# Patient Record
Sex: Female | Born: 1945 | Race: White | Hispanic: No | Smoking: Never smoker
Health system: Southern US, Community
[De-identification: ages and names within clinical notes are randomized; demographics above are authoritative.]

## PROBLEM LIST (undated history)

## (undated) DIAGNOSIS — B019 Varicella without complication: Secondary | ICD-10-CM

## (undated) DIAGNOSIS — C8290 Follicular lymphoma, unspecified, unspecified site: Secondary | ICD-10-CM

## (undated) DIAGNOSIS — I700 Atherosclerosis of aorta: Secondary | ICD-10-CM

## (undated) DIAGNOSIS — M810 Age-related osteoporosis without current pathological fracture: Secondary | ICD-10-CM

## (undated) DIAGNOSIS — Q676 Pectus excavatum: Secondary | ICD-10-CM

## (undated) DIAGNOSIS — I4892 Unspecified atrial flutter: Secondary | ICD-10-CM

## (undated) DIAGNOSIS — Z7901 Long term (current) use of anticoagulants: Secondary | ICD-10-CM

## (undated) DIAGNOSIS — K635 Polyp of colon: Secondary | ICD-10-CM

## (undated) DIAGNOSIS — Z9841 Cataract extraction status, right eye: Secondary | ICD-10-CM

## (undated) DIAGNOSIS — D6481 Anemia due to antineoplastic chemotherapy: Secondary | ICD-10-CM

## (undated) DIAGNOSIS — E43 Unspecified severe protein-calorie malnutrition: Secondary | ICD-10-CM

## (undated) DIAGNOSIS — D696 Thrombocytopenia, unspecified: Secondary | ICD-10-CM

## (undated) DIAGNOSIS — E785 Hyperlipidemia, unspecified: Secondary | ICD-10-CM

## (undated) DIAGNOSIS — J9809 Other diseases of bronchus, not elsewhere classified: Secondary | ICD-10-CM

## (undated) DIAGNOSIS — K219 Gastro-esophageal reflux disease without esophagitis: Secondary | ICD-10-CM

## (undated) DIAGNOSIS — I2510 Atherosclerotic heart disease of native coronary artery without angina pectoris: Secondary | ICD-10-CM

## (undated) DIAGNOSIS — R222 Localized swelling, mass and lump, trunk: Secondary | ICD-10-CM

## (undated) DIAGNOSIS — Z9289 Personal history of other medical treatment: Secondary | ICD-10-CM

## (undated) DIAGNOSIS — G620 Drug-induced polyneuropathy: Secondary | ICD-10-CM

## (undated) DIAGNOSIS — I4891 Unspecified atrial fibrillation: Secondary | ICD-10-CM

## (undated) DIAGNOSIS — R911 Solitary pulmonary nodule: Secondary | ICD-10-CM

## (undated) DIAGNOSIS — R0600 Dyspnea, unspecified: Secondary | ICD-10-CM

## (undated) DIAGNOSIS — I480 Paroxysmal atrial fibrillation: Secondary | ICD-10-CM

## (undated) DIAGNOSIS — M1990 Unspecified osteoarthritis, unspecified site: Secondary | ICD-10-CM

## (undated) DIAGNOSIS — C20 Malignant neoplasm of rectum: Secondary | ICD-10-CM

## (undated) DIAGNOSIS — H269 Unspecified cataract: Secondary | ICD-10-CM

## (undated) DIAGNOSIS — T451X5A Adverse effect of antineoplastic and immunosuppressive drugs, initial encounter: Secondary | ICD-10-CM

## (undated) HISTORY — DX: Unspecified osteoarthritis, unspecified site: M19.90

## (undated) HISTORY — DX: Personal history of other medical treatment: Z92.89

## (undated) HISTORY — DX: Unspecified cataract: H26.9

## (undated) HISTORY — PX: JOINT REPLACEMENT: SHX530

## (undated) HISTORY — DX: Malignant neoplasm of rectum: C20

## (undated) HISTORY — DX: Unspecified atrial flutter: I48.92

## (undated) HISTORY — DX: Atherosclerotic heart disease of native coronary artery without angina pectoris: I25.10

## (undated) HISTORY — DX: Paroxysmal atrial fibrillation: I48.0

## (undated) HISTORY — PX: TONSILLECTOMY: SUR1361

## (undated) HISTORY — PX: CATARACT EXTRACTION, BILATERAL: SHX1313

## (undated) HISTORY — PX: EYE SURGERY: SHX253

## (undated) MED FILL — Dexamethasone Sodium Phosphate Inj 100 MG/10ML: INTRAMUSCULAR | Qty: 1 | Status: AC

---

## 1990-11-05 HISTORY — PX: TOTAL HIP ARTHROPLASTY: SHX124

## 2012-05-09 DIAGNOSIS — E559 Vitamin D deficiency, unspecified: Secondary | ICD-10-CM | POA: Insufficient documentation

## 2013-03-03 DIAGNOSIS — R928 Other abnormal and inconclusive findings on diagnostic imaging of breast: Secondary | ICD-10-CM | POA: Insufficient documentation

## 2013-04-07 ENCOUNTER — Ambulatory Visit: Payer: Self-pay | Admitting: Surgery

## 2013-05-18 ENCOUNTER — Ambulatory Visit: Payer: Self-pay | Admitting: Surgery

## 2013-06-10 ENCOUNTER — Ambulatory Visit: Payer: Self-pay | Admitting: Oncology

## 2013-06-25 ENCOUNTER — Ambulatory Visit: Payer: Self-pay | Admitting: Oncology

## 2013-06-25 LAB — CBC CANCER CENTER
Basophil #: 0 x10 3/mm (ref 0.0–0.1)
Eosinophil #: 0.2 x10 3/mm (ref 0.0–0.7)
HCT: 39.5 % (ref 35.0–47.0)
HGB: 13.6 g/dL (ref 12.0–16.0)
Lymphocyte %: 23.6 %
MCH: 32 pg (ref 26.0–34.0)
MCHC: 34.5 g/dL (ref 32.0–36.0)
MCV: 93 fL (ref 80–100)

## 2013-06-25 LAB — LACTATE DEHYDROGENASE: LDH: 214 U/L (ref 81–246)

## 2013-07-06 ENCOUNTER — Ambulatory Visit: Payer: Self-pay | Admitting: Oncology

## 2013-09-25 ENCOUNTER — Ambulatory Visit: Payer: Self-pay | Admitting: Oncology

## 2013-09-28 ENCOUNTER — Ambulatory Visit: Payer: Self-pay | Admitting: Oncology

## 2013-09-28 LAB — CBC CANCER CENTER
Basophil #: 0 x10 3/mm (ref 0.0–0.1)
Basophil %: 0.5 %
Eosinophil #: 0.1 x10 3/mm (ref 0.0–0.7)
Lymphocyte %: 22.5 %
MCH: 31 pg (ref 26.0–34.0)
MCHC: 33.4 g/dL (ref 32.0–36.0)
MCV: 93 fL (ref 80–100)
Monocyte #: 0.9 x10 3/mm (ref 0.2–0.9)
Monocyte %: 10.6 %
Neutrophil #: 5.3 x10 3/mm (ref 1.4–6.5)
Neutrophil %: 65.2 %
RBC: 4.41 10*6/uL (ref 3.80–5.20)

## 2013-10-05 ENCOUNTER — Ambulatory Visit: Payer: Self-pay | Admitting: Oncology

## 2014-03-19 DIAGNOSIS — M1710 Unilateral primary osteoarthritis, unspecified knee: Secondary | ICD-10-CM | POA: Insufficient documentation

## 2014-03-19 DIAGNOSIS — E785 Hyperlipidemia, unspecified: Secondary | ICD-10-CM | POA: Insufficient documentation

## 2014-03-19 DIAGNOSIS — M179 Osteoarthritis of knee, unspecified: Secondary | ICD-10-CM | POA: Insufficient documentation

## 2014-07-05 DIAGNOSIS — M170 Bilateral primary osteoarthritis of knee: Secondary | ICD-10-CM | POA: Insufficient documentation

## 2014-11-08 DIAGNOSIS — M810 Age-related osteoporosis without current pathological fracture: Secondary | ICD-10-CM | POA: Insufficient documentation

## 2015-03-01 DIAGNOSIS — E782 Mixed hyperlipidemia: Secondary | ICD-10-CM | POA: Insufficient documentation

## 2015-03-01 DIAGNOSIS — R252 Cramp and spasm: Secondary | ICD-10-CM | POA: Insufficient documentation

## 2015-08-27 DIAGNOSIS — F5102 Adjustment insomnia: Secondary | ICD-10-CM | POA: Insufficient documentation

## 2015-12-14 DIAGNOSIS — M159 Polyosteoarthritis, unspecified: Secondary | ICD-10-CM | POA: Insufficient documentation

## 2015-12-14 DIAGNOSIS — M150 Primary generalized (osteo)arthritis: Secondary | ICD-10-CM

## 2016-06-06 DIAGNOSIS — R222 Localized swelling, mass and lump, trunk: Secondary | ICD-10-CM | POA: Insufficient documentation

## 2016-06-07 ENCOUNTER — Other Ambulatory Visit: Payer: Self-pay | Admitting: Internal Medicine

## 2016-06-07 DIAGNOSIS — R222 Localized swelling, mass and lump, trunk: Secondary | ICD-10-CM

## 2016-06-14 ENCOUNTER — Ambulatory Visit
Admission: RE | Admit: 2016-06-14 | Discharge: 2016-06-14 | Disposition: A | Discharge status: Home / Self Care | Payer: Medicare Other | Source: Ambulatory Visit | Attending: Internal Medicine | Admitting: Internal Medicine

## 2016-06-14 DIAGNOSIS — R590 Localized enlarged lymph nodes: Secondary | ICD-10-CM | POA: Diagnosis not present

## 2016-06-14 DIAGNOSIS — R222 Localized swelling, mass and lump, trunk: Secondary | ICD-10-CM | POA: Diagnosis present

## 2016-07-24 DIAGNOSIS — C8290 Follicular lymphoma, unspecified, unspecified site: Secondary | ICD-10-CM | POA: Insufficient documentation

## 2016-07-24 DIAGNOSIS — C8208 Follicular lymphoma grade I, lymph nodes of multiple sites: Secondary | ICD-10-CM | POA: Insufficient documentation

## 2016-07-24 NOTE — Progress Notes (Signed)
[place]  Telephone:(336) [phone] [phone]  ID: Veronica Boyd OB: 1945/12/17  MR#: [id number]  NOB#:[id number]  Patient Care Team: Glendon Axe, MD as PCP - General (Internal Medicine)  CHIEF COMPLAINT: Follicular lymphoma  INTERVAL HISTORY: Patient is a 70 year old female whose last evaluated in clinic radiation 3 years ago. At that point, she had increasing lymphadenopathy and a biopsy was recommended. Patient refused at that time and was lost to follow-up. Recently she noted increased swelling surrounding her right collarbone and subsequent biopsy was suggestive of follicular lymphoma, but not definitive. She currently feels well and is asymptomatic. She has no neurologic complaints. She denies any fevers, night sweats, or weight loss. She has no chest pain or shortness of breath. She denies any nausea, vomiting, constipation, or diarrhea. She has no urinary complaint. Patient otherwise feels well and offers no further specific complaints.  REVIEW OF SYSTEMS:   Review of Systems  Constitutional: Negative.  Negative for fever, malaise/fatigue and weight loss.  Respiratory: Negative.  Negative for cough and shortness of breath.   Cardiovascular: Negative.  Negative for chest pain and leg swelling.  Gastrointestinal: Negative.  Negative for abdominal pain.  Genitourinary: Negative.   Musculoskeletal: Negative.   Neurological: Negative.   Psychiatric/Behavioral: Negative.  The patient is not nervous/anxious.     As per HPI. Otherwise, a complete review of systems is negative.  PAST MEDICAL HISTORY: Past Medical History:  Diagnosis Date  . Arthritis   . Cataract     PAST SURGICAL HISTORY: History reviewed. No pertinent surgical history.  FAMILY HISTORY: Family History  Problem Relation Age of Onset  . Diabetes Sister   . Lung cancer Brother   . Diabetes Brother   . Basal cell carcinoma Daughter   . Breast cancer Paternal Aunt      ADVANCED DIRECTIVES (Y/N):  N  HEALTH MAINTENANCE: Social History  Substance Use Topics  . Smoking status: Never Smoker  . Smokeless tobacco: Never Used  . Alcohol use Not on file     Colonoscopy:  PAP:  Bone density:  Lipid panel:  No Known Allergies  Current Outpatient Prescriptions  Medication Sig Dispense Refill  . meloxicam (MOBIC) 15 MG tablet Take 1 tablet by mouth daily.    . pantoprazole (PROTONIX) 20 MG tablet Take 1 tablet by mouth daily.    . simvastatin (ZOCOR) 20 MG tablet Take 1 tablet by mouth daily.    Marland Kitchen tiZANidine (ZANAFLEX) 2 MG tablet Take 1 tablet by mouth daily.    . traMADol-acetaminophen (ULTRACET) 37.5-325 MG tablet Take 1 tablet by mouth 2 (two) times daily as needed.     No current facility-administered medications for this visit.     OBJECTIVE: Vitals:   07/26/16 0847  BP: 129/83  Pulse: 73  Resp: 18  Temp: 97.3 F (36.3 C)     Body mass index is 24.13 kg/m.    ECOG FS:0 - Asymptomatic  General: Well-developed, well-nourished, no acute distress. Eyes: Pink conjunctiva, anicteric sclera. HEENT: Normocephalic, moist mucous membranes, clear oropharnyx. Lungs: Clear to auscultation bilaterally. Heart: Regular rate and rhythm. No rubs, murmurs, or gallops. Abdomen: Soft, nontender, nondistended. No organomegaly noted, normoactive bowel sounds. Musculoskeletal: No edema, cyanosis, or clubbing. Neuro: Alert, answering all questions appropriately. Cranial nerves grossly intact. Skin: No rashes or petechiae noted. Psych: Normal affect. Lymphatics: Easily palpable right supraclavicular lymphadenopathy.   LAB RESULTS:  No results found for: NA, K, CL, CO2, GLUCOSE, BUN, CREATININE, CALCIUM, PROT, ALBUMIN, AST, ALT, ALKPHOS,  Prudence Davidson, GFRAA  Lab Results  Component Value Date   WBC 8.1 09/28/2013   NEUTROABS 5.3 09/28/2013   HGB 13.7 09/28/2013   HCT 41.0 09/28/2013   MCV 93 09/28/2013   PLT 229 09/28/2013      STUDIES: No results found.  ASSESSMENT: Follicular lymphoma  PLAN:    1. Follicular lymphoma:  Case discussed with pathology, there was not enough tissue therefore biopsy was only suggestive of follicular lymphoma and not definitive. No grade could be assigned. Given the indolent nature of her lymphadenopathy, the clinical diagnosis is likely accurate. We will likely need additional tissue with excisional biopsy in the near future. Will get a PET scan in the next 1-2 weeks to complete the staging workup. Patient has already refusing bone marrow biopsy. Return to clinic one to 2 days after her PET scan to discuss possible rebiopsy and treatment planning if necessary.  Approximately 30 minutes was spent in discussion of which greater than 50% was consultation.  Patient expressed understanding and was in agreement with this plan. She also understands that She can call clinic at any time with any questions, concerns, or complaints.   No matching staging information was found for the patient.  Lloyd Huger, MD   07/26/2016 9:50 AM

## 2016-07-26 ENCOUNTER — Encounter: Payer: Self-pay | Admitting: Oncology

## 2016-07-26 ENCOUNTER — Inpatient Hospital Stay: Payer: Medicare Other | Attending: Oncology | Admitting: Oncology

## 2016-07-26 ENCOUNTER — Encounter (INDEPENDENT_AMBULATORY_CARE_PROVIDER_SITE_OTHER): Payer: Self-pay

## 2016-07-26 ENCOUNTER — Inpatient Hospital Stay: Payer: Medicare Other

## 2016-07-26 VITALS — BP 129/83 | HR 73 | Temp 97.3°F | Resp 18 | Ht 59.0 in | Wt 119.5 lb

## 2016-07-26 DIAGNOSIS — Z801 Family history of malignant neoplasm of trachea, bronchus and lung: Secondary | ICD-10-CM | POA: Diagnosis not present

## 2016-07-26 DIAGNOSIS — Z79899 Other long term (current) drug therapy: Secondary | ICD-10-CM

## 2016-07-26 DIAGNOSIS — M129 Arthropathy, unspecified: Secondary | ICD-10-CM | POA: Diagnosis not present

## 2016-07-26 DIAGNOSIS — C8290 Follicular lymphoma, unspecified, unspecified site: Secondary | ICD-10-CM | POA: Diagnosis present

## 2016-07-26 DIAGNOSIS — Z803 Family history of malignant neoplasm of breast: Secondary | ICD-10-CM | POA: Diagnosis not present

## 2016-07-26 DIAGNOSIS — Z808 Family history of malignant neoplasm of other organs or systems: Secondary | ICD-10-CM | POA: Diagnosis not present

## 2016-07-26 LAB — COMPREHENSIVE METABOLIC PANEL
ALK PHOS: 61 U/L (ref 38–126)
ALT: 17 U/L (ref 14–54)
AST: 22 U/L (ref 15–41)
Albumin: 4.6 g/dL (ref 3.5–5.0)
Anion gap: 8 (ref 5–15)
BUN: 20 mg/dL (ref 6–20)
CHLORIDE: 103 mmol/L (ref 101–111)
CO2: 25 mmol/L (ref 22–32)
Calcium: 9.4 mg/dL (ref 8.9–10.3)
Creatinine, Ser: 0.81 mg/dL (ref 0.44–1.00)
Glucose, Bld: 95 mg/dL (ref 65–99)
Potassium: 4 mmol/L (ref 3.5–5.1)
Sodium: 136 mmol/L (ref 135–145)
Total Bilirubin: 0.6 mg/dL (ref 0.3–1.2)
Total Protein: 7.2 g/dL (ref 6.5–8.1)

## 2016-07-26 LAB — CBC WITH DIFFERENTIAL/PLATELET
BASOS ABS: 0.1 10*3/uL (ref 0–0.1)
Basophils Relative: 1 %
Eosinophils Absolute: 0.3 10*3/uL (ref 0–0.7)
Eosinophils Relative: 6 %
HCT: 43.8 % (ref 35.0–47.0)
HEMOGLOBIN: 15.1 g/dL (ref 12.0–16.0)
LYMPHS ABS: 1.3 10*3/uL (ref 1.0–3.6)
LYMPHS PCT: 22 %
MCH: 31.2 pg (ref 26.0–34.0)
MCHC: 34.4 g/dL (ref 32.0–36.0)
MCV: 90.6 fL (ref 80.0–100.0)
Monocytes Absolute: 0.7 10*3/uL (ref 0.2–0.9)
Monocytes Relative: 12 %
NEUTROS ABS: 3.6 10*3/uL (ref 1.4–6.5)
NEUTROS PCT: 59 %
Platelets: 210 10*3/uL (ref 150–440)
RBC: 4.83 MIL/uL (ref 3.80–5.20)
RDW: 12.7 % (ref 11.5–14.5)
WBC: 6.1 10*3/uL (ref 3.6–11.0)

## 2016-07-26 LAB — LACTATE DEHYDROGENASE: LDH: 168 U/L (ref 98–192)

## 2016-07-26 NOTE — Progress Notes (Signed)
New evaluation from Veronica Boyd for lymphoma. States has mild back pain that started this morning.

## 2016-07-27 LAB — HEPATITIS B SURFACE ANTIBODY, QUANTITATIVE

## 2016-07-27 LAB — HEPATITIS B SURFACE ANTIGEN: HEP B S AG: NEGATIVE

## 2016-07-31 LAB — COMP PANEL: LEUKEMIA/LYMPHOMA

## 2016-08-01 ENCOUNTER — Encounter
Admission: RE | Admit: 2016-08-01 | Discharge: 2016-08-01 | Disposition: A | Discharge status: Home / Self Care | Payer: Medicare Other | Source: Ambulatory Visit | Attending: Oncology | Admitting: Oncology

## 2016-08-01 DIAGNOSIS — C8290 Follicular lymphoma, unspecified, unspecified site: Secondary | ICD-10-CM | POA: Diagnosis present

## 2016-08-01 LAB — GLUCOSE, CAPILLARY: Glucose-Capillary: 92 mg/dL (ref 65–99)

## 2016-08-01 MED ORDER — FLUDEOXYGLUCOSE F - 18 (FDG) INJECTION
12.1200 | Freq: Once | INTRAVENOUS | Status: AC | PRN
  Administered 2016-08-01: 12.12 via INTRAVENOUS

## 2016-08-05 DIAGNOSIS — C8290 Follicular lymphoma, unspecified, unspecified site: Secondary | ICD-10-CM

## 2016-08-05 HISTORY — DX: Follicular lymphoma, unspecified, unspecified site: C82.90

## 2016-08-05 NOTE — Progress Notes (Signed)
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 She isswelling surrounding her right clavicle has increased. She is anxious, but otherwise feels well.  She has no neurologic complaints. She denies any fevers, night sweats, or weight loss. She has no chest pain or shortness of breath. She denies any nausea, vomiting, constipation, or diarrhea. She has no urinary complaint. Patient offers no further specific complaints today.  REVIEW OF SYSTEMS:   Review of Systems  Constitutional: Negative.  Negative for fever, malaise/fatigue and weight loss.  Respiratory: Negative.  Negative for cough and shortness of breath.   Cardiovascular: Negative.  Negative for chest pain and leg swelling.  Gastrointestinal: Negative.  Negative for abdominal pain.  Genitourinary: Negative.   Musculoskeletal: Negative.   Neurological: Negative.   Psychiatric/Behavioral: Negative.  The patient is not nervous/anxious.     As per HPI. Otherwise, a complete review of systems is negative.  PAST MEDICAL HISTORY: Past Medical History:  Diagnosis Date  . Arthritis   . Cataract     PAST SURGICAL HISTORY: No past surgical history on file.  FAMILY HISTORY: Family History  Problem Relation Age of Onset  . Diabetes Sister   . Lung cancer Brother   . Diabetes Brother   . Basal cell carcinoma Daughter   . Breast cancer Paternal Aunt     ADVANCED DIRECTIVES (Y/N):  N  HEALTH MAINTENANCE: Social History  Substance Use Topics  . Smoking status: Never Smoker  . Smokeless tobacco: Never Used  . Alcohol use Not on  file     Colonoscopy:  PAP:  Bone density:  Lipid panel:  No Known Allergies  Current Outpatient Prescriptions  Medication Sig Dispense Refill  . meloxicam (MOBIC) 15 MG tablet Take 1 tablet by mouth daily.    . ondansetron (ZOFRAN) 4 MG tablet Take 1 tablet by mouth every 8 (eight) hours as needed.    . pantoprazole (PROTONIX) 20 MG tablet Take 1 tablet by mouth daily.    . simvastatin (ZOCOR) 20 MG tablet Take 1 tablet by mouth daily.    Marland Kitchen tiZANidine (ZANAFLEX) 2 MG tablet Take 1 tablet by mouth daily.    . traMADol-acetaminophen (ULTRACET) 37.5-325 MG tablet Take 1 tablet by mouth 2 (two) times daily as needed.     No current facility-administered medications for this visit.     OBJECTIVE: Vitals:   08/06/16 0911  BP: (!) 150/75  Pulse: 78  Resp: 18  Temp: 97.5 F (36.4 C)     Body mass index is 23.82 kg/m.    ECOG FS:0 - Asymptomatic  General: Well-developed, well-nourished, no acute distress. Eyes: Pink conjunctiva, anicteric sclera. Lungs: Clear to auscultation bilaterally. Heart: Regular rate and rhythm. No rubs, murmurs, or gallops. Abdomen: Soft, nontender, nondistended. No organomegaly noted, normoactive bowel sounds. Musculoskeletal: No edema, cyanosis, or clubbing. Neuro: Alert, answering all questions appropriately. Cranial nerves grossly intact. Skin: No rashes or petechiae noted. Psych: Normal affect. Lymphatics: Easily palpable right supraclavicular lymphadenopathy, increased in size.   LAB RESULTS:  Lab Results  Component Value Date   NA 136 07/26/2016   K 4.0 07/26/2016   CL 103 07/26/2016  CO2 25 07/26/2016   GLUCOSE 95 07/26/2016   BUN 20 07/26/2016   CREATININE 0.81 07/26/2016   CALCIUM 9.4 07/26/2016   PROT 7.2 07/26/2016   ALBUMIN 4.6 07/26/2016   AST 22 07/26/2016   ALT 17 07/26/2016   ALKPHOS 61 07/26/2016   BILITOT 0.6 07/26/2016   GFRNONAA >60 07/26/2016   GFRAA >60 07/26/2016    Lab Results  Component Value Date   WBC  6.1 07/26/2016   NEUTROABS 3.6 07/26/2016   HGB 15.1 07/26/2016   HCT 43.8 07/26/2016   MCV 90.6 07/26/2016   PLT 210 07/26/2016     STUDIES: Nm Pet Image Initial (pi) Skull Base To Thigh  Result Date: 08/01/2016 CLINICAL DATA:  Initial treatment strategy for follicular lymphoma. EXAM: NUCLEAR MEDICINE PET SKULL BASE TO THIGH TECHNIQUE: 12.1 cm mCi F-18 FDG was injected intravenously. Full-ring PET imaging was performed from the skull base to thigh after the radiotracer. CT data was obtained and used for attenuation correction and anatomic localization. FASTING BLOOD GLUCOSE:  Value: 92 mg/dl COMPARISON:  Chest CT on 06/14/2016 FINDINGS: NECK 10 mm hypermetabolic right supraclavicular lymph node, with SUV max of 4.6. No other hypermetabolic lymph nodes within the neck. CHEST 7 mm right internal mammary chain lymph node is hypermetabolic, with SUV max of 3.7. 7 mm right paratracheal lymph node is hypermetabolic, with SUV max of 3.8. No hypermetabolic hilar lymph nodes identified. Hypermetabolic lymphadenopathy is seen in the bilateral subpectoral regions, right side greater than left. Largest index lymph node mass in right subpectoral region measures 2.6 x 6.8 cm on image 71/4, and has SUV max of 17.3. Mild hypermetabolic lymphadenopathy also seen in both axillary regions. No suspicious pulmonary nodules seen on CT images. No evidence of pleural effusion. Mild LAD coronary artery calcification noted as well as aortic atherosclerosis. ABDOMEN/PELVIS No abnormal hypermetabolic activity within the liver, pancreas, adrenal glands, or spleen. No hypermetabolic lymph nodes in the abdomen or pelvis. Aortic atherosclerosis noted. 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 SKELETON No focal hypermetabolic activity to suggest skeletal metastasis. IMPRESSION: Hypermetabolic adenopathy in bilateral subpectoral and axillary regions, right internal mammary chain, and right  paratracheal and supraclavicular regions. No evidence of hypermetabolic lymphadenopathy within the abdomen or pelvis. Electronically Signed   By: Earle Gell M.D.   On: 08/01/2016 10:42    ASSESSMENT: At least stage II Follicular lymphoma  PLAN:    1. At least stage II Follicular lymphoma:  PET scan results reviewed independently and reported as above consistent with stage II lymphoma. Previously case was discussed with pathology, there was not enough tissue therefore biopsy was only suggestive of follicular lymphoma and not definitive. No grade could be assigned. Given the indolent nature of her lymphadenopathy, the clinical diagnosis is likely accurate. Patient has been referred back to surgery for consideration of excisional biopsy. Given the fact that her lymphadenopathy has increased in size and she is stage II, we will need to confirm the diagnosis since she will likely need treatment in the near future, possibly with single agent Rituxan. Return to clinic one week after her biopsy to discuss the results and treatment planning if necessary.   Approximately 30 minutes was spent in discussion of which greater than 50% was consultation.  Patient expressed understanding and was in agreement with this plan. She also understands that She can call clinic at any time with any questions, concerns, or complaints.   No matching staging information was found for the  patient.  Lloyd Huger, MD   08/07/2016 9:02 AM

## 2016-08-06 ENCOUNTER — Encounter: Payer: Self-pay | Admitting: *Deleted

## 2016-08-06 ENCOUNTER — Telehealth: Payer: Self-pay | Admitting: *Deleted

## 2016-08-06 ENCOUNTER — Inpatient Hospital Stay: Payer: Medicare Other | Attending: Oncology | Admitting: Oncology

## 2016-08-06 VITALS — BP 150/75 | HR 78 | Temp 97.5°F | Resp 18 | Wt 117.9 lb

## 2016-08-06 DIAGNOSIS — Z808 Family history of malignant neoplasm of other organs or systems: Secondary | ICD-10-CM

## 2016-08-06 DIAGNOSIS — Z79899 Other long term (current) drug therapy: Secondary | ICD-10-CM

## 2016-08-06 DIAGNOSIS — C8290 Follicular lymphoma, unspecified, unspecified site: Secondary | ICD-10-CM | POA: Diagnosis present

## 2016-08-06 DIAGNOSIS — R599 Enlarged lymph nodes, unspecified: Secondary | ICD-10-CM

## 2016-08-06 DIAGNOSIS — I700 Atherosclerosis of aorta: Secondary | ICD-10-CM

## 2016-08-06 DIAGNOSIS — Z801 Family history of malignant neoplasm of trachea, bronchus and lung: Secondary | ICD-10-CM

## 2016-08-06 DIAGNOSIS — E785 Hyperlipidemia, unspecified: Secondary | ICD-10-CM | POA: Diagnosis not present

## 2016-08-06 DIAGNOSIS — Z803 Family history of malignant neoplasm of breast: Secondary | ICD-10-CM | POA: Diagnosis not present

## 2016-08-06 DIAGNOSIS — I2510 Atherosclerotic heart disease of native coronary artery without angina pectoris: Secondary | ICD-10-CM

## 2016-08-06 DIAGNOSIS — M129 Arthropathy, unspecified: Secondary | ICD-10-CM | POA: Diagnosis not present

## 2016-08-06 NOTE — Patient Instructions (Signed)
Spoke with Dr. Lavone Neri Smith's office regarding patient being scheduled for excisional biopsy, office states patient can be scheduled for procedure Thursday 10/5. Dr. Thompson Caul office will call patient with appointment details.

## 2016-08-06 NOTE — Telephone Encounter (Signed)
Per Grayland Ormond, ok to move FU appt. Please call Daughter with new appt

## 2016-08-06 NOTE — Telephone Encounter (Signed)
Called to report that we need to move her appt on 10/12 to around the 24 th. Plan is to possibly do bx in  Office on the 12 th and if not in office then it will be on the 17 th, results  Will take 3 - 5 business days to get back.

## 2016-08-06 NOTE — Progress Notes (Signed)
States is nervous about results today. Has been feeling nauseated for the past few weeks. Dr. Candiss Norse prescribed ondansetron to take for nausea but pt states did not relieve her symptoms. Pt's daughter had zofran ODT which helped relieve nausea.

## 2016-08-07 ENCOUNTER — Telehealth: Payer: Self-pay | Admitting: *Deleted

## 2016-08-07 ENCOUNTER — Other Ambulatory Visit: Payer: Self-pay | Admitting: Oncology

## 2016-08-07 MED ORDER — ONDANSETRON 8 MG PO TBDP: 8.0000 mg | ORAL_TABLET | Freq: Three times a day (TID) | ORAL | 2 refills | Status: DC | PRN

## 2016-08-07 NOTE — Telephone Encounter (Signed)
Called to get pharmacy to send rx to [facility] in [place]

## 2016-08-13 ENCOUNTER — Encounter
Admission: RE | Admit: 2016-08-13 | Discharge: 2016-08-13 | Disposition: A | Discharge status: Home / Self Care | Payer: Medicare Other | Source: Ambulatory Visit | Attending: Surgery | Admitting: Surgery

## 2016-08-13 ENCOUNTER — Encounter: Payer: Self-pay | Admitting: *Deleted

## 2016-08-13 DIAGNOSIS — Z01818 Encounter for other preprocedural examination: Secondary | ICD-10-CM | POA: Diagnosis not present

## 2016-08-13 HISTORY — DX: Hyperlipidemia, unspecified: E78.5

## 2016-08-13 HISTORY — DX: Age-related osteoporosis without current pathological fracture: M81.0

## 2016-08-13 HISTORY — DX: Varicella without complication: B01.9

## 2016-08-13 HISTORY — DX: Polyp of colon: K63.5

## 2016-08-13 NOTE — Pre-Procedure Instructions (Signed)
EKG reviewed by Dr. Andree Elk; no clearance indicated at this time.

## 2016-08-13 NOTE — Patient Instructions (Signed)
  Your procedure is scheduled on: Tuesday, August 21, 2016 Report to Same Day Surgery 2nd floor medical mall To find out your arrival time please call [phone] between 1PM - 3PM on Monday, October 16th   Remember: Instructions that are not followed completely may result in serious medical risk, up to and including death, or upon the discretion of your surgeon and anesthesiologist your surgery may need to be rescheduled.    _x___ 1. Do not eat food or drink liquids after midnight. No gum chewing or hard candies.     __x__ 2. No Alcohol for 24 hours before or after surgery.   __x__3. No Smoking for 24 prior to surgery.   ____  4. Bring all medications with you on the day of surgery if instructed.    __x__ 5. Notify your doctor if there is any change in your medical condition     (cold, fever, infections).     Do not wear jewelry, make-up, hairpins, clips or nail polish.  Do not wear lotions, powders, or perfumes. You may wear deodorant.  Do not shave 48 hours prior to surgery. Men may shave face and neck.  Do not bring valuables to the hospital.    [facility] is not responsible for any belongings or valuables.               Contacts, dentures or bridgework may not be worn into surgery.  Leave your suitcase in the car. After surgery it may be brought to your room.  For patients admitted to the hospital, discharge time is determined by your treatment team.   Patients discharged the day of surgery will not be allowed to drive home.    Please read over the following fact sheets that you were given:   [facility] Preparing for Surgery and or MRSA Information   _x___ Take these medicines the morning of surgery with A SIP OF WATER:    1. Protonix (take one the night before and one the morning of surgery)  2.  3.  4.  5.  6.  ____Fleets enema or Magnesium Citrate as directed.   _x___ Use CHG Soap or sage wipes as directed on instruction sheet   ____ Use inhalers on the day  of surgery and bring to hospital day of surgery  ____ Stop metformin 2 days prior to surgery    ____ Take 1/2 of usual insulin dose the night before surgery and none on the morning of           surgery.   ____ Stop aspirin or coumadin, or plavix  x__ Stop Anti-inflammatories such as Advil, Aleve, Ibuprofen, Motrin, Naproxen, MELOXICAM (STOP TONIGHT)          Naprosyn, Goodies powders or aspirin products. Ok to take Tylenol.   ____ Stop supplements until after surgery.    ____ Bring C-Pap to the hospital.

## 2016-08-16 ENCOUNTER — Ambulatory Visit: Payer: Medicare Other | Admitting: Oncology

## 2016-08-21 ENCOUNTER — Ambulatory Visit
Admission: RE | Admit: 2016-08-21 | Discharge: 2016-08-21 | Disposition: A | Discharge status: Home / Self Care | Payer: Medicare Other | Source: Ambulatory Visit | Attending: Surgery | Admitting: Surgery

## 2016-08-21 ENCOUNTER — Inpatient Hospital Stay: Payer: Medicare Other | Admitting: Anesthesiology

## 2016-08-21 ENCOUNTER — Inpatient Hospital Stay: Payer: Medicare Other | Admitting: Certified Registered"

## 2016-08-21 ENCOUNTER — Encounter
Admission: RE | Disposition: A | Discharge status: Home / Self Care | Payer: Self-pay | Source: Ambulatory Visit | Attending: Surgery

## 2016-08-21 ENCOUNTER — Encounter: Payer: Self-pay | Admitting: *Deleted

## 2016-08-21 DIAGNOSIS — Z96642 Presence of left artificial hip joint: Secondary | ICD-10-CM | POA: Insufficient documentation

## 2016-08-21 DIAGNOSIS — C8214 Follicular lymphoma grade II, lymph nodes of axilla and upper limb: Secondary | ICD-10-CM | POA: Diagnosis not present

## 2016-08-21 DIAGNOSIS — Z7983 Long term (current) use of bisphosphonates: Secondary | ICD-10-CM | POA: Diagnosis not present

## 2016-08-21 DIAGNOSIS — Z79899 Other long term (current) drug therapy: Secondary | ICD-10-CM | POA: Insufficient documentation

## 2016-08-21 DIAGNOSIS — R599 Enlarged lymph nodes, unspecified: Secondary | ICD-10-CM | POA: Diagnosis present

## 2016-08-21 DIAGNOSIS — Z791 Long term (current) use of non-steroidal anti-inflammatories (NSAID): Secondary | ICD-10-CM | POA: Insufficient documentation

## 2016-08-21 DIAGNOSIS — M810 Age-related osteoporosis without current pathological fracture: Secondary | ICD-10-CM | POA: Insufficient documentation

## 2016-08-21 HISTORY — PX: AXILLARY LYMPH NODE DISSECTION: SHX5229

## 2016-08-21 SURGERY — LYMPHADENECTOMY, AXILLARY
Anesthesia: General | Laterality: Right

## 2016-08-21 MED ORDER — LIDOCAINE HCL (CARDIAC) 20 MG/ML IV SOLN
INTRAVENOUS | Status: DC | PRN
  Administered 2016-08-21: 60 mg via INTRAVENOUS

## 2016-08-21 MED ORDER — FAMOTIDINE 20 MG PO TABS
20.0000 mg | ORAL_TABLET | Freq: Once | ORAL | Status: AC
  Administered 2016-08-21: 20 mg via ORAL

## 2016-08-21 MED ORDER — PROPOFOL 10 MG/ML IV BOLUS
INTRAVENOUS | Status: DC | PRN
  Administered 2016-08-21: 100 mg via INTRAVENOUS

## 2016-08-21 MED ORDER — HYDROCODONE-ACETAMINOPHEN 5-325 MG PO TABS
ORAL_TABLET | ORAL | Status: DC
Start: 2016-08-21 — End: 2016-08-21
  Filled 2016-08-21: qty 1

## 2016-08-21 MED ORDER — FENTANYL CITRATE (PF) 100 MCG/2ML IJ SOLN
INTRAMUSCULAR | Status: DC | PRN
Start: 2016-08-21 — End: 2016-08-21
  Administered 2016-08-21: 50 ug via INTRAVENOUS
  Administered 2016-08-21 (×2): 25 ug via INTRAVENOUS
  Administered 2016-08-21: 50 ug via INTRAVENOUS

## 2016-08-21 MED ORDER — FENTANYL CITRATE (PF) 100 MCG/2ML IJ SOLN
INTRAMUSCULAR | Status: AC
  Administered 2016-08-21: 25 ug via INTRAVENOUS
  Filled 2016-08-21: qty 2

## 2016-08-21 MED ORDER — FAMOTIDINE 20 MG PO TABS
ORAL_TABLET | ORAL | Status: AC
  Filled 2016-08-21: qty 1

## 2016-08-21 MED ORDER — ONDANSETRON HCL 4 MG/2ML IJ SOLN: 4.0000 mg | Freq: Once | INTRAMUSCULAR | Status: DC | PRN

## 2016-08-21 MED ORDER — HYDROCODONE-ACETAMINOPHEN 5-325 MG PO TABS
1.0000 | ORAL_TABLET | ORAL | Status: DC | PRN
  Administered 2016-08-21: 1 via ORAL

## 2016-08-21 MED ORDER — EPHEDRINE SULFATE 50 MG/ML IJ SOLN
INTRAMUSCULAR | Status: DC | PRN
  Administered 2016-08-21: 5 mg via INTRAVENOUS

## 2016-08-21 MED ORDER — FENTANYL CITRATE (PF) 100 MCG/2ML IJ SOLN
25.0000 ug | INTRAMUSCULAR | Status: AC | PRN
  Administered 2016-08-21 (×6): 25 ug via INTRAVENOUS

## 2016-08-21 MED ORDER — HYDROCODONE-ACETAMINOPHEN 5-325 MG PO TABS: 1.0000 | ORAL_TABLET | ORAL | 0 refills | Status: DC | PRN

## 2016-08-21 MED ORDER — BUPIVACAINE-EPINEPHRINE (PF) 0.5% -1:200000 IJ SOLN
INTRAMUSCULAR | Status: AC
  Filled 2016-08-21: qty 30

## 2016-08-21 MED ORDER — DEXAMETHASONE SODIUM PHOSPHATE 10 MG/ML IJ SOLN
INTRAMUSCULAR | Status: DC | PRN
  Administered 2016-08-21: 4 mg via INTRAVENOUS

## 2016-08-21 MED ORDER — ONDANSETRON HCL 4 MG/2ML IJ SOLN
INTRAMUSCULAR | Status: DC | PRN
  Administered 2016-08-21: 4 mg via INTRAVENOUS

## 2016-08-21 MED ORDER — LACTATED RINGERS IV SOLN
INTRAVENOUS | Status: DC
  Administered 2016-08-21: 07:00:00 via INTRAVENOUS

## 2016-08-21 MED ORDER — BUPIVACAINE-EPINEPHRINE 0.5% -1:200000 IJ SOLN
INTRAMUSCULAR | Status: DC | PRN
  Administered 2016-08-21: 7 mL

## 2016-08-21 SURGICAL SUPPLY — 27 items
BLADE SURG 15 STRL LF DISP TIS (BLADE) ×2 IMPLANT
BLADE SURG 15 STRL SS (BLADE) ×2
BULB RESERV EVAC DRAIN JP 100C (MISCELLANEOUS) IMPLANT
CANISTER SUCT 1200ML W/VALVE (MISCELLANEOUS) ×2 IMPLANT
CHLORAPREP W/TINT 26ML (MISCELLANEOUS) ×2 IMPLANT
CNTNR SPEC 2.5X3XGRAD LEK (MISCELLANEOUS) ×2
CONT SPEC 4OZ STER OR WHT (MISCELLANEOUS) ×2
CONTAINER SPEC 2.5X3XGRAD LEK (MISCELLANEOUS) ×2 IMPLANT
DRAIN CHANNEL JP 15F RND 16 (MISCELLANEOUS) IMPLANT
DRAPE LAPAROTOMY 100X77 ABD (DRAPES) ×2 IMPLANT
ELECT REM PT RETURN 9FT ADLT (ELECTROSURGICAL) ×2
ELECTRODE REM PT RTRN 9FT ADLT (ELECTROSURGICAL) ×1 IMPLANT
GAUZE SPONGE 4X4 12PLY STRL (GAUZE/BANDAGES/DRESSINGS) IMPLANT
GLOVE BIO SURGEON STRL SZ7.5 (GLOVE) ×6 IMPLANT
GOWN STRL REUS W/ TWL LRG LVL3 (GOWN DISPOSABLE) ×2 IMPLANT
GOWN STRL REUS W/TWL LRG LVL3 (GOWN DISPOSABLE) ×2
HARMONIC SCALPEL FOCUS (MISCELLANEOUS) IMPLANT
KIT RM TURNOVER STRD PROC AR (KITS) ×2 IMPLANT
LABEL OR SOLS (LABEL) ×2 IMPLANT
LIQUID BAND (GAUZE/BANDAGES/DRESSINGS) ×2 IMPLANT
PACK BASIN MINOR ARMC (MISCELLANEOUS) ×2 IMPLANT
SUT CHROMIC 4 0 RB 1X27 (SUTURE) ×2 IMPLANT
SUT ETH BLK MONO 3 0 FS 1 12/B (SUTURE) ×2 IMPLANT
SUT ETHILON 4-0 (SUTURE) ×1
SUT ETHILON 4-0 FS2 18XMFL BLK (SUTURE) ×1
SUTURE ETHLN 4-0 FS2 18XMF BLK (SUTURE) ×1 IMPLANT
WATER STERILE IRR 1000ML POUR (IV SOLUTION) ×2 IMPLANT

## 2016-08-21 NOTE — H&P (Signed)
  She reports no change in condition since office exam  I discussed the plan for removal right axillary node.  Right side marked YES

## 2016-08-21 NOTE — Transfer of Care (Signed)
Immediate Anesthesia Transfer of Care Note  Patient: Veronica Boyd  Procedure(s) Performed: Procedure(s): AXILLARY LYMPH NODE DISSECTION (Right)  Patient Location: PACU  Anesthesia Type:General  Level of Consciousness: awake and responds to stimulation  Airway & Oxygen Therapy: Patient Spontanous Breathing and Patient connected to face mask oxygen  Post-op Assessment: Report given to RN and Post -op Vital signs reviewed and stable  Post vital signs: Reviewed and stable  Last Vitals:  Vitals:   08/21/16 0835 08/21/16 0836  BP: (!) 110/59 (!) 110/59  Pulse: 77 77  Resp: 13 13  Temp: 36.2 C     Last Pain:  Vitals:   08/21/16 0621  TempSrc: Tympanic         Complications: No apparent anesthesia complications

## 2016-08-21 NOTE — Discharge Instructions (Addendum)
AMBULATORY SURGERY  DISCHARGE INSTRUCTIONS   1) The drugs that you were given will stay in your system until tomorrow so for the next 24 hours you should not:  A) Drive an automobile B) Make any legal decisions C) Drink any alcoholic beverage   2) You may resume regular meals tomorrow.  Today it is better to start with liquids and gradually work up to solid foods.  You may eat anything you prefer, but it is better to start with liquids, then soup and crackers, and gradually work up to solid foods.   3) Please notify your doctor immediately if you have any unusual bleeding, trouble breathing, redness and pain at the surgery site, drainage, fever, or pain not relieved by medication.    4) Additional Instructions:        Please contact your physician with any problems or Same Day Surgery at [phone], Monday through Friday 6 am to 4 pm, or [place] at [facility] number at [phone].Take Tylenol or Norco if needed for pain.  Should not drive when taking Norco.  Should not take Norco at the same time as tramadol.  May shower.

## 2016-08-21 NOTE — Anesthesia Preprocedure Evaluation (Signed)
Anesthesia Evaluation  Patient identified by MRN, date of birth, ID band Patient awake    Reviewed: Allergy & Precautions, NPO status , Patient's Chart, lab work & pertinent test results  Airway Mallampati: III  TM Distance: <3 FB   Mouth opening: Limited Mouth Opening  Dental  (+) Chipped, Caps   Pulmonary neg pulmonary ROS,    Pulmonary exam normal        Cardiovascular negative cardio ROS Normal cardiovascular exam     Neuro/Psych negative neurological ROS  negative psych ROS   GI/Hepatic negative GI ROS, Neg liver ROS,   Endo/Other    Renal/GU      Musculoskeletal  (+) Arthritis , Osteoarthritis,    Abdominal Normal abdominal exam  (+)   Peds negative pediatric ROS (+)  Hematology   Anesthesia Other Findings   Reproductive/Obstetrics                             Anesthesia Physical Anesthesia Plan  ASA: II  Anesthesia Plan: General   Post-op Pain Management:    Induction: Intravenous  Airway Management Planned: LMA  Additional Equipment:   Intra-op Plan:   Post-operative Plan: Extubation in OR  Informed Consent: I have reviewed the patients History and Physical, chart, labs and discussed the procedure including the risks, benefits and alternatives for the proposed anesthesia with the patient or authorized representative who has indicated his/her understanding and acceptance.   Dental advisory given  Plan Discussed with: CRNA and Surgeon  Anesthesia Plan Comments:         Anesthesia Quick Evaluation

## 2016-08-21 NOTE — Op Note (Signed)
OPERATIVE REPORT  PREOPERATIVE  DIAGNOSIS: . Right subclavian mass with bilateral axillary adenopathy  POSTOPERATIVE DIAGNOSIS: . 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 Excision right axillary lymph node  ANESTHESIA:  General  SURGEON: Rochel Brome  MD   INDICATIONS: She recently developed a mass in the right subclavian area. Needle biopsy is suspicious for follicular lymphoma. Additional biopsy was recommended for further evaluation.  With the patient on the operating table in the supine position she was placed under general anesthesia. The right arm was placed on a lateral arm support. The right axilla chest wall and upper arm were prepared with ChloraPrep and draped in a sterile manner.  The mass was easily palpable in the right subclavian area. There was also palpable axillary adenopathy. An oblique incision was made in the inferior aspect of the right axilla approximate 5 cm in length and carried down through subcutaneous tissues. Several small bleeding points were cauterized. The pectoralis major muscle was retracted. There was a palpable firm lymph node which was dissected free from surrounding structures. Several small bleeding points were cauterized. The lymph node was approximately 8 mm in dimension and was firm and rounded and was submitted for imprint and pathology. Another adjacent lymph node approximately 7 mm in dimension was also resected and submitted for pathology.  During the course of the operation the pathologist did called to report that the imprint demonstrated diagnostic tissue suspicious for lymphoma.  The wound was inspected and could see hemostasis was intact. Subcutaneous tissues were infiltrated with half percent Sensorcaine with epinephrine. Subcutaneous tissues were approximated with interrupted 4-0 chromic sutures. The skin was closed with 4-0 Monocryl subcuticular suture and Dermabond.  The patient appeared to tolerate the  procedure satisfactorily and was then prepared for transfer to the recovery room  [facility].D.

## 2016-08-21 NOTE — Anesthesia Procedure Notes (Signed)
Procedure Name: LMA Insertion Performed by: Tanaysha Alkins Pre-anesthesia Checklist: Patient identified, Patient being monitored, Timeout performed, Emergency Drugs available and Suction available Patient Re-evaluated:Patient Re-evaluated prior to inductionOxygen Delivery Method: Circle system utilized Preoxygenation: Pre-oxygenation with 100% oxygen Intubation Type: IV induction LMA: LMA inserted LMA Size: 3.0 Tube type: Oral Number of attempts: 1 Placement Confirmation: positive ETCO2 and breath sounds checked- equal and bilateral Tube secured with: Tape Dental Injury: Teeth and Oropharynx as per pre-operative assessment        

## 2016-08-22 NOTE — Anesthesia Postprocedure Evaluation (Signed)
Anesthesia Post Note  Patient: Veronica Boyd  Procedure(s) Performed: Procedure(s) (LRB): AXILLARY LYMPH NODE excision (Right)  Patient location during evaluation: PACU Anesthesia Type: General Level of consciousness: awake and alert and oriented Pain management: pain level controlled Vital Signs Assessment: post-procedure vital signs reviewed and stable Respiratory status: spontaneous breathing Cardiovascular status: blood pressure returned to baseline Anesthetic complications: no    Last Vitals:  Vitals:   08/21/16 0956 08/21/16 1032  BP: (!) 124/54 (!) 120/58  Pulse: 69 64  Resp: 18 17  Temp:      Last Pain:  Vitals:   08/22/16 0911  TempSrc:   PainSc: 2                  Shenelle Klas

## 2016-08-23 LAB — SURGICAL PATHOLOGY

## 2016-08-24 ENCOUNTER — Encounter: Payer: Self-pay | Admitting: Surgery

## 2016-08-27 NOTE — Progress Notes (Signed)
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 She continues to have increased swelling surrounding her right clavicle. She is anxious, but otherwise feels well.  She has no neurologic complaints. She denies any fevers, night sweats, or weight loss. She has no chest pain or shortness of breath. She denies any nausea, vomiting, constipation, or diarrhea. She has no urinary complaint. Patient offers no further specific complaints today.  REVIEW OF SYSTEMS:   Review of Systems  Constitutional: Negative.  Negative for fever, malaise/fatigue and weight loss.  Respiratory: Negative.  Negative for cough and shortness of breath.   Cardiovascular: Negative.  Negative for chest pain and leg swelling.  Gastrointestinal: Negative.  Negative for abdominal pain.  Genitourinary: Negative.   Musculoskeletal: Negative.   Neurological: Negative.   Psychiatric/Behavioral: Negative.  The patient is not nervous/anxious.    As per HPI. Otherwise, a complete review of systems is negative.   PAST MEDICAL HISTORY: Past Medical History:  Diagnosis Date  . Arthritis   . Cancer (HCC)    lymph nodes  . Cataract    bilateral  . Chicken pox   . Colon polyp   . Hyperlipidemia   . Osteoporosis     PAST SURGICAL HISTORY: Past Surgical History:  Procedure Laterality Date  . AXILLARY LYMPH NODE DISSECTION Right 08/21/2016   Procedure: AXILLARY LYMPH NODE excision;  Surgeon: Leonie Green, MD;  Location: [facility] ORS;  Service: General;  Laterality: Right;  . CATARACT EXTRACTION,  BILATERAL Bilateral   . JOINT REPLACEMENT Left   . TONSILLECTOMY    . TOTAL HIP ARTHROPLASTY Left 1992    FAMILY HISTORY: Family History  Problem Relation Age of Onset  . Diabetes Sister   . Lung cancer Brother   . Diabetes Brother   . Basal cell carcinoma Daughter   . Breast cancer Paternal Aunt     ADVANCED DIRECTIVES (Y/N):  N  HEALTH MAINTENANCE: Social History  Substance Use Topics  . Smoking status: Never Smoker  . Smokeless tobacco: Never Used  . Alcohol use No     Colonoscopy:  PAP:  Bone density:  Lipid panel:  No Known Allergies  Current Outpatient Prescriptions  Medication Sig Dispense Refill  . alendronate (FOSAMAX) 70 MG tablet Take 70 mg by mouth once a week. Take with a full glass of water on an empty stomach.    . Cholecalciferol (VITAMIN D-3) 1000 units CAPS Take 1,000 Units by mouth daily.    Marland Kitchen HYDROcodone-acetaminophen (NORCO) 5-325 MG tablet Take 1-2 tablets by mouth every 4 (four) hours as needed for moderate pain. 12 tablet 0  . meloxicam (MOBIC) 15 MG tablet Take 1 tablet by mouth daily.    . Multiple Vitamins-Minerals (CENTRUM SILVER PO) Take 1 tablet by mouth daily.    . ondansetron (ZOFRAN-ODT) 8 MG disintegrating tablet Take 1 tablet (8 mg total) by mouth every 8 (eight) hours as needed for nausea or vomiting. 30 tablet 2  . pantoprazole (PROTONIX) 20 MG tablet Take 1 tablet by mouth daily.    . simvastatin (ZOCOR) 20 MG tablet Take 1 tablet by mouth at bedtime.     Marland Kitchen  tiZANidine (ZANAFLEX) 2 MG tablet Take 1 tablet by mouth at bedtime as needed.     . traMADol-acetaminophen (ULTRACET) 37.5-325 MG tablet Take 1 tablet by mouth every 8 (eight) hours as needed.      No current facility-administered medications for this visit.     OBJECTIVE: Vitals:   08/28/16 1440  BP: 133/84  Pulse: 87  Resp: 18  Temp: 99.1 F (37.3 C)     Body mass index is 24.11 kg/m.    ECOG FS:0 - Asymptomatic  General: Well-developed, well-nourished, no acute  distress. Eyes: Pink conjunctiva, anicteric sclera. Lungs: Clear to auscultation bilaterally. Heart: Regular rate and rhythm. No rubs, murmurs, or gallops. Abdomen: Soft, nontender, nondistended. No organomegaly noted, normoactive bowel sounds. Musculoskeletal: No edema, cyanosis, or clubbing. Neuro: Alert, answering all questions appropriately. Cranial nerves grossly intact. Skin: No rashes or petechiae noted. Psych: Normal affect. Lymphatics: Easily palpable right supraclavicular lymphadenopathy, increased in size.   LAB RESULTS:  Lab Results  Component Value Date   NA 136 07/26/2016   K 4.0 07/26/2016   CL 103 07/26/2016   CO2 25 07/26/2016   GLUCOSE 95 07/26/2016   BUN 20 07/26/2016   CREATININE 0.81 07/26/2016   CALCIUM 9.4 07/26/2016   PROT 7.2 07/26/2016   ALBUMIN 4.6 07/26/2016   AST 22 07/26/2016   ALT 17 07/26/2016   ALKPHOS 61 07/26/2016   BILITOT 0.6 07/26/2016   GFRNONAA >60 07/26/2016   GFRAA >60 07/26/2016    Lab Results  Component Value Date   WBC 6.1 07/26/2016   NEUTROABS 3.6 07/26/2016   HGB 15.1 07/26/2016   HCT 43.8 07/26/2016   MCV 90.6 07/26/2016   PLT 210 07/26/2016     STUDIES: No results found.  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  PLAN:    1. At least stage II Follicular lymphoma:  PET scan results reviewed independently consistent with stage II lymphoma. Excisional biopsy confirmed follicular lymphoma. Although patient is only grade 1-2, her Ki-67 is 75% indicating a possibly more aggressive lymphoma. After lengthy discussion with the patient and her daughter, she does not wish to pursue aggressive chemotherapy at this time, but would reconsider it in the future. She has agreed to single agent Rituxan. She continues to decline bone marrow biopsy. Return to clinic on September 10, 2016 initiate cycle 1 of 4 of weekly Rituxan.    Approximately 30 minutes was spent in discussion of which greater than 50%  was consultation.  Patient expressed understanding and was in agreement with this plan. She also understands that She can call clinic at any time with any questions, concerns, or complaints.   Follicular lymphoma ([place])   Staging form: Lymphoid Neoplasms, AJCC 6th Edition   - Clinical stage from 08/27/2016: Stage IIE (lymphoma only) - Signed by Lloyd Huger, MD on 08/27/2016  Lloyd Huger, MD   09/02/2016 9:11 AM

## 2016-08-28 ENCOUNTER — Other Ambulatory Visit: Payer: Self-pay | Admitting: Pathology

## 2016-08-28 ENCOUNTER — Inpatient Hospital Stay (HOSPITAL_BASED_OUTPATIENT_CLINIC_OR_DEPARTMENT_OTHER): Payer: Medicare Other | Admitting: Oncology

## 2016-08-28 VITALS — BP 133/84 | HR 87 | Temp 99.1°F | Resp 18 | Wt 119.4 lb

## 2016-08-28 DIAGNOSIS — I700 Atherosclerosis of aorta: Secondary | ICD-10-CM

## 2016-08-28 DIAGNOSIS — M129 Arthropathy, unspecified: Secondary | ICD-10-CM | POA: Diagnosis not present

## 2016-08-28 DIAGNOSIS — Z79899 Other long term (current) drug therapy: Secondary | ICD-10-CM

## 2016-08-28 DIAGNOSIS — Z801 Family history of malignant neoplasm of trachea, bronchus and lung: Secondary | ICD-10-CM

## 2016-08-28 DIAGNOSIS — I2510 Atherosclerotic heart disease of native coronary artery without angina pectoris: Secondary | ICD-10-CM

## 2016-08-28 DIAGNOSIS — Z808 Family history of malignant neoplasm of other organs or systems: Secondary | ICD-10-CM

## 2016-08-28 DIAGNOSIS — C8290 Follicular lymphoma, unspecified, unspecified site: Secondary | ICD-10-CM | POA: Diagnosis not present

## 2016-08-28 DIAGNOSIS — E785 Hyperlipidemia, unspecified: Secondary | ICD-10-CM

## 2016-08-28 DIAGNOSIS — C8214 Follicular lymphoma grade II, lymph nodes of axilla and upper limb: Secondary | ICD-10-CM

## 2016-08-28 DIAGNOSIS — Z803 Family history of malignant neoplasm of breast: Secondary | ICD-10-CM

## 2016-08-28 NOTE — Progress Notes (Signed)
States has slight tenderness to excisional site but feeling well today.

## 2016-08-30 ENCOUNTER — Inpatient Hospital Stay: Payer: Medicare Other

## 2016-09-02 ENCOUNTER — Other Ambulatory Visit: Payer: Self-pay | Admitting: Oncology

## 2016-09-02 DIAGNOSIS — C8298 Follicular lymphoma, unspecified, lymph nodes of multiple sites: Secondary | ICD-10-CM

## 2016-09-09 NOTE — Progress Notes (Signed)
[place]  Telephone:(336) [phone] Fax:(336) [phone]  ID: [name] OB: 1946/06/18  MR#: [id number]  YBW#:[id number]  Patient Care Team: Glendon Axe, MD as PCP - General (Internal Medicine) Leonie Green, MD as Referring Physician (Surgery)  CHIEF COMPLAINT: At least stage II Follicular lymphoma  INTERVAL HISTORY: Patient returns to clinic today for further evaluation and initiation of cycle 1 of 4 of weekly Rituxan. She continues to have increased swelling surrounding her right clavicle and now has noted increased right axillary lymphadenopathy. She is anxious, but otherwise feels well.  She has no neurologic complaints. She denies any fevers, night sweats, or weight loss. She has no chest pain or shortness of breath. She denies any nausea, vomiting, constipation, or diarrhea. She has no urinary complaint. Patient offers no further specific complaints today.  REVIEW OF SYSTEMS:   Review of Systems  Constitutional: Negative.  Negative for fever, malaise/fatigue and weight loss.  Respiratory: Negative.  Negative for cough and shortness of breath.   Cardiovascular: Negative.  Negative for chest pain and leg swelling.  Gastrointestinal: Negative.  Negative for abdominal pain.  Genitourinary: Negative.   Musculoskeletal: Negative.   Neurological: Negative.   Psychiatric/Behavioral: Negative.  The patient is not nervous/anxious.    As per HPI. Otherwise, a complete review of systems is negative.   PAST MEDICAL HISTORY: Past Medical History:  Diagnosis Date  . Arthritis   . Cancer (HCC)    lymph nodes  . Cataract    bilateral  . Chicken pox   . Colon polyp   . Hyperlipidemia   . Osteoporosis     PAST SURGICAL HISTORY: Past Surgical History:  Procedure Laterality Date  . AXILLARY LYMPH NODE DISSECTION Right 08/21/2016   Procedure: AXILLARY LYMPH NODE excision;  Surgeon: Leonie Green, MD;  Location: [facility] ORS;  Service: General;   Laterality: Right;  . CATARACT EXTRACTION, BILATERAL Bilateral   . JOINT REPLACEMENT Left   . TONSILLECTOMY    . TOTAL HIP ARTHROPLASTY Left 1992    FAMILY HISTORY: Family History  Problem Relation Age of Onset  . Diabetes Sister   . Lung cancer Brother   . Diabetes Brother   . Basal cell carcinoma Daughter   . Breast cancer Paternal Aunt     ADVANCED DIRECTIVES (Y/N):  N  HEALTH MAINTENANCE: Social History  Substance Use Topics  . Smoking status: Never Smoker  . Smokeless tobacco: Never Used  . Alcohol use No     Colonoscopy:  PAP:  Bone density:  Lipid panel:  No Known Allergies  Current Outpatient Prescriptions  Medication Sig Dispense Refill  . alendronate (FOSAMAX) 70 MG tablet Take 70 mg by mouth once a week. Take with a full glass of water on an empty stomach.    . Cholecalciferol (VITAMIN D-3) 1000 units CAPS Take 1,000 Units by mouth daily.    Marland Kitchen HYDROcodone-acetaminophen (NORCO) 5-325 MG tablet Take 1-2 tablets by mouth every 4 (four) hours as needed for moderate pain. 12 tablet 0  . meloxicam (MOBIC) 15 MG tablet Take 1 tablet by mouth daily.    . Multiple Vitamins-Minerals (CENTRUM SILVER PO) Take 1 tablet by mouth daily.    . ondansetron (ZOFRAN-ODT) 8 MG disintegrating tablet Take 1 tablet (8 mg total) by mouth every 8 (eight) hours as needed for nausea or vomiting. 30 tablet 2  . pantoprazole (PROTONIX) 20 MG tablet Take 1 tablet by mouth daily.    . simvastatin (ZOCOR) 20 MG tablet Take 1  tablet by mouth at bedtime.     Marland Kitchen tiZANidine (ZANAFLEX) 2 MG tablet Take 1 tablet by mouth at bedtime as needed.     . traMADol-acetaminophen (ULTRACET) 37.5-325 MG tablet Take 1 tablet by mouth every 8 (eight) hours as needed.      No current facility-administered medications for this visit.     OBJECTIVE: Vitals:   09/10/16 0944  BP: 138/77  Pulse: 76  Temp: 99.2 F (37.3 C)     Body mass index is 24.44 kg/m.    ECOG FS:0 - Asymptomatic  General:  Well-developed, well-nourished, no acute distress. Eyes: Pink conjunctiva, anicteric sclera. Lungs: Clear to auscultation bilaterally. Heart: Regular rate and rhythm. No rubs, murmurs, or gallops. Abdomen: Soft, nontender, nondistended. No organomegaly noted, normoactive bowel sounds. Musculoskeletal: No edema, cyanosis, or clubbing. Neuro: Alert, answering all questions appropriately. Cranial nerves grossly intact. Skin: No rashes or petechiae noted. Psych: Normal affect. Lymphatics: Easily palpable right supraclavicular and right axillary lymphadenopathy.   LAB RESULTS:  Lab Results  Component Value Date   NA 138 09/10/2016   K 4.0 09/10/2016   CL 103 09/10/2016   CO2 28 09/10/2016   GLUCOSE 140 (H) 09/10/2016   BUN 20 09/10/2016   CREATININE 0.90 09/10/2016   CALCIUM 10.0 09/10/2016   PROT 7.0 09/10/2016   ALBUMIN 4.4 09/10/2016   AST 27 09/10/2016   ALT 16 09/10/2016   ALKPHOS 66 09/10/2016   BILITOT 0.3 09/10/2016   GFRNONAA >60 09/10/2016   GFRAA >60 09/10/2016    Lab Results  Component Value Date   WBC 7.2 09/10/2016   NEUTROABS 4.6 09/10/2016   HGB 14.4 09/10/2016   HCT 41.7 09/10/2016   MCV 91.7 09/10/2016   PLT 206 09/10/2016     STUDIES: No results found.  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  PLAN:    1. At least stage II Follicular lymphoma:  PET scan results reviewed independently consistent with stage II lymphoma. Excisional biopsy confirmed follicular lymphoma. Although patient is only grade 1-2, her Ki-67 is 75% indicating a possibly more aggressive lymphoma. After lengthy discussion with the patient and her daughter, she does not wish to pursue aggressive chemotherapy at this time, but would reconsider it in the future. She has agreed to single agent Rituxan. She continues to decline bone marrow biopsy. Proceed with cycle 1 of 4 of weekly Rituxan. Return to clinic in 1 week for consideration of cycle 2.  2.  Hyperglycemia: Monitor while on treatment.   Approximately 30 minutes was spent in discussion of which greater than 50% was consultation.  Patient expressed understanding and was in agreement with this plan. She also understands that She can call clinic at any time with any questions, concerns, or complaints.   Follicular lymphoma ([place])   Staging form: Lymphoid Neoplasms, AJCC 6th Edition   - Clinical stage from 08/27/2016: Stage IIE (lymphoma only) - Signed by Lloyd Huger, MD on 08/27/2016  Lloyd Huger, MD   09/10/2016 9:55 AM

## 2016-09-10 ENCOUNTER — Inpatient Hospital Stay: Payer: Medicare Other | Attending: Oncology

## 2016-09-10 ENCOUNTER — Inpatient Hospital Stay (HOSPITAL_BASED_OUTPATIENT_CLINIC_OR_DEPARTMENT_OTHER): Payer: Medicare Other | Admitting: Oncology

## 2016-09-10 ENCOUNTER — Encounter: Payer: Self-pay | Admitting: Oncology

## 2016-09-10 ENCOUNTER — Inpatient Hospital Stay: Payer: Medicare Other

## 2016-09-10 VITALS — BP 138/77 | HR 76 | Temp 99.2°F | Wt 121.0 lb

## 2016-09-10 VITALS — BP 114/70 | HR 74 | Resp 18

## 2016-09-10 DIAGNOSIS — M810 Age-related osteoporosis without current pathological fracture: Secondary | ICD-10-CM | POA: Insufficient documentation

## 2016-09-10 DIAGNOSIS — C8298 Follicular lymphoma, unspecified, lymph nodes of multiple sites: Secondary | ICD-10-CM

## 2016-09-10 DIAGNOSIS — Z803 Family history of malignant neoplasm of breast: Secondary | ICD-10-CM | POA: Insufficient documentation

## 2016-09-10 DIAGNOSIS — M129 Arthropathy, unspecified: Secondary | ICD-10-CM

## 2016-09-10 DIAGNOSIS — R221 Localized swelling, mass and lump, neck: Secondary | ICD-10-CM | POA: Diagnosis not present

## 2016-09-10 DIAGNOSIS — Z79899 Other long term (current) drug therapy: Secondary | ICD-10-CM

## 2016-09-10 DIAGNOSIS — E785 Hyperlipidemia, unspecified: Secondary | ICD-10-CM

## 2016-09-10 DIAGNOSIS — F419 Anxiety disorder, unspecified: Secondary | ICD-10-CM | POA: Insufficient documentation

## 2016-09-10 DIAGNOSIS — E7800 Pure hypercholesterolemia, unspecified: Secondary | ICD-10-CM | POA: Insufficient documentation

## 2016-09-10 DIAGNOSIS — R739 Hyperglycemia, unspecified: Secondary | ICD-10-CM

## 2016-09-10 DIAGNOSIS — Z5112 Encounter for antineoplastic immunotherapy: Secondary | ICD-10-CM | POA: Insufficient documentation

## 2016-09-10 DIAGNOSIS — Z86010 Personal history of colonic polyps: Secondary | ICD-10-CM | POA: Diagnosis not present

## 2016-09-10 DIAGNOSIS — M818 Other osteoporosis without current pathological fracture: Secondary | ICD-10-CM

## 2016-09-10 DIAGNOSIS — Z801 Family history of malignant neoplasm of trachea, bronchus and lung: Secondary | ICD-10-CM | POA: Insufficient documentation

## 2016-09-10 LAB — COMPREHENSIVE METABOLIC PANEL
ALBUMIN: 4.4 g/dL (ref 3.5–5.0)
ALK PHOS: 66 U/L (ref 38–126)
ALT: 16 U/L (ref 14–54)
ANION GAP: 7 (ref 5–15)
AST: 27 U/L (ref 15–41)
BUN: 20 mg/dL (ref 6–20)
CALCIUM: 10 mg/dL (ref 8.9–10.3)
CO2: 28 mmol/L (ref 22–32)
Chloride: 103 mmol/L (ref 101–111)
Creatinine, Ser: 0.9 mg/dL (ref 0.44–1.00)
GFR calc Af Amer: >60 mL/min (ref >60)
GFR calc non Af Amer: >60 mL/min (ref >60)
GLUCOSE: 140 mg/dL — AB (ref 65–99)
POTASSIUM: 4 mmol/L (ref 3.5–5.1)
SODIUM: 138 mmol/L (ref 135–145)
Total Bilirubin: 0.3 mg/dL (ref 0.3–1.2)
Total Protein: 7 g/dL (ref 6.5–8.1)

## 2016-09-10 LAB — CBC WITH DIFFERENTIAL/PLATELET
BASOS PCT: 1 %
Basophils Absolute: 0.1 10*3/uL (ref 0–0.1)
EOS ABS: 0.9 10*3/uL — AB (ref 0–0.7)
Eosinophils Relative: 12 %
HCT: 41.7 % (ref 35.0–47.0)
HEMOGLOBIN: 14.4 g/dL (ref 12.0–16.0)
Lymphocytes Relative: 15 %
Lymphs Abs: 1.1 10*3/uL (ref 1.0–3.6)
MCH: 31.7 pg (ref 26.0–34.0)
MCHC: 34.6 g/dL (ref 32.0–36.0)
MCV: 91.7 fL (ref 80.0–100.0)
MONOS PCT: 7 %
Monocytes Absolute: 0.5 10*3/uL (ref 0.2–0.9)
NEUTROS PCT: 65 %
Neutro Abs: 4.6 10*3/uL (ref 1.4–6.5)
Platelets: 206 10*3/uL (ref 150–440)
RBC: 4.55 MIL/uL (ref 3.80–5.20)
RDW: 13 % (ref 11.5–14.5)
WBC: 7.2 10*3/uL (ref 3.6–11.0)

## 2016-09-10 MED ORDER — DIPHENHYDRAMINE HCL 25 MG PO CAPS
25.0000 mg | ORAL_CAPSULE | Freq: Once | ORAL | Status: AC
Start: 2016-09-10 — End: 2016-09-10
  Administered 2016-09-10: 25 mg via ORAL
  Filled 2016-09-10: qty 1

## 2016-09-10 MED ORDER — SODIUM CHLORIDE 0.9 % IV SOLN
375.0000 mg/m2 | Freq: Once | INTRAVENOUS | Status: AC
  Administered 2016-09-10: 600 mg via INTRAVENOUS
  Filled 2016-09-10: qty 50

## 2016-09-10 MED ORDER — ACETAMINOPHEN 325 MG PO TABS
650.0000 mg | ORAL_TABLET | Freq: Once | ORAL | Status: AC
  Administered 2016-09-10: 650 mg via ORAL
  Filled 2016-09-10: qty 2

## 2016-09-10 MED ORDER — SODIUM CHLORIDE 0.9 % IV SOLN
INTRAVENOUS | Status: DC
  Administered 2016-09-10: 11:00:00 via INTRAVENOUS
  Filled 2016-09-10: qty 1000

## 2016-09-16 NOTE — Progress Notes (Signed)
[place]  Telephone:(336) [phone] Fax:(336) [phone]  ID: Veronica Boyd OB: 05-13-1946  MR#: [id number]  AOZ#:[id number]  Patient Care Team: Glendon Axe, MD as PCP - General (Internal Medicine) Leonie Green, MD as Referring Physician (Surgery)  CHIEF COMPLAINT: At least stage II Follicular lymphoma  INTERVAL HISTORY: Patient returns to clinic today for further evaluation and consideration of cycle 2 of 4 of weekly Rituxan. She continues to have increased swelling surrounding her right clavicle which is unchanged. She is anxious, but otherwise feels well.  She has no neurologic complaints. She denies any fevers, night sweats, or weight loss. She has no chest pain or shortness of breath. She denies any nausea, vomiting, constipation, or diarrhea. She has no urinary complaint. Patient offers no further specific complaints today.  REVIEW OF SYSTEMS:   Review of Systems  Constitutional: Negative.  Negative for fever, malaise/fatigue and weight loss.  Respiratory: Negative.  Negative for cough and shortness of breath.   Cardiovascular: Negative.  Negative for chest pain and leg swelling.  Gastrointestinal: Negative.  Negative for abdominal pain.  Genitourinary: Negative.   Musculoskeletal: Negative.   Neurological: Negative.   Psychiatric/Behavioral: Negative.  The patient is not nervous/anxious.    As per HPI. Otherwise, a complete review of systems is negative.   PAST MEDICAL HISTORY: Past Medical History:  Diagnosis Date  . Arthritis   . Cancer (HCC)    lymph nodes  . Cataract    bilateral  . Chicken pox   . Colon polyp   . Hyperlipidemia   . Osteoporosis     PAST SURGICAL HISTORY: Past Surgical History:  Procedure Laterality Date  . AXILLARY LYMPH NODE DISSECTION Right 08/21/2016   Procedure: AXILLARY LYMPH NODE excision;  Surgeon: Leonie Green, MD;  Location: [facility] ORS;  Service: General;  Laterality: Right;  . CATARACT EXTRACTION,  BILATERAL Bilateral   . JOINT REPLACEMENT Left   . TONSILLECTOMY    . TOTAL HIP ARTHROPLASTY Left 1992    FAMILY HISTORY: Family History  Problem Relation Age of Onset  . Diabetes Sister   . Lung cancer Brother   . Diabetes Brother   . Basal cell carcinoma Daughter   . Breast cancer Paternal Aunt     ADVANCED DIRECTIVES (Y/N):  N  HEALTH MAINTENANCE: Social History  Substance Use Topics  . Smoking status: Never Smoker  . Smokeless tobacco: Never Used  . Alcohol use No     Colonoscopy:  PAP:  Bone density:  Lipid panel:  No Known Allergies  Current Outpatient Prescriptions  Medication Sig Dispense Refill  . alendronate (FOSAMAX) 70 MG tablet Take 70 mg by mouth once a week. Take with a full glass of water on an empty stomach.    . Cholecalciferol (VITAMIN D-3) 1000 units CAPS Take 1,000 Units by mouth daily.    . meloxicam (MOBIC) 15 MG tablet Take 1 tablet by mouth daily.    . Multiple Vitamins-Minerals (CENTRUM SILVER PO) Take 1 tablet by mouth daily.    . ondansetron (ZOFRAN-ODT) 8 MG disintegrating tablet Take 1 tablet (8 mg total) by mouth every 8 (eight) hours as needed for nausea or vomiting. 30 tablet 2  . pantoprazole (PROTONIX) 20 MG tablet Take 1 tablet by mouth daily.    . simvastatin (ZOCOR) 20 MG tablet Take 1 tablet by mouth at bedtime.     Marland Kitchen tiZANidine (ZANAFLEX) 2 MG tablet Take 1 tablet by mouth at bedtime as needed.     Marland Kitchen  traMADol-acetaminophen (ULTRACET) 37.5-325 MG tablet Take 1 tablet by mouth every 8 (eight) hours as needed.      No current facility-administered medications for this visit.     OBJECTIVE: Vitals:   09/17/16 0854  BP: 133/87  Pulse: 91  Resp: 18  Temp: 97.6 F (36.4 C)     Body mass index is 23.87 kg/m.    ECOG FS:0 - Asymptomatic  General: Well-developed, well-nourished, no acute distress. Eyes: Pink conjunctiva, anicteric sclera. Lungs: Clear to auscultation bilaterally. Heart: Regular rate and rhythm. No rubs,  murmurs, or gallops. Abdomen: Soft, nontender, nondistended. No organomegaly noted, normoactive bowel sounds. Musculoskeletal: No edema, cyanosis, or clubbing. Neuro: Alert, answering all questions appropriately. Cranial nerves grossly intact. Skin: No rashes or petechiae noted. Psych: Normal affect. Lymphatics: Easily palpable right supraclavicular and right axillary lymphadenopathy.   LAB RESULTS:  Lab Results  Component Value Date   NA 139 09/17/2016   K 3.9 09/17/2016   CL 103 09/17/2016   CO2 27 09/17/2016   GLUCOSE 101 (H) 09/17/2016   BUN 17 09/17/2016   CREATININE 0.94 09/17/2016   CALCIUM 9.7 09/17/2016   PROT 7.4 09/17/2016   ALBUMIN 4.6 09/17/2016   AST 41 09/17/2016   ALT 32 09/17/2016   ALKPHOS 61 09/17/2016   BILITOT 0.7 09/17/2016   GFRNONAA 59 (L) 09/17/2016   GFRAA >60 09/17/2016    Lab Results  Component Value Date   WBC 4.4 09/17/2016   NEUTROABS 2.5 09/17/2016   HGB 15.2 09/17/2016   HCT 43.5 09/17/2016   MCV 90.3 09/17/2016   PLT 214 09/17/2016     STUDIES: No results found.  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  PLAN:    1. At least stage II Follicular lymphoma:  PET scan results reviewed independently consistent with stage II lymphoma. Excisional biopsy confirmed follicular lymphoma. Although patient is only grade 1-2, her Ki-67 is 75% indicating a possibly more aggressive lymphoma. After lengthy discussion with the patient and her daughter, she does not wish to pursue aggressive chemotherapy at this time, but would reconsider it in the future. She has agreed to single agent Rituxan. She continues to decline bone marrow biopsy. Proceed with cycle 2 of 4 of weekly Rituxan. Return to clinic in 1 week for consideration of cycle 3.  2. Hyperglycemia: Monitor while on treatment.   Approximately 30 minutes was spent in discussion of which greater than 50% was consultation.  Patient expressed understanding and  was in agreement with this plan. She also understands that She can call clinic at any time with any questions, concerns, or complaints.   Follicular lymphoma ([place])   Staging form: Lymphoid Neoplasms, AJCC 6th Edition   - Clinical stage from 08/27/2016: Stage IIE (lymphoma only) - Signed by Lloyd Huger, MD on 08/27/2016  Lloyd Huger, MD   09/17/2016 9:42 AM

## 2016-09-17 ENCOUNTER — Inpatient Hospital Stay: Payer: Medicare Other

## 2016-09-17 ENCOUNTER — Inpatient Hospital Stay (HOSPITAL_BASED_OUTPATIENT_CLINIC_OR_DEPARTMENT_OTHER): Payer: Medicare Other | Admitting: Oncology

## 2016-09-17 VITALS — BP 133/87 | HR 91 | Temp 97.6°F | Resp 18 | Wt 118.2 lb

## 2016-09-17 DIAGNOSIS — Z79899 Other long term (current) drug therapy: Secondary | ICD-10-CM

## 2016-09-17 DIAGNOSIS — C8298 Follicular lymphoma, unspecified, lymph nodes of multiple sites: Secondary | ICD-10-CM | POA: Diagnosis not present

## 2016-09-17 DIAGNOSIS — M129 Arthropathy, unspecified: Secondary | ICD-10-CM

## 2016-09-17 DIAGNOSIS — Z803 Family history of malignant neoplasm of breast: Secondary | ICD-10-CM

## 2016-09-17 DIAGNOSIS — F419 Anxiety disorder, unspecified: Secondary | ICD-10-CM

## 2016-09-17 DIAGNOSIS — R739 Hyperglycemia, unspecified: Secondary | ICD-10-CM | POA: Diagnosis not present

## 2016-09-17 DIAGNOSIS — E785 Hyperlipidemia, unspecified: Secondary | ICD-10-CM

## 2016-09-17 DIAGNOSIS — Z801 Family history of malignant neoplasm of trachea, bronchus and lung: Secondary | ICD-10-CM

## 2016-09-17 DIAGNOSIS — Z86010 Personal history of colonic polyps: Secondary | ICD-10-CM

## 2016-09-17 DIAGNOSIS — M818 Other osteoporosis without current pathological fracture: Secondary | ICD-10-CM

## 2016-09-17 LAB — CBC WITH DIFFERENTIAL/PLATELET
BASOS PCT: 1 %
Basophils Absolute: 0.1 10*3/uL (ref 0–0.1)
Eosinophils Absolute: 0.5 10*3/uL (ref 0–0.7)
Eosinophils Relative: 11 %
HEMATOCRIT: 43.5 % (ref 35.0–47.0)
Hemoglobin: 15.2 g/dL (ref 12.0–16.0)
LYMPHS ABS: 0.8 10*3/uL — AB (ref 1.0–3.6)
LYMPHS PCT: 18 %
MCH: 31.6 pg (ref 26.0–34.0)
MCHC: 35 g/dL (ref 32.0–36.0)
MCV: 90.3 fL (ref 80.0–100.0)
MONO ABS: 0.6 10*3/uL (ref 0.2–0.9)
MONOS PCT: 14 %
NEUTROS ABS: 2.5 10*3/uL (ref 1.4–6.5)
Neutrophils Relative %: 56 %
Platelets: 214 10*3/uL (ref 150–440)
RBC: 4.82 MIL/uL (ref 3.80–5.20)
RDW: 13.1 % (ref 11.5–14.5)
WBC: 4.4 10*3/uL (ref 3.6–11.0)

## 2016-09-17 LAB — COMPREHENSIVE METABOLIC PANEL
ALT: 32 U/L (ref 14–54)
ANION GAP: 9 (ref 5–15)
AST: 41 U/L (ref 15–41)
Albumin: 4.6 g/dL (ref 3.5–5.0)
Alkaline Phosphatase: 61 U/L (ref 38–126)
BILIRUBIN TOTAL: 0.7 mg/dL (ref 0.3–1.2)
BUN: 17 mg/dL (ref 6–20)
CALCIUM: 9.7 mg/dL (ref 8.9–10.3)
CO2: 27 mmol/L (ref 22–32)
Chloride: 103 mmol/L (ref 101–111)
Creatinine, Ser: 0.94 mg/dL (ref 0.44–1.00)
GFR, EST NON AFRICAN AMERICAN: 59 mL/min — AB (ref >60)
Glucose, Bld: 101 mg/dL — ABNORMAL HIGH (ref 65–99)
POTASSIUM: 3.9 mmol/L (ref 3.5–5.1)
Sodium: 139 mmol/L (ref 135–145)
TOTAL PROTEIN: 7.4 g/dL (ref 6.5–8.1)

## 2016-09-17 MED ORDER — SODIUM CHLORIDE 0.9 % IV SOLN
Freq: Once | INTRAVENOUS | Status: DC
  Filled 2016-09-17: qty 1000

## 2016-09-17 MED ORDER — ACETAMINOPHEN 325 MG PO TABS
650.0000 mg | ORAL_TABLET | Freq: Once | ORAL | Status: AC
  Administered 2016-09-17: 650 mg via ORAL
  Filled 2016-09-17: qty 2

## 2016-09-17 MED ORDER — SODIUM CHLORIDE 0.9 % IV SOLN
375.0000 mg/m2 | Freq: Once | INTRAVENOUS | Status: AC
  Administered 2016-09-17: 600 mg via INTRAVENOUS
  Filled 2016-09-17: qty 50

## 2016-09-17 MED ORDER — DIPHENHYDRAMINE HCL 25 MG PO CAPS
25.0000 mg | ORAL_CAPSULE | Freq: Once | ORAL | Status: AC
  Administered 2016-09-17: 25 mg via ORAL
  Filled 2016-09-17: qty 1

## 2016-09-17 MED ORDER — SODIUM CHLORIDE 0.9 % IV SOLN: 375.0000 mg/m2 | Freq: Once | INTRAVENOUS | Status: DC

## 2016-09-17 NOTE — Progress Notes (Signed)
States is feeling well. Offers no complaints. 

## 2016-09-23 NOTE — Progress Notes (Signed)
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 She continues to have increased swelling surrounding her right clavicle, but it is mildly improved this week. She otherwise feels well and is asymptomatic. She has no neurologic complaints. She denies any fevers, night sweats, or weight loss. She has no chest pain or shortness of breath. She denies any nausea, vomiting, constipation, or diarrhea. She has no urinary complaint. Patient offers no further specific complaints today.  REVIEW OF SYSTEMS:   Review of Systems  Constitutional: Negative.  Negative for fever, malaise/fatigue and weight loss.  Respiratory: Negative.  Negative for cough and shortness of breath.   Cardiovascular: Negative.  Negative for chest pain and leg swelling.  Gastrointestinal: Negative.  Negative for abdominal pain.  Genitourinary: Negative.   Musculoskeletal: Negative.   Neurological: Negative.   Psychiatric/Behavioral: Negative.  The patient is not nervous/anxious.    As per HPI. Otherwise, a complete review of systems is negative.   PAST MEDICAL HISTORY: Past Medical History:  Diagnosis Date  . Arthritis   . Cancer (HCC)    lymph nodes  . Cataract    bilateral  . Chicken pox   . Colon polyp   . Hyperlipidemia   . Osteoporosis     PAST SURGICAL HISTORY: Past Surgical History:  Procedure Laterality Date  . AXILLARY LYMPH NODE DISSECTION Right 08/21/2016   Procedure: AXILLARY LYMPH NODE excision;  Surgeon: Leonie Green, MD;  Location: [facility] ORS;  Service: General;  Laterality: Right;  .  CATARACT EXTRACTION, BILATERAL Bilateral   . JOINT REPLACEMENT Left   . TONSILLECTOMY    . TOTAL HIP ARTHROPLASTY Left 1992    FAMILY HISTORY: Family History  Problem Relation Age of Onset  . Diabetes Sister   . Lung cancer Brother   . Diabetes Brother   . Basal cell carcinoma Daughter   . Breast cancer Paternal Aunt     ADVANCED DIRECTIVES (Y/N):  N  HEALTH MAINTENANCE: Social History  Substance Use Topics  . Smoking status: Never Smoker  . Smokeless tobacco: Never Used  . Alcohol use No     Colonoscopy:  PAP:  Bone density:  Lipid panel:  No Known Allergies  Current Outpatient Prescriptions  Medication Sig Dispense Refill  . alendronate (FOSAMAX) 70 MG tablet Take 70 mg by mouth once a week. Take with a full glass of water on an empty stomach.    . Cholecalciferol (VITAMIN D-3) 1000 units CAPS Take 1,000 Units by mouth daily.    . meloxicam (MOBIC) 15 MG tablet Take 1 tablet by mouth daily.    . Multiple Vitamins-Minerals (CENTRUM SILVER PO) Take 1 tablet by mouth daily.    . ondansetron (ZOFRAN-ODT) 8 MG disintegrating tablet Take 1 tablet (8 mg total) by mouth every 8 (eight) hours as needed for nausea or vomiting. 30 tablet 2  . pantoprazole (PROTONIX) 20 MG tablet Take 1 tablet by mouth daily.    . simvastatin (ZOCOR) 20 MG tablet Take 1 tablet by mouth at bedtime.     Marland Kitchen tiZANidine (ZANAFLEX) 2 MG tablet Take 1 tablet by mouth at bedtime as needed.     Marland Kitchen  traMADol-acetaminophen (ULTRACET) 37.5-325 MG tablet Take 1 tablet by mouth every 8 (eight) hours as needed.      No current facility-administered medications for this visit.     OBJECTIVE: Vitals:   09/24/16 0955  BP: 136/84  Pulse: 91  Resp: 18  Temp: 98.2 F (36.8 C)     Body mass index is 23.78 kg/m.    ECOG FS:0 - Asymptomatic  General: Well-developed, well-nourished, no acute distress. Eyes: Pink conjunctiva, anicteric sclera. Lungs: Clear to auscultation bilaterally. Heart: Regular rate and  rhythm. No rubs, murmurs, or gallops. Abdomen: Soft, nontender, nondistended. No organomegaly noted, normoactive bowel sounds. Musculoskeletal: No edema, cyanosis, or clubbing. Neuro: Alert, answering all questions appropriately. Cranial nerves grossly intact. Skin: No rashes or petechiae noted. Psych: Normal affect. Lymphatics: Easily palpable right supraclavicular and right axillary lymphadenopathy, slightly decreased in size.   LAB RESULTS:  Lab Results  Component Value Date   NA 137 09/24/2016   K 3.7 09/24/2016   CL 105 09/24/2016   CO2 27 09/24/2016   GLUCOSE 138 (H) 09/24/2016   BUN 20 09/24/2016   CREATININE 0.80 09/24/2016   CALCIUM 9.4 09/24/2016   PROT 7.2 09/24/2016   ALBUMIN 4.6 09/24/2016   AST 25 09/24/2016   ALT 20 09/24/2016   ALKPHOS 66 09/24/2016   BILITOT 0.7 09/24/2016   GFRNONAA >60 09/24/2016   GFRAA >60 09/24/2016    Lab Results  Component Value Date   WBC 6.5 09/24/2016   NEUTROABS 3.4 09/24/2016   HGB 14.5 09/24/2016   HCT 42.6 09/24/2016   MCV 90.9 09/24/2016   PLT 225 09/24/2016     STUDIES: No results found.  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  PLAN:    1. At least stage II Follicular lymphoma:  PET scan results reviewed independently consistent with stage II lymphoma. Excisional biopsy confirmed follicular lymphoma. Although patient is only grade 1-2, her Ki-67 is 75% indicating a possibly more aggressive lymphoma. After lengthy discussion with the patient and her daughter, she does not wish to pursue aggressive chemotherapy at this time, but would reconsider it in the future. She has agreed to single agent Rituxan. She continues to decline bone marrow biopsy. Proceed with cycle 3 of 4 of weekly Rituxan. Return to clinic in 1 week for consideration of cycle 4.  2. Hyperglycemia: Monitor while on treatment.   Approximately 30 minutes was spent in discussion of which greater than 50% was  consultation.  Patient expressed understanding and was in agreement with this plan. She also understands that She can call clinic at any time with any questions, concerns, or complaints.   Follicular lymphoma ([place])   Staging form: Lymphoid Neoplasms, AJCC 6th Edition   - Clinical stage from 08/27/2016: Stage IIE (lymphoma only) - Signed by Lloyd Huger, MD on 08/27/2016  Lloyd Huger, MD   09/24/2016 3:48 PM

## 2016-09-24 ENCOUNTER — Inpatient Hospital Stay (HOSPITAL_BASED_OUTPATIENT_CLINIC_OR_DEPARTMENT_OTHER): Payer: Medicare Other | Admitting: Oncology

## 2016-09-24 ENCOUNTER — Inpatient Hospital Stay: Payer: Medicare Other

## 2016-09-24 VITALS — BP 136/84 | HR 91 | Temp 98.2°F | Resp 18 | Wt 117.7 lb

## 2016-09-24 DIAGNOSIS — Z803 Family history of malignant neoplasm of breast: Secondary | ICD-10-CM

## 2016-09-24 DIAGNOSIS — F419 Anxiety disorder, unspecified: Secondary | ICD-10-CM

## 2016-09-24 DIAGNOSIS — C8298 Follicular lymphoma, unspecified, lymph nodes of multiple sites: Secondary | ICD-10-CM

## 2016-09-24 DIAGNOSIS — R739 Hyperglycemia, unspecified: Secondary | ICD-10-CM

## 2016-09-24 DIAGNOSIS — Z801 Family history of malignant neoplasm of trachea, bronchus and lung: Secondary | ICD-10-CM

## 2016-09-24 DIAGNOSIS — M818 Other osteoporosis without current pathological fracture: Secondary | ICD-10-CM

## 2016-09-24 DIAGNOSIS — M129 Arthropathy, unspecified: Secondary | ICD-10-CM | POA: Diagnosis not present

## 2016-09-24 DIAGNOSIS — Z79899 Other long term (current) drug therapy: Secondary | ICD-10-CM

## 2016-09-24 DIAGNOSIS — Z86010 Personal history of colonic polyps: Secondary | ICD-10-CM

## 2016-09-24 DIAGNOSIS — E785 Hyperlipidemia, unspecified: Secondary | ICD-10-CM

## 2016-09-24 LAB — CBC WITH DIFFERENTIAL/PLATELET
BASOS ABS: 0 10*3/uL (ref 0–0.1)
Basophils Relative: 1 %
EOS PCT: 4 %
Eosinophils Absolute: 0.3 10*3/uL (ref 0–0.7)
HCT: 42.6 % (ref 35.0–47.0)
HEMOGLOBIN: 14.5 g/dL (ref 12.0–16.0)
LYMPHS ABS: 2.2 10*3/uL (ref 1.0–3.6)
LYMPHS PCT: 33 %
MCH: 31 pg (ref 26.0–34.0)
MCHC: 34.1 g/dL (ref 32.0–36.0)
MCV: 90.9 fL (ref 80.0–100.0)
Monocytes Absolute: 0.6 10*3/uL (ref 0.2–0.9)
Monocytes Relative: 10 %
NEUTROS PCT: 52 %
Neutro Abs: 3.4 10*3/uL (ref 1.4–6.5)
PLATELETS: 225 10*3/uL (ref 150–440)
RBC: 4.69 MIL/uL (ref 3.80–5.20)
RDW: 13.1 % (ref 11.5–14.5)
WBC: 6.5 10*3/uL (ref 3.6–11.0)

## 2016-09-24 LAB — COMPREHENSIVE METABOLIC PANEL
ALK PHOS: 66 U/L (ref 38–126)
ALT: 20 U/L (ref 14–54)
AST: 25 U/L (ref 15–41)
Albumin: 4.6 g/dL (ref 3.5–5.0)
Anion gap: 5 (ref 5–15)
BUN: 20 mg/dL (ref 6–20)
CALCIUM: 9.4 mg/dL (ref 8.9–10.3)
CHLORIDE: 105 mmol/L (ref 101–111)
CO2: 27 mmol/L (ref 22–32)
CREATININE: 0.8 mg/dL (ref 0.44–1.00)
GFR calc Af Amer: >60 mL/min (ref >60)
Glucose, Bld: 138 mg/dL — ABNORMAL HIGH (ref 65–99)
Potassium: 3.7 mmol/L (ref 3.5–5.1)
SODIUM: 137 mmol/L (ref 135–145)
Total Bilirubin: 0.7 mg/dL (ref 0.3–1.2)
Total Protein: 7.2 g/dL (ref 6.5–8.1)

## 2016-09-24 MED ORDER — SODIUM CHLORIDE 0.9 % IV SOLN: 375.0000 mg/m2 | Freq: Once | INTRAVENOUS | Status: DC

## 2016-09-24 MED ORDER — ACETAMINOPHEN 325 MG PO TABS
650.0000 mg | ORAL_TABLET | Freq: Once | ORAL | Status: AC
  Administered 2016-09-24: 650 mg via ORAL
  Filled 2016-09-24: qty 2

## 2016-09-24 MED ORDER — SODIUM CHLORIDE 0.9 % IV SOLN
Freq: Once | INTRAVENOUS | Status: AC
  Administered 2016-09-24: 11:00:00 via INTRAVENOUS
  Filled 2016-09-24: qty 1000

## 2016-09-24 MED ORDER — RITUXIMAB CHEMO INJECTION 500 MG/50ML
375.0000 mg/m2 | Freq: Once | INTRAVENOUS | Status: AC
  Administered 2016-09-24: 600 mg via INTRAVENOUS
  Filled 2016-09-24: qty 50

## 2016-09-24 MED ORDER — DIPHENHYDRAMINE HCL 25 MG PO CAPS: 25.0000 mg | ORAL_CAPSULE | Freq: Once | ORAL | Status: DC

## 2016-09-24 NOTE — Progress Notes (Signed)
Offers no complaints  

## 2016-10-01 NOTE — Progress Notes (Signed)
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 She continues to have increased swelling surrounding her right clavicle, but it is mildly improved this week. She otherwise feels well and is asymptomatic. She has no neurologic complaints. She denies any fevers, night sweats, or weight loss. She has no chest pain or shortness of breath. She denies any nausea, vomiting, constipation, or diarrhea. She has no urinary complaint. Patient offers no further specific complaints today.  REVIEW OF SYSTEMS:   Review of Systems  Constitutional: Negative.  Negative for fever, malaise/fatigue and weight loss.  Respiratory: Negative.  Negative for cough and shortness of breath.   Cardiovascular: Negative.  Negative for chest pain and leg swelling.  Gastrointestinal: Negative.  Negative for abdominal pain.  Genitourinary: Negative.   Musculoskeletal: Negative.   Neurological: Negative.   Psychiatric/Behavioral: Negative.  The patient is not nervous/anxious.    As per HPI. Otherwise, a complete review of systems is negative.   PAST MEDICAL HISTORY: Past Medical History:  Diagnosis Date  . Arthritis   . Cancer (HCC)    lymph nodes  . Cataract    bilateral  . Chicken pox   . Colon polyp   . Hyperlipidemia   . Osteoporosis     PAST SURGICAL HISTORY: Past Surgical History:  Procedure Laterality Date  . AXILLARY LYMPH NODE DISSECTION Right 08/21/2016   Procedure: AXILLARY LYMPH NODE excision;  Surgeon: Leonie Green, MD;  Location: [facility] ORS;  Service: General;  Laterality: Right;  .  CATARACT EXTRACTION, BILATERAL Bilateral   . JOINT REPLACEMENT Left   . TONSILLECTOMY    . TOTAL HIP ARTHROPLASTY Left 1992    FAMILY HISTORY: Family History  Problem Relation Age of Onset  . Diabetes Sister   . Lung cancer Brother   . Diabetes Brother   . Basal cell carcinoma Daughter   . Breast cancer Paternal Aunt     ADVANCED DIRECTIVES (Y/N):  N  HEALTH MAINTENANCE: Social History  Substance Use Topics  . Smoking status: Never Smoker  . Smokeless tobacco: Never Used  . Alcohol use No     Colonoscopy:  PAP:  Bone density:  Lipid panel:  No Known Allergies  Current Outpatient Prescriptions  Medication Sig Dispense Refill  . alendronate (FOSAMAX) 70 MG tablet Take 70 mg by mouth once a week. Take with a full glass of water on an empty stomach.    . Cholecalciferol (VITAMIN D-3) 1000 units CAPS Take 1,000 Units by mouth daily.    . meloxicam (MOBIC) 15 MG tablet Take 1 tablet by mouth daily.    . Multiple Vitamins-Minerals (CENTRUM SILVER PO) Take 1 tablet by mouth daily.    . ondansetron (ZOFRAN-ODT) 8 MG disintegrating tablet Take 1 tablet (8 mg total) by mouth every 8 (eight) hours as needed for nausea or vomiting. 30 tablet 2  . pantoprazole (PROTONIX) 20 MG tablet Take 1 tablet by mouth daily.    . simvastatin (ZOCOR) 20 MG tablet Take 1 tablet by mouth at bedtime.     Marland Kitchen tiZANidine (ZANAFLEX) 2 MG tablet Take 1 tablet by mouth at bedtime as needed.     Marland Kitchen  traMADol-acetaminophen (ULTRACET) 37.5-325 MG tablet Take 1 tablet by mouth every 8 (eight) hours as needed.      No current facility-administered medications for this visit.     OBJECTIVE: Vitals:   10/02/16 0930  BP: 125/84  Pulse: 90  Resp: 18  Temp: 98 F (36.7 C)     Body mass index is 23.82 kg/m.    ECOG FS:0 - Asymptomatic  General: Well-developed, well-nourished, no acute distress. Eyes: Pink conjunctiva, anicteric sclera. Lungs: Clear to auscultation bilaterally. Heart: Regular rate and  rhythm. No rubs, murmurs, or gallops. Abdomen: Soft, nontender, nondistended. No organomegaly noted, normoactive bowel sounds. Musculoskeletal: No edema, cyanosis, or clubbing. Neuro: Alert, answering all questions appropriately. Cranial nerves grossly intact. Skin: No rashes or petechiae noted. Psych: Normal affect. Lymphatics: Easily palpable right supraclavicular and right axillary lymphadenopathy, slightly decreased in size.   LAB RESULTS:  Lab Results  Component Value Date   NA 140 10/02/2016   K 4.2 10/02/2016   CL 105 10/02/2016   CO2 28 10/02/2016   GLUCOSE 103 (H) 10/02/2016   BUN 16 10/02/2016   CREATININE 0.88 10/02/2016   CALCIUM 9.7 10/02/2016   PROT 7.3 10/02/2016   ALBUMIN 4.6 10/02/2016   AST 27 10/02/2016   ALT 20 10/02/2016   ALKPHOS 64 10/02/2016   BILITOT 0.8 10/02/2016   GFRNONAA >60 10/02/2016   GFRAA >60 10/02/2016    Lab Results  Component Value Date   WBC 7.4 10/02/2016   NEUTROABS 4.3 10/02/2016   HGB 14.8 10/02/2016   HCT 43.2 10/02/2016   MCV 91.4 10/02/2016   PLT 242 10/02/2016     STUDIES: No results found.  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  PLAN:    1. At least stage II Follicular lymphoma:  PET scan results reviewed independently consistent with stage II lymphoma. Excisional biopsy confirmed follicular lymphoma. Although patient is only grade 1-2, her Ki-67 is 75% indicating a possibly more aggressive lymphoma. After lengthy discussion with the patient and her daughter, she does not wish to pursue aggressive chemotherapy at this time, but would reconsider it in the future. She has agreed to single agent Rituxan. She continues to decline bone marrow biopsy. Proceed with cycle 3 of 4 of weekly Rituxan. Return to clinic in 1 week for consideration of cycle 4.  2. Hyperglycemia: Monitor while on treatment.   Approximately 30 minutes was spent in discussion of which greater than 50% was  consultation.  Patient expressed understanding and was in agreement with this plan. She also understands that She can call clinic at any time with any questions, concerns, or complaints.   Follicular lymphoma ([place])   Staging form: Lymphoid Neoplasms, AJCC 6th Edition   - Clinical stage from 08/27/2016: Stage IIE (lymphoma only) - Signed by Lloyd Huger, MD on 08/27/2016  Lloyd Huger, MD   10/02/2016 9:45 AM

## 2016-10-02 ENCOUNTER — Inpatient Hospital Stay: Payer: Medicare Other

## 2016-10-02 ENCOUNTER — Inpatient Hospital Stay (HOSPITAL_BASED_OUTPATIENT_CLINIC_OR_DEPARTMENT_OTHER): Payer: Medicare Other | Admitting: Oncology

## 2016-10-02 VITALS — BP 125/84 | HR 90 | Temp 98.0°F | Resp 18 | Wt 117.9 lb

## 2016-10-02 DIAGNOSIS — R739 Hyperglycemia, unspecified: Secondary | ICD-10-CM

## 2016-10-02 DIAGNOSIS — R221 Localized swelling, mass and lump, neck: Secondary | ICD-10-CM | POA: Diagnosis not present

## 2016-10-02 DIAGNOSIS — Z79899 Other long term (current) drug therapy: Secondary | ICD-10-CM

## 2016-10-02 DIAGNOSIS — C8298 Follicular lymphoma, unspecified, lymph nodes of multiple sites: Secondary | ICD-10-CM

## 2016-10-02 DIAGNOSIS — M818 Other osteoporosis without current pathological fracture: Secondary | ICD-10-CM

## 2016-10-02 DIAGNOSIS — Z86010 Personal history of colonic polyps: Secondary | ICD-10-CM

## 2016-10-02 DIAGNOSIS — E785 Hyperlipidemia, unspecified: Secondary | ICD-10-CM

## 2016-10-02 DIAGNOSIS — M129 Arthropathy, unspecified: Secondary | ICD-10-CM

## 2016-10-02 DIAGNOSIS — F419 Anxiety disorder, unspecified: Secondary | ICD-10-CM | POA: Diagnosis not present

## 2016-10-02 DIAGNOSIS — Z803 Family history of malignant neoplasm of breast: Secondary | ICD-10-CM

## 2016-10-02 DIAGNOSIS — Z801 Family history of malignant neoplasm of trachea, bronchus and lung: Secondary | ICD-10-CM

## 2016-10-02 LAB — COMPREHENSIVE METABOLIC PANEL
ALBUMIN: 4.6 g/dL (ref 3.5–5.0)
ALT: 20 U/L (ref 14–54)
ANION GAP: 7 (ref 5–15)
AST: 27 U/L (ref 15–41)
Alkaline Phosphatase: 64 U/L (ref 38–126)
BUN: 16 mg/dL (ref 6–20)
CHLORIDE: 105 mmol/L (ref 101–111)
CO2: 28 mmol/L (ref 22–32)
Calcium: 9.7 mg/dL (ref 8.9–10.3)
Creatinine, Ser: 0.88 mg/dL (ref 0.44–1.00)
GFR calc Af Amer: >60 mL/min (ref >60)
GFR calc non Af Amer: >60 mL/min (ref >60)
GLUCOSE: 103 mg/dL — AB (ref 65–99)
POTASSIUM: 4.2 mmol/L (ref 3.5–5.1)
SODIUM: 140 mmol/L (ref 135–145)
TOTAL PROTEIN: 7.3 g/dL (ref 6.5–8.1)
Total Bilirubin: 0.8 mg/dL (ref 0.3–1.2)

## 2016-10-02 LAB — CBC WITH DIFFERENTIAL/PLATELET
Basophils Absolute: 0 10*3/uL (ref 0–0.1)
Basophils Relative: 1 %
EOS PCT: 4 %
Eosinophils Absolute: 0.3 10*3/uL (ref 0–0.7)
HCT: 43.2 % (ref 35.0–47.0)
Hemoglobin: 14.8 g/dL (ref 12.0–16.0)
LYMPHS ABS: 1.8 10*3/uL (ref 1.0–3.6)
LYMPHS PCT: 24 %
MCH: 31.4 pg (ref 26.0–34.0)
MCHC: 34.4 g/dL (ref 32.0–36.0)
MCV: 91.4 fL (ref 80.0–100.0)
MONO ABS: 0.9 10*3/uL (ref 0.2–0.9)
MONOS PCT: 13 %
Neutro Abs: 4.3 10*3/uL (ref 1.4–6.5)
Neutrophils Relative %: 58 %
PLATELETS: 242 10*3/uL (ref 150–440)
RBC: 4.73 MIL/uL (ref 3.80–5.20)
RDW: 12.9 % (ref 11.5–14.5)
WBC: 7.4 10*3/uL (ref 3.6–11.0)

## 2016-10-02 MED ORDER — SODIUM CHLORIDE 0.9 % IV SOLN
Freq: Once | INTRAVENOUS | Status: AC
  Administered 2016-10-02: 11:00:00 via INTRAVENOUS
  Filled 2016-10-02: qty 1000

## 2016-10-02 MED ORDER — ACETAMINOPHEN 325 MG PO TABS
650.0000 mg | ORAL_TABLET | Freq: Once | ORAL | Status: AC
  Administered 2016-10-02: 650 mg via ORAL
  Filled 2016-10-02: qty 2

## 2016-10-02 MED ORDER — SODIUM CHLORIDE 0.9 % IV SOLN: 375.0000 mg/m2 | Freq: Once | INTRAVENOUS | Status: DC

## 2016-10-02 MED ORDER — SODIUM CHLORIDE 0.9 % IV SOLN
375.0000 mg/m2 | Freq: Once | INTRAVENOUS | Status: AC
  Administered 2016-10-02: 600 mg via INTRAVENOUS
  Filled 2016-10-02: qty 50

## 2016-10-02 MED ORDER — DIPHENHYDRAMINE HCL 25 MG PO CAPS
25.0000 mg | ORAL_CAPSULE | Freq: Once | ORAL | Status: AC
  Administered 2016-10-02: 25 mg via ORAL
  Filled 2016-10-02: qty 1

## 2016-10-02 NOTE — Progress Notes (Signed)
Offers no complaints. Feeling well. 

## 2016-10-02 NOTE — Progress Notes (Signed)
[place]  Telephone:(336) [phone] Fax:(336) [phone]  ID: Veronica Boyd OB: [birth year]  MR#: [id number]  AJG#:[id number]  Patient Care Team: Glendon Axe, MD as PCP - General (Internal Medicine) Leonie Green, MD as Referring Physician (Surgery)  CHIEF COMPLAINT: At least stage II Follicular lymphoma  INTERVAL HISTORY: Patient returns to clinic today for further evaluation and consideration of cycle 4 of 4 of weekly Rituxan. She continues to have some swelling surrounding her right clavicle, but it is much improved this week. She otherwise feels well and is asymptomatic. She has no neurologic complaints. She denies any fevers, night sweats, or weight loss. She has no chest pain or shortness of breath. She denies any nausea, vomiting, constipation, or diarrhea. She has no urinary complaint. Patient offers no further specific complaints today.  REVIEW OF SYSTEMS:   Review of Systems  Constitutional: Negative.  Negative for fever, malaise/fatigue and weight loss.  Eyes: Negative.   Respiratory: Negative.  Negative for cough and shortness of breath.   Cardiovascular: Negative.  Negative for chest pain and leg swelling.  Gastrointestinal: Negative.  Negative for abdominal pain.  Genitourinary: Negative.   Musculoskeletal: Negative.   Neurological: Negative.   Psychiatric/Behavioral: Negative.  The patient is not nervous/anxious.    As per HPI. Otherwise, a complete review of systems is negative.   PAST MEDICAL HISTORY: Past Medical History:  Diagnosis Date  . Arthritis   . Cancer (HCC)    lymph nodes  . Cataract    bilateral  . Chicken pox   . Colon polyp   . Hyperlipidemia   . Osteoporosis     PAST SURGICAL HISTORY: Past Surgical History:  Procedure Laterality Date  . AXILLARY LYMPH NODE DISSECTION Right 08/21/2016   Procedure: AXILLARY LYMPH NODE excision;  Surgeon: Leonie Green, MD;  Location: [facility] ORS;  Service: General;   Laterality: Right;  . CATARACT EXTRACTION, BILATERAL Bilateral   . JOINT REPLACEMENT Left   . TONSILLECTOMY    . TOTAL HIP ARTHROPLASTY Left 1992    FAMILY HISTORY: Family History  Problem Relation Age of Onset  . Diabetes Sister   . Lung cancer Brother   . Diabetes Brother   . Basal cell carcinoma Daughter   . Breast cancer Paternal Aunt     ADVANCED DIRECTIVES (Y/N):  N  HEALTH MAINTENANCE: Social History  Substance Use Topics  . Smoking status: Never Smoker  . Smokeless tobacco: Never Used  . Alcohol use No     Colonoscopy:  PAP:  Bone density:  Lipid panel:  No Known Allergies  Current Outpatient Prescriptions  Medication Sig Dispense Refill  . alendronate (FOSAMAX) 70 MG tablet Take 70 mg by mouth once a week. Take with a full glass of water on an empty stomach.    . Cholecalciferol (VITAMIN D-3) 1000 units CAPS Take 1,000 Units by mouth daily.    . meloxicam (MOBIC) 15 MG tablet Take 1 tablet by mouth daily.    . Multiple Vitamins-Minerals (CENTRUM SILVER PO) Take 1 tablet by mouth daily.    . ondansetron (ZOFRAN-ODT) 8 MG disintegrating tablet Take 1 tablet (8 mg total) by mouth every 8 (eight) hours as needed for nausea or vomiting. 30 tablet 2  . pantoprazole (PROTONIX) 20 MG tablet Take 1 tablet by mouth daily.    . simvastatin (ZOCOR) 20 MG tablet Take 1 tablet by mouth at bedtime.     Marland Kitchen tiZANidine (ZANAFLEX) 2 MG tablet Take 1 tablet by mouth at bedtime  as needed.     . traMADol-acetaminophen (ULTRACET) 37.5-325 MG tablet Take 1 tablet by mouth every 8 (eight) hours as needed.      No current facility-administered medications for this visit.     OBJECTIVE: Vitals:   10/02/16 0930  BP: 125/84  Pulse: 90  Resp: 18  Temp: 98 F (36.7 C)     Body mass index is 23.82 kg/m.    ECOG FS:0 - Asymptomatic  General: Well-developed, well-nourished, no acute distress. Eyes: Pink conjunctiva, anicteric sclera. Lungs: Clear to auscultation  bilaterally. Heart: Regular rate and rhythm. No rubs, murmurs, or gallops. Abdomen: Soft, nontender, nondistended. No organomegaly noted, normoactive bowel sounds. Musculoskeletal: No edema, cyanosis, or clubbing. Neuro: Alert, answering all questions appropriately. Cranial nerves grossly intact. Skin: No rashes or petechiae noted. Psych: Normal affect. Lymphatics: Easily palpable right supraclavicular and right axillary lymphadenopathy, slightly decreased in size.   LAB RESULTS:  Lab Results  Component Value Date   NA 140 10/02/2016   K 4.2 10/02/2016   CL 105 10/02/2016   CO2 28 10/02/2016   GLUCOSE 103 (H) 10/02/2016   BUN 16 10/02/2016   CREATININE 0.88 10/02/2016   CALCIUM 9.7 10/02/2016   PROT 7.3 10/02/2016   ALBUMIN 4.6 10/02/2016   AST 27 10/02/2016   ALT 20 10/02/2016   ALKPHOS 64 10/02/2016   BILITOT 0.8 10/02/2016   GFRNONAA >60 10/02/2016   GFRAA >60 10/02/2016    Lab Results  Component Value Date   WBC 7.4 10/02/2016   NEUTROABS 4.3 10/02/2016   HGB 14.8 10/02/2016   HCT 43.2 10/02/2016   MCV 91.4 10/02/2016   PLT 242 10/02/2016     STUDIES: No results found.  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  PLAN:    1. At least stage II Follicular lymphoma:  PET scan results reviewed independently consistent with stage II lymphoma. Excisional biopsy confirmed follicular lymphoma. Although patient is only grade 1-2, her Ki-67 is 75% indicating a possibly more aggressive lymphoma. After lengthy discussion with the patient and her daughter, she does not wish to pursue aggressive chemotherapy at this time, but would reconsider it in the future. She has agreed to single agent Rituxan. She continues to decline bone marrow biopsy. Proceed with cycle 4 of 4 of weekly Rituxan. Return to clinic in 1 month for follow-up CT scan.  2. Hyperglycemia: Monitor while on treatment. Good today.   Approximately 30 minutes was spent in  discussion of which greater than 50% was consultation.  Patient expressed understanding and was in agreement with this plan. She also understands that She can call clinic at any time with any questions, concerns, or complaints.   Follicular lymphoma ([place])   Staging form: Lymphoid Neoplasms, AJCC 6th Edition   - Clinical stage from 08/27/2016: Stage IIE (lymphoma only) - Signed by Lloyd Huger, MD on 08/27/2016  Jacquelin Hawking, NP   10/02/2016 1:30 PM  Patient was seen and evaluated independently and I agree with the assessment and plan as dictated above.  Lloyd Huger, MD 10/03/16 3:23 PM

## 2016-11-07 ENCOUNTER — Ambulatory Visit
Admission: RE | Admit: 2016-11-07 | Discharge: 2016-11-07 | Disposition: A | Discharge status: Home / Self Care | Payer: Medicare Other | Source: Ambulatory Visit | Attending: Oncology | Admitting: Oncology

## 2016-11-07 DIAGNOSIS — C8298 Follicular lymphoma, unspecified, lymph nodes of multiple sites: Secondary | ICD-10-CM | POA: Diagnosis present

## 2016-11-07 LAB — GLUCOSE, CAPILLARY: GLUCOSE-CAPILLARY: 97 mg/dL (ref 65–99)

## 2016-11-07 MED ORDER — FLUDEOXYGLUCOSE F - 18 (FDG) INJECTION
12.0000 | Freq: Once | INTRAVENOUS | Status: AC | PRN
  Administered 2016-11-07: 13.067 via INTRAVENOUS

## 2016-11-12 NOTE — Progress Notes (Signed)
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 Swelling surrounding her right clavicle has essentially resolved. She currently feels well and is asymptomatic. She has no neurologic complaints. She denies any fevers, night sweats, or weight loss. She has no chest pain or shortness of breath. She denies any nausea, vomiting, constipation, or diarrhea. She has no urinary complaint. Patient offers no specific complaints today.  REVIEW OF SYSTEMS:   Review of Systems  Constitutional: Negative.  Negative for fever, malaise/fatigue and weight loss.  Respiratory: Negative.  Negative for cough and shortness of breath.   Cardiovascular: Negative.  Negative for chest pain and leg swelling.  Gastrointestinal: Negative.  Negative for abdominal pain.  Genitourinary: Negative.   Musculoskeletal: Negative.   Neurological: Negative.  Negative for weakness.  Psychiatric/Behavioral: Negative.  The patient is not nervous/anxious.    As per HPI. Otherwise, a complete review of systems is negative.   PAST MEDICAL HISTORY: Past Medical History:  Diagnosis Date  . Arthritis   . Cancer (HCC)    lymph nodes  . Cataract    bilateral  . Chicken pox   . Colon polyp   . Hyperlipidemia   . Osteoporosis     PAST SURGICAL HISTORY: Past Surgical History:  Procedure Laterality Date  . AXILLARY LYMPH NODE DISSECTION Right 08/21/2016   Procedure: AXILLARY LYMPH NODE excision;  Surgeon: Leonie Green, MD;  Location: [facility] ORS;  Service: General;  Laterality: Right;  . CATARACT EXTRACTION, BILATERAL Bilateral     . JOINT REPLACEMENT Left   . TONSILLECTOMY    . TOTAL HIP ARTHROPLASTY Left 1992    FAMILY HISTORY: Family History  Problem Relation Age of Onset  . Diabetes Sister   . Lung cancer Brother   . Diabetes Brother   . Basal cell carcinoma Daughter   . Breast cancer Paternal Aunt     ADVANCED DIRECTIVES (Y/N):  N  HEALTH MAINTENANCE: Social History  Substance Use Topics  . Smoking status: Never Smoker  . Smokeless tobacco: Never Used  . Alcohol use No     Colonoscopy:  PAP:  Bone density:  Lipid panel:  No Known Allergies  Current Outpatient Prescriptions  Medication Sig Dispense Refill  . alendronate (FOSAMAX) 70 MG tablet Take 70 mg by mouth once a week. Take with a full glass of water on an empty stomach.    . Cholecalciferol (VITAMIN D-3) 1000 units CAPS Take 1,000 Units by mouth daily.    . meloxicam (MOBIC) 15 MG tablet Take 1 tablet by mouth daily.    . Multiple Vitamins-Minerals (CENTRUM SILVER PO) Take 1 tablet by mouth daily.    . ondansetron (ZOFRAN-ODT) 8 MG disintegrating tablet Take 1 tablet (8 mg total) by mouth every 8 (eight) hours as needed for nausea or vomiting. 30 tablet 2  . pantoprazole (PROTONIX) 20 MG tablet Take 1 tablet by mouth daily.    . simvastatin (ZOCOR) 20 MG tablet Take 1 tablet by mouth at bedtime.     Marland Kitchen tiZANidine (ZANAFLEX) 2 MG tablet Take 1 tablet by mouth at bedtime as needed.     . traMADol-acetaminophen (ULTRACET) 37.5-325 MG tablet Take 1  tablet by mouth every 8 (eight) hours as needed.      No current facility-administered medications for this visit.     OBJECTIVE: Vitals:   11/14/16 0954  BP: 113/77  Pulse: 90  Resp: 18  Temp: 98.5 F (36.9 C)     Body mass index is 24.36 kg/m.    ECOG FS:0 - Asymptomatic  General: Well-developed, well-nourished, no acute distress. Eyes: Pink conjunctiva, anicteric sclera. Lungs: Clear to auscultation bilaterally. Heart: Regular rate and rhythm. No rubs, murmurs, or  gallops. Abdomen: Soft, nontender, nondistended. No organomegaly noted, normoactive bowel sounds. Musculoskeletal: No edema, cyanosis, or clubbing. Neuro: Alert, answering all questions appropriately. Cranial nerves grossly intact. Skin: No rashes or petechiae noted. Psych: Normal affect. Lymphatics: No palpable right supraclavicular or right axillary lymphadenopathy.   LAB RESULTS:  Lab Results  Component Value Date   NA 140 10/02/2016   K 4.2 10/02/2016   CL 105 10/02/2016   CO2 28 10/02/2016   GLUCOSE 103 (H) 10/02/2016   BUN 16 10/02/2016   CREATININE 0.88 10/02/2016   CALCIUM 9.7 10/02/2016   PROT 7.3 10/02/2016   ALBUMIN 4.6 10/02/2016   AST 27 10/02/2016   ALT 20 10/02/2016   ALKPHOS 64 10/02/2016   BILITOT 0.8 10/02/2016   GFRNONAA >60 10/02/2016   GFRAA >60 10/02/2016    Lab Results  Component Value Date   WBC 7.4 10/02/2016   NEUTROABS 4.3 10/02/2016   HGB 14.8 10/02/2016   HCT 43.2 10/02/2016   MCV 91.4 10/02/2016   PLT 242 10/02/2016     STUDIES: Nm Pet Image Restag (ps) Skull Base To Thigh  Result Date: 11/07/2016 CLINICAL DATA:  Subsequent fifth subsequent treatment strategy for follicular lymphoma. EXAM: NUCLEAR MEDICINE PET SKULL BASE TO THIGH TECHNIQUE: 13.0 mCi F-18 FDG was injected intravenously. Full-ring PET imaging was performed from the skull base to thigh after the radiotracer. CT data was obtained and used for attenuation correction and anatomic localization. FASTING BLOOD GLUCOSE:  Value: 97 mg/dl COMPARISON:  PET-CT 08/01/2016 FINDINGS: NECK Significant interval improved appearance when compared to the prior PET-CT. The bulky 6.8 x 2.6 cm subpectoral nodal mass now measures approximately 4.6 x 1.2 cm. It was previously markedly hypermetabolic with SUV max of 71.2. There are 2 residual areas of hypermetabolism within this soft tissue with SUV max of 14.6. The small right supraclavicular node, the left subpectoral and bilateral axillary nodes no  longer demonstrate hypermetabolism. Small residual scattered nodes or present. Has a short axis diameter 10.5 mm and this was previously 14.5 mm. CHEST No enlarged or hypermetabolic mediastinal or hilar lymph nodes. No worrisome pulmonary nodules. Stable pectus deformity and mass effect on the right ventricle. Stable tortuosity, ectasia and calcification of the thoracic aorta. ABDOMEN/PELVIS No abnormal hypermetabolic activity within the liver, pancreas, adrenal glands, or spleen. No hypermetabolic lymph nodes in the abdomen or pelvis. SKELETON Lytic lesion involving the left acetabulum with soft tissue extension through the medial cortex. Sclerotic rim medially. There is hypermetabolism with SUV max of 4.37. This appears stable. I think this is most likely particle disease related to prosthesis. I think it is unlikely metastatic disease. Attention on future studies is suggested. IMPRESSION: 1. Significant interval improved appearance when compared to the prior PET-CT suggesting a good response to therapy. 2. There is persistent subpectoral nodal disease on the right side with 2 areas of hypermetabolism but all of the other areas have resolved. 1 3. No mediastinal or hilar lymphadenopathy and no abdominal/pelvic lymphadenopathy. 4.  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 Electronically Signed   By: Marijo Sanes M.D.   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  PLAN:    1. At least stage II Follicular lymphoma:  PET scan results reviewed independently and reported as above with significant interval improvement of disease. No further intervention is needed at this time. Given her excellent response to Rituxan only, it is reasonable to pursue observation only rather than maintenance Rituxan. This is also consistent with patient preference.  Previously, patient declined bone marrow biopsy. Return to clinic in 3 months with repeat imaging using CT scan and further evaluation.  2. Hyperglycemia: Mild.   Patient expressed understanding and was in agreement with this plan. She also understands that She can call clinic at any time with any questions, concerns, or complaints.   Follicular lymphoma ([place])   Staging form: Lymphoid Neoplasms, AJCC 6th Edition   - Clinical stage from 08/27/2016: Stage IIE (lymphoma only) - Signed by Lloyd Huger, MD on 08/27/2016  Lloyd Huger, MD   11/16/2016 12:58 PM

## 2016-11-14 ENCOUNTER — Inpatient Hospital Stay: Payer: Medicare Other | Attending: Oncology | Admitting: Oncology

## 2016-11-14 VITALS — BP 113/77 | HR 90 | Temp 98.5°F | Resp 18 | Wt 120.6 lb

## 2016-11-14 DIAGNOSIS — C8290 Follicular lymphoma, unspecified, unspecified site: Secondary | ICD-10-CM | POA: Diagnosis not present

## 2016-11-14 DIAGNOSIS — Z801 Family history of malignant neoplasm of trachea, bronchus and lung: Secondary | ICD-10-CM

## 2016-11-14 DIAGNOSIS — C8298 Follicular lymphoma, unspecified, lymph nodes of multiple sites: Secondary | ICD-10-CM

## 2016-11-14 DIAGNOSIS — M818 Other osteoporosis without current pathological fracture: Secondary | ICD-10-CM | POA: Insufficient documentation

## 2016-11-14 DIAGNOSIS — R739 Hyperglycemia, unspecified: Secondary | ICD-10-CM | POA: Diagnosis not present

## 2016-11-14 DIAGNOSIS — E785 Hyperlipidemia, unspecified: Secondary | ICD-10-CM | POA: Diagnosis not present

## 2016-11-14 DIAGNOSIS — M129 Arthropathy, unspecified: Secondary | ICD-10-CM | POA: Diagnosis not present

## 2016-11-14 DIAGNOSIS — H269 Unspecified cataract: Secondary | ICD-10-CM | POA: Diagnosis not present

## 2016-11-14 DIAGNOSIS — Z86010 Personal history of colonic polyps: Secondary | ICD-10-CM | POA: Diagnosis not present

## 2016-11-14 DIAGNOSIS — Z803 Family history of malignant neoplasm of breast: Secondary | ICD-10-CM | POA: Diagnosis not present

## 2016-11-14 DIAGNOSIS — Z79899 Other long term (current) drug therapy: Secondary | ICD-10-CM | POA: Diagnosis not present

## 2016-11-14 NOTE — Progress Notes (Signed)
Patient is here for follow up, she is doing well no complaints.  

## 2017-02-12 ENCOUNTER — Ambulatory Visit
Admission: RE | Admit: 2017-02-12 | Discharge: 2017-02-12 | Disposition: A | Discharge status: Home / Self Care | Payer: Medicare Other | Source: Ambulatory Visit | Attending: Oncology | Admitting: Oncology

## 2017-02-12 DIAGNOSIS — C8298 Follicular lymphoma, unspecified, lymph nodes of multiple sites: Secondary | ICD-10-CM | POA: Diagnosis present

## 2017-02-12 DIAGNOSIS — R590 Localized enlarged lymph nodes: Secondary | ICD-10-CM | POA: Insufficient documentation

## 2017-02-12 HISTORY — DX: Follicular lymphoma, unspecified, unspecified site: C82.90

## 2017-02-12 LAB — POCT I-STAT CREATININE: CREATININE: 0.9 mg/dL (ref 0.44–1.00)

## 2017-02-12 MED ORDER — IOPAMIDOL (ISOVUE-300) INJECTION 61%
85.0000 mL | Freq: Once | INTRAVENOUS | Status: AC | PRN
  Administered 2017-02-12: 85 mL via INTRAVENOUS

## 2017-02-14 ENCOUNTER — Other Ambulatory Visit: Payer: Medicare Other

## 2017-02-14 ENCOUNTER — Ambulatory Visit: Payer: Medicare Other | Admitting: Oncology

## 2017-02-27 NOTE — Progress Notes (Signed)
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 She currently feels well and is asymptomatic. She has noted no new lymphadenopathy. She has no neurologic complaints. She denies any fevers, night sweats, or weight loss. She has no chest pain or shortness of breath. She denies any nausea, vomiting, constipation, or diarrhea. She has no urinary complaint. Patient offers no specific complaints today.  REVIEW OF SYSTEMS:   Review of Systems  Constitutional: Negative.  Negative for fever, malaise/fatigue and weight loss.  Respiratory: Negative.  Negative for cough and shortness of breath.   Cardiovascular: Negative.  Negative for chest pain and leg swelling.  Gastrointestinal: Negative.  Negative for abdominal pain.  Genitourinary: Negative.   Musculoskeletal: Negative.   Neurological: Negative.  Negative for weakness.  Psychiatric/Behavioral: Negative.  The patient is not nervous/anxious.    As per HPI. Otherwise, a complete review of systems is negative.   PAST MEDICAL HISTORY: Past Medical History:  Diagnosis Date  . Arthritis   . Cataract    bilateral  . Chicken pox   . Colon polyp   . Follicular lymphoma ([place]) 08/2016   lymph nodes   . Hyperlipidemia   . Osteoporosis     PAST SURGICAL HISTORY: Past Surgical History:  Procedure Laterality Date  . AXILLARY LYMPH NODE DISSECTION Right 08/21/2016   Procedure: AXILLARY LYMPH NODE excision;  Surgeon: Leonie Green, MD;  Location: [facility] ORS;  Service: General;  Laterality: Right;  . CATARACT EXTRACTION, BILATERAL Bilateral   .  JOINT REPLACEMENT Left   . TONSILLECTOMY    . TOTAL HIP ARTHROPLASTY Left 1992    FAMILY HISTORY: Family History  Problem Relation Age of Onset  . Diabetes Sister   . Lung cancer Brother   . Diabetes Brother   . Basal cell carcinoma Daughter   . Breast cancer Paternal Aunt     ADVANCED DIRECTIVES (Y/N):  N  HEALTH MAINTENANCE: Social History  Substance Use Topics  . Smoking status: Never Smoker  . Smokeless tobacco: Never Used  . Alcohol use No     Colonoscopy:  PAP:  Bone density:  Lipid panel:  No Known Allergies  Current Outpatient Prescriptions  Medication Sig Dispense Refill  . alendronate (FOSAMAX) 70 MG tablet Take 70 mg by mouth once a week. Take with a full glass of water on an empty stomach.    . Cholecalciferol (VITAMIN D-3) 1000 units CAPS Take 1,000 Units by mouth daily.    . meloxicam (MOBIC) 15 MG tablet Take 1 tablet by mouth daily.    . Multiple Vitamins-Minerals (CENTRUM SILVER PO) Take 1 tablet by mouth daily.    . ondansetron (ZOFRAN-ODT) 8 MG disintegrating tablet Take 1 tablet (8 mg total) by mouth every 8 (eight) hours as needed for nausea or vomiting. 30 tablet 2  . pantoprazole (PROTONIX) 20 MG tablet Take 1 tablet by mouth daily.    . simvastatin (ZOCOR) 20 MG tablet Take 1 tablet by mouth at bedtime.     Marland Kitchen tiZANidine (ZANAFLEX) 2 MG tablet Take 1 tablet by mouth at bedtime as needed.     . traMADol-acetaminophen (ULTRACET) 37.5-325 MG tablet Take 1 tablet  by mouth every 8 (eight) hours as needed.      No current facility-administered medications for this visit.     OBJECTIVE: Vitals:   02/28/17 0944  BP: 133/87  Pulse: 76  Resp: 18  Temp: 98.2 F (36.8 C)     Body mass index is 24.44 kg/m.    ECOG FS:0 - Asymptomatic  General: Well-developed, well-nourished, no acute distress. Eyes: Pink conjunctiva, anicteric sclera. Lungs: Clear to auscultation bilaterally. Heart: Regular rate and rhythm. No rubs, murmurs, or gallops. Abdomen:  Soft, nontender, nondistended. No organomegaly noted, normoactive bowel sounds. Musculoskeletal: No edema, cyanosis, or clubbing. Neuro: Alert, answering all questions appropriately. Cranial nerves grossly intact. Skin: No rashes or petechiae noted. Psych: Normal affect. Lymphatics: No palpable right supraclavicular or right axillary lymphadenopathy.   LAB RESULTS:  Lab Results  Component Value Date   NA 141 02/28/2017   K 4.2 02/28/2017   CL 106 02/28/2017   CO2 29 02/28/2017   GLUCOSE 93 02/28/2017   BUN 15 02/28/2017   CREATININE 0.83 02/28/2017   CALCIUM 9.6 02/28/2017   PROT 7.5 02/28/2017   ALBUMIN 4.6 02/28/2017   AST 29 02/28/2017   ALT 20 02/28/2017   ALKPHOS 68 02/28/2017   BILITOT 0.7 02/28/2017   GFRNONAA >60 02/28/2017   GFRAA >60 02/28/2017    Lab Results  Component Value Date   WBC 4.1 02/28/2017   NEUTROABS 1.6 02/28/2017   HGB 15.1 02/28/2017   HCT 44.0 02/28/2017   MCV 91.5 02/28/2017   PLT 228 02/28/2017     STUDIES: Ct Chest W Contrast  Result Date: 02/12/2017 CLINICAL DATA:  Restaging follicular lymphoma, status post excision of right axillary lymph node, on maintenance Rituxan EXAM: CT CHEST, ABDOMEN, AND PELVIS WITH CONTRAST TECHNIQUE: Multidetector CT imaging of the chest, abdomen and pelvis was performed following the standard protocol during bolus administration of intravenous contrast. CONTRAST:  36m ISOVUE-300 IOPAMIDOL (ISOVUE-300) INJECTION 61% COMPARISON:  PET-CT dated 11/07/2016 FINDINGS: CT CHEST FINDINGS Cardiovascular: Heart is normal in size.  No pericardial effusion. Mild coronary atherosclerosis in the LAD (series 2/ image 32). Atherosclerotic calcifications of the aortic arch. Mediastinum/Nodes: 14 mm short axis right subpectoral node (series 2/image 16), better visualized on the current enhanced CT than on prior unenhanced PET, but likely grossly unchanged. 14 mm short axis right axillary node (series 2/image 20), previously 11 mm.  Additional scattered right axillary nodes are slightly less prominent. No suspicious mediastinal or hilar lymphadenopathy. Visualized thyroid is unremarkable. Lungs/Pleura: No suspicious pulmonary nodules. Mild biapical pleural-parenchymal scarring. No focal consolidation. No pleural effusion or pneumothorax. Musculoskeletal: Visualized osseous structures are within normal limits. CT ABDOMEN PELVIS FINDINGS Hepatobiliary: Liver is within normal limits. No suspicious/enhancing hepatic lesions. Gallbladder is unremarkable. No intrahepatic or extrahepatic ductal dilatation. Pancreas: Within normal limits. Spleen: Spleen is normal in size. Adrenals/Urinary Tract: Mild thickening of the bilateral adrenal glands, without discrete mass. Kidneys are within normal limits.  No hydronephrosis. Bladder is within normal limits. Stomach/Bowel: Stomach is within normal limits. No evidence of bowel obstruction. Normal appendix (series 2/ image 95). Left colonic diverticulosis, without evidence of diverticulitis. Vascular/Lymphatic: No evidence of abdominal aortic aneurysm. Atherosclerotic calcifications of the abdominal aorta and branch vessels. No suspicious abdominopelvic lymphadenopathy. Reproductive: Uterus is unremarkable. Bilateral ovaries are within normal limits. Other: No abdominopelvic ascites. Musculoskeletal: Mild degenerative changes of the lumbar spine. Left hip arthroplasty. 3.1 x 2.3 cm soft tissue lesion along the left superior acetabulum (series 2/image 95), grossly unchanged. Given an adjacent lytic  lesion without soft tissue component (coronal image 87), particle disease is favored. IMPRESSION: Mild right subpectoral and right axillary lymphadenopathy, overall grossly unchanged. 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 No evidence of new/progressive lymphoma. Electronically Signed   By: Julian Hy M.D.   On: 02/12/2017 13:42    Ct Abdomen Pelvis W Contrast  Result Date: 02/12/2017 CLINICAL DATA:  Restaging follicular lymphoma, status post excision of right axillary lymph node, on maintenance Rituxan EXAM: CT CHEST, ABDOMEN, AND PELVIS WITH CONTRAST TECHNIQUE: Multidetector CT imaging of the chest, abdomen and pelvis was performed following the standard protocol during bolus administration of intravenous contrast. CONTRAST:  70m ISOVUE-300 IOPAMIDOL (ISOVUE-300) INJECTION 61% COMPARISON:  PET-CT dated 11/07/2016 FINDINGS: CT CHEST FINDINGS Cardiovascular: Heart is normal in size.  No pericardial effusion. Mild coronary atherosclerosis in the LAD (series 2/ image 32). Atherosclerotic calcifications of the aortic arch. Mediastinum/Nodes: 14 mm short axis right subpectoral node (series 2/image 16), better visualized on the current enhanced CT than on prior unenhanced PET, but likely grossly unchanged. 14 mm short axis right axillary node (series 2/image 20), previously 11 mm. Additional scattered right axillary nodes are slightly less prominent. No suspicious mediastinal or hilar lymphadenopathy. Visualized thyroid is unremarkable. Lungs/Pleura: No suspicious pulmonary nodules. Mild biapical pleural-parenchymal scarring. No focal consolidation. No pleural effusion or pneumothorax. Musculoskeletal: Visualized osseous structures are within normal limits. CT ABDOMEN PELVIS FINDINGS Hepatobiliary: Liver is within normal limits. No suspicious/enhancing hepatic lesions. Gallbladder is unremarkable. No intrahepatic or extrahepatic ductal dilatation. Pancreas: Within normal limits. Spleen: Spleen is normal in size. Adrenals/Urinary Tract: Mild thickening of the bilateral adrenal glands, without discrete mass. Kidneys are within normal limits.  No hydronephrosis. Bladder is within normal limits. Stomach/Bowel: Stomach is within normal limits. No evidence of bowel obstruction. Normal appendix (series 2/ image 95). Left colonic diverticulosis,  without evidence of diverticulitis. Vascular/Lymphatic: No evidence of abdominal aortic aneurysm. Atherosclerotic calcifications of the abdominal aorta and branch vessels. No suspicious abdominopelvic lymphadenopathy. Reproductive: Uterus is unremarkable. Bilateral ovaries are within normal limits. Other: No abdominopelvic ascites. Musculoskeletal: Mild degenerative changes of the lumbar spine. Left hip arthroplasty. 3.1 x 2.3 cm soft tissue lesion along the left superior acetabulum (series 2/image 95), grossly unchanged. Given an adjacent lytic lesion without soft tissue component (coronal image 87), particle disease is favored. IMPRESSION: Mild right subpectoral and right axillary lymphadenopathy, overall grossly unchanged. 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 No evidence of new/progressive lymphoma. Electronically Signed   By: SJulian HyM.D.   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  PLAN:    1. At least stage II Follicular lymphoma:  CT scan results reviewed independently and reported as above with no evidence of recurrent or progressive disease. No further intervention is needed at this time. Given her excellent response to Rituxan only, it is reasonable to pursue observation only rather than maintenance Rituxan. This is also consistent with patient preference. Previously, patient declined bone marrow biopsy. Return to clinic in 4 months with repeat imaging using CT scan and further evaluation.  2. Hyperglycemia: Resolved.  Approximately 20 minutes was spent in discussion of which greater than 50% was consultation.   Patient expressed understanding and was in agreement with this plan. She also understands that She can call clinic at any time with any questions, concerns, or complaints.  Cancer Staging Follicular lymphoma ([place]) Staging form: Lymphoid  Neoplasms, AJCC 6th Edition - Clinical stage from 08/27/2016: Stage IIE (lymphoma only) - Signed by Lloyd Huger, MD on 08/27/2016   Lloyd Huger, MD   03/02/2017 5:19 PM

## 2017-02-28 ENCOUNTER — Inpatient Hospital Stay (HOSPITAL_BASED_OUTPATIENT_CLINIC_OR_DEPARTMENT_OTHER): Payer: Medicare Other | Admitting: Oncology

## 2017-02-28 ENCOUNTER — Inpatient Hospital Stay: Payer: Medicare Other | Attending: Oncology

## 2017-02-28 VITALS — BP 133/87 | HR 76 | Temp 98.2°F | Resp 18 | Wt 121.0 lb

## 2017-02-28 DIAGNOSIS — Z803 Family history of malignant neoplasm of breast: Secondary | ICD-10-CM | POA: Diagnosis not present

## 2017-02-28 DIAGNOSIS — M129 Arthropathy, unspecified: Secondary | ICD-10-CM

## 2017-02-28 DIAGNOSIS — E785 Hyperlipidemia, unspecified: Secondary | ICD-10-CM | POA: Insufficient documentation

## 2017-02-28 DIAGNOSIS — Z79899 Other long term (current) drug therapy: Secondary | ICD-10-CM

## 2017-02-28 DIAGNOSIS — C8218 Follicular lymphoma grade II, lymph nodes of multiple sites: Secondary | ICD-10-CM | POA: Diagnosis not present

## 2017-02-28 DIAGNOSIS — Z801 Family history of malignant neoplasm of trachea, bronchus and lung: Secondary | ICD-10-CM | POA: Diagnosis not present

## 2017-02-28 DIAGNOSIS — Z86010 Personal history of colonic polyps: Secondary | ICD-10-CM | POA: Diagnosis not present

## 2017-02-28 DIAGNOSIS — I2510 Atherosclerotic heart disease of native coronary artery without angina pectoris: Secondary | ICD-10-CM

## 2017-02-28 DIAGNOSIS — C8298 Follicular lymphoma, unspecified, lymph nodes of multiple sites: Secondary | ICD-10-CM

## 2017-02-28 DIAGNOSIS — M818 Other osteoporosis without current pathological fracture: Secondary | ICD-10-CM | POA: Diagnosis not present

## 2017-02-28 LAB — COMPREHENSIVE METABOLIC PANEL
ALBUMIN: 4.6 g/dL (ref 3.5–5.0)
ALK PHOS: 68 U/L (ref 38–126)
ALT: 20 U/L (ref 14–54)
AST: 29 U/L (ref 15–41)
Anion gap: 6 (ref 5–15)
BUN: 15 mg/dL (ref 6–20)
CO2: 29 mmol/L (ref 22–32)
Calcium: 9.6 mg/dL (ref 8.9–10.3)
Chloride: 106 mmol/L (ref 101–111)
Creatinine, Ser: 0.83 mg/dL (ref 0.44–1.00)
GFR calc Af Amer: >60 mL/min (ref >60)
GFR calc non Af Amer: >60 mL/min (ref >60)
GLUCOSE: 93 mg/dL (ref 65–99)
POTASSIUM: 4.2 mmol/L (ref 3.5–5.1)
SODIUM: 141 mmol/L (ref 135–145)
TOTAL PROTEIN: 7.5 g/dL (ref 6.5–8.1)
Total Bilirubin: 0.7 mg/dL (ref 0.3–1.2)

## 2017-02-28 LAB — CBC WITH DIFFERENTIAL/PLATELET
BASOS ABS: 0 10*3/uL (ref 0–0.1)
Basophils Relative: 0 %
EOS ABS: 0.2 10*3/uL (ref 0–0.7)
EOS PCT: 5 %
HCT: 44 % (ref 35.0–47.0)
HEMOGLOBIN: 15.1 g/dL (ref 12.0–16.0)
LYMPHS ABS: 1.5 10*3/uL (ref 1.0–3.6)
Lymphocytes Relative: 37 %
MCH: 31.4 pg (ref 26.0–34.0)
MCHC: 34.3 g/dL (ref 32.0–36.0)
MCV: 91.5 fL (ref 80.0–100.0)
Monocytes Absolute: 0.7 10*3/uL (ref 0.2–0.9)
Monocytes Relative: 18 %
NEUTROS PCT: 40 %
Neutro Abs: 1.6 10*3/uL (ref 1.4–6.5)
PLATELETS: 228 10*3/uL (ref 150–440)
RBC: 4.81 MIL/uL (ref 3.80–5.20)
RDW: 13.5 % (ref 11.5–14.5)
WBC: 4.1 10*3/uL (ref 3.6–11.0)

## 2017-02-28 NOTE — Progress Notes (Signed)
Offers no complaints  

## 2017-04-03 ENCOUNTER — Telehealth: Payer: Self-pay | Admitting: *Deleted

## 2017-04-03 DIAGNOSIS — R2231 Localized swelling, mass and lump, right upper limb: Secondary | ICD-10-CM | POA: Insufficient documentation

## 2017-04-03 NOTE — Telephone Encounter (Signed)
Per VO Dr Grayland Ormond wants to see her after she sees Dr Tamala Julian. I relayed this message to Shirlean Mylar and she will call back when she knows the appt with Dr Tamala Julian

## 2017-04-03 NOTE — Telephone Encounter (Signed)
Called to report she saw her PCP today and she has a Lump feeling like a fullness of her collar bone area also has axillary right 6 cm lump She is sending her to see Dr Tamala Julian ans would like her to see Dr Grayland Ormond ASAP also.Please advise

## 2017-04-11 ENCOUNTER — Telehealth: Payer: Self-pay | Admitting: *Deleted

## 2017-04-11 NOTE — Telephone Encounter (Signed)
Per VO Dr Grayland Ormond, he will see her next Wednesday or Thursday

## 2017-04-11 NOTE — Telephone Encounter (Signed)
Daughter states she needs appt with Dr Grayland Ormond after seeing Dr Loyce Dys  PLAN:.   I discussed with her the possible need for further biopsy. We will however refer her back to Dr. Grayland Ormond for his recommendations regarding treatment of lymphoma.  I discussed taken some ibuprofen and/or Tylenol and/or tramadol if needed for pain.  Patient Instructions  Take ibuprofen and/or Tylenol and/or tramadol if needed for pain.  Should not drive or do anything dangerous when taking tramadol.  Make appointment to see Dr. Grayland Ormond in the office for follow-up with regard to follicular lymphoma the right axilla and right chest wall.  Rochel Brome, MD  Electronically signed by Rochel Brome, MD

## 2017-04-16 NOTE — Progress Notes (Signed)
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 She otherwise feels well and is asymptomatic. She has right arm pain secondary to her axillary mass. She has no neurologic complaints. She denies any fevers, night sweats, or weight loss. She has no chest pain or shortness of breath. She denies any nausea, vomiting, constipation, or diarrhea. She has no urinary complaint. Patient offers no further specific complaints today.  REVIEW OF SYSTEMS:   Review of Systems  Constitutional: Negative.  Negative for fever, malaise/fatigue and weight loss.  Respiratory: Negative.  Negative for cough and shortness of breath.   Cardiovascular: Negative.  Negative for chest pain and leg swelling.  Gastrointestinal: Negative.  Negative for abdominal pain.  Genitourinary: Negative.   Musculoskeletal:       Right arm pain  Neurological: Negative.  Negative for sensory change and weakness.  Psychiatric/Behavioral: Negative.  The patient is not nervous/anxious.    As per HPI. Otherwise, a complete review of systems is negative.   PAST MEDICAL HISTORY: Past Medical History:  Diagnosis Date  . Arthritis   . Cataract    bilateral  . Chicken pox   . Colon polyp   . Follicular lymphoma ([place]) 08/2016   lymph nodes   . Hyperlipidemia   . Osteoporosis     PAST SURGICAL HISTORY: Past Surgical History:  Procedure Laterality Date  . AXILLARY LYMPH NODE DISSECTION Right 08/21/2016   Procedure: AXILLARY LYMPH NODE excision;  Surgeon: Leonie Green, MD;  Location: [facility]  ORS;  Service: General;  Laterality: Right;  . CATARACT EXTRACTION, BILATERAL Bilateral   . JOINT REPLACEMENT Left   . TONSILLECTOMY    . TOTAL HIP ARTHROPLASTY Left 1992    FAMILY HISTORY: Family History  Problem Relation Age of Onset  . Diabetes Sister   . Lung cancer Brother   . Diabetes Brother   . Basal cell carcinoma Daughter   . Breast cancer Paternal Aunt     ADVANCED DIRECTIVES (Y/N):  N  HEALTH MAINTENANCE: Social History  Substance Use Topics  . Smoking status: Never Smoker  . Smokeless tobacco: Never Used  . Alcohol use No     Colonoscopy:  PAP:  Bone density:  Lipid panel:  No Known Allergies  Current Outpatient Prescriptions  Medication Sig Dispense Refill  . alendronate (FOSAMAX) 70 MG tablet Take 70 mg by mouth once a week. Take with a full glass of water on an empty stomach.    . Cholecalciferol (VITAMIN D-3) 1000 units CAPS Take 1,000 Units by mouth daily.    . meloxicam (MOBIC) 15 MG tablet Take 1 tablet by mouth daily.    . Multiple Vitamins-Minerals (CENTRUM SILVER PO) Take 1 tablet by mouth daily.    . ondansetron (ZOFRAN-ODT) 8 MG disintegrating tablet Take 1 tablet (8 mg total) by mouth every 8 (eight) hours as needed for nausea or vomiting. 30 tablet 2  . pantoprazole (PROTONIX) 20 MG tablet Take 1 tablet by mouth daily.    . simvastatin (ZOCOR) 20 MG tablet Take 1 tablet by mouth at bedtime.     Marland Kitchen tiZANidine (ZANAFLEX) 2 MG tablet  Take 1 tablet by mouth at bedtime as needed.     . traMADol-acetaminophen (ULTRACET) 37.5-325 MG tablet Take 1 tablet by mouth every 8 (eight) hours as needed.      No current facility-administered medications for this visit.     OBJECTIVE: Vitals:   04/17/17 1128  BP: 129/83  Pulse: 85  Resp: 20  Temp: 97.5 F (36.4 C)     Body mass index is 24.52 kg/m.    ECOG FS:0 - Asymptomatic  General: Well-developed, well-nourished, no acute distress. Eyes: Pink conjunctiva, anicteric sclera. Lungs: Clear to  auscultation bilaterally. Heart: Regular rate and rhythm. No rubs, murmurs, or gallops. Abdomen: Soft, nontender, nondistended. No organomegaly noted, normoactive bowel sounds. Musculoskeletal: No edema, cyanosis, or clubbing. Neuro: Alert, answering all questions appropriately. Cranial nerves grossly intact. Skin: No rashes or petechiae noted. Psych: Normal affect. Lymphatics: Easily palpable lymphadenopathy in right axilla. Fullness noted surrounding right clavicle.  LAB RESULTS:  Lab Results  Component Value Date   NA 141 02/28/2017   K 4.2 02/28/2017   CL 106 02/28/2017   CO2 29 02/28/2017   GLUCOSE 93 02/28/2017   BUN 15 02/28/2017   CREATININE 0.83 02/28/2017   CALCIUM 9.6 02/28/2017   PROT 7.5 02/28/2017   ALBUMIN 4.6 02/28/2017   AST 29 02/28/2017   ALT 20 02/28/2017   ALKPHOS 68 02/28/2017   BILITOT 0.7 02/28/2017   GFRNONAA >60 02/28/2017   GFRAA >60 02/28/2017    Lab Results  Component Value Date   WBC 4.1 02/28/2017   NEUTROABS 1.6 02/28/2017   HGB 15.1 02/28/2017   HCT 44.0 02/28/2017   MCV 91.5 02/28/2017   PLT 228 02/28/2017     STUDIES: Nm Pet Image Restag (ps) Skull Base To Thigh  Result Date: 04/19/2017 CLINICAL DATA:  Subsequent treatment strategy for follicular lymphoma diagnosed October 2017 status post Rituxan. EXAM: NUCLEAR MEDICINE PET SKULL BASE TO THIGH TECHNIQUE: 12.6 mCi F-18 FDG was injected intravenously. Full-ring PET imaging was performed from the skull base to thigh after the radiotracer. CT data was obtained and used for attenuation correction and anatomic localization. FASTING BLOOD GLUCOSE:  Value: 105 mg/dl COMPARISON:  02/12/2017 CT chest, abdomen and pelvis. 11/07/2016 PET-CT. FINDINGS: NECK Newly enlarged hypermetabolic 1.7 cm left supraclavicular neck node with max SUV 19.2 (series 3/ image 63). No additional enlarged or hypermetabolic neck lymph nodes. CHEST Multiple new bulky hypermetabolic right axillary lymph nodes, largest 4.3  cm with max SUV 28.1 (series 3/ image 77). Hypermetabolic enlarged 1.5 cm right retropectoral node with max SUV 14.7 (series 3/image 74), previously 1.1 cm with max SUV 14.7 on 11/07/2016, mildly increased in size and stable in metabolism. New hypermetabolic left retropectoral adenopathy, for example an enlarged 1.2 cm left retropectoral node with max SUV 11.2 (series 3/image 74). New mildly hypermetabolic left axillary adenopathy, for example a mildly enlarged 1.0 cm left axillary node with max SUV 3.6 (series 3/image 73). No enlarged or hypermetabolic mediastinal or hilar nodes. Atherosclerotic nonaneurysmal thoracic aorta. Left anterior descending coronary atherosclerosis. No pneumothorax. No pleural effusions. No acute consolidative airspace disease, lung masses or significant pulmonary nodules. ABDOMEN/PELVIS No abnormal hypermetabolic activity within the liver, pancreas, adrenal glands, or spleen. No hypermetabolic lymph nodes in the abdomen or pelvis. Atherosclerotic nonaneurysmal abdominal aorta. Mild scattered colonic diverticulosis. SKELETON No focal hypermetabolic activity to suggest skeletal metastasis. Status post left total hip arthroplasty. Mildly hypermetabolic (max SUV 4.4) lytic 3.2 x 2.5 cm superomedial left acetabular lesion (series 3/image 209), previously 3.3  x 2.6 cm with max SUV 4.4 on 11/07/2016, stable in size and metabolism. IMPRESSION: 1. Significant metabolic and size progression of bulky lymphoma involving the right greater than left axillary, bilateral retropectoral and left supraclavicular nodal chains. 2. No metabolic evidence of abdominopelvic or skeletal lymphoma . 3. 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 Electronically Signed   By: Ilona Sorrel M.D.   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  PLAN:    1. At least stage II Follicular lymphoma: PET scan results from April 20, 2017 reviewed independently and reported as above with significant progression of bulky lymphadenopathy in right and left axilla as well as bilateral supraclavicular nodal chains. Given the extent of disease XRT alone will not suffice, but patient may benefit from palliative XRT to her painful right axillary node. Patient received 4 cycles of single agent rituximab completing on October 02, 2016. Patient will likely need more aggressive treatment possibly using Treanda plus rituximab. Previously, patient declined bone marrow biopsy. Return to clinic in 1 week for radiation oncology consultation, further evaluation and treatment planning.  2. Hyperglycemia: Resolved.  Approximately 30 minutes was spent in discussion of which greater than 50% was consultation.   Patient expressed understanding and was in agreement with this plan. She also understands that She can call clinic at any time with any questions, concerns, or complaints.   Cancer Staging Follicular lymphoma ([place]) Staging form: Lymphoid Neoplasms, AJCC 6th Edition - Clinical stage from 08/27/2016: Stage IIE (lymphoma only) - Signed by Lloyd Huger, MD on 08/27/2016   Lloyd Huger, MD   04/19/2017 2:48 PM

## 2017-04-17 ENCOUNTER — Inpatient Hospital Stay: Payer: Medicare Other | Attending: Oncology | Admitting: Oncology

## 2017-04-17 VITALS — BP 129/83 | HR 85 | Temp 97.5°F | Resp 20 | Wt 121.4 lb

## 2017-04-17 DIAGNOSIS — Z85828 Personal history of other malignant neoplasm of skin: Secondary | ICD-10-CM | POA: Insufficient documentation

## 2017-04-17 DIAGNOSIS — Z86010 Personal history of colonic polyps: Secondary | ICD-10-CM | POA: Diagnosis not present

## 2017-04-17 DIAGNOSIS — Z803 Family history of malignant neoplasm of breast: Secondary | ICD-10-CM | POA: Insufficient documentation

## 2017-04-17 DIAGNOSIS — C8290 Follicular lymphoma, unspecified, unspecified site: Secondary | ICD-10-CM | POA: Diagnosis not present

## 2017-04-17 DIAGNOSIS — M129 Arthropathy, unspecified: Secondary | ICD-10-CM | POA: Insufficient documentation

## 2017-04-17 DIAGNOSIS — Z801 Family history of malignant neoplasm of trachea, bronchus and lung: Secondary | ICD-10-CM | POA: Diagnosis not present

## 2017-04-17 DIAGNOSIS — M818 Other osteoporosis without current pathological fracture: Secondary | ICD-10-CM

## 2017-04-17 DIAGNOSIS — R222 Localized swelling, mass and lump, trunk: Secondary | ICD-10-CM | POA: Insufficient documentation

## 2017-04-17 DIAGNOSIS — M79601 Pain in right arm: Secondary | ICD-10-CM

## 2017-04-17 DIAGNOSIS — E785 Hyperlipidemia, unspecified: Secondary | ICD-10-CM | POA: Diagnosis not present

## 2017-04-17 DIAGNOSIS — C8298 Follicular lymphoma, unspecified, lymph nodes of multiple sites: Secondary | ICD-10-CM

## 2017-04-17 NOTE — Progress Notes (Signed)
Patient denies any concerns today.  

## 2017-04-19 ENCOUNTER — Ambulatory Visit
Admission: RE | Admit: 2017-04-19 | Discharge: 2017-04-19 | Disposition: A | Discharge status: Home / Self Care | Payer: Medicare Other | Source: Ambulatory Visit | Attending: Oncology | Admitting: Oncology

## 2017-04-19 DIAGNOSIS — M898X5 Other specified disorders of bone, thigh: Secondary | ICD-10-CM | POA: Diagnosis not present

## 2017-04-19 DIAGNOSIS — Z96642 Presence of left artificial hip joint: Secondary | ICD-10-CM | POA: Diagnosis not present

## 2017-04-19 DIAGNOSIS — C8298 Follicular lymphoma, unspecified, lymph nodes of multiple sites: Secondary | ICD-10-CM | POA: Diagnosis present

## 2017-04-19 LAB — GLUCOSE, CAPILLARY: Glucose-Capillary: 105 mg/dL — ABNORMAL HIGH (ref 65–99)

## 2017-04-19 MED ORDER — FLUDEOXYGLUCOSE F - 18 (FDG) INJECTION
12.6400 | Freq: Once | INTRAVENOUS | Status: AC | PRN
  Administered 2017-04-19: 12.64 via INTRAVENOUS

## 2017-04-30 ENCOUNTER — Inpatient Hospital Stay (HOSPITAL_BASED_OUTPATIENT_CLINIC_OR_DEPARTMENT_OTHER): Payer: Medicare Other | Admitting: Oncology

## 2017-04-30 ENCOUNTER — Ambulatory Visit
Admission: RE | Admit: 2017-04-30 | Discharge: 2017-04-30 | Disposition: A | Discharge status: Home / Self Care | Payer: Medicare Other | Source: Ambulatory Visit | Attending: Radiation Oncology | Admitting: Radiation Oncology

## 2017-04-30 ENCOUNTER — Encounter: Payer: Self-pay | Admitting: Radiation Oncology

## 2017-04-30 VITALS — BP 135/82 | HR 93 | Temp 98.0°F | Wt 120.3 lb

## 2017-04-30 DIAGNOSIS — C8298 Follicular lymphoma, unspecified, lymph nodes of multiple sites: Secondary | ICD-10-CM

## 2017-04-30 DIAGNOSIS — M818 Other osteoporosis without current pathological fracture: Secondary | ICD-10-CM | POA: Diagnosis not present

## 2017-04-30 DIAGNOSIS — C8290 Follicular lymphoma, unspecified, unspecified site: Secondary | ICD-10-CM | POA: Diagnosis not present

## 2017-04-30 DIAGNOSIS — Z86010 Personal history of colonic polyps: Secondary | ICD-10-CM

## 2017-04-30 DIAGNOSIS — Z801 Family history of malignant neoplasm of trachea, bronchus and lung: Secondary | ICD-10-CM | POA: Diagnosis not present

## 2017-04-30 DIAGNOSIS — Z510 Encounter for antineoplastic radiation therapy: Secondary | ICD-10-CM | POA: Diagnosis not present

## 2017-04-30 DIAGNOSIS — Z85828 Personal history of other malignant neoplasm of skin: Secondary | ICD-10-CM | POA: Diagnosis not present

## 2017-04-30 DIAGNOSIS — E785 Hyperlipidemia, unspecified: Secondary | ICD-10-CM

## 2017-04-30 DIAGNOSIS — R222 Localized swelling, mass and lump, trunk: Secondary | ICD-10-CM | POA: Diagnosis not present

## 2017-04-30 DIAGNOSIS — M129 Arthropathy, unspecified: Secondary | ICD-10-CM | POA: Diagnosis not present

## 2017-04-30 DIAGNOSIS — C8218 Follicular lymphoma grade II, lymph nodes of multiple sites: Secondary | ICD-10-CM | POA: Diagnosis present

## 2017-04-30 DIAGNOSIS — Z808 Family history of malignant neoplasm of other organs or systems: Secondary | ICD-10-CM | POA: Diagnosis not present

## 2017-04-30 DIAGNOSIS — M79601 Pain in right arm: Secondary | ICD-10-CM

## 2017-04-30 DIAGNOSIS — Z803 Family history of malignant neoplasm of breast: Secondary | ICD-10-CM | POA: Diagnosis not present

## 2017-04-30 NOTE — Progress Notes (Signed)
NEW PATIENT EVALUATION  Name: Veronica Boyd  MRN: [id number]  Date:   04/30/2017     DOB: August 24, 1946   This 71 y.o. female patient presents to the clinic for initial evaluation of progressive B-cell lymphoma with prominent adenopathy in the right axilla.  REFERRING PHYSICIAN: Glendon Axe, MD  CHIEF COMPLAINT:  Chief Complaint  Patient presents with  . Lymphoma    DIAGNOSIS: The encounter diagnosis was Follicular lymphoma of lymph nodes of multiple sites [facility]).   PREVIOUS INVESTIGATIONS:  PET CT scans reviewed Pathology reports reviewed Clinical notes reviewed  HPI: Patient is a 71 year old female who's diagnosis goes back approximate 1 year which was diagnosed with a least stage II JJ94 positive follicular lymphoma low-grade 1-2 diagnosed from a right axillary lymph node. Patient has received 4 cycles of Rituxan only with good response although recently has developed enlarging lymph nodes causing discomfort in her right axilla. PET CT scan shows progression of bulky lymphadenopathy in the right and left axilla as well as bilateral supraclavicular nodal involvement. She scheduled to begin  Treanda plus rituximab. I've asked to to evaluate her for possible palliative radiation therapy to her right axilla. She is otherwise doing well. She specifically denies fever chills night sweats. She has had hyperglycemia which has resolved.  PLANNED TREATMENT REGIMEN: Palliative radiation to right axilla  PAST MEDICAL HISTORY:  has a past medical history of Arthritis; Cataract; Chicken pox; Colon polyp; Follicular lymphoma ([place]) (08/2016); Hyperlipidemia; and Osteoporosis.    PAST SURGICAL HISTORY:  Past Surgical History:  Procedure Laterality Date  . AXILLARY LYMPH NODE DISSECTION Right 08/21/2016   Procedure: AXILLARY LYMPH NODE excision;  Surgeon: Leonie Green, MD;  Location: [facility] ORS;  Service: General;  Laterality: Right;  . CATARACT EXTRACTION, BILATERAL Bilateral   . JOINT  REPLACEMENT Left   . TONSILLECTOMY    . TOTAL HIP ARTHROPLASTY Left 1992    FAMILY HISTORY: family history includes Basal cell carcinoma in her daughter; Breast cancer in her paternal aunt; Diabetes in her brother and sister; Lung cancer in her brother.  SOCIAL HISTORY:  reports that she has never smoked. She has never used smokeless tobacco. She reports that she does not drink alcohol or use drugs.  ALLERGIES: Patient has no known allergies.  MEDICATIONS:  Current Outpatient Prescriptions  Medication Sig Dispense Refill  . alendronate (FOSAMAX) 70 MG tablet Take 70 mg by mouth once a week. Take with a full glass of water on an empty stomach.    . Cholecalciferol (VITAMIN D-3) 1000 units CAPS Take 1,000 Units by mouth daily.    . meloxicam (MOBIC) 15 MG tablet Take 1 tablet by mouth daily.    . Multiple Vitamins-Minerals (CENTRUM SILVER PO) Take 1 tablet by mouth daily.    . ondansetron (ZOFRAN-ODT) 8 MG disintegrating tablet Take 1 tablet (8 mg total) by mouth every 8 (eight) hours as needed for nausea or vomiting. 30 tablet 2  . pantoprazole (PROTONIX) 20 MG tablet Take 1 tablet by mouth daily.    . simvastatin (ZOCOR) 20 MG tablet Take 1 tablet by mouth at bedtime.     Marland Kitchen tiZANidine (ZANAFLEX) 2 MG tablet Take 1 tablet by mouth at bedtime as needed.     . traMADol-acetaminophen (ULTRACET) 37.5-325 MG tablet Take 1 tablet by mouth every 8 (eight) hours as needed.      No current facility-administered medications for this encounter.     ECOG PERFORMANCE STATUS:  1 - Symptomatic but completely ambulatory  REVIEW  OF SYSTEMS:  Patient denies any weight loss, fatigue, weakness, fever, chills or night sweats. Patient denies any loss of vision, blurred vision. Patient denies any ringing  of the ears or hearing loss. No irregular heartbeat. Patient denies heart murmur or history of fainting. Patient denies any chest pain or pain radiating to her upper extremities. Patient denies any shortness  of breath, difficulty breathing at night, cough or hemoptysis. Patient denies any swelling in the lower legs. Patient denies any nausea vomiting, vomiting of blood, or coffee ground material in the vomitus. Patient denies any stomach pain. Patient states has had normal bowel movements no significant constipation or diarrhea. Patient denies any dysuria, hematuria or significant nocturia. Patient denies any problems walking, swelling in the joints or loss of balance. Patient denies any skin changes, loss of hair or loss of weight. Patient denies any excessive worrying or anxiety or significant depression. Patient denies any problems with insomnia. Patient denies excessive thirst, polyuria, polydipsia. Patient denies any swollen glands, patient denies easy bruising or easy bleeding. Patient denies any recent infections, allergies or URI. Patient "s visual fields have not changed significantly in recent time.    PHYSICAL EXAM: BP 135/82   Pulse 93   Temp 98 F (36.7 C)   Wt 120 lb 4.2 oz (54.5 kg)   BMI 24.29 kg/m  Patient does have bulky adenopathy in her right axilla and right supraclavicular fossa. Some smaller nodes are noted in her left supraclavicular fossa. Well-developed well-nourished patient in NAD. HEENT reveals PERLA, EOMI, discs not visualized.  Oral cavity is clear. No oral mucosal lesions are identified. Neck is clear without evidence of cervical or supraclavicular adenopathy. Lungs are clear to A&P. Cardiac examination is essentially unremarkable with regular rate and rhythm without murmur rub or thrill. Abdomen is benign with no organomegaly or masses noted. Motor sensory and DTR levels are equal and symmetric in the upper and lower extremities. Cranial nerves II through XII are grossly intact. Proprioception is intact. No peripheral adenopathy or edema is identified. No motor or sensory levels are noted. Crude visual fields are within normal range.  LABORATORY DATA: Pathology reports  reviewed    RADIOLOGY RESULTS: PET scan reviewed and compatible with the above-stated findings   IMPRESSION: Progressive B cell lymphoma follicular type with bulky disease in right axilla and right supraclavicular fossa for palliative radiation therapy.  PLAN: At this time I have recommended going ahead with the palliative course of radiation therapy. Would plan on delivering 3600 cGy in 18 fractions. I would use PET/CT fusion study to target those with no change in her right axilla as well as right supra clavicular fossa. Risks and benefits of treatment including skin reaction fatigue possible alteration of blood counts all were discussed in detail with the patient and her daughter. I have personally set up and ordered CT simulation for later this week. At some point patient may be a candidate for Zevalin infusion should her other chemotherapy regimens failed to stop progression of her disease. Patient and daughter both comp and my treatment plan well.  I would like to take this opportunity to thank you for allowing me to participate in the care of your patient.Armstead Peaks., MD

## 2017-04-30 NOTE — Progress Notes (Signed)
[place]  [phone]) [phone] Fax:(336) [phone]  ID: [name] OB: 03-01-1946  MR#: [id number]  JQB#:[id number]  Patient Care Team: Glendon Axe, MD as PCP - General (Internal Medicine) Leonie Green, MD as Referring Physician (Surgery)  CHIEF COMPLAINT: Recurrent follicular lymphoma  INTERVAL HISTORY: Patient returns to clinic today for further evaluation, discussion of her imaging results, and treatment planning. She continues to have right axillary pain, but otherwise feels well and is asymptomatic. She has no neurologic complaints. She denies any fevers, night sweats, or weight loss. She has no chest pain or shortness of breath. She denies any nausea, vomiting, constipation, or diarrhea. She has no urinary complaint. Patient offers no further specific complaints today.  REVIEW OF SYSTEMS:   Review of Systems  Constitutional: Negative.  Negative for fever, malaise/fatigue and weight loss.  Respiratory: Negative.  Negative for cough and shortness of breath.   Cardiovascular: Negative.  Negative for chest pain and leg swelling.  Gastrointestinal: Negative.  Negative for abdominal pain.  Genitourinary: Negative.   Musculoskeletal:       Right arm pain  Neurological: Negative.  Negative for sensory change and weakness.  Psychiatric/Behavioral: Negative.  The patient is not nervous/anxious.    As per HPI. Otherwise, a complete review of systems is negative.   PAST MEDICAL HISTORY: Past Medical History:  Diagnosis Date  . Arthritis   . Cataract    bilateral  . Chicken pox   . Colon polyp   . Follicular lymphoma ([place]) 08/2016   lymph nodes   . Hyperlipidemia   . Osteoporosis     PAST SURGICAL HISTORY: Past Surgical History:  Procedure Laterality Date  . AXILLARY LYMPH NODE DISSECTION Right 08/21/2016   Procedure: AXILLARY LYMPH NODE excision;  Surgeon: Leonie Green, MD;  Location: [facility] ORS;  Service: General;  Laterality:  Right;  . CATARACT EXTRACTION, BILATERAL Bilateral   . JOINT REPLACEMENT Left   . TONSILLECTOMY    . TOTAL HIP ARTHROPLASTY Left 1992    FAMILY HISTORY: Family History  Problem Relation Age of Onset  . Diabetes Sister   . Lung cancer Brother   . Diabetes Brother   . Basal cell carcinoma Daughter   . Breast cancer Paternal Aunt     ADVANCED DIRECTIVES (Y/N):  N  HEALTH MAINTENANCE: Social History  Substance Use Topics  . Smoking status: Never Smoker  . Smokeless tobacco: Never Used  . Alcohol use No     Colonoscopy:  PAP:  Bone density:  Lipid panel:  No Known Allergies  Current Outpatient Prescriptions  Medication Sig Dispense Refill  . alendronate (FOSAMAX) 70 MG tablet Take 70 mg by mouth once a week. Take with a full glass of water on an empty stomach.    . Cholecalciferol (VITAMIN D-3) 1000 units CAPS Take 1,000 Units by mouth daily.    . meloxicam (MOBIC) 15 MG tablet Take 1 tablet by mouth daily.    . Multiple Vitamins-Minerals (CENTRUM SILVER PO) Take 1 tablet by mouth daily.    . ondansetron (ZOFRAN-ODT) 8 MG disintegrating tablet Take 1 tablet (8 mg total) by mouth every 8 (eight) hours as needed for nausea or vomiting. 30 tablet 2  . pantoprazole (PROTONIX) 20 MG tablet Take 1 tablet by mouth daily.    . simvastatin (ZOCOR) 20 MG tablet Take 1 tablet by mouth at bedtime.     Marland Kitchen tiZANidine (ZANAFLEX) 2 MG tablet Take 1 tablet by mouth at bedtime as needed.     Marland Kitchen  traMADol-acetaminophen (ULTRACET) 37.5-325 MG tablet Take 1 tablet by mouth every 8 (eight) hours as needed.      No current facility-administered medications for this visit.     OBJECTIVE: There were no vitals filed for this visit.   There is no height or weight on file to calculate BMI.    ECOG FS:0 - Asymptomatic  General: Well-developed, well-nourished, no acute distress. Eyes: Pink conjunctiva, anicteric sclera. Lungs: Clear to auscultation bilaterally. Heart: Regular rate and rhythm. No  rubs, murmurs, or gallops. Abdomen: Soft, nontender, nondistended. No organomegaly noted, normoactive bowel sounds. Musculoskeletal: No edema, cyanosis, or clubbing. Neuro: Alert, answering all questions appropriately. Cranial nerves grossly intact. Skin: No rashes or petechiae noted. Psych: Normal affect. Lymphatics: Easily palpable lymphadenopathy in right axilla. Fullness noted surrounding right clavicle.  LAB RESULTS:  Lab Results  Component Value Date   NA 141 02/28/2017   K 4.2 02/28/2017   CL 106 02/28/2017   CO2 29 02/28/2017   GLUCOSE 93 02/28/2017   BUN 15 02/28/2017   CREATININE 0.83 02/28/2017   CALCIUM 9.6 02/28/2017   PROT 7.5 02/28/2017   ALBUMIN 4.6 02/28/2017   AST 29 02/28/2017   ALT 20 02/28/2017   ALKPHOS 68 02/28/2017   BILITOT 0.7 02/28/2017   GFRNONAA >60 02/28/2017   GFRAA >60 02/28/2017    Lab Results  Component Value Date   WBC 4.1 02/28/2017   NEUTROABS 1.6 02/28/2017   HGB 15.1 02/28/2017   HCT 44.0 02/28/2017   MCV 91.5 02/28/2017   PLT 228 02/28/2017     STUDIES: Nm Pet Image Restag (ps) Skull Base To Thigh  Result Date: 04/19/2017 CLINICAL DATA:  Subsequent treatment strategy for follicular lymphoma diagnosed October 2017 status post Rituxan. EXAM: NUCLEAR MEDICINE PET SKULL BASE TO THIGH TECHNIQUE: 12.6 mCi F-18 FDG was injected intravenously. Full-ring PET imaging was performed from the skull base to thigh after the radiotracer. CT data was obtained and used for attenuation correction and anatomic localization. FASTING BLOOD GLUCOSE:  Value: 105 mg/dl COMPARISON:  02/12/2017 CT chest, abdomen and pelvis. 11/07/2016 PET-CT. FINDINGS: NECK Newly enlarged hypermetabolic 1.7 cm left supraclavicular neck node with max SUV 19.2 (series 3/ image 63). No additional enlarged or hypermetabolic neck lymph nodes. CHEST Multiple new bulky hypermetabolic right axillary lymph nodes, largest 4.3 cm with max SUV 28.1 (series 3/ image 77). Hypermetabolic  enlarged 1.5 cm right retropectoral node with max SUV 14.7 (series 3/image 74), previously 1.1 cm with max SUV 14.7 on 11/07/2016, mildly increased in size and stable in metabolism. New hypermetabolic left retropectoral adenopathy, for example an enlarged 1.2 cm left retropectoral node with max SUV 11.2 (series 3/image 74). New mildly hypermetabolic left axillary adenopathy, for example a mildly enlarged 1.0 cm left axillary node with max SUV 3.6 (series 3/image 73). No enlarged or hypermetabolic mediastinal or hilar nodes. Atherosclerotic nonaneurysmal thoracic aorta. Left anterior descending coronary atherosclerosis. No pneumothorax. No pleural effusions. No acute consolidative airspace disease, lung masses or significant pulmonary nodules. ABDOMEN/PELVIS No abnormal hypermetabolic activity within the liver, pancreas, adrenal glands, or spleen. No hypermetabolic lymph nodes in the abdomen or pelvis. Atherosclerotic nonaneurysmal abdominal aorta. Mild scattered colonic diverticulosis. SKELETON No focal hypermetabolic activity to suggest skeletal metastasis. Status post left total hip arthroplasty. Mildly hypermetabolic (max SUV 4.4) lytic 3.2 x 2.5 cm superomedial left acetabular lesion (series 3/image 209), previously 3.3 x 2.6 cm with max SUV 4.4 on 11/07/2016, stable in size and metabolism. IMPRESSION: 1. Significant metabolic and size progression of bulky  lymphoma involving the right greater than left axillary, bilateral retropectoral and left supraclavicular nodal chains. 2. No metabolic evidence of abdominopelvic or skeletal lymphoma . 3. 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 Electronically Signed   By: Ilona Sorrel M.D.   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  PLAN:    1. Recurrent follicular lymphoma: PET scan  results from April 20, 2017 reviewed independently and reported as above with significant progression of bulky lymphadenopathy in right and left axilla as well as bilateral supraclavicular nodal chains. Given the extent of disease XRT alone will not suffice, but patient may benefit from palliative XRT to her painful right axillary node. Patient received 4 cycles of single agent rituximab completing on October 02, 2016. After lengthy discussion with the patient, she wishes to proceed with treatment using Zevalin. Patient will receive 250 mg/m of Rituxan on day 1 and then a second infusion on day 7, 8, or 9. Patient will then receive her Zevalin dosing 4 hours after her second infusion of Rituxan. Will have to monitor for severe and prolonged cytopenias as well as mucocutaneous reactions.  Patient will return to clinic in the next several weeks to coordinate treatment.  2. Hyperglycemia: Resolved.  Approximately 30 minutes was spent in discussion of which greater than 50% was consultation.   Patient expressed understanding and was in agreement with this plan. She also understands that She can call clinic at any time with any questions, concerns, or complaints.   Cancer Staging Follicular lymphoma ([place]) Staging form: Lymphoid Neoplasms, AJCC 6th Edition - Clinical stage from 08/27/2016: Stage IIE (lymphoma only) - Signed by Lloyd Huger, MD on 08/27/2016   Lloyd Huger, MD   05/04/2017 8:30 AM

## 2017-05-02 ENCOUNTER — Ambulatory Visit
Admission: RE | Admit: 2017-05-02 | Discharge: 2017-05-02 | Disposition: A | Discharge status: Home / Self Care | Payer: Medicare Other | Source: Ambulatory Visit | Attending: Radiation Oncology | Admitting: Radiation Oncology

## 2017-05-02 DIAGNOSIS — C8218 Follicular lymphoma grade II, lymph nodes of multiple sites: Secondary | ICD-10-CM | POA: Diagnosis not present

## 2017-05-09 DIAGNOSIS — C8218 Follicular lymphoma grade II, lymph nodes of multiple sites: Secondary | ICD-10-CM | POA: Diagnosis not present

## 2017-05-10 ENCOUNTER — Other Ambulatory Visit: Payer: Self-pay | Admitting: *Deleted

## 2017-05-10 DIAGNOSIS — C8204 Follicular lymphoma grade I, lymph nodes of axilla and upper limb: Secondary | ICD-10-CM

## 2017-05-13 ENCOUNTER — Ambulatory Visit
Admission: RE | Admit: 2017-05-13 | Discharge: 2017-05-13 | Disposition: A | Discharge status: Home / Self Care | Payer: Medicare Other | Source: Ambulatory Visit | Attending: Radiation Oncology | Admitting: Radiation Oncology

## 2017-05-13 DIAGNOSIS — C8218 Follicular lymphoma grade II, lymph nodes of multiple sites: Secondary | ICD-10-CM | POA: Diagnosis not present

## 2017-05-14 ENCOUNTER — Ambulatory Visit
Admission: RE | Admit: 2017-05-14 | Discharge: 2017-05-14 | Disposition: A | Discharge status: Home / Self Care | Payer: Medicare Other | Source: Ambulatory Visit | Attending: Radiation Oncology | Admitting: Radiation Oncology

## 2017-05-14 DIAGNOSIS — C8218 Follicular lymphoma grade II, lymph nodes of multiple sites: Secondary | ICD-10-CM | POA: Diagnosis not present

## 2017-05-15 ENCOUNTER — Ambulatory Visit
Admission: RE | Admit: 2017-05-15 | Discharge: 2017-05-15 | Disposition: A | Discharge status: Home / Self Care | Payer: Medicare Other | Source: Ambulatory Visit | Attending: Radiation Oncology | Admitting: Radiation Oncology

## 2017-05-15 DIAGNOSIS — C8218 Follicular lymphoma grade II, lymph nodes of multiple sites: Secondary | ICD-10-CM | POA: Diagnosis not present

## 2017-05-16 ENCOUNTER — Ambulatory Visit
Admission: RE | Admit: 2017-05-16 | Discharge: 2017-05-16 | Disposition: A | Discharge status: Home / Self Care | Payer: Medicare Other | Source: Ambulatory Visit | Attending: Radiation Oncology | Admitting: Radiation Oncology

## 2017-05-16 DIAGNOSIS — C8218 Follicular lymphoma grade II, lymph nodes of multiple sites: Secondary | ICD-10-CM | POA: Diagnosis not present

## 2017-05-17 ENCOUNTER — Ambulatory Visit
Admission: RE | Admit: 2017-05-17 | Discharge: 2017-05-17 | Disposition: A | Discharge status: Home / Self Care | Payer: Medicare Other | Source: Ambulatory Visit | Attending: Radiation Oncology | Admitting: Radiation Oncology

## 2017-05-17 DIAGNOSIS — C8218 Follicular lymphoma grade II, lymph nodes of multiple sites: Secondary | ICD-10-CM | POA: Diagnosis not present

## 2017-05-20 ENCOUNTER — Ambulatory Visit
Admission: RE | Admit: 2017-05-20 | Discharge: 2017-05-20 | Disposition: A | Discharge status: Home / Self Care | Payer: Medicare Other | Source: Ambulatory Visit | Attending: Radiation Oncology | Admitting: Radiation Oncology

## 2017-05-20 DIAGNOSIS — C8218 Follicular lymphoma grade II, lymph nodes of multiple sites: Secondary | ICD-10-CM | POA: Diagnosis not present

## 2017-05-21 ENCOUNTER — Ambulatory Visit
Admission: RE | Admit: 2017-05-21 | Discharge: 2017-05-21 | Disposition: A | Discharge status: Home / Self Care | Payer: Medicare Other | Source: Ambulatory Visit | Attending: Radiation Oncology | Admitting: Radiation Oncology

## 2017-05-21 DIAGNOSIS — C8218 Follicular lymphoma grade II, lymph nodes of multiple sites: Secondary | ICD-10-CM | POA: Diagnosis not present

## 2017-05-22 ENCOUNTER — Ambulatory Visit
Admission: RE | Admit: 2017-05-22 | Discharge: 2017-05-22 | Disposition: A | Discharge status: Home / Self Care | Payer: Medicare Other | Source: Ambulatory Visit | Attending: Radiation Oncology | Admitting: Radiation Oncology

## 2017-05-22 DIAGNOSIS — C8218 Follicular lymphoma grade II, lymph nodes of multiple sites: Secondary | ICD-10-CM | POA: Diagnosis not present

## 2017-05-23 ENCOUNTER — Ambulatory Visit
Admission: RE | Admit: 2017-05-23 | Discharge: 2017-05-23 | Disposition: A | Discharge status: Home / Self Care | Payer: Medicare Other | Source: Ambulatory Visit | Attending: Radiation Oncology | Admitting: Radiation Oncology

## 2017-05-23 ENCOUNTER — Inpatient Hospital Stay: Payer: Medicare Other | Attending: Radiation Oncology

## 2017-05-23 DIAGNOSIS — C8218 Follicular lymphoma grade II, lymph nodes of multiple sites: Secondary | ICD-10-CM | POA: Diagnosis not present

## 2017-05-23 DIAGNOSIS — C8204 Follicular lymphoma grade I, lymph nodes of axilla and upper limb: Secondary | ICD-10-CM

## 2017-05-23 LAB — CBC
HCT: 40.3 % (ref 35.0–47.0)
Hemoglobin: 14 g/dL (ref 12.0–16.0)
MCH: 31.6 pg (ref 26.0–34.0)
MCHC: 34.8 g/dL (ref 32.0–36.0)
MCV: 90.7 fL (ref 80.0–100.0)
PLATELETS: 282 10*3/uL (ref 150–440)
RBC: 4.44 MIL/uL (ref 3.80–5.20)
RDW: 12.4 % (ref 11.5–14.5)
WBC: 3.6 10*3/uL (ref 3.6–11.0)

## 2017-05-24 ENCOUNTER — Ambulatory Visit
Admission: RE | Admit: 2017-05-24 | Discharge: 2017-05-24 | Disposition: A | Discharge status: Home / Self Care | Payer: Medicare Other | Source: Ambulatory Visit | Attending: Radiation Oncology | Admitting: Radiation Oncology

## 2017-05-24 DIAGNOSIS — C8218 Follicular lymphoma grade II, lymph nodes of multiple sites: Secondary | ICD-10-CM | POA: Diagnosis not present

## 2017-05-27 ENCOUNTER — Ambulatory Visit
Admission: RE | Admit: 2017-05-27 | Discharge: 2017-05-27 | Disposition: A | Discharge status: Home / Self Care | Payer: Medicare Other | Source: Ambulatory Visit | Attending: Radiation Oncology | Admitting: Radiation Oncology

## 2017-05-27 DIAGNOSIS — C8218 Follicular lymphoma grade II, lymph nodes of multiple sites: Secondary | ICD-10-CM | POA: Diagnosis not present

## 2017-05-28 ENCOUNTER — Ambulatory Visit
Admission: RE | Admit: 2017-05-28 | Discharge: 2017-05-28 | Disposition: A | Discharge status: Home / Self Care | Payer: Medicare Other | Source: Ambulatory Visit | Attending: Radiation Oncology | Admitting: Radiation Oncology

## 2017-05-28 DIAGNOSIS — C8218 Follicular lymphoma grade II, lymph nodes of multiple sites: Secondary | ICD-10-CM | POA: Diagnosis not present

## 2017-05-29 ENCOUNTER — Ambulatory Visit
Admission: RE | Admit: 2017-05-29 | Discharge: 2017-05-29 | Disposition: A | Discharge status: Home / Self Care | Payer: Medicare Other | Source: Ambulatory Visit | Attending: Radiation Oncology | Admitting: Radiation Oncology

## 2017-05-29 DIAGNOSIS — C8218 Follicular lymphoma grade II, lymph nodes of multiple sites: Secondary | ICD-10-CM | POA: Diagnosis not present

## 2017-05-30 ENCOUNTER — Ambulatory Visit
Admission: RE | Admit: 2017-05-30 | Discharge: 2017-05-30 | Disposition: A | Discharge status: Home / Self Care | Payer: Medicare Other | Source: Ambulatory Visit | Attending: Radiation Oncology | Admitting: Radiation Oncology

## 2017-05-30 ENCOUNTER — Inpatient Hospital Stay: Payer: Medicare Other

## 2017-05-30 DIAGNOSIS — C8218 Follicular lymphoma grade II, lymph nodes of multiple sites: Secondary | ICD-10-CM | POA: Diagnosis not present

## 2017-05-30 DIAGNOSIS — C8204 Follicular lymphoma grade I, lymph nodes of axilla and upper limb: Secondary | ICD-10-CM

## 2017-05-30 LAB — CBC
HCT: 38.7 % (ref 35.0–47.0)
Hemoglobin: 13.5 g/dL (ref 12.0–16.0)
MCH: 31.7 pg (ref 26.0–34.0)
MCHC: 34.8 g/dL (ref 32.0–36.0)
MCV: 91 fL (ref 80.0–100.0)
PLATELETS: 217 10*3/uL (ref 150–440)
RBC: 4.26 MIL/uL (ref 3.80–5.20)
RDW: 12.4 % (ref 11.5–14.5)
WBC: 3.5 10*3/uL — ABNORMAL LOW (ref 3.6–11.0)

## 2017-05-31 ENCOUNTER — Ambulatory Visit
Admission: RE | Admit: 2017-05-31 | Discharge: 2017-05-31 | Disposition: A | Discharge status: Home / Self Care | Payer: Medicare Other | Source: Ambulatory Visit | Attending: Radiation Oncology | Admitting: Radiation Oncology

## 2017-05-31 DIAGNOSIS — C8218 Follicular lymphoma grade II, lymph nodes of multiple sites: Secondary | ICD-10-CM | POA: Diagnosis not present

## 2017-06-03 ENCOUNTER — Ambulatory Visit
Admission: RE | Admit: 2017-06-03 | Discharge: 2017-06-03 | Disposition: A | Discharge status: Home / Self Care | Payer: Medicare Other | Source: Ambulatory Visit | Attending: Radiation Oncology | Admitting: Radiation Oncology

## 2017-06-03 DIAGNOSIS — C8218 Follicular lymphoma grade II, lymph nodes of multiple sites: Secondary | ICD-10-CM | POA: Diagnosis not present

## 2017-06-04 ENCOUNTER — Ambulatory Visit
Admission: RE | Admit: 2017-06-04 | Discharge: 2017-06-04 | Disposition: A | Discharge status: Home / Self Care | Payer: Medicare Other | Source: Ambulatory Visit | Attending: Radiation Oncology | Admitting: Radiation Oncology

## 2017-06-04 DIAGNOSIS — C8218 Follicular lymphoma grade II, lymph nodes of multiple sites: Secondary | ICD-10-CM | POA: Diagnosis not present

## 2017-06-05 ENCOUNTER — Ambulatory Visit
Admission: RE | Admit: 2017-06-05 | Discharge: 2017-06-05 | Disposition: A | Discharge status: Home / Self Care | Payer: Medicare Other | Source: Ambulatory Visit | Attending: Radiation Oncology | Admitting: Radiation Oncology

## 2017-06-05 DIAGNOSIS — C8218 Follicular lymphoma grade II, lymph nodes of multiple sites: Secondary | ICD-10-CM | POA: Diagnosis not present

## 2017-06-06 ENCOUNTER — Ambulatory Visit
Admission: RE | Admit: 2017-06-06 | Discharge: 2017-06-06 | Disposition: A | Discharge status: Home / Self Care | Payer: Medicare Other | Source: Ambulatory Visit | Attending: Radiation Oncology | Admitting: Radiation Oncology

## 2017-06-06 DIAGNOSIS — C8218 Follicular lymphoma grade II, lymph nodes of multiple sites: Secondary | ICD-10-CM | POA: Diagnosis not present

## 2017-06-11 ENCOUNTER — Ambulatory Visit
Admission: RE | Admit: 2017-06-11 | Discharge: 2017-06-11 | Disposition: A | Discharge status: Home / Self Care | Payer: Medicare Other | Source: Ambulatory Visit | Attending: Oncology | Admitting: Oncology

## 2017-06-11 DIAGNOSIS — C8298 Follicular lymphoma, unspecified, lymph nodes of multiple sites: Secondary | ICD-10-CM | POA: Insufficient documentation

## 2017-06-11 DIAGNOSIS — R590 Localized enlarged lymph nodes: Secondary | ICD-10-CM | POA: Diagnosis not present

## 2017-06-11 DIAGNOSIS — M899 Disorder of bone, unspecified: Secondary | ICD-10-CM | POA: Diagnosis not present

## 2017-06-11 LAB — POCT I-STAT CREATININE: CREATININE: 0.8 mg/dL (ref 0.44–1.00)

## 2017-06-11 MED ORDER — IOPAMIDOL (ISOVUE-300) INJECTION 61%
85.0000 mL | Freq: Once | INTRAVENOUS | Status: AC | PRN
  Administered 2017-06-11: 85 mL via INTRAVENOUS

## 2017-06-12 NOTE — Progress Notes (Signed)
[place]  Telephone:(336) [phone] Fax:(336) [phone]  ID: Veronica Boyd OB: 04-Feb-1946  MR#: [id number]  ESP#:[id number]  Patient Care Team: Glendon Axe, MD as PCP - General (Internal Medicine) Leonie Green, MD as Referring Physician (Surgery)  CHIEF COMPLAINT: Recurrent follicular lymphoma  INTERVAL HISTORY: Patient returns to clinic today for further evaluation, discussion of her imaging results, and treatment planning. She continues to have right axillary swelling, but her pain has significantly improved after completing palliative XRT. She has new increased lymphadenopathy on her left neck. She has no neurologic complaints. She denies any fevers, night sweats, or weight loss. She has no chest pain or shortness of breath. She denies any nausea, vomiting, constipation, or diarrhea. She has no urinary complaint. Patient offers no further specific complaints today.  REVIEW OF SYSTEMS:   Review of Systems  Constitutional: Negative.  Negative for fever, malaise/fatigue and weight loss.  Respiratory: Negative.  Negative for cough and shortness of breath.   Cardiovascular: Negative.  Negative for chest pain and leg swelling.  Gastrointestinal: Negative.  Negative for abdominal pain.  Genitourinary: Negative.   Musculoskeletal: Negative.   Skin: Negative.  Negative for rash.  Neurological: Negative.  Negative for sensory change and weakness.  Psychiatric/Behavioral: Negative.  The patient is not nervous/anxious.    As per HPI. Otherwise, a complete review of systems is negative.   PAST MEDICAL HISTORY: Past Medical History:  Diagnosis Date  . Arthritis   . Cataract    bilateral  . Chicken pox   . Colon polyp   . Follicular lymphoma ([place]) 08/2016   lymph nodes   . Hyperlipidemia   . Osteoporosis     PAST SURGICAL HISTORY: Past Surgical History:  Procedure Laterality Date  . AXILLARY LYMPH NODE DISSECTION Right 08/21/2016   Procedure:  AXILLARY LYMPH NODE excision;  Surgeon: Leonie Green, MD;  Location: [facility] ORS;  Service: General;  Laterality: Right;  . CATARACT EXTRACTION, BILATERAL Bilateral   . JOINT REPLACEMENT Left   . TONSILLECTOMY    . TOTAL HIP ARTHROPLASTY Left 1992    FAMILY HISTORY: Family History  Problem Relation Age of Onset  . Diabetes Sister   . Lung cancer Brother   . Diabetes Brother   . Basal cell carcinoma Daughter   . Breast cancer Paternal Aunt     ADVANCED DIRECTIVES (Y/N):  N  HEALTH MAINTENANCE: Social History  Substance Use Topics  . Smoking status: Never Smoker  . Smokeless tobacco: Never Used  . Alcohol use No     Colonoscopy:  PAP:  Bone density:  Lipid panel:  No Known Allergies  Current Outpatient Prescriptions  Medication Sig Dispense Refill  . alendronate (FOSAMAX) 70 MG tablet Take 70 mg by mouth once a week. Take with a full glass of water on an empty stomach.    . Cholecalciferol (VITAMIN D-3) 1000 units CAPS Take 1,000 Units by mouth daily.    . meloxicam (MOBIC) 15 MG tablet Take 1 tablet by mouth daily.    . Multiple Vitamins-Minerals (CENTRUM SILVER PO) Take 1 tablet by mouth daily.    . ondansetron (ZOFRAN-ODT) 8 MG disintegrating tablet Take 1 tablet (8 mg total) by mouth every 8 (eight) hours as needed for nausea or vomiting. 30 tablet 2  . pantoprazole (PROTONIX) 20 MG tablet Take 1 tablet by mouth daily.    . simvastatin (ZOCOR) 20 MG tablet Take 1 tablet by mouth at bedtime.     Marland Kitchen tiZANidine (ZANAFLEX) 2 MG  tablet Take 1 tablet by mouth at bedtime as needed.     . traMADol-acetaminophen (ULTRACET) 37.5-325 MG tablet Take 1 tablet by mouth every 8 (eight) hours as needed.      No current facility-administered medications for this visit.     OBJECTIVE: Vitals:   06/13/17 1029  BP: 112/64  Pulse: 94  Resp: 18  Temp: 97.7 F (36.5 C)     Body mass index is 24.62 kg/m.    ECOG FS:0 - Asymptomatic  General: Well-developed, well-nourished, no  acute distress. Eyes: Pink conjunctiva, anicteric sclera. Lungs: Clear to auscultation bilaterally. Heart: Regular rate and rhythm. No rubs, murmurs, or gallops. Abdomen: Soft, nontender, nondistended. No organomegaly noted, normoactive bowel sounds. Musculoskeletal: No edema, cyanosis, or clubbing. Neuro: Alert, answering all questions appropriately. Cranial nerves grossly intact. Skin: No rashes or petechiae noted. Psych: Normal affect. Lymphatics: Easily palpable lymphadenopathy in right axilla. Fullness noted surrounding right clavicle. New increasing lymphadenopathy in left cervical chain.  LAB RESULTS:  Lab Results  Component Value Date   NA 141 02/28/2017   K 4.2 02/28/2017   CL 106 02/28/2017   CO2 29 02/28/2017   GLUCOSE 93 02/28/2017   BUN 15 02/28/2017   CREATININE 0.80 06/11/2017   CALCIUM 9.6 02/28/2017   PROT 7.5 02/28/2017   ALBUMIN 4.6 02/28/2017   AST 29 02/28/2017   ALT 20 02/28/2017   ALKPHOS 68 02/28/2017   BILITOT 0.7 02/28/2017   GFRNONAA >60 02/28/2017   GFRAA >60 02/28/2017    Lab Results  Component Value Date   WBC 3.5 (L) 05/30/2017   NEUTROABS 1.6 02/28/2017   HGB 13.5 05/30/2017   HCT 38.7 05/30/2017   MCV 91.0 05/30/2017   PLT 217 05/30/2017     STUDIES: Ct Chest W Contrast  Result Date: 06/11/2017 CLINICAL DATA:  Restaging lymphoma. EXAM: CT CHEST, ABDOMEN, AND PELVIS WITH CONTRAST TECHNIQUE: Multidetector CT imaging of the chest, abdomen and pelvis was performed following the standard protocol during bolus administration of intravenous contrast. CONTRAST:  71m ISOVUE-300 IOPAMIDOL (ISOVUE-300) INJECTION 61% COMPARISON:  PET-CT from 03/19/2017. FINDINGS: CT CHEST FINDINGS Cardiovascular: The heart size is normal. No pericardial effusion. Aortic atherosclerosis is identified. Calcification in the LAD coronary artery is identified. Mediastinum/Nodes: The trachea appears patent and is midline. Normal appearance of the esophagus. There is been  interval progression of thoracic adenopathy. Index right supra scratch set index left supraclavicular lymph node measures 3.6 cm, image 5 of series 2. Previously 1.7 cm. Large necrotic right axillary node measures 4.5 cm, image 17 of series 2. Previously 4.3 cm. Index right supraclavicular lymph node measures 1 cm, image 11 of series 2. Previously 1.5 cm. Index right internal mammary lymph node is new measuring 0.9 cm, image 23 of series 2. Distal right internal mammary lymph node is also new measuring 1.8 cm, image 40 of series 2. Left retropectoral node measures 2.4 cm, image 12 of series 2. Previously 1.2 cm. Lungs/Pleura: There is no pleural effusion. No airspace consolidation or atelectasis. 6 mm subpleural nodule is identified within the right lung base. Previously this measured the same, image 104 of series 4. Musculoskeletal: No aggressive lytic or sclerotic bone lesions identified. CT ABDOMEN PELVIS FINDINGS Hepatobiliary: There is no focal liver abnormality. The gallbladder appears normal. No biliary dilatation. Pancreas: Unremarkable. No pancreatic ductal dilatation or surrounding inflammatory changes. Spleen: The spleen is normal. Adrenals/Urinary Tract: The adrenal glands are unremarkable. Normal appearance of both kidneys. The urinary bladder is normal. Stomach/Bowel: The stomach is  normal. The small bowel loops have a normal course and caliber. No obstruction. No pathologic dilatation of the colon. Vascular/Lymphatic: Aortic atherosclerosis. No aneurysm. There is no upper abdominal adenopathy. No pelvic or inguinal adenopathy identified. Reproductive: The uterus and adnexal structures are unremarkable. Other: No free fluid or fluid collections. Musculoskeletal: Lytic lesion involving the left acetabulum is again identified. Tumor measures 4.3 x 2.1 cm, image 96 of series 2. 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 IMPRESSION: 1. There is been interval progression of  thoracic adenopathy. 2. Lytic lesion involving the left acetabulum is similar in size to previous exam. 3. No evidence for adenopathy within the abdomen or pelvis. Electronically Signed   By: Kerby Moors M.D.   On: 06/11/2017 12:36   Ct Abdomen Pelvis W Contrast  Result Date: 06/11/2017 CLINICAL DATA:  Restaging lymphoma. EXAM: CT CHEST, ABDOMEN, AND PELVIS WITH CONTRAST TECHNIQUE: Multidetector CT imaging of the chest, abdomen and pelvis was performed following the standard protocol during bolus administration of intravenous contrast. CONTRAST:  1m ISOVUE-300 IOPAMIDOL (ISOVUE-300) INJECTION 61% COMPARISON:  PET-CT from 03/19/2017. FINDINGS: CT CHEST FINDINGS Cardiovascular: The heart size is normal. No pericardial effusion. Aortic atherosclerosis is identified. Calcification in the LAD coronary artery is identified. Mediastinum/Nodes: The trachea appears patent and is midline. Normal appearance of the esophagus. There is been interval progression of thoracic adenopathy. Index right supra scratch set index left supraclavicular lymph node measures 3.6 cm, image 5 of series 2. Previously 1.7 cm. Large necrotic right axillary node measures 4.5 cm, image 17 of series 2. Previously 4.3 cm. Index right supraclavicular lymph node measures 1 cm, image 11 of series 2. Previously 1.5 cm. Index right internal mammary lymph node is new measuring 0.9 cm, image 23 of series 2. Distal right internal mammary lymph node is also new measuring 1.8 cm, image 40 of series 2. Left retropectoral node measures 2.4 cm, image 12 of series 2. Previously 1.2 cm. Lungs/Pleura: There is no pleural effusion. No airspace consolidation or atelectasis. 6 mm subpleural nodule is identified within the right lung base. Previously this measured the same, image 104 of series 4. Musculoskeletal: No aggressive lytic or sclerotic bone lesions identified. CT ABDOMEN PELVIS FINDINGS Hepatobiliary: There is no focal liver abnormality. The gallbladder  appears normal. No biliary dilatation. Pancreas: Unremarkable. No pancreatic ductal dilatation or surrounding inflammatory changes. Spleen: The spleen is normal. Adrenals/Urinary Tract: The adrenal glands are unremarkable. Normal appearance of both kidneys. The urinary bladder is normal. Stomach/Bowel: The stomach is normal. The small bowel loops have a normal course and caliber. No obstruction. No pathologic dilatation of the colon. Vascular/Lymphatic: Aortic atherosclerosis. No aneurysm. There is no upper abdominal adenopathy. No pelvic or inguinal adenopathy identified. Reproductive: The uterus and adnexal structures are unremarkable. Other: No free fluid or fluid collections. Musculoskeletal: Lytic lesion involving the left acetabulum is again identified. Tumor measures 4.3 x 2.1 cm, image 96 of series 2. 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 IMPRESSION: 1. There is been interval progression of thoracic adenopathy. 2. Lytic lesion involving the left acetabulum is similar in size to previous exam. 3. No evidence for adenopathy within the abdomen or pelvis. Electronically Signed   By: TKerby MoorsM.D.   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  PLAN:    1. Recurrent follicular lymphoma: CT scan results from June 11, 2017 reviewed independently and reported as above with continued progression  of disease. She has now completed XRT to her painful right lymph node. Patient has consultation with radiation oncology in approximately 2 weeks at which time we will begin the process of ordering and scheduling Zevalin. Patient received 4 cycles of single agent rituximab completing on October 02, 2016. After lengthy discussion with the patient, she wishes to proceed with treatment using Zevalin. Patient will receive 250 mg/m of Rituxan on day 1 and then a second infusion on day 7, 8, or 9. Patient will then receive her Zevalin  dosing 4 hours after her second infusion of Rituxan. Will have to monitor for severe and prolonged cytopenias as well as mucocutaneous reactions.  Patient will return to clinic in the next several weeks to coordinate treatment.  2. Hyperglycemia: Resolved.  Approximately 30 minutes was spent in discussion of which greater than 50% was consultation.  Patient expressed understanding and was in agreement with this plan. She also understands that She can call clinic at any time with any questions, concerns, or complaints.   Cancer Staging Follicular lymphoma ([place]) Staging form: Lymphoid Neoplasms, AJCC 6th Edition - Clinical stage from 08/27/2016: Stage IIE (lymphoma only) - Signed by Lloyd Huger, MD on 08/27/2016   Lloyd Huger, MD   06/16/2017 8:56 AM

## 2017-06-13 ENCOUNTER — Inpatient Hospital Stay: Payer: Medicare Other | Attending: Oncology | Admitting: Oncology

## 2017-06-13 VITALS — BP 112/64 | HR 94 | Temp 97.7°F | Resp 18 | Wt 121.9 lb

## 2017-06-13 DIAGNOSIS — Z803 Family history of malignant neoplasm of breast: Secondary | ICD-10-CM | POA: Diagnosis not present

## 2017-06-13 DIAGNOSIS — E785 Hyperlipidemia, unspecified: Secondary | ICD-10-CM | POA: Diagnosis not present

## 2017-06-13 DIAGNOSIS — I700 Atherosclerosis of aorta: Secondary | ICD-10-CM | POA: Diagnosis not present

## 2017-06-13 DIAGNOSIS — Z801 Family history of malignant neoplasm of trachea, bronchus and lung: Secondary | ICD-10-CM

## 2017-06-13 DIAGNOSIS — C8204 Follicular lymphoma grade I, lymph nodes of axilla and upper limb: Secondary | ICD-10-CM

## 2017-06-13 DIAGNOSIS — M818 Other osteoporosis without current pathological fracture: Secondary | ICD-10-CM | POA: Diagnosis not present

## 2017-06-13 DIAGNOSIS — Z79899 Other long term (current) drug therapy: Secondary | ICD-10-CM

## 2017-06-13 DIAGNOSIS — Z86010 Personal history of colonic polyps: Secondary | ICD-10-CM | POA: Insufficient documentation

## 2017-06-13 DIAGNOSIS — Z923 Personal history of irradiation: Secondary | ICD-10-CM | POA: Insufficient documentation

## 2017-06-13 DIAGNOSIS — Z808 Family history of malignant neoplasm of other organs or systems: Secondary | ICD-10-CM | POA: Insufficient documentation

## 2017-06-13 DIAGNOSIS — M129 Arthropathy, unspecified: Secondary | ICD-10-CM | POA: Diagnosis not present

## 2017-06-13 NOTE — Progress Notes (Signed)
Patient is here today for follow up, she mentions a lump on the left side of her neck.

## 2017-07-03 ENCOUNTER — Telehealth: Payer: Self-pay | Admitting: *Deleted

## 2017-07-03 NOTE — Telephone Encounter (Signed)
Veronica Boyd called daughter and has set appointment to discuss Zevalin treatment on 9/4, will coordinate with Med Onc after that visit per Dr Grayland Ormond

## 2017-07-03 NOTE — Telephone Encounter (Signed)
Daughter Hubbard Robinson called upset that patient's appointment has been moved for 3 weeks out with Radiologist and her mother is not getting any treatment she is frustrated and was in tears asking that Dr Grayland Ormond call her this morning if at all possible. Her 1 mo fu appointment with Dr Baruch Gouty was rescheduled form 8/30 to 9/16. I spoke with Isidore Moos who said that the patient was fine with the appointment change, but will call daughter to change appointment to 9/6. She has no follow up with [facility] at all.Please return call to Ray at [phone] and advise when patient needs to see you, her last XRT was on 06/06/17

## 2017-07-04 ENCOUNTER — Ambulatory Visit: Payer: Medicare Other | Admitting: Radiation Oncology

## 2017-07-09 ENCOUNTER — Ambulatory Visit
Admission: RE | Admit: 2017-07-09 | Discharge: 2017-07-09 | Disposition: A | Discharge status: Home / Self Care | Payer: Medicare Other | Source: Ambulatory Visit | Attending: Radiation Oncology | Admitting: Radiation Oncology

## 2017-07-09 ENCOUNTER — Encounter: Payer: Self-pay | Admitting: Radiation Oncology

## 2017-07-09 VITALS — BP 133/81 | HR 91 | Temp 96.1°F | Wt 121.7 lb

## 2017-07-09 DIAGNOSIS — Z923 Personal history of irradiation: Secondary | ICD-10-CM | POA: Diagnosis not present

## 2017-07-09 DIAGNOSIS — C8294 Follicular lymphoma, unspecified, lymph nodes of axilla and upper limb: Secondary | ICD-10-CM | POA: Diagnosis not present

## 2017-07-09 DIAGNOSIS — M25511 Pain in right shoulder: Secondary | ICD-10-CM | POA: Diagnosis not present

## 2017-07-09 DIAGNOSIS — C8298 Follicular lymphoma, unspecified, lymph nodes of multiple sites: Secondary | ICD-10-CM

## 2017-07-09 NOTE — Progress Notes (Signed)
Radiation Oncology Follow up Note  Name: Veronica Boyd   Date:   07/09/2017 MRN:  [id number] DOB: 1946-06-06    This 71 y.o. 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  REFERRING PROVIDER: Glendon Axe, MD  HPI: Patient is a 71 year old female now out 1 month having completed external beam radiation therapy to her right axilla for follicular lymphoma. She seen today in follow-up doing fairly well area the right axilla has shrunk but stabilized measuring approximately 3 cm. She also has left supraclavicular palpable adenopathy. She's also complaining of one-week history of right shoulder pain she is taking ibuprofen for that. I have suggested Aleve twice a day with ice she also has tramadol for excessive pain..  COMPLICATIONS OF TREATMENT: none  FOLLOW UP COMPLIANCE: keeps appointments   PHYSICAL EXAM:  BP 133/81   Pulse 91   Temp (!) 96.1 F (35.6 C)   Wt 121 lb 11.1 oz (55.2 kg)   BMI 24.58 kg/m  Palpable adenopathy in the right axilla and left supraclavicular fossa stated above Well-developed well-nourished patient in NAD. HEENT reveals PERLA, EOMI, discs not visualized.  Oral cavity is clear. No oral mucosal lesions are identified. Neck is clear without evidence of cervical or supraclavicular adenopathy. Lungs are clear to A&P. Cardiac examination is essentially unremarkable with regular rate and rhythm without murmur rub or thrill. Abdomen is benign with no organomegaly or masses noted. Motor sensory and DTR levels are equal and symmetric in the upper and lower extremities. Cranial nerves II through XII are grossly intact. Proprioception is intact. No peripheral adenopathy or edema is identified. No motor or sensory levels are noted. Crude visual fields are within normal range.  RADIOLOGY RESULTS: No current films for review  PLAN: At this time we are recommending Zevalin therapy. We'll  coordinate her initial treatment with Rituxan followed by Rituxan plus Zevalin with medical oncology. Risks and benefits of treatment including allergic reaction suppression of marrow nausea vomiting possible diarrhea all were explained in detail to the patient and her daughter. We'll go ahead and coordinate her Zevalin treatments.  I would like to take this opportunity to thank you for allowing me to participate in the care of your patient.Armstead Peaks., MD

## 2017-07-12 ENCOUNTER — Other Ambulatory Visit: Payer: Self-pay | Admitting: Oncology

## 2017-07-12 NOTE — Progress Notes (Signed)
[place]  Telephone:(336) [phone] [phone]) [phone]  ID: Veronica Boyd OB: Nov 12, 1945  MR#: [id number]  QVZ#:[id number]  Patient Care Team: Glendon Axe, MD as PCP - General (Internal Medicine) Leonie Green, MD as Referring Physician (Surgery)  CHIEF COMPLAINT: Recurrent follicular lymphoma  INTERVAL HISTORY: Patient returns to clinic today for further evaluation and initiation of weekly Rituxan. She has right shoulder pain, but otherwise feels well. She has new increased lymphadenopathy on her left neck. She has no neurologic complaints. She denies any fevers, night sweats, or weight loss. She has no chest pain or shortness of breath. She has a poor appetite, but denies any nausea, vomiting, constipation, or diarrhea. She has no urinary complaint. Patient offers no further specific complaints today.  REVIEW OF SYSTEMS:   Review of Systems  Constitutional: Negative.  Negative for fever, malaise/fatigue and weight loss.  Respiratory: Negative.  Negative for cough and shortness of breath.   Cardiovascular: Negative.  Negative for chest pain and leg swelling.  Gastrointestinal: Negative.  Negative for abdominal pain, blood in stool, melena, nausea and vomiting.  Genitourinary: Negative.   Musculoskeletal: Positive for joint pain.  Skin: Negative.  Negative for rash.  Neurological: Negative.  Negative for sensory change and weakness.  Psychiatric/Behavioral: Negative.  The patient is not nervous/anxious.    As per HPI. Otherwise, a complete review of systems is negative.   PAST MEDICAL HISTORY: Past Medical History:  Diagnosis Date  . Arthritis   . Cataract    bilateral  . Chicken pox   . Colon polyp   . Follicular lymphoma ([place]) 08/2016   lymph nodes   . Hyperlipidemia   . Osteoporosis     PAST SURGICAL HISTORY: Past Surgical History:  Procedure Laterality Date  . AXILLARY LYMPH NODE DISSECTION Right 08/21/2016   Procedure: AXILLARY  LYMPH NODE excision;  Surgeon: Leonie Green, MD;  Location: [facility] ORS;  Service: General;  Laterality: Right;  . CATARACT EXTRACTION, BILATERAL Bilateral   . JOINT REPLACEMENT Left   . TONSILLECTOMY    . TOTAL HIP ARTHROPLASTY Left 1992    FAMILY HISTORY: Family History  Problem Relation Age of Onset  . Diabetes Sister   . Lung cancer Brother   . Diabetes Brother   . Basal cell carcinoma Daughter   . Breast cancer Paternal Aunt     ADVANCED DIRECTIVES (Y/N):  N  HEALTH MAINTENANCE: Social History  Substance Use Topics  . Smoking status: Never Smoker  . Smokeless tobacco: Never Used  . Alcohol use No     Colonoscopy:  PAP:  Bone density:  Lipid panel:  No Known Allergies  Current Outpatient Prescriptions  Medication Sig Dispense Refill  . alendronate (FOSAMAX) 70 MG tablet Take 70 mg by mouth once a week. Take with a full glass of water on an empty stomach.    . Cholecalciferol (VITAMIN D-3) 1000 units CAPS Take 1,000 Units by mouth daily.    . meloxicam (MOBIC) 15 MG tablet Take 1 tablet by mouth daily.    . Multiple Vitamins-Minerals (CENTRUM SILVER PO) Take 1 tablet by mouth daily.    . ondansetron (ZOFRAN-ODT) 8 MG disintegrating tablet Take 1 tablet (8 mg total) by mouth every 8 (eight) hours as needed for nausea or vomiting. 30 tablet 2  . pantoprazole (PROTONIX) 20 MG tablet Take 1 tablet by mouth daily.    . simvastatin (ZOCOR) 20 MG tablet Take 1 tablet by mouth at bedtime.     Marland Kitchen tiZANidine (  ZANAFLEX) 2 MG tablet Take 1 tablet by mouth at bedtime as needed.     . traMADol-acetaminophen (ULTRACET) 37.5-325 MG tablet Take 1 tablet by mouth every 8 (eight) hours as needed.      No current facility-administered medications for this visit.    Facility-Administered Medications Ordered in Other Visits  Medication Dose Route Frequency Provider Last Rate Last Dose  . 0.9 %  sodium chloride infusion   Intravenous Once Lloyd Huger, MD      . acetaminophen  (TYLENOL) tablet 650 mg  650 mg Oral Once Lloyd Huger, MD      . diphenhydrAMINE (BENADRYL) capsule 25 mg  25 mg Oral Once Lloyd Huger, MD      . riTUXimab (RITUXAN) 400 mg in sodium chloride 0.9 % 250 mL (1.3793 mg/mL) chemo infusion  250 mg/m2 (Treatment Plan Recorded) Intravenous Once Lloyd Huger, MD        OBJECTIVE: Vitals:   07/16/17 0903  BP: 118/79  Pulse: 96  Resp: 18  Temp: (!) 97.3 F (36.3 C)     Body mass index is 24.34 kg/m.    ECOG FS:0 - Asymptomatic  General: Well-developed, well-nourished, no acute distress. Eyes: Pink conjunctiva, anicteric sclera. Lungs: Clear to auscultation bilaterally. Heart: Regular rate and rhythm. No rubs, murmurs, or gallops. Abdomen: Soft, nontender, nondistended. No organomegaly noted, normoactive bowel sounds. Musculoskeletal: No edema, cyanosis, or clubbing. Neuro: Alert, answering all questions appropriately. Cranial nerves grossly intact. Skin: No rashes or petechiae noted. Psych: Normal affect. Lymphatics: Easily palpable lymphadenopathy in right axilla. Fullness noted surrounding right clavicle. New increasing lymphadenopathy in left cervical chain.  LAB RESULTS:  Lab Results  Component Value Date   NA 140 07/16/2017   K 4.0 07/16/2017   CL 101 07/16/2017   CO2 29 07/16/2017   GLUCOSE 112 (H) 07/16/2017   BUN 12 07/16/2017   CREATININE 0.84 07/16/2017   CALCIUM 9.8 07/16/2017   PROT 6.7 07/16/2017   ALBUMIN 4.1 07/16/2017   AST 28 07/16/2017   ALT 19 07/16/2017   ALKPHOS 63 07/16/2017   BILITOT 0.8 07/16/2017   GFRNONAA >60 07/16/2017   GFRAA >60 07/16/2017    Lab Results  Component Value Date   WBC 6.3 07/16/2017   NEUTROABS 3.7 07/16/2017   HGB 14.8 07/16/2017   HCT 42.7 07/16/2017   MCV 91.7 07/16/2017   PLT 271 07/16/2017     STUDIES: No results found.  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  PLAN:    1. Recurrent follicular lymphoma: CT scan  results from June 11, 2017 reviewed independently with continued progression of disease. She completed XRT to her painful right lymph node. Patient received 4 cycles of single agent rituximab completing on October 02, 2016. Proceed with 250 mg/m of Rituxan today and then a second infusion in 1 week. Patient will then receive her Zevalin dosing 4 hours after her second infusion of Rituxan. Will have to monitor for severe and prolonged cytopenias as well as mucocutaneous reactions.  Return to clinic in 1 week for further evaluation, laboratory work, Rituxan and Zevalin.  2. Hyperglycemia: Improved. 3. Shoulder pain: Unclear etiology, possibly related to underlying lymphoma. If does not improve with treatment will consider MRI in the future.   Approximately 30 minutes was spent in discussion of which greater than 50% was consultation.  Patient expressed understanding and was in agreement with this plan. She also understands that She can call clinic at any time with any  questions, concerns, or complaints.   Cancer Staging Follicular lymphoma ([place]) Staging form: Lymphoid Neoplasms, AJCC 6th Edition - Clinical stage from 08/27/2016: Stage IIE (lymphoma only) - Signed by Lloyd Huger, MD on 08/27/2016   Lloyd Huger, MD   07/16/2017 10:05 AM

## 2017-07-16 ENCOUNTER — Inpatient Hospital Stay (HOSPITAL_BASED_OUTPATIENT_CLINIC_OR_DEPARTMENT_OTHER): Payer: Medicare Other | Admitting: Oncology

## 2017-07-16 ENCOUNTER — Inpatient Hospital Stay: Payer: Medicare Other | Attending: Oncology

## 2017-07-16 ENCOUNTER — Inpatient Hospital Stay: Payer: Medicare Other

## 2017-07-16 VITALS — BP 118/79 | HR 96 | Temp 97.3°F | Resp 18 | Wt 120.5 lb

## 2017-07-16 DIAGNOSIS — N644 Mastodynia: Secondary | ICD-10-CM | POA: Diagnosis not present

## 2017-07-16 DIAGNOSIS — E785 Hyperlipidemia, unspecified: Secondary | ICD-10-CM

## 2017-07-16 DIAGNOSIS — M25511 Pain in right shoulder: Secondary | ICD-10-CM | POA: Insufficient documentation

## 2017-07-16 DIAGNOSIS — Z803 Family history of malignant neoplasm of breast: Secondary | ICD-10-CM | POA: Insufficient documentation

## 2017-07-16 DIAGNOSIS — N63 Unspecified lump in unspecified breast: Secondary | ICD-10-CM | POA: Diagnosis not present

## 2017-07-16 DIAGNOSIS — R0602 Shortness of breath: Secondary | ICD-10-CM | POA: Insufficient documentation

## 2017-07-16 DIAGNOSIS — Z86010 Personal history of colonic polyps: Secondary | ICD-10-CM | POA: Insufficient documentation

## 2017-07-16 DIAGNOSIS — M810 Age-related osteoporosis without current pathological fracture: Secondary | ICD-10-CM | POA: Diagnosis not present

## 2017-07-16 DIAGNOSIS — Z5112 Encounter for antineoplastic immunotherapy: Secondary | ICD-10-CM | POA: Diagnosis not present

## 2017-07-16 DIAGNOSIS — C8208 Follicular lymphoma grade I, lymph nodes of multiple sites: Secondary | ICD-10-CM | POA: Insufficient documentation

## 2017-07-16 DIAGNOSIS — Z808 Family history of malignant neoplasm of other organs or systems: Secondary | ICD-10-CM | POA: Diagnosis not present

## 2017-07-16 DIAGNOSIS — Z801 Family history of malignant neoplasm of trachea, bronchus and lung: Secondary | ICD-10-CM | POA: Diagnosis not present

## 2017-07-16 DIAGNOSIS — R739 Hyperglycemia, unspecified: Secondary | ICD-10-CM

## 2017-07-16 DIAGNOSIS — R05 Cough: Secondary | ICD-10-CM | POA: Insufficient documentation

## 2017-07-16 DIAGNOSIS — Z79899 Other long term (current) drug therapy: Secondary | ICD-10-CM

## 2017-07-16 DIAGNOSIS — C8298 Follicular lymphoma, unspecified, lymph nodes of multiple sites: Secondary | ICD-10-CM

## 2017-07-16 DIAGNOSIS — Z8619 Personal history of other infectious and parasitic diseases: Secondary | ICD-10-CM | POA: Diagnosis not present

## 2017-07-16 DIAGNOSIS — M7989 Other specified soft tissue disorders: Secondary | ICD-10-CM | POA: Insufficient documentation

## 2017-07-16 DIAGNOSIS — Z923 Personal history of irradiation: Secondary | ICD-10-CM | POA: Diagnosis not present

## 2017-07-16 DIAGNOSIS — G893 Neoplasm related pain (acute) (chronic): Secondary | ICD-10-CM | POA: Insufficient documentation

## 2017-07-16 LAB — CBC WITH DIFFERENTIAL/PLATELET
Basophils Absolute: 0.1 10*3/uL (ref 0–0.1)
Basophils Relative: 1 %
EOS PCT: 8 %
Eosinophils Absolute: 0.5 10*3/uL (ref 0–0.7)
HEMATOCRIT: 42.7 % (ref 35.0–47.0)
Hemoglobin: 14.8 g/dL (ref 12.0–16.0)
LYMPHS PCT: 15 %
Lymphs Abs: 0.9 10*3/uL — ABNORMAL LOW (ref 1.0–3.6)
MCH: 31.7 pg (ref 26.0–34.0)
MCHC: 34.6 g/dL (ref 32.0–36.0)
MCV: 91.7 fL (ref 80.0–100.0)
MONO ABS: 1.1 10*3/uL — AB (ref 0.2–0.9)
MONOS PCT: 17 %
NEUTROS ABS: 3.7 10*3/uL (ref 1.4–6.5)
Neutrophils Relative %: 59 %
PLATELETS: 271 10*3/uL (ref 150–440)
RBC: 4.65 MIL/uL (ref 3.80–5.20)
RDW: 13.1 % (ref 11.5–14.5)
WBC: 6.3 10*3/uL (ref 3.6–11.0)

## 2017-07-16 LAB — COMPREHENSIVE METABOLIC PANEL
ALT: 19 U/L (ref 14–54)
ANION GAP: 10 (ref 5–15)
AST: 28 U/L (ref 15–41)
Albumin: 4.1 g/dL (ref 3.5–5.0)
Alkaline Phosphatase: 63 U/L (ref 38–126)
BILIRUBIN TOTAL: 0.8 mg/dL (ref 0.3–1.2)
BUN: 12 mg/dL (ref 6–20)
CHLORIDE: 101 mmol/L (ref 101–111)
CO2: 29 mmol/L (ref 22–32)
Calcium: 9.8 mg/dL (ref 8.9–10.3)
Creatinine, Ser: 0.84 mg/dL (ref 0.44–1.00)
Glucose, Bld: 112 mg/dL — ABNORMAL HIGH (ref 65–99)
POTASSIUM: 4 mmol/L (ref 3.5–5.1)
Sodium: 140 mmol/L (ref 135–145)
TOTAL PROTEIN: 6.7 g/dL (ref 6.5–8.1)

## 2017-07-16 MED ORDER — ACETAMINOPHEN 325 MG PO TABS: 650.0000 mg | ORAL_TABLET | Freq: Once | ORAL | Status: DC

## 2017-07-16 MED ORDER — SODIUM CHLORIDE 0.9 % IV SOLN: 250.0000 mg/m2 | Freq: Once | INTRAVENOUS | Status: DC

## 2017-07-16 MED ORDER — SODIUM CHLORIDE 0.9 % IV SOLN
Freq: Once | INTRAVENOUS | Status: DC
  Filled 2017-07-16: qty 1000

## 2017-07-16 MED ORDER — DIPHENHYDRAMINE HCL 25 MG PO CAPS: 25.0000 mg | ORAL_CAPSULE | Freq: Once | ORAL | Status: DC

## 2017-07-16 NOTE — Progress Notes (Signed)
Hold treatment today per radiation.  Treatment to be rescheduled per [name], RN.

## 2017-07-18 ENCOUNTER — Other Ambulatory Visit: Payer: Self-pay | Admitting: Radiation Oncology

## 2017-07-18 ENCOUNTER — Telehealth: Payer: Self-pay | Admitting: Oncology

## 2017-07-18 DIAGNOSIS — C8298 Follicular lymphoma, unspecified, lymph nodes of multiple sites: Secondary | ICD-10-CM

## 2017-07-18 NOTE — Telephone Encounter (Signed)
Lab/MD/RITUXAN, per 07/18/17 schd msg/Kendra York. MD @ 8:45, per Tillie Rung.  Appt mailed.

## 2017-07-22 ENCOUNTER — Telehealth: Payer: Self-pay | Admitting: *Deleted

## 2017-07-22 NOTE — Telephone Encounter (Signed)
Will further evaluate patient tomorrow.

## 2017-07-22 NOTE — Progress Notes (Signed)
[place]  Telephone:(336) [phone] [phone]) [phone]  ID: [name] OB: May 02, 1946  MR#: [id number]  [id number]#:[id number]  Patient Care Team: Glendon Axe, MD as PCP - General (Internal Medicine) Leonie Green, MD as Referring Physician (Surgery)  CHIEF COMPLAINT: Recurrent follicular lymphoma  INTERVAL HISTORY: Patient returns to clinic today for further evaluation and re-initiation of weekly Rituxan. Treatment was delayed last week secondary to Zevalin not being ready for administration. She continues to have right shoulder pain. She is also complaining of increased shortness of breath. She has new increased lymphadenopathy on her left neck. She has no neurologic complaints. She denies any fevers, night sweats, or weight loss. She has no chest pain or shortness of breath. She has a poor appetite, but denies any nausea, vomiting, constipation, or diarrhea. She has no urinary complaint. Patient offers no further specific complaints today.  REVIEW OF SYSTEMS:   Review of Systems  Constitutional: Negative.  Negative for fever, malaise/fatigue and weight loss.  Respiratory: Positive for shortness of breath. Negative for cough.   Cardiovascular: Negative.  Negative for chest pain and leg swelling.  Gastrointestinal: Negative.  Negative for abdominal pain, blood in stool, melena, nausea and vomiting.  Genitourinary: Negative.   Musculoskeletal: Positive for joint pain.  Skin: Negative.  Negative for rash.  Neurological: Negative.  Negative for sensory change and weakness.  Psychiatric/Behavioral: Negative.  The patient is not nervous/anxious.    As per HPI. Otherwise, a complete review of systems is negative.   PAST MEDICAL HISTORY: Past Medical History:  Diagnosis Date  . Arthritis   . Cataract    bilateral  . Chicken pox   . Colon polyp   . Follicular lymphoma ([place]) 08/2016   lymph nodes   . Hyperlipidemia   . Osteoporosis     PAST SURGICAL  HISTORY: Past Surgical History:  Procedure Laterality Date  . AXILLARY LYMPH NODE DISSECTION Right 08/21/2016   Procedure: AXILLARY LYMPH NODE excision;  Surgeon: Leonie Green, MD;  Location: [facility] ORS;  Service: General;  Laterality: Right;  . CATARACT EXTRACTION, BILATERAL Bilateral   . JOINT REPLACEMENT Left   . TONSILLECTOMY    . TOTAL HIP ARTHROPLASTY Left 1992    FAMILY HISTORY: Family History  Problem Relation Age of Onset  . Diabetes Sister   . Lung cancer Brother   . Diabetes Brother   . Basal cell carcinoma Daughter   . Breast cancer Paternal Aunt     ADVANCED DIRECTIVES (Y/N):  N  HEALTH MAINTENANCE: Social History  Substance Use Topics  . Smoking status: Never Smoker  . Smokeless tobacco: Never Used  . Alcohol use No     Colonoscopy:  PAP:  Bone density:  Lipid panel:  No Known Allergies  Current Outpatient Prescriptions  Medication Sig Dispense Refill  . alendronate (FOSAMAX) 70 MG tablet Take 70 mg by mouth once a week. Take with a full glass of water on an empty stomach.    . Cholecalciferol (VITAMIN D-3) 1000 units CAPS Take 1,000 Units by mouth daily.    . meloxicam (MOBIC) 15 MG tablet Take 1 tablet by mouth daily.    . Multiple Vitamins-Minerals (CENTRUM SILVER PO) Take 1 tablet by mouth daily.    . ondansetron (ZOFRAN-ODT) 8 MG disintegrating tablet Take 1 tablet (8 mg total) by mouth every 8 (eight) hours as needed for nausea or vomiting. 30 tablet 2  . pantoprazole (PROTONIX) 20 MG tablet Take 1 tablet by mouth daily.    Marland Kitchen  simvastatin (ZOCOR) 20 MG tablet Take 1 tablet by mouth at bedtime.     Marland Kitchen tiZANidine (ZANAFLEX) 2 MG tablet Take 1 tablet by mouth at bedtime as needed.     . traMADol-acetaminophen (ULTRACET) 37.5-325 MG tablet Take 1 tablet by mouth every 8 (eight) hours as needed.     Marland Kitchen albuterol (PROVENTIL HFA;VENTOLIN HFA) 108 (90 Base) MCG/ACT inhaler Inhale 2 puffs into the lungs every 6 (six) hours as needed for wheezing or  shortness of breath. 1 Inhaler 2   No current facility-administered medications for this visit.     OBJECTIVE: Vitals:   07/23/17 0909  BP: 134/82  Pulse: 98  Resp: 18  Temp: (!) 97.1 F (36.2 C)     Body mass index is 24.36 kg/m.    ECOG FS:0 - Asymptomatic  General: Well-developed, well-nourished, no acute distress. Eyes: Pink conjunctiva, anicteric sclera. Lungs: Clear to auscultation bilaterally. Heart: Regular rate and rhythm. No rubs, murmurs, or gallops. Abdomen: Soft, nontender, nondistended. No organomegaly noted, normoactive bowel sounds. Musculoskeletal: No edema, cyanosis, or clubbing. Neuro: Alert, answering all questions appropriately. Cranial nerves grossly intact. Skin: No rashes or petechiae noted. Psych: Normal affect. Lymphatics: Easily palpable lymphadenopathy in right axilla. Fullness noted surrounding right clavicle. New increasing lymphadenopathy in left cervical chain.  LAB RESULTS:  Lab Results  Component Value Date   NA 137 07/23/2017   K 3.6 07/23/2017   CL 101 07/23/2017   CO2 25 07/23/2017   GLUCOSE 133 (H) 07/23/2017   BUN 13 07/23/2017   CREATININE 0.92 07/23/2017   CALCIUM 9.8 07/23/2017   PROT 6.6 07/23/2017   ALBUMIN 3.8 07/23/2017   AST 30 07/23/2017   ALT 16 07/23/2017   ALKPHOS 69 07/23/2017   BILITOT 0.7 07/23/2017   GFRNONAA >60 07/23/2017   GFRAA >60 07/23/2017    Lab Results  Component Value Date   WBC 6.8 07/23/2017   NEUTROABS 4.3 07/23/2017   HGB 14.5 07/23/2017   HCT 41.8 07/23/2017   MCV 90.6 07/23/2017   PLT 276 07/23/2017     STUDIES: No results found.  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  PLAN:    1. Recurrent follicular lymphoma: CT scan results from June 11, 2017 reviewed independently with continued progression of disease. She completed XRT to her painful right lymph node. Patient received 4 cycles of single agent rituximab completing on October 02, 2016. Proceed with  250 mg/m of Rituxan today and then a second infusion in 1 week. Patient will then receive her Zevalin dosing 4 hours after her second infusion of Rituxan. Will have to monitor for severe and prolonged cytopenias as well as mucocutaneous reactions.  Return to clinic in 1 week for further evaluation, laboratory work, Rituxan and Zevalin.  2. Hyperglycemia: Improved. 3. Shoulder pain: Unclear etiology, possibly related to underlying lymphoma. If does not improve with treatment will consider MRI in the future.  4. Shortness of breath: Unclear etiology. Patient was given albuterol inhaler and instructed to call clinic if her symptoms do not improve.  Approximately 30 minutes was spent in discussion of which greater than 50% was consultation.  Patient expressed understanding and was in agreement with this plan. She also understands that She can call clinic at any time with any questions, concerns, or complaints.   Cancer Staging Follicular lymphoma ([place]) Staging form: Lymphoid Neoplasms, AJCC 6th Edition - Clinical stage from 08/27/2016: Stage IIE (lymphoma only) - Signed by Lloyd Huger, MD on 08/27/2016   Christia Reading  Bridget Hartshorn, MD   07/24/2017 1:56 PM

## 2017-07-22 NOTE — Telephone Encounter (Signed)
Daughter called to report that her cough is getting worse and that she is now having shortness of breath. Please advise

## 2017-07-22 NOTE — Telephone Encounter (Signed)
Veronica Boyd advised per VO Dr Grayland Ormond that she has an appointment tomorrow and he will address this at that time. She was in agreement with this plan

## 2017-07-23 ENCOUNTER — Inpatient Hospital Stay: Payer: Medicare Other

## 2017-07-23 ENCOUNTER — Inpatient Hospital Stay (HOSPITAL_BASED_OUTPATIENT_CLINIC_OR_DEPARTMENT_OTHER): Payer: Medicare Other | Admitting: Oncology

## 2017-07-23 VITALS — BP 140/70 | HR 88 | Resp 20

## 2017-07-23 VITALS — BP 134/82 | HR 98 | Temp 97.1°F | Resp 18 | Wt 120.6 lb

## 2017-07-23 DIAGNOSIS — Z79899 Other long term (current) drug therapy: Secondary | ICD-10-CM | POA: Diagnosis not present

## 2017-07-23 DIAGNOSIS — M810 Age-related osteoporosis without current pathological fracture: Secondary | ICD-10-CM

## 2017-07-23 DIAGNOSIS — R739 Hyperglycemia, unspecified: Secondary | ICD-10-CM | POA: Diagnosis not present

## 2017-07-23 DIAGNOSIS — R0602 Shortness of breath: Secondary | ICD-10-CM | POA: Diagnosis not present

## 2017-07-23 DIAGNOSIS — Z86010 Personal history of colonic polyps: Secondary | ICD-10-CM | POA: Diagnosis not present

## 2017-07-23 DIAGNOSIS — Z803 Family history of malignant neoplasm of breast: Secondary | ICD-10-CM

## 2017-07-23 DIAGNOSIS — C8208 Follicular lymphoma grade I, lymph nodes of multiple sites: Secondary | ICD-10-CM

## 2017-07-23 DIAGNOSIS — C8298 Follicular lymphoma, unspecified, lymph nodes of multiple sites: Secondary | ICD-10-CM

## 2017-07-23 DIAGNOSIS — M25511 Pain in right shoulder: Secondary | ICD-10-CM

## 2017-07-23 DIAGNOSIS — Z808 Family history of malignant neoplasm of other organs or systems: Secondary | ICD-10-CM

## 2017-07-23 DIAGNOSIS — Z801 Family history of malignant neoplasm of trachea, bronchus and lung: Secondary | ICD-10-CM | POA: Diagnosis not present

## 2017-07-23 DIAGNOSIS — Z8619 Personal history of other infectious and parasitic diseases: Secondary | ICD-10-CM | POA: Diagnosis not present

## 2017-07-23 DIAGNOSIS — E785 Hyperlipidemia, unspecified: Secondary | ICD-10-CM | POA: Diagnosis not present

## 2017-07-23 LAB — CBC WITH DIFFERENTIAL/PLATELET
BASOS ABS: 0.1 10*3/uL (ref 0–0.1)
Basophils Relative: 1 %
Eosinophils Absolute: 0.4 10*3/uL (ref 0–0.7)
Eosinophils Relative: 6 %
HEMATOCRIT: 41.8 % (ref 35.0–47.0)
Hemoglobin: 14.5 g/dL (ref 12.0–16.0)
LYMPHS PCT: 15 %
Lymphs Abs: 1 10*3/uL (ref 1.0–3.6)
MCH: 31.5 pg (ref 26.0–34.0)
MCHC: 34.7 g/dL (ref 32.0–36.0)
MCV: 90.6 fL (ref 80.0–100.0)
MONO ABS: 1 10*3/uL — AB (ref 0.2–0.9)
MONOS PCT: 15 %
NEUTROS ABS: 4.3 10*3/uL (ref 1.4–6.5)
Neutrophils Relative %: 63 %
Platelets: 276 10*3/uL (ref 150–440)
RBC: 4.62 MIL/uL (ref 3.80–5.20)
RDW: 13.3 % (ref 11.5–14.5)
WBC: 6.8 10*3/uL (ref 3.6–11.0)

## 2017-07-23 LAB — COMPREHENSIVE METABOLIC PANEL
ALK PHOS: 69 U/L (ref 38–126)
ALT: 16 U/L (ref 14–54)
AST: 30 U/L (ref 15–41)
Albumin: 3.8 g/dL (ref 3.5–5.0)
Anion gap: 11 (ref 5–15)
BUN: 13 mg/dL (ref 6–20)
CALCIUM: 9.8 mg/dL (ref 8.9–10.3)
CHLORIDE: 101 mmol/L (ref 101–111)
CO2: 25 mmol/L (ref 22–32)
CREATININE: 0.92 mg/dL (ref 0.44–1.00)
GFR calc Af Amer: >60 mL/min (ref >60)
Glucose, Bld: 133 mg/dL — ABNORMAL HIGH (ref 65–99)
Potassium: 3.6 mmol/L (ref 3.5–5.1)
Sodium: 137 mmol/L (ref 135–145)
Total Bilirubin: 0.7 mg/dL (ref 0.3–1.2)
Total Protein: 6.6 g/dL (ref 6.5–8.1)

## 2017-07-23 MED ORDER — DIPHENHYDRAMINE HCL 25 MG PO CAPS
25.0000 mg | ORAL_CAPSULE | Freq: Once | ORAL | Status: AC
  Administered 2017-07-23: 25 mg via ORAL
  Filled 2017-07-23: qty 1

## 2017-07-23 MED ORDER — HYDROCORTISONE NA SUCCINATE PF 100 MG IJ SOLR
100.0000 mg | Freq: Once | INTRAMUSCULAR | Status: AC
  Administered 2017-07-23: 100 mg via INTRAVENOUS

## 2017-07-23 MED ORDER — SODIUM CHLORIDE 0.9 % IV SOLN
Freq: Once | INTRAVENOUS | Status: AC
  Administered 2017-07-23: 11:00:00 via INTRAVENOUS
  Filled 2017-07-23: qty 4

## 2017-07-23 MED ORDER — DIPHENHYDRAMINE HCL 50 MG/ML IJ SOLN
25.0000 mg | Freq: Once | INTRAMUSCULAR | Status: AC | PRN
  Administered 2017-07-23: 25 mg via INTRAVENOUS

## 2017-07-23 MED ORDER — SODIUM CHLORIDE 0.9 % IV SOLN
250.0000 mg/m2 | Freq: Once | INTRAVENOUS | Status: AC
  Administered 2017-07-23: 400 mg via INTRAVENOUS
  Filled 2017-07-23: qty 40

## 2017-07-23 MED ORDER — SODIUM CHLORIDE 0.9 % IV SOLN
Freq: Once | INTRAVENOUS | Status: AC
  Administered 2017-07-23: 10:00:00 via INTRAVENOUS
  Filled 2017-07-23: qty 1000

## 2017-07-23 MED ORDER — ACETAMINOPHEN 325 MG PO TABS
650.0000 mg | ORAL_TABLET | Freq: Once | ORAL | Status: AC
  Administered 2017-07-23: 650 mg via ORAL
  Filled 2017-07-23: qty 2

## 2017-07-23 MED ORDER — ALBUTEROL SULFATE HFA 108 (90 BASE) MCG/ACT IN AERS: 2.0000 | INHALATION_SPRAY | Freq: Four times a day (QID) | RESPIRATORY_TRACT | 2 refills | Status: DC | PRN

## 2017-07-23 NOTE — Progress Notes (Signed)
1253-pt went to bathroom, stated to April Solomon, RN that she was itching. Red splotches noted to patients' thighs, lower abdomen, and buttocks. Rituxan infusion stopped, IV Benadryl and Solucortef given. Dr Grayland Ormond made aware. VSS. Will continue to monitor  1315-Pt eating lunch, reports not itching anymore. Will continue to monitor  1340-Restarted Rituxan, pt continues to be asymptomatic  1500-Rituxan finished. Pt finished with no no further complications.

## 2017-07-23 NOTE — Progress Notes (Signed)
Patient here today for follow up regarding lymphoma. Patient reports cough and shortness of breath, has been going on for about 2 weeks. Patient also reports nausea this morning.

## 2017-07-24 ENCOUNTER — Ambulatory Visit: Payer: Medicare Other | Admitting: Radiation Oncology

## 2017-07-25 ENCOUNTER — Inpatient Hospital Stay (HOSPITAL_BASED_OUTPATIENT_CLINIC_OR_DEPARTMENT_OTHER): Payer: Medicare Other | Admitting: Oncology

## 2017-07-25 ENCOUNTER — Telehealth: Payer: Self-pay | Admitting: *Deleted

## 2017-07-25 ENCOUNTER — Inpatient Hospital Stay: Payer: Medicare Other

## 2017-07-25 VITALS — BP 160/83 | HR 102 | Temp 97.7°F | Resp 20 | Wt 122.7 lb

## 2017-07-25 DIAGNOSIS — R0602 Shortness of breath: Secondary | ICD-10-CM | POA: Diagnosis not present

## 2017-07-25 DIAGNOSIS — M25511 Pain in right shoulder: Secondary | ICD-10-CM | POA: Diagnosis not present

## 2017-07-25 DIAGNOSIS — R05 Cough: Secondary | ICD-10-CM | POA: Diagnosis not present

## 2017-07-25 DIAGNOSIS — G893 Neoplasm related pain (acute) (chronic): Secondary | ICD-10-CM

## 2017-07-25 DIAGNOSIS — N63 Unspecified lump in unspecified breast: Secondary | ICD-10-CM

## 2017-07-25 DIAGNOSIS — Z923 Personal history of irradiation: Secondary | ICD-10-CM

## 2017-07-25 DIAGNOSIS — M7989 Other specified soft tissue disorders: Secondary | ICD-10-CM | POA: Diagnosis not present

## 2017-07-25 DIAGNOSIS — C8201 Follicular lymphoma grade I, lymph nodes of head, face, and neck: Secondary | ICD-10-CM

## 2017-07-25 DIAGNOSIS — N644 Mastodynia: Secondary | ICD-10-CM

## 2017-07-25 DIAGNOSIS — Z8619 Personal history of other infectious and parasitic diseases: Secondary | ICD-10-CM

## 2017-07-25 DIAGNOSIS — Z79899 Other long term (current) drug therapy: Secondary | ICD-10-CM

## 2017-07-25 DIAGNOSIS — Z5189 Encounter for other specified aftercare: Secondary | ICD-10-CM

## 2017-07-25 DIAGNOSIS — R739 Hyperglycemia, unspecified: Secondary | ICD-10-CM | POA: Diagnosis not present

## 2017-07-25 DIAGNOSIS — Z86010 Personal history of colonic polyps: Secondary | ICD-10-CM

## 2017-07-25 DIAGNOSIS — E785 Hyperlipidemia, unspecified: Secondary | ICD-10-CM

## 2017-07-25 DIAGNOSIS — Z801 Family history of malignant neoplasm of trachea, bronchus and lung: Secondary | ICD-10-CM

## 2017-07-25 DIAGNOSIS — C8208 Follicular lymphoma grade I, lymph nodes of multiple sites: Secondary | ICD-10-CM

## 2017-07-25 DIAGNOSIS — Z803 Family history of malignant neoplasm of breast: Secondary | ICD-10-CM

## 2017-07-25 DIAGNOSIS — Z808 Family history of malignant neoplasm of other organs or systems: Secondary | ICD-10-CM

## 2017-07-25 DIAGNOSIS — M810 Age-related osteoporosis without current pathological fracture: Secondary | ICD-10-CM

## 2017-07-25 LAB — CBC WITH DIFFERENTIAL/PLATELET
BASOS PCT: 1 %
Basophils Absolute: 0.1 10*3/uL (ref 0–0.1)
EOS ABS: 0.7 10*3/uL (ref 0–0.7)
Eosinophils Relative: 10 %
HCT: 38.3 % (ref 35.0–47.0)
HEMOGLOBIN: 13.3 g/dL (ref 12.0–16.0)
Lymphocytes Relative: 11 %
Lymphs Abs: 0.7 10*3/uL — ABNORMAL LOW (ref 1.0–3.6)
MCH: 31.5 pg (ref 26.0–34.0)
MCHC: 34.8 g/dL (ref 32.0–36.0)
MCV: 90.3 fL (ref 80.0–100.0)
Monocytes Absolute: 1.1 10*3/uL — ABNORMAL HIGH (ref 0.2–0.9)
Monocytes Relative: 17 %
NEUTROS ABS: 4 10*3/uL (ref 1.4–6.5)
Neutrophils Relative %: 61 %
Platelets: 253 10*3/uL (ref 150–440)
RBC: 4.24 MIL/uL (ref 3.80–5.20)
RDW: 13.4 % (ref 11.5–14.5)
WBC: 6.6 10*3/uL (ref 3.6–11.0)

## 2017-07-25 LAB — COMPREHENSIVE METABOLIC PANEL
ALBUMIN: 3.7 g/dL (ref 3.5–5.0)
ALK PHOS: 69 U/L (ref 38–126)
ALT: 16 U/L (ref 14–54)
AST: 29 U/L (ref 15–41)
Anion gap: 12 (ref 5–15)
BUN: 14 mg/dL (ref 6–20)
CALCIUM: 9.5 mg/dL (ref 8.9–10.3)
CO2: 25 mmol/L (ref 22–32)
CREATININE: 0.88 mg/dL (ref 0.44–1.00)
Chloride: 101 mmol/L (ref 101–111)
GFR calc Af Amer: >60 mL/min (ref >60)
GFR calc non Af Amer: >60 mL/min (ref >60)
GLUCOSE: 96 mg/dL (ref 65–99)
Potassium: 3.5 mmol/L (ref 3.5–5.1)
SODIUM: 138 mmol/L (ref 135–145)
Total Bilirubin: 0.4 mg/dL (ref 0.3–1.2)
Total Protein: 6.2 g/dL — ABNORMAL LOW (ref 6.5–8.1)

## 2017-07-25 LAB — PHOSPHORUS: PHOSPHORUS: 2.9 mg/dL (ref 2.5–4.6)

## 2017-07-25 LAB — LACTIC ACID, PLASMA: Lactic Acid, Venous: 1.6 mmol/L (ref 0.5–1.9)

## 2017-07-25 LAB — LACTATE DEHYDROGENASE: LDH: 337 U/L — ABNORMAL HIGH (ref 98–192)

## 2017-07-25 LAB — URIC ACID: Uric Acid, Serum: 3.3 mg/dL (ref 2.3–6.6)

## 2017-07-25 MED ORDER — FENTANYL 12 MCG/HR TD PT72: 12.5000 ug | MEDICATED_PATCH | TRANSDERMAL | 0 refills | Status: DC

## 2017-07-25 NOTE — Telephone Encounter (Signed)
She can be seen by [facility] clinic this afternoon or tomorrow morning if needed.  Please include lab encounter.

## 2017-07-25 NOTE — Progress Notes (Signed)
Symptom Management Consult note [facility]  Telephone:([phone] [phone]  Patient Care Team: Glendon Axe, MD as PCP - General (Internal Medicine) Leonie Green, MD as Referring Physician (Surgery)   Name of the patient: Veronica Boyd  [id number]  Nov 16, 1945   Date of visit: 07/26/17  Diagnosis- Recurrent follicular lymphoma  Chief complaint/ Reason for visit- Pain Control   Heme/Onc history: Recurrent follicular lymphoma: CT scan results from June 11, 2017 reviewed independently with continued progression of disease. She completed XRT to her painful right lymph node. Patient received 4 cycles of single agent rituximab completing on October 02, 2016. Proceed with 250 mg/m of Rituxan today and then a second infusion in 1 week. Patient will then receive her Zevalin dosing 4 hours after her second infusion of Rituxan. Will have to monitor for severe and prolonged cytopenias as well as mucocutaneous reactions.  Return to clinic in 1 week for further evaluation, laboratory work, Rituxan and Zevalin.   Interval history- Patient presents today for worsening pain to right back that extends to right breast. The pain was so severe last night she was unable to sleep. She took tramadol for the pain without relief. She additionally tried ice and hot packs without relief. Eventually after several hours the pain began to get better and she was able to go to sleep. She also notes increased swelling in her right arm, chest and breast. She complains of a lingering cough or the past few weeks. She denies sputum production, fevers or flulike illness this. She was last seen by Dr. Maryjane Hurter on 07/23/2017 where she reinitiated Rituxan. She complained of right shoulder pain and increased shortness of breath. She also had metastatic lymphadenopathy of her left neck and right axilla. She is to return on Tuesday for additional Rituxan and Zevalin.   ECOG FS:1 -  Symptomatic but completely ambulatory  Review of systems- Review of Systems  Constitutional: Negative for chills, fever, malaise/fatigue and weight loss.  HENT: Negative.   Eyes: Negative.   Respiratory: Positive for cough. Negative for sputum production, shortness of breath and wheezing.   Cardiovascular: Negative.  Negative for chest pain, palpitations and orthopnea.  Gastrointestinal: Negative for abdominal pain, constipation, diarrhea, nausea and vomiting.  Genitourinary: Negative.   Musculoskeletal: Positive for back pain and myalgias.  Skin: Negative.   Neurological: Negative.  Negative for weakness.  Endo/Heme/Allergies: Negative.   Psychiatric/Behavioral: Negative.      Current treatment- Weekly Rituxin and Zevalin  No Known Allergies   Past Medical History:  Diagnosis Date  . Arthritis   . Cataract    bilateral  . Chicken pox   . Colon polyp   . Follicular lymphoma ([place]) 08/2016   lymph nodes   . Hyperlipidemia   . Osteoporosis      Past Surgical History:  Procedure Laterality Date  . AXILLARY LYMPH NODE DISSECTION Right 08/21/2016   Procedure: AXILLARY LYMPH NODE excision;  Surgeon: Leonie Green, MD;  Location: [facility] ORS;  Service: General;  Laterality: Right;  . CATARACT EXTRACTION, BILATERAL Bilateral   . JOINT REPLACEMENT Left   . TONSILLECTOMY    . TOTAL HIP ARTHROPLASTY Left 1992    Social History   Social History  . Marital status: Married    Spouse name: N/A  . Number of children: N/A  . Years of education: N/A   Occupational History  . Not on file.   Social History Main Topics  . Smoking status: Never Smoker  .  Smokeless tobacco: Never Used  . Alcohol use No  . Drug use: No  . Sexual activity: Not on file   Other Topics Concern  . Not on file   Social History Narrative  . No narrative on file    Family History  Problem Relation Age of Onset  . Diabetes Sister   . Lung cancer Brother   . Diabetes Brother   . Basal cell  carcinoma Daughter   . Breast cancer Paternal Aunt      Current Outpatient Prescriptions:  .  albuterol (PROVENTIL HFA;VENTOLIN HFA) 108 (90 Base) MCG/ACT inhaler, Inhale 2 puffs into the lungs every 6 (six) hours as needed for wheezing or shortness of breath., Disp: 1 Inhaler, Rfl: 2 .  alendronate (FOSAMAX) 70 MG tablet, Take 70 mg by mouth once a week. Take with a full glass of water on an empty stomach., Disp: , Rfl:  .  Cholecalciferol (VITAMIN D-3) 1000 units CAPS, Take 1,000 Units by mouth daily., Disp: , Rfl:  .  meloxicam (MOBIC) 15 MG tablet, Take 1 tablet by mouth daily., Disp: , Rfl:  .  Multiple Vitamins-Minerals (CENTRUM SILVER PO), Take 1 tablet by mouth daily., Disp: , Rfl:  .  ondansetron (ZOFRAN-ODT) 8 MG disintegrating tablet, Take 1 tablet (8 mg total) by mouth every 8 (eight) hours as needed for nausea or vomiting., Disp: 30 tablet, Rfl: 2 .  pantoprazole (PROTONIX) 20 MG tablet, Take 1 tablet by mouth daily., Disp: , Rfl:  .  simvastatin (ZOCOR) 20 MG tablet, Take 1 tablet by mouth at bedtime. , Disp: , Rfl:  .  tiZANidine (ZANAFLEX) 2 MG tablet, Take 1 tablet by mouth at bedtime as needed. , Disp: , Rfl:  .  traMADol-acetaminophen (ULTRACET) 37.5-325 MG tablet, Take 1 tablet by mouth every 8 (eight) hours as needed. , Disp: , Rfl:  .  fentaNYL (DURAGESIC - DOSED MCG/HR) 12 MCG/HR, Place 1 patch (12.5 mcg total) onto the skin every 3 (three) days., Disp: 5 patch, Rfl: 0 .  fentaNYL (DURAGESIC - DOSED MCG/HR) 12 MCG/HR, Place 1 patch (12.5 mcg total) onto the skin every 3 (three) days., Disp: 5 patch, Rfl: 0  Physical exam:  Vitals:   07/25/17 1459  BP: (!) 160/83  Pulse: (!) 102  Resp: 20  Temp: 97.7 F (36.5 C)  TempSrc: Tympanic  Weight: 122 lb 11.2 oz (55.7 kg)   Physical Exam  Constitutional: She is oriented to person, place, and time and well-developed, well-nourished, and in no distress. Vital signs are normal.  HENT:  Head: Normocephalic and atraumatic.    Eyes: Pupils are equal, round, and reactive to light. Conjunctivae and EOM are normal.  Neck: Normal range of motion and full passive range of motion without pain. Neck supple.  Cardiovascular: Normal rate, regular rhythm and normal heart sounds.   Pulmonary/Chest: Effort normal and breath sounds normal.  Abdominal: Soft. Bowel sounds are normal.  Musculoskeletal: Normal range of motion. She exhibits edema.  To right breast and chest  Lymphadenopathy:    She has axillary adenopathy.       Right axillary: Lateral adenopathy present.  Neurological: She is alert and oriented to person, place, and time. Gait normal.  Skin: Skin is warm and dry.     CMP Latest Ref Rng & Units 07/25/2017  Glucose 65 - 99 mg/dL 96  BUN 6 - 20 mg/dL 14  Creatinine 0.44 - 1.00 mg/dL 0.88  Sodium 135 - 145 mmol/L 138  Potassium 3.5 -  5.1 mmol/L 3.5  Chloride 101 - 111 mmol/L 101  CO2 22 - 32 mmol/L 25  Calcium 8.9 - 10.3 mg/dL 9.5  Total Protein 6.5 - 8.1 g/dL 6.2(L)  Total Bilirubin 0.3 - 1.2 mg/dL 0.4  Alkaline Phos 38 - 126 U/L 69  AST 15 - 41 U/L 29  ALT 14 - 54 U/L 16   CBC Latest Ref Rng & Units 07/25/2017  WBC 3.6 - 11.0 K/uL 6.6  Hemoglobin 12.0 - 16.0 g/dL 13.3  Hematocrit 35.0 - 47.0 % 38.3  Platelets 150 - 440 K/uL 253    No images are attached to the encounter.  No results found.   Assessment and plan- Patient is a 71 y.o. female presents with pain to her right back extending to right breast. On assessment, the patient noted to have moderate edema to right breast and chest as well as right axillary lymphadenopathy. She exhibits tenderness to right breast and right upper back and right axilla. No obvious deformities noted. No skin discoloration. She is afebrile. Vital signs are stable. Labs were drawn. LDH is elevated at 337. All other labs are within normal limits.  1. Pain: RX Fentanyl Patch 12.5 mcg/hr. Continue tramadol when necessary. Patient family were hesitant to begin a pain  medication regimen but are willing to give it a try given the increase in her pain level. Will be seen by Dr. Grayland Ormond next week to reevaluate pain. 2. Right Axillary lymphanopathy and right breast edema: Continue to monitor. Dr. Grayland Ormond informed. There is no additional need to reimage at this point. Began weekly Rituxan last week.  3. Cough: Allergies??? Suggested OTC Zyrtec or Allegra. 4. RTC on Tuesday to see Dr. Alexander Bergeron and Zevalin.    Visit Diagnosis 1. Neoplasm related pain   2. 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 She also understands that She can call clinic at any time with any questions, concerns, or complaints.    Marisue Humble [facility] at [facility] Pager- [phone] 07/26/2017 10:17 AM

## 2017-07-25 NOTE — Telephone Encounter (Signed)
Spoke with daughter and she will have patient come in at 2 for lab and 230 see NP.

## 2017-07-25 NOTE — Telephone Encounter (Signed)
Daughter called to report that patient is having a lot of pain to the point of her crying last night. Please advise of what can be done for her.  I have called daughter back to get more information regarding location, character, aggravating factors

## 2017-07-25 NOTE — Addendum Note (Signed)
Addended by: Betti Cruz on: 07/25/2017 01:34 PM   Modules accepted: Orders

## 2017-07-25 NOTE — Progress Notes (Signed)
Patient here today for acute add on visit regarding pain. Patient reports pain worsened last night.

## 2017-07-29 ENCOUNTER — Ambulatory Visit
Admission: RE | Admit: 2017-07-29 | Discharge: 2017-07-29 | Disposition: A | Discharge status: Home / Self Care | Payer: Medicare Other | Source: Ambulatory Visit | Attending: Oncology | Admitting: Oncology

## 2017-07-29 ENCOUNTER — Telehealth: Payer: Self-pay | Admitting: *Deleted

## 2017-07-29 DIAGNOSIS — R918 Other nonspecific abnormal finding of lung field: Secondary | ICD-10-CM | POA: Insufficient documentation

## 2017-07-29 DIAGNOSIS — I700 Atherosclerosis of aorta: Secondary | ICD-10-CM | POA: Insufficient documentation

## 2017-07-29 DIAGNOSIS — C8201 Follicular lymphoma grade I, lymph nodes of head, face, and neck: Secondary | ICD-10-CM

## 2017-07-29 DIAGNOSIS — J90 Pleural effusion, not elsewhere classified: Secondary | ICD-10-CM | POA: Insufficient documentation

## 2017-07-29 DIAGNOSIS — R05 Cough: Secondary | ICD-10-CM

## 2017-07-29 DIAGNOSIS — R059 Cough, unspecified: Secondary | ICD-10-CM

## 2017-07-29 MED ORDER — LEVOFLOXACIN 500 MG PO TABS: 500.0000 mg | ORAL_TABLET | Freq: Every day | ORAL | 0 refills | Status: DC

## 2017-07-29 NOTE — Progress Notes (Signed)
[name]  [phone] [phone]) [phone]  ID: Veronica Boyd OB: 16-Feb-1946  MR#: [id number]  UGQ#:[id number]  Patient Care Team: Glendon Axe, MD as PCP - General (Internal Medicine) Leonie Green, MD as Referring Physician (Surgery)  CHIEF COMPLAINT: Recurrent follicular lymphoma  INTERVAL HISTORY: Patient returns to clinic today for further evaluation and consideration of day 8 of Rituxan. Patient also has a plan to receive Zevalin today as well. not being ready for administration. She continues to have a chronic cough. Her right shoulder pain has not improved despite increasing narcotics recently. She is also complaining of increased shortness of breath. Her lymphadenopathy is unchanged. She has no neurologic complaints. She denies any fevers, night sweats, or weight loss. She has no chest pain or shortness of breath. She has a poor appetite, but denies any nausea, vomiting, constipation, or diarrhea. She has no urinary complaint. Patient offers no further specific complaints today.  REVIEW OF SYSTEMS:   Review of Systems  Constitutional: Negative.  Negative for fever, malaise/fatigue and weight loss.  Respiratory: Positive for cough and shortness of breath. Negative for hemoptysis.   Cardiovascular: Negative.  Negative for chest pain and leg swelling.  Gastrointestinal: Negative.  Negative for abdominal pain, blood in stool, melena, nausea and vomiting.  Genitourinary: Negative.   Musculoskeletal: Positive for joint pain.  Skin: Negative.  Negative for rash.  Neurological: Negative.  Negative for sensory change and weakness.  Psychiatric/Behavioral: Negative.  The patient is not nervous/anxious.    As per HPI. Otherwise, a complete review of systems is negative.   PAST MEDICAL HISTORY: Past Medical History:  Diagnosis Date  . Arthritis   . Cataract    bilateral  . Chicken pox   . Colon polyp   . Follicular lymphoma ([place]) 08/2016     lymph nodes   . Hyperlipidemia   . Osteoporosis     PAST SURGICAL HISTORY: Past Surgical History:  Procedure Laterality Date  . AXILLARY LYMPH NODE DISSECTION Right 08/21/2016   Procedure: AXILLARY LYMPH NODE excision;  Surgeon: Leonie Green, MD;  Location: [facility] ORS;  Service: General;  Laterality: Right;  . CATARACT EXTRACTION, BILATERAL Bilateral   . JOINT REPLACEMENT Left   . TONSILLECTOMY    . TOTAL HIP ARTHROPLASTY Left 1992    FAMILY HISTORY: Family History  Problem Relation Age of Onset  . Diabetes Sister   . Lung cancer Brother   . Diabetes Brother   . Basal cell carcinoma Daughter   . Breast cancer Paternal Aunt     ADVANCED DIRECTIVES (Y/N):  N  HEALTH MAINTENANCE: Social History  Substance Use Topics  . Smoking status: Never Smoker  . Smokeless tobacco: Never Used  . Alcohol use No     Colonoscopy:  PAP:  Bone density:  Lipid panel:  No Known Allergies  Current Outpatient Prescriptions  Medication Sig Dispense Refill  . albuterol (PROVENTIL HFA;VENTOLIN HFA) 108 (90 Base) MCG/ACT inhaler Inhale 2 puffs into the lungs every 6 (six) hours as needed for wheezing or shortness of breath. 1 Inhaler 2  . alendronate (FOSAMAX) 70 MG tablet Take 70 mg by mouth once a week. Take with a full glass of water on an empty stomach.    . Cholecalciferol (VITAMIN D-3) 1000 units CAPS Take 1,000 Units by mouth daily.    . fentaNYL (DURAGESIC - DOSED MCG/HR) 12 MCG/HR Place 1 patch (12.5 mcg total) onto the skin every 3 (three) days. 5 patch 0  . levofloxacin (LEVAQUIN)  500 MG tablet Take 1 tablet (500 mg total) by mouth daily. 7 tablet 0  . meloxicam (MOBIC) 15 MG tablet Take 1 tablet by mouth daily.    . Multiple Vitamins-Minerals (CENTRUM SILVER PO) Take 1 tablet by mouth daily.    . ondansetron (ZOFRAN-ODT) 8 MG disintegrating tablet Take 1 tablet (8 mg total) by mouth every 8 (eight) hours as needed for nausea or vomiting. 30 tablet 2  . pantoprazole  (PROTONIX) 20 MG tablet Take 1 tablet by mouth daily.    . simvastatin (ZOCOR) 20 MG tablet Take 1 tablet by mouth at bedtime.     Marland Kitchen tiZANidine (ZANAFLEX) 2 MG tablet Take 1 tablet by mouth at bedtime as needed.     . traMADol-acetaminophen (ULTRACET) 37.5-325 MG tablet Take 1 tablet by mouth every 8 (eight) hours as needed.     . chlorpheniramine-HYDROcodone (TUSSIONEX) 10-8 MG/5ML SUER Take 5 mLs by mouth every 12 (twelve) hours as needed for cough. 140 mL 0   No current facility-administered medications for this visit.    Facility-Administered Medications Ordered in Other Visits  Medication Dose Route Frequency Provider Last Rate Last Dose  . yttrium-90 injection 38.9 millicurie  37.3 millicurie Intravenous Once Chrystal, Eulas Post, MD        OBJECTIVE: Vitals:   07/30/17 0840  BP: 139/84  Pulse: (!) 102  Resp: 20  Temp: 99.4 F (37.4 C)  SpO2: 94%     Body mass index is 23.69 kg/m.    ECOG FS:0 - Asymptomatic  General: Well-developed, well-nourished, no acute distress. Eyes: Pink conjunctiva, anicteric sclera. Lungs: Clear to auscultation bilaterally. Heart: Regular rate and rhythm. No rubs, murmurs, or gallops. Abdomen: Soft, nontender, nondistended. No organomegaly noted, normoactive bowel sounds. Musculoskeletal: No edema, cyanosis, or clubbing. Neuro: Alert, answering all questions appropriately. Cranial nerves grossly intact. Skin: No rashes or petechiae noted. Psych: Normal affect. Lymphatics: Easily palpable lymphadenopathy in right axilla. Fullness noted surrounding right clavicle. New increasing lymphadenopathy in left cervical chain.  LAB RESULTS:  Lab Results  Component Value Date   NA 136 07/30/2017   K 3.2 (L) 07/30/2017   CL 97 (L) 07/30/2017   CO2 28 07/30/2017   GLUCOSE 104 (H) 07/30/2017   BUN 8 07/30/2017   CREATININE 0.72 07/30/2017   CALCIUM 9.9 07/30/2017   PROT 6.3 (L) 07/30/2017   ALBUMIN 3.4 (L) 07/30/2017   AST 26 07/30/2017   ALT 14  07/30/2017   ALKPHOS 68 07/30/2017   BILITOT 0.6 07/30/2017   GFRNONAA >60 07/30/2017   GFRAA >60 07/30/2017    Lab Results  Component Value Date   WBC 5.5 07/30/2017   NEUTROABS 3.2 07/30/2017   HGB 14.2 07/30/2017   HCT 40.7 07/30/2017   MCV 89.3 07/30/2017   PLT 228 07/30/2017     STUDIES: Dg Chest 2 View  Result Date: 07/29/2017 CLINICAL DATA:  Worsening cough EXAM: CHEST  2 VIEW COMPARISON:  CT 06/11/2017, PET-CT 04/19/2017 FINDINGS: Small bilateral pleural effusions. Interval consolidation of right middle lobe and right lung base. Oval opacity in the right upper lobe somewhat nodular in configuration. Partial consolidation of the right upper lobe with increased right paratracheal opacity. Heart size is normal. Aortic atherosclerosis. No pneumothorax. IMPRESSION: 1. Small bilateral right greater than left pleural effusions 2. Multifocal consolidations in the right middle lobe, right lung base, and right upper lobe which may reflect multifocal pneumonia. Increased right paratracheal opacity, could reflect atelectasis, pneumonia, or adenopathy. Airspace focus in the right upper lobe is  somewhat nodular in appearance. CT could be helpful for further evaluation. Electronically Signed   By: Donavan Foil M.D.   On: 07/29/2017 15:51    ASSESSMENT: Recurrent follicular lymphoma, grade 1-2, Ki-67 75%.  PLAN:    1. Recurrent follicular lymphoma: CT scan results from June 11, 2017 reviewed independently with continued progression of disease. She completed XRT to her painful right lymph node. Patient received 4 cycles of single agent rituximab completing on October 02, 2016. Proceed with 250 mg/m of Rituxan today and then  Zevalin dosing 4 hours after her infusion . Will have to monitor for severe and prolonged cytopenias as well as mucocutaneous reactions.  Return to clinic in 1 week for repeat laboratory work and further evaluation. 2. Hyperglycemia: Improved. 3. Shoulder pain: Unclear  etiology, possibly related to underlying lymphoma. If does not improve with treatment will consider MRI in the future.  4. Shortness of breath/cough: Chest x-ray results as above. Patient was initiated on Levaquin. Continue albuterol inhaler as needed.  Approximately 30 minutes was spent in discussion of which greater than 50% was consultation.  Patient expressed understanding and was in agreement with this plan. She also understands that She can call clinic at any time with any questions, concerns, or complaints.   Cancer Staging Follicular lymphoma ([place]) Staging form: Lymphoid Neoplasms, AJCC 6th Edition - Clinical stage from 08/27/2016: Stage IIE (lymphoma only) - Signed by Lloyd Huger, MD on 08/27/2016   Lloyd Huger, MD   08/02/2017 1:25 PM

## 2017-07-29 NOTE — Telephone Encounter (Signed)
Per VO Fin[place]gan get CXR today or tomorrow before clinic Veronica Boyd agrees to bring her today for CXR

## 2017-07-29 NOTE — Telephone Encounter (Signed)
Daughter called to ask what else can be done for her mothers cough, it is worse and her chest is getting sore form coughing. They have tried cough syrups, cough drops, inhalers, and allergy medications and nothing is helping. Please advse

## 2017-07-29 NOTE — Telephone Encounter (Signed)
Per VO Dr Grayland Ormond, Levaquin 50 mg every day times 7 days. Informed Shirlean Mylar and she confirmed [facility]

## 2017-07-30 ENCOUNTER — Inpatient Hospital Stay (HOSPITAL_BASED_OUTPATIENT_CLINIC_OR_DEPARTMENT_OTHER): Payer: Medicare Other | Admitting: Oncology

## 2017-07-30 ENCOUNTER — Inpatient Hospital Stay: Payer: Medicare Other

## 2017-07-30 ENCOUNTER — Ambulatory Visit
Admission: RE | Admit: 2017-07-30 | Discharge: 2017-07-30 | Disposition: A | Discharge status: Home / Self Care | Payer: Medicare Other | Source: Ambulatory Visit | Attending: Radiation Oncology | Admitting: Radiation Oncology

## 2017-07-30 VITALS — BP 139/84 | HR 102 | Temp 99.4°F | Resp 20 | Wt 117.3 lb

## 2017-07-30 DIAGNOSIS — N63 Unspecified lump in unspecified breast: Secondary | ICD-10-CM

## 2017-07-30 DIAGNOSIS — R0602 Shortness of breath: Secondary | ICD-10-CM

## 2017-07-30 DIAGNOSIS — M810 Age-related osteoporosis without current pathological fracture: Secondary | ICD-10-CM

## 2017-07-30 DIAGNOSIS — E875 Hyperkalemia: Secondary | ICD-10-CM

## 2017-07-30 DIAGNOSIS — M25511 Pain in right shoulder: Secondary | ICD-10-CM | POA: Diagnosis not present

## 2017-07-30 DIAGNOSIS — G893 Neoplasm related pain (acute) (chronic): Secondary | ICD-10-CM

## 2017-07-30 DIAGNOSIS — Z923 Personal history of irradiation: Secondary | ICD-10-CM

## 2017-07-30 DIAGNOSIS — Z803 Family history of malignant neoplasm of breast: Secondary | ICD-10-CM

## 2017-07-30 DIAGNOSIS — Z86010 Personal history of colonic polyps: Secondary | ICD-10-CM

## 2017-07-30 DIAGNOSIS — Z79899 Other long term (current) drug therapy: Secondary | ICD-10-CM

## 2017-07-30 DIAGNOSIS — C8298 Follicular lymphoma, unspecified, lymph nodes of multiple sites: Secondary | ICD-10-CM | POA: Insufficient documentation

## 2017-07-30 DIAGNOSIS — R05 Cough: Secondary | ICD-10-CM

## 2017-07-30 DIAGNOSIS — Z808 Family history of malignant neoplasm of other organs or systems: Secondary | ICD-10-CM

## 2017-07-30 DIAGNOSIS — M7989 Other specified soft tissue disorders: Secondary | ICD-10-CM

## 2017-07-30 DIAGNOSIS — Z8619 Personal history of other infectious and parasitic diseases: Secondary | ICD-10-CM

## 2017-07-30 DIAGNOSIS — R739 Hyperglycemia, unspecified: Secondary | ICD-10-CM | POA: Diagnosis not present

## 2017-07-30 DIAGNOSIS — C8208 Follicular lymphoma grade I, lymph nodes of multiple sites: Secondary | ICD-10-CM | POA: Diagnosis not present

## 2017-07-30 DIAGNOSIS — Z801 Family history of malignant neoplasm of trachea, bronchus and lung: Secondary | ICD-10-CM

## 2017-07-30 DIAGNOSIS — N644 Mastodynia: Secondary | ICD-10-CM | POA: Diagnosis not present

## 2017-07-30 LAB — COMPREHENSIVE METABOLIC PANEL
ALT: 14 U/L (ref 14–54)
AST: 26 U/L (ref 15–41)
Albumin: 3.4 g/dL — ABNORMAL LOW (ref 3.5–5.0)
Alkaline Phosphatase: 68 U/L (ref 38–126)
Anion gap: 11 (ref 5–15)
BUN: 8 mg/dL (ref 6–20)
CHLORIDE: 97 mmol/L — AB (ref 101–111)
CO2: 28 mmol/L (ref 22–32)
CREATININE: 0.72 mg/dL (ref 0.44–1.00)
Calcium: 9.9 mg/dL (ref 8.9–10.3)
Glucose, Bld: 104 mg/dL — ABNORMAL HIGH (ref 65–99)
POTASSIUM: 3.2 mmol/L — AB (ref 3.5–5.1)
SODIUM: 136 mmol/L (ref 135–145)
Total Bilirubin: 0.6 mg/dL (ref 0.3–1.2)
Total Protein: 6.3 g/dL — ABNORMAL LOW (ref 6.5–8.1)

## 2017-07-30 LAB — CBC WITH DIFFERENTIAL/PLATELET
BASOS ABS: 0 10*3/uL (ref 0–0.1)
Basophils Relative: 1 %
EOS ABS: 0.2 10*3/uL (ref 0–0.7)
EOS PCT: 4 %
HCT: 40.7 % (ref 35.0–47.0)
Hemoglobin: 14.2 g/dL (ref 12.0–16.0)
Lymphocytes Relative: 16 %
Lymphs Abs: 0.9 10*3/uL — ABNORMAL LOW (ref 1.0–3.6)
MCH: 31.2 pg (ref 26.0–34.0)
MCHC: 34.9 g/dL (ref 32.0–36.0)
MCV: 89.3 fL (ref 80.0–100.0)
MONO ABS: 1.1 10*3/uL — AB (ref 0.2–0.9)
Monocytes Relative: 20 %
Neutro Abs: 3.2 10*3/uL (ref 1.4–6.5)
Neutrophils Relative %: 59 %
PLATELETS: 228 10*3/uL (ref 150–440)
RBC: 4.55 MIL/uL (ref 3.80–5.20)
RDW: 13 % (ref 11.5–14.5)
WBC: 5.5 10*3/uL (ref 3.6–11.0)

## 2017-07-30 MED ORDER — HYDROCOD POLST-CPM POLST ER 10-8 MG/5ML PO SUER: 5.0000 mL | Freq: Two times a day (BID) | ORAL | 0 refills | Status: DC | PRN

## 2017-07-30 MED ORDER — DIPHENHYDRAMINE HCL 50 MG/ML IJ SOLN
50.0000 mg | Freq: Once | INTRAMUSCULAR | Status: AC
  Administered 2017-07-30: 50 mg via INTRAVENOUS

## 2017-07-30 MED ORDER — DIPHENHYDRAMINE HCL 50 MG/ML IJ SOLN
INTRAMUSCULAR | Status: AC
  Filled 2017-07-30: qty 1

## 2017-07-30 MED ORDER — SODIUM CHLORIDE 0.9 % IV SOLN
250.0000 mg/m2 | Freq: Once | INTRAVENOUS | Status: AC
  Administered 2017-07-30: 400 mg via INTRAVENOUS
  Filled 2017-07-30: qty 40

## 2017-07-30 MED ORDER — ACETAMINOPHEN 325 MG PO TABS
650.0000 mg | ORAL_TABLET | Freq: Once | ORAL | Status: AC
  Administered 2017-07-30: 650 mg via ORAL
  Filled 2017-07-30: qty 2

## 2017-07-30 MED ORDER — SODIUM CHLORIDE 0.9 % IV SOLN
Freq: Once | INTRAVENOUS | Status: AC
  Administered 2017-07-30: 10:00:00 via INTRAVENOUS
  Filled 2017-07-30: qty 1000

## 2017-07-30 MED ORDER — SODIUM CHLORIDE 0.9 % IV SOLN
Freq: Once | INTRAVENOUS | Status: AC
  Administered 2017-07-30: 10:00:00 via INTRAVENOUS
  Filled 2017-07-30: qty 4

## 2017-07-30 NOTE — Progress Notes (Signed)
Patient here today for follow up and treatment consideration regarding lymphoma. Patient reports continued cough, had chest xray yesterday. Patient was started on Levaquin per orders from Dr. Grayland Ormond, patient has taken 2 doses at this point. Patient also reports continued right shoulder pain, states Fentanyl patch has not improved pain.

## 2017-07-31 MED ORDER — YTTRIUM 90 INJECTION: 22.3000 | INJECTION | Freq: Once | INTRAVENOUS | Status: AC

## 2017-08-04 NOTE — Progress Notes (Signed)
[place]  [phone] [phone]) [phone]  ID: [name] OB: 1946/09/25  MR#: [id number]  VOZ#:[id number]  Patient Care Team: Glendon Axe, MD as PCP - General (Internal Medicine) Leonie Green, MD as Referring Physician (Surgery)  CHIEF COMPLAINT: Recurrent follicular lymphoma  INTERVAL HISTORY: Patient returns to clinic today for further evaluation and to assess her toleration of Zevalin. She continues to have a chronic cough. Her right shoulder pain has not improved despite increasing narcotics recently. She is also complaining of increased shortness of breath. Her lymphadenopathy is unchanged. She has no neurologic complaints. She denies any fevers, night sweats, or weight loss. She has no chest pain or shortness of breath. She has a poor appetite, but denies any nausea, vomiting, constipation, or diarrhea. She has no urinary complaint. Patient offers no further specific complaints today.  REVIEW OF SYSTEMS:   Review of Systems  Constitutional: Negative.  Negative for fever, malaise/fatigue and weight loss.  Respiratory: Positive for cough and shortness of breath. Negative for hemoptysis.   Cardiovascular: Negative.  Negative for chest pain and leg swelling.  Gastrointestinal: Negative.  Negative for abdominal pain, blood in stool, melena, nausea and vomiting.  Genitourinary: Negative.   Musculoskeletal: Positive for joint pain.  Skin: Negative.  Negative for rash.  Neurological: Negative.  Negative for sensory change and weakness.  Psychiatric/Behavioral: The patient has insomnia. The patient is not nervous/anxious.    As per HPI. Otherwise, a complete review of systems is negative.   PAST MEDICAL HISTORY: Past Medical History:  Diagnosis Date  . Arthritis   . Cataract    bilateral  . Chicken pox   . Colon polyp   . Follicular lymphoma ([place]) 08/2016   lymph nodes   . Hyperlipidemia   . Osteoporosis     PAST SURGICAL  HISTORY: Past Surgical History:  Procedure Laterality Date  . AXILLARY LYMPH NODE DISSECTION Right 08/21/2016   Procedure: AXILLARY LYMPH NODE excision;  Surgeon: Leonie Green, MD;  Location: [facility] ORS;  Service: General;  Laterality: Right;  . CATARACT EXTRACTION, BILATERAL Bilateral   . JOINT REPLACEMENT Left   . TONSILLECTOMY    . TOTAL HIP ARTHROPLASTY Left 1992    FAMILY HISTORY: Family History  Problem Relation Age of Onset  . Diabetes Sister   . Lung cancer Brother   . Diabetes Brother   . Basal cell carcinoma Daughter   . Breast cancer Paternal Aunt     ADVANCED DIRECTIVES (Y/N):  N  HEALTH MAINTENANCE: Social History  Substance Use Topics  . Smoking status: Never Smoker  . Smokeless tobacco: Never Used  . Alcohol use No     Colonoscopy:  PAP:  Bone density:  Lipid panel:  No Known Allergies  Current Outpatient Prescriptions  Medication Sig Dispense Refill  . albuterol (PROVENTIL HFA;VENTOLIN HFA) 108 (90 Base) MCG/ACT inhaler Inhale 2 puffs into the lungs every 6 (six) hours as needed for wheezing or shortness of breath. 1 Inhaler 2  . alendronate (FOSAMAX) 70 MG tablet Take 70 mg by mouth once a week. Take with a full glass of water on an empty stomach.    . chlorpheniramine-HYDROcodone (TUSSIONEX) 10-8 MG/5ML SUER Take 5 mLs by mouth every 12 (twelve) hours as needed for cough. 140 mL 0  . Cholecalciferol (VITAMIN D-3) 1000 units CAPS Take 1,000 Units by mouth daily.    . fentaNYL (DURAGESIC - DOSED MCG/HR) 12 MCG/HR Place 1 patch (12.5 mcg total) onto the skin every 3 (three)  days. 5 patch 0  . meloxicam (MOBIC) 15 MG tablet Take 1 tablet by mouth daily.    . Multiple Vitamins-Minerals (CENTRUM SILVER PO) Take 1 tablet by mouth daily.    . ondansetron (ZOFRAN-ODT) 8 MG disintegrating tablet Take 1 tablet (8 mg total) by mouth every 8 (eight) hours as needed for nausea or vomiting. 30 tablet 2  . pantoprazole (PROTONIX) 20 MG tablet Take 1 tablet by  mouth daily.    . simvastatin (ZOCOR) 20 MG tablet Take 1 tablet by mouth at bedtime.     Marland Kitchen tiZANidine (ZANAFLEX) 2 MG tablet Take 1 tablet by mouth at bedtime as needed.     . traMADol-acetaminophen (ULTRACET) 37.5-325 MG tablet Take 1 tablet by mouth every 8 (eight) hours as needed.     Marland Kitchen levofloxacin (LEVAQUIN) 500 MG tablet Take 1 tablet (500 mg total) by mouth daily. (Patient not taking: Reported on 08/06/2017) 7 tablet 0   No current facility-administered medications for this visit.    Facility-Administered Medications Ordered in Other Visits  Medication Dose Route Frequency Provider Last Rate Last Dose  . yttrium-90 injection 49.7 millicurie  53.0 millicurie Intravenous Once Chrystal, Eulas Post, MD        OBJECTIVE: Vitals:   08/06/17 1442  BP: 122/81  Pulse: (!) 105  Resp: 18  Temp: (!) 97.5 F (36.4 C)     Body mass index is 23.19 kg/m.    ECOG FS:0 - Asymptomatic  General: Well-developed, well-nourished, no acute distress. Eyes: Pink conjunctiva, anicteric sclera. Lungs: Clear to auscultation bilaterally. Heart: Regular rate and rhythm. No rubs, murmurs, or gallops. Abdomen: Soft, nontender, nondistended. No organomegaly noted, normoactive bowel sounds. Musculoskeletal: No edema, cyanosis, or clubbing. Neuro: Alert, answering all questions appropriately. Cranial nerves grossly intact. Skin: No rashes or petechiae noted. Psych: Normal affect. Lymphatics: Easily palpable lymphadenopathy in right axilla. Fullness noted surrounding right clavicle, mildly improved.   LAB RESULTS:  Lab Results  Component Value Date   NA 132 (L) 08/06/2017   K 2.9 (L) 08/06/2017   CL 91 (L) 08/06/2017   CO2 31 08/06/2017   GLUCOSE 112 (H) 08/06/2017   BUN 9 08/06/2017   CREATININE 0.68 08/06/2017   CALCIUM 9.5 08/06/2017   PROT 6.7 08/06/2017   ALBUMIN 3.6 08/06/2017   AST 25 08/06/2017   ALT 13 (L) 08/06/2017   ALKPHOS 66 08/06/2017   BILITOT 0.8 08/06/2017   GFRNONAA >60  08/06/2017   GFRAA >60 08/06/2017    Lab Results  Component Value Date   WBC 4.6 08/06/2017   NEUTROABS 2.9 08/06/2017   HGB 14.7 08/06/2017   HCT 42.4 08/06/2017   MCV 89.6 08/06/2017   PLT 287 08/06/2017     STUDIES: Dg Chest 2 View  Result Date: 07/29/2017 CLINICAL DATA:  Worsening cough EXAM: CHEST  2 VIEW COMPARISON:  CT 06/11/2017, PET-CT 04/19/2017 FINDINGS: Small bilateral pleural effusions. Interval consolidation of right middle lobe and right lung base. Oval opacity in the right upper lobe somewhat nodular in configuration. Partial consolidation of the right upper lobe with increased right paratracheal opacity. Heart size is normal. Aortic atherosclerosis. No pneumothorax. IMPRESSION: 1. Small bilateral right greater than left pleural effusions 2. Multifocal consolidations in the right middle lobe, right lung base, and right upper lobe which may reflect multifocal pneumonia. Increased right paratracheal opacity, could reflect atelectasis, pneumonia, or adenopathy. Airspace focus in the right upper lobe is somewhat nodular in appearance. CT could be helpful for further evaluation. Electronically Signed  By: Donavan Foil M.D.   On: 07/29/2017 15:51   Ct Chest W Contrast  Result Date: 08/06/2017 CLINICAL DATA:  Recurrent follicular lymphoma. Persistent cough and right chest and back pain. EXAM: CT CHEST WITH CONTRAST TECHNIQUE: Multidetector CT imaging of the chest was performed during intravenous contrast administration. CONTRAST:  82m ISOVUE-300 IOPAMIDOL (ISOVUE-300) INJECTION 61% COMPARISON:  07/29/2017 chest radiograph.  06/11/2017 chest CT. FINDINGS: Cardiovascular: Normal heart size. No significant pericardial fluid/thickening. Left main and left anterior descending coronary atherosclerosis. Atherosclerotic nonaneurysmal thoracic aorta. Normal caliber pulmonary arteries. No central pulmonary emboli. Mediastinum/Nodes: No discrete thyroid nodules. Unremarkable esophagus. Bulky  centrally necrotic 4.1 cm dominant right axillary node (series 2/image 28), previously 4.5 cm on 06/11/2017, mildly decreased. There is marked progression of an infiltrative heterogeneous soft tissue mass throughout the right ventral chest wall, with invasion of the right pectoral muscles, encasement of the anterior right first through sixth ribs, encasement of the right internal mammary vessels and involvement of the anterior right pleural space, measuring up to 4.0 cm thickness (series 2/ image 74), significantly increased from 0.9 cm. Infiltrative left supraclavicular adenopathy measuring up to 2.9 cm (series 2/ image 12), decreased from 3.7 cm. Infiltrative left retropectoral adenopathy measuring up to 2.1 cm (series 2/image 29), previously 2.4 cm, mildly decreased. Some of the mildly enlarged left axillary nodes are mildly increased, largest 1.1 cm (series 2/image 54), previously 0.9 cm. New bulky infiltrative right prevascular mediastinal adenopathy measuring up to 2.1 cm (series 2/image 53). New infiltrative bulky right paratracheal adenopathy measuring up to 3.2 cm (series 2/image 43). Infiltrative 1.8 cm left prevascular mediastinal node (series 2/image 43), previously 1.6 cm, mildly increased. New left internal mammary adenopathy measuring up to 2.0 cm (series 2/image 49). New right pericardial phrenic adenopathy measuring up to 1.7 cm (series 2/image 106). New right T5-T7 paraspinal posterior mediastinal adenopathy measuring up to 1.5 cm at the T6 level (series 2/image 49). Lungs/Pleura: No pneumothorax. Moderate dependent right pleural effusion is new. No left pleural effusion. Moderate compressive atelectasis in the dependent right lower lobe. New sharply marginated bandlike consolidation in the anterior right upper lobe compatible with postradiation change. New 6 mm sub solid posterior left upper lobe nodule (series 3/ image 34). Upper abdomen: Unremarkable. Musculoskeletal: No aggressive appearing  focal osseous lesions. Moderate thoracic spondylosis. New asymmetric lobular heterogeneously enhancing soft tissue throughout the right breast parenchyma. Asymmetric new skin thickening throughout the right breast. IMPRESSION: 1. Marked progression of infiltrative lymphoma throughout the ventral right chest wall with new widespread involvement of the right breast parenchyma and right pectoral musculature and encasement of the right anterior first through sixth ribs with invasion of the anterior right pleural space. 2. Marked progression of infiltrative lymphoma throughout the right greater than left mediastinum including bilateral prevascular, right paratracheal and right paraspinal regions. 3. Necrotic dominant right axillary node is mildly decreased in size. Infiltrative left supraclavicular and left retropectoral adenopathy is mildly decreased. 4. 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 New nonspecific subcentimeter subsolid left upper lobe pulmonary nodule, for which attention on follow-up chest CT is advised in 3-6 months. 5. New moderate dependent right pleural effusion. 6. Left main and 1 vessel coronary atherosclerosis. Aortic Atherosclerosis (ICD10-I70.0). These results will be called to the ordering clinician or representative by the Radiology Department at the imaging location. Electronically Signed   By: JIlona SorrelM.D.   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  PLAN:  1. Recurrent follicular lymphoma: CT scan results from August 06, 2017 reviewed independently and reported as above with continued progression of disease. She completed XRT to her painful right lymph node. Patient only received 371 dose 1 week ago and there may not have been time for inadequate response. Will have to monitor for severe and prolonged cytopenias as well as mucocutaneous reactions.  Return to clinic in 1 week for repeat laboratory work and further  evaluation. 2. Hyperglycemia: Improved. 3. Shoulder pain: CT scan results as above. Likely secondary to progression of disease. Continue to monitor. 4. Shortness of breath/cough: CT scan results as above.   Approximately 30 minutes was spent in discussion of which greater than 50% was consultation.  Patient expressed understanding and was in agreement with this plan. She also understands that She can call clinic at any time with any questions, concerns, or complaints.   Cancer Staging Follicular lymphoma ([place]) Staging form: Lymphoid Neoplasms, AJCC 6th Edition - Clinical stage from 08/27/2016: Stage IIE (lymphoma only) - Signed by Lloyd Huger, MD on 08/27/2016   Lloyd Huger, MD   08/07/2017 8:35 AM

## 2017-08-06 ENCOUNTER — Inpatient Hospital Stay: Payer: Medicare Other | Attending: Oncology

## 2017-08-06 ENCOUNTER — Inpatient Hospital Stay (HOSPITAL_BASED_OUTPATIENT_CLINIC_OR_DEPARTMENT_OTHER): Payer: Medicare Other | Admitting: Oncology

## 2017-08-06 ENCOUNTER — Ambulatory Visit
Admission: RE | Admit: 2017-08-06 | Discharge: 2017-08-06 | Disposition: A | Discharge status: Home / Self Care | Payer: Medicare Other | Source: Ambulatory Visit | Attending: Oncology | Admitting: Oncology

## 2017-08-06 VITALS — BP 122/81 | HR 105 | Temp 97.5°F | Resp 18 | Wt 114.8 lb

## 2017-08-06 DIAGNOSIS — I700 Atherosclerosis of aorta: Secondary | ICD-10-CM | POA: Insufficient documentation

## 2017-08-06 DIAGNOSIS — R590 Localized enlarged lymph nodes: Secondary | ICD-10-CM | POA: Diagnosis not present

## 2017-08-06 DIAGNOSIS — M25511 Pain in right shoulder: Secondary | ICD-10-CM | POA: Diagnosis not present

## 2017-08-06 DIAGNOSIS — R059 Cough, unspecified: Secondary | ICD-10-CM

## 2017-08-06 DIAGNOSIS — J700 Acute pulmonary manifestations due to radiation: Secondary | ICD-10-CM | POA: Diagnosis not present

## 2017-08-06 DIAGNOSIS — J90 Pleural effusion, not elsewhere classified: Secondary | ICD-10-CM | POA: Insufficient documentation

## 2017-08-06 DIAGNOSIS — M810 Age-related osteoporosis without current pathological fracture: Secondary | ICD-10-CM | POA: Insufficient documentation

## 2017-08-06 DIAGNOSIS — Y842 Radiological procedure and radiotherapy as the cause of abnormal reaction of the patient, or of later complication, without mention of misadventure at the time of the procedure: Secondary | ICD-10-CM | POA: Insufficient documentation

## 2017-08-06 DIAGNOSIS — Z86010 Personal history of colonic polyps: Secondary | ICD-10-CM | POA: Diagnosis not present

## 2017-08-06 DIAGNOSIS — R05 Cough: Secondary | ICD-10-CM

## 2017-08-06 DIAGNOSIS — E785 Hyperlipidemia, unspecified: Secondary | ICD-10-CM | POA: Insufficient documentation

## 2017-08-06 DIAGNOSIS — Z801 Family history of malignant neoplasm of trachea, bronchus and lung: Secondary | ICD-10-CM | POA: Insufficient documentation

## 2017-08-06 DIAGNOSIS — C8208 Follicular lymphoma grade I, lymph nodes of multiple sites: Secondary | ICD-10-CM | POA: Diagnosis not present

## 2017-08-06 DIAGNOSIS — C8290 Follicular lymphoma, unspecified, unspecified site: Secondary | ICD-10-CM | POA: Insufficient documentation

## 2017-08-06 DIAGNOSIS — R911 Solitary pulmonary nodule: Secondary | ICD-10-CM | POA: Insufficient documentation

## 2017-08-06 DIAGNOSIS — N6489 Other specified disorders of breast: Secondary | ICD-10-CM

## 2017-08-06 DIAGNOSIS — I2510 Atherosclerotic heart disease of native coronary artery without angina pectoris: Secondary | ICD-10-CM | POA: Insufficient documentation

## 2017-08-06 DIAGNOSIS — Z803 Family history of malignant neoplasm of breast: Secondary | ICD-10-CM | POA: Diagnosis not present

## 2017-08-06 DIAGNOSIS — R739 Hyperglycemia, unspecified: Secondary | ICD-10-CM | POA: Insufficient documentation

## 2017-08-06 DIAGNOSIS — C8298 Follicular lymphoma, unspecified, lymph nodes of multiple sites: Secondary | ICD-10-CM

## 2017-08-06 LAB — COMPREHENSIVE METABOLIC PANEL
ALK PHOS: 66 U/L (ref 38–126)
ALT: 13 U/L — ABNORMAL LOW (ref 14–54)
ANION GAP: 10 (ref 5–15)
AST: 25 U/L (ref 15–41)
Albumin: 3.6 g/dL (ref 3.5–5.0)
BILIRUBIN TOTAL: 0.8 mg/dL (ref 0.3–1.2)
BUN: 9 mg/dL (ref 6–20)
CALCIUM: 9.5 mg/dL (ref 8.9–10.3)
CO2: 31 mmol/L (ref 22–32)
Chloride: 91 mmol/L — ABNORMAL LOW (ref 101–111)
Creatinine, Ser: 0.68 mg/dL (ref 0.44–1.00)
Glucose, Bld: 112 mg/dL — ABNORMAL HIGH (ref 65–99)
POTASSIUM: 2.9 mmol/L — AB (ref 3.5–5.1)
Sodium: 132 mmol/L — ABNORMAL LOW (ref 135–145)
TOTAL PROTEIN: 6.7 g/dL (ref 6.5–8.1)

## 2017-08-06 LAB — CBC WITH DIFFERENTIAL/PLATELET
BASOS PCT: 1 %
Basophils Absolute: 0 10*3/uL (ref 0–0.1)
Eosinophils Absolute: 0.3 10*3/uL (ref 0–0.7)
Eosinophils Relative: 6 %
HEMATOCRIT: 42.4 % (ref 35.0–47.0)
HEMOGLOBIN: 14.7 g/dL (ref 12.0–16.0)
LYMPHS ABS: 0.5 10*3/uL — AB (ref 1.0–3.6)
LYMPHS PCT: 12 %
MCH: 31.2 pg (ref 26.0–34.0)
MCHC: 34.8 g/dL (ref 32.0–36.0)
MCV: 89.6 fL (ref 80.0–100.0)
MONO ABS: 0.8 10*3/uL (ref 0.2–0.9)
MONOS PCT: 18 %
NEUTROS ABS: 2.9 10*3/uL (ref 1.4–6.5)
Neutrophils Relative %: 63 %
Platelets: 287 10*3/uL (ref 150–440)
RBC: 4.73 MIL/uL (ref 3.80–5.20)
RDW: 13.2 % (ref 11.5–14.5)
WBC: 4.6 10*3/uL (ref 3.6–11.0)

## 2017-08-06 LAB — LACTATE DEHYDROGENASE: LDH: 253 U/L — AB (ref 98–192)

## 2017-08-06 MED ORDER — IOPAMIDOL (ISOVUE-300) INJECTION 61%
75.0000 mL | Freq: Once | INTRAVENOUS | Status: AC | PRN
  Administered 2017-08-06: 75 mL via INTRAVENOUS

## 2017-08-06 NOTE — Progress Notes (Signed)
Patient here today for follow up regarding lymphoma. Patient reports she is till coughing. Patient also reports pain to her chest and back on the right side, having tenderness in her right ankle.

## 2017-08-07 ENCOUNTER — Other Ambulatory Visit: Payer: Self-pay | Admitting: *Deleted

## 2017-08-07 ENCOUNTER — Telehealth: Payer: Self-pay | Admitting: *Deleted

## 2017-08-07 MED ORDER — PREDNISONE 10 MG (21) PO TBPK: ORAL_TABLET | ORAL | 0 refills | Status: DC

## 2017-08-07 MED ORDER — HYDROCOD POLST-CPM POLST ER 10-8 MG/5ML PO SUER: 5.0000 mL | Freq: Two times a day (BID) | ORAL | 0 refills | Status: DC | PRN

## 2017-08-07 NOTE — Telephone Encounter (Signed)
Per Dr. Grayland Ormond prednisone taper sent to patients pharmacy.

## 2017-08-07 NOTE — Telephone Encounter (Signed)
Robin informed and then she asked for a refill of Tussionex. Prescription refilled and she will have her aunt come pick it up

## 2017-08-07 NOTE — Telephone Encounter (Signed)
What can be done for her cough with regards to results of CT  IMPRESSION: 1. Marked progression of infiltrative lymphoma throughout the ventral right chest wall with new widespread involvement of the right breast parenchyma and right pectoral musculature and encasement of the right anterior first through sixth ribs with invasion of the anterior right pleural space. 2. Marked progression of infiltrative lymphoma throughout the right greater than left mediastinum including bilateral prevascular, right paratracheal and right paraspinal regions. 3. Necrotic dominant right axillary node is mildly decreased in size. Infiltrative left supraclavicular and left retropectoral adenopathy is mildly decreased. 4. 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 New nonspecific subcentimeter subsolid left upper lobe pulmonary nodule, for which attention on follow-up chest CT is advised in 3-6 months. 5. New moderate dependent right pleural effusion. 6. Left main and 1 vessel coronary atherosclerosis.  Aortic Atherosclerosis (ICD10-I70.0).  These results will be called to the ordering clinician or representative by the Radiology Department at the imaging location.   Electronically Signed   By: Ilona Sorrel M.D.   On: 08/06/2017 16:19

## 2017-08-11 NOTE — Progress Notes (Signed)
[place]  Telephone:(336) [phone] Fax:(336) [phone]  ID: Veronica Boyd OB: 09/19/1946  MR#: [id number]  DJS#:[id number]  Patient Care Team: Glendon Axe, MD as PCP - General (Internal Medicine) Leonie Green, MD as Referring Physician (Surgery)  CHIEF COMPLAINT: Recurrent follicular lymphoma  INTERVAL HISTORY: Patient returns to clinic today for further evaluation and laboratory work. She feels significantly improved this past week. She does not complain of weakness or fatigue. Her right shoulder pain has resolved. She continues to have a mild chronic cough that is improving and no longer complains of shortness of breath. Her lymphadenopathy is noticeably decreasing. She has no neurologic complaints. She denies any fevers, night sweats, or weight loss. She has no chest pain. Her appetite is improved and she denies any nausea, vomiting, constipation, or diarrhea. She has no urinary complaint. Patient offers no further specific complaints today.  REVIEW OF SYSTEMS:   Review of Systems  Constitutional: Negative.  Negative for fever, malaise/fatigue and weight loss.  Respiratory: Positive for cough. Negative for hemoptysis and shortness of breath.   Cardiovascular: Negative.  Negative for chest pain and leg swelling.  Gastrointestinal: Negative.  Negative for abdominal pain, blood in stool, melena, nausea and vomiting.  Genitourinary: Negative.   Musculoskeletal: Negative.  Negative for joint pain.  Skin: Negative.  Negative for rash.  Neurological: Negative.  Negative for sensory change and weakness.  Psychiatric/Behavioral: Negative.  The patient is not nervous/anxious and does not have insomnia.    As per HPI. Otherwise, a complete review of systems is negative.   PAST MEDICAL HISTORY: Past Medical History:  Diagnosis Date  . Arthritis   . Cataract    bilateral  . Chicken pox   . Colon polyp   . Follicular lymphoma ([place]) 08/2016   lymph nodes     . Hyperlipidemia   . Osteoporosis     PAST SURGICAL HISTORY: Past Surgical History:  Procedure Laterality Date  . AXILLARY LYMPH NODE DISSECTION Right 08/21/2016   Procedure: AXILLARY LYMPH NODE excision;  Surgeon: Leonie Green, MD;  Location: [facility] ORS;  Service: General;  Laterality: Right;  . CATARACT EXTRACTION, BILATERAL Bilateral   . JOINT REPLACEMENT Left   . TONSILLECTOMY    . TOTAL HIP ARTHROPLASTY Left 1992    FAMILY HISTORY: Family History  Problem Relation Age of Onset  . Diabetes Sister   . Lung cancer Brother   . Diabetes Brother   . Basal cell carcinoma Daughter   . Breast cancer Paternal Aunt     ADVANCED DIRECTIVES (Y/N):  N  HEALTH MAINTENANCE: Social History  Substance Use Topics  . Smoking status: Never Smoker  . Smokeless tobacco: Never Used  . Alcohol use No     Colonoscopy:  PAP:  Bone density:  Lipid panel:  No Known Allergies  Current Outpatient Prescriptions  Medication Sig Dispense Refill  . albuterol (PROVENTIL HFA;VENTOLIN HFA) 108 (90 Base) MCG/ACT inhaler Inhale 2 puffs into the lungs every 6 (six) hours as needed for wheezing or shortness of breath. 1 Inhaler 2  . alendronate (FOSAMAX) 70 MG tablet Take 70 mg by mouth once a week. Take with a full glass of water on an empty stomach.    . chlorpheniramine-HYDROcodone (TUSSIONEX) 10-8 MG/5ML SUER Take 5 mLs by mouth every 12 (twelve) hours as needed for cough. 240 mL 0  . Cholecalciferol (VITAMIN D-3) 1000 units CAPS Take 1,000 Units by mouth daily.    . fentaNYL (DURAGESIC - DOSED MCG/HR) 12 MCG/HR  Place 1 patch (12.5 mcg total) onto the skin every 3 (three) days. 5 patch 0  . meloxicam (MOBIC) 15 MG tablet Take 1 tablet by mouth daily.    . Multiple Vitamins-Minerals (CENTRUM SILVER PO) Take 1 tablet by mouth daily.    . ondansetron (ZOFRAN-ODT) 8 MG disintegrating tablet Take 1 tablet (8 mg total) by mouth every 8 (eight) hours as needed for nausea or vomiting. 30 tablet 2   . pantoprazole (PROTONIX) 20 MG tablet Take 1 tablet by mouth daily.    . predniSONE (STERAPRED UNI-PAK 21 TAB) 10 MG (21) TBPK tablet Taper as directed 21 tablet 0  . simvastatin (ZOCOR) 20 MG tablet Take 1 tablet by mouth at bedtime.     Marland Kitchen tiZANidine (ZANAFLEX) 2 MG tablet Take 1 tablet by mouth at bedtime as needed.     . traMADol-acetaminophen (ULTRACET) 37.5-325 MG tablet Take 1 tablet by mouth every 8 (eight) hours as needed.     Marland Kitchen levofloxacin (LEVAQUIN) 500 MG tablet Take 1 tablet (500 mg total) by mouth daily. (Patient not taking: Reported on 08/06/2017) 7 tablet 0   No current facility-administered medications for this visit.    Facility-Administered Medications Ordered in Other Visits  Medication Dose Route Frequency Provider Last Rate Last Dose  . yttrium-90 injection 58.8 millicurie  50.2 millicurie Intravenous Once Chrystal, Eulas Post, MD        OBJECTIVE: Vitals:   08/13/17 1049  BP: (!) 149/92  Pulse: 92  Resp: 18  Temp: (!) 97.1 F (36.2 C)     Body mass index is 22.54 kg/m.    ECOG FS:0 - Asymptomatic  General: Well-developed, well-nourished, no acute distress. Eyes: Pink conjunctiva, anicteric sclera. Lungs: Clear to auscultation bilaterally. Heart: Regular rate and rhythm. No rubs, murmurs, or gallops. Abdomen: Soft, nontender, nondistended. No organomegaly noted, normoactive bowel sounds. Musculoskeletal: No edema, cyanosis, or clubbing. Neuro: Alert, answering all questions appropriately. Cranial nerves grossly intact. Skin: No rashes or petechiae noted. Psych: Normal affect. Lymphatics: Easily palpable lymphadenopathy in right axilla. Fullness noted surrounding right clavicle, improved.   LAB RESULTS:  Lab Results  Component Value Date   NA 137 08/13/2017   K 3.2 (L) 08/13/2017   CL 99 (L) 08/13/2017   CO2 27 08/13/2017   GLUCOSE 88 08/13/2017   BUN 13 08/13/2017   CREATININE 0.63 08/13/2017   CALCIUM 9.3 08/13/2017   PROT 6.3 (L) 08/13/2017    ALBUMIN 3.8 08/13/2017   AST 24 08/13/2017   ALT 14 08/13/2017   ALKPHOS 60 08/13/2017   BILITOT 0.6 08/13/2017   GFRNONAA >60 08/13/2017   GFRAA >60 08/13/2017    Lab Results  Component Value Date   WBC 4.7 08/13/2017   NEUTROABS 3.3 08/13/2017   HGB 15.0 08/13/2017   HCT 44.2 08/13/2017   MCV 91.3 08/13/2017   PLT 271 08/13/2017     STUDIES: Dg Chest 2 View  Result Date: 07/29/2017 CLINICAL DATA:  Worsening cough EXAM: CHEST  2 VIEW COMPARISON:  CT 06/11/2017, PET-CT 04/19/2017 FINDINGS: Small bilateral pleural effusions. Interval consolidation of right middle lobe and right lung base. Oval opacity in the right upper lobe somewhat nodular in configuration. Partial consolidation of the right upper lobe with increased right paratracheal opacity. Heart size is normal. Aortic atherosclerosis. No pneumothorax. IMPRESSION: 1. Small bilateral right greater than left pleural effusions 2. Multifocal consolidations in the right middle lobe, right lung base, and right upper lobe which may reflect multifocal pneumonia. Increased right paratracheal opacity, could reflect  atelectasis, pneumonia, or adenopathy. Airspace focus in the right upper lobe is somewhat nodular in appearance. CT could be helpful for further evaluation. Electronically Signed   By: Donavan Foil M.D.   On: 07/29/2017 15:51   Ct Chest W Contrast  Result Date: 08/06/2017 CLINICAL DATA:  Recurrent follicular lymphoma. Persistent cough and right chest and back pain. EXAM: CT CHEST WITH CONTRAST TECHNIQUE: Multidetector CT imaging of the chest was performed during intravenous contrast administration. CONTRAST:  7m ISOVUE-300 IOPAMIDOL (ISOVUE-300) INJECTION 61% COMPARISON:  07/29/2017 chest radiograph.  06/11/2017 chest CT. FINDINGS: Cardiovascular: Normal heart size. No significant pericardial fluid/thickening. Left main and left anterior descending coronary atherosclerosis. Atherosclerotic nonaneurysmal thoracic aorta. Normal  caliber pulmonary arteries. No central pulmonary emboli. Mediastinum/Nodes: No discrete thyroid nodules. Unremarkable esophagus. Bulky centrally necrotic 4.1 cm dominant right axillary node (series 2/image 28), previously 4.5 cm on 06/11/2017, mildly decreased. There is marked progression of an infiltrative heterogeneous soft tissue mass throughout the right ventral chest wall, with invasion of the right pectoral muscles, encasement of the anterior right first through sixth ribs, encasement of the right internal mammary vessels and involvement of the anterior right pleural space, measuring up to 4.0 cm thickness (series 2/ image 74), significantly increased from 0.9 cm. Infiltrative left supraclavicular adenopathy measuring up to 2.9 cm (series 2/ image 12), decreased from 3.7 cm. Infiltrative left retropectoral adenopathy measuring up to 2.1 cm (series 2/image 29), previously 2.4 cm, mildly decreased. Some of the mildly enlarged left axillary nodes are mildly increased, largest 1.1 cm (series 2/image 54), previously 0.9 cm. New bulky infiltrative right prevascular mediastinal adenopathy measuring up to 2.1 cm (series 2/image 53). New infiltrative bulky right paratracheal adenopathy measuring up to 3.2 cm (series 2/image 43). Infiltrative 1.8 cm left prevascular mediastinal node (series 2/image 43), previously 1.6 cm, mildly increased. New left internal mammary adenopathy measuring up to 2.0 cm (series 2/image 49). New right pericardial phrenic adenopathy measuring up to 1.7 cm (series 2/image 106). New right T5-T7 paraspinal posterior mediastinal adenopathy measuring up to 1.5 cm at the T6 level (series 2/image 49). Lungs/Pleura: No pneumothorax. Moderate dependent right pleural effusion is new. No left pleural effusion. Moderate compressive atelectasis in the dependent right lower lobe. New sharply marginated bandlike consolidation in the anterior right upper lobe compatible with postradiation change. New 6 mm sub  solid posterior left upper lobe nodule (series 3/ image 34). Upper abdomen: Unremarkable. Musculoskeletal: No aggressive appearing focal osseous lesions. Moderate thoracic spondylosis. New asymmetric lobular heterogeneously enhancing soft tissue throughout the right breast parenchyma. Asymmetric new skin thickening throughout the right breast. IMPRESSION: 1. Marked progression of infiltrative lymphoma throughout the ventral right chest wall with new widespread involvement of the right breast parenchyma and right pectoral musculature and encasement of the right anterior first through sixth ribs with invasion of the anterior right pleural space. 2. Marked progression of infiltrative lymphoma throughout the right greater than left mediastinum including bilateral prevascular, right paratracheal and right paraspinal regions. 3. Necrotic dominant right axillary node is mildly decreased in size. Infiltrative left supraclavicular and left retropectoral adenopathy is mildly decreased. 4. 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 New nonspecific subcentimeter subsolid left upper lobe pulmonary nodule, for which attention on follow-up chest CT is advised in 3-6 months. 5. New moderate dependent right pleural effusion. 6. Left main and 1 vessel coronary atherosclerosis. Aortic Atherosclerosis (ICD10-I70.0). These results will be called to the ordering clinician or representative by the Radiology Department at the imaging location. Electronically Signed  By: Ilona Sorrel M.D.   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  PLAN:    1. Recurrent follicular lymphoma: CT scan results from August 06, 2017 reviewed independently and reported as above with continued progression of disease. She completed XRT to her painful right lymph node. Patient only received Zevalin on July 30, 2017, one week prior to CT scan, therefore there may not have been time for inadequate  response. Given the improvement of her symptoms over the past week, suspect there is a significant response at this time. Will have to monitor for severe and prolonged cytopenias as well as mucocutaneous reactions.  Return to clinic in weekly for laboratory work only and then in 4 weeks for laboratory work and further evaluation. 2. Hyperglycemia: Improved. 3. Shoulder pain: Significantly improved. CT scan results as above. Likely secondary to progression of disease. Continue to monitor. 4. Shortness of breath/cough: Improved.  Approximately 30 minutes was spent in discussion of which greater than 50% was consultation.  Patient expressed understanding and was in agreement with this plan. She also understands that She can call clinic at any time with any questions, concerns, or complaints.   Cancer Staging Follicular lymphoma ([place]) Staging form: Lymphoid Neoplasms, AJCC 6th Edition - Clinical stage from 08/27/2016: Stage IIE (lymphoma only) - Signed by Lloyd Huger, MD on 08/27/2016   Lloyd Huger, MD   08/13/2017 11:01 AM

## 2017-08-13 ENCOUNTER — Inpatient Hospital Stay: Payer: Medicare Other

## 2017-08-13 ENCOUNTER — Inpatient Hospital Stay (HOSPITAL_BASED_OUTPATIENT_CLINIC_OR_DEPARTMENT_OTHER): Payer: Medicare Other | Admitting: Oncology

## 2017-08-13 VITALS — BP 149/92 | HR 92 | Temp 97.1°F | Resp 18 | Wt 111.6 lb

## 2017-08-13 DIAGNOSIS — N6489 Other specified disorders of breast: Secondary | ICD-10-CM | POA: Diagnosis not present

## 2017-08-13 DIAGNOSIS — M25511 Pain in right shoulder: Secondary | ICD-10-CM

## 2017-08-13 DIAGNOSIS — Z803 Family history of malignant neoplasm of breast: Secondary | ICD-10-CM | POA: Diagnosis not present

## 2017-08-13 DIAGNOSIS — R739 Hyperglycemia, unspecified: Secondary | ICD-10-CM

## 2017-08-13 DIAGNOSIS — I700 Atherosclerosis of aorta: Secondary | ICD-10-CM | POA: Diagnosis not present

## 2017-08-13 DIAGNOSIS — Z801 Family history of malignant neoplasm of trachea, bronchus and lung: Secondary | ICD-10-CM

## 2017-08-13 DIAGNOSIS — E785 Hyperlipidemia, unspecified: Secondary | ICD-10-CM | POA: Diagnosis not present

## 2017-08-13 DIAGNOSIS — M810 Age-related osteoporosis without current pathological fracture: Secondary | ICD-10-CM

## 2017-08-13 DIAGNOSIS — C8208 Follicular lymphoma grade I, lymph nodes of multiple sites: Secondary | ICD-10-CM

## 2017-08-13 DIAGNOSIS — C8290 Follicular lymphoma, unspecified, unspecified site: Secondary | ICD-10-CM | POA: Diagnosis not present

## 2017-08-13 DIAGNOSIS — J700 Acute pulmonary manifestations due to radiation: Secondary | ICD-10-CM

## 2017-08-13 DIAGNOSIS — R05 Cough: Secondary | ICD-10-CM

## 2017-08-13 DIAGNOSIS — R590 Localized enlarged lymph nodes: Secondary | ICD-10-CM | POA: Diagnosis not present

## 2017-08-13 DIAGNOSIS — C8298 Follicular lymphoma, unspecified, lymph nodes of multiple sites: Secondary | ICD-10-CM

## 2017-08-13 LAB — CBC WITH DIFFERENTIAL/PLATELET
BASOS ABS: 0 10*3/uL (ref 0–0.1)
Basophils Relative: 0 %
EOS PCT: 2 %
Eosinophils Absolute: 0.1 10*3/uL (ref 0–0.7)
HCT: 44.2 % (ref 35.0–47.0)
Hemoglobin: 15 g/dL (ref 12.0–16.0)
LYMPHS ABS: 0.4 10*3/uL — AB (ref 1.0–3.6)
LYMPHS PCT: 9 %
MCH: 30.9 pg (ref 26.0–34.0)
MCHC: 33.8 g/dL (ref 32.0–36.0)
MCV: 91.3 fL (ref 80.0–100.0)
MONO ABS: 0.9 10*3/uL (ref 0.2–0.9)
Monocytes Relative: 18 %
Neutro Abs: 3.3 10*3/uL (ref 1.4–6.5)
Neutrophils Relative %: 71 %
Platelets: 271 10*3/uL (ref 150–440)
RBC: 4.84 MIL/uL (ref 3.80–5.20)
RDW: 13.6 % (ref 11.5–14.5)
WBC: 4.7 10*3/uL (ref 3.6–11.0)

## 2017-08-13 LAB — COMPREHENSIVE METABOLIC PANEL
ALBUMIN: 3.8 g/dL (ref 3.5–5.0)
ALT: 14 U/L (ref 14–54)
AST: 24 U/L (ref 15–41)
Alkaline Phosphatase: 60 U/L (ref 38–126)
Anion gap: 11 (ref 5–15)
BILIRUBIN TOTAL: 0.6 mg/dL (ref 0.3–1.2)
BUN: 13 mg/dL (ref 6–20)
CO2: 27 mmol/L (ref 22–32)
Calcium: 9.3 mg/dL (ref 8.9–10.3)
Chloride: 99 mmol/L — ABNORMAL LOW (ref 101–111)
Creatinine, Ser: 0.63 mg/dL (ref 0.44–1.00)
GFR calc Af Amer: >60 mL/min (ref >60)
GFR calc non Af Amer: >60 mL/min (ref >60)
GLUCOSE: 88 mg/dL (ref 65–99)
POTASSIUM: 3.2 mmol/L — AB (ref 3.5–5.1)
SODIUM: 137 mmol/L (ref 135–145)
TOTAL PROTEIN: 6.3 g/dL — AB (ref 6.5–8.1)

## 2017-08-13 NOTE — Progress Notes (Signed)
Patient reports she is feeling much better today, cough is improved.

## 2017-08-20 ENCOUNTER — Inpatient Hospital Stay: Payer: Medicare Other

## 2017-08-20 DIAGNOSIS — C8290 Follicular lymphoma, unspecified, unspecified site: Secondary | ICD-10-CM | POA: Diagnosis not present

## 2017-08-20 DIAGNOSIS — C8298 Follicular lymphoma, unspecified, lymph nodes of multiple sites: Secondary | ICD-10-CM

## 2017-08-20 LAB — COMPREHENSIVE METABOLIC PANEL
ALK PHOS: 60 U/L (ref 38–126)
ALT: 27 U/L (ref 14–54)
ANION GAP: 10 (ref 5–15)
AST: 33 U/L (ref 15–41)
Albumin: 3.9 g/dL (ref 3.5–5.0)
BILIRUBIN TOTAL: 0.7 mg/dL (ref 0.3–1.2)
BUN: 9 mg/dL (ref 6–20)
CALCIUM: 9.4 mg/dL (ref 8.9–10.3)
CO2: 25 mmol/L (ref 22–32)
CREATININE: 0.78 mg/dL (ref 0.44–1.00)
Chloride: 101 mmol/L (ref 101–111)
GFR calc non Af Amer: >60 mL/min (ref >60)
GLUCOSE: 107 mg/dL — AB (ref 65–99)
Potassium: 3.8 mmol/L (ref 3.5–5.1)
Sodium: 136 mmol/L (ref 135–145)
TOTAL PROTEIN: 6.6 g/dL (ref 6.5–8.1)

## 2017-08-20 LAB — CBC WITH DIFFERENTIAL/PLATELET
Basophils Absolute: 0 10*3/uL (ref 0–0.1)
Basophils Relative: 1 %
Eosinophils Absolute: 0.1 10*3/uL (ref 0–0.7)
Eosinophils Relative: 2 %
HEMATOCRIT: 42.7 % (ref 35.0–47.0)
HEMOGLOBIN: 14.5 g/dL (ref 12.0–16.0)
LYMPHS ABS: 1.2 10*3/uL (ref 1.0–3.6)
LYMPHS PCT: 32 %
MCH: 30.8 pg (ref 26.0–34.0)
MCHC: 33.9 g/dL (ref 32.0–36.0)
MCV: 90.8 fL (ref 80.0–100.0)
MONOS PCT: 11 %
Monocytes Absolute: 0.4 10*3/uL (ref 0.2–0.9)
NEUTROS ABS: 2 10*3/uL (ref 1.4–6.5)
Neutrophils Relative %: 54 %
Platelets: 75 10*3/uL — ABNORMAL LOW (ref 150–440)
RBC: 4.7 MIL/uL (ref 3.80–5.20)
RDW: 14.1 % (ref 11.5–14.5)
WBC: 3.7 10*3/uL (ref 3.6–11.0)

## 2017-08-27 ENCOUNTER — Inpatient Hospital Stay: Payer: Medicare Other

## 2017-08-27 DIAGNOSIS — C8298 Follicular lymphoma, unspecified, lymph nodes of multiple sites: Secondary | ICD-10-CM

## 2017-08-27 DIAGNOSIS — C8290 Follicular lymphoma, unspecified, unspecified site: Secondary | ICD-10-CM | POA: Diagnosis not present

## 2017-08-27 LAB — COMPREHENSIVE METABOLIC PANEL
ALK PHOS: 70 U/L (ref 38–126)
ALT: 28 U/L (ref 14–54)
AST: 35 U/L (ref 15–41)
Albumin: 3.9 g/dL (ref 3.5–5.0)
Anion gap: 11 (ref 5–15)
BUN: 13 mg/dL (ref 6–20)
CO2: 25 mmol/L (ref 22–32)
CREATININE: 0.8 mg/dL (ref 0.44–1.00)
Calcium: 9.6 mg/dL (ref 8.9–10.3)
Chloride: 103 mmol/L (ref 101–111)
Glucose, Bld: 114 mg/dL — ABNORMAL HIGH (ref 65–99)
Potassium: 4 mmol/L (ref 3.5–5.1)
Sodium: 139 mmol/L (ref 135–145)
TOTAL PROTEIN: 6.7 g/dL (ref 6.5–8.1)
Total Bilirubin: 0.8 mg/dL (ref 0.3–1.2)

## 2017-08-27 LAB — CBC WITH DIFFERENTIAL/PLATELET
Basophils Absolute: 0 10*3/uL (ref 0–0.1)
Basophils Relative: 1 %
Eosinophils Absolute: 0.1 10*3/uL (ref 0–0.7)
Eosinophils Relative: 3 %
HEMATOCRIT: 41.3 % (ref 35.0–47.0)
HEMOGLOBIN: 13.8 g/dL (ref 12.0–16.0)
LYMPHS ABS: 1.8 10*3/uL (ref 1.0–3.6)
LYMPHS PCT: 65 %
MCH: 30.5 pg (ref 26.0–34.0)
MCHC: 33.4 g/dL (ref 32.0–36.0)
MCV: 91.3 fL (ref 80.0–100.0)
Monocytes Absolute: 0.2 10*3/uL (ref 0.2–0.9)
Monocytes Relative: 6 %
NEUTROS ABS: 0.7 10*3/uL — AB (ref 1.4–6.5)
NEUTROS PCT: 25 %
Platelets: 59 10*3/uL — ABNORMAL LOW (ref 150–440)
RBC: 4.52 MIL/uL (ref 3.80–5.20)
RDW: 14 % (ref 11.5–14.5)
WBC: 2.7 10*3/uL — AB (ref 3.6–11.0)

## 2017-09-03 ENCOUNTER — Inpatient Hospital Stay: Payer: Medicare Other

## 2017-09-03 ENCOUNTER — Telehealth: Payer: Self-pay | Admitting: *Deleted

## 2017-09-03 DIAGNOSIS — C8290 Follicular lymphoma, unspecified, unspecified site: Secondary | ICD-10-CM | POA: Diagnosis not present

## 2017-09-03 DIAGNOSIS — C8298 Follicular lymphoma, unspecified, lymph nodes of multiple sites: Secondary | ICD-10-CM

## 2017-09-03 LAB — COMPREHENSIVE METABOLIC PANEL
ALBUMIN: 4.1 g/dL (ref 3.5–5.0)
ALT: 18 U/L (ref 14–54)
ANION GAP: 11 (ref 5–15)
AST: 29 U/L (ref 15–41)
Alkaline Phosphatase: 76 U/L (ref 38–126)
BUN: 11 mg/dL (ref 6–20)
CO2: 26 mmol/L (ref 22–32)
Calcium: 9.7 mg/dL (ref 8.9–10.3)
Chloride: 103 mmol/L (ref 101–111)
Creatinine, Ser: 0.82 mg/dL (ref 0.44–1.00)
GFR calc Af Amer: >60 mL/min (ref >60)
Glucose, Bld: 138 mg/dL — ABNORMAL HIGH (ref 65–99)
POTASSIUM: 4.2 mmol/L (ref 3.5–5.1)
Sodium: 140 mmol/L (ref 135–145)
Total Bilirubin: 0.7 mg/dL (ref 0.3–1.2)
Total Protein: 6.8 g/dL (ref 6.5–8.1)

## 2017-09-03 LAB — CBC WITH DIFFERENTIAL/PLATELET
BASOS ABS: 0 10*3/uL (ref 0–0.1)
BASOS PCT: 0 %
Eosinophils Absolute: 0.1 10*3/uL (ref 0–0.7)
Eosinophils Relative: 3 %
HCT: 39.2 % (ref 35.0–47.0)
Hemoglobin: 13.3 g/dL (ref 12.0–16.0)
Lymphocytes Relative: 71 %
Lymphs Abs: 1.7 10*3/uL (ref 1.0–3.6)
MCH: 30.8 pg (ref 26.0–34.0)
MCHC: 33.9 g/dL (ref 32.0–36.0)
MCV: 91.1 fL (ref 80.0–100.0)
MONO ABS: 0.3 10*3/uL (ref 0.2–0.9)
Monocytes Relative: 10 %
Neutro Abs: 0.4 10*3/uL — ABNORMAL LOW (ref 1.4–6.5)
Neutrophils Relative %: 16 %
PLATELETS: 74 10*3/uL — AB (ref 150–440)
RBC: 4.3 MIL/uL (ref 3.80–5.20)
RDW: 14.5 % (ref 11.5–14.5)
WBC: 2.4 10*3/uL — ABNORMAL LOW (ref 3.6–11.0)

## 2017-09-03 NOTE — Telephone Encounter (Signed)
Call placed to pt to inform her of lab results from earlier today. Pts neutrophil count was 0.4, pt advised about neutropenic precautions. Pt denies any fevers or other symptoms today. Pt instructed to call back if she were to develop temp >100.4 or new symptoms. Pt verbalized understanding of plan.

## 2017-09-04 NOTE — Progress Notes (Signed)
[place]  Telephone:(336) [phone] Fax:(336) [phone]  ID: Veronica Boyd OB: May 30, 1946  MR#: [id number]  QAS#:[id number]  Patient Care Team: Glendon Axe, MD as PCP - General (Internal Medicine) Leonie Green, MD as Referring Physician (Surgery)  CHIEF COMPLAINT: Recurrent follicular lymphoma  INTERVAL HISTORY: Patient returns to clinic today for further evaluation and laboratory work. She states her right shoulder/scapular pain has become worse.  Her right breast remains swollen and "heavy".  She does not complain of weakness or fatigue. She continues to have a mild chronic cough that is improving and no longer complains of shortness of breath. She has no neurologic complaints. She denies any fevers, night sweats, or weight loss. She has no chest pain. Her appetite is improved and she denies any nausea, vomiting, constipation, or diarrhea. She has no urinary complaint. Patient offers no further specific complaints today.  REVIEW OF SYSTEMS:   Review of Systems  Constitutional: Positive for malaise/fatigue. Negative for fever and weight loss.  Respiratory: Positive for cough. Negative for hemoptysis and shortness of breath.   Cardiovascular: Negative.  Negative for chest pain and leg swelling.  Gastrointestinal: Negative.  Negative for abdominal pain, blood in stool, melena, nausea and vomiting.  Genitourinary: Negative.   Musculoskeletal: Positive for back pain and joint pain.  Skin: Negative.  Negative for rash.  Neurological: Positive for weakness. Negative for sensory change.  Psychiatric/Behavioral: Negative.  The patient is not nervous/anxious and does not have insomnia.    As per HPI. Otherwise, a complete review of systems is negative.   PAST MEDICAL HISTORY: Past Medical History:  Diagnosis Date  . Arthritis   . Cataract    bilateral  . Chicken pox   . Colon polyp   . Follicular lymphoma ([place]) 08/2016   lymph nodes   . Hyperlipidemia     . Osteoporosis     PAST SURGICAL HISTORY: Past Surgical History:  Procedure Laterality Date  . CATARACT EXTRACTION, BILATERAL Bilateral   . JOINT REPLACEMENT Left   . TONSILLECTOMY    . TOTAL HIP ARTHROPLASTY Left 1992    FAMILY HISTORY: Family History  Problem Relation Age of Onset  . Diabetes Sister   . Lung cancer Brother   . Diabetes Brother   . Basal cell carcinoma Daughter   . Breast cancer Paternal Aunt     ADVANCED DIRECTIVES (Y/N):  N  HEALTH MAINTENANCE: Social History   Tobacco Use  . Smoking status: Never Smoker  . Smokeless tobacco: Never Used  Substance Use Topics  . Alcohol use: No  . Drug use: No     Colonoscopy:  PAP:  Bone density:  Lipid panel:  No Known Allergies  Current Outpatient Medications  Medication Sig Dispense Refill  . albuterol (PROVENTIL HFA;VENTOLIN HFA) 108 (90 Base) MCG/ACT inhaler Inhale 2 puffs into the lungs every 6 (six) hours as needed for wheezing or shortness of breath. 1 Inhaler 2  . alendronate (FOSAMAX) 70 MG tablet Take 70 mg by mouth once a week. Take with a full glass of water on an empty stomach.    . chlorpheniramine-HYDROcodone (TUSSIONEX) 10-8 MG/5ML SUER Take 5 mLs by mouth every 12 (twelve) hours as needed for cough. 240 mL 0  . Cholecalciferol (VITAMIN D-3) 1000 units CAPS Take 1,000 Units by mouth daily.    . fentaNYL (DURAGESIC - DOSED MCG/HR) 12 MCG/HR Place 1 patch (12.5 mcg total) onto the skin every 3 (three) days. 5 patch 0  . HYDROcodone-acetaminophen (NORCO/VICODIN) 5-325 MG  tablet Take 1 tablet by mouth every 6 (six) hours as needed for moderate pain. 30 tablet 0  . levofloxacin (LEVAQUIN) 500 MG tablet Take 1 tablet (500 mg total) by mouth daily. 7 tablet 0  . meloxicam (MOBIC) 15 MG tablet Take 1 tablet by mouth daily.    . Multiple Vitamins-Minerals (CENTRUM SILVER PO) Take 1 tablet by mouth daily.    . ondansetron (ZOFRAN-ODT) 8 MG disintegrating tablet Take 1 tablet (8 mg total) by mouth every  8 (eight) hours as needed for nausea or vomiting. 30 tablet 2  . pantoprazole (PROTONIX) 20 MG tablet Take 1 tablet by mouth daily.    . simvastatin (ZOCOR) 20 MG tablet Take 1 tablet by mouth at bedtime.     Marland Kitchen tiZANidine (ZANAFLEX) 2 MG tablet Take 1 tablet by mouth at bedtime as needed.     . traMADol-acetaminophen (ULTRACET) 37.5-325 MG tablet Take 1 tablet by mouth every 8 (eight) hours as needed.      No current facility-administered medications for this visit.    Facility-Administered Medications Ordered in Other Visits  Medication Dose Route Frequency Provider Last Rate Last Dose  . yttrium-90 injection 78.5 millicurie  88.5 millicurie Intravenous Once Chrystal, Glenn, MD        OBJECTIVE: Vitals:   09/10/17 1039  BP: 112/69  Pulse: 88  Resp: 18  Temp: 97.7 F (36.5 C)     Body mass index is 22.96 kg/m.    ECOG FS:0 - Asymptomatic  General: Well-developed, well-nourished, no acute distress. Eyes: Pink conjunctiva, anicteric sclera. Breasts: Right breast enlarged with what appears to be lymphedema. Lungs: Clear to auscultation bilaterally. Heart: Regular rate and rhythm. No rubs, murmurs, or gallops. Abdomen: Soft, nontender, nondistended. No organomegaly noted, normoactive bowel sounds. Musculoskeletal: No edema, cyanosis, or clubbing. Neuro: Alert, answering all questions appropriately. Cranial nerves grossly intact. Skin: No rashes or petechiae noted. Psych: Normal affect. Lymphatics: Easily palpable lymphadenopathy in right axilla. Fullness noted surrounding right clavicle, nearly resolved.    LAB RESULTS:  Lab Results  Component Value Date   NA 138 09/10/2017   K 3.8 09/10/2017   CL 103 09/10/2017   CO2 27 09/10/2017   GLUCOSE 93 09/10/2017   BUN 15 09/10/2017   CREATININE 0.72 09/10/2017   CALCIUM 9.4 09/10/2017   PROT 6.0 (L) 09/10/2017   ALBUMIN 3.6 09/10/2017   AST 24 09/10/2017   ALT 14 09/10/2017   ALKPHOS 68 09/10/2017   BILITOT 0.5 09/10/2017    GFRNONAA >60 09/10/2017   GFRAA >60 09/10/2017    Lab Results  Component Value Date   WBC 2.9 (L) 09/10/2017   NEUTROABS 0.8 (L) 09/10/2017   HGB 11.7 (L) 09/10/2017   HCT 35.0 09/10/2017   MCV 93.0 09/10/2017   PLT 213 09/10/2017     STUDIES: No results found.  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  PLAN:    1. Recurrent follicular lymphoma: CT scan results from August 06, 2017 reviewed independently with continued progression of disease. She completed XRT to her painful right axillary lymph node. Patient only received Zevalin on July 30, 2017, one week prior to CT scan, therefore there may not have been time for an adequate response.  Because patient's pain in her right shoulder has become worse, we will schedule CT scans of the neck, chest, abdomen and pelvis to assess her response.  Return to clinic in 1 week for further evaluation. 2. Hyperglycemia: Improved. 3. Shoulder pain: CT scan results as  above.  Will consider MRI if CT scans are unrevealing. 4. Shortness of breath/cough: Improved. 5.  Right breast lymphedema: CT scans as above.  Patient may benefit from a referral to lymphedema clinic in the future if no other etiology can be determined. 6.  Leukopenia: Secondary to Zevalin, monitor.  Approximately 30 minutes was spent in discussion of which greater than 50% was consultation.  Patient expressed understanding and was in agreement with this plan. She also understands that She can call clinic at any time with any questions, concerns, or complaints.   Cancer Staging Follicular lymphoma ([place]) Staging form: Lymphoid Neoplasms, AJCC 6th Edition - Clinical stage from 08/27/2016: Stage IIE (lymphoma only) - Signed by Lloyd Huger, MD on 08/27/2016   Lloyd Huger, MD   09/11/2017 1:12 PM

## 2017-09-06 ENCOUNTER — Telehealth: Payer: Self-pay | Admitting: *Deleted

## 2017-09-06 MED ORDER — PREDNISONE 20 MG PO TABS: 60.0000 mg | ORAL_TABLET | Freq: Every day | ORAL | 0 refills | Status: DC

## 2017-09-06 MED ORDER — HYDROCODONE-ACETAMINOPHEN 5-325 MG PO TABS: 1.0000 | ORAL_TABLET | Freq: Four times a day (QID) | ORAL | 0 refills | Status: DC | PRN

## 2017-09-06 NOTE — Telephone Encounter (Signed)
Daughter Shirlean Mylar called to report that patient is having severe back pain to the point that she is having difficulty moving. Asking for advice of what to do.Please advise.

## 2017-09-06 NOTE — Telephone Encounter (Signed)
Per VO Dr Rogue Bussing, Norco 5/325 mg 1 every 6 - 8 hours as needed #30, Prednisone 60 mg daily times 5 days and let Dr Grayland Ormond know so he can follow up on this.  I discussed this with daughter Shirlean Mylar who states she has an appointment Tuesday Dr Grayland Ormond.Marland Kitchen

## 2017-09-09 ENCOUNTER — Other Ambulatory Visit: Payer: Self-pay | Admitting: *Deleted

## 2017-09-09 DIAGNOSIS — C8290 Follicular lymphoma, unspecified, unspecified site: Secondary | ICD-10-CM

## 2017-09-10 ENCOUNTER — Inpatient Hospital Stay: Payer: Medicare Other

## 2017-09-10 ENCOUNTER — Inpatient Hospital Stay: Payer: Medicare Other | Attending: Oncology | Admitting: Oncology

## 2017-09-10 VITALS — BP 112/69 | HR 88 | Temp 97.7°F | Resp 18 | Wt 113.7 lb

## 2017-09-10 DIAGNOSIS — C8290 Follicular lymphoma, unspecified, unspecified site: Secondary | ICD-10-CM

## 2017-09-10 DIAGNOSIS — M810 Age-related osteoporosis without current pathological fracture: Secondary | ICD-10-CM | POA: Insufficient documentation

## 2017-09-10 DIAGNOSIS — I890 Lymphedema, not elsewhere classified: Secondary | ICD-10-CM | POA: Insufficient documentation

## 2017-09-10 DIAGNOSIS — C8208 Follicular lymphoma grade I, lymph nodes of multiple sites: Secondary | ICD-10-CM | POA: Insufficient documentation

## 2017-09-10 DIAGNOSIS — Z7689 Persons encountering health services in other specified circumstances: Secondary | ICD-10-CM | POA: Diagnosis not present

## 2017-09-10 DIAGNOSIS — M25511 Pain in right shoulder: Secondary | ICD-10-CM | POA: Insufficient documentation

## 2017-09-10 DIAGNOSIS — Z801 Family history of malignant neoplasm of trachea, bronchus and lung: Secondary | ICD-10-CM | POA: Diagnosis not present

## 2017-09-10 DIAGNOSIS — D72819 Decreased white blood cell count, unspecified: Secondary | ICD-10-CM | POA: Diagnosis not present

## 2017-09-10 DIAGNOSIS — R0602 Shortness of breath: Secondary | ICD-10-CM | POA: Diagnosis not present

## 2017-09-10 DIAGNOSIS — E785 Hyperlipidemia, unspecified: Secondary | ICD-10-CM | POA: Insufficient documentation

## 2017-09-10 DIAGNOSIS — M129 Arthropathy, unspecified: Secondary | ICD-10-CM | POA: Diagnosis not present

## 2017-09-10 DIAGNOSIS — Z79899 Other long term (current) drug therapy: Secondary | ICD-10-CM | POA: Insufficient documentation

## 2017-09-10 DIAGNOSIS — Z803 Family history of malignant neoplasm of breast: Secondary | ICD-10-CM | POA: Diagnosis not present

## 2017-09-10 DIAGNOSIS — N6310 Unspecified lump in the right breast, unspecified quadrant: Secondary | ICD-10-CM | POA: Insufficient documentation

## 2017-09-10 DIAGNOSIS — Z5111 Encounter for antineoplastic chemotherapy: Secondary | ICD-10-CM | POA: Diagnosis not present

## 2017-09-10 DIAGNOSIS — R05 Cough: Secondary | ICD-10-CM | POA: Diagnosis not present

## 2017-09-10 DIAGNOSIS — R739 Hyperglycemia, unspecified: Secondary | ICD-10-CM | POA: Insufficient documentation

## 2017-09-10 DIAGNOSIS — M546 Pain in thoracic spine: Secondary | ICD-10-CM | POA: Diagnosis not present

## 2017-09-10 DIAGNOSIS — Z5112 Encounter for antineoplastic immunotherapy: Secondary | ICD-10-CM | POA: Diagnosis not present

## 2017-09-10 DIAGNOSIS — J90 Pleural effusion, not elsewhere classified: Secondary | ICD-10-CM | POA: Insufficient documentation

## 2017-09-10 LAB — COMPREHENSIVE METABOLIC PANEL
ALT: 14 U/L (ref 14–54)
AST: 24 U/L (ref 15–41)
Albumin: 3.6 g/dL (ref 3.5–5.0)
Alkaline Phosphatase: 68 U/L (ref 38–126)
Anion gap: 8 (ref 5–15)
BILIRUBIN TOTAL: 0.5 mg/dL (ref 0.3–1.2)
BUN: 15 mg/dL (ref 6–20)
CHLORIDE: 103 mmol/L (ref 101–111)
CO2: 27 mmol/L (ref 22–32)
Calcium: 9.4 mg/dL (ref 8.9–10.3)
Creatinine, Ser: 0.72 mg/dL (ref 0.44–1.00)
Glucose, Bld: 93 mg/dL (ref 65–99)
POTASSIUM: 3.8 mmol/L (ref 3.5–5.1)
Sodium: 138 mmol/L (ref 135–145)
TOTAL PROTEIN: 6 g/dL — AB (ref 6.5–8.1)

## 2017-09-10 LAB — CBC WITH DIFFERENTIAL/PLATELET
Basophils Absolute: 0 10*3/uL (ref 0–0.1)
Basophils Relative: 1 %
EOS PCT: 0 %
Eosinophils Absolute: 0 10*3/uL (ref 0–0.7)
HEMATOCRIT: 35 % (ref 35.0–47.0)
Hemoglobin: 11.7 g/dL — ABNORMAL LOW (ref 12.0–16.0)
LYMPHS ABS: 1.2 10*3/uL (ref 1.0–3.6)
LYMPHS PCT: 42 %
MCH: 31 pg (ref 26.0–34.0)
MCHC: 33.4 g/dL (ref 32.0–36.0)
MCV: 93 fL (ref 80.0–100.0)
MONO ABS: 0.9 10*3/uL (ref 0.2–0.9)
Monocytes Relative: 30 %
Neutro Abs: 0.8 10*3/uL — ABNORMAL LOW (ref 1.4–6.5)
Neutrophils Relative %: 27 %
PLATELETS: 213 10*3/uL (ref 150–440)
RBC: 3.76 MIL/uL — ABNORMAL LOW (ref 3.80–5.20)
RDW: 15.5 % — AB (ref 11.5–14.5)
WBC: 2.9 10*3/uL — AB (ref 3.6–11.0)

## 2017-09-12 ENCOUNTER — Ambulatory Visit
Admission: RE | Admit: 2017-09-12 | Discharge: 2017-09-12 | Disposition: A | Discharge status: Home / Self Care | Payer: Medicare Other | Source: Ambulatory Visit | Attending: Oncology | Admitting: Oncology

## 2017-09-12 ENCOUNTER — Telehealth: Payer: Self-pay | Admitting: *Deleted

## 2017-09-12 DIAGNOSIS — C8208 Follicular lymphoma grade I, lymph nodes of multiple sites: Secondary | ICD-10-CM | POA: Diagnosis not present

## 2017-09-12 DIAGNOSIS — N6310 Unspecified lump in the right breast, unspecified quadrant: Secondary | ICD-10-CM | POA: Insufficient documentation

## 2017-09-12 MED ORDER — IOPAMIDOL (ISOVUE-300) INJECTION 61%
150.0000 mL | Freq: Once | INTRAVENOUS | Status: AC | PRN
  Administered 2017-09-12: 100 mL via INTRAVENOUS

## 2017-09-12 NOTE — Telephone Encounter (Signed)
Daughter called to report that her mothers back pain is no better and is asking if you would order a muscle relaxer to see if that helps. Please advise

## 2017-09-12 NOTE — Telephone Encounter (Signed)
Ct scan was today, let me look at those results first.

## 2017-09-13 ENCOUNTER — Inpatient Hospital Stay (HOSPITAL_BASED_OUTPATIENT_CLINIC_OR_DEPARTMENT_OTHER): Payer: Medicare Other | Admitting: Oncology

## 2017-09-13 VITALS — BP 116/76 | HR 106 | Temp 97.2°F | Resp 18 | Wt 110.0 lb

## 2017-09-13 DIAGNOSIS — M546 Pain in thoracic spine: Secondary | ICD-10-CM

## 2017-09-13 DIAGNOSIS — Z79899 Other long term (current) drug therapy: Secondary | ICD-10-CM

## 2017-09-13 DIAGNOSIS — R739 Hyperglycemia, unspecified: Secondary | ICD-10-CM

## 2017-09-13 DIAGNOSIS — E785 Hyperlipidemia, unspecified: Secondary | ICD-10-CM

## 2017-09-13 DIAGNOSIS — N6310 Unspecified lump in the right breast, unspecified quadrant: Secondary | ICD-10-CM

## 2017-09-13 DIAGNOSIS — M25511 Pain in right shoulder: Secondary | ICD-10-CM

## 2017-09-13 DIAGNOSIS — J90 Pleural effusion, not elsewhere classified: Secondary | ICD-10-CM

## 2017-09-13 DIAGNOSIS — M129 Arthropathy, unspecified: Secondary | ICD-10-CM

## 2017-09-13 DIAGNOSIS — D72819 Decreased white blood cell count, unspecified: Secondary | ICD-10-CM

## 2017-09-13 DIAGNOSIS — Z5111 Encounter for antineoplastic chemotherapy: Secondary | ICD-10-CM | POA: Diagnosis not present

## 2017-09-13 DIAGNOSIS — R05 Cough: Secondary | ICD-10-CM | POA: Diagnosis not present

## 2017-09-13 DIAGNOSIS — M810 Age-related osteoporosis without current pathological fracture: Secondary | ICD-10-CM | POA: Diagnosis not present

## 2017-09-13 DIAGNOSIS — C8208 Follicular lymphoma grade I, lymph nodes of multiple sites: Secondary | ICD-10-CM | POA: Diagnosis not present

## 2017-09-13 DIAGNOSIS — Z803 Family history of malignant neoplasm of breast: Secondary | ICD-10-CM

## 2017-09-13 DIAGNOSIS — Z801 Family history of malignant neoplasm of trachea, bronchus and lung: Secondary | ICD-10-CM

## 2017-09-13 MED ORDER — LIDOCAINE-PRILOCAINE 2.5-2.5 % EX CREA: 1.0000 "application " | TOPICAL_CREAM | CUTANEOUS | 0 refills | Status: DC | PRN

## 2017-09-13 MED ORDER — HYDROCODONE-ACETAMINOPHEN 5-325 MG PO TABS: 1.0000 | ORAL_TABLET | Freq: Four times a day (QID) | ORAL | 0 refills | Status: DC | PRN

## 2017-09-13 NOTE — Telephone Encounter (Signed)
Patient appears to have progressive disease indicating Zevalin may not be working.  Can she see 1 of the nurse practitioners and symptom management clinic today to discuss pain control and then we can start to set up future treatments.  Thanks.

## 2017-09-13 NOTE — Progress Notes (Signed)
Symptom Management Consult note [facility]  [phone] [phone]  Patient Care Team: Glendon Axe, MD as PCP - General (Internal Medicine) Leonie Green, MD as Referring Physician (Surgery)   Name of the patient: Veronica Boyd  [id number]  02-16-1946   Date of visit: 09/13/17  Diagnosis- Recurrent follicular lymphoma   Chief complaint/ Reason for visit- Lower back pain   Heme/Onc history: Recurrent follicular lymphoma: CT scan results from June 11, 2017 reviewed independently with continued progression of disease. She completed XRT to her painful right lymph node. Patient received 4 cycles of single agent rituximab completing on October 02, 2016. Proceed with 250 mg/m of Rituxan today and then a second infusion in 1 week. Patient will then receive her Zevalin dosing 4 hours after her second infusion of Rituxan. Will have to monitor for severe and prolonged cytopenias as well as mucocutaneous reactions. Return to clinic in 1 week for further evaluation, laboratory work, Rituxan and Zevalin.   Interval history- Patient presents today for worsening back pain. States that her pain in her back is located midline, mid way down her back. It is constant and has becomes worse over the course of the last month. Currently taking Vicodin 5-325 every 6 hours for pain. States she takes this religiously and sometimes takes it early because the pain is so severe. She rates the pain 10 out of 10 in between doses of vicodin and with movement. She rotates hot and cold packs with minimal relief. Currently is not using her fentanyl patch because she states it's not helping. States that her right breast continues to be uncomfortable and does not feel like it is decreasing in size. She denies headaches, fever, chills, abdominal pain or urinary complaints.  ECOG FS:0 - Asymptomatic  Review of systems- Review of Systems  Constitutional: Positive for  malaise/fatigue. Negative for chills, fever and weight loss.  HENT: Negative.   Eyes: Negative.   Respiratory: Negative.   Cardiovascular: Negative.   Gastrointestinal: Negative.   Genitourinary: Negative.   Musculoskeletal: Positive for back pain.       Thoracic spine  Skin: Negative.   Neurological: Positive for weakness. Negative for dizziness and headaches.  Endo/Heme/Allergies: Negative.   Psychiatric/Behavioral: Negative.      Current treatment- Zevalin  No Known Allergies   Past Medical History:  Diagnosis Date  . Arthritis   . Cataract    bilateral  . Chicken pox   . Colon polyp   . Follicular lymphoma ([place]) 08/2016   lymph nodes   . Hyperlipidemia   . Osteoporosis      Past Surgical History:  Procedure Laterality Date  . CATARACT EXTRACTION, BILATERAL Bilateral   . JOINT REPLACEMENT Left   . TONSILLECTOMY    . TOTAL HIP ARTHROPLASTY Left 1992    Social History   Socioeconomic History  . Marital status: Married    Spouse name: Not on file  . Number of children: Not on file  . Years of education: Not on file  . Highest education level: Not on file  Social Needs  . Financial resource strain: Not on file  . Food insecurity - worry: Not on file  . Food insecurity - inability: Not on file  . Transportation needs - medical: Not on file  . Transportation needs - non-medical: Not on file  Occupational History  . Not on file  Tobacco Use  . Smoking status: Never Smoker  . Smokeless tobacco: Never Used  Substance and Sexual Activity  . Alcohol use: No  . Drug use: No  . Sexual activity: Not on file  Other Topics Concern  . Not on file  Social History Narrative  . Not on file    Family History  Problem Relation Age of Onset  . Diabetes Sister   . Lung cancer Brother   . Diabetes Brother   . Basal cell carcinoma Daughter   . Breast cancer Paternal Aunt      Current Outpatient Medications:  .  albuterol (PROVENTIL HFA;VENTOLIN HFA) 108 (90  Base) MCG/ACT inhaler, Inhale 2 puffs into the lungs every 6 (six) hours as needed for wheezing or shortness of breath., Disp: 1 Inhaler, Rfl: 2 .  alendronate (FOSAMAX) 70 MG tablet, Take 70 mg by mouth once a week. Take with a full glass of water on an empty stomach., Disp: , Rfl:  .  chlorpheniramine-HYDROcodone (TUSSIONEX) 10-8 MG/5ML SUER, Take 5 mLs by mouth every 12 (twelve) hours as needed for cough., Disp: 240 mL, Rfl: 0 .  Cholecalciferol (VITAMIN D-3) 1000 units CAPS, Take 1,000 Units by mouth daily., Disp: , Rfl:  .  HYDROcodone-acetaminophen (NORCO/VICODIN) 5-325 MG tablet, Take 1-2 tablets every 6 (six) hours as needed by mouth for moderate pain., Disp: 30 tablet, Rfl: 0 .  Multiple Vitamins-Minerals (CENTRUM SILVER PO), Take 1 tablet by mouth daily., Disp: , Rfl:  .  ondansetron (ZOFRAN-ODT) 8 MG disintegrating tablet, Take 1 tablet (8 mg total) by mouth every 8 (eight) hours as needed for nausea or vomiting., Disp: 30 tablet, Rfl: 2 .  pantoprazole (PROTONIX) 20 MG tablet, Take 1 tablet by mouth daily., Disp: , Rfl:  .  lidocaine-prilocaine (EMLA) cream, Apply 1 application as needed topically., Disp: 30 g, Rfl: 0 .  simvastatin (ZOCOR) 20 MG tablet, Take 1 tablet by mouth at bedtime. , Disp: , Rfl:  .  tiZANidine (ZANAFLEX) 2 MG tablet, Take 1 tablet by mouth at bedtime as needed. , Disp: , Rfl:  .  traMADol-acetaminophen (ULTRACET) 37.5-325 MG tablet, Take 1 tablet by mouth every 8 (eight) hours as needed. , Disp: , Rfl:  No current facility-administered medications for this visit.   Facility-Administered Medications Ordered in Other Visits:  .  yttrium-90 injection 32.9 millicurie, 92.4 millicurie, Intravenous, Once, Chrystal, Eulas Post, MD  Physical exam:  Vitals:   09/13/17 1114  BP: 116/76  Pulse: (!) 106  Resp: 18  Temp: (!) 97.2 F (36.2 C)  TempSrc: Tympanic  Weight: 110 lb (49.9 kg)   Physical Exam  Constitutional: She is oriented to person, place, and time and  well-developed, well-nourished, and in no distress.  HENT:  Head: Normocephalic and atraumatic.  Eyes: Pupils are equal, round, and reactive to light.  Neck: Normal range of motion. Neck supple.  Cardiovascular: Normal rate and regular rhythm.  Pulmonary/Chest: Effort normal and breath sounds normal. Right breast exhibits skin change and tenderness. Breasts are asymmetrical.  Abdominal: Soft. Bowel sounds are normal.  Musculoskeletal: Normal range of motion. She exhibits tenderness.       Thoracic back: She exhibits tenderness, bony tenderness, swelling and pain.  Lymphadenopathy:    She has axillary adenopathy.       Right axillary: Lateral adenopathy present.  Neurological: She is alert and oriented to person, place, and time.  Skin: Skin is warm and dry.     CMP Latest Ref Rng & Units 09/10/2017  Glucose 65 - 99 mg/dL 93  BUN 6 - 20 mg/dL 15  Creatinine  0.44 - 1.00 mg/dL 0.72  Sodium 135 - 145 mmol/L 138  Potassium 3.5 - 5.1 mmol/L 3.8  Chloride 101 - 111 mmol/L 103  CO2 22 - 32 mmol/L 27  Calcium 8.9 - 10.3 mg/dL 9.4  Total Protein 6.5 - 8.1 g/dL 6.0(L)  Total Bilirubin 0.3 - 1.2 mg/dL 0.5  Alkaline Phos 38 - 126 U/L 68  AST 15 - 41 U/L 24  ALT 14 - 54 U/L 14   CBC Latest Ref Rng & Units 09/10/2017  WBC 3.6 - 11.0 K/uL 2.9(L)  Hemoglobin 12.0 - 16.0 g/dL 11.7(L)  Hematocrit 35.0 - 47.0 % 35.0  Platelets 150 - 440 K/uL 213    No images are attached to the encounter.  Ct Soft Tissue Neck W Contrast  Result Date: 09/12/2017 CLINICAL DATA:  Grade 1 follicular lymphoma EXAM: CT NECK WITH CONTRAST TECHNIQUE: Multidetector CT imaging of the neck was performed using the standard protocol following the bolus administration of intravenous contrast. CONTRAST:  145mL ISOVUE-300 IOPAMIDOL (ISOVUE-300) INJECTION 61% COMPARISON:  PET 04/19/2017 FINDINGS: Pharynx and larynx: Normal. No mass or swelling. Salivary glands: Negative Thyroid: Negative Lymph nodes: 10 mm right supraclavicular  node has developed since the prior PET scan. Left supraclavicular lymph nodes measuring 14 mm and 11 mm compatible with lymphoma. Progression of disease since the prior PET. Superior mediastinal and pleural tumor in the right medial apex has progressed. See chest CT report from today. Upper cervical nodes are not enlarged. Vascular: Negative Limited intracranial: Negative Visualized orbits: Negative Mastoids and visualized paranasal sinuses: Negative Skeleton: Degenerative change and spurring in the cervical spine. No acute skeletal lesion Upper chest: Chest CT from today reported separately Other: None IMPRESSION: Progression of bilateral supraclavicular adenopathy. Progression of mediastinal adenopathy. Chest CT from today reported separately. Electronically Signed   By: Franchot Gallo M.D.   On: 09/12/2017 15:06   Ct Chest W Contrast  Result Date: 09/12/2017 CLINICAL DATA:  Patient with history of lymphoma. Right shoulder bloody pain. EXAM: CT CHEST, ABDOMEN, AND PELVIS WITH CONTRAST TECHNIQUE: Multidetector CT imaging of the chest, abdomen and pelvis was performed following the standard protocol during bolus administration of intravenous contrast. CONTRAST:  181mL ISOVUE-300 IOPAMIDOL (ISOVUE-300) INJECTION 61% COMPARISON:  Chest CT 08/06/2017 ; PET-CT 04/19/2017. CT CAP 06/11/2017. FINDINGS: CT CHEST FINDINGS Cardiovascular: Normal heart size. Aorta and main pulmonary artery are normal in caliber. Calcified thoracic aortic plaque. Mediastinum/Nodes: Overall there has been interval decrease in size of left axillary adenopathy, mediastinal and hilar adenopathy as well as internal mammary chain adenopathy. Reference left axillary lymph node measures 9 mm (image 18; series 4), previously 11 mm. Reference left internal mammary lymph node measures 1.6 cm (image 22; series 4), previously 2.0 cm. Reference right internal mammary chain node measures 3.6 cm (image 35; series 4), previously 4.0 cm. Bulky centrally  necrotic right axillary lymph node measures 4.3 x 3.6 cm (image 30; series 4), previously 5.1 x 4.1 cm. There is increased irregular soft tissue throughout the right breast parenchyma measuring 8.9 x 4.0 cm (image 41; series 4), previously 2.9 x 5.3 cm. Additionally there is suggestion of skin thickening overlying the right breast and multiple additional new breast and subcutaneous nodules (image 45; series 4). Reference nodule within the medial right breast measures 1.5 cm (image 43; series 4). Additionally there is a lateral right breast mass measuring 3.0 cm (image 30; series 4), previously 2.2 cm. Interval decrease in T5-T7 paraspinal adenopathy measuring 1.1 cm (image 23; series 4), previously 1.5  cm. Lungs/Pleura: Central airways are patent. Interval decrease in size of small right pleural effusion. Slight interval decrease in size of left upper lobe nodule measuring 3 mm (image 21; series 7), previously 6 mm. There is a 10 mm subpleural nodule right lower lobe (image 30; series 4). Additional small subpleural nodules throughout the right hemithorax which appear decreased from prior. Musculoskeletal: No aggressive or acute appearing osseous lesions. CT ABDOMEN PELVIS FINDINGS Hepatobiliary: The liver is normal in size and contour. No focal hepatic lesion is identified. Gallbladder is unremarkable. No intrahepatic or extrahepatic biliary ductal dilatation. Pancreas: Unremarkable Spleen: Unremarkable Adrenals/Urinary Tract: Normal adrenal glands. Kidneys enhance symmetrically with contrast. No hydronephrosis. Urinary bladder is unremarkable. Stomach/Bowel: No abnormal bowel wall thickening or evidence for bowel obstruction. No free fluid or free intraperitoneal air. Normal morphology of the stomach. Vascular/Lymphatic: Normal caliber abdominal aorta. Peripheral calcified atherosclerotic plaque. No retroperitoneal lymphadenopathy. Reproductive: Uterus and adnexal structures are unremarkable. Other: None.  Musculoskeletal: Left hip arthroplasty. Lumbar spine degenerative changes. No aggressive or acute appearing osseous lesions. IMPRESSION: 1. When compared to prior chest CT 08/06/2017 there has been interval decrease in mediastinal, supraclavicular, axillary and paraspinal adenopathy. 2. There has been interval increase in size of dominant soft tissue mass within the right breast with overlying skin thickening. Additionally there is suggestion of multiple new/ enlarging nodules within the right breast. While this soft tissue mass may potentially be related to the known lymphoma, the possibility of a breast malignancy is not excluded given the fact the right breast masses have increased in size while the remainder of the soft tissue masses throughout the chest and mediastinum have decreased in size. Electronically Signed   By: Lovey Newcomer M.D.   On: 09/12/2017 19:42   Ct Abdomen Pelvis W Contrast  Result Date: 09/12/2017 CLINICAL DATA:  Patient with history of lymphoma. Right shoulder bloody pain. EXAM: CT CHEST, ABDOMEN, AND PELVIS WITH CONTRAST TECHNIQUE: Multidetector CT imaging of the chest, abdomen and pelvis was performed following the standard protocol during bolus administration of intravenous contrast. CONTRAST:  150mL ISOVUE-300 IOPAMIDOL (ISOVUE-300) INJECTION 61% COMPARISON:  Chest CT 08/06/2017 ; PET-CT 04/19/2017. CT CAP 06/11/2017. FINDINGS: CT CHEST FINDINGS Cardiovascular: Normal heart size. Aorta and main pulmonary artery are normal in caliber. Calcified thoracic aortic plaque. Mediastinum/Nodes: Overall there has been interval decrease in size of left axillary adenopathy, mediastinal and hilar adenopathy as well as internal mammary chain adenopathy. Reference left axillary lymph node measures 9 mm (image 18; series 4), previously 11 mm. Reference left internal mammary lymph node measures 1.6 cm (image 22; series 4), previously 2.0 cm. Reference right internal mammary chain node measures 3.6 cm  (image 35; series 4), previously 4.0 cm. Bulky centrally necrotic right axillary lymph node measures 4.3 x 3.6 cm (image 30; series 4), previously 5.1 x 4.1 cm. There is increased irregular soft tissue throughout the right breast parenchyma measuring 8.9 x 4.0 cm (image 41; series 4), previously 2.9 x 5.3 cm. Additionally there is suggestion of skin thickening overlying the right breast and multiple additional new breast and subcutaneous nodules (image 45; series 4). Reference nodule within the medial right breast measures 1.5 cm (image 43; series 4). Additionally there is a lateral right breast mass measuring 3.0 cm (image 30; series 4), previously 2.2 cm. Interval decrease in T5-T7 paraspinal adenopathy measuring 1.1 cm (image 23; series 4), previously 1.5 cm. Lungs/Pleura: Central airways are patent. Interval decrease in size of small right pleural effusion. Slight interval decrease in size of  left upper lobe nodule measuring 3 mm (image 21; series 7), previously 6 mm. There is a 10 mm subpleural nodule right lower lobe (image 30; series 4). Additional small subpleural nodules throughout the right hemithorax which appear decreased from prior. Musculoskeletal: No aggressive or acute appearing osseous lesions. CT ABDOMEN PELVIS FINDINGS Hepatobiliary: The liver is normal in size and contour. No focal hepatic lesion is identified. Gallbladder is unremarkable. No intrahepatic or extrahepatic biliary ductal dilatation. Pancreas: Unremarkable Spleen: Unremarkable Adrenals/Urinary Tract: Normal adrenal glands. Kidneys enhance symmetrically with contrast. No hydronephrosis. Urinary bladder is unremarkable. Stomach/Bowel: No abnormal bowel wall thickening or evidence for bowel obstruction. No free fluid or free intraperitoneal air. Normal morphology of the stomach. Vascular/Lymphatic: Normal caliber abdominal aorta. Peripheral calcified atherosclerotic plaque. No retroperitoneal lymphadenopathy. Reproductive: Uterus and  adnexal structures are unremarkable. Other: None. Musculoskeletal: Left hip arthroplasty. Lumbar spine degenerative changes. No aggressive or acute appearing osseous lesions. IMPRESSION: 1. When compared to prior chest CT 08/06/2017 there has been interval decrease in mediastinal, supraclavicular, axillary and paraspinal adenopathy. 2. There has been interval increase in size of dominant soft tissue mass within the right breast with overlying skin thickening. Additionally there is suggestion of multiple new/ enlarging nodules within the right breast. While this soft tissue mass may potentially be related to the known lymphoma, the possibility of a breast malignancy is not excluded given the fact the right breast masses have increased in size while the remainder of the soft tissue masses throughout the chest and mediastinum have decreased in size. Electronically Signed   By: Lovey Newcomer M.D.   On: 09/12/2017 19:42     Assessment and plan- Patient is a 71 y.o. female who presents with worsening thoracic back pain over the course of the past month. Patient's CT scan was moved up by Dr. Grayland Ormond due to the worsening pain. It unfortunately showed an increase in size of dominant soft tissue mass within the right breast with overlying skin thickening. It also suggested multiple new/ enlarging nodules within the right breast. It showed progression of bilateral supraclavicular adenopathy and progression of mediastinal adenopathy. On assessment, the patient is sitting with daughter and son cancerating.  On examination she is in no acute distress. Lungs are clear bilaterally. She is mildly tachycardic but with normal rhythm. Right axillary adenopathy noted. Right breast swollen but not hot to touch. Thoracic spine examined. Tenderness noted at T3-T4. Possibly mildly edematous. No obvious deformity.  1. Thoracic back pain: Adjusted dose of Norco from 1 tablet 5-325 every 6 hours to 1-2 tablets every 6 hours PRN for pain.  Prescription given. Asked her to call if this did not help. She was not open to trying any additional long-term analgesics.  2. Progressive Disease: Spoke with Dr. Grayland Ormond about patient options for treatment given her CT scan showed progressive disease. He recommended R-CHOP as the next therapy for this patient. He asked that I initiate the conversation and provide information so that they can make a well informed decision. Patient and family our on board to try chemotherapy. Patient will need port placement, chemotherapy education, MUGA scan and labs prior to initiation. This was all scheduled and patient will return Thursday for labs and to see Dr. Grayland Ormond and Friday for cycle 1 of R-CHOP.     Visit Diagnosis 1. 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 Acute midline thoracic back pain     Patient expressed understanding and was in agreement with this plan.  She also understands that She can call clinic at any time with any questions, concerns, or complaints.   Greater than 50% was spent in counseling and coordination of care with this patient including but not limited to discussion of the relevant topics above (See A&P) including, but not limited to diagnosis and management of acute and chronic medical conditions.    Faythe Casa, AGNP-C [facility]- [phone] Pager- [phone] 09/13/2017 4:02 PM

## 2017-09-13 NOTE — Telephone Encounter (Signed)
CT ABDOMINAL PELVIS AND CHEST  IMPRESSION: 1. When compared to prior chest CT 08/06/2017 there has been interval decrease in mediastinal, supraclavicular, axillary and paraspinal adenopathy. 2. There has been interval increase in size of dominant soft tissue mass within the right breast with overlying skin thickening. Additionally there is suggestion of multiple new/ enlarging nodules within the right breast. While this soft tissue mass may potentially be related to the known lymphoma, the possibility of a breast malignancy is not excluded given the fact the right breast masses have increased in size while the remainder of the soft tissue masses throughout the chest and mediastinum have decreased in size.   Electronically Signed   By: Lovey Newcomer M.D.   On: 09/12/2017 19:42  CT NECK  IMPRESSION: Progression of bilateral supraclavicular adenopathy. Progression of mediastinal adenopathy. Chest CT from today reported separately.   Electronically Signed   By: Franchot Gallo M.D.   On: 09/12/2017 15:06

## 2017-09-13 NOTE — Progress Notes (Signed)
Acute add on today for pain. Patient states that she has been having severe pain in her right shoulder for several weeks, which has progressively gotten worse. She states that she has tried heat, ice, rubs, pain pills and absolutely nothing helps. She is miserable.

## 2017-09-13 NOTE — Telephone Encounter (Signed)
Robin informed and accepted an appointment for 11 AM today

## 2017-09-16 ENCOUNTER — Telehealth: Payer: Self-pay | Admitting: *Deleted

## 2017-09-16 ENCOUNTER — Ambulatory Visit
Admission: RE | Admit: 2017-09-16 | Discharge: 2017-09-16 | Disposition: A | Discharge status: Home / Self Care | Payer: Medicare Other | Source: Ambulatory Visit | Attending: Vascular Surgery | Admitting: Vascular Surgery

## 2017-09-16 ENCOUNTER — Other Ambulatory Visit (INDEPENDENT_AMBULATORY_CARE_PROVIDER_SITE_OTHER): Payer: Self-pay | Admitting: Vascular Surgery

## 2017-09-16 ENCOUNTER — Encounter
Admission: RE | Disposition: A | Discharge status: Home / Self Care | Payer: Self-pay | Source: Ambulatory Visit | Attending: Vascular Surgery

## 2017-09-16 DIAGNOSIS — Z833 Family history of diabetes mellitus: Secondary | ICD-10-CM | POA: Insufficient documentation

## 2017-09-16 DIAGNOSIS — E785 Hyperlipidemia, unspecified: Secondary | ICD-10-CM | POA: Diagnosis not present

## 2017-09-16 DIAGNOSIS — Z79899 Other long term (current) drug therapy: Secondary | ICD-10-CM | POA: Insufficient documentation

## 2017-09-16 DIAGNOSIS — Z86010 Personal history of colonic polyps: Secondary | ICD-10-CM | POA: Diagnosis not present

## 2017-09-16 DIAGNOSIS — Z9842 Cataract extraction status, left eye: Secondary | ICD-10-CM | POA: Diagnosis not present

## 2017-09-16 DIAGNOSIS — Z96642 Presence of left artificial hip joint: Secondary | ICD-10-CM | POA: Diagnosis not present

## 2017-09-16 DIAGNOSIS — Z9841 Cataract extraction status, right eye: Secondary | ICD-10-CM | POA: Diagnosis not present

## 2017-09-16 DIAGNOSIS — Z803 Family history of malignant neoplasm of breast: Secondary | ICD-10-CM | POA: Diagnosis not present

## 2017-09-16 DIAGNOSIS — Z801 Family history of malignant neoplasm of trachea, bronchus and lung: Secondary | ICD-10-CM | POA: Insufficient documentation

## 2017-09-16 DIAGNOSIS — M810 Age-related osteoporosis without current pathological fracture: Secondary | ICD-10-CM | POA: Diagnosis not present

## 2017-09-16 DIAGNOSIS — M546 Pain in thoracic spine: Secondary | ICD-10-CM | POA: Diagnosis not present

## 2017-09-16 DIAGNOSIS — Z808 Family history of malignant neoplasm of other organs or systems: Secondary | ICD-10-CM | POA: Diagnosis not present

## 2017-09-16 DIAGNOSIS — Z9889 Other specified postprocedural states: Secondary | ICD-10-CM | POA: Diagnosis not present

## 2017-09-16 DIAGNOSIS — H269 Unspecified cataract: Secondary | ICD-10-CM | POA: Insufficient documentation

## 2017-09-16 DIAGNOSIS — Z8619 Personal history of other infectious and parasitic diseases: Secondary | ICD-10-CM | POA: Diagnosis not present

## 2017-09-16 DIAGNOSIS — C8208 Follicular lymphoma grade I, lymph nodes of multiple sites: Secondary | ICD-10-CM

## 2017-09-16 HISTORY — PX: PORTA CATH INSERTION: CATH118285

## 2017-09-16 SURGERY — PORTA CATH INSERTION
Anesthesia: Moderate Sedation

## 2017-09-16 MED ORDER — HYDROMORPHONE HCL 1 MG/ML IJ SOLN
INTRAMUSCULAR | Status: AC
  Filled 2017-09-16: qty 0.5

## 2017-09-16 MED ORDER — MIDAZOLAM HCL 2 MG/2ML IJ SOLN
INTRAMUSCULAR | Status: AC
  Filled 2017-09-16: qty 4

## 2017-09-16 MED ORDER — SODIUM CHLORIDE 0.9 % IV SOLN
INTRAVENOUS | Status: DC
  Administered 2017-09-16: 15:00:00 via INTRAVENOUS

## 2017-09-16 MED ORDER — MIDAZOLAM HCL 2 MG/2ML IJ SOLN
INTRAMUSCULAR | Status: DC | PRN
  Administered 2017-09-16 (×2): 1 mg via INTRAVENOUS
  Administered 2017-09-16: 2 mg

## 2017-09-16 MED ORDER — CEFAZOLIN SODIUM-DEXTROSE 2-4 GM/100ML-% IV SOLN: 2.0000 g | Freq: Once | INTRAVENOUS | Status: DC

## 2017-09-16 MED ORDER — LIDOCAINE-EPINEPHRINE (PF) 1 %-1:200000 IJ SOLN
INTRAMUSCULAR | Status: AC
  Filled 2017-09-16: qty 30

## 2017-09-16 MED ORDER — DEXTROSE 5 % IV SOLN
1000.0000 mg | Freq: Once | INTRAVENOUS | Status: AC
  Administered 2017-09-16: 1000 mg via INTRAVENOUS

## 2017-09-16 MED ORDER — HYDROMORPHONE HCL 1 MG/ML IJ SOLN
1.0000 mg | Freq: Once | INTRAMUSCULAR | Status: AC | PRN
  Administered 2017-09-16: 1 mg via INTRAVENOUS

## 2017-09-16 MED ORDER — FENTANYL CITRATE (PF) 100 MCG/2ML IJ SOLN
INTRAMUSCULAR | Status: AC
  Filled 2017-09-16: qty 2

## 2017-09-16 MED ORDER — HEPARIN (PORCINE) IN NACL 2-0.9 UNIT/ML-% IJ SOLN
INTRAMUSCULAR | Status: AC
  Filled 2017-09-16: qty 500

## 2017-09-16 MED ORDER — ONDANSETRON HCL 4 MG/2ML IJ SOLN: 4.0000 mg | Freq: Four times a day (QID) | INTRAMUSCULAR | Status: DC | PRN

## 2017-09-16 MED ORDER — FENTANYL CITRATE (PF) 100 MCG/2ML IJ SOLN
INTRAMUSCULAR | Status: DC | PRN
  Administered 2017-09-16 (×2): 25 ug via INTRAVENOUS
  Administered 2017-09-16: 50 ug

## 2017-09-16 MED ORDER — SODIUM CHLORIDE 0.9 % IR SOLN
Freq: Once | Status: DC
  Filled 2017-09-16: qty 2

## 2017-09-16 MED ORDER — MORPHINE SULFATE ER 15 MG PO TBCR: 15.0000 mg | EXTENDED_RELEASE_TABLET | Freq: Two times a day (BID) | ORAL | 0 refills | Status: DC

## 2017-09-16 SURGICAL SUPPLY — 9 items
DERMABOND ADVANCED (GAUZE/BANDAGES/DRESSINGS) ×1
DERMABOND ADVANCED .7 DNX12 (GAUZE/BANDAGES/DRESSINGS) ×1 IMPLANT
KIT PORT POWER 8FR ISP CVUE (Miscellaneous) ×2 IMPLANT
PACK ANGIOGRAPHY (CUSTOM PROCEDURE TRAY) ×2 IMPLANT
PAD GROUND ADULT SPLIT (MISCELLANEOUS) ×2 IMPLANT
PENCIL ELECTRO HAND CTR (MISCELLANEOUS) ×2 IMPLANT
SUT MNCRL AB 4-0 PS2 18 (SUTURE) ×2 IMPLANT
SUT PROLENE 0 CT 1 30 (SUTURE) ×2 IMPLANT
SUTURE VIC 3-0 (SUTURE) ×2 IMPLANT

## 2017-09-16 NOTE — Addendum Note (Signed)
Addended by: Faythe Casa E on: 09/16/2017 10:12 AM   Modules accepted: Orders

## 2017-09-16 NOTE — Telephone Encounter (Signed)
Veronica Boyd is taking care of this.

## 2017-09-16 NOTE — Patient Instructions (Signed)
Rituximab injection What is this medicine? RITUXIMAB (ri TUX i mab) is a monoclonal antibody. It is used to treat certain types of cancer like non-Hodgkin lymphoma and chronic lymphocytic leukemia. It is also used to treat rheumatoid arthritis, granulomatosis with polyangiitis (or Wegener's granulomatosis), and microscopic polyangiitis. This medicine may be used for other purposes; ask your health care provider or pharmacist if you have questions. COMMON BRAND NAME(S): Rituxan What should I tell my health care provider before I take this medicine? They need to know if you have any of these conditions: -heart disease -infection (especially a virus infection such as hepatitis B, chickenpox, cold sores, or herpes) -immune system problems -irregular heartbeat -kidney disease -lung or breathing disease, like asthma -recently received or scheduled to receive a vaccine -an unusual or allergic reaction to rituximab, mouse proteins, other medicines, foods, dyes, or preservatives -pregnant or trying to get pregnant -breast-feeding How should I use this medicine? This medicine is for infusion into a vein. It is administered in a hospital or clinic by a specially trained health care professional. A special MedGuide will be given to you by the pharmacist with each prescription and refill. Be sure to read this information carefully each time. Talk to your pediatrician regarding the use of this medicine in children. This medicine is not approved for use in children. Overdosage: If you think you have taken too much of this medicine contact a poison control center or emergency room at once. NOTE: This medicine is only for you. Do not share this medicine with others. What if I miss a dose? It is important not to miss a dose. Call your doctor or health care professional if you are unable to keep an appointment. What may interact with this medicine? -cisplatin -other medicines for arthritis like disease  modifying antirheumatic drugs or tumor necrosis factor inhibitors -live virus vaccines This list may not describe all possible interactions. Give your health care provider a list of all the medicines, herbs, non-prescription drugs, or dietary supplements you use. Also tell them if you smoke, drink alcohol, or use illegal drugs. Some items may interact with your medicine. What should I watch for while using this medicine? Your condition will be monitored carefully while you are receiving this medicine. You may need blood work done while you are taking this medicine. This medicine can cause serious allergic reactions. To reduce your risk you may need to take medicine before treatment with this medicine. Take your medicine as directed. In some patients, this medicine may cause a serious brain infection that may cause death. If you have any problems seeing, thinking, speaking, walking, or standing, tell your doctor right away. If you cannot reach your doctor, urgently seek other source of medical care. Call your doctor or health care professional for advice if you get a fever, chills or sore throat, or other symptoms of a cold or flu. Do not treat yourself. This drug decreases your body's ability to fight infections. Try to avoid being around people who are sick. Do not become pregnant while taking this medicine or for 12 months after stopping it. Women should inform their doctor if they wish to become pregnant or think they might be pregnant. There is a potential for serious side effects to an unborn child. Talk to your health care professional or pharmacist for more information. What side effects may I notice from receiving this medicine? Side effects that you should report to your doctor or health care professional as soon as possible: -breathing   problems -chest pain -dizziness or feeling faint -fast, irregular heartbeat -low blood counts - this medicine may decrease the number of white blood cells,  red blood cells and platelets. You may be at increased risk for infections and bleeding. -mouth sores -redness, blistering, peeling or loosening of the skin, including inside the mouth (this can be added for any serious or exfoliative rash that could lead to hospitalization) -signs of infection - fever or chills, cough, sore throat, pain or difficulty passing urine -signs and symptoms of kidney injury like trouble passing urine or change in the amount of urine -signs and symptoms of liver injury like dark yellow or brown urine; general ill feeling or flu-like symptoms; light-colored stools; loss of appetite; nausea; right upper belly pain; unusually weak or tired; yellowing of the eyes or skin -stomach pain -vomiting Side effects that usually do not require medical attention (report to your doctor or health care professional if they continue or are bothersome): -headache -joint pain -muscle cramps or muscle pain This list may not describe all possible side effects. Call your doctor for medical advice about side effects. You may report side effects to FDA at 1-800-FDA-1088. Where should I keep my medicine? This drug is given in a hospital or clinic and will not be stored at home. NOTE: This sheet is a summary. It may not cover all possible information. If you have questions about this medicine, talk to your doctor, pharmacist, or health care provider.  2018 Elsevier/Gold Standard (2016-05-30 15:28:09) Doxorubicin injection What is this medicine? DOXORUBICIN (dox oh ROO bi sin) is a chemotherapy drug. It is used to treat many kinds of cancer like leukemia, lymphoma, neuroblastoma, sarcoma, and Wilms' tumor. It is also used to treat bladder cancer, breast cancer, lung cancer, ovarian cancer, stomach cancer, and thyroid cancer. This medicine may be used for other purposes; ask your health care provider or pharmacist if you have questions. COMMON BRAND NAME(S): Adriamycin, Adriamycin PFS, Adriamycin  RDF, Rubex What should I tell my health care provider before I take this medicine? They need to know if you have any of these conditions: -heart disease -history of low blood counts caused by a medicine -liver disease -recent or ongoing radiation therapy -an unusual or allergic reaction to doxorubicin, other chemotherapy agents, other medicines, foods, dyes, or preservatives -pregnant or trying to get pregnant -breast-feeding How should I use this medicine? This drug is given as an infusion into a vein. It is administered in a hospital or clinic by a specially trained health care professional. If you have pain, swelling, burning or any unusual feeling around the site of your injection, tell your health care professional right away. Talk to your pediatrician regarding the use of this medicine in children. Special care may be needed. Overdosage: If you think you have taken too much of this medicine contact a poison control center or emergency room at once. NOTE: This medicine is only for you. Do not share this medicine with others. What if I miss a dose? It is important not to miss your dose. Call your doctor or health care professional if you are unable to keep an appointment. What may interact with this medicine? This medicine may interact with the following medications: -6-mercaptopurine -paclitaxel -phenytoin -St. John's Wort -trastuzumab -verapamil This list may not describe all possible interactions. Give your health care provider a list of all the medicines, herbs, non-prescription drugs, or dietary supplements you use. Also tell them if you smoke, drink alcohol, or use illegal drugs.   Some items may interact with your medicine. What should I watch for while using this medicine? This drug may make you feel generally unwell. This is not uncommon, as chemotherapy can affect healthy cells as well as cancer cells. Report any side effects. Continue your course of treatment even though you  feel ill unless your doctor tells you to stop. There is a maximum amount of this medicine you should receive throughout your life. The amount depends on the medical condition being treated and your overall health. Your doctor will watch how much of this medicine you receive in your lifetime. Tell your doctor if you have taken this medicine before. You may need blood work done while you are taking this medicine. Your urine may turn red for a few days after your dose. This is not blood. If your urine is dark or brown, call your doctor. In some cases, you may be given additional medicines to help with side effects. Follow all directions for their use. Call your doctor or health care professional for advice if you get a fever, chills or sore throat, or other symptoms of a cold or flu. Do not treat yourself. This drug decreases your body's ability to fight infections. Try to avoid being around people who are sick. This medicine may increase your risk to bruise or bleed. Call your doctor or health care professional if you notice any unusual bleeding. Talk to your doctor about your risk of cancer. You may be more at risk for certain types of cancers if you take this medicine. Do not become pregnant while taking this medicine or for 6 months after stopping it. Women should inform their doctor if they wish to become pregnant or think they might be pregnant. Men should not father a child while taking this medicine and for 6 months after stopping it. There is a potential for serious side effects to an unborn child. Talk to your health care professional or pharmacist for more information. Do not breast-feed an infant while taking this medicine. This medicine has caused ovarian failure in some women and reduced sperm counts in some men This medicine may interfere with the ability to have a child. Talk with your doctor or health care professional if you are concerned about your fertility. What side effects may I notice  from receiving this medicine? Side effects that you should report to your doctor or health care professional as soon as possible: -allergic reactions like skin rash, itching or hives, swelling of the face, lips, or tongue -breathing problems -chest pain -fast or irregular heartbeat -low blood counts - this medicine may decrease the number of white blood cells, red blood cells and platelets. You may be at increased risk for infections and bleeding. -pain, redness, or irritation at site where injected -signs of infection - fever or chills, cough, sore throat, pain or difficulty passing urine -signs of decreased platelets or bleeding - bruising, pinpoint red spots on the skin, black, tarry stools, blood in the urine -swelling of the ankles, feet, hands -tiredness -weakness Side effects that usually do not require medical attention (report to your doctor or health care professional if they continue or are bothersome): -diarrhea -hair loss -mouth sores -nail discoloration or damage -nausea -red colored urine -vomiting This list may not describe all possible side effects. Call your doctor for medical advice about side effects. You may report side effects to FDA at 1-800-FDA-1088. Where should I keep my medicine? This drug is given in a hospital or clinic   and will not be stored at home. NOTE: This sheet is a summary. It may not cover all possible information. If you have questions about this medicine, talk to your doctor, pharmacist, or health care provider.  2018 Elsevier/Gold Standard (2015-12-19 11:28:51) Cyclophosphamide injection What is this medicine? CYCLOPHOSPHAMIDE (sye kloe FOSS fa mide) is a chemotherapy drug. It slows the growth of cancer cells. This medicine is used to treat many types of cancer like lymphoma, myeloma, leukemia, breast cancer, and ovarian cancer, to name a few. This medicine may be used for other purposes; ask your health care provider or pharmacist if you have  questions. COMMON BRAND NAME(S): Cytoxan, Neosar What should I tell my health care provider before I take this medicine? They need to know if you have any of these conditions: -blood disorders -history of other chemotherapy -infection -kidney disease -liver disease -recent or ongoing radiation therapy -tumors in the bone marrow -an unusual or allergic reaction to cyclophosphamide, other chemotherapy, other medicines, foods, dyes, or preservatives -pregnant or trying to get pregnant -breast-feeding How should I use this medicine? This drug is usually given as an injection into a vein or muscle or by infusion into a vein. It is administered in a hospital or clinic by a specially trained health care professional. Talk to your pediatrician regarding the use of this medicine in children. Special care may be needed. Overdosage: If you think you have taken too much of this medicine contact a poison control center or emergency room at once. NOTE: This medicine is only for you. Do not share this medicine with others. What if I miss a dose? It is important not to miss your dose. Call your doctor or health care professional if you are unable to keep an appointment. What may interact with this medicine? This medicine may interact with the following medications: -amiodarone -amphotericin B -azathioprine -certain antiviral medicines for HIV or AIDS such as protease inhibitors (e.g., indinavir, ritonavir) and zidovudine -certain blood pressure medications such as benazepril, captopril, enalapril, fosinopril, lisinopril, moexipril, monopril, perindopril, quinapril, ramipril, trandolapril -certain cancer medications such as anthracyclines (e.g., daunorubicin, doxorubicin), busulfan, cytarabine, paclitaxel, pentostatin, tamoxifen, trastuzumab -certain diuretics such as chlorothiazide, chlorthalidone, hydrochlorothiazide, indapamide, metolazone -certain medicines that treat or prevent blood clots like  warfarin -certain muscle relaxants such as succinylcholine -cyclosporine -etanercept -indomethacin -medicines to increase blood counts like filgrastim, pegfilgrastim, sargramostim -medicines used as general anesthesia -metronidazole -natalizumab This list may not describe all possible interactions. Give your health care provider a list of all the medicines, herbs, non-prescription drugs, or dietary supplements you use. Also tell them if you smoke, drink alcohol, or use illegal drugs. Some items may interact with your medicine. What should I watch for while using this medicine? Visit your doctor for checks on your progress. This drug may make you feel generally unwell. This is not uncommon, as chemotherapy can affect healthy cells as well as cancer cells. Report any side effects. Continue your course of treatment even though you feel ill unless your doctor tells you to stop. Drink water or other fluids as directed. Urinate often, even at night. In some cases, you may be given additional medicines to help with side effects. Follow all directions for their use. Call your doctor or health care professional for advice if you get a fever, chills or sore throat, or other symptoms of a cold or flu. Do not treat yourself. This drug decreases your body's ability to fight infections. Try to avoid being around people who are sick.  This medicine may increase your risk to bruise or bleed. Call your doctor or health care professional if you notice any unusual bleeding. Be careful brushing and flossing your teeth or using a toothpick because you may get an infection or bleed more easily. If you have any dental work done, tell your dentist you are receiving this medicine. You may get drowsy or dizzy. Do not drive, use machinery, or do anything that needs mental alertness until you know how this medicine affects you. Do not become pregnant while taking this medicine or for 1 year after stopping it. Women should  inform their doctor if they wish to become pregnant or think they might be pregnant. Men should not father a child while taking this medicine and for 4 months after stopping it. There is a potential for serious side effects to an unborn child. Talk to your health care professional or pharmacist for more information. Do not breast-feed an infant while taking this medicine. This medicine may interfere with the ability to have a child. This medicine has caused ovarian failure in some women. This medicine has caused reduced sperm counts in some men. You should talk with your doctor or health care professional if you are concerned about your fertility. If you are going to have surgery, tell your doctor or health care professional that you have taken this medicine. What side effects may I notice from receiving this medicine? Side effects that you should report to your doctor or health care professional as soon as possible: -allergic reactions like skin rash, itching or hives, swelling of the face, lips, or tongue -low blood counts - this medicine may decrease the number of white blood cells, red blood cells and platelets. You may be at increased risk for infections and bleeding. -signs of infection - fever or chills, cough, sore throat, pain or difficulty passing urine -signs of decreased platelets or bleeding - bruising, pinpoint red spots on the skin, black, tarry stools, blood in the urine -signs of decreased red blood cells - unusually weak or tired, fainting spells, lightheadedness -breathing problems -dark urine -dizziness -palpitations -swelling of the ankles, feet, hands -trouble passing urine or change in the amount of urine -weight gain -yellowing of the eyes or skin Side effects that usually do not require medical attention (report to your doctor or health care professional if they continue or are bothersome): -changes in nail or skin color -hair loss -missed menstrual periods -mouth  sores -nausea, vomiting This list may not describe all possible side effects. Call your doctor for medical advice about side effects. You may report side effects to FDA at 1-800-FDA-1088. Where should I keep my medicine? This drug is given in a hospital or clinic and will not be stored at home. NOTE: This sheet is a summary. It may not cover all possible information. If you have questions about this medicine, talk to your doctor, pharmacist, or health care provider.  2018 Elsevier/Gold Standard (2012-09-05 16:22:58) Vincristine injection What is this medicine? VINCRISTINE (vin KRIS teen) is a chemotherapy drug. It slows the growth of cancer cells. This medicine is used to treat many types of cancer like Hodgkin's disease, leukemia, non-Hodgkin's lymphoma, neuroblastoma (brain cancer), rhabdomyosarcoma, and Wilms' tumor. This medicine may be used for other purposes; ask your health care provider or pharmacist if you have questions. COMMON BRAND NAME(S): Oncovin, Vincasar PFS What should I tell my health care provider before I take this medicine? They need to know if you have any of these  conditions: -blood disorders -gout -infection (especially chickenpox, cold sores, or herpes) -kidney disease -liver disease -lung disease -nervous system disease like Charcot-Marie-Tooth (CMT) -recent or ongoing radiation therapy -an unusual or allergic reaction to vincristine, other chemotherapy agents, other medicines, foods, dyes, or preservatives -pregnant or trying to get pregnant -breast-feeding How should I use this medicine? This drug is given as an infusion into a vein. It is administered in a hospital or clinic by a specially trained health care professional. If you have pain, swelling, burning, or any unusual feeling around the site of your injection, tell your health care professional right away. Talk to your pediatrician regarding the use of this medicine in children. While this drug may be  prescribed for selected conditions, precautions do apply. Overdosage: If you think you have taken too much of this medicine contact a poison control center or emergency room at once. NOTE: This medicine is only for you. Do not share this medicine with others. What if I miss a dose? It is important not to miss your dose. Call your doctor or health care professional if you are unable to keep an appointment. What may interact with this medicine? Do not take this medicine with any of the following medications: -itraconazole -mibefradil -voriconazole This medicine may also interact with the following medications: -cyclosporine -erythromycin -fluconazole -ketoconazole -medicines for HIV like delavirdine, efavirenz, nevirapine -medicines for seizures like ethotoin, fosphenotoin, phenytoin -medicines to increase blood counts like filgrastim, pegfilgrastim, sargramostim -other chemotherapy drugs like cisplatin, L-asparaginase, methotrexate, mitomycin, paclitaxel -pegaspargase -vaccines -zalcitabine, ddC Talk to your doctor or health care professional before taking any of these medicines: -acetaminophen -aspirin -ibuprofen -ketoprofen -naproxen This list may not describe all possible interactions. Give your health care provider a list of all the medicines, herbs, non-prescription drugs, or dietary supplements you use. Also tell them if you smoke, drink alcohol, or use illegal drugs. Some items may interact with your medicine. What should I watch for while using this medicine? Your condition will be monitored carefully while you are receiving this medicine. You will need important blood work done while you are taking this medicine. This drug may make you feel generally unwell. This is not uncommon, as chemotherapy can affect healthy cells as well as cancer cells. Report any side effects. Continue your course of treatment even though you feel ill unless your doctor tells you to stop. In some  cases, you may be given additional medicines to help with side effects. Follow all directions for their use. Call your doctor or health care professional for advice if you get a fever, chills or sore throat, or other symptoms of a cold or flu. Do not treat yourself. Avoid taking products that contain aspirin, acetaminophen, ibuprofen, naproxen, or ketoprofen unless instructed by your doctor. These medicines may hide a fever. Do not become pregnant while taking this medicine. Women should inform their doctor if they wish to become pregnant or think they might be pregnant. There is a potential for serious side effects to an unborn child. Talk to your health care professional or pharmacist for more information. Do not breast-feed an infant while taking this medicine. Men may have a lower sperm count while taking this medicine. Talk to your doctor if you plan to father a child. What side effects may I notice from receiving this medicine? Side effects that you should report to your doctor or health care professional as soon as possible: -allergic reactions like skin rash, itching or hives, swelling of the face, lips, or  tongue -breathing problems -confusion or changes in emotions or moods -constipation -cough -mouth sores -muscle weakness -nausea and vomiting -pain, swelling, redness or irritation at the injection site -pain, tingling, numbness in the hands or feet -problems with balance, talking, walking -seizures -stomach pain -trouble passing urine or change in the amount of urine Side effects that usually do not require medical attention (report to your doctor or health care professional if they continue or are bothersome): -diarrhea -hair loss -jaw pain -loss of appetite This list may not describe all possible side effects. Call your doctor for medical advice about side effects. You may report side effects to FDA at 1-800-FDA-1088. Where should I keep my medicine? This drug is given in a  hospital or clinic and will not be stored at home. NOTE: This sheet is a summary. It may not cover all possible information. If you have questions about this medicine, talk to your doctor, pharmacist, or health care provider.  2018 Elsevier/Gold Standard (2008-07-19 17:17:13)

## 2017-09-16 NOTE — Telephone Encounter (Signed)
Veronica Boyd notified of medicine addition and that she has to pick up prescription at office

## 2017-09-16 NOTE — Discharge Instructions (Signed)
Moderate Conscious Sedation, Adult, Care After °These instructions provide you with information about caring for yourself after your procedure. Your health care provider may also give you more specific instructions. Your treatment has been planned according to current medical practices, but problems sometimes occur. Call your health care provider if you have any problems or questions after your procedure. °What can I expect after the procedure? °After your procedure, it is common: °· To feel sleepy for several hours. °· To feel clumsy and have poor balance for several hours. °· To have poor judgment for several hours. °· To vomit if you eat too soon. ° °Follow these instructions at home: °For at least 24 hours after the procedure: ° °· Do not: °? Participate in activities where you could fall or become injured. °? Drive. °? Use heavy machinery. °? Drink alcohol. °? Take sleeping pills or medicines that cause drowsiness. °? Make important decisions or sign legal documents. °? Take care of children on your own. °· Rest. °Eating and drinking °· Follow the diet recommended by your health care provider. °· If you vomit: °? Drink water, juice, or soup when you can drink without vomiting. °? Make sure you have little or no nausea before eating solid foods. °General instructions °· Have a responsible adult stay with you until you are awake and alert. °· Take over-the-counter and prescription medicines only as told by your health care provider. °· If you smoke, do not smoke without supervision. °· Keep all follow-up visits as told by your health care provider. This is important. °Contact a health care provider if: °· You keep feeling nauseous or you keep vomiting. °· You feel light-headed. °· You develop a rash. °· You have a fever. °Get help right away if: °· You have trouble breathing. °This information is not intended to replace advice given to you by your health care provider. Make sure you discuss any questions you have  with your health care provider. °Document Released: 08/12/2013 Document Revised: 03/26/2016 Document Reviewed: 02/11/2016 °[employer]vier Interactive Patient Education © 2018 [employer] °Tunneled Catheter Insertion, Care After °Refer to this sheet in the next few weeks. These instructions provide you with information about caring for yourself after your procedure. Your health care provider may also give you more specific instructions. Your treatment has been planned according to current medical practices, but problems sometimes occur. Call your health care provider if you have any problems or questions after your procedure. °What can I expect after the procedure? °After the procedure, it is common to have: °· Some mild redness, swelling, and pain around your catheter site. °· A small amount of blood or clear fluid coming from your incisions. ° °Follow these instructions at home: °Incision care °· Check your incision areas every day for signs of infection. Check for: °? More redness, swelling, or pain. °? More fluid or blood. °? Warmth. °? Pus or a bad smell. °· Follow instructions from your health care provider about how to take care of your incisions. Make sure you: °? Wash your hands with soap and water before you change your bandages (dressings). If soap and water are not available, use hand sanitizer. °? Change your dressings as told by your health care provider. Wash the area around your incisions with a germ-killing (antiseptic) solution when you change your dressing, as told by your health care provider. °? Leave stitches (sutures), skin glue, or adhesive strips in place. These skin closures may need to stay in place for 2 weeks or   longer. If adhesive strip edges start to loosen and curl up, you may trim the loose edges. Do not remove adhesive strips completely unless your health care provider tells you to do that. °Catheter Care ° °· Wash your hands with soap and water before and after caring for your catheter.  If soap and water are not available, use hand sanitizer. °· Keep your catheter site and your dressings clean and dry. °· Apply an antibiotic ointment to your catheter site as told by your health care provider. °· Flush your catheter as told by your health care provider. This helps prevent it from becoming clogged. °· Do not open the caps on the ends of the catheter. °· Do not pull on your catheter. °· If your catheter is in your arm: °? Avoid wearing tight clothes or tight jewelry on your arm that has the catheter. °? Do not sleep with your head on the arm that has the catheter. °? Do not allow your blood pressure to be taken on the arm that has the catheter. °? Do not allow your blood to be drawn from the arm that has the catheter, except through the catheter itself. °Medicines °· Take over-the-counter and prescription medicines only as told by your health care provider. °· If you were prescribed an antibiotic medicine, take it as told by your health care provider. Do not stop taking the antibiotic even if you start to feel better. °Activity °· Return to your normal activities as told by your health care provider. Ask your health care provider what activities are safe for you. °· Do not lift anything that is heavier than 10 lb (4.5 kg) for 3 weeks or as long as told by your health care provider. °Driving °· Do not drive until your health care provider approves. °· Do not drive or operate heavy machinery while taking prescription pain medicine. °General instructions °· Follow your health care provider's specific instructions for the type of catheter that you have. °· Do not take baths, swim, or use a hot tub until your health care provider approves. °· Follow instructions from your health care provider about eating or drinking restrictions. °· Wear compression stockings as told by your health care provider. These stockings help to prevent blood clots and reduce swelling in your legs. °· Keep all follow-up visits as  told by your health care provider. This is important. °Contact a health care provider if: °· You have more fluid or blood coming from your incisions. °· You have more redness, swelling, or pain at your incisions or around the area where your catheter is inserted. °· Your incisions feel warm to the touch. °· You feel unusually weak. °· You feel nauseous. °· Your catheter is not working properly. °· You have blood or fluid draining from your catheter. °· You are unable to flush your catheter. °Get help right away if: °· Your catheter breaks. °· A hole develops in your catheter. °· Your catheter comes loose or gets pulled completely out. If this happens, press on your catheter site firmly with your hand or a clean cloth until you get medical help. °· Your catheter becomes blocked. °· You have swelling in your arm, shoulder, neck, or face. °· You develop chest pain. °· You have difficulty breathing. °· You feel dizzy or light-headed. °· You have pus or a bad smell coming from your incisions. °· You have a fever. °· You develop bleeding from your catheter or your insertion site, and your bleeding does not   stop. °This information is not intended to replace advice given to you by your health care provider. Make sure you discuss any questions you have with your health care provider. °Document Released: 10/08/2012 Document Revised: 06/24/2016 Document Reviewed: 07/18/2015 °Elsevier Interactive Patient Education © 2018 [employer] ° °

## 2017-09-16 NOTE — H&P (Signed)
[facility] History & Physical Update  The patient was interviewed and re-examined.  The patient's previous History and Physical has been reviewed and is unchanged.  There is no change in the plan of care. We plan to proceed with the scheduled procedure.  Leotis Pain, MD  09/16/2017, 4:03 PM

## 2017-09-16 NOTE — Telephone Encounter (Signed)
15 mg MS Contin q 12 added.

## 2017-09-16 NOTE — Telephone Encounter (Signed)
Veronica Boyd called and stated that her mother is in horrible pain and the Norco is not helping even giving her 2 tabs at a time. States she needs something else. Please advise

## 2017-09-16 NOTE — Op Note (Signed)
      [facility]       Operative Note  Date: 09/16/2017  Preoperative diagnosis:  1. lymphoma  Postoperative diagnosis:  Same as above  Procedures: #1. Ultrasound guidance for vascular access to the right internal jugular vein. #2. Fluoroscopic guidance for placement of catheter. #3. Placement of CT compatible Port-A-Cath, right internal jugular vein.  Surgeon: Leotis Pain, MD.   Anesthesia: Local with moderate conscious sedation for approximately 20  minutes using 4 mg of Versed and 100 mcg of Fentanyl  Fluoroscopy time: less than 1 minute  Contrast used: 0  Estimated blood loss: 5 cc  Indication for the procedure:  The patient is a 71 y.o.female with lymphoma.  The patient needs a Port-A-Cath for durable venous access, chemotherapy, lab draws, and CT scans. We are asked to place this. Risks and benefits were discussed and informed consent was obtained.  Description of procedure: The patient was brought to the vascular and interventional radiology suite.  Moderate conscious sedation was administered throughout the procedure during a face to face encounter with the patient with my supervision of the RN administering medicines and monitoring the patient's vital signs, pulse oximetry, telemetry and mental status throughout from the start of the procedure until the patient was taken to the recovery room. The right neck chest and shoulder were sterilely prepped and draped, and a sterile surgical field was created. Ultrasound was used to help visualize a patent right internal jugular vein. This was then accessed under direct ultrasound guidance without difficulty with the Seldinger needle and a permanent image was recorded. A J-wire was placed. After skin nick and dilatation, the peel-away sheath was then placed over the wire. I then anesthetized an area under the clavicle approximately 1-2 fingerbreadths. A transverse incision was created and an inferior pocket was created  with electrocautery and blunt dissection. The port was then brought onto the field, placed into the pocket and secured to the chest wall with 2 Prolene sutures. The catheter was connected to the port and tunneled from the subclavicular incision to the access site. Fluoroscopic guidance was then used to cut the catheter to an appropriate length. The catheter was then placed through the peel-away sheath and the peel-away sheath was removed. The catheter tip was parked in excellent location under fluorocoscopic guidance in the cavoatrial junction. The pocket was then irrigated with antibiotic impregnated saline and the wound was closed with a running 3-0 Vicryl and a 4-0 Monocryl. The access incision was closed with a single 4-0 Monocryl. The Huber needle was used to withdraw blood and flush the port with heparinized saline. Dermabond was then placed as a dressing. The patient tolerated the procedure well and was taken to the recovery room in stable condition.   Leotis Pain 09/16/2017 6:26 PM   This note was created with Dragon Medical transcription system. Any errors in dictation are purely unintentional.

## 2017-09-17 ENCOUNTER — Encounter: Payer: Self-pay | Admitting: Vascular Surgery

## 2017-09-18 ENCOUNTER — Inpatient Hospital Stay: Payer: Medicare Other

## 2017-09-18 ENCOUNTER — Ambulatory Visit
Admission: RE | Admit: 2017-09-18 | Discharge: 2017-09-18 | Disposition: A | Discharge status: Home / Self Care | Payer: Medicare Other | Source: Ambulatory Visit | Attending: Oncology | Admitting: Oncology

## 2017-09-18 DIAGNOSIS — C8208 Follicular lymphoma grade I, lymph nodes of multiple sites: Secondary | ICD-10-CM | POA: Insufficient documentation

## 2017-09-18 MED ORDER — TECHNETIUM TC 99M-LABELED RED BLOOD CELLS IV KIT
23.3700 | PACK | Freq: Once | INTRAVENOUS | Status: AC | PRN
  Administered 2017-09-18: 23.37 via INTRAVENOUS

## 2017-09-18 NOTE — Progress Notes (Signed)
[place]  [phone] [phone]  ID: Veronica Boyd OB: 28-Jan-1946  MR#: [id number]  XYI#:[id number]  Patient Care Team: Glendon Axe, MD as PCP - General (Internal Medicine) Leonie Green, MD as Referring Physician (Surgery)  CHIEF COMPLAINT: Progressive follicular lymphoma  INTERVAL HISTORY: Patient returns to clinic today for further evaluation and consideration of cycle 1 of R-CHOP chemotherapy.  She continues to have right shoulder/scapular pain has become worse.  Her right breast remains swollen and "heavy".  She does not complain of weakness or fatigue. She continues to have a mild chronic cough that is improving and no longer complains of shortness of breath. She has no neurologic complaints. She denies any fevers, night sweats, or weight loss. She has no chest pain. Her appetite is improved and she denies any nausea, vomiting, constipation, or diarrhea. She has no urinary complaints. Patient offers no further specific complaints today.  REVIEW OF SYSTEMS:   Review of Systems  Constitutional: Positive for malaise/fatigue. Negative for fever and weight loss.  Respiratory: Positive for cough. Negative for hemoptysis and shortness of breath.   Cardiovascular: Negative.  Negative for chest pain and leg swelling.  Gastrointestinal: Negative.  Negative for abdominal pain, blood in stool, melena, nausea and vomiting.  Genitourinary: Negative.   Musculoskeletal: Positive for back pain and joint pain.  Skin: Negative.  Negative for rash.  Neurological: Positive for weakness. Negative for sensory change.  Psychiatric/Behavioral: Negative.  The patient is not nervous/anxious and does not have insomnia.    As per HPI. Otherwise, a complete review of systems is negative.   PAST MEDICAL HISTORY: Past Medical History:  Diagnosis Date  . Arthritis   . Cataract    bilateral  . Chicken pox   . Colon polyp   . Follicular lymphoma ([place])  08/2016   lymph nodes   . Hyperlipidemia   . Osteoporosis     PAST SURGICAL HISTORY: Past Surgical History:  Procedure Laterality Date  . AXILLARY LYMPH NODE DISSECTION Right 08/21/2016   Procedure: AXILLARY LYMPH NODE excision;  Surgeon: Leonie Green, MD;  Location: [facility];  Service: General;  Laterality: Right;  . CATARACT EXTRACTION, BILATERAL Bilateral   . JOINT REPLACEMENT Left   . PORTA CATH INSERTION N/A 09/16/2017   Procedure: PORTA CATH INSERTION;  Surgeon: Algernon Huxley, MD;  Location: [place] [facility];  Service: Cardiovascular;  Laterality: N/A;  . TONSILLECTOMY    . TOTAL HIP ARTHROPLASTY Left 1992    FAMILY HISTORY: Family History  Problem Relation Age of Onset  . Diabetes Sister   . Lung cancer Brother   . Diabetes Brother   . Basal cell carcinoma Daughter   . Breast cancer Paternal Aunt     ADVANCED DIRECTIVES (Y/N):  N  HEALTH MAINTENANCE: Social History   Tobacco Use  . Smoking status: Never Smoker  . Smokeless tobacco: Never Used  Substance Use Topics  . Alcohol use: No  . Drug use: No     Colonoscopy:  PAP:  Bone density:  Lipid panel:  No Known Allergies  Current Outpatient Medications  Medication Sig Dispense Refill  . albuterol (PROVENTIL HFA;VENTOLIN HFA) 108 (90 Base) MCG/ACT inhaler Inhale 2 puffs into the lungs every 6 (six) hours as needed for wheezing or shortness of breath. 1 Inhaler 2  . alendronate (FOSAMAX) 70 MG tablet Take 70 mg by mouth once a week. Take with a full glass of water on an empty stomach.    Marland Kitchen  chlorpheniramine-HYDROcodone (TUSSIONEX) 10-8 MG/5ML SUER Take 5 mLs by mouth every 12 (twelve) hours as needed for cough. 240 mL 0  . Cholecalciferol (VITAMIN D-3) 1000 units CAPS Take 1,000 Units by mouth daily.    Marland Kitchen HYDROcodone-acetaminophen (NORCO/VICODIN) 5-325 MG tablet Take 2 tablets every 6 (six) hours as needed by mouth for moderate pain. 60 tablet 0  . morphine (MS CONTIN) 15 MG 12 hr tablet Take 1  tablet (15 mg total) every 12 (twelve) hours by mouth. 60 tablet 0  . Multiple Vitamins-Minerals (CENTRUM SILVER PO) Take 1 tablet by mouth daily.    . ondansetron (ZOFRAN-ODT) 8 MG disintegrating tablet Take 1 tablet (8 mg total) by mouth every 8 (eight) hours as needed for nausea or vomiting. 30 tablet 2  . pantoprazole (PROTONIX) 20 MG tablet Take 1 tablet by mouth daily.    . simvastatin (ZOCOR) 20 MG tablet Take 1 tablet by mouth at bedtime.     Marland Kitchen tiZANidine (ZANAFLEX) 2 MG tablet Take 1 tablet by mouth at bedtime as needed.     . lidocaine-prilocaine (EMLA) cream Apply 1 application as needed topically. (Patient not taking: Reported on 09/19/2017) 30 g 0  . ondansetron (ZOFRAN) 8 MG tablet Take 1 tablet (8 mg total) 2 (two) times daily as needed by mouth for refractory nausea / vomiting. 60 tablet 2  . predniSONE (DELTASONE) 50 MG tablet Take 2 tablets (100 mg total) daily with breakfast for 5 days by mouth. Take on days 1-5 of chemotherapy. 10 tablet 6  . prochlorperazine (COMPAZINE) 10 MG tablet Take 1 tablet (10 mg total) every 6 (six) hours as needed by mouth (Nausea or vomiting). 60 tablet 2   No current facility-administered medications for this visit.    Facility-Administered Medications Ordered in Other Visits  Medication Dose Route Frequency Provider Last Rate Last Dose  . yttrium-90 injection 63.8 millicurie  45.3 millicurie Intravenous Once Chrystal, Eulas Post, MD        OBJECTIVE: Vitals:   09/19/17 1144  BP: 122/82  Pulse: 88  Resp: 20  Temp: (!) 95.5 F (35.3 C)     Body mass index is 22.38 kg/m.    ECOG FS:0 - Asymptomatic  General: Well-developed, well-nourished, no acute distress. Eyes: Pink conjunctiva, anicteric sclera. Breasts: Right breast enlarged with what appears to be lymphedema. Lungs: Clear to auscultation bilaterally. Heart: Regular rate and rhythm. No rubs, murmurs, or gallops. Abdomen: Soft, nontender, nondistended. No organomegaly noted, normoactive  bowel sounds. Musculoskeletal: No edema, cyanosis, or clubbing. Neuro: Alert, answering all questions appropriately. Cranial nerves grossly intact. Skin: No rashes or petechiae noted. Psych: Normal affect. Lymphatics: Easily palpable lymphadenopathy in right axilla. Fullness noted surrounding right clavicle, nearly resolved.    LAB RESULTS:  Lab Results  Component Value Date   NA 135 09/19/2017   K 3.9 09/19/2017   CL 98 (L) 09/19/2017   CO2 28 09/19/2017   GLUCOSE 126 (H) 09/19/2017   BUN 13 09/19/2017   CREATININE 0.74 09/19/2017   CALCIUM 9.6 09/19/2017   PROT 6.6 09/19/2017   ALBUMIN 3.7 09/19/2017   AST 25 09/19/2017   ALT 14 09/19/2017   ALKPHOS 57 09/19/2017   BILITOT 0.5 09/19/2017   GFRNONAA >60 09/19/2017   GFRAA >60 09/19/2017    Lab Results  Component Value Date   WBC 4.7 09/19/2017   NEUTROABS 2.3 09/19/2017   HGB 12.6 09/19/2017   HCT 37.1 09/19/2017   MCV 94.6 09/19/2017   PLT 301 09/19/2017  STUDIES: Ct Soft Tissue Neck W Contrast  Result Date: 09/12/2017 CLINICAL DATA:  Grade 1 follicular lymphoma EXAM: CT NECK WITH CONTRAST TECHNIQUE: Multidetector CT imaging of the neck was performed using the standard protocol following the bolus administration of intravenous contrast. CONTRAST:  174m ISOVUE-300 IOPAMIDOL (ISOVUE-300) INJECTION 61% COMPARISON:  PET 04/19/2017 FINDINGS: Pharynx and larynx: Normal. No mass or swelling. Salivary glands: Negative Thyroid: Negative Lymph nodes: 10 mm right supraclavicular node has developed since the prior PET scan. Left supraclavicular lymph nodes measuring 14 mm and 11 mm compatible with lymphoma. Progression of disease since the prior PET. Superior mediastinal and pleural tumor in the right medial apex has progressed. See chest CT report from today. Upper cervical nodes are not enlarged. Vascular: Negative Limited intracranial: Negative Visualized orbits: Negative Mastoids and visualized paranasal sinuses: Negative  Skeleton: Degenerative change and spurring in the cervical spine. No acute skeletal lesion Upper chest: Chest CT from today reported separately Other: None IMPRESSION: Progression of bilateral supraclavicular adenopathy. Progression of mediastinal adenopathy. Chest CT from today reported separately. Electronically Signed   By: CFranchot GalloM.D.   On: 09/12/2017 15:06   Ct Chest W Contrast  Result Date: 09/12/2017 CLINICAL DATA:  Patient with history of lymphoma. Right shoulder bloody pain. EXAM: CT CHEST, ABDOMEN, AND PELVIS WITH CONTRAST TECHNIQUE: Multidetector CT imaging of the chest, abdomen and pelvis was performed following the standard protocol during bolus administration of intravenous contrast. CONTRAST:  1025mISOVUE-300 IOPAMIDOL (ISOVUE-300) INJECTION 61% COMPARISON:  Chest CT 08/06/2017 ; PET-CT 04/19/2017. CT CAP 06/11/2017. FINDINGS: CT CHEST FINDINGS Cardiovascular: Normal heart size. Aorta and main pulmonary artery are normal in caliber. Calcified thoracic aortic plaque. Mediastinum/Nodes: Overall there has been interval decrease in size of left axillary adenopathy, mediastinal and hilar adenopathy as well as internal mammary chain adenopathy. Reference left axillary lymph node measures 9 mm (image 18; series 4), previously 11 mm. Reference left internal mammary lymph node measures 1.6 cm (image 22; series 4), previously 2.0 cm. Reference right internal mammary chain node measures 3.6 cm (image 35; series 4), previously 4.0 cm. Bulky centrally necrotic right axillary lymph node measures 4.3 x 3.6 cm (image 30; series 4), previously 5.1 x 4.1 cm. There is increased irregular soft tissue throughout the right breast parenchyma measuring 8.9 x 4.0 cm (image 41; series 4), previously 2.9 x 5.3 cm. Additionally there is suggestion of skin thickening overlying the right breast and multiple additional new breast and subcutaneous nodules (image 45; series 4). Reference nodule within the medial right  breast measures 1.5 cm (image 43; series 4). Additionally there is a lateral right breast mass measuring 3.0 cm (image 30; series 4), previously 2.2 cm. Interval decrease in T5-T7 paraspinal adenopathy measuring 1.1 cm (image 23; series 4), previously 1.5 cm. Lungs/Pleura: Central airways are patent. Interval decrease in size of small right pleural effusion. Slight interval decrease in size of left upper lobe nodule measuring 3 mm (image 21; series 7), previously 6 mm. There is a 10 mm subpleural nodule right lower lobe (image 30; series 4). Additional small subpleural nodules throughout the right hemithorax which appear decreased from prior. Musculoskeletal: No aggressive or acute appearing osseous lesions. CT ABDOMEN PELVIS FINDINGS Hepatobiliary: The liver is normal in size and contour. No focal hepatic lesion is identified. Gallbladder is unremarkable. No intrahepatic or extrahepatic biliary ductal dilatation. Pancreas: Unremarkable Spleen: Unremarkable Adrenals/Urinary Tract: Normal adrenal glands. Kidneys enhance symmetrically with contrast. No hydronephrosis. Urinary bladder is unremarkable. Stomach/Bowel: No abnormal bowel wall thickening  or evidence for bowel obstruction. No free fluid or free intraperitoneal air. Normal morphology of the stomach. Vascular/Lymphatic: Normal caliber abdominal aorta. Peripheral calcified atherosclerotic plaque. No retroperitoneal lymphadenopathy. Reproductive: Uterus and adnexal structures are unremarkable. Other: None. Musculoskeletal: Left hip arthroplasty. Lumbar spine degenerative changes. No aggressive or acute appearing osseous lesions. IMPRESSION: 1. When compared to prior chest CT 08/06/2017 there has been interval decrease in mediastinal, supraclavicular, axillary and paraspinal adenopathy. 2. There has been interval increase in size of dominant soft tissue mass within the right breast with overlying skin thickening. Additionally there is suggestion of multiple new/  enlarging nodules within the right breast. While this soft tissue mass may potentially be related to the known lymphoma, the possibility of a breast malignancy is not excluded given the fact the right breast masses have increased in size while the remainder of the soft tissue masses throughout the chest and mediastinum have decreased in size. Electronically Signed   By: Lovey Newcomer M.D.   On: 09/12/2017 19:42   Nm Cardiac Muga Rest  Result Date: 09/18/2017 CLINICAL DATA:  Lymphoma.  Pre chemotherapy cardiac assessment EXAM: NUCLEAR MEDICINE CARDIAC BLOOD POOL IMAGING (MUGA) TECHNIQUE: Cardiac multi-gated acquisition was performed at rest following intravenous injection of Tc-60mlabeled red blood cells. RADIOPHARMACEUTICALS:  23.37 mCi Tc-954mDP in-vitro labeled red blood cells IV COMPARISON:  None FINDINGS: The left ventricular ejection fraction equals 54.6%. This was calculated at a heart rate of 98 BPM. No wall motion abnormality identified. IMPRESSION: 1. Left ventricular ejection fraction equals 54.6%. Electronically Signed   By: TaKerby Moors.D.   On: 09/18/2017 15:32   Ct Abdomen Pelvis W Contrast  Result Date: 09/12/2017 CLINICAL DATA:  Patient with history of lymphoma. Right shoulder bloody pain. EXAM: CT CHEST, ABDOMEN, AND PELVIS WITH CONTRAST TECHNIQUE: Multidetector CT imaging of the chest, abdomen and pelvis was performed following the standard protocol during bolus administration of intravenous contrast. CONTRAST:  10012mSOVUE-300 IOPAMIDOL (ISOVUE-300) INJECTION 61% COMPARISON:  Chest CT 08/06/2017 ; PET-CT 04/19/2017. CT CAP 06/11/2017. FINDINGS: CT CHEST FINDINGS Cardiovascular: Normal heart size. Aorta and main pulmonary artery are normal in caliber. Calcified thoracic aortic plaque. Mediastinum/Nodes: Overall there has been interval decrease in size of left axillary adenopathy, mediastinal and hilar adenopathy as well as internal mammary chain adenopathy. Reference left axillary  lymph node measures 9 mm (image 18; series 4), previously 11 mm. Reference left internal mammary lymph node measures 1.6 cm (image 22; series 4), previously 2.0 cm. Reference right internal mammary chain node measures 3.6 cm (image 35; series 4), previously 4.0 cm. Bulky centrally necrotic right axillary lymph node measures 4.3 x 3.6 cm (image 30; series 4), previously 5.1 x 4.1 cm. There is increased irregular soft tissue throughout the right breast parenchyma measuring 8.9 x 4.0 cm (image 41; series 4), previously 2.9 x 5.3 cm. Additionally there is suggestion of skin thickening overlying the right breast and multiple additional new breast and subcutaneous nodules (image 45; series 4). Reference nodule within the medial right breast measures 1.5 cm (image 43; series 4). Additionally there is a lateral right breast mass measuring 3.0 cm (image 30; series 4), previously 2.2 cm. Interval decrease in T5-T7 paraspinal adenopathy measuring 1.1 cm (image 23; series 4), previously 1.5 cm. Lungs/Pleura: Central airways are patent. Interval decrease in size of small right pleural effusion. Slight interval decrease in size of left upper lobe nodule measuring 3 mm (image 21; series 7), previously 6 mm. There is a 10 mm subpleural nodule  right lower lobe (image 30; series 4). Additional small subpleural nodules throughout the right hemithorax which appear decreased from prior. Musculoskeletal: No aggressive or acute appearing osseous lesions. CT ABDOMEN PELVIS FINDINGS Hepatobiliary: The liver is normal in size and contour. No focal hepatic lesion is identified. Gallbladder is unremarkable. No intrahepatic or extrahepatic biliary ductal dilatation. Pancreas: Unremarkable Spleen: Unremarkable Adrenals/Urinary Tract: Normal adrenal glands. Kidneys enhance symmetrically with contrast. No hydronephrosis. Urinary bladder is unremarkable. Stomach/Bowel: No abnormal bowel wall thickening or evidence for bowel obstruction. No free  fluid or free intraperitoneal air. Normal morphology of the stomach. Vascular/Lymphatic: Normal caliber abdominal aorta. Peripheral calcified atherosclerotic plaque. No retroperitoneal lymphadenopathy. Reproductive: Uterus and adnexal structures are unremarkable. Other: None. Musculoskeletal: Left hip arthroplasty. Lumbar spine degenerative changes. No aggressive or acute appearing osseous lesions. IMPRESSION: 1. When compared to prior chest CT 08/06/2017 there has been interval decrease in mediastinal, supraclavicular, axillary and paraspinal adenopathy. 2. There has been interval increase in size of dominant soft tissue mass within the right breast with overlying skin thickening. Additionally there is suggestion of multiple new/ enlarging nodules within the right breast. While this soft tissue mass may potentially be related to the known lymphoma, the possibility of a breast malignancy is not excluded given the fact the right breast masses have increased in size while the remainder of the soft tissue masses throughout the chest and mediastinum have decreased in size. Electronically Signed   By: Lovey Newcomer M.D.   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  PLAN:    1. Recurrent follicular lymphoma: CT scan results from September 12, 2017 reviewed independently reported as above revealing progressive disease despite receiving Zevalin on July 30, 2017.  Patient wishes to continue with treatment therefore will initiate cycle 1 of R CHOP chemotherapy tomorrow with Neulasta support.  Pretreatment MUGA revealed an EF of 54%.  Return to clinic in 2 weeks for laboratory work and further evaluation and in 3 weeks for consideration of cycle 2.  Reimage after 4 or 6 cycles. 2. Hyperglycemia: Improved. 3. Shoulder pain: CT scan results as above.  Likely secondary to progressive disease.  Treatment as above. 4. Shortness of breath/cough: Improved. 5.  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  Treatment as above.   Approximately 30 minutes was spent in discussion of which greater than 50% was consultation.  Patient expressed understanding and was in agreement with this plan. She also understands that She can call clinic at any time with any questions, concerns, or complaints.   Cancer Staging Follicular lymphoma ([place]) Staging form: Lymphoid Neoplasms, AJCC 6th Edition - Clinical stage from 08/27/2016: Stage IIE (lymphoma only) - Signed by Lloyd Huger, MD on 08/27/2016   Lloyd Huger, MD   09/19/2017 4:39 PM

## 2017-09-19 ENCOUNTER — Other Ambulatory Visit: Payer: Self-pay

## 2017-09-19 ENCOUNTER — Other Ambulatory Visit: Payer: Self-pay | Admitting: Oncology

## 2017-09-19 ENCOUNTER — Inpatient Hospital Stay (HOSPITAL_BASED_OUTPATIENT_CLINIC_OR_DEPARTMENT_OTHER): Payer: Medicare Other | Admitting: Oncology

## 2017-09-19 ENCOUNTER — Encounter: Payer: Self-pay | Admitting: Oncology

## 2017-09-19 ENCOUNTER — Inpatient Hospital Stay: Payer: Medicare Other | Admitting: *Deleted

## 2017-09-19 VITALS — BP 122/82 | HR 88 | Temp 95.5°F | Resp 20 | Wt 110.8 lb

## 2017-09-19 DIAGNOSIS — C8208 Follicular lymphoma grade I, lymph nodes of multiple sites: Secondary | ICD-10-CM

## 2017-09-19 DIAGNOSIS — N6310 Unspecified lump in the right breast, unspecified quadrant: Secondary | ICD-10-CM

## 2017-09-19 DIAGNOSIS — Z803 Family history of malignant neoplasm of breast: Secondary | ICD-10-CM | POA: Diagnosis not present

## 2017-09-19 DIAGNOSIS — Z79899 Other long term (current) drug therapy: Secondary | ICD-10-CM

## 2017-09-19 DIAGNOSIS — Z7689 Persons encountering health services in other specified circumstances: Secondary | ICD-10-CM

## 2017-09-19 DIAGNOSIS — E785 Hyperlipidemia, unspecified: Secondary | ICD-10-CM | POA: Diagnosis not present

## 2017-09-19 DIAGNOSIS — M546 Pain in thoracic spine: Secondary | ICD-10-CM | POA: Diagnosis not present

## 2017-09-19 DIAGNOSIS — Z7189 Other specified counseling: Secondary | ICD-10-CM | POA: Insufficient documentation

## 2017-09-19 DIAGNOSIS — M25511 Pain in right shoulder: Secondary | ICD-10-CM

## 2017-09-19 DIAGNOSIS — M129 Arthropathy, unspecified: Secondary | ICD-10-CM | POA: Diagnosis not present

## 2017-09-19 DIAGNOSIS — J90 Pleural effusion, not elsewhere classified: Secondary | ICD-10-CM | POA: Diagnosis not present

## 2017-09-19 DIAGNOSIS — Z801 Family history of malignant neoplasm of trachea, bronchus and lung: Secondary | ICD-10-CM

## 2017-09-19 DIAGNOSIS — R05 Cough: Secondary | ICD-10-CM

## 2017-09-19 DIAGNOSIS — D72819 Decreased white blood cell count, unspecified: Secondary | ICD-10-CM | POA: Diagnosis not present

## 2017-09-19 DIAGNOSIS — C8290 Follicular lymphoma, unspecified, unspecified site: Secondary | ICD-10-CM

## 2017-09-19 DIAGNOSIS — R739 Hyperglycemia, unspecified: Secondary | ICD-10-CM | POA: Diagnosis not present

## 2017-09-19 DIAGNOSIS — Z5111 Encounter for antineoplastic chemotherapy: Secondary | ICD-10-CM | POA: Diagnosis not present

## 2017-09-19 DIAGNOSIS — M810 Age-related osteoporosis without current pathological fracture: Secondary | ICD-10-CM

## 2017-09-19 LAB — COMPREHENSIVE METABOLIC PANEL
ALT: 14 U/L (ref 14–54)
ANION GAP: 9 (ref 5–15)
AST: 25 U/L (ref 15–41)
Albumin: 3.7 g/dL (ref 3.5–5.0)
Alkaline Phosphatase: 57 U/L (ref 38–126)
BUN: 13 mg/dL (ref 6–20)
CALCIUM: 9.6 mg/dL (ref 8.9–10.3)
CO2: 28 mmol/L (ref 22–32)
Chloride: 98 mmol/L — ABNORMAL LOW (ref 101–111)
Creatinine, Ser: 0.74 mg/dL (ref 0.44–1.00)
GLUCOSE: 126 mg/dL — AB (ref 65–99)
Potassium: 3.9 mmol/L (ref 3.5–5.1)
Sodium: 135 mmol/L (ref 135–145)
Total Bilirubin: 0.5 mg/dL (ref 0.3–1.2)
Total Protein: 6.6 g/dL (ref 6.5–8.1)

## 2017-09-19 LAB — CBC WITH DIFFERENTIAL/PLATELET
BASOS PCT: 1 %
Basophils Absolute: 0 10*3/uL (ref 0–0.1)
EOS ABS: 0 10*3/uL (ref 0–0.7)
EOS PCT: 0 %
HCT: 37.1 % (ref 35.0–47.0)
Hemoglobin: 12.6 g/dL (ref 12.0–16.0)
Lymphocytes Relative: 22 %
Lymphs Abs: 1 10*3/uL (ref 1.0–3.6)
MCH: 32.1 pg (ref 26.0–34.0)
MCHC: 34 g/dL (ref 32.0–36.0)
MCV: 94.6 fL (ref 80.0–100.0)
MONO ABS: 1.3 10*3/uL — AB (ref 0.2–0.9)
Monocytes Relative: 28 %
Neutro Abs: 2.3 10*3/uL (ref 1.4–6.5)
Neutrophils Relative %: 49 %
PLATELETS: 301 10*3/uL (ref 150–440)
RBC: 3.92 MIL/uL (ref 3.80–5.20)
RDW: 19.1 % — AB (ref 11.5–14.5)
WBC: 4.7 10*3/uL (ref 3.6–11.0)

## 2017-09-19 MED ORDER — ONDANSETRON HCL 8 MG PO TABS: 8.0000 mg | ORAL_TABLET | Freq: Two times a day (BID) | ORAL | 2 refills | Status: DC | PRN

## 2017-09-19 MED ORDER — HYDROCODONE-ACETAMINOPHEN 5-325 MG PO TABS: 2.0000 | ORAL_TABLET | Freq: Four times a day (QID) | ORAL | 0 refills | Status: DC | PRN

## 2017-09-19 MED ORDER — PROCHLORPERAZINE MALEATE 10 MG PO TABS: 10.0000 mg | ORAL_TABLET | Freq: Four times a day (QID) | ORAL | 2 refills | Status: DC | PRN

## 2017-09-19 MED ORDER — PREDNISONE 50 MG PO TABS: 100.0000 mg | ORAL_TABLET | Freq: Every day | ORAL | 6 refills | Status: AC

## 2017-09-19 NOTE — Progress Notes (Signed)
START OFF PATHWAY REGIMEN - Lymphoma and CLL   [id number]:R-CHOP q21 days + Pegfilgrastim:   A cycle is every 21 days:     Rituximab      Cyclophosphamide      Doxorubicin      Vincristine      Prednisone      [id number]-[id number]   **Always confirm dose/schedule in your pharmacy ordering system**    Patient Characteristics: Follicular Lymphoma, Grades 1, 2, and 3A, Second Line, Prior Treatment with Bendamustine + Rituximab, Relapse ? 24 Months Disease Type: Follicular Lymphoma Disease Type: Not Applicable Disease Type: Not Applicable [place] Stage: III Tumor Grade: 2 Line of therapy: Second Line Prior Treatment: Prior Treatment with Bendamustine + Rituximab Time to Relapse: Relapse ? 24 Months Intent of Therapy: Curative Intent, Discussed with Patient

## 2017-09-19 NOTE — Progress Notes (Signed)
Patient here today for follow up, reports pain 3/10 today.

## 2017-09-20 ENCOUNTER — Inpatient Hospital Stay: Payer: Medicare Other

## 2017-09-20 VITALS — BP 114/68 | HR 88 | Temp 97.3°F | Resp 18 | Wt 110.8 lb

## 2017-09-20 DIAGNOSIS — Z5111 Encounter for antineoplastic chemotherapy: Secondary | ICD-10-CM | POA: Diagnosis not present

## 2017-09-20 DIAGNOSIS — C8208 Follicular lymphoma grade I, lymph nodes of multiple sites: Secondary | ICD-10-CM

## 2017-09-20 MED ORDER — SODIUM CHLORIDE 0.9 % IV SOLN: 10.0000 mg | Freq: Once | INTRAVENOUS | Status: DC

## 2017-09-20 MED ORDER — PEGFILGRASTIM 6 MG/0.6ML ~~LOC~~ PSKT
6.0000 mg | PREFILLED_SYRINGE | Freq: Once | SUBCUTANEOUS | Status: AC
  Administered 2017-09-20: 6 mg via SUBCUTANEOUS
  Filled 2017-09-20: qty 0.6

## 2017-09-20 MED ORDER — HEPARIN SOD (PORK) LOCK FLUSH 100 UNIT/ML IV SOLN
500.0000 [IU] | Freq: Once | INTRAVENOUS | Status: AC | PRN
  Administered 2017-09-20: 500 [IU]
  Filled 2017-09-20: qty 5

## 2017-09-20 MED ORDER — SODIUM CHLORIDE 0.9% FLUSH
10.0000 mL | INTRAVENOUS | Status: DC | PRN
  Administered 2017-09-20: 10 mL
  Filled 2017-09-20: qty 10

## 2017-09-20 MED ORDER — SODIUM CHLORIDE 0.9 % IV SOLN
1000.0000 mg | Freq: Once | INTRAVENOUS | Status: AC
  Administered 2017-09-20: 1000 mg via INTRAVENOUS
  Filled 2017-09-20: qty 50

## 2017-09-20 MED ORDER — DIPHENHYDRAMINE HCL 25 MG PO CAPS
25.0000 mg | ORAL_CAPSULE | Freq: Once | ORAL | Status: AC
  Administered 2017-09-20: 25 mg via ORAL
  Filled 2017-09-20: qty 1

## 2017-09-20 MED ORDER — DOXORUBICIN HCL CHEMO IV INJECTION 2 MG/ML
50.0000 mg/m2 | Freq: Once | INTRAVENOUS | Status: AC
  Administered 2017-09-20: 72 mg via INTRAVENOUS
  Filled 2017-09-20: qty 36

## 2017-09-20 MED ORDER — VINCRISTINE SULFATE CHEMO INJECTION 1 MG/ML
2.0000 mg | Freq: Once | INTRAVENOUS | Status: AC
  Administered 2017-09-20: 2 mg via INTRAVENOUS
  Filled 2017-09-20: qty 2

## 2017-09-20 MED ORDER — SODIUM CHLORIDE 0.9 % IV SOLN
375.0000 mg/m2 | Freq: Once | INTRAVENOUS | Status: AC
  Administered 2017-09-20: 500 mg via INTRAVENOUS
  Filled 2017-09-20: qty 50

## 2017-09-20 MED ORDER — ACETAMINOPHEN 325 MG PO TABS
650.0000 mg | ORAL_TABLET | Freq: Once | ORAL | Status: AC
  Administered 2017-09-20: 650 mg via ORAL
  Filled 2017-09-20: qty 2

## 2017-09-20 MED ORDER — DEXAMETHASONE SODIUM PHOSPHATE 10 MG/ML IJ SOLN
10.0000 mg | Freq: Once | INTRAMUSCULAR | Status: AC
  Administered 2017-09-20: 10 mg via INTRAVENOUS
  Filled 2017-09-20: qty 1

## 2017-09-20 MED ORDER — PALONOSETRON HCL INJECTION 0.25 MG/5ML
0.2500 mg | Freq: Once | INTRAVENOUS | Status: AC
Start: 2017-09-20 — End: 2017-09-20
  Administered 2017-09-20: 0.25 mg via INTRAVENOUS
  Filled 2017-09-20: qty 5

## 2017-09-20 MED ORDER — SODIUM CHLORIDE 0.9 % IV SOLN
Freq: Once | INTRAVENOUS | Status: AC
  Administered 2017-09-20: 09:00:00 via INTRAVENOUS
  Filled 2017-09-20: qty 1000

## 2017-09-30 ENCOUNTER — Telehealth: Payer: Self-pay | Admitting: *Deleted

## 2017-09-30 MED ORDER — DIPHENOXYLATE-ATROPINE 2.5-0.025 MG PO TABS: 1.0000 | ORAL_TABLET | Freq: Four times a day (QID) | ORAL | 0 refills | Status: DC | PRN

## 2017-09-30 NOTE — Telephone Encounter (Signed)
Wednesday starting having abdominal pain, not eating, diarrhea. Gave imodium which helped, then had nausea and vomiting Thursday and Friday as well as abdominal pain and diarrhea. States eating solid foods, but it is causing her gums to hurt since last night. Temp 99. She is still nauseated.  Discussed with Lorretta Harp, NP and lomotil and Magic Mouth wash ordered and faxed to pharmacy. Robin informed of this and instructed to call back tomorrow if no improvement. She stated she will and thanked me for helping

## 2017-10-01 NOTE — Progress Notes (Signed)
[place]  [phone]) [phone] Fax:(336) [phone]  ID: [name] OB: [birth year]  MR#: [id number]  ACZ#:[id number]  Patient Care Team: Glendon Axe, MD as PCP - General (Internal Medicine) Leonie Green, MD as Referring Physician (Surgery)  CHIEF COMPLAINT: Progressive follicular lymphoma  INTERVAL HISTORY: Patient returns to clinic today for further evaluation and to assess her toleration cycle 1 of R-CHOP chemotherapy.  Her pain has resolved and her right breast is no longer enlarged.  She does not complain of weakness or fatigue. She continues to have a mild chronic cough that is improving and no longer complains of shortness of breath. She has no neurologic complaints. She denies any fevers, night sweats, or weight loss. She has no chest pain. Her appetite is improved and she denies any nausea, vomiting, constipation, or diarrhea. She has no urinary complaints. Patient offers no further specific complaints today.  REVIEW OF SYSTEMS:   Review of Systems  Constitutional: Positive for malaise/fatigue. Negative for fever and weight loss.  Respiratory: Positive for cough. Negative for hemoptysis and shortness of breath.   Cardiovascular: Negative.  Negative for chest pain and leg swelling.  Gastrointestinal: Negative.  Negative for abdominal pain, blood in stool, melena, nausea and vomiting.  Genitourinary: Negative.   Musculoskeletal: Positive for back pain and joint pain.  Skin: Negative.  Negative for rash.  Neurological: Positive for weakness. Negative for sensory change.  Psychiatric/Behavioral: Negative.  The patient is not nervous/anxious and does not have insomnia.    As per HPI. Otherwise, a complete review of systems is negative.   PAST MEDICAL HISTORY: Past Medical History:  Diagnosis Date  . Arthritis   . Cataract    bilateral  . Chicken pox   . Colon polyp   . Follicular lymphoma ([employer]) 08/2016   lymph nodes   . Hyperlipidemia    . Osteoporosis     PAST SURGICAL HISTORY: Past Surgical History:  Procedure Laterality Date  . AXILLARY LYMPH NODE DISSECTION Right 08/21/2016   Procedure: AXILLARY LYMPH NODE excision;  Surgeon: Leonie Green, MD;  Location: [facility];  Service: General;  Laterality: Right;  . CATARACT EXTRACTION, BILATERAL Bilateral   . JOINT REPLACEMENT Left   . PORTA CATH INSERTION N/A 09/16/2017   Procedure: PORTA CATH INSERTION;  Surgeon: Algernon Huxley, MD;  Location: [facility];  Service: Cardiovascular;  Laterality: N/A;  . TONSILLECTOMY    . TOTAL HIP ARTHROPLASTY Left 1992    FAMILY HISTORY: Family History  Problem Relation Age of Onset  . Diabetes Sister   . Lung cancer Brother   . Diabetes Brother   . Basal cell carcinoma Daughter   . Breast cancer Paternal Aunt     ADVANCED DIRECTIVES (Y/N):  N  HEALTH MAINTENANCE: Social History   Tobacco Use  . Smoking status: Never Smoker  . Smokeless tobacco: Never Used  Substance Use Topics  . Alcohol use: No  . Drug use: No     Colonoscopy:  PAP:  Bone density:  Lipid panel:  No Known Allergies  Current Outpatient Medications  Medication Sig Dispense Refill  . albuterol (PROVENTIL HFA;VENTOLIN HFA) 108 (90 Base) MCG/ACT inhaler Inhale 2 puffs into the lungs every 6 (six) hours as needed for wheezing or shortness of breath. 1 Inhaler 2  . alendronate (FOSAMAX) 70 MG tablet Take 70 mg by mouth once a week. Take with a full glass of water on an empty stomach.    . chlorpheniramine-HYDROcodone (TUSSIONEX) 10-8 MG/5ML  SUER Take 5 mLs by mouth every 12 (twelve) hours as needed for cough. 240 mL 0  . Cholecalciferol (VITAMIN D-3) 1000 units CAPS Take 1,000 Units by mouth daily.    . diphenoxylate-atropine (LOMOTIL) 2.5-0.025 MG tablet Take 1 tablet by mouth 4 (four) times daily as needed for diarrhea or loose stools. 45 tablet 0  . HYDROcodone-acetaminophen (NORCO/VICODIN) 5-325 MG tablet Take 2 tablets every 6 (six)  hours as needed by mouth for moderate pain. 60 tablet 0  . lidocaine-prilocaine (EMLA) cream Apply 1 application as needed topically. (Patient not taking: Reported on 09/19/2017) 30 g 0  . morphine (MS CONTIN) 15 MG 12 hr tablet Take 1 tablet (15 mg total) every 12 (twelve) hours by mouth. 60 tablet 0  . Multiple Vitamins-Minerals (CENTRUM SILVER PO) Take 1 tablet by mouth daily.    . ondansetron (ZOFRAN) 8 MG tablet Take 1 tablet (8 mg total) 2 (two) times daily as needed by mouth for refractory nausea / vomiting. 60 tablet 2  . ondansetron (ZOFRAN-ODT) 8 MG disintegrating tablet Take 1 tablet (8 mg total) by mouth every 8 (eight) hours as needed for nausea or vomiting. 30 tablet 2  . pantoprazole (PROTONIX) 20 MG tablet Take 1 tablet by mouth daily.    . prochlorperazine (COMPAZINE) 10 MG tablet Take 1 tablet (10 mg total) every 6 (six) hours as needed by mouth (Nausea or vomiting). 60 tablet 2  . simvastatin (ZOCOR) 20 MG tablet Take 1 tablet by mouth at bedtime.     . tiZANidine (ZANAFLEX) 2 MG tablet Take 1 tablet by mouth at bedtime as needed.      No current facility-administered medications for this visit.    Facility-Administered Medications Ordered in Other Visits  Medication Dose Route Frequency Provider Last Rate Last Dose  . yttrium-90 injection 22.3 millicurie  22.3 millicurie Intravenous Once Chrystal, Glenn, MD        OBJECTIVE: There were no vitals filed for this visit.   There is no height or weight on file to calculate BMI.    ECOG FS:0 - Asymptomatic  General: Well-developed, well-nourished, no acute distress. Eyes: Pink conjunctiva, anicteric sclera. Breasts: Right breast significantly reduced in size.. Lungs: Clear to auscultation bilaterally. Heart: Regular rate and rhythm. No rubs, murmurs, or gallops. Abdomen: Soft, nontender, nondistended. No organomegaly noted, normoactive bowel sounds. Musculoskeletal: No edema, cyanosis, or clubbing. Neuro: Alert, answering  all questions appropriately. Cranial nerves grossly intact. Skin: No rashes or petechiae noted. Psych: Normal affect. Lymphatics: Easily palpable lymphadenopathy in right axilla.  No palpable supraclavicular or cervical adenopathy.  LAB RESULTS:  Lab Results  Component Value Date   NA 135 09/19/2017   K 3.9 09/19/2017   CL 98 (L) 09/19/2017   CO2 28 09/19/2017   GLUCOSE 126 (H) 09/19/2017   BUN 13 09/19/2017   CREATININE 0.74 09/19/2017   CALCIUM 9.6 09/19/2017   PROT 6.6 09/19/2017   ALBUMIN 3.7 09/19/2017   AST 25 09/19/2017   ALT 14 09/19/2017   ALKPHOS 57 09/19/2017   BILITOT 0.5 09/19/2017   GFRNONAA >60 09/19/2017   GFRAA >60 09/19/2017    Lab Results  Component Value Date   WBC 4.7 09/19/2017   NEUTROABS 2.3 09/19/2017   HGB 12.6 09/19/2017   HCT 37.1 09/19/2017   MCV 94.6 09/19/2017   PLT 301 09/19/2017     STUDIES: Ct Soft Tissue Neck W Contrast  Result Date: 09/12/2017 CLINICAL DATA:  Grade 1 follicular lymphoma EXAM: CT NECK WITH CONTRAST   TECHNIQUE: Multidetector CT imaging of the neck was performed using the standard protocol following the bolus administration of intravenous contrast. CONTRAST:  100mL ISOVUE-300 IOPAMIDOL (ISOVUE-300) INJECTION 61% COMPARISON:  PET 04/19/2017 FINDINGS: Pharynx and larynx: Normal. No mass or swelling. Salivary glands: Negative Thyroid: Negative Lymph nodes: 10 mm right supraclavicular node has developed since the prior PET scan. Left supraclavicular lymph nodes measuring 14 mm and 11 mm compatible with lymphoma. Progression of disease since the prior PET. Superior mediastinal and pleural tumor in the right medial apex has progressed. See chest CT report from today. Upper cervical nodes are not enlarged. Vascular: Negative Limited intracranial: Negative Visualized orbits: Negative Mastoids and visualized paranasal sinuses: Negative Skeleton: Degenerative change and spurring in the cervical spine. No acute skeletal lesion Upper  chest: Chest CT from today reported separately Other: None IMPRESSION: Progression of bilateral supraclavicular adenopathy. Progression of mediastinal adenopathy. Chest CT from today reported separately. Electronically Signed   By: Charles  Clark M.D.   On: 09/12/2017 15:06   Ct Chest W Contrast  Result Date: 09/12/2017 CLINICAL DATA:  Patient with history of lymphoma. Right shoulder bloody pain. EXAM: CT CHEST, ABDOMEN, AND PELVIS WITH CONTRAST TECHNIQUE: Multidetector CT imaging of the chest, abdomen and pelvis was performed following the standard protocol during bolus administration of intravenous contrast. CONTRAST:  100mL ISOVUE-300 IOPAMIDOL (ISOVUE-300) INJECTION 61% COMPARISON:  Chest CT 08/06/2017 ; PET-CT 04/19/2017. CT CAP 06/11/2017. FINDINGS: CT CHEST FINDINGS Cardiovascular: Normal heart size. Aorta and main pulmonary artery are normal in caliber. Calcified thoracic aortic plaque. Mediastinum/Nodes: Overall there has been interval decrease in size of left axillary adenopathy, mediastinal and hilar adenopathy as well as internal mammary chain adenopathy. Reference left axillary lymph node measures 9 mm (image 18; series 4), previously 11 mm. Reference left internal mammary lymph node measures 1.6 cm (image 22; series 4), previously 2.0 cm. Reference right internal mammary chain node measures 3.6 cm (image 35; series 4), previously 4.0 cm. Bulky centrally necrotic right axillary lymph node measures 4.3 x 3.6 cm (image 30; series 4), previously 5.1 x 4.1 cm. There is increased irregular soft tissue throughout the right breast parenchyma measuring 8.9 x 4.0 cm (image 41; series 4), previously 2.9 x 5.3 cm. Additionally there is suggestion of skin thickening overlying the right breast and multiple additional new breast and subcutaneous nodules (image 45; series 4). Reference nodule within the medial right breast measures 1.5 cm (image 43; series 4). Additionally there is a lateral right breast mass  measuring 3.0 cm (image 30; series 4), previously 2.2 cm. Interval decrease in T5-T7 paraspinal adenopathy measuring 1.1 cm (image 23; series 4), previously 1.5 cm. Lungs/Pleura: Central airways are patent. Interval decrease in size of small right pleural effusion. Slight interval decrease in size of left upper lobe nodule measuring 3 mm (image 21; series 7), previously 6 mm. There is a 10 mm subpleural nodule right lower lobe (image 30; series 4). Additional small subpleural nodules throughout the right hemithorax which appear decreased from prior. Musculoskeletal: No aggressive or acute appearing osseous lesions. CT ABDOMEN PELVIS FINDINGS Hepatobiliary: The liver is normal in size and contour. No focal hepatic lesion is identified. Gallbladder is unremarkable. No intrahepatic or extrahepatic biliary ductal dilatation. Pancreas: Unremarkable Spleen: Unremarkable Adrenals/Urinary Tract: Normal adrenal glands. Kidneys enhance symmetrically with contrast. No hydronephrosis. Urinary bladder is unremarkable. Stomach/Bowel: No abnormal bowel wall thickening or evidence for bowel obstruction. No free fluid or free intraperitoneal air. Normal morphology of the stomach. Vascular/Lymphatic: Normal caliber abdominal aorta. Peripheral   calcified atherosclerotic plaque. No retroperitoneal lymphadenopathy. Reproductive: Uterus and adnexal structures are unremarkable. Other: None. Musculoskeletal: Left hip arthroplasty. Lumbar spine degenerative changes. No aggressive or acute appearing osseous lesions. IMPRESSION: 1. When compared to prior chest CT 08/06/2017 there has been interval decrease in mediastinal, supraclavicular, axillary and paraspinal adenopathy. 2. There has been interval increase in size of dominant soft tissue mass within the right breast with overlying skin thickening. Additionally there is suggestion of multiple new/ enlarging nodules within the right breast. While this soft tissue mass may potentially be  related to the known lymphoma, the possibility of a breast malignancy is not excluded given the fact the right breast masses have increased in size while the remainder of the soft tissue masses throughout the chest and mediastinum have decreased in size. Electronically Signed   By: Drew  Davis M.D.   On: 09/12/2017 19:42   Nm Cardiac Muga Rest  Result Date: 09/18/2017 CLINICAL DATA:  Lymphoma.  Pre chemotherapy cardiac assessment EXAM: NUCLEAR MEDICINE CARDIAC BLOOD POOL IMAGING (MUGA) TECHNIQUE: Cardiac multi-gated acquisition was performed at rest following intravenous injection of Tc-99m labeled red blood cells. RADIOPHARMACEUTICALS:  23.37 mCi Tc-99m MDP in-vitro labeled red blood cells IV COMPARISON:  None FINDINGS: The left ventricular ejection fraction equals 54.6%. This was calculated at a heart rate of 98 BPM. No wall motion abnormality identified. IMPRESSION: 1. Left ventricular ejection fraction equals 54.6%. Electronically Signed   By: Taylor  Stroud M.D.   On: 09/18/2017 15:32   Ct Abdomen Pelvis W Contrast  Result Date: 09/12/2017 CLINICAL DATA:  Patient with history of lymphoma. Right shoulder bloody pain. EXAM: CT CHEST, ABDOMEN, AND PELVIS WITH CONTRAST TECHNIQUE: Multidetector CT imaging of the chest, abdomen and pelvis was performed following the standard protocol during bolus administration of intravenous contrast. CONTRAST:  100mL ISOVUE-300 IOPAMIDOL (ISOVUE-300) INJECTION 61% COMPARISON:  Chest CT 08/06/2017 ; PET-CT 04/19/2017. CT CAP 06/11/2017. FINDINGS: CT CHEST FINDINGS Cardiovascular: Normal heart size. Aorta and main pulmonary artery are normal in caliber. Calcified thoracic aortic plaque. Mediastinum/Nodes: Overall there has been interval decrease in size of left axillary adenopathy, mediastinal and hilar adenopathy as well as internal mammary chain adenopathy. Reference left axillary lymph node measures 9 mm (image 18; series 4), previously 11 mm. Reference left internal  mammary lymph node measures 1.6 cm (image 22; series 4), previously 2.0 cm. Reference right internal mammary chain node measures 3.6 cm (image 35; series 4), previously 4.0 cm. Bulky centrally necrotic right axillary lymph node measures 4.3 x 3.6 cm (image 30; series 4), previously 5.1 x 4.1 cm. There is increased irregular soft tissue throughout the right breast parenchyma measuring 8.9 x 4.0 cm (image 41; series 4), previously 2.9 x 5.3 cm. Additionally there is suggestion of skin thickening overlying the right breast and multiple additional new breast and subcutaneous nodules (image 45; series 4). Reference nodule within the medial right breast measures 1.5 cm (image 43; series 4). Additionally there is a lateral right breast mass measuring 3.0 cm (image 30; series 4), previously 2.2 cm. Interval decrease in T5-T7 paraspinal adenopathy measuring 1.1 cm (image 23; series 4), previously 1.5 cm. Lungs/Pleura: Central airways are patent. Interval decrease in size of small right pleural effusion. Slight interval decrease in size of left upper lobe nodule measuring 3 mm (image 21; series 7), previously 6 mm. There is a 10 mm subpleural nodule right lower lobe (image 30; series 4). Additional small subpleural nodules throughout the right hemithorax which appear decreased from prior. Musculoskeletal: No aggressive   or acute appearing osseous lesions. CT ABDOMEN PELVIS FINDINGS Hepatobiliary: The liver is normal in size and contour. No focal hepatic lesion is identified. Gallbladder is unremarkable. No intrahepatic or extrahepatic biliary ductal dilatation. Pancreas: Unremarkable Spleen: Unremarkable Adrenals/Urinary Tract: Normal adrenal glands. Kidneys enhance symmetrically with contrast. No hydronephrosis. Urinary bladder is unremarkable. Stomach/Bowel: No abnormal bowel wall thickening or evidence for bowel obstruction. No free fluid or free intraperitoneal air. Normal morphology of the stomach. Vascular/Lymphatic:  Normal caliber abdominal aorta. Peripheral calcified atherosclerotic plaque. No retroperitoneal lymphadenopathy. Reproductive: Uterus and adnexal structures are unremarkable. Other: None. Musculoskeletal: Left hip arthroplasty. Lumbar spine degenerative changes. No aggressive or acute appearing osseous lesions. IMPRESSION: 1. When compared to prior chest CT 08/06/2017 there has been interval decrease in mediastinal, supraclavicular, axillary and paraspinal adenopathy. 2. There has been interval increase in size of dominant soft tissue mass within the right breast with overlying skin thickening. Additionally there is suggestion of multiple new/ enlarging nodules within the right breast. While this soft tissue mass may potentially be related to the known lymphoma, the possibility of a breast malignancy is not excluded given the fact the right breast masses have increased in size while the remainder of the soft tissue masses throughout the chest and mediastinum have decreased in size. Electronically Signed   By: Lovey Newcomer M.D.   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  PLAN:    1. Recurrent follicular lymphoma: CT scan results from September 12, 2017 reviewed independently reported as above revealing progressive disease despite receiving Zevalin on July 30, 2017.  Patient wishes to continue with treatment received cycle 1 of R CHOP chemotherapy with Neulasta support 2 weeks ago.  She tolerated her treatments well.  Pretreatment MUGA on September 18, 2017 revealed an EF of 54%.  Return to clinic number 04/2017 for further evaluation and consideration of cycle 2.  Plan to reimage after 4 cycles. 2. Hyperglycemia: Improved. 3. Shoulder pain: Improved.  CT scan results as above.  Likely secondary to progressive disease.  Treatment as above. 4. Shortness of breath/cough: Improved. 5.  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  Treatment  as above.   Approximately 30 minutes was spent in discussion of which greater than 50% was consultation.  Patient expressed understanding and was in agreement with this plan. She also understands that She can call clinic at any time with any questions, concerns, or complaints.   Cancer Staging Follicular lymphoma ([place]) Staging form: Lymphoid Neoplasms, AJCC 6th Edition - Clinical stage from 08/27/2016: Stage IIE (lymphoma only) - Signed by Lloyd Huger, MD on 08/27/2016   Lloyd Huger, MD   10/01/2017 11:07 PM

## 2017-10-03 ENCOUNTER — Inpatient Hospital Stay: Payer: Medicare Other

## 2017-10-03 ENCOUNTER — Inpatient Hospital Stay (HOSPITAL_BASED_OUTPATIENT_CLINIC_OR_DEPARTMENT_OTHER): Payer: Medicare Other | Admitting: Oncology

## 2017-10-03 VITALS — BP 110/81 | HR 102 | Temp 97.6°F | Resp 18 | Wt 105.1 lb

## 2017-10-03 DIAGNOSIS — M25511 Pain in right shoulder: Secondary | ICD-10-CM

## 2017-10-03 DIAGNOSIS — E785 Hyperlipidemia, unspecified: Secondary | ICD-10-CM

## 2017-10-03 DIAGNOSIS — D72819 Decreased white blood cell count, unspecified: Secondary | ICD-10-CM

## 2017-10-03 DIAGNOSIS — Z803 Family history of malignant neoplasm of breast: Secondary | ICD-10-CM

## 2017-10-03 DIAGNOSIS — C8208 Follicular lymphoma grade I, lymph nodes of multiple sites: Secondary | ICD-10-CM

## 2017-10-03 DIAGNOSIS — J90 Pleural effusion, not elsewhere classified: Secondary | ICD-10-CM

## 2017-10-03 DIAGNOSIS — M546 Pain in thoracic spine: Secondary | ICD-10-CM

## 2017-10-03 DIAGNOSIS — M810 Age-related osteoporosis without current pathological fracture: Secondary | ICD-10-CM

## 2017-10-03 DIAGNOSIS — M129 Arthropathy, unspecified: Secondary | ICD-10-CM

## 2017-10-03 DIAGNOSIS — R05 Cough: Secondary | ICD-10-CM | POA: Diagnosis not present

## 2017-10-03 DIAGNOSIS — R739 Hyperglycemia, unspecified: Secondary | ICD-10-CM

## 2017-10-03 DIAGNOSIS — N6310 Unspecified lump in the right breast, unspecified quadrant: Secondary | ICD-10-CM

## 2017-10-03 DIAGNOSIS — I890 Lymphedema, not elsewhere classified: Secondary | ICD-10-CM

## 2017-10-03 DIAGNOSIS — Z79899 Other long term (current) drug therapy: Secondary | ICD-10-CM

## 2017-10-03 DIAGNOSIS — R0602 Shortness of breath: Secondary | ICD-10-CM

## 2017-10-03 DIAGNOSIS — Z801 Family history of malignant neoplasm of trachea, bronchus and lung: Secondary | ICD-10-CM

## 2017-10-03 DIAGNOSIS — Z5111 Encounter for antineoplastic chemotherapy: Secondary | ICD-10-CM | POA: Diagnosis not present

## 2017-10-03 LAB — CBC WITH DIFFERENTIAL/PLATELET
BASOS PCT: 1 %
Basophils Absolute: 0.1 10*3/uL (ref 0–0.1)
Eosinophils Absolute: 0 10*3/uL (ref 0–0.7)
Eosinophils Relative: 0 %
HEMATOCRIT: 34.3 % — AB (ref 35.0–47.0)
HEMOGLOBIN: 11.6 g/dL — AB (ref 12.0–16.0)
LYMPHS PCT: 5 %
Lymphs Abs: 0.5 10*3/uL — ABNORMAL LOW (ref 1.0–3.6)
MCH: 32.2 pg (ref 26.0–34.0)
MCHC: 33.8 g/dL (ref 32.0–36.0)
MCV: 95.2 fL (ref 80.0–100.0)
MONOS PCT: 20 %
Monocytes Absolute: 2.2 10*3/uL — ABNORMAL HIGH (ref 0.2–0.9)
NEUTROS ABS: 8.1 10*3/uL — AB (ref 1.4–6.5)
NEUTROS PCT: 74 %
Platelets: 288 10*3/uL (ref 150–440)
RBC: 3.6 MIL/uL — ABNORMAL LOW (ref 3.80–5.20)
RDW: 20 % — ABNORMAL HIGH (ref 11.5–14.5)
WBC: 10.9 10*3/uL (ref 3.6–11.0)

## 2017-10-03 LAB — COMPREHENSIVE METABOLIC PANEL
ALBUMIN: 3.8 g/dL (ref 3.5–5.0)
ALT: 13 U/L — ABNORMAL LOW (ref 14–54)
ANION GAP: 11 (ref 5–15)
AST: 24 U/L (ref 15–41)
Alkaline Phosphatase: 70 U/L (ref 38–126)
BUN: 9 mg/dL (ref 6–20)
CHLORIDE: 92 mmol/L — AB (ref 101–111)
CO2: 28 mmol/L (ref 22–32)
Calcium: 9.1 mg/dL (ref 8.9–10.3)
Creatinine, Ser: 0.91 mg/dL (ref 0.44–1.00)
GFR calc non Af Amer: >60 mL/min (ref >60)
GLUCOSE: 96 mg/dL (ref 65–99)
POTASSIUM: 3.7 mmol/L (ref 3.5–5.1)
SODIUM: 131 mmol/L — AB (ref 135–145)
Total Bilirubin: 0.7 mg/dL (ref 0.3–1.2)
Total Protein: 6.8 g/dL (ref 6.5–8.1)

## 2017-10-09 NOTE — Progress Notes (Signed)
[place]  [phone] [phone]  ID: Veronica Boyd OB: 07-01-1946  MR#: [id number]  KYH#:[id number]  Patient Care Team: Glendon Axe, MD as PCP - General (Internal Medicine) Leonie Green, MD as Referring Physician (Surgery)  CHIEF COMPLAINT: Progressive follicular lymphoma  INTERVAL HISTORY: Patient returns to clinic today for further evaluation and consideration of cycle 2 of R-CHOP chemotherapy.  Her pain has resolved and her right breast is no longer enlarged.  She does not complain of weakness or fatigue. She continues to have a mild chronic cough that is improving and no longer complains of shortness of breath. She has no neurologic complaints. She denies any fevers, night sweats, or weight loss. She has no chest pain. Her appetite is improved and she denies any nausea, vomiting, constipation, or diarrhea. She has no urinary complaints. Patient offers no further specific complaints today.  REVIEW OF SYSTEMS:   Review of Systems  Constitutional: Positive for malaise/fatigue. Negative for fever and weight loss.  Respiratory: Positive for cough. Negative for hemoptysis and shortness of breath.   Cardiovascular: Negative.  Negative for chest pain and leg swelling.  Gastrointestinal: Negative.  Negative for abdominal pain, blood in stool, melena, nausea and vomiting.  Genitourinary: Negative.   Musculoskeletal: Negative.  Negative for back pain and joint pain.  Skin: Negative.  Negative for rash.  Neurological: Positive for weakness. Negative for sensory change.  Psychiatric/Behavioral: Negative.  The patient is not nervous/anxious and does not have insomnia.    As per HPI. Otherwise, a complete review of systems is negative.   PAST MEDICAL HISTORY: Past Medical History:  Diagnosis Date  . Arthritis   . Cataract    bilateral  . Chicken pox   . Colon polyp   . Follicular lymphoma ([place]) 08/2016   lymph nodes   .  Hyperlipidemia   . Osteoporosis     PAST SURGICAL HISTORY: Past Surgical History:  Procedure Laterality Date  . AXILLARY LYMPH NODE DISSECTION Right 08/21/2016   Procedure: AXILLARY LYMPH NODE excision;  Surgeon: Leonie Green, MD;  Location: [facility] ORS;  Service: General;  Laterality: Right;  . CATARACT EXTRACTION, BILATERAL Bilateral   . JOINT REPLACEMENT Left   . PORTA CATH INSERTION N/A 09/16/2017   Procedure: PORTA CATH INSERTION;  Surgeon: Algernon Huxley, MD;  Location: [facility];  Service: Cardiovascular;  Laterality: N/A;  . TONSILLECTOMY    . TOTAL HIP ARTHROPLASTY Left 1992    FAMILY HISTORY: Family History  Problem Relation Age of Onset  . Diabetes Sister   . Lung cancer Brother   . Diabetes Brother   . Basal cell carcinoma Daughter   . Breast cancer Paternal Aunt     ADVANCED DIRECTIVES (Y/N):  N  HEALTH MAINTENANCE: Social History   Tobacco Use  . Smoking status: Never Smoker  . Smokeless tobacco: Never Used  Substance Use Topics  . Alcohol use: No  . Drug use: No     Colonoscopy:  PAP:  Bone density:  Lipid panel:  No Known Allergies  Current Outpatient Medications  Medication Sig Dispense Refill  . albuterol (PROVENTIL HFA;VENTOLIN HFA) 108 (90 Base) MCG/ACT inhaler Inhale 2 puffs into the lungs every 6 (six) hours as needed for wheezing or shortness of breath. 1 Inhaler 2  . alendronate (FOSAMAX) 70 MG tablet Take 70 mg by mouth once a week. Take with a full glass of water on an empty stomach.    . chlorpheniramine-HYDROcodone (TUSSIONEX) 10-8 MG/5ML  SUER Take 5 mLs by mouth every 12 (twelve) hours as needed for cough. 240 mL 0  . Cholecalciferol (VITAMIN D-3) 1000 units CAPS Take 1,000 Units by mouth daily.    . diphenoxylate-atropine (LOMOTIL) 2.5-0.025 MG tablet Take 1 tablet by mouth 4 (four) times daily as needed for diarrhea or loose stools. 45 tablet 0  . lidocaine-prilocaine (EMLA) cream Apply 1 application as needed  topically. 30 g 0  . Multiple Vitamins-Minerals (CENTRUM SILVER PO) Take 1 tablet by mouth daily.    . ondansetron (ZOFRAN) 8 MG tablet Take 1 tablet (8 mg total) 2 (two) times daily as needed by mouth for refractory nausea / vomiting. 60 tablet 2  . ondansetron (ZOFRAN-ODT) 8 MG disintegrating tablet Take 1 tablet (8 mg total) by mouth every 8 (eight) hours as needed for nausea or vomiting. 30 tablet 2  . pantoprazole (PROTONIX) 20 MG tablet Take 1 tablet by mouth daily.    . prochlorperazine (COMPAZINE) 10 MG tablet Take 1 tablet (10 mg total) every 6 (six) hours as needed by mouth (Nausea or vomiting). 60 tablet 2  . simvastatin (ZOCOR) 20 MG tablet Take 1 tablet by mouth at bedtime.     Marland Kitchen tiZANidine (ZANAFLEX) 2 MG tablet Take 1 tablet by mouth at bedtime as needed.     Marland Kitchen HYDROcodone-acetaminophen (NORCO/VICODIN) 5-325 MG tablet Take 2 tablets by mouth every 6 (six) hours as needed for moderate pain. 60 tablet 0  . megestrol (MEGACE) 40 MG tablet Take 1 tablet (40 mg total) by mouth daily. 30 tablet 2  . morphine (MS CONTIN) 15 MG 12 hr tablet Take 1 tablet (15 mg total) by mouth every 12 (twelve) hours. 60 tablet 0   No current facility-administered medications for this visit.    Facility-Administered Medications Ordered in Other Visits  Medication Dose Route Frequency Provider Last Rate Last Dose  . yttrium-90 injection 24.2 millicurie  68.3 millicurie Intravenous Once Chrystal, Eulas Post, MD        OBJECTIVE: Vitals:   10/10/17 0927  BP: 122/85  Pulse: (!) 114  Resp: 18  Temp: (!) 97.5 F (36.4 C)     Body mass index is 20.82 kg/m.    ECOG FS:0 - Asymptomatic  General: Well-developed, well-nourished, no acute distress. Eyes: Pink conjunctiva, anicteric sclera. Breasts: Right breast significantly reduced in size.. Lungs: Clear to auscultation bilaterally. Heart: Regular rate and rhythm. No rubs, murmurs, or gallops. Abdomen: Soft, nontender, nondistended. No organomegaly noted,  normoactive bowel sounds. Musculoskeletal: No edema, cyanosis, or clubbing. Neuro: Alert, answering all questions appropriately. Cranial nerves grossly intact. Skin: No rashes or petechiae noted. Psych: Normal affect. Lymphatics: Easily palpable lymphadenopathy in right axilla.  No palpable supraclavicular or cervical adenopathy.  LAB RESULTS:  Lab Results  Component Value Date   NA 135 10/10/2017   K 3.5 10/10/2017   CL 99 (L) 10/10/2017   CO2 27 10/10/2017   GLUCOSE 110 (H) 10/10/2017   BUN 8 10/10/2017   CREATININE 0.71 10/10/2017   CALCIUM 9.2 10/10/2017   PROT 6.6 10/10/2017   ALBUMIN 3.7 10/10/2017   AST 33 10/10/2017   ALT 18 10/10/2017   ALKPHOS 54 10/10/2017   BILITOT 0.5 10/10/2017   GFRNONAA >60 10/10/2017   GFRAA >60 10/10/2017    Lab Results  Component Value Date   WBC 4.8 10/10/2017   NEUTROABS 2.9 10/10/2017   HGB 11.8 (L) 10/10/2017   HCT 34.7 (L) 10/10/2017   MCV 96.2 10/10/2017   PLT 333 10/10/2017  STUDIES: Ct Soft Tissue Neck W Contrast  Result Date: 09/12/2017 CLINICAL DATA:  Grade 1 follicular lymphoma EXAM: CT NECK WITH CONTRAST TECHNIQUE: Multidetector CT imaging of the neck was performed using the standard protocol following the bolus administration of intravenous contrast. CONTRAST:  171m ISOVUE-300 IOPAMIDOL (ISOVUE-300) INJECTION 61% COMPARISON:  PET 04/19/2017 FINDINGS: Pharynx and larynx: Normal. No mass or swelling. Salivary glands: Negative Thyroid: Negative Lymph nodes: 10 mm right supraclavicular node has developed since the prior PET scan. Left supraclavicular lymph nodes measuring 14 mm and 11 mm compatible with lymphoma. Progression of disease since the prior PET. Superior mediastinal and pleural tumor in the right medial apex has progressed. See chest CT report from today. Upper cervical nodes are not enlarged. Vascular: Negative Limited intracranial: Negative Visualized orbits: Negative Mastoids and visualized paranasal sinuses:  Negative Skeleton: Degenerative change and spurring in the cervical spine. No acute skeletal lesion Upper chest: Chest CT from today reported separately Other: None IMPRESSION: Progression of bilateral supraclavicular adenopathy. Progression of mediastinal adenopathy. Chest CT from today reported separately. Electronically Signed   By: CFranchot GalloM.D.   On: 09/12/2017 15:06   Ct Chest W Contrast  Result Date: 09/12/2017 CLINICAL DATA:  Patient with history of lymphoma. Right shoulder bloody pain. EXAM: CT CHEST, ABDOMEN, AND PELVIS WITH CONTRAST TECHNIQUE: Multidetector CT imaging of the chest, abdomen and pelvis was performed following the standard protocol during bolus administration of intravenous contrast. CONTRAST:  1056mISOVUE-300 IOPAMIDOL (ISOVUE-300) INJECTION 61% COMPARISON:  Chest CT 08/06/2017 ; PET-CT 04/19/2017. CT CAP 06/11/2017. FINDINGS: CT CHEST FINDINGS Cardiovascular: Normal heart size. Aorta and main pulmonary artery are normal in caliber. Calcified thoracic aortic plaque. Mediastinum/Nodes: Overall there has been interval decrease in size of left axillary adenopathy, mediastinal and hilar adenopathy as well as internal mammary chain adenopathy. Reference left axillary lymph node measures 9 mm (image 18; series 4), previously 11 mm. Reference left internal mammary lymph node measures 1.6 cm (image 22; series 4), previously 2.0 cm. Reference right internal mammary chain node measures 3.6 cm (image 35; series 4), previously 4.0 cm. Bulky centrally necrotic right axillary lymph node measures 4.3 x 3.6 cm (image 30; series 4), previously 5.1 x 4.1 cm. There is increased irregular soft tissue throughout the right breast parenchyma measuring 8.9 x 4.0 cm (image 41; series 4), previously 2.9 x 5.3 cm. Additionally there is suggestion of skin thickening overlying the right breast and multiple additional new breast and subcutaneous nodules (image 45; series 4). Reference nodule within the medial  right breast measures 1.5 cm (image 43; series 4). Additionally there is a lateral right breast mass measuring 3.0 cm (image 30; series 4), previously 2.2 cm. Interval decrease in T5-T7 paraspinal adenopathy measuring 1.1 cm (image 23; series 4), previously 1.5 cm. Lungs/Pleura: Central airways are patent. Interval decrease in size of small right pleural effusion. Slight interval decrease in size of left upper lobe nodule measuring 3 mm (image 21; series 7), previously 6 mm. There is a 10 mm subpleural nodule right lower lobe (image 30; series 4). Additional small subpleural nodules throughout the right hemithorax which appear decreased from prior. Musculoskeletal: No aggressive or acute appearing osseous lesions. CT ABDOMEN PELVIS FINDINGS Hepatobiliary: The liver is normal in size and contour. No focal hepatic lesion is identified. Gallbladder is unremarkable. No intrahepatic or extrahepatic biliary ductal dilatation. Pancreas: Unremarkable Spleen: Unremarkable Adrenals/Urinary Tract: Normal adrenal glands. Kidneys enhance symmetrically with contrast. No hydronephrosis. Urinary bladder is unremarkable. Stomach/Bowel: No abnormal bowel wall thickening  or evidence for bowel obstruction. No free fluid or free intraperitoneal air. Normal morphology of the stomach. Vascular/Lymphatic: Normal caliber abdominal aorta. Peripheral calcified atherosclerotic plaque. No retroperitoneal lymphadenopathy. Reproductive: Uterus and adnexal structures are unremarkable. Other: None. Musculoskeletal: Left hip arthroplasty. Lumbar spine degenerative changes. No aggressive or acute appearing osseous lesions. IMPRESSION: 1. When compared to prior chest CT 08/06/2017 there has been interval decrease in mediastinal, supraclavicular, axillary and paraspinal adenopathy. 2. There has been interval increase in size of dominant soft tissue mass within the right breast with overlying skin thickening. Additionally there is suggestion of multiple  new/ enlarging nodules within the right breast. While this soft tissue mass may potentially be related to the known lymphoma, the possibility of a breast malignancy is not excluded given the fact the right breast masses have increased in size while the remainder of the soft tissue masses throughout the chest and mediastinum have decreased in size. Electronically Signed   By: Lovey Newcomer M.D.   On: 09/12/2017 19:42   Nm Cardiac Muga Rest  Result Date: 09/18/2017 CLINICAL DATA:  Lymphoma.  Pre chemotherapy cardiac assessment EXAM: NUCLEAR MEDICINE CARDIAC BLOOD POOL IMAGING (MUGA) TECHNIQUE: Cardiac multi-gated acquisition was performed at rest following intravenous injection of Tc-4mlabeled red blood cells. RADIOPHARMACEUTICALS:  23.37 mCi Tc-935mDP in-vitro labeled red blood cells IV COMPARISON:  None FINDINGS: The left ventricular ejection fraction equals 54.6%. This was calculated at a heart rate of 98 BPM. No wall motion abnormality identified. IMPRESSION: 1. Left ventricular ejection fraction equals 54.6%. Electronically Signed   By: TaKerby Moors.D.   On: 09/18/2017 15:32   Ct Abdomen Pelvis W Contrast  Result Date: 09/12/2017 CLINICAL DATA:  Patient with history of lymphoma. Right shoulder bloody pain. EXAM: CT CHEST, ABDOMEN, AND PELVIS WITH CONTRAST TECHNIQUE: Multidetector CT imaging of the chest, abdomen and pelvis was performed following the standard protocol during bolus administration of intravenous contrast. CONTRAST:  10057mSOVUE-300 IOPAMIDOL (ISOVUE-300) INJECTION 61% COMPARISON:  Chest CT 08/06/2017 ; PET-CT 04/19/2017. CT CAP 06/11/2017. FINDINGS: CT CHEST FINDINGS Cardiovascular: Normal heart size. Aorta and main pulmonary artery are normal in caliber. Calcified thoracic aortic plaque. Mediastinum/Nodes: Overall there has been interval decrease in size of left axillary adenopathy, mediastinal and hilar adenopathy as well as internal mammary chain adenopathy. Reference left  axillary lymph node measures 9 mm (image 18; series 4), previously 11 mm. Reference left internal mammary lymph node measures 1.6 cm (image 22; series 4), previously 2.0 cm. Reference right internal mammary chain node measures 3.6 cm (image 35; series 4), previously 4.0 cm. Bulky centrally necrotic right axillary lymph node measures 4.3 x 3.6 cm (image 30; series 4), previously 5.1 x 4.1 cm. There is increased irregular soft tissue throughout the right breast parenchyma measuring 8.9 x 4.0 cm (image 41; series 4), previously 2.9 x 5.3 cm. Additionally there is suggestion of skin thickening overlying the right breast and multiple additional new breast and subcutaneous nodules (image 45; series 4). Reference nodule within the medial right breast measures 1.5 cm (image 43; series 4). Additionally there is a lateral right breast mass measuring 3.0 cm (image 30; series 4), previously 2.2 cm. Interval decrease in T5-T7 paraspinal adenopathy measuring 1.1 cm (image 23; series 4), previously 1.5 cm. Lungs/Pleura: Central airways are patent. Interval decrease in size of small right pleural effusion. Slight interval decrease in size of left upper lobe nodule measuring 3 mm (image 21; series 7), previously 6 mm. There is a 10 mm subpleural nodule  right lower lobe (image 30; series 4). Additional small subpleural nodules throughout the right hemithorax which appear decreased from prior. Musculoskeletal: No aggressive or acute appearing osseous lesions. CT ABDOMEN PELVIS FINDINGS Hepatobiliary: The liver is normal in size and contour. No focal hepatic lesion is identified. Gallbladder is unremarkable. No intrahepatic or extrahepatic biliary ductal dilatation. Pancreas: Unremarkable Spleen: Unremarkable Adrenals/Urinary Tract: Normal adrenal glands. Kidneys enhance symmetrically with contrast. No hydronephrosis. Urinary bladder is unremarkable. Stomach/Bowel: No abnormal bowel wall thickening or evidence for bowel obstruction. No  free fluid or free intraperitoneal air. Normal morphology of the stomach. Vascular/Lymphatic: Normal caliber abdominal aorta. Peripheral calcified atherosclerotic plaque. No retroperitoneal lymphadenopathy. Reproductive: Uterus and adnexal structures are unremarkable. Other: None. Musculoskeletal: Left hip arthroplasty. Lumbar spine degenerative changes. No aggressive or acute appearing osseous lesions. IMPRESSION: 1. When compared to prior chest CT 08/06/2017 there has been interval decrease in mediastinal, supraclavicular, axillary and paraspinal adenopathy. 2. There has been interval increase in size of dominant soft tissue mass within the right breast with overlying skin thickening. Additionally there is suggestion of multiple new/ enlarging nodules within the right breast. While this soft tissue mass may potentially be related to the known lymphoma, the possibility of a breast malignancy is not excluded given the fact the right breast masses have increased in size while the remainder of the soft tissue masses throughout the chest and mediastinum have decreased in size. Electronically Signed   By: Lovey Newcomer M.D.   On: 09/12/2017 19:42   Dg Fluoro Guide Cv Line Right  Result Date: 10/10/2017 CLINICAL DATA:  check patency of port due to lack of blood return, patient receiving RCHOP EXAM: PORT  CATHETER INJECTION UNDER FLUOROSCOPY TECHNIQUE: The procedure, risks (including but not limited to bleeding, infection, organ damage ), benefits, and alternatives were explained to the patient. Questions regarding the procedure were encouraged and answered. The patient understands and consents to the procedure. Survey fluoroscopic inspection reveals normal position of the right IJ port catheter, tip distal SVC. Tubing intact. Injection demonstrates partially encasing fibrin sheath around the tip of the catheter. Contrast flows easily into the SVC which appears widely patent. No extravasation. IMPRESSION: 1. Partially  encasing fibrin sheath around the tip of the catheter in the distal SVC. Catheter is okay for effusion. PLAN: 1. Catheter can be used for infusion now. 2. Patient is scheduled for CathFlow dwell protocol treatment tomorrow 8am at the cancer center. 1. Electronically Signed   By: Lucrezia Europe M.D.   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  PLAN:    1. Recurrent follicular lymphoma: CT scan results from September 12, 2017 reviewed independently reported as above revealing progressive disease despite receiving Zevalin on July 30, 2017. Pretreatment MUGA on September 18, 2017 revealed an EF of 54%.  Patient required a dye study today to assess her port, therefore she will return to clinic tomorrow for cycle 2 of R CHOP chemotherapy with Neulasta support.   Return to clinic in 3 weeks for further evaluation and consideration of cycle 3.  Plan to reimage after 4 cycles. 2. Hyperglycemia: Improved. 3. Shoulder pain: Improved.  CT scan results as above.  Likely secondary to progressive disease.  Treatment as above. 4. Shortness of breath/cough: Improved. 5.  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  Treatment as above.   Approximately 30 minutes was spent in discussion of which greater than 50% was consultation.  Patient expressed understanding and was in  agreement with this plan. She also understands that She can call clinic at any time with any questions, concerns, or complaints.   Cancer Staging Follicular lymphoma ([place]) Staging form: Lymphoid Neoplasms, AJCC 6th Edition - Clinical stage from 08/27/2016: Stage IIE (lymphoma only) - Signed by Lloyd Huger, MD on 08/27/2016   Lloyd Huger, MD   10/12/2017 9:30 AM

## 2017-10-10 ENCOUNTER — Inpatient Hospital Stay (HOSPITAL_BASED_OUTPATIENT_CLINIC_OR_DEPARTMENT_OTHER): Payer: Medicare Other | Admitting: Oncology

## 2017-10-10 ENCOUNTER — Other Ambulatory Visit: Payer: Self-pay | Admitting: *Deleted

## 2017-10-10 ENCOUNTER — Other Ambulatory Visit: Payer: Self-pay

## 2017-10-10 ENCOUNTER — Encounter: Payer: Self-pay | Admitting: Oncology

## 2017-10-10 ENCOUNTER — Inpatient Hospital Stay: Payer: Medicare Other | Attending: Oncology

## 2017-10-10 ENCOUNTER — Inpatient Hospital Stay: Payer: Medicare Other

## 2017-10-10 ENCOUNTER — Ambulatory Visit
Admission: RE | Admit: 2017-10-10 | Discharge: 2017-10-10 | Disposition: A | Discharge status: Home / Self Care | Payer: Medicare Other | Source: Ambulatory Visit | Attending: Oncology | Admitting: Oncology

## 2017-10-10 VITALS — BP 122/85 | HR 114 | Temp 97.5°F | Resp 18 | Wt 103.1 lb

## 2017-10-10 DIAGNOSIS — R5383 Other fatigue: Secondary | ICD-10-CM

## 2017-10-10 DIAGNOSIS — Z808 Family history of malignant neoplasm of other organs or systems: Secondary | ICD-10-CM | POA: Insufficient documentation

## 2017-10-10 DIAGNOSIS — R05 Cough: Secondary | ICD-10-CM

## 2017-10-10 DIAGNOSIS — C8208 Follicular lymphoma grade I, lymph nodes of multiple sites: Secondary | ICD-10-CM

## 2017-10-10 DIAGNOSIS — M25511 Pain in right shoulder: Secondary | ICD-10-CM | POA: Diagnosis not present

## 2017-10-10 DIAGNOSIS — N6310 Unspecified lump in the right breast, unspecified quadrant: Secondary | ICD-10-CM

## 2017-10-10 DIAGNOSIS — R531 Weakness: Secondary | ICD-10-CM

## 2017-10-10 DIAGNOSIS — Z7689 Persons encountering health services in other specified circumstances: Secondary | ICD-10-CM

## 2017-10-10 DIAGNOSIS — Z803 Family history of malignant neoplasm of breast: Secondary | ICD-10-CM | POA: Insufficient documentation

## 2017-10-10 DIAGNOSIS — R739 Hyperglycemia, unspecified: Secondary | ICD-10-CM

## 2017-10-10 DIAGNOSIS — Z95828 Presence of other vascular implants and grafts: Secondary | ICD-10-CM | POA: Diagnosis not present

## 2017-10-10 DIAGNOSIS — E785 Hyperlipidemia, unspecified: Secondary | ICD-10-CM | POA: Diagnosis not present

## 2017-10-10 DIAGNOSIS — Z801 Family history of malignant neoplasm of trachea, bronchus and lung: Secondary | ICD-10-CM

## 2017-10-10 DIAGNOSIS — Z5112 Encounter for antineoplastic immunotherapy: Secondary | ICD-10-CM | POA: Diagnosis not present

## 2017-10-10 DIAGNOSIS — Z79899 Other long term (current) drug therapy: Secondary | ICD-10-CM | POA: Insufficient documentation

## 2017-10-10 DIAGNOSIS — Z5111 Encounter for antineoplastic chemotherapy: Secondary | ICD-10-CM | POA: Diagnosis not present

## 2017-10-10 LAB — COMPREHENSIVE METABOLIC PANEL
ALK PHOS: 54 U/L (ref 38–126)
ALT: 18 U/L (ref 14–54)
ANION GAP: 9 (ref 5–15)
AST: 33 U/L (ref 15–41)
Albumin: 3.7 g/dL (ref 3.5–5.0)
BUN: 8 mg/dL (ref 6–20)
CALCIUM: 9.2 mg/dL (ref 8.9–10.3)
CO2: 27 mmol/L (ref 22–32)
Chloride: 99 mmol/L — ABNORMAL LOW (ref 101–111)
Creatinine, Ser: 0.71 mg/dL (ref 0.44–1.00)
GFR calc non Af Amer: >60 mL/min (ref >60)
Glucose, Bld: 110 mg/dL — ABNORMAL HIGH (ref 65–99)
POTASSIUM: 3.5 mmol/L (ref 3.5–5.1)
SODIUM: 135 mmol/L (ref 135–145)
TOTAL PROTEIN: 6.6 g/dL (ref 6.5–8.1)
Total Bilirubin: 0.5 mg/dL (ref 0.3–1.2)

## 2017-10-10 LAB — CBC WITH DIFFERENTIAL/PLATELET
BASOS ABS: 0.1 10*3/uL (ref 0–0.1)
BASOS PCT: 1 %
Eosinophils Absolute: 0 10*3/uL (ref 0–0.7)
Eosinophils Relative: 0 %
HEMATOCRIT: 34.7 % — AB (ref 35.0–47.0)
HEMOGLOBIN: 11.8 g/dL — AB (ref 12.0–16.0)
LYMPHS PCT: 14 %
Lymphs Abs: 0.7 10*3/uL — ABNORMAL LOW (ref 1.0–3.6)
MCH: 32.8 pg (ref 26.0–34.0)
MCHC: 34.1 g/dL (ref 32.0–36.0)
MCV: 96.2 fL (ref 80.0–100.0)
MONOS PCT: 24 %
Monocytes Absolute: 1.1 10*3/uL — ABNORMAL HIGH (ref 0.2–0.9)
NEUTROS ABS: 2.9 10*3/uL (ref 1.4–6.5)
NEUTROS PCT: 61 %
Platelets: 333 10*3/uL (ref 150–440)
RBC: 3.6 MIL/uL — ABNORMAL LOW (ref 3.80–5.20)
RDW: 19.9 % — ABNORMAL HIGH (ref 11.5–14.5)
WBC: 4.8 10*3/uL (ref 3.6–11.0)

## 2017-10-10 MED ORDER — MEGESTROL ACETATE 40 MG PO TABS: 40.0000 mg | ORAL_TABLET | Freq: Every day | ORAL | 2 refills | Status: DC

## 2017-10-10 MED ORDER — IOPAMIDOL (ISOVUE-300) INJECTION 61%
5.0000 mL | Freq: Once | INTRAVENOUS | Status: AC | PRN
  Administered 2017-10-10: 5 mL

## 2017-10-10 MED ORDER — HEPARIN SOD (PORK) LOCK FLUSH 100 UNIT/ML IV SOLN
INTRAVENOUS | Status: AC
  Filled 2017-10-10: qty 5

## 2017-10-10 MED ORDER — ALTEPLASE 2 MG IJ SOLR: 2.0000 mg | Freq: Once | INTRAMUSCULAR | Status: DC

## 2017-10-10 NOTE — Progress Notes (Signed)
Patient denies any concerns today.  

## 2017-10-10 NOTE — Progress Notes (Signed)
Patient left [facility] for dye study at 2pm today.  Chemotherapy will be rescheduled for tomorrow.

## 2017-10-11 ENCOUNTER — Other Ambulatory Visit: Payer: Self-pay | Admitting: *Deleted

## 2017-10-11 ENCOUNTER — Inpatient Hospital Stay: Payer: Medicare Other

## 2017-10-11 VITALS — BP 100/65 | HR 91 | Temp 98.1°F | Resp 18 | Wt 104.0 lb

## 2017-10-11 DIAGNOSIS — C8208 Follicular lymphoma grade I, lymph nodes of multiple sites: Secondary | ICD-10-CM

## 2017-10-11 MED ORDER — VINCRISTINE SULFATE CHEMO INJECTION 1 MG/ML
2.0000 mg | Freq: Once | INTRAVENOUS | Status: AC
  Administered 2017-10-11: 2 mg via INTRAVENOUS
  Filled 2017-10-11: qty 2

## 2017-10-11 MED ORDER — PALONOSETRON HCL INJECTION 0.25 MG/5ML
0.2500 mg | Freq: Once | INTRAVENOUS | Status: AC
  Administered 2017-10-11: 0.25 mg via INTRAVENOUS
  Filled 2017-10-11: qty 5

## 2017-10-11 MED ORDER — SODIUM CHLORIDE 0.9 % IV SOLN
Freq: Once | INTRAVENOUS | Status: AC
Start: 2017-10-11 — End: 2017-10-11
  Administered 2017-10-11: 10:00:00 via INTRAVENOUS
  Filled 2017-10-11: qty 1000

## 2017-10-11 MED ORDER — ACETAMINOPHEN 325 MG PO TABS
650.0000 mg | ORAL_TABLET | Freq: Once | ORAL | Status: AC
Start: 2017-10-11 — End: 2017-10-11
  Administered 2017-10-11: 650 mg via ORAL
  Filled 2017-10-11: qty 2

## 2017-10-11 MED ORDER — SODIUM CHLORIDE 0.9 % IV SOLN
375.0000 mg/m2 | Freq: Once | INTRAVENOUS | Status: AC
  Administered 2017-10-11: 500 mg via INTRAVENOUS
  Filled 2017-10-11: qty 50

## 2017-10-11 MED ORDER — DEXAMETHASONE SODIUM PHOSPHATE 10 MG/ML IJ SOLN
10.0000 mg | Freq: Once | INTRAMUSCULAR | Status: AC
  Administered 2017-10-11: 10 mg via INTRAVENOUS
  Filled 2017-10-11: qty 1

## 2017-10-11 MED ORDER — SODIUM CHLORIDE 0.9 % IV SOLN: 375.0000 mg/m2 | Freq: Once | INTRAVENOUS | Status: DC

## 2017-10-11 MED ORDER — HEPARIN SOD (PORK) LOCK FLUSH 100 UNIT/ML IV SOLN
500.0000 [IU] | Freq: Once | INTRAVENOUS | Status: AC | PRN
  Administered 2017-10-11: 500 [IU]
  Filled 2017-10-11: qty 5

## 2017-10-11 MED ORDER — MORPHINE SULFATE ER 15 MG PO TBCR: 15.0000 mg | EXTENDED_RELEASE_TABLET | Freq: Two times a day (BID) | ORAL | 0 refills | Status: DC

## 2017-10-11 MED ORDER — SODIUM CHLORIDE 0.9 % IV SOLN
10.0000 mg | Freq: Once | INTRAVENOUS | Status: DC
Start: 2017-10-11 — End: 2017-10-11

## 2017-10-11 MED ORDER — SODIUM CHLORIDE 0.9 % IV SOLN
1000.0000 mg | Freq: Once | INTRAVENOUS | Status: AC
  Administered 2017-10-11: 1000 mg via INTRAVENOUS
  Filled 2017-10-11: qty 50

## 2017-10-11 MED ORDER — DIPHENHYDRAMINE HCL 25 MG PO CAPS
25.0000 mg | ORAL_CAPSULE | Freq: Once | ORAL | Status: AC
  Administered 2017-10-11: 25 mg via ORAL
  Filled 2017-10-11: qty 1

## 2017-10-11 MED ORDER — ALTEPLASE 2 MG IJ SOLR
2.0000 mg | Freq: Once | INTRAMUSCULAR | Status: AC | PRN
  Administered 2017-10-11: 2 mg
  Filled 2017-10-11: qty 2

## 2017-10-11 MED ORDER — SODIUM CHLORIDE 0.9% FLUSH
10.0000 mL | INTRAVENOUS | Status: DC | PRN
  Filled 2017-10-11: qty 10

## 2017-10-11 MED ORDER — PEGFILGRASTIM 6 MG/0.6ML ~~LOC~~ PSKT
6.0000 mg | PREFILLED_SYRINGE | Freq: Once | SUBCUTANEOUS | Status: AC
  Administered 2017-10-11: 6 mg via SUBCUTANEOUS
  Filled 2017-10-11: qty 0.6

## 2017-10-11 MED ORDER — DOXORUBICIN HCL CHEMO IV INJECTION 2 MG/ML
50.0000 mg/m2 | Freq: Once | INTRAVENOUS | Status: AC
  Administered 2017-10-11: 72 mg via INTRAVENOUS
  Filled 2017-10-11: qty 36

## 2017-10-11 MED ORDER — HYDROCODONE-ACETAMINOPHEN 5-325 MG PO TABS
2.0000 | ORAL_TABLET | Freq: Four times a day (QID) | ORAL | 0 refills | Status: DC | PRN
Start: 2017-10-11 — End: 2017-11-13

## 2017-10-15 ENCOUNTER — Telehealth: Payer: Self-pay | Admitting: *Deleted

## 2017-10-15 NOTE — Telephone Encounter (Signed)
Daughter called to report that the On Pro malfunctioned Saturday and patient did not get her Neulasta injection. Asking if she can still get her injection. I spoke with infusion nurse Vicky who states it is too late to get it. Please advise

## 2017-10-15 NOTE — Telephone Encounter (Signed)
Veronica Boyd is correct and Neulasta should be given within 72 hours of chemo.  Her next treatment is not for 3 weeks, so she should be okay.  Please ensure she has a lab only encounter for CBC this Friday to monitor.

## 2017-10-15 NOTE — Telephone Encounter (Signed)
Shirlean Mylar called back and will have patient here Friday AM 8:15 for lab and I asked that she bring the injector with her

## 2017-10-18 ENCOUNTER — Inpatient Hospital Stay: Payer: Medicare Other

## 2017-10-18 ENCOUNTER — Telehealth: Payer: Self-pay | Admitting: *Deleted

## 2017-10-18 DIAGNOSIS — C8208 Follicular lymphoma grade I, lymph nodes of multiple sites: Secondary | ICD-10-CM

## 2017-10-18 LAB — COMPREHENSIVE METABOLIC PANEL
ALT: 12 U/L — AB (ref 14–54)
AST: 18 U/L (ref 15–41)
Albumin: 3.5 g/dL (ref 3.5–5.0)
Alkaline Phosphatase: 38 U/L (ref 38–126)
Anion gap: 8 (ref 5–15)
BUN: 11 mg/dL (ref 6–20)
CHLORIDE: 99 mmol/L — AB (ref 101–111)
CO2: 27 mmol/L (ref 22–32)
Calcium: 9.2 mg/dL (ref 8.9–10.3)
Creatinine, Ser: 0.64 mg/dL (ref 0.44–1.00)
Glucose, Bld: 170 mg/dL — ABNORMAL HIGH (ref 65–99)
POTASSIUM: 3.5 mmol/L (ref 3.5–5.1)
Sodium: 134 mmol/L — ABNORMAL LOW (ref 135–145)
Total Bilirubin: 1 mg/dL (ref 0.3–1.2)
Total Protein: 6 g/dL — ABNORMAL LOW (ref 6.5–8.1)

## 2017-10-18 LAB — CBC WITH DIFFERENTIAL/PLATELET
Basophils Absolute: 0 10*3/uL (ref 0–0.1)
Basophils Relative: 0 %
Eosinophils Absolute: 0 10*3/uL (ref 0–0.7)
Eosinophils Relative: 6 %
HEMATOCRIT: 30.8 % — AB (ref 35.0–47.0)
HEMOGLOBIN: 10.6 g/dL — AB (ref 12.0–16.0)
LYMPHS ABS: 0.2 10*3/uL — AB (ref 1.0–3.6)
LYMPHS PCT: 85 %
MCH: 33.3 pg (ref 26.0–34.0)
MCHC: 34.3 g/dL (ref 32.0–36.0)
MCV: 97.2 fL (ref 80.0–100.0)
MONOS PCT: 8 %
Monocytes Absolute: 0 10*3/uL — ABNORMAL LOW (ref 0.2–0.9)
NEUTROS PCT: 1 %
Neutro Abs: 0 10*3/uL — ABNORMAL LOW (ref 1.4–6.5)
PLATELETS: 65 10*3/uL — AB (ref 150–440)
RBC: 3.17 MIL/uL — AB (ref 3.80–5.20)
RDW: 18.6 % — ABNORMAL HIGH (ref 11.5–14.5)
WBC: 0.2 10*3/uL — AB (ref 3.6–11.0)

## 2017-10-18 NOTE — Telephone Encounter (Signed)
Pt notified of wbc and anc of 0.0. Pt called in earlier in the week to let us know that her Neulasta OnPro malfunctioned and she did not receive the medication. Pt advised about neutropenic precautions and when to call back to clinic for fevers, new onset of symptoms.

## 2017-10-29 NOTE — Progress Notes (Signed)
[place]  [phone] [phone]  ID: Veronica Boyd OB: May 05, 1946  MR#: [id number]  LKG#:[id number]  Patient Care Team: Glendon Axe, MD as PCP - General (Internal Medicine) Leonie Green, MD as Referring Physician (Surgery)  CHIEF COMPLAINT: Progressive follicular lymphoma  INTERVAL HISTORY: Patient returns to clinic today for further evaluation and consideration of cycle 2 of R-CHOP chemotherapy.  Her pain has resolved and her right breast is no longer enlarged.  She does not complain of weakness or fatigue. She continues to have a mild chronic cough that is improving and no longer complains of shortness of breath. She has no neurologic complaints. She denies any fevers, night sweats, or weight loss. She has no chest pain. Her appetite is improved and she denies any nausea, vomiting, constipation, or diarrhea. She has no urinary complaints. Patient offers no specific complaints today.  REVIEW OF SYSTEMS:   Review of Systems  Constitutional: Negative.  Negative for fever, malaise/fatigue and weight loss.  Respiratory: Positive for cough. Negative for hemoptysis and shortness of breath.   Cardiovascular: Negative.  Negative for chest pain and leg swelling.  Gastrointestinal: Negative.  Negative for abdominal pain, blood in stool, melena, nausea and vomiting.  Genitourinary: Negative.   Musculoskeletal: Negative.  Negative for back pain and joint pain.  Skin: Negative.  Negative for rash.  Neurological: Negative for sensory change and weakness.  Psychiatric/Behavioral: Negative.  The patient is not nervous/anxious and does not have insomnia.    As per HPI. Otherwise, a complete review of systems is negative.   PAST MEDICAL HISTORY: Past Medical History:  Diagnosis Date  . Arthritis   . Cataract    bilateral  . Chicken pox   . Colon polyp   . Follicular lymphoma ([place]) 08/2016   lymph nodes   . Hyperlipidemia   . Osteoporosis      PAST SURGICAL HISTORY: Past Surgical History:  Procedure Laterality Date  . AXILLARY LYMPH NODE DISSECTION Right 08/21/2016   Procedure: AXILLARY LYMPH NODE excision;  Surgeon: Leonie Green, MD;  Location: [facility];  Service: General;  Laterality: Right;  . CATARACT EXTRACTION, BILATERAL Bilateral   . JOINT REPLACEMENT Left   . PORTA CATH INSERTION N/A 09/16/2017   Procedure: PORTA CATH INSERTION;  Surgeon: Algernon Huxley, MD;  Location: [place] [facility];  Service: Cardiovascular;  Laterality: N/A;  . TONSILLECTOMY    . TOTAL HIP ARTHROPLASTY Left 1992    FAMILY HISTORY: Family History  Problem Relation Age of Onset  . Diabetes Sister   . Lung cancer Brother   . Diabetes Brother   . Basal cell carcinoma Daughter   . Breast cancer Paternal Aunt     ADVANCED DIRECTIVES (Y/N):  N  HEALTH MAINTENANCE: Social History   Tobacco Use  . Smoking status: Never Smoker  . Smokeless tobacco: Never Used  Substance Use Topics  . Alcohol use: No  . Drug use: No     Colonoscopy:  PAP:  Bone density:  Lipid panel:  No Known Allergies  Current Outpatient Medications  Medication Sig Dispense Refill  . albuterol (PROVENTIL HFA;VENTOLIN HFA) 108 (90 Base) MCG/ACT inhaler Inhale 2 puffs into the lungs every 6 (six) hours as needed for wheezing or shortness of breath. 1 Inhaler 2  . alendronate (FOSAMAX) 70 MG tablet Take 70 mg by mouth once a week. Take with a full glass of water on an empty stomach.    . chlorpheniramine-HYDROcodone (TUSSIONEX) 10-8 MG/5ML SUER Take  5 mLs by mouth every 12 (twelve) hours as needed for cough. 240 mL 0  . Cholecalciferol (VITAMIN D-3) 1000 units CAPS Take 1,000 Units by mouth daily.    . diphenoxylate-atropine (LOMOTIL) 2.5-0.025 MG tablet Take 1 tablet by mouth 4 (four) times daily as needed for diarrhea or loose stools. 45 tablet 0  . HYDROcodone-acetaminophen (NORCO/VICODIN) 5-325 MG tablet Take 2 tablets by mouth every 6 (six) hours as  needed for moderate pain. 60 tablet 0  . lidocaine-prilocaine (EMLA) cream Apply 1 application as needed topically. 30 g 0  . megestrol (MEGACE) 40 MG tablet Take 1 tablet (40 mg total) by mouth daily. 30 tablet 2  . morphine (MS CONTIN) 15 MG 12 hr tablet Take 1 tablet (15 mg total) by mouth every 12 (twelve) hours. 60 tablet 0  . Multiple Vitamins-Minerals (CENTRUM SILVER PO) Take 1 tablet by mouth daily.    . ondansetron (ZOFRAN) 8 MG tablet Take 1 tablet (8 mg total) 2 (two) times daily as needed by mouth for refractory nausea / vomiting. 60 tablet 2  . ondansetron (ZOFRAN-ODT) 8 MG disintegrating tablet Take 1 tablet (8 mg total) by mouth every 8 (eight) hours as needed for nausea or vomiting. 30 tablet 2  . pantoprazole (PROTONIX) 20 MG tablet Take 1 tablet by mouth daily.    . prochlorperazine (COMPAZINE) 10 MG tablet Take 1 tablet (10 mg total) every 6 (six) hours as needed by mouth (Nausea or vomiting). 60 tablet 2  . simvastatin (ZOCOR) 20 MG tablet Take 1 tablet by mouth at bedtime.     Marland Kitchen tiZANidine (ZANAFLEX) 2 MG tablet Take 1 tablet by mouth at bedtime as needed.      No current facility-administered medications for this visit.    Facility-Administered Medications Ordered in Other Visits  Medication Dose Route Frequency Provider Last Rate Last Dose  . heparin lock flush 100 unit/mL  500 Units Intravenous Once Lloyd Huger, MD      . yttrium-90 injection 07.6 millicurie  15.1 millicurie Intravenous Once Chrystal, Eulas Post, MD        OBJECTIVE: Vitals:   10/31/17 0917  BP: 134/62  Pulse: (!) 106  Resp: 18  Temp: 98.7 F (37.1 C)     Body mass index is 20.44 kg/m.    ECOG FS:0 - Asymptomatic  General: Well-developed, well-nourished, no acute distress. Eyes: Pink conjunctiva, anicteric sclera. Breasts: Right breast significantly reduced in size.. Lungs: Clear to auscultation bilaterally. Heart: Regular rate and rhythm. No rubs, murmurs, or gallops. Abdomen: Soft,  nontender, nondistended. No organomegaly noted, normoactive bowel sounds. Musculoskeletal: No edema, cyanosis, or clubbing. Neuro: Alert, answering all questions appropriately. Cranial nerves grossly intact. Skin: No rashes or petechiae noted. Psych: Normal affect. Lymphatics: Easily palpable lymphadenopathy in right axilla.  No palpable supraclavicular or cervical adenopathy.  LAB RESULTS:  Lab Results  Component Value Date   NA 137 10/31/2017   K 3.1 (L) 10/31/2017   CL 103 10/31/2017   CO2 25 10/31/2017   GLUCOSE 122 (H) 10/31/2017   BUN 7 10/31/2017   CREATININE 0.63 10/31/2017   CALCIUM 9.0 10/31/2017   PROT 6.2 (L) 10/31/2017   ALBUMIN 3.2 (L) 10/31/2017   AST 25 10/31/2017   ALT 11 (L) 10/31/2017   ALKPHOS 49 10/31/2017   BILITOT 0.5 10/31/2017   GFRNONAA >60 10/31/2017   GFRAA >60 10/31/2017    Lab Results  Component Value Date   WBC 3.0 (L) 10/31/2017   NEUTROABS 1.6 10/31/2017  HGB 9.8 (L) 10/31/2017   HCT 28.3 (L) 10/31/2017   MCV 98.7 10/31/2017   PLT 238 10/31/2017     STUDIES: Dg Fluoro Guide Cv Line Right  Result Date: 10/10/2017 CLINICAL DATA:  check patency of port due to lack of blood return, patient receiving RCHOP EXAM: PORT  CATHETER INJECTION UNDER FLUOROSCOPY TECHNIQUE: The procedure, risks (including but not limited to bleeding, infection, organ damage ), benefits, and alternatives were explained to the patient. Questions regarding the procedure were encouraged and answered. The patient understands and consents to the procedure. Survey fluoroscopic inspection reveals normal position of the right IJ port catheter, tip distal SVC. Tubing intact. Injection demonstrates partially encasing fibrin sheath around the tip of the catheter. Contrast flows easily into the SVC which appears widely patent. No extravasation. IMPRESSION: 1. Partially encasing fibrin sheath around the tip of the catheter in the distal SVC. Catheter is okay for effusion. PLAN: 1.  Catheter can be used for infusion now. 2. Patient is scheduled for CathFlow dwell protocol treatment tomorrow 8am at the cancer center. 1. Electronically Signed   By: Lucrezia Europe M.D.   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  PLAN:    1. Recurrent follicular lymphoma: CT scan results from September 12, 2017 reviewed independently revealing progressive disease despite receiving Zevalin on July 30, 2017. Pretreatment MUGA on September 18, 2017 revealed an EF of 54%.  Patient did not receive Neulasta after cycle 2 secondary to malfunctioning On-Pro mechanism.  Proceed with cycle 3 of R CHOP chemotherapy with Neulasta support today.   Return to clinic in 3 weeks for further evaluation and consideration of cycle 4.  Plan to reimage after 4 cycles.  Will likely continue treatment 2 cycles beyond best response. 2. Hyperglycemia: Improved. 3. Shoulder pain: Improved.  CT scan results as above.  Likely secondary to progressive disease.  Treatment as above. 4. Shortness of breath/cough: Improved. 5.  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  Treatment as above.   Approximately 30 minutes was spent in discussion of which greater than 50% was consultation.  Patient expressed understanding and was in agreement with this plan. She also understands that She can call clinic at any time with any questions, concerns, or complaints.   Cancer Staging Follicular lymphoma ([place]) Staging form: Lymphoid Neoplasms, AJCC 6th Edition - Clinical stage from 08/27/2016: Stage IIE (lymphoma only) - Signed by Lloyd Huger, MD on 08/27/2016   Lloyd Huger, MD   10/31/2017 9:46 AM

## 2017-10-31 ENCOUNTER — Inpatient Hospital Stay: Payer: Medicare Other

## 2017-10-31 ENCOUNTER — Inpatient Hospital Stay (HOSPITAL_BASED_OUTPATIENT_CLINIC_OR_DEPARTMENT_OTHER): Payer: Medicare Other | Admitting: Oncology

## 2017-10-31 VITALS — BP 134/62 | HR 106 | Temp 98.7°F | Resp 18 | Wt 101.2 lb

## 2017-10-31 DIAGNOSIS — Z79899 Other long term (current) drug therapy: Secondary | ICD-10-CM

## 2017-10-31 DIAGNOSIS — R05 Cough: Secondary | ICD-10-CM

## 2017-10-31 DIAGNOSIS — R5383 Other fatigue: Secondary | ICD-10-CM | POA: Diagnosis not present

## 2017-10-31 DIAGNOSIS — Z7689 Persons encountering health services in other specified circumstances: Secondary | ICD-10-CM

## 2017-10-31 DIAGNOSIS — R739 Hyperglycemia, unspecified: Secondary | ICD-10-CM | POA: Diagnosis not present

## 2017-10-31 DIAGNOSIS — E785 Hyperlipidemia, unspecified: Secondary | ICD-10-CM | POA: Diagnosis not present

## 2017-10-31 DIAGNOSIS — Z803 Family history of malignant neoplasm of breast: Secondary | ICD-10-CM

## 2017-10-31 DIAGNOSIS — N6310 Unspecified lump in the right breast, unspecified quadrant: Secondary | ICD-10-CM | POA: Diagnosis not present

## 2017-10-31 DIAGNOSIS — M25511 Pain in right shoulder: Secondary | ICD-10-CM | POA: Diagnosis not present

## 2017-10-31 DIAGNOSIS — R531 Weakness: Secondary | ICD-10-CM

## 2017-10-31 DIAGNOSIS — Z808 Family history of malignant neoplasm of other organs or systems: Secondary | ICD-10-CM

## 2017-10-31 DIAGNOSIS — C8208 Follicular lymphoma grade I, lymph nodes of multiple sites: Secondary | ICD-10-CM | POA: Diagnosis not present

## 2017-10-31 DIAGNOSIS — Z801 Family history of malignant neoplasm of trachea, bronchus and lung: Secondary | ICD-10-CM

## 2017-10-31 LAB — CBC WITH DIFFERENTIAL/PLATELET
Basophils Absolute: 0 10*3/uL (ref 0–0.1)
Basophils Relative: 1 %
EOS ABS: 0 10*3/uL (ref 0–0.7)
Eosinophils Relative: 0 %
HEMATOCRIT: 28.3 % — AB (ref 35.0–47.0)
HEMOGLOBIN: 9.8 g/dL — AB (ref 12.0–16.0)
LYMPHS ABS: 0.3 10*3/uL — AB (ref 1.0–3.6)
Lymphocytes Relative: 11 %
MCH: 34.3 pg — AB (ref 26.0–34.0)
MCHC: 34.8 g/dL (ref 32.0–36.0)
MCV: 98.7 fL (ref 80.0–100.0)
Monocytes Absolute: 1 10*3/uL — ABNORMAL HIGH (ref 0.2–0.9)
Monocytes Relative: 33 %
NEUTROS ABS: 1.6 10*3/uL (ref 1.4–6.5)
NEUTROS PCT: 55 %
Platelets: 238 10*3/uL (ref 150–440)
RBC: 2.87 MIL/uL — AB (ref 3.80–5.20)
RDW: 18.3 % — ABNORMAL HIGH (ref 11.5–14.5)
WBC: 3 10*3/uL — AB (ref 3.6–11.0)

## 2017-10-31 LAB — COMPREHENSIVE METABOLIC PANEL
ALT: 11 U/L — ABNORMAL LOW (ref 14–54)
ANION GAP: 9 (ref 5–15)
AST: 25 U/L (ref 15–41)
Albumin: 3.2 g/dL — ABNORMAL LOW (ref 3.5–5.0)
Alkaline Phosphatase: 49 U/L (ref 38–126)
BUN: 7 mg/dL (ref 6–20)
CHLORIDE: 103 mmol/L (ref 101–111)
CO2: 25 mmol/L (ref 22–32)
CREATININE: 0.63 mg/dL (ref 0.44–1.00)
Calcium: 9 mg/dL (ref 8.9–10.3)
Glucose, Bld: 122 mg/dL — ABNORMAL HIGH (ref 65–99)
POTASSIUM: 3.1 mmol/L — AB (ref 3.5–5.1)
SODIUM: 137 mmol/L (ref 135–145)
Total Bilirubin: 0.5 mg/dL (ref 0.3–1.2)
Total Protein: 6.2 g/dL — ABNORMAL LOW (ref 6.5–8.1)

## 2017-10-31 MED ORDER — SODIUM CHLORIDE 0.9 % IV SOLN
375.0000 mg/m2 | Freq: Once | INTRAVENOUS | Status: AC
  Administered 2017-10-31: 500 mg via INTRAVENOUS
  Filled 2017-10-31: qty 50

## 2017-10-31 MED ORDER — HEPARIN SOD (PORK) LOCK FLUSH 100 UNIT/ML IV SOLN
500.0000 [IU] | Freq: Once | INTRAVENOUS | Status: AC
  Administered 2017-10-31: 500 [IU] via INTRAVENOUS
  Filled 2017-10-31: qty 5

## 2017-10-31 MED ORDER — DIPHENHYDRAMINE HCL 25 MG PO CAPS
25.0000 mg | ORAL_CAPSULE | Freq: Once | ORAL | Status: AC
  Administered 2017-10-31: 25 mg via ORAL
  Filled 2017-10-31: qty 1

## 2017-10-31 MED ORDER — DOXORUBICIN HCL CHEMO IV INJECTION 2 MG/ML
50.0000 mg/m2 | Freq: Once | INTRAVENOUS | Status: AC
  Administered 2017-10-31: 72 mg via INTRAVENOUS
  Filled 2017-10-31: qty 36

## 2017-10-31 MED ORDER — CYCLOPHOSPHAMIDE CHEMO INJECTION 1 GM
1000.0000 mg | Freq: Once | INTRAMUSCULAR | Status: AC
  Administered 2017-10-31: 1000 mg via INTRAVENOUS
  Filled 2017-10-31: qty 50

## 2017-10-31 MED ORDER — SODIUM CHLORIDE 0.9 % IV SOLN
Freq: Once | INTRAVENOUS | Status: AC
  Administered 2017-10-31: 10:00:00 via INTRAVENOUS
  Filled 2017-10-31: qty 1000

## 2017-10-31 MED ORDER — VINCRISTINE SULFATE CHEMO INJECTION 1 MG/ML
2.0000 mg | Freq: Once | INTRAVENOUS | Status: AC
  Administered 2017-10-31: 2 mg via INTRAVENOUS
  Filled 2017-10-31: qty 2

## 2017-10-31 MED ORDER — PEGFILGRASTIM 6 MG/0.6ML ~~LOC~~ PSKT
6.0000 mg | PREFILLED_SYRINGE | Freq: Once | SUBCUTANEOUS | Status: AC
  Administered 2017-10-31: 6 mg via SUBCUTANEOUS
  Filled 2017-10-31: qty 0.6

## 2017-10-31 MED ORDER — SODIUM CHLORIDE 0.9 % IV SOLN: 375.0000 mg/m2 | Freq: Once | INTRAVENOUS | Status: DC

## 2017-10-31 MED ORDER — ACETAMINOPHEN 325 MG PO TABS
650.0000 mg | ORAL_TABLET | Freq: Once | ORAL | Status: AC
Start: 2017-10-31 — End: 2017-10-31
  Administered 2017-10-31: 650 mg via ORAL
  Filled 2017-10-31: qty 2

## 2017-10-31 MED ORDER — SODIUM CHLORIDE 0.9% FLUSH
10.0000 mL | Freq: Once | INTRAVENOUS | Status: AC
  Administered 2017-10-31: 10 mL via INTRAVENOUS
  Filled 2017-10-31: qty 10

## 2017-10-31 MED ORDER — HEPARIN SOD (PORK) LOCK FLUSH 100 UNIT/ML IV SOLN: 500.0000 [IU] | Freq: Once | INTRAVENOUS | Status: DC | PRN

## 2017-10-31 MED ORDER — PALONOSETRON HCL INJECTION 0.25 MG/5ML
0.2500 mg | Freq: Once | INTRAVENOUS | Status: AC
  Administered 2017-10-31: 0.25 mg via INTRAVENOUS
  Filled 2017-10-31: qty 5

## 2017-10-31 MED ORDER — DEXAMETHASONE SODIUM PHOSPHATE 100 MG/10ML IJ SOLN: 10.0000 mg | Freq: Once | INTRAMUSCULAR | Status: DC

## 2017-10-31 MED ORDER — DEXAMETHASONE SODIUM PHOSPHATE 10 MG/ML IJ SOLN
10.0000 mg | Freq: Once | INTRAMUSCULAR | Status: AC
  Administered 2017-10-31: 10 mg via INTRAVENOUS
  Filled 2017-10-31: qty 1

## 2017-10-31 NOTE — Progress Notes (Signed)
Nutrition Assessment   Reason for Assessment:   MD referral for weight loss and poor appetite  ASSESSMENT:  71 year old female with follicular lymphoma.  Past medical history of HLD and osteoporosis.  Patient currently receiving R CHOP chemotherapy.    Met with patient during infusion today.  Patient reports appetite is pretty good.  Reports that she eats oatmeal or cereal or eggs or pancakes for breakfast in the morning.  Usually most of a sandwich for lunch and hamburger for supper or meat and some vegetables. Patient reports some nausea during the week of chemotherapy.   Has not tried ensure/boost products yet but samples given today.  No other nutrition impact symptoms reported today  Nutrition Focused Physical Exam: deferred  Medications: megace, zofran, compazine, MVI, protonix, Vit D3.    Labs: Na 134, glucose 170  Anthropometrics:   Height: 4 ft 11 inches Weight: 104 lb UBW: 122 lb in Sept 2018 BMI: 21  15% weight loss in the last 3 months   Estimated Energy Needs  Kcals: 1400-1600 calories/d Protein: 70-80 g/d Fluid: 1.6 L/d  NUTRITION DIAGNOSIS: Malnutrition related to cancer and cancer related treatment as evidenced by 15% weight loss in the last 3 months and eating < 75% of estimated needs for > or equal to 1 month   MALNUTRITION DIAGNOSIS: Patient meets criteria for severe malnutrition in context of chronic illness as evidenced by 15% weight loss in 3 months and eating < 75% of energy needs for > or equal to 1 month   INTERVENTION:   Discussed ways to increase calories and protein. Handout provided Discussed oral nutrition supplements and encouraged 1-2 per day.  Coupons given as well as samples    MONITORING, EVALUATION, GOAL: weight trends, intake   NEXT VISIT: Jan 17 during infusion  Veronica Boyd B. Zenia Resides, [place], [place] Registered Dietitian [phone] (pager)

## 2017-11-13 ENCOUNTER — Other Ambulatory Visit: Payer: Self-pay | Admitting: *Deleted

## 2017-11-13 MED ORDER — HYDROCODONE-ACETAMINOPHEN 5-325 MG PO TABS: 2.0000 | ORAL_TABLET | Freq: Four times a day (QID) | ORAL | 0 refills | Status: DC | PRN

## 2017-11-13 MED ORDER — MORPHINE SULFATE ER 15 MG PO TBCR: 15.0000 mg | EXTENDED_RELEASE_TABLET | Freq: Two times a day (BID) | ORAL | 0 refills | Status: DC

## 2017-11-17 NOTE — Progress Notes (Signed)
[place]  [phone] Fax:(336) [phone]  ID: Veronica Boyd OB: 06-30-1946  MR#: [id number]  AOZ#:[id number]  Patient Care Team: Glendon Axe, MD as PCP - General (Internal Medicine) Leonie Green, MD as Referring Physician (Surgery)  CHIEF COMPLAINT: Progressive follicular lymphoma  INTERVAL HISTORY: Patient returns to clinic today for further evaluation and consideration of cycle 4 of R-CHOP chemotherapy.  Her shortness of breath has improved.  Her pain has resolved and her right breast is no longer enlarged.  She has mildly increased weakness and fatigue. She has no neurologic complaints. She denies any fevers, night sweats, or weight loss. She has no chest pain. Her appetite is improved and she denies any nausea, vomiting, constipation, or diarrhea. She has no urinary complaints. Patient offers no further specific complaints today.  REVIEW OF SYSTEMS:   Review of Systems  Constitutional: Positive for malaise/fatigue. Negative for fever and weight loss.  Respiratory: Positive for cough and shortness of breath. Negative for hemoptysis.   Cardiovascular: Negative.  Negative for chest pain and leg swelling.  Gastrointestinal: Negative.  Negative for abdominal pain, blood in stool, melena, nausea and vomiting.  Genitourinary: Negative.   Musculoskeletal: Negative.  Negative for back pain and joint pain.  Skin: Negative.  Negative for rash.  Neurological: Positive for weakness. Negative for sensory change.  Psychiatric/Behavioral: Negative.  The patient is not nervous/anxious and does not have insomnia.    As per HPI. Otherwise, a complete review of systems is negative.   PAST MEDICAL HISTORY: Past Medical History:  Diagnosis Date  . Arthritis   . Cataract    bilateral  . Chicken pox   . Colon polyp   . Follicular lymphoma ([place]) 08/2016   lymph nodes   . Hyperlipidemia   . Osteoporosis     PAST SURGICAL HISTORY: Past Surgical  History:  Procedure Laterality Date  . AXILLARY LYMPH NODE DISSECTION Right 08/21/2016   Procedure: AXILLARY LYMPH NODE excision;  Surgeon: Leonie Green, MD;  Location: [facility];  Service: General;  Laterality: Right;  . CATARACT EXTRACTION, BILATERAL Bilateral   . JOINT REPLACEMENT Left   . PORTA CATH INSERTION N/A 09/16/2017   Procedure: PORTA CATH INSERTION;  Surgeon: Algernon Huxley, MD;  Location: [place] [facility];  Service: Cardiovascular;  Laterality: N/A;  . TONSILLECTOMY    . TOTAL HIP ARTHROPLASTY Left 1992    FAMILY HISTORY: Family History  Problem Relation Age of Onset  . Diabetes Sister   . Lung cancer Brother   . Diabetes Brother   . Basal cell carcinoma Daughter   . Breast cancer Paternal Aunt     ADVANCED DIRECTIVES (Y/N):  N  HEALTH MAINTENANCE: Social History   Tobacco Use  . Smoking status: Never Smoker  . Smokeless tobacco: Never Used  Substance Use Topics  . Alcohol use: No  . Drug use: No     Colonoscopy:  PAP:  Bone density:  Lipid panel:  No Known Allergies  Current Outpatient Medications  Medication Sig Dispense Refill  . albuterol (PROVENTIL HFA;VENTOLIN HFA) 108 (90 Base) MCG/ACT inhaler Inhale 2 puffs into the lungs every 6 (six) hours as needed for wheezing or shortness of breath. 1 Inhaler 2  . alendronate (FOSAMAX) 70 MG tablet Take 70 mg by mouth once a week. Take with a full glass of water on an empty stomach.    . bacitracin-polymyxin b (POLYSPORIN) ophthalmic ointment Place 1 application into the left eye at bedtime. apply to left  eye lash line after washing at bedtime 3.5 g 0  . chlorpheniramine-HYDROcodone (TUSSIONEX) 10-8 MG/5ML SUER Take 5 mLs by mouth every 12 (twelve) hours as needed for cough. 240 mL 0  . Cholecalciferol (VITAMIN D-3) 1000 units CAPS Take 1,000 Units by mouth daily.    . diphenoxylate-atropine (LOMOTIL) 2.5-0.025 MG tablet Take 1 tablet by mouth 4 (four) times daily as needed for diarrhea or loose  stools. 45 tablet 0  . HYDROcodone-acetaminophen (NORCO/VICODIN) 5-325 MG tablet Take 2 tablets by mouth every 6 (six) hours as needed for moderate pain. 60 tablet 0  . lidocaine-prilocaine (EMLA) cream Apply 1 application as needed topically. 30 g 0  . megestrol (MEGACE) 40 MG tablet Take 1 tablet (40 mg total) by mouth daily. 30 tablet 2  . morphine (MS CONTIN) 15 MG 12 hr tablet Take 1 tablet (15 mg total) by mouth every 12 (twelve) hours. 60 tablet 0  . Multiple Vitamins-Minerals (CENTRUM SILVER PO) Take 1 tablet by mouth daily.    . ondansetron (ZOFRAN) 8 MG tablet Take 1 tablet (8 mg total) 2 (two) times daily as needed by mouth for refractory nausea / vomiting. 60 tablet 2  . ondansetron (ZOFRAN-ODT) 8 MG disintegrating tablet Take 1 tablet (8 mg total) by mouth every 8 (eight) hours as needed for nausea or vomiting. 30 tablet 2  . pantoprazole (PROTONIX) 20 MG tablet Take 1 tablet by mouth daily.    . predniSONE (DELTASONE) 20 MG tablet Take 5 tablets by mouth daily for 5 days after chemotherapy 25 tablet 2  . prochlorperazine (COMPAZINE) 10 MG tablet Take 1 tablet (10 mg total) every 6 (six) hours as needed by mouth (Nausea or vomiting). 60 tablet 2  . simvastatin (ZOCOR) 20 MG tablet Take 1 tablet by mouth at bedtime.     Marland Kitchen tiZANidine (ZANAFLEX) 2 MG tablet Take 1 tablet by mouth at bedtime as needed.      No current facility-administered medications for this visit.    Facility-Administered Medications Ordered in Other Visits  Medication Dose Route Frequency Provider Last Rate Last Dose  . yttrium-90 injection 09.2 millicurie  33.0 millicurie Intravenous Once Chrystal, Eulas Post, MD        OBJECTIVE: Vitals:   11/21/17 0831  BP: 128/80  Pulse: (!) 117  Resp: 20  Temp: 98.1 F (36.7 C)     Body mass index is 20.64 kg/m.    ECOG FS:1 - Symptomatic but completely ambulatory  General: Well-developed, well-nourished, no acute distress. Eyes: Pink conjunctiva, anicteric  sclera. Breasts: Right breast without lymphedema. Lungs: Clear to auscultation bilaterally. Heart: Regular rate and rhythm. No rubs, murmurs, or gallops. Abdomen: Soft, nontender, nondistended. No organomegaly noted, normoactive bowel sounds. Musculoskeletal: No edema, cyanosis, or clubbing. Neuro: Alert, answering all questions appropriately. Cranial nerves grossly intact. Skin: No rashes or petechiae noted. Psych: Normal affect. Lymphatics: Easily palpable lymphadenopathy in right axilla.  No palpable supraclavicular or cervical adenopathy.  LAB RESULTS:  Lab Results  Component Value Date   NA 137 11/21/2017   K 3.7 11/21/2017   CL 105 11/21/2017   CO2 24 11/21/2017   GLUCOSE 119 (H) 11/21/2017   BUN 7 11/21/2017   CREATININE 0.56 11/21/2017   CALCIUM 9.1 11/21/2017   PROT 6.3 (L) 11/21/2017   ALBUMIN 3.5 11/21/2017   AST 31 11/21/2017   ALT 13 (L) 11/21/2017   ALKPHOS 48 11/21/2017   BILITOT 0.5 11/21/2017   GFRNONAA >60 11/21/2017   GFRAA >60 11/21/2017    Lab  Results  Component Value Date   WBC 3.1 (L) 11/21/2017   NEUTROABS 1.9 11/21/2017   HGB 9.2 (L) 11/21/2017   HCT 28.0 (L) 11/21/2017   MCV 105.8 (H) 11/21/2017   PLT 196 11/21/2017     STUDIES: Dg Chest 2 View  Result Date: 11/18/2017 CLINICAL DATA:  Shortness of breath for 1 week in a patient with a history of lung cancer. EXAM: CHEST  2 VIEW COMPARISON:  PA and lateral chest 07/29/2017.  CT chest 09/12/2017. FINDINGS: There is some scarring in the right upper lobe. Lungs otherwise clear. Heart size is normal. No pneumothorax or pleural effusion. Port-A-Cath is in place. Pectus deformity is noted. IMPRESSION: No acute disease. Electronically Signed   By: Inge Rise M.D.   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  PLAN:    1. Recurrent follicular lymphoma: CT scan results from September 12, 2017 reviewed independently revealing progressive disease  despite receiving Zevalin on July 30, 2017. Pretreatment MUGA on September 18, 2017 revealed an EF of 54%. Proceed with cycle 4 of R CHOP chemotherapy with Neulasta support today.   Return to clinic in 3 weeks for further evaluation and consideration of cycle 5.  We will reimage with CT scans 1-2 days prior to her next treatment.  Will likely continue treatment 2 cycles beyond best response. 2. Hyperglycemia: Improved. 3. Shoulder pain: Improved.  CT scan results as above.  Likely secondary to progressive disease.  Treatment as above. 4. Shortness of breath/cough: Improved. 5.  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  Treatment as above. 6.  Leukopenia: Secondary to chemotherapy, monitor.  Neulasta as above.   Patient expressed understanding and was in agreement with this plan. She also understands that She can call clinic at any time with any questions, concerns, or complaints.   Cancer Staging Follicular lymphoma ([place]) Staging form: Lymphoid Neoplasms, AJCC 6th Edition - Clinical stage from 08/27/2016: Stage IIE (lymphoma only) - Signed by Lloyd Huger, MD on 08/27/2016   Lloyd Huger, MD   11/24/2017 8:39 AM

## 2017-11-18 ENCOUNTER — Inpatient Hospital Stay (HOSPITAL_BASED_OUTPATIENT_CLINIC_OR_DEPARTMENT_OTHER): Payer: Medicare Other | Admitting: Nurse Practitioner

## 2017-11-18 ENCOUNTER — Telehealth: Payer: Self-pay | Admitting: *Deleted

## 2017-11-18 ENCOUNTER — Ambulatory Visit
Admission: RE | Admit: 2017-11-18 | Discharge: 2017-11-18 | Disposition: A | Discharge status: Home / Self Care | Payer: Medicare Other | Source: Ambulatory Visit | Attending: Nurse Practitioner | Admitting: Nurse Practitioner

## 2017-11-18 ENCOUNTER — Inpatient Hospital Stay: Payer: Medicare Other | Attending: Nurse Practitioner

## 2017-11-18 VITALS — BP 128/70 | HR 107 | Temp 99.2°F | Resp 18 | Wt 98.2 lb

## 2017-11-18 DIAGNOSIS — R05 Cough: Secondary | ICD-10-CM

## 2017-11-18 DIAGNOSIS — Z5189 Encounter for other specified aftercare: Secondary | ICD-10-CM

## 2017-11-18 DIAGNOSIS — D649 Anemia, unspecified: Secondary | ICD-10-CM

## 2017-11-18 DIAGNOSIS — R0602 Shortness of breath: Secondary | ICD-10-CM | POA: Diagnosis present

## 2017-11-18 DIAGNOSIS — H01006 Unspecified blepharitis left eye, unspecified eyelid: Secondary | ICD-10-CM | POA: Insufficient documentation

## 2017-11-18 DIAGNOSIS — R739 Hyperglycemia, unspecified: Secondary | ICD-10-CM | POA: Diagnosis not present

## 2017-11-18 DIAGNOSIS — C8298 Follicular lymphoma, unspecified, lymph nodes of multiple sites: Secondary | ICD-10-CM | POA: Insufficient documentation

## 2017-11-18 DIAGNOSIS — D701 Agranulocytosis secondary to cancer chemotherapy: Secondary | ICD-10-CM | POA: Insufficient documentation

## 2017-11-18 DIAGNOSIS — R0609 Other forms of dyspnea: Secondary | ICD-10-CM | POA: Insufficient documentation

## 2017-11-18 DIAGNOSIS — Z5112 Encounter for antineoplastic immunotherapy: Secondary | ICD-10-CM | POA: Insufficient documentation

## 2017-11-18 DIAGNOSIS — R531 Weakness: Secondary | ICD-10-CM | POA: Diagnosis not present

## 2017-11-18 DIAGNOSIS — I890 Lymphedema, not elsewhere classified: Secondary | ICD-10-CM | POA: Diagnosis not present

## 2017-11-18 DIAGNOSIS — R5383 Other fatigue: Secondary | ICD-10-CM | POA: Diagnosis not present

## 2017-11-18 DIAGNOSIS — Z5111 Encounter for antineoplastic chemotherapy: Secondary | ICD-10-CM | POA: Diagnosis present

## 2017-11-18 DIAGNOSIS — C8208 Follicular lymphoma grade I, lymph nodes of multiple sites: Secondary | ICD-10-CM

## 2017-11-18 DIAGNOSIS — H01004 Unspecified blepharitis left upper eyelid: Secondary | ICD-10-CM

## 2017-11-18 LAB — COMPREHENSIVE METABOLIC PANEL
ALT: 12 U/L — AB (ref 14–54)
AST: 28 U/L (ref 15–41)
Albumin: 3.6 g/dL (ref 3.5–5.0)
Alkaline Phosphatase: 46 U/L (ref 38–126)
Anion gap: 9 (ref 5–15)
BILIRUBIN TOTAL: 0.5 mg/dL (ref 0.3–1.2)
BUN: 8 mg/dL (ref 6–20)
CALCIUM: 9.3 mg/dL (ref 8.9–10.3)
CO2: 24 mmol/L (ref 22–32)
CREATININE: 0.69 mg/dL (ref 0.44–1.00)
Chloride: 103 mmol/L (ref 101–111)
Glucose, Bld: 95 mg/dL (ref 65–99)
Potassium: 4.4 mmol/L (ref 3.5–5.1)
Sodium: 136 mmol/L (ref 135–145)
TOTAL PROTEIN: 6.2 g/dL — AB (ref 6.5–8.1)

## 2017-11-18 LAB — CBC WITH DIFFERENTIAL/PLATELET
Basophils Absolute: 0 10*3/uL (ref 0–0.1)
Basophils Relative: 1 %
EOS ABS: 0 10*3/uL (ref 0–0.7)
EOS PCT: 0 %
HCT: 27.5 % — ABNORMAL LOW (ref 35.0–47.0)
HEMOGLOBIN: 9 g/dL — AB (ref 12.0–16.0)
LYMPHS ABS: 0.4 10*3/uL — AB (ref 1.0–3.6)
Lymphocytes Relative: 7 %
MCH: 34.4 pg — AB (ref 26.0–34.0)
MCHC: 32.8 g/dL (ref 32.0–36.0)
MCV: 104.9 fL — ABNORMAL HIGH (ref 80.0–100.0)
MONO ABS: 1.3 10*3/uL — AB (ref 0.2–0.9)
MONOS PCT: 26 %
Neutro Abs: 3.1 10*3/uL (ref 1.4–6.5)
Neutrophils Relative %: 66 %
PLATELETS: 165 10*3/uL (ref 150–440)
RBC: 2.62 MIL/uL — ABNORMAL LOW (ref 3.80–5.20)
RDW: 17.5 % — AB (ref 11.5–14.5)
WBC: 4.8 10*3/uL (ref 3.6–11.0)

## 2017-11-18 LAB — IRON AND TIBC
Iron: 75 ug/dL (ref 28–170)
SATURATION RATIOS: 25 % (ref 10.4–31.8)
TIBC: 297 ug/dL (ref 250–450)
UIBC: 222 ug/dL

## 2017-11-18 LAB — FERRITIN: FERRITIN: 750 ng/mL — AB (ref 11–307)

## 2017-11-18 LAB — MAGNESIUM: Magnesium: 1.9 mg/dL (ref 1.7–2.4)

## 2017-11-18 LAB — VITAMIN B12: VITAMIN B 12: 2360 pg/mL — AB (ref 180–914)

## 2017-11-18 LAB — SAMPLE TO BLOOD BANK

## 2017-11-18 LAB — FOLATE: Folate: 14.1 ng/mL (ref >5.9)

## 2017-11-18 MED ORDER — ALBUTEROL SULFATE HFA 108 (90 BASE) MCG/ACT IN AERS: 2.0000 | INHALATION_SPRAY | Freq: Four times a day (QID) | RESPIRATORY_TRACT | 2 refills | Status: DC | PRN

## 2017-11-18 MED ORDER — BACITRACIN-POLYMYXIN B 500-10000 UNIT/GM OP OINT
1.0000 | TOPICAL_OINTMENT | Freq: Every day | OPHTHALMIC | 0 refills | Status: DC
Start: 2017-11-18 — End: 2017-12-27

## 2017-11-18 NOTE — Patient Instructions (Signed)
Blepharitis  Blepharitis is inflammation of the eyelids. Blepharitis may happen with:   Reddish, scaly skin around the scalp and eyebrows.   Burning or itching of the eyelids.   Eye discharge at night that causes the eyelashes to stick together in the morning.   Eyelashes that fall out.   Sensitivity to light.    Follow these instructions at home:  Pay attention to any changes in how you look or feel. Follow these instructions to help with your condition:  Keeping Clean   Wash your hands often.   Wash your eyelids with warm water or with warm water that is mixed with a small amount of baby shampoo. Do this two times per day or as often as needed.   Wash your face and eyebrows at least once a day.   Use a clean towel each time you dry your eyelids. Do not use this towel to clean or dry other areas of your body. Do not share your towel with anyone.  General instructions   Avoid wearing makeup until you get better. Do not share makeup with anyone.   Avoid rubbing your eyes.   Apply warm compresses to your eyes 2 times per day for 10 minutes at a time, or as told by your health care provider.   If you were prescribed an antibiotic ointment or steroid drops, apply or use the medicine as told by your health care provider. Do not stop using the medicine even if you feel better.   Keep all follow-up visits as told by your health care provider. This is important.  Contact a health care provider if:   Your eyelids feel hot.   You have blisters or a rash on your eyelids.   The condition does not go away in 2-4 days.   The inflammation gets worse.  Get help right away if:   You have pain or redness that gets worse or spreads to other parts of your face.   Your vision changes.   You have pain when looking at lights or moving objects.   You have a fever.  This information is not intended to replace advice given to you by your health care provider. Make sure you discuss any questions you have with your health  care provider.  Document Released: 10/19/2000 Document Revised: 03/29/2016 Document Reviewed: 02/14/2015  Elsevier Interactive Patient Education  2018 [employer]

## 2017-11-18 NOTE — Progress Notes (Signed)
Symptom Management Consult note [facility]  Telephone:([phone] [phone]  Patient Care Team: Glendon Axe, MD as PCP - General (Internal Medicine) Leonie Green, MD as Referring Physician (Surgery)   Name of the patient: Veronica Boyd  [id number]  September 30, 1946   Date of visit: 11/18/17  Diagnosis-progressive follicular lymphoma  Chief complaint/ Reason for visit- shortness of breath  Heme/Onc history: Patient last evaluated by primary oncologist, Dr. Grayland Ormond, on 10/31/17.  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  CT results 09/12/17 showed progressive disease despite Zevalin on 07/30/17. Pretreatment MUGA 09/18/17 showed EF 54%. Currently on R CHOP chemotherapy with Neulasta support with plan to reimage after 4 cycles with plan for 6 cycles total. 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 She has completed XRT to painful right lymph node.   Interval history-patient presents to symptom management clinic today with complaints of increasing shortness of breath and fatigue.  She states that the symptoms of gradually worsened the past month and she is now noticed significant shortness of breath when ambulating short distances.  Symptoms interfere with her ability to carry out her ADLs.  Symptoms are worsened with exertion and improves with rest. She has some chronic cough which is stable and unchanged.   ECOG FS:1 - Symptomatic but completely ambulatory  Review of systems- Review of Systems  Constitutional: Positive for malaise/fatigue and weight loss. Negative for chills and fever.  HENT: Negative for congestion, sinus pain and sore throat.   Eyes: Negative.   Respiratory: Positive for cough and shortness of breath (with exertion. No SOB at rest). Negative for hemoptysis, sputum production and wheezing.     Cardiovascular: Negative.   Gastrointestinal: Negative.   Genitourinary: Negative.   Musculoskeletal: Negative.   Skin: Negative for itching and rash.  Neurological: Positive for weakness. Negative for dizziness, tingling, loss of consciousness and headaches.  Endo/Heme/Allergies: Negative.   Psychiatric/Behavioral: The patient is nervous/anxious.      Current treatment- s/p cycle 3 of R CHOP chemotherapy with Neulasta OnPro support (10/31/17)  No Known Allergies   Past Medical History:  Diagnosis Date  . Arthritis   . Cataract    bilateral  . Chicken pox   . Colon polyp   . Follicular lymphoma ([place]) 08/2016   lymph nodes   . Hyperlipidemia   . Osteoporosis      Past Surgical History:  Procedure Laterality Date  . AXILLARY LYMPH NODE DISSECTION Right 08/21/2016   Procedure: AXILLARY LYMPH NODE excision;  Surgeon: Leonie Green, MD;  Location: [facility] ORS;  Service: General;  Laterality: Right;  . CATARACT EXTRACTION, BILATERAL Bilateral   . JOINT REPLACEMENT Left   . PORTA CATH INSERTION N/A 09/16/2017   Procedure: PORTA CATH INSERTION;  Surgeon: Algernon Huxley, MD;  Location: [place] [facility];  Service: Cardiovascular;  Laterality: N/A;  . TONSILLECTOMY    . TOTAL HIP ARTHROPLASTY Left 1992    Social History   Socioeconomic History  . Marital status: Married    Spouse name: Not on file  . Number of children: Not on file  . Years of education: Not on file  . Highest education level: Not on file  Social Needs  . Financial resource strain: Not on file  . Food insecurity - worry: Not on file  . Food insecurity - inability: Not on file  . Transportation  needs - medical: Not on file  . Transportation needs - non-medical: Not on file  Occupational History  . Not on file  Tobacco Use  . Smoking status: Never Smoker  . Smokeless tobacco: Never Used  Substance and Sexual Activity  . Alcohol use: No  . Drug use: No  . Sexual activity: Not on file  Other  Topics Concern  . Not on file  Social History Narrative  . Not on file    Family History  Problem Relation Age of Onset  . Diabetes Sister   . Lung cancer Brother   . Diabetes Brother   . Basal cell carcinoma Daughter   . Breast cancer Paternal Aunt      Current Outpatient Medications:  .  albuterol (PROVENTIL HFA;VENTOLIN HFA) 108 (90 Base) MCG/ACT inhaler, Inhale 2 puffs into the lungs every 6 (six) hours as needed for wheezing or shortness of breath., Disp: 1 Inhaler, Rfl: 2 .  alendronate (FOSAMAX) 70 MG tablet, Take 70 mg by mouth once a week. Take with a full glass of water on an empty stomach., Disp: , Rfl:  .  chlorpheniramine-HYDROcodone (TUSSIONEX) 10-8 MG/5ML SUER, Take 5 mLs by mouth every 12 (twelve) hours as needed for cough., Disp: 240 mL, Rfl: 0 .  Cholecalciferol (VITAMIN D-3) 1000 units CAPS, Take 1,000 Units by mouth daily., Disp: , Rfl:  .  diphenoxylate-atropine (LOMOTIL) 2.5-0.025 MG tablet, Take 1 tablet by mouth 4 (four) times daily as needed for diarrhea or loose stools., Disp: 45 tablet, Rfl: 0 .  HYDROcodone-acetaminophen (NORCO/VICODIN) 5-325 MG tablet, Take 2 tablets by mouth every 6 (six) hours as needed for moderate pain., Disp: 60 tablet, Rfl: 0 .  lidocaine-prilocaine (EMLA) cream, Apply 1 application as needed topically., Disp: 30 g, Rfl: 0 .  megestrol (MEGACE) 40 MG tablet, Take 1 tablet (40 mg total) by mouth daily., Disp: 30 tablet, Rfl: 2 .  morphine (MS CONTIN) 15 MG 12 hr tablet, Take 1 tablet (15 mg total) by mouth every 12 (twelve) hours., Disp: 60 tablet, Rfl: 0 .  Multiple Vitamins-Minerals (CENTRUM SILVER PO), Take 1 tablet by mouth daily., Disp: , Rfl:  .  ondansetron (ZOFRAN) 8 MG tablet, Take 1 tablet (8 mg total) 2 (two) times daily as needed by mouth for refractory nausea / vomiting., Disp: 60 tablet, Rfl: 2 .  ondansetron (ZOFRAN-ODT) 8 MG disintegrating tablet, Take 1 tablet (8 mg total) by mouth every 8 (eight) hours as needed for  nausea or vomiting., Disp: 30 tablet, Rfl: 2 .  pantoprazole (PROTONIX) 20 MG tablet, Take 1 tablet by mouth daily., Disp: , Rfl:  .  prochlorperazine (COMPAZINE) 10 MG tablet, Take 1 tablet (10 mg total) every 6 (six) hours as needed by mouth (Nausea or vomiting)., Disp: 60 tablet, Rfl: 2 .  simvastatin (ZOCOR) 20 MG tablet, Take 1 tablet by mouth at bedtime. , Disp: , Rfl:  .  tiZANidine (ZANAFLEX) 2 MG tablet, Take 1 tablet by mouth at bedtime as needed. , Disp: , Rfl:  No current facility-administered medications for this visit.   Facility-Administered Medications Ordered in Other Visits:  .  yttrium-90 injection 13.2 millicurie, 44.0 millicurie, Intravenous, Once, Chrystal, Eulas Post, MD  Physical exam:  Vitals:   11/18/17 1102  BP: 128/70  Pulse: (!) 107  Resp: 18  Temp: 99.2 F (37.3 C)  TempSrc: Tympanic  SpO2: 100%  Weight: 98 lb 3.2 oz (44.5 kg)    General: Elderly, thin, female in no acute distress. Accompanied by  family.  Eyes: Pink conjunctiva, anicteric sclera. Crusting of eyelashes on left eye, eyelid pink.  Lungs: Clear to auscultation bilaterally. Speaking in full sentences Heart: Regular rate and rhythm. No rubs, murmurs, or gallops. Abdomen: Soft, nontender, nondistended. No organomegaly noted, normoactive bowel sounds. Musculoskeletal: No edema, cyanosis, or clubbing. Neuro: Alert, answering all questions appropriately. Cranial nerves grossly intact. Skin: No rashes or petechiae noted. Psych: Normal affect.    CMP Latest Ref Rng & Units 11/18/2017  Glucose 65 - 99 mg/dL 95  BUN 6 - 20 mg/dL 8  Creatinine 0.44 - 1.00 mg/dL 0.69  Sodium 135 - 145 mmol/L 136  Potassium 3.5 - 5.1 mmol/L 4.4  Chloride 101 - 111 mmol/L 103  CO2 22 - 32 mmol/L 24  Calcium 8.9 - 10.3 mg/dL 9.3  Total Protein 6.5 - 8.1 g/dL 6.2(L)  Total Bilirubin 0.3 - 1.2 mg/dL 0.5  Alkaline Phos 38 - 126 U/L 46  AST 15 - 41 U/L 28  ALT 14 - 54 U/L 12(L)   CBC Latest Ref Rng & Units  11/18/2017  WBC 3.6 - 11.0 K/uL 4.8  Hemoglobin 12.0 - 16.0 g/dL 9.0(L)  Hematocrit 35.0 - 47.0 % 27.5(L)  Platelets 150 - 440 K/uL 165    No images are attached to the encounter.  Dg Chest 2 View  Result Date: 11/18/2017 CLINICAL DATA:  Shortness of breath for 1 week in a patient with a history of lung cancer. EXAM: CHEST  2 VIEW COMPARISON:  PA and lateral chest 07/29/2017.  CT chest 09/12/2017. FINDINGS: There is some scarring in the right upper lobe. Lungs otherwise clear. Heart size is normal. No pneumothorax or pleural effusion. Port-A-Cath is in place. Pectus deformity is noted. IMPRESSION: No acute disease. Electronically Signed   By: Inge Rise M.D.   On: 11/18/2017 13:58     Assessment and plan- Patient is a 72 y.o. female who presents to Symptom Management for dyspnea on exertion  1. Recurrent follicular lymphoma- s/p cycle 3 R CHOP chemotherapy with Neulasta support. Plans to reimage after 4 cycles. Plan for total of 6 cycles.   2. DOE- gradually worsening. Limiting ADLs. No cough. Chest x-ray shows no evidence of pneumonia. Lungs clear. Suspect r/t progressively worsening anemia (see discussion below). Will provide with albuterol inhaler.   3. Anemia- Hmg 9.0- gradually trending down. Macrocytic. B12 2,360, Folate 14.1. Retic 7.5; labs consistent with anemia r/t chemotherapy. No need for transfusion at this time. Continue to monitor.  4. Blepharitis- crusting of left eyelashes. Will send polysporin ophthalmic ointment, continue warm compresses, wash with baby shampoo.    RTC for regularly scheduled follow-up with Dr. Grayland Ormond.   Visit Diagnosis 1. 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 ([place])   2. Symptomatic anemia   3. Blepharitis of left upper eyelid, unspecified type     Patient expressed understanding and was in agreement with this plan. She also understands that She can call clinic at any time with any questions, concerns, or complaints.  Patient advised to notify the clinic if there is no improvement in symptoms or if symptoms worsen in next 3-4 days.     Beckey Rutter, DNP, AGNP-C [place] at [facility] [phone] (office) 11/18/17 11:14 AM

## 2017-11-18 NOTE — Telephone Encounter (Signed)
Daughter called to report that patient has sever shortness of breath when she walks 20 feet, she has to sit and rest to get her breath. Denies cough or fever. Appointment given to see NP and have labs and she agrees to bring her in at 1015 this morning.

## 2017-11-19 ENCOUNTER — Other Ambulatory Visit: Payer: Self-pay

## 2017-11-19 DIAGNOSIS — D649 Anemia, unspecified: Secondary | ICD-10-CM

## 2017-11-19 LAB — RETICULOCYTES
RBC.: 2.68 MIL/uL — AB (ref 3.80–5.20)
RETIC CT PCT: 7.5 % — AB (ref 0.4–3.1)
Retic Count, Absolute: 201 10*3/uL — ABNORMAL HIGH (ref 19.0–183.0)

## 2017-11-20 ENCOUNTER — Telehealth: Payer: Self-pay | Admitting: *Deleted

## 2017-11-20 MED ORDER — PREDNISONE 20 MG PO TABS: ORAL_TABLET | ORAL | 2 refills | Status: DC

## 2017-11-20 NOTE — Telephone Encounter (Signed)
Daughter Shirlean Mylar called stating the Prednisone has not been sent to pharmacy for patient. I do not see it ordered. Please advise

## 2017-11-21 ENCOUNTER — Inpatient Hospital Stay: Payer: Medicare Other

## 2017-11-21 ENCOUNTER — Inpatient Hospital Stay (HOSPITAL_BASED_OUTPATIENT_CLINIC_OR_DEPARTMENT_OTHER): Payer: Medicare Other | Admitting: Oncology

## 2017-11-21 ENCOUNTER — Encounter: Payer: Self-pay | Admitting: Oncology

## 2017-11-21 ENCOUNTER — Other Ambulatory Visit: Payer: Self-pay

## 2017-11-21 ENCOUNTER — Encounter: Payer: Self-pay | Admitting: Nurse Practitioner

## 2017-11-21 VITALS — BP 128/80 | HR 117 | Temp 98.1°F | Resp 20 | Wt 102.2 lb

## 2017-11-21 VITALS — BP 102/65 | HR 92 | Temp 97.2°F | Resp 18

## 2017-11-21 DIAGNOSIS — R739 Hyperglycemia, unspecified: Secondary | ICD-10-CM

## 2017-11-21 DIAGNOSIS — R5383 Other fatigue: Secondary | ICD-10-CM | POA: Diagnosis not present

## 2017-11-21 DIAGNOSIS — I890 Lymphedema, not elsewhere classified: Secondary | ICD-10-CM | POA: Diagnosis not present

## 2017-11-21 DIAGNOSIS — C8208 Follicular lymphoma grade I, lymph nodes of multiple sites: Secondary | ICD-10-CM

## 2017-11-21 DIAGNOSIS — D701 Agranulocytosis secondary to cancer chemotherapy: Secondary | ICD-10-CM

## 2017-11-21 DIAGNOSIS — Z5112 Encounter for antineoplastic immunotherapy: Secondary | ICD-10-CM | POA: Diagnosis not present

## 2017-11-21 DIAGNOSIS — R0602 Shortness of breath: Secondary | ICD-10-CM

## 2017-11-21 DIAGNOSIS — R531 Weakness: Secondary | ICD-10-CM | POA: Diagnosis not present

## 2017-11-21 LAB — CBC WITH DIFFERENTIAL/PLATELET
Basophils Absolute: 0 10*3/uL (ref 0–0.1)
Basophils Relative: 0 %
EOS ABS: 0 10*3/uL (ref 0–0.7)
Eosinophils Relative: 0 %
HCT: 28 % — ABNORMAL LOW (ref 35.0–47.0)
HEMOGLOBIN: 9.2 g/dL — AB (ref 12.0–16.0)
LYMPHS ABS: 0.5 10*3/uL — AB (ref 1.0–3.6)
LYMPHS PCT: 17 %
MCH: 34.8 pg — ABNORMAL HIGH (ref 26.0–34.0)
MCHC: 32.9 g/dL (ref 32.0–36.0)
MCV: 105.8 fL — AB (ref 80.0–100.0)
MONOS PCT: 23 %
Monocytes Absolute: 0.7 10*3/uL (ref 0.2–0.9)
NEUTROS PCT: 60 %
Neutro Abs: 1.9 10*3/uL (ref 1.4–6.5)
Platelets: 196 10*3/uL (ref 150–440)
RBC: 2.65 MIL/uL — ABNORMAL LOW (ref 3.80–5.20)
RDW: 18.9 % — ABNORMAL HIGH (ref 11.5–14.5)
WBC: 3.1 10*3/uL — ABNORMAL LOW (ref 3.6–11.0)

## 2017-11-21 LAB — COMPREHENSIVE METABOLIC PANEL
ALT: 13 U/L — AB (ref 14–54)
ANION GAP: 8 (ref 5–15)
AST: 31 U/L (ref 15–41)
Albumin: 3.5 g/dL (ref 3.5–5.0)
Alkaline Phosphatase: 48 U/L (ref 38–126)
BUN: 7 mg/dL (ref 6–20)
CALCIUM: 9.1 mg/dL (ref 8.9–10.3)
CO2: 24 mmol/L (ref 22–32)
CREATININE: 0.56 mg/dL (ref 0.44–1.00)
Chloride: 105 mmol/L (ref 101–111)
Glucose, Bld: 119 mg/dL — ABNORMAL HIGH (ref 65–99)
Potassium: 3.7 mmol/L (ref 3.5–5.1)
Sodium: 137 mmol/L (ref 135–145)
Total Bilirubin: 0.5 mg/dL (ref 0.3–1.2)
Total Protein: 6.3 g/dL — ABNORMAL LOW (ref 6.5–8.1)

## 2017-11-21 MED ORDER — HEPARIN SOD (PORK) LOCK FLUSH 100 UNIT/ML IV SOLN
500.0000 [IU] | Freq: Once | INTRAVENOUS | Status: AC
  Administered 2017-11-21: 500 [IU] via INTRAVENOUS
  Filled 2017-11-21: qty 5

## 2017-11-21 MED ORDER — SODIUM CHLORIDE 0.9 % IV SOLN: 375.0000 mg/m2 | Freq: Once | INTRAVENOUS | Status: DC

## 2017-11-21 MED ORDER — PEGFILGRASTIM 6 MG/0.6ML ~~LOC~~ PSKT
6.0000 mg | PREFILLED_SYRINGE | Freq: Once | SUBCUTANEOUS | Status: AC
  Administered 2017-11-21: 6 mg via SUBCUTANEOUS
  Filled 2017-11-21: qty 0.6

## 2017-11-21 MED ORDER — SODIUM CHLORIDE 0.9 % IV SOLN
Freq: Once | INTRAVENOUS | Status: AC
  Administered 2017-11-21: 09:00:00 via INTRAVENOUS
  Filled 2017-11-21: qty 1000

## 2017-11-21 MED ORDER — DIPHENHYDRAMINE HCL 25 MG PO CAPS
25.0000 mg | ORAL_CAPSULE | Freq: Once | ORAL | Status: AC
  Administered 2017-11-21: 25 mg via ORAL
  Filled 2017-11-21: qty 1

## 2017-11-21 MED ORDER — SODIUM CHLORIDE 0.9 % IV SOLN: 10.0000 mg | Freq: Once | INTRAVENOUS | Status: DC

## 2017-11-21 MED ORDER — SODIUM CHLORIDE 0.9 % IV SOLN
2.0000 mg | Freq: Once | INTRAVENOUS | Status: AC
  Administered 2017-11-21: 2 mg via INTRAVENOUS
  Filled 2017-11-21: qty 2

## 2017-11-21 MED ORDER — SODIUM CHLORIDE 0.9 % IV SOLN
1000.0000 mg | Freq: Once | INTRAVENOUS | Status: AC
  Administered 2017-11-21: 1000 mg via INTRAVENOUS
  Filled 2017-11-21: qty 50

## 2017-11-21 MED ORDER — PALONOSETRON HCL INJECTION 0.25 MG/5ML
0.2500 mg | Freq: Once | INTRAVENOUS | Status: AC
  Administered 2017-11-21: 0.25 mg via INTRAVENOUS
  Filled 2017-11-21: qty 5

## 2017-11-21 MED ORDER — DEXAMETHASONE SODIUM PHOSPHATE 10 MG/ML IJ SOLN
10.0000 mg | Freq: Once | INTRAMUSCULAR | Status: AC
  Administered 2017-11-21: 10 mg via INTRAVENOUS
  Filled 2017-11-21: qty 1

## 2017-11-21 MED ORDER — SODIUM CHLORIDE 0.9 % IV SOLN
375.0000 mg/m2 | Freq: Once | INTRAVENOUS | Status: AC
  Administered 2017-11-21: 500 mg via INTRAVENOUS
  Filled 2017-11-21: qty 50

## 2017-11-21 MED ORDER — DOXORUBICIN HCL CHEMO IV INJECTION 2 MG/ML
50.0000 mg/m2 | Freq: Once | INTRAVENOUS | Status: AC
  Administered 2017-11-21: 72 mg via INTRAVENOUS
  Filled 2017-11-21: qty 36

## 2017-11-21 MED ORDER — SODIUM CHLORIDE 0.9% FLUSH
10.0000 mL | INTRAVENOUS | Status: DC | PRN
  Administered 2017-11-21 (×2): 10 mL via INTRAVENOUS
  Filled 2017-11-21: qty 10

## 2017-11-21 MED ORDER — ACETAMINOPHEN 325 MG PO TABS
650.0000 mg | ORAL_TABLET | Freq: Once | ORAL | Status: AC
  Administered 2017-11-21: 650 mg via ORAL
  Filled 2017-11-21: qty 2

## 2017-11-21 MED ORDER — HEPARIN SOD (PORK) LOCK FLUSH 100 UNIT/ML IV SOLN: 500.0000 [IU] | Freq: Once | INTRAVENOUS | Status: DC | PRN

## 2017-11-21 NOTE — Progress Notes (Signed)
Patient denies any concerns today.  

## 2017-11-21 NOTE — Progress Notes (Signed)
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  Patient receiving chemotherapy.    Met with patient and daughter in infusion today.  Patient reports appetite is good one day then not very good the next day.  Reports food taste bad.  No nausea reported.  Noted had issue with increased shortness of breath.  Patient reports she did not like ensure/boost samples that were given at last visit.    Medications: megace, predinsone  Labs: glucose 119  Anthropometrics:   Weight today 102 lb 3. 2 oz (daughter reports patient wearing heavy coat when weighed). Last weight 98 lb 3.2 oz on 1/14 Weight on 104 lb on 12/27   NUTRITION DIAGNOSIS: Malnutrition continues   MALNUTRITION DIAGNOSIS: Severe malnutrition continues   INTERVENTION:   Samples given of ensure clear and discussed boost breeze as options for patient as well as carnation instant breakfast mixed with whole milk since patient does not like ensure/boost drinks. Offered recipes to make homemade shakes and patient declined.  "I want go to all that trouble." Encouraged small frequent meals Discussed with patient and daughter ways to add calories and protein to current eating pattern.  Examples provided.    MONITORING, EVALUATION, GOAL: weight trends, intake   NEXT VISIT: Feb 7 during infusion  Rolf Fells B. Zenia Resides, [place], [place] Registered Dietitian [phone] (pager)

## 2017-11-21 NOTE — Progress Notes (Signed)
Blood return noted before, every 3cc during and after Adriamycin push, blood return noted before and after Vincristine infusion.

## 2017-12-03 ENCOUNTER — Telehealth: Payer: Self-pay

## 2017-12-03 ENCOUNTER — Other Ambulatory Visit: Payer: Self-pay

## 2017-12-03 DIAGNOSIS — C8590 Non-Hodgkin lymphoma, unspecified, unspecified site: Secondary | ICD-10-CM

## 2017-12-03 NOTE — Telephone Encounter (Signed)
Patient family called to state patient is very weak; unable to get out of bed since last treatment.  She is having some increasing shortness of breath.  Not taking in much by mouth.  Scheduled the patient for labs and to see NP in [facility] tomorrow, 11/24/2017 @ 0915.

## 2017-12-04 ENCOUNTER — Inpatient Hospital Stay: Payer: Medicare Other

## 2017-12-04 ENCOUNTER — Encounter: Payer: Self-pay | Admitting: Nurse Practitioner

## 2017-12-04 ENCOUNTER — Inpatient Hospital Stay (HOSPITAL_BASED_OUTPATIENT_CLINIC_OR_DEPARTMENT_OTHER): Payer: Medicare Other | Admitting: Nurse Practitioner

## 2017-12-04 VITALS — BP 101/63 | HR 83 | Temp 99.3°F | Resp 20

## 2017-12-04 DIAGNOSIS — D6481 Anemia due to antineoplastic chemotherapy: Secondary | ICD-10-CM

## 2017-12-04 DIAGNOSIS — Z5112 Encounter for antineoplastic immunotherapy: Secondary | ICD-10-CM | POA: Diagnosis not present

## 2017-12-04 DIAGNOSIS — Z5189 Encounter for other specified aftercare: Secondary | ICD-10-CM

## 2017-12-04 DIAGNOSIS — R0602 Shortness of breath: Secondary | ICD-10-CM | POA: Diagnosis not present

## 2017-12-04 DIAGNOSIS — C8208 Follicular lymphoma grade I, lymph nodes of multiple sites: Secondary | ICD-10-CM | POA: Diagnosis not present

## 2017-12-04 DIAGNOSIS — T451X5A Adverse effect of antineoplastic and immunosuppressive drugs, initial encounter: Secondary | ICD-10-CM

## 2017-12-04 DIAGNOSIS — C8590 Non-Hodgkin lymphoma, unspecified, unspecified site: Secondary | ICD-10-CM

## 2017-12-04 LAB — CBC
HCT: 23.9 % — ABNORMAL LOW (ref 35.0–47.0)
Hemoglobin: 8 g/dL — ABNORMAL LOW (ref 12.0–16.0)
MCH: 34.7 pg — ABNORMAL HIGH (ref 26.0–34.0)
MCHC: 33.6 g/dL (ref 32.0–36.0)
MCV: 103.4 fL — AB (ref 80.0–100.0)
PLATELETS: 105 10*3/uL — AB (ref 150–440)
RBC: 2.31 MIL/uL — ABNORMAL LOW (ref 3.80–5.20)
RDW: 16.5 % — AB (ref 11.5–14.5)
WBC: 2.8 10*3/uL — AB (ref 3.6–11.0)

## 2017-12-04 LAB — COMPREHENSIVE METABOLIC PANEL
ALK PHOS: 47 U/L (ref 38–126)
ALT: 12 U/L — AB (ref 14–54)
AST: 30 U/L (ref 15–41)
Albumin: 3.4 g/dL — ABNORMAL LOW (ref 3.5–5.0)
Anion gap: 16 — ABNORMAL HIGH (ref 5–15)
BILIRUBIN TOTAL: 0.7 mg/dL (ref 0.3–1.2)
BUN: 9 mg/dL (ref 6–20)
CALCIUM: 9.1 mg/dL (ref 8.9–10.3)
CHLORIDE: 92 mmol/L — AB (ref 101–111)
CO2: 25 mmol/L (ref 22–32)
CREATININE: 0.71 mg/dL (ref 0.44–1.00)
Glucose, Bld: 116 mg/dL — ABNORMAL HIGH (ref 65–99)
Potassium: 2.9 mmol/L — ABNORMAL LOW (ref 3.5–5.1)
Sodium: 133 mmol/L — ABNORMAL LOW (ref 135–145)
Total Protein: 6.3 g/dL — ABNORMAL LOW (ref 6.5–8.1)

## 2017-12-04 LAB — MAGNESIUM: MAGNESIUM: 2.1 mg/dL (ref 1.7–2.4)

## 2017-12-04 MED ORDER — HEPARIN SOD (PORK) LOCK FLUSH 100 UNIT/ML IV SOLN
500.0000 [IU] | Freq: Once | INTRAVENOUS | Status: AC
  Administered 2017-12-04: 500 [IU] via INTRAVENOUS

## 2017-12-04 MED ORDER — SODIUM CHLORIDE 0.9 % IV SOLN
Freq: Once | INTRAVENOUS | Status: AC
  Administered 2017-12-04: 11:00:00 via INTRAVENOUS
  Filled 2017-12-04: qty 1000

## 2017-12-04 MED ORDER — SODIUM CHLORIDE 0.9% FLUSH
10.0000 mL | INTRAVENOUS | Status: AC | PRN
  Administered 2017-12-04: 10 mL via INTRAVENOUS
  Filled 2017-12-04: qty 10

## 2017-12-04 MED ORDER — HEPARIN SOD (PORK) LOCK FLUSH 100 UNIT/ML IV SOLN: 500.0000 [IU] | Freq: Once | INTRAVENOUS | Status: AC

## 2017-12-04 NOTE — Progress Notes (Signed)
Patient here today for add on in symptom management regarding shortness of breath.

## 2017-12-04 NOTE — Progress Notes (Signed)
Symptom Management Consult note [facility]  Telephone:([phone] Fax:([phone]  Patient Care Team: Glendon Axe, MD as PCP - General (Internal Medicine) Leonie Green, MD as Referring Physician (Surgery)   Name of the patient: Veronica Boyd  [id number]  December 06, 1945   Date of visit: 12/04/17  Diagnosis-progressive follicular lymphoma  Chief complaint/ Reason for visit- shortness of breath  Heme/Onc history: Patient last evaluated by primary oncologist, Dr. Grayland Ormond, on 10/31/17.  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  CT results 09/12/17 showed progressive disease despite Zevalin on 07/30/17. Pretreatment MUGA 09/18/17 showed EF 54%. Currently on R CHOP chemotherapy with Neulasta support with plan to reimage after 4 cycles with plan for 6 cycles total. 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 She has completed XRT to painful right lymph node.   Interval history-patient presents to symptom management clinic today for complaints of shortness of breath.  She states that symptoms began several weeks ago but have progressively worsened.  She states that she is increasingly short of breath when ambulating and is now only able to tolerate short distances.  Symptoms interfere with her ability to carry out her ADLs.  Symptoms are worsened with exertion and improved with rest.  Denies chest pain with symptoms.  Has chronic cough which is stable and unchanged.  Has not taken any medications for symptoms.  Was recently evaluated in symptom management for same told symptoms were likely due to anemia related to chemotherapy she was given an albuterol inhaler for acute symptoms but has not used it.  ECOG FS:2 - Symptomatic, <50% confined to bed  Review of systems- Review of Systems  Constitutional: Positive for malaise/fatigue and  weight loss. Negative for chills and fever.  HENT: Negative for congestion, sinus pain and sore throat.   Eyes: Negative.   Respiratory: Positive for cough and shortness of breath (with exertion. No SOB at rest). Negative for hemoptysis, sputum production and wheezing.   Cardiovascular: Negative.   Gastrointestinal: Negative.   Genitourinary: Negative.   Musculoskeletal: Negative.   Skin: Negative for itching and rash.  Neurological: Positive for weakness. Negative for dizziness, tingling, loss of consciousness and headaches.  Endo/Heme/Allergies: Negative.   Psychiatric/Behavioral: The patient is nervous/anxious.      Current treatment- s/p cycle 4 of R CHOP chemotherapy with Neulasta OnPro support (11/21/17)  No Known Allergies   Past Medical History:  Diagnosis Date  . Arthritis   . Cataract    bilateral  . Chicken pox   . Colon polyp   . Follicular lymphoma ([place]) 08/2016   lymph nodes   . Hyperlipidemia   . Osteoporosis      Past Surgical History:  Procedure Laterality Date  . AXILLARY LYMPH NODE DISSECTION Right 08/21/2016   Procedure: AXILLARY LYMPH NODE excision;  Surgeon: Leonie Green, MD;  Location: [facility] ORS;  Service: General;  Laterality: Right;  . CATARACT EXTRACTION, BILATERAL Bilateral   . JOINT REPLACEMENT Left   . PORTA CATH INSERTION N/A 09/16/2017   Procedure: PORTA CATH INSERTION;  Surgeon: Algernon Huxley, MD;  Location: [place] [facility];  Service: Cardiovascular;  Laterality: N/A;  . TONSILLECTOMY    . TOTAL HIP ARTHROPLASTY Left 1992    Social History   Socioeconomic History  . Marital status: Married    Spouse name: Not on file  . Number of children:  Not on file  . Years of education: Not on file  . Highest education level: Not on file  Social Needs  . Financial resource strain: Not on file  . Food insecurity - worry: Not on file  . Food insecurity - inability: Not on file  . Transportation needs - medical: Not on file  .  Transportation needs - non-medical: Not on file  Occupational History  . Not on file  Tobacco Use  . Smoking status: Never Smoker  . Smokeless tobacco: Never Used  Substance and Sexual Activity  . Alcohol use: No  . Drug use: No  . Sexual activity: Not on file  Other Topics Concern  . Not on file  Social History Narrative  . Not on file    Family History  Problem Relation Age of Onset  . Diabetes Sister   . Lung cancer Brother   . Diabetes Brother   . Basal cell carcinoma Daughter   . Breast cancer Paternal Aunt      Current Outpatient Medications:  .  albuterol (PROVENTIL HFA;VENTOLIN HFA) 108 (90 Base) MCG/ACT inhaler, Inhale 2 puffs into the lungs every 6 (six) hours as needed for wheezing or shortness of breath., Disp: 1 Inhaler, Rfl: 2 .  alendronate (FOSAMAX) 70 MG tablet, Take 70 mg by mouth once a week. Take with a full glass of water on an empty stomach., Disp: , Rfl:  .  bacitracin-polymyxin b (POLYSPORIN) ophthalmic ointment, Place 1 application into the left eye at bedtime. apply to left eye lash line after washing at bedtime, Disp: 3.5 g, Rfl: 0 .  chlorpheniramine-HYDROcodone (TUSSIONEX) 10-8 MG/5ML SUER, Take 5 mLs by mouth every 12 (twelve) hours as needed for cough., Disp: 240 mL, Rfl: 0 .  Cholecalciferol (VITAMIN D-3) 1000 units CAPS, Take 1,000 Units by mouth daily., Disp: , Rfl:  .  diphenoxylate-atropine (LOMOTIL) 2.5-0.025 MG tablet, Take 1 tablet by mouth 4 (four) times daily as needed for diarrhea or loose stools., Disp: 45 tablet, Rfl: 0 .  HYDROcodone-acetaminophen (NORCO/VICODIN) 5-325 MG tablet, Take 2 tablets by mouth every 6 (six) hours as needed for moderate pain., Disp: 60 tablet, Rfl: 0 .  lidocaine-prilocaine (EMLA) cream, Apply 1 application as needed topically., Disp: 30 g, Rfl: 0 .  megestrol (MEGACE) 40 MG tablet, Take 1 tablet (40 mg total) by mouth daily., Disp: 30 tablet, Rfl: 2 .  morphine (MS CONTIN) 15 MG 12 hr tablet, Take 1 tablet (15  mg total) by mouth every 12 (twelve) hours., Disp: 60 tablet, Rfl: 0 .  Multiple Vitamins-Minerals (CENTRUM SILVER PO), Take 1 tablet by mouth daily., Disp: , Rfl:  .  ondansetron (ZOFRAN) 8 MG tablet, Take 1 tablet (8 mg total) 2 (two) times daily as needed by mouth for refractory nausea / vomiting., Disp: 60 tablet, Rfl: 2 .  ondansetron (ZOFRAN-ODT) 8 MG disintegrating tablet, Take 1 tablet (8 mg total) by mouth every 8 (eight) hours as needed for nausea or vomiting., Disp: 30 tablet, Rfl: 2 .  pantoprazole (PROTONIX) 20 MG tablet, Take 1 tablet by mouth daily., Disp: , Rfl:  .  predniSONE (DELTASONE) 20 MG tablet, Take 5 tablets by mouth daily for 5 days after chemotherapy, Disp: 25 tablet, Rfl: 2 .  prochlorperazine (COMPAZINE) 10 MG tablet, Take 1 tablet (10 mg total) every 6 (six) hours as needed by mouth (Nausea or vomiting)., Disp: 60 tablet, Rfl: 2 .  simvastatin (ZOCOR) 20 MG tablet, Take 1 tablet by mouth at bedtime. , Disp: ,  Rfl:  .  tiZANidine (ZANAFLEX) 2 MG tablet, Take 1 tablet by mouth at bedtime as needed. , Disp: , Rfl:  No current facility-administered medications for this visit.   Facility-Administered Medications Ordered in Other Visits:  .  heparin lock flush 100 unit/mL, 500 Units, Intravenous, Once, Finnegan, Kathlene November, MD .  sodium chloride flush (NS) 0.9 % injection 10 mL, 10 mL, Intravenous, PRN, Lloyd Huger, MD, 10 mL at 12/04/17 0901 .  yttrium-90 injection 11.9 millicurie, 14.7 millicurie, Intravenous, Once, [name], MD  Physical exam:  Vitals:   12/04/17 1103 12/04/17 1105  BP: 101/63   Pulse: 83   Resp: 20   Temp: 99.3 F (37.4 C)   TempSrc: Tympanic   SpO2:  100%   GENERAL: Elderly, thin, female in no acute distress. Appears stated age. Accompanied.  HEENT:  Sclerae anicteric.  Oropharynx clear and moist. No ulcerations or evidence of oropharyngeal candidiasis. Neck is supple.   NODES:  Palpable axillary right lymphadenopathy LUNGS:   Clear to auscultation bilaterally.  No wheezes or rhonchi. Speaking in full sentences HEART:  Regular rate and rhythm. No murmur appreciated. ABDOMEN:  Soft, nontender.  Positive, normoactive bowel sounds. No organomegaly palpated. MSK:   edema, cyanosis, or clubbing. SKIN:  Clear with no obvious rashes or skin changes. No nail dyscrasia. NEURO: Nonfocal. Well oriented. Appropriate affect. PSYCH: anxious   CMP Latest Ref Rng & Units 12/04/2017  Glucose 65 - 99 mg/dL 116(H)  BUN 6 - 20 mg/dL 9  Creatinine 0.44 - 1.00 mg/dL 0.71  Sodium 135 - 145 mmol/L 133(L)  Potassium 3.5 - 5.1 mmol/L 2.9(L)  Chloride 101 - 111 mmol/L 92(L)  CO2 22 - 32 mmol/L 25  Calcium 8.9 - 10.3 mg/dL 9.1  Total Protein 6.5 - 8.1 g/dL 6.3(L)  Total Bilirubin 0.3 - 1.2 mg/dL 0.7  Alkaline Phos 38 - 126 U/L 47  AST 15 - 41 U/L 30  ALT 14 - 54 U/L 12(L)   CBC Latest Ref Rng & Units 12/04/2017  WBC 3.6 - 11.0 K/uL 2.8(L)  Hemoglobin 12.0 - 16.0 g/dL 8.0(L)  Hematocrit 35.0 - 47.0 % 23.9(L)  Platelets 150 - 440 K/uL 105(L)    No images are attached to the encounter.  Dg Chest 2 View  Result Date: 11/18/2017 CLINICAL DATA:  Shortness of breath for 1 week in a patient with a history of lung cancer. EXAM: CHEST  2 VIEW COMPARISON:  PA and lateral chest 07/29/2017.  CT chest 09/12/2017. FINDINGS: There is some scarring in the right upper lobe. Lungs otherwise clear. Heart size is normal. No pneumothorax or pleural effusion. Port-A-Cath is in place. Pectus deformity is noted. IMPRESSION: No acute disease. Electronically Signed   By: Inge Rise M.D.   On: 11/18/2017 13:58     Assessment and plan- Patient is a 72 y.o. female who presents to Symptom Management for shortness of breath.   1. Recurrent follicular lymphoma- s/p cycle 4 R CHOP chemotherapy with Neulasta support. Plans to reimage on 12/09/17 to assess response prior to 2 additional cycles.  Clinical assessment today she is improvement in right axillary  lymphadenopathy compared to previous exam but not resolved.  Cervical adenopathy has resolved.  Previous MUGA scan on 09/18/17 showed EF of 54%.  Will repeat MUGA prior to cycle 6.  Follow-up with Dr. Grayland Ormond as previously scheduled for consideration of cycle 5 of R CHOP chemotherapy with Neulasta support.  2. Anemia - Labs today showed Hmg 8.0, Hct 23.9,  MCV 103.4, MCHC 33.6, RDW 16.5, PLT 105. Previous workup shows anemia consistent with receiving chemotherapy. Suspect that symptoms today related to underlying anemia. Med-Onc to continue to monitor counts. Would consider transfusion if hmg < 7.  3. Shortness of Breath- symptoms likely r/t deconditioning and symptomatic anemia.  Oxygen saturations >94% with ambulation. Tachycardic with ambulation which returns to baseline with rest. Previous chest x-ray shows no evidence of acute process. Encouraged use of albuterol inhaler for acute symptoms. Would consider home oxygen if decreased saturations. Suspect general deconditioning contributory and family reports minimal physical activity. Will refer to CARE program for exercise and rehabilitation. Continue to follow-up with Dr. Grayland Ormond. If symptoms worsen or persist, return to clinic for further evaluation.   RTC for regularly scheduled follow-up with Dr. Grayland Ormond.   Visit Diagnosis 1. 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 ([place])   2. Anemia due to antineoplastic chemotherapy   3. Convalescence after chemotherapy   4. Exertional shortness of breath     Patient expressed understanding and was in agreement with this plan. She also understands that She can call clinic at any time with any questions, concerns, or complaints. Patient advised to notify the clinic if there is no improvement in symptoms or if symptoms worsen in next 3-4 days.    Beckey Rutter, DNP, AGNP-C [place] at [facility] [phone] (office) 12/04/17 4:32 PM

## 2017-12-09 ENCOUNTER — Ambulatory Visit
Admission: RE | Admit: 2017-12-09 | Discharge: 2017-12-09 | Disposition: A | Discharge status: Home / Self Care | Payer: Medicare Other | Source: Ambulatory Visit | Attending: Oncology | Admitting: Oncology

## 2017-12-09 DIAGNOSIS — C8208 Follicular lymphoma grade I, lymph nodes of multiple sites: Secondary | ICD-10-CM | POA: Insufficient documentation

## 2017-12-09 MED ORDER — IOPAMIDOL (ISOVUE-300) INJECTION 61%
85.0000 mL | Freq: Once | INTRAVENOUS | Status: AC | PRN
  Administered 2017-12-09: 85 mL via INTRAVENOUS

## 2017-12-12 ENCOUNTER — Inpatient Hospital Stay: Payer: Medicare Other

## 2017-12-12 ENCOUNTER — Encounter: Payer: Self-pay | Admitting: Oncology

## 2017-12-12 ENCOUNTER — Inpatient Hospital Stay (HOSPITAL_BASED_OUTPATIENT_CLINIC_OR_DEPARTMENT_OTHER): Payer: Medicare Other | Admitting: Oncology

## 2017-12-12 ENCOUNTER — Inpatient Hospital Stay: Payer: Medicare Other | Attending: Oncology

## 2017-12-12 VITALS — BP 109/69 | HR 113 | Temp 98.2°F | Resp 20 | Wt 96.9 lb

## 2017-12-12 VITALS — BP 90/57 | HR 92 | Temp 99.2°F | Resp 17

## 2017-12-12 DIAGNOSIS — Z5111 Encounter for antineoplastic chemotherapy: Secondary | ICD-10-CM | POA: Diagnosis present

## 2017-12-12 DIAGNOSIS — C8208 Follicular lymphoma grade I, lymph nodes of multiple sites: Secondary | ICD-10-CM

## 2017-12-12 DIAGNOSIS — D701 Agranulocytosis secondary to cancer chemotherapy: Secondary | ICD-10-CM

## 2017-12-12 DIAGNOSIS — I890 Lymphedema, not elsewhere classified: Secondary | ICD-10-CM | POA: Insufficient documentation

## 2017-12-12 DIAGNOSIS — Z5189 Encounter for other specified aftercare: Secondary | ICD-10-CM | POA: Diagnosis not present

## 2017-12-12 DIAGNOSIS — R739 Hyperglycemia, unspecified: Secondary | ICD-10-CM

## 2017-12-12 DIAGNOSIS — R0602 Shortness of breath: Secondary | ICD-10-CM | POA: Diagnosis not present

## 2017-12-12 DIAGNOSIS — R05 Cough: Secondary | ICD-10-CM

## 2017-12-12 DIAGNOSIS — R5383 Other fatigue: Secondary | ICD-10-CM

## 2017-12-12 DIAGNOSIS — Z5112 Encounter for antineoplastic immunotherapy: Secondary | ICD-10-CM | POA: Insufficient documentation

## 2017-12-12 LAB — COMPREHENSIVE METABOLIC PANEL
ALBUMIN: 3.4 g/dL — AB (ref 3.5–5.0)
ALT: 14 U/L (ref 14–54)
AST: 34 U/L (ref 15–41)
Alkaline Phosphatase: 48 U/L (ref 38–126)
Anion gap: 8 (ref 5–15)
BUN: 6 mg/dL (ref 6–20)
CO2: 25 mmol/L (ref 22–32)
CREATININE: 0.58 mg/dL (ref 0.44–1.00)
Calcium: 8.9 mg/dL (ref 8.9–10.3)
Chloride: 103 mmol/L (ref 101–111)
GFR calc non Af Amer: >60 mL/min (ref >60)
GLUCOSE: 123 mg/dL — AB (ref 65–99)
Potassium: 3.3 mmol/L — ABNORMAL LOW (ref 3.5–5.1)
Sodium: 136 mmol/L (ref 135–145)
Total Bilirubin: 0.5 mg/dL (ref 0.3–1.2)
Total Protein: 6.1 g/dL — ABNORMAL LOW (ref 6.5–8.1)

## 2017-12-12 LAB — CBC WITH DIFFERENTIAL/PLATELET
Basophils Absolute: 0 10*3/uL (ref 0–0.1)
Basophils Relative: 2 %
EOS ABS: 0 10*3/uL (ref 0–0.7)
Eosinophils Relative: 0 %
HEMATOCRIT: 25.4 % — AB (ref 35.0–47.0)
HEMOGLOBIN: 8.3 g/dL — AB (ref 12.0–16.0)
Lymphocytes Relative: 16 %
Lymphs Abs: 0.5 10*3/uL — ABNORMAL LOW (ref 1.0–3.6)
MCH: 35 pg — AB (ref 26.0–34.0)
MCHC: 32.8 g/dL (ref 32.0–36.0)
MCV: 106.7 fL — ABNORMAL HIGH (ref 80.0–100.0)
MONOS PCT: 19 %
Monocytes Absolute: 0.6 10*3/uL (ref 0.2–0.9)
NEUTROS PCT: 63 %
Neutro Abs: 1.9 10*3/uL (ref 1.4–6.5)
Platelets: 144 10*3/uL — ABNORMAL LOW (ref 150–440)
RBC: 2.38 MIL/uL — ABNORMAL LOW (ref 3.80–5.20)
RDW: 18.6 % — ABNORMAL HIGH (ref 11.5–14.5)
WBC: 3 10*3/uL — ABNORMAL LOW (ref 3.6–11.0)

## 2017-12-12 MED ORDER — PALONOSETRON HCL INJECTION 0.25 MG/5ML
0.2500 mg | Freq: Once | INTRAVENOUS | Status: AC
  Administered 2017-12-12: 0.25 mg via INTRAVENOUS
  Filled 2017-12-12: qty 5

## 2017-12-12 MED ORDER — DIPHENHYDRAMINE HCL 25 MG PO CAPS
25.0000 mg | ORAL_CAPSULE | Freq: Once | ORAL | Status: AC
  Administered 2017-12-12: 25 mg via ORAL
  Filled 2017-12-12: qty 1

## 2017-12-12 MED ORDER — RITUXIMAB CHEMO INJECTION 500 MG/50ML: 375.0000 mg/m2 | Freq: Once | INTRAVENOUS | Status: DC

## 2017-12-12 MED ORDER — CYCLOPHOSPHAMIDE CHEMO INJECTION 1 GM
1000.0000 mg | Freq: Once | INTRAMUSCULAR | Status: AC
  Administered 2017-12-12: 1000 mg via INTRAVENOUS
  Filled 2017-12-12: qty 50

## 2017-12-12 MED ORDER — ACETAMINOPHEN 325 MG PO TABS
650.0000 mg | ORAL_TABLET | Freq: Once | ORAL | Status: AC
  Administered 2017-12-12: 650 mg via ORAL
  Filled 2017-12-12: qty 2

## 2017-12-12 MED ORDER — SODIUM CHLORIDE 0.9 % IV SOLN
375.0000 mg/m2 | Freq: Once | INTRAVENOUS | Status: AC
  Administered 2017-12-12: 500 mg via INTRAVENOUS
  Filled 2017-12-12: qty 50

## 2017-12-12 MED ORDER — SODIUM CHLORIDE 0.9 % IV SOLN: 10.0000 mg | Freq: Once | INTRAVENOUS | Status: DC

## 2017-12-12 MED ORDER — HEPARIN SOD (PORK) LOCK FLUSH 100 UNIT/ML IV SOLN
500.0000 [IU] | Freq: Once | INTRAVENOUS | Status: AC | PRN
  Administered 2017-12-12: 500 [IU]
  Filled 2017-12-12: qty 5

## 2017-12-12 MED ORDER — SODIUM CHLORIDE 0.9 % IV SOLN
Freq: Once | INTRAVENOUS | Status: AC
  Administered 2017-12-12: 10:00:00 via INTRAVENOUS
  Filled 2017-12-12: qty 1000

## 2017-12-12 MED ORDER — PEGFILGRASTIM 6 MG/0.6ML ~~LOC~~ PSKT
6.0000 mg | PREFILLED_SYRINGE | Freq: Once | SUBCUTANEOUS | Status: AC
  Administered 2017-12-12: 6 mg via SUBCUTANEOUS
  Filled 2017-12-12: qty 0.6

## 2017-12-12 MED ORDER — DOXORUBICIN HCL CHEMO IV INJECTION 2 MG/ML
50.0000 mg/m2 | Freq: Once | INTRAVENOUS | Status: AC
  Administered 2017-12-12: 72 mg via INTRAVENOUS
  Filled 2017-12-12: qty 36

## 2017-12-12 NOTE — Progress Notes (Signed)
Per NP, no vincristine today. Order was deleted for today only

## 2017-12-12 NOTE — Progress Notes (Signed)
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  Patient receiving chemotherapy.    Met with patient and daughter today during infusion.  Patient reports not so good appetite.  Daughter reports week after chemotherapy is the worse for appetite.  Patient reports she ate cereal this am but wants to get some sausage biscuits and start eating them at home.  Reports yesterday at lunch ate cabbage, salad and baked potato. Couldn't remember supper meal.  Does not like ensure/boost shakes.  Has not tried samples of ensure clear that was given to her last time.    Report that she is taking megace although daughter reports has not taken it every day.    Medications: megace  Labs: reviewed  Anthropometrics:   Weight today 96 lb 14.4 oz decreased from 102 lb (although wearing jacket) on 1/17   NUTRITION DIAGNOSIS: Malnutrition continues   MALNUTRITION DIAGNOSIS: Severe malnutrition continues   INTERVENTION:   Encouraged patient to try samples of ensure clear that were given to her last time.  Refused recipes of homemade shakes. Discussed ways to add high calorie, high protein foods to current eating pattern (examples provided).  Also gave patient handout of high calorie, high protein foods.  Discussed at length how to put a high calorie, high protein meal together. Encouraged patient to continue taking appetite stimulant    MONITORING, EVALUATION, GOAL: weight trends, intake   NEXT VISIT: Feb , 28 during infusion  Demiya Magno B. Zenia Resides, [place], [place] Registered Dietitian [phone] (pager)

## 2017-12-12 NOTE — Progress Notes (Signed)
Patient denies any concerns today.  

## 2017-12-12 NOTE — Progress Notes (Signed)
[place]  [phone]) [phone] [phone]) [phone]  ID: Veronica Boyd OB: 05/17/1946  MR#: [id number]  EGB#:[id number]  Patient Care Team: Glendon Axe, MD as PCP - General (Internal Medicine) Leonie Green, MD as Referring Physician (Surgery)  CHIEF COMPLAINT: Progressive follicular lymphoma  INTERVAL HISTORY: Patient returns to clinic today for further evaluation and consideration of cycle 4 of R CHOP chemotherapy.  Her shortness of breath continues to improve and she has an occasional cough which is improving. She denies any more breast pain.  She continues to have a weakness and fatigue.  She denies any neurological complaints.  She denies any fevers, night sweats or weight loss.  She has no chest pain.  Her appetite continues to improve.  She has no urinary complaints.  She is here with her daughter and they are interested in her CT scan results.  REVIEW OF SYSTEMS:   Review of Systems  Constitutional: Positive for malaise/fatigue. Negative for fever and weight loss.  Respiratory: Positive for cough and shortness of breath. Negative for hemoptysis.   Cardiovascular: Negative.  Negative for chest pain and leg swelling.  Gastrointestinal: Negative.  Negative for abdominal pain, blood in stool, melena, nausea and vomiting.  Genitourinary: Negative.   Musculoskeletal: Negative.  Negative for back pain and joint pain.  Skin: Negative.  Negative for rash.  Neurological: Positive for weakness. Negative for sensory change.  Psychiatric/Behavioral: Negative.  The patient is not nervous/anxious and does not have insomnia.    As per HPI. Otherwise, a complete review of systems is negative.   PAST MEDICAL HISTORY: Past Medical History:  Diagnosis Date  . Arthritis   . Cataract    bilateral  . Chicken pox   . Colon polyp   . Follicular lymphoma ([place]) 08/2016   lymph nodes   . Hyperlipidemia   . Osteoporosis     PAST SURGICAL  HISTORY: Past Surgical History:  Procedure Laterality Date  . AXILLARY LYMPH NODE DISSECTION Right 08/21/2016   Procedure: AXILLARY LYMPH NODE excision;  Surgeon: Leonie Green, MD;  Location: [facility];  Service: General;  Laterality: Right;  . CATARACT EXTRACTION, BILATERAL Bilateral   . JOINT REPLACEMENT Left   . PORTA CATH INSERTION N/A 09/16/2017   Procedure: PORTA CATH INSERTION;  Surgeon: Algernon Huxley, MD;  Location: [facility];  Service: Cardiovascular;  Laterality: N/A;  . TONSILLECTOMY    . TOTAL HIP ARTHROPLASTY Left 1992    FAMILY HISTORY: Family History  Problem Relation Age of Onset  . Diabetes Sister   . Lung cancer Brother   . Diabetes Brother   . Basal cell carcinoma Daughter   . Breast cancer Paternal Aunt     ADVANCED DIRECTIVES (Y/N):  N  HEALTH MAINTENANCE: Social History   Tobacco Use  . Smoking status: Never Smoker  . Smokeless tobacco: Never Used  Substance Use Topics  . Alcohol use: No  . Drug use: No     Colonoscopy:  PAP:  Bone density:  Lipid panel:  No Known Allergies  Current Outpatient Medications  Medication Sig Dispense Refill  . albuterol (PROVENTIL HFA;VENTOLIN HFA) 108 (90 Base) MCG/ACT inhaler Inhale 2 puffs into the lungs every 6 (six) hours as needed for wheezing or shortness of breath. 1 Inhaler 2  . alendronate (FOSAMAX) 70 MG tablet Take 70 mg by mouth once a week. Take with a full glass of water on an empty stomach.    . bacitracin-polymyxin b (  POLYSPORIN) ophthalmic ointment Place 1 application into the left eye at bedtime. apply to left eye lash line after washing at bedtime 3.5 g 0  . chlorpheniramine-HYDROcodone (TUSSIONEX) 10-8 MG/5ML SUER Take 5 mLs by mouth every 12 (twelve) hours as needed for cough. 240 mL 0  . Cholecalciferol (VITAMIN D-3) 1000 units CAPS Take 1,000 Units by mouth daily.    . diphenoxylate-atropine (LOMOTIL) 2.5-0.025 MG tablet Take 1 tablet by mouth 4 (four) times daily as needed  for diarrhea or loose stools. 45 tablet 0  . HYDROcodone-acetaminophen (NORCO/VICODIN) 5-325 MG tablet Take 2 tablets by mouth every 6 (six) hours as needed for moderate pain. 60 tablet 0  . lidocaine-prilocaine (EMLA) cream Apply 1 application as needed topically. 30 g 0  . megestrol (MEGACE) 40 MG tablet Take 1 tablet (40 mg total) by mouth daily. 30 tablet 2  . morphine (MS CONTIN) 15 MG 12 hr tablet Take 1 tablet (15 mg total) by mouth every 12 (twelve) hours. 60 tablet 0  . Multiple Vitamins-Minerals (CENTRUM SILVER PO) Take 1 tablet by mouth daily.    . ondansetron (ZOFRAN) 8 MG tablet Take 1 tablet (8 mg total) 2 (two) times daily as needed by mouth for refractory nausea / vomiting. 60 tablet 2  . ondansetron (ZOFRAN-ODT) 8 MG disintegrating tablet Take 1 tablet (8 mg total) by mouth every 8 (eight) hours as needed for nausea or vomiting. 30 tablet 2  . pantoprazole (PROTONIX) 20 MG tablet Take 1 tablet by mouth daily.    . predniSONE (DELTASONE) 20 MG tablet Take 5 tablets by mouth daily for 5 days after chemotherapy 25 tablet 2  . prochlorperazine (COMPAZINE) 10 MG tablet Take 1 tablet (10 mg total) every 6 (six) hours as needed by mouth (Nausea or vomiting). 60 tablet 2  . simvastatin (ZOCOR) 20 MG tablet Take 1 tablet by mouth at bedtime.     Marland Kitchen tiZANidine (ZANAFLEX) 2 MG tablet Take 1 tablet by mouth at bedtime as needed.      No current facility-administered medications for this visit.    Facility-Administered Medications Ordered in Other Visits  Medication Dose Route Frequency Provider Last Rate Last Dose  . heparin lock flush 100 unit/mL  500 Units Intravenous Once Lloyd Huger, MD      . sodium chloride flush (NS) 0.9 % injection 10 mL  10 mL Intravenous PRN Lloyd Huger, MD   10 mL at 12/04/17 0901  . yttrium-90 injection 57.3 millicurie  22.0 millicurie Intravenous Once Chrystal, Eulas Post, MD        OBJECTIVE: Vitals:   12/12/17 0847  BP: 109/69  Pulse: (!) 113   Resp: 20  Temp: 98.2 F (36.8 C)     Body mass index is 19.57 kg/m.    ECOG FS:1 - Symptomatic but completely ambulatory  General: Well-developed, well-nourished, no acute distress. Eyes: Pink conjunctiva, anicteric sclera. Breasts: Right breast without lymphedema. Lungs: Clear to auscultation bilaterally. Heart: Regular rate and rhythm. No rubs, murmurs, or gallops. Abdomen: Soft, nontender, nondistended. No organomegaly noted, normoactive bowel sounds. Musculoskeletal: No edema, cyanosis, or clubbing. Neuro: Alert, answering all questions appropriately. Cranial nerves grossly intact. Skin: No rashes or petechiae noted. Psych: Normal affect. Lymphatics: Easily palpable lymphadenopathy in right axilla.  No palpable supraclavicular or cervical adenopathy.  LAB RESULTS:  Lab Results  Component Value Date   NA 136 12/12/2017   K 3.3 (L) 12/12/2017   CL 103 12/12/2017   CO2 25 12/12/2017   GLUCOSE 123 (  H) 12/12/2017   BUN 6 12/12/2017   CREATININE 0.58 12/12/2017   CALCIUM 8.9 12/12/2017   PROT 6.1 (L) 12/12/2017   ALBUMIN 3.4 (L) 12/12/2017   AST 34 12/12/2017   ALT 14 12/12/2017   ALKPHOS 48 12/12/2017   BILITOT 0.5 12/12/2017   GFRNONAA >60 12/12/2017   GFRAA >60 12/12/2017    Lab Results  Component Value Date   WBC 3.0 (L) 12/12/2017   NEUTROABS 1.9 12/12/2017   HGB 8.3 (L) 12/12/2017   HCT 25.4 (L) 12/12/2017   MCV 106.7 (H) 12/12/2017   PLT 144 (L) 12/12/2017     STUDIES: Dg Chest 2 View  Result Date: 11/18/2017 CLINICAL DATA:  Shortness of breath for 1 week in a patient with a history of lung cancer. EXAM: CHEST  2 VIEW COMPARISON:  PA and lateral chest 07/29/2017.  CT chest 09/12/2017. FINDINGS: There is some scarring in the right upper lobe. Lungs otherwise clear. Heart size is normal. No pneumothorax or pleural effusion. Port-A-Cath is in place. Pectus deformity is noted. IMPRESSION: No acute disease. Electronically Signed   By: Inge Rise M.D.    On: 11/18/2017 13:58   Ct Soft Tissue Neck W Contrast  Result Date: 12/09/2017 CLINICAL DATA:  72 y/o F; restaging of follicular lymphoma diagnosed 10/17 currently receiving chemotherapy. EXAM: CT NECK WITH CONTRAST TECHNIQUE: Multidetector CT imaging of the neck was performed using the standard protocol following the bolus administration of intravenous contrast. CONTRAST:  58m ISOVUE-300 IOPAMIDOL (ISOVUE-300) INJECTION 61% COMPARISON:  Concurrent CT of chest abdomen and pelvis. 09/12/2017 CT of the neck. 04/19/2017, 11/07/2016, 08/01/2016 PET-CT. FINDINGS: Pharynx and larynx: Normal. No mass or swelling. Salivary glands: No inflammation, mass, or stone. Thyroid: Normal. Lymph nodes: No lymphadenopathy of the neck by imaging criteria. Partially visualized bulky mass in the right axilla, correlation with concurrent CT of chest findings recommended. Vascular: Mild calcific atherosclerosis of aortic arch and left carotid bifurcation without significant stenosis. Limited intracranial: Negative. Visualized orbits: Negative. Mastoids and visualized paranasal sinuses: Clear. Skeleton: Moderate cervical spondylosis with discogenic degenerative changes greatest at the C4-C6 levels and C3-4 minimal grade 1 anterolisthesis. No acute osseous abnormality identified. Upper chest: Stable scarring in right lung apex. Other: None. IMPRESSION: 1. Decreased size of neck lymph nodes. No residual lymphadenopathy of the neck. 2. Partially visualize bulky mass in right axilla, correlation with concurrent CT of chest findings recommended. Electronically Signed   By: LKristine GarbeM.D.   On: 12/09/2017 15:53   Ct Chest W Contrast  Result Date: 12/09/2017 CLINICAL DATA:  Followup lymphoma. EXAM: CT CHEST, ABDOMEN, AND PELVIS WITH CONTRAST TECHNIQUE: Multidetector CT imaging of the chest, abdomen and pelvis was performed following the standard protocol during bolus administration of intravenous contrast. CONTRAST:  842m ISOVUE-300 IOPAMIDOL (ISOVUE-300) INJECTION 61% COMPARISON:  09/12/2017 FINDINGS: CT CHEST FINDINGS Cardiovascular: Normal heart size. No pericardial effusion identified. Aortic atherosclerosis. Mediastinum/Nodes: Normal thyroid gland. The trachea appears patent and midline. Normal appearance of the esophagus. Previous enlarged right paratracheal node measures 0.5 cm, image 17 of series 4. Previously 1.8 cm. There has been resolution of previous anterior mediastinal prevascular adenopathy. The index left internal mammary lymph node measures 0.7 cm, image 21 of series 4. Previously 1.6 cm. Index left axillary node has resolved in the interval. Previously noted left breast lesions have resolved in the interval. The large round axillary mass persists measuring 4.1 x 4.3 x 5.4 cm (volume = 50 cm^3). Previously 4.4 x 3.6 x 5.1 cm (volume =  72 cm^3). Lungs/Pleura: There has been resolution of previous right pleural effusion. Resolution of previous right-sided paraspinal soft tissue overlying the right upper lobe. Previous pleural nodularity overlying the right lung has resolved in the interval. 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 No suspicious bone lesions. CT ABDOMEN PELVIS FINDINGS Hepatobiliary: No focal liver abnormality is seen. No gallstones, gallbladder wall thickening, or biliary dilatation. Pancreas: Unremarkable. No pancreatic ductal dilatation or surrounding inflammatory changes. Spleen: Normal in size without focal abnormality. Adrenals/Urinary Tract: The adrenal glands are normal. Unremarkable appearance of both kidneys. The urinary bladder appears unremarkable. Stomach/Bowel: The stomach is normal. The small bowel loops have a normal course and caliber without obstruction. Normal appearance of the colon. Vascular/Lymphatic: Aortic atherosclerosis without aneurysm. No adenopathy within the abdomen. No pelvic or inguinal adenopathy identified. Reproductive: The uterus is  unremarkable.  No adnexal mass. Other: No free fluid or fluid collections identified. No peritoneal nodularity. Musculoskeletal: Previous left hip arthroplasty. There is a lucent lesion involving the left acetabulum measuring 3.6 by 2.3 cm. 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 IMPRESSION: 1. Continued interval response to therapy. When compared with 09/12/2017 there has been resolution of mediastinal, left axillary and left internal mammary adenopathy. The lesions within the right breast have also resolved in the interval. Resolution of right-sided pleural nodularity. 2. There is a persistent enlarged soft tissue mass within the right axilla which is slightly increased in volume compared with the previous exam and worrisome for residual tumor. Electronically Signed   By: Kerby Moors M.D.   On: 12/09/2017 16:58   Ct Abdomen Pelvis W Contrast  Result Date: 12/09/2017 CLINICAL DATA:  Followup lymphoma. EXAM: CT CHEST, ABDOMEN, AND PELVIS WITH CONTRAST TECHNIQUE: Multidetector CT imaging of the chest, abdomen and pelvis was performed following the standard protocol during bolus administration of intravenous contrast. CONTRAST:  26m ISOVUE-300 IOPAMIDOL (ISOVUE-300) INJECTION 61% COMPARISON:  09/12/2017 FINDINGS: CT CHEST FINDINGS Cardiovascular: Normal heart size. No pericardial effusion identified. Aortic atherosclerosis. Mediastinum/Nodes: Normal thyroid gland. The trachea appears patent and midline. Normal appearance of the esophagus. Previous enlarged right paratracheal node measures 0.5 cm, image 17 of series 4. Previously 1.8 cm. There has been resolution of previous anterior mediastinal prevascular adenopathy. The index left internal mammary lymph node measures 0.7 cm, image 21 of series 4. Previously 1.6 cm. Index left axillary node has resolved in the interval. Previously noted left breast lesions have resolved in the interval. The large round axillary mass persists measuring  4.1 x 4.3 x 5.4 cm (volume = 50 cm^3). Previously 4.4 x 3.6 x 5.1 cm (volume = 42 cm^3). Lungs/Pleura: There has been resolution of previous right pleural effusion. Resolution of previous right-sided paraspinal soft tissue overlying the right upper lobe. Previous pleural nodularity overlying the right lung has resolved in the interval. 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 No suspicious bone lesions. CT ABDOMEN PELVIS FINDINGS Hepatobiliary: No focal liver abnormality is seen. No gallstones, gallbladder wall thickening, or biliary dilatation. Pancreas: Unremarkable. No pancreatic ductal dilatation or surrounding inflammatory changes. Spleen: Normal in size without focal abnormality. Adrenals/Urinary Tract: The adrenal glands are normal. Unremarkable appearance of both kidneys. The urinary bladder appears unremarkable. Stomach/Bowel: The stomach is normal. The small bowel loops have a normal course and caliber without obstruction. Normal appearance of the colon. Vascular/Lymphatic: Aortic atherosclerosis without aneurysm. No adenopathy within the abdomen. No pelvic or inguinal adenopathy identified. Reproductive: The uterus is unremarkable.  No adnexal mass. Other: No  free fluid or fluid collections identified. No peritoneal nodularity. Musculoskeletal: Previous left hip arthroplasty. There is a lucent lesion involving the left acetabulum measuring 3.6 by 2.3 cm. 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 IMPRESSION: 1. Continued interval response to therapy. When compared with 09/12/2017 there has been resolution of mediastinal, left axillary and left internal mammary adenopathy. The lesions within the right breast have also resolved in the interval. Resolution of right-sided pleural nodularity. 2. There is a persistent enlarged soft tissue mass within the right axilla which is slightly increased in volume compared with the previous exam and worrisome for  residual tumor. Electronically Signed   By: Kerby Moors M.D.   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  PLAN:    1. Recurrent follicular lymphoma: Most recent CT scan from 12/09/2017 reviewed independently revealing continued interval response to therapy when compared to previous CT scan from 09/12/2017.  There has been complete resolution of the mediastinal, left axillary and left internal mammary adenopathy.  The lesion within the right breast has also resolved in the interval.  There is esolution of right-sided pleural nodularity.  There is a persistent enlarged soft tissue mass within the right axilla which is slightly increased in volume compared to previous exam and is worrisome for residual tumor. CT neck had a decreased size of neck lymph nodes and there is no residual lymphadenopathy of the neck. Pretreatment MUGA on November 14 revealed an EF of 54%.  Proceed with cycle 5 of R CHOP chemotherapy with Neulasta support today.  Return to clinic in 3 weeks for further evaluation and consideration of cycle 6.  She will need repeat MUGA prior to this chemotherapy.  We will get that scheduled today. 2. Hyperglycemia: Improved. 3. Shoulder pain: Better. Continue Pain medications as prescribed. 4. Cough: Improved. 5. 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  Treatment as above. 6. Leukopenia: Secondary to chemotherapy, monitor.  Neulasta as above. 7. RTC in 3 weeks for Cycle 6 of R-CHOP. Repeat MUGA prior.    Patient expressed understanding and was in agreement with this plan. She also understands that She can call clinic at any time with any questions, concerns, or complaints.   Cancer Staging Follicular lymphoma ([place]) Staging form: Lymphoid Neoplasms, AJCC 6th Edition - Clinical stage from 08/27/2016: Stage IIE (lymphoma only) - Signed by Lloyd Huger, MD on 08/27/2016   Jacquelin Hawking, NP   12/15/2017 3:07  PM

## 2017-12-16 ENCOUNTER — Other Ambulatory Visit: Payer: Self-pay | Admitting: *Deleted

## 2017-12-16 MED ORDER — HYDROCODONE-ACETAMINOPHEN 5-325 MG PO TABS: 2.0000 | ORAL_TABLET | Freq: Four times a day (QID) | ORAL | 0 refills | Status: DC | PRN

## 2017-12-17 ENCOUNTER — Other Ambulatory Visit: Payer: Self-pay | Admitting: *Deleted

## 2017-12-17 DIAGNOSIS — D6481 Anemia due to antineoplastic chemotherapy: Secondary | ICD-10-CM | POA: Insufficient documentation

## 2017-12-17 DIAGNOSIS — T451X5A Adverse effect of antineoplastic and immunosuppressive drugs, initial encounter: Secondary | ICD-10-CM

## 2017-12-17 MED ORDER — MORPHINE SULFATE ER 15 MG PO TBCR: 15.0000 mg | EXTENDED_RELEASE_TABLET | Freq: Two times a day (BID) | ORAL | 0 refills | Status: DC

## 2017-12-19 ENCOUNTER — Emergency Department: Payer: Medicare Other

## 2017-12-19 ENCOUNTER — Inpatient Hospital Stay
Admission: EM | Admit: 2017-12-19 | Discharge: 2017-12-27 | DRG: 871 | Disposition: A | Discharge status: Skilled Nursing Facility | Payer: Medicare Other | Attending: Family Medicine | Admitting: Family Medicine

## 2017-12-19 ENCOUNTER — Other Ambulatory Visit: Payer: Self-pay

## 2017-12-19 DIAGNOSIS — C8298 Follicular lymphoma, unspecified, lymph nodes of multiple sites: Secondary | ICD-10-CM | POA: Diagnosis present

## 2017-12-19 DIAGNOSIS — R000 Tachycardia, unspecified: Secondary | ICD-10-CM | POA: Diagnosis present

## 2017-12-19 DIAGNOSIS — A4151 Sepsis due to Escherichia coli [E. coli]: Secondary | ICD-10-CM | POA: Diagnosis not present

## 2017-12-19 DIAGNOSIS — R197 Diarrhea, unspecified: Secondary | ICD-10-CM | POA: Diagnosis present

## 2017-12-19 DIAGNOSIS — D61810 Antineoplastic chemotherapy induced pancytopenia: Secondary | ICD-10-CM | POA: Diagnosis present

## 2017-12-19 DIAGNOSIS — A419 Sepsis, unspecified organism: Secondary | ICD-10-CM | POA: Diagnosis not present

## 2017-12-19 DIAGNOSIS — C8208 Follicular lymphoma grade I, lymph nodes of multiple sites: Secondary | ICD-10-CM | POA: Diagnosis not present

## 2017-12-19 DIAGNOSIS — R54 Age-related physical debility: Secondary | ICD-10-CM | POA: Diagnosis present

## 2017-12-19 DIAGNOSIS — D649 Anemia, unspecified: Secondary | ICD-10-CM

## 2017-12-19 DIAGNOSIS — E785 Hyperlipidemia, unspecified: Secondary | ICD-10-CM | POA: Diagnosis present

## 2017-12-19 DIAGNOSIS — D709 Neutropenia, unspecified: Secondary | ICD-10-CM | POA: Diagnosis not present

## 2017-12-19 DIAGNOSIS — D696 Thrombocytopenia, unspecified: Secondary | ICD-10-CM

## 2017-12-19 DIAGNOSIS — I959 Hypotension, unspecified: Secondary | ICD-10-CM | POA: Diagnosis present

## 2017-12-19 DIAGNOSIS — Z808 Family history of malignant neoplasm of other organs or systems: Secondary | ICD-10-CM | POA: Diagnosis not present

## 2017-12-19 DIAGNOSIS — E43 Unspecified severe protein-calorie malnutrition: Secondary | ICD-10-CM

## 2017-12-19 DIAGNOSIS — D703 Neutropenia due to infection: Secondary | ICD-10-CM | POA: Diagnosis present

## 2017-12-19 DIAGNOSIS — E871 Hypo-osmolality and hyponatremia: Secondary | ICD-10-CM | POA: Diagnosis present

## 2017-12-19 DIAGNOSIS — R110 Nausea: Secondary | ICD-10-CM | POA: Diagnosis not present

## 2017-12-19 DIAGNOSIS — E876 Hypokalemia: Secondary | ICD-10-CM | POA: Diagnosis present

## 2017-12-19 DIAGNOSIS — Z803 Family history of malignant neoplasm of breast: Secondary | ICD-10-CM | POA: Diagnosis not present

## 2017-12-19 DIAGNOSIS — R7881 Bacteremia: Secondary | ICD-10-CM | POA: Diagnosis not present

## 2017-12-19 DIAGNOSIS — T451X5A Adverse effect of antineoplastic and immunosuppressive drugs, initial encounter: Secondary | ICD-10-CM | POA: Diagnosis present

## 2017-12-19 DIAGNOSIS — R5081 Fever presenting with conditions classified elsewhere: Secondary | ICD-10-CM | POA: Diagnosis not present

## 2017-12-19 DIAGNOSIS — Z801 Family history of malignant neoplasm of trachea, bronchus and lung: Secondary | ICD-10-CM | POA: Diagnosis not present

## 2017-12-19 DIAGNOSIS — Z833 Family history of diabetes mellitus: Secondary | ICD-10-CM | POA: Diagnosis not present

## 2017-12-19 DIAGNOSIS — Z96642 Presence of left artificial hip joint: Secondary | ICD-10-CM | POA: Diagnosis present

## 2017-12-19 DIAGNOSIS — K6289 Other specified diseases of anus and rectum: Secondary | ICD-10-CM | POA: Diagnosis present

## 2017-12-19 DIAGNOSIS — Z681 Body mass index (BMI) 19 or less, adult: Secondary | ICD-10-CM

## 2017-12-19 DIAGNOSIS — D61818 Other pancytopenia: Secondary | ICD-10-CM | POA: Diagnosis not present

## 2017-12-19 DIAGNOSIS — I499 Cardiac arrhythmia, unspecified: Secondary | ICD-10-CM | POA: Diagnosis not present

## 2017-12-19 DIAGNOSIS — D630 Anemia in neoplastic disease: Secondary | ICD-10-CM | POA: Diagnosis present

## 2017-12-19 DIAGNOSIS — E872 Acidosis: Secondary | ICD-10-CM | POA: Diagnosis present

## 2017-12-19 LAB — COMPREHENSIVE METABOLIC PANEL
ALT: 11 U/L — ABNORMAL LOW (ref 14–54)
ANION GAP: 12 (ref 5–15)
AST: 20 U/L (ref 15–41)
Albumin: 2.9 g/dL — ABNORMAL LOW (ref 3.5–5.0)
Alkaline Phosphatase: 34 U/L — ABNORMAL LOW (ref 38–126)
BILIRUBIN TOTAL: 0.8 mg/dL (ref 0.3–1.2)
BUN: 13 mg/dL (ref 6–20)
CALCIUM: 8.5 mg/dL — AB (ref 8.9–10.3)
CO2: 22 mmol/L (ref 22–32)
Chloride: 96 mmol/L — ABNORMAL LOW (ref 101–111)
Creatinine, Ser: 0.83 mg/dL (ref 0.44–1.00)
GFR calc Af Amer: >60 mL/min (ref >60)
Glucose, Bld: 118 mg/dL — ABNORMAL HIGH (ref 65–99)
POTASSIUM: 2.9 mmol/L — AB (ref 3.5–5.1)
Sodium: 130 mmol/L — ABNORMAL LOW (ref 135–145)
TOTAL PROTEIN: 5.7 g/dL — AB (ref 6.5–8.1)

## 2017-12-19 LAB — CBC WITH DIFFERENTIAL/PLATELET
BASOS PCT: 0 %
Basophils Absolute: 0 10*3/uL (ref 0–0.1)
EOS PCT: 10 %
Eosinophils Absolute: 0 10*3/uL (ref 0–0.7)
HEMATOCRIT: 20.7 % — AB (ref 35.0–47.0)
Hemoglobin: 7 g/dL — ABNORMAL LOW (ref 12.0–16.0)
LYMPHS PCT: 51 %
Lymphs Abs: 0 10*3/uL — ABNORMAL LOW (ref 1.0–3.6)
MCH: 34.5 pg — ABNORMAL HIGH (ref 26.0–34.0)
MCHC: 33.5 g/dL (ref 32.0–36.0)
MCV: 102.9 fL — ABNORMAL HIGH (ref 80.0–100.0)
Monocytes Absolute: 0 10*3/uL — ABNORMAL LOW (ref 0.2–0.9)
Monocytes Relative: 37 %
NEUTROS ABS: 0 10*3/uL — AB (ref 1.4–6.5)
Neutrophils Relative %: 2 %
Platelets: 6 10*3/uL — CL (ref 150–440)
RBC: 2.02 MIL/uL — AB (ref 3.80–5.20)
RDW: 15.8 % — AB (ref 11.5–14.5)
WBC: 0.1 10*3/uL — CL (ref 3.6–11.0)

## 2017-12-19 LAB — URINALYSIS, COMPLETE (UACMP) WITH MICROSCOPIC
Bilirubin Urine: NEGATIVE
GLUCOSE, UA: NEGATIVE mg/dL
HGB URINE DIPSTICK: NEGATIVE
Ketones, ur: NEGATIVE mg/dL
Leukocytes, UA: NEGATIVE
Nitrite: NEGATIVE
PH: 5 (ref 5.0–8.0)
PROTEIN: NEGATIVE mg/dL
RBC / HPF: NONE SEEN RBC/hpf (ref 0–5)
SPECIFIC GRAVITY, URINE: 1.006 (ref 1.005–1.030)

## 2017-12-19 LAB — PREPARE RBC (CROSSMATCH)

## 2017-12-19 LAB — ABO/RH: ABO/RH(D): O NEG

## 2017-12-19 LAB — LACTIC ACID, PLASMA
LACTIC ACID, VENOUS: 2.7 mmol/L — AB (ref 0.5–1.9)
Lactic Acid, Venous: 3.1 mmol/L (ref 0.5–1.9)

## 2017-12-19 LAB — INFLUENZA PANEL BY PCR (TYPE A & B)
Influenza A By PCR: NEGATIVE
Influenza B By PCR: NEGATIVE

## 2017-12-19 MED ORDER — SODIUM CHLORIDE 0.9 % IV SOLN
INTRAVENOUS | Status: DC
  Administered 2017-12-19 – 2017-12-21 (×3): via INTRAVENOUS

## 2017-12-19 MED ORDER — ACETAMINOPHEN 325 MG PO TABS: 650.0000 mg | ORAL_TABLET | Freq: Four times a day (QID) | ORAL | Status: DC | PRN

## 2017-12-19 MED ORDER — ONDANSETRON HCL 4 MG/2ML IJ SOLN
4.0000 mg | Freq: Four times a day (QID) | INTRAMUSCULAR | Status: DC | PRN
  Administered 2017-12-20 – 2017-12-25 (×6): 4 mg via INTRAVENOUS
  Filled 2017-12-19 (×6): qty 2

## 2017-12-19 MED ORDER — SODIUM CHLORIDE 0.9 % IV SOLN: 10.0000 mL/h | Freq: Once | INTRAVENOUS | Status: DC

## 2017-12-19 MED ORDER — ONDANSETRON HCL 4 MG PO TABS
4.0000 mg | ORAL_TABLET | Freq: Four times a day (QID) | ORAL | Status: DC | PRN
  Administered 2017-12-27: 4 mg via ORAL
  Filled 2017-12-19: qty 1

## 2017-12-19 MED ORDER — BISACODYL 5 MG PO TBEC: 5.0000 mg | DELAYED_RELEASE_TABLET | Freq: Every day | ORAL | Status: DC | PRN

## 2017-12-19 MED ORDER — ALBUTEROL SULFATE (2.5 MG/3ML) 0.083% IN NEBU: 2.5000 mg | INHALATION_SOLUTION | Freq: Four times a day (QID) | RESPIRATORY_TRACT | Status: DC | PRN

## 2017-12-19 MED ORDER — VANCOMYCIN HCL IN DEXTROSE 750-5 MG/150ML-% IV SOLN
750.0000 mg | INTRAVENOUS | Status: DC
  Administered 2017-12-20: 750 mg via INTRAVENOUS
  Filled 2017-12-19 (×2): qty 150

## 2017-12-19 MED ORDER — DOCUSATE SODIUM 100 MG PO CAPS
100.0000 mg | ORAL_CAPSULE | Freq: Two times a day (BID) | ORAL | Status: DC
  Administered 2017-12-20: 100 mg via ORAL
  Filled 2017-12-19 (×2): qty 1

## 2017-12-19 MED ORDER — ACETAMINOPHEN 325 MG PO TABS
ORAL_TABLET | ORAL | Status: AC
  Administered 2017-12-19: 325 mg via ORAL
  Filled 2017-12-19: qty 1

## 2017-12-19 MED ORDER — HYDROCODONE-ACETAMINOPHEN 5-325 MG PO TABS
1.0000 | ORAL_TABLET | ORAL | Status: DC | PRN
  Administered 2017-12-19 – 2017-12-25 (×4): 1 via ORAL
  Filled 2017-12-19 (×4): qty 1

## 2017-12-19 MED ORDER — MEGESTROL ACETATE 40 MG PO TABS
40.0000 mg | ORAL_TABLET | Freq: Every day | ORAL | Status: DC
  Administered 2017-12-20: 40 mg via ORAL
  Filled 2017-12-19: qty 1

## 2017-12-19 MED ORDER — PANTOPRAZOLE SODIUM 40 MG PO PACK
20.0000 mg | PACK | Freq: Every day | ORAL | Status: DC
  Administered 2017-12-20: 20 mg via ORAL
  Filled 2017-12-19: qty 20

## 2017-12-19 MED ORDER — PROCHLORPERAZINE MALEATE 10 MG PO TABS
10.0000 mg | ORAL_TABLET | Freq: Four times a day (QID) | ORAL | Status: DC | PRN
  Filled 2017-12-19: qty 1

## 2017-12-19 MED ORDER — CENTRUM SILVER PO TABS: ORAL_TABLET | Freq: Every day | ORAL | Status: DC

## 2017-12-19 MED ORDER — ACETAMINOPHEN 650 MG RE SUPP: 650.0000 mg | Freq: Four times a day (QID) | RECTAL | Status: DC | PRN

## 2017-12-19 MED ORDER — SODIUM CHLORIDE 0.9 % IV BOLUS (SEPSIS)
1000.0000 mL | Freq: Once | INTRAVENOUS | Status: AC
  Administered 2017-12-19: 1000 mL via INTRAVENOUS

## 2017-12-19 MED ORDER — ACETAMINOPHEN 325 MG PO TABS
325.0000 mg | ORAL_TABLET | Freq: Once | ORAL | Status: AC
  Administered 2017-12-19: 325 mg via ORAL

## 2017-12-19 MED ORDER — POTASSIUM CHLORIDE 20 MEQ PO PACK
60.0000 meq | PACK | Freq: Once | ORAL | Status: AC
  Administered 2017-12-20: 60 meq via ORAL
  Filled 2017-12-19: qty 3

## 2017-12-19 MED ORDER — VANCOMYCIN HCL IN DEXTROSE 1-5 GM/200ML-% IV SOLN
1000.0000 mg | Freq: Once | INTRAVENOUS | Status: AC
  Administered 2017-12-19: 1000 mg via INTRAVENOUS
  Filled 2017-12-19: qty 200

## 2017-12-19 MED ORDER — SIMVASTATIN 20 MG PO TABS
20.0000 mg | ORAL_TABLET | Freq: Every day | ORAL | Status: DC
  Administered 2017-12-19 – 2017-12-26 (×7): 20 mg via ORAL
  Filled 2017-12-19: qty 2
  Filled 2017-12-19 (×2): qty 1
  Filled 2017-12-19: qty 2
  Filled 2017-12-19 (×2): qty 1
  Filled 2017-12-19 (×2): qty 2

## 2017-12-19 MED ORDER — PIPERACILLIN-TAZOBACTAM 3.375 G IVPB: 3.3750 g | Freq: Three times a day (TID) | INTRAVENOUS | Status: DC

## 2017-12-19 MED ORDER — TRAZODONE HCL 50 MG PO TABS
25.0000 mg | ORAL_TABLET | Freq: Every evening | ORAL | Status: DC | PRN
  Administered 2017-12-21 – 2017-12-25 (×3): 25 mg via ORAL
  Filled 2017-12-19 (×3): qty 1

## 2017-12-19 MED ORDER — SODIUM CHLORIDE 0.9 % IV SOLN: 2.0000 g | Freq: Three times a day (TID) | INTRAVENOUS | Status: DC

## 2017-12-19 MED ORDER — ADULT MULTIVITAMIN W/MINERALS CH
1.0000 | ORAL_TABLET | Freq: Every day | ORAL | Status: DC
  Administered 2017-12-21 – 2017-12-27 (×7): 1 via ORAL
  Filled 2017-12-19 (×8): qty 1

## 2017-12-19 MED ORDER — PENTAFLUOROPROP-TETRAFLUOROETH EX AERO
INHALATION_SPRAY | CUTANEOUS | Status: AC
  Filled 2017-12-19: qty 30

## 2017-12-19 MED ORDER — POTASSIUM CHLORIDE 10 MEQ/100ML IV SOLN
10.0000 meq | INTRAVENOUS | Status: AC
  Administered 2017-12-20 (×2): 10 meq via INTRAVENOUS
  Filled 2017-12-19 (×2): qty 100

## 2017-12-19 MED ORDER — PIPERACILLIN-TAZOBACTAM 3.375 G IVPB 30 MIN
3.3750 g | Freq: Once | INTRAVENOUS | Status: AC
  Administered 2017-12-19: 3.375 g via INTRAVENOUS
  Filled 2017-12-19: qty 50

## 2017-12-19 MED ORDER — SODIUM CHLORIDE 0.9 % IV BOLUS (SEPSIS)
500.0000 mL | Freq: Once | INTRAVENOUS | Status: AC
  Administered 2017-12-19: 500 mL via INTRAVENOUS

## 2017-12-19 NOTE — ED Triage Notes (Signed)
Pt is a cancer pt and has a fever - the cancer provider advised them to come to the ER - the fever max was 102 (nothing was given for temp) - pt c/o lower back pain - denies any other symptoms

## 2017-12-19 NOTE — ED Provider Notes (Signed)
[facility] Emergency Department Provider Note  ____________________________________________   First MD Initiated Contact with Patient 12/19/17 1908     (approximate)  I have reviewed the triage vital signs and the nursing notes.   HISTORY  Chief Complaint Fever   HPI Veronica Boyd is a 72 y.o. 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  Patient says that she had a fever of 102 earlier in the day but then took 2 hydrocodone-containing Tylenol pills about an hour and a half ago.  She says that her fever was accompanied by abdominal cramping as well as back pain.  However, the pain has now gone away after the hydrocodone.  She denies any cough.  Denies any known sick contacts.  Last chemotherapy 1 week ago.  She is seen in the cancer center by Dr. Grayland Ormond.  Denies any vomiting or diarrhea.   Past Medical History:  Diagnosis Date  . Arthritis   . Cataract    bilateral  . Chicken pox   . Colon polyp   . Follicular lymphoma ([place]) 08/2016   lymph nodes   . Hyperlipidemia   . Osteoporosis     Patient Active Problem List   Diagnosis Date Noted  . Anemia due to antineoplastic chemotherapy 12/17/2017  . Goals of care, counseling/discussion 09/19/2017  . Pure hypercholesterolemia 09/10/2016  . 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 Follicular lymphoma ([place]) 07/24/2016  . Mass of chest wall, right 06/06/2016  . Primary osteoarthritis involving multiple joints 12/14/2015  . Transient insomnia 08/27/2015  . Mixed hyperlipidemia 03/01/2015  . Nocturnal muscle cramps 03/01/2015  . Osteoporosis 11/08/2014  . Primary osteoarthritis of both knees 07/05/2014  . Hyperlipidemia, unspecified 03/19/2014  . Osteoarthritis of knee 03/19/2014  . Abnormal mammogram 03/03/2013  . Vitamin D deficiency 05/09/2012    Past Surgical History:  Procedure Laterality Date  . AXILLARY LYMPH NODE  DISSECTION Right 08/21/2016   Procedure: AXILLARY LYMPH NODE excision;  Surgeon: Leonie Green, MD;  Location: [facility] ORS;  Service: General;  Laterality: Right;  . CATARACT EXTRACTION, BILATERAL Bilateral   . JOINT REPLACEMENT Left   . PORTA CATH INSERTION N/A 09/16/2017   Procedure: PORTA CATH INSERTION;  Surgeon: Algernon Huxley, MD;  Location: [place] [facility];  Service: Cardiovascular;  Laterality: N/A;  . TONSILLECTOMY    . TOTAL HIP ARTHROPLASTY Left 1992    Prior to Admission medications   Medication Sig Start Date End Date Taking? Authorizing Provider  albuterol (PROVENTIL HFA;VENTOLIN HFA) 108 (90 Base) MCG/ACT inhaler Inhale 2 puffs into the lungs every 6 (six) hours as needed for wheezing or shortness of breath. 11/18/17  Yes Verlon Au, NP  HYDROcodone-acetaminophen (NORCO/VICODIN) 5-325 MG tablet Take 2 tablets by mouth every 6 (six) hours as needed for moderate pain. 12/16/17  Yes Lloyd Huger, MD  lidocaine-prilocaine (EMLA) cream Apply 1 application as needed topically. 09/13/17  Yes Jacquelin Hawking, NP  loperamide (IMODIUM) 2 MG capsule Take 2 mg by mouth as needed for diarrhea or loose stools.   Yes [provider]  megestrol (MEGACE) 40 MG tablet Take 1 tablet (40 mg total) by mouth daily. 10/10/17  Yes Lloyd Huger, MD  ondansetron (ZOFRAN) 8 MG tablet Take 1 tablet (8 mg total) 2 (two) times daily as needed by mouth for refractory nausea / vomiting. 09/19/17  Yes Lloyd Huger, MD  pantoprazole (PROTONIX) 20 MG tablet Take 20 mg  by mouth at bedtime.    Yes [provider]  prochlorperazine (COMPAZINE) 10 MG tablet Take 1 tablet (10 mg total) every 6 (six) hours as needed by mouth (Nausea or vomiting). 09/19/17  Yes Lloyd Huger, MD  simvastatin (ZOCOR) 20 MG tablet Take 20 mg by mouth at bedtime.    Yes [provider]  bacitracin-polymyxin b (POLYSPORIN) ophthalmic ointment Place 1 application into the left eye  at bedtime. apply to left eye lash line after washing at bedtime Patient not taking: Reported on 12/19/2017 11/18/17   Verlon Au, NP  chlorpheniramine-HYDROcodone (TUSSIONEX) 10-8 MG/5ML SUER Take 5 mLs by mouth every 12 (twelve) hours as needed for cough. Patient not taking: Reported on 12/19/2017 08/07/17   Lloyd Huger, MD  diphenoxylate-atropine (LOMOTIL) 2.5-0.025 MG tablet Take 1 tablet by mouth 4 (four) times daily as needed for diarrhea or loose stools. Patient not taking: Reported on 12/19/2017 09/30/17   Jacquelin Hawking, NP  morphine (MS CONTIN) 15 MG 12 hr tablet Take 1 tablet (15 mg total) by mouth every 12 (twelve) hours. Patient not taking: Reported on 12/19/2017 12/17/17   Lloyd Huger, MD  Multiple Vitamins-Minerals (CENTRUM SILVER PO) Take 1 tablet by mouth daily.    [provider]  predniSONE (DELTASONE) 20 MG tablet Take 5 tablets by mouth daily for 5 days after chemotherapy Patient not taking: Reported on 12/19/2017 11/20/17   Lloyd Huger, MD    Allergies Patient has no known allergies.  Family History  Problem Relation Age of Onset  . Diabetes Sister   . Lung cancer Brother   . Diabetes Brother   . Basal cell carcinoma Daughter   . Breast cancer Paternal Aunt     Social History Social History   Tobacco Use  . Smoking status: Never Smoker  . Smokeless tobacco: Never Used  Substance Use Topics  . Alcohol use: No  . Drug use: No    Review of Systems  Constitutional: fever Eyes: No visual changes. ENT: No sore throat. Cardiovascular: Denies chest pain. Respiratory: Denies shortness of breath. Gastrointestinal:  No nausea, no vomiting.  No diarrhea.  No constipation. Genitourinary: Negative for dysuria. Musculoskeletal:  Skin: Negative for rash. Neurological: Negative for headaches, focal weakness or numbness.   ____________________________________________   PHYSICAL EXAM:  VITAL SIGNS: ED Triage Vitals  Enc  Vitals Group     BP 12/19/17 1842 (!) 66/41     Pulse Rate 12/19/17 1842 (!) 121     Resp 12/19/17 1842 15     Temp 12/19/17 1842 98.5 F (36.9 C)     Temp Source 12/19/17 1842 Oral     SpO2 12/19/17 1842 99 %     Weight 12/19/17 1839 94 lb (42.6 kg)     Height 12/19/17 1839 4\' 11"  (1.499 m)     Head Circumference --      Peak Flow --      Pain Score 12/19/17 1839 4     Pain Loc --      Pain Edu? --      Excl. in [place]? --     Constitutional: Alert and oriented. Well appearing and in no acute distress. Eyes: Conjunctivae are normal.  Head: Atraumatic. Nose: No congestion/rhinnorhea. Mouth/Throat: Mucous membranes are moist.  Neck: No stridor.   Cardiovascular: Tachycardic, regular rhythm. Grossly normal heart sounds.   Respiratory: Normal respiratory effort.  No retractions. Lungs CTAB. Gastrointestinal: Soft and nontender. No distention. No CVA tenderness. Musculoskeletal:  No lower extremity tenderness nor edema.  No joint effusions. Neurologic:  Normal speech and language. No gross focal neurologic deficits are appreciated. Skin:  Skin is warm, dry and intact. No rash noted. Psychiatric: Mood and affect are normal. Speech and behavior are normal.  ____________________________________________   LABS (all labs ordered are listed, but only abnormal results are displayed)  Labs Reviewed  COMPREHENSIVE METABOLIC PANEL - Abnormal; Notable for the following components:      Result Value   Sodium 130 (*)    Potassium 2.9 (*)    Chloride 96 (*)    Glucose, Bld 118 (*)    Calcium 8.5 (*)    Total Protein 5.7 (*)    Albumin 2.9 (*)    ALT 11 (*)    Alkaline Phosphatase 34 (*)    All other components within normal limits  CULTURE, BLOOD (ROUTINE X 2)  CULTURE, BLOOD (ROUTINE X 2)  URINE CULTURE  CBC WITH DIFFERENTIAL/PLATELET  LACTIC ACID, PLASMA  LACTIC ACID, PLASMA  URINALYSIS, COMPLETE (UACMP) WITH MICROSCOPIC  INFLUENZA PANEL BY PCR (TYPE A & B)    ____________________________________________  EKG  ED ECG REPORT I, Doran Stabler, the attending physician, personally viewed and interpreted this ECG.   Date: 12/19/2017  EKG Time: 1900  Rate: 109  Rhythm: sinus tachycardia  Axis: Normal  Intervals:none  ST&T Change: No ST segment elevation or depression.  No abnormal T wave inversion.  ____________________________________________  RADIOLOGY  No acute finding on the chest x-ray ____________________________________________   PROCEDURES  Procedure(s) performed:   .Critical Care Performed by: Orbie Pyo, MD Authorized by: Orbie Pyo, MD   Critical care provider statement:    Critical care time (minutes):  35   Critical care was necessary to treat or prevent imminent or life-threatening deterioration of the following conditions:  Sepsis   Critical care was time spent personally by me on the following activities:  Ordering and performing treatments and interventions, ordering and review of laboratory studies, ordering and review of radiographic studies and re-evaluation of patient's condition    Critical Care performed:   ____________________________________________   INITIAL IMPRESSION / ASSESSMENT AND PLAN / ED COURSE  Pertinent labs & imaging results that were available during my care of the patient were reviewed by me and considered in my medical decision making (see chart for details).  DDX: Influenza, bacteremia, UTI, pyelonephritis, Differential diagnosis includes, but is not limited to, ovarian cyst, ovarian torsion, acute appendicitis, diverticulitis, urinary tract infection/pyelonephritis, endometriosis, bowel obstruction, colitis, renal colic, gastroenteritis, hernia, fibroids, endometriosis, pregnancy related pain including ectopic pregnancy, etc. As part of my medical decision making, I reviewed the following data within the [facility] chart  reviewed  Sepsis alert called and broad-spectrum antibiotics ordered.  Patient's port currently being accessed.  ----------------------------------------- 8:21 PM on 12/19/2017 -----------------------------------------  Patient at this time with improvement of blood pressure to 84/32.  Heart rate of 91 at this time.  Pending blood work as well as urinalysis and flu swab.  Patient signed out to Dr. Kerman Passey.  Likely need for admission.     ____________________________________________   FINAL CLINICAL IMPRESSION(S) / ED DIAGNOSES  Sepsis.    NEW MEDICATIONS STARTED DURING THIS VISIT:  New Prescriptions   No medications on file     Note:  This document was prepared using Dragon voice recognition software and may include unintentional dictation errors.     Orbie Pyo, MD 12/19/17 2022

## 2017-12-19 NOTE — ED Notes (Signed)
Patient given meal at this time

## 2017-12-19 NOTE — Consult Note (Signed)
PULMONARY / CRITICAL CARE MEDICINE   Name: Veronica Boyd MRN: [id number] DOB: Mar 25, 1946    ADMISSION DATE:  12/19/2017   CONSULTATION DATE:  12/19/2017  REFERRING MD:  Dr Duane Boston  REASON:  Neutropenic sepsis  HISTORY OF PRESENT ILLNESS:   This is a 72 y/o female with a h/o Progressive follicular lymphoma currently undergoing chemotherapy, who presented to the ED with fever of 102 F.  She also complained of abdominal pain and back pain.  He takes hydrocodone at home and reports very minimal relief with home dose.  Her last chemo session was 1 week ago.  She denies any other symptoms. Her ED workup showed severe hypokalemia, mild hyponatremia, lactic acidosis, severe neutropenia with a WBC of 0.1, hemoglobin of 7.0, hematocrit of 20.7, and platelets of 6000.  She is being admitted to the ICU for further management.  PAST MEDICAL HISTORY :  She  has a past medical history of Arthritis, Cataract, Chicken pox, Colon polyp, Follicular lymphoma ([place]) (08/2016), Hyperlipidemia, and Osteoporosis.  PAST SURGICAL HISTORY: She  has a past surgical history that includes Cataract extraction, bilateral (Bilateral); Tonsillectomy; Joint replacement (Left); Total hip arthroplasty (Left, 1992); Axillary lymph node dissection (Right, 08/21/2016); and PORTA CATH INSERTION (N/A, 09/16/2017).  No Known Allergies  Current Facility-Administered Medications on File Prior to Encounter  Medication  . heparin lock flush 100 unit/mL  . sodium chloride flush (NS) 0.9 % injection 10 mL  . yttrium-90 injection 74.2 millicurie   Current Outpatient Medications on File Prior to Encounter  Medication Sig  . albuterol (PROVENTIL HFA;VENTOLIN HFA) 108 (90 Base) MCG/ACT inhaler Inhale 2 puffs into the lungs every 6 (six) hours as needed for wheezing or shortness of breath.  Marland Kitchen HYDROcodone-acetaminophen (NORCO/VICODIN) 5-325 MG tablet Take 2 tablets by mouth every 6 (six) hours as needed for moderate pain.  Marland Kitchen  lidocaine-prilocaine (EMLA) cream Apply 1 application as needed topically.  Marland Kitchen loperamide (IMODIUM) 2 MG capsule Take 2 mg by mouth as needed for diarrhea or loose stools.  . megestrol (MEGACE) 40 MG tablet Take 1 tablet (40 mg total) by mouth daily.  . ondansetron (ZOFRAN) 8 MG tablet Take 1 tablet (8 mg total) 2 (two) times daily as needed by mouth for refractory nausea / vomiting.  . pantoprazole (PROTONIX) 20 MG tablet Take 20 mg by mouth at bedtime.   . prochlorperazine (COMPAZINE) 10 MG tablet Take 1 tablet (10 mg total) every 6 (six) hours as needed by mouth (Nausea or vomiting).  . simvastatin (ZOCOR) 20 MG tablet Take 20 mg by mouth at bedtime.   . bacitracin-polymyxin b (POLYSPORIN) ophthalmic ointment Place 1 application into the left eye at bedtime. apply to left eye lash line after washing at bedtime (Patient not taking: Reported on 12/19/2017)  . chlorpheniramine-HYDROcodone (TUSSIONEX) 10-8 MG/5ML SUER Take 5 mLs by mouth every 12 (twelve) hours as needed for cough. (Patient not taking: Reported on 12/19/2017)  . diphenoxylate-atropine (LOMOTIL) 2.5-0.025 MG tablet Take 1 tablet by mouth 4 (four) times daily as needed for diarrhea or loose stools. (Patient not taking: Reported on 12/19/2017)  . morphine (MS CONTIN) 15 MG 12 hr tablet Take 1 tablet (15 mg total) by mouth every 12 (twelve) hours. (Patient not taking: Reported on 12/19/2017)  . Multiple Vitamins-Minerals (CENTRUM SILVER PO) Take 1 tablet by mouth daily.  . predniSONE (DELTASONE) 20 MG tablet Take 5 tablets by mouth daily for 5 days after chemotherapy (Patient not taking: Reported on 12/19/2017)    FAMILY HISTORY:  Her indicated that the status of her sister is unknown. She indicated that the status of her daughter is unknown. She indicated that the status of her paternal aunt is unknown.   SOCIAL HISTORY: She  reports that  has never smoked. she has never used smokeless tobacco. She reports that she does not drink alcohol  or use drugs.  REVIEW OF SYSTEMS:   Constitutional: Positive for fever, chills and generalized weakness  HEENT: Negative for congestion and rhinorrhea.  Eyes: Negative for redness and visual disturbance.  Respiratory: Negative for shortness of breath and wheezing.  Cardiovascular: Negative for chest pain and palpitations.  Gastrointestinal: Positive for occasional nausea  and abdominal pain and but negative for diarrhea and vomiting  Genitourinary: Negative for dysuria and urgency.  Endocrine: Denies polyuria, polyphagia and heat intolerance Musculoskeletal: Positive for unsteady gait but negative for recent falls Skin: Negative for pallor and wound.  Neurological: Negative for dizziness and headaches   SUBJECTIVE:   VITAL SIGNS: BP (!) 99/57   Pulse (!) 114   Temp 98.5 F (36.9 C) (Oral)   Resp (!) 25   Ht 4\' 11"  (1.499 m)   Wt 94 lb (42.6 kg)   SpO2 97%   BMI 18.99 kg/m   HEMODYNAMICS:    VENTILATOR SETTINGS:    INTAKE / OUTPUT: No intake/output data recorded.  PHYSICAL EXAMINATION: General: Acutely ill looking, pale Neuro: Alert and oriented x4, cranial nerves intact HEENT: PERRLA, no JVD Cardiovascular: Apical pulse regular, S1-S2, no murmur regurg or gallop, +2 pulses, no edema Lungs: Clear to auscultation bilaterally, mildly diminished in the bases Abdomen: Nondistended, normal bowel sounds in all 4 quadrants, palpation reveals no organomegaly Musculoskeletal: Positive range of motion in upper and lower extremities Skin: Warm and dry  LABS:  BMET Recent Labs  Lab 12/19/17 1910  NA 130*  K 2.9*  CL 96*  CO2 22  BUN 13  CREATININE 0.83  GLUCOSE 118*    Electrolytes Recent Labs  Lab 12/19/17 1910  CALCIUM 8.5*    CBC Recent Labs  Lab 12/19/17 1910  WBC 0.1*  HGB 7.0*  HCT 20.7*  PLT 6*    Coag's No results for input(s): APTT, INR in the last 168 hours.  Sepsis Markers Recent Labs  Lab 12/19/17 1910 12/19/17 2204   LATICACIDVEN 2.7* 3.1*    ABG No results for input(s): PHART, PCO2ART, PO2ART in the last 168 hours.  Liver Enzymes Recent Labs  Lab 12/19/17 1910  AST 20  ALT 11*  ALKPHOS 34*  BILITOT 0.8  ALBUMIN 2.9*    Cardiac Enzymes No results for input(s): TROPONINI, PROBNP in the last 168 hours.  Glucose No results for input(s): GLUCAP in the last 168 hours.  Imaging Dg Chest Port 1 View  Result Date: 12/19/2017 CLINICAL DATA:  Fever.  History of lymphoma. EXAM: PORTABLE CHEST 1 VIEW COMPARISON:  Chest CT 12/09/2017 and chest x-ray 11/18/2017 FINDINGS: The right IJ Port-A-Cath is stable. The heart is normal in size. Stable tortuosity and calcification of the thoracic aorta. The lungs are clear. No pulmonary lesions or pleural effusion. Stable hazy right upper lobe scarring type changes. The bony thorax is intact. IMPRESSION: No acute pulmonary findings. Stable right upper lobe scarring type changes. Electronically Signed   By: Marijo Sanes M.D.   On: 12/19/2017 19:31    STUDIES:  None  CULTURES: Cultures x2 Urine culture ANTIBIOTICS: Vancomycin Zosyn  SIGNIFICANT EVENTS: 02/14: Admitted  LINES/TUBES: Right chest wall MediPort Peripheral IVs  DISCUSSION:  72 year old female with active follicular lymphoma, admitted with neutropenic sepsis, anemia of chronic disease and severe thrombocytopenia  ASSESSMENT Neutropenic sepsis Acute anemia secondary to malignancy Severe thrombocytopenia Severe hypokalemia, hypophosphatemia and hypomagnesemia Progressive follicular lymphoma-currently on chemotherapy History of hyperlipidemia   PLAN Hemodynamics per ICU protocol Oncology consulted Transfuse platelets and packed red blood cells Trend CBC and transfuse blood products as needed Neutropenic precautions IV fluids Monitor and correct electrolytes Monitor intake and output Treat fever with Tylenol as needed Follow-up cultures GI and DVT prophylaxis-no pharmacologic DVT  prophylaxis secondary to severe thrombocytopenia  FAMILY  - Updates: Patient and family updated at bedside.  All questions answered  - Inter-disciplinary family meet or Palliative Care meeting due by:  day [place]. [facility] ANP-BC Pulmonary and Critical Care Medicine [facility] Pager [phone] or [phone]  NB: This document was prepared using Dragon voice recognition software and may include unintentional dictation errors.    12/19/2017, 11:53 PM

## 2017-12-19 NOTE — Progress Notes (Signed)
Pharmacy Antibiotic Note  Veronica Boyd is a 72 y.o. female admitted on 12/19/2017 with sepsis.  Pharmacy has been consulted for vanc/zosyn dosing.  Plan: Patient received vanc 1g and zosyn 3.375g IV x 1  Will continue w/ vanc 750 mg IV q24h Will check a vanc trough 02/18 @ 1900 prior to 4th dose. Will continue zosyn 3.375g IV q8h   Ke 0.0376 T1/2 18 ~ 24 hrs for ease of admin Goal trough 15 - 20 mcg/mL  Height: 4\' 11"  (149.9 cm) Weight: 94 lb (42.6 kg) IBW/kg (Calculated) : 43.2  Temp (24hrs), Avg:98.5 F (36.9 C), Min:98.5 F (36.9 C), Max:98.5 F (36.9 C)  Recent Labs  Lab 12/19/17 1910  WBC 0.1*  CREATININE 0.83  LATICACIDVEN 2.7*    Estimated Creatinine Clearance: 40 mL/min (by C-G formula based on SCr of 0.83 mg/dL).    No Known Allergies   Thank you for allowing pharmacy to be a part of this patient's care.  Tobie Lords, PharmD, BCPS Clinical Pharmacist 12/19/2017

## 2017-12-19 NOTE — H&P (Signed)
[facility] [place] NAME: Veronica Boyd    MR#:  [id number]  DATE OF BIRTH:  04-08-1946  DATE OF ADMISSION:  12/19/2017  PRIMARY CARE PHYSICIAN: Glendon Axe, MD   REQUESTING/REFERRING PHYSICIAN:   CHIEF COMPLAINT:   Chief Complaint  Patient presents with  . Fever    HISTORY OF PRESENT ILLNESS: Veronica Boyd  is a 72 y.o. female with a known history of recurrent follicular lymphoma, currently undergoing chemotherapy, status post fifth CHOP cycle 1 week ago.  Patient was brought to emergency room for fever, as high as 102 and generalized weakness and fatigue going on for the past 24 hours.  Symptoms are severe and improved very little with Vicodin at home.  Per family, who was present at bedside, the patient has been noted with very poor appetite since on chemotherapy.  Her p.o. intake has been particularly poor in the past 24 hours.  She denied, however, any new chest pain or shortness of breath; no nausea, vomiting, diarrhea; no cough; no bleeding. Blood test results from emergency room showed pancytopenia with WBC at 0.1, absolute neutrophil count at 0; platelet count 6.  Hemoglobin level is 7.  Potassium level is low at 2.9.  Lactic acid level is elevated at 2.7. patient is noted with hypotension and tachycardia.  Chest x-ray and UA are unremarkable. Patient is admitted for further evaluation and treatment.   PAST MEDICAL HISTORY:   Past Medical History:  Diagnosis Date  . Arthritis   . Cataract    bilateral  . Chicken pox   . Colon polyp   . Follicular lymphoma ([place]) 08/2016   lymph nodes   . Hyperlipidemia   . Osteoporosis     PAST SURGICAL HISTORY:  Past Surgical History:  Procedure Laterality Date  . AXILLARY LYMPH NODE DISSECTION Right 08/21/2016   Procedure: AXILLARY LYMPH NODE excision;  Surgeon: Leonie Green, MD;  Location: [facility];  Service: General;  Laterality: Right;  . CATARACT EXTRACTION,  BILATERAL Bilateral   . JOINT REPLACEMENT Left   . PORTA CATH INSERTION N/A 09/16/2017   Procedure: PORTA CATH INSERTION;  Surgeon: Algernon Huxley, MD;  Location: [facility];  Service: Cardiovascular;  Laterality: N/A;  . TONSILLECTOMY    . TOTAL HIP ARTHROPLASTY Left 1992    SOCIAL HISTORY:  Social History   Tobacco Use  . Smoking status: Never Smoker  . Smokeless tobacco: Never Used  Substance Use Topics  . Alcohol use: No    FAMILY HISTORY:  Family History  Problem Relation Age of Onset  . Diabetes Sister   . Lung cancer Brother   . Diabetes Brother   . Basal cell carcinoma Daughter   . Breast cancer Paternal Aunt     DRUG ALLERGIES: No Known Allergies  REVIEW OF SYSTEMS:   CONSTITUTIONAL: Positive for fever, fatigue and generalized weakness.  EYES: No blurred or double vision.  EARS, NOSE, AND THROAT: No tinnitus or ear pain.  RESPIRATORY: No cough, shortness of breath, wheezing or hemoptysis.  CARDIOVASCULAR: No chest pain, orthopnea, edema.  GASTROINTESTINAL: No nausea, vomiting, diarrhea or abdominal pain.  GENITOURINARY: No dysuria, hematuria.  ENDOCRINE: No polyuria, nocturia,  HEMATOLOGY: No bleeding SKIN: No rash or lesion. MUSCULOSKELETAL: Positive for body aches, especially back pain.   NEUROLOGIC: No focal weakness.  PSYCHIATRY: No anxiety or depression.   MEDICATIONS AT HOME:  Prior to Admission medications   Medication Sig Start Date End Date Taking?  Authorizing Provider  albuterol (PROVENTIL HFA;VENTOLIN HFA) 108 (90 Base) MCG/ACT inhaler Inhale 2 puffs into the lungs every 6 (six) hours as needed for wheezing or shortness of breath. 11/18/17  Yes Verlon Au, NP  HYDROcodone-acetaminophen (NORCO/VICODIN) 5-325 MG tablet Take 2 tablets by mouth every 6 (six) hours as needed for moderate pain. 12/16/17  Yes Lloyd Huger, MD  lidocaine-prilocaine (EMLA) cream Apply 1 application as needed topically. 09/13/17  Yes Jacquelin Hawking, NP   loperamide (IMODIUM) 2 MG capsule Take 2 mg by mouth as needed for diarrhea or loose stools.   Yes [provider]  megestrol (MEGACE) 40 MG tablet Take 1 tablet (40 mg total) by mouth daily. 10/10/17  Yes Lloyd Huger, MD  ondansetron (ZOFRAN) 8 MG tablet Take 1 tablet (8 mg total) 2 (two) times daily as needed by mouth for refractory nausea / vomiting. 09/19/17  Yes Lloyd Huger, MD  pantoprazole (PROTONIX) 20 MG tablet Take 20 mg by mouth at bedtime.    Yes [provider]  prochlorperazine (COMPAZINE) 10 MG tablet Take 1 tablet (10 mg total) every 6 (six) hours as needed by mouth (Nausea or vomiting). 09/19/17  Yes Lloyd Huger, MD  simvastatin (ZOCOR) 20 MG tablet Take 20 mg by mouth at bedtime.    Yes [provider]  bacitracin-polymyxin b (POLYSPORIN) ophthalmic ointment Place 1 application into the left eye at bedtime. apply to left eye lash line after washing at bedtime Patient not taking: Reported on 12/19/2017 11/18/17   Verlon Au, NP  chlorpheniramine-HYDROcodone (TUSSIONEX) 10-8 MG/5ML SUER Take 5 mLs by mouth every 12 (twelve) hours as needed for cough. Patient not taking: Reported on 12/19/2017 08/07/17   Lloyd Huger, MD  diphenoxylate-atropine (LOMOTIL) 2.5-0.025 MG tablet Take 1 tablet by mouth 4 (four) times daily as needed for diarrhea or loose stools. Patient not taking: Reported on 12/19/2017 09/30/17   Jacquelin Hawking, NP  morphine (MS CONTIN) 15 MG 12 hr tablet Take 1 tablet (15 mg total) by mouth every 12 (twelve) hours. Patient not taking: Reported on 12/19/2017 12/17/17   Lloyd Huger, MD  Multiple Vitamins-Minerals (CENTRUM SILVER PO) Take 1 tablet by mouth daily.    [provider]  predniSONE (DELTASONE) 20 MG tablet Take 5 tablets by mouth daily for 5 days after chemotherapy Patient not taking: Reported on 12/19/2017 11/20/17   Lloyd Huger, MD      PHYSICAL EXAMINATION:   VITAL SIGNS:  Blood pressure (!) 99/57, pulse (!) 114, temperature 98.5 F (36.9 C), temperature source Oral, resp. rate (!) 25, height 4\' 11"  (1.499 m), weight 42.6 kg (94 lb), SpO2 97 %.  GENERAL:  72 y.o.-year-old patient lying in the bed.  She looks very thin and pale, acutely ill, but not toxic. EYES: Pupils equal, round, reactive to light and accommodation. No scleral icterus. Extraocular muscles intact.  HEENT: Head atraumatic, normocephalic. Oropharynx and nasopharynx clear.  NECK:  Supple, no jugular venous distention. No thyroid enlargement, no tenderness.  LUNGS: Reduced breath sounds bilaterally, no wheezing. No use of accessory muscles of respiration.  CARDIOVASCULAR: S1, S2 normal. No murmurs, rubs, or gallops.  ABDOMEN: Soft, nontender, nondistended. Bowel sounds present.  EXTREMITIES: No pedal edema.  NEUROLOGIC: No focal weakness. Gait not checked.  PSYCHIATRIC: The patient is alert and oriented x 3.  SKIN: No obvious rash, lesion, or ulcer.   LABORATORY PANEL:   CBC Recent Labs  Lab 12/19/17 1910  WBC 0.1*  HGB 7.0*  HCT 20.7*  PLT 6*  MCV 102.9*  MCH 34.5*  MCHC 33.5  RDW 15.8*  LYMPHSABS 0.0*  MONOABS 0.0*  EOSABS 0.0  BASOSABS 0.0   ------------------------------------------------------------------------------------------------------------------  Chemistries  Recent Labs  Lab 12/19/17 1910  NA 130*  K 2.9*  CL 96*  CO2 22  GLUCOSE 118*  BUN 13  CREATININE 0.83  CALCIUM 8.5*  AST 20  ALT 11*  ALKPHOS 34*  BILITOT 0.8   ------------------------------------------------------------------------------------------------------------------ estimated creatinine clearance is 40 mL/min (by C-G formula based on SCr of 0.83 mg/dL). ------------------------------------------------------------------------------------------------------------------ No results for input(s): TSH, T4TOTAL, T3FREE, THYROIDAB in the last 72 hours.  Invalid input(s): FREET3   Coagulation  profile No results for input(s): INR, PROTIME in the last 168 hours. ------------------------------------------------------------------------------------------------------------------- No results for input(s): DDIMER in the last 72 hours. -------------------------------------------------------------------------------------------------------------------  Cardiac Enzymes No results for input(s): CKMB, TROPONINI, MYOGLOBIN in the last 168 hours.  Invalid input(s): CK ------------------------------------------------------------------------------------------------------------------ Invalid input(s): POCBNP  ---------------------------------------------------------------------------------------------------------------  Urinalysis    Component Value Date/Time   COLORURINE STRAW (A) 12/19/2017 1940   APPEARANCEUR CLEAR (A) 12/19/2017 1940   LABSPEC 1.006 12/19/2017 1940   PHURINE 5.0 12/19/2017 1940   GLUCOSEU NEGATIVE 12/19/2017 1940   HGBUR NEGATIVE 12/19/2017 1940   BILIRUBINUR NEGATIVE 12/19/2017 1940   KETONESUR NEGATIVE 12/19/2017 1940   PROTEINUR NEGATIVE 12/19/2017 1940   NITRITE NEGATIVE 12/19/2017 1940   LEUKOCYTESUR NEGATIVE 12/19/2017 1940     RADIOLOGY: Dg Chest Port 1 View  Result Date: 12/19/2017 CLINICAL DATA:  Fever.  History of lymphoma. EXAM: PORTABLE CHEST 1 VIEW COMPARISON:  Chest CT 12/09/2017 and chest x-ray 11/18/2017 FINDINGS: The right IJ Port-A-Cath is stable. The heart is normal in size. Stable tortuosity and calcification of the thoracic aorta. The lungs are clear. No pulmonary lesions or pleural effusion. Stable hazy right upper lobe scarring type changes. The bony thorax is intact. IMPRESSION: No acute pulmonary findings. Stable right upper lobe scarring type changes. Electronically Signed   By: Marijo Sanes M.D.   On: 12/19/2017 19:31    EKG: Orders placed or performed during the hospital encounter of 12/19/17  . ED EKG 12-Lead  . ED EKG 12-Lead     IMPRESSION AND PLAN:   1.  Neutropenic fever.  We will start broad-spectrum coverage antibiotics IV, cefepime and vancomycin.  There is no clear source of infection, at this point.  Urine culture and blood cultures are pending final result.  Will continue supportive measures.  Will place patient in isolation, with neutropenic precautions. 2.  Pancytopenia, status post chemo, the 5th CHOP cycle, 1 week ago.  WBC 0.1, platelet count 6; hemoglobin is 7.  We will transfuse red blood cells and platelets.  Continue to monitor clinically closely.  Repeat CBC daily.  Will consult hematology-oncology for further recommendations. 3.  Recurrent follicular lymphoma, currently undergoing chemotherapy.  We will continue management per hematology oncology services. 4.  Malnutrition.  Patient has had very poor appetite and she has lost weight since on chemotherapy.  Her albumin level is low, at 2.9.  She has refused protein shakes that were recommended as supplement to her diet.  We will have dietitian further evaluate and treat the patient.  Continue Megace as appetite stimulant for now.  All the records are reviewed and case discussed with ED provider. Management plans discussed with the patient, family and they are in agreement.  CODE STATUS:    Code Status Orders  (From admission, onward)  Start     Ordered   12/19/17 2244  Full code  Continuous     12/19/17 2243    Code Status History    Date Active Date Inactive Code Status Order ID Comments User Context   This patient has a current code status but no historical code status.    Advance Directive Documentation     Most Recent Value  Type of Advance Directive  Living will  Pre-existing out of facility DNR order (yellow form or pink MOST form)  No data  "MOST" Form in Place?  No data       TOTAL TIME TAKING CARE OF THIS PATIENT: 45 minutes.    Amelia Jo M.D on 12/19/2017 at 11:09 PM  Between 7am to 6pm - Pager -  [phone]  After 6pm go to [email or web address] - password [email or web address] [email or web address]  [facility]  Office  [phone]  CC: Primary care physician; Glendon Axe, MD

## 2017-12-19 NOTE — Progress Notes (Signed)
CODE SEPSIS - PHARMACY COMMUNICATION  **Broad Spectrum Antibiotics should be administered within 1 hour of Sepsis diagnosis**  Time Code Sepsis Called/Page Received: 1915  Antibiotics Ordered: vancomycin + piperacillin/tazobactam  Time of 1st antibiotic administration: 1939  Additional action taken by pharmacy: n/a  If necessary, Name of Provider/Nurse Contacted: [place] ,PharmD Clinical Pharmacist  12/19/2017  7:49 PM

## 2017-12-19 NOTE — ED Provider Notes (Addendum)
-----------------------------------------   8:54 PM on 12/19/2017 -----------------------------------------  Patient care assumed from Dr. Clearnce Hasten.  Patient's labs have resulted showing significant abnormalities with a total white blood cell count of 0.1 with an absolute neutrophil count of 0.0.  Patient's hemoglobin has decreased from 8.5 down to 7.0.  Platelets have decreased from 144 now to 6.  I discussed the patient with Dr. Janese Banks.  Recommends continuing with IV antibiotics, transfusing platelets, red blood cells and admitting to the hospitalist service for further treatment.  I discussed this plan of care with the patient and family they are agreeable.  Patient under neutropenic precautions.   Harvest Dark, MD 12/19/17 2054  I have verbally consented the patient for transfusion of blood products.  We will have the patient sign a consent form.   Harvest Dark, MD 12/19/17 2056

## 2017-12-20 ENCOUNTER — Inpatient Hospital Stay: Payer: Medicare Other

## 2017-12-20 ENCOUNTER — Encounter: Payer: Self-pay | Admitting: Radiology

## 2017-12-20 DIAGNOSIS — D709 Neutropenia, unspecified: Secondary | ICD-10-CM

## 2017-12-20 DIAGNOSIS — D61810 Antineoplastic chemotherapy induced pancytopenia: Secondary | ICD-10-CM

## 2017-12-20 DIAGNOSIS — R5081 Fever presenting with conditions classified elsewhere: Secondary | ICD-10-CM

## 2017-12-20 DIAGNOSIS — D696 Thrombocytopenia, unspecified: Secondary | ICD-10-CM

## 2017-12-20 DIAGNOSIS — C8208 Follicular lymphoma grade I, lymph nodes of multiple sites: Secondary | ICD-10-CM

## 2017-12-20 DIAGNOSIS — D61818 Other pancytopenia: Secondary | ICD-10-CM

## 2017-12-20 DIAGNOSIS — E871 Hypo-osmolality and hyponatremia: Secondary | ICD-10-CM

## 2017-12-20 DIAGNOSIS — I499 Cardiac arrhythmia, unspecified: Secondary | ICD-10-CM

## 2017-12-20 DIAGNOSIS — R7881 Bacteremia: Secondary | ICD-10-CM

## 2017-12-20 DIAGNOSIS — R110 Nausea: Secondary | ICD-10-CM

## 2017-12-20 DIAGNOSIS — D649 Anemia, unspecified: Secondary | ICD-10-CM

## 2017-12-20 DIAGNOSIS — E43 Unspecified severe protein-calorie malnutrition: Secondary | ICD-10-CM

## 2017-12-20 LAB — BLOOD CULTURE ID PANEL (REFLEXED)
Acinetobacter baumannii: NOT DETECTED
CANDIDA GLABRATA: NOT DETECTED
Candida albicans: NOT DETECTED
Candida krusei: NOT DETECTED
Candida parapsilosis: NOT DETECTED
Candida tropicalis: NOT DETECTED
Carbapenem resistance: NOT DETECTED
ENTEROBACTERIACEAE SPECIES: DETECTED — AB
ESCHERICHIA COLI: DETECTED — AB
Enterobacter cloacae complex: NOT DETECTED
Enterococcus species: NOT DETECTED
Haemophilus influenzae: NOT DETECTED
KLEBSIELLA OXYTOCA: NOT DETECTED
Klebsiella pneumoniae: NOT DETECTED
Listeria monocytogenes: NOT DETECTED
NEISSERIA MENINGITIDIS: NOT DETECTED
PSEUDOMONAS AERUGINOSA: NOT DETECTED
Proteus species: NOT DETECTED
SERRATIA MARCESCENS: NOT DETECTED
STAPHYLOCOCCUS AUREUS BCID: NOT DETECTED
STAPHYLOCOCCUS SPECIES: NOT DETECTED
STREPTOCOCCUS AGALACTIAE: NOT DETECTED
STREPTOCOCCUS PNEUMONIAE: NOT DETECTED
STREPTOCOCCUS PYOGENES: NOT DETECTED
Streptococcus species: NOT DETECTED

## 2017-12-20 LAB — BASIC METABOLIC PANEL
ANION GAP: 7 (ref 5–15)
Anion gap: 7 (ref 5–15)
BUN: 10 mg/dL (ref 6–20)
BUN: 12 mg/dL (ref 6–20)
CALCIUM: 7.8 mg/dL — AB (ref 8.9–10.3)
CHLORIDE: 106 mmol/L (ref 101–111)
CO2: 22 mmol/L (ref 22–32)
CO2: 22 mmol/L (ref 22–32)
CREATININE: 0.67 mg/dL (ref 0.44–1.00)
Calcium: 7 mg/dL — ABNORMAL LOW (ref 8.9–10.3)
Chloride: 102 mmol/L (ref 101–111)
Creatinine, Ser: 0.49 mg/dL (ref 0.44–1.00)
GFR calc Af Amer: >60 mL/min (ref >60)
GFR calc non Af Amer: >60 mL/min (ref >60)
GLUCOSE: 119 mg/dL — AB (ref 65–99)
GLUCOSE: 140 mg/dL — AB (ref 65–99)
Potassium: 2.8 mmol/L — ABNORMAL LOW (ref 3.5–5.1)
Potassium: 3.5 mmol/L (ref 3.5–5.1)
Sodium: 135 mmol/L (ref 135–145)
Sodium: 131 mmol/L — ABNORMAL LOW (ref 135–145)

## 2017-12-20 LAB — PROTIME-INR
INR: 1.28
Prothrombin Time: 15.9 seconds — ABNORMAL HIGH (ref 11.4–15.2)

## 2017-12-20 LAB — BPAM RBC
BLOOD PRODUCT EXPIRATION DATE: 201902182359
UNIT TYPE AND RH: 9500

## 2017-12-20 LAB — TYPE AND SCREEN
ABO/RH(D): O NEG
ANTIBODY SCREEN: NEGATIVE
UNIT DIVISION: 0

## 2017-12-20 LAB — CBC
HEMATOCRIT: 24.9 % — AB (ref 35.0–47.0)
HEMOGLOBIN: 8.4 g/dL — AB (ref 12.0–16.0)
MCH: 30.4 pg (ref 26.0–34.0)
MCHC: 33.9 g/dL (ref 32.0–36.0)
MCV: 89.8 fL (ref 80.0–100.0)
Platelets: 47 10*3/uL — ABNORMAL LOW (ref 150–440)
RBC: 2.77 MIL/uL — ABNORMAL LOW (ref 3.80–5.20)
RDW: 24 % — AB (ref 11.5–14.5)
WBC: 0.1 10*3/uL — AB (ref 3.6–11.0)

## 2017-12-20 LAB — MRSA PCR SCREENING: MRSA BY PCR: NEGATIVE

## 2017-12-20 LAB — GLUCOSE, CAPILLARY
GLUCOSE-CAPILLARY: 103 mg/dL — AB (ref 65–99)
Glucose-Capillary: 135 mg/dL — ABNORMAL HIGH (ref 65–99)

## 2017-12-20 LAB — LACTIC ACID, PLASMA: LACTIC ACID, VENOUS: 0.9 mmol/L (ref 0.5–1.9)

## 2017-12-20 LAB — MAGNESIUM
Magnesium: 1.4 mg/dL — ABNORMAL LOW (ref 1.7–2.4)
Magnesium: 2.3 mg/dL (ref 1.7–2.4)

## 2017-12-20 LAB — PHOSPHORUS
PHOSPHORUS: 3.6 mg/dL (ref 2.5–4.6)
Phosphorus: 2.4 mg/dL — ABNORMAL LOW (ref 2.5–4.6)

## 2017-12-20 LAB — PREPARE RBC (CROSSMATCH)

## 2017-12-20 MED ORDER — SODIUM CHLORIDE 0.9 % IV SOLN
2.0000 g | Freq: Two times a day (BID) | INTRAVENOUS | Status: DC
  Administered 2017-12-20: 2 g via INTRAVENOUS
  Filled 2017-12-20 (×3): qty 2

## 2017-12-20 MED ORDER — MAGNESIUM SULFATE 2 GM/50ML IV SOLN
2.0000 g | Freq: Once | INTRAVENOUS | Status: AC
  Administered 2017-12-20: 2 g via INTRAVENOUS
  Filled 2017-12-20: qty 50

## 2017-12-20 MED ORDER — NOREPINEPHRINE BITARTRATE 1 MG/ML IV SOLN
0.0000 ug/min | INTRAVENOUS | Status: DC
  Administered 2017-12-20: 2 ug/min via INTRAVENOUS
  Filled 2017-12-20: qty 4

## 2017-12-20 MED ORDER — PROMETHAZINE HCL 25 MG/ML IJ SOLN
6.2500 mg | Freq: Four times a day (QID) | INTRAMUSCULAR | Status: DC | PRN
  Administered 2017-12-20: 6.25 mg via INTRAVENOUS
  Filled 2017-12-20: qty 1

## 2017-12-20 MED ORDER — SODIUM CHLORIDE 0.9 % IV SOLN
1.0000 g | Freq: Two times a day (BID) | INTRAVENOUS | Status: DC
  Administered 2017-12-20 – 2017-12-22 (×5): 1 g via INTRAVENOUS
  Filled 2017-12-20 (×6): qty 1

## 2017-12-20 MED ORDER — PANTOPRAZOLE SODIUM 40 MG IV SOLR
40.0000 mg | INTRAVENOUS | Status: DC
  Administered 2017-12-21 – 2017-12-26 (×7): 40 mg via INTRAVENOUS
  Filled 2017-12-20 (×7): qty 40

## 2017-12-20 MED ORDER — METOCLOPRAMIDE HCL 5 MG/ML IJ SOLN
10.0000 mg | Freq: Four times a day (QID) | INTRAMUSCULAR | Status: AC
  Administered 2017-12-20 – 2017-12-21 (×4): 10 mg via INTRAVENOUS
  Filled 2017-12-20 (×4): qty 2

## 2017-12-20 MED ORDER — SODIUM CHLORIDE 0.9 % IV BOLUS (SEPSIS)
1000.0000 mL | Freq: Once | INTRAVENOUS | Status: AC
  Administered 2017-12-20: 1000 mL via INTRAVENOUS

## 2017-12-20 MED ORDER — POTASSIUM PHOSPHATES 15 MMOLE/5ML IV SOLN
30.0000 mmol | Freq: Once | INTRAVENOUS | Status: AC
  Administered 2017-12-20: 30 mmol via INTRAVENOUS
  Filled 2017-12-20: qty 10

## 2017-12-20 MED ORDER — SODIUM CHLORIDE 0.9 % IV SOLN
Freq: Once | INTRAVENOUS | Status: AC
  Administered 2017-12-20: 02:00:00 via INTRAVENOUS

## 2017-12-20 MED ORDER — IOPAMIDOL (ISOVUE-300) INJECTION 61%
75.0000 mL | Freq: Once | INTRAVENOUS | Status: AC | PRN
  Administered 2017-12-20: 75 mL via INTRAVENOUS

## 2017-12-20 MED ORDER — DRONABINOL 2.5 MG PO CAPS
5.0000 mg | ORAL_CAPSULE | Freq: Two times a day (BID) | ORAL | Status: DC
  Administered 2017-12-20 – 2017-12-27 (×14): 5 mg via ORAL
  Filled 2017-12-20 (×14): qty 2

## 2017-12-20 MED ORDER — FUROSEMIDE 10 MG/ML IJ SOLN
20.0000 mg | Freq: Once | INTRAMUSCULAR | Status: AC
  Administered 2017-12-20: 20 mg via INTRAVENOUS
  Filled 2017-12-20: qty 2

## 2017-12-20 NOTE — Progress Notes (Signed)
Pt A&Ox4. Decreased appetite today. Pt had complaints of nausea throughout shift. Given Zofran and phenergan with little to no relief.  Also with complaints of abdominal pain throughout shift.  Given Norco with little to no relief.  Tmax 99.7 this shift with temp trending upward.    Discussed with Dr. Celesta Aver, orders received for CT of abdomen and pelvis with IV contrast and IV Reglan for nausea.    Will continue to monitor.

## 2017-12-20 NOTE — Progress Notes (Addendum)
[facility] [place] NAME: Veronica Boyd    MR#:  [id number]  DATE OF BIRTH:  [birth year]  SUBJECTIVE:  CHIEF COMPLAINT:   Chief Complaint  Patient presents with  . Fever   Generalized weakness and poor oral intake. REVIEW OF SYSTEMS:  Review of Systems  Constitutional: Positive for malaise/fatigue. Negative for chills and fever.  HENT: Negative for sore throat.   Eyes: Negative for blurred vision and double vision.  Respiratory: Negative for cough, hemoptysis, shortness of breath, wheezing and stridor.   Cardiovascular: Negative for chest pain, palpitations, orthopnea and leg swelling.  Gastrointestinal: Negative for abdominal pain, blood in stool, diarrhea, melena, nausea and vomiting.  Genitourinary: Negative for dysuria, flank pain and hematuria.  Musculoskeletal: Negative for back pain and joint pain.  Neurological: Positive for weakness. Negative for dizziness, sensory change, focal weakness, seizures, loss of consciousness and headaches.  Endo/Heme/Allergies: Negative for polydipsia.  Psychiatric/Behavioral: Negative for depression. The patient is not nervous/anxious.     DRUG ALLERGIES:  No Known Allergies VITALS:  Blood pressure 131/86, pulse (!) 113, temperature 98.3 F (36.8 C), temperature source Oral, resp. rate (!) 33, height 4\' 11"  (1.499 m), weight 100 lb 6.4 oz (45.5 kg), SpO2 92 %. PHYSICAL EXAMINATION:  Physical Exam  Constitutional: She is oriented to person, place, and time.  Severe malnutrition.  HENT:  Head: Normocephalic.  Mouth/Throat: Oropharynx is clear and moist.  Eyes: Conjunctivae and EOM are normal. Pupils are equal, round, and reactive to light. No scleral icterus.  Neck: Normal range of motion. Neck supple. No JVD present. No tracheal deviation present.  Cardiovascular: Normal rate, regular rhythm and normal heart sounds. Exam reveals no gallop.  No murmur heard. Pulmonary/Chest: Effort normal  and breath sounds normal. No respiratory distress. She has no wheezes. She has no rales.  Abdominal: Soft. Bowel sounds are normal. She exhibits no distension. There is no tenderness. There is no rebound.  Musculoskeletal: Normal range of motion. She exhibits no edema or tenderness.  Neurological: She is alert and oriented to person, place, and time. No cranial nerve deficit.  Skin: No rash noted. No erythema.  Psychiatric: Affect normal.   LABORATORY PANEL:  Female CBC Recent Labs  Lab 12/20/17 0923  WBC 0.1*  HGB 8.4*  HCT 24.9*  PLT 47*   ------------------------------------------------------------------------------------------------------------------ Chemistries  Recent Labs  Lab 12/19/17 1910  12/20/17 0923  NA 130*   < > 131*  K 2.9*   < > 3.5  CL 96*   < > 102  CO2 22   < > 22  GLUCOSE 118*   < > 140*  BUN 13   < > 12  CREATININE 0.83   < > 0.49  CALCIUM 8.5*   < > 7.8*  MG  --    < > 2.3  AST 20  --   --   ALT 11*  --   --   ALKPHOS 34*  --   --   BILITOT 0.8  --   --    < > = values in this interval not displayed.   RADIOLOGY:  Dg Chest Port 1 View  Result Date: 12/19/2017 CLINICAL DATA:  Fever.  History of lymphoma. EXAM: PORTABLE CHEST 1 VIEW COMPARISON:  Chest CT 12/09/2017 and chest x-ray 11/18/2017 FINDINGS: The right IJ Port-A-Cath is stable. The heart is normal in size. Stable tortuosity and calcification of the thoracic aorta. The lungs are clear. No pulmonary lesions or  pleural effusion. Stable hazy right upper lobe scarring type changes. The bony thorax is intact. IMPRESSION: No acute pulmonary findings. Stable right upper lobe scarring type changes. Electronically Signed   By: Marijo Sanes M.D.   On: 12/19/2017 19:31   ASSESSMENT AND PLAN:   1.    Sepsis with neutropenic fever due to bacteremia.   Continue meropenem and vancomycin.   Blood culture showed E. coli and Enterobacteriaceae species.  Follow-up final blood culture report. neutropenic  precautions.  Follow-up ID consult.  2.  Pancytopenia, status post chemo, the 5th CHOP cycle, 1 week ago.  status post PRBC and platelet transfusion. Dr. Janese Banks recommends blood transfusion if H&H less than 7/21 or platelet count less than 10 or evidence of clinically bleeding. Patient already received on pro-Neulasta with chemotherapy, no Neupogen this time per Dr. Janese Banks. Follow-up CBC.  3.  Recurrent follicular lymphoma, currently undergoing chemotherapy.    Follow-up oncologist as outpatient.    4. Severe malnutrition.  Patient has had very poor appetite and she has lost weight since on chemotherapy.  Her albumin level is low, at 2.9.  She has refused protein shakes that were recommended as supplement to her diet.   Continue Megace as appetite stimulant for now. Dietitian consult.  Hyponatremia.  Continue normal saline IV and follow-up BMP. All the records are reviewed and case discussed with Care Management/Social Worker. Management plans discussed with the patient, her daughter and they are in agreement.  CODE STATUS: Full Code  TOTAL TIME TAKING CARE OF THIS PATIENT: 38 minutes.   More than 50% of the time was spent in counseling/coordination of care: YES  POSSIBLE D/C IN 3 DAYS, DEPENDING ON CLINICAL CONDITION.   Demetrios Loll M.D on 12/20/2017 at 4:07 PM  Between 7am to 6pm - Pager - [phone]  After 6pm go to [email or web address] - [facility]

## 2017-12-20 NOTE — Progress Notes (Signed)
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  She also complained of abdominal pain and back pain.  He takes hydrocodone at home and reports very minimal relief with home dose.  Her last chemo session was 1 week ago.  She denies any other symptoms. Her ED workup showed severe hypokalemia, mild hyponatremia, lactic acidosis, severe neutropenia with a WBC of 0.1, hemoglobin of 7.0, hematocrit of 20.7, and platelets of 6000.  She is being admitted to the ICU for further management.  2/14 Admit   REVIEW OF SYSTEMS:   Still having nausea   SUBJECTIVE:  Patient reports she feels better but still discomfort from nausea.  VITAL SIGNS: BP (!) 149/69   Pulse (!) 102   Temp 98.6 F (37 C) (Oral)   Resp (!) 25   Ht 4\' 11"  (1.499 m)   Wt 100 lb 6.4 oz (45.5 kg)   SpO2 96%   BMI 20.28 kg/m   HEMODYNAMICS:  no compromise    INTAKE / OUTPUT: I/O last 3 completed shifts: In: 4396.9 [I.V.:1871.9; Blood:1865; IV Piggyback:660] Out: 7628 [Urine:1550]  PHYSICAL EXAMINATION: General:  No acute distress Neuro:  Awake, alert X 3 HEENT:  PERRL, No JVD, moist mucous membranes Cardiovascular:  RRR Lungs:  CTA, port site clear of erythema Abdomen:  Soft, mildly tender Musculoskeletal:  No deformity Skin:  Warm and dry  LABS:  BMET Recent Labs  Lab 12/19/17 1910 12/19/17 2354 12/20/17 0923  NA 130* 135 131*  K 2.9* 2.8* 3.5  CL 96* 106 102  CO2 22 22 22   BUN 13 10 12   CREATININE 0.83 0.67 0.49  GLUCOSE 118* 119* 140*    Electrolytes Recent Labs  Lab 12/19/17 1910 12/19/17 2354 12/20/17 0923  CALCIUM 8.5* 7.0* 7.8*  MG  --  1.4* 2.3  PHOS  --  2.4* 3.6    CBC Recent Labs  Lab 12/19/17 1910 12/20/17 0923  WBC  0.1* 0.1*  HGB 7.0* 8.4*  HCT 20.7* 24.9*  PLT 6* 47*    Coag's Recent Labs  Lab 12/19/17 2354  INR 1.28    Sepsis Markers Recent Labs  Lab 12/19/17 1910 12/19/17 2204 12/20/17 1835  LATICACIDVEN 2.7* 3.1* 0.9    ABG No results for input(s): PHART, PCO2ART, PO2ART in the last 168 hours.  Liver Enzymes Recent Labs  Lab 12/19/17 1910  AST 20  ALT 11*  ALKPHOS 34*  BILITOT 0.8  ALBUMIN 2.9*    Cardiac Enzymes No results for input(s): TROPONINI, PROBNP in the last 168 hours.  Glucose Recent Labs  Lab 12/19/17 2307 12/20/17 0912  GLUCAP 103* 135*    Imaging Ct Abdomen Pelvis W Contrast  Result Date: 12/20/2017 CLINICAL DATA:  Decreased appetite.  Nausea. EXAM: CT ABDOMEN AND PELVIS WITH CONTRAST TECHNIQUE: Multidetector CT imaging of the abdomen and pelvis was performed using the standard protocol following bolus administration of intravenous contrast. CONTRAST:  31mL ISOVUE-300 IOPAMIDOL (ISOVUE-300) INJECTION 61% COMPARISON:  December 09, 2017 FINDINGS: Lower chest: New pleural effusions are identified, right greater than left, relatively small. Associated atelectasis is noted. A pectus deformity is noted in the lower chest, unchanged. No nodules, masses, or suspicious infiltrates are identified in the lower chest. Hepatobiliary: The liver  is normal in appearance. The gallbladder is more distended in the interval with no obvious stones or definitive wall thickening. There is a small amount of fluid adjacent to the gallbladder which is nonspecific given mild ascites elsewhere which will be described below. The portal vein is patent. Pancreas: Unremarkable. No pancreatic ductal dilatation or surrounding inflammatory changes. Spleen: Normal in size without focal abnormality. Adrenals/Urinary Tract: Adrenal glands are normal. The kidneys, ureters, and bladder are normal. Stomach/Bowel: The stomach appears thick walled but is poorly evaluated due to lack of distention. There  was no wall thickening on the December 09, 2017 study. The small bowel is normal. There appears to be rectal wall thickening as well such as on series 2, image 68 and coronal image 52. No adjacent fat stranding identified. Scattered colonic diverticuli without diverticulitis. The remainder of the colon is otherwise normal. The appendix is normal in appearance and best seen on coronal imaging. Vascular/Lymphatic: Atherosclerotic changes is seen in the nonaneurysmal aorta. No adenopathy. Reproductive: Uterus and bilateral adnexa are unremarkable. Other: Ascites in the abdomen adjacent to the liver, spleen, gallbladder, and in the pericolic gutters and pelvis. Musculoskeletal: No acute or significant osseous findings. IMPRESSION: 1. The stomach wall appears thickened. This could be due to poor distention but gastritis is not excluded. Recommend clinical correlation. 2. The rectal wall appears thickened as well, new in the interval, suggesting proctitis. Recommend clinical correlation. 3. New bilateral pleural effusions and ascites. 4. The gallbladder is mildly distended but there is no definitive cholelithiasis or wall thickening. The fluid adjacent to the gallbladder is likely due to the ascites. An ultrasound could better evaluate if warranted. 5. Electronically Signed   By: Dorise Bullion III M.D   On: 12/20/2017 20:07     STUDIES:  CT scan as above  CULTURES: 2/14 GNB bacteremia  ANTIBIOTICS: Meropenem vancomycin  SIGNIFICANT EVENTS: Persistent nausea  LINES/TUBES: Porta cath  DISCUSSION: 72 year old female with active follicular lymphoma, admitted with neutropenic sepsis, anemia of chronic disease and severe thrombocytopenia. She was found to have GNB bacteremia     ASSESSMENT / PLAN:  1. Sepsis 2. Gram negative bacteremia of unclear source.  Urinalysis and GI source not found 3. Persistent nausea- may be prolonged effect of Chemo 4. Pancytopenia from chemotherapy 5. Hx of  Progressive follicular lymphoma on chemotherapy. Last chemo 12/13/17 6. Hyponatremia  Plan: 1. Continue IVF 2. Continue ABX, ID consult 3. Reglan and Phenergan added to Zofran for nausea. Will consider Marinol and D/C Megace. 4. Continue IVF  Prognosis guarded.   FAMILY  - Updates: son, daughter and daughter in law.  I have dedicated a total of 40 minutes in critical care time minus all appropriate exclusions.   Cammie Sickle, M.D [facility] [place] Pager: [phone]  12/20/2017, 8:24 PM

## 2017-12-20 NOTE — Progress Notes (Signed)
PHARMACY - PHYSICIAN COMMUNICATION CRITICAL VALUE ALERT - BLOOD CULTURE IDENTIFICATION (BCID)  Veronica Boyd is an 72 y.o. female who presented to [facility] on 12/19/2017 with a chief complaint of sepsis. Patient has bacteremia in the setting of pancytopenia.   Assessment:  Bacteremia   Name of physician (or Provider) ContactedOla Spurr   Current antibiotics: Cefepime/Vancomycin  Changes to prescribed antibiotics recommended:  Recommendations accepted by provider - Meropenem. Continue vancomycin until able to see.   Results for orders placed or performed during the hospital encounter of 12/19/17  Blood Culture ID Panel (Reflexed) (Collected: 12/19/2017  7:10 PM)  Result Value Ref Range   Enterococcus species NOT DETECTED NOT DETECTED   Listeria monocytogenes NOT DETECTED NOT DETECTED   Staphylococcus species NOT DETECTED NOT DETECTED   Staphylococcus aureus NOT DETECTED NOT DETECTED   Streptococcus species NOT DETECTED NOT DETECTED   Streptococcus agalactiae NOT DETECTED NOT DETECTED   Streptococcus pneumoniae NOT DETECTED NOT DETECTED   Streptococcus pyogenes NOT DETECTED NOT DETECTED   Acinetobacter baumannii NOT DETECTED NOT DETECTED   Enterobacteriaceae species DETECTED (A) NOT DETECTED   Enterobacter cloacae complex NOT DETECTED NOT DETECTED   Escherichia coli DETECTED (A) NOT DETECTED   Klebsiella oxytoca NOT DETECTED NOT DETECTED   Klebsiella pneumoniae NOT DETECTED NOT DETECTED   Proteus species NOT DETECTED NOT DETECTED   Serratia marcescens NOT DETECTED NOT DETECTED   Carbapenem resistance NOT DETECTED NOT DETECTED   Haemophilus influenzae NOT DETECTED NOT DETECTED   Neisseria meningitidis NOT DETECTED NOT DETECTED   Pseudomonas aeruginosa NOT DETECTED NOT DETECTED   Candida albicans NOT DETECTED NOT DETECTED   Candida glabrata NOT DETECTED NOT DETECTED   Candida krusei NOT DETECTED NOT DETECTED   Candida parapsilosis NOT DETECTED NOT DETECTED   Candida  tropicalis NOT DETECTED NOT DETECTED    Simpson,Michael L 12/20/2017  9:23 AM

## 2017-12-20 NOTE — Consult Note (Signed)
Hematology/Oncology Consult note [facility] [phone] [phone]  Patient Care Team: Glendon Axe, MD as PCP - General (Internal Medicine) Leonie Green, MD as Referring Physician (Surgery)   Name of the patient: Veronica Boyd  [id number]  03/06/1946    Reason for consult: h/o follicular lymphoma currently undergoing RCHOP chemotherapy   Requesting physician: Dr. Bridgett Larsson  Date of visit: 12/20/2017    History of presenting illness-patient is a 72 year old female who sees Dr. Grayland Ormond as an outpatient for her recurrent follicular lymphoma.  She has received weekly right toxin in the past followed by Zevalin treatment in the past.  She was then found to have progressive disease and was initiated on R CHOP chemotherapy.  She was last seen in our clinic on 12/12/2017 and received cycle #4 of R CHOP chemotherapy with on pro-Neulasta support.  At baseline patient is elderly and frail and she has mild leukopenia with mainly lymphopenia.  She has been having worsening anemia throughout the course of chemotherapy.  Prior to R CHOP her hemoglobin was around 11 and gradually trending down and was noted to be 8.3 on 12/12/2017.  Platelet count on that day was 144.    She did have interim scans after 3 cycles of R CHOP which showed interval response to therapy and resolution of mediastinal axillary and left internal mammary adenopathy.  Also resolution of lesions within the right breast were noted as well as resolution of the pleural nodularity.  There was a persistent soft tissue mass in the right axilla which was slightly increased as compared to previous exam concerning for residual tumor.  I received a call from patient's daughter yesterday stating that she was having a high-grade fever of 102 along with chills and I advised her to go to the ER.  On arrival to the ER patient was noted to be hypotensive with a systolic blood pressure in the 60s which  improved to the 80s after IV fluids.  CBC showed a white count of 0.1 with an ANC of 0, H&H of 7/20.7 with an MCV of 102.9 and a platelet count of 6.  Blood cultures show evidence of E. coli as well as Enterobacteriaceae.  Lactic acid was elevated at 2.7.  Urinalysis was negative.  Chest x-ray was unremarkable.  CMP showed hyponatremia with a sodium of 130 along with hypokalemia with a potassium of 2.9.  AST ALT was within normal limits and serum creatinine was normal at 0.83.Marland Kitchen  Patient received blood transfusion and platelet transfusion and her hemoglobin came up to 8.4 and a platelet count came up to 47.  She continues to be neutropenic with a white count of 0.1.She has been afebrile since admission.  Patient currently reports feeling fatigued. She has had ongoing diarrhea over last few days which is getting better. Denies any sob  ECOG PS- 3  Pain scale- 3   Review of systems- Review of Systems  Constitutional: Positive for malaise/fatigue. Negative for chills, fever and weight loss.  HENT: Negative for congestion, ear discharge and nosebleeds.   Eyes: Negative for blurred vision.  Respiratory: Negative for cough, hemoptysis, sputum production, shortness of breath and wheezing.   Cardiovascular: Negative for chest pain, palpitations, orthopnea and claudication.  Gastrointestinal: Positive for abdominal pain and diarrhea. Negative for blood in stool, constipation, heartburn, melena, nausea and vomiting.  Genitourinary: Negative for dysuria, flank pain, frequency, hematuria and urgency.  Musculoskeletal: Negative for back pain, joint pain and myalgias.  Skin: Negative for rash.  Neurological: Positive for weakness. Negative for dizziness, tingling, focal weakness, seizures and headaches.  Endo/Heme/Allergies: Does not bruise/bleed easily.  Psychiatric/Behavioral: Negative for depression and suicidal ideas. The patient does not have insomnia.     No Known Allergies  Patient Active Problem  List   Diagnosis Date Noted  . Anemia   . Thrombocytopenia ([place])   . Sepsis ([place]) 12/19/2017  . Anemia due to antineoplastic chemotherapy 12/17/2017  . Goals of care, counseling/discussion 09/19/2017  . Pure hypercholesterolemia 09/10/2016  . 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 Follicular lymphoma ([place]) 07/24/2016  . Mass of chest wall, right 06/06/2016  . Primary osteoarthritis involving multiple joints 12/14/2015  . Transient insomnia 08/27/2015  . Mixed hyperlipidemia 03/01/2015  . Nocturnal muscle cramps 03/01/2015  . Osteoporosis 11/08/2014  . Primary osteoarthritis of both knees 07/05/2014  . Hyperlipidemia, unspecified 03/19/2014  . Osteoarthritis of knee 03/19/2014  . Abnormal mammogram 03/03/2013  . Vitamin D deficiency 05/09/2012     Past Medical History:  Diagnosis Date  . Arthritis   . Cataract    bilateral  . Chicken pox   . Colon polyp   . Follicular lymphoma ([place]) 08/2016   lymph nodes   . Hyperlipidemia   . Osteoporosis      Past Surgical History:  Procedure Laterality Date  . AXILLARY LYMPH NODE DISSECTION Right 08/21/2016   Procedure: AXILLARY LYMPH NODE excision;  Surgeon: Leonie Green, MD;  Location: [facility] ORS;  Service: General;  Laterality: Right;  . CATARACT EXTRACTION, BILATERAL Bilateral   . JOINT REPLACEMENT Left   . PORTA CATH INSERTION N/A 09/16/2017   Procedure: PORTA CATH INSERTION;  Surgeon: Algernon Huxley, MD;  Location: [facility];  Service: Cardiovascular;  Laterality: N/A;  . TONSILLECTOMY    . TOTAL HIP ARTHROPLASTY Left 1992    Social History   Socioeconomic History  . Marital status: Married    Spouse name: Not on file  . Number of children: Not on file  . Years of education: Not on file  . Highest education level: Not on file  Social Needs  . Financial resource strain: Not on file  . Food insecurity - worry: Not on file  . Food insecurity - inability: Not on file  . Transportation needs - medical: Not on  file  . Transportation needs - non-medical: Not on file  Occupational History  . Not on file  Tobacco Use  . Smoking status: Never Smoker  . Smokeless tobacco: Never Used  Substance and Sexual Activity  . Alcohol use: No  . Drug use: No  . Sexual activity: Not on file  Other Topics Concern  . Not on file  Social History Narrative  . Not on file     Family History  Problem Relation Age of Onset  . Diabetes Sister   . Lung cancer Brother   . Diabetes Brother   . Basal cell carcinoma Daughter   . Breast cancer Paternal Aunt      Current Facility-Administered Medications:  .  0.9 %  sodium chloride infusion, 10 mL/hr, Intravenous, Once, Paduchowski, Lennette Bihari, MD .  0.9 %  sodium chloride infusion, , Intravenous, Continuous, Amelia Jo, MD, Last Rate: 100 mL/hr at 12/20/17 1000 .  acetaminophen (TYLENOL) tablet 650 mg, 650 mg, Oral, Q6H PRN **OR** acetaminophen (TYLENOL) suppository 650 mg, 650 mg, Rectal, Q6H PRN, Amelia Jo, MD .  albuterol (PROVENTIL) (2.5 MG/3ML) 0.083% nebulizer solution 2.5 mg, 2.5 mg, Inhalation, Q6H PRN, Amelia Jo, MD .  bisacodyl (DULCOLAX) EC tablet 5 mg, 5 mg, Oral, Daily PRN, Amelia Jo, MD .  docusate sodium (COLACE) capsule 100 mg, 100 mg, Oral, BID, Amelia Jo, MD, 100 mg at 12/20/17 0038 .  HYDROcodone-acetaminophen (NORCO/VICODIN) 5-325 MG per tablet 1-2 tablet, 1-2 tablet, Oral, Q4H PRN, Amelia Jo, MD, 1 tablet at 12/20/17 [phone] .  megestrol (MEGACE) tablet 40 mg, 40 mg, Oral, Daily, Amelia Jo, MD, 40 mg at 12/20/17 8242 .  meropenem (MERREM) 1 g in sodium chloride 0.9 % 100 mL IVPB, 1 g, Intravenous, Q12H, Leonel Ramsay, MD .  multivitamin with minerals tablet 1 tablet, 1 tablet, Oral, Daily, Amelia Jo, MD .  norepinephrine (LEVOPHED) 4 mg in dextrose 5 % 250 mL (0.016 mg/mL) infusion, 0-40 mcg/min, Intravenous, Titrated, Tukov, Magadalene S, NP, Stopped at 12/20/17 0600 .  ondansetron (ZOFRAN) tablet 4 mg, 4 mg,  Oral, Q6H PRN **OR** ondansetron (ZOFRAN) injection 4 mg, 4 mg, Intravenous, Q6H PRN, Amelia Jo, MD, 4 mg at 12/20/17 3536 .  pantoprazole sodium (PROTONIX) 40 mg/20 mL oral suspension 20 mg, 20 mg, Oral, QHS, Amelia Jo, MD, 20 mg at 12/20/17 0038 .  potassium PHOSPHATE 30 mmol in dextrose 5 % 500 mL infusion, 30 mmol, Intravenous, Once, Tukov, Magadalene S, NP, Last Rate: 85 mL/hr at 12/20/17 0701, 30 mmol at 12/20/17 0701 .  prochlorperazine (COMPAZINE) tablet 10 mg, 10 mg, Oral, Q6H PRN, Amelia Jo, MD .  simvastatin (ZOCOR) tablet 20 mg, 20 mg, Oral, QHS, Amelia Jo, MD, 20 mg at 12/19/17 2300 .  traZODone (DESYREL) tablet 25 mg, 25 mg, Oral, QHS PRN, Amelia Jo, MD .  vancomycin (VANCOCIN) IVPB 750 mg/150 ml premix, 750 mg, Intravenous, Q24H, Amelia Jo, MD  Facility-Administered Medications Ordered in Other Encounters:  .  heparin lock flush 100 unit/mL, 500 Units, Intravenous, Once, Grayland Ormond, Kathlene November, MD .  sodium chloride flush (NS) 0.9 % injection 10 mL, 10 mL, Intravenous, PRN, Lloyd Huger, MD, 10 mL at 12/04/17 0901 .  yttrium-90 injection 14.4 millicurie, 31.5 millicurie, Intravenous, Once, [name], MD   Physical exam:  Vitals:   12/20/17 0730 12/20/17 0800 12/20/17 0900 12/20/17 1000  BP: (!) 135/59 130/68 (!) 144/70 125/62  Pulse: 91 86 89 82  Resp: (!) 22 (!) 28 (!) 27 (!) 29  Temp:  97.7 F (36.5 C)    TempSrc:  Oral    SpO2: 93% 95% 97% 95%  Weight:      Height:       Physical Exam  Constitutional: She is oriented to person, place, and time.  Thin elderly frail woman who appears fatigued but no acute distress  HENT:  Head: Normocephalic and atraumatic.  Eyes: EOM are normal. Pupils are equal, round, and reactive to light.  Neck: Normal range of motion.  Cardiovascular: Regular rhythm and normal heart sounds.  tachycardic  Pulmonary/Chest: Effort normal and breath sounds normal.  Abdominal: Soft. Bowel sounds are normal.    Neurological: She is alert and oriented to person, place, and time.  Skin: Skin is warm and dry.       CMP Latest Ref Rng & Units 12/20/2017  Glucose 65 - 99 mg/dL 140(H)  BUN 6 - 20 mg/dL 12  Creatinine 0.44 - 1.00 mg/dL 0.49  Sodium 135 - 145 mmol/L 131(L)  Potassium 3.5 - 5.1 mmol/L 3.5  Chloride 101 - 111 mmol/L 102  CO2 22 - 32 mmol/L 22  Calcium 8.9 - 10.3 mg/dL 7.8(L)  Total Protein 6.5 - 8.1  g/dL -  Total Bilirubin 0.3 - 1.2 mg/dL -  Alkaline Phos 38 - 126 U/L -  AST 15 - 41 U/L -  ALT 14 - 54 U/L -   CBC Latest Ref Rng & Units 12/20/2017  WBC 3.6 - 11.0 K/uL 0.1(LL)  Hemoglobin 12.0 - 16.0 g/dL 8.4(L)  Hematocrit 35.0 - 47.0 % 24.9(L)  Platelets 150 - 440 K/uL 47(L)    @IMAGES @  Ct Soft Tissue Neck W Contrast  Result Date: 12/09/2017 CLINICAL DATA:  72 y/o F; restaging of follicular lymphoma diagnosed 10/17 currently receiving chemotherapy. EXAM: CT NECK WITH CONTRAST TECHNIQUE: Multidetector CT imaging of the neck was performed using the standard protocol following the bolus administration of intravenous contrast. CONTRAST:  67mL ISOVUE-300 IOPAMIDOL (ISOVUE-300) INJECTION 61% COMPARISON:  Concurrent CT of chest abdomen and pelvis. 09/12/2017 CT of the neck. 04/19/2017, 11/07/2016, 08/01/2016 PET-CT. FINDINGS: Pharynx and larynx: Normal. No mass or swelling. Salivary glands: No inflammation, mass, or stone. Thyroid: Normal. Lymph nodes: No lymphadenopathy of the neck by imaging criteria. Partially visualized bulky mass in the right axilla, correlation with concurrent CT of chest findings recommended. Vascular: Mild calcific atherosclerosis of aortic arch and left carotid bifurcation without significant stenosis. Limited intracranial: Negative. Visualized orbits: Negative. Mastoids and visualized paranasal sinuses: Clear. Skeleton: Moderate cervical spondylosis with discogenic degenerative changes greatest at the C4-C6 levels and C3-4 minimal grade 1 anterolisthesis. No acute  osseous abnormality identified. Upper chest: Stable scarring in right lung apex. Other: None. IMPRESSION: 1. Decreased size of neck lymph nodes. No residual lymphadenopathy of the neck. 2. Partially visualize bulky mass in right axilla, correlation with concurrent CT of chest findings recommended. Electronically Signed   By: Kristine Garbe M.D.   On: 12/09/2017 15:53   Ct Chest W Contrast  Result Date: 12/09/2017 CLINICAL DATA:  Followup lymphoma. EXAM: CT CHEST, ABDOMEN, AND PELVIS WITH CONTRAST TECHNIQUE: Multidetector CT imaging of the chest, abdomen and pelvis was performed following the standard protocol during bolus administration of intravenous contrast. CONTRAST:  32mL ISOVUE-300 IOPAMIDOL (ISOVUE-300) INJECTION 61% COMPARISON:  09/12/2017 FINDINGS: CT CHEST FINDINGS Cardiovascular: Normal heart size. No pericardial effusion identified. Aortic atherosclerosis. Mediastinum/Nodes: Normal thyroid gland. The trachea appears patent and midline. Normal appearance of the esophagus. Previous enlarged right paratracheal node measures 0.5 cm, image 17 of series 4. Previously 1.8 cm. There has been resolution of previous anterior mediastinal prevascular adenopathy. The index left internal mammary lymph node measures 0.7 cm, image 21 of series 4. Previously 1.6 cm. Index left axillary node has resolved in the interval. Previously noted left breast lesions have resolved in the interval. The large round axillary mass persists measuring 4.1 x 4.3 x 5.4 cm (volume = 50 cm^3). Previously 4.4 x 3.6 x 5.1 cm (volume = 42 cm^3). Lungs/Pleura: There has been resolution of previous right pleural effusion. Resolution of previous right-sided paraspinal soft tissue overlying the right upper lobe. Previous pleural nodularity overlying the right lung has resolved in the interval. 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 No suspicious bone lesions. CT ABDOMEN PELVIS FINDINGS  Hepatobiliary: No focal liver abnormality is seen. No gallstones, gallbladder wall thickening, or biliary dilatation. Pancreas: Unremarkable. No pancreatic ductal dilatation or surrounding inflammatory changes. Spleen: Normal in size without focal abnormality. Adrenals/Urinary Tract: The adrenal glands are normal. Unremarkable appearance of both kidneys. The urinary bladder appears unremarkable. Stomach/Bowel: The stomach is normal. The small bowel loops have a normal course and caliber without obstruction. Normal appearance of the colon. Vascular/Lymphatic: Aortic  atherosclerosis without aneurysm. No adenopathy within the abdomen. No pelvic or inguinal adenopathy identified. Reproductive: The uterus is unremarkable.  No adnexal mass. Other: No free fluid or fluid collections identified. No peritoneal nodularity. Musculoskeletal: Previous left hip arthroplasty. There is a lucent lesion involving the left acetabulum measuring 3.6 by 2.3 cm. 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 IMPRESSION: 1. Continued interval response to therapy. When compared with 09/12/2017 there has been resolution of mediastinal, left axillary and left internal mammary adenopathy. The lesions within the right breast have also resolved in the interval. Resolution of right-sided pleural nodularity. 2. There is a persistent enlarged soft tissue mass within the right axilla which is slightly increased in volume compared with the previous exam and worrisome for residual tumor. Electronically Signed   By: Kerby Moors M.D.   On: 12/09/2017 16:58   Ct Abdomen Pelvis W Contrast  Result Date: 12/09/2017 CLINICAL DATA:  Followup lymphoma. EXAM: CT CHEST, ABDOMEN, AND PELVIS WITH CONTRAST TECHNIQUE: Multidetector CT imaging of the chest, abdomen and pelvis was performed following the standard protocol during bolus administration of intravenous contrast. CONTRAST:  38mL ISOVUE-300 IOPAMIDOL (ISOVUE-300) INJECTION 61%  COMPARISON:  09/12/2017 FINDINGS: CT CHEST FINDINGS Cardiovascular: Normal heart size. No pericardial effusion identified. Aortic atherosclerosis. Mediastinum/Nodes: Normal thyroid gland. The trachea appears patent and midline. Normal appearance of the esophagus. Previous enlarged right paratracheal node measures 0.5 cm, image 17 of series 4. Previously 1.8 cm. There has been resolution of previous anterior mediastinal prevascular adenopathy. The index left internal mammary lymph node measures 0.7 cm, image 21 of series 4. Previously 1.6 cm. Index left axillary node has resolved in the interval. Previously noted left breast lesions have resolved in the interval. The large round axillary mass persists measuring 4.1 x 4.3 x 5.4 cm (volume = 50 cm^3). Previously 4.4 x 3.6 x 5.1 cm (volume = 42 cm^3). Lungs/Pleura: There has been resolution of previous right pleural effusion. Resolution of previous right-sided paraspinal soft tissue overlying the right upper lobe. Previous pleural nodularity overlying the right lung has resolved in the interval. 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 No suspicious bone lesions. CT ABDOMEN PELVIS FINDINGS Hepatobiliary: No focal liver abnormality is seen. No gallstones, gallbladder wall thickening, or biliary dilatation. Pancreas: Unremarkable. No pancreatic ductal dilatation or surrounding inflammatory changes. Spleen: Normal in size without focal abnormality. Adrenals/Urinary Tract: The adrenal glands are normal. Unremarkable appearance of both kidneys. The urinary bladder appears unremarkable. Stomach/Bowel: The stomach is normal. The small bowel loops have a normal course and caliber without obstruction. Normal appearance of the colon. Vascular/Lymphatic: Aortic atherosclerosis without aneurysm. No adenopathy within the abdomen. No pelvic or inguinal adenopathy identified. Reproductive: The uterus is unremarkable.  No adnexal mass. Other: No free  fluid or fluid collections identified. No peritoneal nodularity. Musculoskeletal: Previous left hip arthroplasty. There is a lucent lesion involving the left acetabulum measuring 3.6 by 2.3 cm. 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 IMPRESSION: 1. Continued interval response to therapy. When compared with 09/12/2017 there has been resolution of mediastinal, left axillary and left internal mammary adenopathy. The lesions within the right breast have also resolved in the interval. Resolution of right-sided pleural nodularity. 2. There is a persistent enlarged soft tissue mass within the right axilla which is slightly increased in volume compared with the previous exam and worrisome for residual tumor. Electronically Signed   By: Kerby Moors M.D.   On: 12/09/2017 16:58   Dg Chest [facility]  1 View  Result Date: 12/19/2017 CLINICAL DATA:  Fever.  History of lymphoma. EXAM: PORTABLE CHEST 1 VIEW COMPARISON:  Chest CT 12/09/2017 and chest x-ray 11/18/2017 FINDINGS: The right IJ Port-A-Cath is stable. The heart is normal in size. Stable tortuosity and calcification of the thoracic aorta. The lungs are clear. No pulmonary lesions or pleural effusion. Stable hazy right upper lobe scarring type changes. The bony thorax is intact. IMPRESSION: No acute pulmonary findings. Stable right upper lobe scarring type changes. Electronically Signed   By: Marijo Sanes M.D.   On: 12/19/2017 19:31    Assessment and plan- Patient is a 72 y.o. female with recurrent follicular lymphoma status post 4 cycles of R CHOP chemotherapy last dose on 12/12/2017 admitted for severe sepsis secondary to E. coli bacteremia and neutropenic fever  1.  Neutropenic fever: Patient found to have 2 out of 2 blood cultures positive for E. coli and Enterobacteriaceae.  Source of gram-negative septicemia is currently unclear although intra-abdominal, respiratory and central venous catheters are usually the source of infection  especially in immunocompromised elderly population.  Currently patient is on meropenem and vancomycin.  Currently patient is afebrile but with patient were to have fevers I would recommend getting repeat blood cultures,  CT chest abdomen and pelvis with contrast to a certain the source of gram-negative septicemia and obtain ID consultation.  Patient already received on pro-Neulasta with her 4 cycles of R CHOP chemotherapy and she is at her expected nadir 7-10 days after chemotherapy.  I would not recommend Neupogen at this time since she already received Neulasta.  2.  Pancytopenia secondary to chemotherapy: Patient is status post PRBC and platelet transfusion with improvement of counts.  Recommend blood transfusion if H&H less than 7/21 or platelet count less than 10 or evidence of clinically bleeding.  I will continue to follow the patient while she is inpatient  Visit Diagnosis 1. Sepsis, due to unspecified organism ([place])   2. Neutropenic fever ([place])   3. Thrombocytopenia ([place])   4. Anemia, unspecified type     Dr. Randa Evens, MD, MPH [facility] at [facility] Pager- [phone] 12/20/2017 1:18 PM

## 2017-12-20 NOTE — Progress Notes (Signed)
Initial Nutrition Assessment  DOCUMENTATION CODES:   Severe malnutrition in context of chronic illness  INTERVENTION:  Boost Breeze po TID, each supplement provides 250 kcal and 9 grams of protein Snacks ordered  NUTRITION DIAGNOSIS:   Severe Malnutrition related to chronic illness as evidenced by severe fat depletion, severe muscle depletion.  GOAL:   Patient will meet greater than or equal to 90% of their needs  MONITOR:   PO intake, I & O's, Labs, Supplement acceptance, Weight trends  REASON FOR ASSESSMENT:   Malnutrition Screening Tool    ASSESSMENT:   Veronica Boyd is a 72 yo female  with PMH progressive follicular lymphoma status post fifth chop cycle 1 week ago, who presents with neutropenic fever, sepsis,  Spoke with patient at bedside. Recently received her fifth CHOP cycle, daughter states after each cycle she often has poor appetite for 3-5 days. Has been eating poorly for the past 5 days per daughter, not really eating anything. Had 2 chicken nuggets yesterday that was it. Normally will eat a sausage biscuit and 1/2 a banana in the morning, 1/2 a can of soup and noodles for supper. Unlikely to meet needs. She admits to recent weight loss of about 2 pounds, but weight appears to have rebounded to 100 pounds today. Did not eat any breakfast today. Discussed with her options to help her increase PO intake. She is open to consuming nuts, peach boost breeze, soup and some strawberry yogurt. Noted previous visit to cancer center RD. Off levo.   Labs reviewed:  Na 131  Medications reviewed and include:  Megace 40mg , Colace, MVI w/ Minerals NS at 183mL/hr KPhos 64mmol IV    NUTRITION - FOCUSED PHYSICAL EXAM:    Most Recent Value  Orbital Region  Severe depletion  Upper Arm Region  Severe depletion  Thoracic and Lumbar Region  Severe depletion  Buccal Region  Severe depletion  Temple Region  Severe depletion  Clavicle Bone Region  Severe depletion  Clavicle and  Acromion Bone Region  Severe depletion  Scapular Bone Region  Severe depletion  Dorsal Hand  Severe depletion  Patellar Region  Severe depletion  Anterior Thigh Region  Severe depletion  Posterior Calf Region  Severe depletion  Edema (RD Assessment)  None  Hair  Reviewed  Eyes  Reviewed  Mouth  Reviewed  Skin  Reviewed  Nails  Reviewed       Diet Order:  Diet Heart Room service appropriate? Yes; Fluid consistency: Thin  EDUCATION NEEDS:   Education needs have been addressed  Skin:  Skin Assessment: Reviewed RN Assessment  Last BM:  12/20/2017  Height:   Ht Readings from Last 1 Encounters:  12/19/17 4\' 11"  (1.499 m)    Weight:   Wt Readings from Last 1 Encounters:  12/20/17 100 lb 6.4 oz (45.5 kg)    Ideal Body Weight:  44.7 kg  BMI:  Body mass index is 20.28 kg/m.  Estimated Nutritional Needs:   Kcal:  1363-1600 calories (30-35 cal/kg)  Protein:  68-77 grams (1.5-1.7g/kg)  Fluid:  >1.5L  Satira Anis. Zafira Munos, MS, RD LDN Inpatient Clinical Dietitian Pager [phone]

## 2017-12-21 DIAGNOSIS — A4151 Sepsis due to Escherichia coli [E. coli]: Principal | ICD-10-CM

## 2017-12-21 DIAGNOSIS — E876 Hypokalemia: Secondary | ICD-10-CM

## 2017-12-21 LAB — CBC WITH DIFFERENTIAL/PLATELET
Basophils Absolute: 0 10*3/uL (ref 0–0.1)
Basophils Relative: 1 %
Eosinophils Absolute: 0 10*3/uL (ref 0–0.7)
Eosinophils Relative: 1 %
HEMATOCRIT: 25.4 % — AB (ref 35.0–47.0)
HEMOGLOBIN: 8.8 g/dL — AB (ref 12.0–16.0)
LYMPHS ABS: 0 10*3/uL — AB (ref 1.0–3.6)
Lymphocytes Relative: 22 %
MCH: 31.4 pg (ref 26.0–34.0)
MCHC: 34.8 g/dL (ref 32.0–36.0)
MCV: 90.1 fL (ref 80.0–100.0)
MONO ABS: 0.1 10*3/uL — AB (ref 0.2–0.9)
MONOS PCT: 60 %
NEUTROS PCT: 16 %
Neutro Abs: 0 10*3/uL — ABNORMAL LOW (ref 1.4–6.5)
Platelets: 21 10*3/uL — CL (ref 150–440)
RBC: 2.82 MIL/uL — ABNORMAL LOW (ref 3.80–5.20)
RDW: 23.4 % — AB (ref 11.5–14.5)
WBC: 0.2 10*3/uL — CL (ref 3.6–11.0)

## 2017-12-21 LAB — BASIC METABOLIC PANEL
Anion gap: 9 (ref 5–15)
CHLORIDE: 103 mmol/L (ref 101–111)
CO2: 21 mmol/L — AB (ref 22–32)
CREATININE: 0.38 mg/dL — AB (ref 0.44–1.00)
Calcium: 7.6 mg/dL — ABNORMAL LOW (ref 8.9–10.3)
GFR calc Af Amer: >60 mL/min (ref >60)
GFR calc non Af Amer: >60 mL/min (ref >60)
Glucose, Bld: 95 mg/dL (ref 65–99)
Potassium: 3.5 mmol/L (ref 3.5–5.1)
Sodium: 133 mmol/L — ABNORMAL LOW (ref 135–145)

## 2017-12-21 LAB — BPAM RBC
Blood Product Expiration Date: 201902182359
Blood Product Expiration Date: 201902212359
ISSUE DATE / TIME: 201902150142
ISSUE DATE / TIME: 201902150412
UNIT TYPE AND RH: 9500
Unit Type and Rh: 9500

## 2017-12-21 LAB — MAGNESIUM
Magnesium: 1.7 mg/dL (ref 1.7–2.4)
Magnesium: 1.9 mg/dL (ref 1.7–2.4)

## 2017-12-21 LAB — URINE CULTURE

## 2017-12-21 LAB — BPAM PLATELET PHERESIS
Blood Product Expiration Date: 201902162359
ISSUE DATE / TIME: 201902150526
Unit Type and Rh: 5100

## 2017-12-21 LAB — COMPREHENSIVE METABOLIC PANEL
ALK PHOS: 33 U/L — AB (ref 38–126)
ALT: 10 U/L — AB (ref 14–54)
AST: 15 U/L (ref 15–41)
Albumin: 2.4 g/dL — ABNORMAL LOW (ref 3.5–5.0)
Anion gap: 8 (ref 5–15)
BILIRUBIN TOTAL: 1.8 mg/dL — AB (ref 0.3–1.2)
BUN: 7 mg/dL (ref 6–20)
CALCIUM: 7.6 mg/dL — AB (ref 8.9–10.3)
CO2: 22 mmol/L (ref 22–32)
CREATININE: 0.35 mg/dL — AB (ref 0.44–1.00)
Chloride: 101 mmol/L (ref 101–111)
GFR calc Af Amer: >60 mL/min (ref >60)
GFR calc non Af Amer: >60 mL/min (ref >60)
GLUCOSE: 88 mg/dL (ref 65–99)
POTASSIUM: 2.4 mmol/L — AB (ref 3.5–5.1)
Sodium: 131 mmol/L — ABNORMAL LOW (ref 135–145)
TOTAL PROTEIN: 4.8 g/dL — AB (ref 6.5–8.1)

## 2017-12-21 LAB — TROPONIN I
TROPONIN I: 0.05 ng/mL — AB (ref <0.03)
Troponin I: 0.06 ng/mL (ref <0.03)
Troponin I: 0.07 ng/mL (ref <0.03)

## 2017-12-21 LAB — CBC
HEMATOCRIT: 27.2 % — AB (ref 35.0–47.0)
HEMOGLOBIN: 9.7 g/dL — AB (ref 12.0–16.0)
MCH: 31.9 pg (ref 26.0–34.0)
MCHC: 35.6 g/dL (ref 32.0–36.0)
MCV: 89.5 fL (ref 80.0–100.0)
Platelets: 27 10*3/uL — CL (ref 150–440)
RBC: 3.04 MIL/uL — AB (ref 3.80–5.20)
RDW: 23.7 % — ABNORMAL HIGH (ref 11.5–14.5)
WBC: 0.1 10*3/uL — AB (ref 3.6–11.0)

## 2017-12-21 LAB — TYPE AND SCREEN
ABO/RH(D): O NEG
Antibody Screen: NEGATIVE
UNIT DIVISION: 0
Unit division: 0

## 2017-12-21 LAB — LACTIC ACID, PLASMA: LACTIC ACID, VENOUS: 1 mmol/L (ref 0.5–1.9)

## 2017-12-21 LAB — PREPARE PLATELET PHERESIS: UNIT DIVISION: 0

## 2017-12-21 LAB — PROTIME-INR
INR: 0.99
Prothrombin Time: 13 seconds (ref 11.4–15.2)

## 2017-12-21 LAB — C DIFFICILE QUICK SCREEN W PCR REFLEX
C DIFFICLE (CDIFF) ANTIGEN: NEGATIVE
C Diff interpretation: NOT DETECTED
C Diff toxin: NEGATIVE

## 2017-12-21 LAB — GLUCOSE, CAPILLARY: Glucose-Capillary: 105 mg/dL — ABNORMAL HIGH (ref 65–99)

## 2017-12-21 LAB — PHOSPHORUS
Phosphorus: 1.7 mg/dL — ABNORMAL LOW (ref 2.5–4.6)
Phosphorus: 2.6 mg/dL (ref 2.5–4.6)

## 2017-12-21 LAB — POTASSIUM: POTASSIUM: 3.1 mmol/L — AB (ref 3.5–5.1)

## 2017-12-21 MED ORDER — SODIUM CHLORIDE 0.9 % IV BOLUS (SEPSIS)
1000.0000 mL | INTRAVENOUS | Status: AC
  Administered 2017-12-21 (×2): 1000 mL via INTRAVENOUS

## 2017-12-21 MED ORDER — MAGNESIUM SULFATE IN D5W 1-5 GM/100ML-% IV SOLN
1.0000 g | Freq: Once | INTRAVENOUS | Status: AC
  Administered 2017-12-21: 1 g via INTRAVENOUS
  Filled 2017-12-21: qty 100

## 2017-12-21 MED ORDER — POTASSIUM PHOSPHATES 15 MMOLE/5ML IV SOLN
30.0000 mmol | Freq: Once | INTRAVENOUS | Status: AC
  Administered 2017-12-21: 30 mmol via INTRAVENOUS
  Filled 2017-12-21: qty 10

## 2017-12-21 MED ORDER — METRONIDAZOLE IN NACL 5-0.79 MG/ML-% IV SOLN
500.0000 mg | Freq: Three times a day (TID) | INTRAVENOUS | Status: DC
Start: 2017-12-21 — End: 2017-12-22
  Administered 2017-12-21 – 2017-12-22 (×5): 500 mg via INTRAVENOUS
  Filled 2017-12-21 (×7): qty 100

## 2017-12-21 MED ORDER — POTASSIUM CHLORIDE IN NACL 20-0.9 MEQ/L-% IV SOLN
INTRAVENOUS | Status: DC
  Administered 2017-12-21: 17:00:00 via INTRAVENOUS
  Filled 2017-12-21 (×2): qty 1000

## 2017-12-21 MED ORDER — ZINC OXIDE 40 % EX OINT
TOPICAL_OINTMENT | CUTANEOUS | Status: DC | PRN
  Filled 2017-12-21: qty 114

## 2017-12-21 MED ORDER — BOOST / RESOURCE BREEZE PO LIQD CUSTOM
1.0000 | Freq: Three times a day (TID) | ORAL | Status: DC
  Administered 2017-12-21: 1 mL via ORAL
  Administered 2017-12-21 – 2017-12-25 (×10): 1 via ORAL
  Administered 2017-12-26: 10:00:00 via ORAL
  Administered 2017-12-26 – 2017-12-27 (×3): 1 via ORAL

## 2017-12-21 MED ORDER — POTASSIUM CHLORIDE 10 MEQ/100ML IV SOLN
10.0000 meq | INTRAVENOUS | Status: AC
  Administered 2017-12-21 (×5): 10 meq via INTRAVENOUS
  Filled 2017-12-21 (×6): qty 100

## 2017-12-21 MED ORDER — METOPROLOL TARTRATE 5 MG/5ML IV SOLN
2.5000 mg | INTRAVENOUS | Status: DC | PRN
  Administered 2017-12-21 – 2017-12-22 (×2): 2.5 mg via INTRAVENOUS
  Filled 2017-12-21: qty 5

## 2017-12-21 NOTE — Progress Notes (Signed)
Hematology/Oncology Consult note [facility]  Telephone:([phone] [phone]  Patient Care Team: Glendon Axe, MD as PCP - General (Internal Medicine) Leonie Green, MD as Referring Physician (Surgery)   Name of the patient: Veronica Boyd  [id number]  Mar 11, 1946   Date of visit: 12/21/2017   Interval history- feels better today. Nausea is under control. Diarrhea is getting better. She has been afebrile in the last 24 hours. Denies any pain   Review of systems- Review of Systems  Constitutional: Positive for malaise/fatigue. Negative for chills, fever and weight loss.  HENT: Negative for congestion, ear discharge and nosebleeds.   Eyes: Negative for blurred vision.  Respiratory: Negative for cough, hemoptysis, sputum production, shortness of breath and wheezing.   Cardiovascular: Negative for chest pain, palpitations, orthopnea and claudication.  Gastrointestinal: Positive for nausea. Negative for abdominal pain, blood in stool, constipation, diarrhea, heartburn, melena and vomiting.  Genitourinary: Negative for dysuria, flank pain, frequency, hematuria and urgency.  Musculoskeletal: Negative for back pain, joint pain and myalgias.  Skin: Negative for rash.  Neurological: Positive for weakness. Negative for dizziness, tingling, focal weakness, seizures and headaches.  Endo/Heme/Allergies: Does not bruise/bleed easily.  Psychiatric/Behavioral: Negative for depression and suicidal ideas. The patient does not have insomnia.       No Known Allergies   Past Medical History:  Diagnosis Date  . Arthritis   . Cataract    bilateral  . Chicken pox   . Colon polyp   . 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 Hyperlipidemia   . Osteoporosis      Past Surgical History:  Procedure Laterality Date  . AXILLARY LYMPH NODE DISSECTION Right 08/21/2016   Procedure: AXILLARY LYMPH NODE excision;  Surgeon: Leonie Green,  MD;  Location: [facility] ORS;  Service: General;  Laterality: Right;  . CATARACT EXTRACTION, BILATERAL Bilateral   . JOINT REPLACEMENT Left   . PORTA CATH INSERTION N/A 09/16/2017   Procedure: PORTA CATH INSERTION;  Surgeon: Algernon Huxley, MD;  Location: [place] [facility];  Service: Cardiovascular;  Laterality: N/A;  . TONSILLECTOMY    . TOTAL HIP ARTHROPLASTY Left 1992    Social History   Socioeconomic History  . Marital status: Married    Spouse name: Not on file  . Number of children: Not on file  . Years of education: Not on file  . Highest education level: Not on file  Social Needs  . Financial resource strain: Not on file  . Food insecurity - worry: Not on file  . Food insecurity - inability: Not on file  . Transportation needs - medical: Not on file  . Transportation needs - non-medical: Not on file  Occupational History  . Not on file  Tobacco Use  . Smoking status: Never Smoker  . Smokeless tobacco: Never Used  Substance and Sexual Activity  . Alcohol use: No  . Drug use: No  . Sexual activity: Not on file  Other Topics Concern  . Not on file  Social History Narrative  . Not on file    Family History  Problem Relation Age of Onset  . Diabetes Sister   . Lung cancer Brother   . Diabetes Brother   . Basal cell carcinoma Daughter   . Breast cancer Paternal Aunt      Current Facility-Administered Medications:  .  0.9 %  sodium chloride infusion, , Intravenous, Continuous, Amelia Jo, MD, Last Rate: 100 mL/hr at 12/21/17  0900 .  acetaminophen (TYLENOL) tablet 650 mg, 650 mg, Oral, Q6H PRN **OR** acetaminophen (TYLENOL) suppository 650 mg, 650 mg, Rectal, Q6H PRN, Amelia Jo, MD .  albuterol (PROVENTIL) (2.5 MG/3ML) 0.083% nebulizer solution 2.5 mg, 2.5 mg, Inhalation, Q6H PRN, Amelia Jo, MD .  bisacodyl (DULCOLAX) EC tablet 5 mg, 5 mg, Oral, Daily PRN, Amelia Jo, MD .  docusate sodium (COLACE) capsule 100 mg, 100 mg, Oral, BID, Amelia Jo, MD,  100 mg at 12/20/17 0038 .  dronabinol (MARINOL) capsule 5 mg, 5 mg, Oral, BID AC, Lafayette Dragon, MD, 5 mg at 12/20/17 2351 .  feeding supplement (BOOST / RESOURCE BREEZE) liquid 1 Container, 1 Container, Oral, TID BM, Wieting, Richard, MD, 1 mL at 12/21/17 1021 .  HYDROcodone-acetaminophen (NORCO/VICODIN) 5-325 MG per tablet 1-2 tablet, 1-2 tablet, Oral, Q4H PRN, Amelia Jo, MD, 1 tablet at 12/20/17 [phone] .  meropenem (MERREM) 1 g in sodium chloride 0.9 % 100 mL IVPB, 1 g, Intravenous, Q12H, Leonel Ramsay, MD, Last Rate: 200 mL/hr at 12/21/17 1022, 1 g at 12/21/17 1022 .  metoCLOPramide (REGLAN) injection 10 mg, 10 mg, Intravenous, Q6H, Lafayette Dragon, MD, 10 mg at 12/21/17 [phone] .  metroNIDAZOLE (FLAGYL) IVPB 500 mg, 500 mg, Intravenous, Q8H, Tukov, Magadalene S, NP, Stopped at 12/21/17 0930 .  multivitamin with minerals tablet 1 tablet, 1 tablet, Oral, Daily, Amelia Jo, MD .  ondansetron [facility]) tablet 4 mg, 4 mg, Oral, Q6H PRN **OR** ondansetron (ZOFRAN) injection 4 mg, 4 mg, Intravenous, Q6H PRN, Amelia Jo, MD, 4 mg at 12/20/17 2359 .  pantoprazole (PROTONIX) injection 40 mg, 40 mg, Intravenous, Q24H, Tukov, Magadalene S, NP, 40 mg at 12/21/17 0028 .  prochlorperazine (COMPAZINE) tablet 10 mg, 10 mg, Oral, Q6H PRN, Amelia Jo, MD .  promethazine (PHENERGAN) injection 6.25 mg, 6.25 mg, Intravenous, Q6H PRN, Lafayette Dragon, MD, 6.25 mg at 12/20/17 1550 .  simvastatin (ZOCOR) tablet 20 mg, 20 mg, Oral, QHS, Amelia Jo, MD, 20 mg at 12/19/17 2300 .  traZODone (DESYREL) tablet 25 mg, 25 mg, Oral, QHS PRN, Amelia Jo, MD .  vancomycin (VANCOCIN) IVPB 750 mg/150 ml premix, 750 mg, Intravenous, Q24H, Amelia Jo, MD, Stopped at 12/20/17 2238  Facility-Administered Medications Ordered in Other Encounters:  .  heparin lock flush 100 unit/mL, 500 Units, Intravenous, Once, Finnegan, Kathlene November, MD .  sodium chloride flush (NS) 0.9 % injection 10 mL, 10 mL, Intravenous,  PRN, Lloyd Huger, MD, 10 mL at 12/04/17 0901 .  yttrium-90 injection 61.4 millicurie, 43.1 millicurie, Intravenous, Once, [name], MD  Physical exam:  Vitals:   12/21/17 1230 12/21/17 1300 12/21/17 1330 12/21/17 1400  BP: (!) 147/81 117/75 110/69 106/61  Pulse: 92 100 (!) 103 99  Resp: (!) 22 20 (!) 28 (!) 25  Temp:      TempSrc:      SpO2: 95% 96% 95% 96%  Weight:      Height:       Physical Exam  Constitutional: She is oriented to person, place, and time.  Thin. Appears more alert today. She is smiling but still fatigued. Overall improved  HENT:  Head: Normocephalic and atraumatic.  Eyes: EOM are normal. Pupils are equal, round, and reactive to light.  Neck: Normal range of motion.  Cardiovascular: Normal rate, regular rhythm and normal heart sounds.  Pulmonary/Chest: Effort normal and breath sounds normal.  Abdominal: Soft. Bowel sounds are normal. She exhibits no distension. There is no tenderness.  Neurological: She is  alert and oriented to person, place, and time.  Skin: Skin is warm and dry.     CMP Latest Ref Rng & Units 12/21/2017  Glucose 65 - 99 mg/dL -  BUN 6 - 20 mg/dL -  Creatinine 0.44 - 1.00 mg/dL -  Sodium 135 - 145 mmol/L -  Potassium 3.5 - 5.1 mmol/L 3.1(L)  Chloride 101 - 111 mmol/L -  CO2 22 - 32 mmol/L -  Calcium 8.9 - 10.3 mg/dL -  Total Protein 6.5 - 8.1 g/dL -  Total Bilirubin 0.3 - 1.2 mg/dL -  Alkaline Phos 38 - 126 U/L -  AST 15 - 41 U/L -  ALT 14 - 54 U/L -   CBC Latest Ref Rng & Units 12/21/2017  WBC 3.6 - 11.0 K/uL 0.2(LL)  Hemoglobin 12.0 - 16.0 g/dL 8.8(L)  Hematocrit 35.0 - 47.0 % 25.4(L)  Platelets 150 - 440 K/uL 21(LL)    @IMAGES @  Ct Soft Tissue Neck W Contrast  Result Date: 12/09/2017 CLINICAL DATA:  72 y/o F; restaging of follicular lymphoma diagnosed 10/17 currently receiving chemotherapy. EXAM: CT NECK WITH CONTRAST TECHNIQUE: Multidetector CT imaging of the neck was performed using the standard protocol  following the bolus administration of intravenous contrast. CONTRAST:  58mL ISOVUE-300 IOPAMIDOL (ISOVUE-300) INJECTION 61% COMPARISON:  Concurrent CT of chest abdomen and pelvis. 09/12/2017 CT of the neck. 04/19/2017, 11/07/2016, 08/01/2016 PET-CT. FINDINGS: Pharynx and larynx: Normal. No mass or swelling. Salivary glands: No inflammation, mass, or stone. Thyroid: Normal. Lymph nodes: No lymphadenopathy of the neck by imaging criteria. Partially visualized bulky mass in the right axilla, correlation with concurrent CT of chest findings recommended. Vascular: Mild calcific atherosclerosis of aortic arch and left carotid bifurcation without significant stenosis. Limited intracranial: Negative. Visualized orbits: Negative. Mastoids and visualized paranasal sinuses: Clear. Skeleton: Moderate cervical spondylosis with discogenic degenerative changes greatest at the C4-C6 levels and C3-4 minimal grade 1 anterolisthesis. No acute osseous abnormality identified. Upper chest: Stable scarring in right lung apex. Other: None. IMPRESSION: 1. Decreased size of neck lymph nodes. No residual lymphadenopathy of the neck. 2. Partially visualize bulky mass in right axilla, correlation with concurrent CT of chest findings recommended. Electronically Signed   By: Kristine Garbe M.D.   On: 12/09/2017 15:53   Ct Chest W Contrast  Result Date: 12/09/2017 CLINICAL DATA:  Followup lymphoma. EXAM: CT CHEST, ABDOMEN, AND PELVIS WITH CONTRAST TECHNIQUE: Multidetector CT imaging of the chest, abdomen and pelvis was performed following the standard protocol during bolus administration of intravenous contrast. CONTRAST:  18mL ISOVUE-300 IOPAMIDOL (ISOVUE-300) INJECTION 61% COMPARISON:  09/12/2017 FINDINGS: CT CHEST FINDINGS Cardiovascular: Normal heart size. No pericardial effusion identified. Aortic atherosclerosis. Mediastinum/Nodes: Normal thyroid gland. The trachea appears patent and midline. Normal appearance of the esophagus.  Previous enlarged right paratracheal node measures 0.5 cm, image 17 of series 4. Previously 1.8 cm. There has been resolution of previous anterior mediastinal prevascular adenopathy. The index left internal mammary lymph node measures 0.7 cm, image 21 of series 4. Previously 1.6 cm. Index left axillary node has resolved in the interval. Previously noted left breast lesions have resolved in the interval. The large round axillary mass persists measuring 4.1 x 4.3 x 5.4 cm (volume = 50 cm^3). Previously 4.4 x 3.6 x 5.1 cm (volume = 42 cm^3). Lungs/Pleura: There has been resolution of previous right pleural effusion. Resolution of previous right-sided paraspinal soft tissue overlying the right upper lobe. Previous pleural nodularity overlying the right lung has resolved in the interval. Musculoskeletal: Degenerative  disc disease noted within the thoracic spine. No suspicious bone lesions. CT ABDOMEN PELVIS FINDINGS Hepatobiliary: No focal liver abnormality is seen. No gallstones, gallbladder wall thickening, or biliary dilatation. Pancreas: Unremarkable. No pancreatic ductal dilatation or surrounding inflammatory changes. Spleen: Normal in size without focal abnormality. Adrenals/Urinary Tract: The adrenal glands are normal. Unremarkable appearance of both kidneys. The urinary bladder appears unremarkable. Stomach/Bowel: The stomach is normal. The small bowel loops have a normal course and caliber without obstruction. Normal appearance of the colon. Vascular/Lymphatic: Aortic atherosclerosis without aneurysm. No adenopathy within the abdomen. No pelvic or inguinal adenopathy identified. Reproductive: The uterus is unremarkable.  No adnexal mass. Other: No free fluid or fluid collections identified. No peritoneal nodularity. Musculoskeletal: Previous left hip arthroplasty. There is a lucent lesion involving the left acetabulum measuring 3.6 by 2.3 cm. 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  IMPRESSION: 1. Continued interval response to therapy. When compared with 09/12/2017 there has been resolution of mediastinal, left axillary and left internal mammary adenopathy. The lesions within the right breast have also resolved in the interval. Resolution of right-sided pleural nodularity. 2. There is a persistent enlarged soft tissue mass within the right axilla which is slightly increased in volume compared with the previous exam and worrisome for residual tumor. Electronically Signed   By: Kerby Moors M.D.   On: 12/09/2017 16:58   Ct Abdomen Pelvis W Contrast  Result Date: 12/20/2017 CLINICAL DATA:  Decreased appetite.  Nausea. EXAM: CT ABDOMEN AND PELVIS WITH CONTRAST TECHNIQUE: Multidetector CT imaging of the abdomen and pelvis was performed using the standard protocol following bolus administration of intravenous contrast. CONTRAST:  19mL ISOVUE-300 IOPAMIDOL (ISOVUE-300) INJECTION 61% COMPARISON:  December 09, 2017 FINDINGS: Lower chest: New pleural effusions are identified, right greater than left, relatively small. Associated atelectasis is noted. A pectus deformity is noted in the lower chest, unchanged. No nodules, masses, or suspicious infiltrates are identified in the lower chest. Hepatobiliary: The liver is normal in appearance. The gallbladder is more distended in the interval with no obvious stones or definitive wall thickening. There is a small amount of fluid adjacent to the gallbladder which is nonspecific given mild ascites elsewhere which will be described below. The portal vein is patent. Pancreas: Unremarkable. No pancreatic ductal dilatation or surrounding inflammatory changes. Spleen: Normal in size without focal abnormality. Adrenals/Urinary Tract: Adrenal glands are normal. The kidneys, ureters, and bladder are normal. Stomach/Bowel: The stomach appears thick walled but is poorly evaluated due to lack of distention. There was no wall thickening on the December 09, 2017 study.  The small bowel is normal. There appears to be rectal wall thickening as well such as on series 2, image 68 and coronal image 52. No adjacent fat stranding identified. Scattered colonic diverticuli without diverticulitis. The remainder of the colon is otherwise normal. The appendix is normal in appearance and best seen on coronal imaging. Vascular/Lymphatic: Atherosclerotic changes is seen in the nonaneurysmal aorta. No adenopathy. Reproductive: Uterus and bilateral adnexa are unremarkable. Other: Ascites in the abdomen adjacent to the liver, spleen, gallbladder, and in the pericolic gutters and pelvis. Musculoskeletal: No acute or significant osseous findings. IMPRESSION: 1. The stomach wall appears thickened. This could be due to poor distention but gastritis is not excluded. Recommend clinical correlation. 2. The rectal wall appears thickened as well, new in the interval, suggesting proctitis. Recommend clinical correlation. 3. New bilateral pleural effusions and ascites. 4. The gallbladder is mildly distended but there is no definitive cholelithiasis or wall  thickening. The fluid adjacent to the gallbladder is likely due to the ascites. An ultrasound could better evaluate if warranted. 5. Electronically Signed   By: Dorise Bullion III M.D   On: 12/20/2017 20:07   Ct Abdomen Pelvis W Contrast  Result Date: 12/09/2017 CLINICAL DATA:  Followup lymphoma. EXAM: CT CHEST, ABDOMEN, AND PELVIS WITH CONTRAST TECHNIQUE: Multidetector CT imaging of the chest, abdomen and pelvis was performed following the standard protocol during bolus administration of intravenous contrast. CONTRAST:  58mL ISOVUE-300 IOPAMIDOL (ISOVUE-300) INJECTION 61% COMPARISON:  09/12/2017 FINDINGS: CT CHEST FINDINGS Cardiovascular: Normal heart size. No pericardial effusion identified. Aortic atherosclerosis. Mediastinum/Nodes: Normal thyroid gland. The trachea appears patent and midline. Normal appearance of the esophagus. Previous enlarged  right paratracheal node measures 0.5 cm, image 17 of series 4. Previously 1.8 cm. There has been resolution of previous anterior mediastinal prevascular adenopathy. The index left internal mammary lymph node measures 0.7 cm, image 21 of series 4. Previously 1.6 cm. Index left axillary node has resolved in the interval. Previously noted left breast lesions have resolved in the interval. The large round axillary mass persists measuring 4.1 x 4.3 x 5.4 cm (volume = 50 cm^3). Previously 4.4 x 3.6 x 5.1 cm (volume = 42 cm^3). Lungs/Pleura: There has been resolution of previous right pleural effusion. Resolution of previous right-sided paraspinal soft tissue overlying the right upper lobe. Previous pleural nodularity overlying the right lung has resolved in the interval. 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 No suspicious bone lesions. CT ABDOMEN PELVIS FINDINGS Hepatobiliary: No focal liver abnormality is seen. No gallstones, gallbladder wall thickening, or biliary dilatation. Pancreas: Unremarkable. No pancreatic ductal dilatation or surrounding inflammatory changes. Spleen: Normal in size without focal abnormality. Adrenals/Urinary Tract: The adrenal glands are normal. Unremarkable appearance of both kidneys. The urinary bladder appears unremarkable. Stomach/Bowel: The stomach is normal. The small bowel loops have a normal course and caliber without obstruction. Normal appearance of the colon. Vascular/Lymphatic: Aortic atherosclerosis without aneurysm. No adenopathy within the abdomen. No pelvic or inguinal adenopathy identified. Reproductive: The uterus is unremarkable.  No adnexal mass. Other: No free fluid or fluid collections identified. No peritoneal nodularity. Musculoskeletal: Previous left hip arthroplasty. There is a lucent lesion involving the left acetabulum measuring 3.6 by 2.3 cm. 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 IMPRESSION: 1.  Continued interval response to therapy. When compared with 09/12/2017 there has been resolution of mediastinal, left axillary and left internal mammary adenopathy. The lesions within the right breast have also resolved in the interval. Resolution of right-sided pleural nodularity. 2. There is a persistent enlarged soft tissue mass within the right axilla which is slightly increased in volume compared with the previous exam and worrisome for residual tumor. Electronically Signed   By: Kerby Moors M.D.   On: 12/09/2017 16:58   Dg Chest Port 1 View  Result Date: 12/19/2017 CLINICAL DATA:  Fever.  History of lymphoma. EXAM: PORTABLE CHEST 1 VIEW COMPARISON:  Chest CT 12/09/2017 and chest x-ray 11/18/2017 FINDINGS: The right IJ Port-A-Cath is stable. The heart is normal in size. Stable tortuosity and calcification of the thoracic aorta. The lungs are clear. No pulmonary lesions or pleural effusion. Stable hazy right upper lobe scarring type changes. The bony thorax is intact. IMPRESSION: No acute pulmonary findings. Stable right upper lobe scarring type changes. Electronically Signed   By: Marijo Sanes M.D.   On: 12/19/2017 19:31     Assessment and plan- Patient is a 72 y.o.  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 Source unclear. CT abdomen done yesterday reviewed by me independently. She does have some evidence of proctitis but no colitis. Await further ID input. She remains febrile since admission. Lactic acid level has normalized and she is no longer hypotensive indicating clinical improvement.repeat blood cultures ordered and are pending. Overall she is improving clinically.  Pancytopenia- Wbc up from 0.1 to 0.2. ANC still 0 but monocytes noted in the differential which may indicate bone marrow recovery. Hold off on neupogen. Hb  at baseline. Thrombocytopenia due to cemotherapy. No need for transfusion unless H/H <7/21 or platelet count <10  Diarrhea- improving. C diff negative. Likely due to proctitis. Please check stool enteric pathogens as well  Nausea- possibly secondary to chemotherapy and ongoing infection. Currently on marinol. Symptoms well controlled  Will continue to follow   Visit Diagnosis 1. Sepsis, due to unspecified organism ([place])   2. Neutropenic fever ([place])   3. Thrombocytopenia ([place])   4. Anemia, unspecified type      Dr. Randa Evens, MD, MPH [facility] at [facility] Pager- [phone] 12/21/2017 4:22 PM

## 2017-12-21 NOTE — Progress Notes (Signed)
Patient ID: Veronica Boyd, female   DOB: 11-21-1945, 72 y.o.   MRN: [id number]  [facility] PROGRESS NOTE  Veronica Boyd:[id number] DOB: 1946/04/14 DOA: 12/19/2017 PCP: Veronica Axe, MD  HPI/Subjective: Patient feeling a little bit better today.  Yesterday had a lot of nausea and vomiting.  Also had some diarrhea.  Objective: Vitals:   12/21/17 1230 12/21/17 1300  BP: (!) 147/81 117/75  Pulse: 92 100  Resp: (!) 22 20  Temp:    SpO2: 95% 96%    Filed Weights   12/19/17 1839 12/20/17 0500  Weight: 42.6 kg (94 lb) 45.5 kg (100 lb 6.4 oz)    ROS: Review of Systems  Constitutional: Negative for chills and fever.  Eyes: Negative for blurred vision.  Respiratory: Negative for cough and shortness of breath.   Cardiovascular: Negative for chest pain.  Gastrointestinal: Positive for abdominal pain and diarrhea. Negative for constipation, nausea and vomiting.  Genitourinary: Negative for dysuria.  Musculoskeletal: Negative for joint pain.  Neurological: Negative for dizziness and headaches.   Exam: Physical Exam  Constitutional: She is oriented to person, place, and time.  HENT:  Nose: No mucosal edema.  Mouth/Throat: No oropharyngeal exudate or posterior oropharyngeal edema.  Eyes: Conjunctivae, EOM and lids are normal. Pupils are equal, round, and reactive to light.  Neck: No JVD present. Carotid bruit is not present. No edema present. No thyroid mass and no thyromegaly present.  Cardiovascular: S1 normal and S2 normal. Exam reveals no gallop.  No murmur heard. Pulses:      Dorsalis pedis pulses are 2+ on the right side, and 2+ on the left side.  Respiratory: No respiratory distress. She has decreased breath sounds in the right lower field and the left lower field. She has no wheezes. She has no rhonchi. She has no rales.  GI: Soft. Bowel sounds are normal. She exhibits fluid wave. There is no tenderness.  Musculoskeletal:       Right ankle: She exhibits no  swelling.       Left ankle: She exhibits no swelling.  Lymphadenopathy:    She has no cervical adenopathy.  Neurological: She is alert and oriented to person, place, and time. No cranial nerve deficit.  Skin: Skin is warm. No rash noted. Nails show no clubbing.  Psychiatric: She has a normal mood and affect.      Data Reviewed: Basic Metabolic Panel: Recent Labs  Lab 12/19/17 1910 12/19/17 2354 12/20/17 0923 12/20/17 2348 12/21/17 0549  NA 130* 135 131* 131* 133*  K 2.9* 2.8* 3.5 2.4* 3.5  CL 96* 106 102 101 103  CO2 22 22 22 22  21*  GLUCOSE 118* 119* 140* 88 95  BUN 13 10 12 7  <5*  CREATININE 0.83 0.67 0.49 0.35* 0.38*  CALCIUM 8.5* 7.0* 7.8* 7.6* 7.6*  MG  --  1.4* 2.3 1.7 1.9  PHOS  --  2.4* 3.6 1.7* 2.6   Liver Function Tests: Recent Labs  Lab 12/19/17 1910 12/20/17 2348  AST 20 15  ALT 11* 10*  ALKPHOS 34* 33*  BILITOT 0.8 1.8*  PROT 5.7* 4.8*  ALBUMIN 2.9* 2.4*    CBC: Recent Labs  Lab 12/19/17 1910 12/20/17 0923 12/20/17 2348 12/21/17 0549  WBC 0.1* 0.1* 0.1* 0.2*  NEUTROABS 0.0*  --   --  0.0*  HGB 7.0* 8.4* 9.7* 8.8*  HCT 20.7* 24.9* 27.2* 25.4*  MCV 102.9* 89.8 89.5 90.1  PLT 6* 47* 27* 21*   Cardiac Enzymes: Recent Labs  Lab 12/20/17 2348 12/21/17 0549  TROPONINI 0.07* 0.06*    CBG: Recent Labs  Lab 12/19/17 2307 12/20/17 0912 12/21/17 0723  GLUCAP 103* 135* 105*    Recent Results (from the past 240 hour(s))  Blood Culture (routine x 2)     Status: Abnormal (Preliminary result)   Collection Time: 12/19/17  7:10 PM  Result Value Ref Range Status   Specimen Description   Final    BLOOD LEFT WRIST Performed at [facility], [address] [place], [place] [address]    Special Requests   Final    BOTTLES DRAWN AEROBIC AND ANAEROBIC Blood Culture adequate volume Performed at [facility], [address], [place], [place] [address]    Culture  Setup Time   Final    GRAM NEGATIVE RODS IN BOTH AEROBIC AND  ANAEROBIC BOTTLES CRITICAL RESULT CALLED TO, READ BACK BY AND VERIFIED WITH: Veronica Boyd AT 5809 ON 12/20/17 [place].    Culture (A)  Final    ESCHERICHIA COLI SUSCEPTIBILITIES TO FOLLOW Performed at [facility], [place] [address], [place], [place] [address]    Report Status PENDING  Incomplete  Blood Culture ID Panel (Reflexed)     Status: Abnormal   Collection Time: 12/19/17  7:10 PM  Result Value Ref Range Status   Enterococcus species NOT DETECTED NOT DETECTED Final   Listeria monocytogenes NOT DETECTED NOT DETECTED Final   Staphylococcus species NOT DETECTED NOT DETECTED Final   Staphylococcus aureus NOT DETECTED NOT DETECTED Final   Streptococcus species NOT DETECTED NOT DETECTED Final   Streptococcus agalactiae NOT DETECTED NOT DETECTED Final   Streptococcus pneumoniae NOT DETECTED NOT DETECTED Final   Streptococcus pyogenes NOT DETECTED NOT DETECTED Final   Acinetobacter baumannii NOT DETECTED NOT DETECTED Final   Enterobacteriaceae species DETECTED (A) NOT DETECTED Final    Comment: Enterobacteriaceae represent a large family of gram-negative bacteria, not a single organism. CRITICAL RESULT CALLED TO, READ BACK BY AND VERIFIED WITH: Veronica Boyd AT 2505 ON 12/20/17 [place].    Enterobacter cloacae complex NOT DETECTED NOT DETECTED Final   Escherichia coli DETECTED (A) NOT DETECTED Final    Comment: CRITICAL RESULT CALLED TO, READ BACK BY AND VERIFIED WITH: Veronica Boyd AT 3976 ON 12/20/17 [place].    Klebsiella oxytoca NOT DETECTED NOT DETECTED Final   Klebsiella pneumoniae NOT DETECTED NOT DETECTED Final   Proteus species NOT DETECTED NOT DETECTED Final   Serratia marcescens NOT DETECTED NOT DETECTED Final   Carbapenem resistance NOT DETECTED NOT DETECTED Final   Haemophilus influenzae NOT DETECTED NOT DETECTED Final   Neisseria meningitidis NOT DETECTED NOT DETECTED Final   Pseudomonas aeruginosa NOT DETECTED NOT DETECTED Final   Candida albicans NOT DETECTED NOT DETECTED Final    Candida glabrata NOT DETECTED NOT DETECTED Final   Candida krusei NOT DETECTED NOT DETECTED Final   Candida parapsilosis NOT DETECTED NOT DETECTED Final   Candida tropicalis NOT DETECTED NOT DETECTED Final    Comment: Performed at [facility], [address] [place], [place] [address]  Blood Culture (routine x 2)     Status: None (Preliminary result)   Collection Time: 12/19/17  7:40 PM  Result Value Ref Range Status   Specimen Description BLOOD RFOA  Final   Special Requests   Final    BOTTLES DRAWN AEROBIC AND ANAEROBIC Blood Culture adequate volume   Culture  Setup Time   Final    GRAM NEGATIVE RODS ANAEROBIC BOTTLE ONLY CRITICAL RESULT CALLED  TO, READ BACK BY AND VERIFIED WITH: Veronica Boyd AT 7353 ON 12/20/17 [place]. Performed at [facility], [address] [place], [place] [address]    Culture GRAM NEGATIVE RODS  Final   Report Status PENDING  Incomplete  Urine culture     Status: Abnormal   Collection Time: 12/19/17  7:40 PM  Result Value Ref Range Status   Specimen Description   Final    URINE, RANDOM Performed at [facility], [address] [place], [place] [address]    Special Requests   Final    NONE Performed at [facility], [address], [place], [place] [address]    Culture (A)  Final    <10,000 COLONIES/mL Performed at [facility], [place] [address] [place], [place] [address]    Report Status 12/21/2017 FINAL  Final  MRSA PCR Screening     Status: None   Collection Time: 12/19/17 11:11 PM  Result Value Ref Range Status   MRSA by PCR NEGATIVE NEGATIVE Final    Comment:        The GeneXpert MRSA Assay (FDA approved for NASAL specimens only), is one component of a comprehensive MRSA colonization surveillance program. It is not intended to diagnose MRSA infection nor to guide or monitor treatment for MRSA infections. Performed at [facility], [address] [place], [place] [address]   C  difficile quick scan w PCR reflex     Status: None   Collection Time: 12/21/17 10:24 AM  Result Value Ref Range Status   C Diff antigen NEGATIVE NEGATIVE Final   C Diff toxin NEGATIVE NEGATIVE Final   C Diff interpretation No C. difficile detected.  Final    Comment: Performed at [facility], [address] [place], [place] [address]     Studies: Ct Abdomen Pelvis W Contrast  Result Date: 12/20/2017 CLINICAL DATA:  Decreased appetite.  Nausea. EXAM: CT ABDOMEN AND PELVIS WITH CONTRAST TECHNIQUE: Multidetector CT imaging of the abdomen and pelvis was performed using the standard protocol following bolus administration of intravenous contrast. CONTRAST:  46mL ISOVUE-300 IOPAMIDOL (ISOVUE-300) INJECTION 61% COMPARISON:  December 09, 2017 FINDINGS: Lower chest: New pleural effusions are identified, right greater than left, relatively small. Associated atelectasis is noted. A pectus deformity is noted in the lower chest, unchanged. No nodules, masses, or suspicious infiltrates are identified in the lower chest. Hepatobiliary: The liver is normal in appearance. The gallbladder is more distended in the interval with no obvious stones or definitive wall thickening. There is a small amount of fluid adjacent to the gallbladder which is nonspecific given mild ascites elsewhere which will be described below. The portal vein is patent. Pancreas: Unremarkable. No pancreatic ductal dilatation or surrounding inflammatory changes. Spleen: Normal in size without focal abnormality. Adrenals/Urinary Tract: Adrenal glands are normal. The kidneys, ureters, and bladder are normal. Stomach/Bowel: The stomach appears thick walled but is poorly evaluated due to lack of distention. There was no wall thickening on the December 09, 2017 study. The small bowel is normal. There appears to be rectal wall thickening as well such as on series 2, image 68 and coronal image 52. No adjacent fat stranding identified. Scattered  colonic diverticuli without diverticulitis. The remainder of the colon is otherwise normal. The appendix is normal in appearance and best seen on coronal imaging. Vascular/Lymphatic: Atherosclerotic changes is seen in the nonaneurysmal aorta. No adenopathy. Reproductive: Uterus and bilateral adnexa are unremarkable. Other: Ascites in the abdomen adjacent to  the liver, spleen, gallbladder, and in the pericolic gutters and pelvis. Musculoskeletal: No acute or significant osseous findings. IMPRESSION: 1. The stomach wall appears thickened. This could be due to poor distention but gastritis is not excluded. Recommend clinical correlation. 2. The rectal wall appears thickened as well, new in the interval, suggesting proctitis. Recommend clinical correlation. 3. New bilateral pleural effusions and ascites. 4. The gallbladder is mildly distended but there is no definitive cholelithiasis or wall thickening. The fluid adjacent to the gallbladder is likely due to the ascites. An ultrasound could better evaluate if warranted. 5. Electronically Signed   By: Dorise Bullion III M.D   On: 12/20/2017 20:07   Dg Chest Port 1 View  Result Date: 12/19/2017 CLINICAL DATA:  Fever.  History of lymphoma. EXAM: PORTABLE CHEST 1 VIEW COMPARISON:  Chest CT 12/09/2017 and chest x-ray 11/18/2017 FINDINGS: The right IJ Port-A-Cath is stable. The heart is normal in size. Stable tortuosity and calcification of the thoracic aorta. The lungs are clear. No pulmonary lesions or pleural effusion. Stable hazy right upper lobe scarring type changes. The bony thorax is intact. IMPRESSION: No acute pulmonary findings. Stable right upper lobe scarring type changes. Electronically Signed   By: Marijo Sanes M.D.   On: 12/19/2017 19:31    Scheduled Meds: . docusate sodium  100 mg Oral BID  . dronabinol  5 mg Oral BID AC  . feeding supplement  1 Container Oral TID BM  . multivitamin with minerals  1 tablet Oral Daily  . pantoprazole (PROTONIX) IV   40 mg Intravenous Q24H  . simvastatin  20 mg Oral QHS   Continuous Infusions: . sodium chloride 100 mL/hr at 12/21/17 1300  . meropenem (MERREM) IV Stopped (12/21/17 1233)  . metronidazole Stopped (12/21/17 0930)    Assessment/Plan:  1. E. coli sepsis and neutropenic fever.  Patient on meropenem.  Source could be rectal inflammation seen on CT scan yesterday.  White blood cell count still very low at 0.2 with neutrophils being 16%. 2. Recurrent follicular lymphoma and pancytopenia.  Patient is status post platelet and packed red blood cell transfusion.  Last red blood cell count 8.8.  Last platelet count 21. 3. Nausea vomiting.  Seems a little bit better today.  PRN medications.  Patient started on Marinol 4. Hyperlipidemia unspecified on simvastatin 5. Hyponatremia slowly improving. 6. Hypokalemia and hypomagnesemia.  Replacement as per electrolyte protocol  Code Status:     Code Status Orders  (From admission, onward)        Start     Ordered   12/19/17 2244  Full code  Continuous     12/19/17 2243    Code Status History    Date Active Date Inactive Code Status Order ID Comments User Context   This patient has a current code status but no historical code status.    Advance Directive Documentation     Most Recent Value  Type of Advance Directive  Living will  Pre-existing out of facility DNR order (yellow form or pink MOST form)  No data  "MOST" Form in Place?  No data     Family Communication: Daughter at bedside Disposition Plan: To be determined  Consultants:  Critical care specialist  Oncology  Infectious disease consultation also placed but likely will not be done over the weekend.  Antibiotics:  Meropenem  Time spent: 27 minutes  [place]

## 2017-12-21 NOTE — Progress Notes (Signed)
Pharmacy Antibiotic Note  Veronica Boyd is a 72 y.o. female admitted on 12/19/2017 with sepsis.  Pharmacy  consulted for vanc  Plan: Will continue w/ vanc 750 mg IV q24h Will check a vanc trough 02/18 @ 1900 prior to 4th dose.  Merrem: 1 g IV q12 hours as well.    Height: 4\' 11"  (149.9 cm) Weight: 100 lb 6.4 oz (45.5 kg) IBW/kg (Calculated) : 43.2  Temp (24hrs), Avg:98.6 F (37 C), Min:98.2 F (36.8 C), Max:99.7 F (37.6 C)  Recent Labs  Lab 12/19/17 1910 12/19/17 2204 12/19/17 2354 12/20/17 0923 12/20/17 1835 12/20/17 2348 12/21/17 0549  WBC 0.1*  --   --  0.1*  --  0.1* 0.2*  CREATININE 0.83  --  0.67 0.49  --  0.35* 0.38*  LATICACIDVEN 2.7* 3.1*  --   --  0.9  --  1.0    Estimated Creatinine Clearance: 42.1 mL/min (A) (by C-G formula based on SCr of 0.38 mg/dL (L)).    No Known Allergies   Thank you for allowing pharmacy to be a part of this patient's care.  Larene Beach, PharmD  Clinical Pharmacist 12/21/2017

## 2017-12-21 NOTE — Progress Notes (Signed)
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  She also complained of abdominal pain and back pain.  He takes hydrocodone at home and reports very minimal relief with home dose.  Her last chemo session was 1 week ago.  She denies any other symptoms. Her ED workup showed severe hypokalemia, mild hyponatremia, lactic acidosis, severe neutropenia with a WBC of 0.1, hemoglobin of 7.0, hematocrit of 20.7, and platelets of 6000.  She is being admitted to the ICU for further management.  2/14 Admit   REVIEW OF SYSTEMS:   Still having nausea   SUBJECTIVE:  Patient reports she feels better but still discomfort from nausea.  VITAL SIGNS: BP 117/75   Pulse 100   Temp 98.1 F (36.7 C) (Oral)   Resp 20   Ht 4\' 11"  (1.499 m)   Wt 100 lb 6.4 oz (45.5 kg)   SpO2 96%   BMI 20.28 kg/m   HEMODYNAMICS:  no compromise    INTAKE / OUTPUT: I/O last 3 completed shifts: In: 8665.2 [P.O.:120; I.V.:2870.2; WUJWJ:1914; IV Piggyback:3810] Out: 7829 [Urine:3320]  PHYSICAL EXAMINATION: General:  No acute distress Neuro:  Awake, alert X 3 HEENT:  PERRL, No JVD, moist mucous membranes Cardiovascular:  RRR Lungs:  CTA, port site clear of erythema Abdomen:  Soft, mildly tender Musculoskeletal:  No deformity Skin:  Warm and dry  LABS:  BMET Recent Labs  Lab 12/20/17 0923 12/20/17 2348 12/21/17 0549  NA 131* 131* 133*  K 3.5 2.4* 3.5  CL 102 101 103  CO2 22 22 21*  BUN 12 7 <5*  CREATININE 0.49 0.35* 0.38*  GLUCOSE 140* 88 95    Electrolytes Recent Labs  Lab 12/20/17 0923 12/20/17 2348 12/21/17 0549  CALCIUM 7.8* 7.6* 7.6*  MG 2.3 1.7 1.9  PHOS 3.6 1.7* 2.6    CBC Recent Labs  Lab 12/20/17 0923 12/20/17 2348  12/21/17 0549  WBC 0.1* 0.1* 0.2*  HGB 8.4* 9.7* 8.8*  HCT 24.9* 27.2* 25.4*  PLT 47* 27* 21*    Coag's Recent Labs  Lab 12/19/17 2354 12/20/17 2348  INR 1.28 0.99    Sepsis Markers Recent Labs  Lab 12/19/17 2204 12/20/17 1835 12/21/17 0549  LATICACIDVEN 3.1* 0.9 1.0    ABG No results for input(s): PHART, PCO2ART, PO2ART in the last 168 hours.  Liver Enzymes Recent Labs  Lab 12/19/17 1910 12/20/17 2348  AST 20 15  ALT 11* 10*  ALKPHOS 34* 33*  BILITOT 0.8 1.8*  ALBUMIN 2.9* 2.4*    Cardiac Enzymes Recent Labs  Lab 12/20/17 2348 12/21/17 0549  TROPONINI 0.07* 0.06*    Glucose Recent Labs  Lab 12/19/17 2307 12/20/17 0912 12/21/17 0723  GLUCAP 103* 135* 105*    Imaging Ct Abdomen Pelvis W Contrast  Result Date: 12/20/2017 CLINICAL DATA:  Decreased appetite.  Nausea. EXAM: CT ABDOMEN AND PELVIS WITH CONTRAST TECHNIQUE: Multidetector CT imaging of the abdomen and pelvis was performed using the standard protocol following bolus administration of intravenous contrast. CONTRAST:  7mL ISOVUE-300 IOPAMIDOL (ISOVUE-300) INJECTION 61% COMPARISON:  December 09, 2017 FINDINGS: Lower chest: New pleural effusions are identified, right greater than left, relatively small. Associated atelectasis is noted. A pectus deformity is noted in the lower chest,  unchanged. No nodules, masses, or suspicious infiltrates are identified in the lower chest. Hepatobiliary: The liver is normal in appearance. The gallbladder is more distended in the interval with no obvious stones or definitive wall thickening. There is a small amount of fluid adjacent to the gallbladder which is nonspecific given mild ascites elsewhere which will be described below. The portal vein is patent. Pancreas: Unremarkable. No pancreatic ductal dilatation or surrounding inflammatory changes. Spleen: Normal in size without focal abnormality. Adrenals/Urinary Tract: Adrenal glands are normal. The kidneys, ureters, and  bladder are normal. Stomach/Bowel: The stomach appears thick walled but is poorly evaluated due to lack of distention. There was no wall thickening on the December 09, 2017 study. The small bowel is normal. There appears to be rectal wall thickening as well such as on series 2, image 68 and coronal image 52. No adjacent fat stranding identified. Scattered colonic diverticuli without diverticulitis. The remainder of the colon is otherwise normal. The appendix is normal in appearance and best seen on coronal imaging. Vascular/Lymphatic: Atherosclerotic changes is seen in the nonaneurysmal aorta. No adenopathy. Reproductive: Uterus and bilateral adnexa are unremarkable. Other: Ascites in the abdomen adjacent to the liver, spleen, gallbladder, and in the pericolic gutters and pelvis. Musculoskeletal: No acute or significant osseous findings. IMPRESSION: 1. The stomach wall appears thickened. This could be due to poor distention but gastritis is not excluded. Recommend clinical correlation. 2. The rectal wall appears thickened as well, new in the interval, suggesting proctitis. Recommend clinical correlation. 3. New bilateral pleural effusions and ascites. 4. The gallbladder is mildly distended but there is no definitive cholelithiasis or wall thickening. The fluid adjacent to the gallbladder is likely due to the ascites. An ultrasound could better evaluate if warranted. 5. Electronically Signed   By: Dorise Bullion III M.D   On: 12/20/2017 20:07     STUDIES:  CT scan as above  CULTURES: 2/14 E.Coli bacteremia  ANTIBIOTICS: Meropenem vancomycin  SIGNIFICANT EVENTS: Persistent nausea  LINES/TUBES: Porta cath  DISCUSSION: 72 year old female with active follicular lymphoma, admitted with neutropenic sepsis, anemia of chronic disease and severe thrombocytopenia. She was found to have E.Coli bacteremia.     ASSESSMENT / PLAN:  1. Sepsis- clinically improving. 2. E.Coli bacteremia of unclear  source.  Urinalysis is negative, likely GI source though CT scan has not found a definitive source.  ID has been consulted. 3. Persistent nausea- may be prolonged effect of Chemo 4. Pancytopenia from chemotherapy 5. 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 Last chemo 12/13/17 6. Hyponatremia- improving 7. Diarrhoea- may be from Reglan given for nausea. 8. Hypokalemia  Plan: 1. Continue IVF, replace electrolytes 2. Continue ABX pending sensi however will D/C Vancomycin since no Gram positive ID; ID consult pending 3. D/C Reglan.  Continue on Phenergan, Zofran and Marinol for nausea. 4. GI and DVT prophylaxis 5. Add dietary supplements for nutrition, Out of bed  Prognosis guarded.   FAMILY  - Updates: given to patient's daughter.  I have dedicated a total of 35 minutes in critical care time minus all appropriate exclusions.    Cammie Sickle, M.D Pulmonary and [place] Pager: [phone]  12/21/2017, 1:19 PM

## 2017-12-22 DIAGNOSIS — R197 Diarrhea, unspecified: Secondary | ICD-10-CM

## 2017-12-22 LAB — MAGNESIUM
MAGNESIUM: 1.8 mg/dL (ref 1.7–2.4)
MAGNESIUM: 1.8 mg/dL (ref 1.7–2.4)

## 2017-12-22 LAB — GLUCOSE, CAPILLARY: GLUCOSE-CAPILLARY: 86 mg/dL (ref 65–99)

## 2017-12-22 LAB — CULTURE, BLOOD (ROUTINE X 2)
SPECIAL REQUESTS: ADEQUATE
Special Requests: ADEQUATE

## 2017-12-22 LAB — BASIC METABOLIC PANEL
ANION GAP: 7 (ref 5–15)
BUN: 5 mg/dL — ABNORMAL LOW (ref 6–20)
CALCIUM: 7.9 mg/dL — AB (ref 8.9–10.3)
CO2: 25 mmol/L (ref 22–32)
Chloride: 100 mmol/L — ABNORMAL LOW (ref 101–111)
Creatinine, Ser: 0.33 mg/dL — ABNORMAL LOW (ref 0.44–1.00)
GLUCOSE: 83 mg/dL (ref 65–99)
POTASSIUM: 2.9 mmol/L — AB (ref 3.5–5.1)
SODIUM: 132 mmol/L — AB (ref 135–145)

## 2017-12-22 LAB — POTASSIUM
POTASSIUM: 2.9 mmol/L — AB (ref 3.5–5.1)
POTASSIUM: 4.3 mmol/L (ref 3.5–5.1)

## 2017-12-22 LAB — PHOSPHORUS: Phosphorus: 1.4 mg/dL — ABNORMAL LOW (ref 2.5–4.6)

## 2017-12-22 MED ORDER — POTASSIUM CHLORIDE CRYS ER 20 MEQ PO TBCR
20.0000 meq | EXTENDED_RELEASE_TABLET | Freq: Once | ORAL | Status: AC
  Administered 2017-12-22: 20 meq via ORAL
  Filled 2017-12-22: qty 1

## 2017-12-22 MED ORDER — POTASSIUM CHLORIDE 10 MEQ/100ML IV SOLN
10.0000 meq | INTRAVENOUS | Status: AC
  Administered 2017-12-22 (×6): 10 meq via INTRAVENOUS
  Filled 2017-12-22 (×6): qty 100

## 2017-12-22 MED ORDER — MAGNESIUM OXIDE 400 (241.3 MG) MG PO TABS
400.0000 mg | ORAL_TABLET | Freq: Two times a day (BID) | ORAL | Status: DC
  Administered 2017-12-22 – 2017-12-23 (×3): 400 mg via ORAL
  Filled 2017-12-22 (×3): qty 1

## 2017-12-22 MED ORDER — POTASSIUM CHLORIDE IN NACL 40-0.9 MEQ/L-% IV SOLN
INTRAVENOUS | Status: DC
  Administered 2017-12-22: 75 mL/h via INTRAVENOUS
  Filled 2017-12-22 (×2): qty 1000

## 2017-12-22 MED ORDER — BLISTEX MEDICATED EX OINT
TOPICAL_OINTMENT | CUTANEOUS | Status: DC | PRN
  Filled 2017-12-22: qty 6.3

## 2017-12-22 MED ORDER — LOPERAMIDE HCL 2 MG PO CAPS
2.0000 mg | ORAL_CAPSULE | ORAL | Status: DC | PRN
  Administered 2017-12-22: 2 mg via ORAL
  Filled 2017-12-22: qty 1

## 2017-12-22 MED ORDER — METOPROLOL TARTRATE 5 MG/5ML IV SOLN
2.5000 mg | Freq: Four times a day (QID) | INTRAVENOUS | Status: DC | PRN
  Administered 2017-12-22 – 2017-12-26 (×3): 2.5 mg via INTRAVENOUS
  Filled 2017-12-22 (×4): qty 5

## 2017-12-22 MED ORDER — CEFAZOLIN SODIUM-DEXTROSE 2-4 GM/100ML-% IV SOLN
2.0000 g | Freq: Three times a day (TID) | INTRAVENOUS | Status: DC
  Administered 2017-12-22 – 2017-12-27 (×15): 2 g via INTRAVENOUS
  Filled 2017-12-22 (×21): qty 100

## 2017-12-22 MED ORDER — POTASSIUM & SODIUM PHOSPHATES 280-160-250 MG PO PACK
1.0000 | PACK | Freq: Three times a day (TID) | ORAL | Status: AC
  Administered 2017-12-23 – 2017-12-24 (×7): 1 via ORAL
  Filled 2017-12-22 (×8): qty 1

## 2017-12-22 NOTE — Progress Notes (Signed)
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  She also complained of abdominal pain and back pain.  He takes hydrocodone at home and reports very minimal relief with home dose.  Her last chemo session was 1 week ago.  She denies any other symptoms. Her ED workup showed severe hypokalemia, mild hyponatremia, lactic acidosis, severe neutropenia with a WBC of 0.1, hemoglobin of 7.0, hematocrit of 20.7, and platelets of 6000.  She is being admitted to the ICU for further management.  2/14 Admit   REVIEW OF SYSTEMS:   Less nausea  SUBJECTIVE:  Patient reports she feels better but appetite still not improved  VITAL SIGNS: BP (!) 149/82   Pulse 95   Temp 98.7 F (37.1 C) (Oral)   Resp (!) 22   Ht 4\' 11"  (1.499 m)   Wt 100 lb 12 oz (45.7 kg)   SpO2 94%   BMI 20.35 kg/m   HEMODYNAMICS:  no compromise    INTAKE / OUTPUT: I/O last 3 completed shifts: In: 7118.3 [P.O.:120; I.V.:3048.3; IV Piggyback:3950] Out: 4495 [Urine:4495]  PHYSICAL EXAMINATION: General:  No acute distress Neuro:  Awake, alert X 3 HEENT:  PERRL, No JVD, moist mucous membranes Cardiovascular:  RRR Lungs:  CTA, port site clear of erythema Abdomen:  Soft, mildly tender Musculoskeletal:  No deformity Skin:  Warm and dry  LABS:  BMET Recent Labs  Lab 12/20/17 2348 12/21/17 0549 12/21/17 1242 12/22/17 0343  NA 131* 133*  --  132*  K 2.4* 3.5 3.1* 2.9*  2.9*  CL 101 103  --  100*  CO2 22 21*  --  25  BUN 7 <5*  --  5*  CREATININE 0.35* 0.38*  --  0.33*  GLUCOSE 88 95  --  83    Electrolytes Recent Labs  Lab 12/20/17 0923 12/20/17 2348 12/21/17 0549 12/22/17 0343  CALCIUM 7.8* 7.6* 7.6* 7.9*  MG 2.3 1.7 1.9 1.8  PHOS 3.6 1.7* 2.6   --     CBC Recent Labs  Lab 12/20/17 0923 12/20/17 2348 12/21/17 0549  WBC 0.1* 0.1* 0.2*  HGB 8.4* 9.7* 8.8*  HCT 24.9* 27.2* 25.4*  PLT 47* 27* 21*    Coag's Recent Labs  Lab 12/19/17 2354 12/20/17 2348  INR 1.28 0.99    Sepsis Markers Recent Labs  Lab 12/19/17 2204 12/20/17 1835 12/21/17 0549  LATICACIDVEN 3.1* 0.9 1.0    ABG No results for input(s): PHART, PCO2ART, PO2ART in the last 168 hours.  Liver Enzymes Recent Labs  Lab 12/19/17 1910 12/20/17 2348  AST 20 15  ALT 11* 10*  ALKPHOS 34* 33*  BILITOT 0.8 1.8*  ALBUMIN 2.9* 2.4*    Cardiac Enzymes Recent Labs  Lab 12/20/17 2348 12/21/17 0549 12/21/17 1242  TROPONINI 0.07* 0.06* 0.05*    Glucose Recent Labs  Lab 12/19/17 2307 12/20/17 0912 12/21/17 0723 12/22/17 0758  GLUCAP 103* 135* 105* 86    Imaging No results found.   STUDIES:  CT scan as above  CULTURES: 2/14 E.Coli bacteremia  ANTIBIOTICS: Meropenem vancomycin  SIGNIFICANT EVENTS: 2/14 Admit  LINES/TUBES: Porta cath  DISCUSSION: 72 year old female with active follicular lymphoma, admitted with neutropenic sepsis, anemia of chronic disease and severe thrombocytopenia. She  was found to have E.Coli bacteremia. He severe nausea has significantly improved since staring Marinol. She is still having diarrhoea.     ASSESSMENT / PLAN:  1. Sepsis- has resolved 2. E.Coli bacteremia of unclear source.  Urinalysis is negative, likely GI source though CT scan has not found a definitive source.  ID has been consulted. 3. Persistent nausea-has significantly improved 4. Pancytopenia from chemotherapy 5. 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 Last chemo 12/13/17 6. Hyponatremia- improving 7. Diarrhoea- patient has been on Imodium at home for diarrhoea so will restart. 8. Hypokalemia  Plan: 1. Replace electrolytes, Decrease IVF to avoid polyuria and therefore electrolytes imbalance. 2. ABX changed to Ancep  as E. Coli is very sensitive 3. D/C Reglan.  Continue on Phenergan, Zofran and Marinol for nausea. 4. GI and DVT prophylaxis 5. Add dietary supplements for nutrition, Out of bed 6. Restart Imodium cautiously, will need to monitor to avoid obstipation as patient is on narcotic analgesia at home.  Disp: May be transferred  to Telemetry  FAMILY  - Updates: given to patient's daughter.    Cammie Sickle, M.D Pulmonary [facility] [place] Pager: [phone]  12/22/2017, 12:38 PM

## 2017-12-22 NOTE — Progress Notes (Signed)
Pharmacy Antibiotic Note  Veronica Boyd is a 72 y.o. female admitted on 12/19/2017 with E. coli bacteremia.  Pharmacy has been consulted for cefazolin dosing.  Per discussion with intensivist, will de-escalate antibiotics.  Plan: Discontinue meropenem Order cefazolin 2 g IV q8h  Height: 4\' 11"  (149.9 cm) Weight: 100 lb 12 oz (45.7 kg) IBW/kg (Calculated) : 43.2  Temp (24hrs), Avg:98.3 F (36.8 C), Min:97.7 F (36.5 C), Max:98.8 F (37.1 C)  Recent Labs  Lab 12/19/17 1910 12/19/17 2204 12/19/17 2354 12/20/17 0923 12/20/17 1835 12/20/17 2348 12/21/17 0549 12/22/17 0343  WBC 0.1*  --   --  0.1*  --  0.1* 0.2*  --   CREATININE 0.83  --  0.67 0.49  --  0.35* 0.38* 0.33*  LATICACIDVEN 2.7* 3.1*  --   --  0.9  --  1.0  --     Estimated Creatinine Clearance: 42.1 mL/min (A) (by C-G formula based on SCr of 0.33 mg/dL (L)).    No Known Allergies  Antimicrobials this admission: cefazolin 2/17 >>  meropenem 2/15 >> 2/17 Vancomycin/zosyn 2/14 > 2/15  Dose adjustments this admission:  Microbiology results: 2/14 BCx: E. Coli, pan-sensitive   Thank you for allowing pharmacy to be a part of this patient's care.  Lenis Noon, PharmD 12/22/2017 12:46 PM

## 2017-12-22 NOTE — Progress Notes (Signed)
Patient ID: Veronica Boyd, female   DOB: [birth year], 72 y.o.   MRN: [id number]  [facility] PROGRESS NOTE  Veronica Boyd TKZ:[id number] DOB: 10-Sep-1946 DOA: 12/19/2017 PCP: Glendon Axe, MD  HPI/Subjective: Patient feeling a little bit better today.  Yesterday had a lot of nausea and vomiting.  Also had some diarrhea.  Objective: Vitals:   12/22/17 1500 12/22/17 1559  BP: 117/73 133/72  Pulse: (!) 104 94  Resp: (!) 7 18  Temp:  98.4 F (36.9 C)  SpO2: 95% 98%    Filed Weights   12/19/17 1839 12/20/17 0500 12/22/17 0356  Weight: 42.6 kg (94 lb) 45.5 kg (100 lb 6.4 oz) 45.7 kg (100 lb 12 oz)    ROS: Review of Systems  Constitutional: Negative for chills and fever.  Eyes: Negative for blurred vision.  Respiratory: Negative for cough and shortness of breath.   Cardiovascular: Negative for chest pain.  Gastrointestinal: Positive for diarrhea and nausea. Negative for abdominal pain, constipation and vomiting.  Genitourinary: Negative for dysuria.  Musculoskeletal: Negative for joint pain.  Neurological: Negative for dizziness and headaches.   Exam: Physical Exam  Constitutional: She is oriented to person, place, and time.  HENT:  Nose: No mucosal edema.  Mouth/Throat: No oropharyngeal exudate or posterior oropharyngeal edema.  Eyes: Conjunctivae, EOM and lids are normal. Pupils are equal, round, and reactive to light.  Neck: No JVD present. Carotid bruit is not present. No edema present. No thyroid mass and no thyromegaly present.  Cardiovascular: S1 normal and S2 normal. Exam reveals no gallop.  No murmur heard. Pulses:      Dorsalis pedis pulses are 2+ on the right side, and 2+ on the left side.  Respiratory: No respiratory distress. She has decreased breath sounds in the right lower field and the left lower field. She has no wheezes. She has no rhonchi. She has no rales.  GI: Soft. Bowel sounds are normal. There is no tenderness.  Musculoskeletal:       Right  ankle: She exhibits no swelling.       Left ankle: She exhibits no swelling.  Lymphadenopathy:    She has no cervical adenopathy.  Neurological: She is alert and oriented to person, place, and time. No cranial nerve deficit.  Skin: Skin is warm. No rash noted. Nails show no clubbing.  Psychiatric: She has a normal mood and affect.      Data Reviewed: Basic Metabolic Panel: Recent Labs  Lab 12/19/17 2354 12/20/17 0923 12/20/17 2348 12/21/17 0549 12/21/17 1242 12/22/17 0343  NA 135 131* 131* 133*  --  132*  K 2.8* 3.5 2.4* 3.5 3.1* 2.9*  2.9*  CL 106 102 101 103  --  100*  CO2 22 22 22  21*  --  25  GLUCOSE 119* 140* 88 95  --  83  BUN 10 12 7  <5*  --  5*  CREATININE 0.67 0.49 0.35* 0.38*  --  0.33*  CALCIUM 7.0* 7.8* 7.6* 7.6*  --  7.9*  MG 1.4* 2.3 1.7 1.9  --  1.8  PHOS 2.4* 3.6 1.7* 2.6  --   --    Liver Function Tests: Recent Labs  Lab 12/19/17 1910 12/20/17 2348  AST 20 15  ALT 11* 10*  ALKPHOS 34* 33*  BILITOT 0.8 1.8*  PROT 5.7* 4.8*  ALBUMIN 2.9* 2.4*    CBC: Recent Labs  Lab 12/19/17 1910 12/20/17 0923 12/20/17 2348 12/21/17 0549  WBC 0.1* 0.1* 0.1* 0.2*  NEUTROABS 0.0*  --   --  0.0*  HGB 7.0* 8.4* 9.7* 8.8*  HCT 20.7* 24.9* 27.2* 25.4*  MCV 102.9* 89.8 89.5 90.1  PLT 6* 47* 27* 21*   Cardiac Enzymes: Recent Labs  Lab 12/20/17 2348 12/21/17 0549 12/21/17 1242  TROPONINI 0.07* 0.06* 0.05*    CBG: Recent Labs  Lab 12/19/17 2307 12/20/17 0912 12/21/17 0723 12/22/17 0758  GLUCAP 103* 135* 105* 86    Recent Results (from the past 240 hour(s))  Blood Culture (routine x 2)     Status: Abnormal   Collection Time: 12/19/17  7:10 PM  Result Value Ref Range Status   Specimen Description   Final    BLOOD LEFT WRIST Performed at [facility], [address], [place], [place] [address]    Special Requests   Final    BOTTLES DRAWN AEROBIC AND ANAEROBIC Blood Culture adequate volume Performed at [facility], [address] [place], [place] [address]    Culture  Setup Time   Final    GRAM NEGATIVE RODS IN BOTH AEROBIC AND ANAEROBIC BOTTLES CRITICAL RESULT CALLED TO, READ BACK BY AND VERIFIED WITH: HANK ZOMPA AT 8756 ON 12/20/17 [place]. Performed at [facility], [place] [address], [place], [place] [address]    Culture ESCHERICHIA COLI (A)  Final   Report Status 12/22/2017 FINAL  Final   Organism ID, Bacteria ESCHERICHIA COLI  Final      Susceptibility   Escherichia coli - MIC*    AMPICILLIN 8 SENSITIVE Sensitive     CEFAZOLIN <=4 SENSITIVE Sensitive     CEFEPIME <=1 SENSITIVE Sensitive     CEFTAZIDIME <=1 SENSITIVE Sensitive     CEFTRIAXONE <=1 SENSITIVE Sensitive     CIPROFLOXACIN <=0.25 SENSITIVE Sensitive     GENTAMICIN <=1 SENSITIVE Sensitive     IMIPENEM <=0.25 SENSITIVE Sensitive     TRIMETH/SULFA <=20 SENSITIVE Sensitive     AMPICILLIN/SULBACTAM <=2 SENSITIVE Sensitive     PIP/TAZO <=4 SENSITIVE Sensitive     Extended ESBL NEGATIVE Sensitive     * ESCHERICHIA COLI  Blood Culture ID Panel (Reflexed)     Status: Abnormal   Collection Time: 12/19/17  7:10 PM  Result Value Ref Range Status   Enterococcus species NOT DETECTED NOT DETECTED Final   Listeria monocytogenes NOT DETECTED NOT DETECTED Final   Staphylococcus species NOT DETECTED NOT DETECTED Final   Staphylococcus aureus NOT DETECTED NOT DETECTED Final   Streptococcus species NOT DETECTED NOT DETECTED Final   Streptococcus agalactiae NOT DETECTED NOT DETECTED Final   Streptococcus pneumoniae NOT DETECTED NOT DETECTED Final   Streptococcus pyogenes NOT DETECTED NOT DETECTED Final   Acinetobacter baumannii NOT DETECTED NOT DETECTED Final   Enterobacteriaceae species DETECTED (A) NOT DETECTED Final    Comment: Enterobacteriaceae represent a large family of gram-negative bacteria, not a single organism. CRITICAL RESULT CALLED TO, READ BACK BY AND VERIFIED WITH: HANK ZOMPA AT 5188 ON 12/20/17 [place].    Enterobacter cloacae  complex NOT DETECTED NOT DETECTED Final   Escherichia coli DETECTED (A) NOT DETECTED Final    Comment: CRITICAL RESULT CALLED TO, READ BACK BY AND VERIFIED WITH: HANK ZOMPA AT 4166 ON 12/20/17 [place].    Klebsiella oxytoca NOT DETECTED NOT DETECTED Final   Klebsiella pneumoniae NOT DETECTED NOT DETECTED Final   Proteus species NOT DETECTED NOT DETECTED Final   Serratia marcescens NOT DETECTED NOT DETECTED Final   Carbapenem resistance NOT DETECTED NOT DETECTED Final   Haemophilus influenzae NOT DETECTED NOT DETECTED Final  Neisseria meningitidis NOT DETECTED NOT DETECTED Final   Pseudomonas aeruginosa NOT DETECTED NOT DETECTED Final   Candida albicans NOT DETECTED NOT DETECTED Final   Candida glabrata NOT DETECTED NOT DETECTED Final   Candida krusei NOT DETECTED NOT DETECTED Final   Candida parapsilosis NOT DETECTED NOT DETECTED Final   Candida tropicalis NOT DETECTED NOT DETECTED Final    Comment: Performed at [facility], [address] [place], [place] [address]  Blood Culture (routine x 2)     Status: Abnormal   Collection Time: 12/19/17  7:40 PM  Result Value Ref Range Status   Specimen Description   Final    BLOOD RFOA Performed at [facility], [address] [place], [place] [address]    Special Requests   Final    BOTTLES DRAWN AEROBIC AND ANAEROBIC Blood Culture adequate volume Performed at [facility], [address] [place], [place] [address]    Culture  Setup Time   Final    GRAM NEGATIVE RODS ANAEROBIC BOTTLE ONLY CRITICAL RESULT CALLED TO, READ BACK BY AND VERIFIED WITH: HANK ZOMPA AT 7673 ON 12/20/17 [place]. Performed at [facility], [address] [place], [place] [address]    Culture (A)  Final    ESCHERICHIA COLI SUSCEPTIBILITIES PERFORMED ON PREVIOUS CULTURE WITHIN THE LAST 5 DAYS. Performed at [facility], [place] [address], [place], [place] [address]    Report Status 12/22/2017 FINAL  Final  Urine culture      Status: Abnormal   Collection Time: 12/19/17  7:40 PM  Result Value Ref Range Status   Specimen Description   Final    URINE, RANDOM Performed at [facility], [address] [place], [place] [address]    Special Requests   Final    NONE Performed at [facility], [address], [place], [place] [address]    Culture (A)  Final    <10,000 COLONIES/mL Performed at [facility], [place] [address], [place], [place] [address]    Report Status 12/21/2017 FINAL  Final  MRSA PCR Screening     Status: None   Collection Time: 12/19/17 11:11 PM  Result Value Ref Range Status   MRSA by PCR NEGATIVE NEGATIVE Final    Comment:        The GeneXpert MRSA Assay (FDA approved for NASAL specimens only), is one component of a comprehensive MRSA colonization surveillance program. It is not intended to diagnose MRSA infection nor to guide or monitor treatment for MRSA infections. Performed at [facility], [address] [place], [place] [address]   CULTURE, BLOOD (ROUTINE X 2) w Reflex to ID Panel     Status: None (Preliminary result)   Collection Time: 12/21/17  5:57 AM  Result Value Ref Range Status   Specimen Description BLOOD RIGHT HAND  Final   Special Requests   Final    BOTTLES DRAWN AEROBIC AND ANAEROBIC Blood Culture adequate volume   Culture   Final    NO GROWTH < 24 HOURS Performed at [facility], [address] [place],  [address]    Report Status PENDING  Incomplete  CULTURE, BLOOD (ROUTINE X 2) w Reflex to ID Panel     Status: None (Preliminary result)   Collection Time: 12/21/17  5:57 AM  Result Value Ref Range Status   Specimen Description BLOOD LEFT ANTECUBITAL  Final   Special Requests   Final    BOTTLES DRAWN AEROBIC AND  ANAEROBIC Blood Culture adequate volume   Culture   Final    NO GROWTH < 24 HOURS Performed at [facility], [address] [place], [place] [address]    Report Status PENDING   Incomplete  C difficile quick scan w PCR reflex     Status: None   Collection Time: 12/21/17 10:24 AM  Result Value Ref Range Status   C Diff antigen NEGATIVE NEGATIVE Final   C Diff toxin NEGATIVE NEGATIVE Final   C Diff interpretation No C. difficile detected.  Final    Comment: Performed at [facility], [address], [place], [place] [address]     Studies: Ct Abdomen Pelvis W Contrast  Result Date: 12/20/2017 CLINICAL DATA:  Decreased appetite.  Nausea. EXAM: CT ABDOMEN AND PELVIS WITH CONTRAST TECHNIQUE: Multidetector CT imaging of the abdomen and pelvis was performed using the standard protocol following bolus administration of intravenous contrast. CONTRAST:  22mL ISOVUE-300 IOPAMIDOL (ISOVUE-300) INJECTION 61% COMPARISON:  December 09, 2017 FINDINGS: Lower chest: New pleural effusions are identified, right greater than left, relatively small. Associated atelectasis is noted. A pectus deformity is noted in the lower chest, unchanged. No nodules, masses, or suspicious infiltrates are identified in the lower chest. Hepatobiliary: The liver is normal in appearance. The gallbladder is more distended in the interval with no obvious stones or definitive wall thickening. There is a small amount of fluid adjacent to the gallbladder which is nonspecific given mild ascites elsewhere which will be described below. The portal vein is patent. Pancreas: Unremarkable. No pancreatic ductal dilatation or surrounding inflammatory changes. Spleen: Normal in size without focal abnormality. Adrenals/Urinary Tract: Adrenal glands are normal. The kidneys, ureters, and bladder are normal. Stomach/Bowel: The stomach appears thick walled but is poorly evaluated due to lack of distention. There was no wall thickening on the December 09, 2017 study. The small bowel is normal. There appears to be rectal wall thickening as well such as on series 2, image 68 and coronal image 52. No adjacent fat stranding identified.  Scattered colonic diverticuli without diverticulitis. The remainder of the colon is otherwise normal. The appendix is normal in appearance and best seen on coronal imaging. Vascular/Lymphatic: Atherosclerotic changes is seen in the nonaneurysmal aorta. No adenopathy. Reproductive: Uterus and bilateral adnexa are unremarkable. Other: Ascites in the abdomen adjacent to the liver, spleen, gallbladder, and in the pericolic gutters and pelvis. Musculoskeletal: No acute or significant osseous findings. IMPRESSION: 1. The stomach wall appears thickened. This could be due to poor distention but gastritis is not excluded. Recommend clinical correlation. 2. The rectal wall appears thickened as well, new in the interval, suggesting proctitis. Recommend clinical correlation. 3. New bilateral pleural effusions and ascites. 4. The gallbladder is mildly distended but there is no definitive cholelithiasis or wall thickening. The fluid adjacent to the gallbladder is likely due to the ascites. An ultrasound could better evaluate if warranted. 5. Electronically Signed   By: Dorise Bullion III M.D   On: 12/20/2017 20:07    Scheduled Meds: . dronabinol  5 mg Oral BID AC  . feeding supplement  1 Container Oral TID BM  . magnesium oxide  400 mg Oral BID  . multivitamin with minerals  1 tablet Oral Daily  . pantoprazole (PROTONIX) IV  40 mg Intravenous Q24H  . simvastatin  20 mg Oral QHS   Continuous Infusions: .  ceFAZolin (ANCEF) IV Stopped (12/22/17 1557)    Assessment/Plan:  1. E. coli sepsis and neutropenic fever.  Patient  switched to Ancef from meropenem.  Source could be rectal inflammation seen on CT scan yesterday.  Check CBC with differential tomorrow.  White blood cell count yesterday still very low. 2. Recurrent follicular lymphoma and pancytopenia.  Patient is status post platelet and packed red blood cell transfusion.  Last red blood cell count 8.8.  Last platelet count 21. 3. Hypokalemia and  hypomagnesemia.  Electrolytes replaced today via electrolyte protocol in the ICU.  Check again tomorrow. 4. Nausea vomiting.  Seems a little bit better today.  PRN medications.  Patient started on Marinol 5. Diarrhea.  Started on Imodium 6. Hyperlipidemia unspecified on simvastatin 7. Hyponatremia improved to 132   Code Status:     Code Status Orders  (From admission, onward)        Start     Ordered   12/19/17 2244  Full code  Continuous     12/19/17 2243    Code Status History    Date Active Date Inactive Code Status Order ID Comments User Context   This patient has a current code status but no historical code status.    Advance Directive Documentation     Most Recent Value  Type of Advance Directive  Living will  Pre-existing out of facility DNR order (yellow form or pink MOST form)  No data  "MOST" Form in Place?  No data     Family Communication: Daughter at bedside Disposition Plan: To be determined  Consultants:  Critical care specialist  Oncology  Infectious disease consultation also placed but likely will not be done over the weekend.  Antibiotics:  Meropenem  Time spent: 24 minutes  [place]

## 2017-12-22 NOTE — Progress Notes (Signed)
Nutrition Follow-up  DOCUMENTATION CODES:   Severe malnutrition in context of chronic illness  INTERVENTION:  Continue Boost Breeze po TID, each supplement provides 250 kcal and 9 grams of protein.  Also provide Magic cup TID with meals, each supplement provides 290 kcal and 9 grams of protein. Patient prefers chocolate or orange flavors.  Continue daily MVI.  Encouraged family to offer small meals or snacks frequently throughout the day (up to every hour when patient is awake) as she has very poor PO intake and is sleeping frequently.  NUTRITION DIAGNOSIS:   Severe Malnutrition related to chronic illness as evidenced by severe fat depletion, severe muscle depletion.  Ongoing.  GOAL:   Patient will meet greater than or equal to 90% of their needs  Not met - patient taking only bites/sips of meals.  MONITOR:   PO intake, I & O's, Labs, Supplement acceptance, Weight trends  REASON FOR ASSESSMENT:   Malnutrition Screening Tool, Consult Poor PO  ASSESSMENT:   Veronica Boyd is a 72 yo female  with PMH progressive follicular lymphoma status post fifth chop cycle 1 week ago, who presents with neutropenic fever, sepsis,   Patient was seen on 2/15 for MST. RD now receiving consult in setting of poor PO intake and to add supplement recommendations.  Met with patient and family members at bedside. Family reports that she had chemotherapy last Thursday. They report after she receives chemotherapy it typically takes her a little over a week before she is able to eat better. At home she only takes bites of meals. She has tried many oral nutrition supplements and the only ones she can tolerate are the clear ones such as [employer], but even that she cannot drink regularly. Yesterday she had bites of soup from [place] and some baked potato. Yesterday she also had 1/2 Boost Breeze and then sips of another later. She did not want breakfast this morning. They report she does like ice  cream. Will try Magic Cup.  Medications reviewed and include: Colace, Marinol 5 mg BID, magnesium oxide 400 mg BID, MVI daily, pantoprazole, meropenem, potassium chloride 10 mEq for 6 runs today.  Labs reviewed: CBG 86, Sodium 132, Potassium 2.9, Chloride 100, BUN 5, Creatinine 0.33, Magnesium 1.8 (WNL). Phosphorus was 2.6 (WNL on 2/16) following supplementation.  Discussed with RN and MD. Megace was changed to Marinol. Family has been bringing in food from outside that patient enjoys better than hospital food.  Diet Order:  Diet regular Room service appropriate? Yes; Fluid consistency: Thin  EDUCATION NEEDS:   Education needs have been addressed  Skin:  Skin Assessment: Reviewed RN Assessment  Last BM:  12/20/2017  Height:   Ht Readings from Last 1 Encounters:  12/19/17 4' 11"  (1.499 m)    Weight:   Wt Readings from Last 1 Encounters:  12/22/17 100 lb 12 oz (45.7 kg)    Ideal Body Weight:  44.7 kg  BMI:  Body mass index is 20.35 kg/m.  Estimated Nutritional Needs:   Kcal:  1363-1600 calories (30-35 cal/kg)  Protein:  68-77 grams (1.5-1.7g/kg)  Fluid:  >1.5L  Willey Blade, MS, RD, LDN Office: [phone] Pager: [phone] After Hours/Weekend Pager: [phone]

## 2017-12-22 NOTE — Progress Notes (Signed)
Pharmacy Antibiotic Note  Veronica Boyd is a 72 y.o. female admitted on 12/19/2017 with E.coli bacteremia.  Pharmacy has been consulted for meorpenem dosing.  Patient was started on meropenem. Pharmacy consult entered to follow dosing.  Plan: Continue meropenem 1 g IV q12h  Height: 4\' 11"  (149.9 cm) Weight: 100 lb 12 oz (45.7 kg) IBW/kg (Calculated) : 43.2  Temp (24hrs), Avg:98.3 F (36.8 C), Min:98 F (36.7 C), Max:98.8 F (37.1 C)  Recent Labs  Lab 12/19/17 1910 12/19/17 2204 12/19/17 2354 12/20/17 0923 12/20/17 1835 12/20/17 2348 12/21/17 0549 12/22/17 0343  WBC 0.1*  --   --  0.1*  --  0.1* 0.2*  --   CREATININE 0.83  --  0.67 0.49  --  0.35* 0.38* 0.33*  LATICACIDVEN 2.7* 3.1*  --   --  0.9  --  1.0  --     Estimated Creatinine Clearance: 42.1 mL/min (A) (by C-G formula based on SCr of 0.33 mg/dL (L)).    No Known Allergies  Thank you for allowing pharmacy to be a part of this patient's care.  Lenis Noon, PharmD, BCPS Clinical Pharmacist 12/22/2017 8:33 AM

## 2017-12-23 LAB — CBC WITH DIFFERENTIAL/PLATELET
BASOS ABS: 0 10*3/uL (ref 0–0.1)
Basophils Relative: 0 %
EOS ABS: 0 10*3/uL (ref 0–0.7)
Eosinophils Relative: 0 %
HCT: 29.2 % — ABNORMAL LOW (ref 35.0–47.0)
HEMOGLOBIN: 9.9 g/dL — AB (ref 12.0–16.0)
LYMPHS PCT: 4 %
Lymphs Abs: 0.1 10*3/uL — ABNORMAL LOW (ref 1.0–3.6)
MCH: 31.3 pg (ref 26.0–34.0)
MCHC: 34 g/dL (ref 32.0–36.0)
MCV: 91.9 fL (ref 80.0–100.0)
Monocytes Absolute: 0.7 10*3/uL (ref 0.2–0.9)
Monocytes Relative: 51 %
NEUTROS PCT: 45 %
Neutro Abs: 0.6 10*3/uL — ABNORMAL LOW (ref 1.4–6.5)
Platelets: 8 10*3/uL — CL (ref 150–440)
RBC: 3.18 MIL/uL — AB (ref 3.80–5.20)
RDW: 21.2 % — ABNORMAL HIGH (ref 11.5–14.5)
WBC: 1.4 10*3/uL — AB (ref 3.6–11.0)

## 2017-12-23 LAB — GLUCOSE, CAPILLARY
GLUCOSE-CAPILLARY: 94 mg/dL (ref 65–99)
Glucose-Capillary: 143 mg/dL — ABNORMAL HIGH (ref 65–99)
Glucose-Capillary: 124 mg/dL — ABNORMAL HIGH (ref 65–99)
Glucose-Capillary: 95 mg/dL (ref 65–99)

## 2017-12-23 LAB — BASIC METABOLIC PANEL
ANION GAP: 8 (ref 5–15)
BUN: 7 mg/dL (ref 6–20)
CALCIUM: 8.2 mg/dL — AB (ref 8.9–10.3)
CO2: 25 mmol/L (ref 22–32)
Chloride: 98 mmol/L — ABNORMAL LOW (ref 101–111)
Creatinine, Ser: 0.43 mg/dL — ABNORMAL LOW (ref 0.44–1.00)
Glucose, Bld: 83 mg/dL (ref 65–99)
POTASSIUM: 3.7 mmol/L (ref 3.5–5.1)
Sodium: 131 mmol/L — ABNORMAL LOW (ref 135–145)

## 2017-12-23 LAB — PHOSPHORUS: PHOSPHORUS: 1.9 mg/dL — AB (ref 2.5–4.6)

## 2017-12-23 MED ORDER — SODIUM CHLORIDE 0.9 % IV SOLN
Freq: Once | INTRAVENOUS | Status: AC
  Administered 2017-12-23: 14:00:00 via INTRAVENOUS

## 2017-12-23 NOTE — Consult Note (Signed)
[facility] Infectious Disease     Reason for Consult: Neutropenic fever, E coli bacteremia    Referring Physician: Karlton Lemon Date of Admission:  12/19/2017   Active Problems:   Sepsis ([place])   Anemia   Thrombocytopenia (HCC)   Protein-calorie malnutrition, severe   HPI: Veronica Boyd is a 72 y.o. female with hx of lymphoma undergoing chemo with [facility] admitted with fevers, weakness and neutropenia. She had been having poor po intake as well as diarrhea which patient and family reports has been recurrent after each chemo session. On admit WBC 0.1 and temp low grade. Found to have [place] + e coli, UCX neg, C diff neg. CT shows proctitis.   Initially in ICU but now transferred to floor. WBC up to 1.4 but plts only 8. ANC 0.6.  Still with diarrhea, poor po intake.   Past Medical History:  Diagnosis Date  . Arthritis   . Cataract    bilateral  . Chicken pox   . Colon polyp   . 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 Hyperlipidemia   . Osteoporosis    Past Surgical History:  Procedure Laterality Date  . AXILLARY LYMPH NODE DISSECTION Right 08/21/2016   Procedure: AXILLARY LYMPH NODE excision;  Surgeon: Leonie Green, MD;  Location: [facility] ORS;  Service: General;  Laterality: Right;  . CATARACT EXTRACTION, BILATERAL Bilateral   . JOINT REPLACEMENT Left   . PORTA CATH INSERTION N/A 09/16/2017   Procedure: PORTA CATH INSERTION;  Surgeon: Algernon Huxley, MD;  Location: [facility];  Service: Cardiovascular;  Laterality: N/A;  . TONSILLECTOMY    . TOTAL HIP ARTHROPLASTY Left 1992   Social History   Tobacco Use  . Smoking status: Never Smoker  . Smokeless tobacco: Never Used  Substance Use Topics  . Alcohol use: No  . Drug use: No   Family History  Problem Relation Age of Onset  . Diabetes Sister   . Lung cancer Brother   . Diabetes Brother   . Basal cell carcinoma Daughter   . Breast cancer Paternal Aunt     Allergies: No Known  Allergies  Current antibiotics: Antibiotics Given (last 72 hours)    Date/Time Action Medication Dose Rate   12/20/17 2135 New Bag/Given   vancomycin (VANCOCIN) IVPB 750 mg/150 ml premix 750 mg 150 mL/hr   12/20/17 2237 New Bag/Given   meropenem (MERREM) 1 g in sodium chloride 0.9 % 100 mL IVPB 1 g 200 mL/hr   12/21/17 0206 New Bag/Given   metroNIDAZOLE (FLAGYL) IVPB 500 mg 500 mg 100 mL/hr   12/21/17 0823 New Bag/Given   metroNIDAZOLE (FLAGYL) IVPB 500 mg 500 mg 100 mL/hr   12/21/17 1022 New Bag/Given   meropenem (MERREM) 1 g in sodium chloride 0.9 % 100 mL IVPB 1 g 200 mL/hr   12/21/17 1700 New Bag/Given   metroNIDAZOLE (FLAGYL) IVPB 500 mg 500 mg 100 mL/hr   12/21/17 2125 New Bag/Given   meropenem (MERREM) 1 g in sodium chloride 0.9 % 100 mL IVPB 1 g 200 mL/hr   12/22/17 0002 New Bag/Given   metroNIDAZOLE (FLAGYL) IVPB 500 mg 500 mg 100 mL/hr   12/22/17 0725 New Bag/Given   metroNIDAZOLE (FLAGYL) IVPB 500 mg 500 mg 100 mL/hr   12/22/17 0909 New Bag/Given   meropenem (MERREM) 1 g in sodium chloride 0.9 % 100 mL IVPB 1 g 200 mL/hr   12/22/17 1456 New Bag/Given   ceFAZolin (ANCEF) IVPB 2g/100  mL premix 2 g 200 mL/hr   12/22/17 2057 New Bag/Given   ceFAZolin (ANCEF) IVPB 2g/100 mL premix 2 g 200 mL/hr   12/23/17 8242 New Bag/Given   ceFAZolin (ANCEF) IVPB 2g/100 mL premix 2 g 200 mL/hr   12/23/17 1500 New Bag/Given   ceFAZolin (ANCEF) IVPB 2g/100 mL premix 2 g 200 mL/hr      MEDICATIONS: . dronabinol  5 mg Oral BID AC  . feeding supplement  1 Container Oral TID BM  . multivitamin with minerals  1 tablet Oral Daily  . pantoprazole (PROTONIX) IV  40 mg Intravenous Q24H  . potassium & sodium phosphates  1 packet Oral TID AC & HS  . simvastatin  20 mg Oral QHS    Review of Systems - 11 systems reviewed and negative per HPI   OBJECTIVE: Temp:  [98.2 F (36.8 C)-98.6 F (37 C)] 98.5 F (36.9 C) (02/18 1445) Pulse Rate:  [89-111] 109 (02/18 1445) Resp:  [15-23] 22 (02/18  1445) BP: (110-135)/(50-74) 110/65 (02/18 1445) SpO2:  [95 %-99 %] 97 % (02/18 1445) Physical Exam  Constitutional:  oriented to person, place, and time. Thin, chronicaly ill appearing HENT: [place]/AT, PERRLA, no scleral icterus pale Mouth/Throat: Oropharynx is clear and dry . No oropharyngeal exudate.  Cardiovascular: Normal rate, regular rhythm and normal heart sounds. Pulmonary/Chest: Effort normal and breath sounds normal. No respiratory distress.  has no wheezes.  Neck = supple, no nuchal rigidity Abdominal: Soft. Bowel sounds are normal.  exhibits no distension. There is no tenderness.  Lymphadenopathy: no cervical adenopathy. No axillary adenopathy Portacath site wnl Neurological: alert and oriented to person, place, and time.  Skin: Skin is warm and dry. Some bruising Psychiatric: a normal mood and affect.  behavior is normal.    LABS: Results for orders placed or performed during the hospital encounter of 12/19/17 (from the past 48 hour(s))  Potassium     Status: Abnormal   Collection Time: 12/22/17  3:43 AM  Result Value Ref Range   Potassium 2.9 (L) 3.5 - 5.1 mmol/L    Comment: Performed at [facility], [address] [place], [place] [address]  Magnesium     Status: None   Collection Time: 12/22/17  3:43 AM  Result Value Ref Range   Magnesium 1.8 1.7 - 2.4 mg/dL    Comment: Performed at [facility], [address] [place], [place] [address]  Basic metabolic panel     Status: Abnormal   Collection Time: 12/22/17  3:43 AM  Result Value Ref Range   Sodium 132 (L) 135 - 145 mmol/L   Potassium 2.9 (L) 3.5 - 5.1 mmol/L   Chloride 100 (L) 101 - 111 mmol/L   CO2 25 22 - 32 mmol/L   Glucose, Bld 83 65 - 99 mg/dL   BUN 5 (L) 6 - 20 mg/dL   Creatinine, Ser 0.33 (L) 0.44 - 1.00 mg/dL   Calcium 7.9 (L) 8.9 - 10.3 mg/dL   GFR calc non Af Amer >60 >60 mL/min   GFR calc Af Amer >60 >60 mL/min    Comment: (NOTE) The eGFR has been calculated using the CKD EPI  equation. This calculation has not been validated in all clinical situations. eGFR's persistently <60 mL/min signify possible Chronic Kidney Disease.    Anion gap 7 5 - 15    Comment: Performed at [facility], [place], [place] [address]  Glucose, capillary     Status: None   Collection  Time: 12/22/17  7:58 AM  Result Value Ref Range   Glucose-Capillary 86 65 - 99 mg/dL  Magnesium     Status: None   Collection Time: 12/22/17  4:00 PM  Result Value Ref Range   Magnesium 1.8 1.7 - 2.4 mg/dL    Comment: Performed at [facility], [address] [place], [place] [address]  Phosphorus     Status: Abnormal   Collection Time: 12/22/17  4:00 PM  Result Value Ref Range   Phosphorus 1.4 (L) 2.5 - 4.6 mg/dL    Comment: Performed at [facility], [address] [place], [place] [address]  Potassium     Status: None   Collection Time: 12/22/17  4:00 PM  Result Value Ref Range   Potassium 4.3 3.5 - 5.1 mmol/L    Comment: Performed at [facility], [address] [place], [place] [address]  Basic metabolic panel     Status: Abnormal   Collection Time: 12/23/17  4:46 AM  Result Value Ref Range   Sodium 131 (L) 135 - 145 mmol/L   Potassium 3.7 3.5 - 5.1 mmol/L   Chloride 98 (L) 101 - 111 mmol/L   CO2 25 22 - 32 mmol/L   Glucose, Bld 83 65 - 99 mg/dL   BUN 7 6 - 20 mg/dL   Creatinine, Ser 0.43 (L) 0.44 - 1.00 mg/dL   Calcium 8.2 (L) 8.9 - 10.3 mg/dL   GFR calc non Af Amer >60 >60 mL/min   GFR calc Af Amer >60 >60 mL/min    Comment: (NOTE) The eGFR has been calculated using the CKD EPI equation. This calculation has not been validated in all clinical situations. eGFR's persistently <60 mL/min signify possible Chronic Kidney Disease.    Anion gap 8 5 - 15    Comment: Performed at [facility], [address] [place], [place] [address]  CBC with Differential/Platelet     Status: Abnormal   Collection Time: 12/23/17  4:46  AM  Result Value Ref Range   WBC 1.4 (LL) 3.6 - 11.0 K/uL    Comment: RESULT REPEATED AND VERIFIED CRITICAL VALUE NOTED.  VALUE IS CONSISTENT WITH PREVIOUSLY REPORTED AND CALLED VALUE.    RBC 3.18 (L) 3.80 - 5.20 MIL/uL   Hemoglobin 9.9 (L) 12.0 - 16.0 g/dL   HCT 29.2 (L) 35.0 - 47.0 %   MCV 91.9 80.0 - 100.0 fL   MCH 31.3 26.0 - 34.0 pg   MCHC 34.0 32.0 - 36.0 g/dL   RDW 21.2 (H) 11.5 - 14.5 %   Platelets 8 (LL) 150 - 440 K/uL    Comment: RESULT REPEATED AND VERIFIED CRITICAL VALUE NOTED.  VALUE IS CONSISTENT WITH PREVIOUSLY REPORTED AND CALLED VALUE.    Neutrophils Relative % 45 %   Lymphocytes Relative 4 %   Monocytes Relative 51 %   Eosinophils Relative 0 %   Basophils Relative 0 %   Neutro Abs 0.6 (L) 1.4 - 6.5 K/uL   Lymphs Abs 0.1 (L) 1.0 - 3.6 K/uL   Monocytes Absolute 0.7 0.2 - 0.9 K/uL   Eosinophils Absolute 0.0 0 - 0.7 K/uL   Basophils Absolute 0.0 0 - 0.1 K/uL   RBC Morphology POLYCHROMASIA PRESENT     Comment: MIXED RBC POPULATION Performed at [facility], [address] [place], [place] [address]   Phosphorus     Status: Abnormal   Collection Time: 12/23/17  4:46 AM  Result Value Ref Range   Phosphorus  1.9 (L) 2.5 - 4.6 mg/dL    Comment: Performed at [facility], [address] [place], [place] [address]  Glucose, capillary     Status: None   Collection Time: 12/23/17  7:46 AM  Result Value Ref Range   Glucose-Capillary 94 65 - 99 mg/dL  Glucose, capillary     Status: Abnormal   Collection Time: 12/23/17 11:46 AM  Result Value Ref Range   Glucose-Capillary 143 (H) 65 - 99 mg/dL  Prepare Pheresed Platelets     Status: None (Preliminary result)   Collection Time: 12/23/17 12:00 PM  Result Value Ref Range   Unit Number [id number]    Blood Component Type PLTPHER LR2    Unit division 00    Status of Unit ISSUED    Transfusion Status      OK TO TRANSFUSE Performed at [facility], [address] [place], [place]  [address]    No components found for: ESR, C REACTIVE PROTEIN MICRO: Recent Results (from the past 720 hour(s))  Blood Culture (routine x 2)     Status: Abnormal   Collection Time: 12/19/17  7:10 PM  Result Value Ref Range Status   Specimen Description   Final    BLOOD LEFT WRIST Performed at [facility], [address] [place], [place] [address]    Special Requests   Final    BOTTLES DRAWN AEROBIC AND ANAEROBIC Blood Culture adequate volume Performed at [facility], [address] [place], [place] [address]    Culture  Setup Time   Final    GRAM NEGATIVE RODS IN BOTH AEROBIC AND ANAEROBIC BOTTLES CRITICAL RESULT CALLED TO, READ BACK BY AND VERIFIED WITH: HANK ZOMPA AT [place] ON 12/20/17 [place]. Performed at [facility], [place] [address], [place], [place] [address]    Culture ESCHERICHIA COLI (A)  Final   Report Status 12/22/2017 FINAL  Final   Organism ID, Bacteria ESCHERICHIA COLI  Final      Susceptibility   Escherichia coli - MIC*    AMPICILLIN 8 SENSITIVE Sensitive     CEFAZOLIN <=4 SENSITIVE Sensitive     CEFEPIME <=1 SENSITIVE Sensitive     CEFTAZIDIME <=1 SENSITIVE Sensitive     CEFTRIAXONE <=1 SENSITIVE Sensitive     CIPROFLOXACIN <=0.25 SENSITIVE Sensitive     GENTAMICIN <=1 SENSITIVE Sensitive     IMIPENEM <=0.25 SENSITIVE Sensitive     TRIMETH/SULFA <=20 SENSITIVE Sensitive     AMPICILLIN/SULBACTAM <=2 SENSITIVE Sensitive     PIP/TAZO <=4 SENSITIVE Sensitive     Extended ESBL NEGATIVE Sensitive     * ESCHERICHIA COLI  Blood Culture ID Panel (Reflexed)     Status: Abnormal   Collection Time: 12/19/17  7:10 PM  Result Value Ref Range Status   Enterococcus species NOT DETECTED NOT DETECTED Final   Listeria monocytogenes NOT DETECTED NOT DETECTED Final   Staphylococcus species NOT DETECTED NOT DETECTED Final   Staphylococcus aureus NOT DETECTED NOT DETECTED Final   Streptococcus species NOT DETECTED NOT DETECTED Final   Streptococcus  agalactiae NOT DETECTED NOT DETECTED Final   Streptococcus pneumoniae NOT DETECTED NOT DETECTED Final   Streptococcus pyogenes NOT DETECTED NOT DETECTED Final   Acinetobacter baumannii NOT DETECTED NOT DETECTED Final   Enterobacteriaceae species DETECTED (A) NOT DETECTED Final    Comment: Enterobacteriaceae represent a large family of gram-negative bacteria, not a single organism. CRITICAL RESULT CALLED TO, READ BACK BY AND VERIFIED WITH: HANK ZOMPA AT 0086 ON  12/20/17 [place].    Enterobacter cloacae complex NOT DETECTED NOT DETECTED Final   Escherichia coli DETECTED (A) NOT DETECTED Final    Comment: CRITICAL RESULT CALLED TO, READ BACK BY AND VERIFIED WITH: HANK ZOMPA AT 5361 ON 12/20/17 [place].    Klebsiella oxytoca NOT DETECTED NOT DETECTED Final   Klebsiella pneumoniae NOT DETECTED NOT DETECTED Final   Proteus species NOT DETECTED NOT DETECTED Final   Serratia marcescens NOT DETECTED NOT DETECTED Final   Carbapenem resistance NOT DETECTED NOT DETECTED Final   Haemophilus influenzae NOT DETECTED NOT DETECTED Final   Neisseria meningitidis NOT DETECTED NOT DETECTED Final   Pseudomonas aeruginosa NOT DETECTED NOT DETECTED Final   Candida albicans NOT DETECTED NOT DETECTED Final   Candida glabrata NOT DETECTED NOT DETECTED Final   Candida krusei NOT DETECTED NOT DETECTED Final   Candida parapsilosis NOT DETECTED NOT DETECTED Final   Candida tropicalis NOT DETECTED NOT DETECTED Final    Comment: Performed at [facility], [address] [place], [place] [address]  Blood Culture (routine x 2)     Status: Abnormal   Collection Time: 12/19/17  7:40 PM  Result Value Ref Range Status   Specimen Description   Final    BLOOD RFOA Performed at [facility], [address], [place], [place] [address]    Special Requests   Final    BOTTLES DRAWN AEROBIC AND ANAEROBIC Blood Culture adequate volume Performed at [facility], [address] [place], [place] [address]     Culture  Setup Time   Final    GRAM NEGATIVE RODS ANAEROBIC BOTTLE ONLY CRITICAL RESULT CALLED TO, READ BACK BY AND VERIFIED WITH: HANK ZOMPA AT 0932 ON 12/20/17 [place]. Performed at [facility], [address] [place], [place] [address]    Culture (A)  Final    ESCHERICHIA COLI SUSCEPTIBILITIES PERFORMED ON PREVIOUS CULTURE WITHIN THE LAST 5 DAYS. Performed at [facility], [place] [address], [place], [place] [address]    Report Status 12/22/2017 FINAL  Final  Urine culture     Status: Abnormal   Collection Time: 12/19/17  7:40 PM  Result Value Ref Range Status   Specimen Description   Final    URINE, RANDOM Performed at [facility], [address], [place], [place] [address]    Special Requests   Final    NONE Performed at [facility], [address], [place], [place] [address]    Culture (A)  Final    <10,000 COLONIES/mL Performed at [facility], [place] [address] [place], [place] [address]    Report Status 12/21/2017 FINAL  Final  MRSA PCR Screening     Status: None   Collection Time: 12/19/17 11:11 PM  Result Value Ref Range Status   MRSA by PCR NEGATIVE NEGATIVE Final    Comment:        The GeneXpert MRSA Assay (FDA approved for NASAL specimens only), is one component of a comprehensive MRSA colonization surveillance program. It is not intended to diagnose MRSA infection nor to guide or monitor treatment for MRSA infections. Performed at [facility], [address] [place], [place] [address]   CULTURE, BLOOD (ROUTINE X 2) w Reflex to ID Panel     Status: None (Preliminary result)   Collection Time: 12/21/17  5:57 AM  Result Value Ref Range Status   Specimen Description BLOOD RIGHT HAND  Final   Special Requests   Final  BOTTLES DRAWN AEROBIC AND ANAEROBIC Blood Culture adequate volume   Culture   Final    NO GROWTH 2 DAYS Performed at [facility], [address] [place], [place] [address]     Report Status PENDING  Incomplete  CULTURE, BLOOD (ROUTINE X 2) w Reflex to ID Panel     Status: None (Preliminary result)   Collection Time: 12/21/17  5:57 AM  Result Value Ref Range Status   Specimen Description BLOOD LEFT ANTECUBITAL  Final   Special Requests   Final    BOTTLES DRAWN AEROBIC AND ANAEROBIC Blood Culture adequate volume   Culture   Final    NO GROWTH 2 DAYS Performed at [facility], [address] [place], [place] [address]    Report Status PENDING  Incomplete  C difficile quick scan w PCR reflex     Status: None   Collection Time: 12/21/17 10:24 AM  Result Value Ref Range Status   C Diff antigen NEGATIVE NEGATIVE Final   C Diff toxin NEGATIVE NEGATIVE Final   C Diff interpretation No C. difficile detected.  Final    Comment: Performed at [facility], [address] [place], [place] [address]    IMAGING: Ct Soft Tissue Neck W Contrast  Result Date: 12/09/2017 CLINICAL DATA:  72 y/o F; restaging of follicular lymphoma diagnosed 10/17 currently receiving chemotherapy. EXAM: CT NECK WITH CONTRAST TECHNIQUE: Multidetector CT imaging of the neck was performed using the standard protocol following the bolus administration of intravenous contrast. CONTRAST:  68m ISOVUE-300 IOPAMIDOL (ISOVUE-300) INJECTION 61% COMPARISON:  Concurrent CT of chest abdomen and pelvis. 09/12/2017 CT of the neck. 04/19/2017, 11/07/2016, 08/01/2016 PET-CT. FINDINGS: Pharynx and larynx: Normal. No mass or swelling. Salivary glands: No inflammation, mass, or stone. Thyroid: Normal. Lymph nodes: No lymphadenopathy of the neck by imaging criteria. Partially visualized bulky mass in the right axilla, correlation with concurrent CT of chest findings recommended. Vascular: Mild calcific atherosclerosis of aortic arch and left carotid bifurcation without significant stenosis. Limited intracranial: Negative. Visualized orbits: Negative. Mastoids and visualized paranasal sinuses: Clear.  Skeleton: Moderate cervical spondylosis with discogenic degenerative changes greatest at the C4-C6 levels and C3-4 minimal grade 1 anterolisthesis. No acute osseous abnormality identified. Upper chest: Stable scarring in right lung apex. Other: None. IMPRESSION: 1. Decreased size of neck lymph nodes. No residual lymphadenopathy of the neck. 2. Partially visualize bulky mass in right axilla, correlation with concurrent CT of chest findings recommended. Electronically Signed   By: LKristine GarbeM.D.   On: 12/09/2017 15:53   Ct Chest W Contrast  Result Date: 12/09/2017 CLINICAL DATA:  Followup lymphoma. EXAM: CT CHEST, ABDOMEN, AND PELVIS WITH CONTRAST TECHNIQUE: Multidetector CT imaging of the chest, abdomen and pelvis was performed following the standard protocol during bolus administration of intravenous contrast. CONTRAST:  844mISOVUE-300 IOPAMIDOL (ISOVUE-300) INJECTION 61% COMPARISON:  09/12/2017 FINDINGS: CT CHEST FINDINGS Cardiovascular: Normal heart size. No pericardial effusion identified. Aortic atherosclerosis. Mediastinum/Nodes: Normal thyroid gland. The trachea appears patent and midline. Normal appearance of the esophagus. Previous enlarged right paratracheal node measures 0.5 cm, image 17 of series 4. Previously 1.8 cm. There has been resolution of previous anterior mediastinal prevascular adenopathy. The index left internal mammary lymph node measures 0.7 cm, image 21 of series 4. Previously 1.6 cm. Index left axillary node has resolved in the interval. Previously noted left breast lesions have resolved in the interval. The large round axillary mass persists measuring 4.1 x 4.3 x 5.4 cm (volume = 50  cm^3). Previously 4.4 x 3.6 x 5.1 cm (volume = 42 cm^3). Lungs/Pleura: There has been resolution of previous right pleural effusion. Resolution of previous right-sided paraspinal soft tissue overlying the right upper lobe. Previous pleural nodularity overlying the right lung has resolved in  the interval. 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 No suspicious bone lesions. CT ABDOMEN PELVIS FINDINGS Hepatobiliary: No focal liver abnormality is seen. No gallstones, gallbladder wall thickening, or biliary dilatation. Pancreas: Unremarkable. No pancreatic ductal dilatation or surrounding inflammatory changes. Spleen: Normal in size without focal abnormality. Adrenals/Urinary Tract: The adrenal glands are normal. Unremarkable appearance of both kidneys. The urinary bladder appears unremarkable. Stomach/Bowel: The stomach is normal. The small bowel loops have a normal course and caliber without obstruction. Normal appearance of the colon. Vascular/Lymphatic: Aortic atherosclerosis without aneurysm. No adenopathy within the abdomen. No pelvic or inguinal adenopathy identified. Reproductive: The uterus is unremarkable.  No adnexal mass. Other: No free fluid or fluid collections identified. No peritoneal nodularity. Musculoskeletal: Previous left hip arthroplasty. There is a lucent lesion involving the left acetabulum measuring 3.6 by 2.3 cm. 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 IMPRESSION: 1. Continued interval response to therapy. When compared with 09/12/2017 there has been resolution of mediastinal, left axillary and left internal mammary adenopathy. The lesions within the right breast have also resolved in the interval. Resolution of right-sided pleural nodularity. 2. There is a persistent enlarged soft tissue mass within the right axilla which is slightly increased in volume compared with the previous exam and worrisome for residual tumor. Electronically Signed   By: Kerby Moors M.D.   On: 12/09/2017 16:58   Ct Abdomen Pelvis W Contrast  Result Date: 12/20/2017 CLINICAL DATA:  Decreased appetite.  Nausea. EXAM: CT ABDOMEN AND PELVIS WITH CONTRAST TECHNIQUE: Multidetector CT imaging of the abdomen and pelvis was performed using the  standard protocol following bolus administration of intravenous contrast. CONTRAST:  102m ISOVUE-300 IOPAMIDOL (ISOVUE-300) INJECTION 61% COMPARISON:  December 09, 2017 FINDINGS: Lower chest: New pleural effusions are identified, right greater than left, relatively small. Associated atelectasis is noted. A pectus deformity is noted in the lower chest, unchanged. No nodules, masses, or suspicious infiltrates are identified in the lower chest. Hepatobiliary: The liver is normal in appearance. The gallbladder is more distended in the interval with no obvious stones or definitive wall thickening. There is a small amount of fluid adjacent to the gallbladder which is nonspecific given mild ascites elsewhere which will be described below. The portal vein is patent. Pancreas: Unremarkable. No pancreatic ductal dilatation or surrounding inflammatory changes. Spleen: Normal in size without focal abnormality. Adrenals/Urinary Tract: Adrenal glands are normal. The kidneys, ureters, and bladder are normal. Stomach/Bowel: The stomach appears thick walled but is poorly evaluated due to lack of distention. There was no wall thickening on the December 09, 2017 study. The small bowel is normal. There appears to be rectal wall thickening as well such as on series 2, image 68 and coronal image 52. No adjacent fat stranding identified. Scattered colonic diverticuli without diverticulitis. The remainder of the colon is otherwise normal. The appendix is normal in appearance and best seen on coronal imaging. Vascular/Lymphatic: Atherosclerotic changes is seen in the nonaneurysmal aorta. No adenopathy. Reproductive: Uterus and bilateral adnexa are unremarkable. Other: Ascites in the abdomen adjacent to the liver, spleen, gallbladder, and in the pericolic gutters and pelvis. Musculoskeletal: No acute or significant osseous findings. IMPRESSION: 1. The stomach wall appears thickened. This could be due to  poor distention but gastritis is not  excluded. Recommend clinical correlation. 2. The rectal wall appears thickened as well, new in the interval, suggesting proctitis. Recommend clinical correlation. 3. New bilateral pleural effusions and ascites. 4. The gallbladder is mildly distended but there is no definitive cholelithiasis or wall thickening. The fluid adjacent to the gallbladder is likely due to the ascites. An ultrasound could better evaluate if warranted. 5. Electronically Signed   By: Dorise Bullion III M.D   On: 12/20/2017 20:07   Ct Abdomen Pelvis W Contrast  Result Date: 12/09/2017 CLINICAL DATA:  Followup lymphoma. EXAM: CT CHEST, ABDOMEN, AND PELVIS WITH CONTRAST TECHNIQUE: Multidetector CT imaging of the chest, abdomen and pelvis was performed following the standard protocol during bolus administration of intravenous contrast. CONTRAST:  2m ISOVUE-300 IOPAMIDOL (ISOVUE-300) INJECTION 61% COMPARISON:  09/12/2017 FINDINGS: CT CHEST FINDINGS Cardiovascular: Normal heart size. No pericardial effusion identified. Aortic atherosclerosis. Mediastinum/Nodes: Normal thyroid gland. The trachea appears patent and midline. Normal appearance of the esophagus. Previous enlarged right paratracheal node measures 0.5 cm, image 17 of series 4. Previously 1.8 cm. There has been resolution of previous anterior mediastinal prevascular adenopathy. The index left internal mammary lymph node measures 0.7 cm, image 21 of series 4. Previously 1.6 cm. Index left axillary node has resolved in the interval. Previously noted left breast lesions have resolved in the interval. The large round axillary mass persists measuring 4.1 x 4.3 x 5.4 cm (volume = 50 cm^3). Previously 4.4 x 3.6 x 5.1 cm (volume = 42 cm^3). Lungs/Pleura: There has been resolution of previous right pleural effusion. Resolution of previous right-sided paraspinal soft tissue overlying the right upper lobe. Previous pleural nodularity overlying the right lung has resolved in the interval.  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 No suspicious bone lesions. CT ABDOMEN PELVIS FINDINGS Hepatobiliary: No focal liver abnormality is seen. No gallstones, gallbladder wall thickening, or biliary dilatation. Pancreas: Unremarkable. No pancreatic ductal dilatation or surrounding inflammatory changes. Spleen: Normal in size without focal abnormality. Adrenals/Urinary Tract: The adrenal glands are normal. Unremarkable appearance of both kidneys. The urinary bladder appears unremarkable. Stomach/Bowel: The stomach is normal. The small bowel loops have a normal course and caliber without obstruction. Normal appearance of the colon. Vascular/Lymphatic: Aortic atherosclerosis without aneurysm. No adenopathy within the abdomen. No pelvic or inguinal adenopathy identified. Reproductive: The uterus is unremarkable.  No adnexal mass. Other: No free fluid or fluid collections identified. No peritoneal nodularity. Musculoskeletal: Previous left hip arthroplasty. There is a lucent lesion involving the left acetabulum measuring 3.6 by 2.3 cm. 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 IMPRESSION: 1. Continued interval response to therapy. When compared with 09/12/2017 there has been resolution of mediastinal, left axillary and left internal mammary adenopathy. The lesions within the right breast have also resolved in the interval. Resolution of right-sided pleural nodularity. 2. There is a persistent enlarged soft tissue mass within the right axilla which is slightly increased in volume compared with the previous exam and worrisome for residual tumor. Electronically Signed   By: TKerby MoorsM.D.   On: 12/09/2017 16:58   Dg Chest Port 1 View  Result Date: 12/19/2017 CLINICAL DATA:  Fever.  History of lymphoma. EXAM: PORTABLE CHEST 1 VIEW COMPARISON:  Chest CT 12/09/2017 and chest x-ray 11/18/2017 FINDINGS: The right IJ Port-A-Cath is stable. The heart is normal  in size. Stable tortuosity and calcification of the thoracic aorta. The lungs are clear. No pulmonary lesions or pleural effusion. Stable hazy  right upper lobe scarring type changes. The bony thorax is intact. IMPRESSION: No acute pulmonary findings. Stable right upper lobe scarring type changes. Electronically Signed   By: Marijo Sanes M.D.   On: 12/19/2017 19:31    Assessment:   Veronica Boyd is a 72 y.o. 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 Improving with IV abx. CT shows probably proctitis as source of bacteremia.    Recommendations Would continue IV cefazolin while inpatient.  At time of dc can send home on oral keflex for a total 14 day course. If fevers recur broaden coverage to include other enteric pathogens. Portacath infections with gram neg rods are unusual so would not remove the cath at this time. Thank you very much for allowing me to participate in the care of this patient. Please call with questions.   Cheral Marker. Ola Spurr, MD

## 2017-12-23 NOTE — Progress Notes (Signed)
Patient ID: Veronica Boyd, female   DOB: 07/22/1946, 72 y.o.   MRN: [id number]  [facility] PROGRESS NOTE  REMI RESTER DVV:[id number] DOB: 1946/04/14 DOA: 12/19/2017 PCP: Glendon Axe, MD  HPI/Subjective: Patient feeling a little bit better today.  Complains of the room being cold.  Able to eat a little bit more today.  Feels very weak.  Objective: Vitals:   12/23/17 1420 12/23/17 1445  BP: 114/64 110/65  Pulse: (!) 111 (!) 109  Resp: (!) 23 (!) 22  Temp: 98.6 F (37 C) 98.5 F (36.9 C)  SpO2: 96% 97%    Filed Weights   12/19/17 1839 12/20/17 0500 12/22/17 0356  Weight: 42.6 kg (94 lb) 45.5 kg (100 lb 6.4 oz) 45.7 kg (100 lb 12 oz)    ROS: Review of Systems  Constitutional: Negative for chills and fever.  Eyes: Negative for blurred vision.  Respiratory: Negative for cough and shortness of breath.   Cardiovascular: Negative for chest pain.  Gastrointestinal: Positive for diarrhea and nausea. Negative for abdominal pain, constipation and vomiting.  Genitourinary: Negative for dysuria.  Musculoskeletal: Negative for joint pain.  Neurological: Negative for dizziness and headaches.   Exam: Physical Exam  Constitutional: She is oriented to person, place, and time.  HENT:  Nose: No mucosal edema.  Mouth/Throat: No oropharyngeal exudate or posterior oropharyngeal edema.  Eyes: Conjunctivae, EOM and lids are normal. Pupils are equal, round, and reactive to light.  Neck: No JVD present. Carotid bruit is not present. No edema present. No thyroid mass and no thyromegaly present.  Cardiovascular: S1 normal and S2 normal. Exam reveals no gallop.  No murmur heard. Pulses:      Dorsalis pedis pulses are 2+ on the right side, and 2+ on the left side.  Respiratory: No respiratory distress. She has decreased breath sounds in the right lower field and the left lower field. She has no wheezes. She has no rhonchi. She has no rales.  GI: Soft. Bowel sounds are normal. There is  no tenderness.  Musculoskeletal:       Right ankle: She exhibits no swelling.       Left ankle: She exhibits no swelling.  Lymphadenopathy:    She has no cervical adenopathy.  Neurological: She is alert and oriented to person, place, and time. No cranial nerve deficit.  Skin: Skin is warm. No rash noted. Nails show no clubbing.  Psychiatric: She has a normal mood and affect.      Data Reviewed: Basic Metabolic Panel: Recent Labs  Lab 12/20/17 0923 12/20/17 2348 12/21/17 0549 12/21/17 1242 12/22/17 0343 12/22/17 1600 12/23/17 0446  NA 131* 131* 133*  --  132*  --  131*  K 3.5 2.4* 3.5 3.1* 2.9*  2.9* 4.3 3.7  CL 102 101 103  --  100*  --  98*  CO2 22 22 21*  --  25  --  25  GLUCOSE 140* 88 95  --  83  --  83  BUN 12 7 <5*  --  5*  --  7  CREATININE 0.49 0.35* 0.38*  --  0.33*  --  0.43*  CALCIUM 7.8* 7.6* 7.6*  --  7.9*  --  8.2*  MG 2.3 1.7 1.9  --  1.8 1.8  --   PHOS 3.6 1.7* 2.6  --   --  1.4* 1.9*   Liver Function Tests: Recent Labs  Lab 12/19/17 1910 12/20/17 2348  AST 20 15  ALT 11* 10*  ALKPHOS  34* 33*  BILITOT 0.8 1.8*  PROT 5.7* 4.8*  ALBUMIN 2.9* 2.4*    CBC: Recent Labs  Lab 12/19/17 1910 12/20/17 0923 12/20/17 2348 12/21/17 0549 12/23/17 0446  WBC 0.1* 0.1* 0.1* 0.2* 1.4*  NEUTROABS 0.0*  --   --  0.0* 0.6*  HGB 7.0* 8.4* 9.7* 8.8* 9.9*  HCT 20.7* 24.9* 27.2* 25.4* 29.2*  MCV 102.9* 89.8 89.5 90.1 91.9  PLT 6* 47* 27* 21* 8*   Cardiac Enzymes: Recent Labs  Lab 12/20/17 2348 12/21/17 0549 12/21/17 1242  TROPONINI 0.07* 0.06* 0.05*    CBG: Recent Labs  Lab 12/20/17 0912 12/21/17 0723 12/22/17 0758 12/23/17 0746 12/23/17 1146  GLUCAP 135* 105* 86 94 143*    Recent Results (from the past 240 hour(s))  Blood Culture (routine x 2)     Status: Abnormal   Collection Time: 12/19/17  7:10 PM  Result Value Ref Range Status   Specimen Description   Final    BLOOD LEFT WRIST Performed at [facility], [name] [place], [place] [address]    Special Requests   Final    BOTTLES DRAWN AEROBIC AND ANAEROBIC Blood Culture adequate volume Performed at [facility], [address] [place], [place] [address]    Culture  Setup Time   Final    GRAM NEGATIVE RODS IN BOTH AEROBIC AND ANAEROBIC BOTTLES CRITICAL RESULT CALLED TO, READ BACK BY AND VERIFIED WITH: HANK ZOMPA AT 2952 ON 12/20/17 [place]. Performed at [facility], [place] [address] [place], [place] [address]    Culture ESCHERICHIA COLI (A)  Final   Report Status 12/22/2017 FINAL  Final   Organism ID, Bacteria ESCHERICHIA COLI  Final      Susceptibility   Escherichia coli - MIC*    AMPICILLIN 8 SENSITIVE Sensitive     CEFAZOLIN <=4 SENSITIVE Sensitive     CEFEPIME <=1 SENSITIVE Sensitive     CEFTAZIDIME <=1 SENSITIVE Sensitive     CEFTRIAXONE <=1 SENSITIVE Sensitive     CIPROFLOXACIN <=0.25 SENSITIVE Sensitive     GENTAMICIN <=1 SENSITIVE Sensitive     IMIPENEM <=0.25 SENSITIVE Sensitive     TRIMETH/SULFA <=20 SENSITIVE Sensitive     AMPICILLIN/SULBACTAM <=2 SENSITIVE Sensitive     PIP/TAZO <=4 SENSITIVE Sensitive     Extended ESBL NEGATIVE Sensitive     * ESCHERICHIA COLI  Blood Culture ID Panel (Reflexed)     Status: Abnormal   Collection Time: 12/19/17  7:10 PM  Result Value Ref Range Status   Enterococcus species NOT DETECTED NOT DETECTED Final   Listeria monocytogenes NOT DETECTED NOT DETECTED Final   Staphylococcus species NOT DETECTED NOT DETECTED Final   Staphylococcus aureus NOT DETECTED NOT DETECTED Final   Streptococcus species NOT DETECTED NOT DETECTED Final   Streptococcus agalactiae NOT DETECTED NOT DETECTED Final   Streptococcus pneumoniae NOT DETECTED NOT DETECTED Final   Streptococcus pyogenes NOT DETECTED NOT DETECTED Final   Acinetobacter baumannii NOT DETECTED NOT DETECTED Final   Enterobacteriaceae species DETECTED (A) NOT DETECTED Final    Comment: Enterobacteriaceae represent a large family of  gram-negative bacteria, not a single organism. CRITICAL RESULT CALLED TO, READ BACK BY AND VERIFIED WITH: HANK ZOMPA AT 4401 ON 12/20/17 [place].    Enterobacter cloacae complex NOT DETECTED NOT DETECTED Final   Escherichia coli DETECTED (A) NOT DETECTED Final    Comment: CRITICAL RESULT CALLED TO, READ BACK BY AND VERIFIED WITH: HANK ZOMPA AT 0272 ON 12/20/17 [place].  Klebsiella oxytoca NOT DETECTED NOT DETECTED Final   Klebsiella pneumoniae NOT DETECTED NOT DETECTED Final   Proteus species NOT DETECTED NOT DETECTED Final   Serratia marcescens NOT DETECTED NOT DETECTED Final   Carbapenem resistance NOT DETECTED NOT DETECTED Final   Haemophilus influenzae NOT DETECTED NOT DETECTED Final   Neisseria meningitidis NOT DETECTED NOT DETECTED Final   Pseudomonas aeruginosa NOT DETECTED NOT DETECTED Final   Candida albicans NOT DETECTED NOT DETECTED Final   Candida glabrata NOT DETECTED NOT DETECTED Final   Candida krusei NOT DETECTED NOT DETECTED Final   Candida parapsilosis NOT DETECTED NOT DETECTED Final   Candida tropicalis NOT DETECTED NOT DETECTED Final    Comment: Performed at [facility], [address] [place], [place] [address]  Blood Culture (routine x 2)     Status: Abnormal   Collection Time: 12/19/17  7:40 PM  Result Value Ref Range Status   Specimen Description   Final    BLOOD RFOA Performed at [facility], [address], [place], [place] [address]    Special Requests   Final    BOTTLES DRAWN AEROBIC AND ANAEROBIC Blood Culture adequate volume Performed at [facility], [address] [place], [place] [address]    Culture  Setup Time   Final    GRAM NEGATIVE RODS ANAEROBIC BOTTLE ONLY CRITICAL RESULT CALLED TO, READ BACK BY AND VERIFIED WITH: HANK ZOMPA AT 2951 ON 12/20/17 [place]. Performed at [facility], [address] [place], [place] [address]    Culture (A)  Final    ESCHERICHIA COLI SUSCEPTIBILITIES PERFORMED ON PREVIOUS  CULTURE WITHIN THE LAST 5 DAYS. Performed at [facility], [place] [address], [place], [place] [address]    Report Status 12/22/2017 FINAL  Final  Urine culture     Status: Abnormal   Collection Time: 12/19/17  7:40 PM  Result Value Ref Range Status   Specimen Description   Final    URINE, RANDOM Performed at [facility], [address] [place], [place] [address]    Special Requests   Final    NONE Performed at [facility], [address] [place], [place] [address]    Culture (A)  Final    <10,000 COLONIES/mL Performed at [facility], [place] [address], [place], [place] [address]    Report Status 12/21/2017 FINAL  Final  MRSA PCR Screening     Status: None   Collection Time: 12/19/17 11:11 PM  Result Value Ref Range Status   MRSA by PCR NEGATIVE NEGATIVE Final    Comment:        The GeneXpert MRSA Assay (FDA approved for NASAL specimens only), is one component of a comprehensive MRSA colonization surveillance program. It is not intended to diagnose MRSA infection nor to guide or monitor treatment for MRSA infections. Performed at [facility], [address] [place], [place] [address]   CULTURE, BLOOD (ROUTINE X 2) w Reflex to ID Panel     Status: None (Preliminary result)   Collection Time: 12/21/17  5:57 AM  Result Value Ref Range Status   Specimen Description BLOOD RIGHT HAND  Final   Special Requests   Final    BOTTLES DRAWN AEROBIC AND ANAEROBIC Blood Culture adequate volume   Culture   Final    NO GROWTH 2 DAYS Performed at [facility], [address] [place],  [address]    Report Status PENDING  Incomplete  CULTURE, BLOOD (  ROUTINE X 2) w Reflex to ID Panel     Status: None (Preliminary result)   Collection Time: 12/21/17  5:57 AM  Result Value Ref Range Status   Specimen Description BLOOD LEFT ANTECUBITAL  Final   Special Requests   Final    BOTTLES DRAWN AEROBIC AND ANAEROBIC Blood Culture  adequate volume   Culture   Final    NO GROWTH 2 DAYS Performed at [facility], [address] [place], [place] [address]    Report Status PENDING  Incomplete  C difficile quick scan w PCR reflex     Status: None   Collection Time: 12/21/17 10:24 AM  Result Value Ref Range Status   C Diff antigen NEGATIVE NEGATIVE Final   C Diff toxin NEGATIVE NEGATIVE Final   C Diff interpretation No C. difficile detected.  Final    Comment: Performed at [facility], [address] [place], [place] [address]     Studies: No results found.  Scheduled Meds: . dronabinol  5 mg Oral BID AC  . feeding supplement  1 Container Oral TID BM  . multivitamin with minerals  1 tablet Oral Daily  . pantoprazole (PROTONIX) IV  40 mg Intravenous Q24H  . potassium & sodium phosphates  1 packet Oral TID AC & HS  . simvastatin  20 mg Oral QHS   Continuous Infusions: .  ceFAZolin (ANCEF) IV Stopped (12/23/17 0715)    Assessment/Plan:  1. E. coli sepsis and neutropenic fever.  Patient switched to Ancef.  Source could be rectal inflammation seen on CT scan yesterday.  Check CBC with differential tomorrow.  White blood cell count up to 1.4 today with 45% neutrophils. 2. Recurrent follicular lymphoma and pancytopenia.  Patient is status post platelet and packed red blood cell transfusion.  Patient's platelet count today is 8000 and I will transfuse another unit of platelets.  Patient's hemoglobin is 9.9. 3. Hypokalemia and hypomagnesemia.  These have been replaced. 4. Hypophosphatemia.  On Neutra-Phos 5. Nausea vomiting.  Seems a little bit better today.  PRN medications.  Patient started on Marinol 6. Diarrhea.  Started on Imodium 7. Hyperlipidemia unspecified on simvastatin 8. Hyponatremia.  Sodium down to 131 today.   Code Status:     Code Status Orders  (From admission, onward)        Start     Ordered   12/19/17 2244  Full code  Continuous     12/19/17 2243    Code Status  History    Date Active Date Inactive Code Status Order ID Comments User Context   This patient has a current code status but no historical code status.    Advance Directive Documentation     Most Recent Value  Type of Advance Directive  Living will  Pre-existing out of facility DNR order (yellow form or pink MOST form)  No data  "MOST" Form in Place?  No data     Family Communication: Daughter at bedside Disposition Plan: To be determined  Consultants:  Critical care specialist  Oncology  Infectious disease  Antibiotics:  Ancef  Time spent: 27 minutes, case discussed with oncology about platelet transfusion.  Case discussed with infectious disease specialist.  [facility]

## 2017-12-23 NOTE — Progress Notes (Signed)
PT Cancellation Note  Patient Details Name: Veronica Boyd MRN: [id number] DOB: 1946/05/20   Cancelled Treatment:    Reason Eval/Treat Not Completed: Patient not medically ready. Consult received and chart reviewed. Pt with low Platelets at 8. PT is contraindicated if less than 20. Will hold at this time and re-attempt when medically stable.   Daniela Siebers 12/23/2017, 10:23 AM  Greggory Stallion, PT, DPT [phone]

## 2017-12-23 NOTE — Progress Notes (Signed)
[place]  [phone] [phone]  ID: Veronica Boyd OB: [birth year]  MR#: [id number]  XIP#:[id number]  Patient Care Team: Glendon Axe, MD as PCP - General (Internal Medicine) Leonie Green, MD as Referring Physician (Surgery)  CHIEF COMPLAINT: Neutropenic fever, E. coli sepsis, follicular lymphoma.  INTERVAL HISTORY: Patient still significantly weak and fatigued with a poor appetite, but states this is improving.  She is no longer febrile.  She remains neutropenic, but this is improving.  She still continues to have a significant thrombocytopenia.  REVIEW OF SYSTEMS:   Review of Systems  Constitutional: Positive for malaise/fatigue. Negative for fever and weight loss.  Respiratory: Negative.  Negative for cough, hemoptysis and shortness of breath.   Cardiovascular: Negative.  Negative for chest pain and leg swelling.  Gastrointestinal: Negative.  Negative for abdominal pain, blood in stool and melena.  Genitourinary: Negative.   Musculoskeletal: Negative.   Skin: Negative.  Negative for rash.  Neurological: Positive for weakness. Negative for sensory change.  Psychiatric/Behavioral: Negative.  The patient is not nervous/anxious.     As per HPI. Otherwise, a complete review of systems is negative.  PAST MEDICAL HISTORY: Past Medical History:  Diagnosis Date  . Arthritis   . Cataract    bilateral  . Chicken pox   . Colon polyp   . 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 Hyperlipidemia   . Osteoporosis     PAST SURGICAL HISTORY: Past Surgical History:  Procedure Laterality Date  . AXILLARY LYMPH NODE DISSECTION Right 08/21/2016   Procedure: AXILLARY LYMPH NODE excision;  Surgeon: Leonie Green, MD;  Location: [facility];  Service: General;  Laterality: Right;  . CATARACT EXTRACTION, BILATERAL Bilateral   . JOINT REPLACEMENT Left   . PORTA CATH INSERTION N/A 09/16/2017   Procedure: PORTA CATH  INSERTION;  Surgeon: Algernon Huxley, MD;  Location: [facility];  Service: Cardiovascular;  Laterality: N/A;  . TONSILLECTOMY    . TOTAL HIP ARTHROPLASTY Left 1992    FAMILY HISTORY: Family History  Problem Relation Age of Onset  . Diabetes Sister   . Lung cancer Brother   . Diabetes Brother   . Basal cell carcinoma Daughter   . Breast cancer Paternal Aunt     ADVANCED DIRECTIVES (Y/N):  @ADVDIR @  HEALTH MAINTENANCE: Social History   Tobacco Use  . Smoking status: Never Smoker  . Smokeless tobacco: Never Used  Substance Use Topics  . Alcohol use: No  . Drug use: No     Colonoscopy:  PAP:  Bone density:  Lipid panel:  No Known Allergies  Current Facility-Administered Medications  Medication Dose Route Frequency Provider Last Rate Last Dose  . 0.9 %  sodium chloride infusion   Intravenous Once Loletha Grayer, MD      . acetaminophen (TYLENOL) tablet 650 mg  650 mg Oral Q6H PRN Amelia Jo, MD       Or  . acetaminophen (TYLENOL) suppository 650 mg  650 mg Rectal Q6H PRN Amelia Jo, MD      . albuterol (PROVENTIL) (2.5 MG/3ML) 0.083% nebulizer solution 2.5 mg  2.5 mg Inhalation Q6H PRN Amelia Jo, MD      . bisacodyl (DULCOLAX) EC tablet 5 mg  5 mg Oral Daily PRN Amelia Jo, MD      . ceFAZolin (ANCEF) IVPB 2g/100 mL premix  2 g Intravenous Q8H Lenis Noon, [facility]   Stopped at 12/23/17 0715  . dronabinol (MARINOL)  capsule 5 mg  5 mg Oral BID AC Lafayette Dragon, MD   5 mg at 12/22/17 1623  . feeding supplement (BOOST / RESOURCE BREEZE) liquid 1 Container  1 Container Oral TID BM Loletha Grayer, MD   1 Container at 12/23/17 1058  . HYDROcodone-acetaminophen (NORCO/VICODIN) 5-325 MG per tablet 1-2 tablet  1-2 tablet Oral Q4H PRN Amelia Jo, MD   1 tablet at 12/20/17 [phone]  . lip balm (BLISTEX) ointment   Topical PRN Lafayette Dragon, MD      . liver oil-zinc oxide (DESITIN) 40 % ointment   Topical PRN Dorene Sorrow S, NP      . loperamide  (IMODIUM) capsule 2 mg  2 mg Oral PRN Lafayette Dragon, MD   2 mg at 12/22/17 1623  . magnesium oxide (MAG-OX) tablet 400 mg  400 mg Oral BID Lafayette Dragon, MD   400 mg at 12/23/17 1056  . metoprolol tartrate (LOPRESSOR) injection 2.5 mg  2.5 mg Intravenous Q6H PRN Salary, Montell D, MD   2.5 mg at 12/22/17 1848  . multivitamin with minerals tablet 1 tablet  1 tablet Oral Daily Amelia Jo, MD   1 tablet at 12/23/17 1055  . ondansetron (ZOFRAN) tablet 4 mg  4 mg Oral Q6H PRN Amelia Jo, MD       Or  . ondansetron [facility]) injection 4 mg  4 mg Intravenous Q6H PRN Amelia Jo, MD   4 mg at 12/22/17 1623  . pantoprazole (PROTONIX) injection 40 mg  40 mg Intravenous Q24H Dorene Sorrow S, NP   40 mg at 12/22/17 2056  . potassium & sodium phosphates (PHOS-NAK) 280-160-250 MG packet 1 packet  1 packet Oral TID AC & HS Lafayette Dragon, MD   1 packet at 12/23/17 1056  . prochlorperazine (COMPAZINE) tablet 10 mg  10 mg Oral Q6H PRN Amelia Jo, MD      . promethazine (PHENERGAN) injection 6.25 mg  6.25 mg Intravenous Q6H PRN Lafayette Dragon, MD   6.25 mg at 12/20/17 1550  . simvastatin (ZOCOR) tablet 20 mg  20 mg Oral QHS Amelia Jo, MD   20 mg at 12/22/17 2055  . traZODone (DESYREL) tablet 25 mg  25 mg Oral QHS PRN Amelia Jo, MD   25 mg at 12/21/17 2130   Facility-Administered Medications Ordered in Other Encounters  Medication Dose Route Frequency Provider Last Rate Last Dose  . heparin lock flush 100 unit/mL  500 Units Intravenous Once Lloyd Huger, MD      . sodium chloride flush (NS) 0.9 % injection 10 mL  10 mL Intravenous PRN Lloyd Huger, MD   10 mL at 12/04/17 0901  . yttrium-90 injection 85.0 millicurie  27.7 millicurie Intravenous Once [name], MD        OBJECTIVE: Vitals:   12/23/17 0347 12/23/17 1150  BP: 135/74 116/61  Pulse: (!) 101 (!) 104  Resp: 15 16  Temp: 98.2 F (36.8 C) 98.6 F (37 C)  SpO2: 97% 95%     Body mass index is  20.35 kg/m.    ECOG FS:2 - Symptomatic, <50% confined to bed  General: Thin, ill-appearing, no acute distress.  Lying in bed. Eyes: Pink conjunctiva, anicteric sclera. Lungs: Clear to auscultation bilaterally. Heart: Regular rate and rhythm. No rubs, murmurs, or gallops. Abdomen: Soft, nontender, nondistended. No organomegaly noted, normoactive bowel sounds. Musculoskeletal: No edema, cyanosis, or clubbing. Neuro: Alert, answering all questions appropriately. Cranial nerves grossly intact. Skin: No  rashes or petechiae noted. Psych: Normal affect.  LAB RESULTS:  Lab Results  Component Value Date   NA 131 (L) 12/23/2017   K 3.7 12/23/2017   CL 98 (L) 12/23/2017   CO2 25 12/23/2017   GLUCOSE 83 12/23/2017   BUN 7 12/23/2017   CREATININE 0.43 (L) 12/23/2017   CALCIUM 8.2 (L) 12/23/2017   PROT 4.8 (L) 12/20/2017   ALBUMIN 2.4 (L) 12/20/2017   AST 15 12/20/2017   ALT 10 (L) 12/20/2017   ALKPHOS 33 (L) 12/20/2017   BILITOT 1.8 (H) 12/20/2017   GFRNONAA >60 12/23/2017   GFRAA >60 12/23/2017    Lab Results  Component Value Date   WBC 1.4 (LL) 12/23/2017   NEUTROABS 0.6 (L) 12/23/2017   HGB 9.9 (L) 12/23/2017   HCT 29.2 (L) 12/23/2017   MCV 91.9 12/23/2017   PLT 8 (LL) 12/23/2017     STUDIES: Ct Soft Tissue Neck W Contrast  Result Date: 12/09/2017 CLINICAL DATA:  72 y/o F; restaging of follicular lymphoma diagnosed 10/17 currently receiving chemotherapy. EXAM: CT NECK WITH CONTRAST TECHNIQUE: Multidetector CT imaging of the neck was performed using the standard protocol following the bolus administration of intravenous contrast. CONTRAST:  6mL ISOVUE-300 IOPAMIDOL (ISOVUE-300) INJECTION 61% COMPARISON:  Concurrent CT of chest abdomen and pelvis. 09/12/2017 CT of the neck. 04/19/2017, 11/07/2016, 08/01/2016 PET-CT. FINDINGS: Pharynx and larynx: Normal. No mass or swelling. Salivary glands: No inflammation, mass, or stone. Thyroid: Normal. Lymph nodes: No lymphadenopathy of the  neck by imaging criteria. Partially visualized bulky mass in the right axilla, correlation with concurrent CT of chest findings recommended. Vascular: Mild calcific atherosclerosis of aortic arch and left carotid bifurcation without significant stenosis. Limited intracranial: Negative. Visualized orbits: Negative. Mastoids and visualized paranasal sinuses: Clear. Skeleton: Moderate cervical spondylosis with discogenic degenerative changes greatest at the C4-C6 levels and C3-4 minimal grade 1 anterolisthesis. No acute osseous abnormality identified. Upper chest: Stable scarring in right lung apex. Other: None. IMPRESSION: 1. Decreased size of neck lymph nodes. No residual lymphadenopathy of the neck. 2. Partially visualize bulky mass in right axilla, correlation with concurrent CT of chest findings recommended. Electronically Signed   By: Kristine Garbe M.D.   On: 12/09/2017 15:53   Ct Chest W Contrast  Result Date: 12/09/2017 CLINICAL DATA:  Followup lymphoma. EXAM: CT CHEST, ABDOMEN, AND PELVIS WITH CONTRAST TECHNIQUE: Multidetector CT imaging of the chest, abdomen and pelvis was performed following the standard protocol during bolus administration of intravenous contrast. CONTRAST:  2mL ISOVUE-300 IOPAMIDOL (ISOVUE-300) INJECTION 61% COMPARISON:  09/12/2017 FINDINGS: CT CHEST FINDINGS Cardiovascular: Normal heart size. No pericardial effusion identified. Aortic atherosclerosis. Mediastinum/Nodes: Normal thyroid gland. The trachea appears patent and midline. Normal appearance of the esophagus. Previous enlarged right paratracheal node measures 0.5 cm, image 17 of series 4. Previously 1.8 cm. There has been resolution of previous anterior mediastinal prevascular adenopathy. The index left internal mammary lymph node measures 0.7 cm, image 21 of series 4. Previously 1.6 cm. Index left axillary node has resolved in the interval. Previously noted left breast lesions have resolved in the interval. The  large round axillary mass persists measuring 4.1 x 4.3 x 5.4 cm (volume = 50 cm^3). Previously 4.4 x 3.6 x 5.1 cm (volume = 42 cm^3). Lungs/Pleura: There has been resolution of previous right pleural effusion. Resolution of previous right-sided paraspinal soft tissue overlying the right upper lobe. Previous pleural nodularity overlying the right lung has resolved in the interval. 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  No suspicious bone lesions. CT ABDOMEN PELVIS FINDINGS Hepatobiliary: No focal liver abnormality is seen. No gallstones, gallbladder wall thickening, or biliary dilatation. Pancreas: Unremarkable. No pancreatic ductal dilatation or surrounding inflammatory changes. Spleen: Normal in size without focal abnormality. Adrenals/Urinary Tract: The adrenal glands are normal. Unremarkable appearance of both kidneys. The urinary bladder appears unremarkable. Stomach/Bowel: The stomach is normal. The small bowel loops have a normal course and caliber without obstruction. Normal appearance of the colon. Vascular/Lymphatic: Aortic atherosclerosis without aneurysm. No adenopathy within the abdomen. No pelvic or inguinal adenopathy identified. Reproductive: The uterus is unremarkable.  No adnexal mass. Other: No free fluid or fluid collections identified. No peritoneal nodularity. Musculoskeletal: Previous left hip arthroplasty. There is a lucent lesion involving the left acetabulum measuring 3.6 by 2.3 cm. 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 IMPRESSION: 1. Continued interval response to therapy. When compared with 09/12/2017 there has been resolution of mediastinal, left axillary and left internal mammary adenopathy. The lesions within the right breast have also resolved in the interval. Resolution of right-sided pleural nodularity. 2. There is a persistent enlarged soft tissue mass within the right axilla which is slightly increased in volume  compared with the previous exam and worrisome for residual tumor. Electronically Signed   By: Kerby Moors M.D.   On: 12/09/2017 16:58   Ct Abdomen Pelvis W Contrast  Result Date: 12/20/2017 CLINICAL DATA:  Decreased appetite.  Nausea. EXAM: CT ABDOMEN AND PELVIS WITH CONTRAST TECHNIQUE: Multidetector CT imaging of the abdomen and pelvis was performed using the standard protocol following bolus administration of intravenous contrast. CONTRAST:  56mL ISOVUE-300 IOPAMIDOL (ISOVUE-300) INJECTION 61% COMPARISON:  December 09, 2017 FINDINGS: Lower chest: New pleural effusions are identified, right greater than left, relatively small. Associated atelectasis is noted. A pectus deformity is noted in the lower chest, unchanged. No nodules, masses, or suspicious infiltrates are identified in the lower chest. Hepatobiliary: The liver is normal in appearance. The gallbladder is more distended in the interval with no obvious stones or definitive wall thickening. There is a small amount of fluid adjacent to the gallbladder which is nonspecific given mild ascites elsewhere which will be described below. The portal vein is patent. Pancreas: Unremarkable. No pancreatic ductal dilatation or surrounding inflammatory changes. Spleen: Normal in size without focal abnormality. Adrenals/Urinary Tract: Adrenal glands are normal. The kidneys, ureters, and bladder are normal. Stomach/Bowel: The stomach appears thick walled but is poorly evaluated due to lack of distention. There was no wall thickening on the December 09, 2017 study. The small bowel is normal. There appears to be rectal wall thickening as well such as on series 2, image 68 and coronal image 52. No adjacent fat stranding identified. Scattered colonic diverticuli without diverticulitis. The remainder of the colon is otherwise normal. The appendix is normal in appearance and best seen on coronal imaging. Vascular/Lymphatic: Atherosclerotic changes is seen in the  nonaneurysmal aorta. No adenopathy. Reproductive: Uterus and bilateral adnexa are unremarkable. Other: Ascites in the abdomen adjacent to the liver, spleen, gallbladder, and in the pericolic gutters and pelvis. Musculoskeletal: No acute or significant osseous findings. IMPRESSION: 1. The stomach wall appears thickened. This could be due to poor distention but gastritis is not excluded. Recommend clinical correlation. 2. The rectal wall appears thickened as well, new in the interval, suggesting proctitis. Recommend clinical correlation. 3. New bilateral pleural effusions and ascites. 4. The gallbladder is mildly distended but there is no definitive cholelithiasis or wall thickening. The fluid adjacent to the gallbladder  is likely due to the ascites. An ultrasound could better evaluate if warranted. 5. Electronically Signed   By: Dorise Bullion III M.D   On: 12/20/2017 20:07   Ct Abdomen Pelvis W Contrast  Result Date: 12/09/2017 CLINICAL DATA:  Followup lymphoma. EXAM: CT CHEST, ABDOMEN, AND PELVIS WITH CONTRAST TECHNIQUE: Multidetector CT imaging of the chest, abdomen and pelvis was performed following the standard protocol during bolus administration of intravenous contrast. CONTRAST:  1mL ISOVUE-300 IOPAMIDOL (ISOVUE-300) INJECTION 61% COMPARISON:  09/12/2017 FINDINGS: CT CHEST FINDINGS Cardiovascular: Normal heart size. No pericardial effusion identified. Aortic atherosclerosis. Mediastinum/Nodes: Normal thyroid gland. The trachea appears patent and midline. Normal appearance of the esophagus. Previous enlarged right paratracheal node measures 0.5 cm, image 17 of series 4. Previously 1.8 cm. There has been resolution of previous anterior mediastinal prevascular adenopathy. The index left internal mammary lymph node measures 0.7 cm, image 21 of series 4. Previously 1.6 cm. Index left axillary node has resolved in the interval. Previously noted left breast lesions have resolved in the interval. The large round  axillary mass persists measuring 4.1 x 4.3 x 5.4 cm (volume = 50 cm^3). Previously 4.4 x 3.6 x 5.1 cm (volume = 42 cm^3). Lungs/Pleura: There has been resolution of previous right pleural effusion. Resolution of previous right-sided paraspinal soft tissue overlying the right upper lobe. Previous pleural nodularity overlying the right lung has resolved in the interval. 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 No suspicious bone lesions. CT ABDOMEN PELVIS FINDINGS Hepatobiliary: No focal liver abnormality is seen. No gallstones, gallbladder wall thickening, or biliary dilatation. Pancreas: Unremarkable. No pancreatic ductal dilatation or surrounding inflammatory changes. Spleen: Normal in size without focal abnormality. Adrenals/Urinary Tract: The adrenal glands are normal. Unremarkable appearance of both kidneys. The urinary bladder appears unremarkable. Stomach/Bowel: The stomach is normal. The small bowel loops have a normal course and caliber without obstruction. Normal appearance of the colon. Vascular/Lymphatic: Aortic atherosclerosis without aneurysm. No adenopathy within the abdomen. No pelvic or inguinal adenopathy identified. Reproductive: The uterus is unremarkable.  No adnexal mass. Other: No free fluid or fluid collections identified. No peritoneal nodularity. Musculoskeletal: Previous left hip arthroplasty. There is a lucent lesion involving the left acetabulum measuring 3.6 by 2.3 cm. 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 IMPRESSION: 1. Continued interval response to therapy. When compared with 09/12/2017 there has been resolution of mediastinal, left axillary and left internal mammary adenopathy. The lesions within the right breast have also resolved in the interval. Resolution of right-sided pleural nodularity. 2. There is a persistent enlarged soft tissue mass within the right axilla which is slightly increased in volume compared with the  previous exam and worrisome for residual tumor. Electronically Signed   By: Kerby Moors M.D.   On: 12/09/2017 16:58   Dg Chest Port 1 View  Result Date: 12/19/2017 CLINICAL DATA:  Fever.  History of lymphoma. EXAM: PORTABLE CHEST 1 VIEW COMPARISON:  Chest CT 12/09/2017 and chest x-ray 11/18/2017 FINDINGS: The right IJ Port-A-Cath is stable. The heart is normal in size. Stable tortuosity and calcification of the thoracic aorta. The lungs are clear. No pulmonary lesions or pleural effusion. Stable hazy right upper lobe scarring type changes. The bony thorax is intact. IMPRESSION: No acute pulmonary findings. Stable right upper lobe scarring type changes. Electronically Signed   By: Marijo Sanes M.D.   On: 12/19/2017 19:31    ASSESSMENT: Neutropenic fever, E. coli sepsis, follicular lymphoma.  PLAN:    1.  Neutropenic  fever/E. coli sepsis: Patient now afebrile.  Continue current antibiotics.  Patient still neutropenic, but ANC is improving and now 0.6.  Patient recently received Neulasta.  Continue to monitor daily CBC. 2.  Follicular lymphoma: Patient receiving R [facility] chemotherapy with her last treatment approximately 2 weeks ago.  Disease burden improving based on last imaging.  Patient has been instructed to keep her previously scheduled follow-up appointments. 3.  Thrombocytopenia: Case discussed with hospitalist. Proceed with platelet transfusion today. 4.  Anemia: Hemoglobin stable, no need for transfusion at this time.  Will follow.   Cancer Staging Follicular lymphoma ([place]) Staging form: Lymphoid Neoplasms, AJCC 6th Edition - Clinical stage from 08/27/2016: Stage IIE (lymphoma only) - Signed by Lloyd Huger, MD on 08/27/2016   Lloyd Huger, MD   12/23/2017 1:44 PM

## 2017-12-23 NOTE — Care Management Important Message (Signed)
Important Message  Patient Details  Name: Veronica Boyd MRN: [id number] Date of Birth: August 20, 1946   Medicare Important Message Given:  Yes    Shelbie Ammons, RN 12/23/2017, 6:44 AM

## 2017-12-24 LAB — CBC WITH DIFFERENTIAL/PLATELET
BASOS PCT: 0 %
Basophils Absolute: 0 10*3/uL (ref 0–0.1)
EOS PCT: 0 %
Eosinophils Absolute: 0 10*3/uL (ref 0–0.7)
HEMATOCRIT: 28.3 % — AB (ref 35.0–47.0)
Hemoglobin: 9.7 g/dL — ABNORMAL LOW (ref 12.0–16.0)
LYMPHS PCT: 4 %
Lymphs Abs: 0.1 10*3/uL — ABNORMAL LOW (ref 1.0–3.6)
MCH: 32 pg (ref 26.0–34.0)
MCHC: 34.3 g/dL (ref 32.0–36.0)
MCV: 93.4 fL (ref 80.0–100.0)
MONOS PCT: 42 %
Monocytes Absolute: 1.1 10*3/uL — ABNORMAL HIGH (ref 0.2–0.9)
NEUTROS ABS: 1.5 10*3/uL (ref 1.4–6.5)
Neutrophils Relative %: 54 %
Platelets: 54 10*3/uL — ABNORMAL LOW (ref 150–440)
RBC: 3.03 MIL/uL — ABNORMAL LOW (ref 3.80–5.20)
RDW: 21.1 % — ABNORMAL HIGH (ref 11.5–14.5)
WBC: 2.7 10*3/uL — ABNORMAL LOW (ref 3.6–11.0)

## 2017-12-24 LAB — BPAM PLATELET PHERESIS
BLOOD PRODUCT EXPIRATION DATE: 201902182359
ISSUE DATE / TIME: 201902181354
UNIT TYPE AND RH: 5100

## 2017-12-24 LAB — PREPARE PLATELET PHERESIS: UNIT DIVISION: 0

## 2017-12-24 LAB — GLUCOSE, CAPILLARY: GLUCOSE-CAPILLARY: 92 mg/dL (ref 65–99)

## 2017-12-24 LAB — PHOSPHORUS: Phosphorus: 3.5 mg/dL (ref 2.5–4.6)

## 2017-12-24 LAB — POTASSIUM: POTASSIUM: 3.1 mmol/L — AB (ref 3.5–5.1)

## 2017-12-24 MED ORDER — NYSTATIN 100000 UNIT/ML MT SUSP
5.0000 mL | Freq: Four times a day (QID) | OROMUCOSAL | Status: DC
  Administered 2017-12-24 – 2017-12-26 (×7): 500000 [IU] via ORAL
  Filled 2017-12-24 (×7): qty 5

## 2017-12-24 MED ORDER — POTASSIUM CHLORIDE CRYS ER 20 MEQ PO TBCR
40.0000 meq | EXTENDED_RELEASE_TABLET | Freq: Once | ORAL | Status: AC
  Administered 2017-12-24: 17:00:00 40 meq via ORAL
  Filled 2017-12-24: qty 2

## 2017-12-24 NOTE — Progress Notes (Addendum)
Social work Theatre manager presented bed offers to patient and patient's daughter Shirlean Mylar. Patient chose [facility]. Magda Paganini, admission coordinator at [facility], is aware of accepted bed offer and will start [facility] SNF authorization. Per Magda Paganini if patient should start chemotherapy at [facility] they will have to discontinue rehab. Patient and patient's daughter are aware of above. Per Shirlean Mylar and patient she will not be receiving anymore chemo treatment until she is stronger and out of rehab.  Susy Frizzle, Social Work Intern [phone]

## 2017-12-24 NOTE — Progress Notes (Signed)
Patient ID: Veronica Boyd, female   DOB: 04-20-1946, 72 y.o.   MRN: [id number]  ACP note  Patient and daughter at bedside  Diagnosis: Follicular lymphoma, pancytopenia, sepsis and neutropenic fever  CODE STATUS discussed.  I would like to be full code at this point in time.  They would not like to be on a machine long-term if the patient needed long-term ventilator support.  Time spent on ACP discussion 17 minutes  Dr. Loletha Grayer

## 2017-12-24 NOTE — Progress Notes (Signed)
[place] INFECTIOUS DISEASE PROGRESS NOTE Date of Admission:  12/19/2017     ID: Veronica Boyd is a 72 y.o. female with NTP fever, E coli bacteremia Active Problems:   Sepsis ([place])   Anemia   Thrombocytopenia (HCC)   Protein-calorie malnutrition, severe   Subjective: No fevers, still weak but eating more. Still with lots of diarrhea  ROS  Eleven systems are reviewed and negative except per hpi  Medications:  Antibiotics Given (last 72 hours)    Date/Time Action Medication Dose Rate   12/21/17 1700 New Bag/Given   metroNIDAZOLE (FLAGYL) IVPB 500 mg 500 mg 100 mL/hr   12/21/17 2125 New Bag/Given   meropenem (MERREM) 1 g in sodium chloride 0.9 % 100 mL IVPB 1 g 200 mL/hr   12/22/17 0002 New Bag/Given   metroNIDAZOLE (FLAGYL) IVPB 500 mg 500 mg 100 mL/hr   12/22/17 0725 New Bag/Given   metroNIDAZOLE (FLAGYL) IVPB 500 mg 500 mg 100 mL/hr   12/22/17 5093 New Bag/Given   meropenem (MERREM) 1 g in sodium chloride 0.9 % 100 mL IVPB 1 g 200 mL/hr   12/22/17 1456 New Bag/Given   ceFAZolin (ANCEF) IVPB 2g/100 mL premix 2 g 200 mL/hr   12/22/17 2057 New Bag/Given   ceFAZolin (ANCEF) IVPB 2g/100 mL premix 2 g 200 mL/hr   12/23/17 2671 New Bag/Given   ceFAZolin (ANCEF) IVPB 2g/100 mL premix 2 g 200 mL/hr   12/23/17 1500 New Bag/Given   ceFAZolin (ANCEF) IVPB 2g/100 mL premix 2 g 200 mL/hr   12/23/17 2203 New Bag/Given   ceFAZolin (ANCEF) IVPB 2g/100 mL premix 2 g 200 mL/hr   12/24/17 0506 New Bag/Given   ceFAZolin (ANCEF) IVPB 2g/100 mL premix 2 g 200 mL/hr     . dronabinol  5 mg Oral BID AC  . feeding supplement  1 Container Oral TID BM  . multivitamin with minerals  1 tablet Oral Daily  . nystatin  5 mL Oral QID  . pantoprazole (PROTONIX) IV  40 mg Intravenous Q24H  . potassium & sodium phosphates  1 packet Oral TID AC & HS  . simvastatin  20 mg Oral QHS    Objective: Vital signs in last 24 hours: Temp:  [97.9 F (36.6 C)-99 F (37.2 C)] 97.9 F (36.6 C) (02/19  1201) Pulse Rate:  [95-119] 95 (02/19 1201) Resp:  [16-22] 16 (02/19 1201) BP: (101-113)/(48-65) 104/54 (02/19 1201) SpO2:  [95 %-99 %] 99 % (02/19 1201) Constitutional:  oriented to person, place, and time. Thin, chronicaly ill appearing HENT: [place]/AT, PERRLA, no scleral icterus pale Mouth/Throat: Oropharynx is clear and dry . No oropharyngeal exudate.  Cardiovascular: Normal rate, regular rhythm and normal heart sounds. Pulmonary/Chest: Effort normal and breath sounds normal. No respiratory distress.  has no wheezes.  Neck = supple, no nuchal rigidity Abdominal: Soft. Bowel sounds are normal.  exhibits no distension. There is no tenderness.  Lymphadenopathy: no cervical adenopathy. No axillary adenopathy Portacath site wnl Neurological: alert and oriented to person, place, and time.  Skin: Skin is warm and dry. Some bruising Psychiatric: a normal mood and affect.  behavior is normal.      Lab Results Recent Labs    12/22/17 0343  12/23/17 0446 12/24/17 0455  WBC  --   --  1.4* 2.7*  HGB  --   --  9.9* 9.7*  HCT  --   --  29.2* 28.3*  NA 132*  --  131*  --   K 2.9*  2.9*   < >  3.7 3.1*  CL 100*  --  98*  --   CO2 25  --  25  --   BUN 5*  --  7  --   CREATININE 0.33*  --  0.43*  --    < > = values in this interval not displayed.    Microbiology: Results for orders placed or performed during the hospital encounter of 12/19/17  Blood Culture (routine x 2)     Status: Abnormal   Collection Time: 12/19/17  7:10 PM  Result Value Ref Range Status   Specimen Description   Final    BLOOD LEFT WRIST Performed at [facility], [address] [place], [place] [address]    Special Requests   Final    BOTTLES DRAWN AEROBIC AND ANAEROBIC Blood Culture adequate volume Performed at [facility], [address], [place], [place] [address]    Culture  Setup Time   Final    GRAM NEGATIVE RODS IN BOTH AEROBIC AND ANAEROBIC BOTTLES CRITICAL RESULT CALLED TO,  READ BACK BY AND VERIFIED WITH: HANK ZOMPA AT 1601 ON 12/20/17 [place]. Performed at [facility], [place] [address], [place], [place] [address]    Culture ESCHERICHIA COLI (A)  Final   Report Status 12/22/2017 FINAL  Final   Organism ID, Bacteria ESCHERICHIA COLI  Final      Susceptibility   Escherichia coli - MIC*    AMPICILLIN 8 SENSITIVE Sensitive     CEFAZOLIN <=4 SENSITIVE Sensitive     CEFEPIME <=1 SENSITIVE Sensitive     CEFTAZIDIME <=1 SENSITIVE Sensitive     CEFTRIAXONE <=1 SENSITIVE Sensitive     CIPROFLOXACIN <=0.25 SENSITIVE Sensitive     GENTAMICIN <=1 SENSITIVE Sensitive     IMIPENEM <=0.25 SENSITIVE Sensitive     TRIMETH/SULFA <=20 SENSITIVE Sensitive     AMPICILLIN/SULBACTAM <=2 SENSITIVE Sensitive     PIP/TAZO <=4 SENSITIVE Sensitive     Extended ESBL NEGATIVE Sensitive     * ESCHERICHIA COLI  Blood Culture ID Panel (Reflexed)     Status: Abnormal   Collection Time: 12/19/17  7:10 PM  Result Value Ref Range Status   Enterococcus species NOT DETECTED NOT DETECTED Final   Listeria monocytogenes NOT DETECTED NOT DETECTED Final   Staphylococcus species NOT DETECTED NOT DETECTED Final   Staphylococcus aureus NOT DETECTED NOT DETECTED Final   Streptococcus species NOT DETECTED NOT DETECTED Final   Streptococcus agalactiae NOT DETECTED NOT DETECTED Final   Streptococcus pneumoniae NOT DETECTED NOT DETECTED Final   Streptococcus pyogenes NOT DETECTED NOT DETECTED Final   Acinetobacter baumannii NOT DETECTED NOT DETECTED Final   Enterobacteriaceae species DETECTED (A) NOT DETECTED Final    Comment: Enterobacteriaceae represent a large family of gram-negative bacteria, not a single organism. CRITICAL RESULT CALLED TO, READ BACK BY AND VERIFIED WITH: HANK ZOMPA AT 5573 ON 12/20/17 [place].    Enterobacter cloacae complex NOT DETECTED NOT DETECTED Final   Escherichia coli DETECTED (A) NOT DETECTED Final    Comment: CRITICAL RESULT CALLED TO, READ BACK BY AND VERIFIED  WITH: HANK ZOMPA AT 2202 ON 12/20/17 [place].    Klebsiella oxytoca NOT DETECTED NOT DETECTED Final   Klebsiella pneumoniae NOT DETECTED NOT DETECTED Final   Proteus species NOT DETECTED NOT DETECTED Final   Serratia marcescens NOT DETECTED NOT DETECTED Final   Carbapenem resistance NOT DETECTED NOT DETECTED Final   Haemophilus influenzae NOT DETECTED NOT DETECTED Final   Neisseria meningitidis NOT DETECTED NOT DETECTED Final  Pseudomonas aeruginosa NOT DETECTED NOT DETECTED Final   Candida albicans NOT DETECTED NOT DETECTED Final   Candida glabrata NOT DETECTED NOT DETECTED Final   Candida krusei NOT DETECTED NOT DETECTED Final   Candida parapsilosis NOT DETECTED NOT DETECTED Final   Candida tropicalis NOT DETECTED NOT DETECTED Final    Comment: Performed at [facility], [address] [place], [place] [address]  Blood Culture (routine x 2)     Status: Abnormal   Collection Time: 12/19/17  7:40 PM  Result Value Ref Range Status   Specimen Description   Final    BLOOD RFOA Performed at [facility], [address], [place], [place] [address]    Special Requests   Final    BOTTLES DRAWN AEROBIC AND ANAEROBIC Blood Culture adequate volume Performed at [facility], [address] [place], [place] [address]    Culture  Setup Time   Final    GRAM NEGATIVE RODS ANAEROBIC BOTTLE ONLY CRITICAL RESULT CALLED TO, READ BACK BY AND VERIFIED WITH: HANK ZOMPA AT 1607 ON 12/20/17 [place]. Performed at [facility], [address] [place], [place] [address]    Culture (A)  Final    ESCHERICHIA COLI SUSCEPTIBILITIES PERFORMED ON PREVIOUS CULTURE WITHIN THE LAST 5 DAYS. Performed at [facility], [place] [address], [place], [place] [address]    Report Status 12/22/2017 FINAL  Final  Urine culture     Status: Abnormal   Collection Time: 12/19/17  7:40 PM  Result Value Ref Range Status   Specimen Description   Final    URINE, RANDOM Performed at  [facility], [address], [place], [place] [address]    Special Requests   Final    NONE Performed at [facility], [address] [place], [place] [address]    Culture (A)  Final    <10,000 COLONIES/mL Performed at [facility], [place] [address], [place], [place] [address]    Report Status 12/21/2017 FINAL  Final  MRSA PCR Screening     Status: None   Collection Time: 12/19/17 11:11 PM  Result Value Ref Range Status   MRSA by PCR NEGATIVE NEGATIVE Final    Comment:        The GeneXpert MRSA Assay (FDA approved for NASAL specimens only), is one component of a comprehensive MRSA colonization surveillance program. It is not intended to diagnose MRSA infection nor to guide or monitor treatment for MRSA infections. Performed at [facility], [address] [place], [place] [address]   CULTURE, BLOOD (ROUTINE X 2) w Reflex to ID Panel     Status: None (Preliminary result)   Collection Time: 12/21/17  5:57 AM  Result Value Ref Range Status   Specimen Description BLOOD RIGHT HAND  Final   Special Requests   Final    BOTTLES DRAWN AEROBIC AND ANAEROBIC Blood Culture adequate volume   Culture   Final    NO GROWTH 3 DAYS Performed at [facility], [address] [place], [place] [address]    Report Status PENDING  Incomplete  CULTURE, BLOOD (ROUTINE X 2) w Reflex to ID Panel     Status: None (Preliminary result)   Collection Time: 12/21/17  5:57 AM  Result Value Ref Range Status   Specimen Description BLOOD LEFT ANTECUBITAL  Final   Special Requests   Final    BOTTLES DRAWN AEROBIC AND ANAEROBIC Blood Culture adequate volume   Culture  Final    NO GROWTH 3 DAYS Performed at [facility], [address] [place], [place] [address]    Report Status PENDING  Incomplete  C difficile quick scan w PCR reflex     Status: None   Collection Time: 12/21/17 10:24 AM  Result Value Ref Range Status   C Diff antigen NEGATIVE  NEGATIVE Final   C Diff toxin NEGATIVE NEGATIVE Final   C Diff interpretation No C. difficile detected.  Final    Comment: Performed at [facility], [address] [place], [place] [address]    Studies/Results: No results found.  Assessment/Plan: HUBERT DERSTINE is a 72 y.o. 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 Improving with IV abx. CT shows probably proctitis as source of bacteremia.   2/19 - no fever, anc up to 1.5   Recommendations Would continue IV cefazolin while inpatient.  At time of dc can send home on oral keflex for a total 14 day course. If fevers recur broaden coverage to include other enteric pathogens. Defer to oncology treatment of the diarrhea-  Portacath infections with gram neg rods are unusual so would not remove the cath at this time. However I can see towards end of abx course and would suggest to repeat BCX once of abx to document clearance.   Thank you very much for the consult. Will follow with you.  Leonel Ramsay   12/24/2017, 2:29 PM

## 2017-12-24 NOTE — Evaluation (Signed)
Physical Therapy Evaluation Patient Details Name: Veronica Boyd MRN: [id number] DOB: 1946-07-06 Today's Date: 12/24/2017   History of Present Illness  Pt is a64 y.o.femalewith a known history of recurrent follicular lymphoma,currently undergoing chemotherapy,status post fifth CHOP cycle 1 week ago. Patient was brought to emergency room for fever,as high as 102 and generalized weakness and fatigue going on for the past 24 hours.Symptoms are severe and improved very little with Vicodin at home.Per family the patient has been noted with very poor appetite since on chemotherapy.Her p.o. intake has been particularly poor recently.She denied, however,any new chest pain or shortness of breath;no nausea, vomiting, diarrhea;no cough;no bleeding. Blood test results from emergency room showed pancytopenia with WBC at 0.1,absolute neutrophil count at 0;platelet count 6.Hemoglobin level was 7.Potassium level was low at 2.9.Lactic acid level was elevated at 2.7.Patient was noted with hypotension and tachycardia.Chest x-ray and UA were unremarkable. Patient is admitted for further evaluation and treatment.  Assessment includes: E. coli sepsis and neutropenic fever, recurrent follicular lymphoma and pancytopenia, hyopkalemia, hypomagnesemia, hypophosphatemia, N&V, diarrhea, HLD, and hyponatremia.    Clinical Impression  Pt presents with significant deficits in strength, transfers, mobility, gait, balance, and activity tolerance.  Pt required min A and increased time and effort for all bed mobility tasks.  Pt required min A and cues for sequencing during sit to/from stand transfers.  Pt attempted to advance a LE forward while in standing at EOB but was unable to do so with max standing time around 10-15 sec.  Pt's baseline SpO2 on room air was 99% with HR 102 bpm.  After activity pt's SpO2 remained at 99% with HR increasing to 111 bpm.  Pt will benefit from PT services in a SNF setting upon  discharge to safely address above deficits for decreased caregiver assistance and eventual return to PLOF.      Follow Up Recommendations SNF    Equipment Recommendations  Rolling walker with 5" wheels;Other (comment)(TBD at next venue of care if discharges to a SNF)    Recommendations for Other Services       Precautions / Restrictions Precautions Precautions: Fall Restrictions Weight Bearing Restrictions: No      Mobility  Bed Mobility Overal bed mobility: Needs Assistance Bed Mobility: Sit to Supine;Supine to Sit     Supine to sit: Min assist Sit to supine: Min assist   General bed mobility comments: Min verbal cues for sequencing with extra time and effort required for all tasks  Transfers Overall transfer level: Needs assistance Equipment used: Rolling walker (2 wheeled) Transfers: Sit to/from Stand Sit to Stand: Min assist         General transfer comment: Min verbal cues for sequencing with high amount of effort required for pt to stand from EOB.  Ambulation/Gait             General Gait Details: Pt attempted to advance a LE forward while in standing at EOB but was unable to do so.  Stairs            Wheelchair Mobility    Modified Rankin (Stroke Patients Only)       Balance Overall balance assessment: Needs assistance Sitting-balance support: Feet supported;Bilateral upper extremity supported Sitting balance-Leahy Scale: Fair     Standing balance support: Bilateral upper extremity supported Standing balance-Leahy Scale: Poor Standing balance comment: Heavy reliance on BUEs to remain standing  Pertinent Vitals/Pain Pain Assessment: No/denies pain    Home Living Family/patient expects to be discharged to:: Private residence Living Arrangements: Children Available Help at Discharge: Family;Available 24 hours/day Type of Home: Mobile home Home Access: Stairs to enter Entrance Stairs-Rails:  Left Entrance Stairs-Number of Steps: 4 Home Layout: One level Home Equipment: None      Prior Function Level of Independence: Independent         Comments: Ind amb community distances without AD, Ind with ADLs, no fall history     Hand Dominance        Extremity/Trunk Assessment   Upper Extremity Assessment Upper Extremity Assessment: Generalized weakness    Lower Extremity Assessment Lower Extremity Assessment: Generalized weakness       Communication   Communication: No difficulties  Cognition Arousal/Alertness: Awake/alert Behavior During Therapy: WFL for tasks assessed/performed Overall Cognitive Status: Within Functional Limits for tasks assessed                                        General Comments      Exercises Total Joint Exercises Ankle Circles/Pumps: AROM;Both;5 reps;10 reps Quad Sets: Strengthening;Both;5 reps;10 reps Gluteal Sets: Strengthening;Both;10 reps Hip ABduction/ADduction: Both;5 reps;AROM Straight Leg Raises: AROM;Both;5 reps Long Arc Quad: AROM;Both;5 reps;10 reps Knee Flexion: AROM;Both;5 reps;10 reps Other Exercises Other Exercises: HEP education for BLE LAQ, APs, QS, and GS x 10 each 5-6x/day    Assessment/Plan    PT Assessment Patient needs continued PT services  PT Problem List Decreased strength;Decreased activity tolerance;Decreased balance;Decreased mobility;Decreased knowledge of use of DME       PT Treatment Interventions DME instruction;Gait training;Functional mobility training;Balance training;Therapeutic exercise;Therapeutic activities;Patient/family education    PT Goals (Current goals can be found in the Care Plan section)  Acute Rehab PT Goals Patient Stated Goal: To improve strength and walking PT Goal Formulation: With patient Time For Goal Achievement: 01/06/18 Potential to Achieve Goals: Good    Frequency Min 2X/week   Barriers to discharge Inaccessible home environment       Co-evaluation               AM-PAC PT "6 Clicks" Daily Activity  Outcome Measure Difficulty turning over in bed (including adjusting bedclothes, sheets and blankets)?: Unable Difficulty moving from lying on back to sitting on the side of the bed? : Unable Difficulty sitting down on and standing up from a chair with arms (e.g., wheelchair, bedside commode, etc,.)?: Unable Help needed moving to and from a bed to chair (including a wheelchair)?: A Lot Help needed walking in hospital room?: Total Help needed climbing 3-5 steps with a railing? : Total 6 Click Score: 7    End of Session Equipment Utilized During Treatment: Gait belt Activity Tolerance: Patient limited by fatigue Patient left: in bed;with call bell/phone within reach;with bed alarm set;with family/visitor present(Pt declined up in chair) Nurse Communication: Mobility status PT Visit Diagnosis: Difficulty in walking, not elsewhere classified (R26.2);Muscle weakness (generalized) (M62.81)    Time: 0932(8:50-9:03)-1001 PT Time Calculation (min) (ACUTE ONLY): 29 min   Charges:   PT Evaluation $PT Eval Low Complexity: 1 Low PT Treatments $Therapeutic Exercise: 8-22 mins   PT G Codes:        DRoyetta Asal PT, DPT 12/24/17, 11:32 AM

## 2017-12-24 NOTE — Clinical Social Work Note (Addendum)
Clinical Social Work Assessment  Patient Details  Name: Veronica Boyd MRN: [id number] Date of Birth: [birth year]  Date of referral:  12/24/17               Reason for consult:  Facility Placement                Permission sought to share information with:  Chartered certified accountant granted to share information::  Yes, Verbal Permission Granted  Name::      [place]::   [place]  Relationship::     Contact Information:     Housing/Transportation Living arrangements for the past 2 months:  [place] of Information:  Patient, Adult Children Patient Interpreter Needed:  None Criminal Activity/Legal Involvement Pertinent to Current Situation/Hospitalization:  No - Comment as needed Significant Relationships:  Adult Children Lives with:  Adult Children Do you feel safe going back to the place where you live?  Yes Need for family participation in patient care:  Yes (Comment)  Care giving concerns:  Patient lives with daughter Veronica Boyd [phone]) in [place].   Social Worker assessment / plan: Clinical Social Worker(CSW) received SNF consult. PT is recommending SNF. Social work Theatre manager met with patient, daughter Veronica Boyd, and sister Veronica Boyd at bedside. Patient was sitting up in bed, alert and oriented to self, place, time. Social work Theatre manager introduced self and explained the role of the [place]. Patient shared she lives in [place] with daughter Veronica Boyd. Patient shared she  recieves chemotherapy at [facility] once every 3 weeks with Dr. Maryjane Hurter and daughter transports her. Social work Theatre manager explained that PT is recommending SNF and [employer] will have to approve it. Patient and patient's daughter verbalized their understanding and is agreeable to SNF placement. Patient prefers [facility]. FL2 completed and faxed out. CSW and social work Theatre manager will continue to follow up and assist.  Employment status:   Retired Forensic scientist:  Medicaid [place], [employer] PT Recommendations:  [place] / Referral to community resources:  [place]  Patient/Family's Response to care: Patient is agreeable to SNF placement.  Patient/Family's Understanding of and Emotional Response to Diagnosis, Current Treatment, and Prognosis: Patient and patient's daughter were both pleasant and thanked social work Theatre manager for her assistance.  Emotional Assessment Appearance:  Appears stated age Attitude/Demeanor/Rapport:    Affect (typically observed):  Accepting, Calm, Pleasant Orientation:  Oriented to Self, Oriented to Place, Oriented to  Time Alcohol / Substance use:  Not Applicable Psych involvement (Current and /or in the community):  No (Comment)  Discharge Needs  Concerns to be addressed:  Care Coordination, Discharge Planning Concerns Readmission within the last 30 days:  No Current discharge risk:  Dependent with Mobility Barriers to Discharge:  Continued Medical Work up   Smith Mince, Student-Social Work 12/24/2017, 2:04 PM

## 2017-12-24 NOTE — Plan of Care (Signed)
  Progressing Education: Knowledge of General Education information will improve 12/24/2017 0517 - Progressing by Denice Bors, RN Health Behavior/Discharge Planning: Ability to manage health-related needs will improve 12/24/2017 0517 - Progressing by Denice Bors, RN Clinical Measurements: Ability to maintain clinical measurements within normal limits will improve 12/24/2017 0517 - Progressing by Denice Bors, RN Will remain free from infection 12/24/2017 0517 - Progressing by Denice Bors, RN Diagnostic test results will improve 12/24/2017 0517 - Progressing by Denice Bors, RN Respiratory complications will improve 12/24/2017 0517 - Progressing by Denice Bors, RN Cardiovascular complication will be avoided 12/24/2017 0517 - Progressing by Denice Bors, RN Activity: Risk for activity intolerance will decrease 12/24/2017 0517 - Progressing by Denice Bors, RN Nutrition: Adequate nutrition will be maintained 12/24/2017 0517 - Progressing by Denice Bors, RN Coping: Level of anxiety will decrease 12/24/2017 0517 - Progressing by Denice Bors, RN Elimination: Will not experience complications related to bowel motility 12/24/2017 0517 - Progressing by Denice Bors, RN Will not experience complications related to urinary retention 12/24/2017 0517 - Progressing by Denice Bors, RN Pain Managment: General experience of comfort will improve 12/24/2017 0517 - Progressing by Denice Bors, RN Safety: Ability to remain free from injury will improve 12/24/2017 0517 - Progressing by Denice Bors, RN Skin Integrity: Risk for impaired skin integrity will decrease 12/24/2017 0517 - Progressing by Denice Bors, RN

## 2017-12-24 NOTE — NC FL2 (Signed)
[place] LEVEL OF CARE SCREENING TOOL     IDENTIFICATION  Patient Name: Veronica Boyd Birthdate: 1946-09-09 Sex: female Admission Date (Current Location): 12/19/2017  [facility] and [place] Number:  Selena Lesser ([id number] [facility]) Facility and Address:  [facility], [address], [place], [place] [address]      Provider Number: [id number]  Attending Physician Name and Address:  Loletha Grayer, MD  Relative Name and Phone Number:       Current Level of Care: SNF Recommended Level of Care: [place] Prior Approval Number:    Date Approved/Denied:   PASRR Number: [id number])  Discharge Plan: SNF    Current Diagnoses: Patient Active Problem List   Diagnosis Date Noted  . Protein-calorie malnutrition, severe 12/20/2017  . Anemia   . Thrombocytopenia ([place])   . Sepsis ([place]) 12/19/2017  . Anemia due to antineoplastic chemotherapy 12/17/2017  . Goals of care, counseling/discussion 09/19/2017  . Pure hypercholesterolemia 09/10/2016  . 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 Follicular lymphoma ([place]) 07/24/2016  . Mass of chest wall, right 06/06/2016  . Primary osteoarthritis involving multiple joints 12/14/2015  . Transient insomnia 08/27/2015  . Mixed hyperlipidemia 03/01/2015  . Nocturnal muscle cramps 03/01/2015  . Osteoporosis 11/08/2014  . Primary osteoarthritis of both knees 07/05/2014  . Hyperlipidemia, unspecified 03/19/2014  . Osteoarthritis of knee 03/19/2014  . Abnormal mammogram 03/03/2013  . Vitamin D deficiency 05/09/2012    Orientation RESPIRATION BLADDER Height & Weight     Self, Time, Place  Normal Incontinent Weight: 100 lb 12 oz (45.7 kg) Height:  4\' 11"  (149.9 cm)  BEHAVIORAL SYMPTOMS/MOOD NEUROLOGICAL BOWEL NUTRITION STATUS      Continent Diet(Regular)  AMBULATORY STATUS COMMUNICATION OF NEEDS Skin   Extensive Assist Verbally Normal                       Personal Care Assistance Level of  Assistance  Bathing, Feeding, Dressing Bathing Assistance: Limited assistance Feeding assistance: Independent Dressing Assistance: Limited assistance     Functional Limitations Info  Sight, Hearing, Speech Sight Info: Adequate Hearing Info: Adequate Speech Info: Adequate    SPECIAL CARE FACTORS FREQUENCY  PT (By licensed PT), OT (By licensed OT)     PT Frequency: (5) OT Frequency: (5)            Contractures      Additional Factors Info  Code Status, Allergies Code Status Info: (Full Code) Allergies Info: (No Known Allergies)           Current Medications (12/24/2017):  This is the current hospital active medication list Current Facility-Administered Medications  Medication Dose Route Frequency Provider Last Rate Last Dose  . acetaminophen (TYLENOL) tablet 650 mg  650 mg Oral Q6H PRN Amelia Jo, MD       Or  . acetaminophen (TYLENOL) suppository 650 mg  650 mg Rectal Q6H PRN Amelia Jo, MD      . albuterol (PROVENTIL) (2.5 MG/3ML) 0.083% nebulizer solution 2.5 mg  2.5 mg Inhalation Q6H PRN Amelia Jo, MD      . bisacodyl (DULCOLAX) EC tablet 5 mg  5 mg Oral Daily PRN Amelia Jo, MD      . ceFAZolin (ANCEF) IVPB 2g/100 mL premix  2 g Intravenous Q8H Lenis Noon, [facility]   Stopped at 12/24/17 0536  . dronabinol (MARINOL) capsule 5 mg  5 mg Oral BID AC Lafayette Dragon, MD   5 mg at 12/24/17 1302  . feeding supplement (BOOST /  RESOURCE BREEZE) liquid 1 Container  1 Container Oral TID BM Loletha Grayer, MD   1 Container at 12/24/17 1301  . HYDROcodone-acetaminophen (NORCO/VICODIN) 5-325 MG per tablet 1-2 tablet  1-2 tablet Oral Q4H PRN Amelia Jo, MD   1 tablet at 12/24/17 0920  . lip balm (BLISTEX) ointment   Topical PRN Lafayette Dragon, MD      . liver oil-zinc oxide (DESITIN) 40 % ointment   Topical PRN Dorene Sorrow S, NP      . loperamide (IMODIUM) capsule 2 mg  2 mg Oral PRN Lafayette Dragon, MD   2 mg at 12/22/17 1623  . metoprolol tartrate  (LOPRESSOR) injection 2.5 mg  2.5 mg Intravenous Q6H PRN Salary, Montell D, MD   2.5 mg at 12/22/17 1848  . multivitamin with minerals tablet 1 tablet  1 tablet Oral Daily Amelia Jo, MD   1 tablet at 12/24/17 [phone]  . nystatin (MYCOSTATIN) 100000 UNIT/ML suspension 500,000 Units  5 mL Oral QID Wieting, Richard, MD      . ondansetron [facility]) tablet 4 mg  4 mg Oral Q6H PRN Amelia Jo, MD       Or  . ondansetron [facility]) injection 4 mg  4 mg Intravenous Q6H PRN Amelia Jo, MD   4 mg at 12/22/17 1623  . pantoprazole (PROTONIX) injection 40 mg  40 mg Intravenous Q24H Dorene Sorrow S, NP   40 mg at 12/23/17 2016  . potassium & sodium phosphates (PHOS-NAK) 280-160-250 MG packet 1 packet  1 packet Oral TID AC & HS Lafayette Dragon, MD   1 packet at 12/24/17 1302  . prochlorperazine (COMPAZINE) tablet 10 mg  10 mg Oral Q6H PRN Amelia Jo, MD      . promethazine (PHENERGAN) injection 6.25 mg  6.25 mg Intravenous Q6H PRN Lafayette Dragon, MD   6.25 mg at 12/20/17 1550  . simvastatin (ZOCOR) tablet 20 mg  20 mg Oral QHS Amelia Jo, MD   20 mg at 12/23/17 2016  . traZODone (DESYREL) tablet 25 mg  25 mg Oral QHS PRN Amelia Jo, MD   25 mg at 12/21/17 2130   Facility-Administered Medications Ordered in Other Encounters  Medication Dose Route Frequency Provider Last Rate Last Dose  . heparin lock flush 100 unit/mL  500 Units Intravenous Once Lloyd Huger, MD      . sodium chloride flush (NS) 0.9 % injection 10 mL  10 mL Intravenous PRN Lloyd Huger, MD   10 mL at 12/04/17 0901  . yttrium-90 injection 57.2 millicurie  62.0 millicurie Intravenous Once [name], MD         Discharge Medications: Please see discharge summary for a list of discharge medications.  Relevant Imaging Results:  Relevant Lab Results:   Additional Information (SSN: [id number])  Smith Mince, Student-Social Work

## 2017-12-24 NOTE — Clinical Social Work Placement (Signed)
   CLINICAL SOCIAL WORK PLACEMENT  NOTE  Date:  12/24/2017  Patient Details  Name: Veronica Boyd MRN: [id number] Date of Birth: [birth year]  Clinical Social Work is seeking post-discharge placement for this patient at the [facility] level of care (*CSW will initial, date and re-position this form in  chart as items are completed):  Yes   Patient/family provided with [facility] Work Department's list of facilities offering this level of care within the geographic area requested by the patient (or if unable, by the patient's family).  Yes   Patient/family informed of their freedom to choose among providers that offer the needed level of care, that participate in Medicare, Medicaid or managed care program needed by the patient, have an available bed and are willing to accept the patient.  Yes   Patient/family informed of [facility]'s ownership interest in [facility] and [facility], as well as of the fact that they are under no obligation to receive care at these facilities.  PASRR submitted to EDS on 12/24/17     PASRR number received on 12/24/17     Existing PASRR number confirmed on       FL2 transmitted to all facilities in geographic area requested by pt/family on 12/24/17     FL2 transmitted to all facilities within larger geographic area on       Patient informed that his/her managed care company has contracts with or will negotiate with certain facilities, including the following:        Yes   Patient/family informed of bed offers received.  Patient chooses bed at       Physician recommends and patient chooses bed at      Patient to be transferred to   on  .  Patient to be transferred to facility by       Patient family notified on   of transfer.  Name of family member notified:        PHYSICIAN       Additional Comment:    _______________________________________________ Tiera Mensinger, Veronia Beets, LCSW 12/24/2017, 4:07 PM

## 2017-12-24 NOTE — Progress Notes (Signed)
Patient ID: Veronica Boyd, female   DOB: [birth year], 72 y.o.   MRN: [id number]   [employer] PROGRESS NOTE  Veronica Boyd ZJQ:[id number] DOB: [birth year] DOA: 12/19/2017 PCP: Glendon Axe, MD  HPI/Subjective: Patient had a bad night and morning.  Still having diarrhea.  Not feeling great.  Feeling very weak.  Not eating very much.  Objective: Vitals:   12/24/17 1120 12/24/17 1201  BP:  (!) 104/54  Pulse: (!) 102 95  Resp:  16  Temp:  97.9 F (36.6 C)  SpO2: 99% 99%    Filed Weights   12/19/17 1839 12/20/17 0500 12/22/17 0356  Weight: 42.6 kg (94 lb) 45.5 kg (100 lb 6.4 oz) 45.7 kg (100 lb 12 oz)    ROS: Review of Systems  Constitutional: Negative for chills and fever.  Eyes: Negative for blurred vision.  Respiratory: Negative for cough and shortness of breath.   Cardiovascular: Negative for chest pain.  Gastrointestinal: Positive for diarrhea and nausea. Negative for abdominal pain, constipation and vomiting.  Genitourinary: Negative for dysuria.  Musculoskeletal: Negative for joint pain.  Neurological: Negative for dizziness and headaches.   Exam: Physical Exam  Constitutional: She is oriented to person, place, and time.  HENT:  Nose: No mucosal edema.  Mouth/Throat: No oropharyngeal exudate or posterior oropharyngeal edema.  Eyes: Conjunctivae, EOM and lids are normal. Pupils are equal, round, and reactive to light.  Neck: No JVD present. Carotid bruit is not present. No edema present. No thyroid mass and no thyromegaly present.  Cardiovascular: S1 normal and S2 normal. Exam reveals no gallop.  No murmur heard. Pulses:      Dorsalis pedis pulses are 2+ on the right side, and 2+ on the left side.  Respiratory: No respiratory distress. She has decreased breath sounds in the right lower field and the left lower field. She has no wheezes. She has no rhonchi. She has no rales.  GI: Soft. Bowel sounds are normal. There is no tenderness.  Musculoskeletal:   Right ankle: She exhibits no swelling.       Left ankle: She exhibits no swelling.  Lymphadenopathy:    She has no cervical adenopathy.  Neurological: She is alert and oriented to person, place, and time. No cranial nerve deficit.  Skin: Skin is warm. Nails show no clubbing.  Chronic lower extremity bruising  Psychiatric: She has a normal mood and affect.      Data Reviewed: Basic Metabolic Panel: Recent Labs  Lab 12/20/17 0923 12/20/17 2348 12/21/17 0549 12/21/17 1242 12/22/17 0343 12/22/17 1600 12/23/17 0446 12/24/17 0455  NA 131* 131* 133*  --  132*  --  131*  --   K 3.5 2.4* 3.5 3.1* 2.9*  2.9* 4.3 3.7 3.1*  CL 102 101 103  --  100*  --  98*  --   CO2 22 22 21*  --  25  --  25  --   GLUCOSE 140* 88 95  --  83  --  83  --   BUN 12 7 <5*  --  5*  --  7  --   CREATININE 0.49 0.35* 0.38*  --  0.33*  --  0.43*  --   CALCIUM 7.8* 7.6* 7.6*  --  7.9*  --  8.2*  --   MG 2.3 1.7 1.9  --  1.8 1.8  --   --   PHOS 3.6 1.7* 2.6  --   --  1.4* 1.9* 3.5   Liver Function Tests:  Recent Labs  Lab 12/19/17 1910 12/20/17 2348  AST 20 15  ALT 11* 10*  ALKPHOS 34* 33*  BILITOT 0.8 1.8*  PROT 5.7* 4.8*  ALBUMIN 2.9* 2.4*    CBC: Recent Labs  Lab 12/19/17 1910 12/20/17 0923 12/20/17 2348 12/21/17 0549 12/23/17 0446 12/24/17 0455  WBC 0.1* 0.1* 0.1* 0.2* 1.4* 2.7*  NEUTROABS 0.0*  --   --  0.0* 0.6* 1.5  HGB 7.0* 8.4* 9.7* 8.8* 9.9* 9.7*  HCT 20.7* 24.9* 27.2* 25.4* 29.2* 28.3*  MCV 102.9* 89.8 89.5 90.1 91.9 93.4  PLT 6* 47* 27* 21* 8* 54*   Cardiac Enzymes: Recent Labs  Lab 12/20/17 2348 12/21/17 0549 12/21/17 1242  TROPONINI 0.07* 0.06* 0.05*    CBG: Recent Labs  Lab 12/23/17 0746 12/23/17 1146 12/23/17 1702 12/23/17 2013 12/24/17 0745  GLUCAP 94 143* 124* 95 92    Recent Results (from the past 240 hour(s))  Blood Culture (routine x 2)     Status: Abnormal   Collection Time: 12/19/17  7:10 PM  Result Value Ref Range Status   Specimen Description    Final    BLOOD LEFT WRIST Performed at [facility] [address], [place], [place] [address]    Special Requests   Final    BOTTLES DRAWN AEROBIC AND ANAEROBIC Blood Culture adequate volume Performed at [facility], [address] [place], [place] [address]    Culture  Setup Time   Final    GRAM NEGATIVE RODS IN BOTH AEROBIC AND ANAEROBIC BOTTLES CRITICAL RESULT CALLED TO, READ BACK BY AND VERIFIED WITH: HANK ZOMPA AT 5397 ON 12/20/17 [place]. Performed at [facility], [place] [address] [place], [place] [address]    Culture ESCHERICHIA COLI (A)  Final   Report Status 12/22/2017 FINAL  Final   Organism ID, Bacteria ESCHERICHIA COLI  Final      Susceptibility   Escherichia coli - MIC*    AMPICILLIN 8 SENSITIVE Sensitive     CEFAZOLIN <=4 SENSITIVE Sensitive     CEFEPIME <=1 SENSITIVE Sensitive     CEFTAZIDIME <=1 SENSITIVE Sensitive     CEFTRIAXONE <=1 SENSITIVE Sensitive     CIPROFLOXACIN <=0.25 SENSITIVE Sensitive     GENTAMICIN <=1 SENSITIVE Sensitive     IMIPENEM <=0.25 SENSITIVE Sensitive     TRIMETH/SULFA <=20 SENSITIVE Sensitive     AMPICILLIN/SULBACTAM <=2 SENSITIVE Sensitive     PIP/TAZO <=4 SENSITIVE Sensitive     Extended ESBL NEGATIVE Sensitive     * ESCHERICHIA COLI  Blood Culture ID Panel (Reflexed)     Status: Abnormal   Collection Time: 12/19/17  7:10 PM  Result Value Ref Range Status   Enterococcus species NOT DETECTED NOT DETECTED Final   Listeria monocytogenes NOT DETECTED NOT DETECTED Final   Staphylococcus species NOT DETECTED NOT DETECTED Final   Staphylococcus aureus NOT DETECTED NOT DETECTED Final   Streptococcus species NOT DETECTED NOT DETECTED Final   Streptococcus agalactiae NOT DETECTED NOT DETECTED Final   Streptococcus pneumoniae NOT DETECTED NOT DETECTED Final   Streptococcus pyogenes NOT DETECTED NOT DETECTED Final   Acinetobacter baumannii NOT DETECTED NOT DETECTED Final   Enterobacteriaceae species DETECTED (A)  NOT DETECTED Final    Comment: Enterobacteriaceae represent a large family of gram-negative bacteria, not a single organism. CRITICAL RESULT CALLED TO, READ BACK BY AND VERIFIED WITH: HANK ZOMPA AT 9379 ON 12/20/17 [place].    Enterobacter cloacae complex NOT DETECTED NOT DETECTED Final   Escherichia coli DETECTED (  A) NOT DETECTED Final    Comment: CRITICAL RESULT CALLED TO, READ BACK BY AND VERIFIED WITH: HANK ZOMPA AT 8546 ON 12/20/17 [place].    Klebsiella oxytoca NOT DETECTED NOT DETECTED Final   Klebsiella pneumoniae NOT DETECTED NOT DETECTED Final   Proteus species NOT DETECTED NOT DETECTED Final   Serratia marcescens NOT DETECTED NOT DETECTED Final   Carbapenem resistance NOT DETECTED NOT DETECTED Final   Haemophilus influenzae NOT DETECTED NOT DETECTED Final   Neisseria meningitidis NOT DETECTED NOT DETECTED Final   Pseudomonas aeruginosa NOT DETECTED NOT DETECTED Final   Candida albicans NOT DETECTED NOT DETECTED Final   Candida glabrata NOT DETECTED NOT DETECTED Final   Candida krusei NOT DETECTED NOT DETECTED Final   Candida parapsilosis NOT DETECTED NOT DETECTED Final   Candida tropicalis NOT DETECTED NOT DETECTED Final    Comment: Performed at [facility], [address] [place], [place] [address]  Blood Culture (routine x 2)     Status: Abnormal   Collection Time: 12/19/17  7:40 PM  Result Value Ref Range Status   Specimen Description   Final    BLOOD RFOA Performed at [facility], [address], [place], [place] [address]    Special Requests   Final    BOTTLES DRAWN AEROBIC AND ANAEROBIC Blood Culture adequate volume Performed at [facility], [address] [place], [place] [address]    Culture  Setup Time   Final    GRAM NEGATIVE RODS ANAEROBIC BOTTLE ONLY CRITICAL RESULT CALLED TO, READ BACK BY AND VERIFIED WITH: HANK ZOMPA AT 3716 ON 12/20/17 [place]. Performed at [facility], [address] [place], [place] [address]     Culture (A)  Final    ESCHERICHIA COLI SUSCEPTIBILITIES PERFORMED ON PREVIOUS CULTURE WITHIN THE LAST 5 DAYS. Performed at [facility], [place] [address] [place], [place] [address]    Report Status 12/22/2017 FINAL  Final  Urine culture     Status: Abnormal   Collection Time: 12/19/17  7:40 PM  Result Value Ref Range Status   Specimen Description   Final    URINE, RANDOM Performed at [facility], [address], [place], [place] [address]    Special Requests   Final    NONE Performed at [facility], [address], [place], [place] [address]    Culture (A)  Final    <10,000 COLONIES/mL Performed at [facility], [place] [address] [place], [place] [address]    Report Status 12/21/2017 FINAL  Final  MRSA PCR Screening     Status: None   Collection Time: 12/19/17 11:11 PM  Result Value Ref Range Status   MRSA by PCR NEGATIVE NEGATIVE Final    Comment:        The GeneXpert MRSA Assay (FDA approved for NASAL specimens only), is one component of a comprehensive MRSA colonization surveillance program. It is not intended to diagnose MRSA infection nor to guide or monitor treatment for MRSA infections. Performed at [facility], [address] [place], [place] [address]   CULTURE, BLOOD (ROUTINE X 2) w Reflex to ID Panel     Status: None (Preliminary result)   Collection Time: 12/21/17  5:57 AM  Result Value Ref Range Status   Specimen Description BLOOD RIGHT HAND  Final   Special Requests   Final    BOTTLES DRAWN AEROBIC AND ANAEROBIC Blood Culture adequate volume   Culture   Final  NO GROWTH 3 DAYS Performed at [facility], [address] [place], [place] [address]    Report Status PENDING  Incomplete  CULTURE, BLOOD (ROUTINE X 2) w Reflex to ID Panel     Status: None (Preliminary result)   Collection Time: 12/21/17  5:57 AM  Result Value Ref Range Status   Specimen Description BLOOD LEFT ANTECUBITAL  Final    Special Requests   Final    BOTTLES DRAWN AEROBIC AND ANAEROBIC Blood Culture adequate volume   Culture   Final    NO GROWTH 3 DAYS Performed at [facility], [address] [place], [place] [address]    Report Status PENDING  Incomplete  C difficile quick scan w PCR reflex     Status: None   Collection Time: 12/21/17 10:24 AM  Result Value Ref Range Status   C Diff antigen NEGATIVE NEGATIVE Final   C Diff toxin NEGATIVE NEGATIVE Final   C Diff interpretation No C. difficile detected.  Final    Comment: Performed at [facility], [address] [place], [place] [address]     Studies: No results found.  Scheduled Meds: . dronabinol  5 mg Oral BID AC  . feeding supplement  1 Container Oral TID BM  . multivitamin with minerals  1 tablet Oral Daily  . nystatin  5 mL Oral QID  . pantoprazole (PROTONIX) IV  40 mg Intravenous Q24H  . potassium & sodium phosphates  1 packet Oral TID AC & HS  . potassium chloride  40 mEq Oral Once  . simvastatin  20 mg Oral QHS   Continuous Infusions: .  ceFAZolin (ANCEF) IV Stopped (12/24/17 0536)    Assessment/Plan:  1. E. coli sepsis and neutropenic fever.  Patient switched to Ancef.  Source could be rectal inflammation seen on CT scan yesterday.  White blood cell count up to 2.7 with 54% neutrophils. 2. Recurrent follicular lymphoma and pancytopenia.  Patient is status post 2 platelet transfusion and packed red blood cell transfusion.  Blood counts acceptable today 3. Hypokalemia and hypomagnesemia.  Replace potassium again today 4. Hypophosphatemia.  Phosphorus replaced 5. Nausea vomiting.  Seems a little bit better today.  PRN medications.  Patient started on Marinol 6. Diarrhea.  Started on Imodium 7. Hyperlipidemia unspecified on simvastatin 8. Hyponatremia.  9. Weakness.  Physical therapy recommended rehab.  I would rather not send the patient to rehab that is immunocompromised.  Hopefully the patient did get stronger  over the next couple days and then go home.   Code Status:     Code Status Orders  (From admission, onward)        Start     Ordered   12/19/17 2244  Full code  Continuous     12/19/17 2243    Code Status History    Date Active Date Inactive Code Status Order ID Comments User Context   This patient has a current code status but no historical code status.    Advance Directive Documentation     Most Recent Value  Type of Advance Directive  Living will  Pre-existing out of facility DNR order (yellow form or pink MOST form)  No data  "MOST" Form in Place?  No data     Family Communication: Daughter at bedside Disposition Plan: Physical therapy recommended rehab.  Would rather not send an immunocompromise patient to rehab if I do not have to.  Patient would need a private room if does go to  rehab.  Consultants:  Critical care specialist  Oncology  Infectious disease  Antibiotics:  Ancef  Time spent: 30 minutes including ACP time.  Veronica Boyd

## 2017-12-25 LAB — CBC WITH DIFFERENTIAL/PLATELET
BASOS ABS: 0 10*3/uL (ref 0–0.1)
BASOS PCT: 0 %
Eosinophils Absolute: 0 10*3/uL (ref 0–0.7)
Eosinophils Relative: 0 %
HCT: 27.4 % — ABNORMAL LOW (ref 35.0–47.0)
HEMOGLOBIN: 9.2 g/dL — AB (ref 12.0–16.0)
LYMPHS PCT: 3 %
Lymphs Abs: 0.1 10*3/uL — ABNORMAL LOW (ref 1.0–3.6)
MCH: 31.5 pg (ref 26.0–34.0)
MCHC: 33.6 g/dL (ref 32.0–36.0)
MCV: 93.8 fL (ref 80.0–100.0)
Monocytes Absolute: 1.7 10*3/uL — ABNORMAL HIGH (ref 0.2–0.9)
Monocytes Relative: 37 %
NEUTROS PCT: 60 %
Neutro Abs: 2.6 10*3/uL (ref 1.4–6.5)
PLATELETS: 55 10*3/uL — AB (ref 150–440)
RBC: 2.92 MIL/uL — ABNORMAL LOW (ref 3.80–5.20)
RDW: 20.9 % — ABNORMAL HIGH (ref 11.5–14.5)
WBC: 4.4 10*3/uL (ref 3.6–11.0)

## 2017-12-25 LAB — BASIC METABOLIC PANEL
Anion gap: 9 (ref 5–15)
BUN: 7 mg/dL (ref 6–20)
CALCIUM: 8.3 mg/dL — AB (ref 8.9–10.3)
CO2: 24 mmol/L (ref 22–32)
Chloride: 98 mmol/L — ABNORMAL LOW (ref 101–111)
Creatinine, Ser: 0.39 mg/dL — ABNORMAL LOW (ref 0.44–1.00)
Glucose, Bld: 141 mg/dL — ABNORMAL HIGH (ref 65–99)
POTASSIUM: 3.2 mmol/L — AB (ref 3.5–5.1)
SODIUM: 131 mmol/L — AB (ref 135–145)

## 2017-12-25 LAB — GLUCOSE, CAPILLARY: GLUCOSE-CAPILLARY: 97 mg/dL (ref 65–99)

## 2017-12-25 MED ORDER — POTASSIUM CHLORIDE 20 MEQ PO PACK
20.0000 meq | PACK | Freq: Every day | ORAL | Status: DC
  Administered 2017-12-26 – 2017-12-27 (×2): 20 meq via ORAL
  Filled 2017-12-25 (×2): qty 1

## 2017-12-25 MED ORDER — POTASSIUM CHLORIDE 20 MEQ PO PACK
40.0000 meq | PACK | Freq: Once | ORAL | Status: AC
  Administered 2017-12-25: 40 meq via ORAL
  Filled 2017-12-25: qty 2

## 2017-12-25 NOTE — Progress Notes (Signed)
Patient's white blood cell count is now within normal limits and she is no longer neutropenic.  Her hemoglobin and platelet count remains decreased, but stable.  She does not require a transfusion at this time.  Please ensure patient keeps her previously scheduled follow-up appointment in the [facility] on January 02, 2018.  Will follow.

## 2017-12-25 NOTE — Progress Notes (Signed)
Pharmacy Antibiotic Note  Veronica Boyd is a 72 y.o. female admitted on 12/19/2017 with E. coli bacteremia.  Pharmacy has been consulted for cefazolin dosing.   Plan Continue cefazolin 2 g IV q8h  Height: 4\' 11"  (149.9 cm) Weight: 109 lb 9.1 oz (49.7 kg) IBW/kg (Calculated) : 43.2  Temp (24hrs), Avg:98.2 F (36.8 C), Min:98.1 F (36.7 C), Max:98.2 F (36.8 C)  Recent Labs  Lab 12/19/17 1910 12/19/17 2204  12/20/17 1835 12/20/17 2348 12/21/17 0549 12/22/17 0343 12/23/17 0446 12/24/17 0455 12/25/17 0451 12/25/17 0947  WBC 0.1*  --    < >  --  0.1* 0.2*  --  1.4* 2.7* 4.4  --   CREATININE 0.83  --    < >  --  0.35* 0.38* 0.33* 0.43*  --   --  0.39*  LATICACIDVEN 2.7* 3.1*  --  0.9  --  1.0  --   --   --   --   --    < > = values in this interval not displayed.    Estimated Creatinine Clearance: 42.1 mL/min (A) (by C-G formula based on SCr of 0.39 mg/dL (L)).    No Known Allergies  Antimicrobials this admission: cefazolin 2/17 >>  meropenem 2/15 >> 2/17 Vancomycin/zosyn 2/14 > 2/15  Dose adjustments this admission:  Microbiology results: 2/14 BCx: E. Coli, pan-sensitive   Thank you for allowing pharmacy to be a part of this patient's care.  Rocky Morel, PharmD 12/25/2017 1:18 PM

## 2017-12-25 NOTE — Progress Notes (Signed)
[facility] INFECTIOUS DISE[facility] PROGRESS NOTE Date of Admission:  12/19/2017     ID: Veronica Boyd is a 72 y.o. female with NTP fever, E coli bacteremia Active Problems:   Sepsis ([place])   Anemia   Thrombocytopenia (HCC)   Protein-calorie malnutrition, severe   Subjective: No fevers, a little stronger Ate 1/2 sandwich. No diarrhea since yesterday ROS  Eleven systems are reviewed and negative except per hpi  Medications:  Antibiotics Given (last 72 hours)    Date/Time Action Medication Dose Rate   12/22/17 1456 New Bag/Given   ceFAZolin (ANCEF) IVPB 2g/100 mL premix 2 g 200 mL/hr   12/22/17 2057 New Bag/Given   ceFAZolin (ANCEF) IVPB 2g/100 mL premix 2 g 200 mL/hr   12/23/17 0634 New Bag/Given   ceFAZolin (ANCEF) IVPB 2g/100 mL premix 2 g 200 mL/hr   12/23/17 1500 New Bag/Given   ceFAZolin (ANCEF) IVPB 2g/100 mL premix 2 g 200 mL/hr   12/23/17 2203 New Bag/Given   ceFAZolin (ANCEF) IVPB 2g/100 mL premix 2 g 200 mL/hr   12/24/17 0506 New Bag/Given   ceFAZolin (ANCEF) IVPB 2g/100 mL premix 2 g 200 mL/hr   12/24/17 1649 New Bag/Given   ceFAZolin (ANCEF) IVPB 2g/100 mL premix 2 g 200 mL/hr   12/24/17 2214 New Bag/Given   ceFAZolin (ANCEF) IVPB 2g/100 mL premix 2 g 200 mL/hr   12/25/17 0500 New Bag/Given   ceFAZolin (ANCEF) IVPB 2g/100 mL premix 2 g 200 mL/hr     . dronabinol  5 mg Oral BID AC  . feeding supplement  1 Container Oral TID BM  . multivitamin with minerals  1 tablet Oral Daily  . nystatin  5 mL Oral QID  . pantoprazole (PROTONIX) IV  40 mg Intravenous Q24H  . [START ON 12/26/2017] potassium chloride  20 mEq Oral Daily  . potassium chloride  40 mEq Oral Once  . simvastatin  20 mg Oral QHS    Objective: Vital signs in last 24 hours: Temp:  [98.1 F (36.7 C)-98.3 F (36.8 C)] 98.3 F (36.8 C) (02/20 1359) Pulse Rate:  [99-136] 107 (02/20 1359) Resp:  [15-18] 18 (02/20 1359) BP: (100-151)/(38-76) 100/52 (02/20 1359) SpO2:  [99 %-100 %] 99 % (02/20  1359) Weight:  [49.7 kg (109 lb 9.1 oz)] 49.7 kg (109 lb 9.1 oz) (02/20 0411) Constitutional:  oriented to person, place, and time. Thin, chronicaly ill appearing HENT: [place]/AT, PERRLA, no scleral icterus pale Mouth/Throat: Oropharynx is clear and dry . No oropharyngeal exudate.  Cardiovascular: Normal rate, regular rhythm and normal heart sounds. Pulmonary/Chest: Effort normal and breath sounds normal. No respiratory distress.  has no wheezes.  Neck = supple, no nuchal rigidity Abdominal: Soft. Bowel sounds are normal.  exhibits no distension. There is no tenderness.  Lymphadenopathy: no cervical adenopathy. No axillary adenopathy Portacath site wnl Neurological: alert and oriented to person, place, and time.  Skin: Skin is warm and dry. Some bruising Psychiatric: a normal mood and affect.  behavior is normal.      Lab Results Recent Labs    12/23/17 0446 12/24/17 0455 12/25/17 0451 12/25/17 0947  WBC 1.4* 2.7* 4.4  --   HGB 9.9* 9.7* 9.2*  --   HCT 29.2* 28.3* 27.4*  --   NA 131*  --   --  131*  K 3.7 3.1*  --  3.2*  CL 98*  --   --  98*  CO2 25  --   --  24  BUN 7  --   --  7  CREATININE 0.43*  --   --  0.39*    Microbiology: Results for orders placed or performed during the hospital encounter of 12/19/17  Blood Culture (routine x 2)     Status: Abnormal   Collection Time: 12/19/17  7:10 PM  Result Value Ref Range Status   Specimen Description   Final    BLOOD LEFT WRIST Performed at [facility], [address], [place], [place] [address]    Special Requests   Final    BOTTLES DRAWN AEROBIC AND ANAEROBIC Blood Culture adequate volume Performed at [facility], [address], [place], [place] [address]    Culture  Setup Time   Final    GRAM NEGATIVE RODS IN BOTH AEROBIC AND ANAEROBIC BOTTLES CRITICAL RESULT CALLED TO, READ BACK BY AND VERIFIED WITH: HANK ZOMPA AT 0272 ON 12/20/17 [place]. Performed at [facility], [place] [address]  [place], [place] [address]    Culture ESCHERICHIA COLI (A)  Final   Report Status 12/22/2017 FINAL  Final   Organism ID, Bacteria ESCHERICHIA COLI  Final      Susceptibility   Escherichia coli - MIC*    AMPICILLIN 8 SENSITIVE Sensitive     CEFAZOLIN <=4 SENSITIVE Sensitive     CEFEPIME <=1 SENSITIVE Sensitive     CEFTAZIDIME <=1 SENSITIVE Sensitive     CEFTRIAXONE <=1 SENSITIVE Sensitive     CIPROFLOXACIN <=0.25 SENSITIVE Sensitive     GENTAMICIN <=1 SENSITIVE Sensitive     IMIPENEM <=0.25 SENSITIVE Sensitive     TRIMETH/SULFA <=20 SENSITIVE Sensitive     AMPICILLIN/SULBACTAM <=2 SENSITIVE Sensitive     PIP/TAZO <=4 SENSITIVE Sensitive     Extended ESBL NEGATIVE Sensitive     * ESCHERICHIA COLI  Blood Culture ID Panel (Reflexed)     Status: Abnormal   Collection Time: 12/19/17  7:10 PM  Result Value Ref Range Status   Enterococcus species NOT DETECTED NOT DETECTED Final   Listeria monocytogenes NOT DETECTED NOT DETECTED Final   Staphylococcus species NOT DETECTED NOT DETECTED Final   Staphylococcus aureus NOT DETECTED NOT DETECTED Final   Streptococcus species NOT DETECTED NOT DETECTED Final   Streptococcus agalactiae NOT DETECTED NOT DETECTED Final   Streptococcus pneumoniae NOT DETECTED NOT DETECTED Final   Streptococcus pyogenes NOT DETECTED NOT DETECTED Final   Acinetobacter baumannii NOT DETECTED NOT DETECTED Final   Enterobacteriaceae species DETECTED (A) NOT DETECTED Final    Comment: Enterobacteriaceae represent a large family of gram-negative bacteria, not a single organism. CRITICAL RESULT CALLED TO, READ BACK BY AND VERIFIED WITH: HANK ZOMPA AT 4034 ON 12/20/17 [place].    Enterobacter cloacae complex NOT DETECTED NOT DETECTED Final   Escherichia coli DETECTED (A) NOT DETECTED Final    Comment: CRITICAL RESULT CALLED TO, READ BACK BY AND VERIFIED WITH: HANK ZOMPA AT 7425 ON 12/20/17 [place].    Klebsiella oxytoca NOT DETECTED NOT DETECTED Final   Klebsiella pneumoniae NOT  DETECTED NOT DETECTED Final   Proteus species NOT DETECTED NOT DETECTED Final   Serratia marcescens NOT DETECTED NOT DETECTED Final   Carbapenem resistance NOT DETECTED NOT DETECTED Final   Haemophilus influenzae NOT DETECTED NOT DETECTED Final   Neisseria meningitidis NOT DETECTED NOT DETECTED Final   Pseudomonas aeruginosa NOT DETECTED NOT DETECTED Final   Candida albicans NOT DETECTED NOT DETECTED Final   Candida glabrata NOT DETECTED NOT DETECTED Final   Candida krusei NOT DETECTED NOT DETECTED Final   Candida parapsilosis NOT DETECTED  NOT DETECTED Final   Candida tropicalis NOT DETECTED NOT DETECTED Final    Comment: Performed at [facility], [address] [place], [place] [address]  Blood Culture (routine x 2)     Status: Abnormal   Collection Time: 12/19/17  7:40 PM  Result Value Ref Range Status   Specimen Description   Final    BLOOD RFOA Performed at [facility], [address], [place], [place] [address]    Special Requests   Final    BOTTLES DRAWN AEROBIC AND ANAEROBIC Blood Culture adequate volume Performed at [facility], [address] [place], [place] [address]    Culture  Setup Time   Final    GRAM NEGATIVE RODS ANAEROBIC BOTTLE ONLY CRITICAL RESULT CALLED TO, READ BACK BY AND VERIFIED WITH: HANK ZOMPA AT 7616 ON 12/20/17 [place]. Performed at [facility], [address] [place], [place] [address]    Culture (A)  Final    ESCHERICHIA COLI SUSCEPTIBILITIES PERFORMED ON PREVIOUS CULTURE WITHIN THE LAST 5 DAYS. Performed at [facility], [place] [address], [place], [place] [address]    Report Status 12/22/2017 FINAL  Final  Urine culture     Status: Abnormal   Collection Time: 12/19/17  7:40 PM  Result Value Ref Range Status   Specimen Description   Final    URINE, RANDOM Performed at [facility], [address] [place] [address], [place], [place] [address]    Special Requests   Final    NONE Performed at [facility], [address], [place], [place] [address]    Culture (A)  Final    <10,000 COLONIES/mL Performed at [facility], [place] [address] [place], [place] [address]    Report Status 12/21/2017 FINAL  Final  MRSA PCR Screening     Status: None   Collection Time: 12/19/17 11:11 PM  Result Value Ref Range Status   MRSA by PCR NEGATIVE NEGATIVE Final    Comment:        The GeneXpert MRSA Assay (FDA approved for NASAL specimens only), is one component of a comprehensive MRSA colonization surveillance program. It is not intended to diagnose MRSA infection nor to guide or monitor treatment for MRSA infections. Performed at [facility], [address] [place], [place] [address]   CULTURE, BLOOD (ROUTINE X 2) w Reflex to ID Panel     Status: None (Preliminary result)   Collection Time: 12/21/17  5:57 AM  Result Value Ref Range Status   Specimen Description BLOOD RIGHT HAND  Final   Special Requests   Final    BOTTLES DRAWN AEROBIC AND ANAEROBIC Blood Culture adequate volume   Culture   Final    NO GROWTH 4 DAYS Performed at [facility], [address], [place], [place] [address]    Report Status PENDING  Incomplete  CULTURE, BLOOD (ROUTINE X 2) w Reflex to ID Panel     Status: None (Preliminary result)   Collection Time: 12/21/17  5:57 AM  Result Value Ref Range Status   Specimen Description BLOOD LEFT ANTECUBITAL  Final   Special Requests   Final    BOTTLES DRAWN AEROBIC AND ANAEROBIC Blood Culture adequate volume   Culture   Final    NO GROWTH 4 DAYS Performed at [facility], [address] [place], [place] [address]    Report Status PENDING  Incomplete  C difficile quick scan w PCR reflex  Status: None   Collection Time: 12/21/17 10:24 AM  Result Value Ref Range Status   C Diff antigen NEGATIVE NEGATIVE Final   C Diff toxin NEGATIVE NEGATIVE Final   C Diff interpretation No C. difficile detected.  Final    Comment:  Performed at [facility], [address] [place], [place] [address]    Studies/Results: No results found.  Assessment/Plan: Veronica Boyd is a 72 y.o. 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 Improving with IV abx. CT shows probably proctitis as source of bacteremia.   2/19 - no fever, anc up to 1.5  2/21- no fever anc 1.7.   Recommendations Would continue IV cefazolin while inpatient. At time of dc can send home on oral keflex for a total 14 day course in total abx- stop date 2/28 If fevers recur broaden coverage to include other enteric pathogens. Portacath infections with gram neg rods are unusual so would not remove the cath at this time.  However I can see towards end of abx course and would suggest to repeat BCX once of abx to document clearance. I will sign off.  Thank you very much for the consult.  Leonel Ramsay   12/25/2017, 2:23 PM

## 2017-12-25 NOTE — Plan of Care (Signed)
  Progressing Education: Knowledge of General Education information will improve 12/25/2017 1519 - Progressing by Cherylann Parr, RN Health Behavior/Discharge Planning: Ability to manage health-related needs will improve 12/25/2017 1519 - Progressing by Cherylann Parr, RN Clinical Measurements: Ability to maintain clinical measurements within normal limits will improve 12/25/2017 1519 - Progressing by Cherylann Parr, RN Will remain free from infection 12/25/2017 1519 - Progressing by Cherylann Parr, RN Diagnostic test results will improve 12/25/2017 1519 - Progressing by Cherylann Parr, RN Respiratory complications will improve 12/25/2017 1519 - Progressing by Cherylann Parr, RN Cardiovascular complication will be avoided 12/25/2017 1519 - Progressing by Cherylann Parr, RN Activity: Risk for activity intolerance will decrease 12/25/2017 1519 - Progressing by Cherylann Parr, RN Nutrition: Adequate nutrition will be maintained 12/25/2017 1519 - Progressing by Cherylann Parr, RN Coping: Level of anxiety will decrease 12/25/2017 1519 - Progressing by Cherylann Parr, RN Elimination: Will not experience complications related to bowel motility 12/25/2017 1519 - Progressing by Cherylann Parr, RN Will not experience complications related to urinary retention 12/25/2017 1519 - Progressing by Cherylann Parr, RN Pain Managment: General experience of comfort will improve 12/25/2017 1519 - Progressing by Cherylann Parr, RN Safety: Ability to remain free from injury will improve 12/25/2017 1519 - Progressing by Cherylann Parr, RN Skin Integrity: Risk for impaired skin integrity will decrease 12/25/2017 1519 - Progressing by Cherylann Parr, RN

## 2017-12-25 NOTE — Progress Notes (Signed)
Veronica Boyd at [place] NAME: Veronica Boyd    MR#:  [id number]  DATE OF BIRTH:  12-Apr-1946  SUBJECTIVE:  CHIEF COMPLAINT:   Chief Complaint  Patient presents with  . Fever  no complaints  REVIEW OF SYSTEMS:  CONSTITUTIONAL: No fever, fatigue or weakness.  EYES: No blurred or double vision.  EARS, NOSE, AND THROAT: No tinnitus or ear pain.  RESPIRATORY: No cough, shortness of breath, wheezing or hemoptysis.  CARDIOVASCULAR: No chest pain, orthopnea, edema.  GASTROINTESTINAL: No nausea, vomiting, diarrhea or abdominal pain.  GENITOURINARY: No dysuria, hematuria.  ENDOCRINE: No polyuria, nocturia,  HEMATOLOGY: No anemia, easy bruising or bleeding SKIN: No rash or lesion. MUSCULOSKELETAL: No joint pain or arthritis.   NEUROLOGIC: No tingling, numbness, weakness.  PSYCHIATRY: No anxiety or depression.   ROS  DRUG ALLERGIES:  No Known Allergies  VITALS:  Blood pressure 104/62, pulse (!) 109, temperature 98.1 F (36.7 C), temperature source Oral, resp. rate 15, height 4\' 11"  (1.499 m), weight 49.7 kg (109 lb 9.1 oz), SpO2 99 %.  PHYSICAL EXAMINATION:  GENERAL:  72 y.o.-year-old patient lying in the bed with no acute distress.  EYES: Pupils equal, round, reactive to light and accommodation. No scleral icterus. Extraocular muscles intact.  HEENT: Head atraumatic, normocephalic. Oropharynx and nasopharynx clear.  NECK:  Supple, no jugular venous distention. No thyroid enlargement, no tenderness.  LUNGS: Normal breath sounds bilaterally, no wheezing, rales,rhonchi or crepitation. No use of accessory muscles of respiration.  CARDIOVASCULAR: S1, S2 normal. No murmurs, rubs, or gallops.  ABDOMEN: Soft, nontender, nondistended. Bowel sounds present. No organomegaly or mass.  EXTREMITIES: No pedal edema, cyanosis, or clubbing.  NEUROLOGIC: Cranial nerves II through XII are intact. Muscle strength 5/5 in all extremities. Sensation intact. Gait not  checked.  PSYCHIATRIC: The patient is alert and oriented x 3.  SKIN: No obvious rash, lesion, or ulcer.   Physical Exam LABORATORY PANEL:   CBC Recent Labs  Lab 12/25/17 0451  WBC 4.4  HGB 9.2*  HCT 27.4*  PLT 55*   ------------------------------------------------------------------------------------------------------------------  Chemistries  Recent Labs  Lab 12/20/17 2348  12/22/17 1600  12/25/17 0947  NA 131*   < >  --    < > 131*  K 2.4*   < > 4.3   < > 3.2*  CL 101   < >  --    < > 98*  CO2 22   < >  --    < > 24  GLUCOSE 88   < >  --    < > 141*  BUN 7   < >  --    < > 7  CREATININE 0.35*   < >  --    < > 0.39*  CALCIUM 7.6*   < >  --    < > 8.3*  MG 1.7   < > 1.8  --   --   AST 15  --   --   --   --   ALT 10*  --   --   --   --   ALKPHOS 33*  --   --   --   --   BILITOT 1.8*  --   --   --   --    < > = values in this interval not displayed.   ------------------------------------------------------------------------------------------------------------------  Cardiac Enzymes Recent Labs  Lab 12/21/17 0549 12/21/17 1242  TROPONINI 0.06* 0.05*   ------------------------------------------------------------------------------------------------------------------  RADIOLOGY:  No results found.  ASSESSMENT AND PLAN:  1. E. coli sepsis and neutropenic fever Resolved ID following, plans for Ancef changed to keflex for 14 day course/no need to remove Port 2. Recurrent follicular lymphoma and pancytopenia Hemoglobin stable Status post 2 platelet transfusion and packed red blood cell transfusion 3. Hypokalemia and hypomagnesemia Replete with PO potassium  4. Hypophosphatemia Repleted 5. Nausea vomiting Resolved on Marinol 6. Diarrhea Stable on  Imodium 7. Weakness Physical therapy recommended rehab Wound rather not send the patient to rehab that is immunocompromised.  Hopefully the patient did get stronger over the next couple days and then go home.  All  the records are reviewed and case discussed with Care Management/Social Workerr. Management plans discussed with the patient, family and they are in agreement.  CODE STATUS: full  TOTAL TIME TAKING CARE OF THIS PATIENT: 45 minutes.     POSSIBLE D/C IN 1-3 DAYS, DEPENDING ON CLINICAL CONDITION.   Avel Peace Veronica Boyd M.D on 12/25/2017   Between 7am to 6pm - Pager - [phone]  After 6pm go to [email or web address] - password [email or web address] [facility]  Office  [phone]  CC: Primary care physician; Glendon Axe, MD  Note: This dictation was prepared with Dragon dictation along with smaller phrase technology. Any transcriptional errors that result from this process are unintentional.

## 2017-12-25 NOTE — Progress Notes (Signed)
Spoke with Dr Jannifer Franklin.  Pt had episode of aflutter with HR up to 160.  Order to continue to monitor, use prn lopressor to keep HR less than 140. Dorna Bloom RN

## 2017-12-26 ENCOUNTER — Other Ambulatory Visit: Payer: Self-pay

## 2017-12-26 LAB — CULTURE, BLOOD (ROUTINE X 2)
Culture: NO GROWTH
Culture: NO GROWTH
SPECIAL REQUESTS: ADEQUATE
Special Requests: ADEQUATE

## 2017-12-26 LAB — BASIC METABOLIC PANEL
Anion gap: 8 (ref 5–15)
BUN: 6 mg/dL (ref 6–20)
CALCIUM: 8.6 mg/dL — AB (ref 8.9–10.3)
CO2: 27 mmol/L (ref 22–32)
CREATININE: 0.48 mg/dL (ref 0.44–1.00)
Chloride: 101 mmol/L (ref 101–111)
GFR calc Af Amer: >60 mL/min (ref >60)
GFR calc non Af Amer: >60 mL/min (ref >60)
Glucose, Bld: 93 mg/dL (ref 65–99)
Potassium: 3.7 mmol/L (ref 3.5–5.1)
Sodium: 136 mmol/L (ref 135–145)

## 2017-12-26 LAB — GLUCOSE, CAPILLARY: Glucose-Capillary: 93 mg/dL (ref 65–99)

## 2017-12-26 MED ORDER — METOPROLOL TARTRATE 25 MG PO TABS
37.5000 mg | ORAL_TABLET | Freq: Two times a day (BID) | ORAL | Status: DC
  Administered 2017-12-26 – 2017-12-27 (×3): 37.5 mg via ORAL
  Filled 2017-12-26 (×3): qty 2

## 2017-12-26 MED ORDER — DIGOXIN 0.25 MG/ML IJ SOLN
0.5000 mg | Freq: Once | INTRAMUSCULAR | Status: AC
  Administered 2017-12-26: 0.5 mg via INTRAVENOUS
  Filled 2017-12-26: qty 2

## 2017-12-26 MED ORDER — DIGOXIN 125 MCG PO TABS
0.1250 mg | ORAL_TABLET | Freq: Every day | ORAL | Status: DC
  Administered 2017-12-27: 0.125 mg via ORAL
  Filled 2017-12-26: qty 1

## 2017-12-26 NOTE — Progress Notes (Signed)
Nutrition Follow-up  DOCUMENTATION CODES:   Severe malnutrition in context of chronic illness  INTERVENTION:  Discontinued Magic Cup from trays.  Continue Boost Breeze po TID, each supplement provides 250 kcal and 9 grams of protein.  Continue daily MVI.  NUTRITION DIAGNOSIS:   Severe Malnutrition related to chronic illness as evidenced by severe fat depletion, severe muscle depletion.  Ongoing.  GOAL:   Patient will meet greater than or equal to 90% of their needs  Progressing.  MONITOR:   PO intake, I & O's, Labs, Supplement acceptance, Weight trends  REASON FOR ASSESSMENT:   Malnutrition Screening Tool, Consult Poor PO  ASSESSMENT:   Veronica Boyd is a 72 yo female  with PMH progressive follicular lymphoma status post fifth chop cycle 1 week ago, who presents with neutropenic fever, sepsis,  Met with patient and her family members at bedside. Patient reports her appetite is getting better now. She ate vegetables with macaroni and cheese at dinner last night. Today she finished most of her breakfast and lunch. For breakfast she had eggs, bacon, breakfast potatoes, and juice. For lunch she had hamburger patty, potato chips, and juice. She does not like the [employer] and wants it taken off her trays. She enjoys the [employer] and she is drinking them. She likes them best in ice.  Medications reviewed and include: Marinol 5 mg BID, Lopressor, MVI daily, nystatin 5 mL QID, pantoprazole, potassium chloride 20 mEq daily, cefazolin.  Labs reviewed and none pertinent from today.  I/O: 700 mL UOP yesterday (0.6 mL/kg/hr)  Weight trend: 49.7 kg on 2/20; +4.2 kg from admission  Diet Order:  Diet regular Room service appropriate? Yes; Fluid consistency: Thin  EDUCATION NEEDS:   Education needs have been addressed  Skin:  Skin Assessment: Reviewed RN Assessment  Last BM:  12/20/2017  Height:   Ht Readings from Last 1 Encounters:  12/19/17 _0  (1.499 m)     Weight:   Wt Readings from Last 1 Encounters:  12/25/17 109 lb 9.1 oz (49.7 kg)    Ideal Body Weight:  44.7 kg  BMI:  Body mass index is 22.13 kg/m.  Estimated Nutritional Needs:   Kcal:  1363-1600 calories (30-35 cal/kg)  Protein:  68-77 grams (1.5-1.7g/kg)  Fluid:  >1.5L  Willey Blade, MS, RD, LDN Office: [phone] Pager: [phone] After Hours/Weekend Pager: [phone]

## 2017-12-26 NOTE — Progress Notes (Signed)
[place]  [phone] [phone]  ID: Veronica Boyd OB: 16-May-1946  MR#: [id number]  OFB#:[id number]  Patient Care Team: Glendon Axe, MD as PCP - General (Internal Medicine) Leonie Green, MD as Referring Physician (Surgery)  CHIEF COMPLAINT: Neutropenic fever, E. coli sepsis, follicular lymphoma.  INTERVAL HISTORY: Patient still significantly weak and fatigued, but states she is improved.  Her appetite is improving as well.  She is no longer neutropenic. Her hemoglobin and platelets continue to be decreased but improved.  With a poor appetite, but states this is improving.  She is no longer febrile.  She offers no further specific complaints.  REVIEW OF SYSTEMS:   Review of Systems  Constitutional: Positive for malaise/fatigue. Negative for fever and weight loss.  Respiratory: Negative.  Negative for cough, hemoptysis and shortness of breath.   Cardiovascular: Negative.  Negative for chest pain and leg swelling.  Gastrointestinal: Negative.  Negative for abdominal pain, blood in stool and melena.  Genitourinary: Negative.   Musculoskeletal: Negative.   Skin: Negative.  Negative for rash.  Neurological: Positive for weakness. Negative for sensory change.  Psychiatric/Behavioral: Negative.  The patient is not nervous/anxious.     As per HPI. Otherwise, a complete review of systems is negative.  PAST MEDICAL HISTORY: Past Medical History:  Diagnosis Date  . Arthritis   . Cataract    bilateral  . Chicken pox   . Colon polyp   . 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 Hyperlipidemia   . Osteoporosis     PAST SURGICAL HISTORY: Past Surgical History:  Procedure Laterality Date  . AXILLARY LYMPH NODE DISSECTION Right 08/21/2016   Procedure: AXILLARY LYMPH NODE excision;  Surgeon: Leonie Green, MD;  Location: [facility];  Service: General;  Laterality: Right;  . CATARACT EXTRACTION, BILATERAL Bilateral     . JOINT REPLACEMENT Left   . PORTA CATH INSERTION N/A 09/16/2017   Procedure: PORTA CATH INSERTION;  Surgeon: Algernon Huxley, MD;  Location: [facility];  Service: Cardiovascular;  Laterality: N/A;  . TONSILLECTOMY    . TOTAL HIP ARTHROPLASTY Left 1992    FAMILY HISTORY: Family History  Problem Relation Age of Onset  . Diabetes Sister   . Lung cancer Brother   . Diabetes Brother   . Basal cell carcinoma Daughter   . Breast cancer Paternal Aunt     ADVANCED DIRECTIVES (Y/N):  @ADVDIR @  HEALTH MAINTENANCE: Social History   Tobacco Use  . Smoking status: Never Smoker  . Smokeless tobacco: Never Used  Substance Use Topics  . Alcohol use: No  . Drug use: No     Colonoscopy:  PAP:  Bone density:  Lipid panel:  No Known Allergies  Current Facility-Administered Medications  Medication Dose Route Frequency Provider Last Rate Last Dose  . acetaminophen (TYLENOL) tablet 650 mg  650 mg Oral Q6H PRN Amelia Jo, MD       Or  . acetaminophen (TYLENOL) suppository 650 mg  650 mg Rectal Q6H PRN Amelia Jo, MD      . albuterol (PROVENTIL) (2.5 MG/3ML) 0.083% nebulizer solution 2.5 mg  2.5 mg Inhalation Q6H PRN Amelia Jo, MD      . ceFAZolin (ANCEF) IVPB 2g/100 mL premix  2 g Intravenous Q8H Lenis Noon, [facility]   Stopped at 12/26/17 1640  . [START ON 12/27/2017] digoxin (LANOXIN) tablet 0.125 mg  0.125 mg Oral Daily Salary, Avel Peace, MD      .  dronabinol (MARINOL) capsule 5 mg  5 mg Oral BID AC Lafayette Dragon, MD   5 mg at 12/26/17 1808  . feeding supplement (BOOST / RESOURCE BREEZE) liquid 1 Container  1 Container Oral TID BM Loletha Grayer, MD   1 Container at 12/26/17 1608  . HYDROcodone-acetaminophen (NORCO/VICODIN) 5-325 MG per tablet 1-2 tablet  1-2 tablet Oral Q4H PRN Amelia Jo, MD   1 tablet at 12/25/17 2131  . lip balm (BLISTEX) ointment   Topical PRN Lafayette Dragon, MD      . liver oil-zinc oxide (DESITIN) 40 % ointment   Topical PRN Dorene Sorrow S, NP      . loperamide (IMODIUM) capsule 2 mg  2 mg Oral PRN Lafayette Dragon, MD   2 mg at 12/22/17 1623  . metoprolol tartrate (LOPRESSOR) tablet 37.5 mg  37.5 mg Oral BID Salary, Montell D, MD   37.5 mg at 12/26/17 1113  . multivitamin with minerals tablet 1 tablet  1 tablet Oral Daily Amelia Jo, MD   1 tablet at 12/26/17 1025  . nystatin (MYCOSTATIN) 100000 UNIT/ML suspension 500,000 Units  5 mL Oral QID Loletha Grayer, MD   500,000 Units at 12/26/17 1602  . ondansetron (ZOFRAN) tablet 4 mg  4 mg Oral Q6H PRN Amelia Jo, MD       Or  . ondansetron [facility]) injection 4 mg  4 mg Intravenous Q6H PRN Amelia Jo, MD   4 mg at 12/25/17 0809  . pantoprazole (PROTONIX) injection 40 mg  40 mg Intravenous Q24H Dorene Sorrow S, NP   40 mg at 12/25/17 2130  . potassium chloride (KLOR-CON) packet 20 mEq  20 mEq Oral Daily Salary, Montell D, MD   20 mEq at 12/26/17 1025  . prochlorperazine (COMPAZINE) tablet 10 mg  10 mg Oral Q6H PRN Amelia Jo, MD      . promethazine (PHENERGAN) injection 6.25 mg  6.25 mg Intravenous Q6H PRN Lafayette Dragon, MD   6.25 mg at 12/20/17 1550  . simvastatin (ZOCOR) tablet 20 mg  20 mg Oral QHS Amelia Jo, MD   20 mg at 12/25/17 2131  . traZODone (DESYREL) tablet 25 mg  25 mg Oral QHS PRN Amelia Jo, MD   25 mg at 12/25/17 2131   Facility-Administered Medications Ordered in Other Encounters  Medication Dose Route Frequency Provider Last Rate Last Dose  . heparin lock flush 100 unit/mL  500 Units Intravenous Once Lloyd Huger, MD      . sodium chloride flush (NS) 0.9 % injection 10 mL  10 mL Intravenous PRN Lloyd Huger, MD   10 mL at 12/04/17 0901  . yttrium-90 injection 70.3 millicurie  50.0 millicurie Intravenous Once [name], MD        OBJECTIVE: Vitals:   12/26/17 0538 12/26/17 2009  BP:  (!) 110/52  Pulse: 95 97  Resp:  16  Temp:  98.9 F (37.2 C)  SpO2:  100%     Body mass index is 22.13 kg/m.     ECOG FS:2 - Symptomatic, <50% confined to bed  General: Thin, no acute distress.  Lying in bed. Eyes: Pink conjunctiva, anicteric sclera. Lungs: Clear to auscultation bilaterally. Heart: Regular rate and rhythm. No rubs, murmurs, or gallops. Abdomen: Soft, nontender, nondistended. No organomegaly noted, normoactive bowel sounds. Musculoskeletal: No edema, cyanosis, or clubbing. Neuro: Alert, answering all questions appropriately. Cranial nerves grossly intact. Skin: No rashes or petechiae noted. Psych: Normal affect.  LAB RESULTS:  Lab Results  Component Value Date   NA 136 12/26/2017   K 3.7 12/26/2017   CL 101 12/26/2017   CO2 27 12/26/2017   GLUCOSE 93 12/26/2017   BUN 6 12/26/2017   CREATININE 0.48 12/26/2017   CALCIUM 8.6 (L) 12/26/2017   PROT 4.8 (L) 12/20/2017   ALBUMIN 2.4 (L) 12/20/2017   AST 15 12/20/2017   ALT 10 (L) 12/20/2017   ALKPHOS 33 (L) 12/20/2017   BILITOT 1.8 (H) 12/20/2017   GFRNONAA >60 12/26/2017   GFRAA >60 12/26/2017    Lab Results  Component Value Date   WBC 4.4 12/25/2017   NEUTROABS 2.6 12/25/2017   HGB 9.2 (L) 12/25/2017   HCT 27.4 (L) 12/25/2017   MCV 93.8 12/25/2017   PLT 55 (L) 12/25/2017     STUDIES: Ct Soft Tissue Neck W Contrast  Result Date: 12/09/2017 CLINICAL DATA:  72 y/o F; restaging of follicular lymphoma diagnosed 10/17 currently receiving chemotherapy. EXAM: CT NECK WITH CONTRAST TECHNIQUE: Multidetector CT imaging of the neck was performed using the standard protocol following the bolus administration of intravenous contrast. CONTRAST:  72mL ISOVUE-300 IOPAMIDOL (ISOVUE-300) INJECTION 61% COMPARISON:  Concurrent CT of chest abdomen and pelvis. 09/12/2017 CT of the neck. 04/19/2017, 11/07/2016, 08/01/2016 PET-CT. FINDINGS: Pharynx and larynx: Normal. No mass or swelling. Salivary glands: No inflammation, mass, or stone. Thyroid: Normal. Lymph nodes: No lymphadenopathy of the neck by imaging criteria. Partially visualized bulky  mass in the right axilla, correlation with concurrent CT of chest findings recommended. Vascular: Mild calcific atherosclerosis of aortic arch and left carotid bifurcation without significant stenosis. Limited intracranial: Negative. Visualized orbits: Negative. Mastoids and visualized paranasal sinuses: Clear. Skeleton: Moderate cervical spondylosis with discogenic degenerative changes greatest at the C4-C6 levels and C3-4 minimal grade 1 anterolisthesis. No acute osseous abnormality identified. Upper chest: Stable scarring in right lung apex. Other: None. IMPRESSION: 1. Decreased size of neck lymph nodes. No residual lymphadenopathy of the neck. 2. Partially visualize bulky mass in right axilla, correlation with concurrent CT of chest findings recommended. Electronically Signed   By: Kristine Garbe M.D.   On: 12/09/2017 15:53   Ct Chest W Contrast  Result Date: 12/09/2017 CLINICAL DATA:  Followup lymphoma. EXAM: CT CHEST, ABDOMEN, AND PELVIS WITH CONTRAST TECHNIQUE: Multidetector CT imaging of the chest, abdomen and pelvis was performed following the standard protocol during bolus administration of intravenous contrast. CONTRAST:  38mL ISOVUE-300 IOPAMIDOL (ISOVUE-300) INJECTION 61% COMPARISON:  09/12/2017 FINDINGS: CT CHEST FINDINGS Cardiovascular: Normal heart size. No pericardial effusion identified. Aortic atherosclerosis. Mediastinum/Nodes: Normal thyroid gland. The trachea appears patent and midline. Normal appearance of the esophagus. Previous enlarged right paratracheal node measures 0.5 cm, image 17 of series 4. Previously 1.8 cm. There has been resolution of previous anterior mediastinal prevascular adenopathy. The index left internal mammary lymph node measures 0.7 cm, image 21 of series 4. Previously 1.6 cm. Index left axillary node has resolved in the interval. Previously noted left breast lesions have resolved in the interval. The large round axillary mass persists measuring 4.1 x 4.3 x  5.4 cm (volume = 50 cm^3). Previously 4.4 x 3.6 x 5.1 cm (volume = 42 cm^3). Lungs/Pleura: There has been resolution of previous right pleural effusion. Resolution of previous right-sided paraspinal soft tissue overlying the right upper lobe. Previous pleural nodularity overlying the right lung has resolved in the interval. 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 No suspicious bone lesions. CT ABDOMEN PELVIS FINDINGS Hepatobiliary: No focal liver abnormality is seen. No  gallstones, gallbladder wall thickening, or biliary dilatation. Pancreas: Unremarkable. No pancreatic ductal dilatation or surrounding inflammatory changes. Spleen: Normal in size without focal abnormality. Adrenals/Urinary Tract: The adrenal glands are normal. Unremarkable appearance of both kidneys. The urinary bladder appears unremarkable. Stomach/Bowel: The stomach is normal. The small bowel loops have a normal course and caliber without obstruction. Normal appearance of the colon. Vascular/Lymphatic: Aortic atherosclerosis without aneurysm. No adenopathy within the abdomen. No pelvic or inguinal adenopathy identified. Reproductive: The uterus is unremarkable.  No adnexal mass. Other: No free fluid or fluid collections identified. No peritoneal nodularity. Musculoskeletal: Previous left hip arthroplasty. There is a lucent lesion involving the left acetabulum measuring 3.6 by 2.3 cm. 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 IMPRESSION: 1. Continued interval response to therapy. When compared with 09/12/2017 there has been resolution of mediastinal, left axillary and left internal mammary adenopathy. The lesions within the right breast have also resolved in the interval. Resolution of right-sided pleural nodularity. 2. There is a persistent enlarged soft tissue mass within the right axilla which is slightly increased in volume compared with the previous exam and worrisome for residual  tumor. Electronically Signed   By: Kerby Moors M.D.   On: 12/09/2017 16:58   Ct Abdomen Pelvis W Contrast  Result Date: 12/20/2017 CLINICAL DATA:  Decreased appetite.  Nausea. EXAM: CT ABDOMEN AND PELVIS WITH CONTRAST TECHNIQUE: Multidetector CT imaging of the abdomen and pelvis was performed using the standard protocol following bolus administration of intravenous contrast. CONTRAST:  49mL ISOVUE-300 IOPAMIDOL (ISOVUE-300) INJECTION 61% COMPARISON:  December 09, 2017 FINDINGS: Lower chest: New pleural effusions are identified, right greater than left, relatively small. Associated atelectasis is noted. A pectus deformity is noted in the lower chest, unchanged. No nodules, masses, or suspicious infiltrates are identified in the lower chest. Hepatobiliary: The liver is normal in appearance. The gallbladder is more distended in the interval with no obvious stones or definitive wall thickening. There is a small amount of fluid adjacent to the gallbladder which is nonspecific given mild ascites elsewhere which will be described below. The portal vein is patent. Pancreas: Unremarkable. No pancreatic ductal dilatation or surrounding inflammatory changes. Spleen: Normal in size without focal abnormality. Adrenals/Urinary Tract: Adrenal glands are normal. The kidneys, ureters, and bladder are normal. Stomach/Bowel: The stomach appears thick walled but is poorly evaluated due to lack of distention. There was no wall thickening on the December 09, 2017 study. The small bowel is normal. There appears to be rectal wall thickening as well such as on series 2, image 68 and coronal image 52. No adjacent fat stranding identified. Scattered colonic diverticuli without diverticulitis. The remainder of the colon is otherwise normal. The appendix is normal in appearance and best seen on coronal imaging. Vascular/Lymphatic: Atherosclerotic changes is seen in the nonaneurysmal aorta. No adenopathy. Reproductive: Uterus and bilateral  adnexa are unremarkable. Other: Ascites in the abdomen adjacent to the liver, spleen, gallbladder, and in the pericolic gutters and pelvis. Musculoskeletal: No acute or significant osseous findings. IMPRESSION: 1. The stomach wall appears thickened. This could be due to poor distention but gastritis is not excluded. Recommend clinical correlation. 2. The rectal wall appears thickened as well, new in the interval, suggesting proctitis. Recommend clinical correlation. 3. New bilateral pleural effusions and ascites. 4. The gallbladder is mildly distended but there is no definitive cholelithiasis or wall thickening. The fluid adjacent to the gallbladder is likely due to the ascites. An ultrasound could better evaluate if warranted. 5. Electronically Signed  By: Dorise Bullion III M.D   On: 12/20/2017 20:07   Ct Abdomen Pelvis W Contrast  Result Date: 12/09/2017 CLINICAL DATA:  Followup lymphoma. EXAM: CT CHEST, ABDOMEN, AND PELVIS WITH CONTRAST TECHNIQUE: Multidetector CT imaging of the chest, abdomen and pelvis was performed following the standard protocol during bolus administration of intravenous contrast. CONTRAST:  52mL ISOVUE-300 IOPAMIDOL (ISOVUE-300) INJECTION 61% COMPARISON:  09/12/2017 FINDINGS: CT CHEST FINDINGS Cardiovascular: Normal heart size. No pericardial effusion identified. Aortic atherosclerosis. Mediastinum/Nodes: Normal thyroid gland. The trachea appears patent and midline. Normal appearance of the esophagus. Previous enlarged right paratracheal node measures 0.5 cm, image 17 of series 4. Previously 1.8 cm. There has been resolution of previous anterior mediastinal prevascular adenopathy. The index left internal mammary lymph node measures 0.7 cm, image 21 of series 4. Previously 1.6 cm. Index left axillary node has resolved in the interval. Previously noted left breast lesions have resolved in the interval. The large round axillary mass persists measuring 4.1 x 4.3 x 5.4 cm (volume = 50  cm^3). Previously 4.4 x 3.6 x 5.1 cm (volume = 42 cm^3). Lungs/Pleura: There has been resolution of previous right pleural effusion. Resolution of previous right-sided paraspinal soft tissue overlying the right upper lobe. Previous pleural nodularity overlying the right lung has resolved in the interval. 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 No suspicious bone lesions. CT ABDOMEN PELVIS FINDINGS Hepatobiliary: No focal liver abnormality is seen. No gallstones, gallbladder wall thickening, or biliary dilatation. Pancreas: Unremarkable. No pancreatic ductal dilatation or surrounding inflammatory changes. Spleen: Normal in size without focal abnormality. Adrenals/Urinary Tract: The adrenal glands are normal. Unremarkable appearance of both kidneys. The urinary bladder appears unremarkable. Stomach/Bowel: The stomach is normal. The small bowel loops have a normal course and caliber without obstruction. Normal appearance of the colon. Vascular/Lymphatic: Aortic atherosclerosis without aneurysm. No adenopathy within the abdomen. No pelvic or inguinal adenopathy identified. Reproductive: The uterus is unremarkable.  No adnexal mass. Other: No free fluid or fluid collections identified. No peritoneal nodularity. Musculoskeletal: Previous left hip arthroplasty. There is a lucent lesion involving the left acetabulum measuring 3.6 by 2.3 cm. 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 IMPRESSION: 1. Continued interval response to therapy. When compared with 09/12/2017 there has been resolution of mediastinal, left axillary and left internal mammary adenopathy. The lesions within the right breast have also resolved in the interval. Resolution of right-sided pleural nodularity. 2. There is a persistent enlarged soft tissue mass within the right axilla which is slightly increased in volume compared with the previous exam and worrisome for residual tumor. Electronically  Signed   By: Kerby Moors M.D.   On: 12/09/2017 16:58   Dg Chest Port 1 View  Result Date: 12/19/2017 CLINICAL DATA:  Fever.  History of lymphoma. EXAM: PORTABLE CHEST 1 VIEW COMPARISON:  Chest CT 12/09/2017 and chest x-ray 11/18/2017 FINDINGS: The right IJ Port-A-Cath is stable. The heart is normal in size. Stable tortuosity and calcification of the thoracic aorta. The lungs are clear. No pulmonary lesions or pleural effusion. Stable hazy right upper lobe scarring type changes. The bony thorax is intact. IMPRESSION: No acute pulmonary findings. Stable right upper lobe scarring type changes. Electronically Signed   By: Marijo Sanes M.D.   On: 12/19/2017 19:31    ASSESSMENT: Neutropenic fever, E. coli sepsis, follicular lymphoma.  PLAN:    1.  Neutropenic fever/E. coli sepsis: Patient now afebrile.  Improving.  Patient now on oral antibiotics.  She is no  longer neutropenic.   2.  Follicular lymphoma: Patient receiving R-CHOP chemotherapy with her last treatment approximately 2 weeks ago.  Disease burden improving based on last imaging.  Patient has been instructed to keep her previously scheduled follow-up appointment in the [facility] on January 01, 2018.  She is scheduled for chemotherapy this day, but will likely delay treatment 1-2 weeks until her performance status improves. 3.  Thrombocytopenia: Improving.  No transfusion needed. 4.  Anemia: Hemoglobin decreased but stable, no need for transfusion at this time.  Call with questions.   Cancer Staging Follicular lymphoma ([place]) Staging form: Lymphoid Neoplasms, AJCC 6th Edition - Clinical stage from 08/27/2016: Stage IIE (lymphoma only) - Signed by Lloyd Huger, MD on 08/27/2016   Lloyd Huger, MD   12/26/2017 10:09 PM

## 2017-12-26 NOTE — Plan of Care (Signed)
  Progressing Health Behavior/Discharge Planning: Ability to manage health-related needs will improve 12/26/2017 0501 - Progressing by Denice Bors, RN 12/26/2017 [phone] - Progressing by Denice Bors, RN Clinical Measurements: Ability to maintain clinical measurements within normal limits will improve 12/26/2017 0501 - Progressing by Denice Bors, RN Will remain free from infection 12/26/2017 0501 - Progressing by Denice Bors, RN 12/26/2017 [phone] - Progressing by Denice Bors, RN Diagnostic test results will improve 12/26/2017 0457 - Progressing by Denice Bors, RN Cardiovascular complication will be avoided 12/26/2017 0501 - Progressing by Denice Bors, RN 12/26/2017 [phone] - Adequate for Discharge by Denice Bors, RN Activity: Risk for activity intolerance will decrease 12/26/2017 0501 - Progressing by Denice Bors, RN Coping: Level of anxiety will decrease 12/26/2017 0501 - Progressing by Denice Bors, RN 12/26/2017 [phone] - Progressing by Denice Bors, RN Elimination: Will not experience complications related to bowel motility 12/26/2017 0501 - Progressing by Denice Bors, RN 12/26/2017 [phone] - Progressing by Denice Bors, RN Will not experience complications related to urinary retention 12/26/2017 0501 - Progressing by Denice Bors, RN 12/26/2017 [phone] - Progressing by Denice Bors, RN Pain Managment: General experience of comfort will improve 12/26/2017 0501 - Progressing by Denice Bors, RN 12/26/2017 [phone] - Progressing by Denice Bors, RN Safety: Ability to remain free from injury will improve 12/26/2017 0501 - Progressing by Denice Bors, RN 12/26/2017 [phone] - Adequate for Discharge by Denice Bors, RN

## 2017-12-26 NOTE — Progress Notes (Signed)
Physical Therapy Treatment Patient Details Name: Veronica Boyd MRN: [id number] DOB: Veronica 01, 1947 Today's Date: 12/26/2017    History of Present Illness Pt is a32 y.o.femalewith a known history of recurrent follicular lymphoma,currently undergoing chemotherapy,status post fifth CHOP cycle 1 week ago. Patient was brought to emergency room for fever,as high as 102 and generalized weakness and fatigue going on for the past 24 hours.Symptoms are severe and improved very little with Vicodin at home.Per family the patient has been noted with very poor appetite since on chemotherapy.Her p.o. intake has been particularly poor recently.She denied, however,any new chest pain or shortness of breath;no nausea, vomiting, diarrhea;no cough;no bleeding. Blood test results from emergency room showed pancytopenia with WBC at 0.1,absolute neutrophil count at 0;platelet count 6.Hemoglobin level was 7.Potassium level was low at 2.9.Lactic acid level was elevated at 2.7.Patient was noted with hypotension and tachycardia.Chest x-ray and UA were unremarkable. Patient is admitted for further evaluation and treatment.  Assessment includes: E. coli sepsis and neutropenic fever, recurrent follicular lymphoma and pancytopenia, hyopkalemia, hypomagnesemia, hypophosphatemia, N&V, diarrhea, HLD, and hyponatremia.    PT Comments    Ready for session.  To edge of bed with supervision and increased time.  Use of rails and HOB raised.  Pt is able to sit edge of bed with supervision. Stated she has been trying to sit EOB more during the day and for all meals but fatigues quickly.  Family supervision provided.  Attempted to stand with walker at bedside.  She becomes quite fearful with attempt.  Walker removed and she is able to stand with support of writer x 30 seconds.  She is motivated to try to stand longer but sits due to fatigue.  She is fearful but does complete stand pivot transfer to/from chair at  bedside.  On first attempt, she sits quickly in chair.  Education provided on safety and need to make sure she is fully turned before sitting.  On return she does much better with increased safety.  She is fatigued and returns to supine briefly before getting back up to edge of bed for lunch.  Discussed discharge plan.  Pt and daughter stated the plan is to go to rehab at [facility] upon discharge.   Follow Up Recommendations  SNF     Equipment Recommendations       Recommendations for Other Services       Precautions / Restrictions Precautions Precautions: Fall Restrictions Weight Bearing Restrictions: No    Mobility  Bed Mobility Overal bed mobility: Modified Independent Bed Mobility: Sit to Supine;Supine to Sit     Supine to sit: Supervision Sit to supine: Supervision   General bed mobility comments: with rail and HOB raised.  increased time  Transfers Overall transfer level: Needs assistance   Transfers: Sit to/from Stand;Stand Pivot Transfers Sit to Stand: Min assist Stand pivot transfers: Min assist       General transfer comment: fearful  Ambulation/Gait                 Stairs            Wheelchair Mobility    Modified Rankin (Stroke Patients Only)       Balance Overall balance assessment: Needs assistance Sitting-balance support: Feet supported;Bilateral upper extremity supported Sitting balance-Leahy Scale: Fair     Standing balance support: Bilateral upper extremity supported Standing balance-Leahy Scale: Poor Standing balance comment: Heavy reliance on BUEs to remain standing  Cognition Arousal/Alertness: Awake/alert Behavior During Therapy: WFL for tasks assessed/performed Overall Cognitive Status: Within Functional Limits for tasks assessed                                        Exercises Other Exercises Other Exercises: verbal review of HEP with return demo     General Comments        Pertinent Vitals/Pain Pain Assessment: No/denies pain    Home Living                      Prior Function            PT Goals (current goals can now be found in the care plan section) Progress towards PT goals: Progressing toward goals    Frequency    Min 2X/week      PT Plan Current plan remains appropriate    Co-evaluation              AM-PAC PT "6 Clicks" Daily Activity  Outcome Measure  Difficulty turning over in bed (including adjusting bedclothes, sheets and blankets)?: A Little Difficulty moving from lying on back to sitting on the side of the bed? : A Little Difficulty sitting down on and standing up from a chair with arms (e.g., wheelchair, bedside commode, etc,.)?: Unable Help needed moving to and from a bed to chair (including a wheelchair)?: A Lot Help needed walking in hospital room?: Total Help needed climbing 3-5 steps with a railing? : Total 6 Click Score: 11    End of Session Equipment Utilized During Treatment: Gait belt Activity Tolerance: Patient limited by fatigue Patient left: in bed;with call bell/phone within reach;with family/visitor present         Time: 1220-1238 PT Time Calculation (min) (ACUTE ONLY): 18 min  Charges:  $Therapeutic Activity: 8-22 mins                    G Codes:       Chesley Noon, PTA 12/26/17, 12:47 PM

## 2017-12-26 NOTE — Plan of Care (Signed)
  Progressing Education: Knowledge of General Education information will improve 12/26/2017 0457 - Progressing by Denice Bors, RN Health Behavior/Discharge Planning: Ability to manage health-related needs will improve 12/26/2017 0457 - Progressing by Denice Bors, RN Clinical Measurements: Will remain free from infection 12/26/2017 0457 - Progressing by Denice Bors, RN Diagnostic test results will improve 12/26/2017 0457 - Progressing by Denice Bors, RN Coping: Level of anxiety will decrease 12/26/2017 0457 - Progressing by Denice Bors, RN Elimination: Will not experience complications related to bowel motility 12/26/2017 0457 - Progressing by Denice Bors, RN Will not experience complications related to urinary retention 12/26/2017 0457 - Progressing by Denice Bors, RN Pain Managment: General experience of comfort will improve 12/26/2017 0457 - Progressing by Denice Bors, RN   Adequate for Discharge Clinical Measurements: Respiratory complications will improve 12/26/2017 0457 - Adequate for Discharge by Denice Bors, RN Cardiovascular complication will be avoided 12/26/2017 0457 - Adequate for Discharge by Denice Bors, RN Nutrition: Adequate nutrition will be maintained 12/26/2017 0457 - Adequate for Discharge by Denice Bors, RN Safety: Ability to remain free from injury will improve 12/26/2017 0457 - Adequate for Discharge by Denice Bors, RN Skin Integrity: Risk for impaired skin integrity will decrease 12/26/2017 0457 - Adequate for Discharge by Denice Bors, RN

## 2017-12-26 NOTE — Progress Notes (Addendum)
Pt with AFib.  HR occasionally above 140 BPM but not sustaining.  Will continue to monitor and give prn medication as indicated. Dorna Bloom RN 0522 HR ranging between 112 to 172, maintaining around 152.  Gave metoprolol 2.5 mg IVP. Dorna Bloom RN

## 2017-12-26 NOTE — Progress Notes (Addendum)
[place] [facility] [place] NAME: Veronica Boyd    MR#:  [id number]  DATE OF BIRTH:  Sep 23, 1946  SUBJECTIVE:  CHIEF COMPLAINT:   Chief Complaint  Patient presents with  . Fever  Patient feeling slightly better, continue tachycardia into the 120s, EKG noted, change IV metoprolol to p.o., digoxin x1 now  REVIEW OF SYSTEMS:  CONSTITUTIONAL: No fever, fatigue or weakness.  EYES: No blurred or double vision.  EARS, NOSE, AND THROAT: No tinnitus or ear pain.  RESPIRATORY: No cough, shortness of breath, wheezing or hemoptysis.  CARDIOVASCULAR: No chest pain, orthopnea, edema.  GASTROINTESTINAL: No nausea, vomiting, diarrhea or abdominal pain.  GENITOURINARY: No dysuria, hematuria.  ENDOCRINE: No polyuria, nocturia,  HEMATOLOGY: No anemia, easy bruising or bleeding SKIN: No rash or lesion. MUSCULOSKELETAL: No joint pain or arthritis.   NEUROLOGIC: No tingling, numbness, weakness.  PSYCHIATRY: No anxiety or depression.   ROS  DRUG ALLERGIES:  No Known Allergies  VITALS:  Blood pressure (!) 123/53, pulse 95, temperature 98.4 F (36.9 C), temperature source Oral, resp. rate 18, height 4\' 11"  (1.499 m), weight 49.7 kg (109 lb 9.1 oz), SpO2 99 %.  PHYSICAL EXAMINATION:  GENERAL:  72 y.o.-year-old patient lying in the bed with no acute distress.  EYES: Pupils equal, round, reactive to light and accommodation. No scleral icterus. Extraocular muscles intact.  HEENT: Head atraumatic, normocephalic. Oropharynx and nasopharynx clear.  NECK:  Supple, no jugular venous distention. No thyroid enlargement, no tenderness.  LUNGS: Normal breath sounds bilaterally, no wheezing, rales,rhonchi or crepitation. No use of accessory muscles of respiration.  CARDIOVASCULAR: S1, S2 normal. No murmurs, rubs, or gallops.  ABDOMEN: Soft, nontender, nondistended. Bowel sounds present. No organomegaly or mass.  EXTREMITIES: No pedal edema, cyanosis, or clubbing.   NEUROLOGIC: Cranial nerves II through XII are intact. Muscle strength 5/5 in all extremities. Sensation intact. Gait not checked.  PSYCHIATRIC: The patient is alert and oriented x 3.  SKIN: No obvious rash, lesion, or ulcer.   Physical Exam LABORATORY PANEL:   CBC Recent Labs  Lab 12/25/17 0451  WBC 4.4  HGB 9.2*  HCT 27.4*  PLT 55*   ------------------------------------------------------------------------------------------------------------------  Chemistries  Recent Labs  Lab 12/20/17 2348  12/22/17 1600  12/26/17 0423  NA 131*   < >  --    < > 136  K 2.4*   < > 4.3   < > 3.7  CL 101   < >  --    < > 101  CO2 22   < >  --    < > 27  GLUCOSE 88   < >  --    < > 93  BUN 7   < >  --    < > 6  CREATININE 0.35*   < >  --    < > 0.48  CALCIUM 7.6*   < >  --    < > 8.6*  MG 1.7   < > 1.8  --   --   AST 15  --   --   --   --   ALT 10*  --   --   --   --   ALKPHOS 33*  --   --   --   --   BILITOT 1.8*  --   --   --   --    < > = values in this interval not displayed.   ------------------------------------------------------------------------------------------------------------------  Cardiac Enzymes Recent  Labs  Lab 12/21/17 0549 12/21/17 1242  TROPONINI 0.06* 0.05*   ------------------------------------------------------------------------------------------------------------------  RADIOLOGY:  No results found.  ASSESSMENT AND PLAN:  1. E. coli sepsis and neutropenic fever Resolving ID following, plans for Ancef changed to keflex for 14 day course/no need to remove Port  2. Recurrent follicular lymphoma and pancytopenia Hemoglobin stable Status post 2platelettransfusionand packed red blood cell transfusion  3. Hypokalemia and hypomagnesemia Repleted  4. Hypophosphatemia Repleted  5. Nausea vomiting Resolved on Marinol  6. Diarrhea Stable on  Imodium  7. Weakness  Secondary to cancer, sepsis Physical therapy recommended rehab  8.  Acute sinus  tachycardia Secondary to sepsis Change IV Lopressor to p.o. formulation, IV digoxin x1 now then daily, check digoxin level in the morning   All the records are reviewed and case discussed with Care Management/Social Workerr. Management plans discussed with the patient, family and they are in agreement.  CODE STATUS: full  TOTAL TIME TAKING CARE OF THIS PATIENT: 35 minutes.     POSSIBLE D/C IN 1-2 DAYS, DEPENDING ON CLINICAL CONDITION.   Veronica Boyd M.D on 12/26/2017   Between 7am to 6pm - Pager - [phone]  After 6pm go to [email or web address] - password [email or web address] [facility]  Office  [phone]  CC: Primary care physician; Glendon Axe, MD  Note: This dictation was prepared with Dragon dictation along with smaller phrase technology. Any transcriptional errors that result from this process are unintentional.

## 2017-12-27 ENCOUNTER — Encounter: Admission: RE | Admit: 2017-12-27 | Payer: Medicare Other | Source: Ambulatory Visit

## 2017-12-27 LAB — GLUCOSE, CAPILLARY: Glucose-Capillary: 84 mg/dL (ref 65–99)

## 2017-12-27 LAB — DIGOXIN LEVEL: DIGOXIN LVL: 1 ng/mL (ref 0.8–2.0)

## 2017-12-27 MED ORDER — POTASSIUM CHLORIDE 20 MEQ PO PACK: 20.0000 meq | PACK | Freq: Every day | ORAL | 0 refills | Status: DC

## 2017-12-27 MED ORDER — DIGOXIN 62.5 MCG PO TABS: 0.1250 mg | ORAL_TABLET | Freq: Every day | ORAL | 0 refills | Status: DC

## 2017-12-27 MED ORDER — DRONABINOL 5 MG PO CAPS: 5.0000 mg | ORAL_CAPSULE | Freq: Two times a day (BID) | ORAL | 0 refills | Status: DC

## 2017-12-27 MED ORDER — SODIUM CHLORIDE 0.9% FLUSH: 10.0000 mL | INTRAVENOUS | Status: DC | PRN

## 2017-12-27 MED ORDER — NYSTATIN 100000 UNIT/ML MT SUSP: 5.0000 mL | Freq: Four times a day (QID) | OROMUCOSAL | 0 refills | Status: DC

## 2017-12-27 MED ORDER — ZINC OXIDE 40 % EX OINT: TOPICAL_OINTMENT | CUTANEOUS | 0 refills | Status: DC | PRN

## 2017-12-27 MED ORDER — METOPROLOL TARTRATE 37.5 MG PO TABS: 37.5000 mg | ORAL_TABLET | Freq: Two times a day (BID) | ORAL | 0 refills | Status: DC

## 2017-12-27 MED ORDER — SODIUM CHLORIDE 0.9% FLUSH
10.0000 mL | Freq: Two times a day (BID) | INTRAVENOUS | Status: DC
  Administered 2017-12-27: 10 mL

## 2017-12-27 MED ORDER — HYDROCODONE-ACETAMINOPHEN 5-325 MG PO TABS: 2.0000 | ORAL_TABLET | Freq: Four times a day (QID) | ORAL | 0 refills | Status: DC | PRN

## 2017-12-27 MED ORDER — HEPARIN SOD (PORK) LOCK FLUSH 100 UNIT/ML IV SOLN
500.0000 [IU] | Freq: Once | INTRAVENOUS | Status: AC
  Administered 2017-12-27: 13:00:00 500 [IU] via INTRAVENOUS
  Filled 2017-12-27: qty 5

## 2017-12-27 MED ORDER — CEPHALEXIN 500 MG PO CAPS: 500.0000 mg | ORAL_CAPSULE | Freq: Four times a day (QID) | ORAL | 0 refills | Status: AC

## 2017-12-27 NOTE — Clinical Social Work Note (Signed)
CSW spoke to [employer] and they have received insurance authorization for patient to discharge today.    Patient to be d/c'ed today to [facility].  Patient and family agreeable to plans will transport via ems RN to call report to [phone].  Evette Cristal, MSW, [place]

## 2017-12-27 NOTE — Discharge Summary (Addendum)
[facility] NAME: Veronica Boyd    MR#:  [id number]  DATE OF BIRTH:  [birth year]  DATE OF ADMISSION:  12/19/2017 ADMITTING PHYSICIAN: Amelia Jo, MD  DATE OF DISCHARGE: No discharge date for patient encounter.  PRIMARY CARE PHYSICIAN: Glendon Axe, MD    ADMISSION DIAGNOSIS:  Thrombocytopenia ([place]) [D69.6] Neutropenic fever ([place]) [D70.9, R50.81] Sepsis, due to unspecified organism ([place]) [A41.9] Anemia, unspecified type [D64.9]  DISCHARGE DIAGNOSIS:  Active Problems:   Sepsis ([place])   Anemia   Thrombocytopenia ([facility])   Protein-calorie malnutrition, severe   SECONDARY DIAGNOSIS:   Past Medical History:  Diagnosis Date  . Arthritis   . Cataract    bilateral  . Chicken pox   . Colon polyp   . 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 Hyperlipidemia   . Osteoporosis     HOSPITAL COURSE:  1. E. coli sepsis and neutropenic fever Resolving ID following, plans for Ancef changed to keflex for 14 day course/no need to remove Port  2. Recurrent follicular lymphoma and pancytopenia Hemoglobin stable Status post2platelettransfusionand packed red blood cell transfusion  3. Hypokalemia and hypomagnesemia Repleted  4. Hypophosphatemia Repleted  5. Nausea vomiting Resolved onMarinol  6. Diarrhea Stable onImodium  7. Weakness  Secondary to cancer, sepsis Physical therapy recommended rehab  8.  Acute sinus tachycardia Resolved Secondary to sepsis Continue Lopressor and digoxin-level 1 at discharge   DISCHARGE CONDITIONS:  On day of discharge patient is afebrile, he dynamically stable, tolerating diet, ready for discharge to skilled nursing facility, for more specific details please see chart  CONSULTS OBTAINED:  Treatment Team:  Sindy Guadeloupe, MD  DRUG ALLERGIES:  No Known Allergies  DISCHARGE MEDICATIONS:   Allergies as of 12/27/2017   No Known Allergies      Medication List    STOP taking these medications   bacitracin-polymyxin b ophthalmic ointment Commonly known as:  POLYSPORIN   chlorpheniramine-HYDROcodone 10-8 MG/5ML Suer Commonly known as:  TUSSIONEX   diphenoxylate-atropine 2.5-0.025 MG tablet Commonly known as:  LOMOTIL   morphine 15 MG 12 hr tablet Commonly known as:  MS CONTIN   predniSONE 20 MG tablet Commonly known as:  DELTASONE     TAKE these medications   albuterol 108 (90 Base) MCG/ACT inhaler Commonly known as:  PROVENTIL HFA;VENTOLIN HFA Inhale 2 puffs into the lungs every 6 (six) hours as needed for wheezing or shortness of breath.   CENTRUM SILVER PO Take 1 tablet by mouth daily.   Digoxin 62.5 MCG Tabs Take 0.125 mg by mouth daily. Start taking on:  12/28/2017   dronabinol 5 MG capsule Commonly known as:  MARINOL Take 1 capsule (5 mg total) by mouth 2 (two) times daily before lunch and supper.   HYDROcodone-acetaminophen 5-325 MG tablet Commonly known as:  NORCO/VICODIN Take 2 tablets by mouth every 6 (six) hours as needed for moderate pain.   lidocaine-prilocaine cream Commonly known as:  EMLA Apply 1 application as needed topically.   liver oil-zinc oxide 40 % ointment Commonly known as:  DESITIN Apply topically as needed for irritation.   loperamide 2 MG capsule Commonly known as:  IMODIUM Take 2 mg by mouth as needed for diarrhea or loose stools.   megestrol 40 MG tablet Commonly known as:  MEGACE Take 1 tablet (40 mg total) by mouth daily.   Metoprolol Tartrate 37.5 MG Tabs Take 37.5 mg by mouth 2 (two) times daily.  nystatin 100000 UNIT/ML suspension Commonly known as:  MYCOSTATIN Take 5 mLs (500,000 Units total) by mouth 4 (four) times daily.   ondansetron 8 MG tablet Commonly known as:  ZOFRAN Take 1 tablet (8 mg total) 2 (two) times daily as needed by mouth for refractory nausea / vomiting.   pantoprazole 20 MG tablet Commonly known as:  PROTONIX Take 20 mg by mouth at  bedtime.   potassium chloride 20 MEQ packet Commonly known as:  KLOR-CON Take 20 mEq by mouth daily. Start taking on:  12/28/2017   prochlorperazine 10 MG tablet Commonly known as:  COMPAZINE Take 1 tablet (10 mg total) every 6 (six) hours as needed by mouth (Nausea or vomiting).   simvastatin 20 MG tablet Commonly known as:  ZOCOR Take 20 mg by mouth at bedtime.        DISCHARGE INSTRUCTIONS:   If you experience worsening of your admission symptoms, develop shortness of breath, life threatening emergency, suicidal or homicidal thoughts you must seek medical attention immediately by calling 911 or calling your MD immediately  if symptoms less severe.  You Must read complete instructions/literature along with all the possible adverse reactions/side effects for all the Medicines you take and that have been prescribed to you. Take any new Medicines after you have completely understood and accept all the possible adverse reactions/side effects.   Please note  You were cared for by a hospitalist during your hospital stay. If you have any questions about your discharge medications or the care you received while you were in the hospital after you are discharged, you can call the unit and asked to speak with the hospitalist on call if the hospitalist that took care of you is not available. Once you are discharged, your primary care physician will handle any further medical issues. Please note that NO REFILLS for any discharge medications will be authorized once you are discharged, as it is imperative that you return to your primary care physician (or establish a relationship with a primary care physician if you do not have one) for your aftercare needs so that they can reassess your need for medications and monitor your lab values.    Today   CHIEF COMPLAINT:   Chief Complaint  Patient presents with  . Fever    HISTORY OF PRESENT ILLNESS:  72 y.o. female with a known history of  recurrent follicular lymphoma, currently undergoing chemotherapy, status post fifth CHOP cycle 1 week ago.  Patient was brought to emergency room for fever, as high as 102 and generalized weakness and fatigue going on for the past 24 hours.  Symptoms are severe and improved very little with Vicodin at home.  Per family, who was present at bedside, the patient has been noted with very poor appetite since on chemotherapy.  Her p.o. intake has been particularly poor in the past 24 hours.  She denied, however, any new chest pain or shortness of breath; no nausea, vomiting, diarrhea; no cough; no bleeding. Blood test results from emergency room showed pancytopenia with WBC at 0.1, absolute neutrophil count at 0; platelet count 6.  Hemoglobin level is 7.  Potassium level is low at 2.9.  Lactic acid level is elevated at 2.7. patient is noted with hypotension and tachycardia.  Chest x-ray and UA are unremarkable. Patient is admitted for further evaluation and treatment.  VITAL SIGNS:  Blood pressure 121/62, pulse (!) 104, temperature 98.3 F (36.8 C), temperature source Oral, resp. rate 20, height 4\' 11"  (1.499 m),  weight 49.7 kg (109 lb 9.1 oz), SpO2 100 %.  I/O:    Intake/Output Summary (Last 24 hours) at 12/27/2017 1120 Last data filed at 12/27/2017 0340 Gross per 24 hour  Intake 100 ml  Output 1600 ml  Net -1500 ml    PHYSICAL EXAMINATION:  GENERAL:  72 y.o.-year-old patient lying in the bed with no acute distress.  EYES: Pupils equal, round, reactive to light and accommodation. No scleral icterus. Extraocular muscles intact.  HEENT: Head atraumatic, normocephalic. Oropharynx and nasopharynx clear.  NECK:  Supple, no jugular venous distention. No thyroid enlargement, no tenderness.  LUNGS: Normal breath sounds bilaterally, no wheezing, rales,rhonchi or crepitation. No use of accessory muscles of respiration.  CARDIOVASCULAR: S1, S2 normal. No murmurs, rubs, or gallops.  ABDOMEN: Soft,  non-tender, non-distended. Bowel sounds present. No organomegaly or mass.  EXTREMITIES: No pedal edema, cyanosis, or clubbing.  NEUROLOGIC: Cranial nerves II through XII are intact. Muscle strength 5/5 in all extremities. Sensation intact. Gait not checked.  PSYCHIATRIC: The patient is alert and oriented x 3.  SKIN: No obvious rash, lesion, or ulcer.   DATA REVIEW:   CBC Recent Labs  Lab 12/25/17 0451  WBC 4.4  HGB 9.2*  HCT 27.4*  PLT 55*    Chemistries  Recent Labs  Lab 12/20/17 2348  12/22/17 1600  12/26/17 0423  NA 131*   < >  --    < > 136  K 2.4*   < > 4.3   < > 3.7  CL 101   < >  --    < > 101  CO2 22   < >  --    < > 27  GLUCOSE 88   < >  --    < > 93  BUN 7   < >  --    < > 6  CREATININE 0.35*   < >  --    < > 0.48  CALCIUM 7.6*   < >  --    < > 8.6*  MG 1.7   < > 1.8  --   --   AST 15  --   --   --   --   ALT 10*  --   --   --   --   ALKPHOS 33*  --   --   --   --   BILITOT 1.8*  --   --   --   --    < > = values in this interval not displayed.    Cardiac Enzymes Recent Labs  Lab 12/21/17 1242  TROPONINI 0.05*    Microbiology Results  Results for orders placed or performed during the hospital encounter of 12/19/17  Blood Culture (routine x 2)     Status: Abnormal   Collection Time: 12/19/17  7:10 PM  Result Value Ref Range Status   Specimen Description   Final    BLOOD LEFT WRIST Performed at [facility], [address] [place], [place] [address]    Special Requests   Final    BOTTLES DRAWN AEROBIC AND ANAEROBIC Blood Culture adequate volume Performed at [facility], [address] [place], [place] [address]    Culture  Setup Time   Final    GRAM NEGATIVE RODS IN BOTH AEROBIC AND ANAEROBIC BOTTLES CRITICAL RESULT CALLED TO, READ BACK BY AND VERIFIED WITH: HANK ZOMPA AT 0258 ON 12/20/17 [place]. Performed at [facility], [place] [address], [place], [place]  [address]    Culture ESCHERICHIA COLI (A)  Final   Report  Status 12/22/2017 FINAL  Final   Organism ID, Bacteria ESCHERICHIA COLI  Final      Susceptibility   Escherichia coli - MIC*    AMPICILLIN 8 SENSITIVE Sensitive     CEFAZOLIN <=4 SENSITIVE Sensitive     CEFEPIME <=1 SENSITIVE Sensitive     CEFTAZIDIME <=1 SENSITIVE Sensitive     CEFTRIAXONE <=1 SENSITIVE Sensitive     CIPROFLOXACIN <=0.25 SENSITIVE Sensitive     GENTAMICIN <=1 SENSITIVE Sensitive     IMIPENEM <=0.25 SENSITIVE Sensitive     TRIMETH/SULFA <=20 SENSITIVE Sensitive     AMPICILLIN/SULBACTAM <=2 SENSITIVE Sensitive     PIP/TAZO <=4 SENSITIVE Sensitive     Extended ESBL NEGATIVE Sensitive     * ESCHERICHIA COLI  Blood Culture ID Panel (Reflexed)     Status: Abnormal   Collection Time: 12/19/17  7:10 PM  Result Value Ref Range Status   Enterococcus species NOT DETECTED NOT DETECTED Final   Listeria monocytogenes NOT DETECTED NOT DETECTED Final   Staphylococcus species NOT DETECTED NOT DETECTED Final   Staphylococcus aureus NOT DETECTED NOT DETECTED Final   Streptococcus species NOT DETECTED NOT DETECTED Final   Streptococcus agalactiae NOT DETECTED NOT DETECTED Final   Streptococcus pneumoniae NOT DETECTED NOT DETECTED Final   Streptococcus pyogenes NOT DETECTED NOT DETECTED Final   Acinetobacter baumannii NOT DETECTED NOT DETECTED Final   Enterobacteriaceae species DETECTED (A) NOT DETECTED Final    Comment: Enterobacteriaceae represent a large family of gram-negative bacteria, not a single organism. CRITICAL RESULT CALLED TO, READ BACK BY AND VERIFIED WITH: HANK ZOMPA AT 5400 ON 12/20/17 [place].    Enterobacter cloacae complex NOT DETECTED NOT DETECTED Final   Escherichia coli DETECTED (A) NOT DETECTED Final    Comment: CRITICAL RESULT CALLED TO, READ BACK BY AND VERIFIED WITH: HANK ZOMPA AT 8676 ON 12/20/17 [place].    Klebsiella oxytoca NOT DETECTED NOT DETECTED Final   Klebsiella pneumoniae NOT DETECTED NOT DETECTED Final   Proteus species NOT DETECTED NOT DETECTED Final    Serratia marcescens NOT DETECTED NOT DETECTED Final   Carbapenem resistance NOT DETECTED NOT DETECTED Final   Haemophilus influenzae NOT DETECTED NOT DETECTED Final   Neisseria meningitidis NOT DETECTED NOT DETECTED Final   Pseudomonas aeruginosa NOT DETECTED NOT DETECTED Final   Candida albicans NOT DETECTED NOT DETECTED Final   Candida glabrata NOT DETECTED NOT DETECTED Final   Candida krusei NOT DETECTED NOT DETECTED Final   Candida parapsilosis NOT DETECTED NOT DETECTED Final   Candida tropicalis NOT DETECTED NOT DETECTED Final    Comment: Performed at [facility], [address] [place], [place] [address]  Blood Culture (routine x 2)     Status: Abnormal   Collection Time: 12/19/17  7:40 PM  Result Value Ref Range Status   Specimen Description   Final    BLOOD RFOA Performed at [facility], [address], [place], [place] [address]    Special Requests   Final    BOTTLES DRAWN AEROBIC AND ANAEROBIC Blood Culture adequate volume Performed at [facility], [address] [place], [place] [address]    Culture  Setup Time   Final    GRAM NEGATIVE RODS ANAEROBIC BOTTLE ONLY CRITICAL RESULT CALLED TO, READ BACK BY AND VERIFIED WITH: HANK ZOMPA AT 9983 ON 12/20/17 [place]. Performed at [facility], [address], [place], [place] [address]  Culture (A)  Final    ESCHERICHIA COLI SUSCEPTIBILITIES PERFORMED ON PREVIOUS CULTURE WITHIN THE LAST 5 DAYS. Performed at [facility], [place] [address], [place], [place] [address]    Report Status 12/22/2017 FINAL  Final  Urine culture     Status: Abnormal   Collection Time: 12/19/17  7:40 PM  Result Value Ref Range Status   Specimen Description   Final    URINE, RANDOM Performed at [facility], [address] [place], [place] [address]    Special Requests   Final    NONE Performed at [facility], [address] [place], [place] [address]    Culture (A)  Final     <10,000 COLONIES/mL Performed at [facility], [place] [address], [place], [place] [address]    Report Status 12/21/2017 FINAL  Final  MRSA PCR Screening     Status: None   Collection Time: 12/19/17 11:11 PM  Result Value Ref Range Status   MRSA by PCR NEGATIVE NEGATIVE Final    Comment:        The GeneXpert MRSA Assay (FDA approved for NASAL specimens only), is one component of a comprehensive MRSA colonization surveillance program. It is not intended to diagnose MRSA infection nor to guide or monitor treatment for MRSA infections. Performed at [facility], [address] [place], [place] [address]   CULTURE, BLOOD (ROUTINE X 2) w Reflex to ID Panel     Status: None   Collection Time: 12/21/17  5:57 AM  Result Value Ref Range Status   Specimen Description BLOOD RIGHT HAND  Final   Special Requests   Final    BOTTLES DRAWN AEROBIC AND ANAEROBIC Blood Culture adequate volume   Culture   Final    NO GROWTH 5 DAYS Performed at [facility], [address] [place], [place] [address]    Report Status 12/26/2017 FINAL  Final  CULTURE, BLOOD (ROUTINE X 2) w Reflex to ID Panel     Status: None   Collection Time: 12/21/17  5:57 AM  Result Value Ref Range Status   Specimen Description BLOOD LEFT ANTECUBITAL  Final   Special Requests   Final    BOTTLES DRAWN AEROBIC AND ANAEROBIC Blood Culture adequate volume   Culture   Final    NO GROWTH 5 DAYS Performed at [facility], [address], [place], [place] [address]    Report Status 12/26/2017 FINAL  Final  C difficile quick scan w PCR reflex     Status: None   Collection Time: 12/21/17 10:24 AM  Result Value Ref Range Status   C Diff antigen NEGATIVE NEGATIVE Final   C Diff toxin NEGATIVE NEGATIVE Final   C Diff interpretation No C. difficile detected.  Final    Comment: Performed at [facility], [address] [place],  [address]    RADIOLOGY:  No results  found.  EKG:   Orders placed or performed during the hospital encounter of 12/19/17  . ED EKG 12-Lead  . ED EKG 12-Lead  . EKG 12-Lead  . EKG 12-Lead  . EKG 12-Lead  . EKG 12-Lead      Management plans discussed with the patient, family and they are in agreement.  CODE STATUS:     Code Status Orders  (From admission, onward)        Start     Ordered   12/19/17 2244  Full code  Continuous  12/19/17 2243    Code Status History    Date Active Date Inactive Code Status Order ID Comments User Context   This patient has a current code status but no historical code status.    Advance Directive Documentation     Most Recent Value  Type of Advance Directive  Living will  Pre-existing out of facility DNR order (yellow form or pink MOST form)  No data  "MOST" Form in Place?  No data      TOTAL TIME TAKING CARE OF THIS PATIENT: 45 minutes.    Avel Peace Florence Antonelli M.D on 12/27/2017 at 11:20 AM  Between 7am to 6pm - Pager - [phone]  After 6pm go to [email or web address] - password [email or web address]  Office  [phone]  CC: Primary care physician; Glendon Axe, MD   Note: This dictation was prepared with Dragon dictation along with smaller phrase technology. Any transcriptional errors that result from this process are unintentional.

## 2017-12-27 NOTE — Progress Notes (Signed)
Pt discharged via non-emergent [facility] to [facility]. Report called to accepting RN. IV removed, port deaccessed,  pt on room air and in no distress. Ammie Dalton, RN

## 2017-12-27 NOTE — Plan of Care (Signed)
Patient refuses supplemental boost, encourage patient to eat small frequent snacks.

## 2017-12-29 NOTE — Progress Notes (Signed)
[place]  [phone] [phone]  ID: [name] OB: [birth year]  MR#: [id number]  CHY#:[id number]  Patient Care Team: Glendon Axe, MD as PCP - General (Internal Medicine) Leonie Green, MD as Referring Physician (Surgery)  CHIEF COMPLAINT: Progressive follicular lymphoma  INTERVAL HISTORY: Patient returns to clinic today for further evaluation and hospital follow-up.  She was recently admitted with neutropenic fever and also had complications with atrial fibrillation.  She was recently discharged and is now in a rehabilitation facility.  She continues to have increased weakness and fatigue, but this has improved since discharge. She has no neurologic complaints. She denies any fevers, night sweats, or weight loss. She has no chest pain. Her appetite is improved and she denies any nausea, vomiting, constipation, or diarrhea. She has no urinary complaints. Patient offers no further specific complaints today.  REVIEW OF SYSTEMS:   Review of Systems  Constitutional: Positive for malaise/fatigue. Negative for fever and weight loss.  Respiratory: Positive for cough and shortness of breath. Negative for hemoptysis.   Cardiovascular: Negative.  Negative for chest pain and leg swelling.  Gastrointestinal: Negative.  Negative for abdominal pain, blood in stool, melena, nausea and vomiting.  Genitourinary: Negative.   Musculoskeletal: Negative.  Negative for back pain and joint pain.  Skin: Negative.  Negative for rash.  Neurological: Positive for weakness. Negative for sensory change.  Psychiatric/Behavioral: Negative.  The patient is not nervous/anxious and does not have insomnia.    As per HPI. Otherwise, a complete review of systems is negative.   PAST MEDICAL HISTORY: Past Medical History:  Diagnosis Date  . Arthritis   . Cataract    bilateral  . Chicken pox   . Colon polyp   . Follicular lymphoma ([place]) 08/2016   lymph nodes     . Hyperlipidemia   . Osteoporosis     PAST SURGICAL HISTORY: Past Surgical History:  Procedure Laterality Date  . AXILLARY LYMPH NODE DISSECTION Right 08/21/2016   Procedure: AXILLARY LYMPH NODE excision;  Surgeon: Leonie Green, MD;  Location: [facility];  Service: General;  Laterality: Right;  . CATARACT EXTRACTION, BILATERAL Bilateral   . JOINT REPLACEMENT Left   . PORTA CATH INSERTION N/A 09/16/2017   Procedure: PORTA CATH INSERTION;  Surgeon: Algernon Huxley, MD;  Location: [place] [facility];  Service: Cardiovascular;  Laterality: N/A;  . TONSILLECTOMY    . TOTAL HIP ARTHROPLASTY Left 1992    FAMILY HISTORY: Family History  Problem Relation Age of Onset  . Diabetes Sister   . Lung cancer Brother   . Diabetes Brother   . Basal cell carcinoma Daughter   . Breast cancer Paternal Aunt     ADVANCED DIRECTIVES (Y/N):  N  HEALTH MAINTENANCE: Social History   Tobacco Use  . Smoking status: Never Smoker  . Smokeless tobacco: Never Used  Substance Use Topics  . Alcohol use: No  . Drug use: No     Colonoscopy:  PAP:  Bone density:  Lipid panel:  No Known Allergies  Current Outpatient Medications  Medication Sig Dispense Refill  . albuterol (PROVENTIL HFA;VENTOLIN HFA) 108 (90 Base) MCG/ACT inhaler Inhale 2 puffs into the lungs every 6 (six) hours as needed for wheezing or shortness of breath. 1 Inhaler 2  . cephALEXin (KEFLEX) 500 MG capsule Take 1 capsule (500 mg total) by mouth 4 (four) times daily for 14 days. 56 capsule 0  . digoxin 62.5 MCG TABS Take 0.125 mg by mouth daily.  30 tablet 0  . dronabinol (MARINOL) 5 MG capsule Take 1 capsule (5 mg total) by mouth 2 (two) times daily before lunch and supper. 20 capsule 0  . HYDROcodone-acetaminophen (NORCO/VICODIN) 5-325 MG tablet Take 2 tablets by mouth every 6 (six) hours as needed for moderate pain. 20 tablet 0  . lidocaine-prilocaine (EMLA) cream Apply 1 application as needed topically. 30 g 0  . liver  oil-zinc oxide (DESITIN) 40 % ointment Apply topically as needed for irritation. 56.7 g 0  . loperamide (IMODIUM) 2 MG capsule Take 2 mg by mouth as needed for diarrhea or loose stools.    . metoprolol tartrate 37.5 MG TABS Take 37.5 mg by mouth 2 (two) times daily. 60 tablet 0  . Multiple Vitamins-Minerals (CENTRUM SILVER PO) Take 1 tablet by mouth daily.    Marland Kitchen nystatin (MYCOSTATIN) 100000 UNIT/ML suspension Take 5 mLs (500,000 Units total) by mouth 4 (four) times daily. 60 mL 0  . ondansetron (ZOFRAN) 8 MG tablet Take 1 tablet (8 mg total) 2 (two) times daily as needed by mouth for refractory nausea / vomiting. 60 tablet 2  . pantoprazole (PROTONIX) 20 MG tablet Take 20 mg by mouth at bedtime.     . potassium chloride (KLOR-CON) 20 MEQ packet Take 20 mEq by mouth daily. 30 tablet 0  . prochlorperazine (COMPAZINE) 10 MG tablet Take 1 tablet (10 mg total) every 6 (six) hours as needed by mouth (Nausea or vomiting). 60 tablet 2  . simvastatin (ZOCOR) 20 MG tablet Take 20 mg by mouth at bedtime.     . megestrol (MEGACE) 40 MG tablet TAKE 1 TABLET BY MOUTH EVERY DAY 30 tablet 2   No current facility-administered medications for this visit.    Facility-Administered Medications Ordered in Other Visits  Medication Dose Route Frequency Provider Last Rate Last Dose  . heparin lock flush 100 unit/mL  500 Units Intravenous Once Lloyd Huger, MD      . sodium chloride flush (NS) 0.9 % injection 10 mL  10 mL Intravenous PRN Lloyd Huger, MD   10 mL at 12/04/17 0901  . yttrium-90 injection 15.1 millicurie  76.1 millicurie Intravenous Once Chrystal, Eulas Post, MD        OBJECTIVE: Vitals:   01/02/18 0838  BP: 117/79  Pulse: (!) 108  Resp: 20  Temp: 98.3 F (36.8 C)     Body mass index is 17.49 kg/m.    ECOG FS:2 - Symptomatic, <50% confined to bed  General: Thin, no acute distress.  Sitting in a wheelchair. Eyes: Pink conjunctiva, anicteric sclera. Breasts: Right breast without  lymphedema. Lungs: Clear to auscultation bilaterally. Heart: Regular rate and rhythm. No rubs, murmurs, or gallops. Abdomen: Soft, nontender, nondistended. No organomegaly noted, normoactive bowel sounds. Musculoskeletal: No edema, cyanosis, or clubbing. Neuro: Alert, answering all questions appropriately. Cranial nerves grossly intact. Skin: No rashes or petechiae noted. Psych: Normal affect. Lymphatics: Easily palpable lymphadenopathy in right axilla.  No palpable supraclavicular or cervical adenopathy.  LAB RESULTS:  Lab Results  Component Value Date   NA 136 01/02/2018   K 3.8 01/02/2018   CL 103 01/02/2018   CO2 22 01/02/2018   GLUCOSE 93 01/02/2018   BUN 10 01/02/2018   CREATININE 0.41 (L) 01/02/2018   CALCIUM 9.4 01/02/2018   PROT 6.1 (L) 01/02/2018   ALBUMIN 3.4 (L) 01/02/2018   AST 27 01/02/2018   ALT 16 01/02/2018   ALKPHOS 50 01/02/2018   BILITOT 0.4 01/02/2018   GFRNONAA >60 01/02/2018  GFRAA >60 01/02/2018    Lab Results  Component Value Date   WBC 3.0 (L) 01/02/2018   NEUTROABS 1.5 01/02/2018   HGB 9.9 (L) 01/02/2018   HCT 28.3 (L) 01/02/2018   MCV 96.2 01/02/2018   PLT 128 (L) 01/02/2018     STUDIES: Ct Soft Tissue Neck W Contrast  Result Date: 12/09/2017 CLINICAL DATA:  72 y/o F; restaging of follicular lymphoma diagnosed 10/17 currently receiving chemotherapy. EXAM: CT NECK WITH CONTRAST TECHNIQUE: Multidetector CT imaging of the neck was performed using the standard protocol following the bolus administration of intravenous contrast. CONTRAST:  1m ISOVUE-300 IOPAMIDOL (ISOVUE-300) INJECTION 61% COMPARISON:  Concurrent CT of chest abdomen and pelvis. 09/12/2017 CT of the neck. 04/19/2017, 11/07/2016, 08/01/2016 PET-CT. FINDINGS: Pharynx and larynx: Normal. No mass or swelling. Salivary glands: No inflammation, mass, or stone. Thyroid: Normal. Lymph nodes: No lymphadenopathy of the neck by imaging criteria. Partially visualized bulky mass in the right  axilla, correlation with concurrent CT of chest findings recommended. Vascular: Mild calcific atherosclerosis of aortic arch and left carotid bifurcation without significant stenosis. Limited intracranial: Negative. Visualized orbits: Negative. Mastoids and visualized paranasal sinuses: Clear. Skeleton: Moderate cervical spondylosis with discogenic degenerative changes greatest at the C4-C6 levels and C3-4 minimal grade 1 anterolisthesis. No acute osseous abnormality identified. Upper chest: Stable scarring in right lung apex. Other: None. IMPRESSION: 1. Decreased size of neck lymph nodes. No residual lymphadenopathy of the neck. 2. Partially visualize bulky mass in right axilla, correlation with concurrent CT of chest findings recommended. Electronically Signed   By: LKristine GarbeM.D.   On: 12/09/2017 15:53   Ct Chest W Contrast  Result Date: 12/09/2017 CLINICAL DATA:  Followup lymphoma. EXAM: CT CHEST, ABDOMEN, AND PELVIS WITH CONTRAST TECHNIQUE: Multidetector CT imaging of the chest, abdomen and pelvis was performed following the standard protocol during bolus administration of intravenous contrast. CONTRAST:  852mISOVUE-300 IOPAMIDOL (ISOVUE-300) INJECTION 61% COMPARISON:  09/12/2017 FINDINGS: CT CHEST FINDINGS Cardiovascular: Normal heart size. No pericardial effusion identified. Aortic atherosclerosis. Mediastinum/Nodes: Normal thyroid gland. The trachea appears patent and midline. Normal appearance of the esophagus. Previous enlarged right paratracheal node measures 0.5 cm, image 17 of series 4. Previously 1.8 cm. There has been resolution of previous anterior mediastinal prevascular adenopathy. The index left internal mammary lymph node measures 0.7 cm, image 21 of series 4. Previously 1.6 cm. Index left axillary node has resolved in the interval. Previously noted left breast lesions have resolved in the interval. The large round axillary mass persists measuring 4.1 x 4.3 x 5.4 cm (volume =  50 cm^3). Previously 4.4 x 3.6 x 5.1 cm (volume = 42 cm^3). Lungs/Pleura: There has been resolution of previous right pleural effusion. Resolution of previous right-sided paraspinal soft tissue overlying the right upper lobe. Previous pleural nodularity overlying the right lung has resolved in the interval. 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 No suspicious bone lesions. CT ABDOMEN PELVIS FINDINGS Hepatobiliary: No focal liver abnormality is seen. No gallstones, gallbladder wall thickening, or biliary dilatation. Pancreas: Unremarkable. No pancreatic ductal dilatation or surrounding inflammatory changes. Spleen: Normal in size without focal abnormality. Adrenals/Urinary Tract: The adrenal glands are normal. Unremarkable appearance of both kidneys. The urinary bladder appears unremarkable. Stomach/Bowel: The stomach is normal. The small bowel loops have a normal course and caliber without obstruction. Normal appearance of the colon. Vascular/Lymphatic: Aortic atherosclerosis without aneurysm. No adenopathy within the abdomen. No pelvic or inguinal adenopathy identified. Reproductive: The uterus is unremarkable.  No adnexal mass.  Other: No free fluid or fluid collections identified. No peritoneal nodularity. Musculoskeletal: Previous left hip arthroplasty. There is a lucent lesion involving the left acetabulum measuring 3.6 by 2.3 cm. 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 IMPRESSION: 1. Continued interval response to therapy. When compared with 09/12/2017 there has been resolution of mediastinal, left axillary and left internal mammary adenopathy. The lesions within the right breast have also resolved in the interval. Resolution of right-sided pleural nodularity. 2. There is a persistent enlarged soft tissue mass within the right axilla which is slightly increased in volume compared with the previous exam and worrisome for residual tumor. Electronically  Signed   By: Kerby Moors M.D.   On: 12/09/2017 16:58   Ct Abdomen Pelvis W Contrast  Result Date: 12/20/2017 CLINICAL DATA:  Decreased appetite.  Nausea. EXAM: CT ABDOMEN AND PELVIS WITH CONTRAST TECHNIQUE: Multidetector CT imaging of the abdomen and pelvis was performed using the standard protocol following bolus administration of intravenous contrast. CONTRAST:  66m ISOVUE-300 IOPAMIDOL (ISOVUE-300) INJECTION 61% COMPARISON:  December 09, 2017 FINDINGS: Lower chest: New pleural effusions are identified, right greater than left, relatively small. Associated atelectasis is noted. A pectus deformity is noted in the lower chest, unchanged. No nodules, masses, or suspicious infiltrates are identified in the lower chest. Hepatobiliary: The liver is normal in appearance. The gallbladder is more distended in the interval with no obvious stones or definitive wall thickening. There is a small amount of fluid adjacent to the gallbladder which is nonspecific given mild ascites elsewhere which will be described below. The portal vein is patent. Pancreas: Unremarkable. No pancreatic ductal dilatation or surrounding inflammatory changes. Spleen: Normal in size without focal abnormality. Adrenals/Urinary Tract: Adrenal glands are normal. The kidneys, ureters, and bladder are normal. Stomach/Bowel: The stomach appears thick walled but is poorly evaluated due to lack of distention. There was no wall thickening on the December 09, 2017 study. The small bowel is normal. There appears to be rectal wall thickening as well such as on series 2, image 68 and coronal image 52. No adjacent fat stranding identified. Scattered colonic diverticuli without diverticulitis. The remainder of the colon is otherwise normal. The appendix is normal in appearance and best seen on coronal imaging. Vascular/Lymphatic: Atherosclerotic changes is seen in the nonaneurysmal aorta. No adenopathy. Reproductive: Uterus and bilateral adnexa are  unremarkable. Other: Ascites in the abdomen adjacent to the liver, spleen, gallbladder, and in the pericolic gutters and pelvis. Musculoskeletal: No acute or significant osseous findings. IMPRESSION: 1. The stomach wall appears thickened. This could be due to poor distention but gastritis is not excluded. Recommend clinical correlation. 2. The rectal wall appears thickened as well, new in the interval, suggesting proctitis. Recommend clinical correlation. 3. New bilateral pleural effusions and ascites. 4. The gallbladder is mildly distended but there is no definitive cholelithiasis or wall thickening. The fluid adjacent to the gallbladder is likely due to the ascites. An ultrasound could better evaluate if warranted. 5. Electronically Signed   By: DDorise BullionIII M.D   On: 12/20/2017 20:07   Ct Abdomen Pelvis W Contrast  Result Date: 12/09/2017 CLINICAL DATA:  Followup lymphoma. EXAM: CT CHEST, ABDOMEN, AND PELVIS WITH CONTRAST TECHNIQUE: Multidetector CT imaging of the chest, abdomen and pelvis was performed following the standard protocol during bolus administration of intravenous contrast. CONTRAST:  813mISOVUE-300 IOPAMIDOL (ISOVUE-300) INJECTION 61% COMPARISON:  09/12/2017 FINDINGS: CT CHEST FINDINGS Cardiovascular: Normal heart size. No pericardial effusion identified. Aortic atherosclerosis. Mediastinum/Nodes: Normal thyroid gland.  The trachea appears patent and midline. Normal appearance of the esophagus. Previous enlarged right paratracheal node measures 0.5 cm, image 17 of series 4. Previously 1.8 cm. There has been resolution of previous anterior mediastinal prevascular adenopathy. The index left internal mammary lymph node measures 0.7 cm, image 21 of series 4. Previously 1.6 cm. Index left axillary node has resolved in the interval. Previously noted left breast lesions have resolved in the interval. The large round axillary mass persists measuring 4.1 x 4.3 x 5.4 cm (volume = 50 cm^3).  Previously 4.4 x 3.6 x 5.1 cm (volume = 42 cm^3). Lungs/Pleura: There has been resolution of previous right pleural effusion. Resolution of previous right-sided paraspinal soft tissue overlying the right upper lobe. Previous pleural nodularity overlying the right lung has resolved in the interval. 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 No suspicious bone lesions. CT ABDOMEN PELVIS FINDINGS Hepatobiliary: No focal liver abnormality is seen. No gallstones, gallbladder wall thickening, or biliary dilatation. Pancreas: Unremarkable. No pancreatic ductal dilatation or surrounding inflammatory changes. Spleen: Normal in size without focal abnormality. Adrenals/Urinary Tract: The adrenal glands are normal. Unremarkable appearance of both kidneys. The urinary bladder appears unremarkable. Stomach/Bowel: The stomach is normal. The small bowel loops have a normal course and caliber without obstruction. Normal appearance of the colon. Vascular/Lymphatic: Aortic atherosclerosis without aneurysm. No adenopathy within the abdomen. No pelvic or inguinal adenopathy identified. Reproductive: The uterus is unremarkable.  No adnexal mass. Other: No free fluid or fluid collections identified. No peritoneal nodularity. Musculoskeletal: Previous left hip arthroplasty. There is a lucent lesion involving the left acetabulum measuring 3.6 by 2.3 cm. 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 IMPRESSION: 1. Continued interval response to therapy. When compared with 09/12/2017 there has been resolution of mediastinal, left axillary and left internal mammary adenopathy. The lesions within the right breast have also resolved in the interval. Resolution of right-sided pleural nodularity. 2. There is a persistent enlarged soft tissue mass within the right axilla which is slightly increased in volume compared with the previous exam and worrisome for residual tumor. Electronically Signed    By: Kerby Moors M.D.   On: 12/09/2017 16:58   Dg Chest Port 1 View  Result Date: 12/19/2017 CLINICAL DATA:  Fever.  History of lymphoma. EXAM: PORTABLE CHEST 1 VIEW COMPARISON:  Chest CT 12/09/2017 and chest x-ray 11/18/2017 FINDINGS: The right IJ Port-A-Cath is stable. The heart is normal in size. Stable tortuosity and calcification of the thoracic aorta. The lungs are clear. No pulmonary lesions or pleural effusion. Stable hazy right upper lobe scarring type changes. The bony thorax is intact. IMPRESSION: No acute pulmonary findings. Stable right upper lobe scarring type changes. Electronically Signed   By: Marijo Sanes M.D.   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  PLAN:    1. Recurrent follicular lymphoma: CT scan results from September 12, 2017 reviewed independently revealing progressive disease despite receiving Zevalin on July 30, 2017. Pretreatment MUGA on September 18, 2017 revealed an EF of 54%.  Patient received cycle 5 of R CHOP chemotherapy with Neulasta support on December 12, 2017.  CT scans from December 09, 2017 reviewed independently and reported as above revealed near complete resolution of her disease.  Given her decreased performance status, will discontinue treatment at this time.  Patient will return to clinic in mid April with restaging PET scan and further evaluation.    2.  Weakness and fatigue: Multifactorial.  Continue rehab as indicated. 3. Shoulder pain: Resolved.  CT scan results as above.  Likely secondary to progressive disease.  Treatment as above. 4. Shortness of breath/cough: Improved. 5.  Right breast lymphedema: Resolved.  Likely secondary to tumor infiltration.  Treatment as above. 6.  Pancytopenia: Secondary to chemotherapy, which has now been discontinued.    Patient expressed understanding and was in agreement with this plan. She also understands that She can call clinic at any time with any  questions, concerns, or complaints.   Cancer Staging Follicular lymphoma ([place]) Staging form: Lymphoid Neoplasms, AJCC 6th Edition - Clinical stage from 08/27/2016: Stage IIE (lymphoma only) - Signed by Lloyd Huger, MD on 08/27/2016   Lloyd Huger, MD   01/04/2018 7:26 AM

## 2018-01-02 ENCOUNTER — Inpatient Hospital Stay: Payer: Medicare Other

## 2018-01-02 ENCOUNTER — Inpatient Hospital Stay (HOSPITAL_BASED_OUTPATIENT_CLINIC_OR_DEPARTMENT_OTHER): Payer: Medicare Other | Admitting: Oncology

## 2018-01-02 ENCOUNTER — Encounter: Payer: Self-pay | Admitting: Oncology

## 2018-01-02 VITALS — BP 117/79 | HR 108 | Temp 98.3°F | Resp 20 | Wt 86.6 lb

## 2018-01-02 DIAGNOSIS — R531 Weakness: Secondary | ICD-10-CM

## 2018-01-02 DIAGNOSIS — C8208 Follicular lymphoma grade I, lymph nodes of multiple sites: Secondary | ICD-10-CM | POA: Diagnosis present

## 2018-01-02 DIAGNOSIS — R5383 Other fatigue: Secondary | ICD-10-CM | POA: Diagnosis not present

## 2018-01-02 DIAGNOSIS — Z5111 Encounter for antineoplastic chemotherapy: Secondary | ICD-10-CM | POA: Diagnosis present

## 2018-01-02 DIAGNOSIS — R0602 Shortness of breath: Secondary | ICD-10-CM | POA: Diagnosis not present

## 2018-01-02 DIAGNOSIS — R739 Hyperglycemia, unspecified: Secondary | ICD-10-CM | POA: Diagnosis not present

## 2018-01-02 DIAGNOSIS — D61810 Antineoplastic chemotherapy induced pancytopenia: Secondary | ICD-10-CM

## 2018-01-02 DIAGNOSIS — R05 Cough: Secondary | ICD-10-CM

## 2018-01-02 DIAGNOSIS — I890 Lymphedema, not elsewhere classified: Secondary | ICD-10-CM | POA: Diagnosis not present

## 2018-01-02 DIAGNOSIS — Z5189 Encounter for other specified aftercare: Secondary | ICD-10-CM | POA: Diagnosis not present

## 2018-01-02 DIAGNOSIS — Z5112 Encounter for antineoplastic immunotherapy: Secondary | ICD-10-CM | POA: Diagnosis present

## 2018-01-02 DIAGNOSIS — D701 Agranulocytosis secondary to cancer chemotherapy: Secondary | ICD-10-CM | POA: Diagnosis not present

## 2018-01-02 LAB — CBC WITH DIFFERENTIAL/PLATELET
BASOS ABS: 0 10*3/uL (ref 0–0.1)
Basophils Relative: 0 %
Eosinophils Absolute: 0 10*3/uL (ref 0–0.7)
Eosinophils Relative: 0 %
HEMATOCRIT: 28.3 % — AB (ref 35.0–47.0)
HEMOGLOBIN: 9.9 g/dL — AB (ref 12.0–16.0)
LYMPHS PCT: 24 %
Lymphs Abs: 0.7 10*3/uL — ABNORMAL LOW (ref 1.0–3.6)
MCH: 33.6 pg (ref 26.0–34.0)
MCHC: 34.9 g/dL (ref 32.0–36.0)
MCV: 96.2 fL (ref 80.0–100.0)
Monocytes Absolute: 0.7 10*3/uL (ref 0.2–0.9)
Monocytes Relative: 24 %
NEUTROS ABS: 1.5 10*3/uL (ref 1.4–6.5)
NEUTROS PCT: 52 %
Platelets: 128 10*3/uL — ABNORMAL LOW (ref 150–440)
RBC: 2.95 MIL/uL — AB (ref 3.80–5.20)
RDW: 21 % — ABNORMAL HIGH (ref 11.5–14.5)
WBC: 3 10*3/uL — ABNORMAL LOW (ref 3.6–11.0)

## 2018-01-02 LAB — COMPREHENSIVE METABOLIC PANEL
ALT: 16 U/L (ref 14–54)
ANION GAP: 11 (ref 5–15)
AST: 27 U/L (ref 15–41)
Albumin: 3.4 g/dL — ABNORMAL LOW (ref 3.5–5.0)
Alkaline Phosphatase: 50 U/L (ref 38–126)
BUN: 10 mg/dL (ref 6–20)
CHLORIDE: 103 mmol/L (ref 101–111)
CO2: 22 mmol/L (ref 22–32)
Calcium: 9.4 mg/dL (ref 8.9–10.3)
Creatinine, Ser: 0.41 mg/dL — ABNORMAL LOW (ref 0.44–1.00)
Glucose, Bld: 93 mg/dL (ref 65–99)
POTASSIUM: 3.8 mmol/L (ref 3.5–5.1)
Sodium: 136 mmol/L (ref 135–145)
TOTAL PROTEIN: 6.1 g/dL — AB (ref 6.5–8.1)
Total Bilirubin: 0.4 mg/dL (ref 0.3–1.2)

## 2018-01-02 NOTE — Progress Notes (Signed)
Patient reports her appetite is improving and she is feeling stronger. Patient is currently at [facility] for rehab.

## 2018-01-02 NOTE — Progress Notes (Signed)
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  Patient receiving chemotherapy but treatment on hold today due to recent hospitalization.  Patient is currently at [facility].    Met with patient and daughter in exam room prior to MD visit.  Patient reports appetite is little bit better.  Reports some of the food at [facility] is good and other is not.  Wants to get stronger and get back home.  Reports she is working with PT daily.  Daughter reports that she is unsure if they are giving appetite stimulant or not at SNF.  Patient is not receiving supplement shakes.  Noted patient was drinking little bit of boost breeze in the hospital.    Medications: reviewed  Labs: reviewed  Anthropometrics:    Weight decreased to 86 lb 9.6 oz today from 96 lb 14.4 oz on 2/7 at last nutrition follow up.    NUTRITION DIAGNOSIS: Malnutrition continues   MALNUTRITION DIAGNOSIS: severe malnutrition continues   INTERVENTION:   Encouraged patient to ask SNF if they have clear oral nutrition supplement. Patient needs additional calories and protein to improve nutrition. Encouraged patient to consume good sources of protein and high calorie foods to help her reach her goal of returning home.   Continue appetite stimulant, daughter to confirm with SNF  MONITORING, EVALUATION, GOAL: weight trends, intake   NEXT VISIT: to be scheduled with treatment  Veronica Boyd B. Zenia Resides, [place], [place] Registered Dietitian [phone] (pager)

## 2018-01-03 ENCOUNTER — Other Ambulatory Visit: Payer: Self-pay | Admitting: Oncology

## 2018-01-14 ENCOUNTER — Telehealth: Payer: Self-pay | Admitting: *Deleted

## 2018-01-14 NOTE — Telephone Encounter (Signed)
Left message on [facility] voice mail to send orders over to be signed

## 2018-01-14 NOTE — Telephone Encounter (Signed)
If they fax the orders, I will sign them.

## 2018-01-14 NOTE — Telephone Encounter (Signed)
[facility] called asking for orders for Skilled Nursing visits for 60 days. Please advise

## 2018-02-17 NOTE — Progress Notes (Signed)
[facility]  [phone] [phone]  ID: [name] OB: [birth year]  MR#: [id number]  [facility]#:[id number]  Patient Care Team: Glendon Axe, MD as PCP - General (Internal Medicine) Leonie Green, MD as Referring Physician (Surgery)  CHIEF COMPLAINT: Progressive follicular lymphoma  INTERVAL HISTORY: Patient returns to clinic today for further evaluation and discussion of her imaging results.  Her performance status continues and to improve and she is gaining weight.  She has a good appetite.  She continues to have a mild weakness and fatigue.  She denies any pain. She has no neurologic complaints. She denies any fevers, night sweats, or weight loss. She has no chest pain. She denies any nausea, vomiting, constipation, or diarrhea. She has no urinary complaints.  Patient offers no specific complaints today.  REVIEW OF SYSTEMS:   Review of Systems  Constitutional: Positive for malaise/fatigue. Negative for fever and weight loss.  Respiratory: Negative.  Negative for cough, hemoptysis and shortness of breath.   Cardiovascular: Negative.  Negative for chest pain and leg swelling.  Gastrointestinal: Negative.  Negative for abdominal pain, blood in stool, melena, nausea and vomiting.  Genitourinary: Negative.  Negative for dysuria.  Musculoskeletal: Negative.  Negative for back pain and joint pain.  Skin: Negative.  Negative for rash.  Neurological: Positive for weakness. Negative for sensory change and focal weakness.  Psychiatric/Behavioral: Negative.  The patient is not nervous/anxious and does not have insomnia.    As per HPI. Otherwise, a complete review of systems is negative.   PAST MEDICAL HISTORY: Past Medical History:  Diagnosis Date  . Arthritis   . Cataract    bilateral  . Chicken pox   . Colon polyp   . Follicular lymphoma ([place]) 08/2016   lymph nodes   . Hyperlipidemia   . Osteoporosis     PAST SURGICAL HISTORY: Past  Surgical History:  Procedure Laterality Date  . AXILLARY LYMPH NODE DISSECTION Right 08/21/2016   Procedure: AXILLARY LYMPH NODE excision;  Surgeon: Leonie Green, MD;  Location: [facility];  Service: General;  Laterality: Right;  . CATARACT EXTRACTION, BILATERAL Bilateral   . JOINT REPLACEMENT Left   . PORTA CATH INSERTION N/A 09/16/2017   Procedure: PORTA CATH INSERTION;  Surgeon: Algernon Huxley, MD;  Location: [facility];  Service: Cardiovascular;  Laterality: N/A;  . TONSILLECTOMY    . TOTAL HIP ARTHROPLASTY Left 1992    FAMILY HISTORY: Family History  Problem Relation Age of Onset  . Diabetes Sister   . Lung cancer Brother   . Diabetes Brother   . Basal cell carcinoma Daughter   . Breast cancer Paternal Aunt     ADVANCED DIRECTIVES (Y/N):  N  HEALTH MAINTENANCE: Social History   Tobacco Use  . Smoking status: Never Smoker  . Smokeless tobacco: Never Used  Substance Use Topics  . Alcohol use: No  . Drug use: No     Colonoscopy:  PAP:  Bone density:  Lipid panel:  No Known Allergies  Current Outpatient Medications  Medication Sig Dispense Refill  . dronabinol (MARINOL) 5 MG capsule Take 1 capsule (5 mg total) by mouth 2 (two) times daily before lunch and supper. 20 capsule 0  . lidocaine-prilocaine (EMLA) cream Apply 1 application as needed topically. 30 g 0  . pantoprazole (PROTONIX) 20 MG tablet Take 20 mg by mouth at bedtime.     . simvastatin (ZOCOR) 20 MG tablet Take 20 mg by mouth at bedtime.     Marland Kitchen  albuterol (PROVENTIL HFA;VENTOLIN HFA) 108 (90 Base) MCG/ACT inhaler Inhale 2 puffs into the lungs every 6 (six) hours as needed for wheezing or shortness of breath. (Patient not taking: Reported on 02/20/2018) 1 Inhaler 2  . digoxin 62.5 MCG TABS Take 0.125 mg by mouth daily. (Patient not taking: Reported on 02/20/2018) 30 tablet 0  . HYDROcodone-acetaminophen (NORCO/VICODIN) 5-325 MG tablet Take 2 tablets by mouth every 6 (six) hours as needed for  moderate pain. (Patient not taking: Reported on 02/20/2018) 20 tablet 0  . liver oil-zinc oxide (DESITIN) 40 % ointment Apply topically as needed for irritation. (Patient not taking: Reported on 02/20/2018) 56.7 g 0  . loperamide (IMODIUM) 2 MG capsule Take 2 mg by mouth as needed for diarrhea or loose stools.    . megestrol (MEGACE) 40 MG tablet TAKE 1 TABLET BY MOUTH EVERY DAY (Patient not taking: Reported on 02/20/2018) 30 tablet 2  . metoprolol tartrate 37.5 MG TABS Take 37.5 mg by mouth 2 (two) times daily. (Patient not taking: Reported on 02/20/2018) 60 tablet 0  . Multiple Vitamins-Minerals (CENTRUM SILVER PO) Take 1 tablet by mouth daily.    Marland Kitchen nystatin (MYCOSTATIN) 100000 UNIT/ML suspension Take 5 mLs (500,000 Units total) by mouth 4 (four) times daily. (Patient not taking: Reported on 02/20/2018) 60 mL 0  . ondansetron (ZOFRAN) 8 MG tablet Take 1 tablet (8 mg total) 2 (two) times daily as needed by mouth for refractory nausea / vomiting. (Patient not taking: Reported on 02/20/2018) 60 tablet 2  . potassium chloride (KLOR-CON) 20 MEQ packet Take 20 mEq by mouth daily. (Patient not taking: Reported on 02/20/2018) 30 tablet 0  . prochlorperazine (COMPAZINE) 10 MG tablet Take 1 tablet (10 mg total) every 6 (six) hours as needed by mouth (Nausea or vomiting). (Patient not taking: Reported on 02/20/2018) 60 tablet 2   No current facility-administered medications for this visit.    Facility-Administered Medications Ordered in Other Visits  Medication Dose Route Frequency Provider Last Rate Last Dose  . heparin lock flush 100 unit/mL  500 Units Intravenous Once Lloyd Huger, MD      . heparin lock flush 100 unit/mL  500 Units Intracatheter PRN Lloyd Huger, MD      . sodium chloride flush (NS) 0.9 % injection 10 mL  10 mL Intravenous PRN Lloyd Huger, MD   10 mL at 12/04/17 0901  . sodium chloride flush (NS) 0.9 % injection 10 mL  10 mL Intracatheter PRN Lloyd Huger, MD        . yttrium-90 injection 27.2 millicurie  53.6 millicurie Intravenous Once [name], MD        OBJECTIVE: Vitals:   02/20/18 1027  BP: 116/70  Pulse: 94  Resp: 18  Temp: (!) 97.5 F (36.4 C)  SpO2: 98%     Body mass index is 19.25 kg/m.    ECOG FS:2 - Symptomatic, <50% confined to bed  General: Well-developed, well-nourished, no acute distress. Eyes: Pink conjunctiva, anicteric sclera. HEENT: Normocephalic, moist mucous membranes, clear oropharnyx. Breast: Right breast without lymphedema. Lungs: Clear to auscultation bilaterally. Heart: Regular rate and rhythm. No rubs, murmurs, or gallops. Abdomen: Soft, nontender, nondistended. No organomegaly noted, normoactive bowel sounds. Musculoskeletal: No edema, cyanosis, or clubbing. Neuro: Alert, answering all questions appropriately. Cranial nerves grossly intact. Skin: No rashes or petechiae noted. Psych: Normal affect. Lymphatics: Easily palpable lymphadenopathy in right axilla.  No palpable supraclavicular or cervical adenopathy.  LAB RESULTS:  Lab Results  Component Value  Date   NA 140 02/20/2018   K 3.5 02/20/2018   CL 107 02/20/2018   CO2 24 02/20/2018   GLUCOSE 106 (H) 02/20/2018   BUN 11 02/20/2018   CREATININE 0.66 02/20/2018   CALCIUM 9.8 02/20/2018   PROT 6.5 02/20/2018   ALBUMIN 4.1 02/20/2018   AST 27 02/20/2018   ALT 23 02/20/2018   ALKPHOS 64 02/20/2018   BILITOT 0.6 02/20/2018   GFRNONAA >60 02/20/2018   GFRAA >60 02/20/2018    Lab Results  Component Value Date   WBC 2.3 (L) 02/20/2018   NEUTROABS 1.0 (L) 02/20/2018   HGB 13.1 02/20/2018   HCT 38.0 02/20/2018   MCV 100.1 (H) 02/20/2018   PLT 130 (L) 02/20/2018     STUDIES: Nm Pet Image Restag (ps) Skull Base To Thigh  Result Date: 02/19/2018 CLINICAL DATA:  Subsequent treatment strategy for grade 1 follicular lymphoma. EXAM: NUCLEAR MEDICINE PET SKULL BASE TO THIGH TECHNIQUE: 5.99 mCi F-18 FDG was injected intravenously. Full-ring PET  imaging was performed from the skull base to thigh after the radiotracer. CT data was obtained and used for attenuation correction and anatomic localization. Fasting blood glucose: 90 mg/dl COMPARISON:  CT abdomen/pelvis dated 12/20/2017. CT chest abdomen pelvis dated 12/09/2017. FINDINGS: Mediastinal blood pool activity: SUV max 2.6 NECK: No hypermetabolic cervical lymphadenopathy. Incidental CT findings: none CHEST: Peripherally calcified 3.4 x 4.0 cm right axillary mass (series 3/image 62), chronic, non FDG avid (max SUV 0.8). This likely reflects sequela of postoperative seroma/hematoma. No hypermetabolic mediastinal lymphadenopathy. No suspicious pulmonary nodules. Incidental CT findings: Atherosclerotic calcifications of the aortic arch. Coronary atherosclerosis of the LAD. Right chest port terminates at the cavoatrial junction. ABDOMEN/PELVIS: No hypermetabolic lymphadenopathy in the abdomen/pelvis. No abnormal hypermetabolism of the liver, spleen, pancreas, or adrenal glands. Spleen is normal in size. Incidental CT findings: Atherosclerotic calcifications the abdominal aorta and branch vessels. SKELETON: Suspected healing fractures of the posteromedial right 10th and 11th ribs (series 3/image 100 and 108), with associated mild hypermetabolism, max SUV 2.6. No focal hypermetabolic activity to suggest skeletal metastasis. Incidental CT findings: Mild degenerative changes of the visualized thoracolumbar spine. Left hip arthroplasty. Associated lytic lesion along the posterior acetabulum (series 3/image 196), without hypermetabolism, favoring particle disease. IMPRESSION: 3.4 x 4.0 cm right axillary mass is chronic and non FDG avid, favoring sequela of postoperative seroma/hematoma. No findings suspicious for active lymphoma.  [place] 1. Healing fractures of the right posteromedial 10th and 11th ribs. Additional ancillary findings as above. Electronically Signed   By: Julian Hy M.D.   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  PLAN:    1. Recurrent follicular lymphoma: PET scan results from February 19, 2018 reviewed independently and reported as above with no suspicious finding for active lymphoma.  Patient received Zevalin on July 30, 2017 with not much therapeutic effect.  She subsequently underwent cycles 5 of R CHOP chemotherapy with Neulasta support completing on December 12, 2017.  No further intervention or treatments are needed at this time.  Return to clinic in 3 months with repeat we will repeat imaging with CT scan in 6 months in approximately October 2019.   2.  Weakness and fatigue: Improving.   3. Shoulder pain: Resolved. 4.  Leukopenia: Mild, monitor.  5.  Thrombocytopenia: Mild, monitor. 6.  Anemia: Resolved.  Patient expressed understanding and was in agreement with this plan. She also understands that She can call clinic at any time with any  questions, concerns, or complaints.   Cancer Staging Follicular lymphoma ([place]) Staging form: Lymphoid Neoplasms, AJCC 6th Edition - Clinical stage from 08/27/2016: Stage IIE (lymphoma only) - Signed by Lloyd Huger, MD on 08/27/2016   Lloyd Huger, MD   02/25/2018 3:22 PM

## 2018-02-19 ENCOUNTER — Ambulatory Visit
Admission: RE | Admit: 2018-02-19 | Discharge: 2018-02-19 | Disposition: A | Discharge status: Home / Self Care | Payer: Medicare Other | Source: Ambulatory Visit | Attending: Oncology | Admitting: Oncology

## 2018-02-19 DIAGNOSIS — S2241XD Multiple fractures of ribs, right side, subsequent encounter for fracture with routine healing: Secondary | ICD-10-CM | POA: Insufficient documentation

## 2018-02-19 DIAGNOSIS — C8208 Follicular lymphoma grade I, lymph nodes of multiple sites: Secondary | ICD-10-CM | POA: Diagnosis present

## 2018-02-19 DIAGNOSIS — X58XXXD Exposure to other specified factors, subsequent encounter: Secondary | ICD-10-CM | POA: Diagnosis not present

## 2018-02-19 LAB — GLUCOSE, CAPILLARY: GLUCOSE-CAPILLARY: 90 mg/dL (ref 65–99)

## 2018-02-19 MED ORDER — FLUDEOXYGLUCOSE F - 18 (FDG) INJECTION
5.2000 | Freq: Once | INTRAVENOUS | Status: AC | PRN
  Administered 2018-02-19: 5.99 via INTRAVENOUS

## 2018-02-20 ENCOUNTER — Encounter: Payer: Self-pay | Admitting: Oncology

## 2018-02-20 ENCOUNTER — Inpatient Hospital Stay: Payer: Medicare Other | Attending: Oncology | Admitting: Oncology

## 2018-02-20 ENCOUNTER — Inpatient Hospital Stay: Payer: Medicare Other

## 2018-02-20 VITALS — BP 116/70 | HR 94 | Temp 97.5°F | Resp 18 | Ht 59.0 in | Wt 95.3 lb

## 2018-02-20 DIAGNOSIS — C8208 Follicular lymphoma grade I, lymph nodes of multiple sites: Secondary | ICD-10-CM

## 2018-02-20 DIAGNOSIS — Z79899 Other long term (current) drug therapy: Secondary | ICD-10-CM | POA: Insufficient documentation

## 2018-02-20 DIAGNOSIS — D696 Thrombocytopenia, unspecified: Secondary | ICD-10-CM | POA: Diagnosis not present

## 2018-02-20 DIAGNOSIS — R5383 Other fatigue: Secondary | ICD-10-CM

## 2018-02-20 DIAGNOSIS — R531 Weakness: Secondary | ICD-10-CM

## 2018-02-20 DIAGNOSIS — Z95828 Presence of other vascular implants and grafts: Secondary | ICD-10-CM

## 2018-02-20 DIAGNOSIS — D72819 Decreased white blood cell count, unspecified: Secondary | ICD-10-CM

## 2018-02-20 DIAGNOSIS — C8290 Follicular lymphoma, unspecified, unspecified site: Secondary | ICD-10-CM | POA: Insufficient documentation

## 2018-02-20 LAB — CBC WITH DIFFERENTIAL/PLATELET
Basophils Absolute: 0 10*3/uL (ref 0–0.1)
Basophils Relative: 1 %
Eosinophils Absolute: 0 10*3/uL (ref 0–0.7)
Eosinophils Relative: 1 %
HCT: 38 % (ref 35.0–47.0)
Hemoglobin: 13.1 g/dL (ref 12.0–16.0)
Lymphocytes Relative: 37 %
Lymphs Abs: 0.8 10*3/uL — ABNORMAL LOW (ref 1.0–3.6)
MCH: 34.6 pg — ABNORMAL HIGH (ref 26.0–34.0)
MCHC: 34.6 g/dL (ref 32.0–36.0)
MCV: 100.1 fL — ABNORMAL HIGH (ref 80.0–100.0)
Monocytes Absolute: 0.4 10*3/uL (ref 0.2–0.9)
Monocytes Relative: 18 %
Neutro Abs: 1 10*3/uL — ABNORMAL LOW (ref 1.4–6.5)
Neutrophils Relative %: 43 %
Platelets: 130 10*3/uL — ABNORMAL LOW (ref 150–440)
RBC: 3.79 MIL/uL — ABNORMAL LOW (ref 3.80–5.20)
RDW: 14.2 % (ref 11.5–14.5)
WBC: 2.3 10*3/uL — ABNORMAL LOW (ref 3.6–11.0)

## 2018-02-20 LAB — COMPREHENSIVE METABOLIC PANEL WITH GFR
ALT: 23 U/L (ref 14–54)
AST: 27 U/L (ref 15–41)
Albumin: 4.1 g/dL (ref 3.5–5.0)
Alkaline Phosphatase: 64 U/L (ref 38–126)
Anion gap: 9 (ref 5–15)
BUN: 11 mg/dL (ref 6–20)
CO2: 24 mmol/L (ref 22–32)
Calcium: 9.8 mg/dL (ref 8.9–10.3)
Chloride: 107 mmol/L (ref 101–111)
Creatinine, Ser: 0.66 mg/dL (ref 0.44–1.00)
GFR calc Af Amer: >60 mL/min
GFR calc non Af Amer: >60 mL/min
Glucose, Bld: 106 mg/dL — ABNORMAL HIGH (ref 65–99)
Potassium: 3.5 mmol/L (ref 3.5–5.1)
Sodium: 140 mmol/L (ref 135–145)
Total Bilirubin: 0.6 mg/dL (ref 0.3–1.2)
Total Protein: 6.5 g/dL (ref 6.5–8.1)

## 2018-02-20 MED ORDER — HEPARIN SOD (PORK) LOCK FLUSH 100 UNIT/ML IV SOLN: 500.0000 [IU] | INTRAVENOUS | Status: AC | PRN

## 2018-02-20 MED ORDER — SODIUM CHLORIDE 0.9% FLUSH
10.0000 mL | INTRAVENOUS | Status: AC | PRN
  Filled 2018-02-20: qty 10

## 2018-02-20 NOTE — Progress Notes (Signed)
No new changes noted today 

## 2018-02-24 ENCOUNTER — Inpatient Hospital Stay: Payer: Medicare Other

## 2018-02-24 DIAGNOSIS — Z95828 Presence of other vascular implants and grafts: Secondary | ICD-10-CM

## 2018-02-24 DIAGNOSIS — C8290 Follicular lymphoma, unspecified, unspecified site: Secondary | ICD-10-CM | POA: Diagnosis not present

## 2018-02-24 MED ORDER — SODIUM CHLORIDE 0.9% FLUSH
10.0000 mL | INTRAVENOUS | Status: AC | PRN
  Administered 2018-02-24: 10 mL
  Filled 2018-02-24: qty 10

## 2018-02-24 MED ORDER — HEPARIN SOD (PORK) LOCK FLUSH 100 UNIT/ML IV SOLN
500.0000 [IU] | INTRAVENOUS | Status: AC | PRN
  Administered 2018-02-24: 500 [IU]

## 2018-04-07 ENCOUNTER — Ambulatory Visit: Payer: Medicare Other | Admitting: Oncology

## 2018-04-09 ENCOUNTER — Other Ambulatory Visit: Payer: Self-pay | Admitting: Physician Assistant

## 2018-04-09 ENCOUNTER — Telehealth: Payer: Self-pay | Admitting: *Deleted

## 2018-04-09 ENCOUNTER — Ambulatory Visit
Admission: RE | Admit: 2018-04-09 | Discharge: 2018-04-09 | Disposition: A | Discharge status: Home / Self Care | Payer: Medicare Other | Source: Ambulatory Visit | Attending: Physician Assistant | Admitting: Physician Assistant

## 2018-04-09 DIAGNOSIS — R270 Ataxia, unspecified: Secondary | ICD-10-CM

## 2018-04-09 DIAGNOSIS — G9389 Other specified disorders of brain: Secondary | ICD-10-CM | POA: Insufficient documentation

## 2018-04-09 DIAGNOSIS — R519 Headache, unspecified: Secondary | ICD-10-CM

## 2018-04-09 DIAGNOSIS — R51 Headache: Secondary | ICD-10-CM

## 2018-04-09 DIAGNOSIS — R42 Dizziness and giddiness: Secondary | ICD-10-CM

## 2018-04-09 NOTE — Telephone Encounter (Signed)
Veronica Boyd called and reports that patient is having light headedness and is unsteady when she gets up. Asking if she needs to call us or her PCP. She got her last RCHOP end of February 2019. Please advise

## 2018-04-09 NOTE — Telephone Encounter (Signed)
Advised Robin to please call Phone call after discussing with Lorretta Harp, NP. She agreed to this

## 2018-04-14 ENCOUNTER — Inpatient Hospital Stay: Payer: Medicare Other | Attending: Oncology | Admitting: Oncology

## 2018-04-14 ENCOUNTER — Inpatient Hospital Stay: Payer: Medicare Other

## 2018-04-14 VITALS — BP 110/78 | HR 105 | Wt 97.2 lb

## 2018-04-14 DIAGNOSIS — R5382 Chronic fatigue, unspecified: Secondary | ICD-10-CM | POA: Insufficient documentation

## 2018-04-14 DIAGNOSIS — C8208 Follicular lymphoma grade I, lymph nodes of multiple sites: Secondary | ICD-10-CM | POA: Insufficient documentation

## 2018-04-14 DIAGNOSIS — Z95828 Presence of other vascular implants and grafts: Secondary | ICD-10-CM

## 2018-04-14 MED ORDER — HEPARIN SOD (PORK) LOCK FLUSH 100 UNIT/ML IV SOLN
500.0000 [IU] | Freq: Once | INTRAVENOUS | Status: AC
  Administered 2018-04-14: 500 [IU] via INTRAVENOUS

## 2018-04-14 MED ORDER — SODIUM CHLORIDE 0.9% FLUSH
10.0000 mL | Freq: Once | INTRAVENOUS | Status: AC
  Administered 2018-04-14: 10 mL via INTRAVENOUS
  Filled 2018-04-14: qty 10

## 2018-04-14 NOTE — Progress Notes (Signed)
Survivorship Care Plan visit completed.  Treatment summary reviewed and given to patient.  ASCO answers booklet reviewed and given to patient.  CARE program and Cancer Transitions discussed with patient along with other resources cancer center offers to patients and caregivers.  Patient verbalized understanding.    

## 2018-04-14 NOTE — Progress Notes (Signed)
[place] [facility]  [phone] [phone]  Patient Care Team: Glendon Axe, MD as PCP - General (Internal Medicine) Lloyd Huger, MD as Consulting Physician (Oncology) Leonie Green, MD as Referring Physician (Surgery) [name], MD as Referring Physician (Radiation Oncology) Jacquelin Hawking, NP as Nurse Practitioner (Oncology)   Name of the patient: Veronica Boyd  [id number]  01/21/1946   Date of visit: 04/14/18  CLINIC: Survivorship  REASON FOR VISIT:  Parchment visit & follow-up post-treatment for a recent history of grade 1 follicular lymphoma  BRIEF ONCOLOGIC HISTORY:  Oncology History   Initial diagnosis 2014. S/p radiation 3 years ago.  Noted increase lymphadenopathy and biopsy was recommended.  Unfortunately she refused and was lost to follow-up. Return to clinic in September 2017 with increased swelling surrounding her right collarbone.  Had biopsy that was suggestive of follicular lymphoma but not definitive.  Had PET scan to complete the staging work-up.  PET scan showed hypermetabolic adenopathy in bilateral subpectoral and axillary regions, right internal mammary chain and right paratracheal and subclavicular regions.  There is no evidence of hypermetabolic lymphadenopathy within the abdomen or pelvis.  Received Zevalin on July 30, 2017 with not much therapeutic effect.  She subsequently underwent cycles 5 of R CHOP chemotherapy with Neulasta support completing on December 12, 2017.  No further intervention or treatments are needed at this time.  PET scan results from February 19, 2018 reviewed independently and reported as above with no suspicious finding for active lymphoma.        Follicular lymphoma ([place])   07/24/2016 Initial Diagnosis    Follicular lymphoma ([place])       INTERVAL HISTORY:  Veronica Boyd presents to [facility] today for our  initial meeting to review the survivorship care plan detailing her treatment course for grade 1 follicular lymphoma cancer, as well as monitoring long-term side effects of that treatment, education regarding health maintenance, screening, and overall wellness and health promotion.     Overall, Veronica Boyd reports feeling  quite well since completing her 5 cycles of R-CHOP therapy.  Completed in February 2019.  ADDITIONAL REVIEW OF SYSTEMS:  Review of Systems  Constitutional: Positive for malaise/fatigue (Improving). Negative for chills, fever and weight loss.  HENT: Negative for congestion and ear pain.   Eyes: Negative.  Negative for blurred vision and double vision.  Respiratory: Negative.  Negative for cough, sputum production and shortness of breath.   Cardiovascular: Negative.  Negative for chest pain, palpitations and leg swelling.  Gastrointestinal: Negative.  Negative for abdominal pain, constipation, diarrhea, nausea and vomiting.  Genitourinary: Negative for dysuria, frequency and urgency.  Musculoskeletal: Negative for back pain and falls.  Skin: Negative.  Negative for rash.  Neurological: Negative.  Negative for weakness and headaches.  Endo/Heme/Allergies: Negative.  Does not bruise/bleed easily.  Psychiatric/Behavioral: Negative.  Negative for depression. The patient is not nervous/anxious and does not have insomnia.      PAST MEDICAL/SURGICAL HISTORY:  Past Medical History:  Diagnosis Date  . Arthritis   . Cataract    bilateral  . Chicken pox   . Colon polyp   . Follicular lymphoma ([place]) 08/2016   lymph nodes   . Hyperlipidemia   . Osteoporosis    Past Surgical History:  Procedure Laterality Date  . AXILLARY LYMPH NODE DISSECTION Right 08/21/2016   Procedure: AXILLARY LYMPH NODE excision;  Surgeon: Leonie Green, MD;  Location: [facility] ORS;  Service: General;  Laterality: Right;  . CATARACT EXTRACTION, BILATERAL Bilateral   . JOINT REPLACEMENT Left   .  PORTA CATH INSERTION N/A 09/16/2017   Procedure: PORTA CATH INSERTION;  Surgeon: Algernon Huxley, MD;  Location: [place] [facility];  Service: Cardiovascular;  Laterality: N/A;  . TONSILLECTOMY    . TOTAL HIP ARTHROPLASTY Left 1992     ALLERGIES:  No Known Allergies    CURRENT MEDICATIONS:  Current Outpatient Medications on File Prior to Visit  Medication Sig Dispense Refill  . lidocaine-prilocaine (EMLA) cream Apply 1 application as needed topically. 30 g 0  . Multiple Vitamins-Minerals (CENTRUM SILVER PO) Take 1 tablet by mouth daily.    . pantoprazole (PROTONIX) 20 MG tablet Take 20 mg by mouth at bedtime.     . simvastatin (ZOCOR) 20 MG tablet Take 20 mg by mouth at bedtime.     Marland Kitchen albuterol (PROVENTIL HFA;VENTOLIN HFA) 108 (90 Base) MCG/ACT inhaler Inhale 2 puffs into the lungs every 6 (six) hours as needed for wheezing or shortness of breath. (Patient not taking: Reported on 02/20/2018) 1 Inhaler 2  . digoxin 62.5 MCG TABS Take 0.125 mg by mouth daily. (Patient not taking: Reported on 02/20/2018) 30 tablet 0  . dronabinol (MARINOL) 5 MG capsule Take 1 capsule (5 mg total) by mouth 2 (two) times daily before lunch and supper. (Patient not taking: Reported on 04/14/2018) 20 capsule 0  . HYDROcodone-acetaminophen (NORCO/VICODIN) 5-325 MG tablet Take 2 tablets by mouth every 6 (six) hours as needed for moderate pain. (Patient not taking: Reported on 02/20/2018) 20 tablet 0  . liver oil-zinc oxide (DESITIN) 40 % ointment Apply topically as needed for irritation. (Patient not taking: Reported on 02/20/2018) 56.7 g 0  . loperamide (IMODIUM) 2 MG capsule Take 2 mg by mouth as needed for diarrhea or loose stools.    . megestrol (MEGACE) 40 MG tablet TAKE 1 TABLET BY MOUTH EVERY DAY (Patient not taking: Reported on 02/20/2018) 30 tablet 2  . metoprolol tartrate 37.5 MG TABS Take 37.5 mg by mouth 2 (two) times daily. (Patient not taking: Reported on 02/20/2018) 60 tablet 0  . nystatin (MYCOSTATIN) 100000  UNIT/ML suspension Take 5 mLs (500,000 Units total) by mouth 4 (four) times daily. (Patient not taking: Reported on 02/20/2018) 60 mL 0  . ondansetron (ZOFRAN) 8 MG tablet Take 1 tablet (8 mg total) 2 (two) times daily as needed by mouth for refractory nausea / vomiting. (Patient not taking: Reported on 02/20/2018) 60 tablet 2  . potassium chloride (KLOR-CON) 20 MEQ packet Take 20 mEq by mouth daily. (Patient not taking: Reported on 02/20/2018) 30 tablet 0  . prochlorperazine (COMPAZINE) 10 MG tablet Take 1 tablet (10 mg total) every 6 (six) hours as needed by mouth (Nausea or vomiting). (Patient not taking: Reported on 02/20/2018) 60 tablet 2   Current Facility-Administered Medications on File Prior to Visit  Medication Dose Route Frequency Provider Last Rate Last Dose  . heparin lock flush 100 unit/mL  500 Units Intravenous Once Lloyd Huger, MD      . heparin lock flush 100 unit/mL  500 Units Intracatheter PRN Lloyd Huger, MD      . sodium chloride flush (NS) 0.9 % injection 10 mL  10 mL Intravenous PRN Lloyd Huger, MD   10 mL at 12/04/17 0901  . sodium chloride flush (NS) 0.9 % injection 10 mL  10 mL Intracatheter PRN Lloyd Huger, MD      . yttrium-90 injection 22.3  millicurie  17.6 millicurie Intravenous Once [name], MD           ONCOLOGIC FAMILY HISTORY:  Family History  Problem Relation Age of Onset  . Diabetes Sister   . Lung cancer Brother   . Diabetes Brother   . Basal cell carcinoma Daughter   . Breast cancer Paternal Aunt       SOCIAL HISTORY:  Veronica Boyd is single and lives alone in [place], [place].  she has 2 children and they live in [place].  Veronica Boyd  is currently retired.  she denies any current use of tobacco, alcohol, or illicit drug use.    PHYSICAL EXAMINATION:  Vital Signs: Vitals:   04/14/18 1125  BP: (!) 88/59  Pulse: (!) 103    Physical Exam  Constitutional: She is oriented to person, place, and  time. Vital signs are normal. She appears well-developed and well-nourished.  Appetite good.  Weight up 2 pounds.  HENT:  Head: Normocephalic and atraumatic.  Eyes: Pupils are equal, round, and reactive to light.  Neck: Normal range of motion.  Cardiovascular: Normal rate, regular rhythm and normal heart sounds.  No murmur heard. Pulmonary/Chest: Effort normal and breath sounds normal. She has no wheezes.  Abdominal: Soft. Normal appearance and bowel sounds are normal. She exhibits no distension. There is no tenderness.  Musculoskeletal: Normal range of motion. She exhibits no edema.  Neurological: She is alert and oriented to person, place, and time.  Skin: Skin is warm and dry. No rash noted.  Psychiatric: Judgment normal.    LABORATORY DATA:  Labs:  Lab Results  Component Value Date   WBC 2.3 (L) 02/20/2018   HGB 13.1 02/20/2018   HCT 38.0 02/20/2018   MCV 100.1 (H) 02/20/2018   PLT 130 (L) 02/20/2018   CMP Latest Ref Rng & Units 02/20/2018 01/02/2018 12/26/2017  Glucose 65 - 99 mg/dL 106(H) 93 93  BUN 6 - 20 mg/dL 11 10 6   Creatinine 0.44 - 1.00 mg/dL 0.66 0.41(L) 0.48  Sodium 135 - 145 mmol/L 140 136 136  Potassium 3.5 - 5.1 mmol/L 3.5 3.8 3.7  Chloride 101 - 111 mmol/L 107 103 101  CO2 22 - 32 mmol/L 24 22 27   Calcium 8.9 - 10.3 mg/dL 9.8 9.4 8.6(L)  Total Protein 6.5 - 8.1 g/dL 6.5 6.1(L) -  Total Bilirubin 0.3 - 1.2 mg/dL 0.6 0.4 -  Alkaline Phos 38 - 126 U/L 64 50 -  AST 15 - 41 U/L 27 27 -  ALT 14 - 54 U/L 23 16 -    DIAGNOSTIC IMAGING:  02/19/18 PET Re-staging  IMPRESSION: 3.4 x 4.0 cm right axillary mass is chronic and non FDG avid, favoring sequela of postoperative seroma/hematoma.  No findings suspicious for active lymphoma.  [place] 1.  Healing fractures of the right posteromedial 10th and 11th ribs.  Additional ancillary findings as above.  ASSESSMENT AND PLAN:  Veronica Boyd is a pleasant 72 y.o. female with history of grade 1 follicular  lymphoma, treated with 5 cycles of R-CHOP; completed treatment on 12/12/2017.  Patient presents to survivorship clinic today for routine follow-up after finishing treatment.   1.  Grade 1 follicular lymphoma:  Veronica Boyd is continuing to recover from the effects of cancer treatment.  Per surveillance protocol, she will follow-up with her primary medical oncologist Dr. Grayland Ormond with physical exam, labs and repeat imaging in July 2019.  Today, a comprehensive survivorship care plan and treatment summary was reviewed with the patient today  detailing her cancer diagnosis, treatment course, potential late/long-term effects of treatment, appropriate follow-up care with recommendations for the future, and patient education resources.  A copy of this summary, along with a letter will be sent to the patient's primary care provider via mail/fax/In Basket message after today's visit.  Veronica Boyd is welcome to return to the Survivorship Clinic in the future, as needed.    #. Problem(s) at Visit: Chronic fatigue that is improving  #. Nutritional status: Veronica Boyd reports that she is currently able to consume adequate nutrition by mouth.  her weight is currently stable at 97lbs. she was encouraged to continue to consume adequate hydration and nutrition, as tolerated.   #.  Lung cancer screening:  [employer] now offers eligible patients lung cancer screening with a low-dose chest CT to aid in early detection, provide more effective treatment options, and ultimately improve survival benefits for patients diagnosed early.  Below is the selection criteria for screening:  . Medicare patients: 55-77 years; privately insured patients 55-80 years. . Active or former smokers who have quit within the last 15 years. . 30+ pack-year history of smoking  . Exclusion criteria - No signs/symptoms of lung cancer (i.e., no recent history of hemoptysis and no unexplained weight loss >15 pounds in the last 6 months). . Willing and  healthy enough to undergo biopsies/surgery if needed.  Veronica Boyd currently does not meet criteria for lung cancer screening.  Therefore, I have not placed a referral for screening today.    #. Health maintenance and wellness promotion: Cancer patients who consume a diet rich in fruits and vegetables have better overall health and decreased risk of cancer recurrence. Veronica Boyd was encouraged to consume 5-7 servings of fruits and vegetables per day, as tolerated. We reviewed the "Nutrition Rainbow" handout. she was also encouraged to engage in moderate to vigorous exercise for 30 minutes per day most days of the week. We discussed the [employer] fitness program, which is designed for cancer survivors to help them become more physically fit after cancer treatments  #. Support services/counseling: It is not uncommon for this period of the patient's cancer care trajectory to be one of many emotions and stressors.  We discussed an opportunity for her to participate in the next session of [facility] ("Finding Your New Normal") support group series, designed for patients after they have completed treatment.   Veronica Boyd was encouraged to take advantage of our many other support services programs, support groups, and/or counseling in coping with her new life as a cancer survivor after completing anti-cancer treatment.  she was offered support today through active listening and expressive supportive counseling.  she was given information regarding our available services and encouraged to contact me with any questions or for help enrolling in any of our support group/programs.     Dispo:  -RTC for follow-up with Dr. Grayland Ormond 05/21/18 with labs.  -Consider referring the patient back to survivorship for long-term surveillance, when clinically appropriate.     A total of (25) minutes of face-to-face time was spent with this patient with greater than 50% of that time in counseling and  care-coordination.   Faythe Casa, AGNP-C [place] at [facility] [phone] (work cell) [phone] (office) 04/14/18 1:19 PM   Note: PRIMARY CARE PROVIDER Glendon Axe, MD [phone] Fax: [phone]

## 2018-05-18 NOTE — Progress Notes (Signed)
[place]  [phone] Fax:([phone]  ID: Veronica Boyd OB: [birth year]  MR#: [id number]  KVQ#:[id number]  Patient Care Team: Glendon Axe, MD as PCP - General (Internal Medicine) Lloyd Huger, MD as Consulting Physician (Oncology) Leonie Green, MD as Referring Physician (Surgery) [name], MD as Referring Physician (Radiation Oncology) Jacquelin Hawking, NP as Nurse Practitioner (Oncology)  CHIEF COMPLAINT: Progressive follicular lymphoma  INTERVAL HISTORY: Patient returns to clinic today for repeat laboratory work and further evaluation.  She currently feels well and is asymptomatic.  She has no neurologic complaints.  She has a good appetite and continues to gain weight.  She does not complain of weakness or fatigue today.  She denies any pain. She denies any fevers, night sweats, or weight loss.  She denies any chest pain, shortness of breath, or cough.  She denies any nausea, vomiting, constipation, or diarrhea. She has no urinary complaints.  Patient feels at her baseline offers no specific complaints today.  REVIEW OF SYSTEMS:   Review of Systems  Constitutional: Negative.  Negative for fever, malaise/fatigue and weight loss.  Respiratory: Negative.  Negative for cough, hemoptysis and shortness of breath.   Cardiovascular: Negative.  Negative for chest pain and leg swelling.  Gastrointestinal: Negative.  Negative for abdominal pain, blood in stool, melena, nausea and vomiting.  Genitourinary: Negative.  Negative for dysuria.  Musculoskeletal: Negative.  Negative for back pain and joint pain.  Skin: Negative.  Negative for rash.  Neurological: Negative.  Negative for sensory change, focal weakness, weakness and headaches.  Psychiatric/Behavioral: Negative.  The patient is not nervous/anxious and does not have insomnia.    As per HPI. Otherwise, a complete review of systems is negative.   PAST MEDICAL HISTORY: Past  Medical History:  Diagnosis Date  . Arthritis   . Cataract    bilateral  . Chicken pox   . Colon polyp   . Follicular lymphoma ([place]) 08/2016   lymph nodes   . Hyperlipidemia   . Osteoporosis     PAST SURGICAL HISTORY: Past Surgical History:  Procedure Laterality Date  . AXILLARY LYMPH NODE DISSECTION Right 08/21/2016   Procedure: AXILLARY LYMPH NODE excision;  Surgeon: Leonie Green, MD;  Location: [facility] ORS;  Service: General;  Laterality: Right;  . CATARACT EXTRACTION, BILATERAL Bilateral   . JOINT REPLACEMENT Left   . PORTA CATH INSERTION N/A 09/16/2017   Procedure: PORTA CATH INSERTION;  Surgeon: Algernon Huxley, MD;  Location: [place] [facility];  Service: Cardiovascular;  Laterality: N/A;  . TONSILLECTOMY    . TOTAL HIP ARTHROPLASTY Left 1992    FAMILY HISTORY: Family History  Problem Relation Age of Onset  . Diabetes Sister   . Lung cancer Brother   . Diabetes Brother   . Basal cell carcinoma Daughter   . Breast cancer Paternal Aunt     ADVANCED DIRECTIVES (Y/N):  N  HEALTH MAINTENANCE: Social History   Tobacco Use  . Smoking status: Never Smoker  . Smokeless tobacco: Never Used  Substance Use Topics  . Alcohol use: No  . Drug use: No     Colonoscopy:  PAP:  Bone density:  Lipid panel:  No Known Allergies  Current Outpatient Medications  Medication Sig Dispense Refill  . lidocaine-prilocaine (EMLA) cream Apply 1 application topically as needed. 30 g 1  . Multiple Vitamins-Minerals (CENTRUM SILVER PO) Take 1 tablet by mouth daily.    . pantoprazole (PROTONIX) 20 MG tablet Take 20 mg  by mouth at bedtime.     . simvastatin (ZOCOR) 20 MG tablet Take 20 mg by mouth at bedtime.     Marland Kitchen albuterol (PROVENTIL HFA;VENTOLIN HFA) 108 (90 Base) MCG/ACT inhaler Inhale 2 puffs into the lungs every 6 (six) hours as needed for wheezing or shortness of breath. (Patient not taking: Reported on 02/20/2018) 1 Inhaler 2  . digoxin 62.5 MCG TABS Take 0.125 mg by mouth  daily. (Patient not taking: Reported on 02/20/2018) 30 tablet 0  . dronabinol (MARINOL) 5 MG capsule Take 1 capsule (5 mg total) by mouth 2 (two) times daily before lunch and supper. (Patient not taking: Reported on 04/14/2018) 20 capsule 0  . HYDROcodone-acetaminophen (NORCO/VICODIN) 5-325 MG tablet Take 2 tablets by mouth every 6 (six) hours as needed for moderate pain. (Patient not taking: Reported on 02/20/2018) 20 tablet 0  . liver oil-zinc oxide (DESITIN) 40 % ointment Apply topically as needed for irritation. (Patient not taking: Reported on 02/20/2018) 56.7 g 0  . loperamide (IMODIUM) 2 MG capsule Take 2 mg by mouth as needed for diarrhea or loose stools.    . megestrol (MEGACE) 40 MG tablet TAKE 1 TABLET BY MOUTH EVERY DAY (Patient not taking: Reported on 02/20/2018) 30 tablet 2  . metoprolol tartrate 37.5 MG TABS Take 37.5 mg by mouth 2 (two) times daily. (Patient not taking: Reported on 02/20/2018) 60 tablet 0  . nystatin (MYCOSTATIN) 100000 UNIT/ML suspension Take 5 mLs (500,000 Units total) by mouth 4 (four) times daily. (Patient not taking: Reported on 02/20/2018) 60 mL 0  . ondansetron (ZOFRAN) 8 MG tablet Take 1 tablet (8 mg total) 2 (two) times daily as needed by mouth for refractory nausea / vomiting. (Patient not taking: Reported on 02/20/2018) 60 tablet 2  . potassium chloride (KLOR-CON) 20 MEQ packet Take 20 mEq by mouth daily. (Patient not taking: Reported on 02/20/2018) 30 tablet 0  . prochlorperazine (COMPAZINE) 10 MG tablet Take 1 tablet (10 mg total) every 6 (six) hours as needed by mouth (Nausea or vomiting). (Patient not taking: Reported on 02/20/2018) 60 tablet 2   No current facility-administered medications for this visit.    Facility-Administered Medications Ordered in Other Visits  Medication Dose Route Frequency Provider Last Rate Last Dose  . heparin lock flush 100 unit/mL  500 Units Intravenous Once Lloyd Huger, MD      . heparin lock flush 100 unit/mL  500 Units  Intracatheter PRN Lloyd Huger, MD      . sodium chloride flush (NS) 0.9 % injection 10 mL  10 mL Intravenous PRN Lloyd Huger, MD   10 mL at 12/04/17 0901  . sodium chloride flush (NS) 0.9 % injection 10 mL  10 mL Intracatheter PRN Lloyd Huger, MD      . yttrium-90 injection 83.4 millicurie  19.6 millicurie Intravenous Once [name], MD        OBJECTIVE: Vitals:   05/21/18 1125  BP: 113/74  Pulse: 94  Resp: 18  Temp: (!) 97.4 F (36.3 C)     Body mass index is 20.26 kg/m.    ECOG FS:2 - Symptomatic, <50% confined to bed  General: Well-developed, well-nourished, no acute distress. Eyes: Pink conjunctiva, anicteric sclera. HEENT: Normocephalic, moist mucous membranes, clear oropharnyx. Breast: Right breast without lymphedema. Lungs: Clear to auscultation bilaterally. Heart: Regular rate and rhythm. No rubs, murmurs, or gallops. Abdomen: Soft, nontender, nondistended. No organomegaly noted, normoactive bowel sounds. Musculoskeletal: No edema, cyanosis, or clubbing. Neuro: Alert, answering  all questions appropriately. Cranial nerves grossly intact. Skin: No rashes or petechiae noted. Psych: Normal affect. Lymphatics: PET negative palpable lymph node in right axilla decreased in size.  LAB RESULTS:  Lab Results  Component Value Date   NA 139 05/21/2018   K 4.1 05/21/2018   CL 107 05/21/2018   CO2 23 05/21/2018   GLUCOSE 104 (H) 05/21/2018   BUN 11 05/21/2018   CREATININE 0.67 05/21/2018   CALCIUM 9.6 05/21/2018   PROT 6.6 05/21/2018   ALBUMIN 4.2 05/21/2018   AST 30 05/21/2018   ALT 23 05/21/2018   ALKPHOS 84 05/21/2018   BILITOT 0.7 05/21/2018   GFRNONAA >60 05/21/2018   GFRAA >60 05/21/2018    Lab Results  Component Value Date   WBC 3.2 (L) 05/21/2018   NEUTROABS 1.6 05/21/2018   HGB 13.6 05/21/2018   HCT 39.3 05/21/2018   MCV 96.4 05/21/2018   PLT 164 05/21/2018     STUDIES: No results found.   ASSESSMENT: Progressive  follicular lymphoma, grade 1-2, Ki-67 75%.  PLAN:    1. Recurrent follicular lymphoma: PET scan results from February 19, 2018 reviewed independently with no suspicious finding for active lymphoma.  Patient received Zevalin on July 30, 2017 with not much therapeutic effect.  She subsequently underwent cycles 5 of R CHOP chemotherapy with Neulasta support completing on December 12, 2017.  No further intervention or treatments are needed at this time.  Patient did not wish to pursue maintenance Rituxan.  Return to clinic in 3 months with repeat imaging and further evaluation.   2.  Weakness and fatigue: Resolved. 3.  Shoulder pain: Resolved. 4.  Leukopenia: Improved. 5.  Thrombocytopenia: Resolved.  Patient expressed understanding and was in agreement with this plan. She also understands that She can call clinic at any time with any questions, concerns, or complaints.   Cancer Staging Follicular lymphoma ([place]) Staging form: Lymphoid Neoplasms, AJCC 6th Edition - Clinical stage from 08/27/2016: Stage IIE (lymphoma only) - Signed by Lloyd Huger, MD on 08/27/2016   Lloyd Huger, MD   05/25/2018 11:16 AM

## 2018-05-21 ENCOUNTER — Encounter: Payer: Self-pay | Admitting: Oncology

## 2018-05-21 ENCOUNTER — Inpatient Hospital Stay: Payer: Medicare Other | Attending: Oncology

## 2018-05-21 ENCOUNTER — Inpatient Hospital Stay (HOSPITAL_BASED_OUTPATIENT_CLINIC_OR_DEPARTMENT_OTHER): Payer: Medicare Other | Admitting: Oncology

## 2018-05-21 VITALS — BP 113/74 | HR 94 | Temp 97.4°F | Resp 18 | Ht 59.0 in | Wt 100.3 lb

## 2018-05-21 DIAGNOSIS — D72819 Decreased white blood cell count, unspecified: Secondary | ICD-10-CM | POA: Insufficient documentation

## 2018-05-21 DIAGNOSIS — C8208 Follicular lymphoma grade I, lymph nodes of multiple sites: Secondary | ICD-10-CM

## 2018-05-21 DIAGNOSIS — Z79899 Other long term (current) drug therapy: Secondary | ICD-10-CM | POA: Insufficient documentation

## 2018-05-21 LAB — COMPREHENSIVE METABOLIC PANEL
ALBUMIN: 4.2 g/dL (ref 3.5–5.0)
ALT: 23 U/L (ref 0–44)
AST: 30 U/L (ref 15–41)
Alkaline Phosphatase: 84 U/L (ref 38–126)
Anion gap: 9 (ref 5–15)
BILIRUBIN TOTAL: 0.7 mg/dL (ref 0.3–1.2)
BUN: 11 mg/dL (ref 8–23)
CHLORIDE: 107 mmol/L (ref 98–111)
CO2: 23 mmol/L (ref 22–32)
CREATININE: 0.67 mg/dL (ref 0.44–1.00)
Calcium: 9.6 mg/dL (ref 8.9–10.3)
GFR calc Af Amer: >60 mL/min (ref >60)
GFR calc non Af Amer: >60 mL/min (ref >60)
GLUCOSE: 104 mg/dL — AB (ref 70–99)
POTASSIUM: 4.1 mmol/L (ref 3.5–5.1)
Sodium: 139 mmol/L (ref 135–145)
TOTAL PROTEIN: 6.6 g/dL (ref 6.5–8.1)

## 2018-05-21 LAB — CBC WITH DIFFERENTIAL/PLATELET
Basophils Absolute: 0 10*3/uL (ref 0–0.1)
Basophils Relative: 1 %
EOS PCT: 2 %
Eosinophils Absolute: 0 10*3/uL (ref 0–0.7)
HCT: 39.3 % (ref 35.0–47.0)
Hemoglobin: 13.6 g/dL (ref 12.0–16.0)
LYMPHS ABS: 1 10*3/uL (ref 1.0–3.6)
LYMPHS PCT: 31 %
MCH: 33.3 pg (ref 26.0–34.0)
MCHC: 34.5 g/dL (ref 32.0–36.0)
MCV: 96.4 fL (ref 80.0–100.0)
MONO ABS: 0.6 10*3/uL (ref 0.2–0.9)
Monocytes Relative: 18 %
Neutro Abs: 1.6 10*3/uL (ref 1.4–6.5)
Neutrophils Relative %: 48 %
PLATELETS: 164 10*3/uL (ref 150–440)
RBC: 4.08 MIL/uL (ref 3.80–5.20)
RDW: 12.9 % (ref 11.5–14.5)
WBC: 3.2 10*3/uL — ABNORMAL LOW (ref 3.6–11.0)

## 2018-05-21 MED ORDER — HEPARIN SOD (PORK) LOCK FLUSH 100 UNIT/ML IV SOLN
500.0000 [IU] | Freq: Once | INTRAVENOUS | Status: AC
  Administered 2018-05-21: 500 [IU] via INTRAVENOUS

## 2018-05-21 MED ORDER — SODIUM CHLORIDE 0.9% FLUSH
10.0000 mL | Freq: Once | INTRAVENOUS | Status: AC
  Administered 2018-05-21: 10 mL via INTRAVENOUS
  Filled 2018-05-21: qty 10

## 2018-05-21 MED ORDER — LIDOCAINE-PRILOCAINE 2.5-2.5 % EX CREA: 1.0000 "application " | TOPICAL_CREAM | CUTANEOUS | 1 refills | Status: DC | PRN

## 2018-05-21 NOTE — Progress Notes (Signed)
No c/o picking up weight, eating good

## 2018-06-07 IMAGING — CT CT CHEST W/ CM
2 of 3 series · 13 of 36 positions shown, 16 images · IV contrast (iopamidol)
Comparison: 07/29/2017 chest radiograph.  06/11/2017 chest CT.

CLINICAL DATA: Recurrent follicular lymphoma. Persistent cough and
right chest and back pain.

EXAM:
CT CHEST WITH CONTRAST
TECHNIQUE: Multidetector CT imaging of the chest was performed during
intravenous contrast administration.
CONTRAST:  75mL BWPDEJ-XVV IOPAMIDOL (BWPDEJ-XVV) INJECTION 61%

[Series 2: axial st · axial · 0.63mm/px · z∈[-316,-58]mm · 10 of 153 slices shown, 13 images]
[im 12/153  mediastinal]
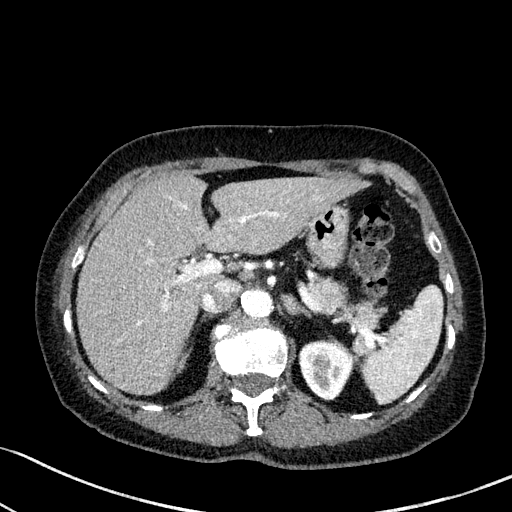
[im 12/153  lung]
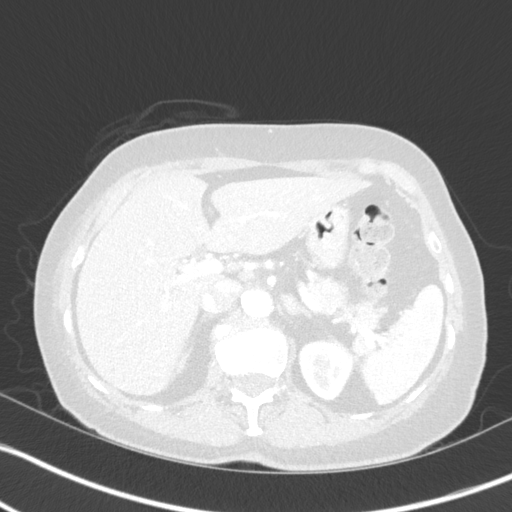
[im 23/153  lung]
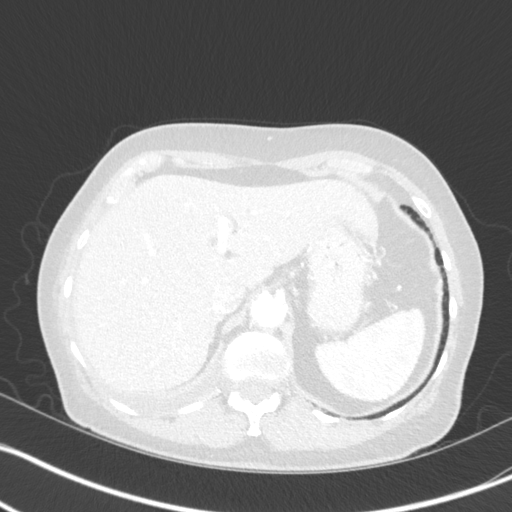
[im 40/153  lung]
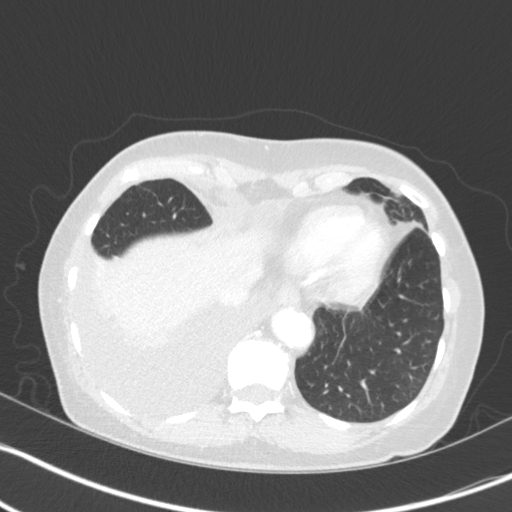
[im 57/153  lung]
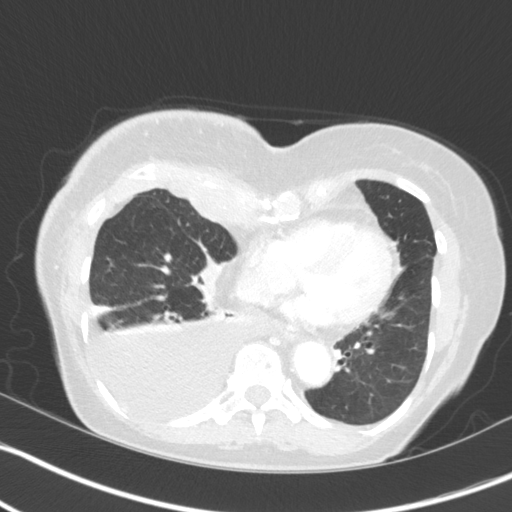
[im 68/153  mediastinal]
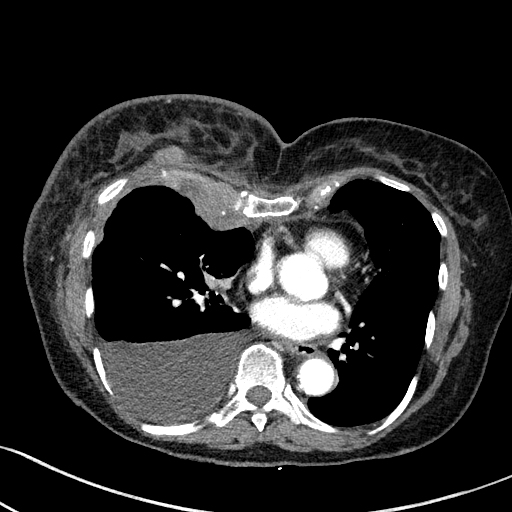
[im 68/153  lung]
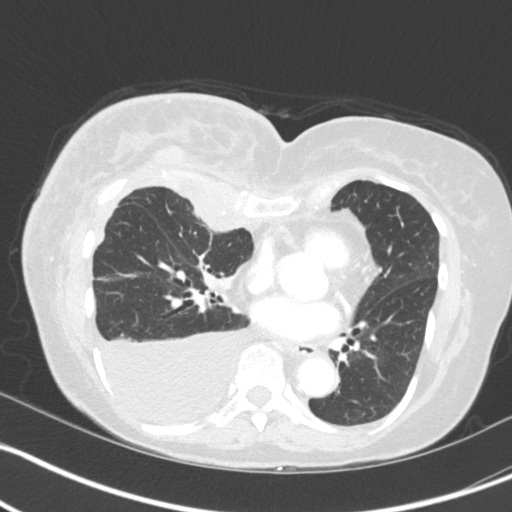
[im 85/153  lung]
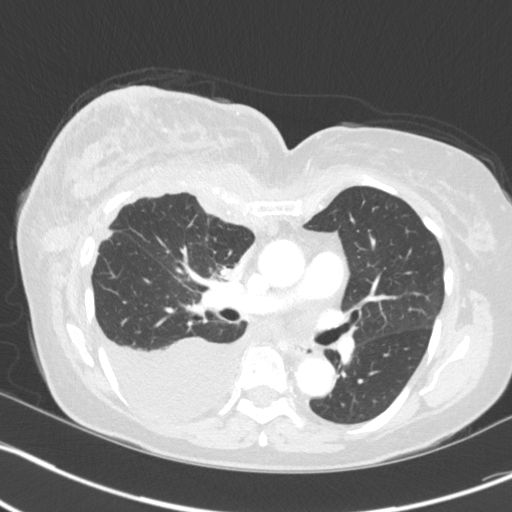
[im 96/153  lung]
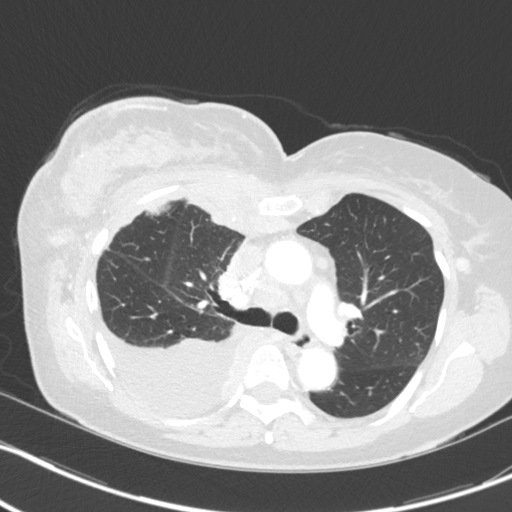
[im 113/153  lung]
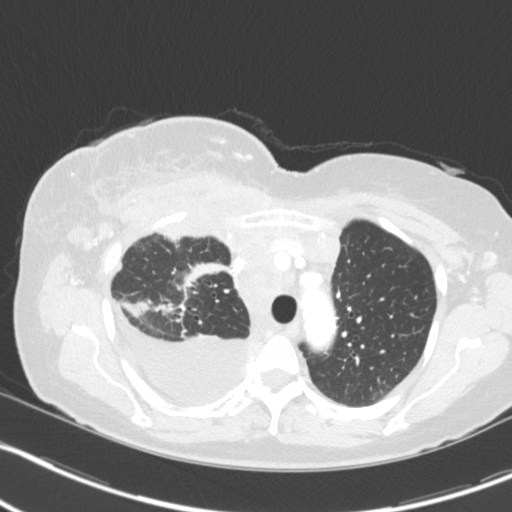
[im 130/153  mediastinal]
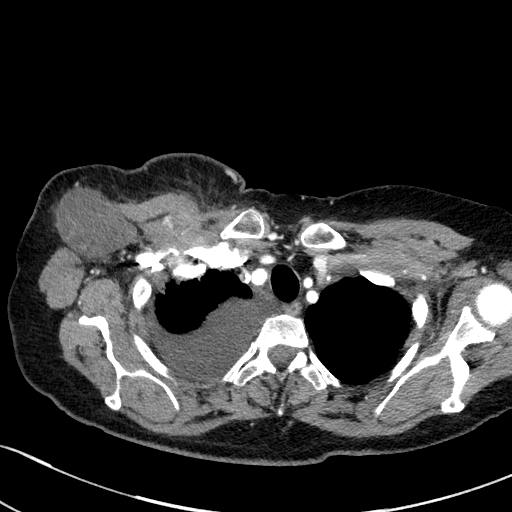
[im 130/153  lung]
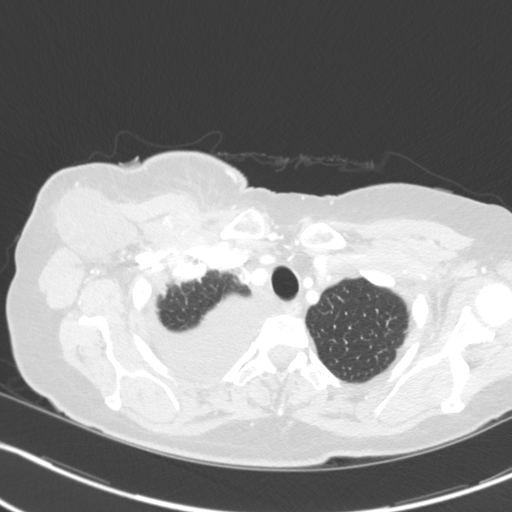
[im 141/153  lung]
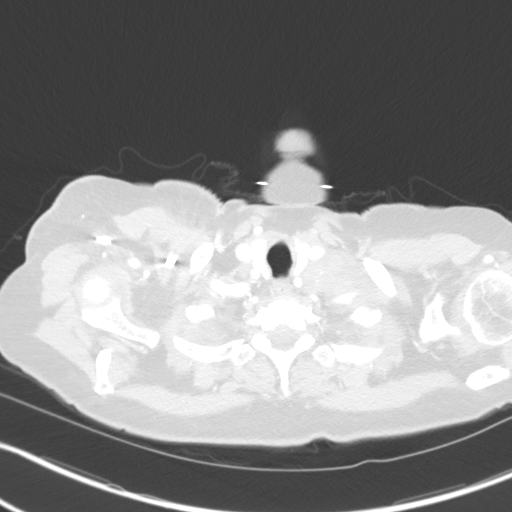

[Series 5: coronal · coronal · 0.61mm/px · 3 of 108 slices shown]
[im 22/108  lung]
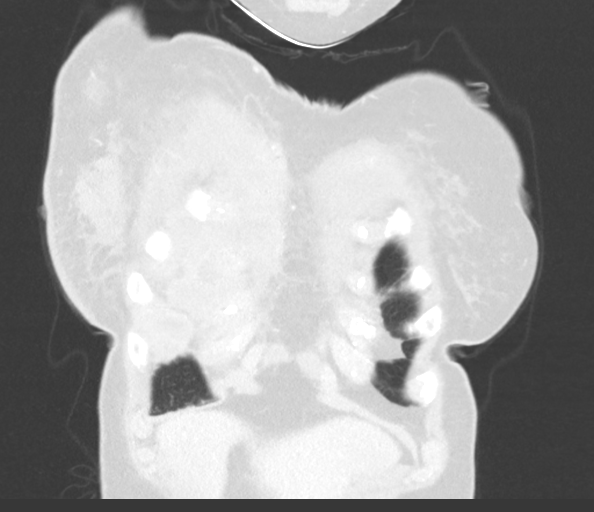
[im 43/108  lung]
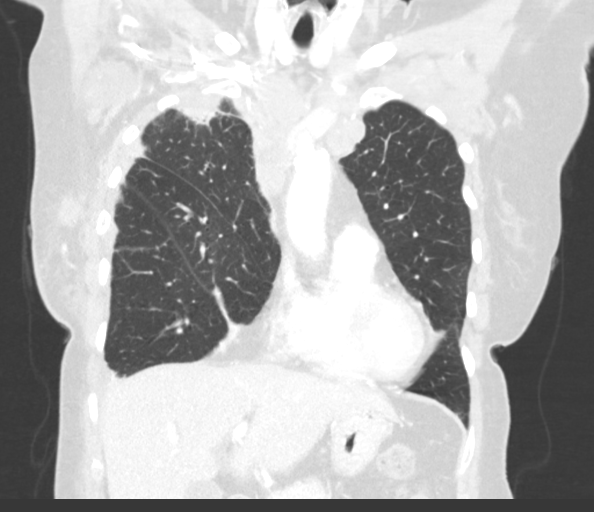
[im 65/108  lung]
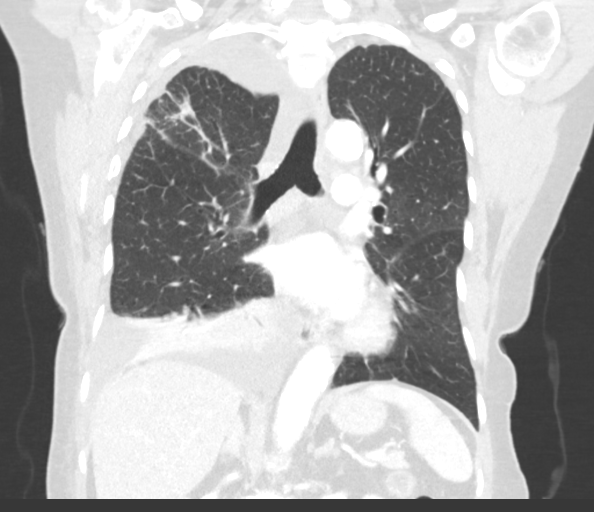

[13 of 36 positions shown; findings below may reference images not displayed]

FINDINGS: Cardiovascular: Normal heart size. No significant pericardial
fluid/thickening. Left main and left anterior descending coronary
atherosclerosis. Atherosclerotic nonaneurysmal thoracic aorta.
Normal caliber pulmonary arteries. No central pulmonary emboli.

Mediastinum/Nodes: No discrete thyroid nodules. Unremarkable
esophagus. Bulky centrally necrotic 4.1 cm dominant right axillary
node (series 2/image 28), previously 4.5 cm on 06/11/2017, mildly
decreased. There is marked progression of an infiltrative
heterogeneous soft tissue mass throughout the right ventral chest
wall, with invasion of the right pectoral muscles, encasement of the
anterior right first through sixth ribs, encasement of the right
internal mammary vessels and involvement of the anterior right
pleural space, measuring up to 4.0 cm thickness (series 2/ image
74), significantly increased from 0.9 cm. Infiltrative left
supraclavicular adenopathy measuring up to 2.9 cm (series 2/ image
12), decreased from 3.7 cm. Infiltrative left retropectoral
adenopathy measuring up to 2.1 cm (series 2/image 29), previously
2.4 cm, mildly decreased. Some of the mildly enlarged left axillary
nodes are mildly increased, largest 1.1 cm (series 2/image 54),
previously 0.9 cm. New bulky infiltrative right prevascular
mediastinal adenopathy measuring up to 2.1 cm (series 2/image 53).
New infiltrative bulky right paratracheal adenopathy measuring up to
3.2 cm (series 2/image 43). Infiltrative 1.8 cm left prevascular
mediastinal node (series 2/image 43), previously 1.6 cm, mildly
increased. New left internal mammary adenopathy measuring up to
cm (series 2/image 49). New right pericardial phrenic adenopathy
measuring up to 1.7 cm (series 2/image 106). New right T5-T7
paraspinal posterior mediastinal adenopathy measuring up to 1.5 cm
at the T6 level (series 2/image 49).

Lungs/Pleura: No pneumothorax. Moderate dependent right pleural
effusion is new. No left pleural effusion. Moderate compressive
atelectasis in the dependent right lower lobe. New sharply
marginated bandlike consolidation in the anterior right upper lobe
compatible with postradiation change. New 6 mm sub solid posterior
left upper lobe nodule (series 3/ image 34).

Upper abdomen: Unremarkable.

Musculoskeletal: No aggressive appearing focal osseous lesions.
Moderate thoracic spondylosis. New asymmetric lobular
heterogeneously enhancing soft tissue throughout the right breast
parenchyma. Asymmetric new skin thickening throughout the right
breast.
IMPRESSION: 1. Marked progression of infiltrative lymphoma throughout the
ventral right chest wall with new widespread involvement of the
right breast parenchyma and right pectoral musculature and
encasement of the right anterior first through sixth ribs with
invasion of the anterior right pleural space.
2. Marked progression of infiltrative lymphoma throughout the right
greater than left mediastinum including bilateral prevascular, right
paratracheal and right paraspinal regions.
3. Necrotic dominant right axillary node is mildly decreased in
size. Infiltrative left supraclavicular and left retropectoral
adenopathy is mildly decreased.
4. New radiation pneumonitis in the right upper lobe. New
nonspecific subcentimeter subsolid left upper lobe pulmonary nodule,
for which attention on follow-up chest CT is advised in 3-6 months.
5. New moderate dependent right pleural effusion.
6. Left main and 1 vessel coronary atherosclerosis.

Aortic Atherosclerosis ([id number]).

These results will be called to the ordering clinician or
representative by the [HOSPITAL] at the imaging location.

## 2018-06-23 ENCOUNTER — Telehealth: Payer: Self-pay | Admitting: *Deleted

## 2018-06-23 ENCOUNTER — Other Ambulatory Visit: Payer: Self-pay | Admitting: *Deleted

## 2018-06-23 NOTE — Telephone Encounter (Signed)
Per Dr. Grayland Ormond move CT scans up to this week, see him 1-2 days later. Will send schedule message regarding updates to appointments.

## 2018-06-23 NOTE — Telephone Encounter (Addendum)
Daughter called and states that patient Right collar bone is more prominent and that is where her cancer started. She is asking what should be done. Please advise

## 2018-06-23 NOTE — Telephone Encounter (Signed)
Per VO Dr Grayland Ormond / Pietro Cassis, RN. He is going to move her October scans up and see her a couple days afterwards. Robin informed to expect a call from schedulers with new dates.

## 2018-06-26 ENCOUNTER — Ambulatory Visit
Admission: RE | Admit: 2018-06-26 | Discharge: 2018-06-26 | Disposition: A | Discharge status: Home / Self Care | Payer: Medicare Other | Source: Ambulatory Visit | Attending: Oncology | Admitting: Oncology

## 2018-06-26 ENCOUNTER — Ambulatory Visit: Admission: RE | Admit: 2018-06-26 | Payer: Medicare Other | Source: Ambulatory Visit

## 2018-06-26 DIAGNOSIS — M4316 Spondylolisthesis, lumbar region: Secondary | ICD-10-CM | POA: Insufficient documentation

## 2018-06-26 DIAGNOSIS — J320 Chronic maxillary sinusitis: Secondary | ICD-10-CM | POA: Insufficient documentation

## 2018-06-26 DIAGNOSIS — M5136 Other intervertebral disc degeneration, lumbar region: Secondary | ICD-10-CM | POA: Insufficient documentation

## 2018-06-26 DIAGNOSIS — C8208 Follicular lymphoma grade I, lymph nodes of multiple sites: Secondary | ICD-10-CM | POA: Diagnosis present

## 2018-06-26 DIAGNOSIS — Q676 Pectus excavatum: Secondary | ICD-10-CM | POA: Insufficient documentation

## 2018-06-26 DIAGNOSIS — I2510 Atherosclerotic heart disease of native coronary artery without angina pectoris: Secondary | ICD-10-CM | POA: Diagnosis not present

## 2018-06-26 DIAGNOSIS — I700 Atherosclerosis of aorta: Secondary | ICD-10-CM | POA: Diagnosis not present

## 2018-06-26 MED ORDER — IOPAMIDOL (ISOVUE-300) INJECTION 61%
85.0000 mL | Freq: Once | INTRAVENOUS | Status: AC | PRN
  Administered 2018-06-26: 85 mL via INTRAVENOUS

## 2018-06-29 NOTE — Progress Notes (Signed)
[place]  Telephone:([phone]) [phone] Fax:([phone]) [phone]  ID: Veronica Boyd OB: Apr 02, 1946  MR#: [id number]  LSL#:[id number]  Patient Care Team: Glendon Axe, MD as PCP - General (Internal Medicine) Lloyd Huger, MD as Consulting Physician (Oncology) Leonie Green, MD as Referring Physician (Surgery) [name], MD as Referring Physician (Radiation Oncology) Jacquelin Hawking, NP as Nurse Practitioner (Oncology)  CHIEF COMPLAINT: Progressive follicular lymphoma  INTERVAL HISTORY: Patient returns to clinic today as an add-on to discuss her imaging after becoming concerned with increasing fatigue and possible swelling around her right clavicle.  She otherwise has felt well. She has no neurologic complaints.  She has a good appetite and her weight is stable. She denies any pain. She denies any fevers, night sweats, or weight loss.  She denies any chest pain, shortness of breath, or cough.  She denies any nausea, vomiting, constipation, or diarrhea. She has no urinary complaints.  Patient offers no further specific complaints today.  REVIEW OF SYSTEMS:   Review of Systems  Constitutional: Positive for malaise/fatigue. Negative for fever and weight loss.  Respiratory: Negative.  Negative for cough, hemoptysis and shortness of breath.   Cardiovascular: Negative.  Negative for chest pain and leg swelling.  Gastrointestinal: Negative.  Negative for abdominal pain, blood in stool, melena, nausea and vomiting.  Genitourinary: Negative.  Negative for dysuria.  Musculoskeletal: Negative.  Negative for back pain and joint pain.  Skin: Negative.  Negative for rash.  Neurological: Negative.  Negative for sensory change, focal weakness, weakness and headaches.  Psychiatric/Behavioral: Negative.  The patient is not nervous/anxious and does not have insomnia.    As per HPI. Otherwise, a complete review of systems is negative.   PAST MEDICAL HISTORY: Past  Medical History:  Diagnosis Date  . Arthritis   . Cataract    bilateral  . Chicken pox   . Colon polyp   . Follicular lymphoma ([place]) 08/2016   lymph nodes   . Hyperlipidemia   . Osteoporosis     PAST SURGICAL HISTORY: Past Surgical History:  Procedure Laterality Date  . AXILLARY LYMPH NODE DISSECTION Right 08/21/2016   Procedure: AXILLARY LYMPH NODE excision;  Surgeon: Leonie Green, MD;  Location: [facility];  Service: General;  Laterality: Right;  . CATARACT EXTRACTION, BILATERAL Bilateral   . JOINT REPLACEMENT Left   . PORTA CATH INSERTION N/A 09/16/2017   Procedure: PORTA CATH INSERTION;  Surgeon: Algernon Huxley, MD;  Location: [facility];  Service: Cardiovascular;  Laterality: N/A;  . TONSILLECTOMY    . TOTAL HIP ARTHROPLASTY Left 1992    FAMILY HISTORY: Family History  Problem Relation Age of Onset  . Diabetes Sister   . Lung cancer Brother   . Diabetes Brother   . Basal cell carcinoma Daughter   . Breast cancer Paternal Aunt     ADVANCED DIRECTIVES (Y/N):  N  HEALTH MAINTENANCE: Social History   Tobacco Use  . Smoking status: Never Smoker  . Smokeless tobacco: Never Used  Substance Use Topics  . Alcohol use: No  . Drug use: No     Colonoscopy:  PAP:  Bone density:  Lipid panel:  No Known Allergies  Current Outpatient Medications  Medication Sig Dispense Refill  . albuterol (PROVENTIL HFA;VENTOLIN HFA) 108 (90 Base) MCG/ACT inhaler Inhale 2 puffs into the lungs every 6 (six) hours as needed for wheezing or shortness of breath. 1 Inhaler 2  . digoxin 62.5 MCG TABS Take 0.125 mg by mouth  daily. 30 tablet 0  . dronabinol (MARINOL) 5 MG capsule Take 1 capsule (5 mg total) by mouth 2 (two) times daily before lunch and supper. 20 capsule 0  . HYDROcodone-acetaminophen (NORCO/VICODIN) 5-325 MG tablet Take 2 tablets by mouth every 6 (six) hours as needed for moderate pain. 20 tablet 0  . lidocaine-prilocaine (EMLA) cream Apply 1 application  topically as needed. 30 g 1  . liver oil-zinc oxide (DESITIN) 40 % ointment Apply topically as needed for irritation. 56.7 g 0  . loperamide (IMODIUM) 2 MG capsule Take 2 mg by mouth as needed for diarrhea or loose stools.    . megestrol (MEGACE) 40 MG tablet TAKE 1 TABLET BY MOUTH EVERY DAY 30 tablet 2  . metoprolol tartrate 37.5 MG TABS Take 37.5 mg by mouth 2 (two) times daily. 60 tablet 0  . Multiple Vitamins-Minerals (CENTRUM SILVER PO) Take 1 tablet by mouth daily.    Marland Kitchen nystatin (MYCOSTATIN) 100000 UNIT/ML suspension Take 5 mLs (500,000 Units total) by mouth 4 (four) times daily. 60 mL 0  . ondansetron (ZOFRAN) 8 MG tablet Take 1 tablet (8 mg total) 2 (two) times daily as needed by mouth for refractory nausea / vomiting. 60 tablet 2  . pantoprazole (PROTONIX) 20 MG tablet Take 20 mg by mouth at bedtime.     . potassium chloride (KLOR-CON) 20 MEQ packet Take 20 mEq by mouth daily. 30 tablet 0  . prochlorperazine (COMPAZINE) 10 MG tablet Take 1 tablet (10 mg total) every 6 (six) hours as needed by mouth (Nausea or vomiting). 60 tablet 2  . simvastatin (ZOCOR) 20 MG tablet Take 20 mg by mouth at bedtime.      No current facility-administered medications for this visit.    Facility-Administered Medications Ordered in Other Visits  Medication Dose Route Frequency Provider Last Rate Last Dose  . heparin lock flush 100 unit/mL  500 Units Intravenous Once Lloyd Huger, MD      . heparin lock flush 100 unit/mL  500 Units Intracatheter PRN Lloyd Huger, MD      . sodium chloride flush (NS) 0.9 % injection 10 mL  10 mL Intravenous PRN Lloyd Huger, MD   10 mL at 12/04/17 0901  . sodium chloride flush (NS) 0.9 % injection 10 mL  10 mL Intracatheter PRN Lloyd Huger, MD      . yttrium-90 injection 00.4 millicurie  59.9 millicurie Intravenous Once [name], MD        OBJECTIVE: Vitals:   06/30/18 0919 06/30/18 0925  BP:  111/70  Pulse:  92  Resp: 16   Temp:   (!) 97.3 F (36.3 C)     Body mass index is 21.49 kg/m.    ECOG FS:0 - Asymptomatic  General: Well-developed, well-nourished, no acute distress. Eyes: Pink conjunctiva, anicteric sclera. HEENT: Normocephalic, moist mucous membranes, clear oropharnyx.  No palpable lymphadenopathy. Lungs: Clear to auscultation bilaterally. Heart: Regular rate and rhythm. No rubs, murmurs, or gallops. Abdomen: Soft, nontender, nondistended. No organomegaly noted, normoactive bowel sounds. Musculoskeletal: No edema, cyanosis, or clubbing. Neuro: Alert, answering all questions appropriately. Cranial nerves grossly intact. Skin: No rashes or petechiae noted. Psych: Normal affect. Lymphatics: Right axillary lymph node decreased in size.  LAB RESULTS:  Lab Results  Component Value Date   NA 139 05/21/2018   K 4.1 05/21/2018   CL 107 05/21/2018   CO2 23 05/21/2018   GLUCOSE 104 (H) 05/21/2018   BUN 11 05/21/2018   CREATININE 0.67  05/21/2018   CALCIUM 9.6 05/21/2018   PROT 6.6 05/21/2018   ALBUMIN 4.2 05/21/2018   AST 30 05/21/2018   ALT 23 05/21/2018   ALKPHOS 84 05/21/2018   BILITOT 0.7 05/21/2018   GFRNONAA >60 05/21/2018   GFRAA >60 05/21/2018    Lab Results  Component Value Date   WBC 3.2 (L) 05/21/2018   NEUTROABS 1.6 05/21/2018   HGB 13.6 05/21/2018   HCT 39.3 05/21/2018   MCV 96.4 05/21/2018   PLT 164 05/21/2018     STUDIES: Ct Soft Tissue Neck W Contrast  Result Date: 06/26/2018 CLINICAL DATA:  Follicular lymphoma restaging. EXAM: CT OF THE NECK WITH CONTRAST CT OF THE CHEST WITH CONTRAST CT OF THE ABDOMEN AND PELVIS WITH CONTRAST TECHNIQUE: Multidetector CT imaging of the neck was performed with intravenous contrast.; Multidetector CT imaging of the abdomen and pelvis was performed following the standard protocol during bolus administration of intravenous contrast.; Multidetector CT imaging of the chest was performed following the standard protocol during bolus administration of  intravenous contrast. CONTRAST:  63m ISOVUE-300 IOPAMIDOL (ISOVUE-300) INJECTION 61% COMPARISON:  Multiple exams, including PET-CT from 02/19/2018 and diagnostic CT exams from 12/09/2017 FINDINGS: CT NECK FINDINGS Pharynx and larynx: Unremarkable Salivary glands: Unremarkable Thyroid: Unremarkable Lymph nodes: No pathologic nodes in the neck. Vascular: Mild atherosclerotic calcification of the left common carotid artery. Flattening/narrowing of the right internal jugular vein next to the right transverse process of C1, not appreciably changed from 12/09/2017. Right internal jugular Port-A-Cath enters the vein well below this level. No thrombus. Limited intracranial: Unremarkable Visualized orbits: Unremarkable Mastoids and visualized paranasal sinuses: Mild chronic right maxillary sinusitis. Skeleton: Cervical spondylosis and degenerative disc disease with 2 mm degenerative anterolisthesis at C2-3 and C3-4, and loss of disc height at all levels between C2-3 and C6. Upper chest: Deferred for formal CT of the chest. Other: No supplemental non-categorized findings. CT CHEST FINDINGS Cardiovascular: Right Port-A-Cath tip: SVC. Coronary, aortic arch, and branch vessel atherosclerotic vascular disease. Mediastinum/Nodes: Pectus excavatum, Haller index 3.1. Rim calcified right axillary mass mass with faint internal heterogeneous density, 3.0 by 2.4 cm on image 15/3, previously measuring 4.0 by 3.4 cm on the prior PET-CT from 02/19/2018, and previously not hypermetabolic. This may well represent a chronic postoperative finding. The reduction in size is reassuring. Just below this is a separate right axillary lymph node measuring 0.5 cm in short axis on image 20/3, formerly 0.7 cm and formerly not appreciably hypermetabolic. Lungs/Pleura: Biapical pleuroparenchymal scarring. Radiation therapy related findings laterally in the right upper lobe. No findings of metastatic disease to the lungs. Musculoskeletal: Unremarkable CT  ABDOMEN AND PELVIS FINDINGS Hepatobiliary: Contracted gallbladder.  Otherwise unremarkable. Pancreas: Unremarkable Spleen: Unremarkable.  No splenomegaly or focal lesion identified. Adrenals/Urinary Tract: Unremarkable Stomach/Bowel: Borderline wall thickening in a posterior loop of proximal jejunum may be incidental or may reflect mild proximal enteritis. I am skeptical of this being a manifestation of lymphoma. Vascular/Lymphatic: Aortoiliac atherosclerotic vascular disease. Porta hepatis node 0.6 cm in short axis on image 56/3, not pathologically enlarged. Overall no pathologic adenopathy is observed in the abdomen/pelvis. Reproductive: The endometrium measures up to 9 mm in thickness which is prominent for age. Adnexa unremarkable. Other: No supplemental non-categorized findings. Musculoskeletal: Left total hip prosthesis noted with abnormal bony demineralization and fluid density structure in the left posterior superior acetabulum. This was also present at the time of the prior PET-CT, with only low-grade associated metabolic activity, appearance favoring particulate disease. There 4 mm of degenerative grade 1 anterolisthesis  at L4-5, without pars defects. IMPRESSION: 1. No findings of active lymphoma in the neck, chest, abdomen, or pelvis. 2. Further reduction in size of the right axillary mass, which has speckled rim calcification and faint internal heterogeneous density, an which was previously not hypermetabolic. This is likely a chronic postoperative finding. 3. Potential mild proximal enteritis with subtle thickening of a proximal loop of jejunum. 4. The endometrium measures up to 9 mm in thickness which is prominent and potentially abnormal for age. Pelvic sonography is recommended for further characterization. 5. Fluid density bony lesion along the posterior and superior acetabulum on the left along the margins of the prosthesis, favoring particulate disease. 6. Other imaging findings of potential  clinical significance: Mild chronic right maxillary sinusitis. Cervical spondylosis and degenerative disc disease. Aortic Atherosclerosis (ICD10-I70.0). Coronary atherosclerosis. Pectus excavatum. Grade 1 degenerative anterolisthesis at L4-5. Electronically Signed   By: Van Clines M.D.   On: 06/26/2018 10:12   Ct Chest W Contrast  Result Date: 06/26/2018 CLINICAL DATA:  Follicular lymphoma restaging. EXAM: CT OF THE NECK WITH CONTRAST CT OF THE CHEST WITH CONTRAST CT OF THE ABDOMEN AND PELVIS WITH CONTRAST TECHNIQUE: Multidetector CT imaging of the neck was performed with intravenous contrast.; Multidetector CT imaging of the abdomen and pelvis was performed following the standard protocol during bolus administration of intravenous contrast.; Multidetector CT imaging of the chest was performed following the standard protocol during bolus administration of intravenous contrast. CONTRAST:  16m ISOVUE-300 IOPAMIDOL (ISOVUE-300) INJECTION 61% COMPARISON:  Multiple exams, including PET-CT from 02/19/2018 and diagnostic CT exams from 12/09/2017 FINDINGS: CT NECK FINDINGS Pharynx and larynx: Unremarkable Salivary glands: Unremarkable Thyroid: Unremarkable Lymph nodes: No pathologic nodes in the neck. Vascular: Mild atherosclerotic calcification of the left common carotid artery. Flattening/narrowing of the right internal jugular vein next to the right transverse process of C1, not appreciably changed from 12/09/2017. Right internal jugular Port-A-Cath enters the vein well below this level. No thrombus. Limited intracranial: Unremarkable Visualized orbits: Unremarkable Mastoids and visualized paranasal sinuses: Mild chronic right maxillary sinusitis. Skeleton: Cervical spondylosis and degenerative disc disease with 2 mm degenerative anterolisthesis at C2-3 and C3-4, and loss of disc height at all levels between C2-3 and C6. Upper chest: Deferred for formal CT of the chest. Other: No supplemental non-categorized  findings. CT CHEST FINDINGS Cardiovascular: Right Port-A-Cath tip: SVC. Coronary, aortic arch, and branch vessel atherosclerotic vascular disease. Mediastinum/Nodes: Pectus excavatum, Haller index 3.1. Rim calcified right axillary mass mass with faint internal heterogeneous density, 3.0 by 2.4 cm on image 15/3, previously measuring 4.0 by 3.4 cm on the prior PET-CT from 02/19/2018, and previously not hypermetabolic. This may well represent a chronic postoperative finding. The reduction in size is reassuring. Just below this is a separate right axillary lymph node measuring 0.5 cm in short axis on image 20/3, formerly 0.7 cm and formerly not appreciably hypermetabolic. Lungs/Pleura: Biapical pleuroparenchymal scarring. Radiation therapy related findings laterally in the right upper lobe. No findings of metastatic disease to the lungs. Musculoskeletal: Unremarkable CT ABDOMEN AND PELVIS FINDINGS Hepatobiliary: Contracted gallbladder.  Otherwise unremarkable. Pancreas: Unremarkable Spleen: Unremarkable.  No splenomegaly or focal lesion identified. Adrenals/Urinary Tract: Unremarkable Stomach/Bowel: Borderline wall thickening in a posterior loop of proximal jejunum may be incidental or may reflect mild proximal enteritis. I am skeptical of this being a manifestation of lymphoma. Vascular/Lymphatic: Aortoiliac atherosclerotic vascular disease. Porta hepatis node 0.6 cm in short axis on image 56/3, not pathologically enlarged. Overall no pathologic adenopathy is observed in the abdomen/pelvis. Reproductive: The  endometrium measures up to 9 mm in thickness which is prominent for age. Adnexa unremarkable. Other: No supplemental non-categorized findings. Musculoskeletal: Left total hip prosthesis noted with abnormal bony demineralization and fluid density structure in the left posterior superior acetabulum. This was also present at the time of the prior PET-CT, with only low-grade associated metabolic activity, appearance  favoring particulate disease. There 4 mm of degenerative grade 1 anterolisthesis at L4-5, without pars defects. IMPRESSION: 1. No findings of active lymphoma in the neck, chest, abdomen, or pelvis. 2. Further reduction in size of the right axillary mass, which has speckled rim calcification and faint internal heterogeneous density, an which was previously not hypermetabolic. This is likely a chronic postoperative finding. 3. Potential mild proximal enteritis with subtle thickening of a proximal loop of jejunum. 4. The endometrium measures up to 9 mm in thickness which is prominent and potentially abnormal for age. Pelvic sonography is recommended for further characterization. 5. Fluid density bony lesion along the posterior and superior acetabulum on the left along the margins of the prosthesis, favoring particulate disease. 6. Other imaging findings of potential clinical significance: Mild chronic right maxillary sinusitis. Cervical spondylosis and degenerative disc disease. Aortic Atherosclerosis (ICD10-I70.0). Coronary atherosclerosis. Pectus excavatum. Grade 1 degenerative anterolisthesis at L4-5. Electronically Signed   By: Van Clines M.D.   On: 06/26/2018 10:12   Ct Abdomen Pelvis W Contrast  Result Date: 06/26/2018 CLINICAL DATA:  Follicular lymphoma restaging. EXAM: CT OF THE NECK WITH CONTRAST CT OF THE CHEST WITH CONTRAST CT OF THE ABDOMEN AND PELVIS WITH CONTRAST TECHNIQUE: Multidetector CT imaging of the neck was performed with intravenous contrast.; Multidetector CT imaging of the abdomen and pelvis was performed following the standard protocol during bolus administration of intravenous contrast.; Multidetector CT imaging of the chest was performed following the standard protocol during bolus administration of intravenous contrast. CONTRAST:  73m ISOVUE-300 IOPAMIDOL (ISOVUE-300) INJECTION 61% COMPARISON:  Multiple exams, including PET-CT from 02/19/2018 and diagnostic CT exams from  12/09/2017 FINDINGS: CT NECK FINDINGS Pharynx and larynx: Unremarkable Salivary glands: Unremarkable Thyroid: Unremarkable Lymph nodes: No pathologic nodes in the neck. Vascular: Mild atherosclerotic calcification of the left common carotid artery. Flattening/narrowing of the right internal jugular vein next to the right transverse process of C1, not appreciably changed from 12/09/2017. Right internal jugular Port-A-Cath enters the vein well below this level. No thrombus. Limited intracranial: Unremarkable Visualized orbits: Unremarkable Mastoids and visualized paranasal sinuses: Mild chronic right maxillary sinusitis. Skeleton: Cervical spondylosis and degenerative disc disease with 2 mm degenerative anterolisthesis at C2-3 and C3-4, and loss of disc height at all levels between C2-3 and C6. Upper chest: Deferred for formal CT of the chest. Other: No supplemental non-categorized findings. CT CHEST FINDINGS Cardiovascular: Right Port-A-Cath tip: SVC. Coronary, aortic arch, and branch vessel atherosclerotic vascular disease. Mediastinum/Nodes: Pectus excavatum, Haller index 3.1. Rim calcified right axillary mass mass with faint internal heterogeneous density, 3.0 by 2.4 cm on image 15/3, previously measuring 4.0 by 3.4 cm on the prior PET-CT from 02/19/2018, and previously not hypermetabolic. This may well represent a chronic postoperative finding. The reduction in size is reassuring. Just below this is a separate right axillary lymph node measuring 0.5 cm in short axis on image 20/3, formerly 0.7 cm and formerly not appreciably hypermetabolic. Lungs/Pleura: Biapical pleuroparenchymal scarring. Radiation therapy related findings laterally in the right upper lobe. No findings of metastatic disease to the lungs. Musculoskeletal: Unremarkable CT ABDOMEN AND PELVIS FINDINGS Hepatobiliary: Contracted gallbladder.  Otherwise unremarkable. Pancreas: Unremarkable Spleen: Unremarkable.  No splenomegaly or focal lesion  identified. Adrenals/Urinary Tract: Unremarkable Stomach/Bowel: Borderline wall thickening in a posterior loop of proximal jejunum may be incidental or may reflect mild proximal enteritis. I am skeptical of this being a manifestation of lymphoma. Vascular/Lymphatic: Aortoiliac atherosclerotic vascular disease. Porta hepatis node 0.6 cm in short axis on image 56/3, not pathologically enlarged. Overall no pathologic adenopathy is observed in the abdomen/pelvis. Reproductive: The endometrium measures up to 9 mm in thickness which is prominent for age. Adnexa unremarkable. Other: No supplemental non-categorized findings. Musculoskeletal: Left total hip prosthesis noted with abnormal bony demineralization and fluid density structure in the left posterior superior acetabulum. This was also present at the time of the prior PET-CT, with only low-grade associated metabolic activity, appearance favoring particulate disease. There 4 mm of degenerative grade 1 anterolisthesis at L4-5, without pars defects. IMPRESSION: 1. No findings of active lymphoma in the neck, chest, abdomen, or pelvis. 2. Further reduction in size of the right axillary mass, which has speckled rim calcification and faint internal heterogeneous density, an which was previously not hypermetabolic. This is likely a chronic postoperative finding. 3. Potential mild proximal enteritis with subtle thickening of a proximal loop of jejunum. 4. The endometrium measures up to 9 mm in thickness which is prominent and potentially abnormal for age. Pelvic sonography is recommended for further characterization. 5. Fluid density bony lesion along the posterior and superior acetabulum on the left along the margins of the prosthesis, favoring particulate disease. 6. Other imaging findings of potential clinical significance: Mild chronic right maxillary sinusitis. Cervical spondylosis and degenerative disc disease. Aortic Atherosclerosis (ICD10-I70.0). Coronary  atherosclerosis. Pectus excavatum. Grade 1 degenerative anterolisthesis at L4-5. Electronically Signed   By: Van Clines M.D.   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  PLAN:    1. Recurrent follicular lymphoma: CT scan results from June 26, 2018 reviewed independently and report as above with no suspicious findings of recurrent or progressive disease.  Patient received Zevalin on July 30, 2017 with not much therapeutic effect.  She subsequently underwent cycles 5 of R-CHOP chemotherapy with Neulasta support completing on December 12, 2017.  No further intervention or treatments are needed at this time.  Patient did not wish to pursue maintenance Rituxan.  Return to clinic in 3 months with repeat laboratory work.  Will repeat imaging in 6 months in February 2020.   2.  Weakness and fatigue: Improving, unclear etiology.  Monitor.  I spent a total of 30 minutes face-to-face with the patient of which greater than 50% of the visit was spent in counseling and coordination of care as detailed above.  Patient expressed understanding and was in agreement with this plan. She also understands that She can call clinic at any time with any questions, concerns, or complaints.   Cancer Staging Follicular lymphoma ([place]) Staging form: Lymphoid Neoplasms, AJCC 6th Edition - Clinical stage from 08/27/2016: Stage IIE (lymphoma only) - Signed by Lloyd Huger, MD on 08/27/2016   Lloyd Huger, MD   07/01/2018 12:22 PM

## 2018-06-30 ENCOUNTER — Other Ambulatory Visit: Payer: Self-pay

## 2018-06-30 ENCOUNTER — Inpatient Hospital Stay: Payer: Medicare Other

## 2018-06-30 ENCOUNTER — Inpatient Hospital Stay: Payer: Medicare Other | Attending: Oncology | Admitting: Oncology

## 2018-06-30 ENCOUNTER — Encounter: Payer: Self-pay | Admitting: Oncology

## 2018-06-30 VITALS — BP 111/70 | HR 92 | Temp 97.3°F | Resp 16 | Ht 59.0 in | Wt 106.4 lb

## 2018-06-30 DIAGNOSIS — C8208 Follicular lymphoma grade I, lymph nodes of multiple sites: Secondary | ICD-10-CM | POA: Diagnosis not present

## 2018-06-30 DIAGNOSIS — Z95828 Presence of other vascular implants and grafts: Secondary | ICD-10-CM

## 2018-06-30 DIAGNOSIS — R531 Weakness: Secondary | ICD-10-CM | POA: Diagnosis not present

## 2018-06-30 DIAGNOSIS — R5383 Other fatigue: Secondary | ICD-10-CM

## 2018-06-30 MED ORDER — HEPARIN SOD (PORK) LOCK FLUSH 100 UNIT/ML IV SOLN
500.0000 [IU] | Freq: Once | INTRAVENOUS | Status: AC
  Administered 2018-06-30: 500 [IU] via INTRAVENOUS

## 2018-06-30 MED ORDER — SODIUM CHLORIDE 0.9% FLUSH
10.0000 mL | INTRAVENOUS | Status: DC | PRN
  Administered 2018-06-30: 10 mL via INTRAVENOUS
  Filled 2018-06-30: qty 10

## 2018-06-30 NOTE — Progress Notes (Signed)
Patient has been light headed and had episodes of vertigo. Family reports that her neck appears swollen bilaterally.

## 2018-07-21 ENCOUNTER — Telehealth: Payer: Self-pay

## 2018-07-21 NOTE — Telephone Encounter (Signed)
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  Patient currently under observation.    Spoke with patient via phone this pm.  Patient reports that she is eating better and doing much better.  Reports that she has gained weight.  "I am doing good."  Medications: reviewed  Labs: reviewed  Anthropometrics:   Weight has increased to 106 lb on 8/26 from 86 lb on 01/02/18 last RD follow-up.     NUTRITION DIAGNOSIS: malnutrition improved   MALNUTRITION DIAGNOSIS: severe malnutrition improved  INTERVENTION:  Encouraged patient to continue to eat foods high calorie and high protein.  Patient has contact information and knows to contact RD with nutrition questions or concerns.    MONITORING, EVALUATION, GOAL: weight trends, intake   NEXT VISIT: patient to contact RD if needed  Katianne Barre B. Zenia Resides, [place], [place] Registered Dietitian [phone] (pager)

## 2018-08-11 ENCOUNTER — Inpatient Hospital Stay: Payer: Medicare Other | Attending: Oncology

## 2018-08-11 DIAGNOSIS — Z452 Encounter for adjustment and management of vascular access device: Secondary | ICD-10-CM | POA: Diagnosis present

## 2018-08-11 DIAGNOSIS — C8208 Follicular lymphoma grade I, lymph nodes of multiple sites: Secondary | ICD-10-CM | POA: Diagnosis present

## 2018-08-11 DIAGNOSIS — Z95828 Presence of other vascular implants and grafts: Secondary | ICD-10-CM

## 2018-08-11 MED ORDER — HEPARIN SOD (PORK) LOCK FLUSH 100 UNIT/ML IV SOLN
500.0000 [IU] | Freq: Once | INTRAVENOUS | Status: AC
  Administered 2018-08-11: 500 [IU] via INTRAVENOUS

## 2018-08-11 MED ORDER — SODIUM CHLORIDE 0.9% FLUSH
10.0000 mL | Freq: Once | INTRAVENOUS | Status: AC
  Administered 2018-08-11: 10 mL via INTRAVENOUS
  Filled 2018-08-11: qty 10

## 2018-08-11 MED ORDER — HEPARIN SOD (PORK) LOCK FLUSH 100 UNIT/ML IV SOLN
INTRAVENOUS | Status: AC
  Filled 2018-08-11: qty 5

## 2018-08-19 ENCOUNTER — Ambulatory Visit: Payer: Medicare Other

## 2018-08-21 ENCOUNTER — Ambulatory Visit: Payer: Medicare Other | Admitting: Oncology

## 2018-08-21 ENCOUNTER — Other Ambulatory Visit: Payer: Medicare Other

## 2018-08-25 DIAGNOSIS — K219 Gastro-esophageal reflux disease without esophagitis: Secondary | ICD-10-CM | POA: Insufficient documentation

## 2018-09-20 NOTE — Progress Notes (Signed)
[place]  [phone] [phone]) [phone]  ID: Veronica Boyd OB: Jul 05, 1946  MR#: [id number]  DPO#:[id number]  Patient Care Team: Glendon Axe, MD as PCP - General (Internal Medicine) Lloyd Huger, MD as Consulting Physician (Oncology) Leonie Green, MD as Referring Physician (Surgery) [name], MD as Referring Physician (Radiation Oncology) Jacquelin Hawking, NP as Nurse Practitioner (Oncology)  CHIEF COMPLAINT: Progressive follicular lymphoma  INTERVAL HISTORY: Patient returns to clinic today for repeat laboratory work and further evaluation.  She currently feels well and is asymptomatic.  She does not complain of any weakness or fatigue.  She has noticed no new lymphadenopathy.  She has no neurologic complaints.  She has a good appetite and her weight is stable. She denies any pain. She denies any fevers, night sweats, or weight loss.  She denies any chest pain, shortness of breath, or cough.  She denies any nausea, vomiting, constipation, or diarrhea. She has no urinary complaints.  Patient does have a baseline offers no specific complaints today.  REVIEW OF SYSTEMS:   Review of Systems  Constitutional: Negative.  Negative for fever, malaise/fatigue and weight loss.  Respiratory: Negative.  Negative for cough, hemoptysis and shortness of breath.   Cardiovascular: Negative.  Negative for chest pain and leg swelling.  Gastrointestinal: Negative.  Negative for abdominal pain, blood in stool, melena, nausea and vomiting.  Genitourinary: Negative.  Negative for dysuria.  Musculoskeletal: Negative.  Negative for back pain and joint pain.  Skin: Negative.  Negative for rash.  Neurological: Negative.  Negative for sensory change, focal weakness, weakness and headaches.  Psychiatric/Behavioral: Negative.  The patient is not nervous/anxious and does not have insomnia.    As per HPI. Otherwise, a complete review of systems is  negative.   PAST MEDICAL HISTORY: Past Medical History:  Diagnosis Date  . Arthritis   . Cataract    bilateral  . Chicken pox   . Colon polyp   . Follicular lymphoma ([place]) 08/2016   lymph nodes   . Hyperlipidemia   . Osteoporosis     PAST SURGICAL HISTORY: Past Surgical History:  Procedure Laterality Date  . AXILLARY LYMPH NODE DISSECTION Right 08/21/2016   Procedure: AXILLARY LYMPH NODE excision;  Surgeon: Leonie Green, MD;  Location: [facility];  Service: General;  Laterality: Right;  . CATARACT EXTRACTION, BILATERAL Bilateral   . JOINT REPLACEMENT Left   . PORTA CATH INSERTION N/A 09/16/2017   Procedure: PORTA CATH INSERTION;  Surgeon: Algernon Huxley, MD;  Location: [facility];  Service: Cardiovascular;  Laterality: N/A;  . TONSILLECTOMY    . TOTAL HIP ARTHROPLASTY Left 1992    FAMILY HISTORY: Family History  Problem Relation Age of Onset  . Diabetes Sister   . Lung cancer Brother   . Diabetes Brother   . Basal cell carcinoma Daughter   . Breast cancer Paternal Aunt     ADVANCED DIRECTIVES (Y/N):  N  HEALTH MAINTENANCE: Social History   Tobacco Use  . Smoking status: Never Smoker  . Smokeless tobacco: Never Used  Substance Use Topics  . Alcohol use: No  . Drug use: No     Colonoscopy:  PAP:  Bone density:  Lipid panel:  No Known Allergies  Current Outpatient Medications  Medication Sig Dispense Refill  . alendronate (FOSAMAX) 70 MG tablet TAKE 1 TABLET EVERY 7 DAYS TAKE WITH A FULL GLASS OF WATER. DO NOT LIE DOWN FOR THE NEXT 30 MIN.  3  .  lidocaine-prilocaine (EMLA) cream Apply 1 application topically as needed. 30 g 1  . Multiple Vitamins-Minerals (CENTRUM SILVER PO) Take 1 tablet by mouth daily.    . pantoprazole (PROTONIX) 20 MG tablet Take 20 mg by mouth at bedtime.     . simvastatin (ZOCOR) 20 MG tablet Take 20 mg by mouth at bedtime.     . traMADol-acetaminophen (ULTRACET) 37.5-325 MG tablet Take 1 tablet by mouth every 8  (eight) hours as needed.     Marland Kitchen albuterol (PROVENTIL HFA;VENTOLIN HFA) 108 (90 Base) MCG/ACT inhaler Inhale 2 puffs into the lungs every 6 (six) hours as needed for wheezing or shortness of breath. (Patient not taking: Reported on 09/22/2018) 1 Inhaler 2  . megestrol (MEGACE) 40 MG tablet TAKE 1 TABLET BY MOUTH EVERY DAY (Patient not taking: Reported on 09/22/2018) 30 tablet 2  . metoprolol tartrate 37.5 MG TABS Take 37.5 mg by mouth 2 (two) times daily. (Patient not taking: Reported on 09/22/2018) 60 tablet 0  . nystatin (MYCOSTATIN) 100000 UNIT/ML suspension Take 5 mLs (500,000 Units total) by mouth 4 (four) times daily. (Patient not taking: Reported on 09/22/2018) 60 mL 0  . ondansetron (ZOFRAN) 8 MG tablet Take 1 tablet (8 mg total) 2 (two) times daily as needed by mouth for refractory nausea / vomiting. (Patient not taking: Reported on 09/22/2018) 60 tablet 2  . prochlorperazine (COMPAZINE) 10 MG tablet Take 1 tablet (10 mg total) every 6 (six) hours as needed by mouth (Nausea or vomiting). (Patient not taking: Reported on 09/22/2018) 60 tablet 2   No current facility-administered medications for this visit.    Facility-Administered Medications Ordered in Other Visits  Medication Dose Route Frequency Provider Last Rate Last Dose  . heparin lock flush 100 unit/mL  500 Units Intravenous Once Lloyd Huger, MD      . heparin lock flush 100 unit/mL  500 Units Intracatheter PRN Lloyd Huger, MD      . sodium chloride flush (NS) 0.9 % injection 10 mL  10 mL Intravenous PRN Lloyd Huger, MD   10 mL at 12/04/17 0901  . sodium chloride flush (NS) 0.9 % injection 10 mL  10 mL Intracatheter PRN Lloyd Huger, MD      . yttrium-90 injection 38.4 millicurie  66.5 millicurie Intravenous Once [name], MD        OBJECTIVE: Vitals:   09/22/18 1132  BP: 129/81  Pulse: (!) 102  Resp: 18  Temp: (!) 95.9 F (35.5 C)     Body mass index is 22.74 kg/m.    ECOG FS:0 -  Asymptomatic  General: Well-developed, well-nourished, no acute distress. Eyes: Pink conjunctiva, anicteric sclera. HEENT: Normocephalic, moist mucous membranes, clear oropharnyx. Lungs: Clear to auscultation bilaterally. Heart: Regular rate and rhythm. No rubs, murmurs, or gallops. Abdomen: Soft, nontender, nondistended. No organomegaly noted, normoactive bowel sounds. Musculoskeletal: No edema, cyanosis, or clubbing. Neuro: Alert, answering all questions appropriately. Cranial nerves grossly intact. Skin: No rashes or petechiae noted. Psych: Normal affect. Lymphatics: No cervical, calvicular, axillary or inguinal LAD.  LAB RESULTS:  Lab Results  Component Value Date   NA 141 09/22/2018   K 3.8 09/22/2018   CL 104 09/22/2018   CO2 26 09/22/2018   GLUCOSE 125 (H) 09/22/2018   BUN 11 09/22/2018   CREATININE 0.67 09/22/2018   CALCIUM 9.5 09/22/2018   PROT 6.6 09/22/2018   ALBUMIN 4.1 09/22/2018   AST 27 09/22/2018   ALT 15 09/22/2018   ALKPHOS 89 09/22/2018  BILITOT 0.4 09/22/2018   GFRNONAA >60 09/22/2018   GFRAA >60 09/22/2018    Lab Results  Component Value Date   WBC 4.2 09/22/2018   NEUTROABS 2.2 09/22/2018   HGB 12.9 09/22/2018   HCT 39.6 09/22/2018   MCV 94.5 09/22/2018   PLT 162 09/22/2018     STUDIES: No results found.   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  PLAN:    1. Recurrent follicular lymphoma: CT scan results from June 26, 2018 reviewed independently with no suspicious findings of recurrent or progressive disease.  Patient received Zevalin on July 30, 2017 with not much therapeutic effect.  She subsequently underwent cycles 5 of R-CHOP chemotherapy with Neulasta support completing on December 12, 2017.  No further intervention or treatments are needed at this time.  Patient did not wish to pursue maintenance Rituxan.  Return to clinic in 3 months with repeat laboratory work and further evaluation.  Will repeat  imaging 1 to 2 days prior to next clinic appointment.    I spent a total of 20 minutes face-to-face with the patient of which greater than 50% of the visit was spent in counseling and coordination of care as detailed above.  Patient expressed understanding and was in agreement with this plan. She also understands that She can call clinic at any time with any questions, concerns, or complaints.   Cancer Staging Follicular lymphoma ([place]) Staging form: Lymphoid Neoplasms, AJCC 6th Edition - Clinical stage from 08/27/2016: Stage IIE (lymphoma only) - Signed by Lloyd Huger, MD on 08/27/2016   Lloyd Huger, MD   09/22/2018 2:14 PM

## 2018-09-22 ENCOUNTER — Inpatient Hospital Stay: Payer: Medicare Other

## 2018-09-22 ENCOUNTER — Inpatient Hospital Stay: Payer: Medicare Other | Attending: Oncology | Admitting: Oncology

## 2018-09-22 ENCOUNTER — Other Ambulatory Visit: Payer: Self-pay

## 2018-09-22 VITALS — BP 129/81 | HR 102 | Temp 95.9°F | Resp 18 | Wt 112.6 lb

## 2018-09-22 DIAGNOSIS — C8208 Follicular lymphoma grade I, lymph nodes of multiple sites: Secondary | ICD-10-CM | POA: Diagnosis present

## 2018-09-22 LAB — COMPREHENSIVE METABOLIC PANEL
ALBUMIN: 4.1 g/dL (ref 3.5–5.0)
ALK PHOS: 89 U/L (ref 38–126)
ALT: 15 U/L (ref 0–44)
ANION GAP: 11 (ref 5–15)
AST: 27 U/L (ref 15–41)
BILIRUBIN TOTAL: 0.4 mg/dL (ref 0.3–1.2)
BUN: 11 mg/dL (ref 8–23)
CALCIUM: 9.5 mg/dL (ref 8.9–10.3)
CO2: 26 mmol/L (ref 22–32)
Chloride: 104 mmol/L (ref 98–111)
Creatinine, Ser: 0.67 mg/dL (ref 0.44–1.00)
GFR calc Af Amer: >60 mL/min (ref >60)
GFR calc non Af Amer: >60 mL/min (ref >60)
GLUCOSE: 125 mg/dL — AB (ref 70–99)
Potassium: 3.8 mmol/L (ref 3.5–5.1)
Sodium: 141 mmol/L (ref 135–145)
TOTAL PROTEIN: 6.6 g/dL (ref 6.5–8.1)

## 2018-09-22 LAB — CBC WITH DIFFERENTIAL/PLATELET
ABS IMMATURE GRANULOCYTES: 0.06 10*3/uL (ref 0.00–0.07)
BASOS PCT: 1 %
Basophils Absolute: 0 10*3/uL (ref 0.0–0.1)
EOS ABS: 0 10*3/uL (ref 0.0–0.5)
Eosinophils Relative: 1 %
HCT: 39.6 % (ref 36.0–46.0)
HEMOGLOBIN: 12.9 g/dL (ref 12.0–15.0)
Immature Granulocytes: 1 %
Lymphocytes Relative: 29 %
Lymphs Abs: 1.2 10*3/uL (ref 0.7–4.0)
MCH: 30.8 pg (ref 26.0–34.0)
MCHC: 32.6 g/dL (ref 30.0–36.0)
MCV: 94.5 fL (ref 80.0–100.0)
MONO ABS: 0.7 10*3/uL (ref 0.1–1.0)
MONOS PCT: 17 %
Neutro Abs: 2.2 10*3/uL (ref 1.7–7.7)
Neutrophils Relative %: 51 %
Platelets: 162 10*3/uL (ref 150–400)
RBC: 4.19 MIL/uL (ref 3.87–5.11)
RDW: 12.9 % (ref 11.5–15.5)
WBC: 4.2 10*3/uL (ref 4.0–10.5)
nRBC: 0 % (ref 0.0–0.2)

## 2018-09-22 MED ORDER — HEPARIN SOD (PORK) LOCK FLUSH 100 UNIT/ML IV SOLN
INTRAVENOUS | Status: AC
  Filled 2018-09-22: qty 5

## 2018-09-22 MED ORDER — HEPARIN SOD (PORK) LOCK FLUSH 100 UNIT/ML IV SOLN
500.0000 [IU] | Freq: Once | INTRAVENOUS | Status: AC
  Administered 2018-09-22: 500 [IU] via INTRAVENOUS

## 2018-09-22 MED ORDER — SODIUM CHLORIDE 0.9% FLUSH
10.0000 mL | Freq: Once | INTRAVENOUS | Status: AC
  Administered 2018-09-22: 10 mL via INTRAVENOUS
  Filled 2018-09-22: qty 10

## 2018-09-22 NOTE — Progress Notes (Signed)
Here for follow up. Per pt" doing good " 

## 2018-11-07 ENCOUNTER — Inpatient Hospital Stay: Payer: Medicare Other | Attending: Oncology

## 2018-11-07 DIAGNOSIS — C8208 Follicular lymphoma grade I, lymph nodes of multiple sites: Secondary | ICD-10-CM | POA: Insufficient documentation

## 2018-11-07 DIAGNOSIS — Z452 Encounter for adjustment and management of vascular access device: Secondary | ICD-10-CM | POA: Diagnosis present

## 2018-11-07 DIAGNOSIS — Z95828 Presence of other vascular implants and grafts: Secondary | ICD-10-CM

## 2018-11-07 MED ORDER — SODIUM CHLORIDE 0.9% FLUSH
10.0000 mL | Freq: Once | INTRAVENOUS | Status: AC
  Administered 2018-11-07: 10 mL via INTRAVENOUS
  Filled 2018-11-07: qty 10

## 2018-11-07 MED ORDER — HEPARIN SOD (PORK) LOCK FLUSH 100 UNIT/ML IV SOLN
500.0000 [IU] | Freq: Once | INTRAVENOUS | Status: AC
  Administered 2018-11-07: 500 [IU] via INTRAVENOUS
  Filled 2018-11-07: qty 5

## 2018-12-21 NOTE — Progress Notes (Signed)
[place]  Telephone:(336) [phone] Fax:(336) [phone]  ID: [name] OB: [birth year]  MR#: [id number]  PTW#:[id number]  Patient Care Team: Glendon Axe, MD as PCP - General (Internal Medicine) Lloyd Huger, MD as Consulting Physician (Oncology) Leonie Green, MD as Referring Physician (Surgery) [name], MD as Referring Physician (Radiation Oncology) Jacquelin Hawking, NP as Nurse Practitioner (Oncology)  CHIEF COMPLAINT: Follicular lymphoma  INTERVAL HISTORY: Patient returns to clinic today for further evaluation and discussion of her imaging results.  She has some musculoskeletal neck pain, but otherwise feels well.  She does not complain of any weakness or fatigue.  She has noticed no new lymphadenopathy.  She has no neurologic complaints.  She has a good appetite and her weight is stable. She denies any other pain. She denies any fevers, night sweats, or weight loss.  She denies any chest pain, shortness of breath, or cough.  She denies any nausea, vomiting, constipation, or diarrhea. She has no urinary complaints.  Patient otherwise feels well and offers no further specific complaints.  REVIEW OF SYSTEMS:   Review of Systems  Constitutional: Negative.  Negative for fever, malaise/fatigue and weight loss.  Respiratory: Negative.  Negative for cough, hemoptysis and shortness of breath.   Cardiovascular: Negative.  Negative for chest pain and leg swelling.  Gastrointestinal: Negative.  Negative for abdominal pain, blood in stool, melena, nausea and vomiting.  Genitourinary: Negative.  Negative for dysuria.  Musculoskeletal: Positive for neck pain. Negative for back pain and joint pain.  Skin: Negative.  Negative for rash.  Neurological: Negative.  Negative for sensory change, focal weakness, weakness and headaches.  Psychiatric/Behavioral: Negative.  The patient is not nervous/anxious and does not have insomnia.    As per HPI.  Otherwise, a complete review of systems is negative.   PAST MEDICAL HISTORY: Past Medical History:  Diagnosis Date  . Arthritis   . Cataract    bilateral  . Chicken pox   . Colon polyp   . 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 Hyperlipidemia   . Osteoporosis     PAST SURGICAL HISTORY: Past Surgical History:  Procedure Laterality Date  . AXILLARY LYMPH NODE DISSECTION Right 08/21/2016   Procedure: AXILLARY LYMPH NODE excision;  Surgeon: Leonie Green, MD;  Location: [facility];  Service: General;  Laterality: Right;  . CATARACT EXTRACTION, BILATERAL Bilateral   . JOINT REPLACEMENT Left   . PORTA CATH INSERTION N/A 09/16/2017   Procedure: PORTA CATH INSERTION;  Surgeon: Algernon Huxley, MD;  Location: [place] [facility];  Service: Cardiovascular;  Laterality: N/A;  . TONSILLECTOMY    . TOTAL HIP ARTHROPLASTY Left 1992    FAMILY HISTORY: Family History  Problem Relation Age of Onset  . Diabetes Sister   . Lung cancer Brother   . Diabetes Brother   . Basal cell carcinoma Daughter   . Breast cancer Paternal Aunt     ADVANCED DIRECTIVES (Y/N):  N  HEALTH MAINTENANCE: Social History   Tobacco Use  . Smoking status: Never Smoker  . Smokeless tobacco: Never Used  Substance Use Topics  . Alcohol use: No  . Drug use: No     Colonoscopy:  PAP:  Bone density:  Lipid panel:  No Known Allergies  Current Outpatient Medications  Medication Sig Dispense Refill  . alendronate (FOSAMAX) 70 MG tablet TAKE 1 TABLET EVERY 7 DAYS TAKE WITH A FULL GLASS OF WATER. DO NOT LIE DOWN FOR THE  NEXT 30 MIN.  3  . lidocaine-prilocaine (EMLA) cream Apply 1 application topically as needed. 30 g 1  . Multiple Vitamins-Minerals (CENTRUM SILVER PO) Take 1 tablet by mouth daily.    . pantoprazole (PROTONIX) 20 MG tablet Take 20 mg by mouth at bedtime.     . traMADol-acetaminophen (ULTRACET) 37.5-325 MG tablet Take 1 tablet by mouth every 8 (eight) hours as needed.      No  current facility-administered medications for this visit.    Facility-Administered Medications Ordered in Other Visits  Medication Dose Route Frequency Provider Last Rate Last Dose  . heparin lock flush 100 unit/mL  500 Units Intravenous Once Lloyd Huger, MD      . heparin lock flush 100 unit/mL  500 Units Intracatheter PRN Lloyd Huger, MD      . sodium chloride flush (NS) 0.9 % injection 10 mL  10 mL Intravenous PRN Lloyd Huger, MD   10 mL at 12/04/17 0901  . sodium chloride flush (NS) 0.9 % injection 10 mL  10 mL Intracatheter PRN Lloyd Huger, MD      . yttrium-90 injection 62.9 millicurie  47.6 millicurie Intravenous Once [name], MD        OBJECTIVE: Vitals:   12/24/18 1039  Pulse: 80  Temp: (!) 96.9 F (36.1 C)     Body mass index is 23.37 kg/m.    ECOG FS:0 - Asymptomatic  General: Well-developed, well-nourished, no acute distress. Eyes: Pink conjunctiva, anicteric sclera. HEENT: Normocephalic, moist mucous membranes.  No palpable lymphadenopathy. Lungs: Clear to auscultation bilaterally. Heart: Regular rate and rhythm. No rubs, murmurs, or gallops. Abdomen: Soft, nontender, nondistended. No organomegaly noted, normoactive bowel sounds. Musculoskeletal: No edema, cyanosis, or clubbing. Neuro: Alert, answering all questions appropriately. Cranial nerves grossly intact. Skin: No rashes or petechiae noted. Psych: Normal affect. Lymphatics: No cervical, calvicular, axillary or inguinal LAD.  LAB RESULTS:  Lab Results  Component Value Date   NA 136 12/22/2018   K 3.9 12/22/2018   CL 104 12/22/2018   CO2 23 12/22/2018   GLUCOSE 96 12/22/2018   BUN 13 12/22/2018   CREATININE 0.71 12/22/2018   CALCIUM 8.9 12/22/2018   PROT 6.7 12/22/2018   ALBUMIN 4.2 12/22/2018   AST 22 12/22/2018   ALT 17 12/22/2018   ALKPHOS 69 12/22/2018   BILITOT 0.6 12/22/2018   GFRNONAA >60 12/22/2018   GFRAA >60 12/22/2018    Lab Results  Component  Value Date   WBC 5.4 12/22/2018   NEUTROABS 2.9 12/22/2018   HGB 13.2 12/22/2018   HCT 39.7 12/22/2018   MCV 94.3 12/22/2018   PLT 167 12/22/2018     STUDIES: Ct Soft Tissue Neck W Contrast  Result Date: 12/22/2018 CLINICAL DATA:  Restaging of follicular lymphoma initially diagnosed 10/17 EXAM: CT NECK WITH CONTRAST TECHNIQUE: Multidetector CT imaging of the neck was performed using the standard protocol following the bolus administration of intravenous contrast. CONTRAST:  1m ISOVUE-300 IOPAMIDOL (ISOVUE-300) INJECTION 61% COMPARISON:  CT neck 06/26/2018 FINDINGS: Pharynx and larynx: No focal mucosal or submucosal lesions are present. Nasopharynx is clear. Soft palate is within normal limits. The tongue base and oropharynx are clear. Epiglottis is normal. Hypopharynx is normal. Vocal cords are midline and symmetric. The trachea is unremarkable. Salivary glands: The submandibular and parotid glands are normal bilaterally. Thyroid: Normal. Lymph nodes: No significant cervical adenopathy is present. There is no evidence for residual recurrent lymphoma. Vascular: Atherosclerotic calcifications are present at the aortic arch and  origins the great vessels without focal stenosis. There is no aneurysm. Additional calcifications are present at the carotid bifurcations bilaterally without definite stenosis. Limited intracranial: Within normal limits. Visualized orbits: Bilateral lens replacements are noted. Globes and orbits are otherwise unremarkable. Mastoids and visualized paranasal sinuses: The paranasal sinuses and mastoid air cells are clear. Skeleton: Is straightening of the normal cervical lordosis. Grade 1 anterolisthesis is again noted at C2-3 and C3-4. There is chronic loss of disc height at C4-5, C5-6, and C6-7. Marrow signal and vertebral body heights are normal. Upper chest: There is some scarring at the right lung apex. No focal nodule mass, or airspace disease is present otherwise. Port-A-Cath  is noted. IMPRESSION: 1. No evidence for residual recurrent for disease in the neck. 2.  Aortic Atherosclerosis (ICD10-I70.0). 3. Bilateral carotid artery disease without definite stenosis. 4. Degenerative changes of the cervical spine as described. Electronically Signed   By: San Morelle M.D.   On: 12/22/2018 13:37   Ct Chest W Contrast  Result Date: 12/22/2018 CLINICAL DATA:  Restaging follicular lymphoma. EXAM: CT CHEST, ABDOMEN, AND PELVIS WITH CONTRAST TECHNIQUE: Multidetector CT imaging of the chest, abdomen and pelvis was performed following the standard protocol during bolus administration of intravenous contrast. CONTRAST:  23m ISOVUE-300 IOPAMIDOL (ISOVUE-300) INJECTION 61% COMPARISON:  06/26/2018 FINDINGS: CT CHEST FINDINGS Cardiovascular: The heart size is normal. No substantial pericardial effusion. Coronary artery calcification is evident. Atherosclerotic calcification is noted in the wall of the thoracic aorta. Right Port-A-Cath tip is positioned in the distal SVC near the junction with the RA. Mediastinum/Nodes: No mediastinal lymphadenopathy. There is no hilar lymphadenopathy. Rim calcified right axillary lesion described previously shows further interval decrease, measuring 2.2 x 1.9 cm today compared to 3.0 x 2.4 cm previously. No left axillary abnormality. Lungs/Pleura: The central tracheobronchial airways are patent. Subpleural reticulation in the right upper lobe is stable and likely represents radiation fibrosis. No suspicious pulmonary nodule or mass. No focal airspace consolidation. No pleural effusion. Stable appearance of minimal scarring in the lingula. Musculoskeletal: No worrisome lytic or sclerotic osseous abnormality. CT ABDOMEN PELVIS FINDINGS Hepatobiliary: No suspicious focal abnormality within the liver parenchyma. There is no evidence for gallstones, gallbladder wall thickening, or pericholecystic fluid. No intrahepatic or extrahepatic biliary dilation. Pancreas: No  focal mass lesion. No dilatation of the main duct. No intraparenchymal cyst. No peripancreatic edema. Spleen: No splenomegaly. No focal mass lesion. Adrenals/Urinary Tract: No adrenal nodule or mass. Kidneys unremarkable. No evidence for hydroureter. Bladder largely obscured by streak artifact from left hip replacement. Stomach/Bowel: Stomach is unremarkable. No gastric wall thickening. No evidence of outlet obstruction. Duodenum is normally positioned as is the ligament of Treitz. No small bowel wall thickening. No small bowel dilatation. The terminal ileum is normal. The appendix is not visualized, but there is no edema or inflammation in the region of the cecum. No gross colonic mass. No colonic wall thickening. Diverticular changes are noted in the left colon without evidence of diverticulitis. Vascular/Lymphatic: There is abdominal aortic atherosclerosis without aneurysm. Small lymph nodes in the hepato duodenal ligament are stable. No retroperitoneal lymphadenopathy. No pelvic sidewall lymphadenopathy although portions of the left pelvic sidewall ower obscured by beam hardening artifact from left hip replacement. Reproductive: Endometrial stripe may measure as thick as 11 mm. There is no adnexal mass. Other: No intraperitoneal free fluid. Musculoskeletal: Status post left total hip replacement. Stable appearance of the 1.7 x 2.4 cm lesion adjacent to the left acetabular component, also unchanged comparing back to PET-CT of  08/01/2016. IMPRESSION: 1. Continued further decrease in size of the right axillary lesion. 2. No lymphadenopathy in the neck, chest, abdomen, or pelvis. 3. Persistent endometrial thickening measuring up to 11 mm. This is abnormal endometrial stripe thickness for postmenopausal female. Pelvic ultrasound recommended to further evaluate. 4. Stable appearance of the left acetabular lesion comparing back to PET-CT of 08/01/2016. 5.  Aortic Atherosclerois (ICD10-170.0) Electronically Signed   By:  Misty Stanley M.D.   On: 12/22/2018 14:05   Ct Abdomen Pelvis W Contrast  Result Date: 12/22/2018 CLINICAL DATA:  Restaging follicular lymphoma. EXAM: CT CHEST, ABDOMEN, AND PELVIS WITH CONTRAST TECHNIQUE: Multidetector CT imaging of the chest, abdomen and pelvis was performed following the standard protocol during bolus administration of intravenous contrast. CONTRAST:  33m ISOVUE-300 IOPAMIDOL (ISOVUE-300) INJECTION 61% COMPARISON:  06/26/2018 FINDINGS: CT CHEST FINDINGS Cardiovascular: The heart size is normal. No substantial pericardial effusion. Coronary artery calcification is evident. Atherosclerotic calcification is noted in the wall of the thoracic aorta. Right Port-A-Cath tip is positioned in the distal SVC near the junction with the RA. Mediastinum/Nodes: No mediastinal lymphadenopathy. There is no hilar lymphadenopathy. Rim calcified right axillary lesion described previously shows further interval decrease, measuring 2.2 x 1.9 cm today compared to 3.0 x 2.4 cm previously. No left axillary abnormality. Lungs/Pleura: The central tracheobronchial airways are patent. Subpleural reticulation in the right upper lobe is stable and likely represents radiation fibrosis. No suspicious pulmonary nodule or mass. No focal airspace consolidation. No pleural effusion. Stable appearance of minimal scarring in the lingula. Musculoskeletal: No worrisome lytic or sclerotic osseous abnormality. CT ABDOMEN PELVIS FINDINGS Hepatobiliary: No suspicious focal abnormality within the liver parenchyma. There is no evidence for gallstones, gallbladder wall thickening, or pericholecystic fluid. No intrahepatic or extrahepatic biliary dilation. Pancreas: No focal mass lesion. No dilatation of the main duct. No intraparenchymal cyst. No peripancreatic edema. Spleen: No splenomegaly. No focal mass lesion. Adrenals/Urinary Tract: No adrenal nodule or mass. Kidneys unremarkable. No evidence for hydroureter. Bladder largely obscured  by streak artifact from left hip replacement. Stomach/Bowel: Stomach is unremarkable. No gastric wall thickening. No evidence of outlet obstruction. Duodenum is normally positioned as is the ligament of Treitz. No small bowel wall thickening. No small bowel dilatation. The terminal ileum is normal. The appendix is not visualized, but there is no edema or inflammation in the region of the cecum. No gross colonic mass. No colonic wall thickening. Diverticular changes are noted in the left colon without evidence of diverticulitis. Vascular/Lymphatic: There is abdominal aortic atherosclerosis without aneurysm. Small lymph nodes in the hepato duodenal ligament are stable. No retroperitoneal lymphadenopathy. No pelvic sidewall lymphadenopathy although portions of the left pelvic sidewall ower obscured by beam hardening artifact from left hip replacement. Reproductive: Endometrial stripe may measure as thick as 11 mm. There is no adnexal mass. Other: No intraperitoneal free fluid. Musculoskeletal: Status post left total hip replacement. Stable appearance of the 1.7 x 2.4 cm lesion adjacent to the left acetabular component, also unchanged comparing back to PET-CT of 08/01/2016. IMPRESSION: 1. Continued further decrease in size of the right axillary lesion. 2. No lymphadenopathy in the neck, chest, abdomen, or pelvis. 3. Persistent endometrial thickening measuring up to 11 mm. This is abnormal endometrial stripe thickness for postmenopausal female. Pelvic ultrasound recommended to further evaluate. 4. Stable appearance of the left acetabular lesion comparing back to PET-CT of 08/01/2016. 5.  Aortic Atherosclerois (ICD10-170.0) Electronically Signed   By: EMisty StanleyM.D.   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  PLAN:    1. Recurrent follicular lymphoma: CT scan results from December 22, 2018 reviewed independently and report as above with no evidence of  recurrent or progressive disease.  Patient received Zevalin on July 30, 2017 with not much therapeutic effect.  She subsequently underwent cycles 5 of R-CHOP chemotherapy with Neulasta support completing on December 12, 2017.  No further interventions or treatment are needed.  Previously, patient declined maintenance Rituxan.  Return to clinic in 3 months with repeat laboratory work and further evaluation.  Will reimage in August 2020. 2.  Neck pain: Musculoskeletal in nature.  Continue symptomatic treatment.    Patient expressed understanding and was in agreement with this plan. She also understands that She can call clinic at any time with any questions, concerns, or complaints.   Cancer Staging Follicular lymphoma ([place]) Staging form: Lymphoid Neoplasms, AJCC 6th Edition - Clinical stage from 08/27/2016: Stage IIE (lymphoma only) - Signed by Lloyd Huger, MD on 08/27/2016   Lloyd Huger, MD   12/24/2018 3:14 PM

## 2018-12-22 ENCOUNTER — Ambulatory Visit
Admission: RE | Admit: 2018-12-22 | Discharge: 2018-12-22 | Disposition: A | Discharge status: Home / Self Care | Payer: Medicare Other | Source: Ambulatory Visit | Attending: Oncology | Admitting: Oncology

## 2018-12-22 ENCOUNTER — Inpatient Hospital Stay: Payer: Medicare Other | Attending: Oncology

## 2018-12-22 ENCOUNTER — Ambulatory Visit: Admission: RE | Admit: 2018-12-22 | Payer: Medicare Other | Source: Ambulatory Visit

## 2018-12-22 DIAGNOSIS — C8208 Follicular lymphoma grade I, lymph nodes of multiple sites: Secondary | ICD-10-CM

## 2018-12-22 LAB — COMPREHENSIVE METABOLIC PANEL
ALBUMIN: 4.2 g/dL (ref 3.5–5.0)
ALT: 17 U/L (ref 0–44)
ANION GAP: 9 (ref 5–15)
AST: 22 U/L (ref 15–41)
Alkaline Phosphatase: 69 U/L (ref 38–126)
BUN: 13 mg/dL (ref 8–23)
CO2: 23 mmol/L (ref 22–32)
Calcium: 8.9 mg/dL (ref 8.9–10.3)
Chloride: 104 mmol/L (ref 98–111)
Creatinine, Ser: 0.71 mg/dL (ref 0.44–1.00)
GFR calc Af Amer: >60 mL/min (ref >60)
GFR calc non Af Amer: >60 mL/min (ref >60)
GLUCOSE: 96 mg/dL (ref 70–99)
POTASSIUM: 3.9 mmol/L (ref 3.5–5.1)
SODIUM: 136 mmol/L (ref 135–145)
Total Bilirubin: 0.6 mg/dL (ref 0.3–1.2)
Total Protein: 6.7 g/dL (ref 6.5–8.1)

## 2018-12-22 LAB — CBC WITH DIFFERENTIAL/PLATELET
ABS IMMATURE GRANULOCYTES: 0.01 10*3/uL (ref 0.00–0.07)
BASOS PCT: 0 %
Basophils Absolute: 0 10*3/uL (ref 0.0–0.1)
Eosinophils Absolute: 0 10*3/uL (ref 0.0–0.5)
Eosinophils Relative: 1 %
HCT: 39.7 % (ref 36.0–46.0)
Hemoglobin: 13.2 g/dL (ref 12.0–15.0)
IMMATURE GRANULOCYTES: 0 %
LYMPHS ABS: 1.7 10*3/uL (ref 0.7–4.0)
Lymphocytes Relative: 32 %
MCH: 31.4 pg (ref 26.0–34.0)
MCHC: 33.2 g/dL (ref 30.0–36.0)
MCV: 94.3 fL (ref 80.0–100.0)
Monocytes Absolute: 0.7 10*3/uL (ref 0.1–1.0)
Monocytes Relative: 13 %
NEUTROS ABS: 2.9 10*3/uL (ref 1.7–7.7)
NEUTROS PCT: 54 %
PLATELETS: 167 10*3/uL (ref 150–400)
RBC: 4.21 MIL/uL (ref 3.87–5.11)
RDW: 13.2 % (ref 11.5–15.5)
WBC: 5.4 10*3/uL (ref 4.0–10.5)
nRBC: 0 % (ref 0.0–0.2)

## 2018-12-22 MED ORDER — HEPARIN SOD (PORK) LOCK FLUSH 100 UNIT/ML IV SOLN
500.0000 [IU] | Freq: Once | INTRAVENOUS | Status: AC
  Administered 2018-12-22: 500 [IU] via INTRAVENOUS
  Filled 2018-12-22: qty 5

## 2018-12-22 MED ORDER — SODIUM CHLORIDE 0.9% FLUSH
10.0000 mL | INTRAVENOUS | Status: DC | PRN
  Administered 2018-12-22: 10 mL via INTRAVENOUS
  Filled 2018-12-22: qty 10

## 2018-12-22 MED ORDER — IOPAMIDOL (ISOVUE-300) INJECTION 61%
75.0000 mL | Freq: Once | INTRAVENOUS | Status: AC | PRN
  Administered 2018-12-22: 75 mL via INTRAVENOUS

## 2018-12-24 ENCOUNTER — Other Ambulatory Visit: Payer: Self-pay

## 2018-12-24 ENCOUNTER — Inpatient Hospital Stay: Payer: Medicare Other | Attending: Oncology | Admitting: Oncology

## 2018-12-24 VITALS — HR 80 | Temp 96.9°F | Ht 59.0 in | Wt 115.7 lb

## 2018-12-24 DIAGNOSIS — M542 Cervicalgia: Secondary | ICD-10-CM | POA: Diagnosis not present

## 2018-12-24 DIAGNOSIS — C8208 Follicular lymphoma grade I, lymph nodes of multiple sites: Secondary | ICD-10-CM

## 2018-12-24 NOTE — Progress Notes (Signed)
Patient is here today to follow grade 1 follicular lymphoma of lymph nodes of multiple region. Patient state dthat she started to have neck pain for about 2 weeks and not sure what to do. Patient stated that otherwise, she had been doing well.

## 2019-02-10 ENCOUNTER — Other Ambulatory Visit: Payer: Self-pay

## 2019-02-11 ENCOUNTER — Inpatient Hospital Stay: Payer: Medicare Other

## 2019-02-25 ENCOUNTER — Other Ambulatory Visit: Payer: Self-pay

## 2019-02-26 ENCOUNTER — Other Ambulatory Visit: Payer: Self-pay

## 2019-02-26 ENCOUNTER — Inpatient Hospital Stay: Payer: Medicare Other | Attending: Oncology

## 2019-02-26 DIAGNOSIS — Z95828 Presence of other vascular implants and grafts: Secondary | ICD-10-CM

## 2019-02-26 DIAGNOSIS — C8208 Follicular lymphoma grade I, lymph nodes of multiple sites: Secondary | ICD-10-CM | POA: Diagnosis present

## 2019-02-26 DIAGNOSIS — Z452 Encounter for adjustment and management of vascular access device: Secondary | ICD-10-CM | POA: Insufficient documentation

## 2019-02-26 MED ORDER — SODIUM CHLORIDE 0.9% FLUSH
10.0000 mL | Freq: Once | INTRAVENOUS | Status: AC
  Administered 2019-02-26: 10 mL via INTRAVENOUS
  Filled 2019-02-26: qty 10

## 2019-02-26 MED ORDER — HEPARIN SOD (PORK) LOCK FLUSH 100 UNIT/ML IV SOLN
500.0000 [IU] | Freq: Once | INTRAVENOUS | Status: AC
  Administered 2019-02-26: 500 [IU] via INTRAVENOUS

## 2019-03-21 NOTE — Progress Notes (Signed)
[place]  Telephone:(336) [phone] Fax:(336) [phone]  ID: Veronica Boyd OB: 07/16/1946  MR#: [id number]  BWG#:[id number]  Patient Care Team: Glendon Axe, MD as PCP - General (Internal Medicine) Lloyd Huger, MD as Consulting Physician (Oncology) Leonie Green, MD as Referring Physician (Surgery) [name], MD as Referring Physician (Radiation Oncology) Jacquelin Hawking, NP as Nurse Practitioner (Oncology)  I connected with Veronica Boyd on 03/26/19 at  9:00 AM EDT by video enabled telemedicine visit and verified that I am speaking with the correct person using two identifiers.   I discussed the limitations, risks, security and privacy concerns of performing an evaluation and management service by telemedicine and the availability of in-person appointments. I also discussed with the patient that there may be a patient responsible charge related to this service. The patient expressed understanding and agreed to proceed.   Other persons participating in the visit and their role in the encounter: Patient, patient's son, MD.  Patient's location: Home Provider's location: Clinic  CHIEF COMPLAINT: Follicular lymphoma  INTERVAL HISTORY: Patient agreed to video enabled telemedicine visit to discuss her laboratory work and routine evaluation.  She currently feels well and is asymptomatic. She has noticed no new lymphadenopathy.  She has no neurologic complaints.  She has a good appetite and her weight is stable.  She denies any fevers, night sweats, or weight loss.  She denies any chest pain, shortness of breath, cough, or hemoptysis.  She denies any nausea, vomiting, constipation, or diarrhea. She has no urinary complaints.  Patient feels at her baseline offers no specific complaints today.  REVIEW OF SYSTEMS:   Review of Systems  Constitutional: Negative.  Negative for fever, malaise/fatigue and weight loss.  Respiratory: Negative.  Negative  for cough, hemoptysis and shortness of breath.   Cardiovascular: Negative.  Negative for chest pain and leg swelling.  Gastrointestinal: Negative.  Negative for abdominal pain, blood in stool, melena, nausea and vomiting.  Genitourinary: Negative.  Negative for dysuria.  Musculoskeletal: Negative.  Negative for back pain, joint pain and neck pain.  Skin: Negative.  Negative for rash.  Neurological: Negative.  Negative for sensory change, focal weakness, weakness and headaches.  Psychiatric/Behavioral: Negative.  The patient is not nervous/anxious and does not have insomnia.    As per HPI. Otherwise, a complete review of systems is negative.   PAST MEDICAL HISTORY: Past Medical History:  Diagnosis Date  . Arthritis   . Cataract    bilateral  . Chicken pox   . Colon polyp   . Follicular lymphoma ([place]) 08/2016   lymph nodes   . Hyperlipidemia   . Osteoporosis     PAST SURGICAL HISTORY: Past Surgical History:  Procedure Laterality Date  . AXILLARY LYMPH NODE DISSECTION Right 08/21/2016   Procedure: AXILLARY LYMPH NODE excision;  Surgeon: Leonie Green, MD;  Location: [facility];  Service: General;  Laterality: Right;  . CATARACT EXTRACTION, BILATERAL Bilateral   . JOINT REPLACEMENT Left   . PORTA CATH INSERTION N/A 09/16/2017   Procedure: PORTA CATH INSERTION;  Surgeon: Algernon Huxley, MD;  Location: [facility];  Service: Cardiovascular;  Laterality: N/A;  . TONSILLECTOMY    . TOTAL HIP ARTHROPLASTY Left 1992    FAMILY HISTORY: Family History  Problem Relation Age of Onset  . Diabetes Sister   . Lung cancer Brother   . Diabetes Brother   . Basal cell carcinoma Daughter   . Breast cancer Paternal Aunt  ADVANCED DIRECTIVES (Y/N):  N  HEALTH MAINTENANCE: Social History   Tobacco Use  . Smoking status: Never Smoker  . Smokeless tobacco: Never Used  Substance Use Topics  . Alcohol use: No  . Drug use: No     Colonoscopy:  PAP:  Bone density:   Lipid panel:  No Known Allergies  Current Outpatient Medications  Medication Sig Dispense Refill  . alendronate (FOSAMAX) 70 MG tablet TAKE 1 TABLET EVERY 7 DAYS TAKE WITH A FULL GLASS OF WATER. DO NOT LIE DOWN FOR THE NEXT 30 MIN.  3  . lidocaine-prilocaine (EMLA) cream Apply 1 application topically as needed. 30 g 1  . Multiple Vitamins-Minerals (CENTRUM SILVER PO) Take 1 tablet by mouth daily.    . pantoprazole (PROTONIX) 20 MG tablet Take 20 mg by mouth at bedtime.     . traMADol-acetaminophen (ULTRACET) 37.5-325 MG tablet Take 1 tablet by mouth every 8 (eight) hours as needed.      No current facility-administered medications for this visit.    Facility-Administered Medications Ordered in Other Visits  Medication Dose Route Frequency Provider Last Rate Last Dose  . heparin lock flush 100 unit/mL  500 Units Intravenous Once Lloyd Huger, MD      . heparin lock flush 100 unit/mL  500 Units Intracatheter PRN Lloyd Huger, MD      . sodium chloride flush (NS) 0.9 % injection 10 mL  10 mL Intravenous PRN Lloyd Huger, MD   10 mL at 12/04/17 0901  . sodium chloride flush (NS) 0.9 % injection 10 mL  10 mL Intracatheter PRN Lloyd Huger, MD      . yttrium-90 injection 59.5 millicurie  63.8 millicurie Intravenous Once [name], MD        OBJECTIVE: There were no vitals filed for this visit.   There is no height or weight on file to calculate BMI.    ECOG FS:0 - Asymptomatic  General: Well-developed, well-nourished, no acute distress. HEENT: Normocephalic.  No visible lymphadenopathy. Neuro: Alert, answering all questions appropriately. Cranial nerves grossly intact. Skin: No rashes or petechiae noted. Psych: Normal affect.  LAB RESULTS:  Lab Results  Component Value Date   NA 138 03/23/2019   K 4.0 03/23/2019   CL 104 03/23/2019   CO2 28 03/23/2019   GLUCOSE 95 03/23/2019   BUN 13 03/23/2019   CREATININE 0.65 03/23/2019   CALCIUM 8.8 (L)  03/23/2019   PROT 6.6 03/23/2019   ALBUMIN 4.2 03/23/2019   AST 23 03/23/2019   ALT 20 03/23/2019   ALKPHOS 55 03/23/2019   BILITOT 0.8 03/23/2019   GFRNONAA >60 03/23/2019   GFRAA >60 03/23/2019    Lab Results  Component Value Date   WBC 6.8 03/23/2019   NEUTROABS 2.6 03/23/2019   HGB 13.2 03/23/2019   HCT 39.7 03/23/2019   MCV 95.2 03/23/2019   PLT 163 03/23/2019     STUDIES: No results found.   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  PLAN:    1. Recurrent follicular lymphoma: CT scan results from December 22, 2018 reviewed independently with no evidence of recurrent or progressive disease.  Patient received Zevalin on July 30, 2017 with not much therapeutic effect.  She subsequently underwent cycles 5 of R-CHOP chemotherapy with Neulasta support completing on December 12, 2017.  No further interventions or treatment are needed at this time.  Previously, patient declined maintenance Rituxan.  Return to clinic in 3 months with repeat laboratory work, imaging,  and further evaluation. 2.  Neck pain: Patient does not complain of this today.  I provided 15 minutes of face-to-face video visit time during this encounter, and > 50% was spent counseling as documented under my assessment & plan.   Patient expressed understanding and was in agreement with this plan. She also understands that She can call clinic at any time with any questions, concerns, or complaints.   Cancer Staging Follicular lymphoma ([place]) Staging form: Lymphoid Neoplasms, AJCC 6th Edition - Clinical stage from 08/27/2016: Stage IIE (lymphoma only) - Signed by Lloyd Huger, MD on 08/27/2016   Lloyd Huger, MD   03/26/2019 6:53 AM

## 2019-03-22 ENCOUNTER — Other Ambulatory Visit: Payer: Self-pay

## 2019-03-23 ENCOUNTER — Inpatient Hospital Stay: Payer: Medicare Other | Admitting: Oncology

## 2019-03-23 ENCOUNTER — Other Ambulatory Visit: Payer: Self-pay

## 2019-03-23 ENCOUNTER — Inpatient Hospital Stay: Payer: Medicare Other | Admitting: *Deleted

## 2019-03-23 DIAGNOSIS — C8208 Follicular lymphoma grade I, lymph nodes of multiple sites: Secondary | ICD-10-CM | POA: Diagnosis present

## 2019-03-23 LAB — CBC WITH DIFFERENTIAL/PLATELET
Abs Immature Granulocytes: 0.01 10*3/uL (ref 0.00–0.07)
Basophils Absolute: 0 10*3/uL (ref 0.0–0.1)
Basophils Relative: 0 %
Eosinophils Absolute: 0.1 10*3/uL (ref 0.0–0.5)
Eosinophils Relative: 1 %
HCT: 39.7 % (ref 36.0–46.0)
Hemoglobin: 13.2 g/dL (ref 12.0–15.0)
Immature Granulocytes: 0 %
Lymphocytes Relative: 47 %
Lymphs Abs: 3.2 10*3/uL (ref 0.7–4.0)
MCH: 31.7 pg (ref 26.0–34.0)
MCHC: 33.2 g/dL (ref 30.0–36.0)
MCV: 95.2 fL (ref 80.0–100.0)
Monocytes Absolute: 0.9 10*3/uL (ref 0.1–1.0)
Monocytes Relative: 13 %
Neutro Abs: 2.6 10*3/uL (ref 1.7–7.7)
Neutrophils Relative %: 39 %
Platelets: 163 10*3/uL (ref 150–400)
RBC: 4.17 MIL/uL (ref 3.87–5.11)
RDW: 13.5 % (ref 11.5–15.5)
WBC: 6.8 10*3/uL (ref 4.0–10.5)
nRBC: 0 % (ref 0.0–0.2)

## 2019-03-23 LAB — COMPREHENSIVE METABOLIC PANEL
ALT: 20 U/L (ref 0–44)
AST: 23 U/L (ref 15–41)
Albumin: 4.2 g/dL (ref 3.5–5.0)
Alkaline Phosphatase: 55 U/L (ref 38–126)
Anion gap: 6 (ref 5–15)
BUN: 13 mg/dL (ref 8–23)
CO2: 28 mmol/L (ref 22–32)
Calcium: 8.8 mg/dL — ABNORMAL LOW (ref 8.9–10.3)
Chloride: 104 mmol/L (ref 98–111)
Creatinine, Ser: 0.65 mg/dL (ref 0.44–1.00)
GFR calc Af Amer: >60 mL/min (ref >60)
GFR calc non Af Amer: >60 mL/min (ref >60)
Glucose, Bld: 95 mg/dL (ref 70–99)
Potassium: 4 mmol/L (ref 3.5–5.1)
Sodium: 138 mmol/L (ref 135–145)
Total Bilirubin: 0.8 mg/dL (ref 0.3–1.2)
Total Protein: 6.6 g/dL (ref 6.5–8.1)

## 2019-03-24 ENCOUNTER — Inpatient Hospital Stay: Payer: Medicare Other | Attending: Oncology | Admitting: Oncology

## 2019-03-24 ENCOUNTER — Encounter: Payer: Self-pay | Admitting: Oncology

## 2019-03-24 DIAGNOSIS — C8208 Follicular lymphoma grade I, lymph nodes of multiple sites: Secondary | ICD-10-CM

## 2019-03-24 NOTE — Progress Notes (Signed)
Patient denies any concerns today.  

## 2019-06-19 ENCOUNTER — Other Ambulatory Visit: Payer: Self-pay

## 2019-06-19 NOTE — Progress Notes (Signed)
[place]  Telephone:(336) [phone] Fax:(336) [phone]  ID: Veronica Boyd OB: October 18, 1946  MR#: [id number]  IRW#:[id number]  Patient Care Team: Glendon Axe, MD as PCP - General (Internal Medicine) Lloyd Huger, MD as Consulting Physician (Oncology) Leonie Green, MD as Referring Physician (Surgery) [name], MD as Referring Physician (Radiation Oncology) Jacquelin Hawking, NP as Nurse Practitioner (Oncology)   CHIEF COMPLAINT: Follicular lymphoma  INTERVAL HISTORY: Patient returns to clinic today for repeat laboratory work, further evaluation, and discussion of her imaging results.  She continues to feel well and remains asymptomatic. She has noticed no new lymphadenopathy.  She has no neurologic complaints.  She has a good appetite and her weight is stable.  She denies any fevers, night sweats, or weight loss.  She denies any chest pain, shortness of breath, cough, or hemoptysis.  She denies any nausea, vomiting, constipation, or diarrhea. She has no urinary complaints.  Patient feels that her baseline offers no specific implants today.  REVIEW OF SYSTEMS:   Review of Systems  Constitutional: Negative.  Negative for fever, malaise/fatigue and weight loss.  Respiratory: Negative.  Negative for cough, hemoptysis and shortness of breath.   Cardiovascular: Negative.  Negative for chest pain and leg swelling.  Gastrointestinal: Negative.  Negative for abdominal pain, blood in stool, melena, nausea and vomiting.  Genitourinary: Negative.  Negative for dysuria.  Musculoskeletal: Negative.  Negative for back pain, joint pain and neck pain.  Skin: Negative.  Negative for rash.  Neurological: Negative.  Negative for sensory change, focal weakness, weakness and headaches.  Psychiatric/Behavioral: Negative.  The patient is not nervous/anxious and does not have insomnia.    As per HPI. Otherwise, a complete review of systems is negative.   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   lymph nodes    Hyperlipidemia    Osteoporosis     PAST SURGICAL HISTORY: Past Surgical History:  Procedure Laterality Date   AXILLARY LYMPH NODE DISSECTION Right 08/21/2016   Procedure: AXILLARY LYMPH NODE excision;  Surgeon: Leonie Green, MD;  Location: [facility];  Service: General;  Laterality: Right;   CATARACT EXTRACTION, BILATERAL Bilateral    JOINT REPLACEMENT Left    PORTA CATH INSERTION N/A 09/16/2017   Procedure: PORTA CATH INSERTION;  Surgeon: Algernon Huxley, MD;  Location: [place] [facility];  Service: Cardiovascular;  Laterality: N/A;   TONSILLECTOMY     TOTAL HIP ARTHROPLASTY Left 1992    FAMILY HISTORY: Family History  Problem Relation Age of Onset   Diabetes Sister    Lung cancer Brother    Diabetes Brother    Basal cell carcinoma Daughter    Breast cancer Paternal Aunt     ADVANCED DIRECTIVES (Y/N):  N  HEALTH MAINTENANCE: Social History   Tobacco Use   Smoking status: Never Smoker   Smokeless tobacco: Never Used  Substance Use Topics   Alcohol use: No   Drug use: No     Colonoscopy:  PAP:  Bone density:  Lipid panel:  No Known Allergies  Current Outpatient Medications  Medication Sig Dispense Refill   alendronate (FOSAMAX) 70 MG tablet TAKE 1 TABLET EVERY 7 DAYS TAKE WITH A FULL GLASS OF WATER. DO NOT LIE DOWN FOR THE NEXT 30 MIN.  3   diclofenac sodium (VOLTAREN) 1 %  GEL APPLY 2 GRAMS TOPICALLY 2 (TWO) TIMES DAILY     lidocaine-prilocaine (EMLA) cream Apply 1 application topically as needed. 30 g 1   meloxicam (MOBIC) 7.5 MG tablet Take by mouth.     Multiple Vitamins-Minerals (CENTRUM SILVER PO) Take 1 tablet by mouth daily.     pantoprazole (PROTONIX) 20 MG tablet Take 20 mg by mouth at bedtime.      simvastatin (ZOCOR) 20 MG tablet TAKE 1  TABLET BY MOUTH EVERY DAY AT NIGHT     tiZANidine (ZANAFLEX) 2 MG tablet Take by mouth.     traMADol-acetaminophen (ULTRACET) 37.5-325 MG tablet Take 1 tablet by mouth every 8 (eight) hours as needed.      No current facility-administered medications for this visit.    Facility-Administered Medications Ordered in Other Visits  Medication Dose Route Frequency Provider Last Rate Last Dose   heparin lock flush 100 unit/mL  500 Units Intravenous Once Lloyd Huger, MD       heparin lock flush 100 unit/mL  500 Units Intracatheter PRN Lloyd Huger, MD       sodium chloride flush (NS) 0.9 % injection 10 mL  10 mL Intravenous PRN Lloyd Huger, MD   10 mL at 12/04/17 0901   sodium chloride flush (NS) 0.9 % injection 10 mL  10 mL Intracatheter PRN Lloyd Huger, MD       yttrium-90 injection 51.7 millicurie  00.1 millicurie Intravenous Once Chrystal, Eulas Post, MD        OBJECTIVE: Vitals:   06/24/19 1011  BP: 135/81  Pulse: (!) 105  Resp: 18  Temp: 98.9 F (37.2 C)     Body mass index is 23.31 kg/m.    ECOG FS:0 - Asymptomatic  General: Well-developed, well-nourished, no acute distress. Eyes: Pink conjunctiva, anicteric sclera. HEENT: Normocephalic, moist mucous membranes. Lungs: Clear to auscultation bilaterally. Heart: Regular rate and rhythm. No rubs, murmurs, or gallops. Abdomen: Soft, nontender, nondistended. No organomegaly noted, normoactive bowel sounds. Musculoskeletal: No edema, cyanosis, or clubbing. Neuro: Alert, answering all questions appropriately. Cranial nerves grossly intact. Skin: No rashes or petechiae noted. Psych: Normal affect.  LAB RESULTS:  Lab Results  Component Value Date   NA 141 06/22/2019   K 4.0 06/22/2019   CL 106 06/22/2019   CO2 26 06/22/2019   GLUCOSE 95 06/22/2019   BUN 14 06/22/2019   CREATININE 0.66 06/22/2019   CALCIUM 9.2 06/22/2019   PROT 6.9 06/22/2019   ALBUMIN 4.3 06/22/2019   AST 25 06/22/2019   ALT  18 06/22/2019   ALKPHOS 52 06/22/2019   BILITOT 0.7 06/22/2019   GFRNONAA >60 06/22/2019   GFRAA >60 06/22/2019    Lab Results  Component Value Date   WBC 6.9 06/22/2019   NEUTROABS 3.7 06/22/2019   HGB 12.8 06/22/2019   HCT 38.4 06/22/2019   MCV 95.8 06/22/2019   PLT 185 06/22/2019     STUDIES: Ct Soft Tissue Neck W Contrast  Result Date: 06/22/2019 CLINICAL DATA:  Restaging grade 1 follicular lymphoma diagnosed 08/24/2016 EXAM: CT NECK WITH CONTRAST TECHNIQUE: Multidetector CT imaging of the neck was performed using the standard protocol following the bolus administration of intravenous contrast. CONTRAST:  3m OMNIPAQUE IOHEXOL 300 MG/ML  SOLN COMPARISON:  Neck CT 12/22/2018, same day chest CT 06/22/2019 FINDINGS: Pharynx and larynx: Streak artifact from dental restoration limits evaluation of the oral cavity. No appreciable mass or swelling. Salivary glands: Nonspecific punctate calcifications within the bilateral parotid glands. The bilateral parotid and  submandibular glands are otherwise unremarkable. Thyroid: Unremarkable Lymph nodes: No pathologically enlarged cervical chain lymph nodes. Vascular: Atherosclerotic calcification of the visualized aortic arch and major branch vessels. No appreciable high-grade stenosis within the common carotid, cervical internal carotid arteries or cervical vertebral arteries. Limited intracranial: Unremarkable Visualized orbits: The imaged globes and orbits are demonstrate no acute abnormality. Mastoids and visualized paranasal sinuses: No significant paranasal sinus disease or mastoid effusion. Skeleton: Cervical spondylosis. C2-C3 and C3-C4 grade 1 anterolisthesis. No high-grade bony spinal canal stenosis. No suspicious lytic or blastic osseous lesions. Upper chest: Partially imaged right chest infusion port catheter. Please refer to same day chest CT for a description of findings below the level of the thoracic inlet. IMPRESSION: No evidence of  residual/recurrent disease within the neck. Please refer to same day chest CT for a description of findings below the level of the thoracic inlet. Electronically Signed   By: Kellie Simmering   On: 06/22/2019 13:11   Ct Chest W Contrast  Result Date: 06/22/2019 CLINICAL DATA:  Follicular lymphoma diagnosed in 2017. EXAM: CT CHEST, ABDOMEN, AND PELVIS WITH CONTRAST TECHNIQUE: Multidetector CT imaging of the chest, abdomen and pelvis was performed following the standard protocol during bolus administration of intravenous contrast. CONTRAST:  60m OMNIPAQUE IOHEXOL 300 MG/ML  SOLN COMPARISON:  12/22/2018 FINDINGS: CT CHEST FINDINGS Cardiovascular: The heart size is normal. No substantial pericardial effusion. Atherosclerotic calcification is noted in the wall of the thoracic aorta. Coronary artery calcification is evident. Right Port-A-Cath tip is positioned at the SVC/RA junction. Mediastinum/Nodes: No mediastinal lymphadenopathy. There is no hilar lymphadenopathy. The esophagus has normal imaging features. No left axillary lymphadenopathy. The calcified right axillary soft tissue nodule continues to decrease in size, measuring 1.9 x 1.4 cm today compared to 2.2 x 1.9 cm previously. The esophagus has normal imaging features. Lungs/Pleura: Subpleural reticulation in the peripheral right upper lobe is stable. No new suspicious pulmonary nodule or mass. No focal airspace consolidation. No pleural effusion. Musculoskeletal: No worrisome lytic or sclerotic osseous abnormality. CT ABDOMEN PELVIS FINDINGS Hepatobiliary: No suspicious focal abnormality within the liver parenchyma. There is no evidence for gallstones, gallbladder wall thickening, or pericholecystic fluid. No intrahepatic or extrahepatic biliary dilation. Pancreas: No focal mass lesion. No dilatation of the main duct. No intraparenchymal cyst. No peripancreatic edema. Spleen: No splenomegaly. No focal mass lesion. Adrenals/Urinary Tract: No adrenal nodule or  mass. Kidneys unremarkable. No evidence for hydroureter. The urinary bladder appears normal for the degree of distention. Stomach/Bowel: Stomach is unremarkable. No gastric wall thickening. No evidence of outlet obstruction. Duodenum is normally positioned as is the ligament of Treitz. No small bowel wall thickening. No small bowel dilatation. The terminal ileum is normal. The appendix is normal. No gross colonic mass. No colonic wall thickening. Vascular/Lymphatic: There is abdominal aortic atherosclerosis without aneurysm. There is no gastrohepatic or hepatoduodenal ligament lymphadenopathy. No intraperitoneal or retroperitoneal lymphadenopathy. 7 mm short axis hepato duodenal ligament lymph node is upper normal for size. No pelvic sidewall lymphadenopathy. Reproductive: Endometrial stripe thickness remains prominent but measures 8 mm today compared to 11 mm previously. There is no adnexal mass. Other: Intraperitoneal free fluid noted on the previous study has resolved in the interval. Musculoskeletal: Status post left hip replacement with stable lucency in the roof of the acetabulum. No worrisome lytic or sclerotic osseous abnormality. IMPRESSION: 1. Calcified right axillary lesion shows continued further decrease in size, measuring 1.9 x 1.4 cm today. 2. No other lymphadenopathy in the chest, abdomen, or pelvis. 3. Similar  endometrial thickening at 8 mm today. 4. No change in the lucency identified at the left acetabular roof, adjacent to the hip replacement. 5.  Aortic Atherosclerois (ICD10-170.0) Electronically Signed   By: Misty Stanley M.D.   On: 06/22/2019 11:36   Ct Abdomen Pelvis W Contrast  Result Date: 06/22/2019 CLINICAL DATA:  Follicular lymphoma diagnosed in 2017. EXAM: CT CHEST, ABDOMEN, AND PELVIS WITH CONTRAST TECHNIQUE: Multidetector CT imaging of the chest, abdomen and pelvis was performed following the standard protocol during bolus administration of intravenous contrast. CONTRAST:  64m  OMNIPAQUE IOHEXOL 300 MG/ML  SOLN COMPARISON:  12/22/2018 FINDINGS: CT CHEST FINDINGS Cardiovascular: The heart size is normal. No substantial pericardial effusion. Atherosclerotic calcification is noted in the wall of the thoracic aorta. Coronary artery calcification is evident. Right Port-A-Cath tip is positioned at the SVC/RA junction. Mediastinum/Nodes: No mediastinal lymphadenopathy. There is no hilar lymphadenopathy. The esophagus has normal imaging features. No left axillary lymphadenopathy. The calcified right axillary soft tissue nodule continues to decrease in size, measuring 1.9 x 1.4 cm today compared to 2.2 x 1.9 cm previously. The esophagus has normal imaging features. Lungs/Pleura: Subpleural reticulation in the peripheral right upper lobe is stable. No new suspicious pulmonary nodule or mass. No focal airspace consolidation. No pleural effusion. Musculoskeletal: No worrisome lytic or sclerotic osseous abnormality. CT ABDOMEN PELVIS FINDINGS Hepatobiliary: No suspicious focal abnormality within the liver parenchyma. There is no evidence for gallstones, gallbladder wall thickening, or pericholecystic fluid. No intrahepatic or extrahepatic biliary dilation. Pancreas: No focal mass lesion. No dilatation of the main duct. No intraparenchymal cyst. No peripancreatic edema. Spleen: No splenomegaly. No focal mass lesion. Adrenals/Urinary Tract: No adrenal nodule or mass. Kidneys unremarkable. No evidence for hydroureter. The urinary bladder appears normal for the degree of distention. Stomach/Bowel: Stomach is unremarkable. No gastric wall thickening. No evidence of outlet obstruction. Duodenum is normally positioned as is the ligament of Treitz. No small bowel wall thickening. No small bowel dilatation. The terminal ileum is normal. The appendix is normal. No gross colonic mass. No colonic wall thickening. Vascular/Lymphatic: There is abdominal aortic atherosclerosis without aneurysm. There is no  gastrohepatic or hepatoduodenal ligament lymphadenopathy. No intraperitoneal or retroperitoneal lymphadenopathy. 7 mm short axis hepato duodenal ligament lymph node is upper normal for size. No pelvic sidewall lymphadenopathy. Reproductive: Endometrial stripe thickness remains prominent but measures 8 mm today compared to 11 mm previously. There is no adnexal mass. Other: Intraperitoneal free fluid noted on the previous study has resolved in the interval. Musculoskeletal: Status post left hip replacement with stable lucency in the roof of the acetabulum. No worrisome lytic or sclerotic osseous abnormality. IMPRESSION: 1. Calcified right axillary lesion shows continued further decrease in size, measuring 1.9 x 1.4 cm today. 2. No other lymphadenopathy in the chest, abdomen, or pelvis. 3. Similar endometrial thickening at 8 mm today. 4. No change in the lucency identified at the left acetabular roof, adjacent to the hip replacement. 5.  Aortic Atherosclerois (ICD10-170.0) Electronically Signed   By: EMisty StanleyM.D.   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  PLAN:    1. Recurrent follicular lymphoma: CT scan results from June 22, 2019 reviewed independently reported as above with no obvious evidence of recurrent or progressive disease.  Calcified right axillary lesion continues to decrease in size and is now 1.9 x 1.4 cm. Patient received Zevalin on July 30, 2017 with not much therapeutic effect.  She subsequently underwent cycles 5 of  R-CHOP chemotherapy with Neulasta support completing on December 12, 2017.  No further interventions or treatments are needed at this time.  Return to clinic in 3 months with laboratory work and video-assisted telemedicine visit.  Will repeat imaging in 6 months at which point patient can likely be transitioned to yearly imaging.  I spent a total of 20 minutes face-to-face with the patient of which greater than 50% of  the visit was spent in counseling and coordination of care as detailed above.   Patient expressed understanding and was in agreement with this plan. She also understands that She can call clinic at any time with any questions, concerns, or complaints.   Cancer Staging Follicular lymphoma ([place]) Staging form: Lymphoid Neoplasms, AJCC 6th Edition - Clinical stage from 08/27/2016: Stage IIE (lymphoma only) - Signed by Lloyd Huger, MD on 08/27/2016   Lloyd Huger, MD   06/24/2019 2:34 PM

## 2019-06-22 ENCOUNTER — Ambulatory Visit
Admission: RE | Admit: 2019-06-22 | Discharge: 2019-06-22 | Disposition: A | Discharge status: Home / Self Care | Payer: Medicare Other | Source: Ambulatory Visit | Attending: Oncology | Admitting: Oncology

## 2019-06-22 ENCOUNTER — Other Ambulatory Visit: Payer: Self-pay

## 2019-06-22 ENCOUNTER — Inpatient Hospital Stay: Payer: Medicare Other

## 2019-06-22 DIAGNOSIS — C8208 Follicular lymphoma grade I, lymph nodes of multiple sites: Secondary | ICD-10-CM

## 2019-06-22 LAB — COMPREHENSIVE METABOLIC PANEL
ALT: 18 U/L (ref 0–44)
AST: 25 U/L (ref 15–41)
Albumin: 4.3 g/dL (ref 3.5–5.0)
Alkaline Phosphatase: 52 U/L (ref 38–126)
Anion gap: 9 (ref 5–15)
BUN: 14 mg/dL (ref 8–23)
CO2: 26 mmol/L (ref 22–32)
Calcium: 9.2 mg/dL (ref 8.9–10.3)
Chloride: 106 mmol/L (ref 98–111)
Creatinine, Ser: 0.66 mg/dL (ref 0.44–1.00)
GFR calc Af Amer: >60 mL/min (ref >60)
GFR calc non Af Amer: >60 mL/min (ref >60)
Glucose, Bld: 95 mg/dL (ref 70–99)
Potassium: 4 mmol/L (ref 3.5–5.1)
Sodium: 141 mmol/L (ref 135–145)
Total Bilirubin: 0.7 mg/dL (ref 0.3–1.2)
Total Protein: 6.9 g/dL (ref 6.5–8.1)

## 2019-06-22 LAB — CBC WITH DIFFERENTIAL/PLATELET
Abs Immature Granulocytes: 0.01 10*3/uL (ref 0.00–0.07)
Basophils Absolute: 0 10*3/uL (ref 0.0–0.1)
Basophils Relative: 0 %
Eosinophils Absolute: 0.1 10*3/uL (ref 0.0–0.5)
Eosinophils Relative: 1 %
HCT: 38.4 % (ref 36.0–46.0)
Hemoglobin: 12.8 g/dL (ref 12.0–15.0)
Immature Granulocytes: 0 %
Lymphocytes Relative: 32 %
Lymphs Abs: 2.2 10*3/uL (ref 0.7–4.0)
MCH: 31.9 pg (ref 26.0–34.0)
MCHC: 33.3 g/dL (ref 30.0–36.0)
MCV: 95.8 fL (ref 80.0–100.0)
Monocytes Absolute: 0.9 10*3/uL (ref 0.1–1.0)
Monocytes Relative: 13 %
Neutro Abs: 3.7 10*3/uL (ref 1.7–7.7)
Neutrophils Relative %: 54 %
Platelets: 185 10*3/uL (ref 150–400)
RBC: 4.01 MIL/uL (ref 3.87–5.11)
RDW: 13.2 % (ref 11.5–15.5)
WBC: 6.9 10*3/uL (ref 4.0–10.5)
nRBC: 0 % (ref 0.0–0.2)

## 2019-06-22 MED ORDER — IOHEXOL 300 MG/ML  SOLN
75.0000 mL | Freq: Once | INTRAMUSCULAR | Status: AC | PRN
  Administered 2019-06-22: 75 mL via INTRAVENOUS

## 2019-06-22 MED ORDER — SODIUM CHLORIDE 0.9% FLUSH
10.0000 mL | Freq: Once | INTRAVENOUS | Status: AC
  Administered 2019-06-22: 10 mL via INTRAVENOUS
  Filled 2019-06-22: qty 10

## 2019-06-22 MED ORDER — HEPARIN SOD (PORK) LOCK FLUSH 100 UNIT/ML IV SOLN
500.0000 [IU] | Freq: Once | INTRAVENOUS | Status: AC
  Administered 2019-06-22: 500 [IU] via INTRAVENOUS

## 2019-06-23 ENCOUNTER — Other Ambulatory Visit: Payer: Self-pay

## 2019-06-24 ENCOUNTER — Encounter: Payer: Self-pay | Admitting: Oncology

## 2019-06-24 ENCOUNTER — Other Ambulatory Visit: Payer: Self-pay

## 2019-06-24 ENCOUNTER — Inpatient Hospital Stay: Payer: Medicare Other | Attending: Oncology | Admitting: Oncology

## 2019-06-24 VITALS — BP 135/81 | HR 105 | Temp 98.9°F | Resp 18 | Wt 115.4 lb

## 2019-06-24 DIAGNOSIS — C8208 Follicular lymphoma grade I, lymph nodes of multiple sites: Secondary | ICD-10-CM

## 2019-06-24 NOTE — Progress Notes (Signed)
Here to discuss CT results.

## 2019-09-18 ENCOUNTER — Other Ambulatory Visit: Payer: Self-pay

## 2019-09-18 NOTE — Progress Notes (Signed)
  [place]  Telephone:(336) [phone] Fax:(336) [phone]  ID: Veronica Boyd OB: 1945-12-12  MR#: [id number]  [id number]  Patient Care Team: Glendon Axe, MD as PCP - General (Internal Medicine) Lloyd Huger, MD as Consulting Physician (Oncology) Leonie Green, MD as Referring Physician (Surgery) [name], MD as Referring Physician (Radiation Oncology) Jacquelin Hawking, NP as Nurse Practitioner (Oncology)    Lloyd Huger, MD   09/18/2019 5:31 PM     This encounter was created in error - please disregard.

## 2019-09-21 ENCOUNTER — Other Ambulatory Visit: Payer: Self-pay

## 2019-09-21 ENCOUNTER — Inpatient Hospital Stay: Payer: Medicare Other | Attending: Oncology

## 2019-09-21 DIAGNOSIS — C8208 Follicular lymphoma grade I, lymph nodes of multiple sites: Secondary | ICD-10-CM | POA: Insufficient documentation

## 2019-09-21 LAB — CBC WITH DIFFERENTIAL/PLATELET
Abs Immature Granulocytes: 0.01 10*3/uL (ref 0.00–0.07)
Basophils Absolute: 0 10*3/uL (ref 0.0–0.1)
Basophils Relative: 0 %
Eosinophils Absolute: 0.1 10*3/uL (ref 0.0–0.5)
Eosinophils Relative: 1 %
HCT: 40.3 % (ref 36.0–46.0)
Hemoglobin: 13.3 g/dL (ref 12.0–15.0)
Immature Granulocytes: 0 %
Lymphocytes Relative: 39 %
Lymphs Abs: 3.1 10*3/uL (ref 0.7–4.0)
MCH: 31.7 pg (ref 26.0–34.0)
MCHC: 33 g/dL (ref 30.0–36.0)
MCV: 96 fL (ref 80.0–100.0)
Monocytes Absolute: 1 10*3/uL (ref 0.1–1.0)
Monocytes Relative: 13 %
Neutro Abs: 3.6 10*3/uL (ref 1.7–7.7)
Neutrophils Relative %: 47 %
Platelets: 196 10*3/uL (ref 150–400)
RBC: 4.2 MIL/uL (ref 3.87–5.11)
RDW: 12.8 % (ref 11.5–15.5)
WBC: 7.8 10*3/uL (ref 4.0–10.5)
nRBC: 0 % (ref 0.0–0.2)

## 2019-09-21 LAB — COMPREHENSIVE METABOLIC PANEL
ALT: 14 U/L (ref 0–44)
AST: 20 U/L (ref 15–41)
Albumin: 4.3 g/dL (ref 3.5–5.0)
Alkaline Phosphatase: 65 U/L (ref 38–126)
Anion gap: 5 (ref 5–15)
BUN: 20 mg/dL (ref 8–23)
CO2: 25 mmol/L (ref 22–32)
Calcium: 9 mg/dL (ref 8.9–10.3)
Chloride: 107 mmol/L (ref 98–111)
Creatinine, Ser: 0.74 mg/dL (ref 0.44–1.00)
GFR calc Af Amer: >60 mL/min (ref >60)
GFR calc non Af Amer: >60 mL/min (ref >60)
Glucose, Bld: 81 mg/dL (ref 70–99)
Potassium: 4.1 mmol/L (ref 3.5–5.1)
Sodium: 137 mmol/L (ref 135–145)
Total Bilirubin: 0.5 mg/dL (ref 0.3–1.2)
Total Protein: 7.1 g/dL (ref 6.5–8.1)

## 2019-09-21 MED ORDER — HEPARIN SOD (PORK) LOCK FLUSH 100 UNIT/ML IV SOLN
500.0000 [IU] | Freq: Once | INTRAVENOUS | Status: AC
  Administered 2019-09-21: 500 [IU] via INTRAVENOUS
  Filled 2019-09-21: qty 5

## 2019-09-21 MED ORDER — SODIUM CHLORIDE 0.9% FLUSH
10.0000 mL | Freq: Once | INTRAVENOUS | Status: AC
  Administered 2019-09-21: 10 mL via INTRAVENOUS
  Filled 2019-09-21: qty 10

## 2019-09-21 NOTE — Progress Notes (Signed)
Patient is supposed to do dox visit. She does not have smart phone, but her son's number is listed but he will be asleep and her daughter will be working

## 2019-09-22 ENCOUNTER — Inpatient Hospital Stay: Payer: Medicare Other | Admitting: Oncology

## 2019-09-23 ENCOUNTER — Encounter: Payer: Self-pay | Admitting: Physician Assistant

## 2019-12-19 NOTE — Progress Notes (Deleted)
[place]  Telephone:(336) [phone] Fax:(336) [phone]  ID: Veronica Boyd OB: [birth year]  MR#: [id number]  BLT#:[id number]  Patient Care Team: Glendon Axe, MD as PCP - General (Internal Medicine) Lloyd Huger, MD as Consulting Physician (Oncology) Leonie Green, MD as Referring Physician (Surgery) [name], MD as Referring Physician (Radiation Oncology) Jacquelin Hawking, NP as Nurse Practitioner (Oncology)   CHIEF COMPLAINT: Follicular lymphoma  INTERVAL HISTORY: Patient returns to clinic today for repeat laboratory work, further evaluation, and discussion of her imaging results.  She continues to feel well and remains asymptomatic. She has noticed no new lymphadenopathy.  She has no neurologic complaints.  She has a good appetite and her weight is stable.  She denies any fevers, night sweats, or weight loss.  She denies any chest pain, shortness of breath, cough, or hemoptysis.  She denies any nausea, vomiting, constipation, or diarrhea. She has no urinary complaints.  Patient feels that her baseline offers no specific implants today.  REVIEW OF SYSTEMS:   Review of Systems  Constitutional: Negative.  Negative for fever, malaise/fatigue and weight loss.  Respiratory: Negative.  Negative for cough, hemoptysis and shortness of breath.   Cardiovascular: Negative.  Negative for chest pain and leg swelling.  Gastrointestinal: Negative.  Negative for abdominal pain, blood in stool, melena, nausea and vomiting.  Genitourinary: Negative.  Negative for dysuria.  Musculoskeletal: Negative.  Negative for back pain, joint pain and neck pain.  Skin: Negative.  Negative for rash.  Neurological: Negative.  Negative for sensory change, focal weakness, weakness and headaches.  Psychiatric/Behavioral: Negative.  The patient is not nervous/anxious and does not have insomnia.    As per HPI. Otherwise, a complete review of systems is negative.   PAST  MEDICAL HISTORY: Past Medical History:  Diagnosis Date  . Arthritis   . Cataract    bilateral  . Chicken pox   . Colon polyp   . Follicular lymphoma ([place]) 08/2016   lymph nodes   . Hyperlipidemia   . Osteoporosis     PAST SURGICAL HISTORY: Past Surgical History:  Procedure Laterality Date  . AXILLARY LYMPH NODE DISSECTION Right 08/21/2016   Procedure: AXILLARY LYMPH NODE excision;  Surgeon: Leonie Green, MD;  Location: [facility] ORS;  Service: General;  Laterality: Right;  . CATARACT EXTRACTION, BILATERAL Bilateral   . JOINT REPLACEMENT Left   . PORTA CATH INSERTION N/A 09/16/2017   Procedure: PORTA CATH INSERTION;  Surgeon: Algernon Huxley, MD;  Location: [place] [facility];  Service: Cardiovascular;  Laterality: N/A;  . TONSILLECTOMY    . TOTAL HIP ARTHROPLASTY Left 1992    FAMILY HISTORY: Family History  Problem Relation Age of Onset  . Diabetes Sister   . Lung cancer Brother   . Diabetes Brother   . Basal cell carcinoma Daughter   . Breast cancer Paternal Aunt     ADVANCED DIRECTIVES (Y/N):  N  HEALTH MAINTENANCE: Social History   Tobacco Use  . Smoking status: Never Smoker  . Smokeless tobacco: Never Used  Substance Use Topics  . Alcohol use: No  . Drug use: No     Colonoscopy:  PAP:  Bone density:  Lipid panel:  No Known Allergies  Current Outpatient Medications  Medication Sig Dispense Refill  . alendronate (FOSAMAX) 70 MG tablet TAKE 1 TABLET EVERY 7 DAYS TAKE WITH A FULL GLASS OF WATER. DO NOT LIE DOWN FOR THE NEXT 30 MIN.  3  . diclofenac sodium (VOLTAREN) 1 %  GEL APPLY 2 GRAMS TOPICALLY 2 (TWO) TIMES DAILY    . lidocaine-prilocaine (EMLA) cream Apply 1 application topically as needed. 30 g 1  . meloxicam (MOBIC) 7.5 MG tablet Take by mouth.    . Multiple Vitamins-Minerals (CENTRUM SILVER PO) Take 1 tablet by mouth daily.    . pantoprazole (PROTONIX) 20 MG tablet Take 20 mg by mouth at bedtime.     . simvastatin (ZOCOR) 20 MG tablet TAKE 1  TABLET BY MOUTH EVERY DAY AT NIGHT    . tiZANidine (ZANAFLEX) 2 MG tablet Take by mouth.    . traMADol-acetaminophen (ULTRACET) 37.5-325 MG tablet Take 1 tablet by mouth every 8 (eight) hours as needed.      No current facility-administered medications for this visit.   Facility-Administered Medications Ordered in Other Visits  Medication Dose Route Frequency Provider Last Rate Last Admin  . heparin lock flush 100 unit/mL  500 Units Intravenous Once Lloyd Huger, MD      . heparin lock flush 100 unit/mL  500 Units Intracatheter PRN Lloyd Huger, MD      . sodium chloride flush (NS) 0.9 % injection 10 mL  10 mL Intravenous PRN Lloyd Huger, MD   10 mL at 12/04/17 0901  . sodium chloride flush (NS) 0.9 % injection 10 mL  10 mL Intracatheter PRN Lloyd Huger, MD      . yttrium-90 injection 45.8 millicurie  09.9 millicurie Intravenous Once [name], MD        OBJECTIVE: There were no vitals filed for this visit.   There is no height or weight on file to calculate BMI.    ECOG FS:0 - Asymptomatic  General: Well-developed, well-nourished, no acute distress. Eyes: Pink conjunctiva, anicteric sclera. HEENT: Normocephalic, moist mucous membranes. Lungs: Clear to auscultation bilaterally. Heart: Regular rate and rhythm. No rubs, murmurs, or gallops. Abdomen: Soft, nontender, nondistended. No organomegaly noted, normoactive bowel sounds. Musculoskeletal: No edema, cyanosis, or clubbing. Neuro: Alert, answering all questions appropriately. Cranial nerves grossly intact. Skin: No rashes or petechiae noted. Psych: Normal affect.  LAB RESULTS:  Lab Results  Component Value Date   NA 137 09/21/2019   K 4.1 09/21/2019   CL 107 09/21/2019   CO2 25 09/21/2019   GLUCOSE 81 09/21/2019   BUN 20 09/21/2019   CREATININE 0.74 09/21/2019   CALCIUM 9.0 09/21/2019   PROT 7.1 09/21/2019   ALBUMIN 4.3 09/21/2019   AST 20 09/21/2019   ALT 14 09/21/2019   ALKPHOS 65  09/21/2019   BILITOT 0.5 09/21/2019   GFRNONAA >60 09/21/2019   GFRAA >60 09/21/2019    Lab Results  Component Value Date   WBC 7.8 09/21/2019   NEUTROABS 3.6 09/21/2019   HGB 13.3 09/21/2019   HCT 40.3 09/21/2019   MCV 96.0 09/21/2019   PLT 196 09/21/2019     STUDIES: No results found.   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  PLAN:    1. Recurrent follicular lymphoma: CT scan results from June 22, 2019 reviewed independently reported as above with no obvious evidence of recurrent or progressive disease.  Calcified right axillary lesion continues to decrease in size and is now 1.9 x 1.4 cm. Patient received Zevalin on July 30, 2017 with not much therapeutic effect.  She subsequently underwent cycles 5 of R-CHOP chemotherapy with Neulasta support completing on December 12, 2017.  No further interventions or treatments are needed at this time.  Return to clinic in 3 months with laboratory  work and video-assisted telemedicine visit.  Will repeat imaging in 6 months at which point patient can likely be transitioned to yearly imaging.  I spent a total of 20 minutes face-to-face with the patient of which greater than 50% of the visit was spent in counseling and coordination of care as detailed above.   Patient expressed understanding and was in agreement with this plan. She also understands that She can call clinic at any time with any questions, concerns, or complaints.   Cancer Staging Follicular lymphoma ([place]) Staging form: Lymphoid Neoplasms, AJCC 6th Edition - Clinical stage from 08/27/2016: Stage IIE (lymphoma only) - Signed by Lloyd Huger, MD on 08/27/2016   Lloyd Huger, MD   12/19/2019 10:44 AM

## 2019-12-22 ENCOUNTER — Other Ambulatory Visit: Payer: Self-pay

## 2019-12-23 ENCOUNTER — Other Ambulatory Visit: Payer: Self-pay

## 2019-12-23 ENCOUNTER — Ambulatory Visit: Payer: Medicare Other

## 2019-12-23 ENCOUNTER — Inpatient Hospital Stay: Payer: Medicare Other | Attending: Oncology

## 2019-12-23 ENCOUNTER — Ambulatory Visit
Admission: RE | Admit: 2019-12-23 | Discharge: 2019-12-23 | Disposition: A | Discharge status: Home / Self Care | Payer: Medicare Other | Source: Ambulatory Visit | Attending: Oncology | Admitting: Oncology

## 2019-12-23 DIAGNOSIS — C8208 Follicular lymphoma grade I, lymph nodes of multiple sites: Secondary | ICD-10-CM | POA: Diagnosis present

## 2019-12-23 DIAGNOSIS — Z95828 Presence of other vascular implants and grafts: Secondary | ICD-10-CM

## 2019-12-23 LAB — CBC WITH DIFFERENTIAL/PLATELET
Abs Immature Granulocytes: 0.03 10*3/uL (ref 0.00–0.07)
Basophils Absolute: 0 10*3/uL (ref 0.0–0.1)
Basophils Relative: 1 %
Eosinophils Absolute: 0.1 10*3/uL (ref 0.0–0.5)
Eosinophils Relative: 1 %
HCT: 40.5 % (ref 36.0–46.0)
Hemoglobin: 13 g/dL (ref 12.0–15.0)
Immature Granulocytes: 1 %
Lymphocytes Relative: 34 %
Lymphs Abs: 2.2 10*3/uL (ref 0.7–4.0)
MCH: 31.9 pg (ref 26.0–34.0)
MCHC: 32.1 g/dL (ref 30.0–36.0)
MCV: 99.3 fL (ref 80.0–100.0)
Monocytes Absolute: 0.8 10*3/uL (ref 0.1–1.0)
Monocytes Relative: 13 %
Neutro Abs: 3.2 10*3/uL (ref 1.7–7.7)
Neutrophils Relative %: 50 %
Platelets: 188 10*3/uL (ref 150–400)
RBC: 4.08 MIL/uL (ref 3.87–5.11)
RDW: 12.7 % (ref 11.5–15.5)
WBC: 6.5 10*3/uL (ref 4.0–10.5)
nRBC: 0 % (ref 0.0–0.2)

## 2019-12-23 LAB — COMPREHENSIVE METABOLIC PANEL
ALT: 16 U/L (ref 0–44)
AST: 19 U/L (ref 15–41)
Albumin: 4.3 g/dL (ref 3.5–5.0)
Alkaline Phosphatase: 63 U/L (ref 38–126)
Anion gap: 9 (ref 5–15)
BUN: 16 mg/dL (ref 8–23)
CO2: 24 mmol/L (ref 22–32)
Calcium: 9.2 mg/dL (ref 8.9–10.3)
Chloride: 107 mmol/L (ref 98–111)
Creatinine, Ser: 0.7 mg/dL (ref 0.44–1.00)
GFR calc Af Amer: >60 mL/min (ref >60)
GFR calc non Af Amer: >60 mL/min (ref >60)
Glucose, Bld: 88 mg/dL (ref 70–99)
Potassium: 4.1 mmol/L (ref 3.5–5.1)
Sodium: 140 mmol/L (ref 135–145)
Total Bilirubin: 0.7 mg/dL (ref 0.3–1.2)
Total Protein: 7.3 g/dL (ref 6.5–8.1)

## 2019-12-23 MED ORDER — IOHEXOL 300 MG/ML  SOLN
80.0000 mL | Freq: Once | INTRAMUSCULAR | Status: AC | PRN
  Administered 2019-12-23: 10:00:00 80 mL via INTRAVENOUS

## 2019-12-23 MED ORDER — HEPARIN SOD (PORK) LOCK FLUSH 100 UNIT/ML IV SOLN
500.0000 [IU] | Freq: Once | INTRAVENOUS | Status: AC
  Administered 2019-12-23: 10:00:00 500 [IU] via INTRAVENOUS
  Filled 2019-12-23: qty 5

## 2019-12-23 MED ORDER — SODIUM CHLORIDE 0.9% FLUSH
10.0000 mL | Freq: Once | INTRAVENOUS | Status: AC
  Administered 2019-12-23: 10 mL via INTRAVENOUS
  Filled 2019-12-23: qty 10

## 2019-12-24 ENCOUNTER — Other Ambulatory Visit: Payer: Medicare Other

## 2019-12-24 ENCOUNTER — Inpatient Hospital Stay: Payer: Medicare Other | Admitting: Oncology

## 2019-12-24 NOTE — Progress Notes (Signed)
[place]  [phone] [phone]  ID: Veronica Boyd OB: 20-Nov-1945  MR#: [id number]  WVP#:[id number]  Patient Care Team: Glendon Axe, MD as PCP - General (Internal Medicine) Lloyd Huger, MD as Consulting Physician (Oncology) Leonie Green, MD as Referring Physician (Surgery) [name], MD as Referring Physician (Radiation Oncology) Jacquelin Hawking, NP as Nurse Practitioner (Oncology)   CHIEF COMPLAINT: Follicular lymphoma  INTERVAL HISTORY: Patient returns to clinic today for repeat laboratory work, further evaluation, and discussion of her imaging results.  She continues to feel well and remains asymptomatic.  She has noticed no new lymphadenopathy.  She has no neurologic complaints.  She has a good appetite and her weight is stable.  She denies any fevers, night sweats, or weight loss.  She denies any chest pain, shortness of breath, cough, or hemoptysis.  She denies any nausea, vomiting, constipation, or diarrhea. She has no urinary complaints.  Patient offers no specific complaints today.  REVIEW OF SYSTEMS:   Review of Systems  Constitutional: Negative.  Negative for fever, malaise/fatigue and weight loss.  Respiratory: Negative.  Negative for cough, hemoptysis and shortness of breath.   Cardiovascular: Negative.  Negative for chest pain and leg swelling.  Gastrointestinal: Negative.  Negative for abdominal pain, blood in stool, melena, nausea and vomiting.  Genitourinary: Negative.  Negative for dysuria.  Musculoskeletal: Negative.  Negative for back pain, joint pain and neck pain.  Skin: Negative.  Negative for rash.  Neurological: Negative.  Negative for sensory change, focal weakness, weakness and headaches.  Psychiatric/Behavioral: Negative.  The patient is not nervous/anxious and does not have insomnia.    As per HPI. Otherwise, a complete review of systems is negative.   PAST MEDICAL HISTORY: Past  Medical History:  Diagnosis Date  . Arthritis   . Cataract    bilateral  . Chicken pox   . Colon polyp   . Follicular lymphoma ([place]) 08/2016   lymph nodes   . Hyperlipidemia   . Osteoporosis     PAST SURGICAL HISTORY: Past Surgical History:  Procedure Laterality Date  . AXILLARY LYMPH NODE DISSECTION Right 08/21/2016   Procedure: AXILLARY LYMPH NODE excision;  Surgeon: Leonie Green, MD;  Location: [facility];  Service: General;  Laterality: Right;  . CATARACT EXTRACTION, BILATERAL Bilateral   . JOINT REPLACEMENT Left   . PORTA CATH INSERTION N/A 09/16/2017   Procedure: PORTA CATH INSERTION;  Surgeon: Algernon Huxley, MD;  Location: [facility];  Service: Cardiovascular;  Laterality: N/A;  . TONSILLECTOMY    . TOTAL HIP ARTHROPLASTY Left 1992    FAMILY HISTORY: Family History  Problem Relation Age of Onset  . Diabetes Sister   . Lung cancer Brother   . Diabetes Brother   . Basal cell carcinoma Daughter   . Breast cancer Paternal Aunt     ADVANCED DIRECTIVES (Y/N):  N  HEALTH MAINTENANCE: Social History   Tobacco Use  . Smoking status: Never Smoker  . Smokeless tobacco: Never Used  Substance Use Topics  . Alcohol use: No  . Drug use: No     Colonoscopy:  PAP:  Bone density:  Lipid panel:  No Known Allergies  Current Outpatient Medications  Medication Sig Dispense Refill  . alendronate (FOSAMAX) 70 MG tablet TAKE 1 TABLET EVERY 7 DAYS TAKE WITH A FULL GLASS OF WATER. DO NOT LIE DOWN FOR THE NEXT 30 MIN.  3  . diclofenac sodium (VOLTAREN) 1 % GEL APPLY 2  GRAMS TOPICALLY 2 (TWO) TIMES DAILY    . lidocaine-prilocaine (EMLA) cream Apply 1 application topically as needed. 30 g 1  . meloxicam (MOBIC) 7.5 MG tablet Take by mouth.    . Multiple Vitamins-Minerals (CENTRUM SILVER PO) Take 1 tablet by mouth daily.    . pantoprazole (PROTONIX) 20 MG tablet Take 20 mg by mouth at bedtime.     . simvastatin (ZOCOR) 20 MG tablet TAKE 1 TABLET BY MOUTH EVERY  DAY AT NIGHT    . tiZANidine (ZANAFLEX) 2 MG tablet Take by mouth.    . traMADol-acetaminophen (ULTRACET) 37.5-325 MG tablet Take 1 tablet by mouth every 8 (eight) hours as needed.      No current facility-administered medications for this visit.   Facility-Administered Medications Ordered in Other Visits  Medication Dose Route Frequency Provider Last Rate Last Admin  . heparin lock flush 100 unit/mL  500 Units Intravenous Once Lloyd Huger, MD      . heparin lock flush 100 unit/mL  500 Units Intracatheter PRN Lloyd Huger, MD      . sodium chloride flush (NS) 0.9 % injection 10 mL  10 mL Intravenous PRN Lloyd Huger, MD   10 mL at 12/04/17 0901  . sodium chloride flush (NS) 0.9 % injection 10 mL  10 mL Intracatheter PRN Lloyd Huger, MD      . yttrium-90 injection 75.1 millicurie  02.5 millicurie Intravenous Once [name], MD        OBJECTIVE: Vitals:   12/30/19 1108  BP: 128/68  Pulse: 85  Resp: 17  Temp: 98.4 F (36.9 C)  SpO2: 98%     Body mass index is 24.5 kg/m.    ECOG FS:0 - Asymptomatic  General: Well-developed, well-nourished, no acute distress. Eyes: Pink conjunctiva, anicteric sclera. HEENT: Normocephalic, moist mucous membranes. Lungs: No audible wheezing or coughing. Heart: Regular rate and rhythm. Abdomen: Soft, nontender, no obvious distention. Musculoskeletal: No edema, cyanosis, or clubbing. Neuro: Alert, answering all questions appropriately. Cranial nerves grossly intact. Skin: No rashes or petechiae noted. Psych: Normal affect. Lymphatics: No palpable lymphadenopathy.  LAB RESULTS:  Lab Results  Component Value Date   NA 140 12/23/2019   K 4.1 12/23/2019   CL 107 12/23/2019   CO2 24 12/23/2019   GLUCOSE 88 12/23/2019   BUN 16 12/23/2019   CREATININE 0.70 12/23/2019   CALCIUM 9.2 12/23/2019   PROT 7.3 12/23/2019   ALBUMIN 4.3 12/23/2019   AST 19 12/23/2019   ALT 16 12/23/2019   ALKPHOS 63 12/23/2019    BILITOT 0.7 12/23/2019   GFRNONAA >60 12/23/2019   GFRAA >60 12/23/2019    Lab Results  Component Value Date   WBC 6.5 12/23/2019   NEUTROABS 3.2 12/23/2019   HGB 13.0 12/23/2019   HCT 40.5 12/23/2019   MCV 99.3 12/23/2019   PLT 188 12/23/2019     STUDIES: CT SOFT TISSUE NECK W CONTRAST  Result Date: 12/23/2019 CLINICAL DATA:  Restaging follicular lymphoma. EXAM: CT NECK WITH CONTRAST TECHNIQUE: Multidetector CT imaging of the neck was performed using the standard protocol following the bolus administration of intravenous contrast. CONTRAST:  49m OMNIPAQUE IOHEXOL 300 MG/ML  SOLN COMPARISON:  CT neck 06/22/2019 FINDINGS: Pharynx and larynx: Normal. No mass or swelling. Salivary glands: No inflammation, mass, or stone. Thyroid: Normal size.  No focal nodule Lymph nodes: No enlarged or pathologic lymph nodes in the neck. Vascular: Normal arterial and venous enhancement. Right jugular Port-A-Cath noted. Limited intracranial: Negative Visualized orbits:  Bilateral cataract extraction.  No orbital mass. Mastoids and visualized paranasal sinuses: Clear Skeleton: Cervical spondylosis.  No acute skeletal abnormality. Upper chest: Referred to CT chest results from today. Other: None IMPRESSION: Negative for mass or adenopathy in the neck. No recurrent lymphoma identified. Electronically Signed   By: Franchot Gallo M.D.   On: 12/23/2019 14:00   CT CHEST W CONTRAST  Result Date: 12/23/2019 CLINICAL DATA:  74 year old female with history of follicular lymphoma. Follow-up study. EXAM: CT CHEST, ABDOMEN, AND PELVIS WITH CONTRAST TECHNIQUE: Multidetector CT imaging of the chest, abdomen and pelvis was performed following the standard protocol during bolus administration of intravenous contrast. CONTRAST:  63m OMNIPAQUE IOHEXOL 300 MG/ML  SOLN COMPARISON:  CT the chest, abdomen and pelvis 06/22/2019. FINDINGS: CT CHEST FINDINGS Cardiovascular: Heart size is normal. There is no significant pericardial fluid,  thickening or pericardial calcification. There is aortic atherosclerosis, as well as atherosclerosis of the great vessels of the mediastinum and the coronary arteries, including calcified atherosclerotic plaque in the left main and left anterior descending coronary arteries. Right internal jugular single-lumen porta cath with tip terminating at the superior cavoatrial junction. Mediastinum/Nodes: No pathologically enlarged mediastinal or hilar lymph nodes. Esophagus is unremarkable in appearance. Mildly enlarged right axillary lymph node measuring 1.2 cm in short axis which appears partially calcified, decreased in size compared to the prior examination (previously 1.4 cm in short axis on 06/22/2019). No left axillary lymphadenopathy. Lungs/Pleura: No suspicious appearing pulmonary nodules or masses are noted. Chronic areas of ground-glass attenuation and septal thickening in the subpleural aspect of the right upper lobe near the apex, similar to prior studies, likely chronic postradiation changes. No acute consolidative airspace disease. No pleural effusions. Musculoskeletal: There are no aggressive appearing lytic or blastic lesions noted in the visualized portions of the skeleton. CT ABDOMEN PELVIS FINDINGS Hepatobiliary: No suspicious cystic or solid hepatic lesions. No intra or extrahepatic biliary ductal dilatation. Gallbladder is normal in appearance. Pancreas: No pancreatic mass. No pancreatic ductal dilatation. No pancreatic or peripancreatic fluid collections or inflammatory changes. Spleen: Unremarkable. Adrenals/Urinary Tract: Bilateral kidneys and adrenal glands are normal in appearance. No hydroureteronephrosis. Urinary bladder is largely obscured by beam hardening artifact from the patient's left hip arthroplasty, but visualized portions are unremarkable. Stomach/Bowel: Normal appearance of the stomach. No pathologic dilatation of small bowel or colon. A few scattered colonic diverticulae are noted,  without surrounding inflammatory changes to suggest an acute diverticulitis at this time. Normal appendix. Vascular/Lymphatic: Aortic atherosclerosis, without evidence of aneurysm or dissection in the abdominal or pelvic vasculature. No lymphadenopathy noted in the abdomen or pelvis. Reproductive: Uterus and ovaries are unremarkable in appearance. Other: No significant volume of ascites.  No pneumoperitoneum. Musculoskeletal: There are no aggressive appearing lytic or blastic lesions noted in the visualized portions of the skeleton. IMPRESSION: 1. Mildly enlarged partially calcified right axillary lymph node, decreased in size compared to the prior study, indicative of a positive response to therapy. No other new lymphadenopathy or other definitive findings to suggest recurrent disease in the chest, abdomen or pelvis. 2. Mild colonic diverticulosis without evidence of acute diverticulitis at this time. 3. Aortic atherosclerosis, in addition to left main and left anterior descending coronary artery disease. Assessment for potential risk factor modification, dietary therapy or pharmacologic therapy may be warranted, if clinically indicated. Electronically Signed   By: DVinnie LangtonM.D.   On: 12/23/2019 13:00   CT Abdomen Pelvis W Contrast  Result Date: 12/23/2019 CLINICAL DATA:  74year old female with history of  follicular lymphoma. Follow-up study. EXAM: CT CHEST, ABDOMEN, AND PELVIS WITH CONTRAST TECHNIQUE: Multidetector CT imaging of the chest, abdomen and pelvis was performed following the standard protocol during bolus administration of intravenous contrast. CONTRAST:  67m OMNIPAQUE IOHEXOL 300 MG/ML  SOLN COMPARISON:  CT the chest, abdomen and pelvis 06/22/2019. FINDINGS: CT CHEST FINDINGS Cardiovascular: Heart size is normal. There is no significant pericardial fluid, thickening or pericardial calcification. There is aortic atherosclerosis, as well as atherosclerosis of the great vessels of the  mediastinum and the coronary arteries, including calcified atherosclerotic plaque in the left main and left anterior descending coronary arteries. Right internal jugular single-lumen porta cath with tip terminating at the superior cavoatrial junction. Mediastinum/Nodes: No pathologically enlarged mediastinal or hilar lymph nodes. Esophagus is unremarkable in appearance. Mildly enlarged right axillary lymph node measuring 1.2 cm in short axis which appears partially calcified, decreased in size compared to the prior examination (previously 1.4 cm in short axis on 06/22/2019). No left axillary lymphadenopathy. Lungs/Pleura: No suspicious appearing pulmonary nodules or masses are noted. Chronic areas of ground-glass attenuation and septal thickening in the subpleural aspect of the right upper lobe near the apex, similar to prior studies, likely chronic postradiation changes. No acute consolidative airspace disease. No pleural effusions. Musculoskeletal: There are no aggressive appearing lytic or blastic lesions noted in the visualized portions of the skeleton. CT ABDOMEN PELVIS FINDINGS Hepatobiliary: No suspicious cystic or solid hepatic lesions. No intra or extrahepatic biliary ductal dilatation. Gallbladder is normal in appearance. Pancreas: No pancreatic mass. No pancreatic ductal dilatation. No pancreatic or peripancreatic fluid collections or inflammatory changes. Spleen: Unremarkable. Adrenals/Urinary Tract: Bilateral kidneys and adrenal glands are normal in appearance. No hydroureteronephrosis. Urinary bladder is largely obscured by beam hardening artifact from the patient's left hip arthroplasty, but visualized portions are unremarkable. Stomach/Bowel: Normal appearance of the stomach. No pathologic dilatation of small bowel or colon. A few scattered colonic diverticulae are noted, without surrounding inflammatory changes to suggest an acute diverticulitis at this time. Normal appendix. Vascular/Lymphatic:  Aortic atherosclerosis, without evidence of aneurysm or dissection in the abdominal or pelvic vasculature. No lymphadenopathy noted in the abdomen or pelvis. Reproductive: Uterus and ovaries are unremarkable in appearance. Other: No significant volume of ascites.  No pneumoperitoneum. Musculoskeletal: There are no aggressive appearing lytic or blastic lesions noted in the visualized portions of the skeleton. IMPRESSION: 1. Mildly enlarged partially calcified right axillary lymph node, decreased in size compared to the prior study, indicative of a positive response to therapy. No other new lymphadenopathy or other definitive findings to suggest recurrent disease in the chest, abdomen or pelvis. 2. Mild colonic diverticulosis without evidence of acute diverticulitis at this time. 3. Aortic atherosclerosis, in addition to left main and left anterior descending coronary artery disease. Assessment for potential risk factor modification, dietary therapy or pharmacologic therapy may be warranted, if clinically indicated. Electronically Signed   By: DVinnie LangtonM.D.   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  PLAN:    1. Recurrent follicular lymphoma: CT scan results from December 23, 2019 reviewed independently and reported as above with no obvious evidence of recurrent or progressive disease.  Patient received Zevalin on July 30, 2017 with not much therapeutic effect.  She subsequently underwent cycles 5 of R-CHOP chemotherapy with Neulasta support completing on December 12, 2017.  Patient is now 2 years removed from treatment and can be switched to yearly imaging.  Return to clinic in 6 months  with repeat laboratory work and further evaluation.  I spent a total of 20 minutes reviewing chart data, face-to-face evaluation with the patient, counseling and coordination of care as detailed above.    Patient expressed understanding and was in agreement  with this plan. She also understands that She can call clinic at any time with any questions, concerns, or complaints.   Cancer Staging 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 [facility]) Staging form: Lymphoid Neoplasms, AJCC 6th Edition - Clinical stage from 08/27/2016: Stage IIE (lymphoma only) - Signed by Lloyd Huger, MD on 08/27/2016   Lloyd Huger, MD   01/01/2020 6:44 AM

## 2019-12-29 ENCOUNTER — Other Ambulatory Visit: Payer: Self-pay

## 2019-12-29 NOTE — Progress Notes (Signed)
Patient prescreened for appointment. Patient has no concerns or questions.  

## 2019-12-30 ENCOUNTER — Inpatient Hospital Stay (HOSPITAL_BASED_OUTPATIENT_CLINIC_OR_DEPARTMENT_OTHER): Payer: Medicare Other | Admitting: Oncology

## 2019-12-30 ENCOUNTER — Other Ambulatory Visit: Payer: Self-pay

## 2019-12-30 VITALS — BP 128/68 | HR 85 | Temp 98.4°F | Resp 17 | Wt 121.3 lb

## 2019-12-30 DIAGNOSIS — C8208 Follicular lymphoma grade I, lymph nodes of multiple sites: Secondary | ICD-10-CM | POA: Diagnosis not present

## 2020-01-07 ENCOUNTER — Ambulatory Visit: Payer: Medicare Other | Attending: Internal Medicine

## 2020-01-07 DIAGNOSIS — Z23 Encounter for immunization: Secondary | ICD-10-CM | POA: Insufficient documentation

## 2020-01-30 ENCOUNTER — Ambulatory Visit: Payer: Medicare Other

## 2020-02-02 ENCOUNTER — Ambulatory Visit: Payer: Medicare Other | Attending: Internal Medicine

## 2020-02-02 DIAGNOSIS — Z23 Encounter for immunization: Secondary | ICD-10-CM

## 2020-02-02 NOTE — Progress Notes (Signed)
   Covid-19 Vaccination Clinic  Name:  Veronica Boyd    MRN: [id number] DOB: [birth year]  02/02/2020  Veronica Boyd was observed post Covid-19 immunization for 15 minutes without incident. She was provided with Vaccine Information Sheet and instruction to access the V-Safe system.   Veronica Boyd was instructed to call 911 with any severe reactions post vaccine: Marland Kitchen Difficulty breathing  . Swelling of face and throat  . A fast heartbeat  . A bad rash all over body  . Dizziness and weakness   Immunizations Administered    Name Date Dose VIS Date Route   Pfizer COVID-19 Vaccine 02/02/2020  1:27 PM 0.3 mL 10/16/2019 Intramuscular   Manufacturer: [place]   Lot: [id number]   [place]: [id number]

## 2020-02-10 ENCOUNTER — Inpatient Hospital Stay: Payer: Medicare Other | Attending: Oncology

## 2020-02-10 ENCOUNTER — Other Ambulatory Visit: Payer: Self-pay

## 2020-02-10 DIAGNOSIS — Z452 Encounter for adjustment and management of vascular access device: Secondary | ICD-10-CM | POA: Diagnosis present

## 2020-02-10 DIAGNOSIS — Z95828 Presence of other vascular implants and grafts: Secondary | ICD-10-CM

## 2020-02-10 DIAGNOSIS — C8208 Follicular lymphoma grade I, lymph nodes of multiple sites: Secondary | ICD-10-CM | POA: Diagnosis not present

## 2020-02-10 MED ORDER — HEPARIN SOD (PORK) LOCK FLUSH 100 UNIT/ML IV SOLN
500.0000 [IU] | Freq: Once | INTRAVENOUS | Status: AC
  Administered 2020-02-10: 500 [IU] via INTRAVENOUS
  Filled 2020-02-10: qty 5

## 2020-02-10 MED ORDER — SODIUM CHLORIDE 0.9% FLUSH
10.0000 mL | Freq: Once | INTRAVENOUS | Status: AC
  Administered 2020-02-10: 10 mL via INTRAVENOUS
  Filled 2020-02-10: qty 10

## 2020-04-06 ENCOUNTER — Inpatient Hospital Stay: Payer: Medicare Other

## 2020-04-07 ENCOUNTER — Other Ambulatory Visit: Payer: Self-pay

## 2020-04-07 ENCOUNTER — Inpatient Hospital Stay: Payer: Medicare Other | Attending: Oncology

## 2020-04-07 DIAGNOSIS — C8208 Follicular lymphoma grade I, lymph nodes of multiple sites: Secondary | ICD-10-CM | POA: Insufficient documentation

## 2020-04-07 DIAGNOSIS — Z95828 Presence of other vascular implants and grafts: Secondary | ICD-10-CM

## 2020-04-07 DIAGNOSIS — Z452 Encounter for adjustment and management of vascular access device: Secondary | ICD-10-CM | POA: Diagnosis not present

## 2020-04-07 MED ORDER — HEPARIN SOD (PORK) LOCK FLUSH 100 UNIT/ML IV SOLN
INTRAVENOUS | Status: AC
  Filled 2020-04-07: qty 5

## 2020-04-07 MED ORDER — SODIUM CHLORIDE 0.9% FLUSH
10.0000 mL | Freq: Once | INTRAVENOUS | Status: AC
  Administered 2020-04-07: 10 mL via INTRAVENOUS
  Filled 2020-04-07: qty 10

## 2020-04-07 MED ORDER — HEPARIN SOD (PORK) LOCK FLUSH 100 UNIT/ML IV SOLN
500.0000 [IU] | Freq: Once | INTRAVENOUS | Status: AC
  Administered 2020-04-07: 500 [IU] via INTRAVENOUS
  Filled 2020-04-07: qty 5

## 2020-06-01 ENCOUNTER — Other Ambulatory Visit: Payer: Self-pay | Admitting: Oncology

## 2020-06-01 ENCOUNTER — Inpatient Hospital Stay: Payer: Medicare Other

## 2020-06-02 ENCOUNTER — Inpatient Hospital Stay: Payer: Medicare Other | Attending: Oncology

## 2020-06-02 ENCOUNTER — Other Ambulatory Visit: Payer: Self-pay

## 2020-06-02 DIAGNOSIS — Z95828 Presence of other vascular implants and grafts: Secondary | ICD-10-CM

## 2020-06-02 DIAGNOSIS — C8208 Follicular lymphoma grade I, lymph nodes of multiple sites: Secondary | ICD-10-CM | POA: Diagnosis present

## 2020-06-02 DIAGNOSIS — Z452 Encounter for adjustment and management of vascular access device: Secondary | ICD-10-CM | POA: Diagnosis present

## 2020-06-02 MED ORDER — SODIUM CHLORIDE 0.9% FLUSH
10.0000 mL | Freq: Once | INTRAVENOUS | Status: AC
  Administered 2020-06-02: 10 mL via INTRAVENOUS
  Filled 2020-06-02: qty 10

## 2020-06-02 MED ORDER — HEPARIN SOD (PORK) LOCK FLUSH 100 UNIT/ML IV SOLN
INTRAVENOUS | Status: AC
  Filled 2020-06-02: qty 5

## 2020-06-02 MED ORDER — HEPARIN SOD (PORK) LOCK FLUSH 100 UNIT/ML IV SOLN
500.0000 [IU] | Freq: Once | INTRAVENOUS | Status: AC
  Administered 2020-06-02: 500 [IU] via INTRAVENOUS
  Filled 2020-06-02: qty 5

## 2020-06-16 ENCOUNTER — Other Ambulatory Visit: Payer: Self-pay

## 2020-06-16 ENCOUNTER — Ambulatory Visit (INDEPENDENT_AMBULATORY_CARE_PROVIDER_SITE_OTHER): Payer: Medicare Other | Admitting: Urology

## 2020-06-16 ENCOUNTER — Encounter: Payer: Self-pay | Admitting: Urology

## 2020-06-16 VITALS — BP 131/83 | HR 97 | Ht <= 58 in | Wt 122.0 lb

## 2020-06-16 DIAGNOSIS — R310 Gross hematuria: Secondary | ICD-10-CM

## 2020-06-16 DIAGNOSIS — R3121 Asymptomatic microscopic hematuria: Secondary | ICD-10-CM | POA: Diagnosis not present

## 2020-06-16 NOTE — Progress Notes (Signed)
06/16/20 2:45 PM   Veronica Boyd 10/27/1946 [id number]  CC: asymptomatic microscopic hematuria  HPI: I saw Veronica Boyd in urology clinic today for evaluation of asymptomatic microscopic hematuria.  She has had 2 urine samples over the last 6 months with 4-10 RBCs per high-power field and no other evidence of infection.  She denies any history of gross hematuria.  She is a never smoker.  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  She denies any urinary symptoms or urinary complaints.  She has a history of lymphoma followed by Dr. Grayland Ormond in the cancer center, and recently had a CT abdomen pelvis with contrast including delayed films in February 2021 with no evidence of urologic abnormalities.  Urinalysis today benign with 0-5 WBCs, 0-2 RBCs, few bacteria, nitrite negative.    PMH: Past Medical History:  Diagnosis Date  . Arthritis   . Cataract    bilateral  . Chicken pox   . Colon polyp   . 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 Hyperlipidemia   . Osteoporosis     Surgical History: Past Surgical History:  Procedure Laterality Date  . AXILLARY LYMPH NODE DISSECTION Right 08/21/2016   Procedure: AXILLARY LYMPH NODE excision;  Surgeon: Leonie Green, MD;  Location: [facility];  Service: General;  Laterality: Right;  . CATARACT EXTRACTION, BILATERAL Bilateral   . JOINT REPLACEMENT Left   . PORTA CATH INSERTION N/A 09/16/2017   Procedure: PORTA CATH INSERTION;  Surgeon: Algernon Huxley, MD;  Location: [facility];  Service: Cardiovascular;  Laterality: N/A;  . TONSILLECTOMY    . TOTAL HIP ARTHROPLASTY Left 1992    Family History: Family History  Problem Relation Age of Onset  . Diabetes Sister   . Lung cancer Brother   . Diabetes Brother   . Basal cell carcinoma Daughter   . Breast cancer Paternal Aunt     Social History:  reports that she has never smoked. She has never used smokeless tobacco. She  reports that she does not drink alcohol and does not use drugs.  Physical Exam: BP 131/83   Pulse 97   Ht 4\' 10"  (1.473 m)   Wt 122 lb (55.3 kg)   BMI 25.50 kg/m    Constitutional:  Alert and oriented, No acute distress. Cardiovascular: No clubbing, cyanosis, or edema. Respiratory: Normal respiratory effort, no increased work of breathing. GI: Abdomen is soft, nontender, nondistended, no abdominal masses GU: No CVA tenderness  Laboratory Data: Reviewed, 2 prior urine samples with 4-10 RBCs per high-power field  Pertinent Imaging: I have personally reviewed the CT abdomen pelvis with contrast dated 12/23/2019, on my review there is no nephrolithiasis, hydronephrosis, or renal masses, or filling defects.  Assessment & Plan:   The patient is a 74 year old female with 2 episodes of low-grade microscopic hematuria who presents for further evaluation.  She has a history of lymphoma and gets regular CT scans with oncology, the most recent of February 2021 with no abnormal urologic findings.  We discussed common possible etiologies of microscopic hematuria including idiopathic, urolithiasis, medical renal disease, and malignancy. We discussed the new asymptomatic microscopic hematuria guidelines and risk categories of low, intermediate, and high risk that are based on age, risk factors like smoking, and degree of microscopic hematuria. We discussed work-up can range from repeat urinalysis, renal ultrasound and cystoscopy, to CT urogram and cystoscopy.  They fall into the high risk  category, and I recommended proceeding with cystoscopy to complete work-up.  She has an additional CT scan planned with Dr. Grayland Ormond in the next month.   Nickolas Madrid, MD 06/16/2020  [facility] [address], [place], [place] [address] [phone]

## 2020-06-16 NOTE — Patient Instructions (Signed)
Cystoscopy Cystoscopy is a procedure that is used to help diagnose and sometimes treat conditions that affect the lower urinary tract. The lower urinary tract includes the bladder and the urethra. The urethra is the tube that drains urine from the bladder. Cystoscopy is done using a thin, tube-shaped instrument with a light and camera at the end (cystoscope). The cystoscope may be hard or flexible, depending on the goal of the procedure. The cystoscope is inserted through the urethra, into the bladder. Cystoscopy may be recommended if you have:  Urinary tract infections that keep coming back.  Blood in the urine (hematuria).  An inability to control when you urinate (urinary incontinence) or an overactive bladder.  Unusual cells found in a urine sample.  A blockage in the urethra, such as a urinary stone.  Painful urination.  An abnormality in the bladder found during an intravenous pyelogram (IVP) or CT scan. Cystoscopy may also be done to remove a sample of tissue to be examined under a microscope (biopsy). What are the risks? Generally, this is a safe procedure. However, problems may occur, including:  Infection.  Bleeding.  What happens during the procedure?  1. You will be given one or more of the following: ? A medicine to numb the area (local anesthetic). 2. The area around the opening of your urethra will be cleaned. 3. The cystoscope will be passed through your urethra into your bladder. 4. Germ-free (sterile) fluid will flow through the cystoscope to fill your bladder. The fluid will stretch your bladder so that your health care provider can clearly examine your bladder walls. 5. Your doctor will look at the urethra and bladder. 6. The cystoscope will be removed The procedure may vary among health care providers  What can I expect after the procedure? After the procedure, it is common to have: 1. Some soreness or pain in your abdomen and urethra. 2. Urinary symptoms.  These include: ? Mild pain or burning when you urinate. Pain should stop within a few minutes after you urinate. This may last for up to 1 week. ? A small amount of blood in your urine for several days. ? Feeling like you need to urinate but producing only a small amount of urine. Follow these instructions at home: General instructions  Return to your normal activities as told by your health care provider.   Do not drive for 24 hours if you were given a sedative during your procedure.  Watch for any blood in your urine. If the amount of blood in your urine increases, call your health care provider.  If a tissue sample was removed for testing (biopsy) during your procedure, it is up to you to get your test results. Ask your health care provider, or the department that is doing the test, when your results will be ready.  Drink enough fluid to keep your urine pale yellow.  Keep all follow-up visits as told by your health care provider. This is important. Contact a health care provider if you:  Have pain that gets worse or does not get better with medicine, especially pain when you urinate.  Have trouble urinating.  Have more blood in your urine. Get help right away if you:  Have blood clots in your urine.  Have abdominal pain.  Have a fever or chills.  Are unable to urinate. Summary  Cystoscopy is a procedure that is used to help diagnose and sometimes treat conditions that affect the lower urinary tract.  Cystoscopy is done using   a thin, tube-shaped instrument with a light and camera at the end.  After the procedure, it is common to have some soreness or pain in your abdomen and urethra.  Watch for any blood in your urine. If the amount of blood in your urine increases, call your health care provider.  If you were prescribed an antibiotic medicine, take it as told by your health care provider. Do not stop taking the antibiotic even if you start to feel better. This  information is not intended to replace advice given to you by your health care provider. Make sure you discuss any questions you have with your health care provider. Document Revised: 10/14/2018 Document Reviewed: 10/14/2018 Elsevier Patient Education  2020 [employer]vier [employer]   

## 2020-06-17 LAB — URINALYSIS, COMPLETE
Bilirubin, UA: NEGATIVE
Glucose, UA: NEGATIVE
Ketones, UA: NEGATIVE
Leukocytes,UA: NEGATIVE
Nitrite, UA: NEGATIVE
Protein,UA: NEGATIVE
Specific Gravity, UA: >1.03 — ABNORMAL HIGH (ref 1.005–1.030)
Urobilinogen, Ur: 0.2 mg/dL (ref 0.2–1.0)
pH, UA: 5.5 (ref 5.0–7.5)

## 2020-06-17 LAB — MICROSCOPIC EXAMINATION

## 2020-06-23 NOTE — Progress Notes (Signed)
Veronica Boyd  [phone]) [phone] Fax:(336) [phone]  ID: [name] OB: [birth year]  MR#: [id number]  GYJ#:[id number]  Patient Care Team: Glendon Axe, MD as PCP - General (Internal Medicine) Lloyd Huger, MD as Consulting Physician (Oncology) Leonie Green, MD as Referring Physician (Surgery) [name], MD as Referring Physician (Radiation Oncology) Jacquelin Hawking, NP as Nurse Practitioner (Oncology)  I connected with [name] on 06/28/20 at 10:45 AM EDT by video enabled telemedicine visit and verified that I am speaking with the correct person using two identifiers.   I discussed the limitations, risks, security and privacy concerns of performing an evaluation and management service by telemedicine and the availability of in-person appointments. I also discussed with the patient that there may be a patient responsible charge related to this service. The patient expressed understanding and agreed to proceed.   Other persons participating in the visit and their role in the encounter: Patient, MD.  Patient's location: Clinic. Provider's location: Home.  CHIEF COMPLAINT: Follicular lymphoma  INTERVAL HISTORY: Patient agreed to video assisted telemedicine visit for further evaluation and discussion of her laboratory results.  She continues to feel well and remains asymptomatic. She has noticed no new lymphadenopathy.  She denies any pain.  She has no neurologic complaints.  She has a good appetite and her weight is stable.  She denies any fevers, night sweats, or weight loss.  She denies any chest pain, shortness of breath, cough, or hemoptysis.  She denies any nausea, vomiting, constipation, or diarrhea. She has no urinary complaints.  Patient feels at her baseline offers no specific complaints today.  REVIEW OF SYSTEMS:   Review of Systems  Constitutional: Negative.  Negative for fever, malaise/fatigue and weight loss.   Respiratory: Negative.  Negative for cough, hemoptysis and shortness of breath.   Cardiovascular: Negative.  Negative for chest pain and leg swelling.  Gastrointestinal: Negative.  Negative for abdominal pain, blood in stool, melena, nausea and vomiting.  Genitourinary: Negative.  Negative for dysuria.  Musculoskeletal: Negative.  Negative for back pain, joint pain and neck pain.  Skin: Negative.  Negative for rash.  Neurological: Negative.  Negative for sensory change, focal weakness, weakness and headaches.  Psychiatric/Behavioral: Negative.  The patient is not nervous/anxious and does not have insomnia.    As per HPI. Otherwise, a complete review of systems is negative.   PAST MEDICAL HISTORY: Past Medical History:  Diagnosis Date  . Arthritis   . Cataract    bilateral  . Chicken pox   . Colon polyp   . 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 Hyperlipidemia   . Osteoporosis     PAST SURGICAL HISTORY: Past Surgical History:  Procedure Laterality Date  . AXILLARY LYMPH NODE DISSECTION Right 08/21/2016   Procedure: AXILLARY LYMPH NODE excision;  Surgeon: Leonie Green, MD;  Location: [facility] OR[facility];  Service: General;  Laterality: Right;  . CATARACT EXTRACTION, BILATERAL Bilateral   . JOINT REPLACEMENT Left   . PORTA CATH INSERTION N/A 09/16/2017   Procedure: PORTA CATH INSERTION;  Surgeon: Algernon Huxley, MD;  Location: [place] [facility];  Service: Cardiovascular;  Laterality: N/A;  . TONSILLECTOMY    . TOTAL HIP ARTHROPLASTY Left 1992    FAMILY HISTORY: Family History  Problem Relation Age of Onset  . Diabetes Sister   . Lung cancer Brother   . Diabetes Brother   . Basal cell carcinoma Daughter   . Breast cancer Paternal  Aunt     ADVANCED DIRECTIVES (Y/N):  N  HEALTH MAINTENANCE: Social History   Tobacco Use  . Smoking status: Never Smoker  . Smokeless tobacco: Never Used  Vaping Use  . Vaping Use: Never used  Substance Use Topics  .  Alcohol use: No  . Drug use: No     Colonoscopy:  PAP:  Bone density:  Lipid panel:  No Known Allergies  Current Outpatient Medications  Medication Sig Dispense Refill  . alendronate (FOSAMAX) 70 MG tablet TAKE 1 TABLET EVERY 7 DAYS TAKE WITH A FULL GLASS OF WATER. DO NOT LIE DOWN FOR THE NEXT 30 MIN.  3  . diclofenac sodium (VOLTAREN) 1 % GEL APPLY 2 GRAMS TOPICALLY 2 (TWO) TIMES DAILY    . lidocaine-prilocaine (EMLA) cream APPLY 1 APPLICATION TOPICALLY AS NEEDED. 30 g 1  . Multiple Vitamins-Minerals (CENTRUM SILVER PO) Take 1 tablet by mouth daily.    . pantoprazole (PROTONIX) 20 MG tablet Take 20 mg by mouth at bedtime.     . simvastatin (ZOCOR) 20 MG tablet TAKE 1 TABLET BY MOUTH EVERY DAY AT NIGHT    . traMADol-acetaminophen (ULTRACET) 37.5-325 MG tablet Take 1 tablet by mouth every 8 (eight) hours as needed.      No current facility-administered medications for this visit.   Facility-Administered Medications Ordered in Other Visits  Medication Dose Route Frequency Provider Last Rate Last Admin  . heparin lock flush 100 unit/mL  500 Units Intravenous Once Lloyd Huger, MD      . heparin lock flush 100 unit/mL  500 Units Intracatheter PRN Lloyd Huger, MD      . sodium chloride flush (NS) 0.9 % injection 10 mL  10 mL Intravenous PRN Lloyd Huger, MD   10 mL at 12/04/17 0901  . sodium chloride flush (NS) 0.9 % injection 10 mL  10 mL Intracatheter PRN Lloyd Huger, MD      . yttrium-90 injection 90.3 millicurie  00.9 millicurie Intravenous Once [name], MD        OBJECTIVE: Vitals:   06/28/20 1034  BP: (!) 141/67  Pulse: 85  Resp: 20  Temp: 98.3 F (36.8 C)  SpO2: 97%     Body mass index is 25.41 kg/m.    ECOG FS:0 - Asymptomatic  General: Well-developed, well-nourished, no acute distress. HEENT: Normocephalic. Neuro: Alert, answering all questions appropriately. Cranial nerves grossly intact. Psych: Normal affect.  LAB  RESULTS:  Lab Results  Component Value Date   NA 141 06/28/2020   K 3.9 06/28/2020   CL 106 06/28/2020   CO2 24 06/28/2020   GLUCOSE 101 (H) 06/28/2020   BUN 15 06/28/2020   CREATININE 0.68 06/28/2020   CALCIUM 8.8 (L) 06/28/2020   PROT 7.1 06/28/2020   ALBUMIN 4.2 06/28/2020   AST 22 06/28/2020   ALT 14 06/28/2020   ALKPHOS 67 06/28/2020   BILITOT 0.6 06/28/2020   GFRNONAA >60 06/28/2020   GFRAA >60 06/28/2020    Lab Results  Component Value Date   WBC 6.5 06/28/2020   NEUTROABS 3.4 06/28/2020   HGB 13.3 06/28/2020   HCT 39.7 06/28/2020   MCV 93.6 06/28/2020   PLT 174 06/28/2020     STUDIES: No results found.   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  PLAN:    1. Recurrent follicular lymphoma: CT scan results from December 23, 2019 reviewed independently with no obvious evidence of recurrent or progressive disease.  Patient received Zevalin on  July 30, 2017 with not much therapeutic effect.  She subsequently underwent cycles 5 of R-CHOP chemotherapy with Neulasta support completing on December 12, 2017.  Patient continues to be in complete remission.  Continue with yearly imaging.  Return to clinic in 6 months with repeat laboratory work and CT scans with follow-up 1 to 2 days later.    I spent a total of 20 minutes reviewing chart data, face-to-face evaluation with the patient, counseling and coordination of care as detailed above.    Patient expressed understanding and was in agreement with this plan. She also understands that She can call clinic at any time with any questions, concerns, or complaints.   Cancer Staging 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 [facility]) Staging form: Lymphoid Neoplasms, AJCC 6th Edition - Clinical stage from 08/27/2016: Stage IIE (lymphoma only) - Signed by Lloyd Huger, MD on 08/27/2016   Lloyd Huger, MD   06/28/2020 11:27 AM

## 2020-06-28 ENCOUNTER — Inpatient Hospital Stay: Payer: Medicare Other

## 2020-06-28 ENCOUNTER — Inpatient Hospital Stay: Payer: Medicare Other | Attending: Oncology | Admitting: Oncology

## 2020-06-28 ENCOUNTER — Encounter: Payer: Self-pay | Admitting: Oncology

## 2020-06-28 ENCOUNTER — Other Ambulatory Visit: Payer: Self-pay

## 2020-06-28 VITALS — BP 141/67 | HR 85 | Temp 98.3°F | Resp 20 | Wt 121.6 lb

## 2020-06-28 DIAGNOSIS — C8208 Follicular lymphoma grade I, lymph nodes of multiple sites: Secondary | ICD-10-CM

## 2020-06-28 LAB — COMPREHENSIVE METABOLIC PANEL
ALT: 14 U/L (ref 0–44)
AST: 22 U/L (ref 15–41)
Albumin: 4.2 g/dL (ref 3.5–5.0)
Alkaline Phosphatase: 67 U/L (ref 38–126)
Anion gap: 11 (ref 5–15)
BUN: 15 mg/dL (ref 8–23)
CO2: 24 mmol/L (ref 22–32)
Calcium: 8.8 mg/dL — ABNORMAL LOW (ref 8.9–10.3)
Chloride: 106 mmol/L (ref 98–111)
Creatinine, Ser: 0.68 mg/dL (ref 0.44–1.00)
GFR calc Af Amer: >60 mL/min (ref >60)
GFR calc non Af Amer: >60 mL/min (ref >60)
Glucose, Bld: 101 mg/dL — ABNORMAL HIGH (ref 70–99)
Potassium: 3.9 mmol/L (ref 3.5–5.1)
Sodium: 141 mmol/L (ref 135–145)
Total Bilirubin: 0.6 mg/dL (ref 0.3–1.2)
Total Protein: 7.1 g/dL (ref 6.5–8.1)

## 2020-06-28 LAB — CBC WITH DIFFERENTIAL/PLATELET
Abs Immature Granulocytes: 0.01 10*3/uL (ref 0.00–0.07)
Basophils Absolute: 0 10*3/uL (ref 0.0–0.1)
Basophils Relative: 1 %
Eosinophils Absolute: 0.1 10*3/uL (ref 0.0–0.5)
Eosinophils Relative: 1 %
HCT: 39.7 % (ref 36.0–46.0)
Hemoglobin: 13.3 g/dL (ref 12.0–15.0)
Immature Granulocytes: 0 %
Lymphocytes Relative: 33 %
Lymphs Abs: 2.1 10*3/uL (ref 0.7–4.0)
MCH: 31.4 pg (ref 26.0–34.0)
MCHC: 33.5 g/dL (ref 30.0–36.0)
MCV: 93.6 fL (ref 80.0–100.0)
Monocytes Absolute: 0.8 10*3/uL (ref 0.1–1.0)
Monocytes Relative: 12 %
Neutro Abs: 3.4 10*3/uL (ref 1.7–7.7)
Neutrophils Relative %: 53 %
Platelets: 174 10*3/uL (ref 150–400)
RBC: 4.24 MIL/uL (ref 3.87–5.11)
RDW: 12.6 % (ref 11.5–15.5)
WBC: 6.5 10*3/uL (ref 4.0–10.5)
nRBC: 0 % (ref 0.0–0.2)

## 2020-06-28 MED ORDER — SODIUM CHLORIDE 0.9% FLUSH
10.0000 mL | Freq: Once | INTRAVENOUS | Status: AC
  Administered 2020-06-28: 10 mL via INTRAVENOUS
  Filled 2020-06-28: qty 10

## 2020-06-28 MED ORDER — HEPARIN SOD (PORK) LOCK FLUSH 100 UNIT/ML IV SOLN
500.0000 [IU] | Freq: Once | INTRAVENOUS | Status: AC
  Administered 2020-06-28: 500 [IU] via INTRAVENOUS
  Filled 2020-06-28: qty 5

## 2020-06-30 ENCOUNTER — Other Ambulatory Visit: Payer: Self-pay

## 2020-06-30 ENCOUNTER — Ambulatory Visit (INDEPENDENT_AMBULATORY_CARE_PROVIDER_SITE_OTHER): Payer: Medicare Other | Admitting: Urology

## 2020-06-30 ENCOUNTER — Encounter: Payer: Self-pay | Admitting: Urology

## 2020-06-30 VITALS — BP 147/84 | HR 108 | Ht <= 58 in | Wt 122.0 lb

## 2020-06-30 DIAGNOSIS — R3121 Asymptomatic microscopic hematuria: Secondary | ICD-10-CM

## 2020-06-30 DIAGNOSIS — R310 Gross hematuria: Secondary | ICD-10-CM

## 2020-06-30 NOTE — Progress Notes (Signed)
Cystoscopy Procedure Note:  Indication: Microscopic hematuria  After informed consent and discussion of the procedure and its risks, Veronica Boyd was positioned and prepped in the standard fashion. Cystoscopy was performed with a flexible cystoscope. The urethra, bladder neck and entire bladder was visualized in a standard fashion. The ureteral orifices were visualized in their normal location and orientation.  Bladder mucosa grossly normal throughout.  Imaging: CT abdomen and pelvis with contrast from February 2021 with no urologic abnormalities  Findings: Normal cystoscopy  Assessment and Plan: Follow-up as needed  Nickolas Madrid, MD 06/30/2020

## 2020-07-27 ENCOUNTER — Inpatient Hospital Stay: Payer: Medicare Other | Attending: Oncology

## 2020-07-27 ENCOUNTER — Other Ambulatory Visit: Payer: Self-pay

## 2020-07-27 DIAGNOSIS — C8208 Follicular lymphoma grade I, lymph nodes of multiple sites: Secondary | ICD-10-CM | POA: Insufficient documentation

## 2020-07-27 DIAGNOSIS — Z452 Encounter for adjustment and management of vascular access device: Secondary | ICD-10-CM | POA: Insufficient documentation

## 2020-07-27 DIAGNOSIS — Z95828 Presence of other vascular implants and grafts: Secondary | ICD-10-CM

## 2020-07-27 MED ORDER — HEPARIN SOD (PORK) LOCK FLUSH 100 UNIT/ML IV SOLN
500.0000 [IU] | Freq: Once | INTRAVENOUS | Status: AC
  Administered 2020-07-27: 500 [IU] via INTRAVENOUS
  Filled 2020-07-27: qty 5

## 2020-07-27 MED ORDER — HEPARIN SOD (PORK) LOCK FLUSH 100 UNIT/ML IV SOLN
INTRAVENOUS | Status: AC
  Filled 2020-07-27: qty 5

## 2020-07-27 MED ORDER — SODIUM CHLORIDE 0.9% FLUSH
10.0000 mL | Freq: Once | INTRAVENOUS | Status: AC
  Administered 2020-07-27: 10 mL via INTRAVENOUS
  Filled 2020-07-27: qty 10

## 2020-09-14 ENCOUNTER — Inpatient Hospital Stay: Payer: Medicare Other | Attending: Oncology

## 2020-09-14 DIAGNOSIS — C8208 Follicular lymphoma grade I, lymph nodes of multiple sites: Secondary | ICD-10-CM | POA: Insufficient documentation

## 2020-09-14 DIAGNOSIS — Z95828 Presence of other vascular implants and grafts: Secondary | ICD-10-CM

## 2020-09-14 DIAGNOSIS — Z452 Encounter for adjustment and management of vascular access device: Secondary | ICD-10-CM | POA: Diagnosis not present

## 2020-09-14 MED ORDER — HEPARIN SOD (PORK) LOCK FLUSH 100 UNIT/ML IV SOLN
500.0000 [IU] | Freq: Once | INTRAVENOUS | Status: AC
  Administered 2020-09-14: 500 [IU] via INTRAVENOUS
  Filled 2020-09-14: qty 5

## 2020-09-14 MED ORDER — SODIUM CHLORIDE 0.9% FLUSH
10.0000 mL | Freq: Once | INTRAVENOUS | Status: AC
  Administered 2020-09-14: 10 mL via INTRAVENOUS
  Filled 2020-09-14: qty 10

## 2020-09-15 ENCOUNTER — Other Ambulatory Visit
Admission: RE | Admit: 2020-09-15 | Discharge: 2020-09-15 | Disposition: A | Discharge status: Home / Self Care | Payer: Medicare Other | Source: Ambulatory Visit | Attending: Gastroenterology | Admitting: Gastroenterology

## 2020-09-15 ENCOUNTER — Other Ambulatory Visit: Payer: Self-pay

## 2020-09-15 DIAGNOSIS — Z20822 Contact with and (suspected) exposure to covid-19: Secondary | ICD-10-CM | POA: Diagnosis not present

## 2020-09-15 DIAGNOSIS — Z01812 Encounter for preprocedural laboratory examination: Secondary | ICD-10-CM | POA: Diagnosis present

## 2020-09-15 LAB — SARS CORONAVIRUS 2 (TAT 6-24 HRS): SARS Coronavirus 2: NEGATIVE

## 2020-09-16 ENCOUNTER — Encounter: Payer: Self-pay | Admitting: *Deleted

## 2020-09-19 ENCOUNTER — Encounter
Admission: RE | Disposition: A | Discharge status: Home / Self Care | Payer: Self-pay | Source: Home / Self Care | Attending: Gastroenterology

## 2020-09-19 ENCOUNTER — Encounter: Payer: Self-pay | Admitting: Gastroenterology

## 2020-09-19 ENCOUNTER — Ambulatory Visit
Admission: RE | Admit: 2020-09-19 | Discharge: 2020-09-19 | Disposition: A | Discharge status: Home / Self Care | Payer: Medicare Other | Attending: Gastroenterology | Admitting: Gastroenterology

## 2020-09-19 ENCOUNTER — Ambulatory Visit: Payer: Medicare Other | Admitting: Certified Registered Nurse Anesthetist

## 2020-09-19 ENCOUNTER — Other Ambulatory Visit: Payer: Self-pay

## 2020-09-19 DIAGNOSIS — Z8572 Personal history of non-Hodgkin lymphomas: Secondary | ICD-10-CM | POA: Diagnosis not present

## 2020-09-19 DIAGNOSIS — Z79899 Other long term (current) drug therapy: Secondary | ICD-10-CM | POA: Diagnosis not present

## 2020-09-19 DIAGNOSIS — Z791 Long term (current) use of non-steroidal anti-inflammatories (NSAID): Secondary | ICD-10-CM | POA: Insufficient documentation

## 2020-09-19 DIAGNOSIS — K5730 Diverticulosis of large intestine without perforation or abscess without bleeding: Secondary | ICD-10-CM | POA: Insufficient documentation

## 2020-09-19 DIAGNOSIS — D375 Neoplasm of uncertain behavior of rectum: Secondary | ICD-10-CM | POA: Diagnosis not present

## 2020-09-19 DIAGNOSIS — E785 Hyperlipidemia, unspecified: Secondary | ICD-10-CM | POA: Insufficient documentation

## 2020-09-19 DIAGNOSIS — K529 Noninfective gastroenteritis and colitis, unspecified: Secondary | ICD-10-CM | POA: Diagnosis not present

## 2020-09-19 DIAGNOSIS — Z86010 Personal history of colonic polyps: Secondary | ICD-10-CM | POA: Insufficient documentation

## 2020-09-19 HISTORY — PX: COLONOSCOPY: SHX5424

## 2020-09-19 SURGERY — COLONOSCOPY
Anesthesia: General

## 2020-09-19 MED ORDER — PROPOFOL 10 MG/ML IV BOLUS
INTRAVENOUS | Status: DC | PRN
  Administered 2020-09-19: 70 mg via INTRAVENOUS

## 2020-09-19 MED ORDER — SODIUM CHLORIDE 0.9 % IV SOLN
INTRAVENOUS | Status: DC
  Administered 2020-09-19: 20 mL/h via INTRAVENOUS

## 2020-09-19 MED ORDER — LIDOCAINE HCL (PF) 2 % IJ SOLN
INTRAMUSCULAR | Status: AC
  Filled 2020-09-19: qty 5

## 2020-09-19 MED ORDER — LIDOCAINE HCL (CARDIAC) PF 100 MG/5ML IV SOSY
PREFILLED_SYRINGE | INTRAVENOUS | Status: DC | PRN
  Administered 2020-09-19: 50 mg via INTRAVENOUS

## 2020-09-19 MED ORDER — PROPOFOL 500 MG/50ML IV EMUL
INTRAVENOUS | Status: DC | PRN
  Administered 2020-09-19: 150 ug/kg/min via INTRAVENOUS

## 2020-09-19 MED ORDER — PROPOFOL 500 MG/50ML IV EMUL
INTRAVENOUS | Status: AC
  Filled 2020-09-19: qty 50

## 2020-09-19 MED ORDER — PHENYLEPHRINE HCL (PRESSORS) 10 MG/ML IV SOLN
INTRAVENOUS | Status: DC | PRN
  Administered 2020-09-19: 50 ug via INTRAVENOUS
  Administered 2020-09-19: 100 ug via INTRAVENOUS

## 2020-09-19 NOTE — Anesthesia Postprocedure Evaluation (Signed)
Anesthesia Post Note  Patient: Veronica Boyd  Procedure(s) Performed: COLONOSCOPY (N/A )  Patient location during evaluation: PACU Anesthesia Type: General Level of consciousness: awake and alert Pain management: pain level controlled Vital Signs Assessment: post-procedure vital signs reviewed and stable Respiratory status: spontaneous breathing, nonlabored ventilation and respiratory function stable Cardiovascular status: blood pressure returned to baseline and stable Postop Assessment: no apparent nausea or vomiting Anesthetic complications: no   No complications documented.   Last Vitals:  Vitals:   09/19/20 1119 09/19/20 1129  BP: (!) 94/40 (!) 105/58  Pulse: 75   Resp: (!) 26   Temp: 36.6 C   SpO2: 98%     Last Pain:  Vitals:   09/19/20 1139  TempSrc:   PainSc: 0-No pain                 Brett Canales Chaise Mahabir

## 2020-09-19 NOTE — Interval H&P Note (Signed)
History and Physical Interval Note:  09/19/2020 10:40 AM  Veronica Boyd  has presented today for surgery, with the diagnosis of DIARRHEA.  The various methods of treatment have been discussed with the patient and family. After consideration of risks, benefits and other options for treatment, the patient has consented to  Procedure(s): COLONOSCOPY (N/A) as a surgical intervention.  The patient's history has been reviewed, patient examined, no change in status, stable for surgery.  I have reviewed the patient's chart and labs.  Questions were answered to the patient's satisfaction.     Lesly Rubenstein  Ok to proceed with colonoscopy

## 2020-09-19 NOTE — Anesthesia Preprocedure Evaluation (Addendum)
Anesthesia Evaluation  Patient identified by MRN, date of birth, ID band Patient awake    Reviewed: Allergy & Precautions, H&P , NPO status , Patient's Chart, lab work & pertinent test results  History of Anesthesia Complications Negative for: history of anesthetic complications  Airway Mallampati: II  TM Distance: >3 FB     Dental   Pulmonary neg pulmonary ROS, neg sleep apnea, neg COPD,    breath sounds clear to auscultation       Cardiovascular (-) angina(-) Past MI and (-) Cardiac Stents negative cardio ROS  (-) dysrhythmias  Rhythm:regular Rate:Normal     Neuro/Psych negative neurological ROS  negative psych ROS   GI/Hepatic Neg liver ROS, GERD  ,  Endo/Other  negative endocrine ROS  Renal/GU negative Renal ROS  negative genitourinary   Musculoskeletal   Abdominal   Peds  Hematology negative hematology ROS (+)   Anesthesia Other Findings Past Medical History: No date: Arthritis No date: Cataract     Comment:  bilateral No date: Chicken pox No date: Colon polyp 08/2016: Follicular lymphoma ([place])     Comment:  lymph nodes  No date: Hyperlipidemia No date: Osteoporosis  Past Surgical History: 08/21/2016: AXILLARY LYMPH NODE DISSECTION; Right     Comment:  Procedure: AXILLARY LYMPH NODE excision;  Surgeon:               Leonie Green, MD;  Location: [facility] ORS;  Service:               General;  Laterality: Right; No date: CATARACT EXTRACTION, BILATERAL; Bilateral No date: EYE SURGERY No date: JOINT REPLACEMENT; Left 09/16/2017: PORTA CATH INSERTION; N/A     Comment:  Procedure: PORTA CATH INSERTION;  Surgeon: Algernon Huxley,              MD;  Location: [place] [facility];  Service:               Cardiovascular;  Laterality: N/A; No date: TONSILLECTOMY 1992: TOTAL HIP ARTHROPLASTY; Left  BMI    Body Mass Index: 25.50 kg/m      Reproductive/Obstetrics negative OB ROS                             Anesthesia Physical Anesthesia Plan  ASA: II  Anesthesia Plan: General   Post-op Pain Management:    Induction:   PONV Risk Score and Plan: Propofol infusion and TIVA  Airway Management Planned: Nasal Cannula  Additional Equipment:   Intra-op Plan:   Post-operative Plan:   Informed Consent: I have reviewed the patients History and Physical, chart, labs and discussed the procedure including the risks, benefits and alternatives for the proposed anesthesia with the patient or authorized representative who has indicated his/her understanding and acceptance.     Dental Advisory Given  Plan Discussed with: Anesthesiologist, CRNA and Surgeon  Anesthesia Plan Comments:         Anesthesia Quick Evaluation

## 2020-09-19 NOTE — Op Note (Signed)
[facility] Patient Name: Veronica Boyd Procedure Date: 09/19/2020 10:37 AM MRN: [id number] Account #: [id number] Date of Birth: June 14, 1946 Admit Type: Outpatient Age: 74 Room: [facility] ENDO ROOM 3 Gender: Female Note Status: Finalized Procedure:             Colonoscopy Indications:           Chronic diarrhea Providers:             Andrey Farmer MD, MD Medicines:             Monitored Anesthesia Care Complications:         No immediate complications. Estimated blood loss:                         Minimal. Procedure:             Pre-Anesthesia Assessment:                        - Prior to the procedure, a History and Physical was                         performed, and patient medications and allergies were                         reviewed. The patient is competent. The risks and                         benefits of the procedure and the sedation options and                         risks were discussed with the patient. All questions                         were answered and informed consent was obtained.                         Patient identification and proposed procedure were                         verified by the physician, the nurse, the anesthetist                         and the technician in the endoscopy suite. Mental                         Status Examination: alert and oriented. Airway                         Examination: normal oropharyngeal airway and neck                         mobility. Respiratory Examination: clear to                         auscultation. CV Examination: normal. Prophylactic                         Antibiotics: The patient does not require prophylactic  antibiotics. Prior Anticoagulants: The patient has                         taken no previous anticoagulant or antiplatelet                         agents. ASA Grade Assessment: II - A patient with mild                         systemic disease.  After reviewing the risks and                         benefits, the patient was deemed in satisfactory                         condition to undergo the procedure. The anesthesia                         plan was to use monitored anesthesia care (MAC).                         Immediately prior to administration of medications,                         the patient was re-assessed for adequacy to receive                         sedatives. The heart rate, respiratory rate, oxygen                         saturations, blood pressure, adequacy of pulmonary                         ventilation, and response to care were monitored                         throughout the procedure. The physical status of the                         patient was re-assessed after the procedure.                        After obtaining informed consent, the colonoscope was                         passed under direct vision. Throughout the procedure,                         the patient's blood pressure, pulse, and oxygen                         saturations were monitored continuously. The                         Colonoscope was introduced through the anus and                         advanced to the the terminal ileum. The colonoscopy  was performed without difficulty. The patient                         tolerated the procedure well. The quality of the bowel                         preparation was good. Findings:      The perianal and digital rectal examinations were normal.      The terminal ileum appeared normal.      Biopsies for histology were taken with a cold forceps from the entire       colon for evaluation of microscopic colitis.      Many small-mouthed diverticula were found in the sigmoid colon.      A frond-like/villous non-obstructing large mass was found in the rectum.       The mass was partially circumferential (involving one-half of the lumen       circumference). No bleeding was  present. This was biopsied with a cold       forceps for histology. Estimated blood loss was minimal. Impression:            - The examined portion of the ileum was normal.                        - Diverticulosis in the sigmoid colon.                        - Likely malignant tumor in the rectum. Biopsied.                        - Biopsies were taken with a cold forceps from the                         entire colon for evaluation of microscopic colitis. Recommendation:        - Discharge patient to home.                        - Resume previous diet.                        - Continue present medications.                        - Await pathology results.                        - Return to referring physician as previously                         scheduled. Procedure Code(s):     --- Professional ---                        [id number], Colonoscopy, flexible; with biopsy, single or                         multiple Diagnosis Code(s):     --- Professional ---                        D49.0, Neoplasm of unspecified behavior of digestive  system                        K52.9, Noninfective gastroenteritis and colitis,                         unspecified                        K57.30, Diverticulosis of large intestine without                         perforation or abscess without bleeding CPT copyright 2019 American Medical [employer]. All rights reserved. The codes documented in this report are preliminary and upon coder review may  be revised to meet current compliance requirements. Andrey Farmer, MD Andrey Farmer MD, MD 09/19/2020 11:22:16 AM Number of Addenda: 0 Note Initiated On: 09/19/2020 10:37 AM Scope Withdrawal Time: 0 hours 12 minutes 45 seconds  Total Procedure Duration: 0 hours 18 minutes 18 seconds  Estimated Blood Loss:  Estimated blood loss was minimal.      [facility]

## 2020-09-19 NOTE — Transfer of Care (Signed)
Immediate Anesthesia Transfer of Care Note  Patient: Veronica Boyd  Procedure(s) Performed: COLONOSCOPY (N/A )  Patient Location: Endoscopy Unit  Anesthesia Type:General  Level of Consciousness: drowsy  Airway & Oxygen Therapy: Patient Spontanous Breathing  Post-op Assessment: Report given to RN and Post -op Vital signs reviewed and stable  Post vital signs: Reviewed and stable  Last Vitals:  Vitals Value Taken Time  BP 94/40 09/19/20 1120  Temp 36.6 C 09/19/20 1119  Pulse 73 09/19/20 1121  Resp 24 09/19/20 1121  SpO2 98 % 09/19/20 1121  Vitals shown include unvalidated device data.  Last Pain:  Vitals:   09/19/20 1119  TempSrc: Temporal  PainSc: Asleep         Complications: No complications documented.

## 2020-09-19 NOTE — H&P (Signed)
Outpatient short stay form Pre-procedure 09/19/2020 10:37 AM Veronica Miyamoto MD, MPH  Primary Physician: Dr. Ginette Pitman  Reason for visit:  Chronic Diarrhea  History of present illness:   74 y/o lady with history of lymphoma treated with RCHOP who developed diarrhea post treatment with negative stool studies. Here for colonoscopy for biopsies to rule out microscopic colitis. No family history of GI malignancies. No abdominal surgeries. No blood thinners.    Current Facility-Administered Medications:  .  0.9 %  sodium chloride infusion, , Intravenous, Continuous, Samyukta Cura, Hilton Cork, MD, Last Rate: 20 mL/hr at 09/19/20 1024, 20 mL/hr at 09/19/20 1024  Facility-Administered Medications Ordered in Other Encounters:  .  heparin lock flush 100 unit/mL, 500 Units, Intravenous, Once, Finnegan, Kathlene November, MD .  heparin lock flush 100 unit/mL, 500 Units, Intracatheter, PRN, Grayland Ormond, Kathlene November, MD .  sodium chloride flush (NS) 0.9 % injection 10 mL, 10 mL, Intravenous, PRN, Lloyd Huger, MD, 10 mL at 12/04/17 0901 .  sodium chloride flush (NS) 0.9 % injection 10 mL, 10 mL, Intracatheter, PRN, Grayland Ormond, Kathlene November, MD .  yttrium-90 injection 16.1 millicurie, 09.6 millicurie, Intravenous, Once, Chrystal, Glenn, MD  Medications Prior to Admission  Medication Sig Dispense Refill Last Dose  . alendronate (FOSAMAX) 70 MG tablet TAKE 1 TABLET EVERY 7 DAYS TAKE WITH A FULL GLASS OF WATER. DO NOT LIE DOWN FOR THE NEXT 30 MIN.  3 Past Week at Unknown time  . diclofenac sodium (VOLTAREN) 1 % GEL APPLY 2 GRAMS TOPICALLY 2 (TWO) TIMES DAILY   Past Week at Unknown time  . lidocaine-prilocaine (EMLA) cream APPLY 1 APPLICATION TOPICALLY AS NEEDED. 30 g 1 Past Week at Unknown time  . Multiple Vitamins-Minerals (CENTRUM SILVER PO) Take 1 tablet by mouth daily.   Past Week at Unknown time  . pantoprazole (PROTONIX) 20 MG tablet Take 20 mg by mouth at bedtime.    09/18/2020 at Unknown time  . simvastatin (ZOCOR)  20 MG tablet TAKE 1 TABLET BY MOUTH EVERY DAY AT NIGHT   Past Week at Unknown time  . tizanidine (ZANAFLEX) 2 MG capsule Take 2 mg by mouth 3 (three) times daily.   Past Week at Unknown time  . traMADol-acetaminophen (ULTRACET) 37.5-325 MG tablet Take 1 tablet by mouth every 8 (eight) hours as needed.    Past Week at Unknown time     No Known Allergies   Past Medical History:  Diagnosis Date  . Arthritis   . Cataract    bilateral  . Chicken pox   . Colon polyp   . 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 Hyperlipidemia   . Osteoporosis     Review of systems:  Otherwise negative.    Physical Exam  Gen: Alert, oriented. Appears stated age.  HEENT: PERRLA. Lungs: No respiratory distress CV: RRR Abd: soft, benign, no masses.  Ext: No edema.     Planned procedures: Proceed with colonoscopy. The patient understands the nature of the planned procedure, indications, risks, alternatives and potential complications including but not limited to bleeding, infection, perforation, damage to internal organs and possible oversedation/side effects from anesthesia. The patient agrees and gives consent to proceed.  Please refer to procedure notes for findings, recommendations and patient disposition/instructions.     Veronica Miyamoto MD, MPH Gastroenterology 09/19/2020  10:37 AM

## 2020-09-20 LAB — SURGICAL PATHOLOGY

## 2020-10-11 DIAGNOSIS — K6289 Other specified diseases of anus and rectum: Secondary | ICD-10-CM | POA: Insufficient documentation

## 2020-10-25 ENCOUNTER — Telehealth: Payer: Self-pay

## 2020-10-25 NOTE — Telephone Encounter (Signed)
Arranged medical oncology appointment with Dr. Grayland Ormond same day as Dr. Baruch Gouty. Called and left voicemail with daughter, Shirlean Mylar, to return call for updated appointments.

## 2020-10-26 ENCOUNTER — Telehealth: Payer: Self-pay

## 2020-10-26 NOTE — Telephone Encounter (Signed)
Called and spoke with daughter, Shirlean Mylar. Went over upcoming appointments for port flush and medical and radiation oncology. She states the surgeon plans on seeing her back once she has completed her TNT.

## 2020-10-30 NOTE — Progress Notes (Signed)
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  INTERVAL HISTORY: Patient returns to clinic today as an add-on to discuss her new diagnosis of rectal cancer and treatment planning.  Patient states she has been having diarrhea for the last 6 to 8 months, but denies any bleeding or pain.  She also endorses approximately 10 pound weight loss.  Subsequent work-up included a colonoscopy as well as imaging at [facility] which revealed the above-stated rectal cancer.  Patient has multiple pulmonary nodules that are highly suspicious, but not diagnostic.  She otherwise feels well.  She has noticed no new lymphadenopathy.  She denies any pain.  She has no neurologic complaints. She denies any fevers, night sweats, or weight loss.  She denies any chest pain, shortness of breath, cough, or hemoptysis.  She denies any nausea, vomiting, or constipation.  Patient admitted to some hematuria, but none recently.  She offers no further specific complaints today.  REVIEW OF SYSTEMS:   Review of Systems  Constitutional: Negative.  Negative for fever, malaise/fatigue and weight loss.  Respiratory: Negative.  Negative for cough, hemoptysis and shortness of breath.   Cardiovascular: Negative.  Negative for chest pain and leg swelling.  Gastrointestinal: Negative.  Negative for abdominal pain, blood in stool, melena, nausea and vomiting.  Genitourinary: Negative.  Negative for  dysuria.  Musculoskeletal: Negative.  Negative for back pain, joint pain and neck pain.  Skin: Negative.  Negative for rash.  Neurological: Negative.  Negative for sensory change, focal weakness, weakness and headaches.  Psychiatric/Behavioral: Negative.  The patient is not nervous/anxious and does not have insomnia.    As per HPI. Otherwise, a complete review of systems is negative.   PAST MEDICAL HISTORY: Past Medical History:  Diagnosis Date  . Arthritis   . Cataract    bilateral  . Chicken pox   . Colon polyp   . 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 Hyperlipidemia   . Osteoporosis     PAST SURGICAL HISTORY: Past Surgical History:  Procedure Laterality Date  . AXILLARY LYMPH NODE DISSECTION Right 08/21/2016   Procedure: AXILLARY LYMPH NODE excision;  Surgeon: Leonie Green, MD;  Location: [facility] ORS;  Service: General;  Laterality: Right;  . CATARACT EXTRACTION, BILATERAL Bilateral   . COLONOSCOPY N/A 09/19/2020   Procedure: COLONOSCOPY;  Surgeon: Lesly Rubenstein, MD;  Location: [facility] ENDOSCOPY;  Service: Endoscopy;  Laterality: N/A;  . EYE SURGERY    . JOINT REPLACEMENT Left   . PORTA CATH INSERTION N/A 09/16/2017   Procedure: PORTA CATH INSERTION;  Surgeon: Algernon Huxley, MD;  Location: [facility];  Service: Cardiovascular;  Laterality: N/A;  . TONSILLECTOMY    . TOTAL HIP ARTHROPLASTY Left 1992    FAMILY HISTORY: Family History  Problem Relation Age of Onset  . Diabetes Sister   . Lung cancer Brother   . Diabetes Brother   . Basal cell carcinoma Daughter   . Breast cancer Paternal Aunt     ADVANCED DIRECTIVES (Y/N):  N  HEALTH MAINTENANCE: Social History   Tobacco Use  . Smoking status: Never Smoker  . Smokeless tobacco: Never Used  Vaping Use  . Vaping Use: Never used  Substance Use Topics  . Alcohol use: No  . Drug use: No     Colonoscopy:  PAP:  Bone density:  Lipid panel:  No Known Allergies  Current  Outpatient Medications  Medication Sig Dispense Refill  . alendronate (FOSAMAX) 70 MG tablet TAKE 1 TABLET EVERY 7 DAYS TAKE WITH A FULL GLASS OF WATER. DO NOT LIE DOWN FOR THE NEXT 30 MIN.  3  . diclofenac sodium (VOLTAREN) 1 % GEL APPLY 2 GRAMS TOPICALLY 2 (TWO) TIMES DAILY    . lidocaine-prilocaine (EMLA) cream APPLY 1 APPLICATION TOPICALLY AS NEEDED. 30 g 1  . lidocaine-prilocaine (EMLA) cream Apply to affected area once 30 g 3  . Multiple Vitamins-Minerals (CENTRUM SILVER PO) Take 1 tablet by mouth daily.    . ondansetron (ZOFRAN) 8 MG tablet Take 1 tablet (8 mg total) by mouth 2 (two) times daily as needed for refractory nausea / vomiting. Start on day 3 after chemotherapy. 60 tablet 1  . pantoprazole (PROTONIX) 20 MG tablet Take 20 mg by mouth at bedtime.     . prochlorperazine (COMPAZINE) 10 MG tablet Take 1 tablet (10 mg total) by mouth every 6 (six) hours as needed (Nausea or vomiting). 60 tablet 1  . simvastatin (ZOCOR) 20 MG tablet TAKE 1 TABLET BY MOUTH EVERY DAY AT NIGHT    . tizanidine (ZANAFLEX) 2 MG capsule Take 2 mg by mouth 3 (three) times daily.    . traMADol-acetaminophen (ULTRACET) 37.5-325 MG tablet Take 1 tablet by mouth every 8 (eight) hours as needed.      No current facility-administered medications for this visit.   Facility-Administered Medications Ordered in Other Visits  Medication Dose Route Frequency Provider Last Rate Last Admin  . heparin lock flush 100 unit/mL  500 Units Intravenous Once Lloyd Huger, MD      . heparin lock flush 100 unit/mL  500 Units Intracatheter PRN Lloyd Huger, MD      . sodium chloride flush (NS) 0.9 % injection 10 mL  10 mL Intravenous PRN Lloyd Huger, MD   10 mL at 12/04/17 0901  . sodium chloride flush (NS) 0.9 % injection 10 mL  10 mL Intracatheter PRN Lloyd Huger, MD      . yttrium-90 injection 40.9 millicurie  81.1 millicurie Intravenous Once [name], MD        OBJECTIVE: Vitals:    11/02/20 1104  BP: (!) 166/80  Pulse: 83  Temp: (!) 96.5 F (35.8 C)     Body mass index is 24.06 kg/m.    ECOG FS:0 - Asymptomatic  General: Thin, no acute distress. Eyes: Pink conjunctiva, anicteric sclera. HEENT: Normocephalic, moist mucous membranes. Lungs: No audible wheezing or coughing. Heart: Regular rate and rhythm. Abdomen: Soft, nontender, no obvious distention. Musculoskeletal: No edema, cyanosis, or clubbing. Neuro: Alert, answering all questions appropriately. Cranial nerves grossly intact. Skin: No rashes or petechiae noted. Psych: Normal affect. Lymphatics: No cervical, supraclavicular, or axillary lymphadenopathy.  LAB RESULTS:  Lab Results  Component Value Date   NA 141 06/28/2020   K 3.9 06/28/2020   CL 106 06/28/2020   CO2 24 06/28/2020   GLUCOSE 101 (H) 06/28/2020   BUN 15 06/28/2020   CREATININE 0.68 06/28/2020   CALCIUM 8.8 (L) 06/28/2020   PROT 7.1 06/28/2020   ALBUMIN 4.2  06/28/2020   AST 22 06/28/2020   ALT 14 06/28/2020   ALKPHOS 67 06/28/2020   BILITOT 0.6 06/28/2020   GFRNONAA >60 06/28/2020   GFRAA >60 06/28/2020    Lab Results  Component Value Date   WBC 6.5 06/28/2020   NEUTROABS 3.4 06/28/2020   HGB 13.3 06/28/2020   HCT 39.7 06/28/2020   MCV 93.6 06/28/2020   PLT 174 06/28/2020     STUDIES: No results found.   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  PLAN:    1.  Possible stage IV rectal cancer: Biopsy confirmed rectal cancer.  MRI completed at [facility] on October 11, 2020 revealed extension of the tumor beyond the wall into the rectovaginal recess with suspected invasion of the vaginal wall posteriorly.  Tumor also appears to involve the internal anal sphincter.  There are also numerous prominent presacral and mesorectal lymph nodes highly suspicious for malignancy.  CT scan results from [facility] on October 13, 2020 revealed multiple pulmonary  nodules in the right lower lobe highly suspicious for metastasis the index right lower lobe nodule measures 9 mm a second nodule in the right lower lobe measures 6 mm.  We will get PET scan to complete the staging work-up and to further evaluate patient's pulmonary nodules.  It is unclear if she is a surgical candidate at this time, but will plan on neoadjuvant FOLFOX for 6 cycles and then reconsider surgery and radiation.  Return to clinic in 2 weeks for further evaluation and initiation of treatment. 2. Recurrent follicular lymphoma: CT scan results from December 23, 2019 reviewed independently with no obvious evidence of recurrent or progressive disease.  Recent imaging from [facility] as above with no evidence of lymphoma.  Patient received Zevalin on July 30, 2017 with not much therapeutic effect.  She subsequently underwent cycles 5 of R-CHOP chemotherapy with Neulasta support completing on December 12, 2017.  Patient continues to be in complete remission.   I spent a total of 30 minutes reviewing chart data, face-to-face evaluation with the patient, counseling and coordination of care as detailed above.    Patient expressed understanding and was in agreement with this plan. She also understands that She can call clinic at any time with any questions, concerns, or complaints.   Cancer Staging 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 [facility]) Staging form: Lymphoid Neoplasms, AJCC 6th Edition - Clinical stage from 08/27/2016: Stage IIE (lymphoma only) - Signed by Lloyd Huger, MD on 08/27/2016  Rectal cancer [facility]) Staging form: Colon and Rectum, AJCC 8th Edition - Clinical: Stage IVA Larwance Sachs, Noelle.Marry) - Signed by Lloyd Huger, MD on 11/02/2020   Lloyd Huger, MD   11/02/2020 4:28 PM

## 2020-11-02 ENCOUNTER — Ambulatory Visit
Admission: RE | Admit: 2020-11-02 | Discharge: 2020-11-02 | Disposition: A | Discharge status: Home / Self Care | Payer: Medicare Other | Source: Ambulatory Visit | Attending: Radiation Oncology | Admitting: Radiation Oncology

## 2020-11-02 ENCOUNTER — Inpatient Hospital Stay: Payer: Medicare Other | Attending: Oncology

## 2020-11-02 ENCOUNTER — Inpatient Hospital Stay (HOSPITAL_BASED_OUTPATIENT_CLINIC_OR_DEPARTMENT_OTHER): Payer: Medicare Other | Admitting: Oncology

## 2020-11-02 ENCOUNTER — Encounter: Payer: Self-pay | Admitting: Oncology

## 2020-11-02 ENCOUNTER — Other Ambulatory Visit: Payer: Self-pay

## 2020-11-02 VITALS — BP 166/80 | HR 83 | Temp 96.5°F | Resp 18 | Wt 115.1 lb

## 2020-11-02 VITALS — BP 166/80 | HR 83 | Temp 96.5°F | Wt 115.1 lb

## 2020-11-02 DIAGNOSIS — E785 Hyperlipidemia, unspecified: Secondary | ICD-10-CM | POA: Diagnosis not present

## 2020-11-02 DIAGNOSIS — Z86010 Personal history of colonic polyps: Secondary | ICD-10-CM | POA: Insufficient documentation

## 2020-11-02 DIAGNOSIS — Z9221 Personal history of antineoplastic chemotherapy: Secondary | ICD-10-CM | POA: Insufficient documentation

## 2020-11-02 DIAGNOSIS — C20 Malignant neoplasm of rectum: Secondary | ICD-10-CM

## 2020-11-02 DIAGNOSIS — Z8572 Personal history of non-Hodgkin lymphomas: Secondary | ICD-10-CM | POA: Diagnosis not present

## 2020-11-02 DIAGNOSIS — C8208 Follicular lymphoma grade I, lymph nodes of multiple sites: Secondary | ICD-10-CM | POA: Diagnosis not present

## 2020-11-02 DIAGNOSIS — Z79899 Other long term (current) drug therapy: Secondary | ICD-10-CM | POA: Insufficient documentation

## 2020-11-02 DIAGNOSIS — M129 Arthropathy, unspecified: Secondary | ICD-10-CM | POA: Diagnosis not present

## 2020-11-02 DIAGNOSIS — R197 Diarrhea, unspecified: Secondary | ICD-10-CM | POA: Diagnosis not present

## 2020-11-02 DIAGNOSIS — K6289 Other specified diseases of anus and rectum: Secondary | ICD-10-CM

## 2020-11-02 DIAGNOSIS — Z95828 Presence of other vascular implants and grafts: Secondary | ICD-10-CM

## 2020-11-02 DIAGNOSIS — R918 Other nonspecific abnormal finding of lung field: Secondary | ICD-10-CM | POA: Diagnosis not present

## 2020-11-02 DIAGNOSIS — M810 Age-related osteoporosis without current pathological fracture: Secondary | ICD-10-CM | POA: Diagnosis not present

## 2020-11-02 DIAGNOSIS — Z923 Personal history of irradiation: Secondary | ICD-10-CM | POA: Insufficient documentation

## 2020-11-02 MED ORDER — ONDANSETRON HCL 8 MG PO TABS: 8.0000 mg | ORAL_TABLET | Freq: Two times a day (BID) | ORAL | 1 refills | Status: DC | PRN

## 2020-11-02 MED ORDER — HEPARIN SOD (PORK) LOCK FLUSH 100 UNIT/ML IV SOLN
500.0000 [IU] | Freq: Once | INTRAVENOUS | Status: AC
  Administered 2020-11-02: 500 [IU] via INTRAVENOUS
  Filled 2020-11-02: qty 5

## 2020-11-02 MED ORDER — LIDOCAINE-PRILOCAINE 2.5-2.5 % EX CREA: TOPICAL_CREAM | CUTANEOUS | 3 refills | Status: DC

## 2020-11-02 MED ORDER — SODIUM CHLORIDE 0.9% FLUSH
10.0000 mL | Freq: Once | INTRAVENOUS | Status: AC
  Administered 2020-11-02: 10 mL via INTRAVENOUS
  Filled 2020-11-02: qty 10

## 2020-11-02 MED ORDER — HEPARIN SOD (PORK) LOCK FLUSH 100 UNIT/ML IV SOLN
INTRAVENOUS | Status: AC
  Filled 2020-11-02: qty 5

## 2020-11-02 MED ORDER — PROCHLORPERAZINE MALEATE 10 MG PO TABS: 10.0000 mg | ORAL_TABLET | Freq: Four times a day (QID) | ORAL | 1 refills | Status: DC | PRN

## 2020-11-02 NOTE — Progress Notes (Signed)
START OFF PATHWAY REGIMEN - Colorectal   [id number] (Leucovorin IV D1 + Fluorouracil IV D1/CIV D1,2 + Oxaliplatin IV D1) q14 Days:   A cycle is every 14 days:     Oxaliplatin      Leucovorin      Fluorouracil      Fluorouracil   **Always confirm dose/schedule in your pharmacy ordering system**  Patient Characteristics: Preoperative or Nonsurgical Candidate (Clinical Staging), Distant Metastasis Tumor Location: Rectal Therapeutic Status: Preoperative or Nonsurgical Candidate (Clinical Staging) AJCC T Category: cT4a AJCC N Category: cN2a AJCC M Category: [place] AJCC 8 Stage Grouping: IVA Intent of Therapy: Non-Curative / Palliative Intent, Discussed with Patient

## 2020-11-02 NOTE — Progress Notes (Signed)
Patient here today for evaluation regarding rectal cancer, seen by Dr. Rushie Chestnut as well.

## 2020-11-02 NOTE — Progress Notes (Addendum)
ALERT: A disease instance has been permanently removed from this patient's pathway record and replaced with a new disease instance. Information on the new disease instance will be transmitted in a separate message.  Disease Being Removed: Lymphoma and CLL  Reason for Removal: Previous diagnosis has morphed into a different diagnosis 

## 2020-11-02 NOTE — Progress Notes (Signed)
NEW PATIENT EVALUATION  Name: Veronica Boyd  MRN: [id number]  Date:   11/02/2020     DOB: [birth year]   This 74 y.o. 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  REFERRING PHYSICIAN: Barbette Reichmann, MD  CHIEF COMPLAINT:  Chief Complaint  Patient presents with  . rectal mass    DIAGNOSIS: The encounter diagnosis was Rectal mass.   PREVIOUS INVESTIGATIONS:  Clinical notes reviewed Pathology reviewed MRI from [facility] reviewed  HPI: Patient is a 74 year old female previously treated in our department for follicular lymphoma receiving involved field treatment to her axilla. She presented in November on colonoscopy to have a a frond-like villous nonobstructing large mass in her rectum partially circumferential path on review of pathology at [facility] there was focal invasive adenocarcinoma arising in the background of a tubular adenoma. MRI scan confirmed a rectal mass 2 cm from the anal verge semicircumferential 5 cm extent craniocaudal. There was focal extension of tumor beyond the lateral wall of the rectum extension of the tumor beyond the wall into the r rectovaginal recess with suspected invasion of the vaginal wall posteriorly. There was involvement also over the internal anal sphincter. There are also prominent presacral and mesorectal lymph nodes concerning for metastatic involvement. CT scan of chest abdomen pelvis showed multiple pulmonary nodules in the right lower lobe suspicious for metastatic disease. Rectal mass was obscured by previous left hip arthroplasty recommendation from [facility] was T [place] followed by concurrent chemoradiation followed by surgery. She is seen today for radiation oncology evaluation. She continues to have significant diarrhea. She is having no bone pain or other areas of pain or pulmonary  symptoms.  PLANNED TREATMENT REGIMEN: TNT followed by concurrent chemoradiation prior to surgical resection.  PAST MEDICAL HISTORY:  has a past medical history of Arthritis, Cataract, Chicken pox, Colon polyp, Follicular lymphoma ([facility]) (08/2016), Hyperlipidemia, and Osteoporosis.    PAST SURGICAL HISTORY:  Past Surgical History:  Procedure Laterality Date  . AXILLARY LYMPH NODE DISSECTION Right 08/21/2016   Procedure: AXILLARY LYMPH NODE excision;  Surgeon: Nadeen Landau, MD;  Location: [facility] ORS;  Service: General;  Laterality: Right;  . CATARACT EXTRACTION, BILATERAL Bilateral   . COLONOSCOPY N/A 09/19/2020   Procedure: COLONOSCOPY;  Surgeon: Regis Bill, MD;  Location: [facility] ENDOSCOPY;  Service: Endoscopy;  Laterality: N/A;  . EYE SURGERY    . JOINT REPLACEMENT Left   . PORTA CATH INSERTION N/A 09/16/2017   Procedure: PORTA CATH INSERTION;  Surgeon: Annice Needy, MD;  Location: [facility] INVASIVE CV LAB;  Service: Cardiovascular;  Laterality: N/A;  . TONSILLECTOMY    . TOTAL HIP ARTHROPLASTY Left 1992    FAMILY HISTORY: family history includes Basal cell carcinoma in her daughter; Breast cancer in her paternal aunt; Diabetes in her brother and sister; Lung cancer in her brother.  SOCIAL HISTORY:  reports that she has never smoked. She has never used smokeless tobacco. She reports that she does not drink alcohol and does not use drugs.  ALLERGIES: Patient has no known allergies.  MEDICATIONS:  Current Outpatient Medications  Medication Sig Dispense Refill  . alendronate (FOSAMAX) 70 MG tablet TAKE 1 TABLET EVERY 7 DAYS TAKE WITH A FULL GLASS OF WATER. DO NOT LIE DOWN FOR THE NEXT 30 MIN.  3  . diclofenac sodium (VOLTAREN) 1 % GEL APPLY 2 GRAMS TOPICALLY 2 (TWO) TIMES DAILY    .  lidocaine-prilocaine (EMLA) cream APPLY 1 APPLICATION TOPICALLY AS NEEDED. 30 g 1  . Multiple Vitamins-Minerals (CENTRUM SILVER PO) Take 1 tablet by mouth daily.    . pantoprazole (PROTONIX) 20 MG  tablet Take 20 mg by mouth at bedtime.     . simvastatin (ZOCOR) 20 MG tablet TAKE 1 TABLET BY MOUTH EVERY DAY AT NIGHT    . tizanidine (ZANAFLEX) 2 MG capsule Take 2 mg by mouth 3 (three) times daily.    . traMADol-acetaminophen (ULTRACET) 37.5-325 MG tablet Take 1 tablet by mouth every 8 (eight) hours as needed.      No current facility-administered medications for this encounter.   Facility-Administered Medications Ordered in Other Encounters  Medication Dose Route Frequency Provider Last Rate Last Admin  . heparin lock flush 100 unit/mL  500 Units Intravenous Once Lloyd Huger, MD      . heparin lock flush 100 unit/mL  500 Units Intracatheter PRN Lloyd Huger, MD      . sodium chloride flush (NS) 0.9 % injection 10 mL  10 mL Intravenous PRN Lloyd Huger, MD   10 mL at 12/04/17 0901  . sodium chloride flush (NS) 0.9 % injection 10 mL  10 mL Intracatheter PRN Lloyd Huger, MD      . yttrium-90 injection [id number] millicurie  99991111 millicurie Intravenous Once [name], MD        ECOG PERFORMANCE STATUS:  1 - Symptomatic but completely ambulatory  REVIEW OF SYSTEMS: Patient does continue have diarrhea. No specific complaints from her follicular lymphoma. Well-developed well-nourished patient in NAD. HEENT reveals PERLA, EOMI, discs not visualized.  Oral cavity is clear. No oral mucosal lesions are identified. Neck is clear without evidence of cervical or supraclavicular adenopathy. Lungs are clear to A&P. Cardiac examination is essentially unremarkable with regular rate and rhythm without murmur rub or thrill. Abdomen is benign with no organomegaly or masses noted. Motor sensory and DTR levels are equal and symmetric in the upper and lower extremities. Cranial nerves II through XII are grossly intact. Proprioception is intact. No peripheral adenopathy or edema is identified. No motor or sensory levels are noted. Crude visual fields are within normal range.   PHYSICAL  EXAM: BP (!) 166/80 (BP Location: Left Arm, Patient Position: Sitting)   Pulse 83   Temp (!) 96.5 F (35.8 C) (Tympanic)   Resp 18   Wt 115 lb 1.6 oz (52.2 kg)   BMI 24.06 kg/m  Slightly frail-appearing female in NAD. Well-developed well-nourished patient in NAD. HEENT reveals PERLA, EOMI, discs not visualized.  Oral cavity is clear. No oral mucosal lesions are identified. Neck is clear without evidence of cervical or supraclavicular adenopathy. Lungs are clear to A&P. Cardiac examination is essentially unremarkable with regular rate and rhythm without murmur rub or thrill. Abdomen is benign with no organomegaly or masses noted. Motor sensory and DTR levels are equal and symmetric in the upper and lower extremities. Cranial nerves II through XII are grossly intact. Proprioception is intact. No peripheral adenopathy or edema is identified. No motor or sensory levels are noted. Crude visual fields are within normal range.  LABORATORY DATA: Pathology report reviewed    RADIOLOGY RESULTS: MRI scans and CT scans requested for review, PET CT scan has been ordered   IMPRESSION: Locally advanced rectal cancer in 74 year old female with known follicular lymphoma for TNT followed by concurrent chemoradiation in a neoadjuvant fashion prior to surgical resection  PLAN: At this time I have discussed case personally  with medical oncology they will start with TNT. I will reevaluate the patient after completion of FOLFOX chemotherapy for consideration of 5-FU and radiation in a concurrent fashion in a neoadjuvant fashion. Would plan on delivering 4500 cGy to her whole pelvis followed by 540 cGy boost to the primary mass. We use her initial MRI scan for a fusion study. Risks and benefits of treatment including increased diarrhea fatigue alteration of blood counts possible increased lower urinary tract symptoms skin reaction all were discussed with the patient and her daughter. I will reevaluate the patient at  completion of her TNT.  I would like to take this opportunity to thank you for allowing me to participate in the care of your patient.[name], MD

## 2020-11-10 ENCOUNTER — Inpatient Hospital Stay: Payer: Medicare Other | Attending: Nurse Practitioner

## 2020-11-10 ENCOUNTER — Inpatient Hospital Stay (HOSPITAL_BASED_OUTPATIENT_CLINIC_OR_DEPARTMENT_OTHER): Payer: Medicare Other | Admitting: Nurse Practitioner

## 2020-11-10 ENCOUNTER — Other Ambulatory Visit: Payer: Self-pay

## 2020-11-10 DIAGNOSIS — E876 Hypokalemia: Secondary | ICD-10-CM | POA: Insufficient documentation

## 2020-11-10 DIAGNOSIS — C20 Malignant neoplasm of rectum: Secondary | ICD-10-CM

## 2020-11-10 DIAGNOSIS — Z5111 Encounter for antineoplastic chemotherapy: Secondary | ICD-10-CM | POA: Insufficient documentation

## 2020-11-10 NOTE — Progress Notes (Signed)
Virtual Visit Progress Note  [place] [facility] [place]  Telephone:([phone] [phone]  Patient Care Team: Tracie Harrier, MD as PCP - General (Internal Medicine) Lloyd Huger, MD as Consulting Physician (Oncology) Leonie Green, MD as Referring Physician (Surgery) [name], MD as Referring Physician (Radiation Oncology) Jacquelin Hawking, NP as Nurse Practitioner (Oncology) Clent Jacks, RN as Oncology Nurse Navigator   Name of the patient: Veronica Boyd  [id number]  1946-05-23   Date of visit: 11/10/20  I connected with Veronica Boyd on 11/10/20 at  1:00 PM EST by telephone visit and verified that I am speaking with the correct person using two identifiers.   I discussed the limitations, risks, security and privacy concerns of performing an evaluation and management service by telemedicine and the availability of in-person appointments. I also discussed with the patient that there may be a patient responsible charge related to this service. The patient expressed understanding and agreed to proceed.   Other persons participating in the visit and their role in the encounter: Magdalene Patricia, Therapist, sports (Nurse Navigator & Chemo Education)  Patient's location: home Provider's location: clinic  Diagnosis- follicular lymphoma  Chief complaint/Reason for visit- Initial Meeting for [facility], preparing for starting chemotherapy  Heme/Onc history:  Oncology History Overview Note  Initial diagnosis 2014. S/p radiation 3 years ago.  Noted increase lymphadenopathy and biopsy was recommended.  Unfortunately she refused and was lost to follow-up. Return to clinic in September 2017 with increased swelling surrounding her right collarbone.  Had biopsy that was suggestive of follicular lymphoma but not definitive.  Had PET scan to complete the staging work-up.  PET scan showed hypermetabolic adenopathy in bilateral  subpectoral and axillary regions, right internal mammary chain and right paratracheal and subclavicular regions.  There is no evidence of hypermetabolic lymphadenopathy within the abdomen or pelvis.  Received Zevalin on July 30, 2017 with not much therapeutic effect.  She subsequently underwent cycles 5 of R CHOP chemotherapy with Neulasta support completing on December 12, 2017.  No further intervention or treatments are needed at this time.  PET scan results from February 19, 2018 reviewed independently and reported as above with no suspicious finding for active lymphoma.      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 ([place])  07/24/2016 Initial Diagnosis   Follicular lymphoma ([place])   Rectal cancer ([place])  11/02/2020 Initial Diagnosis   Rectal cancer ([place])   11/02/2020 Cancer Staging   Staging form: Colon and Rectum, AJCC 8th Edition - Clinical: Stage IVA Veronica Boyd, Veronica Boyd, Veronica Boyd) - Signed by Lloyd Huger, MD on 11/02/2020   11/16/2020 -  Chemotherapy   The patient had palonosetron (ALOXI) injection 0.25 mg, 0.25 mg, Intravenous,  Once, 0 of 6 cycles leucovorin 584 mg in dextrose 5 % 250 mL infusion, 400 mg/m2 = 584 mg, Intravenous,  Once, 0 of 6 cycles oxaliplatin (ELOXATIN) 125 mg in dextrose 5 % 500 mL chemo infusion, 85 mg/m2 = 125 mg, Intravenous,  Once, 0 of 6 cycles fluorouracil (ADRUCIL) chemo injection 600 mg, 400 mg/m2 = 600 mg, Intravenous,  Once, 0 of 6 cycles fluorouracil (ADRUCIL) 3,500 mg in sodium chloride 0.9 % 80 mL chemo infusion, 2,400 mg/m2 = 3,500 mg, Intravenous, 1 Day/Dose, 0 of 6 cycles  for chemotherapy treatment.      Interval history-  Veronica Boyd, 75 year old female, with above history of follicular lymphoma, newly diagnosed with rectal  cancer, who presents to chemo care clinic today for initial meeting in preparation for starting chemotherapy. I introduced the chemo care clinic and we discussed that the role of the clinic is to  assist those who are at an increased risk of emergency room visits and/or complications during the course of chemotherapy treatment. We discussed that the increased risk takes into account factors such as age, performance status, and co-morbidities. We also discussed that for some, this might include barriers to care such as not having a primary care provider, lack of insurance/transportation, or not being able to afford medications. We discussed that the goal of the program is to help prevent unplanned ER visits and help reduce complications during chemotherapy. We do this by discussing specific risk factors to each individual and identifying ways that we can help improve these risk factors and reduce barriers to care.   ECOG FS:1 - Symptomatic but completely ambulatory  Review of systems- Review of Systems  Constitutional: Negative for chills, fever, malaise/fatigue and weight loss.  HENT: Negative for hearing loss, nosebleeds, sore throat and tinnitus.   Eyes: Negative for blurred vision and double vision.  Respiratory: Negative for cough, hemoptysis, shortness of breath and wheezing.   Cardiovascular: Negative for chest pain, palpitations and leg swelling.  Gastrointestinal: Negative for abdominal pain, blood in stool, constipation, diarrhea, melena, nausea and vomiting.  Genitourinary: Negative for dysuria and urgency.  Musculoskeletal: Negative for back pain, falls, joint pain and myalgias.  Skin: Negative for itching and rash.  Neurological: Negative for dizziness, tingling, sensory change, loss of consciousness, weakness and headaches.  Endo/Heme/Allergies: Negative for environmental allergies. Does not bruise/bleed easily.  Psychiatric/Behavioral: Negative for depression. The patient is not nervous/anxious and does not have insomnia.     Anticipated treatment: FOLFOX chemotherapy  No Known Allergies  Past Medical History:  Diagnosis Date  . Arthritis   . Cataract    bilateral  .  Chicken pox   . Colon polyp   . Follicular lymphoma ([facility]) 08/2016   lymph nodes   . Hyperlipidemia   . Osteoporosis     Past Surgical History:  Procedure Laterality Date  . AXILLARY LYMPH NODE DISSECTION Right 08/21/2016   Procedure: AXILLARY LYMPH NODE excision;  Surgeon: Nadeen Landau, MD;  Location: [facility] ORS;  Service: General;  Laterality: Right;  . CATARACT EXTRACTION, BILATERAL Bilateral   . COLONOSCOPY N/A 09/19/2020   Procedure: COLONOSCOPY;  Surgeon: Regis Bill, MD;  Location: [facility] ENDOSCOPY;  Service: Endoscopy;  Laterality: N/A;  . EYE SURGERY    . JOINT REPLACEMENT Left   . PORTA CATH INSERTION N/A 09/16/2017   Procedure: PORTA CATH INSERTION;  Surgeon: Annice Needy, MD;  Location: [facility] INVASIVE CV LAB;  Service: Cardiovascular;  Laterality: N/A;  . TONSILLECTOMY    . TOTAL HIP ARTHROPLASTY Left 1992    Social History   Socioeconomic History  . Marital status: Divorced    Spouse name: Not on file  . Number of children: Not on file  . Years of education: Not on file  . Highest education level: Not on file  Occupational History  . Not on file  Tobacco Use  . Smoking status: Never Smoker  . Smokeless tobacco: Never Used  Vaping Use  . Vaping Use: Never used  Substance and Sexual Activity  . Alcohol use: No  . Drug use: No  . Sexual activity: Not on file  Other Topics Concern  . Not on file  Social History Narrative  .  Not on file   Social Determinants of Health   Financial Resource Strain: Not on file  Food Insecurity: Not on file  Transportation Needs: Not on file  Physical Activity: Not on file  Stress: Not on file  Social Connections: Not on file  Intimate Partner Violence: Not on file   Immunization History  Administered Date(s) Administered  . Influenza-Unspecified 09/20/2014  . PFIZER(Purple Top)SARS-COV-2 Vaccination 01/07/2020, 02/02/2020  . Zoster 09/20/2014     Family History  Problem Relation Age of Onset  .  Diabetes Sister   . Lung cancer Brother   . Diabetes Brother   . Basal cell carcinoma Daughter   . Breast cancer Paternal Aunt      Current Outpatient Medications:  .  alendronate (FOSAMAX) 70 MG tablet, TAKE 1 TABLET EVERY 7 DAYS TAKE WITH A FULL GLASS OF WATER. DO NOT LIE DOWN FOR THE NEXT 30 MIN., Disp: , Rfl: 3 .  diclofenac sodium (VOLTAREN) 1 % GEL, APPLY 2 GRAMS TOPICALLY 2 (TWO) TIMES DAILY, Disp: , Rfl:  .  lidocaine-prilocaine (EMLA) cream, APPLY 1 APPLICATION TOPICALLY AS NEEDED., Disp: 30 g, Rfl: 1 .  lidocaine-prilocaine (EMLA) cream, Apply to affected area once, Disp: 30 g, Rfl: 3 .  Multiple Vitamins-Minerals (CENTRUM SILVER PO), Take 1 tablet by mouth daily., Disp: , Rfl:  .  ondansetron (ZOFRAN) 8 MG tablet, Take 1 tablet (8 mg total) by mouth 2 (two) times daily as needed for refractory nausea / vomiting. Start on day 3 after chemotherapy., Disp: 60 tablet, Rfl: 1 .  pantoprazole (PROTONIX) 20 MG tablet, Take 20 mg by mouth at bedtime. , Disp: , Rfl:  .  prochlorperazine (COMPAZINE) 10 MG tablet, Take 1 tablet (10 mg total) by mouth every 6 (six) hours as needed (Nausea or vomiting)., Disp: 60 tablet, Rfl: 1 .  simvastatin (ZOCOR) 20 MG tablet, TAKE 1 TABLET BY MOUTH EVERY DAY AT NIGHT, Disp: , Rfl:  .  tizanidine (ZANAFLEX) 2 MG capsule, Take 2 mg by mouth 3 (three) times daily., Disp: , Rfl:  .  traMADol-acetaminophen (ULTRACET) 37.5-325 MG tablet, Take 1 tablet by mouth every 8 (eight) hours as needed. , Disp: , Rfl:  No current facility-administered medications for this visit.  Facility-Administered Medications Ordered in Other Visits:  .  heparin lock flush 100 unit/mL, 500 Units, Intravenous, Once, Finnegan, Kathlene November, MD .  heparin lock flush 100 unit/mL, 500 Units, Intracatheter, PRN, Grayland Ormond, Kathlene November, MD .  sodium chloride flush (NS) 0.9 % injection 10 mL, 10 mL, Intravenous, PRN, Lloyd Huger, MD, 10 mL at 12/04/17 0901 .  sodium chloride flush (NS) 0.9  % injection 10 mL, 10 mL, Intracatheter, PRN, Grayland Ormond, Kathlene November, MD .  yttrium-90 injection [id number] millicurie, 99991111 millicurie, Intravenous, Once, Chrystal, Glenn, MD  Physical exam: There were no vitals filed for this visit. Physical Exam Pulmonary:     Effort: No respiratory distress.  Neurological:     Mental Status: She is alert and oriented to person, place, and time.  Psychiatric:        Mood and Affect: Mood normal.        Behavior: Behavior normal.      CMP Latest Ref Rng & Units 06/28/2020  Glucose 70 - 99 mg/dL 101(H)  BUN 8 - 23 mg/dL 15  Creatinine 0.44 - 1.00 mg/dL 0.68  Sodium 135 - 145 mmol/L 141  Potassium 3.5 - 5.1 mmol/L 3.9  Chloride 98 - 111 mmol/L 106  CO2 22 -  32 mmol/L 24  Calcium 8.9 - 10.3 mg/dL 8.8(L)  Total Protein 6.5 - 8.1 g/dL 7.1  Total Bilirubin 0.3 - 1.2 mg/dL 0.6  Alkaline Phos 38 - 126 U/L 67  AST 15 - 41 U/L 22  ALT 0 - 44 U/L 14   CBC Latest Ref Rng & Units 06/28/2020  WBC 4.0 - 10.5 K/uL 6.5  Hemoglobin 12.0 - 15.0 g/dL 13.3  Hematocrit 36.0 - 46.0 % 39.7  Platelets 150 - 400 K/uL 174    No images are attached to the encounter.  No results found.   Assessment and plan- Patient is a 75 y.o. female who presents to [facility] for initial meeting in preparation for starting chemotherapy for the treatment of:     1. Stage IV rectal cancer-biopsy confirmed.  MRI at [facility] on 10/11/2020 revealed extension of the tumor beyond the wall into the rectovaginal recess with suspected invasion of the vaginal wall posteriorly and involving internal anal sphincter.  Numerous prominent presacral and mesorectal lymph nodes highly suspicious for malignancy.  Multiple pulmonary nodules.  Plan to start neoadjuvant FOLFOX chemotherapy after which she will be reevaluated for consideration of surgery and radiation. Risks vs benefits of chemotherapy reviewed again today. Patient wishes to proceed with treatment.   2. Chemo Care Clinic/High Risk for  ER/Hospitalization during chemotherapy- We discussed the role of the chemo care clinic and identified patient specific risk factors. I discussed that patient was identified as high risk primarily based on: age, medicaid and medicare, recent hospitalization, and history of anemia.   3. Social Determinants of Health- we discussed that social determinants of health may have significant impacts on health and outcomes for cancer patients.  Today we discussed specific social determinants of performance status, alcohol use, depression, financial needs, food insecurity, housing, interpersonal violence, social connections, stress, tobacco use, and transportation.  After lengthy discussion, patient declines specific needs today. She says she has gone through chemotherapy before and feels ready to start treatment.   4. Palliative Care- based on stage of cancer and/or identified needs today, I discussed referral to palliative care for goals of care and advanced care planning.. She declines today but will reconsider in the future.   We also discussed the role of the Symptom Management Clinic at [facility] for acute issues and methods of contacting clinic/provider. She denies needing specific assistance at this time.   Return to clinic: as scheduled.   Visit Diagnosis 1. Rectal cancer ([place])      I discussed the assessment and treatment plan with the patient. The patient was provided an opportunity to ask questions and all were answered. The patient agreed with the plan and demonstrated an understanding of the instructions.   The patient was advised to call back or seek an in-person evaluation if the symptoms worsen or if the condition fails to improve as anticipated.   I provided 20 minutes of non face-to-face telephone visit time during this encounter, and > 50% was spent counseling as documented under my assessment & plan.  Beckey Rutter, DNP, AGNP-C [place] at [facility] [phone]  (clinic)

## 2020-11-10 NOTE — Patient Instructions (Addendum)
Oxaliplatin Injection What is this medicine? OXALIPLATIN (ox AL i PLA tin) is a chemotherapy drug. It targets fast dividing cells, like cancer cells, and causes these cells to die. This medicine is used to treat cancers of the colon and rectum, and many other cancers. This medicine may be used for other purposes; ask your health care provider or pharmacist if you have questions. COMMON BRAND NAME(S): Eloxatin What should I tell my health care provider before I take this medicine? They need to know if you have any of these conditions:  heart disease  history of irregular heartbeat  liver disease  low blood counts, like white cells, platelets, or red blood cells  lung or breathing disease, like asthma  take medicines that treat or prevent blood clots  tingling of the fingers or toes, or other nerve disorder  an unusual or allergic reaction to oxaliplatin, other chemotherapy, other medicines, foods, dyes, or preservatives  pregnant or trying to get pregnant  breast-feeding How should I use this medicine? This drug is given as an infusion into a vein. It is administered in a hospital or clinic by a specially trained health care professional. Talk to your pediatrician regarding the use of this medicine in children. Special care may be needed. Overdosage: If you think you have taken too much of this medicine contact a poison control center or emergency room at once. NOTE: This medicine is only for you. Do not share this medicine with others. What if I miss a dose? It is important not to miss a dose. Call your doctor or health care professional if you are unable to keep an appointment. What may interact with this medicine? Do not take this medicine with any of the following medications:  cisapride  dronedarone  pimozide  thioridazine This medicine may also interact with the following medications:  aspirin and aspirin-like medicines  certain medicines that treat or prevent  blood clots like warfarin, apixaban, dabigatran, and rivaroxaban  cisplatin  cyclosporine  diuretics  medicines for infection like acyclovir, adefovir, amphotericin B, bacitracin, cidofovir, foscarnet, ganciclovir, gentamicin, pentamidine, vancomycin  NSAIDs, medicines for pain and inflammation, like ibuprofen or naproxen  other medicines that prolong the QT interval (an abnormal heart rhythm)  pamidronate  zoledronic acid This list may not describe all possible interactions. Give your health care provider a list of all the medicines, herbs, non-prescription drugs, or dietary supplements you use. Also tell them if you smoke, drink alcohol, or use illegal drugs. Some items may interact with your medicine. What should I watch for while using this medicine? Your condition will be monitored carefully while you are receiving this medicine. You may need blood work done while you are taking this medicine. This medicine may make you feel generally unwell. This is not uncommon as chemotherapy can affect healthy cells as well as cancer cells. Report any side effects. Continue your course of treatment even though you feel ill unless your healthcare professional tells you to stop. This medicine can make you more sensitive to cold. Do not drink cold drinks or use ice. Cover exposed skin before coming in contact with cold temperatures or cold objects. When out in cold weather wear warm clothing and cover your mouth and nose to warm the air that goes into your lungs. Tell your doctor if you get sensitive to the cold. Do not become pregnant while taking this medicine or for 9 months after stopping it. Women should inform their health care professional if they wish to become   pregnant or think they might be pregnant. Men should not father a child while taking this medicine and for 6 months after stopping it. There is potential for serious side effects to an unborn child. Talk to your health care professional  for more information. Do not breast-feed a child while taking this medicine or for 3 months after stopping it. This medicine has caused ovarian failure in some women. This medicine may make it more difficult to get pregnant. Talk to your health care professional if you are concerned about your fertility. This medicine has caused decreased sperm counts in some men. This may make it more difficult to father a child. Talk to your health care professional if you are concerned about your fertility. This medicine may increase your risk of getting an infection. Call your health care professional for advice if you get a fever, chills, or sore throat, or other symptoms of a cold or flu. Do not treat yourself. Try to avoid being around people who are sick. Avoid taking medicines that contain aspirin, acetaminophen, ibuprofen, naproxen, or ketoprofen unless instructed by your health care professional. These medicines may hide a fever. Be careful brushing or flossing your teeth or using a toothpick because you may get an infection or bleed more easily. If you have any dental work done, tell your dentist you are receiving this medicine. What side effects may I notice from receiving this medicine? Side effects that you should report to your doctor or health care professional as soon as possible:  allergic reactions like skin rash, itching or hives, swelling of the face, lips, or tongue  breathing problems  cough  low blood counts - this medicine may decrease the number of white blood cells, red blood cells, and platelets. You may be at increased risk for infections and bleeding  nausea, vomiting  pain, redness, or irritation at site where injected  pain, tingling, numbness in the hands or feet  signs and symptoms of bleeding such as bloody or black, tarry stools; red or dark brown urine; spitting up blood or brown material that looks like coffee grounds; red spots on the skin; unusual bruising or bleeding  from the eyes, gums, or nose  signs and symptoms of a dangerous change in heartbeat or heart rhythm like chest pain; dizziness; fast, irregular heartbeat; palpitations; feeling faint or lightheaded; falls  signs and symptoms of infection like fever; chills; cough; sore throat; pain or trouble passing urine  signs and symptoms of liver injury like dark yellow or brown urine; general ill feeling or flu-like symptoms; light-colored stools; loss of appetite; nausea; right upper belly pain; unusually weak or tired; yellowing of the eyes or skin  signs and symptoms of low red blood cells or anemia such as unusually weak or tired; feeling faint or lightheaded; falls  signs and symptoms of muscle injury like dark urine; trouble passing urine or change in the amount of urine; unusually weak or tired; muscle pain; back pain Side effects that usually do not require medical attention (report to your doctor or health care professional if they continue or are bothersome):  changes in taste  diarrhea  gas  hair loss  loss of appetite  mouth sores This list may not describe all possible side effects. Call your doctor for medical advice about side effects. You may report side effects to FDA at 1-800-FDA-1088. Where should I keep my medicine? This drug is given in a hospital or clinic and will not be stored at home. NOTE:   This sheet is a summary. It may not cover all possible information. If you have questions about this medicine, talk to your doctor, pharmacist, or health care provider.  2020 [employer] Standard (2019-03-11 12:20:35) Fluorouracil, 5-FU injection What is this medicine? FLUOROURACIL, 5-FU (flure oh YOOR a sil) is a chemotherapy drug. It slows the growth of cancer cells. This medicine is used to treat many types of cancer like breast cancer, colon or rectal cancer, pancreatic cancer, and stomach cancer. This medicine may be used for other purposes; ask your health care provider or  pharmacist if you have questions. COMMON BRAND NAME(S): Adrucil What should I tell my health care provider before I take this medicine? They need to know if you have any of these conditions:  blood disorders  dihydropyrimidine dehydrogenase (DPD) deficiency  infection (especially a virus infection such as chickenpox, cold sores, or herpes)  kidney disease  liver disease  malnourished, poor nutrition  recent or ongoing radiation therapy  an unusual or allergic reaction to fluorouracil, other chemotherapy, other medicines, foods, dyes, or preservatives  pregnant or trying to get pregnant  breast-feeding How should I use this medicine? This drug is given as an infusion or injection into a vein. It is administered in a hospital or clinic by a specially trained health care professional. Talk to your pediatrician regarding the use of this medicine in children. Special care may be needed. Overdosage: If you think you have taken too much of this medicine contact a poison control center or emergency room at once. NOTE: This medicine is only for you. Do not share this medicine with others. What if I miss a dose? It is important not to miss your dose. Call your doctor or health care professional if you are unable to keep an appointment. What may interact with this medicine?  allopurinol  cimetidine  dapsone  digoxin  hydroxyurea  leucovorin  levamisole  medicines for seizures like ethotoin, fosphenytoin, phenytoin  medicines to increase blood counts like filgrastim, pegfilgrastim, sargramostim  medicines that treat or prevent blood clots like warfarin, enoxaparin, and dalteparin  methotrexate  metronidazole  pyrimethamine  some other chemotherapy drugs like busulfan, cisplatin, estramustine, vinblastine  trimethoprim  trimetrexate  vaccines Talk to your doctor or health care professional before taking any of these  medicines:  acetaminophen  aspirin  ibuprofen  ketoprofen  naproxen This list may not describe all possible interactions. Give your health care provider a list of all the medicines, herbs, non-prescription drugs, or dietary supplements you use. Also tell them if you smoke, drink alcohol, or use illegal drugs. Some items may interact with your medicine. What should I watch for while using this medicine? Visit your doctor for checks on your progress. This drug may make you feel generally unwell. This is not uncommon, as chemotherapy can affect healthy cells as well as cancer cells. Report any side effects. Continue your course of treatment even though you feel ill unless your doctor tells you to stop. In some cases, you may be given additional medicines to help with side effects. Follow all directions for their use. Call your doctor or health care professional for advice if you get a fever, chills or sore throat, or other symptoms of a cold or flu. Do not treat yourself. This drug decreases your body's ability to fight infections. Try to avoid being around people who are sick. This medicine may increase your risk to bruise or bleed. Call your doctor or health care professional if you notice any   unusual bleeding. Be careful brushing and flossing your teeth or using a toothpick because you may get an infection or bleed more easily. If you have any dental work done, tell your dentist you are receiving this medicine. Avoid taking products that contain aspirin, acetaminophen, ibuprofen, naproxen, or ketoprofen unless instructed by your doctor. These medicines may hide a fever. Do not become pregnant while taking this medicine. Women should inform their doctor if they wish to become pregnant or think they might be pregnant. There is a potential for serious side effects to an unborn child. Talk to your health care professional or pharmacist for more information. Do not breast-feed an infant while taking  this medicine. Men should inform their doctor if they wish to father a child. This medicine may lower sperm counts. Do not treat diarrhea with over the counter products. Contact your doctor if you have diarrhea that lasts more than 2 days or if it is severe and watery. This medicine can make you more sensitive to the sun. Keep out of the sun. If you cannot avoid being in the sun, wear protective clothing and use sunscreen. Do not use sun lamps or tanning beds/booths. What side effects may I notice from receiving this medicine? Side effects that you should report to your doctor or health care professional as soon as possible:  allergic reactions like skin rash, itching or hives, swelling of the face, lips, or tongue  low blood counts - this medicine may decrease the number of white blood cells, red blood cells and platelets. You may be at increased risk for infections and bleeding.  signs of infection - fever or chills, cough, sore throat, pain or difficulty passing urine  signs of decreased platelets or bleeding - bruising, pinpoint red spots on the skin, black, tarry stools, blood in the urine  signs of decreased red blood cells - unusually weak or tired, fainting spells, lightheadedness  breathing problems  changes in vision  chest pain  mouth sores  nausea and vomiting  pain, swelling, redness at site where injected  pain, tingling, numbness in the hands or feet  redness, swelling, or sores on hands or feet  stomach pain  unusual bleeding Side effects that usually do not require medical attention (report to your doctor or health care professional if they continue or are bothersome):  changes in finger or toe nails  diarrhea  dry or itchy skin  hair loss  headache  loss of appetite  sensitivity of eyes to the light  stomach upset  unusually teary eyes This list may not describe all possible side effects. Call your doctor for medical advice about side effects.  You may report side effects to FDA at 1-800-FDA-1088. Where should I keep my medicine? This drug is given in a hospital or clinic and will not be stored at home. NOTE: This sheet is a summary. It may not cover all possible information. If you have questions about this medicine, talk to your doctor, pharmacist, or health care provider.  2020 [employer] Standard (2008-02-25 13:53:16) Leucovorin injection What is this medicine? LEUCOVORIN (loo koe VOR in) is used to prevent or treat the harmful effects of some medicines. This medicine is used to treat anemia caused by a low amount of folic acid in the body. It is also used with 5-fluorouracil (5-FU) to treat colon cancer. This medicine may be used for other purposes; ask your health care provider or pharmacist if you have questions. What should I tell my health care   provider before I take this medicine? They need to know if you have any of these conditions:  anemia from low levels of vitamin B-12 in the blood  an unusual or allergic reaction to leucovorin, folic acid, other medicines, foods, dyes, or preservatives  pregnant or trying to get pregnant  breast-feeding How should I use this medicine? This medicine is for injection into a muscle or into a vein. It is given by a health care professional in a hospital or clinic setting. Talk to your pediatrician regarding the use of this medicine in children. Special care may be needed. Overdosage: If you think you have taken too much of this medicine contact a poison control center or emergency room at once. NOTE: This medicine is only for you. Do not share this medicine with others. What if I miss a dose? This does not apply. What may interact with this medicine?  capecitabine  fluorouracil  phenobarbital  phenytoin  primidone  trimethoprim-sulfamethoxazole This list may not describe all possible interactions. Give your health care provider a list of all the medicines, herbs,  non-prescription drugs, or dietary supplements you use. Also tell them if you smoke, drink alcohol, or use illegal drugs. Some items may interact with your medicine. What should I watch for while using this medicine? Your condition will be monitored carefully while you are receiving this medicine. This medicine may increase the side effects of 5-fluorouracil, 5-FU. Tell your doctor or health care professional if you have diarrhea or mouth sores that do not get better or that get worse. What side effects may I notice from receiving this medicine? Side effects that you should report to your doctor or health care professional as soon as possible:  allergic reactions like skin rash, itching or hives, swelling of the face, lips, or tongue  breathing problems  fever, infection  mouth sores  unusual bleeding or bruising  unusually weak or tired Side effects that usually do not require medical attention (report to your doctor or health care professional if they continue or are bothersome):  constipation or diarrhea  loss of appetite  nausea, vomiting This list may not describe all possible side effects. Call your doctor for medical advice about side effects. You may report side effects to FDA at 1-800-FDA-1088. Where should I keep my medicine? This drug is given in a hospital or clinic and will not be stored at home. NOTE: This sheet is a summary. It may not cover all possible information. If you have questions about this medicine, talk to your doctor, pharmacist, or health care provider.  2020 [employer] Standard (2008-04-27 16:50:29)  

## 2020-11-12 NOTE — Progress Notes (Signed)
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  INTERVAL HISTORY: Patient returns to clinic today for further evaluation and initiation of cycle 1 of FOLFOX.  She has multiple pulmonary nodules that are highly suspicious, but not diagnostic.  PET scan is scheduled for tomorrow.  She otherwise feels well.  She has noticed no new lymphadenopathy.  She denies any pain.  She has no neurologic complaints. She denies any fevers, night sweats, or weight loss.  She denies any chest pain, shortness of breath, cough, or hemoptysis.  She denies any nausea, vomiting, or constipation.  Patient admitted to some hematuria, but none recently.  Patient offers no further specific complaints today.  REVIEW OF SYSTEMS:   Review of Systems  Constitutional: Negative.  Negative for fever, malaise/fatigue and weight loss.  Respiratory: Negative.  Negative for cough, hemoptysis and shortness of breath.   Cardiovascular: Negative.  Negative for chest pain and leg swelling.  Gastrointestinal: Negative.  Negative for abdominal pain, blood in stool, melena, nausea and vomiting.  Genitourinary: Negative.  Negative for dysuria.  Musculoskeletal: Negative.  Negative for back pain, joint pain and neck pain.  Skin: Negative.  Negative for rash.  Neurological: Negative.  Negative for sensory change, focal weakness, weakness and headaches.  Psychiatric/Behavioral: Negative.  The patient is not  nervous/anxious and does not have insomnia.    As per HPI. Otherwise, a complete review of systems is negative.   PAST MEDICAL HISTORY: Past Medical History:  Diagnosis Date  . Arthritis   . Cataract    bilateral  . Chicken pox   . Colon polyp   . Follicular lymphoma ([place]) 08/2016   lymph nodes   . Hyperlipidemia   . Osteoporosis     PAST SURGICAL HISTORY: Past Surgical History:  Procedure Laterality Date  . AXILLARY LYMPH NODE DISSECTION Right 08/21/2016   Procedure: AXILLARY LYMPH NODE excision;  Surgeon: Leonie Green, MD;  Location: [facility] ORS;  Service: General;  Laterality: Right;  . CATARACT EXTRACTION, BILATERAL Bilateral   . COLONOSCOPY N/A 09/19/2020   Procedure: COLONOSCOPY;  Surgeon: Lesly Rubenstein, MD;  Location: [facility] ENDOSCOPY;  Service: Endoscopy;  Laterality: N/A;  . EYE SURGERY    . JOINT REPLACEMENT Left   . PORTA CATH INSERTION N/A 09/16/2017   Procedure: PORTA CATH INSERTION;  Surgeon: Algernon Huxley, MD;  Location: [place] [facility];  Service: Cardiovascular;  Laterality: N/A;  . TONSILLECTOMY    . TOTAL HIP ARTHROPLASTY Left 1992    FAMILY HISTORY: Family History  Problem Relation Age of Onset  . Diabetes Sister   . Lung cancer Brother   . Diabetes Brother   . Basal cell carcinoma Daughter   . Breast cancer Paternal Aunt     ADVANCED DIRECTIVES (Y/N):  N  HEALTH MAINTENANCE: Social History   Tobacco Use  . Smoking status: Never Smoker  . Smokeless tobacco: Never Used  Vaping Use  . Vaping Use: Never used  Substance Use Topics  . Alcohol use: No  . Drug use: No  Colonoscopy:  PAP:  Bone density:  Lipid panel:  No Known Allergies  Current Outpatient Medications  Medication Sig Dispense Refill  . alendronate (FOSAMAX) 70 MG tablet TAKE 1 TABLET EVERY 7 DAYS TAKE WITH A FULL GLASS OF WATER. DO NOT LIE DOWN FOR THE NEXT 30 MIN.  3  . diclofenac sodium (VOLTAREN) 1 % GEL APPLY 2 GRAMS TOPICALLY 2 (TWO) TIMES DAILY     . lidocaine-prilocaine (EMLA) cream APPLY 1 APPLICATION TOPICALLY AS NEEDED. 30 g 1  . lidocaine-prilocaine (EMLA) cream Apply to affected area once 30 g 3  . Multiple Vitamins-Minerals (CENTRUM SILVER PO) Take 1 tablet by mouth daily.    . ondansetron (ZOFRAN) 8 MG tablet Take 1 tablet (8 mg total) by mouth 2 (two) times daily as needed for refractory nausea / vomiting. Start on day 3 after chemotherapy. 60 tablet 1  . pantoprazole (PROTONIX) 20 MG tablet Take 20 mg by mouth at bedtime.     . prochlorperazine (COMPAZINE) 10 MG tablet Take 1 tablet (10 mg total) by mouth every 6 (six) hours as needed (Nausea or vomiting). 60 tablet 1  . simvastatin (ZOCOR) 20 MG tablet TAKE 1 TABLET BY MOUTH EVERY DAY AT NIGHT    . tizanidine (ZANAFLEX) 2 MG capsule Take 2 mg by mouth 3 (three) times daily.    . traMADol-acetaminophen (ULTRACET) 37.5-325 MG tablet Take 1 tablet by mouth every 8 (eight) hours as needed.      No current facility-administered medications for this visit.   Facility-Administered Medications Ordered in Other Visits  Medication Dose Route Frequency Provider Last Rate Last Admin  . fluorouracil (ADRUCIL) 3,500 mg in sodium chloride 0.9 % 80 mL chemo infusion  2,400 mg/m2 (Treatment Plan Recorded) Intravenous 1 day or 1 dose Lloyd Huger, MD   3,500 mg at 11/16/20 1255  . heparin lock flush 100 unit/mL  500 Units Intravenous Once Lloyd Huger, MD      . heparin lock flush 100 unit/mL  500 Units Intracatheter PRN Lloyd Huger, MD      . sodium chloride flush (NS) 0.9 % injection 10 mL  10 mL Intravenous PRN Lloyd Huger, MD   10 mL at 12/04/17 0901  . sodium chloride flush (NS) 0.9 % injection 10 mL  10 mL Intracatheter PRN Lloyd Huger, MD      . sodium chloride flush (NS) 0.9 % injection 10 mL  10 mL Intracatheter PRN Lloyd Huger, MD   10 mL at 11/16/20 0933  . yttrium-90 injection 92.9 millicurie  57.4 millicurie Intravenous Once [name], MD        OBJECTIVE: Vitals:   11/16/20 0840 11/16/20 0841  BP:  138/70  Pulse:  81  Resp: 20   Temp: (!) 97.3 F (36.3 C)      Body mass index is 23.81 kg/m.    ECOG FS:0 - Asymptomatic  General: Well-developed, well-nourished, no acute distress. Eyes: Pink conjunctiva, anicteric sclera. HEENT: Normocephalic, moist mucous membranes. Lungs: No audible wheezing or coughing. Heart: Regular rate and rhythm. Abdomen: Soft, nontender, no obvious distention. Musculoskeletal: No edema, cyanosis, or clubbing. Neuro: Alert, answering all questions appropriately. Cranial nerves grossly intact. Skin: No rashes or petechiae noted. Psych: Normal affect. Lymphatics: No palpable lymphadenopathy.  LAB RESULTS:  Lab Results  Component Value Date   NA 139 11/16/2020   K 3.7 11/16/2020   CL 106 11/16/2020   CO2 24 11/16/2020   GLUCOSE 81 11/16/2020  BUN 17 11/16/2020   CREATININE 0.62 11/16/2020   CALCIUM 8.7 (L) 11/16/2020   PROT 6.8 11/16/2020   ALBUMIN 3.9 11/16/2020   AST 19 11/16/2020   ALT 14 11/16/2020   ALKPHOS 50 11/16/2020   BILITOT 0.5 11/16/2020   GFRNONAA >60 11/16/2020   GFRAA >60 06/28/2020    Lab Results  Component Value Date   WBC 5.9 11/16/2020   NEUTROABS 2.9 11/16/2020   HGB 12.7 11/16/2020   HCT 38.6 11/16/2020   MCV 95.5 11/16/2020   PLT 169 11/16/2020     STUDIES: No results found.   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  PLAN:    1.  Possible stage IV rectal cancer: Biopsy confirmed rectal cancer.  [facility] completed at [facility] on October 11, 2020 revealed extension of the tumor beyond the wall into the rectovaginal recess with suspected invasion of the vaginal wall posteriorly.  Tumor also appears to involve the internal anal sphincter.  There are also numerous prominent presacral and mesorectal lymph nodes highly suspicious for malignancy.  CT scan results from [facility] on October 13, 2020 revealed multiple pulmonary nodules in the right lower lobe highly suspicious for metastasis the index right lower lobe nodule measures 9 mm a second nodule in the right lower lobe measures 6 mm.  PET scan is scheduled for tomorrow.  Patient is unlikely a surgical candidate, but will plan on giving 6 cycles of neoadjuvant FOLFOX and then reconsider surgery and radiation at that time.  Proceed with cycle 1 of treatment today.  Return to clinic in 2 days for pump removal, 1 week for laboratory work and evaluation, and then 2 weeks for further evaluation and consideration of cycle 2.   2. Recurrent follicular lymphoma: CT scan results from December 23, 2019 reviewed independently with no obvious evidence of recurrent or progressive disease.  Recent imaging from [facility] as above with no evidence of lymphoma.  Patient received Zevalin on July 30, 2017 with not much therapeutic effect.  She subsequently underwent cycles 5 of R-CHOP chemotherapy with Neulasta support completing on December 12, 2017.  Patient continues to be in complete remission.    I spent a total of 30 minutes reviewing chart data, face-to-face evaluation with the patient, counseling and coordination of care as detailed above.  Patient expressed understanding and was in agreement with this plan. She also understands that She can call clinic at any time with any questions, concerns, or complaints.   Cancer Staging 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 [facility]) Staging form: Lymphoid Neoplasms, AJCC 6th Edition - Clinical stage from 08/27/2016: Stage IIE (lymphoma only) - Signed by Lloyd Huger, MD on 08/27/2016  Rectal cancer [facility]) Staging form: Colon and Rectum, AJCC 8th Edition - Clinical: Stage IVA Larwance Sachs, Noelle.Marry) - Signed by Lloyd Huger, MD on 11/02/2020   Lloyd Huger, MD   11/16/2020 3:47 PM

## 2020-11-15 ENCOUNTER — Other Ambulatory Visit: Payer: Self-pay | Admitting: Obstetrics and Gynecology

## 2020-11-15 DIAGNOSIS — Z85048 Personal history of other malignant neoplasm of rectum, rectosigmoid junction, and anus: Secondary | ICD-10-CM

## 2020-11-16 ENCOUNTER — Inpatient Hospital Stay: Payer: Medicare Other

## 2020-11-16 ENCOUNTER — Inpatient Hospital Stay (HOSPITAL_BASED_OUTPATIENT_CLINIC_OR_DEPARTMENT_OTHER): Payer: Medicare Other | Admitting: Oncology

## 2020-11-16 ENCOUNTER — Encounter: Payer: Self-pay | Admitting: Oncology

## 2020-11-16 VITALS — BP 138/70 | HR 81 | Temp 97.3°F | Resp 20 | Wt 113.9 lb

## 2020-11-16 DIAGNOSIS — C20 Malignant neoplasm of rectum: Secondary | ICD-10-CM

## 2020-11-16 DIAGNOSIS — Z5111 Encounter for antineoplastic chemotherapy: Secondary | ICD-10-CM | POA: Diagnosis present

## 2020-11-16 DIAGNOSIS — E876 Hypokalemia: Secondary | ICD-10-CM | POA: Diagnosis not present

## 2020-11-16 LAB — COMPREHENSIVE METABOLIC PANEL
ALT: 14 U/L (ref 0–44)
AST: 19 U/L (ref 15–41)
Albumin: 3.9 g/dL (ref 3.5–5.0)
Alkaline Phosphatase: 50 U/L (ref 38–126)
Anion gap: 9 (ref 5–15)
BUN: 17 mg/dL (ref 8–23)
CO2: 24 mmol/L (ref 22–32)
Calcium: 8.7 mg/dL — ABNORMAL LOW (ref 8.9–10.3)
Chloride: 106 mmol/L (ref 98–111)
Creatinine, Ser: 0.62 mg/dL (ref 0.44–1.00)
GFR, Estimated: >60 mL/min (ref >60)
Glucose, Bld: 81 mg/dL (ref 70–99)
Potassium: 3.7 mmol/L (ref 3.5–5.1)
Sodium: 139 mmol/L (ref 135–145)
Total Bilirubin: 0.5 mg/dL (ref 0.3–1.2)
Total Protein: 6.8 g/dL (ref 6.5–8.1)

## 2020-11-16 LAB — CBC WITH DIFFERENTIAL/PLATELET
Abs Immature Granulocytes: 0.01 10*3/uL (ref 0.00–0.07)
Basophils Absolute: 0 10*3/uL (ref 0.0–0.1)
Basophils Relative: 1 %
Eosinophils Absolute: 0.1 10*3/uL (ref 0.0–0.5)
Eosinophils Relative: 2 %
HCT: 38.6 % (ref 36.0–46.0)
Hemoglobin: 12.7 g/dL (ref 12.0–15.0)
Immature Granulocytes: 0 %
Lymphocytes Relative: 34 %
Lymphs Abs: 2 10*3/uL (ref 0.7–4.0)
MCH: 31.4 pg (ref 26.0–34.0)
MCHC: 32.9 g/dL (ref 30.0–36.0)
MCV: 95.5 fL (ref 80.0–100.0)
Monocytes Absolute: 0.7 10*3/uL (ref 0.1–1.0)
Monocytes Relative: 13 %
Neutro Abs: 2.9 10*3/uL (ref 1.7–7.7)
Neutrophils Relative %: 50 %
Platelets: 169 10*3/uL (ref 150–400)
RBC: 4.04 MIL/uL (ref 3.87–5.11)
RDW: 12.8 % (ref 11.5–15.5)
WBC: 5.9 10*3/uL (ref 4.0–10.5)
nRBC: 0 % (ref 0.0–0.2)

## 2020-11-16 MED ORDER — OXALIPLATIN CHEMO INJECTION 100 MG/20ML
85.0000 mg/m2 | Freq: Once | INTRAVENOUS | Status: AC
  Administered 2020-11-16: 125 mg via INTRAVENOUS
  Filled 2020-11-16: qty 20

## 2020-11-16 MED ORDER — PALONOSETRON HCL INJECTION 0.25 MG/5ML
0.2500 mg | Freq: Once | INTRAVENOUS | Status: AC
  Administered 2020-11-16: 0.25 mg via INTRAVENOUS
  Filled 2020-11-16: qty 5

## 2020-11-16 MED ORDER — FLUOROURACIL CHEMO INJECTION 2.5 GM/50ML
400.0000 mg/m2 | Freq: Once | INTRAVENOUS | Status: AC
  Administered 2020-11-16: 600 mg via INTRAVENOUS
  Filled 2020-11-16: qty 12

## 2020-11-16 MED ORDER — LEUCOVORIN CALCIUM INJECTION 350 MG
600.0000 mg | Freq: Once | INTRAVENOUS | Status: AC
  Administered 2020-11-16: 600 mg via INTRAVENOUS
  Filled 2020-11-16: qty 30

## 2020-11-16 MED ORDER — SODIUM CHLORIDE 0.9% FLUSH
10.0000 mL | INTRAVENOUS | Status: DC | PRN
  Administered 2020-11-16: 10 mL
  Filled 2020-11-16: qty 10

## 2020-11-16 MED ORDER — DEXTROSE 5 % IV SOLN
Freq: Once | INTRAVENOUS | Status: AC
  Filled 2020-11-16: qty 250

## 2020-11-16 MED ORDER — SODIUM CHLORIDE 0.9 % IV SOLN
10.0000 mg | Freq: Once | INTRAVENOUS | Status: AC
  Administered 2020-11-16: 10 mg via INTRAVENOUS
  Filled 2020-11-16: qty 10

## 2020-11-16 MED ORDER — SODIUM CHLORIDE 0.9 % IV SOLN
2400.0000 mg/m2 | INTRAVENOUS | Status: DC
  Administered 2020-11-16: 3500 mg via INTRAVENOUS
  Filled 2020-11-16: qty 70

## 2020-11-16 NOTE — Progress Notes (Signed)
1305- Patient tolerated treatment well. Patient stable and discharged to home with Fluorouracil Continuous Infusion Pump in place.

## 2020-11-16 NOTE — Progress Notes (Signed)
Patient here today for follow up, treatment consideration regarding rectal cancer. Patient denies any concerns today.

## 2020-11-17 ENCOUNTER — Ambulatory Visit
Admission: RE | Admit: 2020-11-17 | Discharge: 2020-11-17 | Disposition: A | Discharge status: Home / Self Care | Payer: Medicare Other | Source: Ambulatory Visit | Attending: Oncology | Admitting: Oncology

## 2020-11-17 ENCOUNTER — Other Ambulatory Visit: Payer: Self-pay

## 2020-11-17 DIAGNOSIS — Z8572 Personal history of non-Hodgkin lymphomas: Secondary | ICD-10-CM | POA: Diagnosis not present

## 2020-11-17 DIAGNOSIS — C20 Malignant neoplasm of rectum: Secondary | ICD-10-CM | POA: Diagnosis present

## 2020-11-17 DIAGNOSIS — R918 Other nonspecific abnormal finding of lung field: Secondary | ICD-10-CM | POA: Insufficient documentation

## 2020-11-17 LAB — GLUCOSE, CAPILLARY: Glucose-Capillary: 93 mg/dL (ref 70–99)

## 2020-11-17 MED ORDER — FLUDEOXYGLUCOSE F - 18 (FDG) INJECTION
6.1700 | Freq: Once | INTRAVENOUS | Status: AC | PRN
  Administered 2020-11-17: 6.17 via INTRAVENOUS

## 2020-11-18 ENCOUNTER — Telehealth: Payer: Self-pay

## 2020-11-18 ENCOUNTER — Inpatient Hospital Stay: Payer: Medicare Other

## 2020-11-18 DIAGNOSIS — C20 Malignant neoplasm of rectum: Secondary | ICD-10-CM

## 2020-11-18 DIAGNOSIS — Z5111 Encounter for antineoplastic chemotherapy: Secondary | ICD-10-CM | POA: Diagnosis not present

## 2020-11-18 MED ORDER — HEPARIN SOD (PORK) LOCK FLUSH 100 UNIT/ML IV SOLN
INTRAVENOUS | Status: AC
  Filled 2020-11-18: qty 5

## 2020-11-18 MED ORDER — HEPARIN SOD (PORK) LOCK FLUSH 100 UNIT/ML IV SOLN
500.0000 [IU] | Freq: Once | INTRAVENOUS | Status: AC | PRN
Start: 2020-11-18 — End: 2020-11-18
  Administered 2020-11-18: 500 [IU]
  Filled 2020-11-18: qty 5

## 2020-11-18 MED ORDER — SODIUM CHLORIDE 0.9% FLUSH
10.0000 mL | INTRAVENOUS | Status: DC | PRN
  Administered 2020-11-18: 10 mL
  Filled 2020-11-18: qty 10

## 2020-11-18 NOTE — Telephone Encounter (Signed)
Telephone call to patient for follow up after receiving first infusion.   Patient states infusion went great.  States eating good and drinking plenty of fluids.   Denies any nausea or vomiting.  Encouraged patient to call for any concerns or questions. 

## 2020-11-19 NOTE — Progress Notes (Signed)
[place]  Telephone:(336) [phone] Fax:(336) [phone]  ID: Veronica Boyd OB: 11/16/1945  MR#: [id number]  IRJ#:[id number]  Patient Care Team: Tracie Harrier, MD as PCP - General (Internal Medicine) Lloyd Huger, MD as Consulting Physician (Oncology) Leonie Green, MD as Referring Physician (Surgery) [name], MD as Referring Physician (Radiation Oncology) Jacquelin Hawking, NP as Nurse Practitioner (Oncology) Clent Jacks, RN as Oncology Nurse Navigator   CHIEF COMPLAINT: Stage IV rectal cancer, history of follicular lymphoma.  INTERVAL HISTORY: Patient returns to clinic today for repeat laboratory work and to assess her toleration of cycle one of FOLFOX.  She had some increased nausea several days after her treatment, but otherwise tolerated it well.  She also complains of cold neuropathy.  She has noticed no new lymphadenopathy.  She denies any pain.  She has no neurologic complaints. She denies any fevers, night sweats, or weight loss.  She denies any chest pain, shortness of breath, cough, or hemoptysis.  She denies any vomiting, or constipation.  Patient admitted to some hematuria, but none recently.  Patient offers no further specific complaints today.  REVIEW OF SYSTEMS:   Review of Systems  Constitutional: Negative.  Negative for fever, malaise/fatigue and weight loss.  Respiratory: Negative.  Negative for cough, hemoptysis and shortness of breath.   Cardiovascular: Negative.  Negative for chest pain and leg swelling.  Gastrointestinal: Positive for nausea. Negative for abdominal pain, blood in stool, melena and vomiting.  Genitourinary: Negative.  Negative for dysuria.  Musculoskeletal: Negative.  Negative for back pain, joint pain and neck pain.  Skin: Negative.  Negative for rash.  Neurological: Positive for sensory change. Negative for focal weakness, weakness and headaches.  Psychiatric/Behavioral: Negative.  The patient  is not nervous/anxious and does not have insomnia.    As per HPI. Otherwise, a complete review of systems is negative.   PAST MEDICAL HISTORY: Past Medical History:  Diagnosis Date  . Arthritis   . Cataract    bilateral  . Chicken pox   . Colon polyp   . Follicular lymphoma ([place]) 08/2016   lymph nodes   . Hyperlipidemia   . Osteoporosis     PAST SURGICAL HISTORY: Past Surgical History:  Procedure Laterality Date  . AXILLARY LYMPH NODE DISSECTION Right 08/21/2016   Procedure: AXILLARY LYMPH NODE excision;  Surgeon: Leonie Green, MD;  Location: [facility] ORS;  Service: General;  Laterality: Right;  . CATARACT EXTRACTION, BILATERAL Bilateral   . COLONOSCOPY N/A 09/19/2020   Procedure: COLONOSCOPY;  Surgeon: Lesly Rubenstein, MD;  Location: [facility] ENDOSCOPY;  Service: Endoscopy;  Laterality: N/A;  . EYE SURGERY    . JOINT REPLACEMENT Left   . PORTA CATH INSERTION N/A 09/16/2017   Procedure: PORTA CATH INSERTION;  Surgeon: Algernon Huxley, MD;  Location: [place] [facility];  Service: Cardiovascular;  Laterality: N/A;  . TONSILLECTOMY    . TOTAL HIP ARTHROPLASTY Left 1992    FAMILY HISTORY: Family History  Problem Relation Age of Onset  . Diabetes Sister   . Lung cancer Brother   . Diabetes Brother   . Basal cell carcinoma Daughter   . Breast cancer Paternal Aunt     ADVANCED DIRECTIVES (Y/N):  N  HEALTH MAINTENANCE: Social History   Tobacco Use  . Smoking status: Never Smoker  . Smokeless tobacco: Never Used  Vaping Use  . Vaping Use: Never used  Substance Use Topics  . Alcohol use: No  . Drug use: No  Colonoscopy:  PAP:  Bone density:  Lipid panel:  No Known Allergies  Current Outpatient Medications  Medication Sig Dispense Refill  . alendronate (FOSAMAX) 70 MG tablet TAKE 1 TABLET EVERY 7 DAYS TAKE WITH A FULL GLASS OF WATER. DO NOT LIE DOWN FOR THE NEXT 30 MIN.  3  . diclofenac sodium (VOLTAREN) 1 % GEL APPLY 2 GRAMS TOPICALLY 2 (TWO) TIMES  DAILY    . lidocaine-prilocaine (EMLA) cream APPLY 1 APPLICATION TOPICALLY AS NEEDED. 30 g 1  . lidocaine-prilocaine (EMLA) cream Apply to affected area once 30 g 3  . Multiple Vitamins-Minerals (CENTRUM SILVER PO) Take 1 tablet by mouth daily.    . ondansetron (ZOFRAN) 8 MG tablet Take 1 tablet (8 mg total) by mouth 2 (two) times daily as needed for refractory nausea / vomiting. Start on day 3 after chemotherapy. 60 tablet 1  . pantoprazole (PROTONIX) 20 MG tablet Take 20 mg by mouth at bedtime.     . prochlorperazine (COMPAZINE) 10 MG tablet Take 1 tablet (10 mg total) by mouth every 6 (six) hours as needed (Nausea or vomiting). 60 tablet 1  . simvastatin (ZOCOR) 20 MG tablet TAKE 1 TABLET BY MOUTH EVERY DAY AT NIGHT    . tizanidine (ZANAFLEX) 2 MG capsule Take 2 mg by mouth 3 (three) times daily.    . traMADol-acetaminophen (ULTRACET) 37.5-325 MG tablet Take 1 tablet by mouth every 8 (eight) hours as needed.      No current facility-administered medications for this visit.   Facility-Administered Medications Ordered in Other Visits  Medication Dose Route Frequency Provider Last Rate Last Admin  . heparin lock flush 100 unit/mL  500 Units Intravenous Once Lloyd Huger, MD      . heparin lock flush 100 unit/mL  500 Units Intracatheter PRN Lloyd Huger, MD      . sodium chloride flush (NS) 0.9 % injection 10 mL  10 mL Intravenous PRN Lloyd Huger, MD   10 mL at 12/04/17 0901  . sodium chloride flush (NS) 0.9 % injection 10 mL  10 mL Intracatheter PRN Lloyd Huger, MD      . yttrium-90 injection [id number] millicurie  99991111 millicurie Intravenous Once [name], MD        OBJECTIVE: There were no vitals filed for this visit.   There is no height or weight on file to calculate BMI.    ECOG FS:0 - Asymptomatic  General: Well-developed, well-nourished, no acute distress. Eyes: Pink conjunctiva, anicteric sclera. HEENT: Normocephalic, moist mucous membranes. Lungs:  No audible wheezing or coughing. Heart: Regular rate and rhythm. Abdomen: Soft, nontender, no obvious distention. Musculoskeletal: No edema, cyanosis, or clubbing. Neuro: Alert, answering all questions appropriately. Cranial nerves grossly intact. Skin: No rashes or petechiae noted. Psych: Normal affect. Lymphatics: No palpable lymphadenopathy.  LAB RESULTS:  Lab Results  Component Value Date   NA 139 11/16/2020   K 3.7 11/16/2020   CL 106 11/16/2020   CO2 24 11/16/2020   GLUCOSE 81 11/16/2020   BUN 17 11/16/2020   CREATININE 0.62 11/16/2020   CALCIUM 8.7 (L) 11/16/2020   PROT 6.8 11/16/2020   ALBUMIN 3.9 11/16/2020   AST 19 11/16/2020   ALT 14 11/16/2020   ALKPHOS 50 11/16/2020   BILITOT 0.5 11/16/2020   GFRNONAA >60 11/16/2020   GFRAA >60 06/28/2020    Lab Results  Component Value Date   WBC 5.9 11/16/2020   NEUTROABS 2.9 11/16/2020   HGB 12.7 11/16/2020   HCT  38.6 11/16/2020   MCV 95.5 11/16/2020   PLT 169 11/16/2020     STUDIES: NM PET Image Initial (PI) Skull Base To Thigh  Result Date: 11/17/2020 CLINICAL DATA:  Initial treatment strategy for colorectal carcinoma. Rectal carcinoma. Additional history of follicular lymphoma. EXAM: NUCLEAR MEDICINE PET SKULL BASE TO THIGH TECHNIQUE: 6.2 mCi F-18 FDG was injected intravenously. Full-ring PET imaging was performed from the skull base to thigh after the radiotracer. CT data was obtained and used for attenuation correction and anatomic localization. Fasting blood glucose: 93 mg/dl COMPARISON:  CT 12/23/2019 FINDINGS: Mediastinal blood pool activity: SUV max 1.6 Liver activity: SUV max 7 NECK: No hypermetabolic lymph nodes in the neck. Incidental CT findings: none CHEST: Rounded nodule in the RIGHT lower lobe measuring 10 mm on image 76 and has hypermetabolic activity with SUV max equal 2.8. This nodule is new from CT 12/23/2019. Two additional smaller nodules measuring 4 mm on image 89 and 7 mm on image 95 do not clear  radiotracer activity but are suspicious for metastasis No mediastinal nodal metastasis identified Incidental CT findings: none ABDOMEN/PELVIS: Intense activity within the distal rectum with SUV max equal 23.5. Intense activity extends over approximately 4 cm segment of the distal rectum leading up to the internal anal sphincter. No hypermetabolic perirectal lymph nodes. No hypermetabolic lesions in the liver. No hypermetabolic retroperitoneal nodes. Incidental CT findings: none SKELETON: No focal hypermetabolic activity to suggest skeletal metastasis. Incidental CT findings: none IMPRESSION: 1. Intense metabolic activity within the distal rectum consistent with history of rectal carcinoma. 2. Three new pulmonary nodules in the RIGHT lower lobe the largest of which is hypermetabolic is consistent with rectal carcinoma pulmonary metastasis. 3. No nodal metastasis identified. Electronically Signed   By: Suzy Bouchard M.D.   On: 11/17/2020 13:39     ASSESSMENT: Stage IV rectal cancer, history of follicular lymphoma. PLAN:    1.  Stage IV rectal cancer: Biopsy confirmed rectal cancer.  MRI completed at [facility] on October 11, 2020 revealed extension of the tumor beyond the wall into the rectovaginal recess with suspected invasion of the vaginal wall posteriorly.  Tumor also appears to involve the internal anal sphincter.  There are also numerous prominent presacral and mesorectal lymph nodes highly suspicious for malignancy.  PET scan results from November 17, 2020 reviewed independently and reported as above with 3 hypermetabolic right lower lobe lung nodules consistent with pulmonary metastasis.  Patient is unlikely a surgical candidate, but will plan on giving 6 cycles of neoadjuvant FOLFOX and then reconsider surgery and radiation at that time.  Patient tolerated cycle one of treatment last week well without significant side effects.  Return to clinic in 1 week for further evaluation and consideration  of cycle 2.  2. Recurrent follicular lymphoma: CT scan results from December 23, 2019 reviewed independently with no obvious evidence of recurrent or progressive disease.  PET scan results as above consistent with rectal cancer metastasis and no evidence of lymphoma.   Patient received Zevalin on July 30, 2017 with not much therapeutic effect.  She subsequently underwent cycles 5 of R-CHOP chemotherapy with Neulasta support completing on December 12, 2017.  Patient continues to be in complete remission.  3.  Hypokalemia: Encourage dietary changes.  I spent a total of 20 minutes reviewing chart data, face-to-face evaluation with the patient, counseling and coordination of care as detailed above.  Patient expressed understanding and was in agreement with this plan. She also understands that She can call clinic  at any time with any questions, concerns, or complaints.   Cancer Staging 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 [facility]) Staging form: Lymphoid Neoplasms, AJCC 6th Edition - Clinical stage from 08/27/2016: Stage IIE (lymphoma only) - Signed by Lloyd Huger, MD on 08/27/2016  Rectal cancer [facility]) Staging form: Colon and Rectum, AJCC 8th Edition - Clinical: Stage IVA Larwance Sachs, Noelle.Marry) - Signed by Lloyd Huger, MD on 11/02/2020   Lloyd Huger, MD   11/19/2020 8:04 AM

## 2020-11-21 ENCOUNTER — Ambulatory Visit: Admission: RE | Admit: 2020-11-21 | Payer: Medicare Other | Source: Ambulatory Visit

## 2020-11-23 ENCOUNTER — Inpatient Hospital Stay: Payer: Medicare Other

## 2020-11-23 ENCOUNTER — Encounter: Payer: Self-pay | Admitting: Oncology

## 2020-11-23 ENCOUNTER — Inpatient Hospital Stay (HOSPITAL_BASED_OUTPATIENT_CLINIC_OR_DEPARTMENT_OTHER): Payer: Medicare Other | Admitting: Oncology

## 2020-11-23 VITALS — BP 127/73 | HR 84 | Temp 98.7°F | Resp 20 | Wt 109.7 lb

## 2020-11-23 DIAGNOSIS — C20 Malignant neoplasm of rectum: Secondary | ICD-10-CM | POA: Diagnosis not present

## 2020-11-23 DIAGNOSIS — Z5111 Encounter for antineoplastic chemotherapy: Secondary | ICD-10-CM | POA: Diagnosis not present

## 2020-11-23 LAB — CBC WITH DIFFERENTIAL/PLATELET
Abs Immature Granulocytes: 0.01 10*3/uL (ref 0.00–0.07)
Basophils Absolute: 0 10*3/uL (ref 0.0–0.1)
Basophils Relative: 1 %
Eosinophils Absolute: 0.2 10*3/uL (ref 0.0–0.5)
Eosinophils Relative: 3 %
HCT: 37.7 % (ref 36.0–46.0)
Hemoglobin: 13.1 g/dL (ref 12.0–15.0)
Immature Granulocytes: 0 %
Lymphocytes Relative: 47 %
Lymphs Abs: 2.7 10*3/uL (ref 0.7–4.0)
MCH: 32.3 pg (ref 26.0–34.0)
MCHC: 34.7 g/dL (ref 30.0–36.0)
MCV: 92.9 fL (ref 80.0–100.0)
Monocytes Absolute: 0.5 10*3/uL (ref 0.1–1.0)
Monocytes Relative: 9 %
Neutro Abs: 2.2 10*3/uL (ref 1.7–7.7)
Neutrophils Relative %: 40 %
Platelets: 141 10*3/uL — ABNORMAL LOW (ref 150–400)
RBC: 4.06 MIL/uL (ref 3.87–5.11)
RDW: 12.3 % (ref 11.5–15.5)
WBC: 5.6 10*3/uL (ref 4.0–10.5)
nRBC: 0 % (ref 0.0–0.2)

## 2020-11-23 LAB — COMPREHENSIVE METABOLIC PANEL
ALT: 14 U/L (ref 0–44)
AST: 38 U/L (ref 15–41)
Albumin: 4.1 g/dL (ref 3.5–5.0)
Alkaline Phosphatase: 50 U/L (ref 38–126)
Anion gap: 9 (ref 5–15)
BUN: 16 mg/dL (ref 8–23)
CO2: 25 mmol/L (ref 22–32)
Calcium: 9.1 mg/dL (ref 8.9–10.3)
Chloride: 105 mmol/L (ref 98–111)
Creatinine, Ser: 0.82 mg/dL (ref 0.44–1.00)
GFR, Estimated: >60 mL/min (ref >60)
Glucose, Bld: 90 mg/dL (ref 70–99)
Potassium: 3.2 mmol/L — ABNORMAL LOW (ref 3.5–5.1)
Sodium: 139 mmol/L (ref 135–145)
Total Bilirubin: 0.6 mg/dL (ref 0.3–1.2)
Total Protein: 6.6 g/dL (ref 6.5–8.1)

## 2020-11-23 MED ORDER — HEPARIN SOD (PORK) LOCK FLUSH 100 UNIT/ML IV SOLN
500.0000 [IU] | Freq: Once | INTRAVENOUS | Status: AC
  Administered 2020-11-23: 500 [IU] via INTRAVENOUS
  Filled 2020-11-23: qty 5

## 2020-11-23 MED ORDER — HEPARIN SOD (PORK) LOCK FLUSH 100 UNIT/ML IV SOLN
INTRAVENOUS | Status: AC
  Filled 2020-11-23: qty 5

## 2020-11-23 MED ORDER — SODIUM CHLORIDE 0.9% FLUSH
10.0000 mL | INTRAVENOUS | Status: DC | PRN
  Administered 2020-11-23: 10 mL via INTRAVENOUS
  Filled 2020-11-23: qty 10

## 2020-11-26 NOTE — Progress Notes (Signed)
[place]  [phone] [phone]  ID: Veronica Boyd OB: [birth year]  MR#: [id number]  [id number]  Patient Care Team: Tracie Harrier, MD as PCP - General (Internal Medicine) Lloyd Huger, MD as Consulting Physician (Oncology) Leonie Green, MD as Referring Physician (Surgery) [name], MD as Referring Physician (Radiation Oncology) Jacquelin Hawking, NP as Nurse Practitioner (Oncology) Clent Jacks, RN as Oncology Nurse Navigator   CHIEF COMPLAINT: Stage IV rectal cancer, history of follicular lymphoma.  INTERVAL HISTORY: Patient returns to clinic today for further evaluation and consideration of cycle 2 of FOLFOX. She currently feels well and is back to her baseline. Her nausea and cold neuropathy have resolved.  She denies any pain.  She has no neurologic complaints. She denies any fevers, night sweats, or weight loss.  She denies any chest pain, shortness of breath, cough, or hemoptysis. She denies any nausea, vomiting, constipation, or diarrhea. She has no urinary complaints. Patient offers no specific complaints today.   REVIEW OF SYSTEMS:   Review of Systems  Constitutional: Negative.  Negative for fever, malaise/fatigue and weight loss.  Respiratory: Negative.  Negative for cough, hemoptysis and shortness of breath.   Cardiovascular: Negative.  Negative for chest pain and leg swelling.  Gastrointestinal: Negative.  Negative for abdominal pain, blood in stool, melena, nausea and vomiting.  Genitourinary: Negative.  Negative for dysuria.  Musculoskeletal: Negative.  Negative for back pain, joint pain and neck pain.  Skin: Negative.  Negative for rash.  Neurological: Negative.  Negative for sensory change, focal weakness, weakness and headaches.  Psychiatric/Behavioral: Negative.  The patient is not nervous/anxious and does not have insomnia.    As per HPI. Otherwise, a complete review of systems is  negative.   PAST MEDICAL HISTORY: Past Medical History:  Diagnosis Date  . Arthritis   . Cataract    bilateral  . Chicken pox   . Colon polyp   . Follicular lymphoma ([place]) 08/2016   lymph nodes   . Hyperlipidemia   . Osteoporosis     PAST SURGICAL HISTORY: Past Surgical History:  Procedure Laterality Date  . AXILLARY LYMPH NODE DISSECTION Right 08/21/2016   Procedure: AXILLARY LYMPH NODE excision;  Surgeon: Leonie Green, MD;  Location: [facility];  Service: General;  Laterality: Right;  . CATARACT EXTRACTION, BILATERAL Bilateral   . COLONOSCOPY N/A 09/19/2020   Procedure: COLONOSCOPY;  Surgeon: Lesly Rubenstein, MD;  Location: [facility];  Service: Endoscopy;  Laterality: N/A;  . EYE SURGERY    . JOINT REPLACEMENT Left   . PORTA CATH INSERTION N/A 09/16/2017   Procedure: PORTA CATH INSERTION;  Surgeon: Algernon Huxley, MD;  Location: [facility];  Service: Cardiovascular;  Laterality: N/A;  . TONSILLECTOMY    . TOTAL HIP ARTHROPLASTY Left 1992    FAMILY HISTORY: Family History  Problem Relation Age of Onset  . Diabetes Sister   . Lung cancer Brother   . Diabetes Brother   . Basal cell carcinoma Daughter   . Breast cancer Paternal Aunt     ADVANCED DIRECTIVES (Y/N):  N  HEALTH MAINTENANCE: Social History   Tobacco Use  . Smoking status: Never Smoker  . Smokeless tobacco: Never Used  Vaping Use  . Vaping Use: Never used  Substance Use Topics  . Alcohol use: No  . Drug use: No     Colonoscopy:  PAP:  Bone density:  Lipid panel:  No Known Allergies  Current Outpatient Medications  Medication Sig Dispense Refill  . alendronate (FOSAMAX) 70 MG tablet TAKE 1 TABLET EVERY 7 DAYS TAKE WITH A FULL GLASS OF WATER. DO NOT LIE DOWN FOR THE NEXT 30 MIN.  3  . diclofenac sodium (VOLTAREN) 1 % GEL APPLY 2 GRAMS TOPICALLY 2 (TWO) TIMES DAILY    . lidocaine-prilocaine (EMLA) cream APPLY 1 APPLICATION TOPICALLY AS NEEDED. 30 g 1  .  lidocaine-prilocaine (EMLA) cream Apply to affected area once 30 g 3  . Multiple Vitamins-Minerals (CENTRUM SILVER PO) Take 1 tablet by mouth daily.    . ondansetron (ZOFRAN) 8 MG tablet Take 1 tablet (8 mg total) by mouth 2 (two) times daily as needed for refractory nausea / vomiting. Start on day 3 after chemotherapy. 60 tablet 1  . pantoprazole (PROTONIX) 20 MG tablet Take 20 mg by mouth at bedtime.     . prochlorperazine (COMPAZINE) 10 MG tablet Take 1 tablet (10 mg total) by mouth every 6 (six) hours as needed (Nausea or vomiting). 60 tablet 1  . simvastatin (ZOCOR) 20 MG tablet TAKE 1 TABLET BY MOUTH EVERY DAY AT NIGHT    . tizanidine (ZANAFLEX) 2 MG capsule Take 2 mg by mouth 3 (three) times daily.    . traMADol-acetaminophen (ULTRACET) 37.5-325 MG tablet Take 1 tablet by mouth every 8 (eight) hours as needed.      No current facility-administered medications for this visit.   Facility-Administered Medications Ordered in Other Visits  Medication Dose Route Frequency Provider Last Rate Last Admin  . heparin lock flush 100 unit/mL  500 Units Intravenous Once Lloyd Huger, MD      . heparin lock flush 100 unit/mL  500 Units Intracatheter PRN Lloyd Huger, MD      . sodium chloride flush (NS) 0.9 % injection 10 mL  10 mL Intravenous PRN Lloyd Huger, MD   10 mL at 12/04/17 0901  . sodium chloride flush (NS) 0.9 % injection 10 mL  10 mL Intracatheter PRN Lloyd Huger, MD      . yttrium-90 injection [id number] millicurie  99991111 millicurie Intravenous Once Chrystal, Eulas Post, MD        OBJECTIVE: Vitals:   11/30/20 0847  BP: 132/64  Pulse: 91  Resp: 18  Temp: (!) 97 F (36.1 C)  SpO2: 98%     Body mass index is 23.2 kg/m.    ECOG FS:0 - Asymptomatic  General: Well-developed, well-nourished, no acute distress. Eyes: Pink conjunctiva, anicteric sclera. HEENT: Normocephalic, moist mucous membranes. Lungs: No audible wheezing or coughing. Heart: Regular rate and  rhythm. Abdomen: Soft, nontender, no obvious distention. Musculoskeletal: No edema, cyanosis, or clubbing. Neuro: Alert, answering all questions appropriately. Cranial nerves grossly intact. Skin: No rashes or petechiae noted. Psych: Normal affect.  LAB RESULTS:  Lab Results  Component Value Date   NA 139 11/30/2020   K 3.6 11/30/2020   CL 107 11/30/2020   CO2 23 11/30/2020   GLUCOSE 116 (H) 11/30/2020   BUN 9 11/30/2020   CREATININE 0.58 11/30/2020   CALCIUM 9.1 11/30/2020   PROT 6.8 11/30/2020   ALBUMIN 3.9 11/30/2020   AST 20 11/30/2020   ALT 15 11/30/2020   ALKPHOS 55 11/30/2020   BILITOT 0.5 11/30/2020   GFRNONAA >60 11/30/2020   GFRAA >60 06/28/2020    Lab Results  Component Value Date   WBC 3.8 (L) 11/30/2020   NEUTROABS 1.2 (L) 11/30/2020   HGB 13.0 11/30/2020   HCT 37.9 11/30/2020   MCV 94.0 11/30/2020  PLT 147 (L) 11/30/2020     STUDIES: NM PET Image Initial (PI) Skull Base To Thigh  Result Date: 11/17/2020 CLINICAL DATA:  Initial treatment strategy for colorectal carcinoma. Rectal carcinoma. Additional history of follicular lymphoma. EXAM: NUCLEAR MEDICINE PET SKULL BASE TO THIGH TECHNIQUE: 6.2 mCi F-18 FDG was injected intravenously. Full-ring PET imaging was performed from the skull base to thigh after the radiotracer. CT data was obtained and used for attenuation correction and anatomic localization. Fasting blood glucose: 93 mg/dl COMPARISON:  CT 12/23/2019 FINDINGS: Mediastinal blood pool activity: SUV max 1.6 Liver activity: SUV max 7 NECK: No hypermetabolic lymph nodes in the neck. Incidental CT findings: none CHEST: Rounded nodule in the RIGHT lower lobe measuring 10 mm on image 76 and has hypermetabolic activity with SUV max equal 2.8. This nodule is new from CT 12/23/2019. Two additional smaller nodules measuring 4 mm on image 89 and 7 mm on image 95 do not clear radiotracer activity but are suspicious for metastasis No mediastinal nodal metastasis  identified Incidental CT findings: none ABDOMEN/PELVIS: Intense activity within the distal rectum with SUV max equal 23.5. Intense activity extends over approximately 4 cm segment of the distal rectum leading up to the internal anal sphincter. No hypermetabolic perirectal lymph nodes. No hypermetabolic lesions in the liver. No hypermetabolic retroperitoneal nodes. Incidental CT findings: none SKELETON: No focal hypermetabolic activity to suggest skeletal metastasis. Incidental CT findings: none IMPRESSION: 1. Intense metabolic activity within the distal rectum consistent with history of rectal carcinoma. 2. Three new pulmonary nodules in the RIGHT lower lobe the largest of which is hypermetabolic is consistent with rectal carcinoma pulmonary metastasis. 3. No nodal metastasis identified. Electronically Signed   By: Suzy Bouchard M.D.   On: 11/17/2020 13:39   US PELVIC COMPLETE WITH TRANSVAGINAL  Result Date: 11/28/2020 CLINICAL DATA:  Rectal cancer, low rectal cancer abutting and questionably invading posterior vaginal wall on MR, T4 node, postmenopausal EXAM: TRANSABDOMINAL AND TRANSVAGINAL ULTRASOUND OF PELVIS TECHNIQUE: Both transabdominal and transvaginal ultrasound examinations of the pelvis were performed. Transabdominal technique was performed for global imaging of the pelvis including uterus, ovaries, adnexal regions, and pelvic cul-de-sac. It was necessary to proceed with endovaginal exam following the transabdominal exam to visualize the uterus, endometrium, and ovaries as well as vagina. COMPARISON:  PET-CT 11/17/2020 FINDINGS: Uterus Measurements: 3.3 x 2.8 x 3.5 cm = volume: 17 mL. Atrophic. Mildly heterogeneous. No focal mass. Endometrium Thickness: Not well-defined. Questionably thickened 9 mm thick. No endometrial fluid Right ovary Not visualized, question atrophic versus obscured by bowel Left ovary Not visualized, question atrophic versus obscured by bowel Other findings No free pelvic  fluid. No adnexal masses. Due to bowel, unable to adequately evaluate adnexa. No gross cervical or vaginal abnormality identified. IMPRESSION: Nonvisualization of ovaries. Questionably thickened endometrial complex 9 mm thick; Endometrial thickness is considered abnormal for an asymptomatic post-menopausal female and endometrial sampling should be considered to exclude carcinoma. Electronically Signed   By: Lavonia Dana M.D.   On: 11/28/2020 16:24     ASSESSMENT: Stage IV rectal cancer, history of follicular lymphoma. PLAN:    1.  Stage IV rectal cancer: Biopsy confirmed rectal cancer.  MRI completed at [facility] on October 11, 2020 revealed extension of the tumor beyond the wall into the rectovaginal recess with suspected invasion of the vaginal wall posteriorly.  Tumor also appears to involve the internal anal sphincter.  There are also numerous prominent presacral and mesorectal lymph nodes highly suspicious for malignancy.  PET scan  results from November 17, 2020 reviewed independently with 3 hypermetabolic right lower lobe lung nodules consistent with pulmonary metastasis.  Patient is unlikely a surgical candidate, but will plan on giving 6 cycles of neoadjuvant FOLFOX and then reconsider surgery and radiation at that time. Patient tolerated cycle 1 of treatment without significant side effects. Proceed with cycle 2 of FOLFOX today. Return to clinic in 2 days for pump removal and then in 2 weeks for further evaluation and consideration of cycle 3. 2. Recurrent follicular lymphoma: CT scan results from December 23, 2019 reviewed independently with no obvious evidence of recurrent or progressive disease.  PET scan results as above consistent with rectal cancer metastasis and no evidence of lymphoma.   Patient received Zevalin on July 30, 2017 with not much therapeutic effect.  She subsequently underwent cycles 5 of R-CHOP chemotherapy with Neulasta support completing on December 12, 2017.  Patient  continues to be in complete remission.  3.  Hypokalemia: Resolved. 4. Leukopenia: Mild, monitor. 5. Thrombocytopenia: Mild, monitor.  Patient expressed understanding and was in agreement with this plan. She also understands that She can call clinic at any time with any questions, concerns, or complaints.   Cancer Staging 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 [facility]) Staging form: Lymphoid Neoplasms, AJCC 6th Edition - Clinical stage from 08/27/2016: Stage IIE (lymphoma only) - Signed by Lloyd Huger, MD on 08/27/2016  Rectal cancer [facility]) Staging form: Colon and Rectum, AJCC 8th Edition - Clinical: Stage IVA Larwance Sachs, Noelle.Marry) - Signed by Lloyd Huger, MD on 11/02/2020   Lloyd Huger, MD   12/01/2020 6:03 AM

## 2020-11-28 ENCOUNTER — Ambulatory Visit
Admission: RE | Admit: 2020-11-28 | Discharge: 2020-11-28 | Disposition: A | Discharge status: Home / Self Care | Payer: Medicare Other | Source: Ambulatory Visit | Attending: Obstetrics and Gynecology | Admitting: Obstetrics and Gynecology

## 2020-11-28 ENCOUNTER — Other Ambulatory Visit: Payer: Self-pay

## 2020-11-28 DIAGNOSIS — Z85048 Personal history of other malignant neoplasm of rectum, rectosigmoid junction, and anus: Secondary | ICD-10-CM

## 2020-11-30 ENCOUNTER — Encounter: Payer: Self-pay | Admitting: Oncology

## 2020-11-30 ENCOUNTER — Inpatient Hospital Stay (HOSPITAL_BASED_OUTPATIENT_CLINIC_OR_DEPARTMENT_OTHER): Payer: Medicare Other | Admitting: Oncology

## 2020-11-30 ENCOUNTER — Inpatient Hospital Stay: Payer: Medicare Other

## 2020-11-30 VITALS — BP 132/64 | HR 91 | Temp 97.0°F | Resp 18 | Wt 111.0 lb

## 2020-11-30 DIAGNOSIS — C20 Malignant neoplasm of rectum: Secondary | ICD-10-CM | POA: Diagnosis not present

## 2020-11-30 DIAGNOSIS — Z5111 Encounter for antineoplastic chemotherapy: Secondary | ICD-10-CM | POA: Diagnosis not present

## 2020-11-30 LAB — COMPREHENSIVE METABOLIC PANEL
ALT: 15 U/L (ref 0–44)
AST: 20 U/L (ref 15–41)
Albumin: 3.9 g/dL (ref 3.5–5.0)
Alkaline Phosphatase: 55 U/L (ref 38–126)
Anion gap: 9 (ref 5–15)
BUN: 9 mg/dL (ref 8–23)
CO2: 23 mmol/L (ref 22–32)
Calcium: 9.1 mg/dL (ref 8.9–10.3)
Chloride: 107 mmol/L (ref 98–111)
Creatinine, Ser: 0.58 mg/dL (ref 0.44–1.00)
GFR, Estimated: >60 mL/min (ref >60)
Glucose, Bld: 116 mg/dL — ABNORMAL HIGH (ref 70–99)
Potassium: 3.6 mmol/L (ref 3.5–5.1)
Sodium: 139 mmol/L (ref 135–145)
Total Bilirubin: 0.5 mg/dL (ref 0.3–1.2)
Total Protein: 6.8 g/dL (ref 6.5–8.1)

## 2020-11-30 LAB — CBC WITH DIFFERENTIAL/PLATELET
Abs Immature Granulocytes: 0.01 10*3/uL (ref 0.00–0.07)
Basophils Absolute: 0 10*3/uL (ref 0.0–0.1)
Basophils Relative: 1 %
Eosinophils Absolute: 0.1 10*3/uL (ref 0.0–0.5)
Eosinophils Relative: 2 %
HCT: 37.9 % (ref 36.0–46.0)
Hemoglobin: 13 g/dL (ref 12.0–15.0)
Immature Granulocytes: 0 %
Lymphocytes Relative: 43 %
Lymphs Abs: 1.6 10*3/uL (ref 0.7–4.0)
MCH: 32.3 pg (ref 26.0–34.0)
MCHC: 34.3 g/dL (ref 30.0–36.0)
MCV: 94 fL (ref 80.0–100.0)
Monocytes Absolute: 0.9 10*3/uL (ref 0.1–1.0)
Monocytes Relative: 24 %
Neutro Abs: 1.2 10*3/uL — ABNORMAL LOW (ref 1.7–7.7)
Neutrophils Relative %: 30 %
Platelets: 147 10*3/uL — ABNORMAL LOW (ref 150–400)
RBC: 4.03 MIL/uL (ref 3.87–5.11)
RDW: 12.9 % (ref 11.5–15.5)
WBC: 3.8 10*3/uL — ABNORMAL LOW (ref 4.0–10.5)
nRBC: 0 % (ref 0.0–0.2)

## 2020-11-30 MED ORDER — LEUCOVORIN CALCIUM INJECTION 350 MG
600.0000 mg | Freq: Once | INTRAVENOUS | Status: AC
  Administered 2020-11-30: 600 mg via INTRAVENOUS
  Filled 2020-11-30: qty 25

## 2020-11-30 MED ORDER — FLUOROURACIL CHEMO INJECTION 2.5 GM/50ML
400.0000 mg/m2 | Freq: Once | INTRAVENOUS | Status: AC
  Administered 2020-11-30: 600 mg via INTRAVENOUS
  Filled 2020-11-30: qty 12

## 2020-11-30 MED ORDER — PALONOSETRON HCL INJECTION 0.25 MG/5ML
0.2500 mg | Freq: Once | INTRAVENOUS | Status: AC
  Administered 2020-11-30: 0.25 mg via INTRAVENOUS
  Filled 2020-11-30: qty 5

## 2020-11-30 MED ORDER — DEXTROSE 5 % IV SOLN
Freq: Once | INTRAVENOUS | Status: AC
  Filled 2020-11-30: qty 250

## 2020-11-30 MED ORDER — SODIUM CHLORIDE 0.9 % IV SOLN
2400.0000 mg/m2 | INTRAVENOUS | Status: DC
  Administered 2020-11-30: 3500 mg via INTRAVENOUS
  Filled 2020-11-30: qty 70

## 2020-11-30 MED ORDER — SODIUM CHLORIDE 0.9% FLUSH
10.0000 mL | Freq: Once | INTRAVENOUS | Status: AC
  Administered 2020-11-30: 10 mL via INTRAVENOUS
  Filled 2020-11-30: qty 10

## 2020-11-30 MED ORDER — OXALIPLATIN CHEMO INJECTION 100 MG/20ML
85.0000 mg/m2 | Freq: Once | INTRAVENOUS | Status: AC
  Administered 2020-11-30: 125 mg via INTRAVENOUS
  Filled 2020-11-30: qty 20

## 2020-11-30 MED ORDER — SODIUM CHLORIDE 0.9 % IV SOLN
10.0000 mg | Freq: Once | INTRAVENOUS | Status: AC
  Administered 2020-11-30: 10 mg via INTRAVENOUS
  Filled 2020-11-30: qty 10

## 2020-11-30 NOTE — Progress Notes (Signed)
Stable at discharge 

## 2020-12-02 ENCOUNTER — Inpatient Hospital Stay: Payer: Medicare Other

## 2020-12-02 DIAGNOSIS — Z5111 Encounter for antineoplastic chemotherapy: Secondary | ICD-10-CM | POA: Diagnosis not present

## 2020-12-02 DIAGNOSIS — C20 Malignant neoplasm of rectum: Secondary | ICD-10-CM

## 2020-12-02 MED ORDER — HEPARIN SOD (PORK) LOCK FLUSH 100 UNIT/ML IV SOLN
500.0000 [IU] | Freq: Once | INTRAVENOUS | Status: AC | PRN
  Administered 2020-12-02: 500 [IU]
  Filled 2020-12-02: qty 5

## 2020-12-02 MED ORDER — SODIUM CHLORIDE 0.9% FLUSH
10.0000 mL | INTRAVENOUS | Status: DC | PRN
  Administered 2020-12-02: 10 mL
  Filled 2020-12-02: qty 10

## 2020-12-12 ENCOUNTER — Telehealth: Payer: Self-pay | Admitting: Oncology

## 2020-12-12 NOTE — Telephone Encounter (Signed)
Spoke with the patients daughter and got the patient rescheduled to 2/23. [name] agreed with patient being moved up also.

## 2020-12-12 NOTE — Telephone Encounter (Signed)
Left vm requesting call back to reschedule appointments on 2/9 as patient has tested postitive for COVID.  Routing to team for follow up.

## 2020-12-12 NOTE — Telephone Encounter (Signed)
Can we move her up one week to 2/23?  The 21 days for covid positive pts is a recommendation for immunocompromised patients.  She is minimally immunocompromised from FOLFOX, plus with stage IV disease, I would prefer she not go 5-6 weeks in-between treatments.  Thanks!

## 2020-12-12 NOTE — Telephone Encounter (Signed)
Pt has been r\s to 01/04/21 due to positive Covid test.

## 2020-12-12 NOTE — Telephone Encounter (Signed)
Thank you :)

## 2020-12-14 ENCOUNTER — Inpatient Hospital Stay: Payer: Medicare Other

## 2020-12-14 ENCOUNTER — Inpatient Hospital Stay: Payer: Medicare Other | Admitting: Oncology

## 2020-12-25 ENCOUNTER — Other Ambulatory Visit: Payer: Self-pay | Admitting: Oncology

## 2020-12-25 DIAGNOSIS — C20 Malignant neoplasm of rectum: Secondary | ICD-10-CM

## 2020-12-25 NOTE — Progress Notes (Signed)
[place]  [phone] [phone]) [phone]  ID: Veronica Boyd OB: 03-Apr-1946  MR#: [id number]  QMG#:[id number]  Patient Care Team: Tracie Harrier, MD as PCP - General (Internal Medicine) Lloyd Huger, MD as Consulting Physician (Oncology) Leonie Green, MD as Referring Physician (Surgery) [name], MD as Referring Physician (Radiation Oncology) Jacquelin Hawking, NP as Nurse Practitioner (Oncology) Clent Jacks, RN as Oncology Nurse Navigator   CHIEF COMPLAINT: Stage IV rectal cancer, history of follicular lymphoma.  INTERVAL HISTORY: Patient returns to clinic today for further evaluation and consideration of cycle 3 of FOLFOX.  Treatment was delayed several weeks secondary to Covid infection.  Patient is now asymptomatic and back to baseline. Her nausea and cold neuropathy have resolved.  She denies any pain.  She has no neurologic complaints. She denies any fevers, night sweats, or weight loss.  She denies any chest pain, shortness of breath, cough, or hemoptysis. She denies any nausea, vomiting, constipation, or diarrhea. She has no urinary complaints.  Patient offers no specific complaints today.  REVIEW OF SYSTEMS:   Review of Systems  Constitutional: Negative.  Negative for fever, malaise/fatigue and weight loss.  Respiratory: Negative.  Negative for cough, hemoptysis and shortness of breath.   Cardiovascular: Negative.  Negative for chest pain and leg swelling.  Gastrointestinal: Negative.  Negative for abdominal pain, blood in stool, melena, nausea and vomiting.  Genitourinary: Negative.  Negative for dysuria.  Musculoskeletal: Negative.  Negative for back pain, joint pain and neck pain.  Skin: Negative.  Negative for rash.  Neurological: Negative.  Negative for sensory change, focal weakness, weakness and headaches.  Psychiatric/Behavioral: Negative.  The patient is not nervous/anxious and does not have insomnia.     As per HPI. Otherwise, a complete review of systems is negative.   PAST MEDICAL HISTORY: Past Medical History:  Diagnosis Date  . Arthritis   . Cataract    bilateral  . Chicken pox   . Colon polyp   . Follicular lymphoma ([place]) 08/2016   lymph nodes   . Hyperlipidemia   . Osteoporosis     PAST SURGICAL HISTORY: Past Surgical History:  Procedure Laterality Date  . AXILLARY LYMPH NODE DISSECTION Right 08/21/2016   Procedure: AXILLARY LYMPH NODE excision;  Surgeon: Leonie Green, MD;  Location: [facility];  Service: General;  Laterality: Right;  . CATARACT EXTRACTION, BILATERAL Bilateral   . COLONOSCOPY N/A 09/19/2020   Procedure: COLONOSCOPY;  Surgeon: Lesly Rubenstein, MD;  Location: [facility];  Service: Endoscopy;  Laterality: N/A;  . EYE SURGERY    . JOINT REPLACEMENT Left   . PORTA CATH INSERTION N/A 09/16/2017   Procedure: PORTA CATH INSERTION;  Surgeon: Algernon Huxley, MD;  Location: [place] [facility];  Service: Cardiovascular;  Laterality: N/A;  . TONSILLECTOMY    . TOTAL HIP ARTHROPLASTY Left 1992    FAMILY HISTORY: Family History  Problem Relation Age of Onset  . Diabetes Sister   . Lung cancer Brother   . Diabetes Brother   . Basal cell carcinoma Daughter   . Breast cancer Paternal Aunt     ADVANCED DIRECTIVES (Y/N):  N  HEALTH MAINTENANCE: Social History   Tobacco Use  . Smoking status: Never Smoker  . Smokeless tobacco: Never Used  Vaping Use  . Vaping Use: Never used  Substance Use Topics  . Alcohol use: No  . Drug use: No     Colonoscopy:  PAP:  Bone density:  Lipid panel:  No Known Allergies  Current Outpatient Medications  Medication Sig Dispense Refill  . alendronate (FOSAMAX) 70 MG tablet TAKE 1 TABLET EVERY 7 DAYS TAKE WITH A FULL GLASS OF WATER. DO NOT LIE DOWN FOR THE NEXT 30 MIN.  3  . diclofenac sodium (VOLTAREN) 1 % GEL APPLY 2 GRAMS TOPICALLY 2 (TWO) TIMES DAILY    . lidocaine-prilocaine (EMLA) cream APPLY 1  APPLICATION TOPICALLY AS NEEDED. 30 g 1  . lidocaine-prilocaine (EMLA) cream Apply to affected area once 30 g 3  . Multiple Vitamins-Minerals (CENTRUM SILVER PO) Take 1 tablet by mouth daily.    . ondansetron (ZOFRAN) 8 MG tablet Take 1 tablet (8 mg total) by mouth 2 (two) times daily as needed for refractory nausea / vomiting. Start on day 3 after chemotherapy. 60 tablet 1  . pantoprazole (PROTONIX) 20 MG tablet Take 20 mg by mouth at bedtime.     . prochlorperazine (COMPAZINE) 10 MG tablet TAKE 1 TABLET (10 MG TOTAL) BY MOUTH EVERY 6 (SIX) HOURS AS NEEDED (NAUSEA OR VOMITING). 60 tablet 1  . simvastatin (ZOCOR) 20 MG tablet TAKE 1 TABLET BY MOUTH EVERY DAY AT NIGHT    . tizanidine (ZANAFLEX) 2 MG capsule Take 2 mg by mouth 3 (three) times daily.    . traMADol-acetaminophen (ULTRACET) 37.5-325 MG tablet Take 1 tablet by mouth every 8 (eight) hours as needed.     . ibandronate (BONIVA) 150 MG tablet PLEASE SEE ATTACHED FOR DETAILED DIRECTIONS (Patient not taking: Reported on 12/28/2020)     No current facility-administered medications for this visit.   Facility-Administered Medications Ordered in Other Visits  Medication Dose Route Frequency Provider Last Rate Last Admin  . fluorouracil (ADRUCIL) 3,500 mg in sodium chloride 0.9 % 80 mL chemo infusion  2,400 mg/m2 (Treatment Plan Recorded) Intravenous 1 day or 1 dose Lloyd Huger, MD   3,500 mg at 12/28/20 1218  . heparin lock flush 100 unit/mL  500 Units Intravenous Once Lloyd Huger, MD      . heparin lock flush 100 unit/mL  500 Units Intracatheter PRN Lloyd Huger, MD      . sodium chloride flush (NS) 0.9 % injection 10 mL  10 mL Intravenous PRN Lloyd Huger, MD   10 mL at 12/04/17 0901  . sodium chloride flush (NS) 0.9 % injection 10 mL  10 mL Intracatheter PRN Lloyd Huger, MD      . sodium chloride flush (NS) 0.9 % injection 10 mL  10 mL Intracatheter PRN Lloyd Huger, MD   10 mL at 12/28/20 0924  .  yttrium-90 injection 16.9 millicurie  67.8 millicurie Intravenous Once Chrystal, Eulas Post, MD        OBJECTIVE: Vitals:   12/28/20 0847  BP: 135/72  Pulse: 92  Resp: 18  Temp: (!) 97.5 F (36.4 C)  SpO2: 98%     Body mass index is 23.01 kg/m.    ECOG FS:0 - Asymptomatic  General: Well-developed, well-nourished, no acute distress. Eyes: Pink conjunctiva, anicteric sclera. HEENT: Normocephalic, moist mucous membranes. Lungs: No audible wheezing or coughing. Heart: Regular rate and rhythm. Abdomen: Soft, nontender, no obvious distention. Musculoskeletal: No edema, cyanosis, or clubbing. Neuro: Alert, answering all questions appropriately. Cranial nerves grossly intact. Skin: No rashes or petechiae noted. Psych: Normal affect.  LAB RESULTS:  Lab Results  Component Value Date   NA 141 12/28/2020   K 4.0 12/28/2020   CL 106 12/28/2020   CO2 25 12/28/2020   GLUCOSE  91 12/28/2020   BUN 15 12/28/2020   CREATININE 0.70 12/28/2020   CALCIUM 9.3 12/28/2020   PROT 7.1 12/28/2020   ALBUMIN 3.9 12/28/2020   AST 22 12/28/2020   ALT 17 12/28/2020   ALKPHOS 56 12/28/2020   BILITOT 0.7 12/28/2020   GFRNONAA >60 12/28/2020   GFRAA >60 06/28/2020    Lab Results  Component Value Date   WBC 6.9 12/28/2020   NEUTROABS 4.2 12/28/2020   HGB 12.6 12/28/2020   HCT 38.6 12/28/2020   MCV 98.5 12/28/2020   PLT 202 12/28/2020     STUDIES: US PELVIC COMPLETE WITH TRANSVAGINAL  Result Date: 11/28/2020 CLINICAL DATA:  Rectal cancer, low rectal cancer abutting and questionably invading posterior vaginal wall on MR, T4 node, postmenopausal EXAM: TRANSABDOMINAL AND TRANSVAGINAL ULTRASOUND OF PELVIS TECHNIQUE: Both transabdominal and transvaginal ultrasound examinations of the pelvis were performed. Transabdominal technique was performed for global imaging of the pelvis including uterus, ovaries, adnexal regions, and pelvic cul-de-sac. It was necessary to proceed with endovaginal exam following  the transabdominal exam to visualize the uterus, endometrium, and ovaries as well as vagina. COMPARISON:  PET-CT 11/17/2020 FINDINGS: Uterus Measurements: 3.3 x 2.8 x 3.5 cm = volume: 17 mL. Atrophic. Mildly heterogeneous. No focal mass. Endometrium Thickness: Not well-defined. Questionably thickened 9 mm thick. No endometrial fluid Right ovary Not visualized, question atrophic versus obscured by bowel Left ovary Not visualized, question atrophic versus obscured by bowel Other findings No free pelvic fluid. No adnexal masses. Due to bowel, unable to adequately evaluate adnexa. No gross cervical or vaginal abnormality identified. IMPRESSION: Nonvisualization of ovaries. Questionably thickened endometrial complex 9 mm thick; Endometrial thickness is considered abnormal for an asymptomatic post-menopausal female and endometrial sampling should be considered to exclude carcinoma. Electronically Signed   By: Lavonia Dana M.D.   On: 11/28/2020 16:24     ASSESSMENT: Stage IV rectal cancer, history of follicular lymphoma. PLAN:    1.  Stage IV rectal cancer: Biopsy confirmed rectal cancer.  [facility] completed [facility] on October 11, 2020 revealed extension of the tumor beyond the wall into the rectovaginal recess with suspected invasion of the vaginal wall posteriorly.  Tumor also appears to involve the internal anal sphincter.  There are also numerous prominent presacral and mesorectal lymph nodes highly suspicious for malignancy.  PET scan results from November 17, 2020 reviewed independently with 3 hypermetabolic right lower lobe lung nodules consistent with pulmonary metastasis.  Patient is unlikely a surgical candidate, but will plan on giving 6 cycles of neoadjuvant FOLFOX and then reconsider surgery and radiation at that time.  Proceed with cycle 2 of FOLFOX today.  Return to clinic in 2 days for pump removal and then in 2 weeks for further evaluation and consideration of cycle 3. 2. Recurrent follicular  lymphoma: CT scan results from December 23, 2019 reviewed independently with no obvious evidence of recurrent or progressive disease.  PET scan results as above consistent with rectal cancer metastasis and no evidence of lymphoma.   Patient received Zevalin on July 30, 2017 with not much therapeutic effect.  She subsequently underwent cycles 5 of R-CHOP chemotherapy with Neulasta support completing on December 12, 2017.  Patient continues to be in complete remission.  3.  Hypokalemia: Resolved. 4. Leukopenia: Resolved. 5. Thrombocytopenia: Resolved. 6.  Covid infection: Resolved.  Patient is now asymptomatic.  Patient expressed understanding and was in agreement with this plan. She also understands that She can call clinic at any time with any questions, concerns,  or complaints.   Cancer Staging 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 [facility]) Staging form: Lymphoid Neoplasms, AJCC 6th Edition - Clinical stage from 08/27/2016: Stage IIE (lymphoma only) - Signed by Lloyd Huger, MD on 08/27/2016  Rectal cancer [facility]) Staging form: Colon and Rectum, AJCC 8th Edition - Clinical: Stage IVA Larwance Sachs, Noelle.Marry) - Signed by Lloyd Huger, MD on 11/02/2020   Lloyd Huger, MD   12/28/2020 2:31 PM

## 2020-12-28 ENCOUNTER — Other Ambulatory Visit: Payer: Self-pay

## 2020-12-28 ENCOUNTER — Inpatient Hospital Stay: Payer: Medicare Other | Attending: Oncology | Admitting: Oncology

## 2020-12-28 ENCOUNTER — Inpatient Hospital Stay: Payer: Medicare Other

## 2020-12-28 ENCOUNTER — Encounter: Payer: Self-pay | Admitting: Oncology

## 2020-12-28 ENCOUNTER — Other Ambulatory Visit: Payer: Medicare Other

## 2020-12-28 VITALS — BP 135/72 | HR 92 | Temp 97.5°F | Resp 18 | Wt 110.1 lb

## 2020-12-28 DIAGNOSIS — Z5111 Encounter for antineoplastic chemotherapy: Secondary | ICD-10-CM | POA: Diagnosis present

## 2020-12-28 DIAGNOSIS — C7801 Secondary malignant neoplasm of right lung: Secondary | ICD-10-CM | POA: Diagnosis present

## 2020-12-28 DIAGNOSIS — C20 Malignant neoplasm of rectum: Secondary | ICD-10-CM

## 2020-12-28 LAB — CBC WITH DIFFERENTIAL/PLATELET
Abs Immature Granulocytes: 0.02 10*3/uL (ref 0.00–0.07)
Basophils Absolute: 0 10*3/uL (ref 0.0–0.1)
Basophils Relative: 0 %
Eosinophils Absolute: 0 10*3/uL (ref 0.0–0.5)
Eosinophils Relative: 0 %
HCT: 38.6 % (ref 36.0–46.0)
Hemoglobin: 12.6 g/dL (ref 12.0–15.0)
Immature Granulocytes: 0 %
Lymphocytes Relative: 24 %
Lymphs Abs: 1.7 10*3/uL (ref 0.7–4.0)
MCH: 32.1 pg (ref 26.0–34.0)
MCHC: 32.6 g/dL (ref 30.0–36.0)
MCV: 98.5 fL (ref 80.0–100.0)
Monocytes Absolute: 0.9 10*3/uL (ref 0.1–1.0)
Monocytes Relative: 13 %
Neutro Abs: 4.2 10*3/uL (ref 1.7–7.7)
Neutrophils Relative %: 63 %
Platelets: 202 10*3/uL (ref 150–400)
RBC: 3.92 MIL/uL (ref 3.87–5.11)
RDW: 14.6 % (ref 11.5–15.5)
WBC: 6.9 10*3/uL (ref 4.0–10.5)
nRBC: 0 % (ref 0.0–0.2)

## 2020-12-28 LAB — COMPREHENSIVE METABOLIC PANEL
ALT: 17 U/L (ref 0–44)
AST: 22 U/L (ref 15–41)
Albumin: 3.9 g/dL (ref 3.5–5.0)
Alkaline Phosphatase: 56 U/L (ref 38–126)
Anion gap: 10 (ref 5–15)
BUN: 15 mg/dL (ref 8–23)
CO2: 25 mmol/L (ref 22–32)
Calcium: 9.3 mg/dL (ref 8.9–10.3)
Chloride: 106 mmol/L (ref 98–111)
Creatinine, Ser: 0.7 mg/dL (ref 0.44–1.00)
GFR, Estimated: >60 mL/min (ref >60)
Glucose, Bld: 91 mg/dL (ref 70–99)
Potassium: 4 mmol/L (ref 3.5–5.1)
Sodium: 141 mmol/L (ref 135–145)
Total Bilirubin: 0.7 mg/dL (ref 0.3–1.2)
Total Protein: 7.1 g/dL (ref 6.5–8.1)

## 2020-12-28 MED ORDER — OXALIPLATIN CHEMO INJECTION 100 MG/20ML
85.0000 mg/m2 | Freq: Once | INTRAVENOUS | Status: AC
  Administered 2020-12-28: 125 mg via INTRAVENOUS
  Filled 2020-12-28: qty 20

## 2020-12-28 MED ORDER — SODIUM CHLORIDE 0.9 % IV SOLN
2400.0000 mg/m2 | INTRAVENOUS | Status: DC
  Administered 2020-12-28: 3500 mg via INTRAVENOUS
  Filled 2020-12-28: qty 70

## 2020-12-28 MED ORDER — SODIUM CHLORIDE 0.9% FLUSH
10.0000 mL | INTRAVENOUS | Status: DC | PRN
Start: 2020-12-28 — End: 2020-12-28
  Administered 2020-12-28: 10 mL
  Filled 2020-12-28: qty 10

## 2020-12-28 MED ORDER — LEUCOVORIN CALCIUM INJECTION 350 MG: 400.0000 mg/m2 | Freq: Once | INTRAVENOUS | Status: DC

## 2020-12-28 MED ORDER — LEUCOVORIN CALCIUM INJECTION 350 MG
600.0000 mg | Freq: Once | INTRAVENOUS | Status: AC
  Administered 2020-12-28: 600 mg via INTRAVENOUS
  Filled 2020-12-28: qty 25

## 2020-12-28 MED ORDER — PALONOSETRON HCL INJECTION 0.25 MG/5ML
0.2500 mg | Freq: Once | INTRAVENOUS | Status: AC
  Administered 2020-12-28: 0.25 mg via INTRAVENOUS
  Filled 2020-12-28: qty 5

## 2020-12-28 MED ORDER — DEXTROSE 5 % IV SOLN
Freq: Once | INTRAVENOUS | Status: AC
  Filled 2020-12-28: qty 250

## 2020-12-28 MED ORDER — FLUOROURACIL CHEMO INJECTION 2.5 GM/50ML
400.0000 mg/m2 | Freq: Once | INTRAVENOUS | Status: AC
  Administered 2020-12-28: 600 mg via INTRAVENOUS
  Filled 2020-12-28: qty 12

## 2020-12-28 MED ORDER — SODIUM CHLORIDE 0.9 % IV SOLN
10.0000 mg | Freq: Once | INTRAVENOUS | Status: AC
  Administered 2020-12-28: 10 mg via INTRAVENOUS
  Filled 2020-12-28: qty 10

## 2020-12-30 ENCOUNTER — Inpatient Hospital Stay: Payer: Medicare Other

## 2020-12-30 VITALS — BP 131/82 | HR 95 | Temp 98.0°F

## 2020-12-30 DIAGNOSIS — Z5111 Encounter for antineoplastic chemotherapy: Secondary | ICD-10-CM | POA: Diagnosis not present

## 2020-12-30 DIAGNOSIS — C20 Malignant neoplasm of rectum: Secondary | ICD-10-CM

## 2020-12-30 MED ORDER — HEPARIN SOD (PORK) LOCK FLUSH 100 UNIT/ML IV SOLN
INTRAVENOUS | Status: AC
  Filled 2020-12-30: qty 5

## 2020-12-30 MED ORDER — SODIUM CHLORIDE 0.9% FLUSH
10.0000 mL | INTRAVENOUS | Status: DC | PRN
  Administered 2020-12-30: 10 mL
  Filled 2020-12-30: qty 10

## 2020-12-30 MED ORDER — HEPARIN SOD (PORK) LOCK FLUSH 100 UNIT/ML IV SOLN
500.0000 [IU] | Freq: Once | INTRAVENOUS | Status: AC | PRN
  Administered 2020-12-30: 500 [IU]
  Filled 2020-12-30: qty 5

## 2021-01-03 ENCOUNTER — Ambulatory Visit: Payer: Medicare Other | Admitting: Oncology

## 2021-01-04 ENCOUNTER — Other Ambulatory Visit: Payer: Medicare Other

## 2021-01-04 ENCOUNTER — Ambulatory Visit: Payer: Medicare Other

## 2021-01-04 ENCOUNTER — Ambulatory Visit: Payer: Medicare Other | Admitting: Oncology

## 2021-01-07 NOTE — Progress Notes (Addendum)
[place]  Telephone:(336) [phone] [phone]) [phone]  ID: Veronica Boyd OB: 1945/11/17  MR#: [id number]  FWY#:[id number]  Patient Care Team: Tracie Harrier, MD as PCP - General (Internal Medicine) Lloyd Huger, MD as Consulting Physician (Oncology) Leonie Green, MD as Referring Physician (Surgery) [name], MD as Referring Physician (Radiation Oncology) Jacquelin Hawking, NP as Nurse Practitioner (Oncology) Clent Jacks, RN as Oncology Nurse Navigator   CHIEF COMPLAINT: Stage IV rectal cancer, history of follicular lymphoma.  INTERVAL HISTORY: Patient returns to clinic today for further evaluation and consideration of cycle 4 of 12 of FOLFOX.  She currently feels well and is asymptomatic.  She has no neurologic complaints. She denies any fevers, night sweats, or weight loss.  She denies any chest pain, shortness of breath, cough, or hemoptysis. She denies any nausea, vomiting, constipation, or diarrhea. She has no urinary complaints.  Patient feels at her baseline offers no specific complaints today.  REVIEW OF SYSTEMS:   Review of Systems  Constitutional: Negative.  Negative for fever, malaise/fatigue and weight loss.  Respiratory: Negative.  Negative for cough, hemoptysis and shortness of breath.   Cardiovascular: Negative.  Negative for chest pain and leg swelling.  Gastrointestinal: Negative.  Negative for abdominal pain, blood in stool, melena, nausea and vomiting.  Genitourinary: Negative.  Negative for dysuria.  Musculoskeletal: Negative.  Negative for back pain, joint pain and neck pain.  Skin: Negative.  Negative for rash.  Neurological: Negative.  Negative for sensory change, focal weakness, weakness and headaches.  Psychiatric/Behavioral: Negative.  The patient is not nervous/anxious and does not have insomnia.    As per HPI. Otherwise, a complete review of systems is negative.   PAST MEDICAL HISTORY: Past Medical  History:  Diagnosis Date  . Arthritis   . Cataract    bilateral  . Chicken pox   . Colon polyp   . Follicular lymphoma ([place]) 08/2016   lymph nodes   . Hyperlipidemia   . Osteoporosis     PAST SURGICAL HISTORY: Past Surgical History:  Procedure Laterality Date  . AXILLARY LYMPH NODE DISSECTION Right 08/21/2016   Procedure: AXILLARY LYMPH NODE excision;  Surgeon: Leonie Green, MD;  Location: [facility];  Service: General;  Laterality: Right;  . CATARACT EXTRACTION, BILATERAL Bilateral   . COLONOSCOPY N/A 09/19/2020   Procedure: COLONOSCOPY;  Surgeon: Lesly Rubenstein, MD;  Location: [facility];  Service: Endoscopy;  Laterality: N/A;  . EYE SURGERY    . JOINT REPLACEMENT Left   . PORTA CATH INSERTION N/A 09/16/2017   Procedure: PORTA CATH INSERTION;  Surgeon: Algernon Huxley, MD;  Location: [place] [facility];  Service: Cardiovascular;  Laterality: N/A;  . TONSILLECTOMY    . TOTAL HIP ARTHROPLASTY Left 1992    FAMILY HISTORY: Family History  Problem Relation Age of Onset  . Diabetes Sister   . Lung cancer Brother   . Diabetes Brother   . Basal cell carcinoma Daughter   . Breast cancer Paternal Aunt     ADVANCED DIRECTIVES (Y/N):  N  HEALTH MAINTENANCE: Social History   Tobacco Use  . Smoking status: Never Smoker  . Smokeless tobacco: Never Used  Vaping Use  . Vaping Use: Never used  Substance Use Topics  . Alcohol use: No  . Drug use: No     Colonoscopy:  PAP:  Bone density:  Lipid panel:  No Known Allergies  Current Outpatient Medications  Medication Sig Dispense Refill  . alendronate (FOSAMAX)  70 MG tablet TAKE 1 TABLET EVERY 7 DAYS TAKE WITH A FULL GLASS OF WATER. DO NOT LIE DOWN FOR THE NEXT 30 MIN.  3  . diclofenac sodium (VOLTAREN) 1 % GEL APPLY 2 GRAMS TOPICALLY 2 (TWO) TIMES DAILY    . lidocaine-prilocaine (EMLA) cream APPLY 1 APPLICATION TOPICALLY AS NEEDED. 30 g 1  . lidocaine-prilocaine (EMLA) cream Apply to affected area once 30 g  3  . Multiple Vitamins-Minerals (CENTRUM SILVER PO) Take 1 tablet by mouth daily.    . ondansetron (ZOFRAN) 8 MG tablet Take 1 tablet (8 mg total) by mouth 2 (two) times daily as needed for refractory nausea / vomiting. Start on day 3 after chemotherapy. 60 tablet 1  . pantoprazole (PROTONIX) 20 MG tablet Take 20 mg by mouth at bedtime.     . prochlorperazine (COMPAZINE) 10 MG tablet TAKE 1 TABLET (10 MG TOTAL) BY MOUTH EVERY 6 (SIX) HOURS AS NEEDED (NAUSEA OR VOMITING). 60 tablet 1  . simvastatin (ZOCOR) 20 MG tablet TAKE 1 TABLET BY MOUTH EVERY DAY AT NIGHT    . tizanidine (ZANAFLEX) 2 MG capsule Take 2 mg by mouth 3 (three) times daily.    . traMADol-acetaminophen (ULTRACET) 37.5-325 MG tablet Take 1 tablet by mouth every 8 (eight) hours as needed.     . ibandronate (BONIVA) 150 MG tablet PLEASE SEE ATTACHED FOR DETAILED DIRECTIONS (Patient not taking: No sig reported)     No current facility-administered medications for this visit.   Facility-Administered Medications Ordered in Other Visits  Medication Dose Route Frequency Provider Last Rate Last Admin  . heparin lock flush 100 unit/mL  500 Units Intravenous Once Lloyd Huger, MD      . heparin lock flush 100 unit/mL  500 Units Intracatheter PRN Lloyd Huger, MD      . sodium chloride flush (NS) 0.9 % injection 10 mL  10 mL Intravenous PRN Lloyd Huger, MD   10 mL at 12/04/17 0901  . sodium chloride flush (NS) 0.9 % injection 10 mL  10 mL Intracatheter PRN Lloyd Huger, MD      . sodium chloride flush (NS) 0.9 % injection 10 mL  10 mL Intravenous PRN Lloyd Huger, MD   10 mL at 01/11/21 0815  . yttrium-90 injection 81.4 millicurie  48.1 millicurie Intravenous Once Chrystal, Eulas Post, MD        OBJECTIVE: Vitals:   01/11/21 0840  BP: 119/74  Pulse: 91  Resp: 18  Temp: 98.2 F (36.8 C)  SpO2: 98%     Body mass index is 23.12 kg/m.    ECOG FS:0 - Asymptomatic  General: Thin, no acute distress. Eyes:  Pink conjunctiva, anicteric sclera. HEENT: Normocephalic, moist mucous membranes. Lungs: No audible wheezing or coughing. Heart: Regular rate and rhythm. Abdomen: Soft, nontender, no obvious distention. Musculoskeletal: No edema, cyanosis, or clubbing. Neuro: Alert, answering all questions appropriately. Cranial nerves grossly intact. Skin: No rashes or petechiae noted. Psych: Normal affect.  LAB RESULTS:  Lab Results  Component Value Date   NA 140 01/11/2021   K 3.4 (L) 01/11/2021   CL 105 01/11/2021   CO2 25 01/11/2021   GLUCOSE 113 (H) 01/11/2021   BUN 10 01/11/2021   CREATININE 0.68 01/11/2021   CALCIUM 9.0 01/11/2021   PROT 6.9 01/11/2021   ALBUMIN 3.8 01/11/2021   AST 28 01/11/2021   ALT 16 01/11/2021   ALKPHOS 51 01/11/2021   BILITOT 0.6 01/11/2021   GFRNONAA >60 01/11/2021  GFRAA >60 06/28/2020    Lab Results  Component Value Date   WBC 2.9 (L) 01/11/2021   NEUTROABS 0.9 (L) 01/11/2021   HGB 12.1 01/11/2021   HCT 36.4 01/11/2021   MCV 98.4 01/11/2021   PLT 111 (L) 01/11/2021     STUDIES: No results found.   ASSESSMENT: Stage IV rectal cancer, history of follicular lymphoma. PLAN:    1.  Stage IV rectal cancer: Biopsy confirmed rectal cancer.  [facility] completed at [facility] on October 11, 2020 revealed extension of the tumor beyond the wall into the rectovaginal recess with suspected invasion of the vaginal wall posteriorly.  Tumor also appears to involve the internal anal sphincter.  There are also numerous prominent presacral and mesorectal lymph nodes highly suspicious for malignancy.  PET scan results from November 17, 2020 reviewed independently with 3 hypermetabolic right lower lobe lung nodules consistent with pulmonary metastasis.  Patient is unlikely a surgical candidate, but will plan on giving 6 cycles of neoadjuvant FOLFOX and then reconsider surgery and radiation at that time.  Proceed with cycle 4 of FOLFOX today.  Return to clinic in 2 days  for pump removal and then in 2 weeks for further evaluation and consideration of cycle 5.  2. Recurrent follicular lymphoma: CT scan results from December 23, 2019 reviewed independently with no obvious evidence of recurrent or progressive disease.  PET scan results as above consistent with rectal cancer metastasis and no evidence of lymphoma.   Patient received Zevalin on July 30, 2017 with not much therapeutic effect.  She subsequently underwent cycles 5 of R-CHOP chemotherapy with Neulasta support completing on December 12, 2017.  Patient continues to be in complete remission.  3.  Hypokalemia: Mild.  Patient was instructed to reinitiate oral iron supplementation and was also given dietary recommendations. 4.  Neutropenia: Proceed with treatment as above.  Will add Ziextenzo with her pump removal in 2 days and then for the remaining of cycles. 5. Thrombocytopenia: Mild.  Proceed with treatment as above.   6.  Covid infection: Resolved.  Patient is now asymptomatic.  Patient expressed understanding and was in agreement with this plan. She also understands that She can call clinic at any time with any questions, concerns, or complaints.   Cancer Staging 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 [facility]) Staging form: Lymphoid Neoplasms, AJCC 6th Edition - Clinical stage from 08/27/2016: Stage IIE (lymphoma only) - Signed by Lloyd Huger, MD on 08/27/2016  Rectal cancer [facility]) Staging form: Colon and Rectum, AJCC 8th Edition - Clinical: Stage IVA Larwance Sachs, Noelle.Marry) - Signed by Lloyd Huger, MD on 11/02/2020   Lloyd Huger, MD   01/11/2021 9:10 AM

## 2021-01-11 ENCOUNTER — Inpatient Hospital Stay (HOSPITAL_BASED_OUTPATIENT_CLINIC_OR_DEPARTMENT_OTHER): Payer: Medicare Other | Admitting: Oncology

## 2021-01-11 ENCOUNTER — Inpatient Hospital Stay: Payer: Medicare Other

## 2021-01-11 ENCOUNTER — Inpatient Hospital Stay: Payer: Medicare Other | Attending: Oncology

## 2021-01-11 ENCOUNTER — Encounter: Payer: Self-pay | Admitting: Oncology

## 2021-01-11 VITALS — BP 119/74 | HR 91 | Temp 98.2°F | Resp 18 | Wt 110.6 lb

## 2021-01-11 DIAGNOSIS — C20 Malignant neoplasm of rectum: Secondary | ICD-10-CM

## 2021-01-11 DIAGNOSIS — D709 Neutropenia, unspecified: Secondary | ICD-10-CM | POA: Diagnosis not present

## 2021-01-11 DIAGNOSIS — Z5189 Encounter for other specified aftercare: Secondary | ICD-10-CM | POA: Insufficient documentation

## 2021-01-11 DIAGNOSIS — C7801 Secondary malignant neoplasm of right lung: Secondary | ICD-10-CM | POA: Insufficient documentation

## 2021-01-11 DIAGNOSIS — Z5111 Encounter for antineoplastic chemotherapy: Secondary | ICD-10-CM | POA: Diagnosis not present

## 2021-01-11 DIAGNOSIS — E876 Hypokalemia: Secondary | ICD-10-CM | POA: Insufficient documentation

## 2021-01-11 LAB — CBC WITH DIFFERENTIAL/PLATELET
Abs Immature Granulocytes: 0.01 10*3/uL (ref 0.00–0.07)
Basophils Absolute: 0 10*3/uL (ref 0.0–0.1)
Basophils Relative: 1 %
Eosinophils Absolute: 0.1 10*3/uL (ref 0.0–0.5)
Eosinophils Relative: 3 %
HCT: 36.4 % (ref 36.0–46.0)
Hemoglobin: 12.1 g/dL (ref 12.0–15.0)
Immature Granulocytes: 0 %
Lymphocytes Relative: 42 %
Lymphs Abs: 1.2 10*3/uL (ref 0.7–4.0)
MCH: 32.7 pg (ref 26.0–34.0)
MCHC: 33.2 g/dL (ref 30.0–36.0)
MCV: 98.4 fL (ref 80.0–100.0)
Monocytes Absolute: 0.6 10*3/uL (ref 0.1–1.0)
Monocytes Relative: 22 %
Neutro Abs: 0.9 10*3/uL — ABNORMAL LOW (ref 1.7–7.7)
Neutrophils Relative %: 32 %
Platelets: 111 10*3/uL — ABNORMAL LOW (ref 150–400)
RBC: 3.7 MIL/uL — ABNORMAL LOW (ref 3.87–5.11)
RDW: 14.6 % (ref 11.5–15.5)
WBC: 2.9 10*3/uL — ABNORMAL LOW (ref 4.0–10.5)
nRBC: 0 % (ref 0.0–0.2)

## 2021-01-11 LAB — COMPREHENSIVE METABOLIC PANEL
ALT: 16 U/L (ref 0–44)
AST: 28 U/L (ref 15–41)
Albumin: 3.8 g/dL (ref 3.5–5.0)
Alkaline Phosphatase: 51 U/L (ref 38–126)
Anion gap: 10 (ref 5–15)
BUN: 10 mg/dL (ref 8–23)
CO2: 25 mmol/L (ref 22–32)
Calcium: 9 mg/dL (ref 8.9–10.3)
Chloride: 105 mmol/L (ref 98–111)
Creatinine, Ser: 0.68 mg/dL (ref 0.44–1.00)
GFR, Estimated: >60 mL/min (ref >60)
Glucose, Bld: 113 mg/dL — ABNORMAL HIGH (ref 70–99)
Potassium: 3.4 mmol/L — ABNORMAL LOW (ref 3.5–5.1)
Sodium: 140 mmol/L (ref 135–145)
Total Bilirubin: 0.6 mg/dL (ref 0.3–1.2)
Total Protein: 6.9 g/dL (ref 6.5–8.1)

## 2021-01-11 MED ORDER — FLUOROURACIL CHEMO INJECTION 2.5 GM/50ML
400.0000 mg/m2 | Freq: Once | INTRAVENOUS | Status: AC
  Administered 2021-01-11: 600 mg via INTRAVENOUS
  Filled 2021-01-11: qty 12

## 2021-01-11 MED ORDER — OXALIPLATIN CHEMO INJECTION 100 MG/20ML
85.0000 mg/m2 | Freq: Once | INTRAVENOUS | Status: AC
  Administered 2021-01-11: 125 mg via INTRAVENOUS
  Filled 2021-01-11: qty 20

## 2021-01-11 MED ORDER — SODIUM CHLORIDE 0.9 % IV SOLN
2400.0000 mg/m2 | INTRAVENOUS | Status: DC
  Administered 2021-01-11: 3500 mg via INTRAVENOUS
  Filled 2021-01-11: qty 70

## 2021-01-11 MED ORDER — SODIUM CHLORIDE 0.9% FLUSH
10.0000 mL | INTRAVENOUS | Status: DC | PRN
  Administered 2021-01-11: 10 mL via INTRAVENOUS
  Filled 2021-01-11: qty 10

## 2021-01-11 MED ORDER — PALONOSETRON HCL INJECTION 0.25 MG/5ML
0.2500 mg | Freq: Once | INTRAVENOUS | Status: AC
  Administered 2021-01-11: 0.25 mg via INTRAVENOUS
  Filled 2021-01-11: qty 5

## 2021-01-11 MED ORDER — LEUCOVORIN CALCIUM INJECTION 350 MG
600.0000 mg | Freq: Once | INTRAVENOUS | Status: AC
  Administered 2021-01-11: 600 mg via INTRAVENOUS
  Filled 2021-01-11: qty 25

## 2021-01-11 MED ORDER — DEXTROSE 5 % IV SOLN
Freq: Once | INTRAVENOUS | Status: AC
Start: 2021-01-11 — End: 2021-01-11
  Filled 2021-01-11: qty 250

## 2021-01-11 MED ORDER — SODIUM CHLORIDE 0.9 % IV SOLN
INTRAVENOUS | Status: DC
  Filled 2021-01-11: qty 250

## 2021-01-11 MED ORDER — SODIUM CHLORIDE 0.9 % IV SOLN
10.0000 mg | Freq: Once | INTRAVENOUS | Status: AC
  Administered 2021-01-11: 10 mg via INTRAVENOUS
  Filled 2021-01-11: qty 10

## 2021-01-11 NOTE — Addendum Note (Signed)
Addended by: Lloyd Huger on: 01/11/2021 09:21 AM   Modules accepted: Orders

## 2021-01-11 NOTE — Progress Notes (Signed)
Patient here for oncology follow-up appointment, expresses no complaints or concerns at this time.    

## 2021-01-11 NOTE — Progress Notes (Signed)
ANC 0.9, per MD ok to treat.

## 2021-01-13 ENCOUNTER — Inpatient Hospital Stay: Payer: Medicare Other

## 2021-01-13 DIAGNOSIS — C20 Malignant neoplasm of rectum: Secondary | ICD-10-CM

## 2021-01-13 DIAGNOSIS — Z5111 Encounter for antineoplastic chemotherapy: Secondary | ICD-10-CM | POA: Diagnosis not present

## 2021-01-13 MED ORDER — SODIUM CHLORIDE 0.9% FLUSH
10.0000 mL | INTRAVENOUS | Status: DC | PRN
  Administered 2021-01-13: 10 mL
  Filled 2021-01-13: qty 10

## 2021-01-13 MED ORDER — PEGFILGRASTIM-BMEZ 6 MG/0.6ML ~~LOC~~ SOSY
6.0000 mg | PREFILLED_SYRINGE | Freq: Once | SUBCUTANEOUS | Status: AC
  Administered 2021-01-13: 6 mg via SUBCUTANEOUS
  Filled 2021-01-13: qty 0.6

## 2021-01-13 MED ORDER — HEPARIN SOD (PORK) LOCK FLUSH 100 UNIT/ML IV SOLN
500.0000 [IU] | Freq: Once | INTRAVENOUS | Status: AC | PRN
  Administered 2021-01-13: 500 [IU]
  Filled 2021-01-13: qty 5

## 2021-01-20 NOTE — Progress Notes (Signed)
[place]  [phone]) [phone] [phone]) [phone]  ID: [name] OB: Jun 15, 1946  MR#: [id number]  UYQ#:[id number]  Patient Care Team: Tracie Harrier, MD as PCP - General (Internal Medicine) Lloyd Huger, MD as Consulting Physician (Oncology) Leonie Green, MD as Referring Physician (Surgery) [name], MD as Referring Physician (Radiation Oncology) Jacquelin Hawking, NP as Nurse Practitioner (Oncology) Clent Jacks, RN as Oncology Nurse Navigator   CHIEF COMPLAINT: Stage IV rectal cancer, history of follicular lymphoma.  INTERVAL HISTORY: Patient returns to clinic today for further evaluation and consideration of cycle 5 of 12 of FOLFOX.  She continues to feel well and remains asymptomatic.  She is tolerating her treatments without significant side effects.  She has no neurologic complaints. She denies any fevers, night sweats, or weight loss.  She denies any chest pain, shortness of breath, cough, or hemoptysis. She denies any nausea, vomiting, constipation, or diarrhea. She has no urinary complaints.  Patient offers no specific complaints today.  REVIEW OF SYSTEMS:   Review of Systems  Constitutional: Negative.  Negative for fever, malaise/fatigue and weight loss.  Respiratory: Negative.  Negative for cough, hemoptysis and shortness of breath.   Cardiovascular: Negative.  Negative for chest pain and leg swelling.  Gastrointestinal: Negative.  Negative for abdominal pain, blood in stool, melena, nausea and vomiting.  Genitourinary: Negative.  Negative for dysuria.  Musculoskeletal: Negative.  Negative for back pain, joint pain and neck pain.  Skin: Negative.  Negative for rash.  Neurological: Negative.  Negative for sensory change, focal weakness, weakness and headaches.  Psychiatric/Behavioral: Negative.  The patient is not nervous/anxious and does not have insomnia.    As per HPI. Otherwise, a complete review of systems is  negative.   PAST MEDICAL HISTORY: Past Medical History:  Diagnosis Date  . Arthritis   . Cataract    bilateral  . Chicken pox   . Colon polyp   . Follicular lymphoma ([place]) 08/2016   lymph nodes   . Hyperlipidemia   . Osteoporosis     PAST SURGICAL HISTORY: Past Surgical History:  Procedure Laterality Date  . AXILLARY LYMPH NODE DISSECTION Right 08/21/2016   Procedure: AXILLARY LYMPH NODE excision;  Surgeon: Leonie Green, MD;  Location: [facility];  Service: General;  Laterality: Right;  . CATARACT EXTRACTION, BILATERAL Bilateral   . COLONOSCOPY N/A 09/19/2020   Procedure: COLONOSCOPY;  Surgeon: Lesly Rubenstein, MD;  Location: [facility];  Service: Endoscopy;  Laterality: N/A;  . EYE SURGERY    . JOINT REPLACEMENT Left   . PORTA CATH INSERTION N/A 09/16/2017   Procedure: PORTA CATH INSERTION;  Surgeon: Algernon Huxley, MD;  Location: [place] [facility];  Service: Cardiovascular;  Laterality: N/A;  . TONSILLECTOMY    . TOTAL HIP ARTHROPLASTY Left 1992    FAMILY HISTORY: Family History  Problem Relation Age of Onset  . Diabetes Sister   . Lung cancer Brother   . Diabetes Brother   . Basal cell carcinoma Daughter   . Breast cancer Paternal Aunt     ADVANCED DIRECTIVES (Y/N):  N  HEALTH MAINTENANCE: Social History   Tobacco Use  . Smoking status: Never Smoker  . Smokeless tobacco: Never Used  Vaping Use  . Vaping Use: Never used  Substance Use Topics  . Alcohol use: No  . Drug use: No     Colonoscopy:  PAP:  Bone density:  Lipid panel:  No Known Allergies  Current Outpatient Medications  Medication  Sig Dispense Refill  . alendronate (FOSAMAX) 70 MG tablet TAKE 1 TABLET EVERY 7 DAYS TAKE WITH A FULL GLASS OF WATER. DO NOT LIE DOWN FOR THE NEXT 30 MIN.  3  . diclofenac sodium (VOLTAREN) 1 % GEL APPLY 2 GRAMS TOPICALLY 2 (TWO) TIMES DAILY    . lidocaine-prilocaine (EMLA) cream APPLY 1 APPLICATION TOPICALLY AS NEEDED. 30 g 1  .  lidocaine-prilocaine (EMLA) cream Apply to affected area once 30 g 3  . Multiple Vitamins-Minerals (CENTRUM SILVER PO) Take 1 tablet by mouth daily.    . ondansetron (ZOFRAN) 8 MG tablet Take 1 tablet (8 mg total) by mouth 2 (two) times daily as needed for refractory nausea / vomiting. Start on day 3 after chemotherapy. 60 tablet 1  . pantoprazole (PROTONIX) 20 MG tablet Take 20 mg by mouth at bedtime.     . prochlorperazine (COMPAZINE) 10 MG tablet TAKE 1 TABLET (10 MG TOTAL) BY MOUTH EVERY 6 (SIX) HOURS AS NEEDED (NAUSEA OR VOMITING). 60 tablet 1  . simvastatin (ZOCOR) 20 MG tablet TAKE 1 TABLET BY MOUTH EVERY DAY AT NIGHT    . tizanidine (ZANAFLEX) 2 MG capsule Take 2 mg by mouth 3 (three) times daily.    . traMADol-acetaminophen (ULTRACET) 37.5-325 MG tablet Take 1 tablet by mouth every 8 (eight) hours as needed.     . ibandronate (BONIVA) 150 MG tablet PLEASE SEE ATTACHED FOR DETAILED DIRECTIONS (Patient not taking: No sig reported)     No current facility-administered medications for this visit.   Facility-Administered Medications Ordered in Other Visits  Medication Dose Route Frequency Provider Last Rate Last Admin  . 0.9 % NaCl with KCl 40 mEq / L  infusion   Intravenous Once Lloyd Huger, MD      . heparin lock flush 100 unit/mL  500 Units Intravenous Once Lloyd Huger, MD      . heparin lock flush 100 unit/mL  500 Units Intracatheter PRN Lloyd Huger, MD      . heparin lock flush 100 unit/mL  500 Units Intravenous Once Lloyd Huger, MD      . sodium chloride flush (NS) 0.9 % injection 10 mL  10 mL Intravenous PRN Lloyd Huger, MD   10 mL at 12/04/17 0901  . sodium chloride flush (NS) 0.9 % injection 10 mL  10 mL Intracatheter PRN Lloyd Huger, MD      . yttrium-90 injection 82.5 millicurie  05.3 millicurie Intravenous Once [name], MD        OBJECTIVE: Vitals:   01/25/21 0848  BP: 140/74  Pulse: 90  Resp: 18  Temp: (!) 96.7 F  (35.9 C)  SpO2: 97%     Body mass index is 22.72 kg/m.    ECOG FS:0 - Asymptomatic  General: Thin, no acute distress. Eyes: Pink conjunctiva, anicteric sclera. HEENT: Normocephalic, moist mucous membranes. Lungs: No audible wheezing or coughing. Heart: Regular rate and rhythm. Abdomen: Soft, nontender, no obvious distention. Musculoskeletal: No edema, cyanosis, or clubbing. Neuro: Alert, answering all questions appropriately. Cranial nerves grossly intact. Skin: No rashes or petechiae noted. Psych: Normal affect.  LAB RESULTS:  Lab Results  Component Value Date   NA 139 01/25/2021   K 3.1 (L) 01/25/2021   CL 103 01/25/2021   CO2 28 01/25/2021   GLUCOSE 104 (H) 01/25/2021   BUN 10 01/25/2021   CREATININE 0.73 01/25/2021   CALCIUM 9.0 01/25/2021   PROT 6.7 01/25/2021   ALBUMIN 4.0 01/25/2021  AST 28 01/25/2021   ALT 16 01/25/2021   ALKPHOS 78 01/25/2021   BILITOT 0.6 01/25/2021   GFRNONAA >60 01/25/2021   GFRAA >60 06/28/2020    Lab Results  Component Value Date   WBC 6.2 01/25/2021   NEUTROABS 3.2 01/25/2021   HGB 12.5 01/25/2021   HCT 38.4 01/25/2021   MCV 99.7 01/25/2021   PLT 72 (L) 01/25/2021     STUDIES: No results found.   ASSESSMENT: Stage IV rectal cancer, history of follicular lymphoma. PLAN:    1.  Stage IV rectal cancer: Biopsy confirmed rectal cancer.  MRI completed at [facility] on October 11, 2020 revealed extension of the tumor beyond the wall into the rectovaginal recess with suspected invasion of the vaginal wall posteriorly.  Tumor also appears to involve the internal anal sphincter.  There are also numerous prominent presacral and mesorectal lymph nodes highly suspicious for malignancy.  PET scan results from November 17, 2020 reviewed independently with 3 hypermetabolic right lower lobe lung nodules consistent with pulmonary metastasis.  Patient is unlikely a surgical candidate, but will plan on giving 6 cycles of neoadjuvant FOLFOX and  then reconsider surgery and radiation at that time.  Delay cycle 5 of FOLFOX today secondary to thrombocytopenia.  Return to clinic in 1 week for further evaluation and consideration of cycle 6.    2. Recurrent follicular lymphoma: CT scan results from December 23, 2019 reviewed independently with no obvious evidence of recurrent or progressive disease.  PET scan results as above consistent with rectal cancer metastasis and no evidence of lymphoma.   Patient received Zevalin on July 30, 2017 with not much therapeutic effect.  She subsequently underwent cycles 5 of R-CHOP chemotherapy with Neulasta support completing on December 12, 2017.  Patient continues to be in complete remission.  3.  Hypokalemia: Patient will receive 40 mEq of IV potassium today.  Continue oral iron supplementation. 4.  Neutropenia: Resolved, but patient will require Ziextenzo with her pump removal for the remaining of cycles. 5. Thrombocytopenia: Delay treatment as above. 6.  Covid infection: Resolved.  Patient is now asymptomatic.  Patient expressed understanding and was in agreement with this plan. She also understands that She can call clinic at any time with any questions, concerns, or complaints.   Cancer Staging 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 [facility]) Staging form: Lymphoid Neoplasms, AJCC 6th Edition - Clinical stage from 08/27/2016: Stage IIE (lymphoma only) - Signed by Lloyd Huger, MD on 08/27/2016  Rectal cancer [facility]) Staging form: Colon and Rectum, AJCC 8th Edition - Clinical: Stage IVA Larwance Sachs, Noelle.Marry) - Signed by Lloyd Huger, MD on 11/02/2020   Lloyd Huger, MD   01/25/2021 9:39 AM

## 2021-01-25 ENCOUNTER — Inpatient Hospital Stay (HOSPITAL_BASED_OUTPATIENT_CLINIC_OR_DEPARTMENT_OTHER): Payer: Medicare Other | Admitting: Oncology

## 2021-01-25 ENCOUNTER — Inpatient Hospital Stay: Payer: Medicare Other

## 2021-01-25 ENCOUNTER — Encounter: Payer: Self-pay | Admitting: Oncology

## 2021-01-25 VITALS — BP 140/74 | HR 90 | Temp 96.7°F | Resp 18 | Wt 108.7 lb

## 2021-01-25 DIAGNOSIS — E876 Hypokalemia: Secondary | ICD-10-CM

## 2021-01-25 DIAGNOSIS — Z95828 Presence of other vascular implants and grafts: Secondary | ICD-10-CM

## 2021-01-25 DIAGNOSIS — C20 Malignant neoplasm of rectum: Secondary | ICD-10-CM | POA: Diagnosis not present

## 2021-01-25 DIAGNOSIS — Z5111 Encounter for antineoplastic chemotherapy: Secondary | ICD-10-CM | POA: Diagnosis not present

## 2021-01-25 LAB — COMPREHENSIVE METABOLIC PANEL
ALT: 16 U/L (ref 0–44)
AST: 28 U/L (ref 15–41)
Albumin: 4 g/dL (ref 3.5–5.0)
Alkaline Phosphatase: 78 U/L (ref 38–126)
Anion gap: 8 (ref 5–15)
BUN: 10 mg/dL (ref 8–23)
CO2: 28 mmol/L (ref 22–32)
Calcium: 9 mg/dL (ref 8.9–10.3)
Chloride: 103 mmol/L (ref 98–111)
Creatinine, Ser: 0.73 mg/dL (ref 0.44–1.00)
GFR, Estimated: >60 mL/min (ref >60)
Glucose, Bld: 104 mg/dL — ABNORMAL HIGH (ref 70–99)
Potassium: 3.1 mmol/L — ABNORMAL LOW (ref 3.5–5.1)
Sodium: 139 mmol/L (ref 135–145)
Total Bilirubin: 0.6 mg/dL (ref 0.3–1.2)
Total Protein: 6.7 g/dL (ref 6.5–8.1)

## 2021-01-25 LAB — CBC WITH DIFFERENTIAL/PLATELET
Abs Immature Granulocytes: 0.06 10*3/uL (ref 0.00–0.07)
Basophils Absolute: 0 10*3/uL (ref 0.0–0.1)
Basophils Relative: 1 %
Eosinophils Absolute: 0 10*3/uL (ref 0.0–0.5)
Eosinophils Relative: 1 %
HCT: 38.4 % (ref 36.0–46.0)
Hemoglobin: 12.5 g/dL (ref 12.0–15.0)
Immature Granulocytes: 1 %
Lymphocytes Relative: 30 %
Lymphs Abs: 1.9 10*3/uL (ref 0.7–4.0)
MCH: 32.5 pg (ref 26.0–34.0)
MCHC: 32.6 g/dL (ref 30.0–36.0)
MCV: 99.7 fL (ref 80.0–100.0)
Monocytes Absolute: 1 10*3/uL (ref 0.1–1.0)
Monocytes Relative: 16 %
Neutro Abs: 3.2 10*3/uL (ref 1.7–7.7)
Neutrophils Relative %: 51 %
Platelets: 72 10*3/uL — ABNORMAL LOW (ref 150–400)
RBC: 3.85 MIL/uL — ABNORMAL LOW (ref 3.87–5.11)
RDW: 15.5 % (ref 11.5–15.5)
WBC: 6.2 10*3/uL (ref 4.0–10.5)
nRBC: 0 % (ref 0.0–0.2)

## 2021-01-25 MED ORDER — HEPARIN SOD (PORK) LOCK FLUSH 100 UNIT/ML IV SOLN
INTRAVENOUS | Status: AC
  Filled 2021-01-25: qty 5

## 2021-01-25 MED ORDER — POTASSIUM CHLORIDE IN NACL 40-0.9 MEQ/L-% IV SOLN
Freq: Once | INTRAVENOUS | Status: AC
  Filled 2021-01-25: qty 1000

## 2021-01-25 MED ORDER — SODIUM CHLORIDE 0.9 % IV SOLN
Freq: Once | INTRAVENOUS | Status: AC
  Filled 2021-01-25: qty 250

## 2021-01-25 MED ORDER — SODIUM CHLORIDE 0.9 % IV SOLN: 40.0000 meq | Freq: Once | INTRAVENOUS | Status: DC

## 2021-01-25 MED ORDER — HEPARIN SOD (PORK) LOCK FLUSH 100 UNIT/ML IV SOLN
500.0000 [IU] | Freq: Once | INTRAVENOUS | Status: AC
  Administered 2021-01-25: 500 [IU] via INTRAVENOUS
  Filled 2021-01-25: qty 5

## 2021-01-25 MED ORDER — SODIUM CHLORIDE 0.9% FLUSH
10.0000 mL | Freq: Once | INTRAVENOUS | Status: AC
  Administered 2021-01-25: 10 mL via INTRAVENOUS
  Filled 2021-01-25: qty 10

## 2021-01-25 NOTE — Progress Notes (Signed)
Per Deirdre Peer., RN, no treatment today. Patient is to instead receive 40 mEq of Potassium.

## 2021-01-26 NOTE — Progress Notes (Signed)
[place]  Telephone:(336) [phone] Fax:(336) [phone]  ID: Veronica Boyd OB: 21-May-1946  MR#: [id number]  ENI#:[id number]  Patient Care Team: Tracie Harrier, MD as PCP - General (Internal Medicine) Lloyd Huger, MD as Consulting Physician (Oncology) Leonie Green, MD as Referring Physician (Surgery) [name], MD as Referring Physician (Radiation Oncology) Jacquelin Hawking, NP as Nurse Practitioner (Oncology) Clent Jacks, RN as Oncology Nurse Navigator   CHIEF COMPLAINT: Stage IV rectal cancer, history of follicular lymphoma.  INTERVAL HISTORY: Patient returns to clinic today for further evaluation and reconsideration of cycle 5 of 12 of FOLFOX.  She continues to feel well remains asymptomatic.  She admits to a decreased appetite and some mild weight loss.  She is tolerating her treatments without significant side effects.  She has no neurologic complaints.  She denies any recent fevers or illnesses.  She denies any chest pain, shortness of breath, cough, or hemoptysis. She denies any nausea, vomiting, constipation, or diarrhea. She has no urinary complaints.  Patient offers no further specific complaints today.  REVIEW OF SYSTEMS:   Review of Systems  Constitutional: Positive for weight loss. Negative for fever and malaise/fatigue.  Respiratory: Negative.  Negative for cough, hemoptysis and shortness of breath.   Cardiovascular: Negative.  Negative for chest pain and leg swelling.  Gastrointestinal: Negative.  Negative for abdominal pain, blood in stool, melena, nausea and vomiting.  Genitourinary: Negative.  Negative for dysuria.  Musculoskeletal: Negative.  Negative for back pain, joint pain and neck pain.  Skin: Negative.  Negative for rash.  Neurological: Negative.  Negative for sensory change, focal weakness, weakness and headaches.  Psychiatric/Behavioral: Negative.  The patient is not nervous/anxious and does not have  insomnia.    As per HPI. Otherwise, a complete review of systems is negative.   PAST MEDICAL HISTORY: Past Medical History:  Diagnosis Date  . Arthritis   . Cataract    bilateral  . Chicken pox   . Colon polyp   . Follicular lymphoma ([place]) 08/2016   lymph nodes   . Hyperlipidemia   . Osteoporosis     PAST SURGICAL HISTORY: Past Surgical History:  Procedure Laterality Date  . AXILLARY LYMPH NODE DISSECTION Right 08/21/2016   Procedure: AXILLARY LYMPH NODE excision;  Surgeon: Leonie Green, MD;  Location: [facility];  Service: General;  Laterality: Right;  . CATARACT EXTRACTION, BILATERAL Bilateral   . COLONOSCOPY N/A 09/19/2020   Procedure: COLONOSCOPY;  Surgeon: Lesly Rubenstein, MD;  Location: [facility];  Service: Endoscopy;  Laterality: N/A;  . EYE SURGERY    . JOINT REPLACEMENT Left   . PORTA CATH INSERTION N/A 09/16/2017   Procedure: PORTA CATH INSERTION;  Surgeon: Algernon Huxley, MD;  Location: [place] [facility];  Service: Cardiovascular;  Laterality: N/A;  . TONSILLECTOMY    . TOTAL HIP ARTHROPLASTY Left 1992    FAMILY HISTORY: Family History  Problem Relation Age of Onset  . Diabetes Sister   . Lung cancer Brother   . Diabetes Brother   . Basal cell carcinoma Daughter   . Breast cancer Paternal Aunt     ADVANCED DIRECTIVES (Y/N):  N  HEALTH MAINTENANCE: Social History   Tobacco Use  . Smoking status: Never Smoker  . Smokeless tobacco: Never Used  Vaping Use  . Vaping Use: Never used  Substance Use Topics  . Alcohol use: No  . Drug use: No     Colonoscopy:  PAP:  Bone density:  Lipid  panel:  No Known Allergies  Current Outpatient Medications  Medication Sig Dispense Refill  . alendronate (FOSAMAX) 70 MG tablet TAKE 1 TABLET EVERY 7 DAYS TAKE WITH A FULL GLASS OF WATER. DO NOT LIE DOWN FOR THE NEXT 30 MIN.  3  . diclofenac sodium (VOLTAREN) 1 % GEL APPLY 2 GRAMS TOPICALLY 2 (TWO) TIMES DAILY    . lidocaine-prilocaine (EMLA)  cream APPLY 1 APPLICATION TOPICALLY AS NEEDED. 30 g 1  . lidocaine-prilocaine (EMLA) cream Apply to affected area once 30 g 3  . Multiple Vitamins-Minerals (CENTRUM SILVER PO) Take 1 tablet by mouth daily.    . ondansetron (ZOFRAN) 8 MG tablet Take 1 tablet (8 mg total) by mouth 2 (two) times daily as needed for refractory nausea / vomiting. Start on day 3 after chemotherapy. 60 tablet 1  . pantoprazole (PROTONIX) 20 MG tablet Take 20 mg by mouth at bedtime.     . prochlorperazine (COMPAZINE) 10 MG tablet TAKE 1 TABLET (10 MG TOTAL) BY MOUTH EVERY 6 (SIX) HOURS AS NEEDED (NAUSEA OR VOMITING). 60 tablet 1  . simvastatin (ZOCOR) 20 MG tablet TAKE 1 TABLET BY MOUTH EVERY DAY AT NIGHT    . tizanidine (ZANAFLEX) 2 MG capsule Take 2 mg by mouth 3 (three) times daily.    . traMADol-acetaminophen (ULTRACET) 37.5-325 MG tablet Take 1 tablet by mouth every 8 (eight) hours as needed.     . ibandronate (BONIVA) 150 MG tablet PLEASE SEE ATTACHED FOR DETAILED DIRECTIONS (Patient not taking: No sig reported)     No current facility-administered medications for this visit.   Facility-Administered Medications Ordered in Other Visits  Medication Dose Route Frequency Provider Last Rate Last Admin  . fluorouracil (ADRUCIL) 3,500 mg in sodium chloride 0.9 % 80 mL chemo infusion  2,400 mg/m2 (Treatment Plan Recorded) Intravenous 1 day or 1 dose Lloyd Huger, MD   3,500 mg at 02/01/21 1243  . heparin lock flush 100 unit/mL  500 Units Intravenous Once Lloyd Huger, MD      . heparin lock flush 100 unit/mL  500 Units Intracatheter PRN Lloyd Huger, MD      . sodium chloride flush (NS) 0.9 % injection 10 mL  10 mL Intravenous PRN Lloyd Huger, MD   10 mL at 12/04/17 0901  . sodium chloride flush (NS) 0.9 % injection 10 mL  10 mL Intracatheter PRN Lloyd Huger, MD      . sodium chloride flush (NS) 0.9 % injection 10 mL  10 mL Intravenous PRN Lloyd Huger, MD   10 mL at 02/01/21  0829  . yttrium-90 injection 26.7 millicurie  12.4 millicurie Intravenous Once Chrystal, Eulas Post, MD        OBJECTIVE: Vitals:   02/01/21 0844  BP: 119/76  Pulse: 93  Resp: 20  Temp: 97.7 F (36.5 C)     Body mass index is 22.47 kg/m.    ECOG FS:0 - Asymptomatic  General: Thin, no acute distress. Eyes: Pink conjunctiva, anicteric sclera. HEENT: Normocephalic, moist mucous membranes. Lungs: No audible wheezing or coughing. Heart: Regular rate and rhythm. Abdomen: Soft, nontender, no obvious distention. Musculoskeletal: No edema, cyanosis, or clubbing. Neuro: Alert, answering all questions appropriately. Cranial nerves grossly intact. Skin: No rashes or petechiae noted. Psych: Normal affect.   LAB RESULTS:  Lab Results  Component Value Date   NA 139 02/01/2021   K 4.2 02/01/2021   CL 103 02/01/2021   CO2 27 02/01/2021   GLUCOSE 93 02/01/2021  BUN 11 02/01/2021   CREATININE 0.72 02/01/2021   CALCIUM 9.2 02/01/2021   PROT 7.2 02/01/2021   ALBUMIN 4.1 02/01/2021   AST 28 02/01/2021   ALT 16 02/01/2021   ALKPHOS 70 02/01/2021   BILITOT 0.7 02/01/2021   GFRNONAA >60 02/01/2021   GFRAA >60 06/28/2020    Lab Results  Component Value Date   WBC 6.5 02/01/2021   NEUTROABS 3.0 02/01/2021   HGB 12.9 02/01/2021   HCT 39.8 02/01/2021   MCV 101.8 (H) 02/01/2021   PLT 147 (L) 02/01/2021     STUDIES: No results found.   ASSESSMENT: Stage IV rectal cancer, history of follicular lymphoma. PLAN:    1.  Stage IV rectal cancer: Biopsy confirmed rectal cancer.  MRI completed at [facility] on October 11, 2020 revealed extension of the tumor beyond the wall into the rectovaginal recess with suspected invasion of the vaginal wall posteriorly.  Tumor also appears to involve the internal anal sphincter.  There are also numerous prominent presacral and mesorectal lymph nodes highly suspicious for malignancy.  PET scan results from November 17, 2020 reviewed independently with  3 hypermetabolic right lower lobe lung nodules consistent with pulmonary metastasis.  Patient is unlikely a surgical candidate, but will plan on giving 6 cycles of neoadjuvant FOLFOX and then reconsider surgery and radiation at that time.  Proceed with cycle 5 of FOLFOX today.  Return to clinic in 2 days for pump removal and Ziextenzo and then in 2 weeks for further evaluation and consideration of cycle 6.      2. Recurrent follicular lymphoma: CT scan results from December 23, 2019 reviewed independently with no obvious evidence of recurrent or progressive disease.  PET scan results as above consistent with rectal cancer metastasis and no evidence of lymphoma.   Patient received Zevalin on July 30, 2017 with not much therapeutic effect.  She subsequently underwent cycles 5 of R-CHOP chemotherapy with Neulasta support completing on December 12, 2017.  Patient continues to be in complete remission.  3.  Hypokalemia: Resolved.  Continue oral supplementation. 4.  Neutropenia: Resolved, but patient will require Ziextenzo with her pump removal for the remaining of cycles. 5.  Thrombocytopenia: Resolved.  Proceed with treatment as above. 6.  Covid infection: Resolved.  Patient is now asymptomatic.  Patient expressed understanding and was in agreement with this plan. She also understands that She can call clinic at any time with any questions, concerns, or complaints.   Cancer Staging 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 [facility]) Staging form: Lymphoid Neoplasms, AJCC 6th Edition - Clinical stage from 08/27/2016: Stage IIE (lymphoma only) - Signed by Lloyd Huger, MD on 08/27/2016  Rectal cancer [facility]) Staging form: Colon and Rectum, AJCC 8th Edition - Clinical: Stage IVA Larwance Sachs, Noelle.Marry) - Signed by Lloyd Huger, MD on 11/02/2020   Lloyd Huger, MD   02/01/2021 1:51 PM

## 2021-01-27 ENCOUNTER — Inpatient Hospital Stay: Payer: Medicare Other

## 2021-02-01 ENCOUNTER — Inpatient Hospital Stay (HOSPITAL_BASED_OUTPATIENT_CLINIC_OR_DEPARTMENT_OTHER): Payer: Medicare Other | Admitting: Oncology

## 2021-02-01 ENCOUNTER — Inpatient Hospital Stay: Payer: Medicare Other

## 2021-02-01 ENCOUNTER — Encounter: Payer: Self-pay | Admitting: Oncology

## 2021-02-01 VITALS — BP 119/76 | HR 93 | Temp 97.7°F | Resp 20 | Wt 107.5 lb

## 2021-02-01 DIAGNOSIS — C20 Malignant neoplasm of rectum: Secondary | ICD-10-CM

## 2021-02-01 DIAGNOSIS — Z5111 Encounter for antineoplastic chemotherapy: Secondary | ICD-10-CM | POA: Diagnosis not present

## 2021-02-01 LAB — CBC WITH DIFFERENTIAL/PLATELET
Abs Immature Granulocytes: 0.03 10*3/uL (ref 0.00–0.07)
Basophils Absolute: 0 10*3/uL (ref 0.0–0.1)
Basophils Relative: 0 %
Eosinophils Absolute: 0 10*3/uL (ref 0.0–0.5)
Eosinophils Relative: 1 %
HCT: 39.8 % (ref 36.0–46.0)
Hemoglobin: 12.9 g/dL (ref 12.0–15.0)
Immature Granulocytes: 1 %
Lymphocytes Relative: 30 %
Lymphs Abs: 2 10*3/uL (ref 0.7–4.0)
MCH: 33 pg (ref 26.0–34.0)
MCHC: 32.4 g/dL (ref 30.0–36.0)
MCV: 101.8 fL — ABNORMAL HIGH (ref 80.0–100.0)
Monocytes Absolute: 1.5 10*3/uL — ABNORMAL HIGH (ref 0.1–1.0)
Monocytes Relative: 23 %
Neutro Abs: 3 10*3/uL (ref 1.7–7.7)
Neutrophils Relative %: 45 %
Platelets: 147 10*3/uL — ABNORMAL LOW (ref 150–400)
RBC: 3.91 MIL/uL (ref 3.87–5.11)
RDW: 15.8 % — ABNORMAL HIGH (ref 11.5–15.5)
WBC: 6.5 10*3/uL (ref 4.0–10.5)
nRBC: 0 % (ref 0.0–0.2)

## 2021-02-01 LAB — COMPREHENSIVE METABOLIC PANEL
ALT: 16 U/L (ref 0–44)
AST: 28 U/L (ref 15–41)
Albumin: 4.1 g/dL (ref 3.5–5.0)
Alkaline Phosphatase: 70 U/L (ref 38–126)
Anion gap: 9 (ref 5–15)
BUN: 11 mg/dL (ref 8–23)
CO2: 27 mmol/L (ref 22–32)
Calcium: 9.2 mg/dL (ref 8.9–10.3)
Chloride: 103 mmol/L (ref 98–111)
Creatinine, Ser: 0.72 mg/dL (ref 0.44–1.00)
GFR, Estimated: >60 mL/min (ref >60)
Glucose, Bld: 93 mg/dL (ref 70–99)
Potassium: 4.2 mmol/L (ref 3.5–5.1)
Sodium: 139 mmol/L (ref 135–145)
Total Bilirubin: 0.7 mg/dL (ref 0.3–1.2)
Total Protein: 7.2 g/dL (ref 6.5–8.1)

## 2021-02-01 MED ORDER — SODIUM CHLORIDE 0.9 % IV SOLN
2400.0000 mg/m2 | INTRAVENOUS | Status: DC
  Administered 2021-02-01: 3500 mg via INTRAVENOUS
  Filled 2021-02-01: qty 70

## 2021-02-01 MED ORDER — PALONOSETRON HCL INJECTION 0.25 MG/5ML
0.2500 mg | Freq: Once | INTRAVENOUS | Status: AC
  Administered 2021-02-01: 0.25 mg via INTRAVENOUS
  Filled 2021-02-01: qty 5

## 2021-02-01 MED ORDER — FLUOROURACIL CHEMO INJECTION 2.5 GM/50ML
400.0000 mg/m2 | Freq: Once | INTRAVENOUS | Status: AC
Start: 2021-02-01 — End: 2021-02-01
  Administered 2021-02-01: 600 mg via INTRAVENOUS
  Filled 2021-02-01: qty 12

## 2021-02-01 MED ORDER — SODIUM CHLORIDE 0.9 % IV SOLN
10.0000 mg | Freq: Once | INTRAVENOUS | Status: AC
  Administered 2021-02-01: 10 mg via INTRAVENOUS
  Filled 2021-02-01: qty 10

## 2021-02-01 MED ORDER — SODIUM CHLORIDE 0.9% FLUSH
10.0000 mL | INTRAVENOUS | Status: DC | PRN
  Administered 2021-02-01: 10 mL via INTRAVENOUS
  Filled 2021-02-01: qty 10

## 2021-02-01 MED ORDER — OXALIPLATIN CHEMO INJECTION 100 MG/20ML
85.0000 mg/m2 | Freq: Once | INTRAVENOUS | Status: AC
  Administered 2021-02-01: 125 mg via INTRAVENOUS
  Filled 2021-02-01: qty 20

## 2021-02-01 MED ORDER — LEUCOVORIN CALCIUM INJECTION 350 MG
411.0000 mg/m2 | Freq: Once | INTRAMUSCULAR | Status: AC
  Administered 2021-02-01: 600 mg via INTRAVENOUS
  Filled 2021-02-01: qty 5

## 2021-02-01 MED ORDER — DEXTROSE 5 % IV SOLN
Freq: Once | INTRAVENOUS | Status: AC
  Filled 2021-02-01: qty 250

## 2021-02-01 NOTE — Progress Notes (Signed)
Patient denies any concerns today.  

## 2021-02-03 ENCOUNTER — Other Ambulatory Visit: Payer: Self-pay

## 2021-02-03 ENCOUNTER — Inpatient Hospital Stay: Payer: Medicare Other

## 2021-02-03 ENCOUNTER — Inpatient Hospital Stay: Payer: Medicare Other | Attending: Nurse Practitioner

## 2021-02-03 DIAGNOSIS — Z5189 Encounter for other specified aftercare: Secondary | ICD-10-CM | POA: Insufficient documentation

## 2021-02-03 DIAGNOSIS — C20 Malignant neoplasm of rectum: Secondary | ICD-10-CM | POA: Diagnosis present

## 2021-02-03 DIAGNOSIS — Z5111 Encounter for antineoplastic chemotherapy: Secondary | ICD-10-CM | POA: Insufficient documentation

## 2021-02-03 DIAGNOSIS — C7801 Secondary malignant neoplasm of right lung: Secondary | ICD-10-CM | POA: Diagnosis not present

## 2021-02-03 MED ORDER — HEPARIN SOD (PORK) LOCK FLUSH 100 UNIT/ML IV SOLN
INTRAVENOUS | Status: AC
  Filled 2021-02-03: qty 5

## 2021-02-03 MED ORDER — SODIUM CHLORIDE 0.9% FLUSH
10.0000 mL | INTRAVENOUS | Status: DC | PRN
  Administered 2021-02-03: 10 mL
  Filled 2021-02-03: qty 10

## 2021-02-03 MED ORDER — PEGFILGRASTIM-BMEZ 6 MG/0.6ML ~~LOC~~ SOSY
6.0000 mg | PREFILLED_SYRINGE | Freq: Once | SUBCUTANEOUS | Status: AC
  Administered 2021-02-03: 6 mg via SUBCUTANEOUS
  Filled 2021-02-03: qty 0.6

## 2021-02-03 MED ORDER — HEPARIN SOD (PORK) LOCK FLUSH 100 UNIT/ML IV SOLN
500.0000 [IU] | Freq: Once | INTRAVENOUS | Status: AC | PRN
  Administered 2021-02-03: 500 [IU]
  Filled 2021-02-03: qty 5

## 2021-02-11 NOTE — Progress Notes (Signed)
[place]  [phone] [phone]  ID: Veronica Boyd OB: 1946/02/17  MR#: [id number]  HWE#:[id number]  Patient Care Team: Tracie Harrier, MD as PCP - General (Internal Medicine) Lloyd Huger, MD as Consulting Physician (Oncology) Leonie Green, MD as Referring Physician (Surgery) [name], MD as Referring Physician (Radiation Oncology) Jacquelin Hawking, NP as Nurse Practitioner (Oncology) Clent Jacks, RN as Oncology Nurse Navigator   CHIEF COMPLAINT: Stage IV rectal cancer, history of follicular lymphoma.  INTERVAL HISTORY: Patient returns to clinic today for further evaluation and consideration of cycle 6 of 12 of FOLFOX.  She continues to tolerate her treatments well without significant side effects.  She currently feels well and is asymptomatic.  Her appetite has improved and her weight is stable.  She has no neurologic complaints.  She denies any recent fevers or illnesses.  She denies any chest pain, shortness of breath, cough, or hemoptysis. She denies any nausea, vomiting, constipation, or diarrhea. She has no urinary complaints.  Patient offers no specific complaints today.  REVIEW OF SYSTEMS:   Review of Systems  Constitutional: Negative.  Negative for fever, malaise/fatigue and weight loss.  Respiratory: Negative.  Negative for cough, hemoptysis and shortness of breath.   Cardiovascular: Negative.  Negative for chest pain and leg swelling.  Gastrointestinal: Negative.  Negative for abdominal pain, blood in stool, melena, nausea and vomiting.  Genitourinary: Negative.  Negative for dysuria.  Musculoskeletal: Negative.  Negative for back pain, joint pain and neck pain.  Skin: Negative.  Negative for rash.  Neurological: Negative.  Negative for sensory change, focal weakness, weakness and headaches.  Psychiatric/Behavioral: Negative.  The patient is not nervous/anxious and does not have insomnia.    As  per HPI. Otherwise, a complete review of systems is negative.   PAST MEDICAL HISTORY: Past Medical History:  Diagnosis Date  . Arthritis   . Cataract    bilateral  . Chicken pox   . Colon polyp   . Follicular lymphoma ([place]) 08/2016   lymph nodes   . Hyperlipidemia   . Osteoporosis     PAST SURGICAL HISTORY: Past Surgical History:  Procedure Laterality Date  . AXILLARY LYMPH NODE DISSECTION Right 08/21/2016   Procedure: AXILLARY LYMPH NODE excision;  Surgeon: Leonie Green, MD;  Location: [facility];  Service: General;  Laterality: Right;  . CATARACT EXTRACTION, BILATERAL Bilateral   . COLONOSCOPY N/A 09/19/2020   Procedure: COLONOSCOPY;  Surgeon: Lesly Rubenstein, MD;  Location: [facility];  Service: Endoscopy;  Laterality: N/A;  . EYE SURGERY    . JOINT REPLACEMENT Left   . PORTA CATH INSERTION N/A 09/16/2017   Procedure: PORTA CATH INSERTION;  Surgeon: Algernon Huxley, MD;  Location: [facility];  Service: Cardiovascular;  Laterality: N/A;  . TONSILLECTOMY    . TOTAL HIP ARTHROPLASTY Left 1992    FAMILY HISTORY: Family History  Problem Relation Age of Onset  . Diabetes Sister   . Lung cancer Brother   . Diabetes Brother   . Basal cell carcinoma Daughter   . Breast cancer Paternal Aunt     ADVANCED DIRECTIVES (Y/N):  N  HEALTH MAINTENANCE: Social History   Tobacco Use  . Smoking status: Never Smoker  . Smokeless tobacco: Never Used  Vaping Use  . Vaping Use: Never used  Substance Use Topics  . Alcohol use: No  . Drug use: No     Colonoscopy:  PAP:  Bone density:  Lipid panel:  No Known Allergies  Current Outpatient Medications  Medication Sig Dispense Refill  . alendronate (FOSAMAX) 70 MG tablet TAKE 1 TABLET EVERY 7 DAYS TAKE WITH A FULL GLASS OF WATER. DO NOT LIE DOWN FOR THE NEXT 30 MIN.  3  . diclofenac sodium (VOLTAREN) 1 % GEL APPLY 2 GRAMS TOPICALLY 2 (TWO) TIMES DAILY    . lidocaine-prilocaine (EMLA) cream APPLY 1  APPLICATION TOPICALLY AS NEEDED. 30 g 1  . lidocaine-prilocaine (EMLA) cream Apply to affected area once 30 g 3  . Multiple Vitamins-Minerals (CENTRUM SILVER PO) Take 1 tablet by mouth daily.    . ondansetron (ZOFRAN) 8 MG tablet Take 1 tablet (8 mg total) by mouth 2 (two) times daily as needed for refractory nausea / vomiting. Start on day 3 after chemotherapy. 60 tablet 1  . pantoprazole (PROTONIX) 20 MG tablet Take 20 mg by mouth at bedtime.     . prochlorperazine (COMPAZINE) 10 MG tablet TAKE 1 TABLET (10 MG TOTAL) BY MOUTH EVERY 6 (SIX) HOURS AS NEEDED (NAUSEA OR VOMITING). 60 tablet 1  . simvastatin (ZOCOR) 20 MG tablet TAKE 1 TABLET BY MOUTH EVERY DAY AT NIGHT    . tizanidine (ZANAFLEX) 2 MG capsule Take 2 mg by mouth 3 (three) times daily.    . traMADol-acetaminophen (ULTRACET) 37.5-325 MG tablet Take 1 tablet by mouth every 8 (eight) hours as needed.     . ibandronate (BONIVA) 150 MG tablet PLEASE SEE ATTACHED FOR DETAILED DIRECTIONS (Patient not taking: No sig reported)     No current facility-administered medications for this visit.   Facility-Administered Medications Ordered in Other Visits  Medication Dose Route Frequency Provider Last Rate Last Admin  . fluorouracil (ADRUCIL) 3,500 mg in sodium chloride 0.9 % 80 mL chemo infusion  2,400 mg/m2 (Treatment Plan Recorded) Intravenous 1 day or 1 dose Lloyd Huger, MD      . fluorouracil (ADRUCIL) chemo injection 600 mg  400 mg/m2 (Treatment Plan Recorded) Intravenous Once Lloyd Huger, MD      . heparin lock flush 100 unit/mL  500 Units Intravenous Once Lloyd Huger, MD      . heparin lock flush 100 unit/mL  500 Units Intracatheter PRN Lloyd Huger, MD      . heparin lock flush 100 unit/mL  500 Units Intravenous Once Lloyd Huger, MD      . leucovorin 600 mg in dextrose 5 % 250 mL infusion  411 mg/m2 (Treatment Plan Recorded) Intravenous Once Lloyd Huger, MD      . oxaliplatin (ELOXATIN) 125 mg  in dextrose 5 % 500 mL chemo infusion  85 mg/m2 (Treatment Plan Recorded) Intravenous Once Lloyd Huger, MD      . sodium chloride flush (NS) 0.9 % injection 10 mL  10 mL Intravenous PRN Lloyd Huger, MD   10 mL at 12/04/17 0901  . sodium chloride flush (NS) 0.9 % injection 10 mL  10 mL Intracatheter PRN Lloyd Huger, MD      . sodium chloride flush (NS) 0.9 % injection 10 mL  10 mL Intracatheter PRN Lloyd Huger, MD      . yttrium-90 injection 66.0 millicurie  63.0 millicurie Intravenous Once [name], MD        OBJECTIVE: Vitals:   02/15/21 0832  BP: 128/76  Pulse: 83  Resp: 16  Temp: (!) 97.1 F (36.2 C)     Body mass index is 22.4 kg/m.    ECOG FS:0 -  Asymptomatic  General: Well-developed, well-nourished, no acute distress. Eyes: Pink conjunctiva, anicteric sclera. HEENT: Normocephalic, moist mucous membranes. Lungs: No audible wheezing or coughing. Heart: Regular rate and rhythm. Abdomen: Soft, nontender, no obvious distention. Musculoskeletal: No edema, cyanosis, or clubbing. Neuro: Alert, answering all questions appropriately. Cranial nerves grossly intact. Skin: No rashes or petechiae noted. Psych: Normal affect.   LAB RESULTS:  Lab Results  Component Value Date   NA 139 02/15/2021   K 4.1 02/15/2021   CL 103 02/15/2021   CO2 27 02/15/2021   GLUCOSE 98 02/15/2021   BUN 9 02/15/2021   CREATININE 0.62 02/15/2021   CALCIUM 9.4 02/15/2021   PROT 7.2 02/15/2021   ALBUMIN 4.0 02/15/2021   AST 27 02/15/2021   ALT 14 02/15/2021   ALKPHOS 93 02/15/2021   BILITOT 0.5 02/15/2021   GFRNONAA >60 02/15/2021   GFRAA >60 06/28/2020    Lab Results  Component Value Date   WBC 9.5 02/15/2021   NEUTROABS 5.8 02/15/2021   HGB 12.3 02/15/2021   HCT 38.3 02/15/2021   MCV 101.6 (H) 02/15/2021   PLT 88 (L) 02/15/2021     STUDIES: No results found.   ASSESSMENT: Stage IV rectal cancer, history of follicular lymphoma. PLAN:    1.   Stage IV rectal cancer: Biopsy confirmed rectal cancer.  [facility] completed [facility] on October 11, 2020 revealed extension of the tumor beyond the wall into the rectovaginal recess with suspected invasion of the vaginal wall posteriorly.  Tumor also appears to involve the internal anal sphincter.  There are also numerous prominent presacral and mesorectal lymph nodes highly suspicious for malignancy.  PET scan results from November 17, 2020 reviewed independently with 3 hypermetabolic right lower lobe lung nodules consistent with pulmonary metastasis.  Patient is unlikely a surgical candidate, but will plan on giving 6 cycles of neoadjuvant FOLFOX and then reconsider surgery and radiation at that time.  Despite thrombocytopenia, will proceed with cycle 6 of FOLFOX today.  Patient now will only receive treatment every 3 weeks. Return to clinic in 2 days for pump removal and Ziextenzo, but Ziextenzo may not be needed in the future since we are increasing the amount of time between treatments.  Return to clinic in 3 weeks for further evaluation and consideration of cycle 7.  Will repeat PET scan prior to next treatment.      2. Recurrent follicular lymphoma: CT scan results from December 23, 2019 reviewed independently with no obvious evidence of recurrent or progressive disease.  PET scan results as above consistent with rectal cancer metastasis and no evidence of lymphoma.   Patient received Zevalin on July 30, 2017 with not much therapeutic effect.  She subsequently underwent cycles 5 of R-CHOP chemotherapy with Neulasta support completing on December 12, 2017.  Patient continues to be in complete remission.  3.  Hypokalemia: Resolved.  Continue oral supplementation. 4.  Neutropenia: Resolved, proceed with Ziextenzo with her pump removal, but this may not be necessary for subsequent cycles given the extended time between chemotherapy.   5.  Thrombocytopenia: Proceed cautiously with treatment as above.   Patient will now receive treatment every 3 weeks. 6.  Covid infection: Resolved.  Patient is now asymptomatic.  Patient expressed understanding and was in agreement with this plan. She also understands that She can call clinic at any time with any questions, concerns, or complaints.   Cancer Staging 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 [facility]) Staging form: Lymphoid Neoplasms, AJCC  6th Edition - Clinical stage from 08/27/2016: Stage IIE (lymphoma only) - Signed by Lloyd Huger, MD on 08/27/2016  Rectal cancer [facility]) Staging form: Colon and Rectum, AJCC 8th Edition - Clinical: Stage IVA Larwance Sachs, Noelle.Marry) - Signed by Lloyd Huger, MD on 11/02/2020   Lloyd Huger, MD   02/15/2021 10:25 AM

## 2021-02-15 ENCOUNTER — Other Ambulatory Visit: Payer: Self-pay

## 2021-02-15 ENCOUNTER — Inpatient Hospital Stay (HOSPITAL_BASED_OUTPATIENT_CLINIC_OR_DEPARTMENT_OTHER): Payer: Medicare Other | Admitting: Oncology

## 2021-02-15 ENCOUNTER — Inpatient Hospital Stay: Payer: Medicare Other

## 2021-02-15 ENCOUNTER — Encounter: Payer: Self-pay | Admitting: Oncology

## 2021-02-15 VITALS — BP 128/76 | HR 83 | Temp 97.1°F | Resp 16 | Wt 107.2 lb

## 2021-02-15 DIAGNOSIS — Z95828 Presence of other vascular implants and grafts: Secondary | ICD-10-CM

## 2021-02-15 DIAGNOSIS — C20 Malignant neoplasm of rectum: Secondary | ICD-10-CM

## 2021-02-15 DIAGNOSIS — Z5111 Encounter for antineoplastic chemotherapy: Secondary | ICD-10-CM | POA: Diagnosis not present

## 2021-02-15 LAB — COMPREHENSIVE METABOLIC PANEL
ALT: 14 U/L (ref 0–44)
AST: 27 U/L (ref 15–41)
Albumin: 4 g/dL (ref 3.5–5.0)
Alkaline Phosphatase: 93 U/L (ref 38–126)
Anion gap: 9 (ref 5–15)
BUN: 9 mg/dL (ref 8–23)
CO2: 27 mmol/L (ref 22–32)
Calcium: 9.4 mg/dL (ref 8.9–10.3)
Chloride: 103 mmol/L (ref 98–111)
Creatinine, Ser: 0.62 mg/dL (ref 0.44–1.00)
GFR, Estimated: >60 mL/min (ref >60)
Glucose, Bld: 98 mg/dL (ref 70–99)
Potassium: 4.1 mmol/L (ref 3.5–5.1)
Sodium: 139 mmol/L (ref 135–145)
Total Bilirubin: 0.5 mg/dL (ref 0.3–1.2)
Total Protein: 7.2 g/dL (ref 6.5–8.1)

## 2021-02-15 LAB — CBC WITH DIFFERENTIAL/PLATELET
Abs Immature Granulocytes: 0.1 10*3/uL — ABNORMAL HIGH (ref 0.00–0.07)
Basophils Absolute: 0 10*3/uL (ref 0.0–0.1)
Basophils Relative: 0 %
Eosinophils Absolute: 0 10*3/uL (ref 0.0–0.5)
Eosinophils Relative: 0 %
HCT: 38.3 % (ref 36.0–46.0)
Hemoglobin: 12.3 g/dL (ref 12.0–15.0)
Immature Granulocytes: 1 %
Lymphocytes Relative: 21 %
Lymphs Abs: 2 10*3/uL (ref 0.7–4.0)
MCH: 32.6 pg (ref 26.0–34.0)
MCHC: 32.1 g/dL (ref 30.0–36.0)
MCV: 101.6 fL — ABNORMAL HIGH (ref 80.0–100.0)
Monocytes Absolute: 1.5 10*3/uL — ABNORMAL HIGH (ref 0.1–1.0)
Monocytes Relative: 16 %
Neutro Abs: 5.8 10*3/uL (ref 1.7–7.7)
Neutrophils Relative %: 62 %
Platelets: 88 10*3/uL — ABNORMAL LOW (ref 150–400)
RBC: 3.77 MIL/uL — ABNORMAL LOW (ref 3.87–5.11)
RDW: 15.3 % (ref 11.5–15.5)
WBC: 9.5 10*3/uL (ref 4.0–10.5)
nRBC: 0 % (ref 0.0–0.2)

## 2021-02-15 MED ORDER — FLUOROURACIL CHEMO INJECTION 2.5 GM/50ML
400.0000 mg/m2 | Freq: Once | INTRAVENOUS | Status: AC
  Administered 2021-02-15: 600 mg via INTRAVENOUS
  Filled 2021-02-15: qty 12

## 2021-02-15 MED ORDER — PALONOSETRON HCL INJECTION 0.25 MG/5ML
0.2500 mg | Freq: Once | INTRAVENOUS | Status: AC
  Administered 2021-02-15: 0.25 mg via INTRAVENOUS
  Filled 2021-02-15: qty 5

## 2021-02-15 MED ORDER — SODIUM CHLORIDE 0.9 % IV SOLN
10.0000 mg | Freq: Once | INTRAVENOUS | Status: AC
  Administered 2021-02-15: 10 mg via INTRAVENOUS
  Filled 2021-02-15: qty 10

## 2021-02-15 MED ORDER — HEPARIN SOD (PORK) LOCK FLUSH 100 UNIT/ML IV SOLN
500.0000 [IU] | Freq: Once | INTRAVENOUS | Status: AC
Start: 2021-02-15 — End: ?
  Filled 2021-02-15: qty 5

## 2021-02-15 MED ORDER — LEUCOVORIN CALCIUM INJECTION 350 MG
411.0000 mg/m2 | Freq: Once | INTRAVENOUS | Status: AC
  Administered 2021-02-15: 600 mg via INTRAVENOUS
  Filled 2021-02-15: qty 30

## 2021-02-15 MED ORDER — SODIUM CHLORIDE 0.9 % IV SOLN
2400.0000 mg/m2 | INTRAVENOUS | Status: DC
  Administered 2021-02-15: 3500 mg via INTRAVENOUS
  Filled 2021-02-15: qty 70

## 2021-02-15 MED ORDER — SODIUM CHLORIDE 0.9% FLUSH
10.0000 mL | Freq: Once | INTRAVENOUS | Status: AC
  Administered 2021-02-15: 10 mL via INTRAVENOUS
  Filled 2021-02-15: qty 10

## 2021-02-15 MED ORDER — OXALIPLATIN CHEMO INJECTION 100 MG/20ML
85.0000 mg/m2 | Freq: Once | INTRAVENOUS | Status: AC
  Administered 2021-02-15: 125 mg via INTRAVENOUS
  Filled 2021-02-15: qty 20

## 2021-02-15 MED ORDER — SODIUM CHLORIDE 0.9% FLUSH
10.0000 mL | INTRAVENOUS | Status: DC | PRN
  Filled 2021-02-15: qty 10

## 2021-02-15 MED ORDER — DEXTROSE 5 % IV SOLN
Freq: Once | INTRAVENOUS | Status: AC
  Filled 2021-02-15: qty 250

## 2021-02-15 NOTE — Progress Notes (Signed)
Patient here for pretreatment check no questions today. Patient getting treatment today per Dr. Maryjane Hurter he will adjust parameters for treatment.

## 2021-02-17 ENCOUNTER — Other Ambulatory Visit: Payer: Self-pay

## 2021-02-17 ENCOUNTER — Inpatient Hospital Stay: Payer: Medicare Other

## 2021-02-17 DIAGNOSIS — C20 Malignant neoplasm of rectum: Secondary | ICD-10-CM

## 2021-02-17 DIAGNOSIS — Z5111 Encounter for antineoplastic chemotherapy: Secondary | ICD-10-CM | POA: Diagnosis not present

## 2021-02-17 MED ORDER — PEGFILGRASTIM-BMEZ 6 MG/0.6ML ~~LOC~~ SOSY
6.0000 mg | PREFILLED_SYRINGE | Freq: Once | SUBCUTANEOUS | Status: AC
  Administered 2021-02-17: 6 mg via SUBCUTANEOUS
  Filled 2021-02-17: qty 0.6

## 2021-02-17 MED ORDER — SODIUM CHLORIDE 0.9% FLUSH
10.0000 mL | INTRAVENOUS | Status: DC | PRN
  Administered 2021-02-17: 10 mL
  Filled 2021-02-17: qty 10

## 2021-02-17 MED ORDER — HEPARIN SOD (PORK) LOCK FLUSH 100 UNIT/ML IV SOLN
500.0000 [IU] | Freq: Once | INTRAVENOUS | Status: AC | PRN
  Administered 2021-02-17: 500 [IU]
  Filled 2021-02-17: qty 5

## 2021-02-20 ENCOUNTER — Other Ambulatory Visit: Payer: Self-pay | Admitting: Oncology

## 2021-02-20 DIAGNOSIS — C20 Malignant neoplasm of rectum: Secondary | ICD-10-CM

## 2021-03-01 ENCOUNTER — Other Ambulatory Visit: Payer: Self-pay

## 2021-03-01 ENCOUNTER — Ambulatory Visit
Admission: RE | Admit: 2021-03-01 | Discharge: 2021-03-01 | Disposition: A | Discharge status: Home / Self Care | Payer: Medicare Other | Source: Ambulatory Visit | Attending: Oncology | Admitting: Oncology

## 2021-03-01 DIAGNOSIS — J439 Emphysema, unspecified: Secondary | ICD-10-CM | POA: Diagnosis not present

## 2021-03-01 DIAGNOSIS — C20 Malignant neoplasm of rectum: Secondary | ICD-10-CM | POA: Diagnosis not present

## 2021-03-01 DIAGNOSIS — I700 Atherosclerosis of aorta: Secondary | ICD-10-CM | POA: Insufficient documentation

## 2021-03-01 DIAGNOSIS — I2510 Atherosclerotic heart disease of native coronary artery without angina pectoris: Secondary | ICD-10-CM | POA: Insufficient documentation

## 2021-03-01 LAB — GLUCOSE, CAPILLARY: Glucose-Capillary: 93 mg/dL (ref 70–99)

## 2021-03-01 MED ORDER — FLUDEOXYGLUCOSE F - 18 (FDG) INJECTION
5.5000 | Freq: Once | INTRAVENOUS | Status: AC | PRN
  Administered 2021-03-01: 5.93 via INTRAVENOUS

## 2021-03-02 ENCOUNTER — Other Ambulatory Visit: Payer: Self-pay | Admitting: Oncology

## 2021-03-04 NOTE — Progress Notes (Signed)
[place]  [phone]) [phone] [phone]) [phone]  ID: Veronica Boyd OB: 1946/04/05  MR#: [id number]  WUX#:[id number]  Patient Care Team: Tracie Harrier, MD as PCP - General (Internal Medicine) Lloyd Huger, MD as Consulting Physician (Oncology) Leonie Green, MD as Referring Physician (Surgery) [name], MD as Referring Physician (Radiation Oncology) Jacquelin Hawking, NP as Nurse Practitioner (Oncology) Clent Jacks, RN as Oncology Nurse Navigator   CHIEF COMPLAINT: Stage IV rectal cancer, history of follicular lymphoma.  INTERVAL HISTORY: Patient returns to clinic today for further evaluation and consideration of cycle 7 of 12 of FOLFOX.  She continues to tolerate her treatments well without significant side effects.  She does not complain of any weakness or fatigue.  She currently feels well and is asymptomatic.  She has a good appetite and denies weight loss.  She has no neurologic complaints.  She denies any recent fevers or illnesses.  She denies any chest pain, shortness of breath, cough, or hemoptysis. She denies any nausea, vomiting, constipation, or diarrhea. She has no urinary complaints.  Patient offers no specific complaints today.  REVIEW OF SYSTEMS:   Review of Systems  Constitutional: Negative.  Negative for fever, malaise/fatigue and weight loss.  Respiratory: Negative.  Negative for cough, hemoptysis and shortness of breath.   Cardiovascular: Negative.  Negative for chest pain and leg swelling.  Gastrointestinal: Negative.  Negative for abdominal pain, blood in stool, melena, nausea and vomiting.  Genitourinary: Negative.  Negative for dysuria.  Musculoskeletal: Negative.  Negative for back pain, joint pain and neck pain.  Skin: Negative.  Negative for rash.  Neurological: Negative.  Negative for sensory change, focal weakness, weakness and headaches.  Psychiatric/Behavioral: Negative.  The patient is not  nervous/anxious and does not have insomnia.    As per HPI. Otherwise, a complete review of systems is negative.   PAST MEDICAL HISTORY: Past Medical History:  Diagnosis Date  . Arthritis   . Cataract    bilateral  . Chicken pox   . Colon polyp   . Follicular lymphoma ([place]) 08/2016   lymph nodes   . Hyperlipidemia   . Osteoporosis     PAST SURGICAL HISTORY: Past Surgical History:  Procedure Laterality Date  . AXILLARY LYMPH NODE DISSECTION Right 08/21/2016   Procedure: AXILLARY LYMPH NODE excision;  Surgeon: Leonie Green, MD;  Location: [facility];  Service: General;  Laterality: Right;  . CATARACT EXTRACTION, BILATERAL Bilateral   . COLONOSCOPY N/A 09/19/2020   Procedure: COLONOSCOPY;  Surgeon: Lesly Rubenstein, MD;  Location: [facility];  Service: Endoscopy;  Laterality: N/A;  . EYE SURGERY    . JOINT REPLACEMENT Left   . PORTA CATH INSERTION N/A 09/16/2017   Procedure: PORTA CATH INSERTION;  Surgeon: Algernon Huxley, MD;  Location: [place] [facility];  Service: Cardiovascular;  Laterality: N/A;  . TONSILLECTOMY    . TOTAL HIP ARTHROPLASTY Left 1992    FAMILY HISTORY: Family History  Problem Relation Age of Onset  . Diabetes Sister   . Lung cancer Brother   . Diabetes Brother   . Basal cell carcinoma Daughter   . Breast cancer Paternal Aunt     ADVANCED DIRECTIVES (Y/N):  N  HEALTH MAINTENANCE: Social History   Tobacco Use  . Smoking status: Never Smoker  . Smokeless tobacco: Never Used  Vaping Use  . Vaping Use: Never used  Substance Use Topics  . Alcohol use: No  . Drug use: No  Colonoscopy:  PAP:  Bone density:  Lipid panel:  No Known Allergies  Current Outpatient Medications  Medication Sig Dispense Refill  . alendronate (FOSAMAX) 70 MG tablet TAKE 1 TABLET EVERY 7 DAYS TAKE WITH A FULL GLASS OF WATER. DO NOT LIE DOWN FOR THE NEXT 30 MIN.  3  . lidocaine-prilocaine (EMLA) cream Apply to affected area once 30 g 3  . Multiple  Vitamins-Minerals (CENTRUM SILVER PO) Take 1 tablet by mouth daily.    . ondansetron (ZOFRAN) 8 MG tablet Take 1 tablet (8 mg total) by mouth 2 (two) times daily as needed for refractory nausea / vomiting. Start on day 3 after chemotherapy. 60 tablet 1  . pantoprazole (PROTONIX) 20 MG tablet Take 20 mg by mouth at bedtime.     . prochlorperazine (COMPAZINE) 10 MG tablet TAKE 1 TABLET (10 MG TOTAL) BY MOUTH EVERY 6 (SIX) HOURS AS NEEDED (NAUSEA OR VOMITING). 60 tablet 1  . simvastatin (ZOCOR) 20 MG tablet TAKE 1 TABLET BY MOUTH EVERY DAY AT NIGHT    . tizanidine (ZANAFLEX) 2 MG capsule Take 2 mg by mouth 3 (three) times daily.    . traMADol-acetaminophen (ULTRACET) 37.5-325 MG tablet Take 1 tablet by mouth every 8 (eight) hours as needed.     . diclofenac sodium (VOLTAREN) 1 % GEL APPLY 2 GRAMS TOPICALLY 2 (TWO) TIMES DAILY    . ibandronate (BONIVA) 150 MG tablet PLEASE SEE ATTACHED FOR DETAILED DIRECTIONS (Patient not taking: No sig reported)     No current facility-administered medications for this visit.   Facility-Administered Medications Ordered in Other Visits  Medication Dose Route Frequency Provider Last Rate Last Admin  . heparin lock flush 100 unit/mL  500 Units Intravenous Once Lloyd Huger, MD      . heparin lock flush 100 unit/mL  500 Units Intracatheter PRN Lloyd Huger, MD      . heparin lock flush 100 unit/mL  500 Units Intravenous Once Lloyd Huger, MD      . sodium chloride flush (NS) 0.9 % injection 10 mL  10 mL Intravenous PRN Lloyd Huger, MD   10 mL at 12/04/17 0901  . sodium chloride flush (NS) 0.9 % injection 10 mL  10 mL Intracatheter PRN Lloyd Huger, MD      . yttrium-90 injection [id number] millicurie  99991111 millicurie Intravenous Once Chrystal, Eulas Post, MD        OBJECTIVE: Vitals:   03/08/21 0847  BP: 122/63  Pulse: 89  Resp: 18  Temp: (!) 97.3 F (36.3 C)  SpO2: 97%     Body mass index is 22.17 kg/m.    ECOG FS:0 -  Asymptomatic  General: Thin, no acute distress. Eyes: Pink conjunctiva, anicteric sclera. HEENT: Normocephalic, moist mucous membranes. Lungs: No audible wheezing or coughing. Heart: Regular rate and rhythm. Abdomen: Soft, nontender, no obvious distention. Musculoskeletal: No edema, cyanosis, or clubbing. Neuro: Alert, answering all questions appropriately. Cranial nerves grossly intact. Skin: No rashes or petechiae noted. Psych: Normal affect.   LAB RESULTS:  Lab Results  Component Value Date   NA 142 03/08/2021   K 4.0 03/08/2021   CL 104 03/08/2021   CO2 27 03/08/2021   GLUCOSE 109 (H) 03/08/2021   BUN 11 03/08/2021   CREATININE 0.61 03/08/2021   CALCIUM 9.3 03/08/2021   PROT 7.0 03/08/2021   ALBUMIN 3.8 03/08/2021   AST 28 03/08/2021   ALT 15 03/08/2021   ALKPHOS 70 03/08/2021   BILITOT 0.6 03/08/2021  GFRNONAA >60 03/08/2021   GFRAA >60 06/28/2020    Lab Results  Component Value Date   WBC 4.5 03/08/2021   NEUTROABS 2.1 03/08/2021   HGB 12.6 03/08/2021   HCT 38.4 03/08/2021   MCV 103.2 (H) 03/08/2021   PLT 90 (L) 03/08/2021     STUDIES: NM PET Image Restag (PS) Skull Base To Thigh  Result Date: 03/01/2021 CLINICAL DATA:  Subsequent treatment strategy for colorectal cancer. Chemotherapy including through last week. Evaluate treatment response. Second COVID vaccine 5/21. History of lymphoma in 2019. EXAM: NUCLEAR MEDICINE PET SKULL BASE TO THIGH TECHNIQUE: 5.9 mCi F-18 FDG was injected intravenously. Full-ring PET imaging was performed from the skull base to thigh after the radiotracer. CT data was obtained and used for attenuation correction and anatomic localization. Fasting blood glucose: 93 mg/dl COMPARISON:  11/17/2020 FINDINGS: Mediastinal blood pool activity: SUV max 1.8 Liver activity: SUV max NA NECK: No areas of abnormal hypermetabolism. Incidental CT findings: No cervical adenopathy. CHEST: No thoracic nodal hypermetabolism. Right lower lobe pulmonary  nodules are again identified. The majority are below PET resolution. Example 5 mm on 100/3 versus 7 mm on the prior exam. The superior segment right lower lobe dominant nodule measures 5 mm and a S.U.V. max of 1.3 on 78/3 versus 1.0 cm and a S.U.V. max of 2.8 on the prior exam. Incidental CT findings: Emphysema. Mild fibrosis within the lateral right apex. Heart size accentuated by a pectus excavatum deformity. Right Port-A-Cath tip at high right atrium. Aortic and coronary artery calcification. ABDOMEN/PELVIS: No abdominopelvic nodal hypermetabolism. Low rectal hypermetabolism and wall thickening again identified. Example at a S.U.V. max of 6.6 on 211/3. Compare a S.U.V. max of 23.5 on the prior exam. The wall thickening is felt to be slightly decreased, again continuing for 3-4 cm span. Incidental CT findings: Normal adrenal glands. Abdominal aortic atherosclerosis. No renal calculi or hydronephrosis. Pelvic floor laxity. Degraded evaluation of the pelvis, secondary to beam hardening artifact from left hip arthroplasty. SKELETON: No abnormal marrow activity. Incidental CT findings: none IMPRESSION: 1. Response to therapy of rectal primary. 2. Decrease in size and hypermetabolism of right lower lobe pulmonary nodules. Most likely response to therapy of metastatic disease. Given distribution in a single lobe, infectious/inflammatory etiology is a secondary consideration. 3. No new sites of disease. 4. Coronary artery atherosclerosis. Aortic Atherosclerosis (ICD10-I70.0). Emphysema (ICD10-J43.9). Electronically Signed   By: Abigail Miyamoto M.D.   On: 03/01/2021 16:49     ASSESSMENT: Stage IV rectal cancer, history of follicular lymphoma. PLAN:    1.  Stage IV rectal cancer: Biopsy confirmed rectal cancer.  MRI completed at [facility] on October 11, 2020 revealed extension of the tumor beyond the wall into the rectovaginal recess with suspected invasion of the vaginal wall posteriorly.  Tumor also appears to  involve the internal anal sphincter.  There are also numerous prominent presacral and mesorectal lymph nodes highly suspicious for malignancy.  PET scan results from November 17, 2020 reviewed independently with 3 hypermetabolic right lower lobe lung nodules consistent with pulmonary metastasis.  Repeat PET scan on March 01, 2021 reviewed independently and reported as above with significant improvement of pulmonary nodules as well as known rectal primary.  Will discuss at tumor board later this week to determine if surgery is an option at the conclusion of chemotherapy.  Despite persistent thrombocytopenia, will proceed with cycle 7 of FOLFOX today.  Patient now will only receive treatment every 3 weeks.  Return to clinic in 2 days  for pump removal.  Will hold Ziextenzo for this cycle.  Return to clinic in 3 weeks for further evaluation and consideration of cycle 8.   2. Recurrent follicular lymphoma: CT scan results from December 23, 2019 reviewed independently with no obvious evidence of recurrent or progressive disease.  PET scan results as above consistent with rectal cancer metastasis and no evidence of lymphoma.   Patient received Zevalin on July 30, 2017 with not much therapeutic effect.  She subsequently underwent cycles 5 of R-CHOP chemotherapy with Neulasta support completing on December 12, 2017.  Patient continues to be in complete remission.  3.  Hypokalemia: Resolved. 4.  Neutropenia: Resolved.  Hold Ziextenzo for the cycle. 5.  Thrombocytopenia: Chronic and unchanged.  Proceed with treatment as above.  Patient will now receive treatment every 3 weeks.   6.  Covid infection: Resolved.  Patient is now asymptomatic.  Patient expressed understanding and was in agreement with this plan. She also understands that She can call clinic at any time with any questions, concerns, or complaints.   Cancer Staging 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 [facility]) Staging form:  Lymphoid Neoplasms, AJCC 6th Edition - Clinical stage from 08/27/2016: Stage IIE (lymphoma only) - Signed by Lloyd Huger, MD on 08/27/2016  Rectal cancer [facility]) Staging form: Colon and Rectum, AJCC 8th Edition - Clinical: Stage IVA Veronica Boyd, Veronica Boyd) - Signed by Lloyd Huger, MD on 11/02/2020   Lloyd Huger, MD   03/09/2021 9:45 AM

## 2021-03-08 ENCOUNTER — Inpatient Hospital Stay: Payer: Medicare Other | Attending: Internal Medicine

## 2021-03-08 ENCOUNTER — Other Ambulatory Visit: Payer: Self-pay

## 2021-03-08 ENCOUNTER — Inpatient Hospital Stay (HOSPITAL_BASED_OUTPATIENT_CLINIC_OR_DEPARTMENT_OTHER): Payer: Medicare Other | Admitting: Oncology

## 2021-03-08 ENCOUNTER — Encounter: Payer: Self-pay | Admitting: Oncology

## 2021-03-08 ENCOUNTER — Inpatient Hospital Stay: Payer: Medicare Other

## 2021-03-08 VITALS — BP 122/63 | HR 89 | Temp 97.3°F | Resp 18 | Ht <= 58 in | Wt 106.1 lb

## 2021-03-08 DIAGNOSIS — C7801 Secondary malignant neoplasm of right lung: Secondary | ICD-10-CM | POA: Diagnosis present

## 2021-03-08 DIAGNOSIS — Z452 Encounter for adjustment and management of vascular access device: Secondary | ICD-10-CM | POA: Diagnosis not present

## 2021-03-08 DIAGNOSIS — C20 Malignant neoplasm of rectum: Secondary | ICD-10-CM | POA: Insufficient documentation

## 2021-03-08 DIAGNOSIS — Z5111 Encounter for antineoplastic chemotherapy: Secondary | ICD-10-CM | POA: Diagnosis present

## 2021-03-08 LAB — CBC WITH DIFFERENTIAL/PLATELET
Abs Immature Granulocytes: 0.02 10*3/uL (ref 0.00–0.07)
Basophils Absolute: 0 10*3/uL (ref 0.0–0.1)
Basophils Relative: 1 %
Eosinophils Absolute: 0 10*3/uL (ref 0.0–0.5)
Eosinophils Relative: 0 %
HCT: 38.4 % (ref 36.0–46.0)
Hemoglobin: 12.6 g/dL (ref 12.0–15.0)
Immature Granulocytes: 0 %
Lymphocytes Relative: 27 %
Lymphs Abs: 1.2 10*3/uL (ref 0.7–4.0)
MCH: 33.9 pg (ref 26.0–34.0)
MCHC: 32.8 g/dL (ref 30.0–36.0)
MCV: 103.2 fL — ABNORMAL HIGH (ref 80.0–100.0)
Monocytes Absolute: 1.1 10*3/uL — ABNORMAL HIGH (ref 0.1–1.0)
Monocytes Relative: 24 %
Neutro Abs: 2.1 10*3/uL (ref 1.7–7.7)
Neutrophils Relative %: 48 %
Platelets: 90 10*3/uL — ABNORMAL LOW (ref 150–400)
RBC: 3.72 MIL/uL — ABNORMAL LOW (ref 3.87–5.11)
RDW: 14.9 % (ref 11.5–15.5)
WBC: 4.5 10*3/uL (ref 4.0–10.5)
nRBC: 0 % (ref 0.0–0.2)

## 2021-03-08 LAB — COMPREHENSIVE METABOLIC PANEL
ALT: 15 U/L (ref 0–44)
AST: 28 U/L (ref 15–41)
Albumin: 3.8 g/dL (ref 3.5–5.0)
Alkaline Phosphatase: 70 U/L (ref 38–126)
Anion gap: 11 (ref 5–15)
BUN: 11 mg/dL (ref 8–23)
CO2: 27 mmol/L (ref 22–32)
Calcium: 9.3 mg/dL (ref 8.9–10.3)
Chloride: 104 mmol/L (ref 98–111)
Creatinine, Ser: 0.61 mg/dL (ref 0.44–1.00)
GFR, Estimated: >60 mL/min (ref >60)
Glucose, Bld: 109 mg/dL — ABNORMAL HIGH (ref 70–99)
Potassium: 4 mmol/L (ref 3.5–5.1)
Sodium: 142 mmol/L (ref 135–145)
Total Bilirubin: 0.6 mg/dL (ref 0.3–1.2)
Total Protein: 7 g/dL (ref 6.5–8.1)

## 2021-03-08 MED ORDER — OXALIPLATIN CHEMO INJECTION 100 MG/20ML
85.0000 mg/m2 | Freq: Once | INTRAVENOUS | Status: AC
  Administered 2021-03-08: 125 mg via INTRAVENOUS
  Filled 2021-03-08: qty 20

## 2021-03-08 MED ORDER — LEUCOVORIN CALCIUM INJECTION 350 MG
411.0000 mg/m2 | Freq: Once | INTRAVENOUS | Status: AC
  Administered 2021-03-08: 600 mg via INTRAVENOUS
  Filled 2021-03-08: qty 30

## 2021-03-08 MED ORDER — PALONOSETRON HCL INJECTION 0.25 MG/5ML
0.2500 mg | Freq: Once | INTRAVENOUS | Status: AC
Start: 2021-03-08 — End: 2021-03-08
  Administered 2021-03-08: 0.25 mg via INTRAVENOUS
  Filled 2021-03-08: qty 5

## 2021-03-08 MED ORDER — FLUOROURACIL CHEMO INJECTION 2.5 GM/50ML
400.0000 mg/m2 | Freq: Once | INTRAVENOUS | Status: AC
  Administered 2021-03-08: 600 mg via INTRAVENOUS
  Filled 2021-03-08: qty 12

## 2021-03-08 MED ORDER — ONDANSETRON HCL 4 MG PO TABS: 8.0000 mg | ORAL_TABLET | Freq: Once | ORAL | Status: DC

## 2021-03-08 MED ORDER — DEXTROSE 5 % IV SOLN
Freq: Once | INTRAVENOUS | Status: AC
  Filled 2021-03-08: qty 250

## 2021-03-08 MED ORDER — SODIUM CHLORIDE 0.9 % IV SOLN
2400.0000 mg/m2 | INTRAVENOUS | Status: DC
  Administered 2021-03-08: 3500 mg via INTRAVENOUS
  Filled 2021-03-08: qty 70

## 2021-03-08 MED ORDER — SODIUM CHLORIDE 0.9 % IV SOLN
10.0000 mg | Freq: Once | INTRAVENOUS | Status: AC
  Administered 2021-03-08: 10 mg via INTRAVENOUS
  Filled 2021-03-08: qty 10

## 2021-03-08 NOTE — Patient Instructions (Signed)
[facility]  Discharge Instructions: Thank you for choosing [facility] to provide your oncology and hematology care.  If you have a lab appointment with the Cancer Center, please go directly to the Cancer Center and check in at the registration area.  Wear comfortable clothing and clothing appropriate for easy access to any Portacath or PICC line.   We strive to give you quality time with your provider. You may need to reschedule your appointment if you arrive late (15 or more minutes).  Arriving late affects you and other patients whose appointments are after yours.  Also, if you miss three or more appointments without notifying the office, you may be dismissed from the clinic at the provider's discretion.      For prescription refill requests, have your pharmacy contact our office and allow 72 hours for refills to be completed.    Today you received the following chemotherapy and/or immunotherapy agents - oxaliplatin, leucovorin, 5-FU      To help prevent nausea and vomiting after your treatment, we encourage you to take your nausea medication as directed.  BELOW ARE SYMPTOMS THAT SHOULD BE REPORTED IMMEDIATELY: . *FEVER GREATER THAN 100.4 F (38 C) OR HIGHER . *CHILLS OR SWEATING . *NAUSEA AND VOMITING THAT IS NOT CONTROLLED WITH YOUR NAUSEA MEDICATION . *UNUSUAL SHORTNESS OF BREATH . *UNUSUAL BRUISING OR BLEEDING . *URINARY PROBLEMS (pain or burning when urinating, or frequent urination) . *BOWEL PROBLEMS (unusual diarrhea, constipation, pain near the anus) . TENDERNESS IN MOUTH AND THROAT WITH OR WITHOUT PRESENCE OF ULCERS (sore throat, sores in mouth, or a toothache) . UNUSUAL RASH, SWELLING OR PAIN  . UNUSUAL VAGINAL DISCHARGE OR ITCHING   Items with * indicate a potential emergency and should be followed up as soon as possible or go to the Emergency Department if any problems should occur.  Please show the CHEMOTHERAPY ALERT CARD  or IMMUNOTHERAPY ALERT CARD at check-in to the Emergency Department and triage nurse.  Should you have questions after your visit or need to cancel or reschedule your appointment, please contact [facility]  [phone] and follow the prompts.  Office hours are 8:00 a.m. to 4:30 p.m. Monday - Friday. Please note that voicemails left after 4:00 p.m. may not be returned until the following business day.  We are closed weekends and major holidays. You have access to a nurse at all times for urgent questions. Please call the main number to the clinic [phone] and follow the prompts.  For any non-urgent questions, you may also contact your provider using MyChart. We now offer e-Visits for anyone 18 and older to request care online for non-urgent symptoms. For details visit [email or web address].   Also download the MyChart app! Go to the app store, search "MyChart", open the app, select [facility], and log in with your MyChart username and password.  Due to Covid, a mask is required upon entering the hospital/clinic. If you do not have a mask, one will be given to you upon arrival. For doctor visits, patients may have 1 support person aged 18 or older with them. For treatment visits, patients cannot have anyone with them due to current Covid guidelines and our immunocompromised population.   Oxaliplatin Injection What is this medicine? OXALIPLATIN (ox AL i PLA tin) is a chemotherapy drug. It targets fast dividing cells, like cancer cells, and causes these cells to die. This medicine is used to treat cancers of the colon and rectum,   many other cancers. This medicine may be used for other purposes; ask your health care provider or pharmacist if you have questions. COMMON BRAND NAME(S): Eloxatin What should I tell my health care provider before I take this medicine? They need to know if you have any of these conditions:  heart disease  history of irregular  heartbeat  liver disease  low blood counts, like white cells, platelets, or red blood cells  lung or breathing disease, like asthma  take medicines that treat or prevent blood clots  tingling of the fingers or toes, or other nerve disorder  an unusual or allergic reaction to oxaliplatin, other chemotherapy, other medicines, foods, dyes, or preservatives  pregnant or trying to get pregnant  breast-feeding How should I use this medicine? This drug is given as an infusion into a vein. It is administered in a hospital or clinic by a specially trained health care professional. Talk to your pediatrician regarding the use of this medicine in children. Special care may be needed. Overdosage: If you think you have taken too much of this medicine contact a poison control center or emergency room at once. NOTE: This medicine is only for you. Do not share this medicine with others. What if I miss a dose? It is important not to miss a dose. Call your doctor or health care professional if you are unable to keep an appointment. What may interact with this medicine? Do not take this medicine with any of the following medications:  cisapride  dronedarone  pimozide  thioridazine This medicine may also interact with the following medications:  aspirin and aspirin-like medicines  certain medicines that treat or prevent blood clots like warfarin, apixaban, dabigatran, and rivaroxaban  cisplatin  cyclosporine  diuretics  medicines for infection like acyclovir, adefovir, amphotericin B, bacitracin, cidofovir, foscarnet, ganciclovir, gentamicin, pentamidine, vancomycin  NSAIDs, medicines for pain and inflammation, like ibuprofen or naproxen  other medicines that prolong the QT interval (an abnormal heart rhythm)  pamidronate  zoledronic acid This list may not describe all possible interactions. Give your health care provider a list of all the medicines, herbs, non-prescription drugs,  or dietary supplements you use. Also tell them if you smoke, drink alcohol, or use illegal drugs. Some items may interact with your medicine. What should I watch for while using this medicine? Your condition will be monitored carefully while you are receiving this medicine. You may need blood work done while you are taking this medicine. This medicine may make you feel generally unwell. This is not uncommon as chemotherapy can affect healthy cells as well as cancer cells. Report any side effects. Continue your course of treatment even though you feel ill unless your healthcare professional tells you to stop. This medicine can make you more sensitive to cold. Do not drink cold drinks or use ice. Cover exposed skin before coming in contact with cold temperatures or cold objects. When out in cold weather wear warm clothing and cover your mouth and nose to warm the air that goes into your lungs. Tell your doctor if you get sensitive to the cold. Do not become pregnant while taking this medicine or for 9 months after stopping it. Women should inform their health care professional if they wish to become pregnant or think they might be pregnant. Men should not father a child while taking this medicine and for 6 months after stopping it. There is potential for serious side effects to an unborn child. Talk to your health care professional for  for more information. Do not breast-feed a child while taking this medicine or for 3 months after stopping it. This medicine has caused ovarian failure in some women. This medicine may make it more difficult to get pregnant. Talk to your health care professional if you are concerned about your fertility. This medicine has caused decreased sperm counts in some men. This may make it more difficult to father a child. Talk to your health care professional if you are concerned about your fertility. This medicine may increase your risk of getting an infection. Call your health care  professional for advice if you get a fever, chills, or sore throat, or other symptoms of a cold or flu. Do not treat yourself. Try to avoid being around people who are sick. Avoid taking medicines that contain aspirin, acetaminophen, ibuprofen, naproxen, or ketoprofen unless instructed by your health care professional. These medicines may hide a fever. Be careful brushing or flossing your teeth or using a toothpick because you may get an infection or bleed more easily. If you have any dental work done, tell your dentist you are receiving this medicine. What side effects may I notice from receiving this medicine? Side effects that you should report to your doctor or health care professional as soon as possible:  allergic reactions like skin rash, itching or hives, swelling of the face, lips, or tongue  breathing problems  cough  low blood counts - this medicine may decrease the number of white blood cells, red blood cells, and platelets. You may be at increased risk for infections and bleeding  nausea, vomiting  pain, redness, or irritation at site where injected  pain, tingling, numbness in the hands or feet  signs and symptoms of bleeding such as bloody or black, tarry stools; red or dark brown urine; spitting up blood or brown material that looks like coffee grounds; red spots on the skin; unusual bruising or bleeding from the eyes, gums, or nose  signs and symptoms of a dangerous change in heartbeat or heart rhythm like chest pain; dizziness; fast, irregular heartbeat; palpitations; feeling faint or lightheaded; falls  signs and symptoms of infection like fever; chills; cough; sore throat; pain or trouble passing urine  signs and symptoms of liver injury like dark yellow or brown urine; general ill feeling or flu-like symptoms; light-colored stools; loss of appetite; nausea; right upper belly pain; unusually weak or tired; yellowing of the eyes or skin  signs and symptoms of low red  blood cells or anemia such as unusually weak or tired; feeling faint or lightheaded; falls  signs and symptoms of muscle injury like dark urine; trouble passing urine or change in the amount of urine; unusually weak or tired; muscle pain; back pain Side effects that usually do not require medical attention (report to your doctor or health care professional if they continue or are bothersome):  changes in taste  diarrhea  gas  hair loss  loss of appetite  mouth sores This list may not describe all possible side effects. Call your doctor for medical advice about side effects. You may report side effects to FDA at 1-800-FDA-1088. Where should I keep my medicine? This drug is given in a hospital or clinic and will not be stored at home. NOTE: This sheet is a summary. It may not cover all possible information. If you have questions about this medicine, talk to your doctor, pharmacist, or health care provider.  2021 Elsevier/Gold Standard (2019-03-11 12:20:35)  Leucovorin injection What is this medicine?   LEUCOVORIN (loo koe VOR in) is used to prevent or treat the harmful effects of some medicines. This medicine is used to treat anemia caused by a low amount of folic acid in the body. It is also used with 5-fluorouracil (5-FU) to treat colon cancer. This medicine may be used for other purposes; ask your health care provider or pharmacist if you have questions. What should I tell my health care provider before I take this medicine? They need to know if you have any of these conditions:  anemia from low levels of vitamin B-12 in the blood  an unusual or allergic reaction to leucovorin, folic acid, other medicines, foods, dyes, or preservatives  pregnant or trying to get pregnant  breast-feeding How should I use this medicine? This medicine is for injection into a muscle or into a vein. It is given by a health care professional in a hospital or clinic setting. Talk to your pediatrician  regarding the use of this medicine in children. Special care may be needed. Overdosage: If you think you have taken too much of this medicine contact a poison control center or emergency room at once. NOTE: This medicine is only for you. Do not share this medicine with others. What if I miss a dose? This does not apply. What may interact with this medicine?  capecitabine  fluorouracil  phenobarbital  phenytoin  primidone  trimethoprim-sulfamethoxazole This list may not describe all possible interactions. Give your health care provider a list of all the medicines, herbs, non-prescription drugs, or dietary supplements you use. Also tell them if you smoke, drink alcohol, or use illegal drugs. Some items may interact with your medicine. What should I watch for while using this medicine? Your condition will be monitored carefully while you are receiving this medicine. This medicine may increase the side effects of 5-fluorouracil, 5-FU. Tell your doctor or health care professional if you have diarrhea or mouth sores that do not get better or that get worse. What side effects may I notice from receiving this medicine? Side effects that you should report to your doctor or health care professional as soon as possible:  allergic reactions like skin rash, itching or hives, swelling of the face, lips, or tongue  breathing problems  fever, infection  mouth sores  unusual bleeding or bruising  unusually weak or tired Side effects that usually do not require medical attention (report to your doctor or health care professional if they continue or are bothersome):  constipation or diarrhea  loss of appetite  nausea, vomiting This list may not describe all possible side effects. Call your doctor for medical advice about side effects. You may report side effects to FDA at 1-800-FDA-1088. Where should I keep my medicine? This drug is given in a hospital or clinic and will not be stored at  home. NOTE: This sheet is a summary. It may not cover all possible information. If you have questions about this medicine, talk to your doctor, pharmacist, or health care provider.  2021 Elsevier/Gold Standard (2008-04-27 16:50:29)  Fluorouracil, 5-FU injection What is this medicine? FLUOROURACIL, 5-FU (flure oh YOOR a sil) is a chemotherapy drug. It slows the growth of cancer cells. This medicine is used to treat many types of cancer like breast cancer, colon or rectal cancer, pancreatic cancer, and stomach cancer. This medicine may be used for other purposes; ask your health care provider or pharmacist if you have questions. COMMON BRAND NAME(S): Adrucil What should I tell my health care provider before   I take this medicine? They need to know if you have any of these conditions:  blood disorders  dihydropyrimidine dehydrogenase (DPD) deficiency  infection (especially a virus infection such as chickenpox, cold sores, or herpes)  kidney disease  liver disease  malnourished, poor nutrition  recent or ongoing radiation therapy  an unusual or allergic reaction to fluorouracil, other chemotherapy, other medicines, foods, dyes, or preservatives  pregnant or trying to get pregnant  breast-feeding How should I use this medicine? This drug is given as an infusion or injection into a vein. It is administered in a hospital or clinic by a specially trained health care professional. Talk to your pediatrician regarding the use of this medicine in children. Special care may be needed. Overdosage: If you think you have taken too much of this medicine contact a poison control center or emergency room at once. NOTE: This medicine is only for you. Do not share this medicine with others. What if I miss a dose? It is important not to miss your dose. Call your doctor or health care professional if you are unable to keep an appointment. What may interact with this medicine? Do not take this  medicine with any of the following medications:  live virus vaccines This medicine may also interact with the following medications:  medicines that treat or prevent blood clots like warfarin, enoxaparin, and dalteparin This list may not describe all possible interactions. Give your health care provider a list of all the medicines, herbs, non-prescription drugs, or dietary supplements you use. Also tell them if you smoke, drink alcohol, or use illegal drugs. Some items may interact with your medicine. What should I watch for while using this medicine? Visit your doctor for checks on your progress. This drug may make you feel generally unwell. This is not uncommon, as chemotherapy can affect healthy cells as well as cancer cells. Report any side effects. Continue your course of treatment even though you feel ill unless your doctor tells you to stop. In some cases, you may be given additional medicines to help with side effects. Follow all directions for their use. Call your doctor or health care professional for advice if you get a fever, chills or sore throat, or other symptoms of a cold or flu. Do not treat yourself. This drug decreases your body's ability to fight infections. Try to avoid being around people who are sick. This medicine may increase your risk to bruise or bleed. Call your doctor or health care professional if you notice any unusual bleeding. Be careful brushing and flossing your teeth or using a toothpick because you may get an infection or bleed more easily. If you have any dental work done, tell your dentist you are receiving this medicine. Avoid taking products that contain aspirin, acetaminophen, ibuprofen, naproxen, or ketoprofen unless instructed by your doctor. These medicines may hide a fever. Do not become pregnant while taking this medicine. Women should inform their doctor if they wish to become pregnant or think they might be pregnant. There is a potential for serious side  effects to an unborn child. Talk to your health care professional or pharmacist for more information. Do not breast-feed an infant while taking this medicine. Men should inform their doctor if they wish to father a child. This medicine may lower sperm counts. Do not treat diarrhea with over the counter products. Contact your doctor if you have diarrhea that lasts more than 2 days or if it is severe and watery. This medicine can   make you more sensitive to the sun. Keep out of the sun. If you cannot avoid being in the sun, wear protective clothing and use sunscreen. Do not use sun lamps or tanning beds/booths. What side effects may I notice from receiving this medicine? Side effects that you should report to your doctor or health care professional as soon as possible:  allergic reactions like skin rash, itching or hives, swelling of the face, lips, or tongue  low blood counts - this medicine may decrease the number of white blood cells, red blood cells and platelets. You may be at increased risk for infections and bleeding.  signs of infection - fever or chills, cough, sore throat, pain or difficulty passing urine  signs of decreased platelets or bleeding - bruising, pinpoint red spots on the skin, black, tarry stools, blood in the urine  signs of decreased red blood cells - unusually weak or tired, fainting spells, lightheadedness  breathing problems  changes in vision  chest pain  mouth sores  nausea and vomiting  pain, swelling, redness at site where injected  pain, tingling, numbness in the hands or feet  redness, swelling, or sores on hands or feet  stomach pain  unusual bleeding Side effects that usually do not require medical attention (report to your doctor or health care professional if they continue or are bothersome):  changes in finger or toe nails  diarrhea  dry or itchy skin  hair loss  headache  loss of appetite  sensitivity of eyes to the  light  stomach upset  unusually teary eyes This list may not describe all possible side effects. Call your doctor for medical advice about side effects. You may report side effects to FDA at 1-800-FDA-1088. Where should I keep my medicine? This drug is given in a hospital or clinic and will not be stored at home. NOTE: This sheet is a summary. It may not cover all possible information. If you have questions about this medicine, talk to your doctor, pharmacist, or health care provider.  2021 Elsevier/Gold Standard (2019-09-22 15:00:03)  

## 2021-03-08 NOTE — Progress Notes (Signed)
Pt tolerated all infusions well with no problems or complaints.  Pt left infusion suite stable and ambulatory with her 5FU pump infusing as ordered.

## 2021-03-09 ENCOUNTER — Other Ambulatory Visit: Payer: Medicare Other

## 2021-03-09 NOTE — Progress Notes (Signed)
Tumor Board Documentation  Veronica Boyd was presented by Dr Grayland Ormond at our Tumor Board on 03/09/2021, which included representatives from medical oncology,radiation oncology,surgical oncology,internal medicine,navigation,pathology,radiology,surgical,pharmacy,genetics,research,palliative care,pulmonology.  Veronica Boyd currently presents for discussion,as a current patient with history of the following treatments: active survellience,neoadjuvant chemotherapy,neoadjuvant radiation.  Additionally, we reviewed previous medical and familial history, history of present illness, and recent lab results along with all available histopathologic and imaging studies. The tumor board considered available treatment options and made the following recommendations: Surgery (Possible AP resection) Chemo/Radiation therapy versus continuation of  FOLFOX chemotherapy  The following procedures/referrals were also placed: No orders of the defined types were placed in this encounter.   Clinical Trial Status: not discussed   Staging used: AJCC Stage Group  AJCC Staging: T: 4 N: 2 M: 1a Group: Stage IV Rectal Cancer   National site-specific guidelines NCCN were discussed with respect to the case.  Tumor board is a meeting of clinicians from various specialty areas who evaluate and discuss patients for whom a multidisciplinary approach is being considered. Final determinations in the plan of care are those of the provider(s). The responsibility for follow up of recommendations given during tumor board is that of the provider.   Today's extended care, comprehensive team conference, Veronica Boyd was not present for the discussion and was not examined.   Multidisciplinary Tumor Board is a multidisciplinary case peer review process.  Decisions discussed in the Multidisciplinary Tumor Board reflect the opinions of the specialists present at the conference without having examined the patient.  Ultimately, treatment and  diagnostic decisions rest with the primary provider(s) and the patient.

## 2021-03-10 ENCOUNTER — Inpatient Hospital Stay: Payer: Medicare Other

## 2021-03-10 VITALS — BP 122/71 | HR 87 | Temp 98.4°F | Resp 18

## 2021-03-10 DIAGNOSIS — C20 Malignant neoplasm of rectum: Secondary | ICD-10-CM

## 2021-03-10 DIAGNOSIS — Z5111 Encounter for antineoplastic chemotherapy: Secondary | ICD-10-CM | POA: Diagnosis not present

## 2021-03-10 MED ORDER — SODIUM CHLORIDE 0.9% FLUSH
10.0000 mL | INTRAVENOUS | Status: DC | PRN
  Administered 2021-03-10: 10 mL
  Filled 2021-03-10: qty 10

## 2021-03-10 MED ORDER — HEPARIN SOD (PORK) LOCK FLUSH 100 UNIT/ML IV SOLN
500.0000 [IU] | Freq: Once | INTRAVENOUS | Status: AC | PRN
  Administered 2021-03-10: 500 [IU]
  Filled 2021-03-10: qty 5

## 2021-03-23 NOTE — Progress Notes (Signed)
[place]  Telephone:(336) [phone] Fax:(336) [phone]  ID: Veronica Boyd OB: 1946-05-10  MR#: [id number]  YJE#:[id number]  Patient Care Team: Tracie Harrier, MD as PCP - General (Internal Medicine) Lloyd Huger, MD as Consulting Physician (Oncology) Leonie Green, MD as Referring Physician (Surgery) [name], MD as Referring Physician (Radiation Oncology) Jacquelin Hawking, NP as Nurse Practitioner (Oncology) Clent Jacks, RN as Oncology Nurse Navigator   CHIEF COMPLAINT: Stage IV rectal cancer, history of follicular lymphoma.  INTERVAL HISTORY: Patient returns to clinic today for further evaluation and consideration of cycle 8 of FOLFOX.  She currently feels well and is asymptomatic.  She does not complain of any weakness or fatigue.  She continues to tolerate her treatments well without significant side effects. She has a good appetite and denies weight loss.  She has no neurologic complaints.  She denies any recent fevers or illnesses.  She denies any chest pain, shortness of breath, cough, or hemoptysis. She denies any nausea, vomiting, constipation, or diarrhea. She has no urinary complaints.  Patient offers no specific complaints today.  REVIEW OF SYSTEMS:   Review of Systems  Constitutional: Negative.  Negative for fever, malaise/fatigue and weight loss.  Respiratory: Negative.  Negative for cough, hemoptysis and shortness of breath.   Cardiovascular: Negative.  Negative for chest pain and leg swelling.  Gastrointestinal: Negative.  Negative for abdominal pain, blood in stool, melena, nausea and vomiting.  Genitourinary: Negative.  Negative for dysuria.  Musculoskeletal: Negative.  Negative for back pain, joint pain and neck pain.  Skin: Negative.  Negative for rash.  Neurological: Negative.  Negative for sensory change, focal weakness, weakness and headaches.  Psychiatric/Behavioral: Negative.  The patient is not  nervous/anxious and does not have insomnia.    As per HPI. Otherwise, a complete review of systems is negative.   PAST MEDICAL HISTORY: Past Medical History:  Diagnosis Date  . Arthritis   . Cataract    bilateral  . Chicken pox   . Colon polyp   . Follicular lymphoma ([place]) 08/2016   lymph nodes   . Hyperlipidemia   . Osteoporosis     PAST SURGICAL HISTORY: Past Surgical History:  Procedure Laterality Date  . AXILLARY LYMPH NODE DISSECTION Right 08/21/2016   Procedure: AXILLARY LYMPH NODE excision;  Surgeon: Leonie Green, MD;  Location: [facility] ORS;  Service: General;  Laterality: Right;  . CATARACT EXTRACTION, BILATERAL Bilateral   . COLONOSCOPY N/A 09/19/2020   Procedure: COLONOSCOPY;  Surgeon: Lesly Rubenstein, MD;  Location: [facility] ENDOSCOPY;  Service: Endoscopy;  Laterality: N/A;  . EYE SURGERY    . JOINT REPLACEMENT Left   . PORTA CATH INSERTION N/A 09/16/2017   Procedure: PORTA CATH INSERTION;  Surgeon: Algernon Huxley, MD;  Location: [facility];  Service: Cardiovascular;  Laterality: N/A;  . TONSILLECTOMY    . TOTAL HIP ARTHROPLASTY Left 1992    FAMILY HISTORY: Family History  Problem Relation Age of Onset  . Diabetes Sister   . Lung cancer Brother   . Diabetes Brother   . Basal cell carcinoma Daughter   . Breast cancer Paternal Aunt     ADVANCED DIRECTIVES (Y/N):  N  HEALTH MAINTENANCE: Social History   Tobacco Use  . Smoking status: Never Smoker  . Smokeless tobacco: Never Used  Vaping Use  . Vaping Use: Never used  Substance Use Topics  . Alcohol use: No  . Drug use: No     Colonoscopy:  PAP:  Bone density:  Lipid panel:  No Known Allergies  Current Outpatient Medications  Medication Sig Dispense Refill  . alendronate (FOSAMAX) 70 MG tablet TAKE 1 TABLET EVERY 7 DAYS TAKE WITH A FULL GLASS OF WATER. DO NOT LIE DOWN FOR THE NEXT 30 MIN.  3  . diclofenac sodium (VOLTAREN) 1 % GEL APPLY 2 GRAMS TOPICALLY 2 (TWO) TIMES DAILY     . ibandronate (BONIVA) 150 MG tablet PLEASE SEE ATTACHED FOR DETAILED DIRECTIONS    . lidocaine-prilocaine (EMLA) cream Apply to affected area once 30 g 3  . Multiple Vitamins-Minerals (CENTRUM SILVER PO) Take 1 tablet by mouth daily.    . ondansetron (ZOFRAN) 8 MG tablet Take 1 tablet (8 mg total) by mouth 2 (two) times daily as needed for refractory nausea / vomiting. Start on day 3 after chemotherapy. 60 tablet 1  . pantoprazole (PROTONIX) 20 MG tablet Take 20 mg by mouth at bedtime.     . prochlorperazine (COMPAZINE) 10 MG tablet TAKE 1 TABLET (10 MG TOTAL) BY MOUTH EVERY 6 (SIX) HOURS AS NEEDED (NAUSEA OR VOMITING). 60 tablet 1  . simvastatin (ZOCOR) 20 MG tablet TAKE 1 TABLET BY MOUTH EVERY DAY AT NIGHT    . tizanidine (ZANAFLEX) 2 MG capsule Take 2 mg by mouth 3 (three) times daily.    . traMADol-acetaminophen (ULTRACET) 37.5-325 MG tablet Take 1 tablet by mouth every 8 (eight) hours as needed.      No current facility-administered medications for this visit.   Facility-Administered Medications Ordered in Other Visits  Medication Dose Route Frequency Provider Last Rate Last Admin  . fluorouracil (ADRUCIL) 3,500 mg in sodium chloride 0.9 % 80 mL chemo infusion  2,400 mg/m2 (Treatment Plan Recorded) Intravenous 1 day or 1 dose Lloyd Huger, MD   3,500 mg at 03/29/21 1223  . heparin lock flush 100 unit/mL  500 Units Intravenous Once Lloyd Huger, MD      . heparin lock flush 100 unit/mL  500 Units Intracatheter PRN Lloyd Huger, MD      . heparin lock flush 100 unit/mL  500 Units Intravenous Once Lloyd Huger, MD      . heparin lock flush 100 unit/mL  500 Units Intravenous Once Lloyd Huger, MD      . sodium chloride flush (NS) 0.9 % injection 10 mL  10 mL Intravenous PRN Lloyd Huger, MD   10 mL at 12/04/17 0901  . sodium chloride flush (NS) 0.9 % injection 10 mL  10 mL Intracatheter PRN Lloyd Huger, MD      . sodium chloride flush (NS)  0.9 % injection 10 mL  10 mL Intravenous PRN Lloyd Huger, MD   10 mL at 03/29/21 0813  . yttrium-90 injection [id number] millicurie  99991111 millicurie Intravenous Once [name], MD        OBJECTIVE: Vitals:   03/29/21 0831  BP: 108/63  Pulse: 91  Resp: 18  Temp: (!) 97.3 F (36.3 C)  SpO2: 98%     Body mass index is 22.17 kg/m.    ECOG FS:0 - Asymptomatic  General: Thin, no acute distress. Eyes: Pink conjunctiva, anicteric sclera. HEENT: Normocephalic, moist mucous membranes. Lungs: No audible wheezing or coughing. Heart: Regular rate and rhythm. Abdomen: Soft, nontender, no obvious distention. Musculoskeletal: No edema, cyanosis, or clubbing. Neuro: Alert, answering all questions appropriately. Cranial nerves grossly intact. Skin: No rashes or petechiae noted. Psych: Normal affect.   LAB RESULTS:  Lab  Results  Component Value Date   NA 138 03/29/2021   K 4.0 03/29/2021   CL 101 03/29/2021   CO2 25 03/29/2021   GLUCOSE 104 (H) 03/29/2021   BUN 10 03/29/2021   CREATININE 0.60 03/29/2021   CALCIUM 9.4 03/29/2021   PROT 7.1 03/29/2021   ALBUMIN 4.1 03/29/2021   AST 31 03/29/2021   ALT 19 03/29/2021   ALKPHOS 71 03/29/2021   BILITOT 0.5 03/29/2021   GFRNONAA >60 03/29/2021   GFRAA >60 06/28/2020    Lab Results  Component Value Date   WBC 3.9 (L) 03/29/2021   NEUTROABS 1.3 (L) 03/29/2021   HGB 12.7 03/29/2021   HCT 38.9 03/29/2021   MCV 102.1 (H) 03/29/2021   PLT 81 (L) 03/29/2021     STUDIES: NM PET Image Restag (PS) Skull Base To Thigh  Result Date: 03/01/2021 CLINICAL DATA:  Subsequent treatment strategy for colorectal cancer. Chemotherapy including through last week. Evaluate treatment response. Second COVID vaccine 5/21. History of lymphoma in 2019. EXAM: NUCLEAR MEDICINE PET SKULL BASE TO THIGH TECHNIQUE: 5.9 mCi F-18 FDG was injected intravenously. Full-ring PET imaging was performed from the skull base to thigh after the radiotracer. CT data  was obtained and used for attenuation correction and anatomic localization. Fasting blood glucose: 93 mg/dl COMPARISON:  11/17/2020 FINDINGS: Mediastinal blood pool activity: SUV max 1.8 Liver activity: SUV max NA NECK: No areas of abnormal hypermetabolism. Incidental CT findings: No cervical adenopathy. CHEST: No thoracic nodal hypermetabolism. Right lower lobe pulmonary nodules are again identified. The majority are below PET resolution. Example 5 mm on 100/3 versus 7 mm on the prior exam. The superior segment right lower lobe dominant nodule measures 5 mm and a S.U.V. max of 1.3 on 78/3 versus 1.0 cm and a S.U.V. max of 2.8 on the prior exam. Incidental CT findings: Emphysema. Mild fibrosis within the lateral right apex. Heart size accentuated by a pectus excavatum deformity. Right Port-A-Cath tip at high right atrium. Aortic and coronary artery calcification. ABDOMEN/PELVIS: No abdominopelvic nodal hypermetabolism. Low rectal hypermetabolism and wall thickening again identified. Example at a S.U.V. max of 6.6 on 211/3. Compare a S.U.V. max of 23.5 on the prior exam. The wall thickening is felt to be slightly decreased, again continuing for 3-4 cm span. Incidental CT findings: Normal adrenal glands. Abdominal aortic atherosclerosis. No renal calculi or hydronephrosis. Pelvic floor laxity. Degraded evaluation of the pelvis, secondary to beam hardening artifact from left hip arthroplasty. SKELETON: No abnormal marrow activity. Incidental CT findings: none IMPRESSION: 1. Response to therapy of rectal primary. 2. Decrease in size and hypermetabolism of right lower lobe pulmonary nodules. Most likely response to therapy of metastatic disease. Given distribution in a single lobe, infectious/inflammatory etiology is a secondary consideration. 3. No new sites of disease. 4. Coronary artery atherosclerosis. Aortic Atherosclerosis (ICD10-I70.0). Emphysema (ICD10-J43.9). Electronically Signed   By: Abigail Miyamoto M.D.   On:  03/01/2021 16:49     ASSESSMENT: Stage IV rectal cancer, history of follicular lymphoma. PLAN:    1.  Stage IV rectal cancer: Biopsy confirmed rectal cancer.  MRI completed at [facility] on October 11, 2020 revealed extension of the tumor beyond the wall into the rectovaginal recess with suspected invasion of the vaginal wall posteriorly.  Tumor also appears to involve the internal anal sphincter.  There are also numerous prominent presacral and mesorectal lymph nodes highly suspicious for malignancy.  PET scan results from November 17, 2020 reviewed independently with 3 hypermetabolic right lower lobe lung nodules  consistent with pulmonary metastasis.  Repeat PET scan on March 01, 2021 reviewed independently with significant improvement of pulmonary nodules as well as known rectal primary.  Patient was discussed at tumor board and consensus was to refer to radiation oncology for consideration of concurrent chemotherapy and XRT.  We will further discuss with radiation oncology whether to pursue 8 cycles of FOLFOX and then transition to concurrent treatment will complete a total of 12 treatments.  Surgical resection is still a possibility depending on her overall results of the treatment.  Despite persistent thrombocytopenia, will proceed with cycle 8 of FOLFOX today.  Return to clinic in 2 days for pump removal and Ziextenzo.  Return to clinic in 3 weeks for further evaluation and consideration of cycle 9.  Treatment will be canceled if radiation treatments are pursued.  2. Recurrent follicular lymphoma: CT scan results from December 23, 2019 reviewed independently with no obvious evidence of recurrent or progressive disease.  PET scan results as above consistent with rectal cancer metastasis and no evidence of lymphoma.   Patient received Zevalin on July 30, 2017 with not much therapeutic effect.  She subsequently underwent cycles 5 of R-CHOP chemotherapy with Neulasta support completing on  December 12, 2017.  Patient continues to be in complete remission.  3.  Hypokalemia: Resolved. 4.  Neutropenia: White count has trended down, patient will receive Ziextenzo with her pump removal in 2 days. 5.  Thrombocytopenia: Chronic and unchanged.  Proceed with treatment cautiously as above.  Patient will now receive treatment every 3 weeks.   6.  Covid infection: Resolved.  Patient is now asymptomatic.  Patient expressed understanding and was in agreement with this plan. She also understands that She can call clinic at any time with any questions, concerns, or complaints.   Cancer Staging 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 [facility]) Staging form: Lymphoid Neoplasms, AJCC 6th Edition - Clinical stage from 08/27/2016: Stage IIE (lymphoma only) - Signed by Lloyd Huger, MD on 08/27/2016  Rectal cancer [facility]) Staging form: Colon and Rectum, AJCC 8th Edition - Clinical: Stage IVA Larwance Sachs, [facility]) - Signed by Lloyd Huger, MD on 11/02/2020   Lloyd Huger, MD   03/29/2021 1:54 PM

## 2021-03-29 ENCOUNTER — Other Ambulatory Visit: Payer: Self-pay

## 2021-03-29 ENCOUNTER — Encounter: Payer: Self-pay | Admitting: Oncology

## 2021-03-29 ENCOUNTER — Inpatient Hospital Stay: Payer: Medicare Other

## 2021-03-29 ENCOUNTER — Inpatient Hospital Stay (HOSPITAL_BASED_OUTPATIENT_CLINIC_OR_DEPARTMENT_OTHER): Payer: Medicare Other | Admitting: Oncology

## 2021-03-29 VITALS — BP 108/63 | HR 91 | Temp 97.3°F | Resp 18 | Ht <= 58 in | Wt 106.1 lb

## 2021-03-29 DIAGNOSIS — C20 Malignant neoplasm of rectum: Secondary | ICD-10-CM | POA: Diagnosis not present

## 2021-03-29 DIAGNOSIS — Z5111 Encounter for antineoplastic chemotherapy: Secondary | ICD-10-CM | POA: Diagnosis not present

## 2021-03-29 LAB — CBC WITH DIFFERENTIAL/PLATELET
Abs Immature Granulocytes: 0.01 10*3/uL (ref 0.00–0.07)
Basophils Absolute: 0 10*3/uL (ref 0.0–0.1)
Basophils Relative: 1 %
Eosinophils Absolute: 0.1 10*3/uL (ref 0.0–0.5)
Eosinophils Relative: 1 %
HCT: 38.9 % (ref 36.0–46.0)
Hemoglobin: 12.7 g/dL (ref 12.0–15.0)
Immature Granulocytes: 0 %
Lymphocytes Relative: 41 %
Lymphs Abs: 1.6 10*3/uL (ref 0.7–4.0)
MCH: 33.3 pg (ref 26.0–34.0)
MCHC: 32.6 g/dL (ref 30.0–36.0)
MCV: 102.1 fL — ABNORMAL HIGH (ref 80.0–100.0)
Monocytes Absolute: 0.9 10*3/uL (ref 0.1–1.0)
Monocytes Relative: 23 %
Neutro Abs: 1.3 10*3/uL — ABNORMAL LOW (ref 1.7–7.7)
Neutrophils Relative %: 34 %
Platelets: 81 10*3/uL — ABNORMAL LOW (ref 150–400)
RBC: 3.81 MIL/uL — ABNORMAL LOW (ref 3.87–5.11)
RDW: 14.3 % (ref 11.5–15.5)
WBC: 3.9 10*3/uL — ABNORMAL LOW (ref 4.0–10.5)
nRBC: 0 % (ref 0.0–0.2)

## 2021-03-29 LAB — COMPREHENSIVE METABOLIC PANEL
ALT: 19 U/L (ref 0–44)
AST: 31 U/L (ref 15–41)
Albumin: 4.1 g/dL (ref 3.5–5.0)
Alkaline Phosphatase: 71 U/L (ref 38–126)
Anion gap: 12 (ref 5–15)
BUN: 10 mg/dL (ref 8–23)
CO2: 25 mmol/L (ref 22–32)
Calcium: 9.4 mg/dL (ref 8.9–10.3)
Chloride: 101 mmol/L (ref 98–111)
Creatinine, Ser: 0.6 mg/dL (ref 0.44–1.00)
GFR, Estimated: >60 mL/min (ref >60)
Glucose, Bld: 104 mg/dL — ABNORMAL HIGH (ref 70–99)
Potassium: 4 mmol/L (ref 3.5–5.1)
Sodium: 138 mmol/L (ref 135–145)
Total Bilirubin: 0.5 mg/dL (ref 0.3–1.2)
Total Protein: 7.1 g/dL (ref 6.5–8.1)

## 2021-03-29 MED ORDER — SODIUM CHLORIDE 0.9% FLUSH
10.0000 mL | INTRAVENOUS | Status: DC | PRN
  Administered 2021-03-29: 10 mL via INTRAVENOUS
  Filled 2021-03-29: qty 10

## 2021-03-29 MED ORDER — PALONOSETRON HCL INJECTION 0.25 MG/5ML
0.2500 mg | Freq: Once | INTRAVENOUS | Status: AC
  Administered 2021-03-29: 0.25 mg via INTRAVENOUS
  Filled 2021-03-29: qty 5

## 2021-03-29 MED ORDER — LEUCOVORIN CALCIUM INJECTION 350 MG
600.0000 mg | Freq: Once | INTRAVENOUS | Status: AC
  Administered 2021-03-29: 600 mg via INTRAVENOUS
  Filled 2021-03-29: qty 25

## 2021-03-29 MED ORDER — SODIUM CHLORIDE 0.9 % IV SOLN
10.0000 mg | Freq: Once | INTRAVENOUS | Status: AC
  Administered 2021-03-29: 10 mg via INTRAVENOUS
  Filled 2021-03-29: qty 10

## 2021-03-29 MED ORDER — LEUCOVORIN CALCIUM INJECTION 350 MG: 400.0000 mg/m2 | Freq: Once | INTRAVENOUS | Status: DC

## 2021-03-29 MED ORDER — DEXTROSE 5 % IV SOLN
Freq: Once | INTRAVENOUS | Status: AC
  Filled 2021-03-29: qty 250

## 2021-03-29 MED ORDER — HEPARIN SOD (PORK) LOCK FLUSH 100 UNIT/ML IV SOLN
500.0000 [IU] | Freq: Once | INTRAVENOUS | Status: DC
  Filled 2021-03-29: qty 5

## 2021-03-29 MED ORDER — SODIUM CHLORIDE 0.9 % IV SOLN
2400.0000 mg/m2 | INTRAVENOUS | Status: DC
  Administered 2021-03-29: 3500 mg via INTRAVENOUS
  Filled 2021-03-29: qty 70

## 2021-03-29 MED ORDER — OXALIPLATIN CHEMO INJECTION 100 MG/20ML
85.0000 mg/m2 | Freq: Once | INTRAVENOUS | Status: AC
  Administered 2021-03-29: 125 mg via INTRAVENOUS
  Filled 2021-03-29: qty 20

## 2021-03-29 MED ORDER — FLUOROURACIL CHEMO INJECTION 2.5 GM/50ML
400.0000 mg/m2 | Freq: Once | INTRAVENOUS | Status: AC
  Administered 2021-03-29: 600 mg via INTRAVENOUS
  Filled 2021-03-29: qty 12

## 2021-03-29 NOTE — Patient Instructions (Signed)
[facility]  Discharge Instructions: Thank you for choosing [facility] to provide your oncology and hematology care.  If you have a lab appointment with the [facility], please go directly to the [facility] and check in at the registration area.  Wear comfortable clothing and clothing appropriate for easy access to any Portacath or PICC line.   We strive to give you quality time with your provider. You may need to reschedule your appointment if you arrive late (15 or more minutes).  Arriving late affects you and other patients whose appointments are after yours.  Also, if you miss three or more appointments without notifying the office, you may be dismissed from the clinic at the provider's discretion.      For prescription refill requests, have your pharmacy contact our office and allow 72 hours for refills to be completed.    Today you received the following chemotherapy and/or immunotherapy agents Oxaliplatin, Leucovorin, Adrucil       To help prevent nausea and vomiting after your treatment, we encourage you to take your nausea medication as directed.  BELOW ARE SYMPTOMS THAT SHOULD BE REPORTED IMMEDIATELY: . *FEVER GREATER THAN 100.4 F (38 C) OR HIGHER . *CHILLS OR SWEATING . *NAUSEA AND VOMITING THAT IS NOT CONTROLLED WITH YOUR NAUSEA MEDICATION . *UNUSUAL SHORTNESS OF BREATH . *UNUSUAL BRUISING OR BLEEDING . *URINARY PROBLEMS (pain or burning when urinating, or frequent urination) . *BOWEL PROBLEMS (unusual diarrhea, constipation, pain near the anus) . TENDERNESS IN MOUTH AND THROAT WITH OR WITHOUT PRESENCE OF ULCERS (sore throat, sores in mouth, or a toothache) . UNUSUAL RASH, SWELLING OR PAIN  . UNUSUAL VAGINAL DISCHARGE OR ITCHING   Items with * indicate a potential emergency and should be followed up as soon as possible or go to the Emergency Department if any problems should occur.  Please show the CHEMOTHERAPY ALERT  CARD or IMMUNOTHERAPY ALERT CARD at check-in to the Emergency Department and triage nurse.  Should you have questions after your visit or need to cancel or reschedule your appointment, please contact [facility]  [phone] and follow the prompts.  Office hours are 8:00 a.m. to 4:30 p.m. Monday - Friday. Please note that voicemails left after 4:00 p.m. may not be returned until the following business day.  We are closed weekends and major holidays. You have access to a nurse at all times for urgent questions. Please call the main number to the clinic [phone] and follow the prompts.  For any non-urgent questions, you may also contact your provider using MyChart. We now offer e-Visits for anyone 20 and older to request care online for non-urgent symptoms. For details visit [email or web address].   Also download the MyChart app! Go to the app store, search "MyChart", open the app, select [facility], and log in with your MyChart username and password.  Due to Covid, a mask is required upon entering the hospital/clinic. If you do not have a mask, one will be given to you upon arrival. For doctor visits, patients may have 1 support person aged 106 or older with them. For treatment visits, patients cannot have anyone with them due to current Covid guidelines and our immunocompromised population.

## 2021-03-31 ENCOUNTER — Inpatient Hospital Stay: Payer: Medicare Other

## 2021-03-31 VITALS — BP 137/95 | HR 104 | Temp 98.6°F | Resp 18

## 2021-03-31 DIAGNOSIS — Z5111 Encounter for antineoplastic chemotherapy: Secondary | ICD-10-CM | POA: Diagnosis not present

## 2021-03-31 DIAGNOSIS — C20 Malignant neoplasm of rectum: Secondary | ICD-10-CM

## 2021-03-31 MED ORDER — HEPARIN SOD (PORK) LOCK FLUSH 100 UNIT/ML IV SOLN
INTRAVENOUS | Status: AC
  Filled 2021-03-31: qty 5

## 2021-03-31 MED ORDER — HEPARIN SOD (PORK) LOCK FLUSH 100 UNIT/ML IV SOLN
500.0000 [IU] | Freq: Once | INTRAVENOUS | Status: AC | PRN
  Administered 2021-03-31: 500 [IU]
  Filled 2021-03-31: qty 5

## 2021-03-31 MED ORDER — SODIUM CHLORIDE 0.9% FLUSH
10.0000 mL | INTRAVENOUS | Status: DC | PRN
  Administered 2021-03-31: 10 mL
  Filled 2021-03-31: qty 10

## 2021-03-31 MED ORDER — PEGFILGRASTIM-BMEZ 6 MG/0.6ML ~~LOC~~ SOSY
6.0000 mg | PREFILLED_SYRINGE | Freq: Once | SUBCUTANEOUS | Status: AC
Start: 2021-03-31 — End: 2021-03-31
  Administered 2021-03-31: 6 mg via SUBCUTANEOUS
  Filled 2021-03-31: qty 0.6

## 2021-03-31 NOTE — Patient Instructions (Signed)
[facility]  Discharge Instructions: Thank you for choosing [facility] to provide your oncology and hematology care.  If you have a lab appointment with the [name], please go directly to the [facility] and check in at the registration area.  Wear comfortable clothing and clothing appropriate for easy access to any Portacath or PICC line.   We strive to give you quality time with your provider. You may need to reschedule your appointment if you arrive late (15 or more minutes).  Arriving late affects you and other patients whose appointments are after yours.  Also, if you miss three or more appointments without notifying the office, you may be dismissed from the clinic at the provider's discretion.      For prescription refill requests, have your pharmacy contact our office and allow 72 hours for refills to be completed.    Today you received the following chemotherapy and/or immunotherapy agents Ziextenzo   To help prevent nausea and vomiting after your treatment, we encourage you to take your nausea medication as directed.  BELOW ARE SYMPTOMS THAT SHOULD BE REPORTED IMMEDIATELY: . *FEVER GREATER THAN 100.4 F (38 C) OR HIGHER . *CHILLS OR SWEATING . *NAUSEA AND VOMITING THAT IS NOT CONTROLLED WITH YOUR NAUSEA MEDICATION . *UNUSUAL SHORTNESS OF BREATH . *UNUSUAL BRUISING OR BLEEDING . *URINARY PROBLEMS (pain or burning when urinating, or frequent urination) . *BOWEL PROBLEMS (unusual diarrhea, constipation, pain near the anus) . TENDERNESS IN MOUTH AND THROAT WITH OR WITHOUT PRESENCE OF ULCERS (sore throat, sores in mouth, or a toothache) . UNUSUAL RASH, SWELLING OR PAIN  . UNUSUAL VAGINAL DISCHARGE OR ITCHING   Items with * indicate a potential emergency and should be followed up as soon as possible or go to the Emergency Department if any problems should occur.  Please show the CHEMOTHERAPY ALERT CARD or IMMUNOTHERAPY ALERT  CARD at check-in to the Emergency Department and triage nurse.  Should you have questions after your visit or need to cancel or reschedule your appointment, please contact [facility]  [phone] and follow the prompts.  Office hours are 8:00 a.m. to 4:30 p.m. Monday - Friday. Please note that voicemails left after 4:00 p.m. may not be returned until the following business day.  We are closed weekends and major holidays. You have access to a nurse at all times for urgent questions. Please call the main number to the clinic [phone] and follow the prompts.  For any non-urgent questions, you may also contact your provider using MyChart. We now offer e-Visits for anyone 58 and older to request care online for non-urgent symptoms. For details visit [email or web address].   Also download the MyChart app! Go to the app store, search "MyChart", open the app, select [facility], and log in with your MyChart username and password.  Due to Covid, a mask is required upon entering the hospital/clinic. If you do not have a mask, one will be given to you upon arrival. For doctor visits, patients may have 1 support person aged 45 or older with them. For treatment visits, patients cannot have anyone with them due to current Covid guidelines and our immunocompromised population.   Pegfilgrastim injection What is this medicine? PEGFILGRASTIM (PEG fil gra stim) is a long-acting granulocyte colony-stimulating factor that stimulates the growth of neutrophils, a type of white blood cell important in the body's fight against infection. It is used to reduce the incidence of fever and infection in  patients with certain types of cancer who are receiving chemotherapy that affects the bone marrow, and to increase survival after being exposed to high doses of radiation. This medicine may be used for other purposes; ask your health care provider or pharmacist if you have questions. COMMON  BRAND NAME(S): Rexene Edison, Ziextenzo What should I tell my health care provider before I take this medicine? They need to know if you have any of these conditions:  kidney disease  latex allergy  ongoing radiation therapy  sickle cell disease  skin reactions to acrylic adhesives (On-Body Injector only)  an unusual or allergic reaction to pegfilgrastim, filgrastim, other medicines, foods, dyes, or preservatives  pregnant or trying to get pregnant  breast-feeding How should I use this medicine? This medicine is for injection under the skin. If you get this medicine at home, you will be taught how to prepare and give the pre-filled syringe or how to use the On-body Injector. Refer to the patient Instructions for Use for detailed instructions. Use exactly as directed. Tell your healthcare provider immediately if you suspect that the On-body Injector may not have performed as intended or if you suspect the use of the On-body Injector resulted in a missed or partial dose. It is important that you put your used needles and syringes in a special sharps container. Do not put them in a trash can. If you do not have a sharps container, call your pharmacist or healthcare provider to get one. Talk to your pediatrician regarding the use of this medicine in children. While this drug may be prescribed for selected conditions, precautions do apply. Overdosage: If you think you have taken too much of this medicine contact a poison control center or emergency room at once. NOTE: This medicine is only for you. Do not share this medicine with others. What if I miss a dose? It is important not to miss your dose. Call your doctor or health care professional if you miss your dose. If you miss a dose due to an On-body Injector failure or leakage, a new dose should be administered as soon as possible using a single prefilled syringe for manual use. What may interact with this  medicine? Interactions have not been studied. This list may not describe all possible interactions. Give your health care provider a list of all the medicines, herbs, non-prescription drugs, or dietary supplements you use. Also tell them if you smoke, drink alcohol, or use illegal drugs. Some items may interact with your medicine. What should I watch for while using this medicine? Your condition will be monitored carefully while you are receiving this medicine. You may need blood work done while you are taking this medicine. Talk to your health care provider about your risk of cancer. You may be more at risk for certain types of cancer if you take this medicine. If you are going to need a MRI, CT scan, or other procedure, tell your doctor that you are using this medicine (On-Body Injector only). What side effects may I notice from receiving this medicine? Side effects that you should report to your doctor or health care professional as soon as possible:  allergic reactions (skin rash, itching or hives, swelling of the face, lips, or tongue)  back pain  dizziness  fever  pain, redness, or irritation at site where injected  pinpoint red spots on the skin  red or dark-brown urine  shortness of breath or breathing problems  stomach or side pain, or pain  at the shoulder  swelling  tiredness  trouble passing urine or change in the amount of urine  unusual bruising or bleeding Side effects that usually do not require medical attention (report to your doctor or health care professional if they continue or are bothersome):  bone pain  muscle pain This list may not describe all possible side effects. Call your doctor for medical advice about side effects. You may report side effects to FDA at 1-800-FDA-1088. Where should I keep my medicine? Keep out of the reach of children. If you are using this medicine at home, you will be instructed on how to store it. Throw away any unused  medicine after the expiration date on the label. NOTE: This sheet is a summary. It may not cover all possible information. If you have questions about this medicine, talk to your doctor, pharmacist, or health care provider.  2021 Elsevier/Gold Standard (2019-11-13 13:20:51)

## 2021-04-07 ENCOUNTER — Ambulatory Visit
Admission: RE | Admit: 2021-04-07 | Discharge: 2021-04-07 | Disposition: A | Discharge status: Home / Self Care | Payer: Medicare Other | Source: Ambulatory Visit | Attending: Radiation Oncology | Admitting: Radiation Oncology

## 2021-04-07 ENCOUNTER — Encounter: Payer: Self-pay | Admitting: Radiation Oncology

## 2021-04-07 VITALS — BP 157/72 | HR 103 | Temp 97.3°F | Wt 102.0 lb

## 2021-04-07 DIAGNOSIS — Z8572 Personal history of non-Hodgkin lymphomas: Secondary | ICD-10-CM | POA: Insufficient documentation

## 2021-04-07 DIAGNOSIS — C7801 Secondary malignant neoplasm of right lung: Secondary | ICD-10-CM | POA: Insufficient documentation

## 2021-04-07 DIAGNOSIS — C20 Malignant neoplasm of rectum: Secondary | ICD-10-CM | POA: Diagnosis not present

## 2021-04-07 DIAGNOSIS — K6289 Other specified diseases of anus and rectum: Secondary | ICD-10-CM

## 2021-04-07 NOTE — Progress Notes (Signed)
Radiation Oncology Follow up Note  Name: Veronica Boyd   Date:   04/07/2021 MRN:  [id number] DOB: 1946-06-19    This 75 y.o. female presents to the clinic today for reevaluation of locally advanced rectal cancer currently undergoing FOLFOX chemotherapy with excellent response.  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  On MRI scan the mass was 2 cm from the anal verge with 5 cm extent craniocaudally.  There was tumor beyond the rectovaginal recess with suspected invasion of the vaginal wall posteriorly.  There is also involving the internal and anal sphincter.  Patient has been undergoing T [place] under Dr. Gary Fleet direction and has had an excellent response on 8 cycles of FOLFOX.  PET scan in April showed response to therapy with decrease in size of right lower lobe pulmonary metastasis as well as significant decrease in hypermetabolic activity in the pelvis.  She is otherwise doing well she specifically denies diarrhea dysuria or any other GI/GU complaints.  She is seen today for contemplation of beginning concurrent chemoradiation.  COMPLICATIONS OF TREATMENT: none  FOLLOW UP COMPLIANCE: keeps appointments   PHYSICAL EXAM:  BP (!) 157/72   Pulse (!) 103   Temp (!) 97.3 F (36.3 C) (Tympanic)   Wt 102 lb (46.3 kg)   BMI 21.32 kg/m  Thin frail female in NAD.  Well-developed well-nourished patient in NAD. HEENT reveals PERLA, EOMI, discs not visualized.  Oral cavity is clear. No oral mucosal lesions are identified. Neck is clear without evidence of cervical or supraclavicular adenopathy. Lungs are clear to A&P. Cardiac examination is essentially unremarkable with regular rate and rhythm without murmur rub  or thrill. Abdomen is benign with no organomegaly or masses noted. Motor sensory and DTR levels are equal and symmetric in the upper and lower extremities. Cranial nerves II through XII are grossly intact. Proprioception is intact. No peripheral adenopathy or edema is identified. No motor or sensory levels are noted. Crude visual fields are within normal range.  RADIOLOGY RESULTS: PET scan reviewed compatible with above-stated findings  PLAN: Present time I would at this point switch to concurrent chemoradiation.  I would plan on delivering 4500 cGy to her whole pelvis boost in the area of residual hypermetabolic disease another 510 cGy.  We do this along with concurrent chemotherapy under medical oncology's direction.  Patient also at that point may be a candidate for surgical resection and will be referred for evaluation.  Patient and her daughter both comprehend the treatment plan well.  I personally set up and ordered CT simulation for next week.  We will coordinate her chemotherapy with medical oncology.  I would like to take this opportunity to thank you for allowing me to participate in the care of your patient.[name], MD

## 2021-04-12 ENCOUNTER — Ambulatory Visit: Payer: Medicare Other

## 2021-04-12 DIAGNOSIS — Z510 Encounter for antineoplastic radiation therapy: Secondary | ICD-10-CM | POA: Diagnosis not present

## 2021-04-12 DIAGNOSIS — C20 Malignant neoplasm of rectum: Secondary | ICD-10-CM | POA: Insufficient documentation

## 2021-04-17 DIAGNOSIS — Z510 Encounter for antineoplastic radiation therapy: Secondary | ICD-10-CM | POA: Diagnosis not present

## 2021-04-18 ENCOUNTER — Other Ambulatory Visit: Payer: Self-pay | Admitting: Oncology

## 2021-04-18 NOTE — Progress Notes (Signed)
DISCONTINUE OFF PATHWAY REGIMEN - Colorectal   OFF01020:mFOLFOX6 (Leucovorin IV D1 + Fluorouracil IV D1/CIV D1,2 + Oxaliplatin IV D1) q14 Days:   A cycle is every 14 days:     Oxaliplatin      Leucovorin      Fluorouracil      Fluorouracil   **Always confirm dose/schedule in your pharmacy ordering system**  REASON: Other Reason PRIOR TREATMENT: Off Pathway: mFOLFOX6 (Leucovorin IV D1 + Fluorouracil IV D1/CIV D1,2 + Oxaliplatin IV D1) q14 Days TREATMENT RESPONSE: Partial Response (PR)  Colorectal - No Medical Intervention - Off Treatment.  Patient Characteristics: Preoperative or Nonsurgical Candidate (Clinical Staging), Distant Metastasis Tumor Location: Rectal Therapeutic Status: Preoperative or Nonsurgical Candidate (Clinical Staging) AJCC T Category: cT4a AJCC N Category: cN2a AJCC M Category: Noelle.Marry AJCC 8 Stage Grouping: IVA

## 2021-04-18 NOTE — Progress Notes (Signed)
Pharmacist Chemotherapy Monitoring - Initial Assessment    Anticipated start date: 062022    Regimen:  Are orders appropriate based on the patient's diagnosis, regimen, and cycle? Yes Does the plan date match the patient's scheduled date? Yes Is the sequencing of drugs appropriate? Yes Are the premedications appropriate for the patient's regimen? Yes Prior Authorization for treatment is: Pending If applicable, is the correct biosimilar selected based on the patient's insurance? yes  Organ Function and Labs: Are dose adjustments needed based on the patient's renal function, hepatic function, or hematologic function? No Are appropriate labs ordered prior to the start of patient's treatment? Yes Other organ system assessment, if indicated: N/A The following baseline labs, if indicated, have been ordered: N/A  Dose Assessment: Are the drug doses appropriate? Yes Are the following correct: Drug concentrations Yes IV fluid compatible with drug Yes Administration routes Yes Timing of therapy Yes If applicable, does the patient have documented access for treatment and/or plans for port-a-cath placement? yes If applicable, have lifetime cumulative doses been properly documented and assessed? yes Lifetime Dose Tracking   Doxorubicin: 258.39 mg/m2 (360 mg) = 57.42 % of the maximum lifetime dose of 450 mg/m2   Oxaliplatin: 704.321 mg/m2 (1,000 mg) = 117.39 % of the maximum lifetime dose of 600 mg/m2    Toxicity Monitoring/Prevention: The patient has the following take home antiemetics prescribed: N/A The patient has the following take home medications prescribed: N/A Medication allergies and previous infusion related reactions, if applicable, have been reviewed and addressed. Yes The patient's current medication list has been assessed for drug-drug interactions with their chemotherapy regimen. no significant drug-drug interactions were identified on review.  Order Review: Are the treatment  plan orders signed? Yes Is the patient scheduled to see a provider prior to their treatment? Yes  I verify that I have reviewed each item in the above checklist and answered each question accordingly.  Adelina Mings, RPH, 04/18/2021  1:48 PM

## 2021-04-18 NOTE — Progress Notes (Signed)
DISCONTINUE ON PATHWAY REGIMEN - Colorectal  No Medical Intervention - Off Treatment.  REASON: Other Reason PRIOR TREATMENT: Off Treatment  START OFF PATHWAY REGIMEN - Colorectal   [id number]:Fluorouracil 225 mg/m2/day CIV D1-5 q7 Days + RT:   A cycle is every 7 days, concurrent with RT:     Fluorouracil   **Always confirm dose/schedule in your pharmacy ordering system**  Patient Characteristics: Preoperative or Nonsurgical Candidate (Clinical Staging), Distant Metastasis Tumor Location: Rectal Therapeutic Status: Preoperative or Nonsurgical Candidate (Clinical Staging) AJCC T Category: cT4a AJCC N Category: cN2a AJCC M Category: Noelle.Marry AJCC 8 Stage Grouping: IVA Intent of Therapy: Non-Curative / Palliative Intent, Discussed with Patient

## 2021-04-19 ENCOUNTER — Inpatient Hospital Stay: Payer: Medicare Other | Admitting: Oncology

## 2021-04-19 ENCOUNTER — Ambulatory Visit: Admission: RE | Admit: 2021-04-19 | Payer: Medicare Other | Source: Ambulatory Visit

## 2021-04-19 ENCOUNTER — Inpatient Hospital Stay: Payer: Medicare Other

## 2021-04-19 DIAGNOSIS — Z510 Encounter for antineoplastic radiation therapy: Secondary | ICD-10-CM | POA: Diagnosis not present

## 2021-04-20 ENCOUNTER — Ambulatory Visit: Payer: Medicare Other

## 2021-04-21 ENCOUNTER — Ambulatory Visit: Payer: Medicare Other

## 2021-04-21 ENCOUNTER — Inpatient Hospital Stay: Payer: Medicare Other

## 2021-04-24 ENCOUNTER — Inpatient Hospital Stay: Payer: Medicare Other

## 2021-04-24 ENCOUNTER — Inpatient Hospital Stay: Payer: Medicare Other | Attending: Oncology

## 2021-04-24 ENCOUNTER — Ambulatory Visit
Admission: RE | Admit: 2021-04-24 | Discharge: 2021-04-24 | Disposition: A | Discharge status: Home / Self Care | Payer: Medicare Other | Source: Ambulatory Visit | Attending: Radiation Oncology | Admitting: Radiation Oncology

## 2021-04-24 ENCOUNTER — Other Ambulatory Visit: Payer: Self-pay | Admitting: Oncology

## 2021-04-24 VITALS — BP 112/72 | HR 80 | Temp 97.0°F | Resp 18 | Wt 104.0 lb

## 2021-04-24 DIAGNOSIS — D696 Thrombocytopenia, unspecified: Secondary | ICD-10-CM | POA: Diagnosis not present

## 2021-04-24 DIAGNOSIS — C20 Malignant neoplasm of rectum: Secondary | ICD-10-CM | POA: Insufficient documentation

## 2021-04-24 DIAGNOSIS — E876 Hypokalemia: Secondary | ICD-10-CM | POA: Insufficient documentation

## 2021-04-24 DIAGNOSIS — Z510 Encounter for antineoplastic radiation therapy: Secondary | ICD-10-CM | POA: Diagnosis not present

## 2021-04-24 DIAGNOSIS — Z808 Family history of malignant neoplasm of other organs or systems: Secondary | ICD-10-CM | POA: Diagnosis not present

## 2021-04-24 DIAGNOSIS — Z803 Family history of malignant neoplasm of breast: Secondary | ICD-10-CM | POA: Diagnosis not present

## 2021-04-24 DIAGNOSIS — Z8719 Personal history of other diseases of the digestive system: Secondary | ICD-10-CM | POA: Diagnosis not present

## 2021-04-24 DIAGNOSIS — C8208 Follicular lymphoma grade I, lymph nodes of multiple sites: Secondary | ICD-10-CM

## 2021-04-24 DIAGNOSIS — Z79899 Other long term (current) drug therapy: Secondary | ICD-10-CM | POA: Diagnosis not present

## 2021-04-24 DIAGNOSIS — Z833 Family history of diabetes mellitus: Secondary | ICD-10-CM | POA: Insufficient documentation

## 2021-04-24 DIAGNOSIS — Z8572 Personal history of non-Hodgkin lymphomas: Secondary | ICD-10-CM | POA: Insufficient documentation

## 2021-04-24 DIAGNOSIS — Z5111 Encounter for antineoplastic chemotherapy: Secondary | ICD-10-CM | POA: Insufficient documentation

## 2021-04-24 DIAGNOSIS — Z801 Family history of malignant neoplasm of trachea, bronchus and lung: Secondary | ICD-10-CM | POA: Diagnosis not present

## 2021-04-24 LAB — CBC WITH DIFFERENTIAL/PLATELET
Abs Immature Granulocytes: 0.03 10*3/uL (ref 0.00–0.07)
Basophils Absolute: 0 10*3/uL (ref 0.0–0.1)
Basophils Relative: 0 %
Eosinophils Absolute: 0 10*3/uL (ref 0.0–0.5)
Eosinophils Relative: 0 %
HCT: 39.6 % (ref 36.0–46.0)
Hemoglobin: 13.1 g/dL (ref 12.0–15.0)
Immature Granulocytes: 0 %
Lymphocytes Relative: 20 %
Lymphs Abs: 1.3 10*3/uL (ref 0.7–4.0)
MCH: 34.2 pg — ABNORMAL HIGH (ref 26.0–34.0)
MCHC: 33.1 g/dL (ref 30.0–36.0)
MCV: 103.4 fL — ABNORMAL HIGH (ref 80.0–100.0)
Monocytes Absolute: 1.5 10*3/uL — ABNORMAL HIGH (ref 0.1–1.0)
Monocytes Relative: 22 %
Neutro Abs: 3.9 10*3/uL (ref 1.7–7.7)
Neutrophils Relative %: 58 %
Platelets: 125 10*3/uL — ABNORMAL LOW (ref 150–400)
RBC: 3.83 MIL/uL — ABNORMAL LOW (ref 3.87–5.11)
RDW: 14.6 % (ref 11.5–15.5)
WBC: 6.8 10*3/uL (ref 4.0–10.5)
nRBC: 0 % (ref 0.0–0.2)

## 2021-04-24 LAB — COMPREHENSIVE METABOLIC PANEL
ALT: 22 U/L (ref 0–44)
AST: 34 U/L (ref 15–41)
Albumin: 4.1 g/dL (ref 3.5–5.0)
Alkaline Phosphatase: 70 U/L (ref 38–126)
Anion gap: 11 (ref 5–15)
BUN: 12 mg/dL (ref 8–23)
CO2: 25 mmol/L (ref 22–32)
Calcium: 9.3 mg/dL (ref 8.9–10.3)
Chloride: 104 mmol/L (ref 98–111)
Creatinine, Ser: 0.6 mg/dL (ref 0.44–1.00)
GFR, Estimated: >60 mL/min (ref >60)
Glucose, Bld: 94 mg/dL (ref 70–99)
Potassium: 3.8 mmol/L (ref 3.5–5.1)
Sodium: 140 mmol/L (ref 135–145)
Total Bilirubin: 0.5 mg/dL (ref 0.3–1.2)
Total Protein: 7.4 g/dL (ref 6.5–8.1)

## 2021-04-24 MED ORDER — SODIUM CHLORIDE 0.9 % IV SOLN
Freq: Once | INTRAVENOUS | Status: DC
  Filled 2021-04-24: qty 250

## 2021-04-24 MED ORDER — SODIUM CHLORIDE 0.9% FLUSH
10.0000 mL | INTRAVENOUS | Status: AC | PRN
  Administered 2021-04-24: 10 mL via INTRAVENOUS
  Filled 2021-04-24: qty 10

## 2021-04-24 MED ORDER — SODIUM CHLORIDE 0.9 % IV SOLN
225.0000 mg/m2/d | INTRAVENOUS | Status: DC
  Administered 2021-04-24: 1550 mg via INTRAVENOUS
  Filled 2021-04-24: qty 31

## 2021-04-24 NOTE — Patient Instructions (Signed)
[facility]  Discharge Instructions: Thank you for choosing [facility] to provide your oncology and hematology care.  If you have a lab appointment with the [facility], please go directly to the [facility] and check in at the registration area.  Wear comfortable clothing and clothing appropriate for easy access to any Portacath or PICC line.   We strive to give you quality time with your provider. You may need to reschedule your appointment if you arrive late (15 or more minutes).  Arriving late affects you and other patients whose appointments are after yours.  Also, if you miss three or more appointments without notifying the office, you may be dismissed from the clinic at the provider's discretion.      For prescription refill requests, have your pharmacy contact our office and allow 72 hours for refills to be completed.    Today you received the following chemotherapy and/or immunotherapy agent _ 5FU pump   To help prevent nausea and vomiting after your treatment, we encourage you to take your nausea medication as directed.  BELOW ARE SYMPTOMS THAT SHOULD BE REPORTED IMMEDIATELY: *FEVER GREATER THAN 100.4 F (38 C) OR HIGHER *CHILLS OR SWEATING *NAUSEA AND VOMITING THAT IS NOT CONTROLLED WITH YOUR NAUSEA MEDICATION *UNUSUAL SHORTNESS OF BREATH *UNUSUAL BRUISING OR BLEEDING *URINARY PROBLEMS (pain or burning when urinating, or frequent urination) *BOWEL PROBLEMS (unusual diarrhea, constipation, pain near the anus) TENDERNESS IN MOUTH AND THROAT WITH OR WITHOUT PRESENCE OF ULCERS (sore throat, sores in mouth, or a toothache) UNUSUAL RASH, SWELLING OR PAIN  UNUSUAL VAGINAL DISCHARGE OR ITCHING   Items with * indicate a potential emergency and should be followed up as soon as possible or go to the Emergency Department if any problems should occur.  Please show the CHEMOTHERAPY ALERT CARD or IMMUNOTHERAPY ALERT CARD at check-in to  the Emergency Department and triage nurse.  Should you have questions after your visit or need to cancel or reschedule your appointment, please contact [facility]  [phone] and follow the prompts.  Office hours are 8:00 a.m. to 4:30 p.m. Monday - Friday. Please note that voicemails left after 4:00 p.m. may not be returned until the following business day.  We are closed weekends and major holidays. You have access to a nurse at all times for urgent questions. Please call the main number to the clinic [phone] and follow the prompts.  For any non-urgent questions, you may also contact your provider using MyChart. We now offer e-Visits for anyone 79 and older to request care online for non-urgent symptoms. For details visit [email or web address].   Also download the MyChart app! Go to the app store, search "MyChart", open the app, select [facility], and log in with your MyChart username and password.  Due to Covid, a mask is required upon entering the hospital/clinic. If you do not have a mask, one will be given to you upon arrival. For doctor visits, patients may have 1 support person aged 63 or older with them. For treatment visits, patients cannot have anyone with them due to current Covid guidelines and our immunocompromised population.

## 2021-04-25 ENCOUNTER — Ambulatory Visit
Admission: RE | Admit: 2021-04-25 | Discharge: 2021-04-25 | Disposition: A | Discharge status: Home / Self Care | Payer: Medicare Other | Source: Ambulatory Visit | Attending: Radiation Oncology | Admitting: Radiation Oncology

## 2021-04-25 ENCOUNTER — Other Ambulatory Visit: Payer: Self-pay

## 2021-04-25 ENCOUNTER — Encounter: Payer: Self-pay | Admitting: Oncology

## 2021-04-25 ENCOUNTER — Inpatient Hospital Stay (HOSPITAL_BASED_OUTPATIENT_CLINIC_OR_DEPARTMENT_OTHER): Payer: Medicare Other | Admitting: Oncology

## 2021-04-25 VITALS — BP 111/84 | HR 100 | Temp 97.7°F | Resp 17 | Ht <= 58 in | Wt 104.5 lb

## 2021-04-25 DIAGNOSIS — C20 Malignant neoplasm of rectum: Secondary | ICD-10-CM

## 2021-04-25 DIAGNOSIS — Z510 Encounter for antineoplastic radiation therapy: Secondary | ICD-10-CM | POA: Diagnosis not present

## 2021-04-25 DIAGNOSIS — Z5111 Encounter for antineoplastic chemotherapy: Secondary | ICD-10-CM | POA: Diagnosis not present

## 2021-04-25 NOTE — Progress Notes (Signed)
Veronica Boyd  [phone] [phone]) [phone]  ID: Veronica Boyd OB: [birth year]  MR#: [id number]  OEV#:[id number]  Patient Care Team: Tracie Harrier, MD as PCP - General (Internal Medicine) Lloyd Huger, MD as Consulting Physician (Oncology) Leonie Green, MD as Referring Physician (Surgery) [name], MD as Referring Physician (Radiation Oncology) Jacquelin Hawking, NP as Nurse Practitioner (Oncology) Clent Jacks, RN as Oncology Nurse Navigator   CHIEF COMPLAINT: Stage IV rectal cancer, history of follicular lymphoma.  INTERVAL HISTORY: Patient returns to clinic today for further evaluation and 5-FU pump monitoring she initiated cycle 1 yesterday at 1 she is tolerating her treatments well without significant side effects.  She currently feels well and is asymptomatic.  She does not complain of any weakness or fatigue.  She has a good appetite and denies weight loss.  She has no neurologic complaints.  She denies any recent fevers or illnesses.  She denies any chest pain, shortness of breath, cough, or hemoptysis. She denies any nausea, vomiting, constipation, or diarrhea. She has no urinary complaints.  Patient offers no specific complaints today.  REVIEW OF SYSTEMS:   Review of Systems  Constitutional: Negative.  Negative for fever, malaise/fatigue and weight loss.  Respiratory: Negative.  Negative for cough, hemoptysis and shortness of breath.   Cardiovascular: Negative.  Negative for chest pain and leg swelling.  Gastrointestinal: Negative.  Negative for abdominal pain, blood in stool, melena, nausea and vomiting.  Genitourinary: Negative.  Negative for dysuria.  Musculoskeletal: Negative.  Negative for back pain, joint pain and neck pain.  Skin: Negative.  Negative for rash.  Neurological: Negative.  Negative for sensory change, focal weakness, weakness and headaches.  Psychiatric/Behavioral: Negative.  The patient is not  nervous/anxious and does not have insomnia.    As per HPI. Otherwise, a complete review of systems is negative.   PAST MEDICAL HISTORY: Past Medical History:  Diagnosis Date   Arthritis    Cataract    bilateral   Chicken pox    Colon polyp    Follicular lymphoma ([place]) 08/2016   lymph nodes    Hyperlipidemia    Osteoporosis     PAST SURGICAL HISTORY: Past Surgical History:  Procedure Laterality Date   AXILLARY LYMPH NODE DISSECTION Right 08/21/2016   Procedure: AXILLARY LYMPH NODE excision;  Surgeon: Leonie Green, MD;  Location: [facility] ORS;  Service: General;  Laterality: Right;   CATARACT EXTRACTION, BILATERAL Bilateral    COLONOSCOPY N/A 09/19/2020   Procedure: COLONOSCOPY;  Surgeon: Lesly Rubenstein, MD;  Location: [facility] ENDOSCOPY;  Service: Endoscopy;  Laterality: N/A;   EYE SURGERY     JOINT REPLACEMENT Left    PORTA CATH INSERTION N/A 09/16/2017   Procedure: PORTA CATH INSERTION;  Surgeon: Algernon Huxley, MD;  Location: [facility];  Service: Cardiovascular;  Laterality: N/A;   TONSILLECTOMY     TOTAL HIP ARTHROPLASTY Left 1992    FAMILY HISTORY: Family History  Problem Relation Age of Onset   Diabetes Sister    Lung cancer Brother    Diabetes Brother    Basal cell carcinoma Daughter    Breast cancer Paternal Aunt     ADVANCED DIRECTIVES (Y/N):  N  HEALTH MAINTENANCE: Social History   Tobacco Use   Smoking status: Never   Smokeless tobacco: Never  Vaping Use   Vaping Use: Never used  Substance Use Topics   Alcohol use: No   Drug use: No     Colonoscopy:  PAP:  Bone density:  Lipid panel:  No Known Allergies  Current Outpatient Medications  Medication Sig Dispense Refill   alendronate (FOSAMAX) 70 MG tablet TAKE 1 TABLET EVERY 7 DAYS TAKE WITH A FULL GLASS OF WATER. DO NOT LIE DOWN FOR THE NEXT 30 MIN.  3   diclofenac sodium (VOLTAREN) 1 % GEL APPLY 2 GRAMS TOPICALLY 2 (TWO) TIMES DAILY     ibandronate (BONIVA) 150 MG tablet  PLEASE SEE ATTACHED FOR DETAILED DIRECTIONS     Multiple Vitamins-Minerals (CENTRUM SILVER PO) Take 1 tablet by mouth daily.     pantoprazole (PROTONIX) 20 MG tablet Take 20 mg by mouth at bedtime.      simvastatin (ZOCOR) 20 MG tablet TAKE 1 TABLET BY MOUTH EVERY DAY AT NIGHT     tizanidine (ZANAFLEX) 2 MG capsule Take 2 mg by mouth 3 (three) times daily.     traMADol-acetaminophen (ULTRACET) 37.5-325 MG tablet Take 1 tablet by mouth every 8 (eight) hours as needed.      No current facility-administered medications for this visit.   Facility-Administered Medications Ordered in Other Visits  Medication Dose Route Frequency Provider Last Rate Last Admin   heparin lock flush 100 unit/mL  500 Units Intravenous Once Grayland Ormond, Kathlene November, MD       heparin lock flush 100 unit/mL  500 Units Intracatheter PRN Grayland Ormond, Kathlene November, MD       heparin lock flush 100 unit/mL  500 Units Intravenous Once Lloyd Huger, MD       sodium chloride flush (NS) 0.9 % injection 10 mL  10 mL Intravenous PRN Lloyd Huger, MD   10 mL at 12/04/17 0901   sodium chloride flush (NS) 0.9 % injection 10 mL  10 mL Intracatheter PRN Lloyd Huger, MD       sodium chloride flush (NS) 0.9 % injection 10 mL  10 mL Intravenous PRN Lloyd Huger, MD   10 mL at 04/24/21 0839   yttrium-90 injection 54.6 millicurie  50.3 millicurie Intravenous Once Chrystal, Eulas Post, MD        OBJECTIVE: Vitals:   04/25/21 1452  BP: 111/84  Pulse: 100  Resp: 17  Temp: 97.7 F (36.5 C)  SpO2: 98%     Body mass index is 21.84 kg/m.    ECOG FS:0 - Asymptomatic  General: Thin, no acute distress. 5-fu pump in place. Eyes: Pink conjunctiva, anicteric sclera. HEENT: Normocephalic, moist mucous membranes. Lungs: No audible wheezing or coughing. Heart: Regular rate and rhythm. Abdomen: Soft, nontender, no obvious distention. Musculoskeletal: No edema, cyanosis, or clubbing. Neuro: Alert, answering all questions  appropriately. Cranial nerves grossly intact. Skin: No rashes or petechiae noted. Psych: Normal affect.  LAB RESULTS:  Lab Results  Component Value Date   NA 140 04/24/2021   K 3.8 04/24/2021   CL 104 04/24/2021   CO2 25 04/24/2021   GLUCOSE 94 04/24/2021   BUN 12 04/24/2021   CREATININE 0.60 04/24/2021   CALCIUM 9.3 04/24/2021   PROT 7.4 04/24/2021   ALBUMIN 4.1 04/24/2021   AST 34 04/24/2021   ALT 22 04/24/2021   ALKPHOS 70 04/24/2021   BILITOT 0.5 04/24/2021   GFRNONAA >60 04/24/2021   GFRAA >60 06/28/2020    Lab Results  Component Value Date   WBC 6.8 04/24/2021   NEUTROABS 3.9 04/24/2021   HGB 13.1 04/24/2021   HCT 39.6 04/24/2021   MCV 103.4 (H) 04/24/2021   PLT 125 (L) 04/24/2021     STUDIES:  No results found.    ASSESSMENT: Stage IV rectal cancer, history of follicular lymphoma. PLAN:    1.  Stage IV rectal cancer: Biopsy confirmed rectal cancer.  MRI completed at [facility] on October 11, 2020 revealed extension of the tumor beyond the wall into the rectovaginal recess with suspected invasion of the vaginal wall posteriorly.  Tumor also appears to involve the internal anal sphincter.  There are also numerous prominent presacral and mesorectal lymph nodes highly suspicious for malignancy.  PET scan results from November 17, 2020 reviewed independently with 3 hypermetabolic right lower lobe lung nodules consistent with pulmonary metastasis.  Repeat PET scan on March 01, 2021 reviewed independently with significant improvement of pulmonary nodules as well as known rectal primary.  Patient was discussed at tumor board and consensus was to refer to radiation oncology for consideration of concurrent chemotherapy and XRT.  She completed cycle 8 of FOLFOX on Mar 29, 2021.  Continue cycle 1 of 5-FU pump along with daily XRT.  Return to clinic Friday for pump removal and then weekly on Mondays to initiate 5-FU.  Plan to give a total of 5 cycles throughout the duration  of XRT.   2. Recurrent follicular lymphoma: CT scan results from December 23, 2019 reviewed independently with no obvious evidence of recurrent or progressive disease.  PET scan results as above consistent with rectal cancer metastasis and no evidence of lymphoma.   Patient received Zevalin on July 30, 2017 with not much therapeutic effect.  She subsequently underwent cycles 5 of R-CHOP chemotherapy with Neulasta support completing on December 12, 2017.  Patient continues to be in complete remission.  3.  Hypokalemia: Resolved. 4.  Neutropenia: Resolved. 5.  Thrombocytopenia: Improved.  Patient's platelet count is 125 today.   6.  Covid infection: Resolved.  Patient is now asymptomatic.  Patient expressed understanding and was in agreement with this plan. She also understands that She can call clinic at any time with any questions, concerns, or complaints.   Cancer Staging 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 [facility]) Staging form: Lymphoid Neoplasms, AJCC 6th Edition - Clinical stage from 08/27/2016: Stage IIE (lymphoma only) - Signed by Lloyd Huger, MD on 08/27/2016  Rectal cancer [facility]) Staging form: Colon and Rectum, AJCC 8th Edition - Clinical: Stage IVA Larwance Sachs, Noelle.Marry) - Signed by Lloyd Huger, MD on 11/02/2020   Lloyd Huger, MD   04/27/2021 8:01 PM

## 2021-04-26 ENCOUNTER — Ambulatory Visit
Admission: RE | Admit: 2021-04-26 | Discharge: 2021-04-26 | Disposition: A | Discharge status: Home / Self Care | Payer: Medicare Other | Source: Ambulatory Visit | Attending: Radiation Oncology | Admitting: Radiation Oncology

## 2021-04-26 DIAGNOSIS — Z510 Encounter for antineoplastic radiation therapy: Secondary | ICD-10-CM | POA: Diagnosis not present

## 2021-04-27 ENCOUNTER — Encounter: Payer: Self-pay | Admitting: Oncology

## 2021-04-27 ENCOUNTER — Ambulatory Visit
Admission: RE | Admit: 2021-04-27 | Discharge: 2021-04-27 | Disposition: A | Discharge status: Home / Self Care | Payer: Medicare Other | Source: Ambulatory Visit | Attending: Radiation Oncology | Admitting: Radiation Oncology

## 2021-04-27 DIAGNOSIS — Z510 Encounter for antineoplastic radiation therapy: Secondary | ICD-10-CM | POA: Diagnosis not present

## 2021-04-28 ENCOUNTER — Inpatient Hospital Stay: Payer: Medicare Other

## 2021-04-28 ENCOUNTER — Other Ambulatory Visit: Payer: Self-pay

## 2021-04-28 ENCOUNTER — Ambulatory Visit
Admission: RE | Admit: 2021-04-28 | Discharge: 2021-04-28 | Disposition: A | Discharge status: Home / Self Care | Payer: Medicare Other | Source: Ambulatory Visit | Attending: Radiation Oncology | Admitting: Radiation Oncology

## 2021-04-28 DIAGNOSIS — Z510 Encounter for antineoplastic radiation therapy: Secondary | ICD-10-CM | POA: Diagnosis not present

## 2021-04-28 DIAGNOSIS — Z5111 Encounter for antineoplastic chemotherapy: Secondary | ICD-10-CM | POA: Diagnosis not present

## 2021-04-28 DIAGNOSIS — C20 Malignant neoplasm of rectum: Secondary | ICD-10-CM

## 2021-04-28 MED ORDER — HEPARIN SOD (PORK) LOCK FLUSH 100 UNIT/ML IV SOLN
500.0000 [IU] | Freq: Once | INTRAVENOUS | Status: AC | PRN
  Administered 2021-04-28: 500 [IU]
  Filled 2021-04-28: qty 5

## 2021-04-28 MED ORDER — SODIUM CHLORIDE 0.9% FLUSH
10.0000 mL | INTRAVENOUS | Status: DC | PRN
  Administered 2021-04-28: 10 mL
  Filled 2021-04-28: qty 10

## 2021-04-28 MED ORDER — HEPARIN SOD (PORK) LOCK FLUSH 100 UNIT/ML IV SOLN
INTRAVENOUS | Status: AC
  Filled 2021-04-28: qty 5

## 2021-04-29 NOTE — Progress Notes (Signed)
[facility]  [phone] [phone]  ID: Veronica Boyd OB: Jan 07, 1946  MR#: [id number]  QPY#:[id number]  Patient Care Team: Tracie Harrier, MD as PCP - General (Internal Medicine) Lloyd Huger, MD as Consulting Physician (Oncology) Leonie Green, MD as Referring Physician (Surgery) [name], MD as Referring Physician (Radiation Oncology) Jacquelin Hawking, NP as Nurse Practitioner (Oncology) Clent Jacks, RN as Oncology Nurse Navigator   CHIEF COMPLAINT: Stage IV rectal cancer, history of follicular lymphoma.  INTERVAL HISTORY: Patient returns to clinic today for further evaluation and continuation of her 5-FU pump.  She initiated cycle 2 yesterday and is tolerating her treatments well without significant side effects.  She currently feels well and is asymptomatic.  She does not complain of any weakness or fatigue.  She has a good appetite and denies weight loss.  She has no neurologic complaints.  She denies any recent fevers or illnesses.  She denies any chest pain, shortness of breath, cough, or hemoptysis. She denies any nausea, vomiting, constipation, or diarrhea. She has no urinary complaints.  Patient offers no specific complaints today.  REVIEW OF SYSTEMS:   Review of Systems  Constitutional: Negative.  Negative for fever, malaise/fatigue and weight loss.  Respiratory: Negative.  Negative for cough, hemoptysis and shortness of breath.   Cardiovascular: Negative.  Negative for chest pain and leg swelling.  Gastrointestinal: Negative.  Negative for abdominal pain, blood in stool, melena, nausea and vomiting.  Genitourinary: Negative.  Negative for dysuria.  Musculoskeletal: Negative.  Negative for back pain, joint pain and neck pain.  Skin: Negative.  Negative for rash.  Neurological: Negative.  Negative for sensory change, focal weakness, weakness and headaches.  Psychiatric/Behavioral: Negative.  The patient  is not nervous/anxious and does not have insomnia.    As per HPI. Otherwise, a complete review of systems is negative.   PAST MEDICAL HISTORY: Past Medical History:  Diagnosis Date   Arthritis    Cataract    bilateral   Chicken pox    Colon polyp    Follicular lymphoma ([place]) 08/2016   lymph nodes    Hyperlipidemia    Osteoporosis     PAST SURGICAL HISTORY: Past Surgical History:  Procedure Laterality Date   AXILLARY LYMPH NODE DISSECTION Right 08/21/2016   Procedure: AXILLARY LYMPH NODE excision;  Surgeon: Leonie Green, MD;  Location: [facility] ORS;  Service: General;  Laterality: Right;   CATARACT EXTRACTION, BILATERAL Bilateral    COLONOSCOPY N/A 09/19/2020   Procedure: COLONOSCOPY;  Surgeon: Lesly Rubenstein, MD;  Location: [facility] ENDOSCOPY;  Service: Endoscopy;  Laterality: N/A;   EYE SURGERY     JOINT REPLACEMENT Left    PORTA CATH INSERTION N/A 09/16/2017   Procedure: PORTA CATH INSERTION;  Surgeon: Algernon Huxley, MD;  Location: [facility];  Service: Cardiovascular;  Laterality: N/A;   TONSILLECTOMY     TOTAL HIP ARTHROPLASTY Left 1992    FAMILY HISTORY: Family History  Problem Relation Age of Onset   Diabetes Sister    Lung cancer Brother    Diabetes Brother    Basal cell carcinoma Daughter    Breast cancer Paternal Aunt     ADVANCED DIRECTIVES (Y/N):  N  HEALTH MAINTENANCE: Social History   Tobacco Use   Smoking status: Never   Smokeless tobacco: Never  Vaping Use   Vaping Use: Never used  Substance Use Topics   Alcohol use: No   Drug use: No  Colonoscopy:  PAP:  Bone density:  Lipid panel:  No Known Allergies  Current Outpatient Medications  Medication Sig Dispense Refill   alendronate (FOSAMAX) 70 MG tablet TAKE 1 TABLET EVERY 7 DAYS TAKE WITH A FULL GLASS OF WATER. DO NOT LIE DOWN FOR THE NEXT 30 MIN.  3   diclofenac sodium (VOLTAREN) 1 % GEL APPLY 2 GRAMS TOPICALLY 2 (TWO) TIMES DAILY     ibandronate (BONIVA) 150 MG  tablet PLEASE SEE ATTACHED FOR DETAILED DIRECTIONS     Multiple Vitamins-Minerals (CENTRUM SILVER PO) Take 1 tablet by mouth daily.     pantoprazole (PROTONIX) 20 MG tablet Take 20 mg by mouth at bedtime.      simvastatin (ZOCOR) 20 MG tablet TAKE 1 TABLET BY MOUTH EVERY DAY AT NIGHT     tizanidine (ZANAFLEX) 2 MG capsule Take 2 mg by mouth 3 (three) times daily.     traMADol-acetaminophen (ULTRACET) 37.5-325 MG tablet Take 1 tablet by mouth every 8 (eight) hours as needed.      No current facility-administered medications for this visit.   Facility-Administered Medications Ordered in Other Visits  Medication Dose Route Frequency Provider Last Rate Last Admin   heparin lock flush 100 unit/mL  500 Units Intravenous Once Grayland Ormond, Kathlene November, MD       heparin lock flush 100 unit/mL  500 Units Intracatheter PRN Grayland Ormond, Kathlene November, MD       heparin lock flush 100 unit/mL  500 Units Intravenous Once Lloyd Huger, MD       sodium chloride flush (NS) 0.9 % injection 10 mL  10 mL Intravenous PRN Lloyd Huger, MD   10 mL at 12/04/17 0901   sodium chloride flush (NS) 0.9 % injection 10 mL  10 mL Intracatheter PRN Lloyd Huger, MD       sodium chloride flush (NS) 0.9 % injection 10 mL  10 mL Intravenous PRN Lloyd Huger, MD   10 mL at 04/24/21 0839   sodium chloride flush (NS) 0.9 % injection 10 mL  10 mL Intracatheter PRN Lloyd Huger, MD   10 mL at 05/05/21 1410   yttrium-90 injection 77.8 millicurie  24.2 millicurie Intravenous Once Chrystal, Eulas Post, MD        OBJECTIVE: Vitals:   05/04/21 1339  BP: 116/70  Pulse: (!) 101  Resp: 16  Temp: 99.1 F (37.3 C)     Body mass index is 21.8 kg/m.    ECOG FS:0 - Asymptomatic  General: Thin, no acute distress.  5-FU pump in place. Eyes: Pink conjunctiva, anicteric sclera. HEENT: Normocephalic, moist mucous membranes. Lungs: No audible wheezing or coughing. Heart: Regular rate and rhythm. Abdomen: Soft, nontender,  no obvious distention. Musculoskeletal: No edema, cyanosis, or clubbing. Neuro: Alert, answering all questions appropriately. Cranial nerves grossly intact. Skin: No rashes or petechiae noted. Psych: Normal affect.   LAB RESULTS:  Lab Results  Component Value Date   NA 138 05/01/2021   K 3.3 (L) 05/01/2021   CL 104 05/01/2021   CO2 26 05/01/2021   GLUCOSE 83 05/01/2021   BUN 11 05/01/2021   CREATININE 0.53 05/01/2021   CALCIUM 8.7 (L) 05/01/2021   PROT 6.8 05/01/2021   ALBUMIN 3.9 05/01/2021   AST 30 05/01/2021   ALT 19 05/01/2021   ALKPHOS 72 05/01/2021   BILITOT 0.4 05/01/2021   GFRNONAA >60 05/01/2021   GFRAA >60 06/28/2020    Lab Results  Component Value Date   WBC 5.3 05/01/2021  NEUTROABS 3.2 05/01/2021   HGB 12.2 05/01/2021   HCT 36.9 05/01/2021   MCV 102.8 (H) 05/01/2021   PLT 118 (L) 05/01/2021     STUDIES: No results found.    ASSESSMENT: Stage IV rectal cancer, history of follicular lymphoma. PLAN:    1.  Stage IV rectal cancer: Biopsy confirmed rectal cancer.  [facility] on October 11, 2020 revealed extension of the tumor beyond the wall into the rectovaginal recess with suspected invasion of the vaginal wall posteriorly.  Tumor also appears to involve the internal anal sphincter.  There are also numerous prominent presacral and mesorectal lymph nodes highly suspicious for malignancy.  PET scan results from November 17, 2020 reviewed independently with 3 hypermetabolic right lower lobe lung nodules consistent with pulmonary metastasis.  Repeat PET scan on March 01, 2021 reviewed independently with significant improvement of pulmonary nodules as well as known rectal primary.  Patient was discussed at tumor board and consensus was to refer to radiation oncology for consideration of concurrent chemotherapy and XRT.  She completed cycle 8 of FOLFOX on Mar 29, 2021.  Continue cycle 2 of 5-FU pump along with daily XRT.  Return to clinic  Friday for pump removal.  Patient has been instructed to keep her previously scheduled follow-up appointments for 5-FU pump and XRT.  Return to clinic in approximately 2 weeks for further evaluation. Plan to give a total of 5 cycles throughout the duration of XRT.   2. Recurrent follicular lymphoma: CT scan results from December 23, 2019 reviewed independently with no obvious evidence of recurrent or progressive disease.  PET scan results as above consistent with rectal cancer metastasis and no evidence of lymphoma.   Patient received Zevalin on July 30, 2017 with not much therapeutic effect.  She subsequently underwent cycles 5 of R-CHOP chemotherapy with Neulasta support completing on December 12, 2017.  Patient continues to be in complete remission.  3.  Hypokalemia: Mild.  Continue oral potassium supplementation. 4.  Neutropenia: Resolved. 5.  Thrombocytopenia: Mild.  Patient's platelet count has trended down slightly to 118. 6.  Covid infection: Resolved.  Patient is now asymptomatic.  Patient expressed understanding and was in agreement with this plan. She also understands that She can call clinic at any time with any questions, concerns, or complaints.   Cancer Staging 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 [facility]) Staging form: Lymphoid Neoplasms, AJCC 6th Edition - Clinical stage from 08/27/2016: Stage IIE (lymphoma only) - Signed by Lloyd Huger, MD on 08/27/2016  Rectal cancer [facility]) Staging form: Colon and Rectum, AJCC 8th Edition - Clinical: Stage IVA Veronica Boyd, Veronica Boyd) - Signed by Lloyd Huger, MD on 11/02/2020   Lloyd Huger, MD   05/05/2021 3:44 PM

## 2021-05-01 ENCOUNTER — Inpatient Hospital Stay: Payer: Medicare Other

## 2021-05-01 ENCOUNTER — Ambulatory Visit: Payer: Medicare Other

## 2021-05-01 ENCOUNTER — Ambulatory Visit
Admission: RE | Admit: 2021-05-01 | Discharge: 2021-05-01 | Disposition: A | Discharge status: Home / Self Care | Payer: Medicare Other | Source: Ambulatory Visit | Attending: Radiation Oncology | Admitting: Radiation Oncology

## 2021-05-01 ENCOUNTER — Other Ambulatory Visit: Payer: Self-pay | Admitting: Oncology

## 2021-05-01 VITALS — BP 121/60 | HR 86 | Temp 98.6°F | Resp 16

## 2021-05-01 DIAGNOSIS — C20 Malignant neoplasm of rectum: Secondary | ICD-10-CM

## 2021-05-01 DIAGNOSIS — Z5111 Encounter for antineoplastic chemotherapy: Secondary | ICD-10-CM | POA: Diagnosis not present

## 2021-05-01 DIAGNOSIS — C8208 Follicular lymphoma grade I, lymph nodes of multiple sites: Secondary | ICD-10-CM

## 2021-05-01 DIAGNOSIS — Z510 Encounter for antineoplastic radiation therapy: Secondary | ICD-10-CM | POA: Diagnosis not present

## 2021-05-01 LAB — COMPREHENSIVE METABOLIC PANEL
ALT: 19 U/L (ref 0–44)
AST: 30 U/L (ref 15–41)
Albumin: 3.9 g/dL (ref 3.5–5.0)
Alkaline Phosphatase: 72 U/L (ref 38–126)
Anion gap: 8 (ref 5–15)
BUN: 11 mg/dL (ref 8–23)
CO2: 26 mmol/L (ref 22–32)
Calcium: 8.7 mg/dL — ABNORMAL LOW (ref 8.9–10.3)
Chloride: 104 mmol/L (ref 98–111)
Creatinine, Ser: 0.53 mg/dL (ref 0.44–1.00)
GFR, Estimated: >60 mL/min (ref >60)
Glucose, Bld: 83 mg/dL (ref 70–99)
Potassium: 3.3 mmol/L — ABNORMAL LOW (ref 3.5–5.1)
Sodium: 138 mmol/L (ref 135–145)
Total Bilirubin: 0.4 mg/dL (ref 0.3–1.2)
Total Protein: 6.8 g/dL (ref 6.5–8.1)

## 2021-05-01 LAB — CBC WITH DIFFERENTIAL/PLATELET
Abs Immature Granulocytes: 0.03 10*3/uL (ref 0.00–0.07)
Basophils Absolute: 0 10*3/uL (ref 0.0–0.1)
Basophils Relative: 0 %
Eosinophils Absolute: 0 10*3/uL (ref 0.0–0.5)
Eosinophils Relative: 1 %
HCT: 36.9 % (ref 36.0–46.0)
Hemoglobin: 12.2 g/dL (ref 12.0–15.0)
Immature Granulocytes: 1 %
Lymphocytes Relative: 20 %
Lymphs Abs: 1.1 10*3/uL (ref 0.7–4.0)
MCH: 34 pg (ref 26.0–34.0)
MCHC: 33.1 g/dL (ref 30.0–36.0)
MCV: 102.8 fL — ABNORMAL HIGH (ref 80.0–100.0)
Monocytes Absolute: 0.9 10*3/uL (ref 0.1–1.0)
Monocytes Relative: 18 %
Neutro Abs: 3.2 10*3/uL (ref 1.7–7.7)
Neutrophils Relative %: 60 %
Platelets: 118 10*3/uL — ABNORMAL LOW (ref 150–400)
RBC: 3.59 MIL/uL — ABNORMAL LOW (ref 3.87–5.11)
RDW: 14.1 % (ref 11.5–15.5)
WBC: 5.3 10*3/uL (ref 4.0–10.5)
nRBC: 0 % (ref 0.0–0.2)

## 2021-05-01 MED ORDER — SODIUM CHLORIDE 0.9 % IV SOLN
Freq: Once | INTRAVENOUS | Status: DC
  Filled 2021-05-01: qty 250

## 2021-05-01 MED ORDER — SODIUM CHLORIDE 0.9 % IV SOLN
225.0000 mg/m2/d | INTRAVENOUS | Status: DC
  Administered 2021-05-01: 1550 mg via INTRAVENOUS
  Filled 2021-05-01: qty 31

## 2021-05-01 NOTE — Patient Instructions (Addendum)
[facility]COL[facility]  Discharge Instructions: Thank you for choosing [facility] to provide your oncology and hematology care.  If you have a lab appointment with the [facility], please go directly to the [facility] and check in at the registration area.  Wear comfortable clothing and clothing appropriate for easy access to any Portacath or PICC line.   We strive to give you quality time with your provider. You may need to reschedule your appointment if you arrive late (15 or more minutes).  Arriving late affects you and other patients whose appointments are after yours.  Also, if you miss three or more appointments without notifying the office, you may be dismissed from the clinic at the provider's discretion.      For prescription refill requests, have your pharmacy contact our office and allow 72 hours for refills to be completed.    Today you received the following chemotherapy and/or immunotherapy agents: 5FU      To help prevent nausea and vomiting after your treatment, we encourage you to take your nausea medication as directed.  BELOW ARE SYMPTOMS THAT SHOULD BE REPORTED IMMEDIATELY: *FEVER GREATER THAN 100.4 F (38 C) OR HIGHER *CHILLS OR SWEATING *NAUSEA AND VOMITING THAT IS NOT CONTROLLED WITH YOUR NAUSEA MEDICATION *UNUSUAL SHORTNESS OF BREATH *UNUSUAL BRUISING OR BLEEDING *URINARY PROBLEMS (pain or burning when urinating, or frequent urination) *BOWEL PROBLEMS (unusual diarrhea, constipation, pain near the anus) TENDERNESS IN MOUTH AND THROAT WITH OR WITHOUT PRESENCE OF ULCERS (sore throat, sores in mouth, or a toothache) UNUSUAL RASH, SWELLING OR PAIN  UNUSUAL VAGINAL DISCHARGE OR ITCHING   Items with * indicate a potential emergency and should be followed up as soon as possible or go to the Emergency Department if any problems should occur.  Please show the CHEMOTHERAPY ALERT CARD or IMMUNOTHERAPY ALERT CARD at check-in to the  Emergency Department and triage nurse.  Should you have questions after your visit or need to cancel or reschedule your appointment, please contact [facility]  [phone] and follow the prompts.  Office hours are 8:00 a.m. to 4:30 p.m. Monday - Friday. Please note that voicemails left after 4:00 p.m. may not be returned until the following business day.  We are closed weekends and major holidays. You have access to a nurse at all times for urgent questions. Please call the main number to the clinic [phone] and follow the prompts.  For any non-urgent questions, you may also contact your provider using MyChart. We now offer e-Visits for anyone 46 and older to request care online for non-urgent symptoms. For details visit [email or web address].   Also download the MyChart app! Go to the app store, search "MyChart", open the app, select [facility], and log in with your MyChart username and password.  Due to Covid, a mask is required upon entering the hospital/clinic. If you do not have a mask, one will be given to you upon arrival. For doctor visits, patients may have 1 support person aged 4 or older with them. For treatment visits, patients cannot have anyone with them due to current Covid guidelines and our immunocompromised population. hypo

## 2021-05-01 NOTE — Progress Notes (Signed)
Dr. Grayland Ormond made aware of K 3.3. Patient educated on foods high in potassium.

## 2021-05-02 ENCOUNTER — Ambulatory Visit
Admission: RE | Admit: 2021-05-02 | Discharge: 2021-05-02 | Disposition: A | Discharge status: Home / Self Care | Payer: Medicare Other | Source: Ambulatory Visit | Attending: Radiation Oncology | Admitting: Radiation Oncology

## 2021-05-02 ENCOUNTER — Ambulatory Visit: Payer: Medicare Other

## 2021-05-02 DIAGNOSIS — Z510 Encounter for antineoplastic radiation therapy: Secondary | ICD-10-CM | POA: Diagnosis not present

## 2021-05-03 ENCOUNTER — Ambulatory Visit
Admission: RE | Admit: 2021-05-03 | Discharge: 2021-05-03 | Disposition: A | Discharge status: Home / Self Care | Payer: Medicare Other | Source: Ambulatory Visit | Attending: Radiation Oncology | Admitting: Radiation Oncology

## 2021-05-03 DIAGNOSIS — Z510 Encounter for antineoplastic radiation therapy: Secondary | ICD-10-CM | POA: Diagnosis not present

## 2021-05-04 ENCOUNTER — Ambulatory Visit
Admission: RE | Admit: 2021-05-04 | Discharge: 2021-05-04 | Disposition: A | Discharge status: Home / Self Care | Payer: Medicare Other | Source: Ambulatory Visit | Attending: Radiation Oncology | Admitting: Radiation Oncology

## 2021-05-04 ENCOUNTER — Encounter: Payer: Self-pay | Admitting: Oncology

## 2021-05-04 ENCOUNTER — Inpatient Hospital Stay (HOSPITAL_BASED_OUTPATIENT_CLINIC_OR_DEPARTMENT_OTHER): Payer: Medicare Other | Admitting: Oncology

## 2021-05-04 VITALS — BP 116/70 | HR 101 | Temp 99.1°F | Resp 16 | Wt 104.3 lb

## 2021-05-04 DIAGNOSIS — Z510 Encounter for antineoplastic radiation therapy: Secondary | ICD-10-CM | POA: Diagnosis not present

## 2021-05-04 DIAGNOSIS — Z5111 Encounter for antineoplastic chemotherapy: Secondary | ICD-10-CM | POA: Diagnosis not present

## 2021-05-04 DIAGNOSIS — C20 Malignant neoplasm of rectum: Secondary | ICD-10-CM

## 2021-05-04 NOTE — Progress Notes (Signed)
Patient denies new problems/concerns today.   °

## 2021-05-05 ENCOUNTER — Encounter: Payer: Self-pay | Admitting: Oncology

## 2021-05-05 ENCOUNTER — Inpatient Hospital Stay: Payer: Medicare Other | Attending: Nurse Practitioner

## 2021-05-05 ENCOUNTER — Ambulatory Visit
Admission: RE | Admit: 2021-05-05 | Discharge: 2021-05-05 | Disposition: A | Discharge status: Home / Self Care | Payer: Medicare Other | Source: Ambulatory Visit | Attending: Radiation Oncology | Admitting: Radiation Oncology

## 2021-05-05 ENCOUNTER — Other Ambulatory Visit: Payer: Self-pay

## 2021-05-05 DIAGNOSIS — Z510 Encounter for antineoplastic radiation therapy: Secondary | ICD-10-CM | POA: Insufficient documentation

## 2021-05-05 DIAGNOSIS — Z5111 Encounter for antineoplastic chemotherapy: Secondary | ICD-10-CM | POA: Diagnosis present

## 2021-05-05 DIAGNOSIS — E876 Hypokalemia: Secondary | ICD-10-CM | POA: Diagnosis not present

## 2021-05-05 DIAGNOSIS — Z452 Encounter for adjustment and management of vascular access device: Secondary | ICD-10-CM | POA: Diagnosis not present

## 2021-05-05 DIAGNOSIS — C20 Malignant neoplasm of rectum: Secondary | ICD-10-CM | POA: Insufficient documentation

## 2021-05-05 MED ORDER — HEPARIN SOD (PORK) LOCK FLUSH 100 UNIT/ML IV SOLN
500.0000 [IU] | Freq: Once | INTRAVENOUS | Status: AC | PRN
  Administered 2021-05-05: 500 [IU]
  Filled 2021-05-05: qty 5

## 2021-05-05 MED ORDER — SODIUM CHLORIDE 0.9% FLUSH
10.0000 mL | INTRAVENOUS | Status: DC | PRN
  Administered 2021-05-05: 10 mL
  Filled 2021-05-05: qty 10

## 2021-05-05 MED ORDER — HEPARIN SOD (PORK) LOCK FLUSH 100 UNIT/ML IV SOLN
INTRAVENOUS | Status: AC
  Filled 2021-05-05: qty 5

## 2021-05-09 ENCOUNTER — Inpatient Hospital Stay: Payer: Medicare Other

## 2021-05-09 ENCOUNTER — Ambulatory Visit
Admission: RE | Admit: 2021-05-09 | Discharge: 2021-05-09 | Disposition: A | Discharge status: Home / Self Care | Payer: Medicare Other | Source: Ambulatory Visit | Attending: Radiation Oncology | Admitting: Radiation Oncology

## 2021-05-09 ENCOUNTER — Other Ambulatory Visit: Payer: Self-pay | Admitting: *Deleted

## 2021-05-09 VITALS — BP 146/74 | HR 80 | Temp 97.8°F | Resp 18 | Wt 104.8 lb

## 2021-05-09 DIAGNOSIS — Z5111 Encounter for antineoplastic chemotherapy: Secondary | ICD-10-CM | POA: Diagnosis not present

## 2021-05-09 DIAGNOSIS — Z95828 Presence of other vascular implants and grafts: Secondary | ICD-10-CM

## 2021-05-09 DIAGNOSIS — C20 Malignant neoplasm of rectum: Secondary | ICD-10-CM

## 2021-05-09 DIAGNOSIS — Z510 Encounter for antineoplastic radiation therapy: Secondary | ICD-10-CM | POA: Diagnosis not present

## 2021-05-09 LAB — CBC WITH DIFFERENTIAL/PLATELET
Abs Immature Granulocytes: 0.01 10*3/uL (ref 0.00–0.07)
Basophils Absolute: 0 10*3/uL (ref 0.0–0.1)
Basophils Relative: 0 %
Eosinophils Absolute: 0.1 10*3/uL (ref 0.0–0.5)
Eosinophils Relative: 3 %
HCT: 36.4 % (ref 36.0–46.0)
Hemoglobin: 12 g/dL (ref 12.0–15.0)
Immature Granulocytes: 0 %
Lymphocytes Relative: 17 %
Lymphs Abs: 0.8 10*3/uL (ref 0.7–4.0)
MCH: 34.3 pg — ABNORMAL HIGH (ref 26.0–34.0)
MCHC: 33 g/dL (ref 30.0–36.0)
MCV: 104 fL — ABNORMAL HIGH (ref 80.0–100.0)
Monocytes Absolute: 0.9 10*3/uL (ref 0.1–1.0)
Monocytes Relative: 21 %
Neutro Abs: 2.6 10*3/uL (ref 1.7–7.7)
Neutrophils Relative %: 59 %
Platelets: 104 10*3/uL — ABNORMAL LOW (ref 150–400)
RBC: 3.5 MIL/uL — ABNORMAL LOW (ref 3.87–5.11)
RDW: 14.6 % (ref 11.5–15.5)
WBC: 4.5 10*3/uL (ref 4.0–10.5)
nRBC: 0 % (ref 0.0–0.2)

## 2021-05-09 LAB — COMPREHENSIVE METABOLIC PANEL
ALT: 16 U/L (ref 0–44)
AST: 25 U/L (ref 15–41)
Albumin: 3.9 g/dL (ref 3.5–5.0)
Alkaline Phosphatase: 60 U/L (ref 38–126)
Anion gap: 7 (ref 5–15)
BUN: 10 mg/dL (ref 8–23)
CO2: 28 mmol/L (ref 22–32)
Calcium: 9 mg/dL (ref 8.9–10.3)
Chloride: 101 mmol/L (ref 98–111)
Creatinine, Ser: 0.59 mg/dL (ref 0.44–1.00)
GFR, Estimated: >60 mL/min (ref >60)
Glucose, Bld: 82 mg/dL (ref 70–99)
Potassium: 3.9 mmol/L (ref 3.5–5.1)
Sodium: 136 mmol/L (ref 135–145)
Total Bilirubin: 0.6 mg/dL (ref 0.3–1.2)
Total Protein: 6.9 g/dL (ref 6.5–8.1)

## 2021-05-09 MED ORDER — SODIUM CHLORIDE 0.9% FLUSH
10.0000 mL | INTRAVENOUS | Status: DC | PRN
Start: 2021-05-09 — End: 2021-05-09
  Administered 2021-05-09: 10 mL via INTRAVENOUS
  Filled 2021-05-09: qty 10

## 2021-05-09 MED ORDER — SODIUM CHLORIDE 0.9 % IV SOLN
225.0000 mg/m2/d | INTRAVENOUS | Status: DC
  Administered 2021-05-09: 1550 mg via INTRAVENOUS
  Filled 2021-05-09: qty 31

## 2021-05-09 MED ORDER — SODIUM CHLORIDE 0.9 % IV SOLN
225.0000 mg/m2/d | INTRAVENOUS | Status: DC
  Filled 2021-05-09: qty 31

## 2021-05-09 MED ORDER — SODIUM CHLORIDE 0.9% FLUSH
10.0000 mL | INTRAVENOUS | Status: DC | PRN
  Administered 2021-05-09: 10 mL
  Filled 2021-05-09: qty 10

## 2021-05-09 NOTE — Patient Instructions (Signed)
[facility]   Discharge Instructions: Thank you for choosing [facility] to provide your oncology and hematology care.  If you have a lab appointment with the [facility], please go directly to the [facility] and check in at the registration area.  Wear comfortable clothing and clothing appropriate for easy access to any Portacath or PICC line.   We strive to give you quality time with your provider. You may need to reschedule your appointment if you arrive late (15 or more minutes).  Arriving late affects you and other patients whose appointments are after yours.  Also, if you miss three or more appointments without notifying the office, you may be dismissed from the clinic at the provider's discretion.      For prescription refill requests, have your pharmacy contact our office and allow 72 hours for refills to be completed.    Today you received the following chemotherapy and/or immunotherapy agents: Fluorouracil.      To help prevent nausea and vomiting after your treatment, we encourage you to take your nausea medication as directed.  BELOW ARE SYMPTOMS THAT SHOULD BE REPORTED IMMEDIATELY: *FEVER GREATER THAN 100.4 F (38 C) OR HIGHER *CHILLS OR SWEATING *NAUSEA AND VOMITING THAT IS NOT CONTROLLED WITH YOUR NAUSEA MEDICATION *UNUSUAL SHORTNESS OF BREATH *UNUSUAL BRUISING OR BLEEDING *URINARY PROBLEMS (pain or burning when urinating, or frequent urination) *BOWEL PROBLEMS (unusual diarrhea, constipation, pain near the anus) TENDERNESS IN MOUTH AND THROAT WITH OR WITHOUT PRESENCE OF ULCERS (sore throat, sores in mouth, or a toothache) UNUSUAL RASH, SWELLING OR PAIN  UNUSUAL VAGINAL DISCHARGE OR ITCHING   Items with * indicate a potential emergency and should be followed up as soon as possible or go to the Emergency Department if any problems should occur.  Please show the CHEMOTHERAPY ALERT CARD or IMMUNOTHERAPY ALERT CARD at  check-in to the Emergency Department and triage nurse.  Should you have questions after your visit or need to cancel or reschedule your appointment, please contact [facility]  [phone] and follow the prompts.  Office hours are 8:00 a.m. to 4:30 p.m. Monday - Friday. Please note that voicemails left after 4:00 p.m. may not be returned until the following business day.  We are closed weekends and major holidays. You have access to a nurse at all times for urgent questions. Please call the main number to the clinic [phone] and follow the prompts.  For any non-urgent questions, you may also contact your provider using MyChart. We now offer e-Visits for anyone 82 and older to request care online for non-urgent symptoms. For details visit [email or web address].   Also download the MyChart app! Go to the app store, search "MyChart", open the app, select [facility], and log in with your MyChart username and password.  Due to Covid, a mask is required upon entering the hospital/clinic. If you do not have a mask, one will be given to you upon arrival. For doctor visits, patients may have 1 support person aged 80 or older with them. For treatment visits, patients cannot have anyone with them due to current Covid guidelines and our immunocompromised population.

## 2021-05-10 ENCOUNTER — Ambulatory Visit
Admission: RE | Admit: 2021-05-10 | Discharge: 2021-05-10 | Disposition: A | Discharge status: Home / Self Care | Payer: Medicare Other | Source: Ambulatory Visit | Attending: Radiation Oncology | Admitting: Radiation Oncology

## 2021-05-10 DIAGNOSIS — Z510 Encounter for antineoplastic radiation therapy: Secondary | ICD-10-CM | POA: Diagnosis not present

## 2021-05-11 ENCOUNTER — Ambulatory Visit
Admission: RE | Admit: 2021-05-11 | Discharge: 2021-05-11 | Disposition: A | Discharge status: Home / Self Care | Payer: Medicare Other | Source: Ambulatory Visit | Attending: Radiation Oncology | Admitting: Radiation Oncology

## 2021-05-11 DIAGNOSIS — Z510 Encounter for antineoplastic radiation therapy: Secondary | ICD-10-CM | POA: Diagnosis not present

## 2021-05-12 ENCOUNTER — Ambulatory Visit
Admission: RE | Admit: 2021-05-12 | Discharge: 2021-05-12 | Disposition: A | Discharge status: Home / Self Care | Payer: Medicare Other | Source: Ambulatory Visit | Attending: Radiation Oncology | Admitting: Radiation Oncology

## 2021-05-12 ENCOUNTER — Inpatient Hospital Stay: Payer: Medicare Other

## 2021-05-12 DIAGNOSIS — Z5111 Encounter for antineoplastic chemotherapy: Secondary | ICD-10-CM | POA: Diagnosis not present

## 2021-05-12 DIAGNOSIS — Z510 Encounter for antineoplastic radiation therapy: Secondary | ICD-10-CM | POA: Diagnosis not present

## 2021-05-12 DIAGNOSIS — C20 Malignant neoplasm of rectum: Secondary | ICD-10-CM

## 2021-05-12 MED ORDER — HEPARIN SOD (PORK) LOCK FLUSH 100 UNIT/ML IV SOLN
INTRAVENOUS | Status: AC
  Filled 2021-05-12: qty 5

## 2021-05-12 MED ORDER — HEPARIN SOD (PORK) LOCK FLUSH 100 UNIT/ML IV SOLN
500.0000 [IU] | Freq: Once | INTRAVENOUS | Status: AC | PRN
  Administered 2021-05-12: 500 [IU]
  Filled 2021-05-12: qty 5

## 2021-05-12 MED ORDER — SODIUM CHLORIDE 0.9% FLUSH
10.0000 mL | INTRAVENOUS | Status: DC | PRN
  Administered 2021-05-12: 10 mL
  Filled 2021-05-12: qty 10

## 2021-05-15 ENCOUNTER — Inpatient Hospital Stay: Payer: Medicare Other

## 2021-05-15 ENCOUNTER — Other Ambulatory Visit: Payer: Self-pay

## 2021-05-15 ENCOUNTER — Ambulatory Visit
Admission: RE | Admit: 2021-05-15 | Discharge: 2021-05-15 | Disposition: A | Discharge status: Home / Self Care | Payer: Medicare Other | Source: Ambulatory Visit | Attending: Radiation Oncology | Admitting: Radiation Oncology

## 2021-05-15 VITALS — BP 109/64 | HR 87 | Temp 96.7°F | Resp 20 | Wt 101.8 lb

## 2021-05-15 DIAGNOSIS — Z510 Encounter for antineoplastic radiation therapy: Secondary | ICD-10-CM | POA: Diagnosis not present

## 2021-05-15 DIAGNOSIS — C20 Malignant neoplasm of rectum: Secondary | ICD-10-CM

## 2021-05-15 DIAGNOSIS — Z5111 Encounter for antineoplastic chemotherapy: Secondary | ICD-10-CM | POA: Diagnosis not present

## 2021-05-15 LAB — CBC WITH DIFFERENTIAL/PLATELET
Abs Immature Granulocytes: 0.01 10*3/uL (ref 0.00–0.07)
Basophils Absolute: 0 10*3/uL (ref 0.0–0.1)
Basophils Relative: 1 %
Eosinophils Absolute: 0.2 10*3/uL (ref 0.0–0.5)
Eosinophils Relative: 4 %
HCT: 37.4 % (ref 36.0–46.0)
Hemoglobin: 12.3 g/dL (ref 12.0–15.0)
Immature Granulocytes: 0 %
Lymphocytes Relative: 11 %
Lymphs Abs: 0.6 10*3/uL — ABNORMAL LOW (ref 0.7–4.0)
MCH: 34.2 pg — ABNORMAL HIGH (ref 26.0–34.0)
MCHC: 32.9 g/dL (ref 30.0–36.0)
MCV: 103.9 fL — ABNORMAL HIGH (ref 80.0–100.0)
Monocytes Absolute: 1 10*3/uL (ref 0.1–1.0)
Monocytes Relative: 19 %
Neutro Abs: 3.4 10*3/uL (ref 1.7–7.7)
Neutrophils Relative %: 65 %
Platelets: 127 10*3/uL — ABNORMAL LOW (ref 150–400)
RBC: 3.6 MIL/uL — ABNORMAL LOW (ref 3.87–5.11)
RDW: 14.2 % (ref 11.5–15.5)
WBC: 5.2 10*3/uL (ref 4.0–10.5)
nRBC: 0 % (ref 0.0–0.2)

## 2021-05-15 LAB — COMPREHENSIVE METABOLIC PANEL
ALT: 18 U/L (ref 0–44)
AST: 26 U/L (ref 15–41)
Albumin: 4 g/dL (ref 3.5–5.0)
Alkaline Phosphatase: 57 U/L (ref 38–126)
Anion gap: 8 (ref 5–15)
BUN: 12 mg/dL (ref 8–23)
CO2: 27 mmol/L (ref 22–32)
Calcium: 9.3 mg/dL (ref 8.9–10.3)
Chloride: 102 mmol/L (ref 98–111)
Creatinine, Ser: 0.69 mg/dL (ref 0.44–1.00)
GFR, Estimated: >60 mL/min (ref >60)
Glucose, Bld: 88 mg/dL (ref 70–99)
Potassium: 3.9 mmol/L (ref 3.5–5.1)
Sodium: 137 mmol/L (ref 135–145)
Total Bilirubin: 0.5 mg/dL (ref 0.3–1.2)
Total Protein: 7.1 g/dL (ref 6.5–8.1)

## 2021-05-15 MED ORDER — SODIUM CHLORIDE 0.9% FLUSH
10.0000 mL | Freq: Once | INTRAVENOUS | Status: AC
  Administered 2021-05-15: 10 mL via INTRAVENOUS
  Filled 2021-05-15: qty 10

## 2021-05-15 MED ORDER — HEPARIN SOD (PORK) LOCK FLUSH 100 UNIT/ML IV SOLN
500.0000 [IU] | Freq: Once | INTRAVENOUS | Status: AC
Start: 2021-05-15 — End: ?
  Filled 2021-05-15: qty 5

## 2021-05-15 MED ORDER — SODIUM CHLORIDE 0.9 % IV SOLN
225.0000 mg/m2/d | INTRAVENOUS | Status: DC
  Administered 2021-05-15: 1550 mg via INTRAVENOUS
  Filled 2021-05-15: qty 31

## 2021-05-15 NOTE — Progress Notes (Signed)
5FU pump connected to patient and infusing as ordered.  Pt left chemo suite stable and ambulatory.

## 2021-05-15 NOTE — Patient Instructions (Signed)
[facility]  Discharge Instructions: Thank you for choosing [facility] to provide your oncology and hematology care.  If you have a lab appointment with the Cancer Center, please go directly to the Cancer Center and check in at the registration area.  Wear comfortable clothing and clothing appropriate for easy access to any Portacath or PICC line.   We strive to give you quality time with your provider. You may need to reschedule your appointment if you arrive late (15 or more minutes).  Arriving late affects you and other patients whose appointments are after yours.  Also, if you miss three or more appointments without notifying the office, you may be dismissed from the clinic at the provider's discretion.      For prescription refill requests, have your pharmacy contact our office and allow 72 hours for refills to be completed.    Today you received the following chemotherapy and/or immunotherapy agents 5 FU      To help prevent nausea and vomiting after your treatment, we encourage you to take your nausea medication as directed.  BELOW ARE SYMPTOMS THAT SHOULD BE REPORTED IMMEDIATELY: *FEVER GREATER THAN 100.4 F (38 C) OR HIGHER *CHILLS OR SWEATING *NAUSEA AND VOMITING THAT IS NOT CONTROLLED WITH YOUR NAUSEA MEDICATION *UNUSUAL SHORTNESS OF BREATH *UNUSUAL BRUISING OR BLEEDING *URINARY PROBLEMS (pain or burning when urinating, or frequent urination) *BOWEL PROBLEMS (unusual diarrhea, constipation, pain near the anus) TENDERNESS IN MOUTH AND THROAT WITH OR WITHOUT PRESENCE OF ULCERS (sore throat, sores in mouth, or a toothache) UNUSUAL RASH, SWELLING OR PAIN  UNUSUAL VAGINAL DISCHARGE OR ITCHING   Items with * indicate a potential emergency and should be followed up as soon as possible or go to the Emergency Department if any problems should occur.  Please show the CHEMOTHERAPY ALERT CARD or IMMUNOTHERAPY ALERT CARD at check-in to the  Emergency Department and triage nurse.  Should you have questions after your visit or need to cancel or reschedule your appointment, please contact [facility]  [phone] and follow the prompts.  Office hours are 8:00 a.m. to 4:30 p.m. Monday - Friday. Please note that voicemails left after 4:00 p.m. may not be returned until the following business day.  We are closed weekends and major holidays. You have access to a nurse at all times for urgent questions. Please call the main number to the clinic [phone] and follow the prompts.  For any non-urgent questions, you may also contact your provider using MyChart. We now offer e-Visits for anyone 18 and older to request care online for non-urgent symptoms. For details visit [email or web address].   Also download the MyChart app! Go to the app store, search "MyChart", open the app, select [facility], and log in with your MyChart username and password.  Due to Covid, a mask is required upon entering the hospital/clinic. If you do not have a mask, one will be given to you upon arrival. For doctor visits, patients may have 1 support person aged 18 or older with them. For treatment visits, patients cannot have anyone with them due to current Covid guidelines and our immunocompromised population.   Fluorouracil, 5-FU injection What is this medication? FLUOROURACIL, 5-FU (flure oh YOOR a sil) is a chemotherapy drug. It slows the growth of cancer cells. This medicine is used to treat many types of cancer like breast cancer, colon or rectal cancer, pancreatic cancer, and stomach cancer. This medicine may be used for other   purposes; ask your health care provider or pharmacist if you have questions. COMMON BRAND NAME(S): Adrucil What should I tell my care team before I take this medication? They need to know if you have any of these conditions: blood disorders dihydropyrimidine dehydrogenase (DPD)  deficiency infection (especially a virus infection such as chickenpox, cold sores, or herpes) kidney disease liver disease malnourished, poor nutrition recent or ongoing radiation therapy an unusual or allergic reaction to fluorouracil, other chemotherapy, other medicines, foods, dyes, or preservatives pregnant or trying to get pregnant breast-feeding How should I use this medication? This drug is given as an infusion or injection into a vein. It is administered in a hospital or clinic by a specially trained health care professional. Talk to your pediatrician regarding the use of this medicine in children. Special care may be needed. Overdosage: If you think you have taken too much of this medicine contact a poison control center or emergency room at once. NOTE: This medicine is only for you. Do not share this medicine with others. What if I miss a dose? It is important not to miss your dose. Call your doctor or health care professional if you are unable to keep an appointment. What may interact with this medication? Do not take this medicine with any of the following medications: live virus vaccines This medicine may also interact with the following medications: medicines that treat or prevent blood clots like warfarin, enoxaparin, and dalteparin This list may not describe all possible interactions. Give your health care provider a list of all the medicines, herbs, non-prescription drugs, or dietary supplements you use. Also tell them if you smoke, drink alcohol, or use illegal drugs. Some items may interact with your medicine. What should I watch for while using this medication? Visit your doctor for checks on your progress. This drug may make you feel generally unwell. This is not uncommon, as chemotherapy can affect healthy cells as well as cancer cells. Report any side effects. Continue your course of treatment even though you feel ill unless your doctor tells you to stop. In some cases,  you may be given additional medicines to help with side effects. Follow all directions for their use. Call your doctor or health care professional for advice if you get a fever, chills or sore throat, or other symptoms of a cold or flu. Do not treat yourself. This drug decreases your body's ability to fight infections. Try to avoid being around people who are sick. This medicine may increase your risk to bruise or bleed. Call your doctor or health care professional if you notice any unusual bleeding. Be careful brushing and flossing your teeth or using a toothpick because you may get an infection or bleed more easily. If you have any dental work done, tell your dentist you are receiving this medicine. Avoid taking products that contain aspirin, acetaminophen, ibuprofen, naproxen, or ketoprofen unless instructed by your doctor. These medicines may hide a fever. Do not become pregnant while taking this medicine. Women should inform their doctor if they wish to become pregnant or think they might be pregnant. There is a potential for serious side effects to an unborn child. Talk to your health care professional or pharmacist for more information. Do not breast-feed an infant while taking this medicine. Men should inform their doctor if they wish to father a child. This medicine may lower sperm counts. Do not treat diarrhea with over the counter products. Contact your doctor if you have diarrhea that lasts more than   2 days or if it is severe and watery. This medicine can make you more sensitive to the sun. Keep out of the sun. If you cannot avoid being in the sun, wear protective clothing and use sunscreen. Do not use sun lamps or tanning beds/booths. What side effects may I notice from receiving this medication? Side effects that you should report to your doctor or health care professional as soon as possible: allergic reactions like skin rash, itching or hives, swelling of the face, lips, or tongue low  blood counts - this medicine may decrease the number of white blood cells, red blood cells and platelets. You may be at increased risk for infections and bleeding. signs of infection - fever or chills, cough, sore throat, pain or difficulty passing urine signs of decreased platelets or bleeding - bruising, pinpoint red spots on the skin, black, tarry stools, blood in the urine signs of decreased red blood cells - unusually weak or tired, fainting spells, lightheadedness breathing problems changes in vision chest pain mouth sores nausea and vomiting pain, swelling, redness at site where injected pain, tingling, numbness in the hands or feet redness, swelling, or sores on hands or feet stomach pain unusual bleeding Side effects that usually do not require medical attention (report to your doctor or health care professional if they continue or are bothersome): changes in finger or toe nails diarrhea dry or itchy skin hair loss headache loss of appetite sensitivity of eyes to the light stomach upset unusually teary eyes This list may not describe all possible side effects. Call your doctor for medical advice about side effects. You may report side effects to FDA at 1-800-FDA-1088. Where should I keep my medication? This drug is given in a hospital or clinic and will not be stored at home. NOTE: This sheet is a summary. It may not cover all possible information. If you have questions about this medicine, talk to your doctor, pharmacist, or health care provider.  2022 Elsevier/Gold Standard (2019-09-22 15:00:03)  

## 2021-05-16 ENCOUNTER — Ambulatory Visit
Admission: RE | Admit: 2021-05-16 | Discharge: 2021-05-16 | Disposition: A | Discharge status: Home / Self Care | Payer: Medicare Other | Source: Ambulatory Visit | Attending: Radiation Oncology | Admitting: Radiation Oncology

## 2021-05-16 DIAGNOSIS — Z510 Encounter for antineoplastic radiation therapy: Secondary | ICD-10-CM | POA: Diagnosis not present

## 2021-05-17 ENCOUNTER — Ambulatory Visit: Payer: Medicare Other

## 2021-05-17 ENCOUNTER — Ambulatory Visit
Admission: RE | Admit: 2021-05-17 | Discharge: 2021-05-17 | Disposition: A | Discharge status: Home / Self Care | Payer: Medicare Other | Source: Ambulatory Visit | Attending: Radiation Oncology | Admitting: Radiation Oncology

## 2021-05-17 DIAGNOSIS — Z510 Encounter for antineoplastic radiation therapy: Secondary | ICD-10-CM | POA: Diagnosis not present

## 2021-05-18 ENCOUNTER — Ambulatory Visit
Admission: RE | Admit: 2021-05-18 | Discharge: 2021-05-18 | Disposition: A | Discharge status: Home / Self Care | Payer: Medicare Other | Source: Ambulatory Visit | Attending: Radiation Oncology | Admitting: Radiation Oncology

## 2021-05-18 DIAGNOSIS — Z510 Encounter for antineoplastic radiation therapy: Secondary | ICD-10-CM | POA: Diagnosis not present

## 2021-05-19 ENCOUNTER — Other Ambulatory Visit: Payer: Self-pay

## 2021-05-19 ENCOUNTER — Inpatient Hospital Stay: Payer: Medicare Other

## 2021-05-19 ENCOUNTER — Ambulatory Visit
Admission: RE | Admit: 2021-05-19 | Discharge: 2021-05-19 | Disposition: A | Discharge status: Home / Self Care | Payer: Medicare Other | Source: Ambulatory Visit | Attending: Radiation Oncology | Admitting: Radiation Oncology

## 2021-05-19 DIAGNOSIS — Z510 Encounter for antineoplastic radiation therapy: Secondary | ICD-10-CM | POA: Diagnosis not present

## 2021-05-19 DIAGNOSIS — Z5111 Encounter for antineoplastic chemotherapy: Secondary | ICD-10-CM | POA: Diagnosis not present

## 2021-05-19 DIAGNOSIS — C20 Malignant neoplasm of rectum: Secondary | ICD-10-CM

## 2021-05-19 MED ORDER — SODIUM CHLORIDE 0.9% FLUSH
10.0000 mL | INTRAVENOUS | Status: DC | PRN
  Administered 2021-05-19: 10 mL
  Filled 2021-05-19: qty 10

## 2021-05-19 MED ORDER — HEPARIN SOD (PORK) LOCK FLUSH 100 UNIT/ML IV SOLN
INTRAVENOUS | Status: AC
  Filled 2021-05-19: qty 5

## 2021-05-19 MED ORDER — HEPARIN SOD (PORK) LOCK FLUSH 100 UNIT/ML IV SOLN
500.0000 [IU] | Freq: Once | INTRAVENOUS | Status: AC | PRN
  Administered 2021-05-19: 500 [IU]
  Filled 2021-05-19: qty 5

## 2021-05-22 ENCOUNTER — Ambulatory Visit
Admission: RE | Admit: 2021-05-22 | Discharge: 2021-05-22 | Disposition: A | Discharge status: Home / Self Care | Payer: Medicare Other | Source: Ambulatory Visit | Attending: Radiation Oncology | Admitting: Radiation Oncology

## 2021-05-22 ENCOUNTER — Other Ambulatory Visit: Payer: Self-pay

## 2021-05-22 ENCOUNTER — Inpatient Hospital Stay: Payer: Medicare Other

## 2021-05-22 DIAGNOSIS — Z5111 Encounter for antineoplastic chemotherapy: Secondary | ICD-10-CM | POA: Diagnosis not present

## 2021-05-22 DIAGNOSIS — Z510 Encounter for antineoplastic radiation therapy: Secondary | ICD-10-CM | POA: Diagnosis not present

## 2021-05-22 DIAGNOSIS — C20 Malignant neoplasm of rectum: Secondary | ICD-10-CM

## 2021-05-22 LAB — COMPREHENSIVE METABOLIC PANEL
ALT: 19 U/L (ref 0–44)
AST: 27 U/L (ref 15–41)
Albumin: 4.2 g/dL (ref 3.5–5.0)
Alkaline Phosphatase: 50 U/L (ref 38–126)
Anion gap: 8 (ref 5–15)
BUN: 10 mg/dL (ref 8–23)
CO2: 26 mmol/L (ref 22–32)
Calcium: 9.1 mg/dL (ref 8.9–10.3)
Chloride: 102 mmol/L (ref 98–111)
Creatinine, Ser: 0.65 mg/dL (ref 0.44–1.00)
GFR, Estimated: >60 mL/min (ref >60)
Glucose, Bld: 107 mg/dL — ABNORMAL HIGH (ref 70–99)
Potassium: 3.3 mmol/L — ABNORMAL LOW (ref 3.5–5.1)
Sodium: 136 mmol/L (ref 135–145)
Total Bilirubin: 0.6 mg/dL (ref 0.3–1.2)
Total Protein: 7.2 g/dL (ref 6.5–8.1)

## 2021-05-22 LAB — CBC WITH DIFFERENTIAL/PLATELET
Abs Immature Granulocytes: 0.02 10*3/uL (ref 0.00–0.07)
Basophils Absolute: 0 10*3/uL (ref 0.0–0.1)
Basophils Relative: 0 %
Eosinophils Absolute: 0.4 10*3/uL (ref 0.0–0.5)
Eosinophils Relative: 7 %
HCT: 38.4 % (ref 36.0–46.0)
Hemoglobin: 12.7 g/dL (ref 12.0–15.0)
Immature Granulocytes: 0 %
Lymphocytes Relative: 10 %
Lymphs Abs: 0.5 10*3/uL — ABNORMAL LOW (ref 0.7–4.0)
MCH: 34.3 pg — ABNORMAL HIGH (ref 26.0–34.0)
MCHC: 33.1 g/dL (ref 30.0–36.0)
MCV: 103.8 fL — ABNORMAL HIGH (ref 80.0–100.0)
Monocytes Absolute: 0.9 10*3/uL (ref 0.1–1.0)
Monocytes Relative: 18 %
Neutro Abs: 3.3 10*3/uL (ref 1.7–7.7)
Neutrophils Relative %: 65 %
Platelets: 146 10*3/uL — ABNORMAL LOW (ref 150–400)
RBC: 3.7 MIL/uL — ABNORMAL LOW (ref 3.87–5.11)
RDW: 14.6 % (ref 11.5–15.5)
WBC: 5.2 10*3/uL (ref 4.0–10.5)
nRBC: 0 % (ref 0.0–0.2)

## 2021-05-22 MED ORDER — SODIUM CHLORIDE 0.9 % IV SOLN
225.0000 mg/m2/d | INTRAVENOUS | Status: DC
  Administered 2021-05-22: 1550 mg via INTRAVENOUS
  Filled 2021-05-22: qty 31

## 2021-05-22 MED ORDER — SODIUM CHLORIDE 0.9% FLUSH
10.0000 mL | Freq: Once | INTRAVENOUS | Status: AC
  Administered 2021-05-22: 10 mL via INTRAVENOUS
  Filled 2021-05-22: qty 10

## 2021-05-22 NOTE — Patient Instructions (Signed)
[facility]  Discharge Instructions: Thank you for choosing [facility] to provide your oncology and hematology care.  If you have a lab appointment with the [facility], please go directly to the [facility] and check in at the registration area.  Wear comfortable clothing and clothing appropriate for easy access to any Portacath or PICC line.   We strive to give you quality time with your provider. You may need to reschedule your appointment if you arrive late (15 or more minutes).  Arriving late affects you and other patients whose appointments are after yours.  Also, if you miss three or more appointments without notifying the office, you may be dismissed from the clinic at the provider's discretion.      For prescription refill requests, have your pharmacy contact our office and allow 72 hours for refills to be completed.    Today you received the following chemotherapy and/or immunotherapy agents: Fluorouracil      To help prevent nausea and vomiting after your treatment, we encourage you to take your nausea medication as directed.  BELOW ARE SYMPTOMS THAT SHOULD BE REPORTED IMMEDIATELY: *FEVER GREATER THAN 100.4 F (38 C) OR HIGHER *CHILLS OR SWEATING *NAUSEA AND VOMITING THAT IS NOT CONTROLLED WITH YOUR NAUSEA MEDICATION *UNUSUAL SHORTNESS OF BREATH *UNUSUAL BRUISING OR BLEEDING *URINARY PROBLEMS (pain or burning when urinating, or frequent urination) *BOWEL PROBLEMS (unusual diarrhea, constipation, pain near the anus) TENDERNESS IN MOUTH AND THROAT WITH OR WITHOUT PRESENCE OF ULCERS (sore throat, sores in mouth, or a toothache) UNUSUAL RASH, SWELLING OR PAIN  UNUSUAL VAGINAL DISCHARGE OR ITCHING   Items with * indicate a potential emergency and should be followed up as soon as possible or go to the Emergency Department if any problems should occur.  Please show the CHEMOTHERAPY ALERT CARD or IMMUNOTHERAPY ALERT CARD at  check-in to the Emergency Department and triage nurse.  Should you have questions after your visit or need to cancel or reschedule your appointment, please contact [facility]  [phone] and follow the prompts.  Office hours are 8:00 a.m. to 4:30 p.m. Monday - Friday. Please note that voicemails left after 4:00 p.m. may not be returned until the following business day.  We are closed weekends and major holidays. You have access to a nurse at all times for urgent questions. Please call the main number to the clinic [phone] and follow the prompts.  For any non-urgent questions, you may also contact your provider using MyChart. We now offer e-Visits for anyone 30 and older to request care online for non-urgent symptoms. For details visit [email or web address].   Also download the MyChart app! Go to the app store, search "MyChart", open the app, select [facility], and log in with your MyChart username and password.  Due to Covid, a mask is required upon entering the hospital/clinic. If you do not have a mask, one will be given to you upon arrival. For doctor visits, patients may have 1 support person aged 6 or older with them. For treatment visits, patients cannot have anyone with them due to current Covid guidelines and our immunocompromised population. Fluorouracil, 5-FU injection What is this medication? FLUOROURACIL, 5-FU (flure oh YOOR a sil) is a chemotherapy drug. It slows the growth of cancer cells. This medicine is used to treat many types of cancer like breast cancer, colon or rectal cancer, pancreatic cancer, and stomachcancer. This medicine may be used for other purposes; ask your health  care provider orpharmacist if you have questions. COMMON BRAND NAME(S): Adrucil What should I tell my care team before I take this medication? They need to know if you have any of these conditions: blood disorders dihydropyrimidine dehydrogenase (DPD)  deficiency infection (especially a virus infection such as chickenpox, cold sores, or herpes) kidney disease liver disease malnourished, poor nutrition recent or ongoing radiation therapy an unusual or allergic reaction to fluorouracil, other chemotherapy, other medicines, foods, dyes, or preservatives pregnant or trying to get pregnant breast-feeding How should I use this medication? This drug is given as an infusion or injection into a vein. It is administeredin a hospital or clinic by a specially trained health care professional. Talk to your pediatrician regarding the use of this medicine in children.Special care may be needed. Overdosage: If you think you have taken too much of this medicine contact apoison control center or emergency room at once. NOTE: This medicine is only for you. Do not share this medicine with others. What if I miss a dose? It is important not to miss your dose. Call your doctor or health careprofessional if you are unable to keep an appointment. What may interact with this medication? Do not take this medicine with any of the following medications: live virus vaccines This medicine may also interact with the following medications: medicines that treat or prevent blood clots like warfarin, enoxaparin, and dalteparin This list may not describe all possible interactions. Give your health care provider a list of all the medicines, herbs, non-prescription drugs, or dietary supplements you use. Also tell them if you smoke, drink alcohol, or use illegaldrugs. Some items may interact with your medicine. What should I watch for while using this medication? Visit your doctor for checks on your progress. This drug may make you feel generally unwell. This is not uncommon, as chemotherapy can affect healthy cells as well as cancer cells. Report any side effects. Continue your course oftreatment even though you feel ill unless your doctor tells you to stop. In some cases, you  may be given additional medicines to help with side effects.Follow all directions for their use. Call your doctor or health care professional for advice if you get a fever, chills or sore throat, or other symptoms of a cold or flu. Do not treat yourself. This drug decreases your body's ability to fight infections. Try toavoid being around people who are sick. This medicine may increase your risk to bruise or bleed. Call your doctor orhealth care professional if you notice any unusual bleeding. Be careful brushing and flossing your teeth or using a toothpick because you may get an infection or bleed more easily. If you have any dental work done,tell your dentist you are receiving this medicine. Avoid taking products that contain aspirin, acetaminophen, ibuprofen, naproxen, or ketoprofen unless instructed by your doctor. These medicines may hide afever. Do not become pregnant while taking this medicine. Women should inform their doctor if they wish to become pregnant or think they might be pregnant. There is a potential for serious side effects to an unborn child. Talk to your health care professional or pharmacist for more information. Do not breast-feed aninfant while taking this medicine. Men should inform their doctor if they wish to father a child. This medicinemay lower sperm counts. Do not treat diarrhea with over the counter products. Contact your doctor ifyou have diarrhea that lasts more than 2 days or if it is severe and watery. This medicine can make you more sensitive to the sun.  Keep out of the sun. If you cannot avoid being in the sun, wear protective clothing and use sunscreen.Do not use sun lamps or tanning beds/booths. What side effects may I notice from receiving this medication? Side effects that you should report to your doctor or health care professionalas soon as possible: allergic reactions like skin rash, itching or hives, swelling of the face, lips, or tongue low blood counts -  this medicine may decrease the number of white blood cells, red blood cells and platelets. You may be at increased risk for infections and bleeding. signs of infection - fever or chills, cough, sore throat, pain or difficulty passing urine signs of decreased platelets or bleeding - bruising, pinpoint red spots on the skin, black, tarry stools, blood in the urine signs of decreased red blood cells - unusually weak or tired, fainting spells, lightheadedness breathing problems changes in vision chest pain mouth sores nausea and vomiting pain, swelling, redness at site where injected pain, tingling, numbness in the hands or feet redness, swelling, or sores on hands or feet stomach pain unusual bleeding Side effects that usually do not require medical attention (report to yourdoctor or health care professional if they continue or are bothersome): changes in finger or toe nails diarrhea dry or itchy skin hair loss headache loss of appetite sensitivity of eyes to the light stomach upset unusually teary eyes This list may not describe all possible side effects. Call your doctor for medical advice about side effects. You may report side effects to FDA at1-800-FDA-1088. Where should I keep my medication? This drug is given in a hospital or clinic and will not be stored at home. NOTE: This sheet is a summary. It may not cover all possible information. If you have questions about this medicine, talk to your doctor, pharmacist, orhealth care provider.  2022 Elsevier/Gold Standard (2019-09-22 15:00:03)

## 2021-05-23 ENCOUNTER — Inpatient Hospital Stay (HOSPITAL_BASED_OUTPATIENT_CLINIC_OR_DEPARTMENT_OTHER): Payer: Medicare Other | Admitting: Oncology

## 2021-05-23 ENCOUNTER — Ambulatory Visit
Admission: RE | Admit: 2021-05-23 | Discharge: 2021-05-23 | Disposition: A | Discharge status: Home / Self Care | Payer: Medicare Other | Source: Ambulatory Visit | Attending: Radiation Oncology | Admitting: Radiation Oncology

## 2021-05-23 VITALS — BP 108/76 | HR 95 | Temp 97.9°F | Resp 16 | Wt 99.0 lb

## 2021-05-23 DIAGNOSIS — Z5111 Encounter for antineoplastic chemotherapy: Secondary | ICD-10-CM | POA: Diagnosis not present

## 2021-05-23 DIAGNOSIS — Z510 Encounter for antineoplastic radiation therapy: Secondary | ICD-10-CM | POA: Diagnosis not present

## 2021-05-23 DIAGNOSIS — C20 Malignant neoplasm of rectum: Secondary | ICD-10-CM | POA: Diagnosis not present

## 2021-05-23 NOTE — Progress Notes (Signed)
Pt states she has diarrhea Mon - Friday when she has the chemo pump.She feels good on Saturdays then Sunday it starts back. Clear yellow liquid stools lately. Appetite is diminished, doesn't really feel hungry. Afraid to take antidiarrheal that it will turn into constipation which makes her rectum very painful.

## 2021-05-23 NOTE — Progress Notes (Signed)
[facility]  Telephone:(336) [phone] Fax:(336) [phone]  ID: Veronica Boyd OB: [birth year]  MR#: [id number]  HGD#:[id number]  Patient Care Team: Tracie Harrier, MD as PCP - General (Internal Medicine) Lloyd Huger, MD as Consulting Physician (Oncology) Leonie Green, MD as Referring Physician (Surgery) [name], MD as Referring Physician (Radiation Oncology) Jacquelin Hawking, NP as Nurse Practitioner (Oncology) Clent Jacks, RN as Oncology Nurse Navigator   CHIEF COMPLAINT: Stage IV rectal cancer, history of follicular lymphoma.  INTERVAL HISTORY: Patient returns to clinic today for further evaluation and continuation of cycle 5 of 5-FU pump along with her daily XRT.  Over the past 1 to 2 weeks she has developed significant diarrhea that only resolves over the weekend when her pump is discontinued.  She otherwise feels well.  She does not complain of any weakness or fatigue.  She has a good appetite and denies weight loss.  She has no neurologic complaints.  She denies any recent fevers or illnesses.  She denies any chest pain, shortness of breath, cough, or hemoptysis. She denies any nausea, vomiting, constipation, or diarrhea. She has no urinary complaints.  Patient offers no further specific complaints today.  REVIEW OF SYSTEMS:   Review of Systems  Constitutional: Negative.  Negative for fever, malaise/fatigue and weight loss.  Respiratory: Negative.  Negative for cough, hemoptysis and shortness of breath.   Cardiovascular: Negative.  Negative for chest pain and leg swelling.  Gastrointestinal:  Positive for diarrhea. Negative for abdominal pain, blood in stool, melena, nausea and vomiting.  Genitourinary: Negative.  Negative for dysuria.  Musculoskeletal: Negative.  Negative for back pain, joint pain and neck pain.  Skin: Negative.  Negative for rash.  Neurological: Negative.  Negative for sensory change, focal weakness, weakness and  headaches.  Psychiatric/Behavioral: Negative.  The patient is not nervous/anxious and does not have insomnia.    As per HPI. Otherwise, a complete review of systems is negative.   PAST MEDICAL HISTORY: Past Medical History:  Diagnosis Date   Arthritis    Cataract    bilateral   Chicken pox    Colon polyp    Follicular lymphoma ([place]) 08/2016   lymph nodes    Hyperlipidemia    Osteoporosis     PAST SURGICAL HISTORY: Past Surgical History:  Procedure Laterality Date   AXILLARY LYMPH NODE DISSECTION Right 08/21/2016   Procedure: AXILLARY LYMPH NODE excision;  Surgeon: Leonie Green, MD;  Location: [facility] ORS;  Service: General;  Laterality: Right;   CATARACT EXTRACTION, BILATERAL Bilateral    COLONOSCOPY N/A 09/19/2020   Procedure: COLONOSCOPY;  Surgeon: Lesly Rubenstein, MD;  Location: [facility] ENDOSCOPY;  Service: Endoscopy;  Laterality: N/A;   EYE SURGERY     JOINT REPLACEMENT Left    PORTA CATH INSERTION N/A 09/16/2017   Procedure: PORTA CATH INSERTION;  Surgeon: Algernon Huxley, MD;  Location: [facility];  Service: Cardiovascular;  Laterality: N/A;   TONSILLECTOMY     TOTAL HIP ARTHROPLASTY Left 1992    FAMILY HISTORY: Family History  Problem Relation Age of Onset   Diabetes Sister    Lung cancer Brother    Diabetes Brother    Basal cell carcinoma Daughter    Breast cancer Paternal Aunt     ADVANCED DIRECTIVES (Y/N):  N  HEALTH MAINTENANCE: Social History   Tobacco Use   Smoking status: Never   Smokeless tobacco: Never  Vaping Use   Vaping Use: Never used  Substance Use  Topics   Alcohol use: No   Drug use: No     Colonoscopy:  PAP:  Bone density:  Lipid panel:  No Known Allergies  Current Outpatient Medications  Medication Sig Dispense Refill   alendronate (FOSAMAX) 70 MG tablet TAKE 1 TABLET EVERY 7 DAYS TAKE WITH A FULL GLASS OF WATER. DO NOT LIE DOWN FOR THE NEXT 30 MIN.  3   diclofenac sodium (VOLTAREN) 1 % GEL APPLY 2 GRAMS  TOPICALLY 2 (TWO) TIMES DAILY     ibandronate (BONIVA) 150 MG tablet PLEASE SEE ATTACHED FOR DETAILED DIRECTIONS     Multiple Vitamins-Minerals (CENTRUM SILVER PO) Take 1 tablet by mouth daily.     pantoprazole (PROTONIX) 20 MG tablet Take 20 mg by mouth at bedtime.      simvastatin (ZOCOR) 20 MG tablet TAKE 1 TABLET BY MOUTH EVERY DAY AT NIGHT     tizanidine (ZANAFLEX) 2 MG capsule Take 2 mg by mouth 3 (three) times daily.     traMADol-acetaminophen (ULTRACET) 37.5-325 MG tablet Take 1 tablet by mouth every 8 (eight) hours as needed.      No current facility-administered medications for this visit.   Facility-Administered Medications Ordered in Other Visits  Medication Dose Route Frequency Provider Last Rate Last Admin   heparin lock flush 100 unit/mL  500 Units Intravenous Once Grayland Ormond, Kathlene November, MD       heparin lock flush 100 unit/mL  500 Units Intracatheter PRN Grayland Ormond, Kathlene November, MD       heparin lock flush 100 unit/mL  500 Units Intravenous Once Lloyd Huger, MD       heparin lock flush 100 unit/mL  500 Units Intravenous Once Lloyd Huger, MD       sodium chloride flush (NS) 0.9 % injection 10 mL  10 mL Intravenous PRN Lloyd Huger, MD   10 mL at 12/04/17 0901   sodium chloride flush (NS) 0.9 % injection 10 mL  10 mL Intracatheter PRN Lloyd Huger, MD       sodium chloride flush (NS) 0.9 % injection 10 mL  10 mL Intravenous PRN Lloyd Huger, MD   10 mL at 04/24/21 0839   yttrium-90 injection 46.6 millicurie  59.9 millicurie Intravenous Once Chrystal, Eulas Post, MD        OBJECTIVE: Vitals:   05/23/21 1441  BP: 108/76  Pulse: 95  Resp: 16  Temp: 97.9 F (36.6 C)  SpO2: 98%     Body mass index is 20.69 kg/m.    ECOG FS:0 - Asymptomatic  General: Thin, no acute distress. Eyes: Pink conjunctiva, anicteric sclera. HEENT: Normocephalic, moist mucous membranes. Lungs: No audible wheezing or coughing. Heart: Regular rate and rhythm. Abdomen:  Soft, nontender, no obvious distention. Musculoskeletal: No edema, cyanosis, or clubbing. Neuro: Alert, answering all questions appropriately. Cranial nerves grossly intact. Skin: No rashes or petechiae noted. Psych: Normal affect.  LAB RESULTS:  Lab Results  Component Value Date   NA 136 05/22/2021   K 3.3 (L) 05/22/2021   CL 102 05/22/2021   CO2 26 05/22/2021   GLUCOSE 107 (H) 05/22/2021   BUN 10 05/22/2021   CREATININE 0.65 05/22/2021   CALCIUM 9.1 05/22/2021   PROT 7.2 05/22/2021   ALBUMIN 4.2 05/22/2021   AST 27 05/22/2021   ALT 19 05/22/2021   ALKPHOS 50 05/22/2021   BILITOT 0.6 05/22/2021   GFRNONAA >60 05/22/2021   GFRAA >60 06/28/2020    Lab Results  Component Value Date  WBC 5.2 05/22/2021   NEUTROABS 3.3 05/22/2021   HGB 12.7 05/22/2021   HCT 38.4 05/22/2021   MCV 103.8 (H) 05/22/2021   PLT 146 (L) 05/22/2021     STUDIES: No results found.    ASSESSMENT: Stage IV rectal cancer, history of follicular lymphoma. PLAN:    1.  Stage IV rectal cancer: Biopsy confirmed rectal cancer.  MRI completed at [facility] on October 11, 2020 revealed extension of the tumor beyond the wall into the rectovaginal recess with suspected invasion of the vaginal wall posteriorly.  Tumor also appears to involve the internal anal sphincter.  There are also numerous prominent presacral and mesorectal lymph nodes highly suspicious for malignancy.  PET scan results from November 17, 2020 reviewed independently with 3 hypermetabolic right lower lobe lung nodules consistent with pulmonary metastasis.  Repeat PET scan on March 01, 2021 reviewed independently with significant improvement of pulmonary nodules as well as known rectal primary.  Patient was discussed at tumor board and consensus was to refer to radiation oncology for consideration of concurrent chemotherapy and XRT.  She completed cycle 8 of FOLFOX on Mar 29, 2021.  Continue cycle 5 of 5-FU pump along with her daily XRT.   Given her persistent diarrhea will discontinue chemotherapy at the conclusion of the cycle.  Patient will finish her daily XRT on June 01, 2021.  Return to clinic in 1 week for repeat laboratory work and further evaluation. 2. Recurrent follicular lymphoma: CT scan results from December 23, 2019 reviewed independently with no obvious evidence of recurrent or progressive disease.  PET scan results as above consistent with rectal cancer metastasis and no evidence of lymphoma.   Patient received Zevalin on July 30, 2017 with not much therapeutic effect.  She subsequently underwent cycles 5 of R-CHOP chemotherapy with Neulasta support completing on December 12, 2017.  Patient continues to be in complete remission.  3.  Hypokalemia: Mild.  Continue oral potassium supplementation. 4.  Neutropenia: Resolved. 5.  Thrombocytopenia: Chronic and unchanged.  Patient's platelet count is 146 today. 6.  Diarrhea: Complete cycle 5 of chemotherapy then discontinue.  Continue Lomotil as prescribed.  Patient expressed understanding and was in agreement with this plan. She also understands that She can call clinic at any time with any questions, concerns, or complaints.   Cancer Staging 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 [facility]) Staging form: Lymphoid Neoplasms, AJCC 6th Edition - Clinical stage from 08/27/2016: Stage IIE - Signed by Lloyd Huger, MD on 08/27/2016  Rectal cancer [facility]) Staging form: Colon and Rectum, AJCC 8th Edition - Clinical: Stage IVA Larwance Sachs, Noelle.Marry) - Signed by Lloyd Huger, MD on 11/02/2020   Lloyd Huger, MD   05/26/2021 12:51 PM

## 2021-05-24 ENCOUNTER — Ambulatory Visit
Admission: RE | Admit: 2021-05-24 | Discharge: 2021-05-24 | Disposition: A | Discharge status: Home / Self Care | Payer: Medicare Other | Source: Ambulatory Visit | Attending: Radiation Oncology | Admitting: Radiation Oncology

## 2021-05-24 DIAGNOSIS — Z510 Encounter for antineoplastic radiation therapy: Secondary | ICD-10-CM | POA: Diagnosis not present

## 2021-05-25 ENCOUNTER — Ambulatory Visit
Admission: RE | Admit: 2021-05-25 | Discharge: 2021-05-25 | Disposition: A | Discharge status: Home / Self Care | Payer: Medicare Other | Source: Ambulatory Visit | Attending: Radiation Oncology | Admitting: Radiation Oncology

## 2021-05-25 ENCOUNTER — Ambulatory Visit: Payer: Medicare Other

## 2021-05-25 DIAGNOSIS — Z510 Encounter for antineoplastic radiation therapy: Secondary | ICD-10-CM | POA: Diagnosis not present

## 2021-05-26 ENCOUNTER — Encounter: Payer: Self-pay | Admitting: Oncology

## 2021-05-26 ENCOUNTER — Inpatient Hospital Stay: Payer: Medicare Other

## 2021-05-26 ENCOUNTER — Ambulatory Visit
Admission: RE | Admit: 2021-05-26 | Discharge: 2021-05-26 | Disposition: A | Discharge status: Home / Self Care | Payer: Medicare Other | Source: Ambulatory Visit | Attending: Radiation Oncology | Admitting: Radiation Oncology

## 2021-05-26 VITALS — BP 132/60 | HR 102 | Temp 98.5°F

## 2021-05-26 DIAGNOSIS — D500 Iron deficiency anemia secondary to blood loss (chronic): Secondary | ICD-10-CM

## 2021-05-26 DIAGNOSIS — Z5111 Encounter for antineoplastic chemotherapy: Secondary | ICD-10-CM | POA: Diagnosis not present

## 2021-05-26 DIAGNOSIS — Z510 Encounter for antineoplastic radiation therapy: Secondary | ICD-10-CM | POA: Diagnosis not present

## 2021-05-26 DIAGNOSIS — C20 Malignant neoplasm of rectum: Secondary | ICD-10-CM

## 2021-05-26 LAB — CBC WITH DIFFERENTIAL/PLATELET
Abs Immature Granulocytes: 0.01 10*3/uL (ref 0.00–0.07)
Basophils Absolute: 0 10*3/uL (ref 0.0–0.1)
Basophils Relative: 1 %
Eosinophils Absolute: 0.3 10*3/uL (ref 0.0–0.5)
Eosinophils Relative: 6 %
HCT: 39.4 % (ref 36.0–46.0)
Hemoglobin: 13.1 g/dL (ref 12.0–15.0)
Immature Granulocytes: 0 %
Lymphocytes Relative: 10 %
Lymphs Abs: 0.4 10*3/uL — ABNORMAL LOW (ref 0.7–4.0)
MCH: 34.2 pg — ABNORMAL HIGH (ref 26.0–34.0)
MCHC: 33.2 g/dL (ref 30.0–36.0)
MCV: 102.9 fL — ABNORMAL HIGH (ref 80.0–100.0)
Monocytes Absolute: 0.8 10*3/uL (ref 0.1–1.0)
Monocytes Relative: 18 %
Neutro Abs: 2.8 10*3/uL (ref 1.7–7.7)
Neutrophils Relative %: 65 %
Platelets: 127 10*3/uL — ABNORMAL LOW (ref 150–400)
RBC: 3.83 MIL/uL — ABNORMAL LOW (ref 3.87–5.11)
RDW: 14.3 % (ref 11.5–15.5)
WBC: 4.2 10*3/uL (ref 4.0–10.5)
nRBC: 0 % (ref 0.0–0.2)

## 2021-05-26 MED ORDER — HEPARIN SOD (PORK) LOCK FLUSH 100 UNIT/ML IV SOLN
INTRAVENOUS | Status: AC
  Filled 2021-05-26: qty 5

## 2021-05-26 MED ORDER — SODIUM CHLORIDE 0.9% FLUSH
10.0000 mL | INTRAVENOUS | Status: DC | PRN
  Administered 2021-05-26: 10 mL
  Filled 2021-05-26: qty 10

## 2021-05-26 MED ORDER — HEPARIN SOD (PORK) LOCK FLUSH 100 UNIT/ML IV SOLN
500.0000 [IU] | Freq: Once | INTRAVENOUS | Status: AC | PRN
Start: 2021-05-26 — End: 2021-05-26
  Administered 2021-05-26: 500 [IU]
  Filled 2021-05-26: qty 5

## 2021-05-26 NOTE — Progress Notes (Signed)
Per pt she has been having black stools/ diarrhea since yesterday. Pt is here to have chemotherapy pump d/c. VSS. Pt states that her diarrhea have gotten better today and that she has only had two episodes. MD and treatment team notified. Per MD to draw CBC and wait for results. Once CBC resulted, Dr. Grayland Ormond notified. Per MD he feels comfortable with discharging patient home and will see her on Tuesday 05/30/21 for her scheduled appointment. No new orders given at this time. Appointments printed and handed to pt. All questions answered at this time. Pt stable for discharge. VSS.   Rebbeca Sheperd [employer]

## 2021-05-26 NOTE — Patient Instructions (Signed)
[facility]  Discharge Instructions: Thank you for choosing [facility] to provide your oncology and hematology care.  If you have a lab appointment with the [facility], please go directly to the [facility] and check in at the registration area.  Wear comfortable clothing and clothing appropriate for easy access to any Portacath or PICC line.   We strive to give you quality time with your provider. You may need to reschedule your appointment if you arrive late (15 or more minutes).  Arriving late affects you and other patients whose appointments are after yours.  Also, if you miss three or more appointments without notifying the office, you may be dismissed from the clinic at the provider's discretion.      For prescription refill requests, have your pharmacy contact our office and allow 72 hours for refills to be completed.    Today y   To help prevent nausea and vomiting after your treatment, we encourage you to take your nausea medication as directed.  BELOW ARE SYMPTOMS THAT SHOULD BE REPORTED IMMEDIATELY: *FEVER GREATER THAN 100.4 F (38 C) OR HIGHER *CHILLS OR SWEATING *NAUSEA AND VOMITING THAT IS NOT CONTROLLED WITH YOUR NAUSEA MEDICATION *UNUSUAL SHORTNESS OF BREATH *UNUSUAL BRUISING OR BLEEDING *URINARY PROBLEMS (pain or burning when urinating, or frequent urination) *BOWEL PROBLEMS (unusual diarrhea, constipation, pain near the anus) TENDERNESS IN MOUTH AND THROAT WITH OR WITHOUT PRESENCE OF ULCERS (sore throat, sores in mouth, or a toothache) UNUSUAL RASH, SWELLING OR PAIN  UNUSUAL VAGINAL DISCHARGE OR ITCHING   Items with * indicate a potential emergency and should be followed up as soon as possible or go to the Emergency Department if any problems should occur.  Please show the CHEMOTHERAPY ALERT CARD or IMMUNOTHERAPY ALERT CARD at check-in to the Emergency Department and triage nurse.  Should you have questions after  your visit or need to cancel or reschedule your appointment, please contact [facility]  [phone] and follow the prompts.  Office hours are 8:00 a.m. to 4:30 p.m. Monday - Friday. Please note that voicemails left after 4:00 p.m. may not be returned until the following business day.  We are closed weekends and major holidays. You have access to a nurse at all times for urgent questions. Please call the main number to the clinic [phone] and follow the prompts.  For any non-urgent questions, you may also contact your provider using MyChart. We now offer e-Visits for anyone 63 and older to request care online for non-urgent symptoms. For details visit [email or web address].   Also download the MyChart app! Go to the app store, search "MyChart", open the app, select [facility], and log in with your MyChart username and password.  Due to Covid, a mask is required upon entering the hospital/clinic. If you do not have a mask, one will be given to you upon arrival. For doctor visits, patients may have 1 support person aged 25 or older with them. For treatment visits, patients cannot have anyone with them due to current Covid guidelines and our immunocompromised population.

## 2021-05-29 ENCOUNTER — Inpatient Hospital Stay: Payer: Medicare Other

## 2021-05-29 ENCOUNTER — Ambulatory Visit: Admission: RE | Admit: 2021-05-29 | Payer: Medicare Other | Source: Ambulatory Visit

## 2021-05-29 ENCOUNTER — Ambulatory Visit
Admission: RE | Admit: 2021-05-29 | Discharge: 2021-05-29 | Disposition: A | Discharge status: Home / Self Care | Payer: Medicare Other | Source: Ambulatory Visit | Attending: Radiation Oncology | Admitting: Radiation Oncology

## 2021-05-29 DIAGNOSIS — Z510 Encounter for antineoplastic radiation therapy: Secondary | ICD-10-CM | POA: Diagnosis not present

## 2021-05-29 NOTE — Progress Notes (Signed)
[facility]  Telephone:(336) [phone] Fax:(336) [phone]  ID: Veronica Boyd OB: 1946-04-08  MR#: [id number]  [id number]  Patient Care Team: Tracie Harrier, MD as PCP - General (Internal Medicine) Lloyd Huger, MD as Consulting Physician (Oncology) Leonie Green, MD as Referring Physician (Surgery) [name], MD as Referring Physician (Radiation Oncology) Jacquelin Hawking, NP as Nurse Practitioner (Oncology) Clent Jacks, RN as Oncology Nurse Navigator   CHIEF COMPLAINT: Stage IV rectal cancer, history of follicular lymphoma.  INTERVAL HISTORY: Patient returns to clinic today for repeat laboratory work and further evaluation.  She continues to have persistent diarrhea despite discontinuing chemotherapy last week.  She has increased weakness and fatigue.  She has a poor appetite, but denies weight loss.  She has no neurologic complaints.  She denies any recent fevers or illnesses.  She denies any chest pain, shortness of breath, cough, or hemoptysis. She denies any nausea, vomiting, constipation, or diarrhea. She has no urinary complaints.  Patient offers no further specific complaints today.  REVIEW OF SYSTEMS:   Review of Systems  Constitutional:  Positive for malaise/fatigue. Negative for fever and weight loss.  Respiratory: Negative.  Negative for cough, hemoptysis and shortness of breath.   Cardiovascular: Negative.  Negative for chest pain and leg swelling.  Gastrointestinal:  Positive for diarrhea. Negative for abdominal pain, blood in stool, melena, nausea and vomiting.  Genitourinary: Negative.  Negative for dysuria.  Musculoskeletal: Negative.  Negative for back pain, joint pain and neck pain.  Skin: Negative.  Negative for rash.  Neurological:  Positive for weakness. Negative for sensory change, focal weakness and headaches.  Psychiatric/Behavioral: Negative.  The patient is not nervous/anxious and does not have insomnia.     As per HPI. Otherwise, a complete review of systems is negative.   PAST MEDICAL HISTORY: Past Medical History:  Diagnosis Date   Arthritis    Cataract    bilateral   Chicken pox    Colon polyp    Follicular lymphoma ([place]) 08/2016   lymph nodes    Hyperlipidemia    Osteoporosis     PAST SURGICAL HISTORY: Past Surgical History:  Procedure Laterality Date   AXILLARY LYMPH NODE DISSECTION Right 08/21/2016   Procedure: AXILLARY LYMPH NODE excision;  Surgeon: Leonie Green, MD;  Location: [facility] ORS;  Service: General;  Laterality: Right;   CATARACT EXTRACTION, BILATERAL Bilateral    COLONOSCOPY N/A 09/19/2020   Procedure: COLONOSCOPY;  Surgeon: Lesly Rubenstein, MD;  Location: [facility] END[facility];  Service: Endoscopy;  Laterality: N/A;   EYE SURGERY     JOINT REPLACEMENT Left    PORTA CATH INSERTION N/A 09/16/2017   Procedure: PORTA CATH INSERTION;  Surgeon: Algernon Huxley, MD;  Location: [facility];  Service: Cardiovascular;  Laterality: N/A;   TONSILLECTOMY     TOTAL HIP ARTHROPLASTY Left 1992    FAMILY HISTORY: Family History  Problem Relation Age of Onset   Diabetes Sister    Lung cancer Brother    Diabetes Brother    Basal cell carcinoma Daughter    Breast cancer Paternal Aunt     ADVANCED DIRECTIVES (Y/N):  N  HEALTH MAINTENANCE: Social History   Tobacco Use   Smoking status: Never   Smokeless tobacco: Never  Vaping Use   Vaping Use: Never used  Substance Use Topics   Alcohol use: No   Drug use: No     Colonoscopy:  PAP:  Bone density:  Lipid panel:  No Known  Allergies  Current Outpatient Medications  Medication Sig Dispense Refill   alendronate (FOSAMAX) 70 MG tablet TAKE 1 TABLET EVERY 7 DAYS TAKE WITH A FULL GLASS OF WATER. DO NOT LIE DOWN FOR THE NEXT 30 MIN.  3   diclofenac sodium (VOLTAREN) 1 % GEL APPLY 2 GRAMS TOPICALLY 2 (TWO) TIMES DAILY     diphenoxylate-atropine (LOMOTIL) 2.5-0.025 MG tablet Take 2 tablets by mouth 4  (four) times daily as needed for diarrhea or loose stools. 60 tablet 1   ibandronate (BONIVA) 150 MG tablet PLEASE SEE ATTACHED FOR DETAILED DIRECTIONS     Multiple Vitamins-Minerals (CENTRUM SILVER PO) Take 1 tablet by mouth daily.     pantoprazole (PROTONIX) 20 MG tablet Take 20 mg by mouth at bedtime.      simvastatin (ZOCOR) 20 MG tablet TAKE 1 TABLET BY MOUTH EVERY DAY AT NIGHT     tizanidine (ZANAFLEX) 2 MG capsule Take 2 mg by mouth 3 (three) times daily.     traMADol-acetaminophen (ULTRACET) 37.5-325 MG tablet Take 1 tablet by mouth every 8 (eight) hours as needed.      No current facility-administered medications for this visit.   Facility-Administered Medications Ordered in Other Visits  Medication Dose Route Frequency Provider Last Rate Last Admin   heparin lock flush 100 unit/mL  500 Units Intravenous Once Grayland Ormond, Kathlene November, MD       heparin lock flush 100 unit/mL  500 Units Intracatheter PRN Grayland Ormond, Kathlene November, MD       heparin lock flush 100 unit/mL  500 Units Intravenous Once Lloyd Huger, MD       heparin lock flush 100 unit/mL  500 Units Intravenous Once Lloyd Huger, MD       sodium chloride flush (NS) 0.9 % injection 10 mL  10 mL Intravenous PRN Lloyd Huger, MD   10 mL at 12/04/17 0901   sodium chloride flush (NS) 0.9 % injection 10 mL  10 mL Intracatheter PRN Lloyd Huger, MD       sodium chloride flush (NS) 0.9 % injection 10 mL  10 mL Intravenous PRN Lloyd Huger, MD   10 mL at 04/24/21 0839   yttrium-90 injection [id number] millicurie  99991111 millicurie Intravenous Once Chrystal, Eulas Post, MD        OBJECTIVE: Vitals:   05/30/21 1458  BP: 111/73  Pulse: 97  Resp: 18  Temp: 98 F (36.7 C)  SpO2: 99%     Body mass index is 20.27 kg/m.    ECOG FS:0 - Asymptomatic  General: Thin, no acute distress. Eyes: Pink conjunctiva, anicteric sclera. HEENT: Normocephalic, moist mucous membranes. Lungs: No audible wheezing or coughing. Heart:  Regular rate and rhythm. Abdomen: Soft, nontender, no obvious distention. Musculoskeletal: No edema, cyanosis, or clubbing. Neuro: Alert, answering all questions appropriately. Cranial nerves grossly intact. Skin: No rashes or petechiae noted. Psych: Normal affect.   LAB RESULTS:  Lab Results  Component Value Date   NA 140 05/30/2021   K 2.6 (LL) 05/30/2021   CL 100 05/30/2021   CO2 28 05/30/2021   GLUCOSE 95 05/30/2021   BUN 12 05/30/2021   CREATININE 0.88 05/30/2021   CALCIUM 9.3 05/30/2021   PROT 7.0 05/30/2021   ALBUMIN 4.1 05/30/2021   AST 30 05/30/2021   ALT 20 05/30/2021   ALKPHOS 48 05/30/2021   BILITOT 0.5 05/30/2021   GFRNONAA >60 05/30/2021   GFRAA >60 06/28/2020    Lab Results  Component Value Date   WBC  5.7 05/30/2021   NEUTROABS 3.8 05/30/2021   HGB 14.0 05/30/2021   HCT 42.0 05/30/2021   MCV 102.7 (H) 05/30/2021   PLT 149 (L) 05/30/2021     STUDIES: No results found.    ASSESSMENT: Stage IV rectal cancer, history of follicular lymphoma. PLAN:    1.  Stage IV rectal cancer: Biopsy confirmed rectal cancer.  MRI completed at [facility] on October 11, 2020 revealed extension of the tumor beyond the wall into the rectovaginal recess with suspected invasion of the vaginal wall posteriorly.  Tumor also appears to involve the internal anal sphincter.  There are also numerous prominent presacral and mesorectal lymph nodes highly suspicious for malignancy.  PET scan results from November 17, 2020 reviewed independently with 3 hypermetabolic right lower lobe lung nodules consistent with pulmonary metastasis.  Repeat PET scan on March 01, 2021 reviewed independently with significant improvement of pulmonary nodules as well as known rectal primary.  Patient was discussed at tumor board and consensus was to refer to radiation oncology for consideration of concurrent chemotherapy and XRT.  She completed cycle 8 of FOLFOX on Mar 29, 2021.  She completed cycle 5 of  her 5-FU pump on May 26, 2021.  XRT will complete later this week on June 01, 2021.  Patient will likely need additional treatment in the future, but will get a PET scan in approximately 6 weeks for further evaluation.  Return to clinic 1 to 2 days after imaging to discuss the results.   2. Recurrent follicular lymphoma: CT scan results from December 23, 2019 reviewed independently with no obvious evidence of recurrent or progressive disease.  PET scan results as above consistent with rectal cancer metastasis and no evidence of lymphoma.   Patient received Zevalin on July 30, 2017 with not much therapeutic effect.  She subsequently underwent cycles 5 of R-CHOP chemotherapy with Neulasta support completing on December 12, 2017.  Patient continues to be in complete remission.  3.  Hypokalemia: Significantly worse.  Patient's potassium is 2.6.  Continue oral potassium supplementation.  Patient will also return to clinic later this week to receive 40 mEq IV potassium. 4.  Neutropenia: Resolved. 5.  Thrombocytopenia: Essentially resolved. 6.  Diarrhea: Chemotherapy discontinued last week.  Radiation will complete later this week.  Continue Lomotil as prescribed.  Patient expressed understanding and was in agreement with this plan. She also understands that She can call clinic at any time with any questions, concerns, or complaints.   Cancer Staging 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 [facility]) Staging form: Lymphoid Neoplasms, AJCC 6th Edition - Clinical stage from 08/27/2016: Stage IIE - Signed by Lloyd Huger, MD on 08/27/2016  Rectal cancer [facility]) Staging form: Colon and Rectum, AJCC 8th Edition - Clinical: Stage IVA Larwance Sachs, Noelle.Marry) - Signed by Lloyd Huger, MD on 11/02/2020   Lloyd Huger, MD   06/01/2021 6:19 AM

## 2021-05-30 ENCOUNTER — Inpatient Hospital Stay: Payer: Medicare Other

## 2021-05-30 ENCOUNTER — Ambulatory Visit
Admission: RE | Admit: 2021-05-30 | Discharge: 2021-05-30 | Disposition: A | Discharge status: Home / Self Care | Payer: Medicare Other | Source: Ambulatory Visit | Attending: Radiation Oncology | Admitting: Radiation Oncology

## 2021-05-30 ENCOUNTER — Encounter: Payer: Self-pay | Admitting: Oncology

## 2021-05-30 ENCOUNTER — Inpatient Hospital Stay (HOSPITAL_BASED_OUTPATIENT_CLINIC_OR_DEPARTMENT_OTHER): Payer: Medicare Other | Admitting: Oncology

## 2021-05-30 VITALS — BP 111/73 | HR 97 | Temp 98.0°F | Resp 18 | Wt 97.0 lb

## 2021-05-30 DIAGNOSIS — Z5111 Encounter for antineoplastic chemotherapy: Secondary | ICD-10-CM | POA: Diagnosis not present

## 2021-05-30 DIAGNOSIS — C20 Malignant neoplasm of rectum: Secondary | ICD-10-CM

## 2021-05-30 DIAGNOSIS — Z510 Encounter for antineoplastic radiation therapy: Secondary | ICD-10-CM | POA: Diagnosis not present

## 2021-05-30 LAB — CBC WITH DIFFERENTIAL/PLATELET
Abs Immature Granulocytes: 0.01 10*3/uL (ref 0.00–0.07)
Basophils Absolute: 0 10*3/uL (ref 0.0–0.1)
Basophils Relative: 1 %
Eosinophils Absolute: 0.2 10*3/uL (ref 0.0–0.5)
Eosinophils Relative: 4 %
HCT: 42 % (ref 36.0–46.0)
Hemoglobin: 14 g/dL (ref 12.0–15.0)
Immature Granulocytes: 0 %
Lymphocytes Relative: 10 %
Lymphs Abs: 0.6 10*3/uL — ABNORMAL LOW (ref 0.7–4.0)
MCH: 34.2 pg — ABNORMAL HIGH (ref 26.0–34.0)
MCHC: 33.3 g/dL (ref 30.0–36.0)
MCV: 102.7 fL — ABNORMAL HIGH (ref 80.0–100.0)
Monocytes Absolute: 1.1 10*3/uL — ABNORMAL HIGH (ref 0.1–1.0)
Monocytes Relative: 19 %
Neutro Abs: 3.8 10*3/uL (ref 1.7–7.7)
Neutrophils Relative %: 66 %
Platelets: 149 10*3/uL — ABNORMAL LOW (ref 150–400)
RBC: 4.09 MIL/uL (ref 3.87–5.11)
RDW: 14.8 % (ref 11.5–15.5)
WBC: 5.7 10*3/uL (ref 4.0–10.5)
nRBC: 0 % (ref 0.0–0.2)

## 2021-05-30 LAB — COMPREHENSIVE METABOLIC PANEL
ALT: 20 U/L (ref 0–44)
AST: 30 U/L (ref 15–41)
Albumin: 4.1 g/dL (ref 3.5–5.0)
Alkaline Phosphatase: 48 U/L (ref 38–126)
Anion gap: 12 (ref 5–15)
BUN: 12 mg/dL (ref 8–23)
CO2: 28 mmol/L (ref 22–32)
Calcium: 9.3 mg/dL (ref 8.9–10.3)
Chloride: 100 mmol/L (ref 98–111)
Creatinine, Ser: 0.88 mg/dL (ref 0.44–1.00)
GFR, Estimated: >60 mL/min (ref >60)
Glucose, Bld: 95 mg/dL (ref 70–99)
Potassium: 2.6 mmol/L — CL (ref 3.5–5.1)
Sodium: 140 mmol/L (ref 135–145)
Total Bilirubin: 0.5 mg/dL (ref 0.3–1.2)
Total Protein: 7 g/dL (ref 6.5–8.1)

## 2021-05-30 MED ORDER — DIPHENOXYLATE-ATROPINE 2.5-0.025 MG PO TABS: 2.0000 | ORAL_TABLET | Freq: Four times a day (QID) | ORAL | 1 refills | Status: DC | PRN

## 2021-05-30 NOTE — Progress Notes (Signed)
Patient reports diarrhea, not sleeping well

## 2021-05-31 ENCOUNTER — Ambulatory Visit
Admission: RE | Admit: 2021-05-31 | Discharge: 2021-05-31 | Disposition: A | Discharge status: Home / Self Care | Payer: Medicare Other | Source: Ambulatory Visit | Attending: Radiation Oncology | Admitting: Radiation Oncology

## 2021-05-31 DIAGNOSIS — Z510 Encounter for antineoplastic radiation therapy: Secondary | ICD-10-CM | POA: Diagnosis not present

## 2021-06-01 ENCOUNTER — Other Ambulatory Visit: Payer: Self-pay

## 2021-06-01 ENCOUNTER — Ambulatory Visit
Admission: RE | Admit: 2021-06-01 | Discharge: 2021-06-01 | Disposition: A | Discharge status: Home / Self Care | Payer: Medicare Other | Source: Ambulatory Visit | Attending: Radiation Oncology | Admitting: Radiation Oncology

## 2021-06-01 ENCOUNTER — Inpatient Hospital Stay: Payer: Medicare Other

## 2021-06-01 ENCOUNTER — Encounter: Payer: Self-pay | Admitting: Oncology

## 2021-06-01 VITALS — BP 117/73 | HR 82 | Temp 98.1°F | Resp 18

## 2021-06-01 DIAGNOSIS — Z510 Encounter for antineoplastic radiation therapy: Secondary | ICD-10-CM | POA: Diagnosis not present

## 2021-06-01 DIAGNOSIS — Z5111 Encounter for antineoplastic chemotherapy: Secondary | ICD-10-CM | POA: Diagnosis not present

## 2021-06-01 DIAGNOSIS — Z95828 Presence of other vascular implants and grafts: Secondary | ICD-10-CM

## 2021-06-01 DIAGNOSIS — C20 Malignant neoplasm of rectum: Secondary | ICD-10-CM

## 2021-06-01 MED ORDER — SODIUM CHLORIDE 0.9 % IV SOLN
Freq: Once | INTRAVENOUS | Status: DC
  Filled 2021-06-01: qty 20

## 2021-06-01 MED ORDER — POTASSIUM CHLORIDE 20 MEQ/100ML IV SOLN
20.0000 meq | Freq: Once | INTRAVENOUS | Status: AC
  Administered 2021-06-01: 20 meq via INTRAVENOUS

## 2021-06-01 MED ORDER — HEPARIN SOD (PORK) LOCK FLUSH 100 UNIT/ML IV SOLN
500.0000 [IU] | Freq: Once | INTRAVENOUS | Status: AC
  Administered 2021-06-01: 500 [IU] via INTRAVENOUS
  Filled 2021-06-01: qty 5

## 2021-06-01 MED ORDER — SODIUM CHLORIDE 0.9 % IV SOLN
Freq: Once | INTRAVENOUS | Status: AC
  Filled 2021-06-01: qty 4

## 2021-06-01 MED ORDER — HEPARIN SOD (PORK) LOCK FLUSH 100 UNIT/ML IV SOLN
INTRAVENOUS | Status: AC
  Filled 2021-06-01: qty 5

## 2021-06-01 MED ORDER — SODIUM CHLORIDE 0.9% FLUSH
10.0000 mL | Freq: Once | INTRAVENOUS | Status: AC
  Administered 2021-06-01: 10 mL via INTRAVENOUS
  Filled 2021-06-01: qty 10

## 2021-06-01 NOTE — Patient Instructions (Signed)
[facility]  Discharge Instructions: Thank you for choosing [facility] to provide your oncology and hematology care.  If you have a lab appointment with the [facility], please go directly to the [facility] and check in at the registration area.  Wear comfortable clothing and clothing appropriate for easy access to any Portacath or PICC line.   We strive to give you quality time with your provider. You may need to reschedule your appointment if you arrive late (15 or more minutes).  Arriving late affects you and other patients whose appointments are after yours.  Also, if you miss three or more appointments without notifying the office, you may be dismissed from the clinic at the provider's discretion.      For prescription refill requests, have your pharmacy contact our office and allow 72 hours for refills to be completed.    Today you received the following chemotherapy and/or immunotherapy agents hydration      To help prevent nausea and vomiting after your treatment, we encourage you to take your nausea medication as directed.  BELOW ARE SYMPTOMS THAT SHOULD BE REPORTED IMMEDIATELY: *FEVER GREATER THAN 100.4 F (38 C) OR HIGHER *CHILLS OR SWEATING *NAUSEA AND VOMITING THAT IS NOT CONTROLLED WITH YOUR NAUSEA MEDICATION *UNUSUAL SHORTNESS OF BREATH *UNUSUAL BRUISING OR BLEEDING *URINARY PROBLEMS (pain or burning when urinating, or frequent urination) *BOWEL PROBLEMS (unusual diarrhea, constipation, pain near the anus) TENDERNESS IN MOUTH AND THROAT WITH OR WITHOUT PRESENCE OF ULCERS (sore throat, sores in mouth, or a toothache) UNUSUAL RASH, SWELLING OR PAIN  UNUSUAL VAGINAL DISCHARGE OR ITCHING   Items with * indicate a potential emergency and should be followed up as soon as possible or go to the Emergency Department if any problems should occur.  Please show the CHEMOTHERAPY ALERT CARD or IMMUNOTHERAPY ALERT CARD at check-in  to the Emergency Department and triage nurse.  Should you have questions after your visit or need to cancel or reschedule your appointment, please contact [facility]  [phone] and follow the prompts.  Office hours are 8:00 a.m. to 4:30 p.m. Monday - Friday. Please note that voicemails left after 4:00 p.m. may not be returned until the following business day.  We are closed weekends and major holidays. You have access to a nurse at all times for urgent questions. Please call the main number to the clinic [phone] and follow the prompts.  For any non-urgent questions, you may also contact your provider using MyChart. We now offer e-Visits for anyone 75 and older to request care online for non-urgent symptoms. For details visit [email or web address].   Also download the MyChart app! Go to the app store, search "MyChart", open the app, select [facility], and log in with your MyChart username and password.  Due to Covid, a mask is required upon entering the hospital/clinic. If you do not have a mask, one will be given to you upon arrival. For doctor visits, patients may have 1 support person aged 71 or older with them. For treatment visits, patients cannot have anyone with them due to current Covid guidelines and our immunocompromised population.   Dehydration, Adult Dehydration is condition in which there is not enough water or other fluids in the body. This happens when a person loses more fluids than he or she takes in. Important body parts cannot work right without the right amount of fluids. Anyloss of fluids from the body can cause dehydration. Dehydration can  be mild, worse, or very bad. It should be treated right away tokeep it from getting very bad. What are the causes? This condition may be caused by: Conditions that cause loss of water or other fluids, such as: Watery poop (diarrhea). Vomiting. Sweating a lot. Peeing (urinating) a lot. Not  drinking enough fluids, especially when you: Are ill. Are doing things that take a lot of energy to do. Other illnesses and conditions, such as fever or infection. Certain medicines, such as medicines that take extra fluid out of the body (diuretics). Lack of safe drinking water. Not being able to get enough water and food. What increases the risk? The following factors may make you more likely to develop this condition: Having a long-term (chronic) illness that has not been treated the right way, such as: Diabetes. Heart disease. Kidney disease. Being 51 years of age or older. Having a disability. Living in a place that is high above the ground or sea (high in altitude). The thinner, dried air causes more fluid loss. Doing exercises that put stress on your body for a long time. What are the signs or symptoms? Symptoms of dehydration depend on how bad it is. Mild or worse dehydration Thirst. Dry lips or dry mouth. Feeling dizzy or light-headed, especially when you stand up from sitting. Muscle cramps. Your body making: Dark pee (urine). Pee may be the color of tea. Less pee than normal. Less tears than normal. Headache. Very bad dehydration Changes in skin. Skin may: Be cold to the touch (clammy). Be blotchy or pale. Not go back to normal right after you lightly pinch it and let it go. Little or no tears, pee, or sweat. Changes in vital signs, such as: Fast breathing. Low blood pressure. Weak pulse. Pulse that is more than 100 beats a minute when you are sitting still. Other changes, such as: Feeling very thirsty. Eyes that look hollow (sunken). Cold hands and feet. Being mixed up (confused). Being very tired (lethargic) or having trouble waking from sleep. Short-term weight loss. Loss of consciousness. How is this treated? Treatment for this condition depends on how bad it is. Treatment should start right away. Do not wait until your condition gets very bad. Very bad  dehydration is an emergency. You will need to go to a hospital. Mild or worse dehydration can be treated at home. You may be asked to: Drink more fluids. Drink an oral rehydration solution (ORS). This drink helps get the right amounts of fluids and salts and minerals in the blood (electrolytes). Very bad dehydration can be treated: With fluids through an IV tube. By getting normal levels of salts and minerals in your blood. This is often done by giving salts and minerals through a tube. The tube is passed through your nose and into your stomach. By treating the root cause. Follow these instructions at home: Oral rehydration solution If told by your doctor, drink an ORS: Make an ORS. Use instructions on the package. Start by drinking small amounts, about  cup (120 mL) every 5-10 minutes. Slowly drink more until you have had the amount that your doctor said to have. Eating and drinking        Drink enough clear fluid to keep your pee pale yellow. If you were told to drink an ORS, finish the ORS first. Then, start slowly drinking other clear fluids. Drink fluids such as: Water. Do not drink only water. Doing that can make the salt (sodium) level in your body get too  low. Water from ice chips you suck on. Fruit juice that you have added water to (diluted). Low-calorie sports drinks. Eat foods that have the right amounts of salts and minerals, such as: Bananas. Oranges. Potatoes. Tomatoes. Spinach. Do not drink alcohol. Avoid: Drinks that have a lot of sugar. These include: High-calorie sports drinks. Fruit juice that you did not add water to. Soda. Caffeine. Foods that are greasy or have a lot of fat or sugar. General instructions Take over-the-counter and prescription medicines only as told by your doctor. Do not take salt tablets. Doing that can make the salt level in your body get too high. Return to your normal activities as told by your doctor. Ask your doctor what  activities are safe for you. Keep all follow-up visits as told by your doctor. This is important. Contact a doctor if: You have pain in your belly (abdomen) and the pain: Gets worse. Stays in one place. You have a rash. You have a stiff neck. You get angry or annoyed (irritable) more easily than normal. You are more tired or have a harder time waking than normal. You feel: Weak or dizzy. Very thirsty. Get help right away if you have: Any symptoms of very bad dehydration. Symptoms of vomiting, such as: You cannot eat or drink without vomiting. Your vomiting gets worse or does not go away. Your vomit has blood or green stuff in it. Symptoms that get worse with treatment. A fever. A very bad headache. Problems with peeing or pooping (having a bowel movement), such as: Watery poop that gets worse or does not go away. Blood in your poop (stool). This may cause poop to look black and tarry. Not peeing in 6-8 hours. Peeing only a small amount of very dark pee in 6-8 hours. Trouble breathing. These symptoms may be an emergency. Do not wait to see if the symptoms will go away. Get medical help right away. Call your local emergency services (911 in the [place] Do not drive yourself to the hospital. Summary Dehydration is a condition in which there is not enough water or other fluids in the body. This happens when a person loses more fluids than he or she takes in. Treatment for this condition depends on how bad it is. Treatment should be started right away. Do not wait until your condition gets very bad. Drink enough clear fluid to keep your pee pale yellow. If you were told to drink an oral rehydration solution (ORS), finish the ORS first. Then, start slowly drinking other clear fluids. Take over-the-counter and prescription medicines only as told by your doctor. Get help right away if you have any symptoms of very bad dehydration. This information is not intended to replace advice given to  you by your health care provider. Make sure you discuss any questions you have with your healthcare provider. Document Revised: 06/04/2019 Document Reviewed: 06/04/2019 Elsevier Patient Education  [employer].

## 2021-06-02 ENCOUNTER — Inpatient Hospital Stay: Payer: Medicare Other

## 2021-07-06 ENCOUNTER — Other Ambulatory Visit: Payer: Self-pay

## 2021-07-06 ENCOUNTER — Ambulatory Visit
Admission: RE | Admit: 2021-07-06 | Discharge: 2021-07-06 | Disposition: A | Discharge status: Home / Self Care | Payer: Medicare Other | Source: Ambulatory Visit | Attending: Radiation Oncology | Admitting: Radiation Oncology

## 2021-07-06 VITALS — BP 106/71 | HR 94 | Temp 97.6°F | Resp 20 | Wt 98.1 lb

## 2021-07-06 DIAGNOSIS — K6289 Other specified diseases of anus and rectum: Secondary | ICD-10-CM

## 2021-07-06 DIAGNOSIS — G629 Polyneuropathy, unspecified: Secondary | ICD-10-CM | POA: Insufficient documentation

## 2021-07-06 DIAGNOSIS — Z9221 Personal history of antineoplastic chemotherapy: Secondary | ICD-10-CM | POA: Insufficient documentation

## 2021-07-06 DIAGNOSIS — C20 Malignant neoplasm of rectum: Secondary | ICD-10-CM | POA: Diagnosis not present

## 2021-07-06 DIAGNOSIS — Z923 Personal history of irradiation: Secondary | ICD-10-CM | POA: Insufficient documentation

## 2021-07-06 NOTE — Progress Notes (Signed)
Radiation Oncology Follow up Note  Name: Veronica Boyd   Date:   07/06/2021 MRN:  [id number] DOB: 02-04-1946    This 75 y.o. female presents to the clinic today for 1 month follow-up status post concurrent chemoradiation after FOLFOX chemotherapy for advanced rectal cancer.  REFERRING PROVIDER: Tracie Harrier, MD  HPI: Patient is a 75 year old female now at 1 month having completed concurrent chemoradiation therapy for advanced rectal cancer.  Initially tumor was 2 cm from the anal verge with a 5 cm craniocaudal extension.  Tumor extended beyond the rectovaginal recess suspected invasion of the vaginal wall.  She is seen today in routine follow-up is doing fairly well she states her bowel functions have improved.  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  She is scheduled for a PET CT scan in early September and follow-up with Dr. Grayland Ormond to discuss possible surgical intervention  COMPLICATIONS OF TREATMENT: none  FOLLOW UP COMPLIANCE: keeps appointments   PHYSICAL EXAM:  BP 106/71   Pulse 94   Temp 97.6 F (36.4 C) (Tympanic)   Resp 20   Wt 98 lb 1.6 oz (44.5 kg)   BMI 20.50 kg/m  Well-developed well-nourished patient in NAD. HEENT reveals PERLA, EOMI, discs not visualized.  Oral cavity is clear. No oral mucosal lesions are identified. Neck is clear without evidence of cervical or supraclavicular adenopathy. Lungs are clear to A&P. Cardiac examination is essentially unremarkable with regular rate and rhythm without murmur rub or thrill. Abdomen is benign with no organomegaly or masses noted. Motor sensory and DTR levels are equal and symmetric in the upper and lower extremities. Cranial nerves II through XII are grossly intact. Proprioception is intact. No peripheral adenopathy or edema is identified. No motor or sensory levels are noted. Crude visual fields are within normal range.  RADIOLOGY RESULTS: No current films for review  PLAN: Present time of asked to  see the patient back in 4 months for follow-up.  She will have a follow-up PET CT scan for evaluation response and then possible referral for surgery.  Patient continues close follow-up care with Dr. Grayland Ormond.  Patient knows to call with any concerns.  I would like to take this opportunity to thank you for allowing me to participate in the care of your patient.[name], MD

## 2021-07-15 NOTE — Progress Notes (Signed)
[place]  [phone] [phone]) [phone]  ID: Veronica Boyd OB: 12/08/1945  MR#: [id number]  [id number]  Patient Care Team: Tracie Harrier, MD as PCP - General (Internal Medicine) Lloyd Huger, MD as Consulting Physician (Oncology) Leonie Green, MD as Referring Physician (Surgery) [name], MD as Referring Physician (Radiation Oncology) Jacquelin Hawking, NP as Nurse Practitioner (Oncology) Clent Jacks, RN as Oncology Nurse Navigator   CHIEF COMPLAINT: Stage IV rectal cancer, history of follicular lymphoma.  INTERVAL HISTORY: Patient returns to clinic today for further evaluation and discussion of her imaging results.  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  She otherwise feels well.  Her diarrhea has resolved.  She has a good appetite and denies weight loss.  She has no other neurologic complaints.  She denies any recent fevers or illnesses.  She denies any chest pain, shortness of breath, cough, or hemoptysis. She denies any nausea, vomiting, or constipation.  She has no urinary complaints.  Patient offers no further specific complaints today.  REVIEW OF SYSTEMS:   Review of Systems  Constitutional: Negative.  Negative for fever, malaise/fatigue and weight loss.  Respiratory: Negative.  Negative for cough, hemoptysis and shortness of breath.   Cardiovascular: Negative.  Negative for chest pain and leg swelling.  Gastrointestinal:  Negative for abdominal pain, blood in stool, diarrhea, melena, nausea and vomiting.  Genitourinary: Negative.  Negative for dysuria.  Musculoskeletal: Negative.  Negative for back pain, joint pain and neck pain.  Skin: Negative.  Negative for rash.  Neurological:  Positive for tingling and sensory change. Negative for focal weakness, weakness and headaches.  Psychiatric/Behavioral: Negative.  The patient is not nervous/anxious and does not have insomnia.    As per  HPI. Otherwise, a complete review of systems is negative.   PAST MEDICAL HISTORY: Past Medical History:  Diagnosis Date   Arthritis    Cataract    bilateral   Chicken pox    Colon polyp    Follicular lymphoma ([place]) 08/2016   lymph nodes    Hyperlipidemia    Osteoporosis     PAST SURGICAL HISTORY: Past Surgical History:  Procedure Laterality Date   AXILLARY LYMPH NODE DISSECTION Right 08/21/2016   Procedure: AXILLARY LYMPH NODE excision;  Surgeon: Leonie Green, MD;  Location: [facility];  Service: General;  Laterality: Right;   CATARACT EXTRACTION, BILATERAL Bilateral    COLONOSCOPY N/A 09/19/2020   Procedure: COLONOSCOPY;  Surgeon: Lesly Rubenstein, MD;  Location: [facility];  Service: Endoscopy;  Laterality: N/A;   EYE SURGERY     JOINT REPLACEMENT Left    PORTA CATH INSERTION N/A 09/16/2017   Procedure: PORTA CATH INSERTION;  Surgeon: Algernon Huxley, MD;  Location: [place] [facility];  Service: Cardiovascular;  Laterality: N/A;   TONSILLECTOMY     TOTAL HIP ARTHROPLASTY Left 1992    FAMILY HISTORY: Family History  Problem Relation Age of Onset   Diabetes Sister    Lung cancer Brother    Diabetes Brother    Basal cell carcinoma Daughter    Breast cancer Paternal Aunt     ADVANCED DIRECTIVES (Y/N):  N  HEALTH MAINTENANCE: Social History   Tobacco Use   Smoking status: Never   Smokeless tobacco: Never  Vaping Use   Vaping Use: Never used  Substance Use Topics   Alcohol use: No   Drug use: No     Colonoscopy:  PAP:  Bone density:  Lipid panel:  No Known Allergies  Current Outpatient Medications  Medication Sig Dispense Refill   alendronate (FOSAMAX) 70 MG tablet TAKE 1 TABLET EVERY 7 DAYS TAKE WITH A FULL GLASS OF WATER. DO NOT LIE DOWN FOR THE NEXT 30 MIN.  3   diclofenac sodium (VOLTAREN) 1 % GEL APPLY 2 GRAMS TOPICALLY 2 (TWO) TIMES DAILY     diphenoxylate-atropine (LOMOTIL) 2.5-0.025 MG tablet Take 2 tablets by mouth 4 (four) times  daily as needed for diarrhea or loose stools. 60 tablet 1   gabapentin (NEURONTIN) 300 MG capsule Take 1 capsule (300 mg total) by mouth 2 (two) times daily. 60 capsule 2   ibandronate (BONIVA) 150 MG tablet PLEASE SEE ATTACHED FOR DETAILED DIRECTIONS     Multiple Vitamins-Minerals (CENTRUM SILVER PO) Take 1 tablet by mouth daily.     pantoprazole (PROTONIX) 20 MG tablet Take 20 mg by mouth at bedtime.      simvastatin (ZOCOR) 20 MG tablet TAKE 1 TABLET BY MOUTH EVERY DAY AT NIGHT     tizanidine (ZANAFLEX) 2 MG capsule Take 2 mg by mouth 3 (three) times daily.     traMADol-acetaminophen (ULTRACET) 37.5-325 MG tablet Take 1 tablet by mouth every 8 (eight) hours as needed.      No current facility-administered medications for this visit.   Facility-Administered Medications Ordered in Other Visits  Medication Dose Route Frequency Provider Last Rate Last Admin   heparin lock flush 100 unit/mL  500 Units Intravenous Once Grayland Ormond, Kathlene November, MD       heparin lock flush 100 unit/mL  500 Units Intracatheter PRN Grayland Ormond, Kathlene November, MD       heparin lock flush 100 unit/mL  500 Units Intravenous Once Lloyd Huger, MD       heparin lock flush 100 unit/mL  500 Units Intravenous Once Lloyd Huger, MD       sodium chloride flush (NS) 0.9 % injection 10 mL  10 mL Intravenous PRN Lloyd Huger, MD   10 mL at 12/04/17 0901   sodium chloride flush (NS) 0.9 % injection 10 mL  10 mL Intracatheter PRN Lloyd Huger, MD       sodium chloride flush (NS) 0.9 % injection 10 mL  10 mL Intravenous PRN Lloyd Huger, MD   10 mL at 04/24/21 0839   yttrium-90 injection [id number] millicurie  [id number] millicurie Intravenous Once Chrystal, Eulas Post, MD        OBJECTIVE: Vitals:   07/19/21 1106  BP: 132/70  Pulse: 85  Resp: 16  Temp: 98.1 F (36.7 C)  SpO2: 97%     Body mass index is 20.75 kg/m.    ECOG FS:0 - Asymptomatic  General: Thin, no acute distress. Eyes: Pink conjunctiva, anicteric  sclera. HEENT: Normocephalic, moist mucous membranes. Lungs: No audible wheezing or coughing. Heart: Regular rate and rhythm. Abdomen: Soft, nontender, no obvious distention. Musculoskeletal: No edema, cyanosis, or clubbing. Neuro: Alert, answering all questions appropriately. Cranial nerves grossly intact. Skin: No rashes or petechiae noted. Psych: Normal affect.  LAB RESULTS:  Lab Results  Component Value Date   NA 138 07/19/2021   K 4.2 07/19/2021   CL 103 07/19/2021   CO2 27 07/19/2021   GLUCOSE 90 07/19/2021   BUN 15 07/19/2021   CREATININE 0.52 07/19/2021   CALCIUM 9.3 07/19/2021   PROT 7.1 07/19/2021   ALBUMIN 4.2 07/19/2021   AST 22 07/19/2021   ALT 15 07/19/2021   ALKPHOS 55 07/19/2021   BILITOT 0.7 07/19/2021  GFRNONAA >60 07/19/2021   GFRAA >60 06/28/2020    Lab Results  Component Value Date   WBC 4.9 07/19/2021   NEUTROABS 3.1 07/19/2021   HGB 12.9 07/19/2021   HCT 39.2 07/19/2021   MCV 101.8 (H) 07/19/2021   PLT 162 07/19/2021     STUDIES: NM PET Image Restag (PS) Skull Base To Thigh  Result Date: 07/17/2021 CLINICAL DATA:  Subsequent treatment strategy for colorectal cancer, assess treatment response in a 75 year old female. EXAM: NUCLEAR MEDICINE PET SKULL BASE TO THIGH TECHNIQUE: 5.65 mCi F-18 FDG was injected intravenously. Full-ring PET imaging was performed from the skull base to thigh after the radiotracer. CT data was obtained and used for attenuation correction and anatomic localization. Fasting blood glucose: 87 mg/dl COMPARISON:  March 01, 2021. FINDINGS: Mediastinal blood pool activity: SUV max 1.47 Liver activity: SUV max not applicable NECK: No hypermetabolic lymph nodes in the neck. Incidental CT findings: none CHEST: Hypermetabolic RIGHT lower lobe nodule (image 94/3) 1.4 cm, formerly approximately 7 mm with a maximum SUV of 6.8, formerly 1.3 no adenopathy in the chest no additional hypermetabolic pulmonary nodules Incidental CT findings:  RIGHT-sided Port-A-Cath in-situ. Calcified coronary artery disease. Generalized atherosclerosis of the thoracic aorta. Normal caliber central pulmonary vessels. Normal heart size. No substantial pericardial effusion. Pulmonary nodule as outlined above. No consolidation or effusion. Airways are patent. Signs of prior radiation in the RIGHT peripheral upper lobe. ABDOMEN/PELVIS: No abnormal hypermetabolic activity within the liver, pancreas, adrenal glands, or spleen. No hypermetabolic lymph nodes in the abdomen or pelvis. Generalized increased activity about the rectum with increased fullness in the rectum above the pelvic floor areas difficult to assess on CT. Focal activity about the distal rectum with a maximum SUV of 9.2 as compared to 6.55. Incidental CT findings: Unremarkable appearance of liver, gallbladder, spleen, pancreas, adrenal glands and kidneys. Urinary bladder is collapsed. Pelvic structures obscured by streak artifact from LEFT hip arthroplasty. No acute gastrointestinal process. Colonic diverticulosis. Aortic atherosclerosis without aneurysm. No gross abnormality related to reproductive structures. SKELETON: No focal hypermetabolic activity to suggest skeletal metastasis. Incidental CT findings: Pectus excavatum. Spinal degenerative changes. LEFT hip arthroplasty. IMPRESSION: Suspect worsening of rectal primary with evidence of enlarging RIGHT lower lobe nodule also showing increased FDG uptake in addition to increased size. No additional signs of disease in the neck, chest, abdomen or pelvis. Aortic Atherosclerosis (ICD10-I70.0). Electronically Signed   By: Zetta Bills M.D.   On: 07/17/2021 19:56      ASSESSMENT: Stage IV rectal cancer, history of follicular lymphoma. PLAN:    1.  Stage IV rectal cancer: Biopsy confirmed rectal cancer.  MRI completed at [facility] on October 11, 2020 revealed extension of the tumor beyond the wall into the rectovaginal recess with suspected invasion  of the vaginal wall posteriorly.  Tumor also appears to involve the internal anal sphincter.  There are also numerous prominent presacral and mesorectal lymph nodes highly suspicious for malignancy.  PET scan results from November 17, 2020 reviewed independently with 3 hypermetabolic right lower lobe lung nodules consistent with pulmonary metastasis.  Repeat PET scan on March 01, 2021 reviewed independently with significant improvement of pulmonary nodules as well as known rectal primary.  Patient was discussed at tumor board and consensus was to refer to radiation oncology for consideration of concurrent chemotherapy and XRT.  She completed cycle 8 of FOLFOX on Mar 29, 2021.  She completed cycle 5 of her 5-FU pump on May 26, 2021 and XRT on June 01, 2021.  PET scan results from July 17, 2021 reviewed independently and report as above with no evidence of disease other than enlarging right lower lobe lung lesion.  Will refer back to radiation oncology for consideration of XRT.  Patient expressed understanding that she still may require additional systemic chemotherapy.  Surgical resection of residual disease in the rectum is also a possibility.  Return to clinic at the conclusion of XRT for further evaluation.   2. Recurrent follicular lymphoma: CT scan results from December 23, 2019 reviewed independently with no obvious evidence of recurrent or progressive disease.  PET scan results as above consistent with rectal cancer metastasis and no evidence of lymphoma.   Patient received Zevalin on July 30, 2017 with not much therapeutic effect.  She subsequently underwent cycles 5 of R-CHOP chemotherapy with Neulasta support completing on December 12, 2017.  Patient continues to be in complete remission.  3.  Hypokalemia: Resolved. 4.  Neutropenia: Resolved. 5.  Thrombocytopenia: Essentially resolved. 6.  Diarrhea: Resolved. 7.  Neuropathy: Patient has an appointment with neurooncology on July 21, 2021.  Patient expressed understanding and was in agreement with this plan. She also understands that She can call clinic at any time with any questions, concerns, or complaints.   Cancer Staging 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 [facility]) Staging form: Lymphoid Neoplasms, AJCC 6th Edition - Clinical stage from 08/27/2016: Stage IIE - Signed by Lloyd Huger, MD on 08/27/2016  Rectal cancer [facility]) Staging form: Colon and Rectum, AJCC 8th Edition - Clinical: Stage IVA Larwance Sachs, Noelle.Marry) - Signed by Lloyd Huger, MD on 11/02/2020   Lloyd Huger, MD   07/20/2021 5:15 PM

## 2021-07-17 ENCOUNTER — Other Ambulatory Visit: Payer: Self-pay

## 2021-07-17 ENCOUNTER — Ambulatory Visit
Admission: RE | Admit: 2021-07-17 | Discharge: 2021-07-17 | Disposition: A | Discharge status: Home / Self Care | Payer: Medicare Other | Source: Ambulatory Visit | Attending: Oncology | Admitting: Oncology

## 2021-07-17 DIAGNOSIS — I700 Atherosclerosis of aorta: Secondary | ICD-10-CM | POA: Diagnosis not present

## 2021-07-17 DIAGNOSIS — R911 Solitary pulmonary nodule: Secondary | ICD-10-CM | POA: Diagnosis not present

## 2021-07-17 DIAGNOSIS — C20 Malignant neoplasm of rectum: Secondary | ICD-10-CM | POA: Insufficient documentation

## 2021-07-17 LAB — GLUCOSE, CAPILLARY: Glucose-Capillary: 87 mg/dL (ref 70–99)

## 2021-07-17 MED ORDER — FLUDEOXYGLUCOSE F - 18 (FDG) INJECTION
5.2000 | Freq: Once | INTRAVENOUS | Status: AC
  Administered 2021-07-17: 5.65 via INTRAVENOUS

## 2021-07-19 ENCOUNTER — Inpatient Hospital Stay (HOSPITAL_BASED_OUTPATIENT_CLINIC_OR_DEPARTMENT_OTHER): Payer: Medicare Other | Admitting: Oncology

## 2021-07-19 ENCOUNTER — Other Ambulatory Visit: Payer: Self-pay

## 2021-07-19 ENCOUNTER — Inpatient Hospital Stay: Payer: Medicare Other | Attending: Oncology

## 2021-07-19 VITALS — BP 132/70 | HR 85 | Temp 98.1°F | Resp 16 | Wt 99.3 lb

## 2021-07-19 DIAGNOSIS — C20 Malignant neoplasm of rectum: Secondary | ICD-10-CM | POA: Diagnosis not present

## 2021-07-19 DIAGNOSIS — C8208 Follicular lymphoma grade I, lymph nodes of multiple sites: Secondary | ICD-10-CM | POA: Diagnosis not present

## 2021-07-19 DIAGNOSIS — C19 Malignant neoplasm of rectosigmoid junction: Secondary | ICD-10-CM | POA: Insufficient documentation

## 2021-07-19 DIAGNOSIS — G620 Drug-induced polyneuropathy: Secondary | ICD-10-CM | POA: Insufficient documentation

## 2021-07-19 DIAGNOSIS — T451X5A Adverse effect of antineoplastic and immunosuppressive drugs, initial encounter: Secondary | ICD-10-CM | POA: Diagnosis not present

## 2021-07-19 LAB — CBC WITH DIFFERENTIAL/PLATELET
Abs Immature Granulocytes: 0.02 10*3/uL (ref 0.00–0.07)
Basophils Absolute: 0 10*3/uL (ref 0.0–0.1)
Basophils Relative: 0 %
Eosinophils Absolute: 0 10*3/uL (ref 0.0–0.5)
Eosinophils Relative: 1 %
HCT: 39.2 % (ref 36.0–46.0)
Hemoglobin: 12.9 g/dL (ref 12.0–15.0)
Immature Granulocytes: 0 %
Lymphocytes Relative: 23 %
Lymphs Abs: 1.2 10*3/uL (ref 0.7–4.0)
MCH: 33.5 pg (ref 26.0–34.0)
MCHC: 32.9 g/dL (ref 30.0–36.0)
MCV: 101.8 fL — ABNORMAL HIGH (ref 80.0–100.0)
Monocytes Absolute: 0.6 10*3/uL (ref 0.1–1.0)
Monocytes Relative: 12 %
Neutro Abs: 3.1 10*3/uL (ref 1.7–7.7)
Neutrophils Relative %: 64 %
Platelets: 162 10*3/uL (ref 150–400)
RBC: 3.85 MIL/uL — ABNORMAL LOW (ref 3.87–5.11)
RDW: 12.8 % (ref 11.5–15.5)
WBC: 4.9 10*3/uL (ref 4.0–10.5)
nRBC: 0 % (ref 0.0–0.2)

## 2021-07-19 LAB — COMPREHENSIVE METABOLIC PANEL
ALT: 15 U/L (ref 0–44)
AST: 22 U/L (ref 15–41)
Albumin: 4.2 g/dL (ref 3.5–5.0)
Alkaline Phosphatase: 55 U/L (ref 38–126)
Anion gap: 8 (ref 5–15)
BUN: 15 mg/dL (ref 8–23)
CO2: 27 mmol/L (ref 22–32)
Calcium: 9.3 mg/dL (ref 8.9–10.3)
Chloride: 103 mmol/L (ref 98–111)
Creatinine, Ser: 0.52 mg/dL (ref 0.44–1.00)
GFR, Estimated: >60 mL/min (ref >60)
Glucose, Bld: 90 mg/dL (ref 70–99)
Potassium: 4.2 mmol/L (ref 3.5–5.1)
Sodium: 138 mmol/L (ref 135–145)
Total Bilirubin: 0.7 mg/dL (ref 0.3–1.2)
Total Protein: 7.1 g/dL (ref 6.5–8.1)

## 2021-07-19 MED ORDER — GABAPENTIN 300 MG PO CAPS: 300.0000 mg | ORAL_CAPSULE | Freq: Two times a day (BID) | ORAL | 2 refills | Status: DC

## 2021-07-19 MED ORDER — SODIUM CHLORIDE 0.9% FLUSH
10.0000 mL | Freq: Once | INTRAVENOUS | Status: AC
  Administered 2021-07-19: 10 mL via INTRAVENOUS
  Filled 2021-07-19: qty 10

## 2021-07-19 MED ORDER — HEPARIN SOD (PORK) LOCK FLUSH 100 UNIT/ML IV SOLN
500.0000 [IU] | Freq: Once | INTRAVENOUS | Status: AC
  Filled 2021-07-19: qty 5

## 2021-07-19 MED ORDER — HEPARIN SOD (PORK) LOCK FLUSH 100 UNIT/ML IV SOLN
INTRAVENOUS | Status: AC
  Administered 2021-07-19: 500 [IU] via INTRAVENOUS
  Filled 2021-07-19: qty 5

## 2021-07-19 NOTE — Progress Notes (Signed)
Pt c/o neuropathy in hands and feet. States "cant hang on to anything or tie my shoes. My hands feel cold and my feet feel numb."

## 2021-07-20 ENCOUNTER — Encounter: Payer: Self-pay | Admitting: Oncology

## 2021-07-21 ENCOUNTER — Inpatient Hospital Stay (HOSPITAL_BASED_OUTPATIENT_CLINIC_OR_DEPARTMENT_OTHER): Payer: Medicare Other | Admitting: Internal Medicine

## 2021-07-21 ENCOUNTER — Encounter: Payer: Self-pay | Admitting: Internal Medicine

## 2021-07-21 DIAGNOSIS — C19 Malignant neoplasm of rectosigmoid junction: Secondary | ICD-10-CM | POA: Diagnosis not present

## 2021-07-21 DIAGNOSIS — T451X5A Adverse effect of antineoplastic and immunosuppressive drugs, initial encounter: Secondary | ICD-10-CM

## 2021-07-21 DIAGNOSIS — G620 Drug-induced polyneuropathy: Secondary | ICD-10-CM

## 2021-07-21 NOTE — Progress Notes (Signed)
[facility] [place], [place] [address] [phone]   New Patient Evaluation  Date of Service: 07/21/21 Patient Name: Veronica Boyd Patient MRN: [id number] Patient DOB: 09/15/1946 Provider: Ventura Sellers, MD  Identifying Statement:  Veronica Boyd is a 75 y.o. female with Chemotherapy-induced neuropathy [facility]) who presents for initial consultation and evaluation regarding cancer associated neurologic deficits.    Referring Provider: Tracie Harrier, MD [address] [facility] [place],  [place] [address]  Primary Cancer:  Oncologic History: Oncology History Overview Note  Initial diagnosis 2014. S/p radiation 3 years ago.  Noted increase lymphadenopathy and biopsy was recommended.  Unfortunately she refused and was lost to follow-up. Return to clinic in September 2017 with increased swelling surrounding her right collarbone.  Had biopsy that was suggestive of follicular lymphoma but not definitive.  Had PET scan to complete the staging work-up.  PET scan showed hypermetabolic adenopathy in bilateral subpectoral and axillary regions, right internal mammary chain and right paratracheal and subclavicular regions.  There is no evidence of hypermetabolic lymphadenopathy within the abdomen or pelvis.  Received Zevalin on July 30, 2017 with not much therapeutic effect.  She subsequently underwent cycles 5 of R CHOP chemotherapy with Neulasta support completing on December 12, 2017.  No further intervention or treatments are needed at this time.  PET scan results from February 19, 2018 reviewed independently and reported as above with no suspicious finding for active lymphoma.      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 ([place])  07/24/2016 Initial Diagnosis   Follicular lymphoma ([place])   Rectal cancer ([place])  11/02/2020 Initial Diagnosis   Rectal cancer ([place])   11/02/2020 Cancer Staging    Staging form: Colon and Rectum, AJCC 8th Edition - Clinical: Stage IVA Larwance Sachs, Noelle.[facility]) - Signed by Lloyd Huger, MD on 11/02/2020   11/16/2020 - 03/31/2021 Chemotherapy          04/24/2021 -  Chemotherapy    Patient is on Treatment Plan: RECTAL 5FU IVCI D1-5 + XRT         History of Present Illness: The patient's records from the referring physician were obtained and reviewed and the patient interviewed to confirm this HPI.  Veronica Boyd presents today with neuropathic complaints.  She describes one month history of cold sensation in the bottoms of her feet and fingertips.  There is also presence of cramping sensation, though only affecting her right hand and right foot (more sparingly).  At times the cramping causes her to drop objects.  Cold and cramping sensations are not accompanied by burning pain, numbness, or frank  weakness.  She recently underwent chemotherapy regimen which included 8 cycles of oxaliplatin from January to May 2022.  Currently on FOLFIRI regimen with Dr. Grayland Ormond.    Medications: Current Outpatient Medications on File Prior to Visit  Medication Sig Dispense Refill   alendronate (FOSAMAX) 70 MG tablet TAKE 1 TABLET EVERY 7 DAYS TAKE WITH A FULL GLASS OF WATER. DO NOT LIE DOWN FOR THE NEXT 30 MIN.  3   diclofenac sodium (VOLTAREN) 1 % GEL APPLY 2 GRAMS TOPICALLY 2 (TWO) TIMES DAILY     diphenoxylate-atropine (LOMOTIL) 2.5-0.025 MG tablet Take 2 tablets by mouth 4 (four) times daily as needed for diarrhea or loose stools. 60 tablet 1   gabapentin (NEURONTIN) 300 MG capsule Take 1 capsule (300 mg total) by mouth 2 (two) times daily.  60 capsule 2   ibandronate (BONIVA) 150 MG tablet PLEASE SEE ATTACHED FOR DETAILED DIRECTIONS     Multiple Vitamins-Minerals (CENTRUM SILVER PO) Take 1 tablet by mouth daily.     pantoprazole (PROTONIX) 20 MG tablet Take 20 mg by mouth at bedtime.      simvastatin (ZOCOR) 20 MG tablet TAKE 1 TABLET BY MOUTH EVERY DAY AT  NIGHT     tizanidine (ZANAFLEX) 2 MG capsule Take 2 mg by mouth 3 (three) times daily.     traMADol-acetaminophen (ULTRACET) 37.5-325 MG tablet Take 1 tablet by mouth every 8 (eight) hours as needed.      [DISCONTINUED] prochlorperazine (COMPAZINE) 10 MG tablet TAKE 1 TABLET (10 MG TOTAL) BY MOUTH EVERY 6 (SIX) HOURS AS NEEDED (NAUSEA OR VOMITING). 60 tablet 1   Current Facility-Administered Medications on File Prior to Visit  Medication Dose Route Frequency Provider Last Rate Last Admin   heparin lock flush 100 unit/mL  500 Units Intravenous Once Grayland Ormond, Kathlene November, MD       heparin lock flush 100 unit/mL  500 Units Intracatheter PRN Lloyd Huger, MD       heparin lock flush 100 unit/mL  500 Units Intravenous Once Lloyd Huger, MD       heparin lock flush 100 unit/mL  500 Units Intravenous Once Lloyd Huger, MD       sodium chloride flush (NS) 0.9 % injection 10 mL  10 mL Intravenous PRN Lloyd Huger, MD   10 mL at 12/04/17 0901   sodium chloride flush (NS) 0.9 % injection 10 mL  10 mL Intracatheter PRN Lloyd Huger, MD       sodium chloride flush (NS) 0.9 % injection 10 mL  10 mL Intravenous PRN Lloyd Huger, MD   10 mL at 04/24/21 [id number]   yttrium-90 injection [id number] millicurie  [id number] millicurie Intravenous Once [name], MD        Allergies: No Known Allergies Past Medical History:  Past Medical History:  Diagnosis Date   Arthritis    Cataract    bilateral   Chicken pox    Colon polyp    Follicular lymphoma ([place]) 08/2016   lymph nodes    Hyperlipidemia    Osteoporosis    Past Surgical History:  Past Surgical History:  Procedure Laterality Date   AXILLARY LYMPH NODE DISSECTION Right 08/21/2016   Procedure: AXILLARY LYMPH NODE excision;  Surgeon: Leonie Green, MD;  Location: [facility] ORS;  Service: General;  Laterality: Right;   CATARACT EXTRACTION, BILATERAL Bilateral    COLONOSCOPY N/A 09/19/2020   Procedure: COLONOSCOPY;   Surgeon: Lesly Rubenstein, MD;  Location: [facility] ENDOSCOPY;  Service: Endoscopy;  Laterality: N/A;   EYE SURGERY     JOINT REPLACEMENT Left    PORTA CATH INSERTION N/A 09/16/2017   Procedure: PORTA CATH INSERTION;  Surgeon: Algernon Huxley, MD;  Location: [facility];  Service: Cardiovascular;  Laterality: N/A;   TONSILLECTOMY     TOTAL HIP ARTHROPLASTY Left 1992   Social History:  Social History   Socioeconomic History   Marital status: Divorced    Spouse name: Not on file   Number of children: Not on file   Years of education: Not on file   Highest education level: Not on file  Occupational History   Not on file  Tobacco Use   Smoking status: Never   Smokeless tobacco: Never  Vaping Use   Vaping Use: Never used  Substance and Sexual Activity   Alcohol use: No   Drug use: No   Sexual activity: Not on file  Other Topics Concern   Not on file  Social History Narrative   Not on file   Social Determinants of Health   Financial Resource Strain: Not on file  Food Insecurity: Not on file  Transportation Needs: Not on file  Physical Activity: Not on file  Stress: Not on file  Social Connections: Not on file  Intimate Partner Violence: Not on file   Family History:  Family History  Problem Relation Age of Onset   Diabetes Sister    Lung cancer Brother    Diabetes Brother    Basal cell carcinoma Daughter    Breast cancer Paternal Aunt     Review of Systems: Constitutional: Doesn't report fevers, chills or abnormal weight loss Eyes: Doesn't report blurriness of vision Ears, nose, mouth, throat, and face: Doesn't report sore throat Respiratory: Doesn't report cough, dyspnea or wheezes Cardiovascular: Doesn't report palpitation, chest discomfort  Gastrointestinal:  Doesn't report nausea, constipation, diarrhea GU: Doesn't report incontinence Skin: Doesn't report skin rashes Neurological: Per HPI Musculoskeletal: Doesn't report joint pain Behavioral/Psych:  Doesn't report anxiety  Physical Exam: Vitals:   07/21/21 0858  BP: (!) 116/55  Pulse: 82  Resp: 18  Temp: 99.2 F (37.3 C)  SpO2: 98%   KPS: 90. General: Alert, cooperative, pleasant, in no acute distress Head: Normal EENT: No conjunctival injection or scleral icterus.  Lungs: Resp effort normal Cardiac: Regular rate Abdomen: Non-distended abdomen Skin: No rashes cyanosis or petechiae. Extremities: No clubbing or edema  Neurologic Exam: Mental Status: Awake, alert, attentive to examiner. Oriented to self and environment. Language is fluent with intact comprehension.  Cranial Nerves: Visual acuity is grossly normal. Visual fields are full. Extra-ocular movements intact. No ptosis. Face is symmetric Motor: Tone and bulk are normal. Power is full in both arms and legs. Reflexes are symmetric, no pathologic reflexes present.  Sensory: Stocking neuropathic changes Gait: Normal.   Labs: I have reviewed the data as listed    Component Value Date/Time   NA 138 07/19/2021 1021   K 4.2 07/19/2021 1021   CL 103 07/19/2021 1021   CO2 27 07/19/2021 1021   GLUCOSE 90 07/19/2021 1021   BUN 15 07/19/2021 1021   CREATININE 0.52 07/19/2021 1021   CALCIUM 9.3 07/19/2021 1021   PROT 7.1 07/19/2021 1021   ALBUMIN 4.2 07/19/2021 1021   AST 22 07/19/2021 1021   ALT 15 07/19/2021 1021   ALKPHOS 55 07/19/2021 1021   BILITOT 0.7 07/19/2021 1021   GFRNONAA >60 07/19/2021 1021   GFRAA >60 06/28/2020 1012   Lab Results  Component Value Date   WBC 4.9 07/19/2021   NEUTROABS 3.1 07/19/2021   HGB 12.9 07/19/2021   HCT 39.2 07/19/2021   MCV 101.8 (H) 07/19/2021   PLT 162 07/19/2021    Imaging:  NM PET Image Restag (PS) Skull Base To Thigh  Result Date: 07/17/2021 CLINICAL DATA:  Subsequent treatment strategy for colorectal cancer, assess treatment response in a 75 year old female. EXAM: NUCLEAR MEDICINE PET SKULL BASE TO THIGH TECHNIQUE: 5.65 mCi F-18 FDG was injected intravenously.  Full-ring PET imaging was performed from the skull base to thigh after the radiotracer. CT data was obtained and used for attenuation correction and anatomic localization. Fasting blood glucose: 87 mg/dl COMPARISON:  March 01, 2021. FINDINGS: Mediastinal blood pool activity: SUV max 1.47 Liver activity: SUV max not applicable NECK: No hypermetabolic  lymph nodes in the neck. Incidental CT findings: none CHEST: Hypermetabolic RIGHT lower lobe nodule (image 94/3) 1.4 cm, formerly approximately 7 mm with a maximum SUV of 6.8, formerly 1.3 no adenopathy in the chest no additional hypermetabolic pulmonary nodules Incidental CT findings: RIGHT-sided Port-A-Cath in-situ. Calcified coronary artery disease. Generalized atherosclerosis of the thoracic aorta. Normal caliber central pulmonary vessels. Normal heart size. No substantial pericardial effusion. Pulmonary nodule as outlined above. No consolidation or effusion. Airways are patent. Signs of prior radiation in the RIGHT peripheral upper lobe. ABDOMEN/PELVIS: No abnormal hypermetabolic activity within the liver, pancreas, adrenal glands, or spleen. No hypermetabolic lymph nodes in the abdomen or pelvis. Generalized increased activity about the rectum with increased fullness in the rectum above the pelvic floor areas difficult to assess on CT. Focal activity about the distal rectum with a maximum SUV of 9.2 as compared to 6.55. Incidental CT findings: Unremarkable appearance of liver, gallbladder, spleen, pancreas, adrenal glands and kidneys. Urinary bladder is collapsed. Pelvic structures obscured by streak artifact from LEFT hip arthroplasty. No acute gastrointestinal process. Colonic diverticulosis. Aortic atherosclerosis without aneurysm. No gross abnormality related to reproductive structures. SKELETON: No focal hypermetabolic activity to suggest skeletal metastasis. Incidental CT findings: Pectus excavatum. Spinal degenerative changes. LEFT hip arthroplasty.  IMPRESSION: Suspect worsening of rectal primary with evidence of enlarging RIGHT lower lobe nodule also showing increased FDG uptake in addition to increased size. No additional signs of disease in the neck, chest, abdomen or pelvis. Aortic Atherosclerosis (ICD10-I70.0). Electronically Signed   By: Zetta Bills M.D.   On: 07/17/2021 19:56     Assessment/Plan Chemotherapy-induced neuropathy (H[facility])  Veronica Boyd presents with clinical syndrome consistent with symmetric, length dependent, small and large fiber peripheral neuropathy.  Etiology is exposure to chemotherapy.  We reviewed pathophysiology of chemotherapy induced neuropathy, available treatments, and goals of care.  For symptoms, recommended longer duration of gabapentin, she will dose '300mg'$  in AM and PM as prescribed by Dr. Grayland Ormond.    Gabapentin may be helpful for cramping sensations as well; if not we can add muscle relaxant or baclofen.  We will check in with her via phone in 2-3 weeks for characterization of response to gabapentin trial.  We spent twenty additional minutes teaching regarding the natural history, biology, and historical experience in the treatment of neurologic complications of cancer.   We appreciate the opportunity to participate in the care of Veronica Boyd.   All questions were answered. The patient knows to call the clinic with any problems, questions or concerns. No barriers to learning were detected.  The total time spent in the encounter was 40 minutes and more than 50% was on counseling and review of test results   Ventura Sellers, MD Medical Director of Neuro-Oncology [facility] [employer] 07/21/21 9:01 AM

## 2021-07-25 ENCOUNTER — Ambulatory Visit
Admission: RE | Admit: 2021-07-25 | Discharge: 2021-07-25 | Disposition: A | Discharge status: Home / Self Care | Payer: Medicare Other | Source: Ambulatory Visit | Attending: Radiation Oncology | Admitting: Radiation Oncology

## 2021-07-25 ENCOUNTER — Other Ambulatory Visit: Payer: Self-pay | Admitting: Oncology

## 2021-07-25 VITALS — Wt 100.4 lb

## 2021-07-25 DIAGNOSIS — R918 Other nonspecific abnormal finding of lung field: Secondary | ICD-10-CM | POA: Insufficient documentation

## 2021-07-25 DIAGNOSIS — R911 Solitary pulmonary nodule: Secondary | ICD-10-CM

## 2021-07-25 DIAGNOSIS — C20 Malignant neoplasm of rectum: Secondary | ICD-10-CM | POA: Insufficient documentation

## 2021-07-25 DIAGNOSIS — Z923 Personal history of irradiation: Secondary | ICD-10-CM | POA: Insufficient documentation

## 2021-07-25 DIAGNOSIS — Z9221 Personal history of antineoplastic chemotherapy: Secondary | ICD-10-CM | POA: Diagnosis not present

## 2021-07-25 NOTE — Progress Notes (Signed)
Radiation Oncology Follow up Note old patient new area lung   Name: Veronica Boyd   Date:   07/25/2021 MRN:  [id number] DOB: [birth year]    This 75 y.o. female presents to the clinic today for evaluation of lung lesion hypermetabolic and patient previously treated with concurrent chemoradiation therapy after FOLFOX chemotherapy for advanced stage rectal cancer.  REFERRING PROVIDER: Tracie Harrier, MD  HPI: Patient is a 75 year old female having completed FOLFOX chemotherapy as well as concurrent chemoradiation therapy for locally advanced rectal cancer approximate 2 months prior..  She had a recent PET scan from September showing slight hypermetabolic to be still in the rectal region although she does have a hypermetabolic single enlarging right lower lobe lesion consistent with either primary lung cancer or metastatic disease.  She was presented at tumor conference recommendation was made for SBRT.  She is seen today for consideration of treatment.  She is asymptomatic from a pulmonary standpoint specifically denies cough hemoptysis or chest tightness she is having no rectal pain or discomfort at this time bowel function is fairly normal.  COMPLICATIONS OF TREATMENT: none  FOLLOW UP COMPLIANCE: keeps appointments   PHYSICAL EXAM:  Wt 100 lb 6.4 oz (45.5 kg)   BMI 20.98 kg/m  Well-developed well-nourished patient in NAD. HEENT reveals PERLA, EOMI, discs not visualized.  Oral cavity is clear. No oral mucosal lesions are identified. Neck is clear without evidence of cervical or supraclavicular adenopathy. Lungs are clear to A&P. Cardiac examination is essentially unremarkable with regular rate and rhythm without murmur rub or thrill. Abdomen is benign with no organomegaly or masses noted. Motor sensory and DTR levels are equal and symmetric in the upper and lower extremities. Cranial nerves II through XII are grossly intact. Proprioception is intact. No peripheral adenopathy or edema is  identified. No motor or sensory levels are noted. Crude visual fields are within normal range.  RADIOLOGY RESULTS: PET CT scans reviewed compatible with above-stated findings  PLAN: At this time elect to go ahead with SBRT to the right lower lobe hypermetabolic lesion.  We will plan on 60 Gray in 5 fractions.  Would use motion restriction and 4-dimensional treatment planning.  Risks and benefits of treatment including low side effect profile were explained to the patient.  She comprehends my treatment plan well.  She has been set up for simulation for early next week.  I would like to take this opportunity to thank you for allowing me to participate in the care of your patient.[name], MD

## 2021-07-31 ENCOUNTER — Ambulatory Visit
Admission: RE | Admit: 2021-07-31 | Discharge: 2021-07-31 | Disposition: A | Discharge status: Home / Self Care | Payer: Medicare Other | Source: Ambulatory Visit | Attending: Radiation Oncology | Admitting: Radiation Oncology

## 2021-07-31 DIAGNOSIS — C20 Malignant neoplasm of rectum: Secondary | ICD-10-CM | POA: Insufficient documentation

## 2021-07-31 DIAGNOSIS — Z510 Encounter for antineoplastic radiation therapy: Secondary | ICD-10-CM | POA: Diagnosis present

## 2021-08-03 DIAGNOSIS — Z510 Encounter for antineoplastic radiation therapy: Secondary | ICD-10-CM | POA: Diagnosis not present

## 2021-08-08 ENCOUNTER — Ambulatory Visit
Admission: RE | Admit: 2021-08-08 | Discharge: 2021-08-08 | Disposition: A | Discharge status: Home / Self Care | Payer: Medicare Other | Source: Ambulatory Visit | Attending: Radiation Oncology | Admitting: Radiation Oncology

## 2021-08-08 DIAGNOSIS — C20 Malignant neoplasm of rectum: Secondary | ICD-10-CM | POA: Insufficient documentation

## 2021-08-08 DIAGNOSIS — C7801 Secondary malignant neoplasm of right lung: Secondary | ICD-10-CM | POA: Diagnosis present

## 2021-08-08 DIAGNOSIS — Z510 Encounter for antineoplastic radiation therapy: Secondary | ICD-10-CM | POA: Insufficient documentation

## 2021-08-08 DIAGNOSIS — Z452 Encounter for adjustment and management of vascular access device: Secondary | ICD-10-CM | POA: Diagnosis not present

## 2021-08-10 ENCOUNTER — Ambulatory Visit
Admission: RE | Admit: 2021-08-10 | Discharge: 2021-08-10 | Disposition: A | Discharge status: Home / Self Care | Payer: Medicare Other | Source: Ambulatory Visit | Attending: Radiation Oncology | Admitting: Radiation Oncology

## 2021-08-10 DIAGNOSIS — C20 Malignant neoplasm of rectum: Secondary | ICD-10-CM | POA: Diagnosis not present

## 2021-08-11 ENCOUNTER — Inpatient Hospital Stay: Payer: Medicare Other | Attending: Internal Medicine | Admitting: Internal Medicine

## 2021-08-11 DIAGNOSIS — C7801 Secondary malignant neoplasm of right lung: Secondary | ICD-10-CM | POA: Insufficient documentation

## 2021-08-11 DIAGNOSIS — T451X5A Adverse effect of antineoplastic and immunosuppressive drugs, initial encounter: Secondary | ICD-10-CM

## 2021-08-11 DIAGNOSIS — C20 Malignant neoplasm of rectum: Secondary | ICD-10-CM | POA: Insufficient documentation

## 2021-08-11 DIAGNOSIS — G620 Drug-induced polyneuropathy: Secondary | ICD-10-CM

## 2021-08-11 DIAGNOSIS — Z510 Encounter for antineoplastic radiation therapy: Secondary | ICD-10-CM | POA: Insufficient documentation

## 2021-08-11 DIAGNOSIS — Z452 Encounter for adjustment and management of vascular access device: Secondary | ICD-10-CM | POA: Insufficient documentation

## 2021-08-11 MED ORDER — GABAPENTIN 300 MG PO CAPS: 600.0000 mg | ORAL_CAPSULE | Freq: Two times a day (BID) | ORAL | 2 refills | Status: DC

## 2021-08-11 NOTE — Progress Notes (Signed)
I connected with Veronica Boyd on 08/11/21 at  9:00 AM EDT by telephone visit and verified that I am speaking with the correct person using two identifiers.  I discussed the limitations, risks, security and privacy concerns of performing an evaluation and management service by telemedicine and the availability of in-person appointments. I also discussed with the patient that there may be a patient responsible charge related to this service. The patient expressed understanding and agreed to proceed.  Other persons participating in the visit and their role in the encounter:  n/a  Patient's location:  Home  Provider's location:  Office  Chief Complaint:  Chemotherapy-induced neuropathy ([place])  History of Present Ilness: Veronica Boyd describes modest improvement in neuropathic symptoms (coldness, cramping) with sustained dosing of gabapentin 300mg  twice per day.  She does not appreciate any signficant side effects.  She still does experience both symptoms but they interfere "a little less" with day to day activities.  No other significant changes, she has undergone radiation therapy for progressive lung nodule with Dr. Donella Stade. Observations: Language and cognition at baseline Assessment and Plan: Chemotherapy-induced neuropathy ([place])  Recommended uptitrating gabapentin to 600mg  BID if tolerated.  If side effects develop, drowsiness, can change to 300mg /600mg .    Follow Up Instructions: Return to clinic as needed for refractory or progressive symptoms  I discussed the assessment and treatment plan with the patient.  The patient was provided an opportunity to ask questions and all were answered.  The patient agreed with the plan and demonstrated understanding of the instructions.    The patient was advised to call back or seek an in-person evaluation if the symptoms worsen or if the condition fails to improve as anticipated.  I provided 5-10 minutes of non-face-to-face time during this  enocunter.  Ventura Sellers, MD   I provided 15 minutes of non face-to-face telephone visit time during this encounter, and > 50% was spent counseling as documented under my assessment & plan.

## 2021-08-15 ENCOUNTER — Ambulatory Visit
Admission: RE | Admit: 2021-08-15 | Discharge: 2021-08-15 | Disposition: A | Discharge status: Home / Self Care | Payer: Medicare Other | Source: Ambulatory Visit | Attending: Radiation Oncology | Admitting: Radiation Oncology

## 2021-08-15 DIAGNOSIS — C20 Malignant neoplasm of rectum: Secondary | ICD-10-CM | POA: Diagnosis not present

## 2021-08-17 ENCOUNTER — Ambulatory Visit
Admission: RE | Admit: 2021-08-17 | Discharge: 2021-08-17 | Disposition: A | Discharge status: Home / Self Care | Payer: Medicare Other | Source: Ambulatory Visit | Attending: Radiation Oncology | Admitting: Radiation Oncology

## 2021-08-17 DIAGNOSIS — C20 Malignant neoplasm of rectum: Secondary | ICD-10-CM | POA: Diagnosis not present

## 2021-08-21 NOTE — Progress Notes (Signed)
[place]  [phone] [phone]) [phone]  ID: Veronica Boyd OB: 1946-10-16  MR#: [id number]  TMH#:[id number]  Patient Care Team: Tracie Harrier, MD as PCP - General (Internal Medicine) Lloyd Huger, MD as Consulting Physician (Oncology) Leonie Green, MD as Referring Physician (Surgery) [name], MD as Referring Physician (Radiation Oncology) Jacquelin Hawking, NP as Nurse Practitioner (Oncology) Clent Jacks, RN as Oncology Nurse Navigator   CHIEF COMPLAINT: Stage IV rectal cancer, history of follicular lymphoma.  INTERVAL HISTORY: Patient returns to clinic today at the conclusion of her XRT for further evaluation and additional diagnostic planning.  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  She has no other neurologic complaints.  She denies any recent fevers or illnesses.  She has a good appetite and denies weight loss.  She denies any chest pain, shortness of breath, cough, or hemoptysis. She denies any nausea, vomiting, constipation or diarrhea.  She has no urinary complaints.  Patient offers no further specific complaints today.  REVIEW OF SYSTEMS:   Review of Systems  Constitutional: Negative.  Negative for fever, malaise/fatigue and weight loss.  Respiratory: Negative.  Negative for cough, hemoptysis and shortness of breath.   Cardiovascular: Negative.  Negative for chest pain and leg swelling.  Gastrointestinal:  Negative for abdominal pain, blood in stool, diarrhea, melena, nausea and vomiting.  Genitourinary: Negative.  Negative for dysuria.  Musculoskeletal: Negative.  Negative for back pain, joint pain and neck pain.  Skin: Negative.  Negative for rash.  Neurological:  Positive for tingling and sensory change. Negative for focal weakness, weakness and headaches.  Psychiatric/Behavioral: Negative.  The patient is not nervous/anxious and does not have insomnia.    As  per HPI. Otherwise, a complete review of systems is negative.   PAST MEDICAL HISTORY: Past Medical History:  Diagnosis Date   Arthritis    Cataract    bilateral   Chicken pox    Colon polyp    Follicular lymphoma ([place]) 08/2016   lymph nodes    Hyperlipidemia    Osteoporosis     PAST SURGICAL HISTORY: Past Surgical History:  Procedure Laterality Date   AXILLARY LYMPH NODE DISSECTION Right 08/21/2016   Procedure: AXILLARY LYMPH NODE excision;  Surgeon: Leonie Green, MD;  Location: [facility];  Service: General;  Laterality: Right;   CATARACT EXTRACTION, BILATERAL Bilateral    COLONOSCOPY N/A 09/19/2020   Procedure: COLONOSCOPY;  Surgeon: Lesly Rubenstein, MD;  Location: [facility];  Service: Endoscopy;  Laterality: N/A;   EYE SURGERY     JOINT REPLACEMENT Left    PORTA CATH INSERTION N/A 09/16/2017   Procedure: PORTA CATH INSERTION;  Surgeon: Algernon Huxley, MD;  Location: [facility];  Service: Cardiovascular;  Laterality: N/A;   TONSILLECTOMY     TOTAL HIP ARTHROPLASTY Left 1992    FAMILY HISTORY: Family History  Problem Relation Age of Onset   Diabetes Sister    Lung cancer Brother    Diabetes Brother    Basal cell carcinoma Daughter    Breast cancer Paternal Aunt     ADVANCED DIRECTIVES (Y/N):  N  HEALTH MAINTENANCE: Social History   Tobacco Use   Smoking status: Never   Smokeless tobacco: Never  Vaping Use   Vaping Use: Never used  Substance Use Topics   Alcohol use: No   Drug use: No     Colonoscopy:  PAP:  Bone density:  Lipid panel:  No  Known Allergies  Current Outpatient Medications  Medication Sig Dispense Refill   alendronate (FOSAMAX) 70 MG tablet TAKE 1 TABLET EVERY 7 DAYS TAKE WITH A FULL GLASS OF WATER. DO NOT LIE DOWN FOR THE NEXT 30 MIN.  3   diclofenac sodium (VOLTAREN) 1 % GEL APPLY 2 GRAMS TOPICALLY 2 (TWO) TIMES DAILY     diphenoxylate-atropine (LOMOTIL) 2.5-0.025 MG tablet TAKE 2 TABLETS BY MOUTH 4 (FOUR)  TIMES DAILY AS NEEDED FOR DIARRHEA OR LOOSE STOOLS. 60 tablet 1   gabapentin (NEURONTIN) 300 MG capsule Take 2 capsules (600 mg total) by mouth 2 (two) times daily. 60 capsule 2   ibandronate (BONIVA) 150 MG tablet PLEASE SEE ATTACHED FOR DETAILED DIRECTIONS     Multiple Vitamins-Minerals (CENTRUM SILVER PO) Take 1 tablet by mouth daily.     pantoprazole (PROTONIX) 20 MG tablet Take 20 mg by mouth at bedtime.      simvastatin (ZOCOR) 20 MG tablet TAKE 1 TABLET BY MOUTH EVERY DAY AT NIGHT     tizanidine (ZANAFLEX) 2 MG capsule Take 2 mg by mouth 3 (three) times daily.     traMADol-acetaminophen (ULTRACET) 37.5-325 MG tablet Take 1 tablet by mouth every 8 (eight) hours as needed.      No current facility-administered medications for this visit.   Facility-Administered Medications Ordered in Other Visits  Medication Dose Route Frequency Provider Last Rate Last Admin   heparin lock flush 100 unit/mL  500 Units Intravenous Once Grayland Ormond, Kathlene November, MD       heparin lock flush 100 unit/mL  500 Units Intracatheter PRN Grayland Ormond, Kathlene November, MD       heparin lock flush 100 unit/mL  500 Units Intravenous Once Lloyd Huger, MD       heparin lock flush 100 unit/mL  500 Units Intravenous Once Lloyd Huger, MD       sodium chloride flush (NS) 0.9 % injection 10 mL  10 mL Intravenous PRN Lloyd Huger, MD   10 mL at 12/04/17 0901   sodium chloride flush (NS) 0.9 % injection 10 mL  10 mL Intracatheter PRN Lloyd Huger, MD       sodium chloride flush (NS) 0.9 % injection 10 mL  10 mL Intravenous PRN Lloyd Huger, MD   10 mL at 04/24/21 0839   yttrium-90 injection 59.1 millicurie  63.8 millicurie Intravenous Once Chrystal, Eulas Post, MD        OBJECTIVE: Vitals:   08/22/21 1001  BP: 118/70  Pulse: 83  Resp: 16  Temp: (!) 96.9 F (36.1 C)  SpO2: 99%     Body mass index is 21.21 kg/m.    ECOG FS:0 - Asymptomatic  General: Thin, no acute distress. Eyes: Pink conjunctiva,  anicteric sclera. HEENT: Normocephalic, moist mucous membranes. Lungs: No audible wheezing or coughing. Heart: Regular rate and rhythm. Abdomen: Soft, nontender, no obvious distention. Musculoskeletal: No edema, cyanosis, or clubbing. Neuro: Alert, answering all questions appropriately. Cranial nerves grossly intact. Skin: No rashes or petechiae noted. Psych: Normal affect.   LAB RESULTS:  Lab Results  Component Value Date   NA 138 07/19/2021   K 4.2 07/19/2021   CL 103 07/19/2021   CO2 27 07/19/2021   GLUCOSE 90 07/19/2021   BUN 15 07/19/2021   CREATININE 0.52 07/19/2021   CALCIUM 9.3 07/19/2021   PROT 7.1 07/19/2021   ALBUMIN 4.2 07/19/2021   AST 22 07/19/2021   ALT 15 07/19/2021   ALKPHOS 55 07/19/2021   BILITOT 0.7  07/19/2021   GFRNONAA >60 07/19/2021   GFRAA >60 06/28/2020    Lab Results  Component Value Date   WBC 4.9 07/19/2021   NEUTROABS 3.1 07/19/2021   HGB 12.9 07/19/2021   HCT 39.2 07/19/2021   MCV 101.8 (H) 07/19/2021   PLT 162 07/19/2021     STUDIES: No results found.    ASSESSMENT: Stage IV rectal cancer, history of follicular lymphoma. PLAN:    1.  Stage IV rectal cancer: Biopsy confirmed rectal cancer.  MRI completed at [facility] on October 11, 2020 revealed extension of the tumor beyond the wall into the rectovaginal recess with suspected invasion of the vaginal wall posteriorly.  Tumor also appears to involve the internal anal sphincter.  There are also numerous prominent presacral and mesorectal lymph nodes highly suspicious for malignancy.  PET scan results from November 17, 2020 reviewed independently with 3 hypermetabolic right lower lobe lung nodules consistent with pulmonary metastasis.  Repeat PET scan on March 01, 2021 reviewed independently with significant improvement of pulmonary nodules as well as known rectal primary.  Patient was discussed at tumor board and consensus was to refer to radiation oncology for consideration of  concurrent chemotherapy and XRT.  She completed cycle 8 of FOLFOX on Mar 29, 2021.  She completed cycle 5 of her 5-FU pump on May 26, 2021 and XRT on June 01, 2021.  PET scan results from July 17, 2021 reviewed independently and report as above with no evidence of disease other than enlarging right lower lobe lung lesion.  Patient completed SBRT to the right lung lesion on August 22, 2021.  Repeat PET scan in 6 weeks with follow-up 1 to 2 days later to discuss the results and determine whether proceeding with surgery is an option.   2. Recurrent follicular lymphoma: CT scan results from December 23, 2019 reviewed independently with no obvious evidence of recurrent or progressive disease.  PET scan results as above consistent with rectal cancer metastasis and no evidence of lymphoma.   Patient received Zevalin on July 30, 2017 with not much therapeutic effect.  She subsequently underwent cycles 5 of R-CHOP chemotherapy with Neulasta support completing on December 12, 2017.  Patient continues to be in complete remission.  3.  Hypokalemia: Resolved. 4.  Neutropenia: Resolved. 5.  Thrombocytopenia: Resolved.   6.  Diarrhea: Resolved. 7.  Neuropathy: Continue treatment and follow-up as per neuro oncology.  Patient expressed understanding and was in agreement with this plan. She also understands that She can call clinic at any time with any questions, concerns, or complaints.   Cancer Staging 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 [facility]) Staging form: Lymphoid Neoplasms, AJCC 6th Edition - Clinical stage from 08/27/2016: Stage IIE - Signed by Lloyd Huger, MD on 08/27/2016  Rectal cancer [facility]) Staging form: Colon and Rectum, AJCC 8th Edition - Clinical: Stage IVA Larwance Sachs, Noelle.Marry) - Signed by Lloyd Huger, MD on 11/02/2020   Lloyd Huger, MD   08/22/2021 10:31 AM

## 2021-08-22 ENCOUNTER — Ambulatory Visit
Admission: RE | Admit: 2021-08-22 | Discharge: 2021-08-22 | Disposition: A | Discharge status: Home / Self Care | Payer: Medicare Other | Source: Ambulatory Visit | Attending: Radiation Oncology | Admitting: Radiation Oncology

## 2021-08-22 ENCOUNTER — Inpatient Hospital Stay (HOSPITAL_BASED_OUTPATIENT_CLINIC_OR_DEPARTMENT_OTHER): Payer: Medicare Other | Admitting: Oncology

## 2021-08-22 ENCOUNTER — Other Ambulatory Visit: Payer: Self-pay

## 2021-08-22 VITALS — BP 118/70 | HR 83 | Temp 96.9°F | Resp 16 | Wt 101.5 lb

## 2021-08-22 DIAGNOSIS — C20 Malignant neoplasm of rectum: Secondary | ICD-10-CM

## 2021-08-22 NOTE — Progress Notes (Signed)
Pt c/o "pain" in fingertips but state it seems to be improving. No other concerns at this time

## 2021-08-28 ENCOUNTER — Other Ambulatory Visit: Payer: Self-pay

## 2021-08-28 ENCOUNTER — Inpatient Hospital Stay: Payer: Medicare Other

## 2021-08-28 DIAGNOSIS — C20 Malignant neoplasm of rectum: Secondary | ICD-10-CM | POA: Diagnosis not present

## 2021-08-28 DIAGNOSIS — Z95828 Presence of other vascular implants and grafts: Secondary | ICD-10-CM

## 2021-08-28 MED ORDER — SODIUM CHLORIDE 0.9% FLUSH
10.0000 mL | Freq: Once | INTRAVENOUS | Status: AC
  Administered 2021-08-28: 10 mL via INTRAVENOUS
  Filled 2021-08-28: qty 10

## 2021-08-28 MED ORDER — HEPARIN SOD (PORK) LOCK FLUSH 100 UNIT/ML IV SOLN
500.0000 [IU] | Freq: Once | INTRAVENOUS | Status: AC
  Administered 2021-08-28: 500 [IU] via INTRAVENOUS
  Filled 2021-08-28: qty 5

## 2021-09-25 ENCOUNTER — Other Ambulatory Visit: Payer: Self-pay

## 2021-09-25 ENCOUNTER — Ambulatory Visit
Admission: RE | Admit: 2021-09-25 | Discharge: 2021-09-25 | Disposition: A | Discharge status: Home / Self Care | Payer: Medicare Other | Source: Ambulatory Visit | Attending: Radiation Oncology | Admitting: Radiation Oncology

## 2021-09-25 VITALS — BP 118/73 | HR 98 | Temp 97.9°F | Wt 104.9 lb

## 2021-09-25 DIAGNOSIS — C20 Malignant neoplasm of rectum: Secondary | ICD-10-CM | POA: Diagnosis not present

## 2021-09-25 DIAGNOSIS — Z923 Personal history of irradiation: Secondary | ICD-10-CM | POA: Insufficient documentation

## 2021-09-25 DIAGNOSIS — R918 Other nonspecific abnormal finding of lung field: Secondary | ICD-10-CM | POA: Insufficient documentation

## 2021-09-25 DIAGNOSIS — R911 Solitary pulmonary nodule: Secondary | ICD-10-CM

## 2021-09-25 NOTE — Progress Notes (Signed)
Radiation Oncology Follow up Note  Name: Veronica Boyd   Date:   09/25/2021 MRN:  [id number] DOB: Oct 14, 1946    This 75 y.o. female presents to the clinic today for 1 month follow-up status post SBRT.  To her right lower lobe in patient with known local advanced rectal cancer  REFERRING PROVIDER: Tracie Harrier, MD  HPI: Patient is a 75 year old female now at 1 month having completed SBRT to her right lower lobe in patient with known locally advanced rectal cancer previously treated with FOLFOX chemotherapy plus concurrent chemoradiation.  Seen today in routine follow-up she is doing well.  She still has a slight cough otherwise very asymptomatic.  No dysphagia.  COMPLICATIONS OF TREATMENT: none  FOLLOW UP COMPLIANCE: keeps appointments  PHYSICAL EXAM:  BP 118/73   Pulse 98   Temp 97.9 F (36.6 C) (Tympanic)   Wt 104 lb 14.4 oz (47.6 kg)   BMI 21.92 kg/m  Well-developed well-nourished patient in NAD. HEENT reveals PERLA, EOMI, discs not visualized.  Oral cavity is clear. No oral mucosal lesions are identified. Neck is clear without evidence of cervical or supraclavicular adenopathy. Lungs are clear to A&P. Cardiac examination is essentially unremarkable with regular rate and rhythm without murmur rub or thrill. Abdomen is benign with no organomegaly or masses noted. Motor sensory and DTR levels are equal and symmetric in the upper and lower extremities. Cranial nerves II through XII are grossly intact. Proprioception is intact. No peripheral adenopathy or edema is identified. No motor or sensory levels are noted. Crude visual fields are within normal range.  RADIOLOGY RESULTS: No current films to review  PLAN: Present time she is doing well low side effect profile from her SBRT.  She continues observation for her rectal cancer.  PET scan results are encouraging showing really no evidence of hypermetabolic activity in the pelvis..  She continues under observation at this time I have  asked to see her back in 3 months and we will perform a CT scan if is not already been performed at that time.  Patient knows to call with any concerns.  I would like to take this opportunity to thank you for allowing me to participate in the care of your patient.[name], MD

## 2021-10-02 ENCOUNTER — Other Ambulatory Visit: Payer: Self-pay | Admitting: Oncology

## 2021-10-02 ENCOUNTER — Ambulatory Visit
Admission: RE | Admit: 2021-10-02 | Discharge: 2021-10-02 | Disposition: A | Discharge status: Home / Self Care | Payer: Medicare Other | Source: Ambulatory Visit | Attending: Oncology | Admitting: Oncology

## 2021-10-02 ENCOUNTER — Inpatient Hospital Stay: Payer: Medicare Other | Attending: Oncology

## 2021-10-02 ENCOUNTER — Other Ambulatory Visit: Payer: Self-pay

## 2021-10-02 DIAGNOSIS — I700 Atherosclerosis of aorta: Secondary | ICD-10-CM | POA: Insufficient documentation

## 2021-10-02 DIAGNOSIS — C20 Malignant neoplasm of rectum: Secondary | ICD-10-CM | POA: Insufficient documentation

## 2021-10-02 DIAGNOSIS — C7801 Secondary malignant neoplasm of right lung: Secondary | ICD-10-CM | POA: Insufficient documentation

## 2021-10-02 DIAGNOSIS — I2510 Atherosclerotic heart disease of native coronary artery without angina pectoris: Secondary | ICD-10-CM | POA: Insufficient documentation

## 2021-10-02 LAB — CBC WITH DIFFERENTIAL/PLATELET
Abs Immature Granulocytes: 0.01 10*3/uL (ref 0.00–0.07)
Basophils Absolute: 0 10*3/uL (ref 0.0–0.1)
Basophils Relative: 0 %
Eosinophils Absolute: 0.1 10*3/uL (ref 0.0–0.5)
Eosinophils Relative: 1 %
HCT: 36.9 % (ref 36.0–46.0)
Hemoglobin: 11.8 g/dL — ABNORMAL LOW (ref 12.0–15.0)
Immature Granulocytes: 0 %
Lymphocytes Relative: 18 %
Lymphs Abs: 0.8 10*3/uL (ref 0.7–4.0)
MCH: 32.2 pg (ref 26.0–34.0)
MCHC: 32 g/dL (ref 30.0–36.0)
MCV: 100.8 fL — ABNORMAL HIGH (ref 80.0–100.0)
Monocytes Absolute: 0.7 10*3/uL (ref 0.1–1.0)
Monocytes Relative: 15 %
Neutro Abs: 3 10*3/uL (ref 1.7–7.7)
Neutrophils Relative %: 66 %
Platelets: 184 10*3/uL (ref 150–400)
RBC: 3.66 MIL/uL — ABNORMAL LOW (ref 3.87–5.11)
RDW: 13.1 % (ref 11.5–15.5)
WBC: 4.5 10*3/uL (ref 4.0–10.5)
nRBC: 0 % (ref 0.0–0.2)

## 2021-10-02 LAB — COMPREHENSIVE METABOLIC PANEL
ALT: 16 U/L (ref 0–44)
AST: 25 U/L (ref 15–41)
Albumin: 4 g/dL (ref 3.5–5.0)
Alkaline Phosphatase: 57 U/L (ref 38–126)
Anion gap: 7 (ref 5–15)
BUN: 12 mg/dL (ref 8–23)
CO2: 27 mmol/L (ref 22–32)
Calcium: 8.8 mg/dL — ABNORMAL LOW (ref 8.9–10.3)
Chloride: 108 mmol/L (ref 98–111)
Creatinine, Ser: 0.71 mg/dL (ref 0.44–1.00)
GFR, Estimated: >60 mL/min (ref >60)
Glucose, Bld: 86 mg/dL (ref 70–99)
Potassium: 4.3 mmol/L (ref 3.5–5.1)
Sodium: 142 mmol/L (ref 135–145)
Total Bilirubin: 0.4 mg/dL (ref 0.3–1.2)
Total Protein: 6.6 g/dL (ref 6.5–8.1)

## 2021-10-02 LAB — GLUCOSE, CAPILLARY: Glucose-Capillary: 86 mg/dL (ref 70–99)

## 2021-10-02 MED ORDER — SODIUM CHLORIDE 0.9% FLUSH
10.0000 mL | Freq: Once | INTRAVENOUS | Status: AC
  Administered 2021-10-02: 09:00:00 10 mL via INTRAVENOUS
  Filled 2021-10-02: qty 10

## 2021-10-02 MED ORDER — FLUDEOXYGLUCOSE F - 18 (FDG) INJECTION
5.4000 | Freq: Once | INTRAVENOUS | Status: AC
  Administered 2021-10-02: 09:00:00 5.42 via INTRAVENOUS

## 2021-10-02 MED ORDER — HEPARIN SOD (PORK) LOCK FLUSH 100 UNIT/ML IV SOLN
INTRAVENOUS | Status: AC
  Filled 2021-10-02: qty 5

## 2021-10-02 MED ORDER — HEPARIN SOD (PORK) LOCK FLUSH 100 UNIT/ML IV SOLN
500.0000 [IU] | Freq: Once | INTRAVENOUS | Status: AC
  Administered 2021-10-02: 10:00:00 500 [IU] via INTRAVENOUS
  Filled 2021-10-02: qty 5

## 2021-10-02 NOTE — Progress Notes (Signed)
[name]  [phone] [phone]  ID: Veronica Boyd OB: [birth year]  MR#: [id number]  AOZ#:[id number]  Patient Care Team: Tracie Harrier, MD as PCP - General (Internal Medicine) Lloyd Huger, MD as Consulting Physician (Oncology) Leonie Green, MD as Referring Physician (Surgery) [name], MD as Referring Physician (Radiation Oncology) Jacquelin Hawking, NP as Nurse Practitioner (Oncology) Clent Jacks, RN as Oncology Nurse Navigator   CHIEF COMPLAINT: Stage IV rectal cancer, history of follicular lymphoma.  INTERVAL HISTORY: Patient returns to clinic today for further evaluation and discussion of her PET scan results.  She continues to have persistent peripheral neuropathy, but otherwise feels well. She has no other neurologic complaints.  She denies any recent fevers or illnesses.  She has a good appetite and denies weight loss.  She denies any chest pain, shortness of breath, cough, or hemoptysis. She denies any nausea, vomiting, constipation or diarrhea.  She has no urinary complaints.  Patient offers no further specific complaints today.  REVIEW OF SYSTEMS:   Review of Systems  Constitutional: Negative.  Negative for fever, malaise/fatigue and weight loss.  Respiratory: Negative.  Negative for cough, hemoptysis and shortness of breath.   Cardiovascular: Negative.  Negative for chest pain and leg swelling.  Gastrointestinal:  Negative for abdominal pain, blood in stool, diarrhea, melena, nausea and vomiting.  Genitourinary: Negative.  Negative for dysuria.  Musculoskeletal: Negative.  Negative for back pain, joint pain and neck pain.  Skin: Negative.  Negative for rash.  Neurological:  Positive for tingling and sensory change. Negative for focal weakness, weakness and headaches.  Psychiatric/Behavioral: Negative.  The patient is not nervous/anxious and does not have insomnia.    As per HPI. Otherwise, a  complete review of systems is negative.   PAST MEDICAL HISTORY: Past Medical History:  Diagnosis Date   Arthritis    Cataract    bilateral   Chicken pox    Colon polyp    Follicular lymphoma ([place]) 08/2016   lymph nodes    Hyperlipidemia    Osteoporosis     PAST SURGICAL HISTORY: Past Surgical History:  Procedure Laterality Date   AXILLARY LYMPH NODE DISSECTION Right 08/21/2016   Procedure: AXILLARY LYMPH NODE excision;  Surgeon: Leonie Green, MD;  Location: [facility] ORS;  Service: General;  Laterality: Right;   CATARACT EXTRACTION, BILATERAL Bilateral    COLONOSCOPY N/A 09/19/2020   Procedure: COLONOSCOPY;  Surgeon: Lesly Rubenstein, MD;  Location: [facility] ENDOSCOPY;  Service: Endoscopy;  Laterality: N/A;   EYE SURGERY     JOINT REPLACEMENT Left    PORTA CATH INSERTION N/A 09/16/2017   Procedure: PORTA CATH INSERTION;  Surgeon: Algernon Huxley, MD;  Location: [facility];  Service: Cardiovascular;  Laterality: N/A;   TONSILLECTOMY     TOTAL HIP ARTHROPLASTY Left 1992    FAMILY HISTORY: Family History  Problem Relation Age of Onset   Diabetes Sister    Lung cancer Brother    Diabetes Brother    Basal cell carcinoma Daughter    Breast cancer Paternal Aunt     ADVANCED DIRECTIVES (Y/N):  N  HEALTH MAINTENANCE: Social History   Tobacco Use   Smoking status: Never   Smokeless tobacco: Never  Vaping Use   Vaping Use: Never used  Substance Use Topics   Alcohol use: No   Drug use: No     Colonoscopy:  PAP:  Bone density:  Lipid panel:  No Known Allergies  Current Outpatient  Medications  Medication Sig Dispense Refill   alendronate (FOSAMAX) 70 MG tablet TAKE 1 TABLET EVERY 7 DAYS TAKE WITH A FULL GLASS OF WATER. DO NOT LIE DOWN FOR THE NEXT 30 MIN.  3   diclofenac sodium (VOLTAREN) 1 % GEL APPLY 2 GRAMS TOPICALLY 2 (TWO) TIMES DAILY     diphenoxylate-atropine (LOMOTIL) 2.5-0.025 MG tablet TAKE 2 TABLETS BY MOUTH 4 (FOUR) TIMES DAILY AS NEEDED FOR  DIARRHEA OR LOOSE STOOLS. 60 tablet 1   gabapentin (NEURONTIN) 300 MG capsule Take 2 capsules (600 mg total) by mouth 2 (two) times daily. 60 capsule 2   Multiple Vitamins-Minerals (CENTRUM SILVER PO) Take 1 tablet by mouth daily.     pantoprazole (PROTONIX) 20 MG tablet Take 1 tablet by mouth daily.     simvastatin (ZOCOR) 20 MG tablet Take by mouth.     tiZANidine (ZANAFLEX) 2 MG tablet Take 2 mg by mouth at bedtime as needed.     traMADol-acetaminophen (ULTRACET) 37.5-325 MG tablet Take 1 tablet by mouth every 8 (eight) hours as needed.      ibandronate (BONIVA) 150 MG tablet PLEASE SEE ATTACHED FOR DETAILED DIRECTIONS (Patient not taking: Reported on 10/05/2021)     No current facility-administered medications for this visit.   Facility-Administered Medications Ordered in Other Visits  Medication Dose Route Frequency Provider Last Rate Last Admin   heparin lock flush 100 unit/mL  500 Units Intravenous Once Grayland Ormond, Kathlene November, MD       heparin lock flush 100 unit/mL  500 Units Intracatheter PRN Grayland Ormond, Kathlene November, MD       heparin lock flush 100 unit/mL  500 Units Intravenous Once Lloyd Huger, MD       heparin lock flush 100 unit/mL  500 Units Intravenous Once Lloyd Huger, MD       sodium chloride flush (NS) 0.9 % injection 10 mL  10 mL Intravenous PRN Lloyd Huger, MD   10 mL at 12/04/17 0901   sodium chloride flush (NS) 0.9 % injection 10 mL  10 mL Intracatheter PRN Lloyd Huger, MD       sodium chloride flush (NS) 0.9 % injection 10 mL  10 mL Intravenous PRN Lloyd Huger, MD   10 mL at 04/24/21 0839   yttrium-90 injection 45.3 millicurie  64.6 millicurie Intravenous Once Chrystal, Eulas Post, MD        OBJECTIVE: Vitals:   10/05/21 1041  BP: (!) 103/59  Pulse: 79  Resp: 16  Temp: 97.9 F (36.6 C)  SpO2: 97%     Body mass index is 21.78 kg/m.    ECOG FS:0 - Asymptomatic  General: Thin, no acute distress. Eyes: Pink conjunctiva, anicteric  sclera. HEENT: Normocephalic, moist mucous membranes. Lungs: No audible wheezing or coughing. Heart: Regular rate and rhythm. Abdomen: Soft, nontender, no obvious distention. Musculoskeletal: No edema, cyanosis, or clubbing. Neuro: Alert, answering all questions appropriately. Cranial nerves grossly intact. Skin: No rashes or petechiae noted. Psych: Normal affect.  LAB RESULTS:  Lab Results  Component Value Date   NA 142 10/02/2021   K 4.3 10/02/2021   CL 108 10/02/2021   CO2 27 10/02/2021   GLUCOSE 86 10/02/2021   BUN 12 10/02/2021   CREATININE 0.71 10/02/2021   CALCIUM 8.8 (L) 10/02/2021   PROT 6.6 10/02/2021   ALBUMIN 4.0 10/02/2021   AST 25 10/02/2021   ALT 16 10/02/2021   ALKPHOS 57 10/02/2021   BILITOT 0.4 10/02/2021   GFRNONAA >60 10/02/2021  GFRAA >60 06/28/2020    Lab Results  Component Value Date   WBC 4.5 10/02/2021   NEUTROABS 3.0 10/02/2021   HGB 11.8 (L) 10/02/2021   HCT 36.9 10/02/2021   MCV 100.8 (H) 10/02/2021   PLT 184 10/02/2021     STUDIES: NM PET Image Restage (PS) Skull Base to Thigh (F-18 FDG)  Result Date: 10/02/2021 CLINICAL DATA:  Subsequent treatment strategy for 75 year old female with fall rectal cancer history. Post chemo radiotherapy. EXAM: NUCLEAR MEDICINE PET SKULL BASE TO THIGH TECHNIQUE: 5.42 mCi F-18 FDG was injected intravenously. Full-ring PET imaging was performed from the skull base to thigh after the radiotracer. CT data was obtained and used for attenuation correction and anatomic localization. Fasting blood glucose: 86 mg/dl COMPARISON:  Multiple prior studies most recent evaluation for comparison is July 17, 2021. FINDINGS: Mediastinal blood pool activity: SUV max 1.72 Liver activity: SUV max NA NECK: No hypermetabolic lymph nodes in the neck. Incidental CT findings: none CHEST: RIGHT lower lobe pulmonary nodule (image 95/3) this measures 9 mm as compared to 14 mm on the prior study and shows diminished FDG uptake now  with maximum SUV of 1.31 as compared to 6.83. No additional areas of increased metabolic activity in the chest. Subtle RIGHT juxtapleural nodule (image 108/3) faint density approximately 4 mm and without substantial FDG uptake unchanged over a series of prior studies. No increased metabolic activity in the chest. Incidental CT findings: RIGHT-sided Port-A-Cath at the caval to atrial junction as before. Calcified RIGHT axillary lymph node is unchanged and without FDG uptake. Heart size is stable. Aortic caliber and central pulmonary vasculature with stable appearance. Generalized aortic atherosclerosis and coronary artery disease. Limited assessment of cardiovascular structures given lack of intravenous contrast. No consolidation or pleural effusion. Pectus excavatum deformity as before. ABDOMEN/PELVIS: Increased metabolic activity in the area of the rectum is long segment, perhaps more pronounced in the low rectum with a maximum SUV of 8.39 as compared to 9.17. Still up from 6.2 in 03/01/2021. (Image 222/3) rectal thickening is noted at this level. No signs of pelvic adenopathy. No solid organ metastases. Incidental CT findings: Aortic atherosclerosis without aneurysm. Streak artifact from LEFT hip arthroplasty limiting assessment of pelvic structures. SKELETON: No focal hypermetabolic activity to suggest skeletal metastasis. Incidental CT findings: LEFT hip arthroplasty with resultant streak artifact. Spinal degenerative changes without acute or destructive bone process. IMPRESSION: Decreased size and near complete resolution of metabolic activity within a RIGHT lower lobe pulmonary nodule, currently activity less than mediastinal blood pool compatible with response to therapy. Persistent thickening of the low rectum, discrete mass cannot be visualized but with continued FDG uptake. Metabolic activity greater than in April of 2022 but decreased when compared to the most recent comparison imaging. Correlation with  direct visualization on follow-up if not recently performed, may be helpful to differentiate treatment effect from residual disease in this location. Presence of more pronounced thickening with persistent increased metabolic activity of the low rectum is suspicious. Aortic Atherosclerosis (ICD10-I70.0). Electronically Signed   By: Zetta Bills M.D.   On: 10/02/2021 13:14      ASSESSMENT: Stage IV rectal cancer, history of follicular lymphoma. PLAN:    1.  Stage IV rectal cancer: Biopsy confirmed rectal cancer.  MRI completed at [facility] on October 11, 2020 revealed extension of the tumor beyond the wall into the rectovaginal recess with suspected invasion of the vaginal wall posteriorly.  Tumor also appears to involve the internal anal sphincter.  There are also  numerous prominent presacral and mesorectal lymph nodes highly suspicious for malignancy.  PET scan results from November 17, 2020 reviewed independently with 3 hypermetabolic right lower lobe lung nodules consistent with pulmonary metastasis.  Patient completed cycle 8 of FOLFOX on Mar 29, 2021.  She then completed cycle 5 of her 5-FU pump on May 26, 2021 and XRT on June 01, 2021.  PET scan results from July 17, 2021 reviewed independently with no evidence of disease other than enlarging right lower lobe lung lesion.  Patient completed SBRT to the right lung lesion on August 22, 2021.  Repeat PET scan on July 25, 2021 reviewed independently and report as above with minimal residual disease in the rectum and likely resolution of lesions in the lung.  After lengthy discussion with the patient, her daughter as well as discussing her tumor board, patient will go back to [facility] for discussion of surgical removal and possible APR.  Patient is stated she may not want to undergo surgery, but is willing to discuss to get more information.  Follow-up after consultation at [facility].      2. Recurrent follicular lymphoma:  CT scan results from December 23, 2019 reviewed independently with no obvious evidence of recurrent or progressive disease.  PET scan results as above consistent with rectal cancer metastasis and no evidence of lymphoma.   Patient received Zevalin on July 30, 2017 with not much therapeutic effect.  She subsequently underwent cycles 5 of R-CHOP chemotherapy with Neulasta support completing on December 12, 2017.  Patient continues to be in complete remission.  3.  Neuropathy: Continue treatment and follow-up as per neuro oncology.  I spent a total of 30 minutes reviewing chart data, face-to-face evaluation with the patient, counseling and coordination of care as detailed above.   Patient expressed understanding and was in agreement with this plan. She also understands that She can call clinic at any time with any questions, concerns, or complaints.    Cancer Staging  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 [facility]) Staging form: Lymphoid Neoplasms, AJCC 6th Edition - Clinical stage from 08/27/2016: Stage IIE - Signed by Lloyd Huger, MD on 08/27/2016  Rectal cancer [facility]) Staging form: Colon and Rectum, AJCC 8th Edition - Clinical: Stage IVA Larwance Sachs, Noelle.Marry) - Signed by Lloyd Huger, MD on 11/02/2020   Lloyd Huger, MD   10/06/2021 10:11 AM

## 2021-10-05 ENCOUNTER — Other Ambulatory Visit: Payer: Self-pay

## 2021-10-05 ENCOUNTER — Inpatient Hospital Stay: Payer: Medicare Other | Attending: Oncology | Admitting: Oncology

## 2021-10-05 ENCOUNTER — Other Ambulatory Visit: Payer: Medicare Other

## 2021-10-05 ENCOUNTER — Telehealth: Payer: Self-pay | Admitting: Oncology

## 2021-10-05 ENCOUNTER — Encounter: Payer: Self-pay | Admitting: Oncology

## 2021-10-05 VITALS — BP 103/59 | HR 79 | Temp 97.9°F | Resp 16 | Wt 104.2 lb

## 2021-10-05 DIAGNOSIS — C8290 Follicular lymphoma, unspecified, unspecified site: Secondary | ICD-10-CM | POA: Diagnosis not present

## 2021-10-05 DIAGNOSIS — C20 Malignant neoplasm of rectum: Secondary | ICD-10-CM | POA: Diagnosis not present

## 2021-10-05 DIAGNOSIS — G629 Polyneuropathy, unspecified: Secondary | ICD-10-CM | POA: Insufficient documentation

## 2021-10-05 DIAGNOSIS — C7800 Secondary malignant neoplasm of unspecified lung: Secondary | ICD-10-CM | POA: Insufficient documentation

## 2021-10-05 NOTE — Progress Notes (Signed)
Tumor Board Documentation  Veronica Boyd was presented by Dr Grayland Ormond at our Tumor Board on 10/05/2021, which included representatives from medical oncology, surgical, radiology, pathology, navigation, internal medicine, radiation oncology, palliative care, research, genetics, pharmacy, pulmonology.  [name] currently presents as a current patient, for discussion with history of the following treatments: neoadjuvant chemoradiation.  Additionally, we reviewed previous medical and familial history, history of present illness, and recent lab results along with all available histopathologic and imaging studies. The tumor board considered available treatment options and made the following recommendations: Surgery (Refer to Colorectal Surgeon for possible colon resection) Refer to colorectal surgeon to discuss possible resection  The following procedures/referrals were also placed: No orders of the defined types were placed in this encounter.   Clinical Trial Status: not discussed   Staging used: AJCC Stage Group AJCC Staging: T: 4 N: 2 M: 1 Group: Stage IV Rectal Cancer   National site-specific guidelines NCCN were discussed with respect to the case.  Tumor board is a meeting of clinicians from various specialty areas who evaluate and discuss patients for whom a multidisciplinary approach is being considered. Final determinations in the plan of care are those of the provider(s). The responsibility for follow up of recommendations given during tumor board is that of the provider.   Today's extended care, comprehensive team conference, [name] was not present for the discussion and was not examined.   Multidisciplinary Tumor Board is a multidisciplinary case peer review process.  Decisions discussed in the Multidisciplinary Tumor Board reflect the opinions of the specialists present at the conference without having examined the patient.  Ultimately, treatment and diagnostic decisions rest  with the primary provider(s) and the patient.

## 2021-10-05 NOTE — Telephone Encounter (Signed)
Spoke with pt daughter regarding surgical referral. Daughter requesting both referral and colonoscopy to be scheduled so that " a more informed decision can be made." Dr Grayland Ormond made aware of pt request. Referrals initiated as requested/ordered.

## 2021-10-06 ENCOUNTER — Encounter: Payer: Self-pay | Admitting: Oncology

## 2021-10-06 NOTE — Telephone Encounter (Signed)
Referral faxed to [facility] for Dr. Haig Prophet for surgical consult and colonoscopy. Pt has seen Dr. Haig Prophet in the past. Pt daughter called and informed of location of referral.

## 2021-10-06 NOTE — Telephone Encounter (Signed)
Cancer Navigator sent info to Dr. Zollie Scale office at [facility] for colorectal surgery consult. Pt Daughter called regarding this new information.

## 2021-10-26 ENCOUNTER — Other Ambulatory Visit: Payer: Self-pay | Admitting: Oncology

## 2021-10-27 ENCOUNTER — Encounter: Payer: Self-pay | Admitting: Oncology

## 2021-11-09 ENCOUNTER — Telehealth: Payer: Self-pay

## 2021-11-09 ENCOUNTER — Ambulatory Visit
Admission: RE | Admit: 2021-11-09 | Discharge: 2021-11-09 | Disposition: A | Discharge status: Home / Self Care | Payer: 59 | Source: Ambulatory Visit | Attending: Radiation Oncology | Admitting: Radiation Oncology

## 2021-11-09 ENCOUNTER — Encounter: Payer: Self-pay | Admitting: Radiation Oncology

## 2021-11-09 ENCOUNTER — Other Ambulatory Visit: Payer: Self-pay

## 2021-11-09 ENCOUNTER — Encounter: Payer: Self-pay | Admitting: Oncology

## 2021-11-09 VITALS — BP 118/72 | HR 100 | Temp 96.8°F | Wt 102.8 lb

## 2021-11-09 DIAGNOSIS — Z85048 Personal history of other malignant neoplasm of rectum, rectosigmoid junction, and anus: Secondary | ICD-10-CM | POA: Diagnosis not present

## 2021-11-09 DIAGNOSIS — R918 Other nonspecific abnormal finding of lung field: Secondary | ICD-10-CM | POA: Insufficient documentation

## 2021-11-09 DIAGNOSIS — C20 Malignant neoplasm of rectum: Secondary | ICD-10-CM

## 2021-11-09 DIAGNOSIS — Z923 Personal history of irradiation: Secondary | ICD-10-CM | POA: Insufficient documentation

## 2021-11-09 NOTE — Telephone Encounter (Addendum)
Dr. Shawn Route 12/05/21 at 12:15. Arrive at [employer].  [place] [address]

## 2021-11-09 NOTE — Progress Notes (Signed)
Radiation Oncology Follow up Note  Name: Veronica Boyd   Date:   11/09/2021 MRN:  [id number] DOB: 1945/12/17    This 76 y.o. female presents to the clinic today for 28-month follow-up status post SBRT to her right lower lobe and patient also previously treated for locally advanced rectal cancer.  REFERRING PROVIDER: Tracie Harrier, MD  HPI: Patient is a 76 year old female now out 3 months having completed SBRT to her right lower lobe and patient with locally advanced rectal cancer previously treated with FOLFOX chemotherapy plus concurrent chemoradiation and is awaiting surgical opinion from [facility].  She specifically [place] cough hemoptysis chest tightness.  She is having frequent bowel movements although does take Imodium for that.  She is scheduled to be seen at [facility] for possible surgical resection although those appointments are pending..  She had a recent PET CT scan in November showing complete resolution of metabolic activity in the right lower lobe.  There is still some continued hypermetabolic activity in the persistent thickening low rectum.  COMPLICATIONS OF TREATMENT: none  FOLLOW UP COMPLIANCE: keeps appointments   PHYSICAL EXAM:  BP 118/72    Pulse 100    Temp (!) 96.8 F (36 C) (Tympanic)    Wt 102 lb 12.8 oz (46.6 kg)    BMI 21.49 kg/m  Well-developed well-nourished patient in NAD. HEENT reveals PERLA, EOMI, discs not visualized.  Oral cavity is clear. No oral mucosal lesions are identified. Neck is clear without evidence of cervical or supraclavicular adenopathy. Lungs are clear to A&P. Cardiac examination is essentially unremarkable with regular rate and rhythm without murmur rub or thrill. Abdomen is benign with no organomegaly or masses noted. Motor sensory and DTR levels are equal and symmetric in the upper and lower extremities. Cranial nerves II through XII are grossly intact. Proprioception is intact. No peripheral adenopathy or edema is identified. No motor or sensory  levels are noted. Crude visual fields are within normal range.  RADIOLOGY RESULTS: PET CT scan reviewed compatible with above-stated findings  PLAN: Present time we will alert Dr. Gary Fleet team that appointment with [facility] has not been established.  I have also asked to see her back in 6 months for follow-up.  Patient otherwise is doing well.  She has had a complete response to SBRT treatment.  Patient knows to call with any concerns.  I would like to take this opportunity to thank you for allowing me to participate in the care of your patient.[name], MD

## 2021-11-10 ENCOUNTER — Telehealth: Payer: Self-pay

## 2021-11-10 NOTE — Telephone Encounter (Signed)
Called and reviewed appointment details for 12/05/21 appointment with Dr. Hester Mates with daughter, Shirlean Mylar. Dr. Grayland Ormond will see her back based on decisions made at this appointment.

## 2021-11-13 ENCOUNTER — Inpatient Hospital Stay: Payer: 59 | Attending: Oncology

## 2021-11-13 ENCOUNTER — Other Ambulatory Visit: Payer: Self-pay

## 2021-11-13 DIAGNOSIS — C20 Malignant neoplasm of rectum: Secondary | ICD-10-CM | POA: Diagnosis present

## 2021-11-13 DIAGNOSIS — Z452 Encounter for adjustment and management of vascular access device: Secondary | ICD-10-CM | POA: Diagnosis present

## 2021-11-13 DIAGNOSIS — C7801 Secondary malignant neoplasm of right lung: Secondary | ICD-10-CM | POA: Diagnosis present

## 2021-11-13 DIAGNOSIS — Z95828 Presence of other vascular implants and grafts: Secondary | ICD-10-CM

## 2021-11-13 MED ORDER — SODIUM CHLORIDE 0.9% FLUSH
10.0000 mL | Freq: Once | INTRAVENOUS | Status: AC
  Administered 2021-11-13: 10 mL via INTRAVENOUS
  Filled 2021-11-13: qty 10

## 2021-11-13 MED ORDER — HEPARIN SOD (PORK) LOCK FLUSH 100 UNIT/ML IV SOLN
500.0000 [IU] | Freq: Once | INTRAVENOUS | Status: AC
  Administered 2021-11-13: 500 [IU] via INTRAVENOUS
  Filled 2021-11-13: qty 5

## 2021-11-27 ENCOUNTER — Other Ambulatory Visit: Payer: Self-pay | Admitting: Oncology

## 2021-11-28 ENCOUNTER — Encounter: Payer: Self-pay | Admitting: Oncology

## 2021-12-11 ENCOUNTER — Ambulatory Visit
Admission: RE | Admit: 2021-12-11 | Discharge: 2021-12-11 | Disposition: A | Discharge status: Home / Self Care | Payer: Self-pay | Source: Ambulatory Visit | Attending: Oncology | Admitting: Oncology

## 2021-12-11 ENCOUNTER — Other Ambulatory Visit: Payer: Self-pay

## 2021-12-11 DIAGNOSIS — C20 Malignant neoplasm of rectum: Secondary | ICD-10-CM

## 2021-12-11 NOTE — Progress Notes (Signed)
[place]  Telephone:([phone] Fax:(336) [phone]  ID: [name] OB: [birth year]  MR#: [id number]  OMV#:[id number]  Patient Care Team: Tracie Harrier, MD as PCP - General (Internal Medicine) Lloyd Huger, MD as Consulting Physician (Oncology) Leonie Green, MD as Referring Physician (Surgery) [name], MD as Referring Physician (Radiation Oncology) Jacquelin Hawking, NP as Nurse Practitioner (Oncology) Clent Jacks, RN as Oncology Nurse Navigator   CHIEF COMPLAINT: Stage IV rectal cancer, history of follicular lymphoma.  INTERVAL HISTORY: Patient returns to clinic today for further evaluation and additional diagnostic planning.  She was recently seen at [facility] for evaluation of possible surgery, but CT scan revealed a new pulmonary nodule and ultimately surgery was canceled.  Patient currently feels well.  She continues to have a chronic peripheral neuropathy.  She has no other neurologic complaints.  She denies any recent fevers or illnesses.  She has a good appetite and denies weight loss.  She denies any chest pain, shortness of breath, cough, or hemoptysis. She denies any nausea, vomiting, constipation or diarrhea.  She has no urinary complaints.  Patient offers no further specific complaints today.  REVIEW OF SYSTEMS:   Review of Systems  Constitutional: Negative.  Negative for fever, malaise/fatigue and weight loss.  Respiratory: Negative.  Negative for cough, hemoptysis and shortness of breath.   Cardiovascular: Negative.  Negative for chest pain and leg swelling.  Gastrointestinal:  Negative for abdominal pain, blood in stool, diarrhea, melena, nausea and vomiting.  Genitourinary: Negative.  Negative for dysuria.  Musculoskeletal: Negative.  Negative for back pain, joint pain and neck pain.  Skin: Negative.  Negative for rash.  Neurological:  Positive for tingling and sensory change. Negative for focal weakness,  weakness and headaches.  Psychiatric/Behavioral: Negative.  The patient is not nervous/anxious and does not have insomnia.    As per HPI. Otherwise, a complete review of systems is negative.   PAST MEDICAL HISTORY: Past Medical History:  Diagnosis Date   Arthritis    Cataract    bilateral   Chicken pox    Colon polyp    Follicular lymphoma ([place]) 08/2016   lymph nodes    Hyperlipidemia    Osteoporosis     PAST SURGICAL HISTORY: Past Surgical History:  Procedure Laterality Date   AXILLARY LYMPH NODE DISSECTION Right 08/21/2016   Procedure: AXILLARY LYMPH NODE excision;  Surgeon: Leonie Green, MD;  Location: [facility] ORS;  Service: General;  Laterality: Right;   CATARACT EXTRACTION, BILATERAL Bilateral    COLONOSCOPY N/A 09/19/2020   Procedure: COLONOSCOPY;  Surgeon: Lesly Rubenstein, MD;  Location: [facility] ENDOSCOPY;  Service: Endoscopy;  Laterality: N/A;   EYE SURGERY     JOINT REPLACEMENT Left    PORTA CATH INSERTION N/A 09/16/2017   Procedure: PORTA CATH INSERTION;  Surgeon: Algernon Huxley, MD;  Location: [place] CV LAB;  Service: Cardiovascular;  Laterality: N/A;   TONSILLECTOMY     TOTAL HIP ARTHROPLASTY Left 1992    FAMILY HISTORY: Family History  Problem Relation Age of Onset   Diabetes Sister    Lung cancer Brother    Diabetes Brother    Basal cell carcinoma Daughter    Breast cancer Paternal Aunt     ADVANCED DIRECTIVES (Y/N):  N  HEALTH MAINTENANCE: Social History   Tobacco Use   Smoking status: Never   Smokeless tobacco: Never  Vaping Use   Vaping Use: Never used  Substance Use Topics   Alcohol use:  No   Drug use: No     Colonoscopy:  PAP:  Bone density:  Lipid panel:  No Known Allergies  Current Outpatient Medications  Medication Sig Dispense Refill   alendronate (FOSAMAX) 70 MG tablet TAKE 1 TABLET EVERY 7 DAYS TAKE WITH A FULL GLASS OF WATER. DO NOT LIE DOWN FOR THE NEXT 30 MIN.  3   diclofenac sodium (VOLTAREN) 1 % GEL APPLY 2  GRAMS TOPICALLY 2 (TWO) TIMES DAILY     diphenoxylate-atropine (LOMOTIL) 2.5-0.025 MG tablet TAKE 2 TABLETS BY MOUTH 4 (FOUR) TIMES DAILY AS NEEDED FOR DIARRHEA OR LOOSE STOOLS. 60 tablet 1   gabapentin (NEURONTIN) 300 MG capsule TAKE 1 CAPSULE BY MOUTH TWICE A DAY 60 capsule 2   Multiple Vitamins-Minerals (CENTRUM SILVER PO) Take 1 tablet by mouth daily.     pantoprazole (PROTONIX) 20 MG tablet Take 1 tablet by mouth daily.     simvastatin (ZOCOR) 20 MG tablet Take by mouth.     tiZANidine (ZANAFLEX) 2 MG tablet Take 2 mg by mouth at bedtime as needed.     traMADol-acetaminophen (ULTRACET) 37.5-325 MG tablet Take 1 tablet by mouth every 8 (eight) hours as needed.      ibandronate (BONIVA) 150 MG tablet PLEASE SEE ATTACHED FOR DETAILED DIRECTIONS (Patient not taking: Reported on 10/05/2021)     No current facility-administered medications for this visit.   Facility-Administered Medications Ordered in Other Visits  Medication Dose Route Frequency Provider Last Rate Last Admin   heparin lock flush 100 unit/mL  500 Units Intravenous Once Grayland Ormond, Kathlene November, MD       heparin lock flush 100 unit/mL  500 Units Intracatheter PRN Grayland Ormond, Kathlene November, MD       heparin lock flush 100 unit/mL  500 Units Intravenous Once Lloyd Huger, MD       heparin lock flush 100 unit/mL  500 Units Intravenous Once Lloyd Huger, MD       sodium chloride flush (NS) 0.9 % injection 10 mL  10 mL Intravenous PRN Lloyd Huger, MD   10 mL at 12/04/17 0901   sodium chloride flush (NS) 0.9 % injection 10 mL  10 mL Intracatheter PRN Lloyd Huger, MD       sodium chloride flush (NS) 0.9 % injection 10 mL  10 mL Intravenous PRN Lloyd Huger, MD   10 mL at 04/24/21 0839   yttrium-90 injection 82.4 millicurie  23.5 millicurie Intravenous Once Chrystal, Eulas Post, MD        OBJECTIVE: Vitals:   12/12/21 1429  BP: 126/71  Pulse: 99  Resp: 16  Temp: (!) 97 F (36.1 C)  SpO2: 98%     Body mass  index is 21.46 kg/m.    ECOG FS:0 - Asymptomatic  General: Thin, no acute distress. Eyes: Pink conjunctiva, anicteric sclera. HEENT: Normocephalic, moist mucous membranes. Lungs: No audible wheezing or coughing. Heart: Regular rate and rhythm. Abdomen: Soft, nontender, no obvious distention. Musculoskeletal: No edema, cyanosis, or clubbing. Neuro: Alert, answering all questions appropriately. Cranial nerves grossly intact. Skin: No rashes or petechiae noted. Psych: Normal affect.   LAB RESULTS:  Lab Results  Component Value Date   NA 142 10/02/2021   K 4.3 10/02/2021   CL 108 10/02/2021   CO2 27 10/02/2021   GLUCOSE 86 10/02/2021   BUN 12 10/02/2021   CREATININE 0.71 10/02/2021   CALCIUM 8.8 (L) 10/02/2021   PROT 6.6 10/02/2021   ALBUMIN 4.0 10/02/2021   AST  25 10/02/2021   ALT 16 10/02/2021   ALKPHOS 57 10/02/2021   BILITOT 0.4 10/02/2021   GFRNONAA >60 10/02/2021   GFRAA >60 06/28/2020    Lab Results  Component Value Date   WBC 4.5 10/02/2021   NEUTROABS 3.0 10/02/2021   HGB 11.8 (L) 10/02/2021   HCT 36.9 10/02/2021   MCV 100.8 (H) 10/02/2021   PLT 184 10/02/2021     STUDIES: No results found.  ONCOLOGY HISTORY:  Biopsy confirmed rectal cancer.  MRI completed at [facility] on October 11, 2020 revealed extension of the tumor beyond the wall into the rectovaginal recess with suspected invasion of the vaginal wall posteriorly.  Tumor also appears to involve the internal anal sphincter.  There are also numerous prominent presacral and mesorectal lymph nodes highly suspicious for malignancy.  PET scan results from November 17, 2020 reviewed independently with 3 hypermetabolic right lower lobe lung nodules consistent with pulmonary metastasis.  Patient completed cycle 8 of FOLFOX on Mar 29, 2021.  She then completed cycle 5 of her 5-FU pump on May 26, 2021 and XRT on June 01, 2021.  PET scan results from July 17, 2021 reviewed independently with no evidence of  disease other than enlarging right lower lobe lung lesion.  Patient completed SBRT to the right lung lesion on August 22, 2021.  Repeat PET scan on July 25, 2021 revealed minimal residual disease in the rectum and likely resolution of lesions in the lung.    ASSESSMENT: Stage IV rectal cancer, history of follicular lymphoma. PLAN:    1.  Stage IV rectal cancer: Given the results of the PET scan from September 2022, patient was referred back to [facility] for consideration of surgery.  Patient reports the plan was to undergo surgery, but when a new lung nodule appeared on CT scan ultimately this was declined.  We will get PET scan to further evaluate.  Patient will have video-assisted telemedicine visit 1 to 2 days after PET scan to discuss the results and any treatment planning necessary.    2. Recurrent follicular lymphoma: CT scan results from December 23, 2019 reviewed independently with no obvious evidence of recurrent or progressive disease.  PET scan results as above consistent with rectal cancer metastasis and no evidence of lymphoma.   Patient received Zevalin on July 30, 2017 with not much therapeutic effect.  She subsequently underwent cycles 5 of R-CHOP chemotherapy with Neulasta support completing on December 12, 2017.  Patient continues to be in complete remission.  3.  Neuropathy: Chronic and unchanged.  I spent a total of 30 minutes reviewing chart data, face-to-face evaluation with the patient, counseling and coordination of care as detailed above.   Patient expressed understanding and was in agreement with this plan. She also understands that She can call clinic at any time with any questions, concerns, or complaints.    Cancer Staging  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 [facility]) Staging form: Lymphoid Neoplasms, AJCC 6th Edition - Clinical stage from 08/27/2016: Stage IIE - Signed by Lloyd Huger, MD on 08/27/2016  Rectal cancer  [facility] Staging form: Colon and Rectum, AJCC 8th Edition - Clinical: Stage IVA Larwance Sachs, Noelle.Marry) - Signed by Lloyd Huger, MD on 11/02/2020   Lloyd Huger, MD   12/13/2021 3:38 PM

## 2021-12-12 ENCOUNTER — Other Ambulatory Visit: Payer: Self-pay

## 2021-12-12 ENCOUNTER — Inpatient Hospital Stay: Payer: 59 | Attending: Oncology | Admitting: Oncology

## 2021-12-12 VITALS — BP 126/71 | HR 99 | Temp 97.0°F | Resp 16 | Wt 102.7 lb

## 2021-12-12 DIAGNOSIS — R911 Solitary pulmonary nodule: Secondary | ICD-10-CM | POA: Insufficient documentation

## 2021-12-12 DIAGNOSIS — C8208 Follicular lymphoma grade I, lymph nodes of multiple sites: Secondary | ICD-10-CM | POA: Insufficient documentation

## 2021-12-12 DIAGNOSIS — G629 Polyneuropathy, unspecified: Secondary | ICD-10-CM | POA: Diagnosis not present

## 2021-12-12 DIAGNOSIS — C20 Malignant neoplasm of rectum: Secondary | ICD-10-CM | POA: Diagnosis present

## 2021-12-12 DIAGNOSIS — C7801 Secondary malignant neoplasm of right lung: Secondary | ICD-10-CM | POA: Insufficient documentation

## 2021-12-12 DIAGNOSIS — Z452 Encounter for adjustment and management of vascular access device: Secondary | ICD-10-CM | POA: Diagnosis not present

## 2021-12-13 ENCOUNTER — Encounter: Payer: Self-pay | Admitting: Oncology

## 2021-12-14 ENCOUNTER — Other Ambulatory Visit: Payer: Self-pay

## 2021-12-14 ENCOUNTER — Ambulatory Visit
Admission: RE | Admit: 2021-12-14 | Discharge: 2021-12-14 | Disposition: A | Discharge status: Home / Self Care | Payer: 59 | Source: Ambulatory Visit | Attending: Oncology | Admitting: Oncology

## 2021-12-14 DIAGNOSIS — C20 Malignant neoplasm of rectum: Secondary | ICD-10-CM | POA: Insufficient documentation

## 2021-12-14 LAB — GLUCOSE, CAPILLARY: Glucose-Capillary: 84 mg/dL (ref 70–99)

## 2021-12-14 MED ORDER — FLUDEOXYGLUCOSE F - 18 (FDG) INJECTION
5.3000 | Freq: Once | INTRAVENOUS | Status: AC | PRN
  Administered 2021-12-14: 5.94 via INTRAVENOUS

## 2021-12-18 NOTE — Progress Notes (Signed)
[place]  Telephone:(336) [phone] Fax:(336) [phone]  ID: Veronica Boyd OB: August 19, 1946  MR#: [id number]  ZMO#:[id number]  Patient Care Team: Tracie Harrier, MD as PCP - General (Internal Medicine) Lloyd Huger, MD as Consulting Physician (Oncology) Leonie Green, MD as Referring Physician (Surgery) [name], MD as Referring Physician (Radiation Oncology) Jacquelin Hawking, NP as Nurse Practitioner (Oncology) Clent Jacks, RN as Oncology Nurse Navigator  I connected with Veronica Boyd on 12/20/21 at 11:15 AM EST by telephone visit and verified that I am speaking with the correct person using two identifiers.   I discussed the limitations, risks, security and privacy concerns of performing an evaluation and management service by telemedicine and the availability of in-person appointments. I also discussed with the patient that there may be a patient responsible charge related to this service. The patient expressed understanding and agreed to proceed.   Other persons participating in the visit and their role in the encounter: Patient's daughter, MD.  Patients location: Home. Providers location: Clinic.  CHIEF COMPLAINT: Stage IV rectal cancer, history of follicular lymphoma.  INTERVAL HISTORY: Initially visit was mostly video assisted, then secondary to technical difficulties with transition to telephone only.  Patient was not available at the time, therefore much of the clinic visit was spent discussing PET scan results with the daughter.  She continues to have chronic weakness and fatigue.  She continues to have a chronic peripheral neuropathy.  She has no other neurologic complaints.  She denies any recent fevers or illnesses.  She has a good appetite and denies weight loss.  She denies any chest pain, shortness of breath, cough, or hemoptysis. She denies any nausea, vomiting, constipation or diarrhea.  She has no urinary  complaints.  Patient reported no further specific complaints.  REVIEW OF SYSTEMS:   Review of Systems  Constitutional:  Positive for malaise/fatigue. Negative for fever and weight loss.  Respiratory: Negative.  Negative for cough, hemoptysis and shortness of breath.   Cardiovascular: Negative.  Negative for chest pain and leg swelling.  Gastrointestinal:  Negative for abdominal pain, blood in stool, diarrhea, melena, nausea and vomiting.  Genitourinary: Negative.  Negative for dysuria.  Musculoskeletal: Negative.  Negative for back pain, joint pain and neck pain.  Skin: Negative.  Negative for rash.  Neurological:  Positive for tingling, sensory change and weakness. Negative for focal weakness and headaches.  Psychiatric/Behavioral: Negative.  The patient is not nervous/anxious and does not have insomnia.    As per HPI. Otherwise, a complete review of systems is negative.   PAST MEDICAL HISTORY: Past Medical History:  Diagnosis Date   Arthritis    Cataract    bilateral   Chicken pox    Colon polyp    Follicular lymphoma ([place]) 08/2016   lymph nodes    Hyperlipidemia    Osteoporosis     PAST SURGICAL HISTORY: Past Surgical History:  Procedure Laterality Date   AXILLARY LYMPH NODE DISSECTION Right 08/21/2016   Procedure: AXILLARY LYMPH NODE excision;  Surgeon: Leonie Green, MD;  Location: [facility] ORS;  Service: General;  Laterality: Right;   CATARACT EXTRACTION, BILATERAL Bilateral    COLONOSCOPY N/A 09/19/2020   Procedure: COLONOSCOPY;  Surgeon: Lesly Rubenstein, MD;  Location: [facility] ENDOSCOPY;  Service: Endoscopy;  Laterality: N/A;   EYE SURGERY     JOINT REPLACEMENT Left    PORTA CATH INSERTION N/A 09/16/2017   Procedure: PORTA CATH INSERTION;  Surgeon: Algernon Huxley, MD;  Location:  [place] CV LAB;  Service: Cardiovascular;  Laterality: N/A;   TONSILLECTOMY     TOTAL HIP ARTHROPLASTY Left 1992    FAMILY HISTORY: Family History  Problem Relation Age of Onset    Diabetes Sister    Lung cancer Brother    Diabetes Brother    Basal cell carcinoma Daughter    Breast cancer Paternal Aunt     ADVANCED DIRECTIVES (Y/N):  N  HEALTH MAINTENANCE: Social History   Tobacco Use   Smoking status: Never   Smokeless tobacco: Never  Vaping Use   Vaping Use: Never used  Substance Use Topics   Alcohol use: No   Drug use: No     Colonoscopy:  PAP:  Bone density:  Lipid panel:  No Known Allergies  Current Outpatient Medications  Medication Sig Dispense Refill   alendronate (FOSAMAX) 70 MG tablet TAKE 1 TABLET EVERY 7 DAYS TAKE WITH A FULL GLASS OF WATER. DO NOT LIE DOWN FOR THE NEXT 30 MIN.  3   diclofenac sodium (VOLTAREN) 1 % GEL APPLY 2 GRAMS TOPICALLY 2 (TWO) TIMES DAILY     diphenoxylate-atropine (LOMOTIL) 2.5-0.025 MG tablet TAKE 2 TABLETS BY MOUTH 4 (FOUR) TIMES DAILY AS NEEDED FOR DIARRHEA OR LOOSE STOOLS. 60 tablet 1   gabapentin (NEURONTIN) 300 MG capsule TAKE 1 CAPSULE BY MOUTH TWICE A DAY 60 capsule 2   ibandronate (BONIVA) 150 MG tablet PLEASE SEE ATTACHED FOR DETAILED DIRECTIONS (Patient not taking: Reported on 10/05/2021)     Multiple Vitamins-Minerals (CENTRUM SILVER PO) Take 1 tablet by mouth daily.     pantoprazole (PROTONIX) 20 MG tablet Take 1 tablet by mouth daily.     simvastatin (ZOCOR) 20 MG tablet Take by mouth.     tiZANidine (ZANAFLEX) 2 MG tablet Take 1 tablet (2 mg total) by mouth at bedtime as needed. 30 tablet 1   traMADol-acetaminophen (ULTRACET) 37.5-325 MG tablet Take 1 tablet by mouth every 8 (eight) hours as needed.      No current facility-administered medications for this visit.   Facility-Administered Medications Ordered in Other Visits  Medication Dose Route Frequency Provider Last Rate Last Admin   heparin lock flush 100 unit/mL  500 Units Intravenous Once Grayland Ormond, Kathlene November, MD       heparin lock flush 100 unit/mL  500 Units Intracatheter PRN Grayland Ormond, Kathlene November, MD       heparin lock flush 100 unit/mL   500 Units Intravenous Once Lloyd Huger, MD       heparin lock flush 100 unit/mL  500 Units Intravenous Once Lloyd Huger, MD       sodium chloride flush (NS) 0.9 % injection 10 mL  10 mL Intravenous PRN Lloyd Huger, MD   10 mL at 12/04/17 0901   sodium chloride flush (NS) 0.9 % injection 10 mL  10 mL Intracatheter PRN Lloyd Huger, MD       sodium chloride flush (NS) 0.9 % injection 10 mL  10 mL Intravenous PRN Lloyd Huger, MD   10 mL at 04/24/21 0254   yttrium-90 injection 27.0 millicurie  62.3 millicurie Intravenous Once [name], MD        OBJECTIVE: There were no vitals filed for this visit.    There is no height or weight on file to calculate BMI.    ECOG FS:0 - Asymptomatic  LAB RESULTS:  Lab Results  Component Value Date   NA 142 10/02/2021   K 4.3 10/02/2021  CL 108 10/02/2021   CO2 27 10/02/2021   GLUCOSE 86 10/02/2021   BUN 12 10/02/2021   CREATININE 0.71 10/02/2021   CALCIUM 8.8 (L) 10/02/2021   PROT 6.6 10/02/2021   ALBUMIN 4.0 10/02/2021   AST 25 10/02/2021   ALT 16 10/02/2021   ALKPHOS 57 10/02/2021   BILITOT 0.4 10/02/2021   GFRNONAA >60 10/02/2021   GFRAA >60 06/28/2020    Lab Results  Component Value Date   WBC 4.5 10/02/2021   NEUTROABS 3.0 10/02/2021   HGB 11.8 (L) 10/02/2021   HCT 36.9 10/02/2021   MCV 100.8 (H) 10/02/2021   PLT 184 10/02/2021     STUDIES: NM PET Image Restag (PS) Skull Base To Thigh  Result Date: 12/20/2021 CLINICAL DATA:  Subsequent treatment strategy for rectal cancer. EXAM: NUCLEAR MEDICINE PET SKULL BASE TO THIGH TECHNIQUE: 5.94 mCi F-18 FDG was injected intravenously. Full-ring PET imaging was performed from the skull base to thigh after the radiotracer. CT data was obtained and used for attenuation correction and anatomic localization. Fasting blood glucose: 84 mg/dl COMPARISON:  PET-CT 10/02/2021 and 07/17/2021. Outside chest CT and pelvic MRI 12/05/2021 from [facility].  FINDINGS: Mediastinal blood pool activity: SUV max 1.6 Liver activity: SUV max 2.3 NECK: No hypermetabolic cervical lymph nodes are identified.There are no lesions of the pharyngeal mucosal space. Incidental CT findings: Bilateral carotid atherosclerosis. CHEST: There are no hypermetabolic mediastinal, hilar or axillary lymph nodes. A previously demonstrated right lower lobe pulmonary nodule appears grossly stable in size, although is partly obscured by increased nodular airspace opacities throughout the right lower lobe. The nodule itself measures 9 mm on image 86/3, compared with 9 mm on previous PET-CT. There is an additional nodule more medially, inferior to the right hilum, measuring up to 1.5 cm on image 95/3 which has enlarged from the prior PET-CT and is hypermetabolic with an SUV max of 3.7. There are additional nodular components in the right lower lobe which have increased in size from the prior PET-CT, although are grossly stable compared with the more recent chest CT. The surrounding patchy airspace opacities have increased from the chest CT of 9 days ago and demonstrate diffuse hypermetabolic activity (SUV max 5.3). No hypermetabolic activity or suspicious nodularity in the left lung. Incidental CT findings: There are chronic emphysematous changes and chronic subpleural scarring at the right apex, likely related to remote radiation therapy. Port-A-Cath extends to the superior cavoatrial junction. Extensive aortic, great vessel and coronary artery atherosclerosis. ABDOMEN/PELVIS: There is no hypermetabolic activity within the liver, adrenal glands, spleen or pancreas. There is no hypermetabolic nodal activity. Long segment hypermetabolic activity is again noted within the rectum, similar to previous PET-CT. This has an SUV max of 9.9 (previously 8.4). Incidental CT findings: Aortic and branch vessel atherosclerosis. Mild distal colonic diverticulosis. SKELETON: There is no hypermetabolic activity to  suggest osseous metastatic disease. Incidental CT findings: Mild arthropathic activity of the right shoulder. Pectus deformity of the sternum and previous left total hip arthroplasty are noted. IMPRESSION: 1. Nodular hypermetabolic airspace opacities throughout the right lower lobe, new from previous PET-CT and progressive from outside chest CT of 9 days ago. 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 It is difficult to address the underlying right lung nodularity, which has also progressed, and chest CT follow-up in 3 months recommended. 2. No other evidence of metastatic disease. 3. Stable hypermetabolic activity associated with the rectum, likely treatment related based on stability and interval MRI. 4.  Aortic  Atherosclerosis (ICD10-I70.0). Electronically Signed   By: Richardean Sale M.D.   On: 12/20/2021 12:25    ONCOLOGY HISTORY:  Biopsy confirmed rectal cancer.  MRI completed at [facility] on October 11, 2020 revealed extension of the tumor beyond the wall into the rectovaginal recess with suspected invasion of the vaginal wall posteriorly.  Tumor also appears to involve the internal anal sphincter.  There are also numerous prominent presacral and mesorectal lymph nodes highly suspicious for malignancy.  PET scan results from November 17, 2020 reviewed independently with 3 hypermetabolic right lower lobe lung nodules consistent with pulmonary metastasis.  Patient completed cycle 8 of FOLFOX on Mar 29, 2021.  She then completed cycle 5 of her 5-FU pump on May 26, 2021 and XRT on June 01, 2021.  PET scan results from July 17, 2021 reviewed independently with no evidence of disease other than enlarging right lower lobe lung lesion.  Patient completed SBRT to the right lung lesion on August 22, 2021.  Repeat PET scan on July 25, 2021 revealed minimal residual disease in the rectum and likely resolution of lesions in the lung.    ASSESSMENT: Stage IV rectal  cancer, history of follicular lymphoma. PLAN:    1.  Stage IV rectal cancer: Given the results of the PET scan from September 2022, patient was referred back to [facility] for consideration of surgery.  Patient reports the plan was to undergo surgery, but when a new lung nodule appeared on CT scan ultimately this was declined.  PET scan results from February 9 reviewed independently and reported as above with concern for progression of disease but appears to be nothing treatable at this time.  Patient's daughter did agree that active surveillance is the best course of action at this time, therefore we will get CT of the chest in 3 months to assess for interval change.  She expressed understanding that treatment will likely be necessary in the future.   2. Recurrent follicular lymphoma: CT scan results from December 23, 2019 reviewed independently with no obvious evidence of recurrent or progressive disease.  PET scan results as above consistent with rectal cancer metastasis and no evidence of lymphoma.   Patient received Zevalin on July 30, 2017 with not much therapeutic effect.  She subsequently underwent cycles 5 of R-CHOP chemotherapy with Neulasta support completing on December 12, 2017.  Patient continues to be in complete remission.  3.  Neuropathy: Chronic and unchanged. 4.  Weakness and fatigue: Chronic and unchanged.  I provided 20 minutes of non face-to-face telephone visit time during this encounter which included chart review, counseling, and coordination of care as documented above.   Patient expressed understanding and was in agreement with this plan. She also understands that She can call clinic at any time with any questions, concerns, or complaints.    Cancer Staging  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 [facility]) Staging form: Lymphoid Neoplasms, AJCC 6th Edition - Clinical stage from 08/27/2016: Stage IIE - Signed by Lloyd Huger, MD on  08/27/2016  Rectal cancer [facility]) Staging form: Colon and Rectum, AJCC 8th Edition - Clinical: Stage IVA Larwance Sachs, Noelle.Marry) - Signed by Lloyd Huger, MD on 11/02/2020   Lloyd Huger, MD   12/20/2021 1:40 PM

## 2021-12-19 ENCOUNTER — Other Ambulatory Visit: Payer: Self-pay | Admitting: Emergency Medicine

## 2021-12-19 DIAGNOSIS — C20 Malignant neoplasm of rectum: Secondary | ICD-10-CM

## 2021-12-19 NOTE — Progress Notes (Signed)
Daughter asking for refill of Zanaflex for patient. No other concerns at this time.

## 2021-12-20 ENCOUNTER — Inpatient Hospital Stay (HOSPITAL_BASED_OUTPATIENT_CLINIC_OR_DEPARTMENT_OTHER): Payer: 59 | Admitting: Oncology

## 2021-12-20 ENCOUNTER — Other Ambulatory Visit: Payer: Self-pay

## 2021-12-20 DIAGNOSIS — C20 Malignant neoplasm of rectum: Secondary | ICD-10-CM | POA: Diagnosis not present

## 2021-12-20 MED ORDER — TIZANIDINE HCL 2 MG PO TABS: 2.0000 mg | ORAL_TABLET | Freq: Every evening | ORAL | 1 refills | Status: DC | PRN

## 2021-12-26 ENCOUNTER — Telehealth: Payer: Self-pay | Admitting: *Deleted

## 2021-12-26 ENCOUNTER — Encounter: Payer: Self-pay | Admitting: *Deleted

## 2021-12-26 NOTE — Telephone Encounter (Signed)
FMLA Forms  for patient received 12/25/21. Completed forms- pending md signature.  Documents will be faxed to [employer]. I will send daughter a mychart msg once forms are completely processed.

## 2021-12-26 NOTE — Telephone Encounter (Signed)
Faxed forms to [employer]. Copy of form sent to chart.

## 2021-12-28 ENCOUNTER — Other Ambulatory Visit: Payer: Self-pay | Admitting: Oncology

## 2022-01-01 ENCOUNTER — Other Ambulatory Visit: Payer: Self-pay

## 2022-01-01 ENCOUNTER — Inpatient Hospital Stay: Payer: 59

## 2022-01-01 DIAGNOSIS — C20 Malignant neoplasm of rectum: Secondary | ICD-10-CM | POA: Diagnosis not present

## 2022-01-01 DIAGNOSIS — Z95828 Presence of other vascular implants and grafts: Secondary | ICD-10-CM

## 2022-01-01 MED ORDER — SODIUM CHLORIDE 0.9% FLUSH
10.0000 mL | Freq: Once | INTRAVENOUS | Status: AC
  Administered 2022-01-01: 10 mL via INTRAVENOUS
  Filled 2022-01-01: qty 10

## 2022-01-01 MED ORDER — HEPARIN SOD (PORK) LOCK FLUSH 100 UNIT/ML IV SOLN
500.0000 [IU] | Freq: Once | INTRAVENOUS | Status: AC
  Administered 2022-01-01: 500 [IU] via INTRAVENOUS
  Filled 2022-01-01: qty 5

## 2022-02-19 ENCOUNTER — Inpatient Hospital Stay: Payer: 59 | Attending: Oncology

## 2022-02-21 ENCOUNTER — Other Ambulatory Visit: Payer: Self-pay | Admitting: Oncology

## 2022-03-18 NOTE — Progress Notes (Signed)
[facility]  Telephone:([phone]) [phone] Fax:(336) [phone]  ID: [name] OB: 03-17-1946  MR#: [id number]  RXV#:[id number]  Patient Care Team: Tracie Harrier, MD as PCP - General (Internal Medicine) Lloyd Huger, MD as Consulting Physician (Oncology) [name], MD as Referring Physician (Radiation Oncology) Jacquelin Hawking, NP as Nurse Practitioner (Oncology) Clent Jacks, RN as Oncology Nurse Navigator  CHIEF COMPLAINT: Stage IV rectal cancer, history of follicular lymphoma.  INTERVAL HISTORY: Patient returns to clinic today for further evaluation and discussion of her imaging results.  She currently feels well and is asymptomatic.  She does not complain of any weakness or fatigue today.  She continues to have a chronic peripheral neuropathy.  She has no other neurologic complaints.  She denies any recent fevers or illnesses.  She has a good appetite and denies weight loss.  She denies any chest pain, shortness of breath, cough, or hemoptysis. She denies any nausea, vomiting, constipation or diarrhea.  She has no urinary complaints.  Patient offers no further specific complaints today.  REVIEW OF SYSTEMS:   Review of Systems  Constitutional: Negative.  Negative for fever, malaise/fatigue and weight loss.  Respiratory: Negative.  Negative for cough, hemoptysis and shortness of breath.   Cardiovascular: Negative.  Negative for chest pain and leg swelling.  Gastrointestinal:  Negative for abdominal pain, blood in stool, diarrhea, melena, nausea and vomiting.  Genitourinary: Negative.  Negative for dysuria.  Musculoskeletal: Negative.  Negative for back pain, joint pain and neck pain.  Skin: Negative.  Negative for rash.  Neurological:  Positive for tingling and sensory change. Negative for focal weakness, weakness and headaches.  Psychiatric/Behavioral: Negative.  The patient is not nervous/anxious and does not have insomnia.    As per HPI.  Otherwise, a complete review of systems is negative.   PAST MEDICAL HISTORY: Past Medical History:  Diagnosis Date   Arthritis    Cataract    bilateral   Chicken pox    Colon polyp    Follicular lymphoma ([place]) 08/2016   lymph nodes    Hyperlipidemia    Osteoporosis    Rectal carcinoma ([place])     PAST SURGICAL HISTORY: Past Surgical History:  Procedure Laterality Date   AXILLARY LYMPH NODE DISSECTION Right 08/21/2016   Procedure: AXILLARY LYMPH NODE excision;  Surgeon: Leonie Green, MD;  Location: [facility] ORS;  Service: General;  Laterality: Right;   CATARACT EXTRACTION, BILATERAL Bilateral    COLONOSCOPY N/A 09/19/2020   Procedure: COLONOSCOPY;  Surgeon: Lesly Rubenstein, MD;  Location: [facility] END[facility];  Service: Endoscopy;  Laterality: N/A;   EYE SURGERY     JOINT REPLACEMENT Left    PORTA CATH INSERTION N/A 09/16/2017   Procedure: PORTA CATH INSERTION;  Surgeon: Algernon Huxley, MD;  Location: [facility];  Service: Cardiovascular;  Laterality: N/A;   TONSILLECTOMY     TOTAL HIP ARTHROPLASTY Left 1992    FAMILY HISTORY: Family History  Problem Relation Age of Onset   Diabetes Sister    Lung cancer Brother    Diabetes Brother    Basal cell carcinoma Daughter    Breast cancer Paternal Aunt     ADVANCED DIRECTIVES (Y/N):  N  HEALTH MAINTENANCE: Social History   Tobacco Use   Smoking status: Never   Smokeless tobacco: Never  Vaping Use   Vaping Use: Never used  Substance Use Topics   Alcohol use: No   Drug use: No     Colonoscopy:  PAP:  Bone density:  Lipid panel:  No Known Allergies  Current Outpatient Medications  Medication Sig Dispense Refill   alendronate (FOSAMAX) 70 MG tablet TAKE 1 TABLET EVERY 7 DAYS TAKE WITH A FULL GLASS OF WATER. DO NOT LIE DOWN FOR THE NEXT 30 MIN.  3   diclofenac sodium (VOLTAREN) 1 % GEL APPLY 2 GRAMS TOPICALLY 2 (TWO) TIMES DAILY     diphenoxylate-atropine (LOMOTIL) 2.5-0.025 MG tablet TAKE 2 TABLETS BY  MOUTH 4 (FOUR) TIMES DAILY AS NEEDED FOR DIARRHEA OR LOOSE STOOLS. 60 tablet 1   ibuprofen (ADVIL) 400 MG tablet Take 400 mg by mouth every 6 (six) hours as needed.     Multiple Vitamins-Minerals (CENTRUM SILVER PO) Take 1 tablet by mouth daily.     pantoprazole (PROTONIX) 20 MG tablet Take 1 tablet by mouth daily.     tiZANidine (ZANAFLEX) 2 MG tablet Take 1 tablet (2 mg total) by mouth at bedtime as needed. 30 tablet 1   traMADol-acetaminophen (ULTRACET) 37.5-325 MG tablet Take 1 tablet by mouth every 8 (eight) hours as needed.      gabapentin (NEURONTIN) 300 MG capsule Take 1 capsule (300 mg total) by mouth 2 (two) times daily. 60 capsule 2   No current facility-administered medications for this visit.   Facility-Administered Medications Ordered in Other Visits  Medication Dose Route Frequency Provider Last Rate Last Admin   heparin lock flush 100 unit/mL  500 Units Intravenous Once Grayland Ormond, Kathlene November, MD       heparin lock flush 100 unit/mL  500 Units Intracatheter PRN Grayland Ormond, Kathlene November, MD       heparin lock flush 100 unit/mL  500 Units Intravenous Once Lloyd Huger, MD       heparin lock flush 100 unit/mL  500 Units Intravenous Once Lloyd Huger, MD       sodium chloride flush (NS) 0.9 % injection 10 mL  10 mL Intravenous PRN Lloyd Huger, MD   10 mL at 12/04/17 0901   sodium chloride flush (NS) 0.9 % injection 10 mL  10 mL Intracatheter PRN Lloyd Huger, MD       sodium chloride flush (NS) 0.9 % injection 10 mL  10 mL Intravenous PRN Lloyd Huger, MD   10 mL at 04/24/21 0839   yttrium-90 injection 15.1 millicurie  76.1 millicurie Intravenous Once Chrystal, Eulas Post, MD        OBJECTIVE: Vitals:   03/22/22 1122  BP: 124/83  Pulse: 90  Resp: 16  Temp: 98.1 F (36.7 C)      Body mass index is 21.46 kg/m.    ECOG FS:0 - Asymptomatic  LAB RESULTS:  Lab Results  Component Value Date   NA 138 03/19/2022   K 4.0 03/19/2022   CL 103 03/19/2022    CO2 27 03/19/2022   GLUCOSE 96 03/19/2022   BUN 12 03/19/2022   CREATININE 0.61 03/19/2022   CALCIUM 9.1 03/19/2022   PROT 7.2 03/19/2022   ALBUMIN 4.0 03/19/2022   AST 21 03/19/2022   ALT 13 03/19/2022   ALKPHOS 65 03/19/2022   BILITOT 0.7 03/19/2022   GFRNONAA >60 03/19/2022   GFRAA >60 06/28/2020    Lab Results  Component Value Date   WBC 5.2 03/19/2022   NEUTROABS 2.9 03/19/2022   HGB 13.2 03/19/2022   HCT 41.1 03/19/2022   MCV 94.7 03/19/2022   PLT 209 03/19/2022     STUDIES: CT CHEST W CONTRAST  Result Date: 03/20/2022 CLINICAL DATA:  Follow-up rectal carcinoma. Post chemo radiation therapy. Airspace densities in the RIGHT lung on most recent PET-CT scan. * Tracking Code: BO * EXAM: CT CHEST WITH CONTRAST TECHNIQUE: Multidetector CT imaging of the chest was performed during intravenous contrast administration. RADIATION DOSE REDUCTION: This exam was performed according to the departmental dose-optimization program which includes automated exposure control, adjustment of the mA and/or kV according to patient size and/or use of iterative reconstruction technique. CONTRAST:  15m OMNIPAQUE IOHEXOL 300 MG/ML  SOLN COMPARISON:  PET-CT 12/14/2021 FINDINGS: Cardiovascular: Port in the anterior chest wall with tip in distal SVC. Mediastinum/Nodes: No axillary or supraclavicular adenopathy. No mediastinal or hilar adenopathy. No pericardial fluid. Esophagus normal. Lungs/Pleura: Again demonstrated a region of parenchymal consolidation with air bronchograms in the RIGHT lower lobe which is more demarcated than comparison PET-CT scan where there is diffuse airspace disease. Along the anterior margin of this consolidation there is a pulmonary nodule measuring 20 mm by 15 mm (image 77/2) which appears to correspond to a 12 mm x 9 mm nodule on comparison FDG PET scan. Several more peripheral RIGHT lobe pulmonary nodules are unchanged. For example 7 mm nodule on image 99/3 compared to 6 mm.  Peribronchial nodules measuring 12 mm (image 89/3) compared of 15 mm. RIGHT lower lobe nodule measuring 4 mm (image 98/3) is difficult define on prior CT due to airspace disease. Ovoid nodule over the diaphragm measuring 7 mm unchanged. Upper Abdomen: Limited view of the liver, kidneys, pancreas are unremarkable. Normal adrenal glands. Musculoskeletal: No aggressive osseous lesion. IMPRESSION: 1. Interval increase in size dominant central RIGHT lower lobe pulmonary nodule concerning for lung cancer recurrence . 2. Adjacent to the dominant nodule there is organized consolidation with air bronchograms typical of radiation change. 3. Additional pulmonary nodules in the RIGHT lower lobe are stable. These results will be called to the ordering clinician or representative by the Radiologist Assistant, and communication documented in the PACS or [employer] Electronically Signed   By: SSuzy BouchardM.D.   On: 03/20/2022 14:47    ONCOLOGY HISTORY:  Biopsy confirmed rectal cancer.  [facility] extension of the tumor beyond the wall into the rectovaginal recess with suspected invasion of the vaginal wall posteriorly.  Tumor also appears to involve the internal anal sphincter.  There are also numerous prominent presacral and mesorectal lymph nodes highly suspicious for malignancy.  PET scan results from November 17, 2020 reviewed independently with 3 hypermetabolic right lower lobe lung nodules consistent with pulmonary metastasis.  Patient completed cycle 8 of FOLFOX on Mar 29, 2021.  She then completed cycle 5 of her 5-FU pump on May 26, 2021 and XRT on June 01, 2021.  PET scan results from July 17, 2021 reviewed independently with no evidence of disease other than enlarging right lower lobe lung lesion.  Patient completed SBRT to the right lung lesion on August 22, 2021.  Repeat PET scan on July 25, 2021 revealed minimal residual disease in the rectum  and likely resolution of lesions in the lung.    ASSESSMENT: Stage IV rectal cancer, history of follicular lymphoma. PLAN:    1.  Stage IV rectal cancer: Given the results of the PET scan from September 2022, [facility].  Patient reports the plan was to undergo surgery, but when a new lung nodule appeared on CT scan ultimately this was declined.  PET scan results from February 9  revealed concern for progression of disease with a new small pulmonary nodule.  Patient chose active surveillance at that time.  Repeat imaging on Mar 20, 2022 reviewed independently and reported as above with continued progression of right lower lobe pulmonary nodule.  While this is likely a metastatic lesion, primary lung malignancy cannot be ruled out.  No biopsy has been performed.  Will repeat PET scan and refer patient to radiation oncology for definitive treatment of growing nodule.  Return to clinic in 4 months with repeat imaging and further evaluation.    2. Recurrent follicular lymphoma: CT scan results from December 23, 2019 reviewed independently with no obvious evidence of recurrent or progressive disease.  PET scan results as above consistent with rectal cancer metastasis and no evidence of lymphoma.   Patient received Zevalin on July 30, 2017 with not much therapeutic effect.  She subsequently underwent cycles 5 of R-CHOP chemotherapy with Neulasta support completing on December 12, 2017.  Patient continues to be in complete remission.  3.  Neuropathy: Chronic and unchanged. 4.  Weakness and fatigue: Patient does not complain of this today.  I spent a total of 20 minutes reviewing chart data, face-to-face evaluation with the patient, counseling and coordination of care as detailed above.   Patient expressed understanding and was in agreement with this plan. She also understands that She can call clinic at any time with any questions, concerns, or  complaints.    Cancer Staging  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 [facility]) Staging form: Lymphoid Neoplasms, AJCC 6th Edition - Clinical stage from 08/27/2016: Stage IIE - Signed by Lloyd Huger, MD on 08/27/2016  Rectal cancer [facility]) Staging form: Colon and Rectum, AJCC 8th Edition - Clinical: Stage IVA Larwance Sachs, Noelle.Marry) - Signed by Lloyd Huger, MD on 11/02/2020   Lloyd Huger, MD   03/24/2022 7:40 AM

## 2022-03-19 ENCOUNTER — Ambulatory Visit
Admission: RE | Admit: 2022-03-19 | Discharge: 2022-03-19 | Disposition: A | Discharge status: Home / Self Care | Payer: 59 | Source: Ambulatory Visit | Attending: Oncology | Admitting: Oncology

## 2022-03-19 ENCOUNTER — Inpatient Hospital Stay: Payer: 59 | Attending: Oncology

## 2022-03-19 DIAGNOSIS — C7801 Secondary malignant neoplasm of right lung: Secondary | ICD-10-CM | POA: Insufficient documentation

## 2022-03-19 DIAGNOSIS — C20 Malignant neoplasm of rectum: Secondary | ICD-10-CM | POA: Insufficient documentation

## 2022-03-19 DIAGNOSIS — C8208 Follicular lymphoma grade I, lymph nodes of multiple sites: Secondary | ICD-10-CM | POA: Diagnosis not present

## 2022-03-19 DIAGNOSIS — Z79899 Other long term (current) drug therapy: Secondary | ICD-10-CM | POA: Diagnosis not present

## 2022-03-19 DIAGNOSIS — G629 Polyneuropathy, unspecified: Secondary | ICD-10-CM | POA: Diagnosis not present

## 2022-03-19 LAB — CBC WITH DIFFERENTIAL/PLATELET
Abs Immature Granulocytes: 0.01 10*3/uL (ref 0.00–0.07)
Basophils Absolute: 0 10*3/uL (ref 0.0–0.1)
Basophils Relative: 1 %
Eosinophils Absolute: 0.1 10*3/uL (ref 0.0–0.5)
Eosinophils Relative: 2 %
HCT: 41.1 % (ref 36.0–46.0)
Hemoglobin: 13.2 g/dL (ref 12.0–15.0)
Immature Granulocytes: 0 %
Lymphocytes Relative: 28 %
Lymphs Abs: 1.5 10*3/uL (ref 0.7–4.0)
MCH: 30.4 pg (ref 26.0–34.0)
MCHC: 32.1 g/dL (ref 30.0–36.0)
MCV: 94.7 fL (ref 80.0–100.0)
Monocytes Absolute: 0.7 10*3/uL (ref 0.1–1.0)
Monocytes Relative: 14 %
Neutro Abs: 2.9 10*3/uL (ref 1.7–7.7)
Neutrophils Relative %: 55 %
Platelets: 209 10*3/uL (ref 150–400)
RBC: 4.34 MIL/uL (ref 3.87–5.11)
RDW: 13.5 % (ref 11.5–15.5)
WBC: 5.2 10*3/uL (ref 4.0–10.5)
nRBC: 0 % (ref 0.0–0.2)

## 2022-03-19 LAB — COMPREHENSIVE METABOLIC PANEL
ALT: 13 U/L (ref 0–44)
AST: 21 U/L (ref 15–41)
Albumin: 4 g/dL (ref 3.5–5.0)
Alkaline Phosphatase: 65 U/L (ref 38–126)
Anion gap: 8 (ref 5–15)
BUN: 12 mg/dL (ref 8–23)
CO2: 27 mmol/L (ref 22–32)
Calcium: 9.1 mg/dL (ref 8.9–10.3)
Chloride: 103 mmol/L (ref 98–111)
Creatinine, Ser: 0.61 mg/dL (ref 0.44–1.00)
GFR, Estimated: >60 mL/min (ref >60)
Glucose, Bld: 96 mg/dL (ref 70–99)
Potassium: 4 mmol/L (ref 3.5–5.1)
Sodium: 138 mmol/L (ref 135–145)
Total Bilirubin: 0.7 mg/dL (ref 0.3–1.2)
Total Protein: 7.2 g/dL (ref 6.5–8.1)

## 2022-03-19 MED ORDER — IOHEXOL 300 MG/ML  SOLN
60.0000 mL | Freq: Once | INTRAMUSCULAR | Status: AC | PRN
  Administered 2022-03-19: 60 mL via INTRAVENOUS

## 2022-03-20 ENCOUNTER — Telehealth: Payer: Self-pay | Admitting: *Deleted

## 2022-03-20 LAB — CEA: CEA: 6.2 ng/mL — ABNORMAL HIGH (ref 0.0–4.7)

## 2022-03-20 NOTE — Telephone Encounter (Signed)
Called report ? ?IMPRESSION: ?1. Interval increase in size dominant central RIGHT lower lobe ?pulmonary nodule concerning for lung cancer recurrence . ?2. Adjacent to the dominant nodule there is organized consolidation ?with air bronchograms typical of radiation change. ?3. Additional pulmonary nodules in the RIGHT lower lobe are stable. ?  ?These results will be called to the ordering clinician or ?representative by the Radiologist Assistant, and communication ?documented in the PACS or [employer]. ?  ?  ?Electronically Signed ?  By: Suzy Bouchard M.D. ?  On: 03/20/2022 14:47 ?

## 2022-03-22 ENCOUNTER — Encounter: Payer: Self-pay | Admitting: Oncology

## 2022-03-22 ENCOUNTER — Inpatient Hospital Stay (HOSPITAL_BASED_OUTPATIENT_CLINIC_OR_DEPARTMENT_OTHER): Payer: 59 | Admitting: Oncology

## 2022-03-22 VITALS — BP 124/83 | HR 90 | Temp 98.1°F | Resp 16 | Wt 102.7 lb

## 2022-03-22 DIAGNOSIS — C8208 Follicular lymphoma grade I, lymph nodes of multiple sites: Secondary | ICD-10-CM

## 2022-03-22 DIAGNOSIS — C20 Malignant neoplasm of rectum: Secondary | ICD-10-CM | POA: Diagnosis not present

## 2022-03-22 MED ORDER — GABAPENTIN 300 MG PO CAPS: 300.0000 mg | ORAL_CAPSULE | Freq: Two times a day (BID) | ORAL | 2 refills | Status: DC

## 2022-03-22 NOTE — Progress Notes (Signed)
Pt. Still has diarrhea several times a day. She does have tingling of hands and feet. I asked her about the med gabapentin and she does not think she has it but would like to have to help with tingling and cold on her hands and feet. She lost 2 pounds from last time she came per pt. She says she eats good. Some days she does not feel as good as every day

## 2022-03-24 ENCOUNTER — Encounter: Payer: Self-pay | Admitting: Oncology

## 2022-03-31 ENCOUNTER — Other Ambulatory Visit: Payer: Self-pay | Admitting: Oncology

## 2022-04-03 ENCOUNTER — Encounter: Payer: Self-pay | Admitting: Oncology

## 2022-04-04 ENCOUNTER — Encounter
Admission: RE | Admit: 2022-04-04 | Discharge: 2022-04-04 | Disposition: A | Discharge status: Home / Self Care | Payer: 59 | Source: Ambulatory Visit | Attending: Oncology | Admitting: Oncology

## 2022-04-04 DIAGNOSIS — C8208 Follicular lymphoma grade I, lymph nodes of multiple sites: Secondary | ICD-10-CM | POA: Insufficient documentation

## 2022-04-04 DIAGNOSIS — C20 Malignant neoplasm of rectum: Secondary | ICD-10-CM | POA: Insufficient documentation

## 2022-04-04 MED ORDER — FLUDEOXYGLUCOSE F - 18 (FDG) INJECTION
5.3000 | Freq: Once | INTRAVENOUS | Status: AC | PRN
  Administered 2022-04-04: 5.7 via INTRAVENOUS

## 2022-04-05 LAB — GLUCOSE, CAPILLARY: Glucose-Capillary: 81 mg/dL (ref 70–99)

## 2022-04-06 ENCOUNTER — Ambulatory Visit: Payer: Medicare Other | Admitting: Radiation Oncology

## 2022-04-09 ENCOUNTER — Inpatient Hospital Stay: Payer: 59 | Attending: Oncology

## 2022-04-09 DIAGNOSIS — C7801 Secondary malignant neoplasm of right lung: Secondary | ICD-10-CM | POA: Insufficient documentation

## 2022-04-09 DIAGNOSIS — Z95828 Presence of other vascular implants and grafts: Secondary | ICD-10-CM

## 2022-04-09 DIAGNOSIS — C20 Malignant neoplasm of rectum: Secondary | ICD-10-CM | POA: Insufficient documentation

## 2022-04-09 DIAGNOSIS — Z452 Encounter for adjustment and management of vascular access device: Secondary | ICD-10-CM | POA: Diagnosis present

## 2022-04-09 MED ORDER — SODIUM CHLORIDE 0.9% FLUSH
10.0000 mL | INTRAVENOUS | Status: DC | PRN
  Administered 2022-04-09: 10 mL via INTRAVENOUS
  Filled 2022-04-09: qty 10

## 2022-04-09 MED ORDER — HEPARIN SOD (PORK) LOCK FLUSH 100 UNIT/ML IV SOLN
500.0000 [IU] | Freq: Once | INTRAVENOUS | Status: AC
  Administered 2022-04-09: 500 [IU] via INTRAVENOUS
  Filled 2022-04-09: qty 5

## 2022-04-11 ENCOUNTER — Ambulatory Visit
Admission: RE | Admit: 2022-04-11 | Discharge: 2022-04-11 | Disposition: A | Discharge status: Home / Self Care | Payer: 59 | Source: Ambulatory Visit | Attending: Radiation Oncology | Admitting: Radiation Oncology

## 2022-04-11 ENCOUNTER — Encounter: Payer: Self-pay | Admitting: Radiation Oncology

## 2022-04-11 VITALS — BP 114/73 | HR 75 | Temp 98.0°F | Resp 16 | Wt 101.7 lb

## 2022-04-11 DIAGNOSIS — C20 Malignant neoplasm of rectum: Secondary | ICD-10-CM | POA: Insufficient documentation

## 2022-04-11 DIAGNOSIS — R918 Other nonspecific abnormal finding of lung field: Secondary | ICD-10-CM | POA: Diagnosis present

## 2022-04-11 DIAGNOSIS — Z923 Personal history of irradiation: Secondary | ICD-10-CM | POA: Insufficient documentation

## 2022-04-11 NOTE — Progress Notes (Signed)
Radiation Oncology Follow up Note old patient new area progressive disease in right lung   Name: Veronica Boyd   Date:   04/11/2022 MRN:  [id number] DOB: February 23, 1946    This 76 y.o. female presents to the clinic today for reevaluation of progressive disease in the right lung and patient receiving SBRT back in December 2022.  Patient also has a history of concurrent chemoradiation for local advanced rectal cancer.  REFERRING PROVIDER: Tracie Harrier, MD  HPI: Patient is a 76 year old female well-known to our department.  She had previous been treated with FOLFOX chemotherapy plus concurrent chemoradiation for locally advanced rectal cancer..  Rectal cancer was stage IV she does also have a history of follicular lymphoma.  We also previously treated with SBRT a lesion of the right lower lobe.  Recent studies have shown excellent response in the area of SBRT.  Tracking back towards the right hilum there are 2 areas of hypermetabolic activity consistent with progressive disease which have progressed over time.  I been asked to evaluate those for possible SBRT.  She has a mild cough no hemoptysis or chest tightness.  Her bowel function is good at this time.  I have discussed bronchoscopy although patient has declined.  COMPLICATIONS OF TREATMENT: none  FOLLOW UP COMPLIANCE: keeps appointments   PHYSICAL EXAM:  BP 114/73 (BP Location: Right Arm, Patient Position: Sitting, Cuff Size: Small)   Pulse 75   Temp 98 F (36.7 C) (Tympanic)   Resp 16   Wt 101 lb 11.2 oz (46.1 kg)   BMI 21.26 kg/m  Well-developed well-nourished patient in NAD. HEENT reveals PERLA, EOMI, discs not visualized.  Oral cavity is clear. No oral mucosal lesions are identified. Neck is clear without evidence of cervical or supraclavicular adenopathy. Lungs are clear to A&P. Cardiac examination is essentially unremarkable with regular rate and rhythm without murmur rub or thrill. Abdomen is benign with no organomegaly or masses  noted. Motor sensory and DTR levels are equal and symmetric in the upper and lower extremities. Cranial nerves II through XII are grossly intact. Proprioception is intact. No peripheral adenopathy or edema is identified. No motor or sensory levels are noted. Crude visual fields are within normal range.  RADIOLOGY RESULTS: Serial CT scans and PET CT scans reviewed compatible with above-stated findings  PLAN: At this time I have offered SBRT to the 2 new areas of progressive hypermetabolic activity in the right lower lobe.  Would treat up to [place] in 5 fractions based on close proximity to previous SBRT field.  Risks and benefits of treatment including possible development of cough fatigue further fibrosis of normal lung volume all were discussed in detail.  Patient comprehends my recommendations well.  I have personally ordered CT simulation with 4-dimensional treatment planning and PET fusion study with motion restriction.  I would like to take this opportunity to thank you for allowing me to participate in the care of your patient.[name], MD

## 2022-04-16 ENCOUNTER — Ambulatory Visit
Admission: RE | Admit: 2022-04-16 | Discharge: 2022-04-16 | Disposition: A | Discharge status: Home / Self Care | Payer: 59 | Source: Ambulatory Visit | Attending: Radiation Oncology | Admitting: Radiation Oncology

## 2022-04-16 DIAGNOSIS — C20 Malignant neoplasm of rectum: Secondary | ICD-10-CM | POA: Diagnosis present

## 2022-04-16 DIAGNOSIS — C7801 Secondary malignant neoplasm of right lung: Secondary | ICD-10-CM | POA: Diagnosis present

## 2022-04-16 DIAGNOSIS — Z510 Encounter for antineoplastic radiation therapy: Secondary | ICD-10-CM | POA: Diagnosis not present

## 2022-04-23 DIAGNOSIS — Z510 Encounter for antineoplastic radiation therapy: Secondary | ICD-10-CM | POA: Diagnosis not present

## 2022-04-25 ENCOUNTER — Ambulatory Visit
Admission: RE | Admit: 2022-04-25 | Discharge: 2022-04-25 | Disposition: A | Discharge status: Home / Self Care | Payer: 59 | Source: Ambulatory Visit | Attending: Radiation Oncology | Admitting: Radiation Oncology

## 2022-04-25 ENCOUNTER — Other Ambulatory Visit: Payer: Self-pay

## 2022-04-25 DIAGNOSIS — Z510 Encounter for antineoplastic radiation therapy: Secondary | ICD-10-CM | POA: Diagnosis not present

## 2022-04-25 LAB — RAD ONC ARIA SESSION SUMMARY
Course Elapsed Days: 0
Plan Fractions Treated to Date: 1
Plan Prescribed Dose Per Fraction: 10 Gy
Plan Total Fractions Prescribed: 5
Plan Total Prescribed Dose: 50 Gy
Reference Point Dosage Given to Date: 10 Gy
Reference Point Session Dosage Given: 10 Gy
Session Number: 1

## 2022-04-30 ENCOUNTER — Other Ambulatory Visit: Payer: Self-pay

## 2022-04-30 ENCOUNTER — Ambulatory Visit
Admission: RE | Admit: 2022-04-30 | Discharge: 2022-04-30 | Disposition: A | Discharge status: Home / Self Care | Payer: 59 | Source: Ambulatory Visit | Attending: Radiation Oncology | Admitting: Radiation Oncology

## 2022-04-30 DIAGNOSIS — Z510 Encounter for antineoplastic radiation therapy: Secondary | ICD-10-CM | POA: Diagnosis not present

## 2022-04-30 LAB — RAD ONC ARIA SESSION SUMMARY
Course Elapsed Days: 5
Plan Fractions Treated to Date: 2
Plan Prescribed Dose Per Fraction: 10 Gy
Plan Total Fractions Prescribed: 5
Plan Total Prescribed Dose: 50 Gy
Reference Point Dosage Given to Date: 20 Gy
Reference Point Session Dosage Given: 10 Gy
Session Number: 2

## 2022-05-02 ENCOUNTER — Other Ambulatory Visit: Payer: Self-pay

## 2022-05-02 ENCOUNTER — Ambulatory Visit
Admission: RE | Admit: 2022-05-02 | Discharge: 2022-05-02 | Disposition: A | Discharge status: Home / Self Care | Payer: 59 | Source: Ambulatory Visit | Attending: Radiation Oncology | Admitting: Radiation Oncology

## 2022-05-02 DIAGNOSIS — Z510 Encounter for antineoplastic radiation therapy: Secondary | ICD-10-CM | POA: Diagnosis not present

## 2022-05-02 LAB — RAD ONC ARIA SESSION SUMMARY
Course Elapsed Days: 7
Plan Fractions Treated to Date: 3
Plan Prescribed Dose Per Fraction: 10 Gy
Plan Total Fractions Prescribed: 5
Plan Total Prescribed Dose: 50 Gy
Reference Point Dosage Given to Date: 30 Gy
Reference Point Session Dosage Given: 10 Gy
Session Number: 3

## 2022-05-07 ENCOUNTER — Other Ambulatory Visit: Payer: Self-pay

## 2022-05-07 ENCOUNTER — Ambulatory Visit
Admission: RE | Admit: 2022-05-07 | Discharge: 2022-05-07 | Disposition: A | Discharge status: Home / Self Care | Payer: 59 | Source: Ambulatory Visit | Attending: Radiation Oncology | Admitting: Radiation Oncology

## 2022-05-07 DIAGNOSIS — Z510 Encounter for antineoplastic radiation therapy: Secondary | ICD-10-CM | POA: Diagnosis not present

## 2022-05-07 DIAGNOSIS — C20 Malignant neoplasm of rectum: Secondary | ICD-10-CM | POA: Insufficient documentation

## 2022-05-07 DIAGNOSIS — C7801 Secondary malignant neoplasm of right lung: Secondary | ICD-10-CM | POA: Diagnosis present

## 2022-05-07 LAB — RAD ONC ARIA SESSION SUMMARY
Course Elapsed Days: 12
Plan Fractions Treated to Date: 4
Plan Prescribed Dose Per Fraction: 10 Gy
Plan Total Fractions Prescribed: 5
Plan Total Prescribed Dose: 50 Gy
Reference Point Dosage Given to Date: 40 Gy
Reference Point Session Dosage Given: 10 Gy
Session Number: 4

## 2022-05-09 ENCOUNTER — Other Ambulatory Visit: Payer: Self-pay

## 2022-05-09 ENCOUNTER — Ambulatory Visit: Payer: Medicare Other | Admitting: Radiation Oncology

## 2022-05-09 ENCOUNTER — Ambulatory Visit
Admission: RE | Admit: 2022-05-09 | Discharge: 2022-05-09 | Disposition: A | Discharge status: Home / Self Care | Payer: 59 | Source: Ambulatory Visit | Attending: Radiation Oncology | Admitting: Radiation Oncology

## 2022-05-09 DIAGNOSIS — Z510 Encounter for antineoplastic radiation therapy: Secondary | ICD-10-CM | POA: Diagnosis not present

## 2022-05-09 LAB — RAD ONC ARIA SESSION SUMMARY
Course Elapsed Days: 14
Plan Fractions Treated to Date: 5
Plan Prescribed Dose Per Fraction: 10 Gy
Plan Total Fractions Prescribed: 5
Plan Total Prescribed Dose: 50 Gy
Reference Point Dosage Given to Date: 50 Gy
Reference Point Session Dosage Given: 10 Gy
Session Number: 5

## 2022-05-16 ENCOUNTER — Other Ambulatory Visit: Payer: Self-pay | Admitting: Oncology

## 2022-05-25 ENCOUNTER — Telehealth: Payer: Self-pay | Admitting: *Deleted

## 2022-05-25 ENCOUNTER — Encounter: Payer: Self-pay | Admitting: Hospice and Palliative Medicine

## 2022-05-25 ENCOUNTER — Other Ambulatory Visit: Payer: Self-pay

## 2022-05-25 ENCOUNTER — Inpatient Hospital Stay: Payer: 59 | Attending: Oncology

## 2022-05-25 ENCOUNTER — Inpatient Hospital Stay: Payer: 59

## 2022-05-25 ENCOUNTER — Inpatient Hospital Stay (HOSPITAL_BASED_OUTPATIENT_CLINIC_OR_DEPARTMENT_OTHER): Payer: 59 | Admitting: Hospice and Palliative Medicine

## 2022-05-25 VITALS — BP 133/78 | HR 96 | Temp 96.6°F | Resp 20 | Wt 96.2 lb

## 2022-05-25 DIAGNOSIS — C20 Malignant neoplasm of rectum: Secondary | ICD-10-CM

## 2022-05-25 DIAGNOSIS — R112 Nausea with vomiting, unspecified: Secondary | ICD-10-CM

## 2022-05-25 DIAGNOSIS — R5383 Other fatigue: Secondary | ICD-10-CM | POA: Diagnosis not present

## 2022-05-25 DIAGNOSIS — C8208 Follicular lymphoma grade I, lymph nodes of multiple sites: Secondary | ICD-10-CM

## 2022-05-25 DIAGNOSIS — Z95828 Presence of other vascular implants and grafts: Secondary | ICD-10-CM

## 2022-05-25 DIAGNOSIS — C7801 Secondary malignant neoplasm of right lung: Secondary | ICD-10-CM | POA: Insufficient documentation

## 2022-05-25 DIAGNOSIS — R634 Abnormal weight loss: Secondary | ICD-10-CM | POA: Diagnosis not present

## 2022-05-25 LAB — CBC WITH DIFFERENTIAL/PLATELET
Abs Immature Granulocytes: 0.01 10*3/uL (ref 0.00–0.07)
Basophils Absolute: 0 10*3/uL (ref 0.0–0.1)
Basophils Relative: 1 %
Eosinophils Absolute: 0.1 10*3/uL (ref 0.0–0.5)
Eosinophils Relative: 1 %
HCT: 38.7 % (ref 36.0–46.0)
Hemoglobin: 12.7 g/dL (ref 12.0–15.0)
Immature Granulocytes: 0 %
Lymphocytes Relative: 15 %
Lymphs Abs: 0.9 10*3/uL (ref 0.7–4.0)
MCH: 30.6 pg (ref 26.0–34.0)
MCHC: 32.8 g/dL (ref 30.0–36.0)
MCV: 93.3 fL (ref 80.0–100.0)
Monocytes Absolute: 1 10*3/uL (ref 0.1–1.0)
Monocytes Relative: 17 %
Neutro Abs: 3.8 10*3/uL (ref 1.7–7.7)
Neutrophils Relative %: 66 %
Platelets: 194 10*3/uL (ref 150–400)
RBC: 4.15 MIL/uL (ref 3.87–5.11)
RDW: 14 % (ref 11.5–15.5)
WBC: 5.8 10*3/uL (ref 4.0–10.5)
nRBC: 0 % (ref 0.0–0.2)

## 2022-05-25 LAB — COMPREHENSIVE METABOLIC PANEL
ALT: 12 U/L (ref 0–44)
AST: 17 U/L (ref 15–41)
Albumin: 3.6 g/dL (ref 3.5–5.0)
Alkaline Phosphatase: 61 U/L (ref 38–126)
Anion gap: 6 (ref 5–15)
BUN: 13 mg/dL (ref 8–23)
CO2: 27 mmol/L (ref 22–32)
Calcium: 9 mg/dL (ref 8.9–10.3)
Chloride: 104 mmol/L (ref 98–111)
Creatinine, Ser: 0.72 mg/dL (ref 0.44–1.00)
GFR, Estimated: >60 mL/min (ref >60)
Glucose, Bld: 96 mg/dL (ref 70–99)
Potassium: 3.7 mmol/L (ref 3.5–5.1)
Sodium: 137 mmol/L (ref 135–145)
Total Bilirubin: 0.7 mg/dL (ref 0.3–1.2)
Total Protein: 6.8 g/dL (ref 6.5–8.1)

## 2022-05-25 LAB — MAGNESIUM: Magnesium: 2.3 mg/dL (ref 1.7–2.4)

## 2022-05-25 MED ORDER — HEPARIN SOD (PORK) LOCK FLUSH 100 UNIT/ML IV SOLN
500.0000 [IU] | Freq: Once | INTRAVENOUS | Status: AC
  Administered 2022-05-25: 500 [IU]
  Filled 2022-05-25: qty 5

## 2022-05-25 MED ORDER — SODIUM CHLORIDE 0.9 % IV SOLN
INTRAVENOUS | Status: DC
  Filled 2022-05-25 (×2): qty 250

## 2022-05-25 MED ORDER — ONDANSETRON HCL 8 MG PO TABS: 8.0000 mg | ORAL_TABLET | Freq: Three times a day (TID) | ORAL | 0 refills | Status: DC | PRN

## 2022-05-25 MED ORDER — SODIUM CHLORIDE 0.9% FLUSH
10.0000 mL | Freq: Once | INTRAVENOUS | Status: AC
  Administered 2022-05-25: 10 mL via INTRAVENOUS
  Filled 2022-05-25: qty 10

## 2022-05-25 NOTE — Telephone Encounter (Signed)
Contacted Robin. Pt added to sch for port lab at 1245- see smc/ possible iv fluids following this lab apt

## 2022-05-25 NOTE — Telephone Encounter (Signed)
Shirlean Mylar called reporting that patient is not feeling well for the past 3 weeks, She is weak, she is not wanting to eat, She has nausea and some vomiting. She had an episode where she felt she was going to pass out. She sees that there are changes that are difficult to put into words. She would like for patient to be evaluated before Sept when her next appointment is with r Finnegan. She completed radiation therapy the first of this month. Please advise

## 2022-05-25 NOTE — Progress Notes (Signed)
Symptom Management [place] at [facility] [phone] [phone]  Patient Care Team: Tracie Harrier, MD as PCP - General (Internal Medicine) Lloyd Huger, MD as Consulting Physician (Oncology) [name], MD as Referring Physician (Radiation Oncology) Jacquelin Hawking, NP as Nurse Practitioner (Oncology) Clent Jacks, RN as Oncology Nurse Navigator   Name of the patient: Veronica Boyd  [id number]  1945/12/29   Date of visit: 05/25/22  Reason for Consult: BEYZA BELLINO is a 76 y.o. female with multiple medical problems including stage IV rectal cancer, history of follicular lymphoma.  Patient is status post chemotherapy and RT.  Plan was for surgical evaluation but patient was found to have disease progression with pulmonary nodules.  Patient ultimately opted for surveillance.  Patient last saw Dr. Grayland Ormond on 03/22/2022 at which time repeat imaging on Mar 20, 2022 showed continued progression of right lower lobe pulmonary nodule.  Plan was for repeat imaging in 4 months.  Patient called and requested [facility] visit today, reportedly not feeling well over the past 3 weeks with intermittent nausea/vomiting.  Patient reports worsening fatigue over the past several weeks, poor appetite, and weight loss.  She has had intermittent nausea with one episode of vomiting a couple weeks ago.  She reports regular bowel movements and denies blood in stool.  No pain.  She attributes some of the symptoms to the heat.  Denies any neurologic complaints. Denies recent fevers or illnesses. Denies any easy bleeding or bruising. Denies chest pain. Denies any constipation, or diarrhea. Denies urinary complaints. Patient offers no further specific complaints today.    PAST MEDICAL HISTORY: Past Medical History:  Diagnosis Date   Arthritis    Cataract    bilateral   Chicken pox    Colon polyp    Follicular lymphoma ([place]) 08/2016    lymph nodes    Hyperlipidemia    Osteoporosis    Rectal carcinoma ([place])     PAST SURGICAL HISTORY:  Past Surgical History:  Procedure Laterality Date   AXILLARY LYMPH NODE DISSECTION Right 08/21/2016   Procedure: AXILLARY LYMPH NODE excision;  Surgeon: Leonie Green, MD;  Location: [facility] ORS;  Service: General;  Laterality: Right;   CATARACT EXTRACTION, BILATERAL Bilateral    COLONOSCOPY N/A 09/19/2020   Procedure: COLONOSCOPY;  Surgeon: Lesly Rubenstein, MD;  Location: [facility] ENDOSCOPY;  Service: Endoscopy;  Laterality: N/A;   EYE SURGERY     JOINT REPLACEMENT Left    PORTA CATH INSERTION N/A 09/16/2017   Procedure: PORTA CATH INSERTION;  Surgeon: Algernon Huxley, MD;  Location: [facility];  Service: Cardiovascular;  Laterality: N/A;   TONSILLECTOMY     TOTAL HIP ARTHROPLASTY Left 1992    HEMATOLOGY/ONCOLOGY HISTORY:  Oncology History Overview Note  Initial diagnosis 2014. S/p radiation 3 years ago.  Noted increase lymphadenopathy and biopsy was recommended.  Unfortunately she refused and was lost to follow-up. Return to clinic in September 2017 with increased swelling surrounding her right collarbone.  Had biopsy that was suggestive of follicular lymphoma but not definitive.  Had PET scan to complete the staging work-up.  PET scan showed hypermetabolic adenopathy in bilateral subpectoral and axillary regions, right internal mammary chain and right paratracheal and subclavicular regions.  There is no evidence of hypermetabolic lymphadenopathy within the abdomen or pelvis.  Received Zevalin on July 30, 2017 with not much therapeutic effect.  She subsequently underwent cycles 5 of R CHOP chemotherapy with Neulasta support completing on  December 12, 2017.  No further intervention or treatments are needed at this time.  PET scan results from February 19, 2018 reviewed independently and reported as above with no suspicious finding for active lymphoma.      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  MEDICATIONS:  Current Outpatient Medications  Medication Sig Dispense Refill   alendronate (FOSAMAX) 70 MG tablet TAKE 1 TABLET EVERY 7 DAYS TAKE WITH A FULL GLASS OF WATER. DO NOT LIE DOWN FOR THE NEXT 30 MIN.  3   diclofenac sodium (VOLTAREN) 1 % GEL APPLY 2 GRAMS TOPICALLY 2 (TWO) TIMES DAILY     diphenoxylate-atropine (LOMOTIL) 2.5-0.025 MG tablet TAKE 2 TABLETS BY MOUTH 4 (FOUR) TIMES DAILY AS NEEDED FOR DIARRHEA OR LOOSE STOOLS. 60 tablet 1   gabapentin (NEURONTIN) 300 MG capsule Take 1 capsule (300 mg total) by mouth 2 (two) times daily. 60 capsule 2   ibuprofen (ADVIL) 400 MG tablet Take 400 mg by mouth every 6 (six) hours as needed.     Multiple Vitamins-Minerals (CENTRUM SILVER PO) Take 1 tablet by mouth daily.     pantoprazole (PROTONIX) 20 MG tablet Take 1 tablet by mouth daily.     tiZANidine (ZANAFLEX) 2 MG tablet Take 1 tablet (2 mg total) by mouth at bedtime as needed. 30 tablet 1   traMADol-acetaminophen (ULTRACET) 37.5-325 MG tablet Take 1 tablet by mouth every 8 (eight) hours as needed.      No current facility-administered medications for this visit.   Facility-Administered Medications Ordered in Other Visits  Medication Dose Route Frequency Provider Last Rate Last Admin   heparin lock flush 100 unit/mL  500 Units Intravenous Once Grayland Ormond, Kathlene November, MD       heparin lock flush 100 unit/mL  500 Units Intracatheter PRN  Grayland Ormond, Kathlene November, MD       heparin lock flush 100 unit/mL  500 Units Intravenous Once Lloyd Huger, MD       heparin lock flush 100 unit/mL  500 Units Intravenous Once Lloyd Huger, MD       heparin lock flush 100 unit/mL  500 Units Intracatheter Once Fidencio Duddy, Kirt Boys, NP       sodium chloride flush (NS) 0.9 % injection 10 mL  10 mL Intravenous PRN Lloyd Huger, MD   10 mL at 12/04/17 0901   sodium chloride flush (NS) 0.9 % injection 10 mL  10 mL Intracatheter PRN Lloyd Huger, MD       sodium chloride flush (NS) 0.9 % injection 10 mL  10 mL Intravenous PRN Lloyd Huger, MD   10 mL at 04/24/21 0839   sodium chloride flush (NS) 0.9 % injection 10 mL  10 mL Intravenous Once Itzayanna Kaster, Kirt Boys, NP       yttrium-90 injection 18.5 millicurie  63.1 millicurie Intravenous Once [name], MD  VITAL SIGNS: BP 133/78   Pulse 96   Temp (!) 96.6 F (35.9 C)   Resp 20   Wt 96 lb 3.2 oz (43.6 kg)   SpO2 98%   BMI 20.11 kg/m  Filed Weights   05/25/22 1248  Weight: 96 lb 3.2 oz (43.6 kg)    Estimated body mass index is 20.11 kg/m as calculated from the following:   Height as of 04/25/21: '4\' 10"'$  (1.473 m).   Weight as of this encounter: 96 lb 3.2 oz (43.6 kg).  LABS: CBC:    Component Value Date/Time   WBC 5.2 03/19/2022 0933   HGB 13.2 03/19/2022 0933   HGB 13.7 09/28/2013 1538   HCT 41.1 03/19/2022 0933   HCT 41.0 09/28/2013 1538   PLT 209 03/19/2022 0933   PLT 229 09/28/2013 1538   MCV 94.7 03/19/2022 0933   MCV 93 09/28/2013 1538   NEUTROABS 2.9 03/19/2022 0933   NEUTROABS 5.3 09/28/2013 1538   LYMPHSABS 1.5 03/19/2022 0933   LYMPHSABS 1.8 09/28/2013 1538   MONOABS 0.7 03/19/2022 0933   MONOABS 0.9 09/28/2013 1538   EOSABS 0.1 03/19/2022 0933   EOSABS 0.1 09/28/2013 1538   BASOSABS 0.0 03/19/2022 0933   BASOSABS 0.0 09/28/2013 1538   Comprehensive Metabolic Panel:    Component Value Date/Time   NA 138 03/19/2022 0933   K  4.0 03/19/2022 0933   CL 103 03/19/2022 0933   CO2 27 03/19/2022 0933   BUN 12 03/19/2022 0933   CREATININE 0.61 03/19/2022 0933   GLUCOSE 96 03/19/2022 0933   CALCIUM 9.1 03/19/2022 0933   AST 21 03/19/2022 0933   ALT 13 03/19/2022 0933   ALKPHOS 65 03/19/2022 0933   BILITOT 0.7 03/19/2022 0933   PROT 7.2 03/19/2022 0933   ALBUMIN 4.0 03/19/2022 0933    RADIOGRAPHIC STUDIES: No results found.  PERFORMANCE STATUS (ECOG) : 1 - Symptomatic but completely ambulatory  Review of Systems Unless otherwise noted, a complete review of systems is negative.  Physical Exam General: NAD Cardiovascular: regular rate and rhythm Pulmonary: clear ant fields Abdomen: soft, nontender, + bowel sounds GU: no suprapubic tenderness Extremities: no edema, no joint deformities Skin: no rashes Neurological: Weakness but otherwise nonfocal  Assessment and Plan- Patient is a 76 y.o. female with multiple medical problems including stage IV rectal cancer, history of follicular lymphoma.  Patient is status post chemotherapy and RT.  Plan was for surgical evaluation but patient was found to have disease progression with pulmonary nodules.  Patient ultimately opted for surveillance.   Nausea/fatigue/weight loss - patient has not significantly symptomatic during clinic visit today.  Labs checked and all are WNL.  Exam benign. Will add  on CEA.  Proceed with IV fluids today.  Weight loss is concerning.  Will refer to nutrition.  Recommended addition of oral nutritional supplements.  Will speak with Dr. Grayland Ormond to see if he wants to move forward CT scans.  RTC TBD  Patient expressed understanding and was in agreement with this plan. She also understands that She can call clinic at any time with any questions, concerns, or complaints.   Thank you for allowing me to participate in the care of this very pleasant patient.   Time Total: 15 minutes  Visit consisted of counseling and education dealing with the  complex and emotionally intense issues of symptom management in the setting of serious illness.Greater than 50%  of this time was spent counseling and coordinating care related to the above assessment and  plan.  Signed by: Altha Harm, PhD, NP-C

## 2022-05-26 LAB — CEA: CEA: 5.6 ng/mL — ABNORMAL HIGH (ref 0.0–4.7)

## 2022-05-28 ENCOUNTER — Inpatient Hospital Stay: Payer: 59

## 2022-05-28 ENCOUNTER — Telehealth: Payer: Self-pay | Admitting: *Deleted

## 2022-05-28 NOTE — Telephone Encounter (Signed)
Left vm for pt's daughter to see how patient is feeling today and over the weekend.- s/p smc apt

## 2022-05-30 ENCOUNTER — Inpatient Hospital Stay: Payer: 59

## 2022-05-30 ENCOUNTER — Telehealth: Payer: Self-pay | Admitting: *Deleted

## 2022-05-30 ENCOUNTER — Other Ambulatory Visit: Payer: Self-pay | Admitting: *Deleted

## 2022-05-30 MED ORDER — BENZONATATE 100 MG PO CAPS: 100.0000 mg | ORAL_CAPSULE | Freq: Three times a day (TID) | ORAL | 0 refills | Status: DC | PRN

## 2022-05-30 NOTE — Progress Notes (Signed)
Nutrition Assessment:  Referral for weight loss, poor appetite  76 year old female with stage IV rectal cancer currently under surveillance.  Past medical history of follicular lymphoma.  Completed SBRT for progression of disease in right lung on 7/5.   Met with patient.  Reports that she is fatigued, no appetite and has cough. Reports cough that keeps her up at night. Says that she was eating gravy biscuit this am and started coughing and vomited. Says that she has only drank one ensure today and nothing else to eat.  Has bottle of water with her.  Says that she is nauseated and took nausea pill this am.  No changes in bowel function.    CT planned for 7/28   Medications: zofran, protonix  Labs: reviewed  Anthropometrics:   Weight 94 lb 8 oz today  96 lb 3.2 oz on 7/21 101 lb 11.2 oz on 6/7 102 lb on 5/18 102 lb 12.8 oz on 11/09/21  8% weight loss since 2 months, significant   NUTRITION DIAGNOSIS: Inadequate oral intake related to nausea, poor appetite, cough as evidenced by 8% weight loss in 2 months and decreased intake    INTERVENTION:  Spoke with Merrily Pew, NP regarding patient with cough. Cough medication prescribed and nursing to discuss with patient. Encouraged patient to be proactive with nausea medication and reviewed how to take nausea medication.  Patient verbalized understanding Discussed foods to choose with nausea. Handout provided Encouraged patient to "nibble/snack" q 2 hours.   Recommend increase oral nutrition supplement (350 calories +) to 2 per day.  Gave samples of ensure complete and coupons.   Await results of CT scan Patient may benefit from trial of appetite stimulant pending above.    MONITORING, EVALUATION, GOAL: weight trends, intake   NEXT VISIT: phone call Aug 9 (Wednesday)  Lupita Rosales B. Zenia Resides, [place], [place] Registered Dietitian [phone]

## 2022-05-30 NOTE — Telephone Encounter (Signed)
Spoke to patient within the clinic while she was here for her nutrition apt. Pt requested medication for cough. I spoke patient she reports increase nausea with her coughing spells. V/o from [place], NP to send in script for benzonatate 100 mg 1 cap three times a day. Pt instructed to continue taking her zofran as directed. Keep apt for imaging on Friday as planned and follow-up with Dr Grayland Ormond as planned. Pt thanked me for speaking to her.

## 2022-06-01 ENCOUNTER — Ambulatory Visit
Admission: RE | Admit: 2022-06-01 | Discharge: 2022-06-01 | Disposition: A | Discharge status: Home / Self Care | Payer: 59 | Source: Ambulatory Visit | Attending: Oncology | Admitting: Oncology

## 2022-06-01 DIAGNOSIS — C8208 Follicular lymphoma grade I, lymph nodes of multiple sites: Secondary | ICD-10-CM | POA: Insufficient documentation

## 2022-06-01 DIAGNOSIS — C20 Malignant neoplasm of rectum: Secondary | ICD-10-CM | POA: Diagnosis present

## 2022-06-01 MED ORDER — IOHEXOL 300 MG/ML  SOLN
100.0000 mL | Freq: Once | INTRAMUSCULAR | Status: AC | PRN
  Administered 2022-06-01: 75 mL via INTRAVENOUS

## 2022-06-01 NOTE — Progress Notes (Unsigned)
[place]  Telephone:(336) [phone] [phone]) [phone]  ID: Veronica Boyd OB: [birth year]  MR#: [id number]  STM#:[id number]  Patient Care Team: Tracie Harrier, MD as PCP - General (Internal Medicine) Lloyd Huger, MD as Consulting Physician (Oncology) [name], MD as Referring Physician (Radiation Oncology) Jacquelin Hawking, NP as Nurse Practitioner (Oncology) Clent Jacks, RN as Oncology Nurse Navigator  CHIEF COMPLAINT: Stage IV rectal cancer, history of follicular lymphoma.  INTERVAL HISTORY: Patient returns to clinic today for repeat laboratory work, further evaluation, and discussion of her imaging results.  She has increased weakness and fatigue and states she has a poor appetite, but has gained weight in the interim.  She continues to have a chronic peripheral neuropathy.  She has no other neurologic complaints.  She denies any recent fevers or illnesses.  She denies any chest pain, shortness of breath, cough, or hemoptysis. She denies any nausea, vomiting, constipation or diarrhea.  She has no urinary complaints.  Patient offers no further specific complaints today.  REVIEW OF SYSTEMS:   Review of Systems  Constitutional:  Positive for malaise/fatigue. Negative for fever and weight loss.  Respiratory: Negative.  Negative for cough, hemoptysis and shortness of breath.   Cardiovascular: Negative.  Negative for chest pain and leg swelling.  Gastrointestinal:  Negative for abdominal pain, blood in stool, diarrhea, melena, nausea and vomiting.  Genitourinary: Negative.  Negative for dysuria.  Musculoskeletal: Negative.  Negative for back pain, joint pain and neck pain.  Skin: Negative.  Negative for rash.  Neurological:  Positive for tingling, sensory change and weakness. Negative for focal weakness and headaches.  Psychiatric/Behavioral: Negative.  The patient is not nervous/anxious and does not have insomnia.     As per HPI. Otherwise, a  complete review of systems is negative.   PAST MEDICAL HISTORY: Past Medical History:  Diagnosis Date   Arthritis    Cataract    bilateral   Chicken pox    Colon polyp    Follicular lymphoma ([place]) 08/2016   lymph nodes    Hyperlipidemia    Osteoporosis    Rectal carcinoma ([place])     PAST SURGICAL HISTORY: Past Surgical History:  Procedure Laterality Date   AXILLARY LYMPH NODE DISSECTION Right 08/21/2016   Procedure: AXILLARY LYMPH NODE excision;  Surgeon: Leonie Green, MD;  Location: [facility] ORS;  Service: General;  Laterality: Right;   CATARACT EXTRACTION, BILATERAL Bilateral    COLONOSCOPY N/A 09/19/2020   Procedure: COLONOSCOPY;  Surgeon: Lesly Rubenstein, MD;  Location: [facility] ENDOSCOPY;  Service: Endoscopy;  Laterality: N/A;   EYE SURGERY     JOINT REPLACEMENT Left    PORTA CATH INSERTION N/A 09/16/2017   Procedure: PORTA CATH INSERTION;  Surgeon: Algernon Huxley, MD;  Location: [place] [facility];  Service: Cardiovascular;  Laterality: N/A;   TONSILLECTOMY     TOTAL HIP ARTHROPLASTY Left 1992    FAMILY HISTORY: Family History  Problem Relation Age of Onset   Diabetes Sister    Lung cancer Brother    Diabetes Brother    Basal cell carcinoma Daughter    Breast cancer Paternal Aunt     ADVANCED DIRECTIVES (Y/N):  N  HEALTH MAINTENANCE: Social History   Tobacco Use   Smoking status: Never   Smokeless tobacco: Never  Vaping Use   Vaping Use: Never used  Substance Use Topics   Alcohol use: No   Drug use: No     Colonoscopy:  PAP:  Bone density:  Lipid panel:  No Known Allergies  Current Outpatient Medications  Medication Sig Dispense Refill   diclofenac sodium (VOLTAREN) 1 % GEL APPLY 2 GRAMS TOPICALLY 2 (TWO) TIMES DAILY     diphenoxylate-atropine (LOMOTIL) 2.5-0.025 MG tablet TAKE 2 TABLETS BY MOUTH 4 (FOUR) TIMES DAILY AS NEEDED FOR DIARRHEA OR LOOSE STOOLS. 60 tablet 1   gabapentin (NEURONTIN) 300 MG capsule Take 1 capsule (300 mg total) by  mouth 2 (two) times daily. 60 capsule 2   ibuprofen (ADVIL) 400 MG tablet Take 400 mg by mouth every 6 (six) hours as needed.     Multiple Vitamins-Minerals (CENTRUM SILVER PO) Take 1 tablet by mouth daily.     ondansetron (ZOFRAN) 8 MG tablet Take 1 tablet (8 mg total) by mouth every 8 (eight) hours as needed for nausea or vomiting. 20 tablet 0   pantoprazole (PROTONIX) 20 MG tablet Take 1 tablet by mouth daily.     POTASSIUM CHLORIDE PO Take by mouth.     predniSONE (STERAPRED UNI-PAK 21 TAB) 10 MG (21) TBPK tablet Taper as directed 21 tablet 0   simvastatin (ZOCOR) 20 MG tablet Take by mouth.     tiZANidine (ZANAFLEX) 2 MG tablet Take 1 tablet (2 mg total) by mouth at bedtime as needed. 30 tablet 1   traMADol-acetaminophen (ULTRACET) 37.5-325 MG tablet Take 1 tablet by mouth every 8 (eight) hours as needed.     alendronate (FOSAMAX) 70 MG tablet TAKE 1 TABLET EVERY 7 DAYS TAKE WITH A FULL GLASS OF WATER. DO NOT LIE DOWN FOR THE NEXT 30 MIN. (Patient not taking: Reported on 06/05/2022)  3   benzonatate (TESSALON) 100 MG capsule Take 1 capsule (100 mg total) by mouth 3 (three) times daily as needed for cough. (Patient not taking: Reported on 06/05/2022) 30 capsule 0   No current facility-administered medications for this visit.   Facility-Administered Medications Ordered in Other Visits  Medication Dose Route Frequency Provider Last Rate Last Admin   heparin lock flush 100 unit/mL  500 Units Intravenous Once Grayland Ormond, Kathlene November, MD       heparin lock flush 100 unit/mL  500 Units Intracatheter PRN Grayland Ormond, Kathlene November, MD       heparin lock flush 100 unit/mL  500 Units Intravenous Once Lloyd Huger, MD       heparin lock flush 100 unit/mL  500 Units Intravenous Once Lloyd Huger, MD       sodium chloride flush (NS) 0.9 % injection 10 mL  10 mL Intravenous PRN Lloyd Huger, MD   10 mL at 12/04/17 0901   sodium chloride flush (NS) 0.9 % injection 10 mL  10 mL Intracatheter PRN  Lloyd Huger, MD       sodium chloride flush (NS) 0.9 % injection 10 mL  10 mL Intravenous PRN Lloyd Huger, MD   10 mL at 04/24/21 0839   yttrium-90 injection 09.4 millicurie  70.9 millicurie Intravenous Once Chrystal, Eulas Post, MD        OBJECTIVE: Vitals:   06/05/22 1449  BP: (!) 102/50  Pulse: 97  Resp: 16  Temp: 98.9 F (37.2 C)  SpO2: 98%      Body mass index is 20.44 kg/m.    ECOG FS:0 - Asymptomatic  General: Thin, no acute distress. Eyes: Pink conjunctiva, anicteric sclera. HEENT: Normocephalic, moist mucous membranes. Lungs: No audible wheezing or coughing. Heart: Regular rate and rhythm. Abdomen: Soft, nontender, no obvious distention. Musculoskeletal: No edema, cyanosis, or clubbing.  Neuro: Alert, answering all questions appropriately. Cranial nerves grossly intact. Skin: No rashes or petechiae noted. Psych: Normal affect.  LAB RESULTS:  Lab Results  Component Value Date   NA 139 06/05/2022   K 4.0 06/05/2022   CL 105 06/05/2022   CO2 27 06/05/2022   GLUCOSE 86 06/05/2022   BUN 14 06/05/2022   CREATININE 0.64 06/05/2022   CALCIUM 8.9 06/05/2022   PROT 6.7 06/05/2022   ALBUMIN 3.7 06/05/2022   AST 17 06/05/2022   ALT 11 06/05/2022   ALKPHOS 61 06/05/2022   BILITOT 0.4 06/05/2022   GFRNONAA >60 06/05/2022   GFRAA >60 06/28/2020    Lab Results  Component Value Date   WBC 5.9 06/05/2022   NEUTROABS 3.2 06/05/2022   HGB 12.3 06/05/2022   HCT 37.4 06/05/2022   MCV 94.0 06/05/2022   PLT 209 06/05/2022     STUDIES: CT CHEST ABDOMEN PELVIS W CONTRAST  Result Date: 06/01/2022 CLINICAL DATA:  A 76 year old female presents for evaluation of rectal cancer and history of lymphoma. More recent diagnosis of rectal cancer. * Tracking Code: BO * EXAM: CT CHEST, ABDOMEN, AND PELVIS WITH CONTRAST TECHNIQUE: Multidetector CT imaging of the chest, abdomen and pelvis was performed following the standard protocol during bolus administration of  intravenous contrast. RADIATION DOSE REDUCTION: This exam was performed according to the departmental dose-optimization program which includes automated exposure control, adjustment of the mA and/or kV according to patient size and/or use of iterative reconstruction technique. CONTRAST:  80m OMNIPAQUE IOHEXOL 300 MG/ML  SOLN COMPARISON:  Mar 19, 2022 FINDINGS: CT CHEST FINDINGS Cardiovascular: Calcified aortic atherosclerotic changes and noncalcified plaque in the thoracic aorta. No aneurysmal dilation. Normal heart size without pericardial effusion or thickening. RIGHT-sided Port-A-Cath terminating in the distal superior vena cava. Mediastinum/Nodes: Esophagus grossly normal. Thoracic inlet structures are normal. No axillary lymphadenopathy. No mediastinal lymphadenopathy. No hilar adenopathy. Lungs/Pleura: Basilar atelectasis. No effusion. No consolidative changes aside from bandlike area of consolidation compatible with post treatment change in the RIGHT lower lobe Nodular area central 2 post radiation changes in the RIGHT lower lobe at 15 x 13 mm within 1 mm of previous measurement obtained on the may of 2023 evaluation. RIGHT lower lobe nodule (image 99/3) 7 mm to 8 mm. Juxta diaphragmatic nodular area compatible with herniation of a small amount of fat along the posterior RIGHT hemidiaphragm is unchanged. Other small nodule in the RIGHT lower lobe (image 98/3 approximately 3-4 mm also unchanged. Pleural and parenchymal scarring in the RIGHT upper lobe peripherally and posteriorly similar to previous imaging. Airways are patent. LEFT chest is clear aside from mild lingular scarring. Musculoskeletal: See below for full musculoskeletal details. CT ABDOMEN PELVIS FINDINGS Hepatobiliary: Smooth hepatic contours. No focal, suspicious hepatic lesion. The gallbladder is decompressed. Biliary tree nondilated. Portal vein is patent. Pancreas: Normal, without mass, inflammation or ductal dilatation. Spleen: Normal.  Adrenals/Urinary Tract: Adrenal glands are unremarkable. Symmetric renal enhancement. No sign of hydronephrosis. No suspicious renal lesion or perinephric stranding. Urinary bladder is grossly unremarkable. Stomach/Bowel: No focal area of thickening about the colon. Mild diffuse rectal thickening is noted. Area largely obscured by streak artifact from LEFT hip arthroplasty. Grossly the areas not as thickened as on previous imaging. No signs of bowel obstruction. No acute gastric process or small bowel obstruction. Vascular/Lymphatic: Aortic atherosclerosis. No sign of aneurysm. Smooth contour of the IVC. There is no gastrohepatic or hepatoduodenal ligament lymphadenopathy. No retroperitoneal or mesenteric lymphadenopathy. No pelvic sidewall lymphadenopathy. Reproductive: Unremarkable to the extent  evaluated with streak artifact from LEFT hip arthroplasty. Other: No ascites.  No pneumoperitoneum. Musculoskeletal: No acute bone finding. No destructive bone process. Spinal degenerative changes. LEFT hip arthroplasty with streak artifact as described above. Pectus excavatum as before. IMPRESSION: 1. Mild diffuse rectal thickening is noted. Area largely obscured by streak artifact from LEFT hip arthroplasty. Grossly thickening may have improved slightly since previous imaging but is not well assessed. 2. No evidence of metastatic disease to the chest, abdomen or pelvis. 3. As compared to the prior CT imaging the dominant nodule in the RIGHT lower lobe does not appear substantially changed though perhaps slightly diminished in size compared to the previous PET of Apr 04, 2022. Other RIGHT lower lobe pulmonary nodules appear stable and remain concerning for metastatic disease. 4. No signs of new disease. 5. Aortic atherosclerosis. Aortic Atherosclerosis (ICD10-I70.0). Electronically Signed   By: Zetta Bills M.D.   On: 06/01/2022 15:25    ONCOLOGY HISTORY:  Biopsy confirmed rectal cancer.  MRI completed at [facility] on October 11, 2020 revealed extension of the tumor beyond the wall into the rectovaginal recess with suspected invasion of the vaginal wall posteriorly.  Tumor also appears to involve the internal anal sphincter.  There are also numerous prominent presacral and mesorectal lymph nodes highly suspicious for malignancy.  PET scan results from November 17, 2020 reviewed independently with 3 hypermetabolic right lower lobe lung nodules consistent with pulmonary metastasis.  Patient completed cycle 8 of FOLFOX on Mar 29, 2021.  She then completed cycle 5 of her 5-FU pump on May 26, 2021 and XRT on June 01, 2021.  PET scan results from July 17, 2021 reviewed independently with no evidence of disease other than enlarging right lower lobe lung lesion.  Patient completed SBRT to the right lung lesion on August 22, 2021.  Repeat PET scan on July 25, 2021 revealed minimal residual disease in the rectum and likely resolution of lesions in the lung.    ASSESSMENT: Stage IV rectal cancer, history of follicular lymphoma. PLAN:    1.  Stage IV rectal cancer: No evidence of disease.  Patient's CEA remains mildly elevated at 5.6.  CT of chest, abdomen, pelvis on June 01, 2022 reviewed independently with no obvious evidence of recurrent or progressive disease.  No intervention is needed at this time.  Return to clinic in 3 months for laboratory work only and then in 6 months for laboratory work, repeat imaging, and further evaluation.  2.  Pulmonary nodule: Given the results of the PET scan from September 2022, patient was referred back to [facility] for consideration of surgery.  Patient reports the plan was to undergo surgery, but when a new lung nodule appeared on CT scan ultimately this was declined.  PET scan results from February 9 revealed concern for progression of disease with a new small pulmonary nodule.  Patient chose active surveillance at that time.  Repeat imaging on Mar 20, 2022  revealed continued progression of right lower lobe pulmonary nodule.  While this is likely a metastatic lesion, primary lung malignancy cannot be ruled out.  No biopsy has been performed.  Patient referred to radiation oncology for definitive XRT.  Imaging as above.    3. Recurrent follicular lymphoma: CT scan results from December 23, 2019 reviewed independently with no obvious evidence of recurrent or progressive disease.  PET scan results as above consistent with rectal cancer metastasis and no evidence of lymphoma.   Patient received Zevalin on July 30, 2017 with not much therapeutic effect.  She subsequently underwent cycles 5 of R-CHOP chemotherapy with Neulasta support completing on December 12, 2017.  Patient continues to be in complete remission.  4.  Neuropathy: Chronic and unchanged. 5.  Weakness and fatigue: Chronic and unchanged.   Patient expressed understanding and was in agreement with this plan. She also understands that She can call clinic at any time with any questions, concerns, or complaints.    Cancer Staging  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 [facility]) Staging form: Lymphoid Neoplasms, AJCC 6th Edition - Clinical stage from 08/27/2016: Stage IIE - Signed by Lloyd Huger, MD on 08/27/2016  Rectal cancer [facility]) Staging form: Colon and Rectum, AJCC 8th Edition - Clinical: Stage IVA Larwance Sachs, Noelle.[facility]) - Signed by Lloyd Huger, MD on 11/02/2020   Lloyd Huger, MD   06/06/2022 4:40 PM

## 2022-06-05 ENCOUNTER — Encounter: Payer: Self-pay | Admitting: Oncology

## 2022-06-05 ENCOUNTER — Inpatient Hospital Stay: Payer: 59 | Attending: Hospice and Palliative Medicine

## 2022-06-05 ENCOUNTER — Inpatient Hospital Stay (HOSPITAL_BASED_OUTPATIENT_CLINIC_OR_DEPARTMENT_OTHER): Payer: 59 | Admitting: Oncology

## 2022-06-05 VITALS — BP 102/50 | HR 97 | Temp 98.9°F | Resp 16 | Ht <= 58 in | Wt 97.8 lb

## 2022-06-05 DIAGNOSIS — R5383 Other fatigue: Secondary | ICD-10-CM | POA: Diagnosis not present

## 2022-06-05 DIAGNOSIS — C20 Malignant neoplasm of rectum: Secondary | ICD-10-CM | POA: Diagnosis not present

## 2022-06-05 DIAGNOSIS — G629 Polyneuropathy, unspecified: Secondary | ICD-10-CM | POA: Insufficient documentation

## 2022-06-05 DIAGNOSIS — R531 Weakness: Secondary | ICD-10-CM | POA: Insufficient documentation

## 2022-06-05 DIAGNOSIS — C8290 Follicular lymphoma, unspecified, unspecified site: Secondary | ICD-10-CM | POA: Diagnosis not present

## 2022-06-05 DIAGNOSIS — Z79899 Other long term (current) drug therapy: Secondary | ICD-10-CM | POA: Diagnosis not present

## 2022-06-05 DIAGNOSIS — R911 Solitary pulmonary nodule: Secondary | ICD-10-CM | POA: Diagnosis not present

## 2022-06-05 DIAGNOSIS — C8208 Follicular lymphoma grade I, lymph nodes of multiple sites: Secondary | ICD-10-CM

## 2022-06-05 DIAGNOSIS — C7801 Secondary malignant neoplasm of right lung: Secondary | ICD-10-CM | POA: Diagnosis present

## 2022-06-05 LAB — CBC WITH DIFFERENTIAL/PLATELET
Abs Immature Granulocytes: 0.02 10*3/uL (ref 0.00–0.07)
Basophils Absolute: 0 10*3/uL (ref 0.0–0.1)
Basophils Relative: 1 %
Eosinophils Absolute: 0.1 10*3/uL (ref 0.0–0.5)
Eosinophils Relative: 2 %
HCT: 37.4 % (ref 36.0–46.0)
Hemoglobin: 12.3 g/dL (ref 12.0–15.0)
Immature Granulocytes: 0 %
Lymphocytes Relative: 27 %
Lymphs Abs: 1.6 10*3/uL (ref 0.7–4.0)
MCH: 30.9 pg (ref 26.0–34.0)
MCHC: 32.9 g/dL (ref 30.0–36.0)
MCV: 94 fL (ref 80.0–100.0)
Monocytes Absolute: 1 10*3/uL (ref 0.1–1.0)
Monocytes Relative: 17 %
Neutro Abs: 3.2 10*3/uL (ref 1.7–7.7)
Neutrophils Relative %: 53 %
Platelets: 209 10*3/uL (ref 150–400)
RBC: 3.98 MIL/uL (ref 3.87–5.11)
RDW: 14.5 % (ref 11.5–15.5)
WBC: 5.9 10*3/uL (ref 4.0–10.5)
nRBC: 0 % (ref 0.0–0.2)

## 2022-06-05 LAB — COMPREHENSIVE METABOLIC PANEL
ALT: 11 U/L (ref 0–44)
AST: 17 U/L (ref 15–41)
Albumin: 3.7 g/dL (ref 3.5–5.0)
Alkaline Phosphatase: 61 U/L (ref 38–126)
Anion gap: 7 (ref 5–15)
BUN: 14 mg/dL (ref 8–23)
CO2: 27 mmol/L (ref 22–32)
Calcium: 8.9 mg/dL (ref 8.9–10.3)
Chloride: 105 mmol/L (ref 98–111)
Creatinine, Ser: 0.64 mg/dL (ref 0.44–1.00)
GFR, Estimated: >60 mL/min (ref >60)
Glucose, Bld: 86 mg/dL (ref 70–99)
Potassium: 4 mmol/L (ref 3.5–5.1)
Sodium: 139 mmol/L (ref 135–145)
Total Bilirubin: 0.4 mg/dL (ref 0.3–1.2)
Total Protein: 6.7 g/dL (ref 6.5–8.1)

## 2022-06-05 MED ORDER — PREDNISONE 10 MG (21) PO TBPK: ORAL_TABLET | ORAL | 0 refills | Status: DC

## 2022-06-06 ENCOUNTER — Encounter: Payer: Self-pay | Admitting: Oncology

## 2022-06-11 ENCOUNTER — Ambulatory Visit
Admission: RE | Admit: 2022-06-11 | Discharge: 2022-06-11 | Disposition: A | Discharge status: Home / Self Care | Payer: 59 | Source: Ambulatory Visit | Attending: Radiation Oncology | Admitting: Radiation Oncology

## 2022-06-11 ENCOUNTER — Other Ambulatory Visit: Payer: Self-pay | Admitting: *Deleted

## 2022-06-11 VITALS — BP 115/61 | HR 108 | Temp 97.0°F | Resp 18 | Ht <= 58 in | Wt 96.4 lb

## 2022-06-11 DIAGNOSIS — R911 Solitary pulmonary nodule: Secondary | ICD-10-CM

## 2022-06-11 DIAGNOSIS — R918 Other nonspecific abnormal finding of lung field: Secondary | ICD-10-CM | POA: Insufficient documentation

## 2022-06-11 DIAGNOSIS — Z9221 Personal history of antineoplastic chemotherapy: Secondary | ICD-10-CM | POA: Diagnosis not present

## 2022-06-11 DIAGNOSIS — Z85048 Personal history of other malignant neoplasm of rectum, rectosigmoid junction, and anus: Secondary | ICD-10-CM | POA: Diagnosis not present

## 2022-06-11 DIAGNOSIS — Z923 Personal history of irradiation: Secondary | ICD-10-CM | POA: Insufficient documentation

## 2022-06-11 NOTE — Progress Notes (Signed)
Radiation Oncology Follow up Note  Name: Veronica Boyd   Date:   06/11/2022 MRN:  [id number] DOB: 1946-02-09    This 76 y.o. female presents to the clinic today for 1 month follow-up status post SBRT to her right lung.  And patient previously treated with chemoradiation for local advanced rectal cancer.  She is also received prior treatment to her right lower lobe  REFERRING PROVIDER: Tracie Harrier, MD  HPI: Patient is a 76 year old female now at 1 month of her completed SBRT to her right lung..  She did been treated previously to her right lower lobe as well as concurrent chemoradiation as well as FOLFOX chemotherapy for local advanced rectal cancer.  Seen today in follow-up she is doing fairly well.  She does have a cyst persistent slightly productive cough which we would expect.  She is having no dysphagia or pain at this time.  She had a recent CT scan last month showing mild diffuse rectal thickening as previously noted.  Right lower lobe mass appears slightly diminished as size compared to previous PET scan or other right lower lobe lesion was stable.  COMPLICATIONS OF TREATMENT: none  FOLLOW UP COMPLIANCE: keeps appointments   PHYSICAL EXAM:  BP 115/61   Pulse (!) 108   Temp (!) 97 F (36.1 C)   Resp 18   Ht '4\' 10"'$  (1.473 m)   Wt 96 lb 6.4 oz (43.7 kg)   BMI 20.15 kg/m  Well-developed well-nourished patient in NAD. HEENT reveals PERLA, EOMI, discs not visualized.  Oral cavity is clear. No oral mucosal lesions are identified. Neck is clear without evidence of cervical or supraclavicular adenopathy. Lungs are clear to A&P. Cardiac examination is essentially unremarkable with regular rate and rhythm without murmur rub or thrill. Abdomen is benign with no organomegaly or masses noted. Motor sensory and DTR levels are equal and symmetric in the upper and lower extremities. Cranial nerves II through XII are grossly intact. Proprioception is intact. No peripheral adenopathy or edema  is identified. No motor or sensory levels are noted. Crude visual fields are within normal range.  RADIOLOGY RESULTS: CT scans reviewed compatible with above-stated findings  PLAN: At the present time patient is clinically doing well no significant change in her pulmonary status.  I have asked to see her back in 3 months with a follow-up CT scan at that time.  She continues close follow-up care with medical oncology.  Patient is to call with any concerns.  I would like to take this opportunity to thank you for allowing me to participate in the care of your patient.[name], MD

## 2022-06-13 ENCOUNTER — Inpatient Hospital Stay: Payer: 59

## 2022-06-13 NOTE — Progress Notes (Signed)
Nutrition Follow-up:  Patient with stage IV rectal cancer currently under surveillance.  Completed SBRT for progression of disease in right lung on 7/5.  History of follicular lymphoma.  Spoke with patient via phone.  Patient reports that her appetite is a little bit better.  "I have gained 2 lbs."  Patient is drinking 350 calorie ensure 1-2 times per day.  Denies nausea.  She is still coughing but taking medication to help.  Likes to have 2 eggs, hashbrowns and toast each morning from [employer].  Ate spaghetti last night for dinner.      Medications: prednisone  Labs: reviewed  Anthropometrics:   96 lb 6.4 oz on 8/7  94 lb 8 oz on 7/26  96 lb on 7/21 101 lb on 6/7 102 lb on 5/18 102 lb on 11/09/21   NUTRITION DIAGNOSIS: Inadequate oral intake improving   INTERVENTION:  Encouraged patient to continue 350 calorie shake 1-2 times per day Encouraged high calorie, high protein foods    MONITORING, EVALUATION, GOAL: weight trends, intake   NEXT VISIT: Wednesday, Nov 1 before lab appointment  Tangee Marszalek B. Zenia Resides, [place], [place] Registered Dietitian [phone]

## 2022-07-10 ENCOUNTER — Other Ambulatory Visit: Payer: Self-pay | Admitting: Oncology

## 2022-07-23 ENCOUNTER — Other Ambulatory Visit: Payer: 59

## 2022-07-25 ENCOUNTER — Ambulatory Visit: Payer: 59 | Admitting: Oncology

## 2022-07-25 ENCOUNTER — Other Ambulatory Visit: Payer: 59

## 2022-07-31 ENCOUNTER — Inpatient Hospital Stay: Payer: 59 | Attending: Hospice and Palliative Medicine

## 2022-07-31 DIAGNOSIS — C20 Malignant neoplasm of rectum: Secondary | ICD-10-CM | POA: Insufficient documentation

## 2022-07-31 DIAGNOSIS — C7801 Secondary malignant neoplasm of right lung: Secondary | ICD-10-CM | POA: Insufficient documentation

## 2022-07-31 DIAGNOSIS — Z95828 Presence of other vascular implants and grafts: Secondary | ICD-10-CM

## 2022-07-31 DIAGNOSIS — Z452 Encounter for adjustment and management of vascular access device: Secondary | ICD-10-CM | POA: Insufficient documentation

## 2022-07-31 MED ORDER — SODIUM CHLORIDE 0.9% FLUSH
10.0000 mL | Freq: Once | INTRAVENOUS | Status: AC
  Administered 2022-07-31: 10 mL via INTRAVENOUS
  Filled 2022-07-31: qty 10

## 2022-07-31 MED ORDER — HEPARIN SOD (PORK) LOCK FLUSH 100 UNIT/ML IV SOLN
500.0000 [IU] | Freq: Once | INTRAVENOUS | Status: AC
  Administered 2022-07-31: 500 [IU] via INTRAVENOUS
  Filled 2022-07-31: qty 5

## 2022-08-10 ENCOUNTER — Telehealth: Payer: Self-pay

## 2022-08-10 NOTE — Telephone Encounter (Signed)
pt notified of appt change

## 2022-08-20 ENCOUNTER — Other Ambulatory Visit: Payer: Self-pay | Admitting: Oncology

## 2022-08-21 ENCOUNTER — Encounter: Payer: Self-pay | Admitting: Oncology

## 2022-09-01 ENCOUNTER — Other Ambulatory Visit: Payer: Self-pay | Admitting: Oncology

## 2022-09-05 ENCOUNTER — Inpatient Hospital Stay: Payer: 59 | Attending: Oncology

## 2022-09-05 DIAGNOSIS — R531 Weakness: Secondary | ICD-10-CM | POA: Diagnosis not present

## 2022-09-05 DIAGNOSIS — C8290 Follicular lymphoma, unspecified, unspecified site: Secondary | ICD-10-CM | POA: Diagnosis not present

## 2022-09-05 DIAGNOSIS — R911 Solitary pulmonary nodule: Secondary | ICD-10-CM | POA: Diagnosis not present

## 2022-09-05 DIAGNOSIS — C20 Malignant neoplasm of rectum: Secondary | ICD-10-CM | POA: Diagnosis not present

## 2022-09-05 DIAGNOSIS — R5383 Other fatigue: Secondary | ICD-10-CM | POA: Diagnosis not present

## 2022-09-05 DIAGNOSIS — G629 Polyneuropathy, unspecified: Secondary | ICD-10-CM | POA: Insufficient documentation

## 2022-09-05 DIAGNOSIS — Z79899 Other long term (current) drug therapy: Secondary | ICD-10-CM | POA: Insufficient documentation

## 2022-09-05 DIAGNOSIS — R918 Other nonspecific abnormal finding of lung field: Secondary | ICD-10-CM

## 2022-09-05 DIAGNOSIS — R053 Chronic cough: Secondary | ICD-10-CM | POA: Insufficient documentation

## 2022-09-05 HISTORY — DX: Other nonspecific abnormal finding of lung field: R91.8

## 2022-09-05 LAB — COMPREHENSIVE METABOLIC PANEL
ALT: 11 U/L (ref 0–44)
AST: 20 U/L (ref 15–41)
Albumin: 3.6 g/dL (ref 3.5–5.0)
Alkaline Phosphatase: 68 U/L (ref 38–126)
Anion gap: 6 (ref 5–15)
BUN: 12 mg/dL (ref 8–23)
CO2: 27 mmol/L (ref 22–32)
Calcium: 8.9 mg/dL (ref 8.9–10.3)
Chloride: 106 mmol/L (ref 98–111)
Creatinine, Ser: 0.71 mg/dL (ref 0.44–1.00)
GFR, Estimated: >60 mL/min (ref >60)
Glucose, Bld: 77 mg/dL (ref 70–99)
Potassium: 4 mmol/L (ref 3.5–5.1)
Sodium: 139 mmol/L (ref 135–145)
Total Bilirubin: 0.6 mg/dL (ref 0.3–1.2)
Total Protein: 6.8 g/dL (ref 6.5–8.1)

## 2022-09-05 LAB — CBC WITH DIFFERENTIAL/PLATELET
Abs Immature Granulocytes: 0.01 10*3/uL (ref 0.00–0.07)
Basophils Absolute: 0 10*3/uL (ref 0.0–0.1)
Basophils Relative: 1 %
Eosinophils Absolute: 0.2 10*3/uL (ref 0.0–0.5)
Eosinophils Relative: 2 %
HCT: 42.4 % (ref 36.0–46.0)
Hemoglobin: 13.5 g/dL (ref 12.0–15.0)
Immature Granulocytes: 0 %
Lymphocytes Relative: 23 %
Lymphs Abs: 1.6 10*3/uL (ref 0.7–4.0)
MCH: 30.9 pg (ref 26.0–34.0)
MCHC: 31.8 g/dL (ref 30.0–36.0)
MCV: 97 fL (ref 80.0–100.0)
Monocytes Absolute: 1 10*3/uL (ref 0.1–1.0)
Monocytes Relative: 14 %
Neutro Abs: 4.1 10*3/uL (ref 1.7–7.7)
Neutrophils Relative %: 60 %
Platelets: 201 10*3/uL (ref 150–400)
RBC: 4.37 MIL/uL (ref 3.87–5.11)
RDW: 13.7 % (ref 11.5–15.5)
WBC: 6.9 10*3/uL (ref 4.0–10.5)
nRBC: 0 % (ref 0.0–0.2)

## 2022-09-06 LAB — CEA: CEA: 4.6 ng/mL (ref 0.0–4.7)

## 2022-09-11 ENCOUNTER — Inpatient Hospital Stay: Payer: 59

## 2022-09-11 NOTE — Progress Notes (Signed)
Nutrition Follow-up:   Patient with stage IV rectal cancer currently under surveillance.  Completed SBRT for progression of disease of right lung on 7/5.  History of follicular lymphoma.    Called and spoke with patient via phone.  Patient reports that appetite is doing good.  She is eating mostly 3 meals a day.  Has not been drinking ensure shakes recently and needs to get her daughter to order her some more.  Denies nutrition impact symptoms at this time.     Medications: reviewed  Labs: reviewed  Anthropometrics:   No new weight since 8/7 of 96 lb 6.4 oz  94 lb 8 oz on 7/26 96 lb on 7/21 101 lb on 6/7 102 lb on 5/18   NUTRITION DIAGNOSIS: Inadequate oral intake improved    INTERVENTION:  Continue oral nutrition supplements or snacks between meals if able If weight declines can consider trial of appetite stimulant Encouraged high calorie/high protein foods to prevent weight loss Patient has contact information   NEXT VISIT: no follow-up RD available as needed  Cove Haydon B. Zenia Resides, [place], [place] Registered Dietitian [phone]

## 2022-09-12 ENCOUNTER — Other Ambulatory Visit: Payer: Self-pay | Admitting: Oncology

## 2022-09-12 ENCOUNTER — Ambulatory Visit
Admission: RE | Admit: 2022-09-12 | Discharge: 2022-09-12 | Disposition: A | Discharge status: Home / Self Care | Payer: 59 | Source: Ambulatory Visit | Attending: Radiation Oncology | Admitting: Radiation Oncology

## 2022-09-12 DIAGNOSIS — R911 Solitary pulmonary nodule: Secondary | ICD-10-CM | POA: Diagnosis present

## 2022-09-12 MED ORDER — IOHEXOL 300 MG/ML  SOLN
60.0000 mL | Freq: Once | INTRAMUSCULAR | Status: AC | PRN
  Administered 2022-09-12: 60 mL via INTRAVENOUS

## 2022-09-13 ENCOUNTER — Ambulatory Visit: Admission: RE | Admit: 2022-09-13 | Payer: 59 | Source: Ambulatory Visit

## 2022-09-17 ENCOUNTER — Ambulatory Visit
Admission: RE | Admit: 2022-09-17 | Discharge: 2022-09-17 | Disposition: A | Discharge status: Home / Self Care | Payer: 59 | Source: Ambulatory Visit | Attending: Radiation Oncology | Admitting: Radiation Oncology

## 2022-09-17 ENCOUNTER — Telehealth: Payer: Self-pay | Admitting: *Deleted

## 2022-09-17 ENCOUNTER — Other Ambulatory Visit: Payer: Self-pay | Admitting: *Deleted

## 2022-09-17 ENCOUNTER — Encounter: Payer: Self-pay | Admitting: Radiation Oncology

## 2022-09-17 VITALS — BP 145/82 | HR 73 | Temp 97.0°F | Resp 14 | Ht <= 58 in | Wt 91.2 lb

## 2022-09-17 DIAGNOSIS — R911 Solitary pulmonary nodule: Secondary | ICD-10-CM

## 2022-09-17 NOTE — Progress Notes (Signed)
Radiation Oncology Follow up Note  Name: Veronica Boyd   Date:   09/17/2022 MRN:  [id number] DOB: 08-23-1946    This 76 y.o. female presents to the clinic today for 12-monthfollow-up status post SBRT to her right lung receiving treatments to her right lower lobe as well as right midlung in patient with history of local advanced rectal cancer as well as follicular lymphoma.  REFERRING PROVIDER: HTracie Harrier MD  HPI: Patient is a 76year old female with complicated history of follicular lymphoma as well as locally advanced rectal cancer.  She has received SBRT x2 to her right lower lobe and right midlung more recently completed treatments to her right lung.  She is also previously had completed concurrent chemoradiation therapy as well as FOLFOX for local advanced rectal cancer.  Her most recent CT scan performed this month.  Showing at least 3 new right lower lobe nodules progressing consistent with either recurrent lung cancer or metastatic rectal cancer.  She continues to have a chronic slightly productive cough no hemoptysis.  COMPLICATIONS OF TREATMENT: none  FOLLOW UP COMPLIANCE: keeps appointments   PHYSICAL EXAM:  BP (!) 145/82   Pulse 73   Temp (!) 97 F (36.1 C)   Resp 14   Ht '4\' 10"'$  (1.473 m)   Wt 91 lb 3.2 oz (41.4 kg)   BMI 19.06 kg/m  Thin slightly cachectic female in NAD.  Well-developed well-nourished patient in NAD. HEENT reveals PERLA, EOMI, discs not visualized.  Oral cavity is clear. No oral mucosal lesions are identified. Neck is clear without evidence of cervical or supraclavicular adenopathy. Lungs are clear to A&P. Cardiac examination is essentially unremarkable with regular rate and rhythm without murmur rub or thrill. Abdomen is benign with no organomegaly or masses noted. Motor sensory and DTR levels are equal and symmetric in the upper and lower extremities. Cranial nerves II through XII are grossly intact. Proprioception is intact. No peripheral  adenopathy or edema is identified. No motor or sensory levels are noted. Crude visual fields are within normal range.  RADIOLOGY RESULTS: Serial CT scans reviewed compatible with above-stated findings  PLAN: This time I am referring the patient to Dr. GPatsey Bertholdfor possible attempted bronchoscopy and tissue biopsy.  Muscle referring patient back to Dr. FGrayland Ormond  I think we need to decide what exactly these nodules that are progressing are histologically.  I have asked to see her back in 1 month for follow-up hopefully her work-up will be complete by then.  Patient comprehends my recommendations well.  I would like to take this opportunity to thank you for allowing me to participate in the care of your patient..[name] MD

## 2022-09-17 NOTE — Telephone Encounter (Signed)
Returned patient's daughters call regarding new appointments added to her patient schedule. Per Dr. Donette Larry office note today there are at least 3 new nodules visible on patient's latest scan. He also referred patient to Dr. Grayland Ormond and Dr. Patsey Berthold for further work up. Daughter verbalized understanding.

## 2022-09-18 ENCOUNTER — Ambulatory Visit (INDEPENDENT_AMBULATORY_CARE_PROVIDER_SITE_OTHER): Payer: 59 | Admitting: Pulmonary Disease

## 2022-09-18 ENCOUNTER — Encounter: Payer: Self-pay | Admitting: Pulmonary Disease

## 2022-09-18 ENCOUNTER — Telehealth: Payer: Self-pay

## 2022-09-18 VITALS — BP 120/78 | HR 107 | Temp 98.3°F | Ht <= 58 in | Wt 90.6 lb

## 2022-09-18 DIAGNOSIS — C20 Malignant neoplasm of rectum: Secondary | ICD-10-CM

## 2022-09-18 DIAGNOSIS — C8208 Follicular lymphoma grade I, lymph nodes of multiple sites: Secondary | ICD-10-CM | POA: Diagnosis not present

## 2022-09-18 DIAGNOSIS — R918 Other nonspecific abnormal finding of lung field: Secondary | ICD-10-CM

## 2022-09-18 DIAGNOSIS — R053 Chronic cough: Secondary | ICD-10-CM | POA: Diagnosis not present

## 2022-09-18 DIAGNOSIS — R911 Solitary pulmonary nodule: Secondary | ICD-10-CM

## 2022-09-18 NOTE — Patient Instructions (Addendum)
We are going to schedule you for your procedure on 27 November.  This will be at 12:30 PM.  You will be scheduled for a special CT of the chest prior to that.  This will help [place] map out your airways to the spot in question.  For now you can use some Delsym over-the-counter to see if this helps with your cough.  We will see you in follow-up in 4 to 6 weeks time.

## 2022-09-18 NOTE — H&P (View-Only) (Signed)
Subjective:    Patient ID: Veronica Boyd, female    DOB: [birth year], 76 y.o.   MRN: [id number] Patient Care Team: Tracie Harrier, MD as PCP - General (Internal Medicine) Lloyd Huger, MD as Consulting Physician (Oncology) [name], MD as Referring Physician (Radiation Oncology) Jacquelin Hawking, NP as Nurse Practitioner (Oncology) Clent Jacks, RN as Oncology Nurse Navigator  Chief Complaint  Patient presents with   Consult    Lung nodule. CT on 09/12/2022. SOB with exertion.  Dry cough for 2-3 months.    HPI Veronica Boyd is a 76 year old lifelong never smoker who is kindly referred by Dr. Berton Mount for pulmonary nodules in the setting of stage IV rectal cancer and a history of follicular lymphoma.  Her primary oncologist is Dr. Delight Hoh.  The patient had undergone SBRT x 2 to her right lower lobe and right midlung due to what where apparent metastatic deposits.  She has also previously completed concurrent chemoradiation therapy as well as FOLFOX for locally advanced rectal cancer.  The most recent follow-up CT performed earlier this month showed that she had 3 enlarging nodules on the right lower lobe that are concerning for potential metastatic rectal cancer.  I have reviewed those films with the patient and her family.  There are also additional findings as noted below.  We are consulted consideration of robotic assisted bronchoscopy for obtaining tissue biopsy.  The patient has noted over the last 2 to 3 months a dry persistent cough.  She has not had any hemoptysis.  She has not had any fevers, chills or sweats.  No chest pain.  No dyspnea per se.  On review of her chest CT there are the aforementioned noted enlarging lung nodules and also on further review there appears to be a metallic or foreign object versus abnormal growth in the right middle lobe.  I suspect this may be the etiology of the patient's cough at this point.  She does not recall any  aspiration events.  She does have however several fractured teeth including a front incisor that is fractured.  She has multiple fillings noted.  She has not noted any loose or lost filling.  She does not voice any other complaints.  She is here today with her daughter who participated in the decision making for the patient during the visit as well.  We discussed the diagnostic modalities available and discussed in particular robotic assisted navigational bronchoscopy.  All of the potential benefits, limitations and complications of the procedure were discussed with the patient and her daughter.  Patient agrees to proceed.   Review of Systems A 10 point review of systems was performed and it is as noted above otherwise negative.  Past Medical History:  Diagnosis Date   Arthritis    Cataract    bilateral   Chicken pox    Colon polyp    Follicular lymphoma ([place]) 08/2016   lymph nodes    Hyperlipidemia    Osteoporosis    Rectal carcinoma ([place])    Past Surgical History:  Procedure Laterality Date   AXILLARY LYMPH NODE DISSECTION Right 08/21/2016   Procedure: AXILLARY LYMPH NODE excision;  Surgeon: Leonie Green, MD;  Location: [facility] ORS;  Service: General;  Laterality: Right;   CATARACT EXTRACTION, BILATERAL Bilateral    COLONOSCOPY N/A 09/19/2020   Procedure: COLONOSCOPY;  Surgeon: Lesly Rubenstein, MD;  Location: [facility] ENDOSCOPY;  Service: Endoscopy;  Laterality: N/A;   EYE SURGERY  JOINT REPLACEMENT Left    PORTA CATH INSERTION N/A 09/16/2017   Procedure: PORTA CATH INSERTION;  Surgeon: Algernon Huxley, MD;  Location: [place] [facility];  Service: Cardiovascular;  Laterality: N/A;   TONSILLECTOMY     TOTAL HIP ARTHROPLASTY Left 1992   Patient Active Problem List   Diagnosis Date Noted   Chemotherapy-induced neuropathy ([place]) 07/21/2021   Rectal cancer ([place]) 11/02/2020   Rectal mass 10/11/2020   Gastroesophageal reflux disease without esophagitis 08/25/2018    Protein-calorie malnutrition, severe 12/20/2017   Anemia    Thrombocytopenia (HCC)    Sepsis ([place]) 12/19/2017   Anemia due to antineoplastic chemotherapy 12/17/2017   Goals of care, counseling/discussion 09/19/2017   Axillary mass, right 04/03/2017   Pure hypercholesterolemia 09/10/2016   Postmenopausal osteoporosis 09/47/0962   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 ([place]) 07/24/2016   Mass of right chest wall 06/06/2016   Primary osteoarthritis involving multiple joints 12/14/2015   Transient insomnia 08/27/2015   Mixed hyperlipidemia 03/01/2015   Nocturnal muscle cramps 03/01/2015   Osteoporosis 11/08/2014   Primary osteoarthritis of both knees 07/05/2014   Hyperlipidemia 03/19/2014   Osteoarthritis of knee 03/19/2014   Abnormal mammogram 03/03/2013   Vitamin D deficiency 05/09/2012   Family History  Problem Relation Age of Onset   Diabetes Sister    Lung cancer Brother    Diabetes Brother    Basal cell carcinoma Daughter    Breast cancer Paternal Aunt    Social History   Tobacco Use   Smoking status: Never   Smokeless tobacco: Never  Substance Use Topics   Alcohol use: No   No Known Allergies  Current Meds  Medication Sig   diclofenac sodium (VOLTAREN) 1 % GEL APPLY 2 GRAMS TOPICALLY 2 (TWO) TIMES DAILY   diphenoxylate-atropine (LOMOTIL) 2.5-0.025 MG tablet TAKE 2 TABLETS BY MOUTH 4 (FOUR) TIMES DAILY AS NEEDED FOR DIARRHEA OR LOOSE STOOLS.   gabapentin (NEURONTIN) 300 MG capsule TAKE 1 CAPSULE BY MOUTH TWICE A DAY   ibuprofen (ADVIL) 400 MG tablet Take 400 mg by mouth every 6 (six) hours as needed.   ondansetron (ZOFRAN) 8 MG tablet Take 1 tablet (8 mg total) by mouth every 8 (eight) hours as needed for nausea or vomiting.   pantoprazole (PROTONIX) 20 MG tablet Take 1 tablet by mouth daily.   tiZANidine (ZANAFLEX) 2 MG tablet TAKE 1 TABLET BY MOUTH NIGHTLY AS NEEDED FOR MUSCLE SPASMS.   traMADol-acetaminophen (ULTRACET) 37.5-325 MG tablet  Take 1 tablet by mouth every 8 (eight) hours as needed.   Immunization History  Administered Date(s) Administered   Influenza-Unspecified 09/20/2014   PFIZER(Purple Top)SARS-COV-2 Vaccination 01/07/2020, 02/02/2020   Unspecified SARS-COV-2 Vaccination 02/02/2020   Zoster Recombinat (Shingrix) 11/29/2019, 03/20/2020   Zoster, Live 09/20/2014       Objective:   Physical Exam BP 120/78 (BP Location: Left Arm, Cuff Size: Normal)   Pulse (!) 107   Temp 98.3 F (36.8 C)   Ht '4\' 10"'$  (1.473 m)   Wt 90 lb 9.6 oz (41.1 kg)   SpO2 91%   BMI 18.94 kg/m  GENERAL: Thin, elderly female, pale, fully ambulatory, positional dyspnea. HEAD: Normocephalic, atraumatic.  EYES: Pupils equal, round, reactive to light.  No scleral icterus.  MOUTH: Very poor dentition, fractured/missing teeth, in particular fractured incisor.  Oral mucosa moist.  Coated tongue. NECK: Supple. No thyromegaly. Trachea midline. No JVD.  No adenopathy. PULMONARY: Good air entry bilaterally.  No adventitious sounds. CARDIOVASCULAR: S1 and S2.  Regular rate and rhythm.  No rubs, murmurs or gallops heard. ABDOMEN: Scaphoid otherwise benign. MUSCULOSKELETAL: No joint deformity, no clubbing, no edema.  NEUROLOGIC: Grossly nonfocal.  Fully ambulatory.  Speech is fluent. SKIN: Intact,warm,dry. PSYCH: Mood and behavior normal.  Spirometry was performed today that showed no evidence of airway obstruction.  There may be a component of restriction.  This can be evaluated at a later date with full PFTs.   Representative image of the CT performed 12 September 2022 showing 3 distinct nodules on the right lower lobe:      Representative image of the CT performed 12 September 2022 showing potential metallic object in the airway (arrow):    Assessment & Plan:     ICD-10-CM   1. Lung nodules  R91.8    The patient has 3 lower lobe enlarging nodules Biopsy with robotic assisted navigational bronchoscopy Procedure scheduled 27 November     2. Chronic cough  R05.3 Spirometry with Graph    Spirometry with Graph   2 to 3 months duration Nonproductive Query due to obstruction right middle lobe    3. Rectal cancer ([place])  C20    Known stage IV rectal cancer This issue adds complexity to her management vis--vis lung nodules    4. 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    Answer:   [facility]   Spirometry with Graph    Order Specific Question:   Where should this test be performed?    Answer:   [facility]   Benefits, limitations and potential complications of the procedure were discussed with the patient/family.  Complications from bronchoscopy are rare and most often minor, but if they occur they may include breathing difficulty, vocal cord spasm, hoarseness, slight fever, vomiting, dizziness, bronchospasm, infection, low blood oxygen, bleeding from biopsy site, or an allergic reaction to medications.  It is uncommon for patients to experience other more serious complications for example: Collapsed lung requiring chest tube placement, respiratory failure, heart attack and/or cardiac arrhythmia.  Patient agrees to proceed.  Procedure scheduled for 27 November at 12:30 PM patient will require [facility] protocol CT for airway mapping.  We will see the patient in follow-up in 4 to 6 weeks time however we will stay in contact with her before during and after procedure.  She is to contact us prior to her follow-up appointment should any new difficulties arise.   Renold Don, MD Advanced Bronchoscopy PCCM [facility]    *This note was dictated using voice recognition software/Dragon.  Despite best efforts to proofread, errors can occur which can change the meaning. Any  transcriptional errors that result from this process are unintentional and may not be fully corrected at the time of dictation.

## 2022-09-18 NOTE — Telephone Encounter (Signed)
Robotic Bronch 10/01/2022 12:30pm Lung Nodule [id number]  Rodena Piety please see bronch info

## 2022-09-18 NOTE — Progress Notes (Signed)
Subjective:    Patient ID: Veronica Boyd, female    DOB: [birth year], 76 y.o.   MRN: [id number] Patient Care Team: Tracie Harrier, MD as PCP - General (Internal Medicine) Lloyd Huger, MD as Consulting Physician (Oncology) [name], MD as Referring Physician (Radiation Oncology) Jacquelin Hawking, NP as Nurse Practitioner (Oncology) Clent Jacks, RN as Oncology Nurse Navigator  Chief Complaint  Patient presents with   Consult    Lung nodule. CT on 09/12/2022. SOB with exertion.  Dry cough for 2-3 months.    HPI Veronica Boyd is a 76 year old lifelong never smoker who is kindly referred by Dr. Berton Mount for pulmonary nodules in the setting of stage IV rectal cancer and a history of follicular lymphoma.  Her primary oncologist is Dr. Delight Hoh.  The patient had undergone SBRT x 2 to her right lower lobe and right midlung due to what where apparent metastatic deposits.  She has also previously completed concurrent chemoradiation therapy as well as FOLFOX for locally advanced rectal cancer.  The most recent follow-up CT performed earlier this month showed that she had 3 enlarging nodules on the right lower lobe that are concerning for potential metastatic rectal cancer.  I have reviewed those films with the patient and her family.  There are also additional findings as noted below.  We are consulted consideration of robotic assisted bronchoscopy for obtaining tissue biopsy.  The patient has noted over the last 2 to 3 months a dry persistent cough.  She has not had any hemoptysis.  She has not had any fevers, chills or sweats.  No chest pain.  No dyspnea per se.  On review of her chest CT there are the aforementioned noted enlarging lung nodules and also on further review there appears to be a metallic or foreign object versus abnormal growth in the right middle lobe.  I suspect this may be the etiology of the patient's cough at this point.  She does not recall any  aspiration events.  She does have however several fractured teeth including a front incisor that is fractured.  She has multiple fillings noted.  She has not noted any loose or lost filling.  She does not voice any other complaints.  She is here today with her daughter who participated in the decision making for the patient during the visit as well.  We discussed the diagnostic modalities available and discussed in particular robotic assisted navigational bronchoscopy.  All of the potential benefits, limitations and complications of the procedure were discussed with the patient and her daughter.  Patient agrees to proceed.   Review of Systems A 10 point review of systems was performed and it is as noted above otherwise negative.  Past Medical History:  Diagnosis Date   Arthritis    Cataract    bilateral   Chicken pox    Colon polyp    Follicular lymphoma ([place]) 08/2016   lymph nodes    Hyperlipidemia    Osteoporosis    Rectal carcinoma ([place])    Past Surgical History:  Procedure Laterality Date   AXILLARY LYMPH NODE DISSECTION Right 08/21/2016   Procedure: AXILLARY LYMPH NODE excision;  Surgeon: Leonie Green, MD;  Location: [facility] ORS;  Service: General;  Laterality: Right;   CATARACT EXTRACTION, BILATERAL Bilateral    COLONOSCOPY N/A 09/19/2020   Procedure: COLONOSCOPY;  Surgeon: Lesly Rubenstein, MD;  Location: [facility] ENDOSCOPY;  Service: Endoscopy;  Laterality: N/A;   EYE SURGERY  JOINT REPLACEMENT Left    PORTA CATH INSERTION N/A 09/16/2017   Procedure: PORTA CATH INSERTION;  Surgeon: Algernon Huxley, MD;  Location: [place] [facility];  Service: Cardiovascular;  Laterality: N/A;   TONSILLECTOMY     TOTAL HIP ARTHROPLASTY Left 1992   Patient Active Problem List   Diagnosis Date Noted   Chemotherapy-induced neuropathy ([place]) 07/21/2021   Rectal cancer ([place]) 11/02/2020   Rectal mass 10/11/2020   Gastroesophageal reflux disease without esophagitis 08/25/2018    Protein-calorie malnutrition, severe 12/20/2017   Anemia    Thrombocytopenia (HCC)    Sepsis ([place]) 12/19/2017   Anemia due to antineoplastic chemotherapy 12/17/2017   Goals of care, counseling/discussion 09/19/2017   Axillary mass, right 04/03/2017   Pure hypercholesterolemia 09/10/2016   Postmenopausal osteoporosis 42/68/3419   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 ([place]) 07/24/2016   Mass of right chest wall 06/06/2016   Primary osteoarthritis involving multiple joints 12/14/2015   Transient insomnia 08/27/2015   Mixed hyperlipidemia 03/01/2015   Nocturnal muscle cramps 03/01/2015   Osteoporosis 11/08/2014   Primary osteoarthritis of both knees 07/05/2014   Hyperlipidemia 03/19/2014   Osteoarthritis of knee 03/19/2014   Abnormal mammogram 03/03/2013   Vitamin D deficiency 05/09/2012   Family History  Problem Relation Age of Onset   Diabetes Sister    Lung cancer Brother    Diabetes Brother    Basal cell carcinoma Daughter    Breast cancer Paternal Aunt    Social History   Tobacco Use   Smoking status: Never   Smokeless tobacco: Never  Substance Use Topics   Alcohol use: No   No Known Allergies  Current Meds  Medication Sig   diclofenac sodium (VOLTAREN) 1 % GEL APPLY 2 GRAMS TOPICALLY 2 (TWO) TIMES DAILY   diphenoxylate-atropine (LOMOTIL) 2.5-0.025 MG tablet TAKE 2 TABLETS BY MOUTH 4 (FOUR) TIMES DAILY AS NEEDED FOR DIARRHEA OR LOOSE STOOLS.   gabapentin (NEURONTIN) 300 MG capsule TAKE 1 CAPSULE BY MOUTH TWICE A DAY   ibuprofen (ADVIL) 400 MG tablet Take 400 mg by mouth every 6 (six) hours as needed.   ondansetron (ZOFRAN) 8 MG tablet Take 1 tablet (8 mg total) by mouth every 8 (eight) hours as needed for nausea or vomiting.   pantoprazole (PROTONIX) 20 MG tablet Take 1 tablet by mouth daily.   tiZANidine (ZANAFLEX) 2 MG tablet TAKE 1 TABLET BY MOUTH NIGHTLY AS NEEDED FOR MUSCLE SPASMS.   traMADol-acetaminophen (ULTRACET) 37.5-325 MG tablet  Take 1 tablet by mouth every 8 (eight) hours as needed.   Immunization History  Administered Date(s) Administered   Influenza-Unspecified 09/20/2014   PFIZER(Purple Top)SARS-COV-2 Vaccination 01/07/2020, 02/02/2020   Unspecified SARS-COV-2 Vaccination 02/02/2020   Zoster Recombinat (Shingrix) 11/29/2019, 03/20/2020   Zoster, Live 09/20/2014       Objective:   Physical Exam BP 120/78 (BP Location: Left Arm, Cuff Size: Normal)   Pulse (!) 107   Temp 98.3 F (36.8 C)   Ht '4\' 10"'$  (1.473 m)   Wt 90 lb 9.6 oz (41.1 kg)   SpO2 91%   BMI 18.94 kg/m  GENERAL: Thin, elderly female, pale, fully ambulatory, positional dyspnea. HEAD: Normocephalic, atraumatic.  EYES: Pupils equal, round, reactive to light.  No scleral icterus.  MOUTH: Very poor dentition, fractured/missing teeth, in particular fractured incisor.  Oral mucosa moist.  Coated tongue. NECK: Supple. No thyromegaly. Trachea midline. No JVD.  No adenopathy. PULMONARY: Good air entry bilaterally.  No adventitious sounds. CARDIOVASCULAR: S1 and S2.  Regular rate and rhythm.  No rubs, murmurs or gallops heard. ABDOMEN: Scaphoid otherwise benign. MUSCULOSKELETAL: No joint deformity, no clubbing, no edema.  NEUROLOGIC: Grossly nonfocal.  Fully ambulatory.  Speech is fluent. SKIN: Intact,warm,dry. PSYCH: Mood and behavior normal.  Spirometry was performed today that showed no evidence of airway obstruction.  There may be a component of restriction.  This can be evaluated at a later date with full PFTs.   Representative image of the CT performed 12 September 2022 showing 3 distinct nodules on the right lower lobe:      Representative image of the CT performed 12 September 2022 showing potential metallic object in the airway (arrow):    Assessment & Plan:     ICD-10-CM   1. Lung nodules  R91.8    The patient has 3 lower lobe enlarging nodules Biopsy with robotic assisted navigational bronchoscopy Procedure scheduled 27 November     2. Chronic cough  R05.3 Spirometry with Graph    Spirometry with Graph   2 to 3 months duration Nonproductive Query due to obstruction right middle lobe    3. Rectal cancer ([place])  C20    Known stage IV rectal cancer This issue adds complexity to her management vis--vis lung nodules    4. 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    Answer:   [facility]   Spirometry with Graph    Order Specific Question:   Where should this test be performed?    Answer:   [facility]   Benefits, limitations and potential complications of the procedure were discussed with the patient/family.  Complications from bronchoscopy are rare and most often minor, but if they occur they may include breathing difficulty, vocal cord spasm, hoarseness, slight fever, vomiting, dizziness, bronchospasm, infection, low blood oxygen, bleeding from biopsy site, or an allergic reaction to medications.  It is uncommon for patients to experience other more serious complications for example: Collapsed lung requiring chest tube placement, respiratory failure, heart attack and/or cardiac arrhythmia.  Patient agrees to proceed.  Procedure scheduled for 27 November at 12:30 PM patient will require [facility] protocol CT for airway mapping.  We will see the patient in follow-up in 4 to 6 weeks time however we will stay in contact with her before during and after procedure.  She is to contact us prior to her follow-up appointment should any new difficulties arise.   Renold Don, MD Advanced Bronchoscopy PCCM [facility]    *This note was dictated using voice recognition software/Dragon.  Despite best efforts to proofread, errors can occur which can change the meaning. Any  transcriptional errors that result from this process are unintentional and may not be fully corrected at the time of dictation.

## 2022-09-19 ENCOUNTER — Encounter: Payer: Self-pay | Admitting: Oncology

## 2022-09-19 ENCOUNTER — Other Ambulatory Visit: Payer: Self-pay

## 2022-09-19 ENCOUNTER — Inpatient Hospital Stay (HOSPITAL_BASED_OUTPATIENT_CLINIC_OR_DEPARTMENT_OTHER): Payer: 59 | Admitting: Oncology

## 2022-09-19 VITALS — BP 107/65 | HR 85 | Temp 97.2°F | Resp 16 | Ht <= 58 in | Wt 90.0 lb

## 2022-09-19 DIAGNOSIS — C8208 Follicular lymphoma grade I, lymph nodes of multiple sites: Secondary | ICD-10-CM

## 2022-09-19 DIAGNOSIS — C20 Malignant neoplasm of rectum: Secondary | ICD-10-CM | POA: Diagnosis not present

## 2022-09-19 DIAGNOSIS — R009 Unspecified abnormalities of heart beat: Secondary | ICD-10-CM | POA: Diagnosis not present

## 2022-09-19 NOTE — Telephone Encounter (Signed)
For the code [phone] Prior Auth Not Required Refer # [id number]

## 2022-09-19 NOTE — Progress Notes (Signed)
[place]  [phone] Fax:(336) [phone]  ID: Veronica Boyd OB: [birth year]  MR#: [id number]  OZD#:[id number]  Patient Care Team: Tracie Harrier, MD as PCP - General (Internal Medicine) Lloyd Huger, MD as Consulting Physician (Oncology) [name], MD as Referring Physician (Radiation Oncology) Jacquelin Hawking, NP as Nurse Practitioner (Oncology) Clent Jacks, RN as Oncology Nurse Navigator Tyler Pita, MD as Consulting Physician (Pulmonary Disease)  CHIEF COMPLAINT: Stage IV rectal cancer, history of follicular lymphoma.  INTERVAL HISTORY: Patient returns to clinic today as an add-on for further evaluation and discussion of her imaging results.  She has noticed increasing weakness and fatigue over the past several weeks.  Recent CT scan revealed an enlarging pulmonary mass.  She otherwise feels well.  She continues to have a poor appetite.  She continues to have a chronic peripheral neuropathy.  She has no other neurologic complaints.  She denies any recent fevers or illnesses.  She denies any chest pain, shortness of breath, or hemoptysis.  She has a chronic cough.  She denies any nausea, vomiting, constipation or diarrhea.  She has no urinary complaints.  Patient offers no further complaints today.  REVIEW OF SYSTEMS:   Review of Systems  Constitutional:  Positive for malaise/fatigue. Negative for fever and weight loss.  Respiratory:  Positive for cough. Negative for hemoptysis and shortness of breath.   Cardiovascular: Negative.  Negative for chest pain and leg swelling.  Gastrointestinal:  Negative for abdominal pain, blood in stool, diarrhea, melena, nausea and vomiting.  Genitourinary: Negative.  Negative for dysuria.  Musculoskeletal: Negative.  Negative for back pain, joint pain and neck pain.  Skin: Negative.  Negative for rash.  Neurological:  Positive for tingling, sensory change and weakness. Negative for focal  weakness and headaches.  Psychiatric/Behavioral: Negative.  The patient is not nervous/anxious and does not have insomnia.     As per HPI. Otherwise, a complete review of systems is negative.   PAST MEDICAL HISTORY: Past Medical History:  Diagnosis Date   Arthritis    Cataract    bilateral   Chicken pox    Colon polyp    Follicular lymphoma ([place]) 08/2016   lymph nodes    Hyperlipidemia    Osteoporosis    Rectal carcinoma ([place])     PAST SURGICAL HISTORY: Past Surgical History:  Procedure Laterality Date   AXILLARY LYMPH NODE DISSECTION Right 08/21/2016   Procedure: AXILLARY LYMPH NODE excision;  Surgeon: Leonie Green, MD;  Location: [facility];  Service: General;  Laterality: Right;   CATARACT EXTRACTION, BILATERAL Bilateral    COLONOSCOPY N/A 09/19/2020   Procedure: COLONOSCOPY;  Surgeon: Lesly Rubenstein, MD;  Location: [facility];  Service: Endoscopy;  Laterality: N/A;   EYE SURGERY     JOINT REPLACEMENT Left    PORTA CATH INSERTION N/A 09/16/2017   Procedure: PORTA CATH INSERTION;  Surgeon: Algernon Huxley, MD;  Location: [facility];  Service: Cardiovascular;  Laterality: N/A;   TONSILLECTOMY     TOTAL HIP ARTHROPLASTY Left 1992    FAMILY HISTORY: Family History  Problem Relation Age of Onset   Diabetes Sister    Lung cancer Brother    Diabetes Brother    Basal cell carcinoma Daughter    Breast cancer Paternal Aunt     ADVANCED DIRECTIVES (Y/N):  N  HEALTH MAINTENANCE: Social History   Tobacco Use   Smoking status: Never   Smokeless tobacco: Never  Vaping Use  Vaping Use: Never used  Substance Use Topics   Alcohol use: No   Drug use: No     Colonoscopy:  PAP:  Bone density:  Lipid panel:  No Known Allergies  Current Outpatient Medications  Medication Sig Dispense Refill   diclofenac sodium (VOLTAREN) 1 % GEL APPLY 2 GRAMS TOPICALLY 2 (TWO) TIMES DAILY     diphenoxylate-atropine (LOMOTIL) 2.5-0.025 MG tablet TAKE 2 TABLETS  BY MOUTH 4 (FOUR) TIMES DAILY AS NEEDED FOR DIARRHEA OR LOOSE STOOLS. 60 tablet 1   gabapentin (NEURONTIN) 300 MG capsule TAKE 1 CAPSULE BY MOUTH TWICE A DAY 60 capsule 2   ibuprofen (ADVIL) 400 MG tablet Take 400 mg by mouth every 6 (six) hours as needed.     ondansetron (ZOFRAN) 8 MG tablet Take 1 tablet (8 mg total) by mouth every 8 (eight) hours as needed for nausea or vomiting. 20 tablet 0   pantoprazole (PROTONIX) 20 MG tablet Take 1 tablet by mouth daily.     tiZANidine (ZANAFLEX) 2 MG tablet TAKE 1 TABLET BY MOUTH NIGHTLY AS NEEDED FOR MUSCLE SPASMS. 90 tablet 1   traMADol-acetaminophen (ULTRACET) 37.5-325 MG tablet Take 1 tablet by mouth every 8 (eight) hours as needed.     alendronate (FOSAMAX) 70 MG tablet TAKE 1 TABLET EVERY 7 DAYS TAKE WITH A FULL GLASS OF WATER. DO NOT LIE DOWN FOR THE NEXT 30 MIN. (Patient not taking: Reported on 06/05/2022)  3   benzonatate (TESSALON) 100 MG capsule Take 1 capsule (100 mg total) by mouth 3 (three) times daily as needed for cough. (Patient not taking: Reported on 06/05/2022) 30 capsule 0   Multiple Vitamins-Minerals (CENTRUM SILVER PO) Take 1 tablet by mouth daily. (Patient not taking: Reported on 09/18/2022)     POTASSIUM CHLORIDE PO Take by mouth. (Patient not taking: Reported on 09/18/2022)     predniSONE (STERAPRED UNI-PAK 21 TAB) 10 MG (21) TBPK tablet Taper as directed (Patient not taking: Reported on 09/18/2022) 21 tablet 0   No current facility-administered medications for this visit.   Facility-Administered Medications Ordered in Other Visits  Medication Dose Route Frequency Provider Last Rate Last Admin   heparin lock flush 100 unit/mL  500 Units Intravenous Once Grayland Ormond, Kathlene November, MD       heparin lock flush 100 unit/mL  500 Units Intracatheter PRN Grayland Ormond, Kathlene November, MD       heparin lock flush 100 unit/mL  500 Units Intravenous Once Lloyd Huger, MD       heparin lock flush 100 unit/mL  500 Units Intravenous Once Lloyd Huger, MD       sodium chloride flush (NS) 0.9 % injection 10 mL  10 mL Intravenous PRN Lloyd Huger, MD   10 mL at 12/04/17 0901   sodium chloride flush (NS) 0.9 % injection 10 mL  10 mL Intracatheter PRN Lloyd Huger, MD       sodium chloride flush (NS) 0.9 % injection 10 mL  10 mL Intravenous PRN Lloyd Huger, MD   10 mL at 04/24/21 0839   yttrium-90 injection 76.7 millicurie  34.1 millicurie Intravenous Once Chrystal, Eulas Post, MD        OBJECTIVE: Vitals:   09/19/22 1000  BP: 107/65  Pulse: 85  Resp: 16  Temp: (!) 97.2 F (36.2 C)  SpO2: 95%      Body mass index is 18.81 kg/m.    ECOG FS:0 - Asymptomatic  General: Thin, no acute distress. Eyes: Pink conjunctiva,  anicteric sclera. HEENT: Normocephalic, moist mucous membranes. Lungs: No audible wheezing or coughing. Heart: Regular rate and rhythm. Abdomen: Soft, nontender, no obvious distention. Musculoskeletal: No edema, cyanosis, or clubbing. Neuro: Alert, answering all questions appropriately. Cranial nerves grossly intact. Skin: No rashes or petechiae noted. Psych: Normal affect.  LAB RESULTS:  Lab Results  Component Value Date   NA 139 09/05/2022   K 4.0 09/05/2022   CL 106 09/05/2022   CO2 27 09/05/2022   GLUCOSE 77 09/05/2022   BUN 12 09/05/2022   CREATININE 0.71 09/05/2022   CALCIUM 8.9 09/05/2022   PROT 6.8 09/05/2022   ALBUMIN 3.6 09/05/2022   AST 20 09/05/2022   ALT 11 09/05/2022   ALKPHOS 68 09/05/2022   BILITOT 0.6 09/05/2022   GFRNONAA >60 09/05/2022   GFRAA >60 06/28/2020    Lab Results  Component Value Date   WBC 6.9 09/05/2022   NEUTROABS 4.1 09/05/2022   HGB 13.5 09/05/2022   HCT 42.4 09/05/2022   MCV 97.0 09/05/2022   PLT 201 09/05/2022     STUDIES: CT Chest W Contrast  Result Date: 09/14/2022 CLINICAL DATA:  Non-small cell lung cancer, metastatic, assess treatment response. Radiation therapy and chemotherapy in July 2023. Right lower lobe nodule treated  with radiation October 2022. Severe cough. * Tracking Code: BO * EXAM: CT CHEST WITH CONTRAST TECHNIQUE: Multidetector CT imaging of the chest was performed during intravenous contrast administration. RADIATION DOSE REDUCTION: This exam was performed according to the departmental dose-optimization program which includes automated exposure control, adjustment of the mA and/or kV according to patient size and/or use of iterative reconstruction technique. CONTRAST:  51m OMNIPAQUE IOHEXOL 300 MG/ML  SOLN COMPARISON:  PET 04/04/2022 and CT chest 06/01/2022. FINDINGS: Cardiovascular: Right IJ Port-A-Cath terminates at the SVC RA junction. Atherosclerotic calcification of the aorta, aortic valve and coronary arteries. Heart size is at the upper limits of normal. No pericardial effusion. Mediastinum/Nodes: No pathologically enlarged mediastinal, hilar or axillary lymph nodes. Esophagus is grossly unremarkable. Lungs/Pleura: Areas of post treatment scarring in the right upper and right lower lobes. New and enlarging nodules in the anterior right lower lobe. Lateral right lower lobe nodule measures 1.3 x 1.6 cm (3/102), previously 6 x 8 mm. Medial 6 x 10 mm nodule in the right lower lobe (3/102) is new. Trace right pleural fluid. There is a new metallic linear structure at the bifurcation of the right middle lobe bronchus (3/72), with narrowing of the distal right middle lobe bronchus and mucoid impaction involving the medial segmental bronchus. Airway is otherwise unremarkable. Upper Abdomen: Visualized portions of the liver, gallbladder, adrenal glands, kidneys, spleen, pancreas, stomach and bowel are grossly unremarkable. No upper abdominal adenopathy. Musculoskeletal: Degenerative changes in the spine. No worrisome lytic or sclerotic lesions. IMPRESSION: 1. New/enlarging right lower lobe nodules, due to either recurrent lung cancer or metastatic rectal cancer. 2. New linear metallic structure at the bifurcation of the  right middle lobe bronchus with narrowing of the distal right middle lobe bronchus and mucoid impaction in the medial segmental bronchus. Please correlate clinically. An intervening procedural note is not identified in Epic. 3. Post treatment changes throughout the right lung, stable. 4. Trace right pleural fluid. 5. Aortic atherosclerosis (ICD10-I70.0). Coronary artery calcification. Electronically Signed   By: MLorin PicketM.D.   On: 09/14/2022 10:50    ONCOLOGY HISTORY:  Biopsy confirmed rectal cancer.  [facility] extension of the tumor beyond the wall into the rectovaginal  recess with suspected invasion of the vaginal wall posteriorly.  Tumor also appears to involve the internal anal sphincter.  There are also numerous prominent presacral and mesorectal lymph nodes highly suspicious for malignancy.  PET scan results from November 17, 2020 reviewed independently with 3 hypermetabolic right lower lobe lung nodules consistent with pulmonary metastasis.  Patient completed cycle 8 of FOLFOX on Mar 29, 2021.  She then completed cycle 5 of her 5-FU pump on May 26, 2021 and XRT on June 01, 2021.  PET scan results from July 17, 2021 reviewed independently with no evidence of disease other than enlarging right lower lobe lung lesion.  Patient completed SBRT to the right lung lesion on August 22, 2021.  Repeat PET scan on July 25, 2021 revealed minimal residual disease in the rectum and likely resolution of lesions in the lung.    ASSESSMENT: Stage IV rectal cancer, history of follicular lymphoma. PLAN:    1.  Stage IV rectal cancer: CT results from November 2023 reviewed independently and report as above.  CEA is within normal limits.  Patient has consultation with pulmonology for biopsy.  Will also obtain a PET scan for further evaluation.  Return to clinic 3 to 4 days after her biopsy to discuss the results and treatment planning.   2.  Pulmonary  nodule: Progressive.  Unclear if this is recurrent rectal cancer, or a new primary.  PET scan and biopsy as above.   3. Recurrent follicular lymphoma: CT scan results from December 23, 2019 reviewed independently with no obvious evidence of recurrent or progressive disease.  PET scan results as above consistent with rectal cancer metastasis and no evidence of lymphoma.   Patient received Zevalin on July 30, 2017 with not much therapeutic effect.  She subsequently underwent cycles 5 of R-CHOP chemotherapy with Neulasta support completing on December 12, 2017.  Patient continues to be in complete remission.  4.  Neuropathy: Chronic and unchanged. 5.  Weakness and fatigue: Slightly worse.  Monitor.  I spent a total of 30 minutes reviewing chart data, face-to-face evaluation with the patient, counseling and coordination of care as detailed above.    Patient expressed understanding and was in agreement with this plan. She also understands that She can call clinic at any time with any questions, concerns, or complaints.    Cancer Staging  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 [facility]) Staging form: Lymphoid Neoplasms, AJCC 6th Edition - Clinical stage from 08/27/2016: Stage IIE - Signed by Lloyd Huger, MD on 08/27/2016  Rectal cancer [facility]) Staging form: Colon and Rectum, AJCC 8th Edition - Clinical: Stage IVA Larwance Sachs, Noelle.Marry) - Signed by Lloyd Huger, MD on 11/02/2020   Lloyd Huger, MD   09/19/2022 2:06 PM

## 2022-09-19 NOTE — Telephone Encounter (Signed)
PreAdmit- 09/26/2022 1-5pm Covid Test the day of surgery  I have notified the patient and her daughter. Nothing further needed.

## 2022-09-24 ENCOUNTER — Ambulatory Visit
Admission: RE | Admit: 2022-09-24 | Discharge: 2022-09-24 | Disposition: A | Discharge status: Home / Self Care | Payer: 59 | Source: Ambulatory Visit | Attending: Pulmonary Disease | Admitting: Pulmonary Disease

## 2022-09-24 ENCOUNTER — Telehealth: Payer: Self-pay | Admitting: *Deleted

## 2022-09-24 DIAGNOSIS — R911 Solitary pulmonary nodule: Secondary | ICD-10-CM | POA: Diagnosis present

## 2022-09-24 NOTE — Telephone Encounter (Deleted)
FMLA form signed and faxed to patient at [phone] per her request

## 2022-09-24 NOTE — Telephone Encounter (Signed)
Received FMLA form Friday to be completed

## 2022-09-24 NOTE — Telephone Encounter (Signed)
Form completed and sent for physician signature

## 2022-09-25 ENCOUNTER — Encounter: Payer: Self-pay | Admitting: Oncology

## 2022-09-25 NOTE — Telephone Encounter (Signed)
Form signed,and faxed to [employer] and to Mrs Ishmael Holter

## 2022-09-26 ENCOUNTER — Encounter
Admission: RE | Admit: 2022-09-26 | Discharge: 2022-09-26 | Disposition: A | Discharge status: Home / Self Care | Payer: 59 | Source: Ambulatory Visit | Attending: Pulmonary Disease | Admitting: Pulmonary Disease

## 2022-09-26 ENCOUNTER — Encounter: Payer: Self-pay | Admitting: Pulmonary Disease

## 2022-09-26 VITALS — Ht <= 58 in | Wt 90.0 lb

## 2022-09-26 DIAGNOSIS — Z1152 Encounter for screening for COVID-19: Secondary | ICD-10-CM

## 2022-09-26 NOTE — Patient Instructions (Addendum)
Your procedure is scheduled on: Monday, November 27 Report to the Registration Desk on the 1st floor of the [facility]. To find out your arrival time, please call [phone] between 1PM - 3PM on: Friday, November 24 If your arrival time is 6:00 am, do not arrive prior to that time as the [facility] entrance doors do not open until 6:00 am.  REMEMBER: Instructions that are not followed completely may result in serious medical risk, up to and including death; or upon the discretion of your surgeon and anesthesiologist your surgery may need to be rescheduled.  Do not eat or drink after midnight the night before surgery.  No gum chewing, lozengers or hard candies.  TAKE THESE MEDICATIONS THE MORNING OF SURGERY WITH A SIP OF WATER:  Pantoprazole (Protonix) - (take one the night before and one on the morning of surgery - helps to prevent nausea after surgery.)  One week prior to surgery: starting today, November 22 Stop Anti-inflammatories (NSAIDS) such as Advil, Aleve, Ibuprofen, Motrin, Naproxen, Naprosyn and Aspirin based products such as Excedrin, Goodys Powder, BC Powder. Stop ANY OVER THE COUNTER supplements until after surgery. You may however, continue to take Tylenol if needed for pain up until the day of surgery.  No Alcohol for 24 hours before or after surgery.  No Smoking including e-cigarettes for 24 hours prior to surgery.  No chewable tobacco products for at least 6 hours prior to surgery.  No nicotine patches on the day of surgery.  Do not use any "recreational" drugs for at least a week prior to your surgery.  Please be advised that the combination of cocaine and anesthesia may have negative outcomes, up to and including death. If you test positive for cocaine, your surgery will be cancelled.  On the morning of surgery brush your teeth with toothpaste and water, you may rinse your mouth with mouthwash if you wish. Do not swallow any toothpaste or mouthwash.  Do not  wear jewelry, make-up, hairpins, clips or nail polish.  Do not wear lotions, powders, or perfumes.   Contact lenses, hearing aids and dentures may not be worn into surgery.  Do not bring valuables to the hospital. [facility] is not responsible for any missing/lost belongings or valuables.   Notify your doctor if there is any change in your medical condition (cold, fever, infection).  Wear comfortable clothing (specific to your surgery type) to the hospital.  If you are being discharged the day of surgery, you will not be allowed to drive home. You will need a responsible adult (18 years or older) to drive you home and stay with you that night.   If you are taking public transportation, you will need to have a responsible adult (18 years or older) with you. Please confirm with your physician that it is acceptable to use public transportation.   Please call the [place] Dept. at [phone] if you have any questions about these instructions.  Surgery Visitation Policy:  Patients undergoing a surgery or procedure may have two family members or support persons with them as long as the person is not COVID-19 positive or experiencing its symptoms.

## 2022-09-30 MED ORDER — CHLORHEXIDINE GLUCONATE 0.12 % MT SOLN
15.0000 mL | Freq: Once | OROMUCOSAL | Status: AC
  Administered 2022-10-01: 15 mL via OROMUCOSAL

## 2022-09-30 MED ORDER — LACTATED RINGERS IV SOLN: INTRAVENOUS | Status: DC

## 2022-09-30 MED ORDER — ORAL CARE MOUTH RINSE: 15.0000 mL | Freq: Once | OROMUCOSAL | Status: AC

## 2022-09-30 MED ORDER — IPRATROPIUM-ALBUTEROL 0.5-2.5 (3) MG/3ML IN SOLN: 3.0000 mL | Freq: Once | RESPIRATORY_TRACT | Status: AC

## 2022-09-30 MED ORDER — SODIUM CHLORIDE 0.9 % IV SOLN: Freq: Once | INTRAVENOUS | Status: DC

## 2022-10-01 ENCOUNTER — Ambulatory Visit: Payer: 59 | Admitting: Anesthesiology

## 2022-10-01 ENCOUNTER — Ambulatory Visit: Payer: 59

## 2022-10-01 ENCOUNTER — Other Ambulatory Visit: Payer: Self-pay

## 2022-10-01 ENCOUNTER — Encounter: Payer: Self-pay | Admitting: Pulmonary Disease

## 2022-10-01 ENCOUNTER — Other Ambulatory Visit: Payer: 59

## 2022-10-01 ENCOUNTER — Encounter
Admission: RE | Disposition: A | Discharge status: Home / Self Care | Payer: Self-pay | Source: Home / Self Care | Attending: Pulmonary Disease

## 2022-10-01 ENCOUNTER — Ambulatory Visit
Admission: RE | Admit: 2022-10-01 | Discharge: 2022-10-01 | Disposition: A | Discharge status: Home / Self Care | Payer: 59 | Attending: Pulmonary Disease | Admitting: Pulmonary Disease

## 2022-10-01 DIAGNOSIS — R918 Other nonspecific abnormal finding of lung field: Secondary | ICD-10-CM

## 2022-10-01 DIAGNOSIS — R053 Chronic cough: Secondary | ICD-10-CM | POA: Diagnosis not present

## 2022-10-01 DIAGNOSIS — K219 Gastro-esophageal reflux disease without esophagitis: Secondary | ICD-10-CM | POA: Insufficient documentation

## 2022-10-01 DIAGNOSIS — C8208 Follicular lymphoma grade I, lymph nodes of multiple sites: Secondary | ICD-10-CM | POA: Insufficient documentation

## 2022-10-01 DIAGNOSIS — Z1152 Encounter for screening for COVID-19: Secondary | ICD-10-CM

## 2022-10-01 DIAGNOSIS — R911 Solitary pulmonary nodule: Secondary | ICD-10-CM

## 2022-10-01 DIAGNOSIS — C20 Malignant neoplasm of rectum: Secondary | ICD-10-CM | POA: Diagnosis not present

## 2022-10-01 DIAGNOSIS — J9809 Other diseases of bronchus, not elsewhere classified: Secondary | ICD-10-CM

## 2022-10-01 LAB — SARS CORONAVIRUS 2 BY RT PCR: SARS Coronavirus 2 by RT PCR: NEGATIVE

## 2022-10-01 SURGERY — BRONCHOSCOPY, WITH BIOPSY USING ELECTROMAGNETIC NAVIGATION
Anesthesia: General | Laterality: Right

## 2022-10-01 MED ORDER — CHLORHEXIDINE GLUCONATE 0.12 % MT SOLN
OROMUCOSAL | Status: AC
  Filled 2022-10-01: qty 15

## 2022-10-01 MED ORDER — PHENYLEPHRINE HCL-NACL 20-0.9 MG/250ML-% IV SOLN
INTRAVENOUS | Status: DC | PRN
  Administered 2022-10-01: 30 ug/min via INTRAVENOUS

## 2022-10-01 MED ORDER — PROPOFOL 10 MG/ML IV BOLUS
INTRAVENOUS | Status: DC | PRN
  Administered 2022-10-01: 80 mg via INTRAVENOUS

## 2022-10-01 MED ORDER — DEXAMETHASONE SODIUM PHOSPHATE 10 MG/ML IJ SOLN
INTRAMUSCULAR | Status: DC | PRN
  Administered 2022-10-01: 5 mg via INTRAVENOUS

## 2022-10-01 MED ORDER — ROCURONIUM BROMIDE 100 MG/10ML IV SOLN
INTRAVENOUS | Status: DC | PRN
  Administered 2022-10-01: 50 mg via INTRAVENOUS

## 2022-10-01 MED ORDER — LIDOCAINE HCL (CARDIAC) PF 100 MG/5ML IV SOSY
PREFILLED_SYRINGE | INTRAVENOUS | Status: DC | PRN
  Administered 2022-10-01: 60 mg via INTRAVENOUS

## 2022-10-01 MED ORDER — ONDANSETRON HCL 4 MG/2ML IJ SOLN
INTRAMUSCULAR | Status: AC
  Filled 2022-10-01: qty 2

## 2022-10-01 MED ORDER — ESMOLOL HCL 100 MG/10ML IV SOLN
INTRAVENOUS | Status: DC | PRN
  Administered 2022-10-01: 10 mg via INTRAVENOUS

## 2022-10-01 MED ORDER — ESMOLOL HCL 100 MG/10ML IV SOLN
INTRAVENOUS | Status: AC
  Filled 2022-10-01: qty 10

## 2022-10-01 MED ORDER — PHENYLEPHRINE HCL-NACL 20-0.9 MG/250ML-% IV SOLN
INTRAVENOUS | Status: AC
  Filled 2022-10-01: qty 250

## 2022-10-01 MED ORDER — SUGAMMADEX SODIUM 200 MG/2ML IV SOLN
INTRAVENOUS | Status: DC | PRN
  Administered 2022-10-01: 200 mg via INTRAVENOUS

## 2022-10-01 MED ORDER — PHENYLEPHRINE HCL (PRESSORS) 10 MG/ML IV SOLN
INTRAVENOUS | Status: DC | PRN
  Administered 2022-10-01 (×2): 80 ug via INTRAVENOUS
  Administered 2022-10-01: 160 ug via INTRAVENOUS

## 2022-10-01 MED ORDER — FENTANYL CITRATE (PF) 100 MCG/2ML IJ SOLN
INTRAMUSCULAR | Status: AC
  Filled 2022-10-01: qty 2

## 2022-10-01 MED ORDER — IPRATROPIUM-ALBUTEROL 0.5-2.5 (3) MG/3ML IN SOLN
RESPIRATORY_TRACT | Status: AC
  Administered 2022-10-01: 3 mL via RESPIRATORY_TRACT
  Filled 2022-10-01: qty 3

## 2022-10-01 MED ORDER — FENTANYL CITRATE (PF) 100 MCG/2ML IJ SOLN
INTRAMUSCULAR | Status: DC | PRN
  Administered 2022-10-01 (×2): 50 ug via INTRAVENOUS

## 2022-10-01 MED ORDER — ONDANSETRON HCL 4 MG/2ML IJ SOLN
INTRAMUSCULAR | Status: DC | PRN
  Administered 2022-10-01: 4 mg via INTRAVENOUS

## 2022-10-01 MED ORDER — DEXAMETHASONE SODIUM PHOSPHATE 10 MG/ML IJ SOLN
INTRAMUSCULAR | Status: AC
  Filled 2022-10-01: qty 1

## 2022-10-01 MED ORDER — PHENYLEPHRINE 80 MCG/ML (10ML) SYRINGE FOR IV PUSH (FOR BLOOD PRESSURE SUPPORT)
PREFILLED_SYRINGE | INTRAVENOUS | Status: AC
  Filled 2022-10-01: qty 10

## 2022-10-01 NOTE — Progress Notes (Signed)
Respiratory brought patient a flutter valve for her to take home, demonstrated to patient how to utilize the flutter valve.

## 2022-10-01 NOTE — Interval H&P Note (Signed)
Veronica Boyd. [place] has presented today for surgery, with the diagnosis of RIGHT lung nodules.  The various methods of treatment have been discussed with the patient and family. After consideration of risks, benefits and other options for treatment, the patient has consented to  Procedure(s): ROBOTIC ASSISTED NAVIGATIONAL BRONCHOSCOPY-RIGHT as a surgical intervention.  The patient's history has been reviewed, patient examined, no change in status, stable for surgery.  I have reviewed the patient's chart and labs.  Questions were answered to the patient's satisfaction.   Benefits, limitations and potential complications of the procedure were discussed with the patient/family.  Complications from bronchoscopy are rare and most often minor, but if they occur they may include breathing difficulty, vocal cord spasm, hoarseness, slight fever, vomiting, dizziness, bronchospasm, infection, low blood oxygen, bleeding from biopsy site, or an allergic reaction to medications.  It is uncommon for patients to experience other more serious complications for example: Collapsed lung requiring chest tube placement, respiratory failure, heart attack and/or cardiac arrhythmia.  Patient agrees to proceed.   Renold Don, MD Advanced Bronchoscopy PCCM [facility]    *This note was dictated using voice recognition software/Dragon.  Despite best efforts to proofread, errors can occur which can change the meaning. Any transcriptional errors that result from this process are unintentional and may not be fully corrected at the time of dictation.

## 2022-10-01 NOTE — Discharge Instructions (Addendum)

## 2022-10-01 NOTE — Op Note (Signed)
PROCEDURES: Bronchoscopy with therapeutic aspiration of the tracheobronchial tree Robotic assisted bronchoscopy Cellvizio probe based confocal laser endomicroscopy (pCLE) Augmented fluoroscopy   Indication: Right lower lobe nodules, history of rectal carcinoma, history of follicular lymphoma.  Preoperative Diagnosis: Lung nodules, query metastatic disease Post Procedure Diagnosis: Same as above, complete collapse of right middle lobe due to inflammatory debris Consent: Verbal/Written: obtained  Benefits, limitations and potential complications of the procedure were discussed with the patient/family.  Complications from bronchoscopy are rare and most often minor, but if they occur they may include breathing difficulty, vocal cord spasm, hoarseness, slight fever, vomiting, dizziness, bronchospasm, infection, low blood oxygen, bleeding from biopsy site, or an allergic reaction to medications.  It is uncommon for patients to experience other more serious complications for example: Collapsed lung requiring chest tube placement, respiratory failure, heart attack and/or cardiac arrhythmia.  Patient understood the potential complications and agreed to proceed.  Surgeon: Renold Don, MD Assistant/Scrub: Sullivan Lone, RRT Circulator: Annia Belt, RRT Anesthesiologist/CRNA: Chaney Born Starr,CRNA Cytotechnology: Maryan Puls Fluoroscopy technician: Alcide Goodness, RT Representatives: Alden Server, [employer]  Type of Anesthesia: General endotracheal  Procedures Performed:   Robotic bronchoscopy: Procedure consists of robotic navigation comprised of electromagnetics, optical pattern recognition and robotic kinematic data - to triangulate bronchoscope location during the procedure and provide accurate positional data to biopsy a lesion. Cellvizio probe based confocal laser endomicroscopy (pCLE) utilizing blue laser endomicroscopy. Augmented fluoroscopy with [employer].  Description  of Procedure:  Robotic bronchoscopy: The patient was brought to Procedure Room 2 (Bronchoscopy Suite) in the OR area where appropriate timeout was taken with the staff after the patient was inducted under general anesthesia.  The patient was inducted under general anesthesia and intubated by the anesthesia team.  Patient was intubated with a 8.0 ET tube without difficulty.  Tube was secured at 4 cm above the carina.  A Portex adapter was placed on the ET tube flange.  Once the patient was under adequate general anesthesia the Olympus therapeutic video bronchoscope was advanced and an anatomic airway tour and surveillance bronchoscopy was performed.The distal trachea appeared unremarkable. The main carina was sharp.  No secretions were seen in either right or left mainstem bronchi.  The right upper lobe appeared to be free of endobronchial masses, lesions, or purulent secretions.  However, the right middle lobe was totally occluded by a membrane of inflammatory debris.  A forcep biopsy was necessary to probe into the more medial aspect of this gelatinous debris.  This could not be suction.  It had to be cleared piecemeal by forcep dissection.  ROSE was consistent with inflammatory debris.  At this point the more lateral orifice was probed with forceps and again was noted to become patent once the inflammatory debris was cleared.  This had to be done with forceps as it could not be suctioned.  Examination of the right lower lobe bronchi showed no obstruction or inspissated secretions.  This process was initiated with a therapeutic bronchoscope and had to be completed with the diagnostic bronchoscope to advance the scope as far as possible into the newly reopened right middle lobe bronchi.  Examination of the LLL/LUL shows them to be free of endobronchial masses, lesions, or purulent secretions.  Once the survey and therapeutic bronchoscopies were completed, registration for the augmented fluoroscopy ([employer])  was then performed with the fluoroscopic C arm.  Once this was completed, the robotic bronchoscope ET tube adapter was placed and ETT was cut to proper length and secured  on the mid plane.  The [facility] robotic scope was then advanced through the ETT and registration was performed as best able.  Unfortunately because of the right middle lobe collapse the correlation between the robotic mapping and bronchoscopic mapping was not optimal.  Pathway to the target had to be done under direct visualization and with the assistance of fluoroscopy, the bronchoscope was advanced to the lateral RLL nodule/mass. The tip of the working channel was directed towards the lesion. Positioning was aided by augmented fluoroscopy.  At this point Cellvizio probe based confocal laser endomicroscopy (pCLE) was utilized to confirm target acquisition.  The images through [place] were consistent with inflammatory changes.   Augmented fluoroscopy via [employer] was utilized to optimize the position most favorable for biopsies, then the robotic bronchoscope was anchored to maintain position.  A total of 6 transbronchial biopsies were obtained at this point, the lesion could be seen moving during biopsies.  After confirmation of excellent hemostasis, 2 passes with a cytology brush on the RLL nodule/mass were performed, the brushes were cut into CytoLyt preservative.  A BAL was also obtained at this segment, which sent for cytology analysis.  For BAL sample acquisition purposes, 20 ml of normal saline were instilled, and approximately 8 ml were recovered/trapped and sent for analysis.   The robotic bronchoscope was then retracted all the way out after confirmation of excellent hemostasis.  The patient received bronchial lavage with 9 mL of 1% lidocaine via the ET tube. The patient tolerated the procedure well. No significant bleeding was observed at the conclusion of the procedure.  At this point, the patient was allowed to emerge from general  anesthesia, and was extubated in the procedure room without incident.  The patient  was taken to the PACU in satisfactory condition.  Auscultation of the lungs showed no change from pre bronchoscopy examination.  Patient tolerated the procedure well with no untoward effects of anesthesia noted.   Specimens Obtained:  Transbronchial Forceps Biopsy: X 6, RLL; X 4 RML  Transbronchial Brush: X 2, RLL  Targeted BAL: 8 mL aliquot, RLL  Fluoroscopy: Augmented fluoroscopy (Body Vision) was utilized during the course of this procedure to assure that biopsies were taken in a safe manner under fluoroscopic guidance with spot films required.  Total fluoroscopy time: 4 minutes 24 seconds, total dose 22.91 mGy.  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 Grayland Ormond, patient has appropriate pulmonary and oncology follow-ups     C. Derrill Kay, MD Advanced Bronchoscopy P[facility]    *This note was dictated using voice recognition software/Dragon.  Despite best efforts to proofread, errors can occur which can change the meaning.  Any change was purely unintentional.

## 2022-10-01 NOTE — Anesthesia Preprocedure Evaluation (Signed)
Anesthesia Evaluation  Patient identified by MRN, date of birth, ID band Patient awake    Reviewed: Allergy & Precautions, NPO status , Patient's Chart, lab work & pertinent test results  History of Anesthesia Complications Negative for: history of anesthetic complications  Airway Mallampati: III  TM Distance: <3 FB Neck ROM: full    Dental  (+) Chipped, Poor Dentition   Pulmonary shortness of breath and with exertion, pneumonia, unresolved   + rhonchi  + decreased breath sounds      Cardiovascular Exercise Tolerance: Good (-) angina (-) Past MI negative cardio ROS Normal cardiovascular exam     Neuro/Psych  Neuromuscular disease  negative psych ROS   GI/Hepatic Neg liver ROS,GERD  Controlled,,  Endo/Other  negative endocrine ROS    Renal/GU      Musculoskeletal   Abdominal   Peds  Hematology negative hematology ROS (+)   Anesthesia Other Findings Past Medical History: No date: Arthritis No date: Cataract     Comment:  bilateral No date: Chicken pox No date: Colon polyp 08/2016: Follicular lymphoma ([place])     Comment:  lymph nodes  No date: Hyperlipidemia 09/2022: Lung nodules     Comment:  right upper lobe No date: Osteoporosis No date: Rectal carcinoma ([place])  Past Surgical History: 08/21/2016: AXILLARY LYMPH NODE DISSECTION; Right     Comment:  Procedure: AXILLARY LYMPH NODE excision;  Surgeon:               Leonie Green, MD;  Location: [facility] ORS;  Service:               General;  Laterality: Right; No date: CATARACT EXTRACTION, BILATERAL; Bilateral 09/19/2020: COLONOSCOPY; N/A     Comment:  Procedure: COLONOSCOPY;  Surgeon: Lesly Rubenstein,               MD;  Location: [facility] ENDOSCOPY;  Service: Endoscopy;                Laterality: N/A; 09/16/2017: PORTA CATH INSERTION; N/A     Comment:  Procedure: PORTA CATH INSERTION;  Surgeon: Algernon Huxley,              MD;  Location: [facility];  Service:               Cardiovascular;  Laterality: N/A; No date: TONSILLECTOMY 1992: TOTAL HIP ARTHROPLASTY; Left     Reproductive/Obstetrics negative OB ROS                             Anesthesia Physical Anesthesia Plan  ASA: 3  Anesthesia Plan: General ETT   Post-op Pain Management:    Induction: Intravenous  PONV Risk Score and Plan: Ondansetron, Dexamethasone, Midazolam and Treatment may vary due to age or medical condition  Airway Management Planned: Oral ETT  Additional Equipment:   Intra-op Plan:   Post-operative Plan: Extubation in OR  Informed Consent: I have reviewed the patients History and Physical, chart, labs and discussed the procedure including the risks, benefits and alternatives for the proposed anesthesia with the patient or authorized representative who has indicated his/her understanding and acceptance.     Dental Advisory Given  Plan Discussed with: Anesthesiologist, CRNA and Surgeon  Anesthesia Plan Comments: (Patient consented for risks of anesthesia including but not limited to:  - adverse reactions to medications - damage to eyes, teeth, lips or other oral mucosa - nerve damage due to  positioning  - sore throat or hoarseness - Damage to heart, brain, nerves, lungs, other parts of body or loss of life  Patient voiced understanding.)       Anesthesia Quick Evaluation

## 2022-10-01 NOTE — Anesthesia Procedure Notes (Signed)
Procedure Name: Intubation Date/Time: 10/01/2022 12:59 PM  Performed by: Hedda Slade, CRNAPre-anesthesia Checklist: Patient identified, Patient being monitored, Timeout performed, Emergency Drugs available and Suction available Patient Re-evaluated:Patient Re-evaluated prior to induction Oxygen Delivery Method: Circle system utilized Preoxygenation: Pre-oxygenation with 100% oxygen Induction Type: IV induction Ventilation: Mask ventilation without difficulty Laryngoscope Size: 3 and McGraph Grade View: Grade I Tube type: Oral Tube size: 8.0 mm Number of attempts: 1 Airway Equipment and Method: Stylet Placement Confirmation: ETT inserted through vocal cords under direct vision, positive ETCO2 and breath sounds checked- equal and bilateral Secured at: 20 cm Tube secured with: Tape Dental Injury: Teeth and Oropharynx as per pre-operative assessment

## 2022-10-01 NOTE — Transfer of Care (Signed)
Immediate Anesthesia Transfer of Care Note  Patient: Veronica Boyd  Procedure(s) Performed: ROBOTIC ASSISTED NAVIGATIONAL BRONCHOSCOPY (Right)  Patient Location: PACU  Anesthesia Type:General  Level of Consciousness: awake, alert , and oriented  Airway & Oxygen Therapy: Patient Spontanous Breathing  Post-op Assessment: Report given to RN and Post -op Vital signs reviewed and stable  Post vital signs: Reviewed and stable  Last Vitals:  Vitals Value Taken Time  BP 119/66 10/01/22 1439  Temp    Pulse 97 10/01/22 1439  Resp 16 10/01/22 1439  SpO2 100 % 10/01/22 1439  Vitals shown include unvalidated device data.  Last Pain:  Vitals:   10/01/22 1121  TempSrc: Temporal  PainSc: 0-No pain         Complications: No notable events documented.

## 2022-10-02 LAB — CYTOLOGY - NON PAP

## 2022-10-02 LAB — SURGICAL PATHOLOGY

## 2022-10-02 NOTE — Anesthesia Postprocedure Evaluation (Signed)
Anesthesia Post Note  Patient: Veronica Boyd  Procedure(s) Performed: ROBOTIC ASSISTED NAVIGATIONAL BRONCHOSCOPY (Right)  Patient location during evaluation: PACU Anesthesia Type: General Level of consciousness: awake and alert Pain management: pain level controlled Vital Signs Assessment: post-procedure vital signs reviewed and stable Respiratory status: spontaneous breathing, nonlabored ventilation and respiratory function stable Cardiovascular status: blood pressure returned to baseline and stable Postop Assessment: no apparent nausea or vomiting Anesthetic complications: no   No notable events documented.   Last Vitals:  Vitals:   10/01/22 1515 10/01/22 1525  BP: (!) 110/55 118/65  Pulse: 84 96  Resp: 18 17  Temp: 36.9 C 37.1 C  SpO2: 95% 95%    Last Pain:  Vitals:   10/02/22 0944  TempSrc:   PainSc: 0-No pain                 [place] Ouch

## 2022-10-03 ENCOUNTER — Telehealth: Payer: Self-pay | Admitting: *Deleted

## 2022-10-03 ENCOUNTER — Other Ambulatory Visit: Payer: Self-pay

## 2022-10-03 DIAGNOSIS — C8208 Follicular lymphoma grade I, lymph nodes of multiple sites: Secondary | ICD-10-CM

## 2022-10-03 DIAGNOSIS — R918 Other nonspecific abnormal finding of lung field: Secondary | ICD-10-CM

## 2022-10-03 DIAGNOSIS — C20 Malignant neoplasm of rectum: Secondary | ICD-10-CM

## 2022-10-03 DIAGNOSIS — T17900A Unspecified foreign body in respiratory tract, part unspecified causing asphyxiation, initial encounter: Secondary | ICD-10-CM

## 2022-10-03 NOTE — Telephone Encounter (Signed)
Spoke to pt's daughter, Shirlean Mylar, to inform that PET scan scheduled for 11/30 has been cancelled at this time. Made aware that Dr. Grayland Ormond and Dr. Patsey Berthold recommend follow up CT scan in 4-6 weeks. Pt's daughter requests that her follow up appt with Dr. Grayland Ormond remain as scheduled for 12/4. Shirlean Mylar informed that she will be called with appt for CT scan once scheduled. Nothing further needed at this time.

## 2022-10-04 ENCOUNTER — Ambulatory Visit: Payer: 59

## 2022-10-05 ENCOUNTER — Ambulatory Visit: Payer: 59 | Admitting: Oncology

## 2022-10-05 DIAGNOSIS — R918 Other nonspecific abnormal finding of lung field: Secondary | ICD-10-CM

## 2022-10-05 DIAGNOSIS — J9809 Other diseases of bronchus, not elsewhere classified: Secondary | ICD-10-CM

## 2022-10-05 DIAGNOSIS — R911 Solitary pulmonary nodule: Secondary | ICD-10-CM

## 2022-10-08 ENCOUNTER — Encounter: Payer: Self-pay | Admitting: Oncology

## 2022-10-08 ENCOUNTER — Inpatient Hospital Stay: Payer: 59 | Attending: Hospice and Palliative Medicine | Admitting: Oncology

## 2022-10-08 VITALS — BP 126/72 | HR 77 | Temp 97.8°F | Resp 17 | Wt 91.4 lb

## 2022-10-08 DIAGNOSIS — R053 Chronic cough: Secondary | ICD-10-CM | POA: Diagnosis not present

## 2022-10-08 DIAGNOSIS — C7801 Secondary malignant neoplasm of right lung: Secondary | ICD-10-CM | POA: Insufficient documentation

## 2022-10-08 DIAGNOSIS — G629 Polyneuropathy, unspecified: Secondary | ICD-10-CM | POA: Insufficient documentation

## 2022-10-08 DIAGNOSIS — C8290 Follicular lymphoma, unspecified, unspecified site: Secondary | ICD-10-CM | POA: Insufficient documentation

## 2022-10-08 DIAGNOSIS — C20 Malignant neoplasm of rectum: Secondary | ICD-10-CM | POA: Insufficient documentation

## 2022-10-08 DIAGNOSIS — R918 Other nonspecific abnormal finding of lung field: Secondary | ICD-10-CM

## 2022-10-08 NOTE — Addendum Note (Signed)
Addended by: Delice Bison E on: 10/08/2022 11:42 AM   Modules accepted: Orders

## 2022-10-08 NOTE — Progress Notes (Signed)
[name]  [phone]) [phone] [phone]) [phone]  ID: Veronica Boyd OB: 16-Oct-1946  MR#: [id number]  BTD#:[id number]  Patient Care Team: Tracie Harrier, MD as PCP - General (Internal Medicine) Lloyd Huger, MD as Consulting Physician (Oncology) [name], MD as Referring Physician (Radiation Oncology) Jacquelin Hawking, NP as Nurse Practitioner (Oncology) Clent Jacks, RN as Oncology Nurse Navigator Tyler Pita, MD as Consulting Physician (Pulmonary Disease)  CHIEF COMPLAINT: Stage IV rectal cancer, history of follicular lymphoma.  INTERVAL HISTORY: Patient returns to clinic today for further evaluation and discussion of her biopsy results.  She has occasional shortness of breath, but otherwise feels well.  She does not complain of weakness or fatigue today.  She continues to have a chronic peripheral neuropathy.  She has no other neurologic complaints.  She denies any recent fevers or illnesses.  She denies any chest pain or hemoptysis.  She has a chronic cough.  She denies any nausea, vomiting, constipation or diarrhea.  She has no urinary complaints.  Patient offers no further specific complaints today.  REVIEW OF SYSTEMS:   Review of Systems  Constitutional: Negative.  Negative for fever, malaise/fatigue and weight loss.  Respiratory:  Positive for cough and shortness of breath. Negative for hemoptysis.   Cardiovascular: Negative.  Negative for chest pain and leg swelling.  Gastrointestinal:  Negative for abdominal pain, blood in stool, diarrhea, melena, nausea and vomiting.  Genitourinary: Negative.  Negative for dysuria.  Musculoskeletal: Negative.  Negative for back pain, joint pain and neck pain.  Skin: Negative.  Negative for rash.  Neurological:  Positive for tingling and sensory change. Negative for focal weakness, weakness and headaches.  Psychiatric/Behavioral: Negative.  The patient is not nervous/anxious and does not  have insomnia.     As per HPI. Otherwise, a complete review of systems is negative.   PAST MEDICAL HISTORY: Past Medical History:  Diagnosis Date   Arthritis    Cataract    bilateral   Chicken pox    Colon polyp    Follicular lymphoma ([place]) 08/2016   lymph nodes    Hyperlipidemia    Lung nodules 09/2022   right upper lobe   Osteoporosis    Rectal carcinoma ([place])     PAST SURGICAL HISTORY: Past Surgical History:  Procedure Laterality Date   AXILLARY LYMPH NODE DISSECTION Right 08/21/2016   Procedure: AXILLARY LYMPH NODE excision;  Surgeon: Leonie Green, MD;  Location: [facility] ORS;  Service: General;  Laterality: Right;   CATARACT EXTRACTION, BILATERAL Bilateral    COLONOSCOPY N/A 09/19/2020   Procedure: COLONOSCOPY;  Surgeon: Lesly Rubenstein, MD;  Location: [facility] END[facility];  Service: Endoscopy;  Laterality: N/A;   PORTA CATH INSERTION N/A 09/16/2017   Procedure: PORTA CATH INSERTION;  Surgeon: Algernon Huxley, MD;  Location: [facility];  Service: Cardiovascular;  Laterality: N/A;   TONSILLECTOMY     TOTAL HIP ARTHROPLASTY Left 1992    FAMILY HISTORY: Family History  Problem Relation Age of Onset   Diabetes Sister    Lung cancer Brother    Diabetes Brother    Basal cell carcinoma Daughter    Breast cancer Paternal Aunt     ADVANCED DIRECTIVES (Y/N):  N  HEALTH MAINTENANCE: Social History   Tobacco Use   Smoking status: Never   Smokeless tobacco: Never  Vaping Use   Vaping Use: Never used  Substance Use Topics   Alcohol use: No   Drug use: No  Colonoscopy:  PAP:  Bone density:  Lipid panel:  No Known Allergies  Current Outpatient Medications  Medication Sig Dispense Refill   diclofenac sodium (VOLTAREN) 1 % GEL Apply 2 g topically 2 (two) times daily as needed.     diphenoxylate-atropine (LOMOTIL) 2.5-0.025 MG tablet TAKE 2 TABLETS BY MOUTH 4 (FOUR) TIMES DAILY AS NEEDED FOR DIARRHEA OR LOOSE STOOLS. 60 tablet 1   gabapentin  (NEURONTIN) 300 MG capsule TAKE 1 CAPSULE BY MOUTH TWICE A DAY (Patient taking differently: Take 600 mg by mouth at bedtime.) 60 capsule 2   pantoprazole (PROTONIX) 20 MG tablet Take 1 tablet by mouth daily as needed.     simvastatin (ZOCOR) 20 MG tablet Take 20 mg by mouth at bedtime.     tiZANidine (ZANAFLEX) 2 MG tablet TAKE 1 TABLET BY MOUTH NIGHTLY AS NEEDED FOR MUSCLE SPASMS. 90 tablet 1   traMADol-acetaminophen (ULTRACET) 37.5-325 MG tablet Take 1 tablet by mouth every 8 (eight) hours as needed.     No current facility-administered medications for this visit.   Facility-Administered Medications Ordered in Other Visits  Medication Dose Route Frequency Provider Last Rate Last Admin   heparin lock flush 100 unit/mL  500 Units Intravenous Once Grayland Ormond, Kathlene November, MD       heparin lock flush 100 unit/mL  500 Units Intracatheter PRN Grayland Ormond, Kathlene November, MD       heparin lock flush 100 unit/mL  500 Units Intravenous Once Lloyd Huger, MD       heparin lock flush 100 unit/mL  500 Units Intravenous Once Lloyd Huger, MD       sodium chloride flush (NS) 0.9 % injection 10 mL  10 mL Intravenous PRN Lloyd Huger, MD   10 mL at 12/04/17 0901   sodium chloride flush (NS) 0.9 % injection 10 mL  10 mL Intracatheter PRN Lloyd Huger, MD       sodium chloride flush (NS) 0.9 % injection 10 mL  10 mL Intravenous PRN Lloyd Huger, MD   10 mL at 04/24/21 0839   yttrium-90 injection 98.1 millicurie  19.1 millicurie Intravenous Once Chrystal, Eulas Post, MD        OBJECTIVE: Vitals:   10/08/22 1032  BP: 126/72  Pulse: 77  Resp: 17  Temp: 97.8 F (36.6 C)  SpO2: 95%      Body mass index is 19.1 kg/m.    ECOG FS:0 - Asymptomatic  General: Well-developed, well-nourished, no acute distress. Eyes: Pink conjunctiva, anicteric sclera. HEENT: Normocephalic, moist mucous membranes. Lungs: No audible wheezing or coughing. Heart: Regular rate and rhythm. Abdomen: Soft,  nontender, no obvious distention. Musculoskeletal: No edema, cyanosis, or clubbing. Neuro: Alert, answering all questions appropriately. Cranial nerves grossly intact. Skin: No rashes or petechiae noted. Psych: Normal affect.  LAB RESULTS:  Lab Results  Component Value Date   NA 139 09/05/2022   K 4.0 09/05/2022   CL 106 09/05/2022   CO2 27 09/05/2022   GLUCOSE 77 09/05/2022   BUN 12 09/05/2022   CREATININE 0.71 09/05/2022   CALCIUM 8.9 09/05/2022   PROT 6.8 09/05/2022   ALBUMIN 3.6 09/05/2022   AST 20 09/05/2022   ALT 11 09/05/2022   ALKPHOS 68 09/05/2022   BILITOT 0.6 09/05/2022   GFRNONAA >60 09/05/2022   GFRAA >60 06/28/2020    Lab Results  Component Value Date   WBC 6.9 09/05/2022   NEUTROABS 4.1 09/05/2022   HGB 13.5 09/05/2022   HCT 42.4 09/05/2022  MCV 97.0 09/05/2022   PLT 201 09/05/2022     STUDIES: DG Chest Port 1 View  Result Date: 10/01/2022 CLINICAL DATA:  Status post bronchoscopy EXAM: PORTABLE CHEST 1 VIEW COMPARISON:  Chest CT 09/24/2022 FINDINGS: Chest port catheter tip overlies the distal SVC. Unchanged cardiomediastinal silhouette. Unchanged right perihilar opacity. Mild interstitial opacities and thickening of the minor fissure. IMPRESSION: Diffuse interstitial opacities with thickening of the minor fissure, could reflect interstitial edema. Unchanged right perihilar opacity and right lower lobe pulmonary nodule. Electronically Signed   By: Maurine Simmering M.D.   On: 10/01/2022 15:50   DG C-Arm 1-60 Min-No Report  Result Date: 10/01/2022 Fluoroscopy was utilized by the requesting physician.  No radiographic interpretation.   CT SUPER D CHEST WO MONARCH PILOT  Result Date: 09/24/2022 CLINICAL DATA:  Non-small cell lung cancer. Evaluate lung nodules. * Tracking Code: BO * EXAM: CT CHEST WITHOUT CONTRAST TECHNIQUE: Multidetector CT imaging of the chest was performed using thin slice collimation for electromagnetic bronchoscopy planning purposes,  without intravenous contrast. RADIATION DOSE REDUCTION: This exam was performed according to the departmental dose-optimization program which includes automated exposure control, adjustment of the mA and/or kV according to patient size and/or use of iterative reconstruction technique. COMPARISON:  09/12/2022 FINDINGS: Cardiovascular: Upper limits of normal heart size. No pericardial effusion. Aortic atherosclerosis. Coronary artery calcifications. Mediastinum/Nodes: Thyroid gland, trachea and esophagus are unremarkable. No enlarged axillary or mediastinal lymph nodes. Lungs/Pleura: Radiation change within the right upper and right lower lobe appear unchanged from the previous exam. Since the previous exam there has been complete atelectasis of the right middle lobe occlusion of the right middle lobe bronchus is identified, image 98/7 which may reflect underlying mucous plugging versus locally recurrent tumor. -Lateral right lower lobe lung nodule measures 1.6 x 1.3 cm, image 95/3. Previously 1.6 x 1.3 cm. -Medial right lower lobe lung nodule measures 1.0 x 1.0 cm, image 97/3. Previously 1.0 x 0.6 cm. -Anteromedial right lung nodule measures 1.0 x 1.0 cm, image 93/3. Previously 1.0 x 1.0 cm. Upper Abdomen: No acute findings. Musculoskeletal: No chest wall mass or suspicious bone lesions identified. IMPRESSION: 1. Interval complete atelectasis of the right middle lobe. There is occlusion of the right middle lobe bronchus which may reflect underlying mucous plugging versus locally recurrent tumor. 2. Three nodules are again noted within the right lower lobe which appears similar in size to the previous exam and remain worrisome for either locally recurrent tumor or metastatic disease. 3. Coronary artery calcifications. 4.  Aortic Atherosclerosis (ICD10-I70.0). Electronically Signed   By: Kerby Moors M.D.   On: 09/24/2022 08:38   CT Chest W Contrast  Result Date: 09/14/2022 CLINICAL DATA:  Non-small cell lung  cancer, metastatic, assess treatment response. Radiation therapy and chemotherapy in July 2023. Right lower lobe nodule treated with radiation October 2022. Severe cough. * Tracking Code: BO * EXAM: CT CHEST WITH CONTRAST TECHNIQUE: Multidetector CT imaging of the chest was performed during intravenous contrast administration. RADIATION DOSE REDUCTION: This exam was performed according to the departmental dose-optimization program which includes automated exposure control, adjustment of the mA and/or kV according to patient size and/or use of iterative reconstruction technique. CONTRAST:  11m OMNIPAQUE IOHEXOL 300 MG/ML  SOLN COMPARISON:  PET 04/04/2022 and CT chest 06/01/2022. FINDINGS: Cardiovascular: Right IJ Port-A-Cath terminates at the SVC RA junction. Atherosclerotic calcification of the aorta, aortic valve and coronary arteries. Heart size is at the upper limits of normal. No pericardial effusion. Mediastinum/Nodes: No pathologically enlarged mediastinal,  hilar or axillary lymph nodes. Esophagus is grossly unremarkable. Lungs/Pleura: Areas of post treatment scarring in the right upper and right lower lobes. New and enlarging nodules in the anterior right lower lobe. Lateral right lower lobe nodule measures 1.3 x 1.6 cm (3/102), previously 6 x 8 mm. Medial 6 x 10 mm nodule in the right lower lobe (3/102) is new. Trace right pleural fluid. There is a new metallic linear structure at the bifurcation of the right middle lobe bronchus (3/72), with narrowing of the distal right middle lobe bronchus and mucoid impaction involving the medial segmental bronchus. Airway is otherwise unremarkable. Upper Abdomen: Visualized portions of the liver, gallbladder, adrenal glands, kidneys, spleen, pancreas, stomach and bowel are grossly unremarkable. No upper abdominal adenopathy. Musculoskeletal: Degenerative changes in the spine. No worrisome lytic or sclerotic lesions. IMPRESSION: 1. New/enlarging right lower lobe  nodules, due to either recurrent lung cancer or metastatic rectal cancer. 2. New linear metallic structure at the bifurcation of the right middle lobe bronchus with narrowing of the distal right middle lobe bronchus and mucoid impaction in the medial segmental bronchus. Please correlate clinically. An intervening procedural note is not identified in Epic. 3. Post treatment changes throughout the right lung, stable. 4. Trace right pleural fluid. 5. Aortic atherosclerosis (ICD10-I70.0). Coronary artery calcification. Electronically Signed   By: Lorin Picket M.D.   On: 09/14/2022 10:50    ONCOLOGY HISTORY:  Biopsy confirmed rectal cancer.  MRI completed at [facility] on October 11, 2020 revealed extension of the tumor beyond the wall into the rectovaginal recess with suspected invasion of the vaginal wall posteriorly.  Tumor also appears to involve the internal anal sphincter.  There are also numerous prominent presacral and mesorectal lymph nodes highly suspicious for malignancy.  PET scan results from November 17, 2020 reviewed independently with 3 hypermetabolic right lower lobe lung nodules consistent with pulmonary metastasis.  Patient completed cycle 8 of FOLFOX on Mar 29, 2021.  She then completed cycle 5 of her 5-FU pump on May 26, 2021 and XRT on June 01, 2021.  PET scan results from July 17, 2021 reviewed independently with no evidence of disease other than enlarging right lower lobe lung lesion.  Patient completed SBRT to the right lung lesion on August 22, 2021.  Repeat PET scan on July 25, 2021 revealed minimal residual disease in the rectum and likely resolution of lesions in the lung.    ASSESSMENT: Stage IV rectal cancer, history of follicular lymphoma. PLAN:    1.  Stage IV rectal cancer: See oncology history as above.  CEA is within normal limits.  Recent CT scan and PET scan results noted.  Bronchoscopy and biopsy only revealed inflammatory tissue with collapsed lung which  could possibly give a false positive on PET scan.  No malignancy was identified.  Plan to repeat CT of the chest in mid January to assess for interval changes.  Follow-up 2 to 3 days after CT scan.  Case discussed with pulmonary. 2.  Pulmonary nodule: Imaging and biopsy results as above.  No malignancy identified.  Repeat CT scan in mid January. 3. Recurrent follicular lymphoma: CT scan results from December 23, 2019 reviewed independently with no obvious evidence of recurrent or progressive disease.  PET scan results as above consistent with rectal cancer metastasis and no evidence of lymphoma.   Patient received Zevalin on July 30, 2017 with not much therapeutic effect.  She subsequently underwent cycles 5 of R-CHOP chemotherapy with Neulasta support completing on December 12, 2017.  Patient continues to be in complete remission.  4.  Neuropathy: Chronic and unchanged. 5.  Possible aspiration: Patient has a swallow study in the near future.  I spent a total of 30 minutes reviewing chart data, face-to-face evaluation with the patient, counseling and coordination of care as detailed above.  Patient expressed understanding and was in agreement with this plan. She also understands that She can call clinic at any time with any questions, concerns, or complaints.    Cancer Staging  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 [facility]) Staging form: Lymphoid Neoplasms, AJCC 6th Edition - Clinical stage from 08/27/2016: Stage IIE - Signed by Lloyd Huger, MD on 08/27/2016  Rectal cancer [facility]) Staging form: Colon and Rectum, AJCC 8th Edition - Clinical: Stage IVA Larwance Sachs, Noelle.Marry) - Signed by Lloyd Huger, MD on 11/02/2020   Lloyd Huger, MD   10/08/2022 11:13 AM

## 2022-10-08 NOTE — Progress Notes (Signed)
Pt here for biopsy results of lung nodules. Feeling at baseline.

## 2022-10-19 ENCOUNTER — Encounter: Payer: Self-pay | Admitting: Pulmonary Disease

## 2022-10-19 ENCOUNTER — Ambulatory Visit (INDEPENDENT_AMBULATORY_CARE_PROVIDER_SITE_OTHER): Payer: 59 | Admitting: Pulmonary Disease

## 2022-10-19 VITALS — BP 110/70 | HR 93 | Temp 97.5°F | Ht <= 58 in | Wt 88.6 lb

## 2022-10-19 DIAGNOSIS — R918 Other nonspecific abnormal finding of lung field: Secondary | ICD-10-CM

## 2022-10-19 DIAGNOSIS — J9809 Other diseases of bronchus, not elsewhere classified: Secondary | ICD-10-CM

## 2022-10-19 DIAGNOSIS — T17900D Unspecified foreign body in respiratory tract, part unspecified causing asphyxiation, subsequent encounter: Secondary | ICD-10-CM

## 2022-10-19 DIAGNOSIS — K089 Disorder of teeth and supporting structures, unspecified: Secondary | ICD-10-CM | POA: Diagnosis not present

## 2022-10-19 NOTE — Patient Instructions (Signed)
Your CT scan will be done on 10 January, we will see him in follow-up after that.  Please call sooner should any new problems arise.

## 2022-10-19 NOTE — Progress Notes (Signed)
Subjective:    Patient ID: Veronica Boyd, female    DOB: 02-Feb-1946, 76 y.o.   MRN: [id number] Patient Care Team: Tracie Harrier, MD as PCP - General (Internal Medicine) Lloyd Huger, MD as Consulting Physician (Oncology) [name], MD as Referring Physician (Radiation Oncology) Jacquelin Hawking, NP as Nurse Practitioner (Oncology) Clent Jacks, RN as Oncology Nurse Navigator Tyler Pita, MD as Consulting Physician (Pulmonary Disease)  Chief Complaint  Patient presents with   Follow-up    No SOB or wheezing. Dry cough. Feels like she can not take a deep breath.   HPI Sury Wentworth is a 76 year old lifelong never smoker who follows up for pulmonary nodules in the setting of stage IV rectal cancer and a history of follicular lymphoma.  Her primary oncologist is Dr. Delight Hoh.  The patient had undergone SBRT x 2 to her right lower lobe and right midlung due to what where apparent metastatic deposits.  She has also previously completed concurrent chemoradiation therapy as well as FOLFOX for locally advanced rectal cancer.  The most recent follow-up CT performed earlier this month showed that she had 3 enlarging nodules on the right lower lobe that are concerning for potential metastatic rectal cancer.  The patient underwent bronchoscopy on 01 October 2022 this showed that the patient had total occlusion of the right middle lobe medial and lateral (B4/B5) subsegments with gelatinous inflammatory debris, the formal pathology revealed intense inflammatory change and calcifications.  Of note the patient had had issues with fractured teeth and there is a concern for aspiration.  Since her bronchoscopy she has had marked improvement on her cough.  Occasionally difficulty taking a deep breath but this is unchanged from prior.  He is to have a swallow eval on 27 December. She has not had any hemoptysis.  She has not had any fevers, chills or sweats.  No chest pain.  No dyspnea per se.   Her prior visit she has had 12 teeth extracted.  She is to get partials.  Having some difficulty eating meals but this is getting better after she had her teeth extracted.   Does not endorse any other symptoms.   Review of Systems A 10 point review of systems was performed and it is as noted above otherwise negative.  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   Rectal cancer ([place]) 11/02/2020   Rectal mass 10/11/2020   Gastroesophageal reflux disease without esophagitis 08/25/2018   Protein-calorie malnutrition, severe 12/20/2017   Anemia    Thrombocytopenia ([facility])    Sepsis ([place]) 12/19/2017   Anemia due to antineoplastic chemotherapy 12/17/2017   Goals of care, counseling/discussion 09/19/2017   Axillary mass, right 04/03/2017   Pure hypercholesterolemia 09/10/2016   Postmenopausal osteoporosis 60/73/7106   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 ([place]) 07/24/2016   Mass of right chest wall 06/06/2016   Primary osteoarthritis involving multiple joints 12/14/2015   Transient insomnia 08/27/2015   Mixed hyperlipidemia 03/01/2015   Nocturnal muscle cramps 03/01/2015   Osteoporosis 11/08/2014   Primary osteoarthritis of both knees 07/05/2014   Hyperlipidemia 03/19/2014   Osteoarthritis of knee 03/19/2014   Abnormal mammogram 03/03/2013   Vitamin D deficiency 05/09/2012   Social History   Tobacco Use   Smoking status: Never   Smokeless tobacco: Never  Substance Use Topics   Alcohol use: No   No  Known Allergies  Current Meds  Medication Sig   diclofenac sodium (VOLTAREN) 1 % GEL Apply 2 g topically 2 (two) times daily as needed.   diphenoxylate-atropine (LOMOTIL) 2.5-0.025 MG tablet TAKE 2 TABLETS BY MOUTH 4 (FOUR) TIMES DAILY AS NEEDED FOR DIARRHEA OR LOOSE STOOLS.   gabapentin (NEURONTIN) 300 MG capsule TAKE  1 CAPSULE BY MOUTH TWICE A DAY (Patient taking differently: Take 600 mg by mouth at bedtime.)   pantoprazole (PROTONIX) 20 MG tablet Take 1 tablet by mouth daily as needed.   simvastatin (ZOCOR) 20 MG tablet Take 20 mg by mouth at bedtime.   tiZANidine (ZANAFLEX) 2 MG tablet TAKE 1 TABLET BY MOUTH NIGHTLY AS NEEDED FOR MUSCLE SPASMS.   traMADol-acetaminophen (ULTRACET) 37.5-325 MG tablet Take 1 tablet by mouth every 8 (eight) hours as needed.   Immunization History  Administered Date(s) Administered   Influenza-Unspecified 09/20/2014   PFIZER(Purple Top)SARS-COV-2 Vaccination 01/07/2020, 02/02/2020   Unspecified SARS-COV-2 Vaccination 02/02/2020   Zoster Recombinat (Shingrix) 11/29/2019, 03/20/2020   Zoster, Live 09/20/2014      Objective:   Physical Exam BP 110/70 (BP Location: Left Arm, Cuff Size: Normal)   Pulse 93   Temp (!) 97.5 F (36.4 C)   Ht '4\' 10"'$  (1.473 m)   Wt 88 lb 9.6 oz (40.2 kg)   SpO2 95%   BMI 18.52 kg/m  GENERAL: Thin, elderly female, pale, fully ambulatory.  No conversational dyspnea. HEAD: Normocephalic, atraumatic.  EYES: Pupils equal, round, reactive to light.  No scleral icterus.  MOUTH: Nose/mouth/throat not examined due to institutional masking requirements. NECK: Supple. No thyromegaly. Trachea midline. No JVD.  No adenopathy. PULMONARY: Good air entry bilaterally.  No adventitious sounds. CARDIOVASCULAR: S1 and S2. Regular rate and rhythm.  No rubs, murmurs or gallops heard. ABDOMEN: Scaphoid otherwise benign. MUSCULOSKELETAL: No joint deformity, no clubbing, no edema.  NEUROLOGIC: Grossly nonfocal.  Fully ambulatory.  Speech is fluent. SKIN: Intact,warm,dry. PSYCH: Mood and behavior normal.      Assessment & Plan:     ICD-10-CM   1. Bronchial obstruction  J98.09    Status post debridement 27 November Follow-up chest CT 10 January    2. Pulmonary aspiration, subsequent encounter  T17.900D    Suspect aspiration of fractured teeth/gastric  contents    3. Lung nodules  R91.8    Will have repeat CT chest 10 January Will seeher in follow-up after that    4. Poor dentition  K08.9    She has had 12 teeth extracted Query prior aspiration of fractured teeth     Follow-up after chest CT on 10 January.  Patient is to contact us prior to that time should any new difficulties arise.  Renold Don, MD Advanced Bronchoscopy PCCM [facility]    *This note was dictated using voice recognition software/Dragon.  Despite best efforts to proofread, errors can occur which can change the meaning. Any transcriptional errors that result from this process are unintentional and may not be fully corrected at the time of dictation.

## 2022-10-22 ENCOUNTER — Ambulatory Visit: Payer: 59 | Attending: Radiation Oncology | Admitting: Radiation Oncology

## 2022-10-22 DIAGNOSIS — R911 Solitary pulmonary nodule: Secondary | ICD-10-CM | POA: Insufficient documentation

## 2022-10-30 ENCOUNTER — Other Ambulatory Visit: Payer: Self-pay | Admitting: Oncology

## 2022-10-31 ENCOUNTER — Ambulatory Visit
Admission: RE | Admit: 2022-10-31 | Discharge: 2022-10-31 | Disposition: A | Discharge status: Home / Self Care | Payer: 59 | Source: Ambulatory Visit | Attending: Pulmonary Disease | Admitting: Pulmonary Disease

## 2022-10-31 DIAGNOSIS — X58XXXA Exposure to other specified factors, initial encounter: Secondary | ICD-10-CM | POA: Diagnosis not present

## 2022-10-31 DIAGNOSIS — T17900A Unspecified foreign body in respiratory tract, part unspecified causing asphyxiation, initial encounter: Secondary | ICD-10-CM | POA: Diagnosis present

## 2022-10-31 DIAGNOSIS — R1310 Dysphagia, unspecified: Secondary | ICD-10-CM | POA: Insufficient documentation

## 2022-10-31 NOTE — Progress Notes (Signed)
Modified Barium Swallow Progress Note  Patient Details  Name: Veronica Boyd MRN: [id number] Date of Birth: Mar 03, 1946  Today's Date: 10/31/2022  Modified Barium Swallow completed.  Full report located under Chart Review in the Imaging Section.  Brief recommendations include the following:  HPI: Patient is a 76 y.o. 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   Clinical Impression  Patient presents with functional oropharyngeal swallowing. There was no laryngeal penetration or tracheal aspiration observed on this exam. Missing dentition impacts mastication of regular solid, however otherwise oral containment, bolus control and transfer are within normal limits. Pharyngeal phase is characterized by adequate hyolaryngeal excursion, pharyngeal constriction, and tongue base retraction. Epiglottic deflection is complete. With incomplete mastication of solid, single piece of solid residue was retained along the posterior pharyngeal wall, which cleared completely with second swallow. Patient reports self-selecting softer foods due to difficulty chewing; she is presently awaiting dentures. Pharyngosesophageal phase of swallowing was unremarkable, with adequate clearance of all consistencies presented. Recommend pt continue with self-selected softer foods, thin liquids, meds  whole with puree.   Swallow Evaluation Recommendations       SLP Diet Recommendations: Dysphagia 3 (Mech soft) solids;Thin liquid   Liquid Administration via: Cup;Straw   Medication Administration: Whole meds with liquid   Supervision: Patient able to self feed   Compensations: Slow rate;Small sips/bites   Postural Changes: Remain semi-upright after after feeds/meals (Comment)   Oral Care Recommendations: Oral care BID      Deneise Lever, MS, CCC-SLP Speech-Language Pathologist [phone]   Aliene Altes 10/31/2022,2:21 PM

## 2022-10-31 NOTE — Addendum Note (Signed)
Encounter addended by: Aliene Altes, CCC-SLP on: 10/31/2022 2:25 PM  Actions taken: Flowsheet accepted, Charge Capture section accepted

## 2022-11-07 ENCOUNTER — Encounter: Payer: Self-pay | Admitting: Oncology

## 2022-11-12 ENCOUNTER — Encounter: Payer: Self-pay | Admitting: Oncology

## 2022-11-12 ENCOUNTER — Telehealth: Payer: Self-pay | Admitting: *Deleted

## 2022-11-12 NOTE — Telephone Encounter (Signed)
Veronica Boyd called reporting that patient is having increased shortness of breath and Pulmonology cannot see her until 2/6 so she is asking if Dr Grayland Ormond can see her. She is scheduled for a CT 11/14/22 and follow up with Dr Grayland Ormond 11/21/22. I see also that she has follow up with Dr Patsey Berthold 1/16. Please advise

## 2022-11-13 ENCOUNTER — Ambulatory Visit
Admission: RE | Admit: 2022-11-13 | Discharge: 2022-11-13 | Disposition: A | Discharge status: Home / Self Care | Payer: 59 | Source: Ambulatory Visit

## 2022-11-13 ENCOUNTER — Encounter: Payer: Self-pay | Admitting: Oncology

## 2022-11-13 ENCOUNTER — Ambulatory Visit
Admission: RE | Admit: 2022-11-13 | Discharge: 2022-11-13 | Disposition: A | Discharge status: Home / Self Care | Payer: 59 | Source: Ambulatory Visit | Attending: Hospice and Palliative Medicine | Admitting: Hospice and Palliative Medicine

## 2022-11-13 ENCOUNTER — Encounter: Payer: Self-pay | Admitting: Hospice and Palliative Medicine

## 2022-11-13 ENCOUNTER — Inpatient Hospital Stay: Payer: 59

## 2022-11-13 ENCOUNTER — Inpatient Hospital Stay: Payer: 59 | Attending: Hospice and Palliative Medicine | Admitting: Hospice and Palliative Medicine

## 2022-11-13 ENCOUNTER — Other Ambulatory Visit: Payer: Self-pay

## 2022-11-13 ENCOUNTER — Other Ambulatory Visit: Payer: Self-pay | Admitting: *Deleted

## 2022-11-13 VITALS — BP 106/69 | HR 116 | Temp 97.0°F | Resp 18

## 2022-11-13 DIAGNOSIS — C20 Malignant neoplasm of rectum: Secondary | ICD-10-CM

## 2022-11-13 DIAGNOSIS — C7801 Secondary malignant neoplasm of right lung: Secondary | ICD-10-CM | POA: Insufficient documentation

## 2022-11-13 DIAGNOSIS — R0602 Shortness of breath: Secondary | ICD-10-CM

## 2022-11-13 DIAGNOSIS — R918 Other nonspecific abnormal finding of lung field: Secondary | ICD-10-CM

## 2022-11-13 DIAGNOSIS — G629 Polyneuropathy, unspecified: Secondary | ICD-10-CM | POA: Diagnosis not present

## 2022-11-13 DIAGNOSIS — R0609 Other forms of dyspnea: Secondary | ICD-10-CM | POA: Insufficient documentation

## 2022-11-13 DIAGNOSIS — C8200 Follicular lymphoma grade I, unspecified site: Secondary | ICD-10-CM | POA: Insufficient documentation

## 2022-11-13 LAB — COMPREHENSIVE METABOLIC PANEL
ALT: 12 U/L (ref 0–44)
AST: 24 U/L (ref 15–41)
Albumin: 3.6 g/dL (ref 3.5–5.0)
Alkaline Phosphatase: 58 U/L (ref 38–126)
Anion gap: 10 (ref 5–15)
BUN: 12 mg/dL (ref 8–23)
CO2: 26 mmol/L (ref 22–32)
Calcium: 8.5 mg/dL — ABNORMAL LOW (ref 8.9–10.3)
Chloride: 102 mmol/L (ref 98–111)
Creatinine, Ser: 0.68 mg/dL (ref 0.44–1.00)
GFR, Estimated: >60 mL/min (ref >60)
Glucose, Bld: 94 mg/dL (ref 70–99)
Potassium: 3.2 mmol/L — ABNORMAL LOW (ref 3.5–5.1)
Sodium: 138 mmol/L (ref 135–145)
Total Bilirubin: 0.5 mg/dL (ref 0.3–1.2)
Total Protein: 6 g/dL — ABNORMAL LOW (ref 6.5–8.1)

## 2022-11-13 LAB — CBC WITH DIFFERENTIAL/PLATELET
Abs Immature Granulocytes: 0.01 10*3/uL (ref 0.00–0.07)
Basophils Absolute: 0 10*3/uL (ref 0.0–0.1)
Basophils Relative: 1 %
Eosinophils Absolute: 0.1 10*3/uL (ref 0.0–0.5)
Eosinophils Relative: 1 %
HCT: 40.6 % (ref 36.0–46.0)
Hemoglobin: 13.6 g/dL (ref 12.0–15.0)
Immature Granulocytes: 0 %
Lymphocytes Relative: 28 %
Lymphs Abs: 1.5 10*3/uL (ref 0.7–4.0)
MCH: 30.8 pg (ref 26.0–34.0)
MCHC: 33.5 g/dL (ref 30.0–36.0)
MCV: 91.9 fL (ref 80.0–100.0)
Monocytes Absolute: 0.9 10*3/uL (ref 0.1–1.0)
Monocytes Relative: 15 %
Neutro Abs: 3.1 10*3/uL (ref 1.7–7.7)
Neutrophils Relative %: 55 %
Platelets: 198 10*3/uL (ref 150–400)
RBC: 4.42 MIL/uL (ref 3.87–5.11)
RDW: 13.8 % (ref 11.5–15.5)
WBC: 5.6 10*3/uL (ref 4.0–10.5)
nRBC: 0 % (ref 0.0–0.2)

## 2022-11-13 LAB — MAGNESIUM: Magnesium: 2.1 mg/dL (ref 1.7–2.4)

## 2022-11-13 LAB — BRAIN NATRIURETIC PEPTIDE: B Natriuretic Peptide: 67.2 pg/mL (ref 0.0–100.0)

## 2022-11-13 MED ORDER — POTASSIUM CHLORIDE CRYS ER 10 MEQ PO TBCR: 10.0000 meq | EXTENDED_RELEASE_TABLET | Freq: Every day | ORAL | 0 refills | Status: DC

## 2022-11-13 MED ORDER — IOHEXOL 300 MG/ML  SOLN
60.0000 mL | Freq: Once | INTRAMUSCULAR | Status: AC | PRN
  Administered 2022-11-13: 60 mL via INTRAVENOUS

## 2022-11-13 NOTE — Telephone Encounter (Signed)
Of note: pt's ct is scheduled tomorrow.

## 2022-11-13 NOTE — Progress Notes (Signed)
Symptom Management [place] at [facility] [phone] [phone]  Patient Care Team: Tracie Harrier, MD as PCP - General (Internal Medicine) Lloyd Huger, MD as Consulting Physician (Oncology) [name], MD as Referring Physician (Radiation Oncology) Jacquelin Hawking, NP as Nurse Practitioner (Oncology) Clent Jacks, RN as Oncology Nurse Navigator Tyler Pita, MD as Consulting Physician (Pulmonary Disease)   Name of the patient: Veronica Boyd  [id number]  09/03/1946   Date of visit: 11/13/22  Reason for Consult: Veronica Boyd is a 77 y.o. female with multiple medical problems including stage IV rectal cancer, history of follicular lymphoma.  Patient is status post chemotherapy and RT.  Plan was for surgical evaluation but patient was found to have disease progression with pulmonary nodules.  Patient ultimately opted for surveillance.  Interval history: Patient last saw Dr. Grayland Ormond on 10/08/2022 with plan to repeat CT to assess for interval changes.  Patient has history of bronchial obstruction with possible aspiration status post bronchoscopy on 10/01/2022.  Patient had modified barium swallow study on 10/31/2022 without any observed laryngeal penetration or tracheal aspiration on exam.  Patient presents to [facility] today for evaluation of shortness of breath.  Patient reports that she has had intermittent shortness of breath since her bronchoscopy.  However, mostly shortness of breath is with prolonged exertion.  Yesterday, patient says that she was more short of breath than usual.  However, today she feels back to baseline.  She denies fever or chills.  She has occasional dry cough but no rhinorrhea or congestion.  She denies wheezing.  No chest pain or pressure.  No feeling of tachycardia.  Denies any neurologic complaints. Denies recent fevers or illnesses. Denies any easy bleeding or bruising. Denies  chest pain. Denies any constipation, or diarrhea. Denies urinary complaints. Patient offers no further specific complaints today.    PAST MEDICAL HISTORY: Past Medical History:  Diagnosis Date   Arthritis    Cataract    bilateral   Chicken pox    Colon polyp    Follicular lymphoma ([place]) 08/2016   lymph nodes    Hyperlipidemia    Lung nodules 09/2022   right upper lobe   Osteoporosis    Rectal carcinoma ([place])     PAST SURGICAL HISTORY:  Past Surgical History:  Procedure Laterality Date   AXILLARY LYMPH NODE DISSECTION Right 08/21/2016   Procedure: AXILLARY LYMPH NODE excision;  Surgeon: Leonie Green, MD;  Location: [facility] ORS;  Service: General;  Laterality: Right;   CATARACT EXTRACTION, BILATERAL Bilateral    COLONOSCOPY N/A 09/19/2020   Procedure: COLONOSCOPY;  Surgeon: Lesly Rubenstein, MD;  Location: [facility] ENDOSCOPY;  Service: Endoscopy;  Laterality: N/A;   PORTA CATH INSERTION N/A 09/16/2017   Procedure: PORTA CATH INSERTION;  Surgeon: Algernon Huxley, MD;  Location: [place] [facility] LAB;  Service: Cardiovascular;  Laterality: N/A;   TONSILLECTOMY     TOTAL HIP ARTHROPLASTY Left 1992    HEMATOLOGY/ONCOLOGY HISTORY:  Oncology History Overview Note  Initial diagnosis 2014. S/p radiation 3 years ago.  Noted increase lymphadenopathy and biopsy was recommended.  Unfortunately she refused and was lost to follow-up. Return to clinic in September 2017 with increased swelling surrounding her right collarbone.  Had biopsy that was suggestive of follicular lymphoma but not definitive.  Had PET scan to complete the staging work-up.  PET scan showed hypermetabolic adenopathy in bilateral subpectoral and axillary regions, right internal mammary chain and right paratracheal and subclavicular  regions.  There is no evidence of hypermetabolic lymphadenopathy within the abdomen or pelvis.  Received Zevalin on July 30, 2017 with not much therapeutic effect.  She subsequently  underwent cycles 5 of R CHOP chemotherapy with Neulasta support completing on December 12, 2017.  No further intervention or treatments are needed at this time.  PET scan results from February 19, 2018 reviewed independently and reported as above with no suspicious finding for active lymphoma.      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  MEDICATIONS:  Current Outpatient Medications  Medication Sig Dispense Refill   diclofenac sodium (VOLTAREN) 1 % GEL Apply 2 g topically 2 (two) times daily as needed.     diphenoxylate-atropine (LOMOTIL) 2.5-0.025 MG tablet TAKE 2 TABLETS BY MOUTH 4 (FOUR) TIMES DAILY AS NEEDED FOR DIARRHEA OR LOOSE STOOLS. 60 tablet 1   gabapentin (NEURONTIN) 300 MG capsule TAKE 1 CAPSULE BY MOUTH TWICE A DAY (Patient taking differently: Take 600 mg by mouth at bedtime.) 60 capsule 2   pantoprazole (PROTONIX) 20 MG tablet Take 1 tablet by mouth daily as needed.     simvastatin (ZOCOR) 20 MG tablet Take 20 mg by mouth at bedtime.     traMADol-acetaminophen (ULTRACET) 37.5-325 MG tablet Take 1 tablet by mouth every 8 (eight) hours as needed.     tiZANidine (ZANAFLEX) 2 MG tablet TAKE 1 TABLET BY MOUTH NIGHTLY AS NEEDED FOR MUSCLE SPASMS. (Patient not taking: Reported on 11/13/2022) 90 tablet 1   No current facility-administered medications for this visit.   Facility-Administered Medications Ordered in Other Visits  Medication Dose Route Frequency Provider Last  Rate Last Admin   heparin lock flush 100 unit/mL  500 Units Intravenous Once Grayland Ormond, Kathlene November, MD       heparin lock flush 100 unit/mL  500 Units Intracatheter PRN Grayland Ormond, Kathlene November, MD       heparin lock flush 100 unit/mL  500 Units Intravenous Once Lloyd Huger, MD       heparin lock flush 100 unit/mL  500 Units Intravenous Once Lloyd Huger, MD       sodium chloride flush (NS) 0.9 % injection 10 mL  10 mL Intravenous PRN Lloyd Huger, MD   10 mL at 12/04/17 0901   sodium chloride flush (NS) 0.9 % injection 10 mL  10 mL Intracatheter PRN Lloyd Huger, MD       sodium chloride flush (NS) 0.9 % injection 10 mL  10 mL Intravenous PRN Lloyd Huger, MD   10 mL at 04/24/21 1610   yttrium-90 injection 96.0 millicurie  45.4 millicurie Intravenous Once Chrystal, Eulas Post, MD        VITAL SIGNS: BP 106/69   Pulse (!) 116   Temp (!) 97 F (36.1 C) (Tympanic)   Resp 18   SpO2 98%  There were no vitals filed for this visit.  Estimated body mass index is 18.52 kg/m as calculated from the following:   Height as of 10/19/22: '4\' 10"'$  (1.473 m).   Weight as of 10/19/22: 88 lb 9.6 oz (40.2 kg).  LABS: CBC:    Component Value Date/Time   WBC 6.9 09/05/2022 1445   HGB 13.5 09/05/2022 1445   HGB 13.7 09/28/2013 1538   HCT 42.4 09/05/2022 1445   HCT 41.0 09/28/2013 1538   PLT 201 09/05/2022 1445   PLT 229 09/28/2013 1538   MCV 97.0 09/05/2022 1445   MCV 93 09/28/2013 1538   NEUTROABS 4.1 09/05/2022 1445   NEUTROABS 5.3 09/28/2013 1538   LYMPHSABS 1.6 09/05/2022 1445   LYMPHSABS 1.8 09/28/2013 1538   MONOABS 1.0 09/05/2022 1445   MONOABS 0.9 09/28/2013 1538   EOSABS 0.2 09/05/2022 1445   EOSABS 0.1 09/28/2013 1538   BASOSABS 0.0 09/05/2022 1445   BASOSABS 0.0 09/28/2013 1538   Comprehensive Metabolic Panel:    Component Value Date/Time   NA 139 09/05/2022 1445   K 4.0 09/05/2022 1445   CL 106 09/05/2022 1445   CO2 27 09/05/2022 1445   BUN 12  09/05/2022 1445   CREATININE 0.71 09/05/2022 1445   GLUCOSE 77 09/05/2022 1445   CALCIUM 8.9 09/05/2022 1445   AST 20 09/05/2022 1445   ALT 11 09/05/2022 1445   ALKPHOS 68 09/05/2022 1445   BILITOT 0.6 09/05/2022 1445   PROT 6.8 09/05/2022 1445   ALBUMIN 3.6 09/05/2022 1445    RADIOGRAPHIC STUDIES: DG Chest 2 View  Result Date: 11/13/2022 CLINICAL DATA:  Shortness of breath for 2 weeks. Known pulmonary nodules. History of non-small cell lung cancer. EXAM: CHEST - 2 VIEW COMPARISON:  Chest x-ray 10/01/2022 FINDINGS: The right IJ power port is stable. The cardiac silhouette, mediastinal and hilar contours are stable. Stable tortuosity and calcification of the thoracic aorta. 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 Enlarging right lower lobe pulmonary nodule measuring 2 cm. It previously measured 1.6 cm. No pleural effusions, pulmonary edema or acute pulmonary infiltrates. The bony thorax is intact. Stable pectus deformity. IMPRESSION: 1. Enlarging right lower lobe pulmonary nodule. 2. Stable post treatment changes in the right hilar region. 3. No acute pulmonary findings. Electronically Signed   By: Marijo Sanes M.D.   On: 11/13/2022 10:14   DG SWALLOW FUNC OP MEDICARE SPEECH PATH  Result Date: 10/31/2022 Table formatting from the original result was not included. CLINICAL DATA:  Dysphagia. Possible aspiration with mucous plugging. EXAM: MODIFIED BARIUM SWALLOW TECHNIQUE: Different consistencies of barium were administered orally to the patient by the Speech Pathologist. Imaging of the pharynx was performed in the lateral projection. The radiologist was present in the fluoroscopy room for this study, providing personal supervision. FLUOROSCOPY: Radiation Exposure Index (as provided by the fluoroscopic device): 4.9 mGy COMPARISON:  None Available. FINDINGS: Vestibular  Penetration:  None seen. Aspiration:  None seen. Other:  None. IMPRESSION: Modified  barium swallow as described above. Please refer to the Speech Pathologists report for complete details and recommendations. Electronically Signed   By: Kathreen Devoid M.D.   On: 10/31/2022 13:19  10/31/22 1300 SLP Visit Information SLP Received On 10/31/22 Subjective Subjective "I hope it goes ok" Patient/Family Stated Goal not to have another bronchoscopy Pain Assessment Pain Assessment No/denies pain General Information Date of Onset 10/01/22 HPI Patient is a 77 y.o. female with past medical history including pulmonary nodules (right lower lung), stage IV rectal cancer, follicular lymphoma, SBRT x2 to  right lower lobe and right midlung due to metatstatic deposits, chemoradiation for locally advanced rectal cancer, and GERD  who was referred for MBS to r/o aspiration following bronchosopy on 10/01/22 which showed:  "total occlusion of the right middle lobe medial and lateral (B4/B5) subsegments with gelatinous inflammatory debris, the formal pathology revealed intense inflammatory change and calcifications." Pt denies any history of coughing, or choking with meals, but reports she eats soft foods due to difficulty chewing with missing dentition. Type of Study MBS-Modified Barium Swallow Study Previous Swallow Assessment none on file Diet Prior to this Study Dysphagia 3 (soft);Thin liquids Temperature Spikes Noted No Respiratory Status Room air History of Recent Intubation No Behavior/Cognition Alert;Cooperative;Pleasant mood Oral Cavity Assessment WFL Oral Care Completed by SLP No Oral Cavity - Dentition Missing dentition Vision Functional for self feeding Self-Feeding Abilities Able to feed self Patient Positioning Upright in chair Baseline Vocal Quality Normal Volitional Cough Strong Volitional Swallow Able to elicit Anatomy [facility] Pharyngeal Secretions Not observed secondary MBS Oral Motor/Sensory Function Overall Oral Motor/Sensory Function WFL Oral Preparation/Oral Phase Oral Phase WFL (given missing dentition)  Oral - Pudding Oral - Pudding Teaspoon WFL Oral - Nectar Oral - Nectar Teaspoon WFL Oral - Nectar Cup WFL Oral - Thin Oral - Thin Teaspoon WFL Oral - Thin Cup WFL Oral - Solids Oral - Regular Impaired mastication (d/t dentition) Pharyngeal Phase Pharyngeal Phase WFL Cervical Esophageal Phase Cervical Esophageal Phase [facility] Clinical Impression Clinical Impression Patient presents with functional oropharyngeal swallowing. There was no laryngeal penetration or tracheal aspiration observed on this exam. Missing dentition impacts mastication of regular solid, however otherwise oral containment, bolus control and transfer are within normal limits. Pharyngeal phase is characterized by adequate hyolaryngeal excursion, pharyngeal constriction, and tongue base retraction. Epiglottic deflection is complete. With incomplete mastication of solid, single piece of solid residue was retained along the posterior pharyngeal wall, which cleared completely with second swallow. Patient reports self-selecting softer foods due to difficulty chewing; she is presently awaiting dentures. Pharyngosesophageal phase of swallowing was unremarkable, with adequate clearance of all consistencies presented. Recommend pt continue with self-selected softer foods, thin liquids, meds whole with puree. SLP Visit Diagnosis Dysphagia, unspecified (R13.10) Impact on safety and function No limitations Swallow Evaluation Recommendations SLP Diet Recommendations Dysphagia 3 (Mech soft) solids;Thin liquid Liquid Administration via Cup;Straw Medication Administration Whole meds with liquid Supervision Patient able to self feed Compensations Slow rate;Small sips/bites Postural Changes Remain semi-upright after after feeds/meals (Comment) Treatment Plan Oral Care Recommendations Oral care BID Treatment Recommendations No treatment recommended at this time Follow Up Recommendations No SLP follow up Individuals Consulted Consulted and Agree with Results and  Recommendations Patient Report Sent to  Referring physician Progression Toward Goals Progression toward goals Goals met, education completed, patient discharged from SLP SLP Evaluations $ SLP Speech Visit 1 Visit SLP Evaluations $MBS Swallow 1 Procedure Deneise Lever, MS, [place] Pathologist [phone]    PERFORMANCE STATUS (ECOG) : 1 - Symptomatic but completely ambulatory  Review of Systems Unless otherwise noted, a complete review of systems is negative.  Physical Exam General: NAD Cardiovascular: regular rate and rhythm Pulmonary: clear ant fields Abdomen: soft, nontender, + bowel sounds GU: no suprapubic tenderness Extremities: no edema, no joint deformities Skin: no rashes Neurological: Weakness but otherwise nonfocal  Assessment and Plan:  Shortness of breath -chest x-ray and CT of the chest today suggest slight progression with enlarging right lower lobe pulmonary nodule but no evidence of infectious etiology.  Patient feels shortness of breath  is somewhat improved today.  Recommend to follow-up with pulmonary and medical oncology as already scheduled.  Hypokalemia -will start on oral supplementation.  Add on serum magnesium.  Will repeat labs when patient returns to see Dr. Grayland Ormond next week.    Patient expressed understanding and was in agreement with this plan. She also understands that She can call clinic at any time with any questions, concerns, or complaints.   Thank you for allowing me to participate in the care of this very pleasant patient.   Time Total: 15 minutes  Visit consisted of counseling and education dealing with the complex and emotionally intense issues of symptom management in the setting of serious illness.Greater than 50%  of this time was spent counseling and coordinating care related to the above assessment and plan.  Signed by: Altha Harm, PhD, NP-C

## 2022-11-14 ENCOUNTER — Ambulatory Visit: Admission: RE | Admit: 2022-11-14 | Payer: 59 | Source: Ambulatory Visit

## 2022-11-19 ENCOUNTER — Other Ambulatory Visit: Payer: Self-pay | Admitting: Oncology

## 2022-11-19 ENCOUNTER — Ambulatory Visit: Payer: 59 | Admitting: Oncology

## 2022-11-20 ENCOUNTER — Telehealth: Payer: Self-pay

## 2022-11-20 ENCOUNTER — Encounter: Payer: Self-pay | Admitting: Pulmonary Disease

## 2022-11-20 ENCOUNTER — Ambulatory Visit (INDEPENDENT_AMBULATORY_CARE_PROVIDER_SITE_OTHER): Payer: 59 | Admitting: Pulmonary Disease

## 2022-11-20 VITALS — BP 120/72 | HR 90 | Temp 98.2°F | Ht <= 58 in | Wt 88.0 lb

## 2022-11-20 DIAGNOSIS — R911 Solitary pulmonary nodule: Secondary | ICD-10-CM

## 2022-11-20 DIAGNOSIS — R052 Subacute cough: Secondary | ICD-10-CM | POA: Diagnosis not present

## 2022-11-20 DIAGNOSIS — C8208 Follicular lymphoma grade I, lymph nodes of multiple sites: Secondary | ICD-10-CM

## 2022-11-20 DIAGNOSIS — J9809 Other diseases of bronchus, not elsewhere classified: Secondary | ICD-10-CM

## 2022-11-20 DIAGNOSIS — C20 Malignant neoplasm of rectum: Secondary | ICD-10-CM

## 2022-11-20 NOTE — H&P (View-Only) (Signed)
Subjective:    Patient ID: Veronica Boyd, female    DOB: 16-Feb-1946, 77 y.o.   MRN: [id number] Patient Care Team: Tracie Harrier, MD as PCP - General (Internal Medicine) Lloyd Huger, MD as Consulting Physician (Oncology) [name], MD as Referring Physician (Radiation Oncology) Jacquelin Hawking, NP as Nurse Practitioner (Oncology) Clent Jacks, RN as Oncology Nurse Navigator Tyler Pita, MD as Consulting Physician (Pulmonary Disease)  Chief Complaint  Patient presents with   Follow-up    Last Monday she had a lot of SOB with exertion. Better since then. No wheezing. Dry cough.    HPI Veronica Boyd is a 77 year old lifelong never smoker who follows up for pulmonary nodules in the setting of stage IV rectal cancer and a history of follicular lymphoma.  Her primary oncologist is Dr. Delight Hoh. The patient had undergone SBRT x 2 to her right lower lobe and right midlung due to what where apparent metastatic deposits.  She has also previously completed concurrent chemoradiation therapy as well as FOLFOX for locally advanced rectal cancer. The patient underwent bronchoscopy on 01 October 2022 this showed that the patient had total occlusion of the right middle lobe medial and lateral (B4/B5) subsegments with gelatinous inflammatory debris, the formal pathology revealed intense inflammatory change and calcifications.  Of note the patient had had issues with fractured teeth and there is a concern for aspiration of the tooth remnants.  The patient had experienced intractable cough prior to the procedure. Since her bronchoscopy she has had marked improvement on her cough and now this has completely resolved.  She occasionally has difficulty taking a deep breath but this has also improved from prior.  She had a normal swallow eval on 27 December.  I suspect that her aspiration of chipped teeth occurred during sleep and patient was unaware.  This issue added  complexity to her bronchoscopic procedure as it distorted her airway.  A follow-up CT chest on 9 January that showed that 2 of the nodules noted previously had resolved, her right middle lobe collapse also had resolved but the index nodule in the right lower lobe had increased in size to 2.3 cm.  This will require attempt at [place] see now that her airway architecture has normalized on CT.  She has not had any hemoptysis.  She has not had any fevers, chills or sweats.  No chest pain. No dyspnea per se except for an episode she had on the day of her CT (9 January).   Previously, she had 12 teeth extracted due to being in poor repair and chipped.   She understands the need for re-biopsy and would like to make sure that her Port-A-Cath is accessed so she does not have to undergo venipunctures.   Review of Systems A 10 point review of systems was performed and it is as noted above otherwise negative.  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   Rectal cancer ([place]) 11/02/2020   Rectal mass 10/11/2020   Gastroesophageal reflux disease without esophagitis 08/25/2018   Protein-calorie malnutrition, severe 12/20/2017   Anemia    Thrombocytopenia (HCC)    Sepsis ([place]) 12/19/2017   Anemia due to antineoplastic chemotherapy 12/17/2017   Goals of care, counseling/discussion 09/19/2017   Axillary mass, right 04/03/2017   Pure hypercholesterolemia 09/10/2016   Postmenopausal osteoporosis 38/18/2993   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 ([place]) 07/24/2016   Mass of right chest wall 06/06/2016   Primary osteoarthritis involving multiple joints 12/14/2015   Transient insomnia 08/27/2015   Mixed hyperlipidemia 03/01/2015   Nocturnal muscle cramps 03/01/2015   Osteoporosis 11/08/2014   Primary osteoarthritis of both knees 07/05/2014   Hyperlipidemia  03/19/2014   Osteoarthritis of knee 03/19/2014   Abnormal mammogram 03/03/2013   Vitamin D deficiency 05/09/2012   Social History   Tobacco Use   Smoking status: Never   Smokeless tobacco: Never  Substance Use Topics   Alcohol use: No   No Known Allergies  Current Meds  Medication Sig   diclofenac sodium (VOLTAREN) 1 % GEL Apply 2 g topically 2 (two) times daily as needed.   diphenoxylate-atropine (LOMOTIL) 2.5-0.025 MG tablet TAKE 2 TABLETS BY MOUTH 4 (FOUR) TIMES DAILY AS NEEDED FOR DIARRHEA OR LOOSE STOOLS.   gabapentin (NEURONTIN) 300 MG capsule TAKE 1 CAPSULE BY MOUTH TWICE A DAY   pantoprazole (PROTONIX) 20 MG tablet Take 1 tablet by mouth daily as needed.   potassium chloride (KLOR-CON M) 10 MEQ tablet Take 1 tablet (10 mEq total) by mouth daily.   simvastatin (ZOCOR) 20 MG tablet Take 20 mg by mouth at bedtime.   tiZANidine (ZANAFLEX) 2 MG tablet TAKE 1 TABLET BY MOUTH NIGHTLY AS NEEDED FOR MUSCLE SPASMS.   traMADol-acetaminophen (ULTRACET) 37.5-325 MG tablet Take 1 tablet by mouth every 8 (eight) hours as needed.   Immunization History  Administered Date(s) Administered   Influenza-Unspecified 09/20/2014   PFIZER(Purple Top)SARS-COV-2 Vaccination 01/07/2020, 02/02/2020   Unspecified SARS-COV-2 Vaccination 02/02/2020   Zoster Recombinat (Shingrix) 11/29/2019, 03/20/2020   Zoster, Live 09/20/2014       Objective:   Physical Exam BP 120/72 (BP Location: Left Arm, Cuff Size: Normal)   Pulse 90   Temp 98.2 F (36.8 C)   Ht '4\' 10"'$  (1.473 m)   Wt 88 lb (39.9 kg)   SpO2 92%   BMI 18.39 kg/m   SpO2: 92 % O2 Device: None (Room air)  GENERAL: Thin, elderly female, pale, fully ambulatory.  No conversational dyspnea. HEAD: Normocephalic, atraumatic.  EYES: Pupils equal, round, reactive to light.  No scleral icterus.  MOUTH: Nose/mouth/throat not examined due to institutional masking requirements.  She has had 12 teeth extracted which were previously noted to be  chipped and in poor repair. NECK: Supple. No thyromegaly. Trachea midline. No JVD.  No adenopathy. PULMONARY: Good air entry bilaterally.  No adventitious sounds. CARDIOVASCULAR: S1 and S2. Regular rate and rhythm.  No rubs, murmurs or gallops heard. ABDOMEN: Scaphoid otherwise benign. MUSCULOSKELETAL: No joint deformity, no clubbing, no edema.  NEUROLOGIC: Grossly nonfocal.  Fully ambulatory.  Speech is fluent. SKIN: Intact,warm,dry. PSYCH: Mood and behavior normal.  Prior CT chest performed 24 September 2022 showing 3 nodules on the right lower lobe, there was also architectural distortion and volume loss on the right lung noted on that study:    Most recent CT chest performed 13 November 2022 showing the 2 other lower lobe nodules have resolved, there is resolved architectural distortion, there is enlargement of the right lower lobe previously noted pulmonary nodule now at 2.3 cm diameter:     Assessment & Plan:     ICD-10-CM   1. Lung nodule - RLL 2.3 cm  R91.1 CT SUPER D CHEST WO [facility] PILOT   Increased in size from 1.9 cm to 2.3 cm Will require biopsy 2 other nodules have resolved Resolved nodules were likely due to aspiration  2. Bronchial obstruction - RESOLVED  J98.09    Resolved after bronchoscopy performed previously Likely due to aspiration of teeth fragments No evidence of architectural distortion noted on previous CT    3. Subacute cough - RESOLVED  R05.2    Resolved after bronchial obstruction cleared    4. Rectal cancer ([place])  C20    This issue adds complexity to her management vis--vis lung nodule    5. 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)  C82.08    This issue adds complexity to her management vis--vis lung nodule     Orders Placed This Encounter  Procedures   CT SUPER D CHEST WO [facility] PILOT    Having Bronch on 11/28/2022    Standing Status:   Future    Standing Expiration Date:   11/21/2023    Order Specific  Question:   Preferred imaging location?    Answer:   [facility]   The patient has been scheduled for robotic assisted navigational bronchoscopy for diagnosis of the right lower lobe lung nodule on 28 November 2022.  Preprocedural mapping CT has been ordered.  Patient is aware of the potential benefits, limitations and complication of the procedure, she agrees to go ahead.  Will see the patient in follow-up in 4 to 6 weeks time however we will stay in contact with her before during and after the procedure.  She is to contact us prior to her follow-up should any new difficulties arise.  I discussed the above with Dr. Delight Hoh, the patient's primary oncologist.  Renold Don, MD Advanced Bronchoscopy PCCM [place] [facility]    *This note was dictated using voice recognition software/Dragon.  Despite best efforts to proofread, errors can occur which can change the meaning. Any transcriptional errors that result from this process are unintentional and may not be fully corrected at the time of dictation.

## 2022-11-20 NOTE — Progress Notes (Signed)
Subjective:    Patient ID: Veronica Boyd, female    DOB: [birth year], 77 y.o.   MRN: [id number] Patient Care Team: Tracie Harrier, MD as PCP - General (Internal Medicine) Lloyd Huger, MD as Consulting Physician (Oncology) [name], MD as Referring Physician (Radiation Oncology) Jacquelin Hawking, NP as Nurse Practitioner (Oncology) Clent Jacks, RN as Oncology Nurse Navigator Tyler Pita, MD as Consulting Physician (Pulmonary Disease)  Chief Complaint  Patient presents with   Follow-up    Last Monday she had a lot of SOB with exertion. Better since then. No wheezing. Dry cough.    HPI Veronica Boyd is a 77 year old lifelong never smoker who follows up for pulmonary nodules in the setting of stage IV rectal cancer and a history of follicular lymphoma.  Her primary oncologist is Dr. Delight Hoh. The patient had undergone SBRT x 2 to her right lower lobe and right midlung due to what where apparent metastatic deposits.  She has also previously completed concurrent chemoradiation therapy as well as FOLFOX for locally advanced rectal cancer. The patient underwent bronchoscopy on 01 October 2022 this showed that the patient had total occlusion of the right middle lobe medial and lateral (B4/B5) subsegments with gelatinous inflammatory debris, the formal pathology revealed intense inflammatory change and calcifications.  Of note the patient had had issues with fractured teeth and there is a concern for aspiration of the tooth remnants.  The patient had experienced intractable cough prior to the procedure. Since her bronchoscopy she has had marked improvement on her cough and now this has completely resolved.  She occasionally has difficulty taking a deep breath but this has also improved from prior.  She had a normal swallow eval on 27 December.  I suspect that her aspiration of chipped teeth occurred during sleep and patient was unaware.  This issue added  complexity to her bronchoscopic procedure as it distorted her airway.  A follow-up CT chest on 9 January that showed that 2 of the nodules noted previously had resolved, her right middle lobe collapse also had resolved but the index nodule in the right lower lobe had increased in size to 2.3 cm.  This will require attempt at [place] see now that her airway architecture has normalized on CT.  She has not had any hemoptysis.  She has not had any fevers, chills or sweats.  No chest pain. No dyspnea per se except for an episode she had on the day of her CT (9 January).   Previously, she had 12 teeth extracted due to being in poor repair and chipped.   She understands the need for re-biopsy and would like to make sure that her Port-A-Cath is accessed so she does not have to undergo venipunctures.   Review of Systems A 10 point review of systems was performed and it is as noted above otherwise negative.  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   Rectal cancer ([place]) 11/02/2020   Rectal mass 10/11/2020   Gastroesophageal reflux disease without esophagitis 08/25/2018   Protein-calorie malnutrition, severe 12/20/2017   Anemia    Thrombocytopenia (HCC)    Sepsis ([place]) 12/19/2017   Anemia due to antineoplastic chemotherapy 12/17/2017   Goals of care, counseling/discussion 09/19/2017   Axillary mass, right 04/03/2017   Pure hypercholesterolemia 09/10/2016   Postmenopausal osteoporosis 90/24/0973   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 ([place]) 07/24/2016   Mass of right chest wall 06/06/2016   Primary osteoarthritis involving multiple joints 12/14/2015   Transient insomnia 08/27/2015   Mixed hyperlipidemia 03/01/2015   Nocturnal muscle cramps 03/01/2015   Osteoporosis 11/08/2014   Primary osteoarthritis of both knees 07/05/2014   Hyperlipidemia  03/19/2014   Osteoarthritis of knee 03/19/2014   Abnormal mammogram 03/03/2013   Vitamin D deficiency 05/09/2012   Social History   Tobacco Use   Smoking status: Never   Smokeless tobacco: Never  Substance Use Topics   Alcohol use: No   No Known Allergies  Current Meds  Medication Sig   diclofenac sodium (VOLTAREN) 1 % GEL Apply 2 g topically 2 (two) times daily as needed.   diphenoxylate-atropine (LOMOTIL) 2.5-0.025 MG tablet TAKE 2 TABLETS BY MOUTH 4 (FOUR) TIMES DAILY AS NEEDED FOR DIARRHEA OR LOOSE STOOLS.   gabapentin (NEURONTIN) 300 MG capsule TAKE 1 CAPSULE BY MOUTH TWICE A DAY   pantoprazole (PROTONIX) 20 MG tablet Take 1 tablet by mouth daily as needed.   potassium chloride (KLOR-CON M) 10 MEQ tablet Take 1 tablet (10 mEq total) by mouth daily.   simvastatin (ZOCOR) 20 MG tablet Take 20 mg by mouth at bedtime.   tiZANidine (ZANAFLEX) 2 MG tablet TAKE 1 TABLET BY MOUTH NIGHTLY AS NEEDED FOR MUSCLE SPASMS.   traMADol-acetaminophen (ULTRACET) 37.5-325 MG tablet Take 1 tablet by mouth every 8 (eight) hours as needed.   Immunization History  Administered Date(s) Administered   Influenza-Unspecified 09/20/2014   PFIZER(Purple Top)SARS-COV-2 Vaccination 01/07/2020, 02/02/2020   Unspecified SARS-COV-2 Vaccination 02/02/2020   Zoster Recombinat (Shingrix) 11/29/2019, 03/20/2020   Zoster, Live 09/20/2014       Objective:   Physical Exam BP 120/72 (BP Location: Left Arm, Cuff Size: Normal)   Pulse 90   Temp 98.2 F (36.8 C)   Ht '4\' 10"'$  (1.473 m)   Wt 88 lb (39.9 kg)   SpO2 92%   BMI 18.39 kg/m   SpO2: 92 % O2 Device: None (Room air)  GENERAL: Thin, elderly female, pale, fully ambulatory.  No conversational dyspnea. HEAD: Normocephalic, atraumatic.  EYES: Pupils equal, round, reactive to light.  No scleral icterus.  MOUTH: Nose/mouth/throat not examined due to institutional masking requirements.  She has had 12 teeth extracted which were previously noted to be  chipped and in poor repair. NECK: Supple. No thyromegaly. Trachea midline. No JVD.  No adenopathy. PULMONARY: Good air entry bilaterally.  No adventitious sounds. CARDIOVASCULAR: S1 and S2. Regular rate and rhythm.  No rubs, murmurs or gallops heard. ABDOMEN: Scaphoid otherwise benign. MUSCULOSKELETAL: No joint deformity, no clubbing, no edema.  NEUROLOGIC: Grossly nonfocal.  Fully ambulatory.  Speech is fluent. SKIN: Intact,warm,dry. PSYCH: Mood and behavior normal.  Prior CT chest performed 24 September 2022 showing 3 nodules on the right lower lobe, there was also architectural distortion and volume loss on the right lung noted on that study:    Most recent CT chest performed 13 November 2022 showing the 2 other lower lobe nodules have resolved, there is resolved architectural distortion, there is enlargement of the right lower lobe previously noted pulmonary nodule now at 2.3 cm diameter:     Assessment & Plan:     ICD-10-CM   1. Lung nodule - RLL 2.3 cm  R91.1 CT SUPER D CHEST WO [facility] PILOT   Increased in size from 1.9 cm to 2.3 cm Will require biopsy 2 other nodules have resolved Resolved nodules were likely due to aspiration  2. Bronchial obstruction - RESOLVED  J98.09    Resolved after bronchoscopy performed previously Likely due to aspiration of teeth fragments No evidence of architectural distortion noted on previous CT    3. Subacute cough - RESOLVED  R05.2    Resolved after bronchial obstruction cleared    4. Rectal cancer ([place])  C20    This issue adds complexity to her management vis--vis lung nodule    5. 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)  C82.08    This issue adds complexity to her management vis--vis lung nodule     Orders Placed This Encounter  Procedures   CT SUPER D CHEST WO [facility] PILOT    Having Bronch on 11/28/2022    Standing Status:   Future    Standing Expiration Date:   11/21/2023    Order Specific  Question:   Preferred imaging location?    Answer:   [facility]   The patient has been scheduled for robotic assisted navigational bronchoscopy for diagnosis of the right lower lobe lung nodule on 28 November 2022.  Preprocedural mapping CT has been ordered.  Patient is aware of the potential benefits, limitations and complication of the procedure, she agrees to go ahead.  Will see the patient in follow-up in 4 to 6 weeks time however we will stay in contact with her before during and after the procedure.  She is to contact us prior to her follow-up should any new difficulties arise.  I discussed the above with Dr. Delight Hoh, the patient's primary oncologist.  Renold Don, MD Advanced Bronchoscopy PCCM [place] [facility]    *This note was dictated using voice recognition software/Dragon.  Despite best efforts to proofread, errors can occur which can change the meaning. Any transcriptional errors that result from this process are unintentional and may not be fully corrected at the time of dictation.

## 2022-11-20 NOTE — Patient Instructions (Signed)
The biopsy on 24 January.   Will see you in follow-up in 4 to 6 weeks time.

## 2022-11-20 NOTE — Telephone Encounter (Signed)
Robotic Bronch 11/28/2022 at 12:30pm Lung Nodule [id number]  Rodena Piety please see Bronch info.

## 2022-11-21 ENCOUNTER — Inpatient Hospital Stay: Payer: 59

## 2022-11-21 ENCOUNTER — Inpatient Hospital Stay: Payer: 59 | Admitting: Oncology

## 2022-11-21 NOTE — Telephone Encounter (Signed)
For the code [phone] Prior Auth Not Required Refer # [id number]

## 2022-11-21 NOTE — Telephone Encounter (Signed)
PreAdmit- 11/23/2022 8a-1p Covid- 11/26/2022 8a-12p  I have notified Robin (DPR) of the patient's appointments. Nothing further is needed.

## 2022-11-23 ENCOUNTER — Encounter
Admission: RE | Admit: 2022-11-23 | Discharge: 2022-11-23 | Disposition: A | Discharge status: Home / Self Care | Payer: 59 | Source: Ambulatory Visit | Attending: Pulmonary Disease | Admitting: Pulmonary Disease

## 2022-11-23 DIAGNOSIS — Z01812 Encounter for preprocedural laboratory examination: Secondary | ICD-10-CM

## 2022-11-23 DIAGNOSIS — Z20822 Contact with and (suspected) exposure to covid-19: Secondary | ICD-10-CM

## 2022-11-23 DIAGNOSIS — E876 Hypokalemia: Secondary | ICD-10-CM

## 2022-11-23 HISTORY — DX: Localized swelling, mass and lump, trunk: R22.2

## 2022-11-23 HISTORY — DX: Unspecified severe protein-calorie malnutrition: E43

## 2022-11-23 HISTORY — DX: Gastro-esophageal reflux disease without esophagitis: K21.9

## 2022-11-23 HISTORY — DX: Thrombocytopenia, unspecified: D69.6

## 2022-11-23 HISTORY — DX: Adverse effect of antineoplastic and immunosuppressive drugs, initial encounter: G62.0

## 2022-11-23 HISTORY — DX: Anemia due to antineoplastic chemotherapy: D64.81

## 2022-11-23 HISTORY — DX: Solitary pulmonary nodule: R91.1

## 2022-11-23 HISTORY — DX: Other diseases of bronchus, not elsewhere classified: J98.09

## 2022-11-23 HISTORY — DX: Dyspnea, unspecified: R06.00

## 2022-11-23 HISTORY — DX: Adverse effect of antineoplastic and immunosuppressive drugs, initial encounter: T45.1X5A

## 2022-11-23 NOTE — Patient Instructions (Signed)
Your procedure is scheduled on:11-28-22 Wednesday Report to the Registration Desk on the 1st floor of the [facility].Then proceed to the 2nd floor Surgery Desk To find out your arrival time, please call [phone] between 1PM - 3PM on:11-27-22 Tuesday If your arrival time is 6:00 am, do not arrive prior to that time as the [facility] entrance doors do not open until 6:00 am.  REMEMBER: Instructions that are not followed completely may result in serious medical risk, up to and including death; or upon the discretion of your surgeon and anesthesiologist your surgery may need to be rescheduled.  Do not eat food OR drink any liquids after midnight the night before surgery.  No gum chewing, lozengers or hard candies.  TAKE THESE MEDICATIONS THE MORNING OF SURGERY WITH A SIP OF WATER: -pantoprazole (PROTONIX) -take one the night before and one on the morning of surgery - helps to prevent nausea after surgery.)  One week prior to surgery: Stop Anti-inflammatories (NSAIDS) such as Advil, Aleve, Ibuprofen, Motrin, Naproxen, Naprosyn and Aspirin based products such as Excedrin, [place]ys Powder, BC Powder. You may however, continue to take Tylenol/traMADol-acetaminophen (ULTRACET) if needed for pain up until the day of surgery.  No Alcohol for 24 hours before or after surgery.  No Smoking including e-cigarettes for 24 hours prior to surgery.  No chewable tobacco products for at least 6 hours prior to surgery.  No nicotine patches on the day of surgery.  Do not use any "recreational" drugs for at least a week prior to your surgery.  Please be advised that the combination of cocaine and anesthesia may have negative outcomes, up to and including death. If you test positive for cocaine, your surgery will be cancelled.  On the morning of surgery brush your teeth with toothpaste and water, you may rinse your mouth with mouthwash if you wish. Do not swallow any toothpaste or mouthwash.  Do not wear  jewelry, make-up, hairpins, clips or nail polish.  Do not wear lotions, powders, or perfumes.   Do not shave body from the neck down 48 hours prior to surgery just in case you cut yourself which could leave a site for infection.  Also, freshly shaved skin may become irritated if using the CHG soap.  Contact lenses, hearing aids and dentures may not be worn into surgery.  Do not bring valuables to the hospital. [facility] is not responsible for any missing/lost belongings or valuables.    Notify your doctor if there is any change in your medical condition (cold, fever, infection).  Wear comfortable clothing (specific to your surgery type) to the hospital.  After surgery, you can help prevent lung complications by doing breathing exercises.  Take deep breaths and cough every 1-2 hours. Your doctor may order a device called an Incentive Spirometer to help you take deep breaths. When coughing or sneezing, hold a pillow firmly against your incision with both hands. This is called "splinting." Doing this helps protect your incision. It also decreases belly discomfort.  If you are being admitted to the hospital overnight, leave your suitcase in the car. After surgery it may be brought to your room.  If you are being discharged the day of surgery, you will not be allowed to drive home. You will need a responsible adult (18 years or older) to drive you home and stay with you that night.   If you are taking public transportation, you will need to have a responsible adult (18 years or older) with  you. Please confirm with your physician that it is acceptable to use public transportation.   Please call the [place] Dept. at [phone] if you have any questions about these instructions.  Surgery Visitation Policy:  Patients undergoing a surgery or procedure may have two family members or support persons with them as long as the person is not COVID-19 positive or experiencing its  symptoms.  Due to an increase in RSV and influenza rates and associated hospitalizations, children ages 48 and under will not be able to visit patients in [facility]. Masks continue to be strongly recommended.

## 2022-11-26 ENCOUNTER — Encounter: Payer: Self-pay | Admitting: Urgent Care

## 2022-11-26 ENCOUNTER — Encounter
Admission: RE | Admit: 2022-11-26 | Discharge: 2022-11-26 | Disposition: A | Discharge status: Home / Self Care | Payer: 59 | Source: Ambulatory Visit | Attending: Pulmonary Disease | Admitting: Pulmonary Disease

## 2022-11-26 DIAGNOSIS — Z20822 Contact with and (suspected) exposure to covid-19: Secondary | ICD-10-CM

## 2022-11-26 DIAGNOSIS — Z1152 Encounter for screening for COVID-19: Secondary | ICD-10-CM | POA: Diagnosis not present

## 2022-11-26 DIAGNOSIS — E876 Hypokalemia: Secondary | ICD-10-CM

## 2022-11-26 DIAGNOSIS — Z01812 Encounter for preprocedural laboratory examination: Secondary | ICD-10-CM

## 2022-11-26 LAB — BASIC METABOLIC PANEL
Anion gap: 9 (ref 5–15)
BUN: 13 mg/dL (ref 8–23)
CO2: 26 mmol/L (ref 22–32)
Calcium: 9 mg/dL (ref 8.9–10.3)
Chloride: 103 mmol/L (ref 98–111)
Creatinine, Ser: 0.67 mg/dL (ref 0.44–1.00)
GFR, Estimated: >60 mL/min (ref >60)
Glucose, Bld: 90 mg/dL (ref 70–99)
Potassium: 3.9 mmol/L (ref 3.5–5.1)
Sodium: 138 mmol/L (ref 135–145)

## 2022-11-26 LAB — SARS CORONAVIRUS 2 (TAT 6-24 HRS): SARS Coronavirus 2: NEGATIVE

## 2022-11-27 ENCOUNTER — Ambulatory Visit
Admission: RE | Admit: 2022-11-27 | Discharge: 2022-11-27 | Disposition: A | Discharge status: Home / Self Care | Payer: 59 | Source: Ambulatory Visit | Attending: Pulmonary Disease | Admitting: Pulmonary Disease

## 2022-11-27 DIAGNOSIS — R911 Solitary pulmonary nodule: Secondary | ICD-10-CM | POA: Diagnosis not present

## 2022-11-28 ENCOUNTER — Ambulatory Visit: Payer: 59

## 2022-11-28 ENCOUNTER — Ambulatory Visit: Payer: 59 | Admitting: Urgent Care

## 2022-11-28 ENCOUNTER — Telehealth: Payer: Self-pay | Admitting: Pulmonary Disease

## 2022-11-28 ENCOUNTER — Ambulatory Visit
Admission: RE | Admit: 2022-11-28 | Discharge: 2022-11-28 | Disposition: A | Discharge status: Home / Self Care | Payer: 59 | Attending: Pulmonary Disease | Admitting: Pulmonary Disease

## 2022-11-28 ENCOUNTER — Encounter: Payer: Self-pay | Admitting: Pulmonary Disease

## 2022-11-28 ENCOUNTER — Encounter
Admission: RE | Disposition: A | Discharge status: Home / Self Care | Payer: Self-pay | Source: Home / Self Care | Attending: Pulmonary Disease

## 2022-11-28 ENCOUNTER — Other Ambulatory Visit: Payer: Self-pay

## 2022-11-28 DIAGNOSIS — M1990 Unspecified osteoarthritis, unspecified site: Secondary | ICD-10-CM | POA: Diagnosis not present

## 2022-11-28 DIAGNOSIS — G629 Polyneuropathy, unspecified: Secondary | ICD-10-CM | POA: Diagnosis not present

## 2022-11-28 DIAGNOSIS — Z8572 Personal history of non-Hodgkin lymphomas: Secondary | ICD-10-CM | POA: Diagnosis not present

## 2022-11-28 DIAGNOSIS — R911 Solitary pulmonary nodule: Secondary | ICD-10-CM | POA: Insufficient documentation

## 2022-11-28 DIAGNOSIS — K219 Gastro-esophageal reflux disease without esophagitis: Secondary | ICD-10-CM | POA: Insufficient documentation

## 2022-11-28 DIAGNOSIS — C8208 Follicular lymphoma grade I, lymph nodes of multiple sites: Secondary | ICD-10-CM

## 2022-11-28 DIAGNOSIS — Z85048 Personal history of other malignant neoplasm of rectum, rectosigmoid junction, and anus: Secondary | ICD-10-CM | POA: Insufficient documentation

## 2022-11-28 DIAGNOSIS — C20 Malignant neoplasm of rectum: Secondary | ICD-10-CM

## 2022-11-28 DIAGNOSIS — Z9221 Personal history of antineoplastic chemotherapy: Secondary | ICD-10-CM | POA: Insufficient documentation

## 2022-11-28 SURGERY — BRONCHOSCOPY, WITH BIOPSY USING ELECTROMAGNETIC NAVIGATION
Anesthesia: General | Laterality: Right

## 2022-11-28 MED ORDER — HEPARIN SOD (PORK) LOCK FLUSH 100 UNIT/ML IV SOLN
INTRAVENOUS | Status: AC
  Administered 2022-11-28: 500 [IU] via INTRAVENOUS
  Filled 2022-11-28: qty 5

## 2022-11-28 MED ORDER — HEPARIN SOD (PORK) LOCK FLUSH 100 UNIT/ML IV SOLN: 500.0000 [IU] | Freq: Once | INTRAVENOUS | Status: AC

## 2022-11-28 MED ORDER — LACTATED RINGERS IV SOLN: INTRAVENOUS | Status: DC

## 2022-11-28 MED ORDER — SUGAMMADEX SODIUM 200 MG/2ML IV SOLN
INTRAVENOUS | Status: DC | PRN
  Administered 2022-11-28: 200 mg via INTRAVENOUS

## 2022-11-28 MED ORDER — PROPOFOL 10 MG/ML IV BOLUS
INTRAVENOUS | Status: DC | PRN
  Administered 2022-11-28: 10 mg via INTRAVENOUS
  Administered 2022-11-28: 60 mg via INTRAVENOUS
  Administered 2022-11-28: 20 mg via INTRAVENOUS

## 2022-11-28 MED ORDER — MIDAZOLAM HCL 2 MG/2ML IJ SOLN
INTRAMUSCULAR | Status: AC
  Filled 2022-11-28: qty 2

## 2022-11-28 MED ORDER — PROPOFOL 10 MG/ML IV BOLUS
INTRAVENOUS | Status: AC
  Filled 2022-11-28: qty 40

## 2022-11-28 MED ORDER — IPRATROPIUM-ALBUTEROL 0.5-2.5 (3) MG/3ML IN SOLN
RESPIRATORY_TRACT | Status: AC
  Administered 2022-11-28: 3 mL via RESPIRATORY_TRACT
  Filled 2022-11-28: qty 3

## 2022-11-28 MED ORDER — ONDANSETRON HCL 4 MG/2ML IJ SOLN: 4.0000 mg | Freq: Once | INTRAMUSCULAR | Status: DC | PRN

## 2022-11-28 MED ORDER — SEVOFLURANE IN SOLN
RESPIRATORY_TRACT | Status: AC
  Filled 2022-11-28: qty 250

## 2022-11-28 MED ORDER — ONDANSETRON HCL 4 MG/2ML IJ SOLN
INTRAMUSCULAR | Status: DC | PRN
  Administered 2022-11-28: 4 mg via INTRAVENOUS

## 2022-11-28 MED ORDER — OXYCODONE HCL 5 MG/5ML PO SOLN: 5.0000 mg | Freq: Once | ORAL | Status: DC | PRN

## 2022-11-28 MED ORDER — IPRATROPIUM-ALBUTEROL 0.5-2.5 (3) MG/3ML IN SOLN: 3.0000 mL | Freq: Once | RESPIRATORY_TRACT | Status: AC

## 2022-11-28 MED ORDER — ACETAMINOPHEN 10 MG/ML IV SOLN: 15.0000 mg/kg | Freq: Once | INTRAVENOUS | Status: DC | PRN

## 2022-11-28 MED ORDER — PROPOFOL 10 MG/ML IV BOLUS
INTRAVENOUS | Status: AC
  Filled 2022-11-28: qty 20

## 2022-11-28 MED ORDER — PROPOFOL 1000 MG/100ML IV EMUL
INTRAVENOUS | Status: AC
  Filled 2022-11-28: qty 100

## 2022-11-28 MED ORDER — PHENYLEPHRINE HCL-NACL 20-0.9 MG/250ML-% IV SOLN
INTRAVENOUS | Status: AC
  Filled 2022-11-28: qty 250

## 2022-11-28 MED ORDER — ORAL CARE MOUTH RINSE: 15.0000 mL | Freq: Once | OROMUCOSAL | Status: AC

## 2022-11-28 MED ORDER — LIDOCAINE HCL (CARDIAC) PF 100 MG/5ML IV SOSY
PREFILLED_SYRINGE | INTRAVENOUS | Status: DC | PRN
  Administered 2022-11-28: 30 mg via INTRAVENOUS

## 2022-11-28 MED ORDER — PHENYLEPHRINE 80 MCG/ML (10ML) SYRINGE FOR IV PUSH (FOR BLOOD PRESSURE SUPPORT)
PREFILLED_SYRINGE | INTRAVENOUS | Status: DC | PRN
  Administered 2022-11-28 (×2): 160 ug via INTRAVENOUS
  Administered 2022-11-28: 80 ug via INTRAVENOUS
  Administered 2022-11-28: 160 ug via INTRAVENOUS

## 2022-11-28 MED ORDER — OXYCODONE HCL 5 MG PO TABS: 5.0000 mg | ORAL_TABLET | Freq: Once | ORAL | Status: DC | PRN

## 2022-11-28 MED ORDER — FENTANYL CITRATE (PF) 100 MCG/2ML IJ SOLN: 25.0000 ug | INTRAMUSCULAR | Status: DC | PRN

## 2022-11-28 MED ORDER — SODIUM CHLORIDE 0.9 % IV SOLN: INTRAVENOUS | Status: DC | PRN

## 2022-11-28 MED ORDER — ROCURONIUM BROMIDE 100 MG/10ML IV SOLN
INTRAVENOUS | Status: DC | PRN
  Administered 2022-11-28: 30 mg via INTRAVENOUS

## 2022-11-28 MED ORDER — PHENYLEPHRINE HCL-NACL 20-0.9 MG/250ML-% IV SOLN
INTRAVENOUS | Status: DC | PRN
  Administered 2022-11-28: 50 ug/min via INTRAVENOUS

## 2022-11-28 MED ORDER — CHLORHEXIDINE GLUCONATE 0.12 % MT SOLN
OROMUCOSAL | Status: AC
  Administered 2022-11-28: 15 mL via OROMUCOSAL
  Filled 2022-11-28: qty 15

## 2022-11-28 MED ORDER — SODIUM CHLORIDE 0.9 % IV SOLN: Freq: Once | INTRAVENOUS | Status: AC

## 2022-11-28 MED ORDER — FENTANYL CITRATE (PF) 100 MCG/2ML IJ SOLN
INTRAMUSCULAR | Status: AC
  Filled 2022-11-28: qty 2

## 2022-11-28 MED ORDER — PHENYLEPHRINE 80 MCG/ML (10ML) SYRINGE FOR IV PUSH (FOR BLOOD PRESSURE SUPPORT)
PREFILLED_SYRINGE | INTRAVENOUS | Status: AC
  Filled 2022-11-28: qty 10

## 2022-11-28 MED ORDER — FENTANYL CITRATE (PF) 100 MCG/2ML IJ SOLN
INTRAMUSCULAR | Status: DC | PRN
  Administered 2022-11-28 (×2): 25 ug via INTRAVENOUS

## 2022-11-28 MED ORDER — CHLORHEXIDINE GLUCONATE 0.12 % MT SOLN: 15.0000 mL | Freq: Once | OROMUCOSAL | Status: AC

## 2022-11-28 MED ORDER — DEXAMETHASONE SODIUM PHOSPHATE 10 MG/ML IJ SOLN
INTRAMUSCULAR | Status: DC | PRN
  Administered 2022-11-28: 5 mg via INTRAVENOUS

## 2022-11-28 NOTE — Discharge Instructions (Signed)

## 2022-11-28 NOTE — Telephone Encounter (Signed)
CT was done for mapping purposes for robotic bronchoscopy.  I have reviewed the film.  Patient already had bronchoscopy.  Nothing further needed.

## 2022-11-28 NOTE — Telephone Encounter (Signed)
Received call report from [place] with [facility] radiology on CT super D.   Dr. Patsey Berthold, please advise. Thanks

## 2022-11-28 NOTE — Anesthesia Postprocedure Evaluation (Signed)
Anesthesia Post Note  Patient: Veronica Boyd  Procedure(s) Performed: ROBOTIC ASSISTED NAVIGATIONAL BRONCHOSCOPY (Right)  Patient location during evaluation: PACU Anesthesia Type: General Level of consciousness: awake and alert Pain management: pain level controlled Vital Signs Assessment: post-procedure vital signs reviewed and stable Respiratory status: spontaneous breathing, nonlabored ventilation, respiratory function stable and patient connected to nasal cannula oxygen Cardiovascular status: blood pressure returned to baseline and stable Postop Assessment: no apparent nausea or vomiting Anesthetic complications: no   No notable events documented.   Last Vitals:  Vitals:   11/28/22 1425 11/28/22 1430  BP: (!) 90/58 (!) 94/54  Pulse: 86 82  Resp: 19 19  Temp: (!) 36.1 C   SpO2: 100% 100%    Last Pain:  Vitals:   11/28/22 1430  TempSrc:   PainSc: 0-No pain                 Ilene Qua

## 2022-11-28 NOTE — Interval H&P Note (Signed)
Veronica Boyd has presented today for surgery, with the diagnosis of RIGHT lower lobe lung nodule/mass.  The various methods of treatment have been discussed with the patient and family. After consideration of risks, benefits and other options for treatment, the patient has consented to  Procedure(s): ROBOTIC ASSISTED NAVIGATIONAL BRONCHOSCOPY-RIGHT as a surgical intervention.  The patient's history has been reviewed, patient examined, no change in status, stable for surgery.  I have reviewed the patient's chart and labs.  Questions were answered to the patient's satisfaction.  Benefits, limitations and potential complications of the procedure were discussed with the patient/family.  Complications from bronchoscopy are rare and most often minor, but if they occur they may include breathing difficulty, vocal cord spasm, hoarseness, slight fever, vomiting, dizziness, bronchospasm, infection, low blood oxygen, bleeding from biopsy site, or an allergic reaction to medications.  It is uncommon for patients to experience other more serious complications for example: Collapsed lung requiring chest tube placement, respiratory failure, heart attack and/or cardiac arrhythmia.  Patient agreed to proceed  C. Derrill Kay, MD Advanced Bronchoscopy PCCM [facility]    *This note was dictated using voice recognition software/Dragon.  Despite best efforts to proofread, errors can occur which can change the meaning. Any transcriptional errors that result from this process are unintentional and may not be fully corrected at the time of dictation.

## 2022-11-28 NOTE — Transfer of Care (Signed)
Immediate Anesthesia Transfer of Care Note  Patient: Veronica Boyd  Procedure(s) Performed: ROBOTIC ASSISTED NAVIGATIONAL BRONCHOSCOPY (Right)  Patient Location: PACU  Anesthesia Type:General  Level of Consciousness: awake  Airway & Oxygen Therapy: Patient Spontanous Breathing and Patient connected to face mask oxygen  Post-op Assessment: Report given to RN and Post -op Vital signs reviewed and stable  Post vital signs: Reviewed  Last Vitals:  Vitals Value Taken Time  BP 90/58 11/28/22 1425  Temp 97.99F   Pulse 82 11/28/22 1429  Resp 33 11/28/22 1429  SpO2 100 % 11/28/22 1429  Vitals shown include unvalidated device data.  Last Pain:  Vitals:   11/28/22 1125  TempSrc: Oral  PainSc: 0-No pain         Complications: No notable events documented.

## 2022-11-28 NOTE — Op Note (Signed)
PROCEDURES:  Robotic assisted bronchoscopy Cellvizio probe based confocal laser endomicroscopy (pCLE) Augmented fluoroscopy    Indication: 77 year old lifelong never smoker with enlarging right lower lobe nodule in the setting of stage IV rectal cancer and history of follicular lymphoma.  Preoperative Diagnosis: Enlarging right lower lobe nodule, rule out cancer Post Procedure Diagnosis: Same as above Consent: Verbal/Written: obtained  Benefits, limitations and potential complications of the procedure were discussed with the patient/family.  Complications from bronchoscopy are rare and most often minor, but if they occur they may include breathing difficulty, vocal cord spasm, hoarseness, slight fever, vomiting, dizziness, bronchospasm, infection, low blood oxygen, bleeding from biopsy site, or an allergic reaction to medications.  It is uncommon for patients to experience other more serious complications for example: Collapsed lung requiring chest tube placement, respiratory failure, heart attack and/or cardiac arrhythmia.  Patient understood the potential complications and agreed to proceed.  Surgeon: Renold Don, MD Assistant/Scrub: Liborio Nixon, RRT Circulator: N/A Anesthesiologist/CRNA: Darrin Nipper, MD/Sonia Kaleen Mask, CRNA Cytotechnology: Maryan Puls, team lead  Fluoroscopy technician: Gerhard Munch, RT Representatives: N/A  Type of Anesthesia: General endotracheal  Procedures Performed:   Robotic bronchoscopy: Procedure consists of robotic navigation comprised of electromagnetics, optical pattern recognition and robotic kinematic data - to triangulate bronchoscope location during the procedure and provide accurate positional data to biopsy a lesion. Cellvizio probe based confocal laser endomicroscopy (pCLE) utilizing blue laser endomicroscopy. Augmented fluoroscopy with Body Vision.  Description of Procedure:  Robotic bronchoscopy: The patient was brought to Procedure  Room 2 (Bronchoscopy Suite) in the OR area where appropriate timeout was taken with the staff after the patient was inducted under general anesthesia.  The patient was inducted under general anesthesia and intubated by the anesthesia team.  Patient was intubated with a #8 ET tube without difficulty.  Tube was secured at 4 cm above the carina.  A Portex adapter was placed on the ET tube flange.  Once the patient was under adequate general anesthesia the Olympus therapeutic video bronchoscope was advanced and an anatomic airway tour and surveillance bronchoscopy was performed.The distal trachea appeared unremarkable. The main carina was sharp.  There were some thick tenacious secretions noted on the right mainstem bronchus, these were lavaged and suctioned until cleared. The RUL, RLL,appeared to be free of endobronchial masses, lesions, or purulent secretions.  The RML showed that the lateral component was patent but the most medial component still had some mucous plugging and mild stenosis.  Using a biopsy forcep the medial RML bronchus was noted to be patent once debris was removed.  This area was lavaged and suctioned until cleared.  The LLL/LUL appeared to be free of endobronchial masses, lesions, or purulent secretions.  Once the survey bronchoscopy was completed, registration for the augmented fluoroscopy (Body Vision) was then performed with the fluoroscopic C arm.  Once this was completed, the robotic bronchoscope ET tube adapter was placed and ETT was cut to proper length and secured on the mid plane.  The [facility] robotic scope was then advanced through the ETT and registration was performed successfully.  The correlation between the robotic mapping and bronchoscopic mapping was somewhat skewed due to the segmentation being poor on the right lower lobe likely from prior radiation therapy and stenotic airways. With the assistance of fused navigation, the bronchoscope was advanced to the RLL nodule/mass. The  tip of the working channel sat in alignment with the nodule  Positioning was confirmed with augmented fluoroscopy.  At this point Cellvizio probe based confocal laser  endomicroscopy (pCLE) was utilized to confirm target acquisition.  The images through [facility] were consistent with the targeted area   Augmented fluoroscopy via Body Vision was utilized to optimize the position most favorable for biopsies, then the robotic bronchoscope was anchored to maintain position.  8 transbronchial biopsies were obtained at this point, the lesion could be seen moving during biopsies.  ROSE was utilized to evaluate 2 of the biopsy specimens that showed atypia and bizarre cells that could not be well-characterized.  After confirmation of excellent hemostasis, 2 passes with a cytology brush on the RLL nodule/mass were performed, the brushes were cut into CytoLyt preservative.  An Olympus Peri View needle lies to make 2 passes.  A BAL was also obtained at this segment, which sent for cytology and flow cytometry analysis.  For BAL sample acquisition purposes, 20 ml of normal saline were instilled, and approximately 6 mL ml were recovered/trapped and sent for flow cytometry analysis.  A second aliquot of 20 mL was then instilled yielding 5 mL of aliquot.  This was sent for cytology.  The robotic bronchoscope was then retracted all the way out after confirmation of excellent hemostasis.  At this point the Olympus video bronchoscope was then reintroduced through the existing ET tube.  Examination of the airway revealed excellent hemostasis.  Reexamination of the right tracheobronchial tree confirmed significant stenotic airways on the right lower lobe and right middle lobe.  At this point having completed the secondary survey the patient received bronchial lavage with 9 mL of 1% lidocaine via the ET tube. No significant bleeding was observed at the conclusion of the procedure.  The bronchoscope was then removed.  At this point, the  patient was allowed to emerge from general anesthesia, and was extubated in the procedure room without incident.  The patient  was taken to the PACU in satisfactory condition.  Auscultation of the lungs showed no change from pre bronchoscopy examination.  Patient tolerated the procedure very well with no untoward effects of anesthesia noted.   Specimens Obtained:  Transbronchial Forceps Biopsy: X 8, RLL  Transbronchial Brush: X 2, RLL  Transbronchial Needle Aspirate: X 2, RLL  Targeted BAL: X 2, RLL.  First aliquot 6 mL, second aliquot 5 mL.  Fluoroscopy: Augmented fluoroscopy (Body Vision) was utilized during the course of this procedure to assure that biopsies were taken in a safe manner under fluoroscopic guidance with spot films required.  Total fluoroscopy time: 4 minutes 5 seconds, total dose: 24.76 mGy.  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 Derrill Kay, MD Advanced Bronchoscopy PCCM [facility]    *This note was dictated using voice recognition software/Dragon.  Despite best efforts to proofread, errors can occur which can change the meaning.  Any change was purely unintentional.

## 2022-11-28 NOTE — Telephone Encounter (Signed)
Noted  

## 2022-11-28 NOTE — Anesthesia Preprocedure Evaluation (Addendum)
Anesthesia Evaluation  Patient identified by MRN, date of birth, ID band Patient awake    Reviewed: Allergy & Precautions, NPO status , Patient's Chart, lab work & pertinent test results  History of Anesthesia Complications Negative for: history of anesthetic complications  Airway Mallampati: II   Neck ROM: Full    Dental  (+) Missing   Pulmonary neg pulmonary ROS    + decreased breath sounds (in right lower fields)      Cardiovascular Normal cardiovascular exam Rhythm:Regular Rate:Normal  ECG 09/19/22:  Normal sinus rhythm Possible Left atrial enlargement   Neuro/Psych  Neuromuscular disease (neuropathy)    GI/Hepatic ,GERD  ,,Rectal CA   Endo/Other  negative endocrine ROS    Renal/GU negative Renal ROS     Musculoskeletal  (+) Arthritis ,    Abdominal   Peds  Hematology  (+) Blood dyscrasia, anemia Lymphoma 2017   Anesthesia Other Findings Severe protein calorie malnutrition  Reproductive/Obstetrics                             Anesthesia Physical Anesthesia Plan  ASA: 4  Anesthesia Plan: General   Post-op Pain Management:    Induction: Intravenous  PONV Risk Score and Plan: 3 and Ondansetron, Dexamethasone and Treatment may vary due to age or medical condition  Airway Management Planned: Oral ETT  Additional Equipment:   Intra-op Plan:   Post-operative Plan: Extubation in OR  Informed Consent: I have reviewed the patients History and Physical, chart, labs and discussed the procedure including the risks, benefits and alternatives for the proposed anesthesia with the patient or authorized representative who has indicated his/her understanding and acceptance.     Dental advisory given  Plan Discussed with: CRNA  Anesthesia Plan Comments: (Patient consented for risks of anesthesia including but not limited to:  - adverse reactions to medications - damage to eyes,  teeth, lips or other oral mucosa - nerve damage due to positioning  - sore throat or hoarseness - damage to heart, brain, nerves, lungs, other parts of body or loss of life  Informed patient about role of CRNA in peri- and intra-operative care.  Patient voiced understanding.)       Anesthesia Quick Evaluation

## 2022-11-29 ENCOUNTER — Encounter: Payer: Self-pay | Admitting: Pulmonary Disease

## 2022-11-29 LAB — CYTOLOGY - NON PAP

## 2022-11-29 LAB — SURGICAL PATHOLOGY

## 2022-11-30 ENCOUNTER — Other Ambulatory Visit: Payer: Self-pay

## 2022-11-30 DIAGNOSIS — R0602 Shortness of breath: Secondary | ICD-10-CM

## 2022-11-30 MED ORDER — AMOXICILLIN-POT CLAVULANATE 500-125 MG PO TABS: 1.0000 | ORAL_TABLET | Freq: Two times a day (BID) | ORAL | 0 refills | Status: DC

## 2022-11-30 MED ORDER — METHYLPREDNISOLONE 4 MG PO TBPK: ORAL_TABLET | ORAL | 0 refills | Status: DC

## 2022-12-05 ENCOUNTER — Inpatient Hospital Stay: Payer: 59

## 2022-12-05 ENCOUNTER — Encounter: Payer: Self-pay | Admitting: Oncology

## 2022-12-05 ENCOUNTER — Inpatient Hospital Stay (HOSPITAL_BASED_OUTPATIENT_CLINIC_OR_DEPARTMENT_OTHER): Payer: 59 | Admitting: Oncology

## 2022-12-05 VITALS — BP 108/74 | HR 92 | Temp 98.4°F | Resp 16 | Ht <= 58 in | Wt 88.0 lb

## 2022-12-05 DIAGNOSIS — C20 Malignant neoplasm of rectum: Secondary | ICD-10-CM | POA: Diagnosis not present

## 2022-12-05 DIAGNOSIS — R0609 Other forms of dyspnea: Secondary | ICD-10-CM | POA: Diagnosis not present

## 2022-12-05 LAB — CBC WITH DIFFERENTIAL/PLATELET
Abs Immature Granulocytes: 0.03 10*3/uL (ref 0.00–0.07)
Basophils Absolute: 0 10*3/uL (ref 0.0–0.1)
Basophils Relative: 0 %
Eosinophils Absolute: 0 10*3/uL (ref 0.0–0.5)
Eosinophils Relative: 0 %
HCT: 42.4 % (ref 36.0–46.0)
Hemoglobin: 13.8 g/dL (ref 12.0–15.0)
Immature Granulocytes: 0 %
Lymphocytes Relative: 28 %
Lymphs Abs: 2.2 10*3/uL (ref 0.7–4.0)
MCH: 31 pg (ref 26.0–34.0)
MCHC: 32.5 g/dL (ref 30.0–36.0)
MCV: 95.3 fL (ref 80.0–100.0)
Monocytes Absolute: 1.1 10*3/uL — ABNORMAL HIGH (ref 0.1–1.0)
Monocytes Relative: 14 %
Neutro Abs: 4.5 10*3/uL (ref 1.7–7.7)
Neutrophils Relative %: 58 %
Platelets: 269 10*3/uL (ref 150–400)
RBC: 4.45 MIL/uL (ref 3.87–5.11)
RDW: 14.7 % (ref 11.5–15.5)
WBC: 7.9 10*3/uL (ref 4.0–10.5)
nRBC: 0 % (ref 0.0–0.2)

## 2022-12-05 LAB — COMPREHENSIVE METABOLIC PANEL
ALT: 13 U/L (ref 0–44)
AST: 20 U/L (ref 15–41)
Albumin: 3.9 g/dL (ref 3.5–5.0)
Alkaline Phosphatase: 60 U/L (ref 38–126)
Anion gap: 10 (ref 5–15)
BUN: 16 mg/dL (ref 8–23)
CO2: 26 mmol/L (ref 22–32)
Calcium: 9.1 mg/dL (ref 8.9–10.3)
Chloride: 100 mmol/L (ref 98–111)
Creatinine, Ser: 0.71 mg/dL (ref 0.44–1.00)
GFR, Estimated: >60 mL/min (ref >60)
Glucose, Bld: 86 mg/dL (ref 70–99)
Potassium: 3.8 mmol/L (ref 3.5–5.1)
Sodium: 136 mmol/L (ref 135–145)
Total Bilirubin: 0.6 mg/dL (ref 0.3–1.2)
Total Protein: 7.3 g/dL (ref 6.5–8.1)

## 2022-12-05 NOTE — Progress Notes (Signed)
[place]  [phone] [phone]  ID: Veronica Boyd OB: June 05, 1946  MR#: [id number]  ASN#:[id number]  Patient Care Team: Tracie Harrier, MD as PCP - General (Internal Medicine) Lloyd Huger, MD as Consulting Physician (Oncology) [name], MD as Referring Physician (Radiation Oncology) Jacquelin Hawking, NP as Nurse Practitioner (Oncology) Clent Jacks, RN as Oncology Nurse Navigator Tyler Pita, MD as Consulting Physician (Pulmonary Disease)  CHIEF COMPLAINT: Stage IV rectal cancer, history of follicular lymphoma.  INTERVAL HISTORY: Patient returns to clinic today for further evaluation and discussion of her biopsy results.  Her shortness of breath and cough have significantly improved.  She does not complain of weakness or fatigue today.  She continues to have a chronic peripheral neuropathy.  She has no other neurologic complaints.  She denies any recent fevers or illnesses.  She denies any chest pain or hemoptysis.  She has a chronic cough.  She denies any nausea, vomiting, constipation or diarrhea.  She has no urinary complaints.  Patient offers no further specific complaints today.  REVIEW OF SYSTEMS:   Review of Systems  Constitutional: Negative.  Negative for fever, malaise/fatigue and weight loss.  Respiratory: Negative.  Negative for cough, hemoptysis and shortness of breath.   Cardiovascular: Negative.  Negative for chest pain and leg swelling.  Gastrointestinal:  Negative for abdominal pain, blood in stool, diarrhea, melena, nausea and vomiting.  Genitourinary: Negative.  Negative for dysuria.  Musculoskeletal: Negative.  Negative for back pain, joint pain and neck pain.  Skin: Negative.  Negative for rash.  Neurological:  Positive for tingling and sensory change. Negative for focal weakness, weakness and headaches.  Psychiatric/Behavioral: Negative.  The patient is not nervous/anxious and does not have  insomnia.     As per HPI. Otherwise, a complete review of systems is negative.   PAST MEDICAL HISTORY: Past Medical History:  Diagnosis Date   Anemia due to antineoplastic chemotherapy    Arthritis    Bronchial obstruction    Cataract    bilateral   Chemotherapy-induced neuropathy (HCC)    Chicken pox    Colon polyp    Dyspnea    Follicular lymphoma ([place]) 08/2016   lymph nodes    GERD (gastroesophageal reflux disease)    Hyperlipidemia    Lung nodule    Lung nodules 09/2022   right upper lobe   Mass of right chest wall    Osteoporosis    Protein-calorie malnutrition, severe (HCC)    Rectal carcinoma (HCC)    Thrombocytopenia ([place])     PAST SURGICAL HISTORY: Past Surgical History:  Procedure Laterality Date   AXILLARY LYMPH NODE DISSECTION Right 08/21/2016   Procedure: AXILLARY LYMPH NODE excision;  Surgeon: Leonie Green, MD;  Location: [facility] OR[facility];  Service: General;  Laterality: Right;   CATARACT EXTRACTION, BILATERAL Bilateral    COLONOSCOPY N/A 09/19/2020   Procedure: COLONOSCOPY;  Surgeon: Lesly Rubenstein, MD;  Location: [facility];  Service: Endoscopy;  Laterality: N/A;   PORTA CATH INSERTION N/A 09/16/2017   Procedure: PORTA CATH INSERTION;  Surgeon: Algernon Huxley, MD;  Location: [facility];  Service: Cardiovascular;  Laterality: N/A;   TONSILLECTOMY     TOTAL HIP ARTHROPLASTY Left 1992    FAMILY HISTORY: Family History  Problem Relation Age of Onset   Diabetes Sister    Lung cancer Brother    Diabetes Brother    Basal cell carcinoma Daughter    Breast cancer Paternal Aunt  ADVANCED DIRECTIVES (Y/N):  N  HEALTH MAINTENANCE: Social History   Tobacco Use   Smoking status: Never   Smokeless tobacco: Never  Vaping Use   Vaping Use: Never used  Substance Use Topics   Alcohol use: No   Drug use: No     Colonoscopy:  PAP:  Bone density:  Lipid panel:  No Known Allergies  Current Outpatient Medications  Medication  Sig Dispense Refill   amoxicillin-clavulanate (AUGMENTIN) 500-125 MG tablet Take 1 tablet by mouth in the morning and at bedtime. 20 tablet 0   diclofenac sodium (VOLTAREN) 1 % GEL Apply 2 g topically 2 (two) times daily as needed.     diphenoxylate-atropine (LOMOTIL) 2.5-0.025 MG tablet TAKE 2 TABLETS BY MOUTH 4 (FOUR) TIMES DAILY AS NEEDED FOR DIARRHEA OR LOOSE STOOLS. (Patient taking differently: Take 2 tablets by mouth every morning.) 60 tablet 1   gabapentin (NEURONTIN) 300 MG capsule TAKE 1 CAPSULE BY MOUTH TWICE A DAY (Patient taking differently: Take 300 mg by mouth as needed.) 60 capsule 2   methylPREDNISolone (MEDROL DOSEPAK) 4 MG TBPK tablet Use as directed. 21 each 0   Multiple Vitamin (MULTIVITAMIN) capsule Take 1 capsule by mouth daily.     pantoprazole (PROTONIX) 20 MG tablet Take 1 tablet by mouth daily as needed for heartburn.     Potassium 99 MG TABS Take 1 tablet by mouth as needed.     simvastatin (ZOCOR) 20 MG tablet Take 20 mg by mouth at bedtime.     tiZANidine (ZANAFLEX) 2 MG tablet TAKE 1 TABLET BY MOUTH NIGHTLY AS NEEDED FOR MUSCLE SPASMS. (Patient taking differently: Take 2 mg by mouth at bedtime as needed.) 90 tablet 1   traMADol-acetaminophen (ULTRACET) 37.5-325 MG tablet Take 1 tablet by mouth every 8 (eight) hours as needed.     potassium chloride (KLOR-CON M) 10 MEQ tablet Take 1 tablet (10 mEq total) by mouth daily. (Patient not taking: Reported on 11/23/2022) 14 tablet 0   No current facility-administered medications for this visit.   Facility-Administered Medications Ordered in Other Visits  Medication Dose Route Frequency Provider Last Rate Last Admin   heparin lock flush 100 unit/mL  500 Units Intravenous Once Grayland Ormond, Kathlene November, MD       heparin lock flush 100 unit/mL  500 Units Intracatheter PRN Grayland Ormond, Kathlene November, MD       heparin lock flush 100 unit/mL  500 Units Intravenous Once Lloyd Huger, MD       heparin lock flush 100 unit/mL  500 Units  Intravenous Once Lloyd Huger, MD       sodium chloride flush (NS) 0.9 % injection 10 mL  10 mL Intravenous PRN Lloyd Huger, MD   10 mL at 12/04/17 0901   sodium chloride flush (NS) 0.9 % injection 10 mL  10 mL Intracatheter PRN Lloyd Huger, MD       sodium chloride flush (NS) 0.9 % injection 10 mL  10 mL Intravenous PRN Lloyd Huger, MD   10 mL at 04/24/21 0839   yttrium-90 injection 50.3 millicurie  54.6 millicurie Intravenous Once Chrystal, Eulas Post, MD        OBJECTIVE: Vitals:   12/05/22 1036  BP: 108/74  Pulse: 92  Resp: 16  Temp: 98.4 F (36.9 C)  SpO2: 96%      Body mass index is 18.39 kg/m.    ECOG FS:0 - Asymptomatic  General: Well-developed, well-nourished, no acute distress. Eyes: Pink conjunctiva, anicteric sclera. HEENT:  Normocephalic, moist mucous membranes. Lungs: No audible wheezing or coughing. Heart: Regular rate and rhythm. Abdomen: Soft, nontender, no obvious distention. Musculoskeletal: No edema, cyanosis, or clubbing. Neuro: Alert, answering all questions appropriately. Cranial nerves grossly intact. Skin: No rashes or petechiae noted. Psych: Normal affect.  LAB RESULTS:  Lab Results  Component Value Date   NA 136 12/05/2022   K 3.8 12/05/2022   CL 100 12/05/2022   CO2 26 12/05/2022   GLUCOSE 86 12/05/2022   BUN 16 12/05/2022   CREATININE 0.71 12/05/2022   CALCIUM 9.1 12/05/2022   PROT 7.3 12/05/2022   ALBUMIN 3.9 12/05/2022   AST 20 12/05/2022   ALT 13 12/05/2022   ALKPHOS 60 12/05/2022   BILITOT 0.6 12/05/2022   GFRNONAA >60 12/05/2022   GFRAA >60 06/28/2020    Lab Results  Component Value Date   WBC 7.9 12/05/2022   NEUTROABS 4.5 12/05/2022   HGB 13.8 12/05/2022   HCT 42.4 12/05/2022   MCV 95.3 12/05/2022   PLT 269 12/05/2022     STUDIES: DG Chest Port 1 View  Result Date: 11/28/2022 CLINICAL DATA:  Status post bronchoscopy with biopsy. EXAM: PORTABLE CHEST 1 VIEW COMPARISON:  Two-view chest x-ray  11/13/2022 FINDINGS: The right IJ Port-A-Cath is accessed. The heart size is normal. Atherosclerotic calcifications present in the aorta. The right lower lobe pulmonary nodule is again seen. Right perihilar opacities have increased some which may represent hemorrhage following biopsy. No significant pneumothorax is present. No significant effusion is present. The left lung is clear. IMPRESSION: 1. No significant pneumothorax following bronchoscopy. 2. Right perihilar opacities have increased some which may represent some hemorrhage following biopsy. 3. Stable right lower lobe pulmonary nodule. Electronically Signed   By: San Morelle M.D.   On: 11/28/2022 14:54   DG C-Arm 1-60 Min-No Report  Result Date: 11/28/2022 Fluoroscopy was utilized by the requesting physician.  No radiographic interpretation.   CT SUPER D CHEST WO MONARCH PILOT  Result Date: 11/28/2022 CLINICAL DATA:  Lung nodule * Tracking Code: BO * EXAM: CT CHEST WITHOUT CONTRAST TECHNIQUE: Multidetector CT imaging of the chest was performed using thin slice collimation for electromagnetic bronchoscopy planning purposes, without intravenous contrast. RADIATION DOSE REDUCTION: This exam was performed according to the departmental dose-optimization program which includes automated exposure control, adjustment of the mA and/or kV according to patient size and/or use of iterative reconstruction technique. COMPARISON:  CT 11/13/2022 and older x-ray 11/13/2022 and older FINDINGS: Cardiovascular: Heart is nonenlarged. Trace pericardial fluid. Coronary artery calcifications are seen. The thoracic aorta on this non IV contrast exam as diffuse vascular calcifications. Nondilated. Right upper chest port identified with tip extending into the central SVC. Mediastinum/Nodes: No specific abnormal lymph node enlargement seen in the axillary region, hilum or mediastinum on this noncontrast examination. Normal caliber thoracic esophagus. Small thyroid  gland. Lungs/Pleura: Breathing motion seen. There are some emphysematous lung changes. The left lung has some lingular bronchiectasis, atelectasis and scarring. No frank consolidation, pneumothorax or effusion. As seen on the prior is a macrolobular mass in the lower lobe abutting the major fissure. On the most recent prior of earlier January 2024 this measured 2.3 x 1.8 cm in the axial plane. In the axial plane today on series 3, image 98 this measures 2.3 by 1.9 cm. Cephalocaudal plane on series 6, image 56 measures 1.8 cm. On the study from November 2023 lesion measured only 16 x 13 mm, significant interval growth. The nodule medial to this on the study of November  2023 abutting the right side of the heart, pericardium is not seen on the current examination. The lesion seen posterior to this also in the right lower lobe just above the diaphragm is also no longer seen. There are increasing areas of bandlike opacity in this area of the right lower lobe. The more bandlike areas extending superior towards the superior segment and perihilar are stable. The anatomy of the right lower lobe is slightly different today than the previous exams with distortion. Developing areas of opacity along bronchi in this location as well such as seen axial image 89 of series 3. There is some improving aeration and opacity involving the middle lobe including less bronchial opacity. There are some residual areas of middle lobe bronchiectasis with as well inferior right upper lobe bronchiectasis. Peripheral areas of pleural thickening with scarring and fibrotic changes in the lateral right upper lobe as well. Overall there is volume loss of the right hemithorax. Please correlate with known history of radiation. Upper Abdomen: Adrenal glands are preserved in the upper abdomen. Musculoskeletal: Artifact from the patient's arms being at the patient's side. There is pectus excavatum with mass effect along the heart and mediastinum.  Degenerative changes are seen in the spine. IMPRESSION: Dominant right lower lobe nodule is significantly increased in size going back to November 2023 when measured in the same fashion worrisome for worsening neoplasm. The smaller areas of nodularity in the right lower lobe on the study of November 2023 are no longer identified with some increasing adjacent bandlike opacity in this location. Please correlate for any intervention. There are areas of opacity along the associated bronchi in this location Improving opacity and bronchial opacification along the middle lobe. No developing new lymph node enlargement in the thorax. Pectus excavatum. Chest port Aortic Atherosclerosis (ICD10-I70.0) and Emphysema (ICD10-J43.9). A call report will be made to the ordering service by the Radiology physician assistant team. Electronically Signed   By: Jill Side M.D.   On: 11/28/2022 13:15   CT Chest W Contrast  Result Date: 11/13/2022 CLINICAL DATA:  Follow-up lung nodules. History of rectal carcinoma and grade 1 follicular lymphoma. Never smoker. * Tracking Code: BO * EXAM: CT CHEST WITH CONTRAST TECHNIQUE: Multidetector CT imaging of the chest was performed during intravenous contrast administration. RADIATION DOSE REDUCTION: This exam was performed according to the departmental dose-optimization program which includes automated exposure control, adjustment of the mA and/or kV according to patient size and/or use of iterative reconstruction technique. CONTRAST:  3m OMNIPAQUE IOHEXOL 300 MG/ML  SOLN COMPARISON:  None Available. FINDINGS: Cardiovascular: Port in the anterior chest wall with tip in distal SVC. Coronary artery calcification and aortic atherosclerotic calcification. Mediastinum/Nodes: No axillary or supraclavicular adenopathy. No mediastinal or hilar adenopathy. No pericardial fluid. Esophagus normal. RIGHT infrahilar peribronchial thickening is similar prior. Lungs/Pleura: RIGHT lobe pulmonary nodule  measures 23 x 18 mm (image 89/3) which compares to 16 mm x 13 mm. Band of parenchymal peribronchial thickening in the RIGHT lower lobe is unchanged from prior. RIGHT upper lobe subpleural interstitial thickening suggest radiation change in is stable. Upper Abdomen: Limited view of the liver, kidneys, pancreas are unremarkable. Normal adrenal glands. Musculoskeletal: No aggressive osseous lesion. IMPRESSION: 1. Interval increase in size of RIGHT lower lobe pulmonary nodule. Findings concerning for progression of malignancy. Consider tissue sampling or FDG PET scan. 2. No mediastinal lymphadenopathy. 3. Stable radiation change in the RIGHT upper lobe and RIGHT lower lobe. 4.  Aortic Atherosclerosis (ICD10-I70.0). Electronically Signed   By: SNicole Kindred  Leonia Reeves M.D.   On: 11/13/2022 13:44   DG Chest 2 View  Result Date: 11/13/2022 CLINICAL DATA:  Shortness of breath for 2 weeks. Known pulmonary nodules. History of non-small cell lung cancer. EXAM: CHEST - 2 VIEW COMPARISON:  Chest x-ray 10/01/2022 FINDINGS: The right IJ power port is stable. The cardiac silhouette, mediastinal and hilar contours are stable. Stable tortuosity and calcification of the thoracic aorta. 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 Enlarging right lower lobe pulmonary nodule measuring 2 cm. It previously measured 1.6 cm. No pleural effusions, pulmonary edema or acute pulmonary infiltrates. The bony thorax is intact. Stable pectus deformity. IMPRESSION: 1. Enlarging right lower lobe pulmonary nodule. 2. Stable post treatment changes in the right hilar region. 3. No acute pulmonary findings. Electronically Signed   By: Marijo Sanes M.D.   On: 11/13/2022 10:14    ONCOLOGY HISTORY:  Biopsy confirmed rectal cancer.  MRI completed at [facility] on October 11, 2020 revealed extension of the tumor beyond the wall into the rectovaginal recess with suspected invasion of the vaginal wall posteriorly.  Tumor  also appears to involve the internal anal sphincter.  There are also numerous prominent presacral and mesorectal lymph nodes highly suspicious for malignancy.  PET scan results from November 17, 2020 reviewed independently with 3 hypermetabolic right lower lobe lung nodules consistent with pulmonary metastasis.  Patient completed cycle 8 of FOLFOX on Mar 29, 2021.  She then completed cycle 5 of her 5-FU pump on May 26, 2021 and XRT on June 01, 2021.  PET scan results from July 17, 2021 reviewed independently with no evidence of disease other than enlarging right lower lobe lung lesion.  Patient completed SBRT to the right lung lesion on August 22, 2021.  Repeat PET scan on July 25, 2021 revealed minimal residual disease in the rectum and likely resolution of lesions in the lung.    ASSESSMENT: Stage IV rectal cancer, history of follicular lymphoma. PLAN:    1.  Stage IV rectal cancer: See oncology history as above.  CEA is within normal limits.  Recent CT scan and PET scan results noted.  Bronchoscopy and biopsy only revealed inflammatory tissue with collapsed lung which could possibly give a false positive on PET scan.  No malignancy was identified.  Repeat bronchoscopy and biopsy continue to show no evidence of disease.  No intervention is needed at this time.  Patient has been instructed to keep her follow-up appointment with pulmonary at the end of February.  Return to clinic in 3 months for further evaluation.  Will likely repeat imaging in June 2024.   2.  Pulmonary nodule: Imaging and biopsy results as above.  No malignancy identified.  3. Recurrent follicular lymphoma: CT scan results from December 23, 2019 reviewed independently with no obvious evidence of recurrent or progressive disease.  PET scan results as above consistent with rectal cancer metastasis and no evidence of lymphoma.   Patient received Zevalin on July 30, 2017 with not much therapeutic effect.  She subsequently  underwent cycles 5 of R-CHOP chemotherapy with Neulasta support completing on December 12, 2017.  Patient continues to be in complete remission.  4.  Neuropathy: Chronic and unchanged.   Patient expressed understanding and was in agreement with this plan. She also understands that She can call clinic at any time with any questions, concerns, or complaints.    Cancer Staging  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 [facility]) Staging  form: Lymphoid Neoplasms, AJCC 6th Edition - Clinical stage from 08/27/2016: Stage IIE - Signed by Lloyd Huger, MD on 08/27/2016  Rectal cancer [facility]) Staging form: Colon and Rectum, AJCC 8th Edition - Clinical: Stage IVA Larwance Sachs, Noelle.Marry) - Signed by Lloyd Huger, MD on 11/02/2020   Lloyd Huger, MD   12/05/2022 11:59 AM

## 2022-12-06 ENCOUNTER — Other Ambulatory Visit: Payer: 59

## 2022-12-06 LAB — CEA: CEA: 10.9 ng/mL — ABNORMAL HIGH (ref 0.0–4.7)

## 2022-12-07 ENCOUNTER — Other Ambulatory Visit: Payer: 59

## 2022-12-11 ENCOUNTER — Ambulatory Visit: Payer: 59 | Admitting: Oncology

## 2023-01-03 ENCOUNTER — Ambulatory Visit (INDEPENDENT_AMBULATORY_CARE_PROVIDER_SITE_OTHER): Payer: 59 | Admitting: Pulmonary Disease

## 2023-01-03 ENCOUNTER — Ambulatory Visit
Admission: RE | Admit: 2023-01-03 | Discharge: 2023-01-03 | Disposition: A | Discharge status: Home / Self Care | Payer: 59 | Attending: Pulmonary Disease | Admitting: Pulmonary Disease

## 2023-01-03 ENCOUNTER — Encounter: Payer: Self-pay | Admitting: Pulmonary Disease

## 2023-01-03 ENCOUNTER — Ambulatory Visit
Admission: RE | Admit: 2023-01-03 | Discharge: 2023-01-03 | Disposition: A | Discharge status: Home / Self Care | Payer: 59 | Source: Ambulatory Visit | Attending: Pulmonary Disease | Admitting: Pulmonary Disease

## 2023-01-03 VITALS — BP 118/78 | HR 110 | Temp 98.1°F | Ht <= 58 in | Wt 91.0 lb

## 2023-01-03 DIAGNOSIS — C8208 Follicular lymphoma grade I, lymph nodes of multiple sites: Secondary | ICD-10-CM

## 2023-01-03 DIAGNOSIS — C20 Malignant neoplasm of rectum: Secondary | ICD-10-CM

## 2023-01-03 DIAGNOSIS — R0602 Shortness of breath: Secondary | ICD-10-CM

## 2023-01-03 DIAGNOSIS — R911 Solitary pulmonary nodule: Secondary | ICD-10-CM

## 2023-01-03 DIAGNOSIS — J9809 Other diseases of bronchus, not elsewhere classified: Secondary | ICD-10-CM | POA: Diagnosis not present

## 2023-01-03 DIAGNOSIS — R052 Subacute cough: Secondary | ICD-10-CM

## 2023-01-03 NOTE — Progress Notes (Signed)
Subjective:    Patient ID: Veronica Boyd, female    DOB: August 23, 1946, 77 y.o.   MRN: [id number] Patient Care Team: Tracie Harrier, MD as PCP - General (Internal Medicine) Lloyd Huger, MD as Consulting Physician (Oncology) [name], MD as Referring Physician (Radiation Oncology) Jacquelin Hawking, NP as Nurse Practitioner (Oncology) Clent Jacks, RN as Oncology Nurse Navigator Tyler Pita, MD as Consulting Physician (Pulmonary Disease)  Chief Complaint  Patient presents with   Follow-up    Bronch on 11/28/2022    HPI Veronica Boyd is a 77 year old lifelong never smoker who follows up for pulmonary nodules in the setting of stage IV rectal cancer and a history of follicular lymphoma. The patient had undergone SBRT x 2 to her right lower lobe and right midlung due to what where apparent metastatic deposits.  She has also previously completed concurrent chemoradiation therapy as well as FOLFOX for locally advanced rectal cancer. The patient underwent bronchoscopy on 01 October 2022 this showed that the patient had total occlusion of the right middle lobe medial and lateral (B4/B5) subsegments with gelatinous inflammatory debris, the formal pathology revealed intense inflammatory change and calcifications.  Of note the patient had had issues with fractured teeth and there is a concern for aspiration of the tooth remnants.  The patient had experienced intractable cough prior to the procedure. Since that bronchoscopy she has had marked improvement on her cough and now this has completely resolved.  She occasionally had difficulty taking a deep breath but this has also improved from prior.  She had a normal swallow eval on 27 December.  I suspect that her aspiration of chipped teeth occurred during sleep and patient was unaware.  This issue added complexity to her bronchoscopic procedure as it distorted her airway.  A follow-up CT chest on 9 January that showed that 2  of the nodules noted previously had resolved, her right middle lobe collapse also had resolved but the index nodule in the right lower lobe had increased in size to 2.3 cm.  She underwent repeat bronchoscopy on 28 November 2022.  This showed a more patent right lower lobe however the needle subsegment had still some mucous plugging and debris and this was lavaged and suctioned till cleared.  Showed stenotic segments post radiation.  This also affected the segmentation tree for bronchoscopy.  However the lesion in question was reached and sampled and again sampling was consistent with intense inflammatory debris.  After discussion with Dr. Grayland Ormond the patient's oncologist, it was elected to treat the patient with antibiotics and follow-up with CT at a later time.  Since her procedure the patient has been doing well.  Her appetite is good.  She is in good spirits.  She has not had any hemoptysis.  She has not had any fevers, chills or sweats.  No chest pain. No dyspnea.   Previously, she had 12 teeth extracted due to being in poor repair and chipped.    Review of Systems A 10 point review of systems was performed and it is as noted above otherwise negative.  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 ([place]) 07/24/2016   Mass of right chest wall 06/06/2016   Primary  osteoarthritis involving multiple joints 12/14/2015   Transient insomnia 08/27/2015   Mixed hyperlipidemia 03/01/2015   Nocturnal muscle cramps 03/01/2015   Osteoporosis 11/08/2014   Primary osteoarthritis of both knees 07/05/2014   Hyperlipidemia 03/19/2014   Osteoarthritis of knee 03/19/2014   Abnormal mammogram 03/03/2013   Vitamin D deficiency 05/09/2012   Social History   Tobacco Use   Smoking status: Never   Smokeless tobacco: Never  Substance Use Topics   Alcohol use: No   No Known Allergies  Current Meds  Medication Sig   diclofenac sodium (VOLTAREN) 1 % GEL Apply 2 g topically 2 (two) times daily as needed.   diphenoxylate-atropine (LOMOTIL) 2.5-0.025 MG tablet TAKE 2 TABLETS BY MOUTH 4 (FOUR) TIMES DAILY AS NEEDED FOR DIARRHEA OR LOOSE STOOLS. (Patient taking differently: Take 2 tablets by mouth every morning.)   gabapentin (NEURONTIN) 300 MG capsule TAKE 1 CAPSULE BY MOUTH TWICE A DAY (Patient taking differently: Take 300 mg by mouth as needed.)   Multiple Vitamin (MULTIVITAMIN) capsule Take 1 capsule by mouth daily.   pantoprazole (PROTONIX) 20 MG tablet Take 1 tablet by mouth daily as needed for heartburn.   potassium chloride (KLOR-CON M) 10 MEQ tablet Take 1 tablet (10 mEq total) by mouth daily.   simvastatin (ZOCOR) 20 MG tablet Take 20 mg by mouth at bedtime.   tiZANidine (ZANAFLEX) 2 MG tablet TAKE 1 TABLET BY MOUTH NIGHTLY AS NEEDED FOR MUSCLE SPASMS. (Patient taking differently: Take 2 mg by mouth at bedtime as needed.)   traMADol-acetaminophen (ULTRACET) 37.5-325 MG tablet Take 1 tablet by mouth every 8 (eight) hours as needed.   Immunization History  Administered Date(s) Administered   Influenza-Unspecified 09/20/2014   PFIZER(Purple Top)SARS-COV-2 Vaccination 01/07/2020, 02/02/2020   Unspecified SARS-COV-2 Vaccination 02/02/2020   Zoster Recombinat (Shingrix) 11/29/2019, 03/20/2020   Zoster, Live 09/20/2014       Objective:   Physical Exam BP 118/78  (BP Location: Left Arm, Cuff Size: Normal)   Pulse (!) 110   Temp 98.1 F (36.7 C)   Ht '4\' 10"'$  (1.473 m)   Wt 91 lb (41.3 kg)   SpO2 98%   BMI 19.02 kg/m   SpO2: 98 % O2 Device: None (Room air)  GENERAL: Thin, elderly female, pale, fully ambulatory.  No conversational dyspnea. HEAD: Normocephalic, atraumatic.  EYES: Pupils equal, round, reactive to light.  No scleral icterus.  MOUTH: Poor dentition.  She has had 12 teeth extracted which were previously noted to be chipped and in poor repair. NECK: Supple. No thyromegaly. Trachea midline. No JVD.  No adenopathy. PULMONARY: Good air entry bilaterally.  No adventitious sounds. CARDIOVASCULAR: S1 and S2. Regular rate and rhythm.  No rubs, murmurs or gallops heard. ABDOMEN: Scaphoid otherwise benign. MUSCULOSKELETAL: No joint deformity, no clubbing, no edema.  NEUROLOGIC: Grossly nonfocal.  Fully ambulatory.  Speech is fluent. SKIN: Intact,warm,dry. PSYCH: Mood and behavior normal.  Chest x-ray today:  Shows relatively unchanged size of the right lower lobe pulmonary nodule.    Assessment & Plan:     ICD-10-CM   1. Lung nodule - RLL 2.3 cm  R91.1    Following expectantly Has upcoming CT chest abdomen pelvis    2.  Bronchial obstruction - RESOLVED  J98.09    Decidual mucus plug in the medial aspect of RML, cleared    3. Subacute cough - RESOLVED  R05.2    No recurrence    4. Rectal cancer ([place])  C20    This issue adds complexity to her management Follows with oncology    5. 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  Veronica Don, MD Advanced Bronchoscopy PCCM [facility]    *This note was dictated using voice recognition software/Dragon.  Despite best efforts to  proofread, errors can occur which can change the meaning. Any transcriptional errors that result from this process are unintentional and may not be fully corrected at the time of dictation.

## 2023-01-03 NOTE — Patient Instructions (Addendum)
Your chest x-ray today shows that the spot in your lung is still where.  Have to continue following this.    We will seeyou in follow-up in 2 to 3 months.

## 2023-01-29 ENCOUNTER — Other Ambulatory Visit: Payer: Self-pay | Admitting: Nurse Practitioner

## 2023-02-05 ENCOUNTER — Other Ambulatory Visit: Payer: Self-pay | Admitting: Oncology

## 2023-02-23 ENCOUNTER — Other Ambulatory Visit: Payer: Self-pay | Admitting: Oncology

## 2023-03-05 ENCOUNTER — Encounter: Payer: Self-pay | Admitting: Oncology

## 2023-03-05 ENCOUNTER — Inpatient Hospital Stay: Payer: 59 | Attending: Oncology | Admitting: Oncology

## 2023-03-05 DIAGNOSIS — C20 Malignant neoplasm of rectum: Secondary | ICD-10-CM | POA: Diagnosis not present

## 2023-03-05 DIAGNOSIS — Z803 Family history of malignant neoplasm of breast: Secondary | ICD-10-CM | POA: Insufficient documentation

## 2023-03-05 DIAGNOSIS — G629 Polyneuropathy, unspecified: Secondary | ICD-10-CM | POA: Diagnosis not present

## 2023-03-05 DIAGNOSIS — Z801 Family history of malignant neoplasm of trachea, bronchus and lung: Secondary | ICD-10-CM | POA: Diagnosis not present

## 2023-03-05 DIAGNOSIS — Z79899 Other long term (current) drug therapy: Secondary | ICD-10-CM | POA: Diagnosis not present

## 2023-03-05 DIAGNOSIS — C8200 Follicular lymphoma grade I, unspecified site: Secondary | ICD-10-CM | POA: Diagnosis not present

## 2023-03-05 DIAGNOSIS — C8208 Follicular lymphoma grade I, lymph nodes of multiple sites: Secondary | ICD-10-CM | POA: Diagnosis not present

## 2023-03-05 DIAGNOSIS — R102 Pelvic and perineal pain: Secondary | ICD-10-CM | POA: Diagnosis not present

## 2023-03-05 MED ORDER — LIDOCAINE-PRILOCAINE 2.5-2.5 % EX CREA: 1.0000 | TOPICAL_CREAM | CUTANEOUS | 2 refills | Status: DC | PRN

## 2023-03-05 NOTE — Progress Notes (Unsigned)
[facility]  Telephone:([phone]) [phone] [phone]) [phone]  ID: Veronica Boyd OB: Oct 25, 1946  MR#: [id number]  AOZ#:[id number]  Patient Care Team: Barbette Reichmann, MD as PCP - General (Internal Medicine) Jeralyn Ruths, MD as Consulting Physician (Oncology) Carmina Miller, MD as Referring Physician (Radiation Oncology) Mauro Kaufmann, NP as Nurse Practitioner (Oncology) Benita Gutter, RN as Oncology Nurse Navigator Salena Saner, MD as Consulting Physician (Pulmonary Disease)  CHIEF COMPLAINT: Stage IV rectal cancer, history of follicular lymphoma.  INTERVAL HISTORY: Patient returns to clinic today for routine evaluation.  Over the past several weeks she has noticed an increase in pelvic pain.  Her shortness of breath and cough have significantly improved.  She does not complain of weakness or fatigue today.  She continues to have a chronic peripheral neuropathy.  She has no other neurologic complaints.  She denies any recent fevers or illnesses.  She denies any chest pain or hemoptysis.  She has a chronic cough.  She denies any nausea, vomiting, constipation or diarrhea.  She has no urinary complaints.  Patient offers no further specific complaints today.  REVIEW OF SYSTEMS:   Review of Systems  Constitutional: Negative.  Negative for fever, malaise/fatigue and weight loss.  Respiratory: Negative.  Negative for cough, hemoptysis and shortness of breath.   Cardiovascular: Negative.  Negative for chest pain and leg swelling.  Gastrointestinal:  Negative for abdominal pain, blood in stool, diarrhea, melena, nausea and vomiting.  Genitourinary: Negative.  Negative for dysuria.  Musculoskeletal:  Negative for back pain, joint pain and neck pain.       Pelvic pain.  Skin: Negative.  Negative for rash.  Neurological:  Positive for tingling and sensory change. Negative for focal weakness, weakness and headaches.  Psychiatric/Behavioral: Negative.  The  patient is not nervous/anxious and does not have insomnia.     As per HPI. Otherwise, a complete review of systems is negative.   PAST MEDICAL HISTORY: Past Medical History:  Diagnosis Date   Anemia due to antineoplastic chemotherapy    Arthritis    Bronchial obstruction    Cataract    bilateral   Chemotherapy-induced neuropathy (HCC)    Chicken pox    Colon polyp    Dyspnea    Follicular lymphoma (HCC) 08/2016   lymph nodes    GERD (gastroesophageal reflux disease)    Hyperlipidemia    Lung nodule    Lung nodules 09/2022   right upper lobe   Mass of right chest wall    Osteoporosis    Protein-calorie malnutrition, severe (HCC)    Rectal carcinoma (HCC)    Thrombocytopenia (HCC)     PAST SURGICAL HISTORY: Past Surgical History:  Procedure Laterality Date   AXILLARY LYMPH NODE DISSECTION Right 08/21/2016   Procedure: AXILLARY LYMPH NODE excision;  Surgeon: Nadeen Landau, MD;  Location: [facility] ORS;  Service: General;  Laterality: Right;   CATARACT EXTRACTION, BILATERAL Bilateral    COLONOSCOPY N/A 09/19/2020   Procedure: COLONOSCOPY;  Surgeon: Regis Bill, MD;  Location: [facility] ENDOSCOPY;  Service: Endoscopy;  Laterality: N/A;   PORTA CATH INSERTION N/A 09/16/2017   Procedure: PORTA CATH INSERTION;  Surgeon: Annice Needy, MD;  Location: [facility] INVASIVE CV LAB;  Service: Cardiovascular;  Laterality: N/A;   TONSILLECTOMY     TOTAL HIP ARTHROPLASTY Left 1992    FAMILY HISTORY: Family History  Problem Relation Age of Onset   Diabetes Sister    Lung cancer Brother    Diabetes Brother  Basal cell carcinoma Daughter    Breast cancer Paternal Aunt     ADVANCED DIRECTIVES (Y/N):  N  HEALTH MAINTENANCE: Social History   Tobacco Use   Smoking status: Never   Smokeless tobacco: Never  Vaping Use   Vaping Use: Never used  Substance Use Topics   Alcohol use: No   Drug use: No     Colonoscopy:  PAP:  Bone density:  Lipid panel:  No Known  Allergies  Current Outpatient Medications  Medication Sig Dispense Refill   diclofenac sodium (VOLTAREN) 1 % GEL Apply 2 g topically 2 (two) times daily as needed.     diphenoxylate-atropine (LOMOTIL) 2.5-0.025 MG tablet TAKE 2 TABLETS BY MOUTH 4 (FOUR) TIMES DAILY AS NEEDED FOR DIARRHEA OR LOOSE STOOLS. 60 tablet 1   gabapentin (NEURONTIN) 300 MG capsule TAKE 1 CAPSULE BY MOUTH TWICE A DAY 60 capsule 2   Multiple Vitamin (MULTIVITAMIN) capsule Take 1 capsule by mouth daily.     pantoprazole (PROTONIX) 20 MG tablet Take 1 tablet by mouth daily as needed for heartburn.     potassium chloride (KLOR-CON M) 10 MEQ tablet Take 1 tablet (10 mEq total) by mouth daily. 14 tablet 0   simvastatin (ZOCOR) 20 MG tablet Take 20 mg by mouth at bedtime.     tiZANidine (ZANAFLEX) 2 MG tablet TAKE 1 TABLET BY MOUTH NIGHTLY AS NEEDED FOR MUSCLE SPASMS. 90 tablet 1   traMADol-acetaminophen (ULTRACET) 37.5-325 MG tablet Take 1 tablet by mouth every 8 (eight) hours as needed.     amoxicillin-clavulanate (AUGMENTIN) 500-125 MG tablet Take 1 tablet by mouth in the morning and at bedtime. (Patient not taking: Reported on 01/03/2023) 20 tablet 0   methylPREDNISolone (MEDROL DOSEPAK) 4 MG TBPK tablet Use as directed. (Patient not taking: Reported on 01/03/2023) 21 each 0   Potassium 99 MG TABS Take 1 tablet by mouth as needed. (Patient not taking: Reported on 01/03/2023)     No current facility-administered medications for this visit.   Facility-Administered Medications Ordered in Other Visits  Medication Dose Route Frequency Provider Last Rate Last Admin   heparin lock flush 100 unit/mL  500 Units Intravenous Once Orlie Dakin, Tollie Pizza, MD       heparin lock flush 100 unit/mL  500 Units Intracatheter PRN Orlie Dakin, Tollie Pizza, MD       heparin lock flush 100 unit/mL  500 Units Intravenous Once Jeralyn Ruths, MD       heparin lock flush 100 unit/mL  500 Units Intravenous Once Jeralyn Ruths, MD       sodium  chloride flush (NS) 0.9 % injection 10 mL  10 mL Intravenous PRN Jeralyn Ruths, MD   10 mL at 12/04/17 0901   sodium chloride flush (NS) 0.9 % injection 10 mL  10 mL Intracatheter PRN Jeralyn Ruths, MD       sodium chloride flush (NS) 0.9 % injection 10 mL  10 mL Intravenous PRN Jeralyn Ruths, MD   10 mL at 04/24/21 1610   yttrium-90 injection 22.3 millicurie  22.3 millicurie Intravenous Once Carmina Miller, MD        OBJECTIVE: There were no vitals filed for this visit.     There is no height or weight on file to calculate BMI.    ECOG FS:0 - Asymptomatic  General: Well-developed, well-nourished, no acute distress. Eyes: Pink conjunctiva, anicteric sclera. HEENT: Normocephalic, moist mucous membranes. Lungs: No audible wheezing or coughing. Heart: Regular rate and rhythm. Abdomen:  Soft, nontender, no obvious distention. Musculoskeletal: No edema, cyanosis, or clubbing. Neuro: Alert, answering all questions appropriately. Cranial nerves grossly intact. Skin: No rashes or petechiae noted. Psych: Normal affect.  LAB RESULTS:  Lab Results  Component Value Date   NA 136 12/05/2022   K 3.8 12/05/2022   CL 100 12/05/2022   CO2 26 12/05/2022   GLUCOSE 86 12/05/2022   BUN 16 12/05/2022   CREATININE 0.71 12/05/2022   CALCIUM 9.1 12/05/2022   PROT 7.3 12/05/2022   ALBUMIN 3.9 12/05/2022   AST 20 12/05/2022   ALT 13 12/05/2022   ALKPHOS 60 12/05/2022   BILITOT 0.6 12/05/2022   GFRNONAA >60 12/05/2022   GFRAA >60 06/28/2020    Lab Results  Component Value Date   WBC 7.9 12/05/2022   NEUTROABS 4.5 12/05/2022   HGB 13.8 12/05/2022   HCT 42.4 12/05/2022   MCV 95.3 12/05/2022   PLT 269 12/05/2022     STUDIES: No results found.  ONCOLOGY HISTORY:  Biopsy confirmed rectal cancer.  MRI completed at [facility] on October 11, 2020 revealed extension of the tumor beyond the wall into the rectovaginal recess with suspected invasion of the vaginal wall  posteriorly.  Tumor also appears to involve the internal anal sphincter.  There are also numerous prominent presacral and mesorectal lymph nodes highly suspicious for malignancy.  PET scan results from November 17, 2020 reviewed independently with 3 hypermetabolic right lower lobe lung nodules consistent with pulmonary metastasis.  Patient completed cycle 8 of FOLFOX on Mar 29, 2021.  She then completed cycle 5 of her 5-FU pump on May 26, 2021 and XRT on June 01, 2021.  PET scan results from July 17, 2021 reviewed independently with no evidence of disease other than enlarging right lower lobe lung lesion.  Patient completed SBRT to the right lung lesion on August 22, 2021.  Repeat PET scan on July 25, 2021 revealed minimal residual disease in the rectum and likely resolution of lesions in the lung.    ASSESSMENT: Stage IV rectal cancer, history of follicular lymphoma.  PLAN:    Stage IV rectal cancer: See oncology history as above.  Patient's CEA has increased to 10.9 and she is having symptoms of pelvic pain.  Will get MRI of pelvis to further evaluate.  Previously, bronchoscopy and biopsy only revealed inflammatory tissue with collapsed lung which could possibly give a false positive on PET scan.  No malignancy was identified.  Repeat bronchoscopy and biopsy continue to show no evidence of disease.  Pulmonary nodule: Imaging and biopsy results as above.  No malignancy identified.  Recurrent follicular lymphoma: CT scan results from December 23, 2019 reviewed independently with no obvious evidence of recurrent or progressive disease.  PET scan results as above consistent with rectal cancer metastasis and no evidence of lymphoma.   Patient received Zevalin on July 30, 2017 with not much therapeutic effect.  She subsequently underwent cycles 5 of R-CHOP chemotherapy with Neulasta support completing on December 12, 2017.  Patient continues to be in complete remission.  Neuropathy: Chronic and  unchanged. Pelvic pain: MRI as above.  Patient was also given a prescription for hydrocodone.  I spent a total of 30 minutes reviewing chart data, face-to-face evaluation with the patient, counseling and coordination of care as detailed above.    Patient expressed understanding and was in agreement with this plan. She also understands that She can call clinic at any time with any questions, concerns, or complaints.    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

## 2023-03-05 NOTE — Progress Notes (Unsigned)
Patient is having pain in the rectal region, and she rates her pain at a 8, she is unable sit down.

## 2023-03-06 ENCOUNTER — Encounter: Payer: Self-pay | Admitting: Oncology

## 2023-03-12 ENCOUNTER — Inpatient Hospital Stay: Payer: 59 | Attending: Oncology

## 2023-03-12 ENCOUNTER — Ambulatory Visit
Admission: RE | Admit: 2023-03-12 | Discharge: 2023-03-12 | Disposition: A | Discharge status: Home / Self Care | Payer: 59 | Source: Ambulatory Visit | Attending: Oncology | Admitting: Oncology

## 2023-03-12 DIAGNOSIS — C20 Malignant neoplasm of rectum: Secondary | ICD-10-CM

## 2023-03-12 DIAGNOSIS — G629 Polyneuropathy, unspecified: Secondary | ICD-10-CM | POA: Insufficient documentation

## 2023-03-12 DIAGNOSIS — C8208 Follicular lymphoma grade I, lymph nodes of multiple sites: Secondary | ICD-10-CM | POA: Diagnosis present

## 2023-03-12 DIAGNOSIS — Z79899 Other long term (current) drug therapy: Secondary | ICD-10-CM | POA: Insufficient documentation

## 2023-03-12 DIAGNOSIS — R630 Anorexia: Secondary | ICD-10-CM | POA: Insufficient documentation

## 2023-03-12 DIAGNOSIS — R102 Pelvic and perineal pain: Secondary | ICD-10-CM | POA: Insufficient documentation

## 2023-03-12 LAB — COMPREHENSIVE METABOLIC PANEL
ALT: 11 U/L (ref 0–44)
AST: 20 U/L (ref 15–41)
Albumin: 3.5 g/dL (ref 3.5–5.0)
Alkaline Phosphatase: 61 U/L (ref 38–126)
Anion gap: 10 (ref 5–15)
BUN: 12 mg/dL (ref 8–23)
CO2: 27 mmol/L (ref 22–32)
Calcium: 9 mg/dL (ref 8.9–10.3)
Chloride: 99 mmol/L (ref 98–111)
Creatinine, Ser: 0.69 mg/dL (ref 0.44–1.00)
GFR, Estimated: >60 mL/min (ref >60)
Glucose, Bld: 102 mg/dL — ABNORMAL HIGH (ref 70–99)
Potassium: 3.4 mmol/L — ABNORMAL LOW (ref 3.5–5.1)
Sodium: 136 mmol/L (ref 135–145)
Total Bilirubin: 0.4 mg/dL (ref 0.3–1.2)
Total Protein: 6.7 g/dL (ref 6.5–8.1)

## 2023-03-12 LAB — CBC WITH DIFFERENTIAL/PLATELET
Abs Immature Granulocytes: 0.03 10*3/uL (ref 0.00–0.07)
Basophils Absolute: 0 10*3/uL (ref 0.0–0.1)
Basophils Relative: 0 %
Eosinophils Absolute: 0.1 10*3/uL (ref 0.0–0.5)
Eosinophils Relative: 1 %
HCT: 40.2 % (ref 36.0–46.0)
Hemoglobin: 12.9 g/dL (ref 12.0–15.0)
Immature Granulocytes: 0 %
Lymphocytes Relative: 22 %
Lymphs Abs: 1.7 10*3/uL (ref 0.7–4.0)
MCH: 29.8 pg (ref 26.0–34.0)
MCHC: 32.1 g/dL (ref 30.0–36.0)
MCV: 92.8 fL (ref 80.0–100.0)
Monocytes Absolute: 0.9 10*3/uL (ref 0.1–1.0)
Monocytes Relative: 11 %
Neutro Abs: 4.8 10*3/uL (ref 1.7–7.7)
Neutrophils Relative %: 66 %
Platelets: 275 10*3/uL (ref 150–400)
RBC: 4.33 MIL/uL (ref 3.87–5.11)
RDW: 14.6 % (ref 11.5–15.5)
WBC: 7.4 10*3/uL (ref 4.0–10.5)
nRBC: 0 % (ref 0.0–0.2)

## 2023-03-12 MED ORDER — SODIUM CHLORIDE 0.9% FLUSH
10.0000 mL | INTRAVENOUS | Status: DC | PRN
  Administered 2023-03-12: 10 mL via INTRAVENOUS
  Filled 2023-03-12: qty 10

## 2023-03-12 MED ORDER — HEPARIN SOD (PORK) LOCK FLUSH 100 UNIT/ML IV SOLN
500.0000 [IU] | Freq: Once | INTRAVENOUS | Status: DC
  Administered 2023-03-12: 500 [IU] via INTRAVENOUS
  Filled 2023-03-12: qty 5

## 2023-03-12 MED ORDER — GADOBUTROL 1 MMOL/ML IV SOLN
4.0000 mL | Freq: Once | INTRAVENOUS | Status: AC | PRN
  Administered 2023-03-12: 4 mL via INTRAVENOUS

## 2023-03-14 ENCOUNTER — Ambulatory Visit (INDEPENDENT_AMBULATORY_CARE_PROVIDER_SITE_OTHER): Payer: 59 | Admitting: Pulmonary Disease

## 2023-03-14 ENCOUNTER — Encounter: Payer: Self-pay | Admitting: Pulmonary Disease

## 2023-03-14 VITALS — BP 90/60 | HR 116 | Temp 97.9°F | Ht <= 58 in | Wt 85.0 lb

## 2023-03-14 DIAGNOSIS — R0602 Shortness of breath: Secondary | ICD-10-CM | POA: Diagnosis not present

## 2023-03-14 DIAGNOSIS — C20 Malignant neoplasm of rectum: Secondary | ICD-10-CM

## 2023-03-14 DIAGNOSIS — J9809 Other diseases of bronchus, not elsewhere classified: Secondary | ICD-10-CM

## 2023-03-14 DIAGNOSIS — R911 Solitary pulmonary nodule: Secondary | ICD-10-CM | POA: Diagnosis not present

## 2023-03-14 DIAGNOSIS — C8208 Follicular lymphoma grade I, lymph nodes of multiple sites: Secondary | ICD-10-CM

## 2023-03-14 LAB — CEA: CEA: 29.8 ng/mL — ABNORMAL HIGH (ref 0.0–4.7)

## 2023-03-14 NOTE — Progress Notes (Signed)
Subjective:    Patient ID: SUTTER DRILLING, female    DOB: [birth year], 77 y.o.   MRN: [id number] Patient Care Team: Barbette Reichmann, MD as PCP - General (Internal Medicine) Jeralyn Ruths, MD as Consulting Physician (Oncology) Carmina Miller, MD as Referring Physician (Radiation Oncology) Mauro Kaufmann, NP as Nurse Practitioner (Oncology) Benita Gutter, RN as Oncology Nurse Navigator Salena Saner, MD as Consulting Physician (Pulmonary Disease)  Chief Complaint  Patient presents with   Follow-up    Nodule. Bronchoscopy 11/28/2022. SOB with exertion. No wheezing. Dry cough.    HPI Veronica Boyd is a 77 year old lifelong never smoker who follows up for pulmonary nodules in the setting of stage IV rectal cancer and a history of follicular lymphoma.  We last saw the patient on 03 January 2023 at that time no interventions were recommended.  Recall that the patient had undergone SBRT x 2 to her right lower lobe and right midlung due to what where apparent metastatic deposits.  She has also previously completed concurrent chemoradiation therapy as well as FOLFOX for locally advanced rectal cancer. The patient underwent bronchoscopy on 01 October 2022 this showed that the patient had total occlusion of the right middle lobe medial and lateral (B4/B5) subsegments with gelatinous inflammatory debris, the formal pathology revealed intense inflammatory change and calcifications.  Of note the patient had had issues with fractured teeth and there is a concern for aspiration of the tooth remnants.  The patient had experienced intractable cough prior to the procedure. Since that bronchoscopy she has had marked improvement on her cough and now this has completely resolved.  She occasionally had difficulty taking a deep breath but this has also improved from prior.  She had a normal swallow eval on 27 December.  I suspect that her aspiration of chipped teeth occurred during sleep and  patient was unaware.  This issue added complexity to her bronchoscopic procedure as it distorted her airway.  A follow-up CT chest on 9 January that showed that 2 of the nodules noted previously had resolved, her right middle lobe collapse also had resolved but the index nodule in the right lower lobe had increased in size to 2.3 cm.  She underwent repeat bronchoscopy on 28 November 2022.  This showed a more patent right lower lobe however the middle lobe subsegment had still some mucous plugging and debris and this was lavaged and suctioned till cleared.  The remainder lower lobe bronchi showed stenotic segments post radiation.  This also affected the segmentation tree for bronchoscopy.  However the lesion in question was reached and sampled and again sampling was consistent with intense inflammatory debris.  After discussion with Dr. Orlie Dakin the patient's oncologist, it was elected to treat the patient with antibiotics and follow-up with CT at a later time.  She had an MRI of the pelvis and on 7 May, results are still pending.  Will have upcoming CT chest abdomen and pelvis ordered by Dr. Orlie Dakin.  Today she presents with some dyspnea which occurred while walking from the parking lot to the clinic.  She also noted some tachypalpitations.  He has had an occasional dry cough.  No wheezing.  No sputum production, no hemoptysis.   She has not had any hemoptysis.  She has not had any fevers, chills or sweats.  No chest pain. No dyspnea.   She appears very frail and debilitated.  Review of Systems A 10 point review of systems was performed and it is as  noted above otherwise negative.  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) 07/24/2016   Mass of right chest wall 06/06/2016   Primary osteoarthritis involving multiple joints 12/14/2015   Transient insomnia 08/27/2015   Mixed hyperlipidemia 03/01/2015   Nocturnal muscle cramps 03/01/2015   Osteoporosis 11/08/2014   Primary osteoarthritis of both knees 07/05/2014   Hyperlipidemia 03/19/2014   Osteoarthritis of knee 03/19/2014   Abnormal mammogram 03/03/2013   Vitamin D deficiency 05/09/2012   Social History   Tobacco Use   Smoking status: Never   Smokeless tobacco: Never  Substance Use Topics   Alcohol use: No   No Known Allergies  Current Meds  Medication Sig   cyanocobalamin (VITAMIN B12) 1000 MCG tablet Take 1,000 mcg by mouth daily.   diclofenac sodium (VOLTAREN) 1 % GEL Apply 2 g topically 2 (two) times daily as needed.   diphenoxylate-atropine (LOMOTIL) 2.5-0.025 MG tablet TAKE 2 TABLETS BY MOUTH 4 (FOUR) TIMES DAILY AS NEEDED FOR DIARRHEA OR LOOSE STOOLS.   gabapentin (NEURONTIN) 300 MG capsule TAKE 1 CAPSULE BY MOUTH TWICE A DAY   ibandronate (BONIVA) 150 MG tablet Take 150 mg by mouth every 30 (thirty) days.   lidocaine-prilocaine (EMLA) cream Apply 1 Application topically as needed. Apply to port 1 hour prior to use as needed   Multiple Vitamin (MULTIVITAMIN) capsule Take 1 capsule by mouth daily.   pantoprazole (PROTONIX) 20 MG tablet Take 1 tablet by mouth daily as needed for heartburn.   potassium chloride (KLOR-CON M) 10 MEQ tablet Take 1 tablet (10 mEq total) by mouth daily.   simvastatin (ZOCOR) 20 MG tablet Take 20 mg by mouth at bedtime.   tiZANidine (ZANAFLEX) 2 MG  tablet TAKE 1 TABLET BY MOUTH NIGHTLY AS NEEDED FOR MUSCLE SPASMS.   traMADol-acetaminophen (ULTRACET) 37.5-325 MG tablet Take 1 tablet by mouth every 8 (eight) hours as needed.   Immunization History  Administered Date(s) Administered   Influenza-Unspecified 09/20/2014   PFIZER(Purple Top)SARS-COV-2 Vaccination 01/07/2020, 02/02/2020   Unspecified SARS-COV-2 Vaccination 02/02/2020   Zoster Recombinat (Shingrix) 11/29/2019, 03/20/2020   Zoster, Live 09/20/2014      Objective:   Physical Exam BP 90/60 (BP Location: Left Arm, Cuff Size: Normal)   Pulse (!) 116   Temp 97.9 F (36.6 C)   Ht 4\' 10"  (1.473 m)   Wt 85 lb (38.6 kg)   SpO2 93%   BMI 17.77 kg/m   SpO2: 93 % O2 Device: None (Room air)  GENERAL: Thin, elderly female, pale, fully ambulatory.  No conversational dyspnea. HEAD: Normocephalic, atraumatic.  EYES: Pupils equal, round, reactive to light.  No scleral icterus.  MOUTH: Poor dentition.  She has had 12 teeth extracted which were previously noted to be chipped and in poor repair. NECK: Supple. No thyromegaly. Trachea midline. No JVD.  No adenopathy. PULMONARY: Good air entry bilaterally.  No adventitious sounds. CARDIOVASCULAR: S1 and S2.  Tachycardic with  regular rhythm.  No rubs, murmurs or gallops heard. ABDOMEN: Scaphoid otherwise benign. MUSCULOSKELETAL: No joint deformity, no clubbing, no edema.  NEUROLOGIC: Grossly nonfocal.  Fully ambulatory.  Speech is fluent. SKIN: Intact,warm,dry. PSYCH: Mood and behavior normal.   Ambulatory oximetry was performed today: Patient maintain oxygen saturations throughout the test, at rest on room air oxygen saturation was 96%, O2 nadir was 94% midway through the study and then increased to 95% and remained stable after that.  Baseline heart rate was 117 bpm, after minimal exertion patient's heart rate increased to 139 bpm and at peak exercise it was 141 bpm.  Patient ambulated at a slow pace and complained of moderate  shortness of breath.  EKG was performed: This shows a sinus tachycardia, right atrial enlargement, no other rhythm abnormality noted.    Assessment & Plan:     ICD-10-CM   1. SOB (shortness of breath)  R06.02 EKG 12-Lead    ECHOCARDIOGRAM COMPLETE   Multifactorial Tachycardia/debility/deconditioning No evidence of pulmonary compromise Echocardiogram    2. Lung nodule - RLL 2.3 cm  R91.1    Still concerning for metastatic disease Has surveillance imaging ordered    3. Bronchial obstruction - RESOLVED  J98.09    As of last bronchoscopy this issue resolved    4. Rectal cancer (HCC)  C20    Issue adds complexity to her management Follows with oncology Recent pelvic MRI review pending    5. 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 [facility])  C82.08    This issue adds complexity to her management Follows with oncology     Orders Placed This Encounter  Procedures   EKG 12-Lead   ECHOCARDIOGRAM COMPLETE    Standing Status:   Future    Standing Expiration Date:   03/13/2024    Order Specific Question:   Where should this test be performed    Answer:   [facility]    Order Specific Question:   Perflutren DEFINITY (image enhancing agent) should be administered unless hypersensitivity or allergy exist    Answer:   Administer Perflutren    Order Specific Question:   Reason for exam-Echo    Answer:   Dyspnea  R06.00   Will see the patient in follow-up in 4 to 6 weeks time she is to contact us prior to that time should any new difficulties arise.  Gailen Shelter, MD Advanced Bronchoscopy PCCM [facility]    *This note was dictated using voice recognition software/Dragon.  Despite best efforts to proofread, errors can occur which can change the meaning. Any transcriptional errors that result from this process are unintentional and may not be fully corrected at the time of dictation.

## 2023-03-14 NOTE — Patient Instructions (Signed)
Your oxygen level today was good doing your walk.  We are going to check an echocardiogram which is a heart test to evaluate for potential heart issues.  We will see on follow-up in 4 to 6 weeks time call sooner should any new problems arise.

## 2023-03-18 ENCOUNTER — Telehealth: Payer: Self-pay

## 2023-03-18 ENCOUNTER — Other Ambulatory Visit: Payer: Self-pay | Admitting: Oncology

## 2023-03-18 DIAGNOSIS — C20 Malignant neoplasm of rectum: Secondary | ICD-10-CM

## 2023-03-18 NOTE — Telephone Encounter (Signed)
-----   Message from Jeralyn Ruths, MD sent at 03/18/2023 12:14 PM EDT ----- MRI suggestive of local recurrence of disease, but I have ordered a stat PET to confirm.  I can see patient this week if she wants or we can wait 2-3 days after PET.  Thanks!

## 2023-03-18 NOTE — Telephone Encounter (Signed)
LVM with patient to try to inform her of response below. Will call back shortly. PET scan has already been scheduled on 5/16 @ 12:30 (pending auth).

## 2023-03-19 NOTE — Telephone Encounter (Signed)
Patients daughter Zella Ball returned my call. Aware of PET scan on 5/16 and follow up appointment with Dr. Orlie Dakin on 5/20.

## 2023-03-20 ENCOUNTER — Encounter: Payer: Self-pay | Admitting: Pulmonary Disease

## 2023-03-21 ENCOUNTER — Ambulatory Visit
Admission: RE | Admit: 2023-03-21 | Discharge: 2023-03-21 | Disposition: A | Discharge status: Home / Self Care | Payer: 59 | Source: Ambulatory Visit | Attending: Oncology | Admitting: Oncology

## 2023-03-21 DIAGNOSIS — J439 Emphysema, unspecified: Secondary | ICD-10-CM | POA: Insufficient documentation

## 2023-03-21 DIAGNOSIS — C20 Malignant neoplasm of rectum: Secondary | ICD-10-CM | POA: Diagnosis present

## 2023-03-21 DIAGNOSIS — I700 Atherosclerosis of aorta: Secondary | ICD-10-CM | POA: Diagnosis not present

## 2023-03-21 DIAGNOSIS — Z8572 Personal history of non-Hodgkin lymphomas: Secondary | ICD-10-CM | POA: Insufficient documentation

## 2023-03-21 LAB — GLUCOSE, CAPILLARY: Glucose-Capillary: 82 mg/dL (ref 70–99)

## 2023-03-21 MED ORDER — FLUDEOXYGLUCOSE F - 18 (FDG) INJECTION
5.7300 | Freq: Once | INTRAVENOUS | Status: AC | PRN
  Administered 2023-03-21: 5.73 via INTRAVENOUS

## 2023-03-25 ENCOUNTER — Encounter: Payer: Self-pay | Admitting: Oncology

## 2023-03-25 ENCOUNTER — Telehealth: Payer: Self-pay | Admitting: *Deleted

## 2023-03-25 ENCOUNTER — Inpatient Hospital Stay (HOSPITAL_BASED_OUTPATIENT_CLINIC_OR_DEPARTMENT_OTHER): Payer: 59 | Admitting: Oncology

## 2023-03-25 VITALS — BP 107/53 | HR 67 | Temp 97.9°F | Resp 16 | Ht <= 58 in | Wt 83.0 lb

## 2023-03-25 DIAGNOSIS — C20 Malignant neoplasm of rectum: Secondary | ICD-10-CM | POA: Diagnosis not present

## 2023-03-25 MED ORDER — ONDANSETRON HCL 8 MG PO TABS
8.0000 mg | ORAL_TABLET | Freq: Three times a day (TID) | ORAL | 2 refills | Status: DC | PRN
Start: 2023-03-25 — End: 2023-11-19

## 2023-03-25 MED ORDER — MEGESTROL ACETATE 40 MG PO TABS: 40.0000 mg | ORAL_TABLET | Freq: Every day | ORAL | 1 refills | Status: DC

## 2023-03-25 NOTE — Progress Notes (Signed)
DISCONTINUE OFF PATHWAY REGIMEN - Colorectal   [id number]:Fluorouracil 225 mg/m2/day CIV D1-5 q7 Days + RT:   A cycle is every 7 days, concurrent with RT:     Fluorouracil   **Always confirm dose/schedule in your pharmacy ordering system**  REASON: Disease Progression PRIOR TREATMENT: Off Pathway: Fluorouracil 225 mg/m2/day CIV D1-5 q7 Days + RT TREATMENT RESPONSE: Progressive Disease (PD)  START ON PATHWAY REGIMEN - Colorectal     A cycle is every 14 days:     Bevacizumab-xxxx      Oxaliplatin      Leucovorin      Fluorouracil      Fluorouracil   **Always confirm dose/schedule in your pharmacy ordering system**  Patient Characteristics: Distant Metastases, Nonsurgical Candidate, Non-KRAS G12C, RAS Mutation Positive/Unknown (BRAF V600 Wild-Type/Unknown), Standard Cytotoxic Therapy, First Line Standard Cytotoxic Therapy, Bevacizumab Eligible, PS = 0,1 Tumor Location: Rectal Therapeutic Status: Distant Metastases Microsatellite/Mismatch Repair Status: Unknown BRAF Mutation Status: Did Not Order Test KRAS/NRAS Mutation Status: Did Not Order Test Preferred Therapy Approach: Standard Cytotoxic Therapy Standard Cytotoxic Line of Therapy: First Line Standard Cytotoxic Therapy ECOG Performance Status: 1 Bevacizumab Eligibility: Eligible Intent of Therapy: Non-Curative / Palliative Intent, Discussed with Patient

## 2023-03-25 NOTE — Telephone Encounter (Signed)
Received FMLA form for Veronica Boyd. Form completed and sent for physician signature

## 2023-03-25 NOTE — Progress Notes (Signed)
[name]  [phone] [phone]  ID: Veronica Boyd OB: Oct 14, 1946  MR#: [id number]  ION#:[id number]  Patient Care Team: Barbette Reichmann, MD as PCP - General (Internal Medicine) Jeralyn Ruths, MD as Consulting Physician (Oncology) Carmina Miller, MD as Referring Physician (Radiation Oncology) Mauro Kaufmann, NP as Nurse Practitioner (Oncology) Benita Gutter, RN as Oncology Nurse Navigator Salena Saner, MD as Consulting Physician (Pulmonary Disease)  CHIEF COMPLAINT: Recurrent stage IV rectal cancer, history of follicular lymphoma.  INTERVAL HISTORY: Patient returns to clinic today for further evaluation, discussion of her PET scan results, and treatment planning.  Her pain is better controlled since reinitiating tramadol.  She continues to have chronic weakness and fatigue.  She has a poor appetite.  She has a chronic peripheral neuropathy, but no other neurologic complaints.  She denies any recent fevers or illnesses.  She denies any chest pain or hemoptysis.  She has a chronic cough.  She denies any nausea, vomiting, constipation or diarrhea.  She has no urinary complaints.  Patient offers no further specific complaints today.  REVIEW OF SYSTEMS:   Review of Systems  Constitutional:  Positive for malaise/fatigue. Negative for fever and weight loss.  Respiratory: Negative.  Negative for cough, hemoptysis and shortness of breath.   Cardiovascular: Negative.  Negative for chest pain and leg swelling.  Gastrointestinal:  Negative for abdominal pain, blood in stool, diarrhea, melena, nausea and vomiting.  Genitourinary: Negative.  Negative for dysuria.  Musculoskeletal: Negative.  Negative for back pain, joint pain and neck pain.  Skin: Negative.  Negative for rash.  Neurological:  Positive for tingling, sensory change and weakness. Negative for focal weakness and headaches.  Psychiatric/Behavioral: Negative.  The patient is  not nervous/anxious and does not have insomnia.     As per HPI. Otherwise, a complete review of systems is negative.   PAST MEDICAL HISTORY: Past Medical History:  Diagnosis Date   Anemia due to antineoplastic chemotherapy    Arthritis    Bronchial obstruction    Cataract    bilateral   Chemotherapy-induced neuropathy (HCC)    Chicken pox    Colon polyp    Dyspnea    Follicular lymphoma (HCC) 08/2016   lymph nodes    GERD (gastroesophageal reflux disease)    Hyperlipidemia    Lung nodule    Lung nodules 09/2022   right upper lobe   Mass of right chest wall    Osteoporosis    Protein-calorie malnutrition, severe (HCC)    Rectal carcinoma (HCC)    Thrombocytopenia (HCC)     PAST SURGICAL HISTORY: Past Surgical History:  Procedure Laterality Date   AXILLARY LYMPH NODE DISSECTION Right 08/21/2016   Procedure: AXILLARY LYMPH NODE excision;  Surgeon: Nadeen Landau, MD;  Location: [facility] ORS;  Service: General;  Laterality: Right;   CATARACT EXTRACTION, BILATERAL Bilateral    COLONOSCOPY N/A 09/19/2020   Procedure: COLONOSCOPY;  Surgeon: Regis Bill, MD;  Location: [facility] ENDOSCOPY;  Service: Endoscopy;  Laterality: N/A;   PORTA CATH INSERTION N/A 09/16/2017   Procedure: PORTA CATH INSERTION;  Surgeon: Annice Needy, MD;  Location: [facility] INVASIVE CV LAB;  Service: Cardiovascular;  Laterality: N/A;   TONSILLECTOMY     TOTAL HIP ARTHROPLASTY Left 1992    FAMILY HISTORY: Family History  Problem Relation Age of Onset   Diabetes Sister    Lung cancer Brother    Diabetes Brother    Basal cell carcinoma Daughter  Breast cancer Paternal Aunt     ADVANCED DIRECTIVES (Y/N):  N  HEALTH MAINTENANCE: Social History   Tobacco Use   Smoking status: Never   Smokeless tobacco: Never  Vaping Use   Vaping Use: Never used  Substance Use Topics   Alcohol use: No   Drug use: No     Colonoscopy:  PAP:  Bone density:  Lipid panel:  No Known Allergies  Current  Outpatient Medications  Medication Sig Dispense Refill   cyanocobalamin (VITAMIN B12) 1000 MCG tablet Take 1,000 mcg by mouth daily.     diclofenac sodium (VOLTAREN) 1 % GEL Apply 2 g topically 2 (two) times daily as needed.     diphenoxylate-atropine (LOMOTIL) 2.5-0.025 MG tablet TAKE 2 TABLETS BY MOUTH 4 (FOUR) TIMES DAILY AS NEEDED FOR DIARRHEA OR LOOSE STOOLS. 60 tablet 1   gabapentin (NEURONTIN) 300 MG capsule TAKE 1 CAPSULE BY MOUTH TWICE A DAY 60 capsule 2   ibandronate (BONIVA) 150 MG tablet Take 150 mg by mouth every 30 (thirty) days.     lidocaine-prilocaine (EMLA) cream Apply 1 Application topically as needed. Apply to port 1 hour prior to use as needed 30 g 2   Multiple Vitamin (MULTIVITAMIN) capsule Take 1 capsule by mouth daily.     pantoprazole (PROTONIX) 20 MG tablet Take 1 tablet by mouth daily as needed for heartburn.     potassium chloride (KLOR-CON M) 10 MEQ tablet Take 1 tablet (10 mEq total) by mouth daily. 14 tablet 0   simvastatin (ZOCOR) 20 MG tablet Take 20 mg by mouth at bedtime.     tiZANidine (ZANAFLEX) 2 MG tablet TAKE 1 TABLET BY MOUTH NIGHTLY AS NEEDED FOR MUSCLE SPASMS. 90 tablet 1   traMADol-acetaminophen (ULTRACET) 37.5-325 MG tablet Take 1 tablet by mouth every 8 (eight) hours as needed.     amoxicillin-clavulanate (AUGMENTIN) 500-125 MG tablet Take 1 tablet by mouth in the morning and at bedtime. (Patient not taking: Reported on 01/03/2023) 20 tablet 0   methylPREDNISolone (MEDROL DOSEPAK) 4 MG TBPK tablet Use as directed. (Patient not taking: Reported on 01/03/2023) 21 each 0   Potassium 99 MG TABS Take 1 tablet by mouth as needed. (Patient not taking: Reported on 01/03/2023)     No current facility-administered medications for this visit.   Facility-Administered Medications Ordered in Other Visits  Medication Dose Route Frequency Provider Last Rate Last Admin   heparin lock flush 100 unit/mL  500 Units Intravenous Once Orlie Dakin, Tollie Pizza, MD       heparin  lock flush 100 unit/mL  500 Units Intracatheter PRN Orlie Dakin, Tollie Pizza, MD       heparin lock flush 100 unit/mL  500 Units Intravenous Once Jeralyn Ruths, MD       heparin lock flush 100 unit/mL  500 Units Intravenous Once Jeralyn Ruths, MD       sodium chloride flush (NS) 0.9 % injection 10 mL  10 mL Intravenous PRN Jeralyn Ruths, MD   10 mL at 12/04/17 0901   sodium chloride flush (NS) 0.9 % injection 10 mL  10 mL Intracatheter PRN Jeralyn Ruths, MD       sodium chloride flush (NS) 0.9 % injection 10 mL  10 mL Intravenous PRN Jeralyn Ruths, MD   10 mL at 04/24/21 1610   yttrium-90 injection 22.3 millicurie  22.3 millicurie Intravenous Once Carmina Miller, MD        OBJECTIVE: Vitals:   03/25/23 1021  BP: Marland Kitchen)  107/53  Pulse: 67  Resp: 16  Temp: 97.9 F (36.6 C)  SpO2: 100%       Body mass index is 17.35 kg/m.    ECOG FS:0 - Asymptomatic  General: Thin, no acute distress. Eyes: Pink conjunctiva, anicteric sclera. HEENT: Normocephalic, moist mucous membranes. Lungs: No audible wheezing or coughing. Heart: Regular rate and rhythm. Abdomen: Soft, nontender, no obvious distention. Musculoskeletal: No edema, cyanosis, or clubbing. Neuro: Alert, answering all questions appropriately. Cranial nerves grossly intact. Skin: No rashes or petechiae noted. Psych: Normal affect.   LAB RESULTS:  Lab Results  Component Value Date   NA 136 03/12/2023   K 3.4 (L) 03/12/2023   CL 99 03/12/2023   CO2 27 03/12/2023   GLUCOSE 102 (H) 03/12/2023   BUN 12 03/12/2023   CREATININE 0.69 03/12/2023   CALCIUM 9.0 03/12/2023   PROT 6.7 03/12/2023   ALBUMIN 3.5 03/12/2023   AST 20 03/12/2023   ALT 11 03/12/2023   ALKPHOS 61 03/12/2023   BILITOT 0.4 03/12/2023   GFRNONAA >60 03/12/2023   GFRAA >60 06/28/2020    Lab Results  Component Value Date   WBC 7.4 03/12/2023   NEUTROABS 4.8 03/12/2023   HGB 12.9 03/12/2023   HCT 40.2 03/12/2023   MCV 92.8  03/12/2023   PLT 275 03/12/2023     STUDIES: NM PET Image Restag (PS) Skull Base To Thigh  Result Date: 03/21/2023 CLINICAL DATA:  Subsequent treatment strategy for rectal cancer. Recurrence on recent pelvic MRI. History of lymphoma. EXAM: NUCLEAR MEDICINE PET SKULL BASE TO THIGH TECHNIQUE: 5.73 mCi F-18 FDG was injected intravenously. Full-ring PET imaging was performed from the skull base to thigh after the radiotracer. CT data was obtained and used for attenuation correction and anatomic localization. Fasting blood glucose: 82 mg/dl COMPARISON:  MR pelvis 03/12/2023, chest CT 11/27/2022 and PET-CT 04/04/2022. FINDINGS: Mediastinal blood pool activity: SUV max 1.6 NECK: No hypermetabolic cervical lymph nodes are identified. No suspicious activity identified within the pharyngeal mucosal space. Incidental CT findings: none CHEST: There are no hypermetabolic mediastinal, hilar or axillary lymph nodes. There has been substantial interval enlargement of a previously demonstrated right lower lobe infrahilar mass, now measuring approximately 6.0 x 4.6 cm on image 60/4. This has significantly enlarged compared with the most recent chest CT of 4 months ago. This mass demonstrates heterogeneous hypermetabolic activity with an SUV max of 15.1. In addition, there are several other enlarging pulmonary nodules bilaterally with hypermetabolic activity. For example, there is 1.1 cm nodule in the right lower lobe on image 63/4. In the left lower lobe, there is a 1.0 cm nodule on image 59/4 (SUV max 2.3). Incidental CT findings: Underlying centrilobular and paraseptal emphysema with chronic central airway thickening. Right IJ Port-A-Cath extends to the superior cavoatrial junction. There is atherosclerosis of the aorta, great vessels and coronary arteries. ABDOMEN/PELVIS: There is no hypermetabolic activity within the liver, adrenal glands, spleen or pancreas. The recurrent rectal mass seen on recent pelvic MRI is  intensely hypermetabolic (SUV max 17.3). This has enlarged and demonstrates increased metabolic activity compared with the prior PET-CT. As suggested on MRI, this may invade the posterior wall of the vagina. No hypermetabolic lymph nodes are identified in the abdomen or pelvis. There is a small right inguinal lymph node with low level metabolic activity, nonspecific. Incidental CT findings: Aortic and branch vessel atherosclerosis. No ascites or peritoneal nodularity. SKELETON: There is no hypermetabolic activity to suggest osseous metastatic disease. Incidental CT findings: Previous left total  hip arthroplasty. IMPRESSION: 1. Enlarging and hypermetabolic rectal mass consistent with local recurrence of rectal cancer. This may invade the posterior wall of the vagina. 2. Significant enlargement of previously demonstrated right lower lobe lung mass which is hypermetabolic, consistent with malignancy. There are several other enlarging pulmonary nodules bilaterally with hypermetabolic activity, also consistent with metastatic disease. Consider tissue sampling if not previously performed given the patient's history of lymphoma and the rapid progression over the last 4 months. Primary bronchogenic carcinoma would be a consideration as well. 3. No evidence of metastatic disease in the neck, abdomen or pelvis. 4. Aortic Atherosclerosis (ICD10-I70.0) and Emphysema (ICD10-J43.9). Electronically Signed   By: Carey Bullocks M.D.   On: 03/21/2023 15:09   MR PELVIS W WO CONTRAST  Result Date: 03/15/2023 CLINICAL DATA:  Rectal cancer, history of lymphoma EXAM: MRI PELVIS WITHOUT AND WITH CONTRAST TECHNIQUE: Multiplanar multisequence MR imaging of the pelvis was performed both before and after administration of intravenous contrast. CONTRAST:  4mL GADAVIST GADOBUTROL 1 MMOL/ML IV SOLN COMPARISON:  CT abdomen/pelvis dated 06/01/2022 FINDINGS: Dedicated rectal cancer protocol not performed. This limits evaluation. Urinary Tract:   Bladder is underdistended. Bowel: Long segment concentric wall thickening/mass involving the lower rectum, measuring at least 5.4 cm in length (series 7/image 16). Tumor extends to the internal anal sphincter. Direct perirectal extension anteriorly through the muscularis propria at least 2.0 cm (series 4/image 24), with suspected invasion into the posterior aspect of the vagina. 5 mm distance between the tumor in the anterior mesorectal fascia (series 4/image 21). Vascular/Lymphatic: No evidence of aneurysm. A single left anterior perirectal node measuring 4 mm short axis is present (series 4/image 17). No convincing lymphadenopathy greater than 5 mm. Reproductive: Uterus is poorly evaluated but grossly unremarkable. No adnexal masses. Other:  No pelvic ascites. Musculoskeletal: Mild degenerative changes of the lower lumbar spine. Left hip arthroplasty with susceptibility artifact. 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 Suspected tumor invasion into the posterior aspect of the vagina. Direct visualization suggested for confirmation. Although dedicated rectal protocol not performed, the appearance favors T4 N0 disease on the current study. Electronically Signed   By: Charline Bills M.D.   On: 03/15/2023 01:20    ONCOLOGY HISTORY:  Biopsy confirmed rectal cancer.  MRI completed at [facility] on October 11, 2020 revealed extension of the tumor beyond the wall into the rectovaginal recess with suspected invasion of the vaginal wall posteriorly.  Tumor also appears to involve the internal anal sphincter.  There are also numerous prominent presacral and mesorectal lymph nodes highly suspicious for malignancy.  PET scan results from November 17, 2020 reviewed independently with 3 hypermetabolic right lower lobe lung nodules consistent with pulmonary metastasis.  Patient completed cycle 8 of FOLFOX on Mar 29, 2021.  She then completed cycle 5 of her 5-FU  pump on May 26, 2021 and XRT on June 01, 2021.  PET scan results from July 17, 2021 reviewed independently with no evidence of disease other than enlarging right lower lobe lung lesion.  Patient completed SBRT to the right lung lesion on August 22, 2021.  Repeat PET scan on July 25, 2021 revealed minimal residual disease in the rectum and likely resolution of lesions in the lung.    ASSESSMENT: Recurrent stage IV rectal cancer, history of follicular lymphoma.  PLAN:    Recurrent stage IV rectal cancer: See oncology history as above.  PET scan results from Mar 21, 2023 reviewed independently and report as  above with clear progression of disease in patient's pelvis as well as multiple pulmonary nodules.  Her CEA also continues to significantly increase and is now 29.8.  We briefly mentioned hospice and end-of-life, but patient wishes to pursue treatment therefore will return to clinic in approximately 2 weeks to initiate cycle 1 of FOLFOX plus Avastin.   Recurrent follicular lymphoma: CT scan results from December 23, 2019 reviewed independently with no obvious evidence of recurrent or progressive disease.  PET scan results as above consistent with rectal cancer metastasis and no evidence of lymphoma.   Patient received Zevalin on July 30, 2017 with not much therapeutic effect.  She subsequently underwent cycles 5 of R-CHOP chemotherapy with Neulasta support completing on December 12, 2017.  Patient continues to be in complete remission.  Neuropathy: Chronic and unchanged.Marland Kitchen Pelvic pain: MRI as above.  Patient was previously given a prescription for hydrocodone, although states tramadol works well for her. Poor appetite: Patient was given a prescription for Megace.    Patient expressed understanding and was in agreement with this plan. She also understands that She can call clinic at any time with any questions, concerns, or complaints.    Cancer Staging  Grade 1 follicular lymphoma of  lymph nodes of multiple regions [facility]) Staging form: Lymphoid Neoplasms, AJCC 6th Edition - Clinical stage from 08/27/2016: Stage IIE - Signed by Jeralyn Ruths, MD on 08/27/2016  Rectal cancer [facility]) Staging form: Colon and Rectum, AJCC 8th Edition - Clinical: Stage IVA Dewaine Oats, [place]) - Signed by Jeralyn Ruths, MD on 11/02/2020   Jeralyn Ruths, MD   03/25/2023 1:50 PM

## 2023-03-26 ENCOUNTER — Other Ambulatory Visit: Payer: Self-pay

## 2023-03-27 ENCOUNTER — Encounter: Payer: Self-pay | Admitting: Oncology

## 2023-03-27 ENCOUNTER — Other Ambulatory Visit: Payer: Self-pay

## 2023-03-27 NOTE — Telephone Encounter (Signed)
Form signed and faxed with confirmation of receipt.

## 2023-04-08 ENCOUNTER — Ambulatory Visit
Admission: RE | Admit: 2023-04-08 | Discharge: 2023-04-08 | Disposition: A | Discharge status: Home / Self Care | Payer: 59 | Source: Ambulatory Visit | Attending: Pulmonary Disease | Admitting: Pulmonary Disease

## 2023-04-08 DIAGNOSIS — I082 Rheumatic disorders of both aortic and tricuspid valves: Secondary | ICD-10-CM | POA: Insufficient documentation

## 2023-04-08 DIAGNOSIS — Q676 Pectus excavatum: Secondary | ICD-10-CM | POA: Insufficient documentation

## 2023-04-08 DIAGNOSIS — R0602 Shortness of breath: Secondary | ICD-10-CM

## 2023-04-08 DIAGNOSIS — R0600 Dyspnea, unspecified: Secondary | ICD-10-CM | POA: Insufficient documentation

## 2023-04-08 DIAGNOSIS — E785 Hyperlipidemia, unspecified: Secondary | ICD-10-CM | POA: Diagnosis not present

## 2023-04-08 LAB — ECHOCARDIOGRAM COMPLETE
Est EF: >55
S' Lateral: 1.3 cm

## 2023-04-08 NOTE — Progress Notes (Signed)
*  PRELIMINARY RESULTS* Echocardiogram 2D Echocardiogram has been performed.  Veronica Boyd 04/08/2023, 11:47 AM

## 2023-04-09 MED FILL — Dexamethasone Sodium Phosphate Inj 100 MG/10ML: INTRAMUSCULAR | Qty: 1 | Status: AC

## 2023-04-10 ENCOUNTER — Inpatient Hospital Stay (HOSPITAL_BASED_OUTPATIENT_CLINIC_OR_DEPARTMENT_OTHER): Payer: 59 | Admitting: Oncology

## 2023-04-10 ENCOUNTER — Inpatient Hospital Stay: Payer: 59

## 2023-04-10 ENCOUNTER — Encounter: Payer: Self-pay | Admitting: Oncology

## 2023-04-10 ENCOUNTER — Inpatient Hospital Stay: Payer: 59 | Attending: Oncology

## 2023-04-10 DIAGNOSIS — D709 Neutropenia, unspecified: Secondary | ICD-10-CM | POA: Insufficient documentation

## 2023-04-10 DIAGNOSIS — C20 Malignant neoplasm of rectum: Secondary | ICD-10-CM | POA: Diagnosis present

## 2023-04-10 DIAGNOSIS — C799 Secondary malignant neoplasm of unspecified site: Secondary | ICD-10-CM | POA: Insufficient documentation

## 2023-04-10 DIAGNOSIS — Z5189 Encounter for other specified aftercare: Secondary | ICD-10-CM | POA: Diagnosis not present

## 2023-04-10 DIAGNOSIS — Z79899 Other long term (current) drug therapy: Secondary | ICD-10-CM | POA: Diagnosis not present

## 2023-04-10 DIAGNOSIS — Z5111 Encounter for antineoplastic chemotherapy: Secondary | ICD-10-CM | POA: Diagnosis present

## 2023-04-10 LAB — CBC WITH DIFFERENTIAL (CANCER CENTER ONLY)
Abs Immature Granulocytes: 0.02 10*3/uL (ref 0.00–0.07)
Basophils Absolute: 0.1 10*3/uL (ref 0.0–0.1)
Basophils Relative: 1 %
Eosinophils Absolute: 0.1 10*3/uL (ref 0.0–0.5)
Eosinophils Relative: 1 %
HCT: 37.8 % (ref 36.0–46.0)
Hemoglobin: 12.3 g/dL (ref 12.0–15.0)
Immature Granulocytes: 0 %
Lymphocytes Relative: 21 %
Lymphs Abs: 1.8 10*3/uL (ref 0.7–4.0)
MCH: 29.9 pg (ref 26.0–34.0)
MCHC: 32.5 g/dL (ref 30.0–36.0)
MCV: 92 fL (ref 80.0–100.0)
Monocytes Absolute: 1 10*3/uL (ref 0.1–1.0)
Monocytes Relative: 12 %
Neutro Abs: 5.7 10*3/uL (ref 1.7–7.7)
Neutrophils Relative %: 65 %
Platelet Count: 284 10*3/uL (ref 150–400)
RBC: 4.11 MIL/uL (ref 3.87–5.11)
RDW: 15.3 % (ref 11.5–15.5)
WBC Count: 8.8 10*3/uL (ref 4.0–10.5)
nRBC: 0 % (ref 0.0–0.2)

## 2023-04-10 LAB — CMP (CANCER CENTER ONLY)
ALT: 11 U/L (ref 0–44)
AST: 18 U/L (ref 15–41)
Albumin: 3.4 g/dL — ABNORMAL LOW (ref 3.5–5.0)
Alkaline Phosphatase: 55 U/L (ref 38–126)
Anion gap: 10 (ref 5–15)
BUN: 10 mg/dL (ref 8–23)
CO2: 23 mmol/L (ref 22–32)
Calcium: 9 mg/dL (ref 8.9–10.3)
Chloride: 105 mmol/L (ref 98–111)
Creatinine: 0.64 mg/dL (ref 0.44–1.00)
GFR, Estimated: >60 mL/min (ref >60)
Glucose, Bld: 137 mg/dL — ABNORMAL HIGH (ref 70–99)
Potassium: 3.4 mmol/L — ABNORMAL LOW (ref 3.5–5.1)
Sodium: 138 mmol/L (ref 135–145)
Total Bilirubin: 0.5 mg/dL (ref 0.3–1.2)
Total Protein: 6.8 g/dL (ref 6.5–8.1)

## 2023-04-10 LAB — PROTEIN, URINE, RANDOM: Total Protein, Urine: 49 mg/dL

## 2023-04-10 MED ORDER — DEXTROSE 5 % IV SOLN
Freq: Once | INTRAVENOUS | Status: AC
  Filled 2023-04-10: qty 250

## 2023-04-10 MED ORDER — FAMOTIDINE 20 MG IN NS 100 ML IVPB
20.0000 mg | Freq: Once | INTRAVENOUS | Status: AC
  Administered 2023-04-10: 20 mg via INTRAVENOUS
  Filled 2023-04-10: qty 100

## 2023-04-10 MED ORDER — FLUOROURACIL CHEMO INJECTION 500 MG/10ML
400.0000 mg/m2 | Freq: Once | INTRAVENOUS | Status: AC
  Administered 2023-04-10: 500 mg via INTRAVENOUS
  Filled 2023-04-10: qty 10

## 2023-04-10 MED ORDER — FAMOTIDINE IN NACL 20-0.9 MG/50ML-% IV SOLN: 20.0000 mg | Freq: Once | INTRAVENOUS | Status: DC

## 2023-04-10 MED ORDER — SODIUM CHLORIDE 0.9 % IV SOLN
Freq: Once | INTRAVENOUS | Status: AC
  Filled 2023-04-10: qty 250

## 2023-04-10 MED ORDER — SODIUM CHLORIDE 0.9 % IV SOLN
5.0000 mg/kg | Freq: Once | INTRAVENOUS | Status: AC
  Administered 2023-04-10: 200 mg via INTRAVENOUS
  Filled 2023-04-10: qty 8

## 2023-04-10 MED ORDER — SODIUM CHLORIDE 0.9 % IV SOLN
2400.0000 mg/m2 | INTRAVENOUS | Status: DC
  Administered 2023-04-10: 3000 mg via INTRAVENOUS
  Filled 2023-04-10: qty 60

## 2023-04-10 MED ORDER — LEUCOVORIN CALCIUM INJECTION 350 MG
500.0000 mg | Freq: Once | INTRAVENOUS | Status: AC
  Administered 2023-04-10: 500 mg via INTRAVENOUS
  Filled 2023-04-10: qty 25

## 2023-04-10 MED ORDER — DIPHENHYDRAMINE HCL 50 MG/ML IJ SOLN
25.0000 mg | Freq: Once | INTRAMUSCULAR | Status: AC
  Administered 2023-04-10: 25 mg via INTRAVENOUS
  Filled 2023-04-10: qty 1

## 2023-04-10 MED ORDER — OXALIPLATIN CHEMO INJECTION 100 MG/20ML
85.0000 mg/m2 | Freq: Once | INTRAVENOUS | Status: AC
  Administered 2023-04-10: 100 mg via INTRAVENOUS
  Filled 2023-04-10: qty 20

## 2023-04-10 MED ORDER — HEPARIN SOD (PORK) LOCK FLUSH 100 UNIT/ML IV SOLN
500.0000 [IU] | Freq: Once | INTRAVENOUS | Status: DC
  Filled 2023-04-10: qty 5

## 2023-04-10 MED ORDER — SODIUM CHLORIDE 0.9 % IV SOLN
10.0000 mg | Freq: Once | INTRAVENOUS | Status: AC
  Administered 2023-04-10: 10 mg via INTRAVENOUS
  Filled 2023-04-10: qty 10

## 2023-04-10 MED ORDER — PALONOSETRON HCL INJECTION 0.25 MG/5ML
0.2500 mg | Freq: Once | INTRAVENOUS | Status: AC
  Administered 2023-04-10: 0.25 mg via INTRAVENOUS
  Filled 2023-04-10: qty 5

## 2023-04-10 MED ORDER — SODIUM CHLORIDE 0.9% FLUSH
10.0000 mL | Freq: Once | INTRAVENOUS | Status: AC
  Administered 2023-04-10: 10 mL via INTRAVENOUS
  Filled 2023-04-10: qty 10

## 2023-04-10 NOTE — Progress Notes (Signed)
[facility]  [phone] [phone]  ID: Veronica Boyd OB: July 15, 1946  MR#: [id number]  UXL#:[id number]  Patient Care Team: Barbette Reichmann, MD as PCP - General (Internal Medicine) Jeralyn Ruths, MD as Consulting Physician (Oncology) Benita Gutter, RN as Oncology Nurse Navigator Salena Saner, MD as Consulting Physician (Pulmonary Disease)  CHIEF COMPLAINT: Recurrent stage IV rectal cancer, history of follicular lymphoma.  INTERVAL HISTORY: Patient returns to clinic today for further evaluation and initiation of cycle 1 of FOLFOX plus Avastin.  She does not complain of weakness or fatigue today.  Her appetite has improved since initiating Megace.  She reports her pain is still well-controlled with tramadol.  She has a chronic peripheral neuropathy, but no other neurologic complaints.  She denies any recent fevers or illnesses.  She denies any chest pain, shortness of breath, or hemoptysis.  She has a chronic cough.  She has pelvic pain with bowel movements.  She denies any nausea, vomiting, constipation or diarrhea.  She has no urinary complaints.  Patient offers no further specific complaints today.  REVIEW OF SYSTEMS:   Review of Systems  Constitutional:  Positive for malaise/fatigue. Negative for fever and weight loss.  Respiratory: Negative.  Negative for cough, hemoptysis and shortness of breath.   Cardiovascular: Negative.  Negative for chest pain and leg swelling.  Gastrointestinal:  Negative for abdominal pain, blood in stool, diarrhea, melena, nausea and vomiting.  Genitourinary: Negative.  Negative for dysuria.  Musculoskeletal: Negative.  Negative for back pain, joint pain and neck pain.  Skin: Negative.  Negative for rash.  Neurological:  Positive for tingling, sensory change and weakness. Negative for focal weakness and headaches.  Psychiatric/Behavioral: Negative.  The patient is not nervous/anxious and does not have  insomnia.     As per HPI. Otherwise, a complete review of systems is negative.   PAST MEDICAL HISTORY: Past Medical History:  Diagnosis Date   Anemia due to antineoplastic chemotherapy    Arthritis    Bronchial obstruction    Cataract    bilateral   Chemotherapy-induced neuropathy (HCC)    Chicken pox    Colon polyp    Dyspnea    Follicular lymphoma (HCC) 08/2016   lymph nodes    GERD (gastroesophageal reflux disease)    Hyperlipidemia    Lung nodule    Lung nodules 09/2022   right upper lobe   Mass of right chest wall    Osteoporosis    Protein-calorie malnutrition, severe (HCC)    Rectal carcinoma (HCC)    Thrombocytopenia (HCC)     PAST SURGICAL HISTORY: Past Surgical History:  Procedure Laterality Date   AXILLARY LYMPH NODE DISSECTION Right 08/21/2016   Procedure: AXILLARY LYMPH NODE excision;  Surgeon: Nadeen Landau, MD;  Location: [facility] ORS;  Service: General;  Laterality: Right;   CATARACT EXTRACTION, BILATERAL Bilateral    COLONOSCOPY N/A 09/19/2020   Procedure: COLONOSCOPY;  Surgeon: Regis Bill, MD;  Location: [facility] ENDOSCOPY;  Service: Endoscopy;  Laterality: N/A;   PORTA CATH INSERTION N/A 09/16/2017   Procedure: PORTA CATH INSERTION;  Surgeon: Annice Needy, MD;  Location: [facility] INVASIVE CV LAB;  Service: Cardiovascular;  Laterality: N/A;   TONSILLECTOMY     TOTAL HIP ARTHROPLASTY Left 1992    FAMILY HISTORY: Family History  Problem Relation Age of Onset   Diabetes Sister    Lung cancer Brother    Diabetes Brother    Basal cell carcinoma Daughter    Breast  cancer Paternal Aunt     ADVANCED DIRECTIVES (Y/N):  N  HEALTH MAINTENANCE: Social History   Tobacco Use   Smoking status: Never   Smokeless tobacco: Never  Vaping Use   Vaping Use: Never used  Substance Use Topics   Alcohol use: No   Drug use: No     Colonoscopy:  PAP:  Bone density:  Lipid panel:  No Known Allergies  Current Outpatient Medications  Medication  Sig Dispense Refill   cyanocobalamin (VITAMIN B12) 1000 MCG tablet Take 1,000 mcg by mouth daily.     diclofenac sodium (VOLTAREN) 1 % GEL Apply 2 g topically 2 (two) times daily as needed.     diphenoxylate-atropine (LOMOTIL) 2.5-0.025 MG tablet TAKE 2 TABLETS BY MOUTH 4 (FOUR) TIMES DAILY AS NEEDED FOR DIARRHEA OR LOOSE STOOLS. 60 tablet 1   gabapentin (NEURONTIN) 300 MG capsule TAKE 1 CAPSULE BY MOUTH TWICE A DAY 60 capsule 2   ibandronate (BONIVA) 150 MG tablet Take 150 mg by mouth every 30 (thirty) days.     lidocaine-prilocaine (EMLA) cream Apply 1 Application topically as needed. Apply to port 1 hour prior to use as needed 30 g 2   megestrol (MEGACE) 40 MG tablet Take 1 tablet (40 mg total) by mouth daily. 90 tablet 1   Multiple Vitamin (MULTIVITAMIN) capsule Take 1 capsule by mouth daily.     ondansetron (ZOFRAN) 8 MG tablet Take 1 tablet (8 mg total) by mouth every 8 (eight) hours as needed for nausea or vomiting. Start on the third day after chemotherapy. 60 tablet 2   pantoprazole (PROTONIX) 20 MG tablet Take 1 tablet by mouth daily as needed for heartburn.     potassium chloride (KLOR-CON M) 10 MEQ tablet Take 1 tablet (10 mEq total) by mouth daily. 14 tablet 0   simvastatin (ZOCOR) 20 MG tablet Take 20 mg by mouth at bedtime.     tiZANidine (ZANAFLEX) 2 MG tablet TAKE 1 TABLET BY MOUTH NIGHTLY AS NEEDED FOR MUSCLE SPASMS. 90 tablet 1   traMADol-acetaminophen (ULTRACET) 37.5-325 MG tablet Take 1 tablet by mouth every 8 (eight) hours as needed.     amoxicillin-clavulanate (AUGMENTIN) 500-125 MG tablet Take 1 tablet by mouth in the morning and at bedtime. (Patient not taking: Reported on 01/03/2023) 20 tablet 0   methylPREDNISolone (MEDROL DOSEPAK) 4 MG TBPK tablet Use as directed. (Patient not taking: Reported on 01/03/2023) 21 each 0   Potassium 99 MG TABS Take 1 tablet by mouth as needed. (Patient not taking: Reported on 01/03/2023)     No current facility-administered medications for  this visit.   Facility-Administered Medications Ordered in Other Visits  Medication Dose Route Frequency Provider Last Rate Last Admin   fluorouracil (ADRUCIL) 3,000 mg in sodium chloride 0.9 % 90 mL chemo infusion  2,400 mg/m2 (Treatment Plan Recorded) Intravenous 1 day or 1 dose Jeralyn Ruths, MD   Infusion Verify at 04/10/23 1421   heparin lock flush 100 unit/mL  500 Units Intravenous Once Jeralyn Ruths, MD       heparin lock flush 100 unit/mL  500 Units Intracatheter PRN Jeralyn Ruths, MD       heparin lock flush 100 unit/mL  500 Units Intravenous Once Jeralyn Ruths, MD       heparin lock flush 100 unit/mL  500 Units Intravenous Once Jeralyn Ruths, MD       heparin lock flush 100 unit/mL  500 Units Intravenous Once Jeralyn Ruths, MD  sodium chloride flush (NS) 0.9 % injection 10 mL  10 mL Intravenous PRN Jeralyn Ruths, MD   10 mL at 12/04/17 0901   sodium chloride flush (NS) 0.9 % injection 10 mL  10 mL Intracatheter PRN Jeralyn Ruths, MD       sodium chloride flush (NS) 0.9 % injection 10 mL  10 mL Intravenous PRN Jeralyn Ruths, MD   10 mL at 04/24/21 0839   yttrium-90 injection 22.3 millicurie  22.3 millicurie Intravenous Once Chrystal, Sherrine Maples, MD        OBJECTIVE: Vitals:   04/10/23 0927  BP: 95/66  Pulse: (!) 103  Resp: 19  Temp: 97.9 F (36.6 C)  SpO2: 97%       Body mass index is 17.66 kg/m.    ECOG FS:0 - Asymptomatic  General: Thin, no acute distress. Eyes: Pink conjunctiva, anicteric sclera. HEENT: Normocephalic, moist mucous membranes. Lungs: No audible wheezing or coughing. Heart: Regular rate and rhythm. Abdomen: Soft, nontender, no obvious distention. Musculoskeletal: No edema, cyanosis, or clubbing. Neuro: Alert, answering all questions appropriately. Cranial nerves grossly intact. Skin: No rashes or petechiae noted. Psych: Normal affect.  LAB RESULTS:  Lab Results  Component Value Date   NA 138  04/10/2023   K 3.4 (L) 04/10/2023   CL 105 04/10/2023   CO2 23 04/10/2023   GLUCOSE 137 (H) 04/10/2023   BUN 10 04/10/2023   CREATININE 0.64 04/10/2023   CALCIUM 9.0 04/10/2023   PROT 6.8 04/10/2023   ALBUMIN 3.4 (L) 04/10/2023   AST 18 04/10/2023   ALT 11 04/10/2023   ALKPHOS 55 04/10/2023   BILITOT 0.5 04/10/2023   GFRNONAA >60 04/10/2023   GFRAA >60 06/28/2020    Lab Results  Component Value Date   WBC 8.8 04/10/2023   NEUTROABS 5.7 04/10/2023   HGB 12.3 04/10/2023   HCT 37.8 04/10/2023   MCV 92.0 04/10/2023   PLT 284 04/10/2023     STUDIES: ECHOCARDIOGRAM COMPLETE  Result Date: 04/08/2023    ECHOCARDIOGRAM REPORT   Patient Name:   CHEYEANNE JARMAN [facility] Date of Exam: 04/08/2023 Medical Rec #:  [id number]         Height:       58.0 in Accession #:    [id number]        Weight:       83.0 lb Date of Birth:  [birth year]          BSA:          1.253 m Patient Age:    77 years          BP:           107/53 mmHg Patient Gender: F                 HR:           99 bpm. Exam Location:  [facility] Procedure: 2D Echo, Cardiac Doppler and Color Doppler Indications:     Dyspnea  History:         Patient has no prior history of Echocardiogram examinations.                  Signs/Symptoms:Dyspnea; Risk Factors:Dyslipidemia. Pectus                  Excavatum.  Sonographer:     Mikki Harbor Referring Phys:  [id number] CARMEN L Jayme Cloud Diagnosing Phys: Yvonne Kendall MD  Sonographer Comments: Technically challenging study due to limited acoustic windows, no apical  window and suboptimal parasternal window. Image acquisition challenging due to patient body habitus. IMPRESSIONS  1. Left ventricular ejection fraction, by estimation, is >55%. The left ventricle has normal function. Left ventricular endocardial border not optimally defined to evaluate regional wall motion. Left ventricular diastolic function could not be evaluated.  2. Right ventricular systolic function is normal. The right ventricular size is normal.   3. The mitral valve was not well visualized. No evidence of mitral valve regurgitation.  4. Tricuspid valve regurgitation not well assessed.  5. The aortic valve was not well visualized. Aortic valve regurgitation not well assessed.  6. Pulmonic valve regurgitation not well assessed. FINDINGS  Left Ventricle: Left ventricular ejection fraction, by estimation, is >55%. The left ventricle has normal function. Left ventricular endocardial border not optimally defined to evaluate regional wall motion. The left ventricular internal cavity size was  normal in size. There is borderline left ventricular hypertrophy. Left ventricular diastolic function could not be evaluated. Right Ventricle: The right ventricular size is normal. No increase in right ventricular wall thickness. Right ventricular systolic function is normal. Left Atrium: Left atrial size was not well visualized. Right Atrium: Right atrial size was not well visualized. Pericardium: There is no evidence of pericardial effusion. Mitral Valve: The mitral valve was not well visualized. No evidence of mitral valve regurgitation. Tricuspid Valve: The tricuspid valve is not well visualized. Tricuspid valve regurgitation not well assessed. Aortic Valve: The aortic valve was not well visualized. Aortic valve regurgitation not well assessed. Pulmonic Valve: The pulmonic valve was not well visualized. Pulmonic valve regurgitation not well assessed. Aorta: The aortic root is normal in size and structure. Pulmonary Artery: The pulmonary artery is not well seen. Venous: The inferior vena cava was not well visualized. IAS/Shunts: The interatrial septum was not well visualized.  LEFT VENTRICLE PLAX 2D LVIDd:         2.40 cm LVIDs:         1.30 cm LV PW:         0.90 cm LV IVS:        1.00 cm LVOT diam:     1.80 cm LVOT Area:     2.54 cm  LEFT ATRIUM         Index LA diam:    2.00 cm 1.60 cm/m   AORTA Ao Root diam: 2.80 cm  SHUNTS Systemic Diam: 1.80 cm Yvonne Kendall MD  Electronically signed by Yvonne Kendall MD Signature Date/Time: 04/08/2023/12:51:55 PM    Final    NM PET Image Restag (PS) Skull Base To Thigh  Result Date: 03/21/2023 CLINICAL DATA:  Subsequent treatment strategy for rectal cancer. Recurrence on recent pelvic MRI. History of lymphoma. EXAM: NUCLEAR MEDICINE PET SKULL BASE TO THIGH TECHNIQUE: 5.73 mCi F-18 FDG was injected intravenously. Full-ring PET imaging was performed from the skull base to thigh after the radiotracer. CT data was obtained and used for attenuation correction and anatomic localization. Fasting blood glucose: 82 mg/dl COMPARISON:  MR pelvis 03/12/2023, chest CT 11/27/2022 and PET-CT 04/04/2022. FINDINGS: Mediastinal blood pool activity: SUV max 1.6 NECK: No hypermetabolic cervical lymph nodes are identified. No suspicious activity identified within the pharyngeal mucosal space. Incidental CT findings: none CHEST: There are no hypermetabolic mediastinal, hilar or axillary lymph nodes. There has been substantial interval enlargement of a previously demonstrated right lower lobe infrahilar mass, now measuring approximately 6.0 x 4.6 cm on image 60/4. This has significantly enlarged compared with the most recent chest CT of 4 months ago. This  mass demonstrates heterogeneous hypermetabolic activity with an SUV max of 15.1. In addition, there are several other enlarging pulmonary nodules bilaterally with hypermetabolic activity. For example, there is 1.1 cm nodule in the right lower lobe on image 63/4. In the left lower lobe, there is a 1.0 cm nodule on image 59/4 (SUV max 2.3). Incidental CT findings: Underlying centrilobular and paraseptal emphysema with chronic central airway thickening. Right IJ Port-A-Cath extends to the superior cavoatrial junction. There is atherosclerosis of the aorta, great vessels and coronary arteries. ABDOMEN/PELVIS: There is no hypermetabolic activity within the liver, adrenal glands, spleen or pancreas. The recurrent  rectal mass seen on recent pelvic MRI is intensely hypermetabolic (SUV max 17.3). This has enlarged and demonstrates increased metabolic activity compared with the prior PET-CT. As suggested on MRI, this may invade the posterior wall of the vagina. No hypermetabolic lymph nodes are identified in the abdomen or pelvis. There is a small right inguinal lymph node with low level metabolic activity, nonspecific. Incidental CT findings: Aortic and branch vessel atherosclerosis. No ascites or peritoneal nodularity. SKELETON: There is no hypermetabolic activity to suggest osseous metastatic disease. Incidental CT findings: Previous left total hip arthroplasty. IMPRESSION: 1. Enlarging and hypermetabolic rectal mass consistent with local recurrence of rectal cancer. This may invade the posterior wall of the vagina. 2. Significant enlargement of previously demonstrated right lower lobe lung mass which is hypermetabolic, consistent with malignancy. There are several other enlarging pulmonary nodules bilaterally with hypermetabolic activity, also consistent with metastatic disease. Consider tissue sampling if not previously performed given the patient's history of lymphoma and the rapid progression over the last 4 months. Primary bronchogenic carcinoma would be a consideration as well. 3. No evidence of metastatic disease in the neck, abdomen or pelvis. 4. Aortic Atherosclerosis (ICD10-I70.0) and Emphysema (ICD10-J43.9). Electronically Signed   By: Carey Bullocks M.D.   On: 03/21/2023 15:09   MR PELVIS W WO CONTRAST  Result Date: 03/15/2023 CLINICAL DATA:  Rectal cancer, history of lymphoma EXAM: MRI PELVIS WITHOUT AND WITH CONTRAST TECHNIQUE: Multiplanar multisequence MR imaging of the pelvis was performed both before and after administration of intravenous contrast. CONTRAST:  4mL GADAVIST GADOBUTROL 1 MMOL/ML IV SOLN COMPARISON:  CT abdomen/pelvis dated 06/01/2022 FINDINGS: Dedicated rectal cancer protocol not  performed. This limits evaluation. Urinary Tract:  Bladder is underdistended. Bowel: Long segment concentric wall thickening/mass involving the lower rectum, measuring at least 5.4 cm in length (series 7/image 16). Tumor extends to the internal anal sphincter. Direct perirectal extension anteriorly through the muscularis propria at least 2.0 cm (series 4/image 24), with suspected invasion into the posterior aspect of the vagina. 5 mm distance between the tumor in the anterior mesorectal fascia (series 4/image 21). Vascular/Lymphatic: No evidence of aneurysm. A single left anterior perirectal node measuring 4 mm short axis is present (series 4/image 17). No convincing lymphadenopathy greater than 5 mm. Reproductive: Uterus is poorly evaluated but grossly unremarkable. No adnexal masses. Other:  No pelvic ascites. Musculoskeletal: Mild degenerative changes of the lower lumbar spine. Left hip arthroplasty with susceptibility artifact. 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 Suspected tumor invasion into the posterior aspect of the vagina. Direct visualization suggested for confirmation. Although dedicated rectal protocol not performed, the appearance favors T4 N0 disease on the current study. Electronically Signed   By: Charline Bills M.D.   On: 03/15/2023 01:20    ONCOLOGY HISTORY:  Biopsy confirmed rectal cancer.  [facility] completed at [facility] on October 11, 2020 revealed extension of the tumor beyond the wall into the rectovaginal recess with suspected invasion of the vaginal wall posteriorly.  Tumor also appears to involve the internal anal sphincter.  There are also numerous prominent presacral and mesorectal lymph nodes highly suspicious for malignancy.  PET scan results from November 17, 2020 reviewed independently with 3 hypermetabolic right lower lobe lung nodules consistent with pulmonary metastasis.  Patient completed cycle 8 of FOLFOX on Mar 29, 2021.  She then completed cycle 5 of her 5-FU pump on May 26, 2021 and XRT on June 01, 2021.  PET scan results from July 17, 2021 reviewed independently with no evidence of disease other than enlarging right lower lobe lung lesion.  Patient completed SBRT to the right lung lesion on August 22, 2021.  Repeat PET scan on July 25, 2021 revealed minimal residual disease in the rectum and likely resolution of lesions in the lung.    ASSESSMENT: Recurrent stage IV rectal cancer, history of follicular lymphoma.  PLAN:    Recurrent stage IV rectal cancer: See oncology history as above.  PET scan results from Mar 21, 2023 reviewed independently and reported as above with clear progression of disease in patient's pelvis as well as multiple pulmonary nodules.  Her CEA also continues to significantly increase and is now 29.8.  We briefly mentioned hospice and end-of-life, but patient wishes to pursue treatment.  Proceed with cycle 1 of FOLFOX plus Avastin today.  Return to clinic in 2 days for pump removal and then in 2 weeks for further evaluation and consideration of cycle 2.    Recurrent follicular lymphoma: CT scan results from December 23, 2019 reviewed independently with no obvious evidence of recurrent or progressive disease.  PET scan results as above consistent with rectal cancer metastasis and no evidence of lymphoma.   Patient received Zevalin on July 30, 2017 with not much therapeutic effect.  She subsequently underwent cycles 5 of R-CHOP chemotherapy with Neulasta support completing on December 12, 2017.  Patient continues to be in complete remission.  Neuropathy: Chronic and unchanged. Pelvic pain: MRI as above.  Continue tramadol as prescribed.   Poor appetite: Improved.  Continue Megace. Hypokalemia: Mild, monitor.    Patient expressed understanding and was in agreement with this plan. She also understands that She can call clinic at any time with any questions, concerns, or  complaints.    Cancer Staging  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 [facility]) Staging form: Lymphoid Neoplasms, AJCC 6th Edition - Clinical stage from 08/27/2016: Stage IIE - Signed by Jeralyn Ruths, MD on 08/27/2016  Rectal cancer [facility]) Staging form: Colon and Rectum, AJCC 8th Edition - Clinical: Stage IVA Dewaine Oats, [place]) - Signed by Jeralyn Ruths, MD on 11/02/2020   Jeralyn Ruths, MD   04/10/2023 2:25 PM

## 2023-04-10 NOTE — Patient Instructions (Signed)
[facility]  Discharge Instructions: Thank you for choosing [facility] to provide your oncology and hematology care.  If you have a lab appointment with the Cancer Center, please go directly to the Cancer Center and check in at the registration area.  Wear comfortable clothing and clothing appropriate for easy access to any Portacath or PICC line.   We strive to give you quality time with your provider. You may need to reschedule your appointment if you arrive late (15 or more minutes).  Arriving late affects you and other patients whose appointments are after yours.  Also, if you miss three or more appointments without notifying the office, you may be dismissed from the clinic at the provider's discretion.      For prescription refill requests, have your pharmacy contact our office and allow 72 hours for refills to be completed.     To help prevent nausea and vomiting after your treatment, we encourage you to take your nausea medication as directed.  BELOW ARE SYMPTOMS THAT SHOULD BE REPORTED IMMEDIATELY: *FEVER GREATER THAN 100.4 F (38 C) OR HIGHER *CHILLS OR SWEATING *NAUSEA AND VOMITING THAT IS NOT CONTROLLED WITH YOUR NAUSEA MEDICATION *UNUSUAL SHORTNESS OF BREATH *UNUSUAL BRUISING OR BLEEDING *URINARY PROBLEMS (pain or burning when urinating, or frequent urination) *BOWEL PROBLEMS (unusual diarrhea, constipation, pain near the anus) TENDERNESS IN MOUTH AND THROAT WITH OR WITHOUT PRESENCE OF ULCERS (sore throat, sores in mouth, or a toothache) UNUSUAL RASH, SWELLING OR PAIN  UNUSUAL VAGINAL DISCHARGE OR ITCHING   Items with * indicate a potential emergency and should be followed up as soon as possible or go to the Emergency Department if any problems should occur.  Please show the CHEMOTHERAPY ALERT CARD or IMMUNOTHERAPY ALERT CARD at check-in to the Emergency Department and triage nurse.  Should you have questions after your visit  or need to cancel or reschedule your appointment, please contact [facility]  [phone] and follow the prompts.  Office hours are 8:00 a.m. to 4:30 p.m. Monday - Friday. Please note that voicemails left after 4:00 p.m. may not be returned until the following business day.  We are closed weekends and major holidays. You have access to a nurse at all times for urgent questions. Please call the main number to the clinic [phone] and follow the prompts.  For any non-urgent questions, you may also contact your provider using MyChart. We now offer e-Visits for anyone 18 and older to request care online for non-urgent symptoms. For details visit [email or web address].   Also download the MyChart app! Go to the app store, search "MyChart", open the app, select [place], and log in with your MyChart username and password.    

## 2023-04-12 ENCOUNTER — Inpatient Hospital Stay: Payer: 59

## 2023-04-12 ENCOUNTER — Other Ambulatory Visit: Payer: Self-pay | Admitting: Oncology

## 2023-04-12 VITALS — BP 112/74 | HR 110 | Temp 99.9°F | Resp 18

## 2023-04-12 DIAGNOSIS — C20 Malignant neoplasm of rectum: Secondary | ICD-10-CM

## 2023-04-12 DIAGNOSIS — Z5111 Encounter for antineoplastic chemotherapy: Secondary | ICD-10-CM | POA: Diagnosis not present

## 2023-04-12 MED ORDER — SODIUM CHLORIDE 0.9% FLUSH
10.0000 mL | INTRAVENOUS | Status: DC | PRN
  Administered 2023-04-12: 10 mL
  Filled 2023-04-12: qty 10

## 2023-04-12 MED ORDER — HEPARIN SOD (PORK) LOCK FLUSH 100 UNIT/ML IV SOLN
500.0000 [IU] | Freq: Once | INTRAVENOUS | Status: AC | PRN
  Administered 2023-04-12: 500 [IU]
  Filled 2023-04-12: qty 5

## 2023-04-15 ENCOUNTER — Telehealth: Payer: Self-pay | Admitting: *Deleted

## 2023-04-15 DIAGNOSIS — R197 Diarrhea, unspecified: Secondary | ICD-10-CM

## 2023-04-15 DIAGNOSIS — R112 Nausea with vomiting, unspecified: Secondary | ICD-10-CM

## 2023-04-15 DIAGNOSIS — C20 Malignant neoplasm of rectum: Secondary | ICD-10-CM

## 2023-04-15 NOTE — Telephone Encounter (Signed)
Daughter Zella Ball called to report that patient has had a very rough weekend and is continuing today. Patient having shortness of breath, has to stop after 10 - 15 steps to get her breath. She is having diarrhea since Friday and it gets just a little better so as she takes her anti diarrheal medicine. She is not eating or drinking since Friday She is also having dizziness. She does not know if she has any cyanosis or not. She did not mention that spt has fever and she did not want to call her mother to disturb her at this time. Please return call to Robin.

## 2023-04-15 NOTE — Telephone Encounter (Signed)
Rn spoke with Leotis Shames, NP- ok to sch pt for port lab/ smc/ iv fluids tomorrow-. Rn spoke with daughter. Pt can come as early as 830 tom. Apts scheduled.

## 2023-04-16 ENCOUNTER — Inpatient Hospital Stay: Payer: 59

## 2023-04-16 ENCOUNTER — Other Ambulatory Visit: Payer: Self-pay

## 2023-04-16 ENCOUNTER — Inpatient Hospital Stay (HOSPITAL_BASED_OUTPATIENT_CLINIC_OR_DEPARTMENT_OTHER): Payer: 59 | Admitting: Nurse Practitioner

## 2023-04-16 VITALS — BP 115/73 | HR 97

## 2023-04-16 VITALS — BP 110/58 | HR 56 | Temp 98.2°F | Resp 18 | Ht <= 58 in | Wt 82.0 lb

## 2023-04-16 DIAGNOSIS — C20 Malignant neoplasm of rectum: Secondary | ICD-10-CM | POA: Diagnosis not present

## 2023-04-16 DIAGNOSIS — Z09 Encounter for follow-up examination after completed treatment for conditions other than malignant neoplasm: Secondary | ICD-10-CM

## 2023-04-16 DIAGNOSIS — Z5189 Encounter for other specified aftercare: Secondary | ICD-10-CM

## 2023-04-16 DIAGNOSIS — I499 Cardiac arrhythmia, unspecified: Secondary | ICD-10-CM

## 2023-04-16 DIAGNOSIS — Z5111 Encounter for antineoplastic chemotherapy: Secondary | ICD-10-CM | POA: Diagnosis not present

## 2023-04-16 DIAGNOSIS — E860 Dehydration: Secondary | ICD-10-CM

## 2023-04-16 DIAGNOSIS — R112 Nausea with vomiting, unspecified: Secondary | ICD-10-CM

## 2023-04-16 DIAGNOSIS — R197 Diarrhea, unspecified: Secondary | ICD-10-CM

## 2023-04-16 LAB — CBC WITH DIFFERENTIAL (CANCER CENTER ONLY)
Abs Immature Granulocytes: 0.02 10*3/uL (ref 0.00–0.07)
Basophils Absolute: 0 10*3/uL (ref 0.0–0.1)
Basophils Relative: 1 %
Eosinophils Absolute: 0.1 10*3/uL (ref 0.0–0.5)
Eosinophils Relative: 2 %
HCT: 38 % (ref 36.0–46.0)
Hemoglobin: 12.7 g/dL (ref 12.0–15.0)
Immature Granulocytes: 0 %
Lymphocytes Relative: 34 %
Lymphs Abs: 2 10*3/uL (ref 0.7–4.0)
MCH: 30.2 pg (ref 26.0–34.0)
MCHC: 33.4 g/dL (ref 30.0–36.0)
MCV: 90.5 fL (ref 80.0–100.0)
Monocytes Absolute: 0.4 10*3/uL (ref 0.1–1.0)
Monocytes Relative: 6 %
Neutro Abs: 3.4 10*3/uL (ref 1.7–7.7)
Neutrophils Relative %: 57 %
Platelet Count: 236 10*3/uL (ref 150–400)
RBC: 4.2 MIL/uL (ref 3.87–5.11)
RDW: 14.2 % (ref 11.5–15.5)
WBC Count: 5.8 10*3/uL (ref 4.0–10.5)
nRBC: 0 % (ref 0.0–0.2)

## 2023-04-16 LAB — CMP (CANCER CENTER ONLY)
ALT: 12 U/L (ref 0–44)
AST: 23 U/L (ref 15–41)
Albumin: 3.6 g/dL (ref 3.5–5.0)
Alkaline Phosphatase: 52 U/L (ref 38–126)
Anion gap: 10 (ref 5–15)
BUN: 16 mg/dL (ref 8–23)
CO2: 22 mmol/L (ref 22–32)
Calcium: 8.9 mg/dL (ref 8.9–10.3)
Chloride: 102 mmol/L (ref 98–111)
Creatinine: 0.74 mg/dL (ref 0.44–1.00)
GFR, Estimated: >60 mL/min (ref >60)
Glucose, Bld: 120 mg/dL — ABNORMAL HIGH (ref 70–99)
Potassium: 3.3 mmol/L — ABNORMAL LOW (ref 3.5–5.1)
Sodium: 134 mmol/L — ABNORMAL LOW (ref 135–145)
Total Bilirubin: 0.7 mg/dL (ref 0.3–1.2)
Total Protein: 6.8 g/dL (ref 6.5–8.1)

## 2023-04-16 LAB — MAGNESIUM: Magnesium: 1.9 mg/dL (ref 1.7–2.4)

## 2023-04-16 MED ORDER — SODIUM CHLORIDE 0.9 % IV SOLN
INTRAVENOUS | Status: DC
  Filled 2023-04-16 (×2): qty 250

## 2023-04-16 MED ORDER — SODIUM CHLORIDE 0.9% FLUSH
10.0000 mL | Freq: Once | INTRAVENOUS | Status: AC
  Administered 2023-04-16: 10 mL via INTRAVENOUS
  Filled 2023-04-16: qty 10

## 2023-04-16 MED ORDER — HEPARIN SOD (PORK) LOCK FLUSH 100 UNIT/ML IV SOLN
500.0000 [IU] | Freq: Once | INTRAVENOUS | Status: AC
  Administered 2023-04-16: 500 [IU] via INTRAVENOUS
  Filled 2023-04-16: qty 5

## 2023-04-16 NOTE — Progress Notes (Signed)
Symptom Management Clinic  [facility] at [facility] A Department of the [place]. [facility] [address], [address] [place], [place] [address] [phone] (phone) [phone] (fax)  Patient Care Team: Barbette Reichmann, MD as PCP - General (Internal Medicine) Jeralyn Ruths, MD as Consulting Physician (Oncology) Benita Gutter, RN as Oncology Nurse Navigator Salena Saner, MD as Consulting Physician (Pulmonary Disease)   Name of the patient: Veronica Boyd  [id number]  10-25-1946   Date of visit: 04/16/23  Diagnosis- Recurrent stage IV rectal cancer  Chief complaint/ Reason for visit- weakness  Heme/Onc history:  Oncology History Overview Note  Initial diagnosis 2014. S/p radiation 3 years ago.  Noted increase lymphadenopathy and biopsy was recommended.  Unfortunately she refused and was lost to follow-up. Return to clinic in September 2017 with increased swelling surrounding her right collarbone.  Had biopsy that was suggestive of follicular lymphoma but not definitive.  Had PET scan to complete the staging work-up.  PET scan showed hypermetabolic adenopathy in bilateral subpectoral and axillary regions, right internal mammary chain and right paratracheal and subclavicular regions.  There is no evidence of hypermetabolic lymphadenopathy within the abdomen or pelvis.  Received Zevalin on July 30, 2017 with not much therapeutic effect.  She subsequently underwent cycles 5 of R CHOP chemotherapy with Neulasta support completing on December 12, 2017.  No further intervention or treatments are needed at this time.  PET scan results from February 19, 2018 reviewed independently and reported as above with no suspicious finding for active lymphoma.      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)  07/24/2016 Initial Diagnosis   Follicular lymphoma (HCC)   Rectal cancer (HCC)  11/02/2020 Initial  Diagnosis   Rectal cancer (HCC)   11/02/2020 Cancer Staging   Staging form: Colon and Rectum, AJCC 8th Edition - Clinical: Stage IVA Dewaine Oats, [place]) - Signed by Jeralyn Ruths, MD on 11/02/2020   11/16/2020 - 03/31/2021 Chemotherapy         04/24/2021 - 05/26/2021 Chemotherapy   Patient is on Treatment Plan : RECTAL 5FU IVCI D1-5 + XRT     04/10/2023 -  Chemotherapy   Patient is on Treatment Plan : COLORECTAL FOLFOX + Bevacizumab q14d       Interval history- Patient is 77 year old female who presents to clinic for concerns of weakness and diarrhea after her first cycle of chemotherapy on 04/10/23 in which she received folfox + avastin. Her pump was removed on 04/12/23. Per patient, that night she began feeling increasingly weak and generally poorly. She was dizzy, nauseous, and had some diarrhea. She had loss of appetite and consumed very little food or liquids. Per her daughter, who accompanies her today, symptoms have gradually improved over the weekend and since yesterday. She has ongoing shortness of breath which is episodic. Worse with exertion. She has seen Dr Jayme Cloud for her symptoms. No chest pain.    ECOG FS:3 - Symptomatic, >50% confined to bed  Review of systems- Review of Systems  Constitutional:  Positive for malaise/fatigue. Negative for chills, fever and weight loss.  HENT:  Negative for hearing loss, nosebleeds, sore throat and tinnitus.   Respiratory:  Positive for shortness of breath. Negative for cough, hemoptysis, sputum production and wheezing.   Cardiovascular:  Negative for chest pain, palpitations and leg swelling.  Gastrointestinal:  Positive for diarrhea. Negative for abdominal pain, blood in stool, constipation, melena, nausea and vomiting.  Genitourinary:  Negative for dysuria and urgency.  Musculoskeletal:  Negative for back pain, falls, joint pain and myalgias.  Skin:  Negative for itching and rash.  Neurological:  Negative for dizziness, tingling,  sensory change, loss of consciousness, weakness and headaches.  Endo/Heme/Allergies:  Negative for environmental allergies. Does not bruise/bleed easily.  Psychiatric/Behavioral:  Negative for depression. The patient is nervous/anxious. The patient does not have insomnia.     No Known Allergies  Past Medical History:  Diagnosis Date   Anemia due to antineoplastic chemotherapy    Arthritis    Bronchial obstruction    Cataract    bilateral   Chemotherapy-induced neuropathy (HCC)    Chicken pox    Colon polyp    Dyspnea    Follicular lymphoma (HCC) 08/2016   lymph nodes    GERD (gastroesophageal reflux disease)    Hyperlipidemia    Lung nodule    Lung nodules 09/2022   right upper lobe   Mass of right chest wall    Osteoporosis    Protein-calorie malnutrition, severe (HCC)    Rectal carcinoma (HCC)    Thrombocytopenia (HCC)     Past Surgical History:  Procedure Laterality Date   AXILLARY LYMPH NODE DISSECTION Right 08/21/2016   Procedure: AXILLARY LYMPH NODE excision;  Surgeon: Nadeen Landau, MD;  Location: [facility] ORS;  Service: General;  Laterality: Right;   CATARACT EXTRACTION, BILATERAL Bilateral    COLONOSCOPY N/A 09/19/2020   Procedure: COLONOSCOPY;  Surgeon: Regis Bill, MD;  Location: [facility] ENDOSCOPY;  Service: Endoscopy;  Laterality: N/A;   PORTA CATH INSERTION N/A 09/16/2017   Procedure: PORTA CATH INSERTION;  Surgeon: Annice Needy, MD;  Location: [facility] INVASIVE CV LAB;  Service: Cardiovascular;  Laterality: N/A;   TONSILLECTOMY     TOTAL HIP ARTHROPLASTY Left 1992    Social History   Socioeconomic History   Marital status: Divorced    Spouse name: Not on file   Number of children: 2   Years of education: Not on file   Highest education level: Not on file  Occupational History   Not on file  Tobacco Use   Smoking status: Never   Smokeless tobacco: Never  Vaping Use   Vaping Use: Never used  Substance and Sexual Activity   Alcohol use: No    Drug use: No   Sexual activity: Not Currently  Other Topics Concern   Not on file  Social History Narrative   Lives with daughter   Social Determinants of Health   Financial Resource Strain: Not on file  Food Insecurity: Not on file  Transportation Needs: Not on file  Physical Activity: Not on file  Stress: Not on file  Social Connections: Not on file  Intimate Partner Violence: Not on file    Family History  Problem Relation Age of Onset   Diabetes Sister    Lung cancer Brother    Diabetes Brother    Basal cell carcinoma Daughter    Breast cancer Paternal Aunt     Current Outpatient Medications:    amoxicillin-clavulanate (AUGMENTIN) 500-125 MG tablet, Take 1 tablet by mouth in the morning and at bedtime. (Patient not taking: Reported on 01/03/2023), Disp: 20 tablet, Rfl: 0   cyanocobalamin (VITAMIN B12) 1000 MCG tablet, Take 1,000 mcg by mouth daily., Disp: , Rfl:    diclofenac sodium (VOLTAREN) 1 % GEL, Apply 2 g topically 2 (two) times daily as needed., Disp: , Rfl:    diphenoxylate-atropine (LOMOTIL) 2.5-0.025 MG tablet, TAKE  2 TABLETS BY MOUTH 4 (FOUR) TIMES DAILY AS NEEDED FOR DIARRHEA OR LOOSE STOOLS., Disp: 60 tablet, Rfl: 1   gabapentin (NEURONTIN) 300 MG capsule, TAKE 1 CAPSULE BY MOUTH TWICE A DAY, Disp: 60 capsule, Rfl: 2   ibandronate (BONIVA) 150 MG tablet, Take 150 mg by mouth every 30 (thirty) days., Disp: , Rfl:    lidocaine-prilocaine (EMLA) cream, Apply 1 Application topically as needed. Apply to port 1 hour prior to use as needed, Disp: 30 g, Rfl: 2   megestrol (MEGACE) 40 MG tablet, Take 1 tablet (40 mg total) by mouth daily., Disp: 90 tablet, Rfl: 1   methylPREDNISolone (MEDROL DOSEPAK) 4 MG TBPK tablet, Use as directed. (Patient not taking: Reported on 01/03/2023), Disp: 21 each, Rfl: 0   Multiple Vitamin (MULTIVITAMIN) capsule, Take 1 capsule by mouth daily., Disp: , Rfl:    ondansetron (ZOFRAN) 8 MG tablet, Take 1 tablet (8 mg total) by mouth every 8  (eight) hours as needed for nausea or vomiting. Start on the third day after chemotherapy., Disp: 60 tablet, Rfl: 2   pantoprazole (PROTONIX) 20 MG tablet, Take 1 tablet by mouth daily as needed for heartburn., Disp: , Rfl:    Potassium 99 MG TABS, Take 1 tablet by mouth as needed. (Patient not taking: Reported on 01/03/2023), Disp: , Rfl:    potassium chloride (KLOR-CON M) 10 MEQ tablet, Take 1 tablet (10 mEq total) by mouth daily., Disp: 14 tablet, Rfl: 0   simvastatin (ZOCOR) 20 MG tablet, Take 20 mg by mouth at bedtime., Disp: , Rfl:    tiZANidine (ZANAFLEX) 2 MG tablet, TAKE 1 TABLET BY MOUTH NIGHTLY AS NEEDED FOR MUSCLE SPASMS., Disp: 90 tablet, Rfl: 1   traMADol-acetaminophen (ULTRACET) 37.5-325 MG tablet, Take 1 tablet by mouth every 8 (eight) hours as needed., Disp: , Rfl:  No current facility-administered medications for this visit.  Facility-Administered Medications Ordered in Other Visits:    heparin lock flush 100 unit/mL, 500 Units, Intravenous, Once, Finnegan, Tollie Pizza, MD   heparin lock flush 100 unit/mL, 500 Units, Intracatheter, PRN, Orlie Dakin, Tollie Pizza, MD   heparin lock flush 100 unit/mL, 500 Units, Intravenous, Once, Orlie Dakin, Tollie Pizza, MD   heparin lock flush 100 unit/mL, 500 Units, Intravenous, Once, Orlie Dakin, Tollie Pizza, MD   heparin lock flush 100 unit/mL, 500 Units, Intravenous, Once, Alinda Dooms, NP   sodium chloride flush (NS) 0.9 % injection 10 mL, 10 mL, Intravenous, PRN, Jeralyn Ruths, MD, 10 mL at 12/04/17 0901   sodium chloride flush (NS) 0.9 % injection 10 mL, 10 mL, Intracatheter, PRN, Orlie Dakin, Tollie Pizza, MD   sodium chloride flush (NS) 0.9 % injection 10 mL, 10 mL, Intravenous, PRN, Jeralyn Ruths, MD, 10 mL at 04/24/21 0839   sodium chloride flush (NS) 0.9 % injection 10 mL, 10 mL, Intracatheter, PRN, Jeralyn Ruths, MD, 10 mL at 04/12/23 1419   yttrium-90 injection 22.3 millicurie, 22.3 millicurie, Intravenous, Once, Carmina Miller,  MD  Physical exam:  Vitals:   04/16/23 0849  BP: (!) 110/55  Pulse: (!) 111  Resp: 18  Temp: 98.2 F (36.8 C)  TempSrc: Oral  SpO2: 98%  Weight: 82 lb (37.2 kg)  Height: 4\' 10"  (1.473 m)   Physical Exam Vitals reviewed.  Constitutional:      Appearance: She is not ill-appearing.     Comments: Thin built, frail appearing. In wheelchair. Accompanied.   Cardiovascular:     Rate and Rhythm: Normal rate. Rhythm irregular.  Pulmonary:  Effort: Pulmonary effort is normal. No respiratory distress.  Skin:    General: Skin is warm and dry.     Coloration: Skin is pale.  Neurological:     Mental Status: She is alert and oriented to person, place, and time.  Psychiatric:        Mood and Affect: Mood normal.        Behavior: Behavior normal.       Latest Ref Rng & Units 04/16/2023    8:18 AM  CMP  Glucose 70 - 99 mg/dL 811   BUN 8 - 23 mg/dL 16   Creatinine 9.14 - 1.00 mg/dL 7.82   Sodium 956 - 213 mmol/L 134   Potassium 3.5 - 5.1 mmol/L 3.3   Chloride 98 - 111 mmol/L 102   CO2 22 - 32 mmol/L 22   Calcium 8.9 - 10.3 mg/dL 8.9   Total Protein 6.5 - 8.1 g/dL 6.8   Total Bilirubin 0.3 - 1.2 mg/dL 0.7   Alkaline Phos 38 - 126 U/L 52   AST 15 - 41 U/L 23   ALT 0 - 44 U/L 12       Latest Ref Rng & Units 04/16/2023    8:18 AM  CBC  WBC 4.0 - 10.5 K/uL 5.8   Hemoglobin 12.0 - 15.0 g/dL 08.6   Hematocrit 57.8 - 46.0 % 38.0   Platelets 150 - 400 K/uL 236    No images are attached to the encounter.  ECHOCARDIOGRAM COMPLETE  Result Date: 04/08/2023    ECHOCARDIOGRAM REPORT   Patient Name:   RONALD MODGLIN [facility] Date of Exam: 04/08/2023 Medical Rec #:  [id number]         Height:       58.0 in Accession #:    [id number]        Weight:       83.0 lb Date of Birth:  [birth year]          BSA:          1.253 m Patient Age:    77 years          BP:           107/53 mmHg Patient Gender: F                 HR:           99 bpm. Exam Location:  [facility] Procedure: 2D Echo, Cardiac Doppler and Color  Doppler Indications:     Dyspnea  History:         Patient has no prior history of Echocardiogram examinations.                  Signs/Symptoms:Dyspnea; Risk Factors:Dyslipidemia. Pectus                  Excavatum.  Sonographer:     Mikki Harbor Referring Phys:  [id number] CARMEN L Jayme Cloud Diagnosing Phys: Yvonne Kendall MD  Sonographer Comments: Technically challenging study due to limited acoustic windows, no apical window and suboptimal parasternal window. Image acquisition challenging due to patient body habitus. IMPRESSIONS  1. Left ventricular ejection fraction, by estimation, is >55%. The left ventricle has normal function. Left ventricular endocardial border not optimally defined to evaluate regional wall motion. Left ventricular diastolic function could not be evaluated.  2. Right ventricular systolic function is normal. The right ventricular size is normal.  3. The mitral valve was not well visualized. No evidence of mitral valve regurgitation.  4.  Tricuspid valve regurgitation not well assessed.  5. The aortic valve was not well visualized. Aortic valve regurgitation not well assessed.  6. Pulmonic valve regurgitation not well assessed. FINDINGS  Left Ventricle: Left ventricular ejection fraction, by estimation, is >55%. The left ventricle has normal function. Left ventricular endocardial border not optimally defined to evaluate regional wall motion. The left ventricular internal cavity size was  normal in size. There is borderline left ventricular hypertrophy. Left ventricular diastolic function could not be evaluated. Right Ventricle: The right ventricular size is normal. No increase in right ventricular wall thickness. Right ventricular systolic function is normal. Left Atrium: Left atrial size was not well visualized. Right Atrium: Right atrial size was not well visualized. Pericardium: There is no evidence of pericardial effusion. Mitral Valve: The mitral valve was not well visualized. No evidence of  mitral valve regurgitation. Tricuspid Valve: The tricuspid valve is not well visualized. Tricuspid valve regurgitation not well assessed. Aortic Valve: The aortic valve was not well visualized. Aortic valve regurgitation not well assessed. Pulmonic Valve: The pulmonic valve was not well visualized. Pulmonic valve regurgitation not well assessed. Aorta: The aortic root is normal in size and structure. Pulmonary Artery: The pulmonary artery is not well seen. Venous: The inferior vena cava was not well visualized. IAS/Shunts: The interatrial septum was not well visualized.  LEFT VENTRICLE PLAX 2D LVIDd:         2.40 cm LVIDs:         1.30 cm LV PW:         0.90 cm LV IVS:        1.00 cm LVOT diam:     1.80 cm LVOT Area:     2.54 cm  LEFT ATRIUM         Index LA diam:    2.00 cm 1.60 cm/m   AORTA Ao Root diam: 2.80 cm  SHUNTS Systemic Diam: 1.80 cm Yvonne Kendall MD Electronically signed by Yvonne Kendall MD Signature Date/Time: 04/08/2023/12:51:55 PM    Final    NM PET Image Restag (PS) Skull Base To Thigh  Result Date: 03/21/2023 CLINICAL DATA:  Subsequent treatment strategy for rectal cancer. Recurrence on recent pelvic MRI. History of lymphoma. EXAM: NUCLEAR MEDICINE PET SKULL BASE TO THIGH TECHNIQUE: 5.73 mCi F-18 FDG was injected intravenously. Full-ring PET imaging was performed from the skull base to thigh after the radiotracer. CT data was obtained and used for attenuation correction and anatomic localization. Fasting blood glucose: 82 mg/dl COMPARISON:  MR pelvis 03/12/2023, chest CT 11/27/2022 and PET-CT 04/04/2022. FINDINGS: Mediastinal blood pool activity: SUV max 1.6 NECK: No hypermetabolic cervical lymph nodes are identified. No suspicious activity identified within the pharyngeal mucosal space. Incidental CT findings: none CHEST: There are no hypermetabolic mediastinal, hilar or axillary lymph nodes. There has been substantial interval enlargement of a previously demonstrated right lower lobe  infrahilar mass, now measuring approximately 6.0 x 4.6 cm on image 60/4. This has significantly enlarged compared with the most recent chest CT of 4 months ago. This mass demonstrates heterogeneous hypermetabolic activity with an SUV max of 15.1. In addition, there are several other enlarging pulmonary nodules bilaterally with hypermetabolic activity. For example, there is 1.1 cm nodule in the right lower lobe on image 63/4. In the left lower lobe, there is a 1.0 cm nodule on image 59/4 (SUV max 2.3). Incidental CT findings: Underlying centrilobular and paraseptal emphysema with chronic central airway thickening. Right IJ Port-A-Cath extends to the superior cavoatrial junction. There is atherosclerosis  of the aorta, great vessels and coronary arteries. ABDOMEN/PELVIS: There is no hypermetabolic activity within the liver, adrenal glands, spleen or pancreas. The recurrent rectal mass seen on recent pelvic MRI is intensely hypermetabolic (SUV max 17.3). This has enlarged and demonstrates increased metabolic activity compared with the prior PET-CT. As suggested on MRI, this may invade the posterior wall of the vagina. No hypermetabolic lymph nodes are identified in the abdomen or pelvis. There is a small right inguinal lymph node with low level metabolic activity, nonspecific. Incidental CT findings: Aortic and branch vessel atherosclerosis. No ascites or peritoneal nodularity. SKELETON: There is no hypermetabolic activity to suggest osseous metastatic disease. Incidental CT findings: Previous left total hip arthroplasty. IMPRESSION: 1. Enlarging and hypermetabolic rectal mass consistent with local recurrence of rectal cancer. This may invade the posterior wall of the vagina. 2. Significant enlargement of previously demonstrated right lower lobe lung mass which is hypermetabolic, consistent with malignancy. There are several other enlarging pulmonary nodules bilaterally with hypermetabolic activity, also consistent with  metastatic disease. Consider tissue sampling if not previously performed given the patient's history of lymphoma and the rapid progression over the last 4 months. Primary bronchogenic carcinoma would be a consideration as well. 3. No evidence of metastatic disease in the neck, abdomen or pelvis. 4. Aortic Atherosclerosis (ICD10-I70.0) and Emphysema (ICD10-J43.9). Electronically Signed   By: Carey Bullocks M.D.   On: 03/21/2023 15:09    Assessment and plan- Patient is a 77 y.o. female diagnosed with Recurrent stage IV rectal cancer- s/p cycle 1 of folfox plus avastin chemotherapy on 04/10/23 with Dr Orlie Dakin who presents to symptom management clinic for   Chemo follow up- moderately poor tolerance to cycle 1 but improving. IVF today over 2 hours. Will add fluids to her D3 treatment plan.  History of follicular lymphoma- on surveillance Weight loss- on megace. Encouraged continued use.  Diarrhea- d/t chemotherapy. Well controlled with imodium.  Irregular heart rhythm- possible history of a fib but was related to sepsis in 2019 and has not seen cardiology. Ref to cardiology today.  Shortness of breath- related to known malignancy and obstruction of lung. Recommend she see pulmonology if ongoing symptoms for optimization of comorbid conditions. No anemia currently. Question if anxiety contributes as well.  Hypokalemia- 3.3. Mild. Restart oral potassium 20 meq daily.   Follow up with Dr Orlie Dakin as scheduled. RTC sooner if symptoms do not improve or worsen.    Visit Diagnosis 1. Rectal cancer (HCC)   2. Convalescence following chemotherapy   3. Chemotherapy follow-up examination   4. Irregular heart rhythm     Patient expressed understanding and was in agreement with this plan. She also understands that She can call clinic at any time with any questions, concerns, or complaints.   Thank you for allowing me to participate in the care of this very pleasant patient.   Consuello Masse, DNP, AGNP-C,  AOCNP [facility] [phone]

## 2023-04-17 ENCOUNTER — Other Ambulatory Visit: Payer: Self-pay

## 2023-04-17 ENCOUNTER — Telehealth: Payer: Self-pay

## 2023-04-17 NOTE — Telephone Encounter (Addendum)
I have notified the patient's daughter.   She wanted me to let you know that Veronica Boyd started chemo last week and since then her pulse has been dropping. She has been referred to Dr. Mariah Milling. She has an appt with him on 05/01/2023.  Dr. Jayme Cloud- just an FYI.  Nothing further needed.

## 2023-04-17 NOTE — Telephone Encounter (Signed)
-----   Message from Salena Saner, MD sent at 04/17/2023 10:35 AM EDT ----- Her echocardiogram shows that her heart is actually working very hard.  She needs to stay well-hydrated.  The remainder of the echocardiogram was normal.

## 2023-04-17 NOTE — Telephone Encounter (Signed)
Noted  

## 2023-04-18 ENCOUNTER — Other Ambulatory Visit: Payer: Self-pay | Admitting: Oncology

## 2023-04-19 ENCOUNTER — Other Ambulatory Visit: Payer: Self-pay

## 2023-04-23 MED FILL — Dexamethasone Sodium Phosphate Inj 100 MG/10ML: INTRAMUSCULAR | Qty: 1 | Status: AC

## 2023-04-24 ENCOUNTER — Encounter: Payer: Self-pay | Admitting: Oncology

## 2023-04-24 ENCOUNTER — Inpatient Hospital Stay (HOSPITAL_BASED_OUTPATIENT_CLINIC_OR_DEPARTMENT_OTHER): Payer: 59 | Admitting: Oncology

## 2023-04-24 ENCOUNTER — Inpatient Hospital Stay: Payer: 59

## 2023-04-24 VITALS — BP 116/77 | HR 107 | Temp 97.5°F | Resp 16 | Ht <= 58 in | Wt 83.0 lb

## 2023-04-24 DIAGNOSIS — C20 Malignant neoplasm of rectum: Secondary | ICD-10-CM

## 2023-04-24 DIAGNOSIS — Z5111 Encounter for antineoplastic chemotherapy: Secondary | ICD-10-CM | POA: Diagnosis not present

## 2023-04-24 LAB — CBC WITH DIFFERENTIAL (CANCER CENTER ONLY)
Abs Immature Granulocytes: 0 10*3/uL (ref 0.00–0.07)
Basophils Absolute: 0 10*3/uL (ref 0.0–0.1)
Basophils Relative: 1 %
Eosinophils Absolute: 0.1 10*3/uL (ref 0.0–0.5)
Eosinophils Relative: 3 %
HCT: 39 % (ref 36.0–46.0)
Hemoglobin: 12.7 g/dL (ref 12.0–15.0)
Immature Granulocytes: 0 %
Lymphocytes Relative: 57 %
Lymphs Abs: 2.2 10*3/uL (ref 0.7–4.0)
MCH: 30 pg (ref 26.0–34.0)
MCHC: 32.6 g/dL (ref 30.0–36.0)
MCV: 92.2 fL (ref 80.0–100.0)
Monocytes Absolute: 0.8 10*3/uL (ref 0.1–1.0)
Monocytes Relative: 21 %
Neutro Abs: 0.7 10*3/uL — ABNORMAL LOW (ref 1.7–7.7)
Neutrophils Relative %: 18 %
Platelet Count: 205 10*3/uL (ref 150–400)
RBC: 4.23 MIL/uL (ref 3.87–5.11)
RDW: 14.7 % (ref 11.5–15.5)
WBC Count: 3.9 10*3/uL — ABNORMAL LOW (ref 4.0–10.5)
nRBC: 0 % (ref 0.0–0.2)

## 2023-04-24 LAB — CMP (CANCER CENTER ONLY)
ALT: 11 U/L (ref 0–44)
AST: 24 U/L (ref 15–41)
Albumin: 3.8 g/dL (ref 3.5–5.0)
Alkaline Phosphatase: 60 U/L (ref 38–126)
Anion gap: 11 (ref 5–15)
BUN: 13 mg/dL (ref 8–23)
CO2: 22 mmol/L (ref 22–32)
Calcium: 9 mg/dL (ref 8.9–10.3)
Chloride: 104 mmol/L (ref 98–111)
Creatinine: 0.68 mg/dL (ref 0.44–1.00)
GFR, Estimated: >60 mL/min (ref >60)
Glucose, Bld: 121 mg/dL — ABNORMAL HIGH (ref 70–99)
Potassium: 3.5 mmol/L (ref 3.5–5.1)
Sodium: 137 mmol/L (ref 135–145)
Total Bilirubin: 0.7 mg/dL (ref 0.3–1.2)
Total Protein: 7.3 g/dL (ref 6.5–8.1)

## 2023-04-24 MED ORDER — FAMOTIDINE IN NACL 20-0.9 MG/50ML-% IV SOLN
20.0000 mg | Freq: Once | INTRAVENOUS | Status: AC
  Administered 2023-04-24: 20 mg via INTRAVENOUS
  Filled 2023-04-24: qty 50

## 2023-04-24 MED ORDER — DEXTROSE 5 % IV SOLN
Freq: Once | INTRAVENOUS | Status: AC
  Filled 2023-04-24: qty 250

## 2023-04-24 MED ORDER — SODIUM CHLORIDE 0.9 % IV SOLN
10.0000 mg | Freq: Once | INTRAVENOUS | Status: AC
  Administered 2023-04-24: 10 mg via INTRAVENOUS
  Filled 2023-04-24: qty 10

## 2023-04-24 MED ORDER — OXALIPLATIN CHEMO INJECTION 100 MG/20ML
85.0000 mg/m2 | Freq: Once | INTRAVENOUS | Status: AC
  Administered 2023-04-24: 100 mg via INTRAVENOUS
  Filled 2023-04-24: qty 6.17

## 2023-04-24 MED ORDER — LEUCOVORIN CALCIUM INJECTION 350 MG
500.0000 mg | Freq: Once | INTRAVENOUS | Status: AC
  Administered 2023-04-24: 500 mg via INTRAVENOUS
  Filled 2023-04-24: qty 25

## 2023-04-24 MED ORDER — SODIUM CHLORIDE 0.9 % IV SOLN
5.0000 mg/kg | Freq: Once | INTRAVENOUS | Status: AC
  Administered 2023-04-24: 200 mg via INTRAVENOUS
  Filled 2023-04-24: qty 8

## 2023-04-24 MED ORDER — DIPHENHYDRAMINE HCL 50 MG/ML IJ SOLN
25.0000 mg | Freq: Once | INTRAMUSCULAR | Status: AC
  Administered 2023-04-24: 25 mg via INTRAVENOUS
  Filled 2023-04-24: qty 1

## 2023-04-24 MED ORDER — SODIUM CHLORIDE 0.9 % IV SOLN
Freq: Once | INTRAVENOUS | Status: AC
  Filled 2023-04-24: qty 250

## 2023-04-24 MED ORDER — FLUOROURACIL CHEMO INJECTION 500 MG/10ML
400.0000 mg/m2 | Freq: Once | INTRAVENOUS | Status: AC
  Administered 2023-04-24: 500 mg via INTRAVENOUS
  Filled 2023-04-24: qty 10

## 2023-04-24 MED ORDER — SODIUM CHLORIDE 0.9% FLUSH
10.0000 mL | Freq: Once | INTRAVENOUS | Status: AC
  Administered 2023-04-24: 10 mL via INTRAVENOUS
  Filled 2023-04-24: qty 10

## 2023-04-24 MED ORDER — PALONOSETRON HCL INJECTION 0.25 MG/5ML
0.2500 mg | Freq: Once | INTRAVENOUS | Status: AC
  Administered 2023-04-24: 0.25 mg via INTRAVENOUS
  Filled 2023-04-24: qty 5

## 2023-04-24 MED ORDER — SODIUM CHLORIDE 0.9 % IV SOLN
2400.0000 mg/m2 | INTRAVENOUS | Status: DC
  Administered 2023-04-24: 3000 mg via INTRAVENOUS
  Filled 2023-04-24: qty 60

## 2023-04-24 MED ORDER — HEPARIN SOD (PORK) LOCK FLUSH 100 UNIT/ML IV SOLN
500.0000 [IU] | Freq: Once | INTRAVENOUS | Status: DC
  Filled 2023-04-24: qty 5

## 2023-04-24 NOTE — Progress Notes (Signed)
Are we working out, or should I just plan to swim? [facility]  [phone]) [phone] Fax:([phone]) [phone]  ID: Veronica Boyd OB: [birth year]  MR#: [id number]  AOZ#:[id number]  Patient Care Team: Barbette Reichmann, MD as PCP - General (Internal Medicine) Jeralyn Ruths, MD as Consulting Physician (Oncology) Benita Gutter, RN as Oncology Nurse Navigator Salena Saner, MD as Consulting Physician (Pulmonary Disease)  CHIEF COMPLAINT: Recurrent stage IV rectal cancer, history of follicular lymphoma.  INTERVAL HISTORY: Patient returns to clinic today for further evaluation and consideration of cycle 2 of FOLFOX plus Avastin.  She feels improved from last week where she was seen in symptom management clinic requiring IV fluids. Her appetite has improved since initiating Megace.  She reports her pain is still well-controlled with tramadol.  She has a chronic peripheral neuropathy, but no other neurologic complaints.  She denies any recent fevers or illnesses.  She denies any chest pain, shortness of breath, or hemoptysis.  She has a chronic cough.  She has pelvic pain with bowel movements.  She denies any nausea, vomiting, constipation or diarrhea.  She has no urinary complaints.  Patient offers no further specific complaints today.  REVIEW OF SYSTEMS:   Review of Systems  Constitutional:  Positive for malaise/fatigue. Negative for fever and weight loss.  Respiratory: Negative.  Negative for cough, hemoptysis and shortness of breath.   Cardiovascular: Negative.  Negative for chest pain and leg swelling.  Gastrointestinal:  Negative for abdominal pain, blood in stool, diarrhea, melena, nausea and vomiting.  Genitourinary: Negative.  Negative for dysuria.  Musculoskeletal: Negative.  Negative for back pain, joint pain and neck pain.  Skin: Negative.  Negative for rash.  Neurological:  Positive for tingling, sensory change and weakness. Negative for focal weakness and  headaches.  Psychiatric/Behavioral: Negative.  The patient is not nervous/anxious and does not have insomnia.     As per HPI. Otherwise, a complete review of systems is negative.   PAST MEDICAL HISTORY: Past Medical History:  Diagnosis Date   Anemia due to antineoplastic chemotherapy    Arthritis    Bronchial obstruction    Cataract    bilateral   Chemotherapy-induced neuropathy (HCC)    Chicken pox    Colon polyp    Dyspnea    Follicular lymphoma (HCC) 08/2016   lymph nodes    GERD (gastroesophageal reflux disease)    Hyperlipidemia    Lung nodule    Lung nodules 09/2022   right upper lobe   Mass of right chest wall    Osteoporosis    Protein-calorie malnutrition, severe (HCC)    Rectal carcinoma (HCC)    Thrombocytopenia (HCC)     PAST SURGICAL HISTORY: Past Surgical History:  Procedure Laterality Date   AXILLARY LYMPH NODE DISSECTION Right 08/21/2016   Procedure: AXILLARY LYMPH NODE excision;  Surgeon: Nadeen Landau, MD;  Location: [facility] ORS;  Service: General;  Laterality: Right;   CATARACT EXTRACTION, BILATERAL Bilateral    COLONOSCOPY N/A 09/19/2020   Procedure: COLONOSCOPY;  Surgeon: Regis Bill, MD;  Location: [facility] ENDOSCOPY;  Service: Endoscopy;  Laterality: N/A;   PORTA CATH INSERTION N/A 09/16/2017   Procedure: PORTA CATH INSERTION;  Surgeon: Annice Needy, MD;  Location: [facility] INVASIVE CV LAB;  Service: Cardiovascular;  Laterality: N/A;   TONSILLECTOMY     TOTAL HIP ARTHROPLASTY Left 1992    FAMILY HISTORY: Family History  Problem Relation Age of Onset   Diabetes Sister    Lung  cancer Brother    Diabetes Brother    Basal cell carcinoma Daughter    Breast cancer Paternal Aunt     ADVANCED DIRECTIVES (Y/N):  N  HEALTH MAINTENANCE: Social History   Tobacco Use   Smoking status: Never   Smokeless tobacco: Never  Vaping Use   Vaping Use: Never used  Substance Use Topics   Alcohol use: No   Drug use: No      Colonoscopy:  PAP:  Bone density:  Lipid panel:  No Known Allergies  Current Outpatient Medications  Medication Sig Dispense Refill   diclofenac sodium (VOLTAREN) 1 % GEL Apply 2 g topically 2 (two) times daily as needed.     diphenoxylate-atropine (LOMOTIL) 2.5-0.025 MG tablet TAKE 2 TABLETS BY MOUTH 4 (FOUR) TIMES DAILY AS NEEDED FOR DIARRHEA OR LOOSE STOOLS. 60 tablet 1   gabapentin (NEURONTIN) 300 MG capsule TAKE 1 CAPSULE BY MOUTH TWICE A DAY 60 capsule 2   lidocaine-prilocaine (EMLA) cream Apply 1 Application topically as needed. Apply to port 1 hour prior to use as needed 30 g 2   megestrol (MEGACE) 40 MG tablet Take 1 tablet (40 mg total) by mouth daily. 90 tablet 1   ondansetron (ZOFRAN) 8 MG tablet Take 1 tablet (8 mg total) by mouth every 8 (eight) hours as needed for nausea or vomiting. Start on the third day after chemotherapy. 60 tablet 2   pantoprazole (PROTONIX) 20 MG tablet Take 1 tablet by mouth daily as needed for heartburn.     simvastatin (ZOCOR) 20 MG tablet Take 20 mg by mouth at bedtime.     tiZANidine (ZANAFLEX) 2 MG tablet TAKE 1 TABLET BY MOUTH NIGHTLY AS NEEDED FOR MUSCLE SPASMS. 90 tablet 1   traMADol-acetaminophen (ULTRACET) 37.5-325 MG tablet Take 1 tablet by mouth every 8 (eight) hours as needed.     cyanocobalamin (VITAMIN B12) 1000 MCG tablet Take 1,000 mcg by mouth daily. (Patient not taking: Reported on 04/16/2023)     Multiple Vitamin (MULTIVITAMIN) capsule Take 1 capsule by mouth daily. (Patient not taking: Reported on 04/16/2023)     Potassium 99 MG TABS Take 1 tablet by mouth as needed. (Patient not taking: Reported on 01/03/2023)     No current facility-administered medications for this visit.   Facility-Administered Medications Ordered in Other Visits  Medication Dose Route Frequency Provider Last Rate Last Admin   fluorouracil (ADRUCIL) 3,000 mg in sodium chloride 0.9 % 90 mL chemo infusion  2,400 mg/m2 (Treatment Plan Recorded) Intravenous 1  day or 1 dose Jeralyn Ruths, MD   Infusion Verify at 04/24/23 1334   heparin lock flush 100 unit/mL  500 Units Intravenous Once Jeralyn Ruths, MD       heparin lock flush 100 unit/mL  500 Units Intracatheter PRN Jeralyn Ruths, MD       heparin lock flush 100 unit/mL  500 Units Intravenous Once Jeralyn Ruths, MD       heparin lock flush 100 unit/mL  500 Units Intravenous Once Jeralyn Ruths, MD       heparin lock flush 100 unit/mL  500 Units Intravenous Once Jeralyn Ruths, MD       sodium chloride flush (NS) 0.9 % injection 10 mL  10 mL Intravenous PRN Jeralyn Ruths, MD   10 mL at 12/04/17 0901   sodium chloride flush (NS) 0.9 % injection 10 mL  10 mL Intracatheter PRN Jeralyn Ruths, MD       sodium  chloride flush (NS) 0.9 % injection 10 mL  10 mL Intravenous PRN Jeralyn Ruths, MD   10 mL at 04/24/21 0839   sodium chloride flush (NS) 0.9 % injection 10 mL  10 mL Intracatheter PRN Jeralyn Ruths, MD   10 mL at 04/12/23 1419   yttrium-90 injection 22.3 millicurie  22.3 millicurie Intravenous Once Carmina Miller, MD        OBJECTIVE: Vitals:   04/24/23 0832  BP: 116/77  Pulse: (!) 107  Resp: 16  Temp: (!) 97.5 F (36.4 C)  SpO2: 98%       Body mass index is 17.35 kg/m.    ECOG FS:0 - Asymptomatic  General: Thin, no acute distress. Eyes: Pink conjunctiva, anicteric sclera. HEENT: Normocephalic, moist mucous membranes. Lungs: No audible wheezing or coughing. Heart: Regular rate and rhythm. Abdomen: Soft, nontender, no obvious distention. Musculoskeletal: No edema, cyanosis, or clubbing. Neuro: Alert, answering all questions appropriately. Cranial nerves grossly intact. Skin: No rashes or petechiae noted. Psych: Normal affect.  LAB RESULTS:  Lab Results  Component Value Date   NA 137 04/24/2023   K 3.5 04/24/2023   CL 104 04/24/2023   CO2 22 04/24/2023   GLUCOSE 121 (H) 04/24/2023   BUN 13 04/24/2023   CREATININE 0.68  04/24/2023   CALCIUM 9.0 04/24/2023   PROT 7.3 04/24/2023   ALBUMIN 3.8 04/24/2023   AST 24 04/24/2023   ALT 11 04/24/2023   ALKPHOS 60 04/24/2023   BILITOT 0.7 04/24/2023   GFRNONAA >60 04/24/2023   GFRAA >60 06/28/2020    Lab Results  Component Value Date   WBC 3.9 (L) 04/24/2023   NEUTROABS 0.7 (L) 04/24/2023   HGB 12.7 04/24/2023   HCT 39.0 04/24/2023   MCV 92.2 04/24/2023   PLT 205 04/24/2023     STUDIES: ECHOCARDIOGRAM COMPLETE  Result Date: 04/08/2023    ECHOCARDIOGRAM REPORT   Patient Name:   PRICILLA BOROWY [facility] Date of Exam: 04/08/2023 Medical Rec #:  [id number]         Height:       58.0 in Accession #:    [id number]        Weight:       83.0 lb Date of Birth:  08/17/1946          BSA:          1.253 m Patient Age:    77 years          BP:           107/53 mmHg Patient Gender: F                 HR:           99 bpm. Exam Location:  [facility] Procedure: 2D Echo, Cardiac Doppler and Color Doppler Indications:     Dyspnea  History:         Patient has no prior history of Echocardiogram examinations.                  Signs/Symptoms:Dyspnea; Risk Factors:Dyslipidemia. Pectus                  Excavatum.  Sonographer:     Mikki Harbor Referring Phys:  [id number] CARMEN L Jayme Cloud Diagnosing Phys: Yvonne Kendall MD  Sonographer Comments: Technically challenging study due to limited acoustic windows, no apical window and suboptimal parasternal window. Image acquisition challenging due to patient body habitus. IMPRESSIONS  1. Left ventricular ejection fraction, by estimation, is >55%.  The left ventricle has normal function. Left ventricular endocardial border not optimally defined to evaluate regional wall motion. Left ventricular diastolic function could not be evaluated.  2. Right ventricular systolic function is normal. The right ventricular size is normal.  3. The mitral valve was not well visualized. No evidence of mitral valve regurgitation.  4. Tricuspid valve regurgitation not well assessed.  5.  The aortic valve was not well visualized. Aortic valve regurgitation not well assessed.  6. Pulmonic valve regurgitation not well assessed. FINDINGS  Left Ventricle: Left ventricular ejection fraction, by estimation, is >55%. The left ventricle has normal function. Left ventricular endocardial border not optimally defined to evaluate regional wall motion. The left ventricular internal cavity size was  normal in size. There is borderline left ventricular hypertrophy. Left ventricular diastolic function could not be evaluated. Right Ventricle: The right ventricular size is normal. No increase in right ventricular wall thickness. Right ventricular systolic function is normal. Left Atrium: Left atrial size was not well visualized. Right Atrium: Right atrial size was not well visualized. Pericardium: There is no evidence of pericardial effusion. Mitral Valve: The mitral valve was not well visualized. No evidence of mitral valve regurgitation. Tricuspid Valve: The tricuspid valve is not well visualized. Tricuspid valve regurgitation not well assessed. Aortic Valve: The aortic valve was not well visualized. Aortic valve regurgitation not well assessed. Pulmonic Valve: The pulmonic valve was not well visualized. Pulmonic valve regurgitation not well assessed. Aorta: The aortic root is normal in size and structure. Pulmonary Artery: The pulmonary artery is not well seen. Venous: The inferior vena cava was not well visualized. IAS/Shunts: The interatrial septum was not well visualized.  LEFT VENTRICLE PLAX 2D LVIDd:         2.40 cm LVIDs:         1.30 cm LV PW:         0.90 cm LV IVS:        1.00 cm LVOT diam:     1.80 cm LVOT Area:     2.54 cm  LEFT ATRIUM         Index LA diam:    2.00 cm 1.60 cm/m   AORTA Ao Root diam: 2.80 cm  SHUNTS Systemic Diam: 1.80 cm Yvonne Kendall MD Electronically signed by Yvonne Kendall MD Signature Date/Time: 04/08/2023/12:51:55 PM    Final     ONCOLOGY HISTORY:  Biopsy confirmed rectal  cancer.  MRI completed at [facility] on October 11, 2020 revealed extension of the tumor beyond the wall into the rectovaginal recess with suspected invasion of the vaginal wall posteriorly.  Tumor also appears to involve the internal anal sphincter.  There are also numerous prominent presacral and mesorectal lymph nodes highly suspicious for malignancy.  PET scan results from November 17, 2020 reviewed independently with 3 hypermetabolic right lower lobe lung nodules consistent with pulmonary metastasis.  Patient completed cycle 8 of FOLFOX on Mar 29, 2021.  She then completed cycle 5 of her 5-FU pump on May 26, 2021 and XRT on June 01, 2021.  PET scan results from July 17, 2021 reviewed independently with no evidence of disease other than enlarging right lower lobe lung lesion.  Patient completed SBRT to the right lung lesion on August 22, 2021.  Repeat PET scan on July 25, 2021 revealed minimal residual disease in the rectum and likely resolution of lesions in the lung.    ASSESSMENT: Recurrent stage IV rectal cancer, history of follicular lymphoma.  PLAN:  Recurrent stage IV rectal cancer: See oncology history as above.  PET scan results from Mar 21, 2023 reviewed independently and reported as above with clear progression of disease in patient's pelvis as well as multiple pulmonary nodules.  Her CEA also continues to significantly increase and is now 29.8.  We briefly mentioned hospice and end-of-life, but patient wishes to pursue treatment.  Proceed with cycle 2 of FOLFOX plus Avastin today.  Return to clinic in 2 days for pump removal and IV fluids.  Patient will then return to clinic again on Monday for additional IV fluids.  Return to clinic in 2 weeks for further evaluation and consideration of cycle 3.   Recurrent follicular lymphoma: CT scan results from December 23, 2019 reviewed independently with no obvious evidence of recurrent or progressive disease.  PET scan results as  above consistent with rectal cancer metastasis and no evidence of lymphoma.   Patient received Zevalin on July 30, 2017 with not much therapeutic effect.  She subsequently underwent cycles 5 of R-CHOP chemotherapy with Neulasta support completing on December 12, 2017.  Patient continues to be in complete remission.  Neuropathy: Chronic and unchanged. Pelvic pain: MRI as above.  Continue tramadol as prescribed.   Poor appetite: Improved.  Continue Megace. Hypokalemia: Resolved. Neutropenia: Proceed with treatment as above.  Will add Udenyca to pump removal.  Patient expressed understanding and was in agreement with this plan. She also understands that She can call clinic at any time with any questions, concerns, or complaints.    Cancer Staging  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 [facility]) Staging form: Lymphoid Neoplasms, AJCC 6th Edition - Clinical stage from 08/27/2016: Stage IIE - Signed by Jeralyn Ruths, MD on 08/27/2016  Rectal cancer [facility]) Staging form: Colon and Rectum, AJCC 8th Edition - Clinical: Stage IVA Dewaine Oats, [place]) - Signed by Jeralyn Ruths, MD on 11/02/2020   Jeralyn Ruths, MD   04/24/2023 4:16 PM

## 2023-04-24 NOTE — Progress Notes (Signed)
Handicap placard. Ensure plus vanilla

## 2023-04-24 NOTE — Patient Instructions (Signed)
[facility]  Discharge Instructions: Thank you for choosing [facility] to provide your oncology and hematology care.  If you have a lab appointment with the Cancer Center, please go directly to the Cancer Center and check in at the registration area.  Wear comfortable clothing and clothing appropriate for easy access to any Portacath or PICC line.   We strive to give you quality time with your provider. You may need to reschedule your appointment if you arrive late (15 or more minutes).  Arriving late affects you and other patients whose appointments are after yours.  Also, if you miss three or more appointments without notifying the office, you may be dismissed from the clinic at the provider's discretion.      For prescription refill requests, have your pharmacy contact our office and allow 72 hours for refills to be completed.     To help prevent nausea and vomiting after your treatment, we encourage you to take your nausea medication as directed.  BELOW ARE SYMPTOMS THAT SHOULD BE REPORTED IMMEDIATELY: *FEVER GREATER THAN 100.4 F (38 C) OR HIGHER *CHILLS OR SWEATING *NAUSEA AND VOMITING THAT IS NOT CONTROLLED WITH YOUR NAUSEA MEDICATION *UNUSUAL SHORTNESS OF BREATH *UNUSUAL BRUISING OR BLEEDING *URINARY PROBLEMS (pain or burning when urinating, or frequent urination) *BOWEL PROBLEMS (unusual diarrhea, constipation, pain near the anus) TENDERNESS IN MOUTH AND THROAT WITH OR WITHOUT PRESENCE OF ULCERS (sore throat, sores in mouth, or a toothache) UNUSUAL RASH, SWELLING OR PAIN  UNUSUAL VAGINAL DISCHARGE OR ITCHING   Items with * indicate a potential emergency and should be followed up as soon as possible or go to the Emergency Department if any problems should occur.  Please show the CHEMOTHERAPY ALERT CARD or IMMUNOTHERAPY ALERT CARD at check-in to the Emergency Department and triage nurse.  Should you have questions after your visit  or need to cancel or reschedule your appointment, please contact [facility]  [phone] and follow the prompts.  Office hours are 8:00 a.m. to 4:30 p.m. Monday - Friday. Please note that voicemails left after 4:00 p.m. may not be returned until the following business day.  We are closed weekends and major holidays. You have access to a nurse at all times for urgent questions. Please call the main number to the clinic [phone] and follow the prompts.  For any non-urgent questions, you may also contact your provider using MyChart. We now offer e-Visits for anyone 18 and older to request care online for non-urgent symptoms. For details visit [email or web address].   Also download the MyChart app! Go to the app store, search "MyChart", open the app, select [facility], and log in with your MyChart username and password.    

## 2023-04-26 ENCOUNTER — Inpatient Hospital Stay: Payer: 59

## 2023-04-26 ENCOUNTER — Other Ambulatory Visit: Payer: Self-pay

## 2023-04-26 VITALS — BP 133/82 | HR 92 | Temp 96.6°F | Resp 20

## 2023-04-26 DIAGNOSIS — C20 Malignant neoplasm of rectum: Secondary | ICD-10-CM

## 2023-04-26 DIAGNOSIS — Z5111 Encounter for antineoplastic chemotherapy: Secondary | ICD-10-CM | POA: Diagnosis not present

## 2023-04-26 MED ORDER — HEPARIN SOD (PORK) LOCK FLUSH 100 UNIT/ML IV SOLN
500.0000 [IU] | Freq: Once | INTRAVENOUS | Status: AC | PRN
  Administered 2023-04-26: 500 [IU]
  Filled 2023-04-26: qty 5

## 2023-04-26 MED ORDER — SODIUM CHLORIDE 0.9 % IV SOLN
Freq: Once | INTRAVENOUS | Status: AC
  Filled 2023-04-26: qty 250

## 2023-04-26 MED ORDER — PEGFILGRASTIM-CBQV 6 MG/0.6ML ~~LOC~~ SOSY
6.0000 mg | PREFILLED_SYRINGE | Freq: Once | SUBCUTANEOUS | Status: AC
  Administered 2023-04-26: 6 mg via SUBCUTANEOUS
  Filled 2023-04-26: qty 0.6

## 2023-04-26 MED ORDER — SODIUM CHLORIDE 0.9% FLUSH
10.0000 mL | INTRAVENOUS | Status: DC | PRN
  Administered 2023-04-26: 10 mL
  Filled 2023-04-26: qty 10

## 2023-04-29 ENCOUNTER — Inpatient Hospital Stay: Payer: 59

## 2023-04-29 VITALS — BP 94/58 | HR 112 | Temp 97.6°F | Resp 20

## 2023-04-29 DIAGNOSIS — Z5111 Encounter for antineoplastic chemotherapy: Secondary | ICD-10-CM | POA: Diagnosis not present

## 2023-04-29 DIAGNOSIS — C20 Malignant neoplasm of rectum: Secondary | ICD-10-CM

## 2023-04-29 MED ORDER — SODIUM CHLORIDE 0.9 % IV SOLN
Freq: Once | INTRAVENOUS | Status: AC
  Filled 2023-04-29: qty 250

## 2023-04-29 MED ORDER — SODIUM CHLORIDE 0.9% FLUSH
10.0000 mL | Freq: Once | INTRAVENOUS | Status: AC | PRN
  Administered 2023-04-29: 10 mL
  Filled 2023-04-29: qty 10

## 2023-04-29 MED ORDER — HEPARIN SOD (PORK) LOCK FLUSH 100 UNIT/ML IV SOLN
500.0000 [IU] | Freq: Once | INTRAVENOUS | Status: AC | PRN
  Administered 2023-04-29: 500 [IU]
  Filled 2023-04-29: qty 5

## 2023-04-29 NOTE — Progress Notes (Unsigned)
Cardiology Office Note  Date:  05/01/2023   ID:  TISA WEISEL, DOB 1946/07/01, MRN [id number]  PCP:  Barbette Reichmann, MD   Chief Complaint  Patient presents with   New Patient (Initial Visit)    Ref by Consuello Masse, NP for irregular heart rhythm. c/o shortness of breath and irregular heart beats. Medications reviewed by the patient verbally.     HPI:  [name] is a 77 year old woman with past medical history of Rectal cancer Follicular lymphoma Atrial fibrillation December 21, 2017, back to normal sinus rhythm by February 21 Who presents by referral from Consuello Masse for irregular heart rhythm  She presents today with her daughter Recurrent stage IV rectal cancer- s/p cycle 1 of folfox plus avastin chemotherapy on 04/10/23   history of a fib but was related to sepsis in 2019  weakness and diarrhea after her first cycle of chemotherapy on 04/10/23 in which she received folfox + avastin. Her pump was removed on 04/12/23. Per patient, that night she began feeling increasingly weak and generally poorly.   Initial diagnosis 2014. S/p radiation 3 years ago.  Noted increase lymphadenopathy and biopsy was recommended.  Unfortunately she refused and was lost to follow-up. Return to clinic in September 2017 with increased swelling surrounding her right collarbone.  Had biopsy that was suggestive of follicular lymphoma but not definitive.  Had PET scan to complete the staging work-up.  PET scan showed hypermetabolic adenopathy in bilateral subpectoral and axillary regions, right internal mammary chain and right paratracheal and subclavicular regions.  There is no evidence of hypermetabolic lymphadenopathy within the abdomen or pelvis.   Received Zevalin on July 30, 2017 with not much therapeutic effect.  She subsequently underwent cycles 5 of R CHOP chemotherapy with Neulasta support completing on December 12, 2017.   Echocardiogram April 08, 2023 Normal LV function, normal RV size  and function No significant valvular heart disease  Second chemo last wed, pump 48 hrs Needs fluids by IV twice a week Next chemo treatment on July 3rd White blood cell count running lower  She reports significant shortness of breath on exertion  Review of records indicates variable heart rate, 50-60 range sometimes over 100  EKG personally reviewed by myself on todays visit .EKG Interpretation  Date/Time:  Wednesday May 01 2023 10:38:06 EDT Ventricular Rate:  123 PR Interval:  142 QRS Duration: 64 QT Interval:  330 QTC Calculation: 472 R Axis:   7 Text Interpretation: Sinus tachycardia Right atrial enlargement When compared with ECG of 19-Sep-2022 11:02, No significant change was found Confirmed by Julien Nordmann [id number]) on 05/01/2023 11:08:26 AM     PMH:   has a past medical history of Anemia due to antineoplastic chemotherapy, Arthritis, Bronchial obstruction, Cataract, Chemotherapy-induced neuropathy (HCC), Chicken pox, Colon polyp, Dyspnea, 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  PSH:    Past Surgical History:  Procedure Laterality Date   AXILLARY LYMPH NODE DISSECTION Right 08/21/2016   Procedure: AXILLARY LYMPH NODE excision;  Surgeon: Nadeen Landau, MD;  Location: [facility] ORS;  Service: General;  Laterality: Right;   CATARACT EXTRACTION, BILATERAL Bilateral    COLONOSCOPY N/A 09/19/2020   Procedure: COLONOSCOPY;  Surgeon: Regis Bill, MD;  Location: [facility] ENDOSCOPY;  Service: Endoscopy;  Laterality: N/A;   PORTA CATH INSERTION N/A 09/16/2017   Procedure: PORTA CATH INSERTION;  Surgeon: Wyn Quaker,  Marlow Baars, MD;  Location: [facility] INVASIVE CV LAB;  Service: Cardiovascular;  Laterality: N/A;   TONSILLECTOMY     TOTAL HIP ARTHROPLASTY Left 1992     Current Outpatient Medications  Medication Sig Dispense Refill   diclofenac sodium (VOLTAREN) 1 % GEL Apply 2 g topically 2 (two) times daily as needed.     diphenoxylate-atropine (LOMOTIL) 2.5-0.025 MG tablet TAKE 2 TABLETS BY MOUTH 4 (FOUR) TIMES DAILY AS NEEDED FOR DIARRHEA OR LOOSE STOOLS. 60 tablet 1   gabapentin (NEURONTIN) 300 MG capsule TAKE 1 CAPSULE BY MOUTH TWICE A DAY 60 capsule 2   lidocaine-prilocaine (EMLA) cream Apply 1 Application topically as needed. Apply to port 1 hour prior to use as needed 30 g 2   megestrol (MEGACE) 40 MG tablet Take 1 tablet (40 mg total) by mouth daily. 90 tablet 1   ondansetron (ZOFRAN) 8 MG tablet Take 1 tablet (8 mg total) by mouth every 8 (eight) hours as needed for nausea or vomiting. Start on the third day after chemotherapy. 60 tablet 2   pantoprazole (PROTONIX) 20 MG tablet Take 1 tablet by mouth daily as needed for heartburn.     Potassium 99 MG TABS Take 1 tablet by mouth as needed.     simvastatin (ZOCOR) 20 MG tablet Take 20 mg by mouth at bedtime.     tiZANidine (ZANAFLEX) 2 MG tablet TAKE 1 TABLET BY MOUTH NIGHTLY AS NEEDED FOR MUSCLE SPASMS. 90 tablet 1   traMADol-acetaminophen (ULTRACET) 37.5-325 MG tablet Take 1 tablet by mouth every 8 (eight) hours as needed.     No current facility-administered medications for this visit.   Facility-Administered Medications Ordered in Other Visits  Medication Dose Route Frequency Provider Last Rate Last Admin   heparin lock flush 100 unit/mL  500 Units Intravenous Once Orlie Dakin, Tollie Pizza, MD       heparin lock flush 100 unit/mL  500 Units Intracatheter PRN Orlie Dakin, Tollie Pizza, MD       heparin lock flush 100 unit/mL  500 Units Intravenous Once Jeralyn Ruths, MD       heparin lock flush 100 unit/mL  500 Units Intravenous Once Jeralyn Ruths, MD       sodium chloride flush (NS) 0.9 % injection 10 mL  10 mL Intravenous PRN Jeralyn Ruths, MD   10 mL at 12/04/17 0901   sodium  chloride flush (NS) 0.9 % injection 10 mL  10 mL Intracatheter PRN Jeralyn Ruths, MD       sodium chloride flush (NS) 0.9 % injection 10 mL  10 mL Intravenous PRN Jeralyn Ruths, MD   10 mL at 04/24/21 0839   sodium chloride flush (NS) 0.9 % injection 10 mL  10 mL Intracatheter PRN Jeralyn Ruths, MD   10 mL at 04/12/23 1419   yttrium-90 injection 22.3 millicurie  22.3 millicurie Intravenous Once Carmina Miller, MD         Allergies:   Patient has no known allergies.   Social History:  The patient  reports that she has never smoked. She has never used smokeless tobacco. She reports that she does not drink alcohol and does not use drugs.   Family History:   family history includes Basal cell carcinoma in her daughter; Breast cancer in her paternal aunt; Diabetes in her brother and sister; Emphysema in her father; Heart failure in her mother; Lung cancer in her brother.    Review of Systems: Review of Systems  Constitutional:  Negative.   HENT: Negative.    Respiratory:  Positive for shortness of breath.   Cardiovascular: Negative.   Gastrointestinal: Negative.   Musculoskeletal: Negative.   Neurological: Negative.   Psychiatric/Behavioral: Negative.    All other systems reviewed and are negative.    PHYSICAL EXAM: VS:  BP 98/64 (BP Location: Right Arm, Patient Position: Sitting, Cuff Size: Normal)   Pulse (!) 123   Ht 4\' 10"  (1.473 m)   Wt 81 lb 2 oz (36.8 kg)   SpO2 97%   BMI 16.96 kg/m  , BMI Body mass index is 16.96 kg/m. GEN: Well nourished, well developed, in no acute distress HEENT: normal Neck: no JVD, carotid bruits, or masses Cardiac: RRR; no murmurs, rubs, or gallops,no edema  Respiratory:  clear to auscultation bilaterally, normal work of breathing GI: soft, nontender, nondistended, + BS MS: no deformity or atrophy Skin: warm and dry, no rash Neuro:  Strength and sensation are intact Psych: euthymic mood, full affect   Recent Labs: 11/13/2022:  B Natriuretic Peptide 67.2 04/16/2023: Magnesium 1.9 04/24/2023: ALT 11; BUN 13; Creatinine 0.68; Hemoglobin 12.7; Platelet Count 205; Potassium 3.5; Sodium 137    Lipid Panel No results found for: "CHOL", "HDL", "LDLCALC", "TRIG"    Wt Readings from Last 3 Encounters:  05/01/23 81 lb 2 oz (36.8 kg)  04/24/23 83 lb (37.6 kg)  04/16/23 82 lb (37.2 kg)       ASSESSMENT AND PLAN:  Problem List Items Addressed This Visit     Rectal cancer (HCC)   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 [facility])   Other Visit Diagnoses     Paroxysmal atrial fibrillation (HCC)    -  Primary   Relevant Orders   EKG 12-Lead (Completed)      Atrial fibrillation, paroxysmal Episode back in 2019, unclear history of A-fib since that time Currently with sinus tachycardia, PACs Heart rate labile sometimes 50-60, other times over 100 sinus tachycardia presumably Blood pressure low limiting use of beta-blockers for rate and rhythm control Recommended Zio monitor to determine rate variability Unable to exclude paroxysmal atrial tachycardia If needed could add midodrine for blood pressure support and add beta-blocker/metoprolol to heart rate 12.5 up to 25 twice daily as tolerated Echocardiogram with normal LV function  Shortness of breath Etiology unclear, possibly secondary to arrhythmia, tachycardia Reports shortness of breath today, she is what appears to be sinus tachycardia though rate elevated 120 bpm likely driven by underlying chemo, weight loss Unable to exclude atrial tachycardia Denies prior smoking history, hemoglobin stable, normal echo -Will hold off on ischemic workup given general debility  Rectal cancer, lymphoma Lymphoma treatment dating back to 2019, currently undergoing rectal cancer chemo, regiment discussed with her in detail Scheduled to receive cycle 3, difficult time with weight loss Recommend she stay hydrated   Total encounter time more than 60  minutes  Greater than 50% was spent in counseling and coordination of care with the patient    Signed, Dossie Arbour, M.D., Ph.D. [facility] [place], [place] [phone]

## 2023-05-01 ENCOUNTER — Ambulatory Visit: Payer: 59 | Attending: Cardiovascular Disease | Admitting: Cardiovascular Disease

## 2023-05-01 ENCOUNTER — Ambulatory Visit (INDEPENDENT_AMBULATORY_CARE_PROVIDER_SITE_OTHER): Payer: 59

## 2023-05-01 ENCOUNTER — Encounter: Payer: Self-pay | Admitting: Cardiovascular Disease

## 2023-05-01 VITALS — BP 98/64 | HR 123 | Ht <= 58 in | Wt 81.1 lb

## 2023-05-01 DIAGNOSIS — C20 Malignant neoplasm of rectum: Secondary | ICD-10-CM | POA: Diagnosis not present

## 2023-05-01 DIAGNOSIS — I480 Paroxysmal atrial fibrillation: Secondary | ICD-10-CM

## 2023-05-01 DIAGNOSIS — C8208 Follicular lymphoma grade I, lymph nodes of multiple sites: Secondary | ICD-10-CM | POA: Diagnosis not present

## 2023-05-01 NOTE — Patient Instructions (Addendum)
Medication Instructions:  No changes  If you need a refill on your cardiac medications before your next appointment, please call your pharmacy.   Lab work: No new labs needed  Testing/Procedures:  ZIO XT- Long Term Monitor Instructions  Your physician has requested you wear a ZIO patch monitor for 14 days.  This is a single patch monitor. [employer] supplies one patch monitor per enrollment. Additional stickers are not available. Please do not apply patch if you will be having a Nuclear Stress Test,  Echocardiogram, Cardiac CT, MRI, or Chest Xray during the period you would be wearing the  monitor. The patch cannot be worn during these tests. You cannot remove and re-apply the  ZIO XT patch monitor.  Your ZIO patch monitor will be mailed 3 day USPS to your address on file. It may take 3-5 days  to receive your monitor after you have been enrolled.  Once you have received your monitor, please review the enclosed instructions. Your monitor  has already been registered assigning a specific monitor serial # to you.  Billing and Patient Assistance Program Information  We have supplied [employer] with any of your insurance information on file for billing purposes. [employer] offers a sliding scale Patient Assistance Program for patients that do not have  insurance, or whose insurance does not completely cover the cost of the ZIO monitor.  You must apply for the Patient Assistance Program to qualify for this discounted rate.  To apply, please call [employer] at [phone], select option 4, select option 2, ask to apply for  Patient Assistance Program. Meredeth Ide will ask your household income, and how many people  are in your household. They will quote your out-of-pocket cost based on that information.  [employer] will also be able to set up a 80-month, interest-free payment plan if needed.  Applying the monitor   Shave hair from upper left chest.  Hold abrader disc by orange tab. Rub abrader in 40  strokes over the upper left chest as  indicated in your monitor instructions.  Clean area with 4 enclosed alcohol pads. Let dry.  Apply patch as indicated in monitor instructions. Patch will be placed under collarbone on left  side of chest with arrow pointing upward.  Rub patch adhesive wings for 2 minutes. Remove white label marked "1". Remove the white  label marked "2". Rub patch adhesive wings for 2 additional minutes.  While looking in a mirror, press and release button in center of patch. A small green light will  flash 3-4 times. This will be your only indicator that the monitor has been turned on.  Do not shower for the first 24 hours. You may shower after the first 24 hours.  Press the button if you feel a symptom. You will hear a small click. Record Date, Time and  Symptom in the Patient Logbook.  When you are ready to remove the patch, follow instructions on the last 2 pages of Patient  Logbook. Stick patch monitor onto the last page of Patient Logbook.  Place Patient Logbook in the blue and white box. Use locking tab on box and tape box closed  securely. The blue and white box has prepaid postage on it. Please place it in the mailbox as  soon as possible. Your physician should have your test results approximately 7 days after the  monitor has been mailed back to [facility].  Call [facility] Customer Care at [phone] if you have questions regarding  your ZIO XT patch monitor.  Call them immediately if you see an orange light blinking on your  monitor.  If your monitor falls off in less than 4 days, contact our Monitor department at [phone].  If your monitor becomes loose or falls off after 4 days call [employer] at [phone] for  suggestions on securing your monitor   Follow-Up: At [facility], you and your health needs are our priority.  As part of our continuing mission to provide you with exceptional heart care, we have created designated Provider Care  Teams.  These Care Teams include your primary Cardiologist (physician) and Advanced Practice Providers (APPs -  Physician Assistants and Nurse Practitioners) who all work together to provide you with the care you need, when you need it.  You will need a follow up appointment in 12 months  Providers on your designated Care Team:   Nicolasa Ducking, NP Eula Listen, PA-C Cadence Fransico Michael, [place]  COVID-19 Vaccine Information can be found at: [email or web address] For questions related to vaccine distribution or appointments, please email [email or web address] or call [phone].

## 2023-05-04 ENCOUNTER — Other Ambulatory Visit: Payer: Self-pay

## 2023-05-05 DIAGNOSIS — I480 Paroxysmal atrial fibrillation: Secondary | ICD-10-CM | POA: Diagnosis not present

## 2023-05-07 MED FILL — Dexamethasone Sodium Phosphate Inj 100 MG/10ML: INTRAMUSCULAR | Qty: 1 | Status: AC

## 2023-05-08 ENCOUNTER — Inpatient Hospital Stay: Payer: 59

## 2023-05-08 ENCOUNTER — Inpatient Hospital Stay: Payer: 59 | Attending: Oncology | Admitting: Oncology

## 2023-05-08 ENCOUNTER — Encounter: Payer: Self-pay | Admitting: Oncology

## 2023-05-08 VITALS — BP 136/81 | HR 100 | Resp 18

## 2023-05-08 DIAGNOSIS — Z5111 Encounter for antineoplastic chemotherapy: Secondary | ICD-10-CM | POA: Diagnosis present

## 2023-05-08 DIAGNOSIS — C799 Secondary malignant neoplasm of unspecified site: Secondary | ICD-10-CM | POA: Diagnosis not present

## 2023-05-08 DIAGNOSIS — Z5189 Encounter for other specified aftercare: Secondary | ICD-10-CM | POA: Diagnosis not present

## 2023-05-08 DIAGNOSIS — C20 Malignant neoplasm of rectum: Secondary | ICD-10-CM

## 2023-05-08 DIAGNOSIS — Z5112 Encounter for antineoplastic immunotherapy: Secondary | ICD-10-CM | POA: Diagnosis present

## 2023-05-08 DIAGNOSIS — Z79899 Other long term (current) drug therapy: Secondary | ICD-10-CM | POA: Insufficient documentation

## 2023-05-08 LAB — CBC WITH DIFFERENTIAL (CANCER CENTER ONLY)
Abs Immature Granulocytes: 0.08 10*3/uL — ABNORMAL HIGH (ref 0.00–0.07)
Basophils Absolute: 0.1 10*3/uL (ref 0.0–0.1)
Basophils Relative: 1 %
Eosinophils Absolute: 0.1 10*3/uL (ref 0.0–0.5)
Eosinophils Relative: 1 %
HCT: 40 % (ref 36.0–46.0)
Hemoglobin: 13 g/dL (ref 12.0–15.0)
Immature Granulocytes: 1 %
Lymphocytes Relative: 24 %
Lymphs Abs: 2.6 10*3/uL (ref 0.7–4.0)
MCH: 30.7 pg (ref 26.0–34.0)
MCHC: 32.5 g/dL (ref 30.0–36.0)
MCV: 94.3 fL (ref 80.0–100.0)
Monocytes Absolute: 1.2 10*3/uL — ABNORMAL HIGH (ref 0.1–1.0)
Monocytes Relative: 12 %
Neutro Abs: 6.6 10*3/uL (ref 1.7–7.7)
Neutrophils Relative %: 61 %
Platelet Count: 148 10*3/uL — ABNORMAL LOW (ref 150–400)
RBC: 4.24 MIL/uL (ref 3.87–5.11)
RDW: 17.5 % — ABNORMAL HIGH (ref 11.5–15.5)
WBC Count: 10.6 10*3/uL — ABNORMAL HIGH (ref 4.0–10.5)
nRBC: 0 % (ref 0.0–0.2)

## 2023-05-08 LAB — CMP (CANCER CENTER ONLY)
ALT: 11 U/L (ref 0–44)
AST: 21 U/L (ref 15–41)
Albumin: 3.9 g/dL (ref 3.5–5.0)
Alkaline Phosphatase: 72 U/L (ref 38–126)
Anion gap: 10 (ref 5–15)
BUN: 15 mg/dL (ref 8–23)
CO2: 25 mmol/L (ref 22–32)
Calcium: 9.2 mg/dL (ref 8.9–10.3)
Chloride: 102 mmol/L (ref 98–111)
Creatinine: 0.72 mg/dL (ref 0.44–1.00)
GFR, Estimated: >60 mL/min (ref >60)
Glucose, Bld: 137 mg/dL — ABNORMAL HIGH (ref 70–99)
Potassium: 3.8 mmol/L (ref 3.5–5.1)
Sodium: 137 mmol/L (ref 135–145)
Total Bilirubin: 0.3 mg/dL (ref 0.3–1.2)
Total Protein: 7 g/dL (ref 6.5–8.1)

## 2023-05-08 LAB — PROTEIN, URINE, RANDOM: Total Protein, Urine: 37 mg/dL

## 2023-05-08 MED ORDER — FLUOROURACIL CHEMO INJECTION 500 MG/10ML
400.0000 mg/m2 | Freq: Once | INTRAVENOUS | Status: AC
  Administered 2023-05-08: 500 mg via INTRAVENOUS
  Filled 2023-05-08: qty 10

## 2023-05-08 MED ORDER — SODIUM CHLORIDE 0.9 % IV SOLN
Freq: Once | INTRAVENOUS | Status: AC
  Filled 2023-05-08: qty 250

## 2023-05-08 MED ORDER — OXALIPLATIN CHEMO INJECTION 100 MG/20ML
85.0000 mg/m2 | Freq: Once | INTRAVENOUS | Status: AC
  Administered 2023-05-08: 100 mg via INTRAVENOUS
  Filled 2023-05-08: qty 6.16

## 2023-05-08 MED ORDER — FAMOTIDINE IN NACL 20-0.9 MG/50ML-% IV SOLN
20.0000 mg | Freq: Once | INTRAVENOUS | Status: AC
  Administered 2023-05-08: 20 mg via INTRAVENOUS
  Filled 2023-05-08: qty 50

## 2023-05-08 MED ORDER — LEUCOVORIN CALCIUM INJECTION 350 MG
400.0000 mg/m2 | Freq: Once | INTRAVENOUS | Status: AC
  Administered 2023-05-08: 496 mg via INTRAVENOUS
  Filled 2023-05-08: qty 24.8

## 2023-05-08 MED ORDER — PALONOSETRON HCL INJECTION 0.25 MG/5ML
0.2500 mg | Freq: Once | INTRAVENOUS | Status: AC
  Administered 2023-05-08: 0.25 mg via INTRAVENOUS
  Filled 2023-05-08: qty 5

## 2023-05-08 MED ORDER — SODIUM CHLORIDE 0.9 % IV SOLN
10.0000 mg | Freq: Once | INTRAVENOUS | Status: AC
  Administered 2023-05-08: 10 mg via INTRAVENOUS
  Filled 2023-05-08: qty 10

## 2023-05-08 MED ORDER — SODIUM CHLORIDE 0.9 % IV SOLN
5.0000 mg/kg | Freq: Once | INTRAVENOUS | Status: AC
  Administered 2023-05-08: 200 mg via INTRAVENOUS
  Filled 2023-05-08: qty 8

## 2023-05-08 MED ORDER — DIPHENHYDRAMINE HCL 50 MG/ML IJ SOLN
25.0000 mg | Freq: Once | INTRAMUSCULAR | Status: AC
  Administered 2023-05-08: 25 mg via INTRAVENOUS
  Filled 2023-05-08: qty 1

## 2023-05-08 MED ORDER — SODIUM CHLORIDE 0.9 % IV SOLN
2400.0000 mg/m2 | INTRAVENOUS | Status: DC
  Administered 2023-05-08: 3000 mg via INTRAVENOUS
  Filled 2023-05-08: qty 60

## 2023-05-08 MED ORDER — DEXTROSE 5 % IV SOLN
Freq: Once | INTRAVENOUS | Status: AC
  Filled 2023-05-08: qty 250

## 2023-05-08 NOTE — Patient Instructions (Signed)
[facility] [place] [facility]  Discharge Instructions: Thank you for choosing [facility] to provide your oncology and hematology care.  If you have a lab appointment with the Cancer Center, please go directly to the Cancer Center and check in at the registration area.  Wear comfortable clothing and clothing appropriate for easy access to any Portacath or PICC line.   We strive to give you quality time with your provider. You may need to reschedule your appointment if you arrive late (15 or more minutes).  Arriving late affects you and other patients whose appointments are after yours.  Also, if you miss three or more appointments without notifying the office, you may be dismissed from the clinic at the provider's discretion.      For prescription refill requests, have your pharmacy contact our office and allow 72 hours for refills to be completed.     To help prevent nausea and vomiting after your treatment, we encourage you to take your nausea medication as directed.  BELOW ARE SYMPTOMS THAT SHOULD BE REPORTED IMMEDIATELY: *FEVER GREATER THAN 100.4 F (38 C) OR HIGHER *CHILLS OR SWEATING *NAUSEA AND VOMITING THAT IS NOT CONTROLLED WITH YOUR NAUSEA MEDICATION *UNUSUAL SHORTNESS OF BREATH *UNUSUAL BRUISING OR BLEEDING *URINARY PROBLEMS (pain or burning when urinating, or frequent urination) *BOWEL PROBLEMS (unusual diarrhea, constipation, pain near the anus) TENDERNESS IN MOUTH AND THROAT WITH OR WITHOUT PRESENCE OF ULCERS (sore throat, sores in mouth, or a toothache) UNUSUAL RASH, SWELLING OR PAIN  UNUSUAL VAGINAL DISCHARGE OR ITCHING   Items with * indicate a potential emergency and should be followed up as soon as possible or go to the Emergency Department if any problems should occur.  Please show the CHEMOTHERAPY ALERT CARD or IMMUNOTHERAPY ALERT CARD at check-in to the Emergency Department and triage nurse.  Should you have questions after your visit  or need to cancel or reschedule your appointment, please contact [facility] [place] [facility]  [phone] and follow the prompts.  Office hours are 8:00 a.m. to 4:30 p.m. Monday - Friday. Please note that voicemails left after 4:00 p.m. may not be returned until the following business day.  We are closed weekends and major holidays. You have access to a nurse at all times for urgent questions. Please call the main number to the clinic [phone] and follow the prompts.  For any non-urgent questions, you may also contact your provider using MyChart. We now offer e-Visits for anyone 18 and older to request care online for non-urgent symptoms. For details visit [email or web address].   Also download the MyChart app! Go to the app store, search "MyChart", open the app, select [place], and log in with your MyChart username and password.    

## 2023-05-08 NOTE — Progress Notes (Signed)
Are we working out, or should I just plan to swim?  [facility]  [phone] [phone]) [phone]  ID: Veronica Boyd OB: [birth year]  MR#: [id number]  AOZ#:[id number]  Patient Care Team: Barbette Reichmann, MD as PCP - General (Internal Medicine) Jeralyn Ruths, MD as Consulting Physician (Oncology) Benita Gutter, RN as Oncology Nurse Navigator Salena Saner, MD as Consulting Physician (Pulmonary Disease)  CHIEF COMPLAINT: Recurrent stage IV rectal cancer, history of follicular lymphoma.  INTERVAL HISTORY: Patient returns to clinic today for further evaluation and consideration of cycle 3 of FOLFOX plus Avastin.  She currently feels well.  Her appetite has improved since initiating Megace.  She reports her pain is still well-controlled with tramadol.  She has a chronic peripheral neuropathy, but no other neurologic complaints.  She denies any recent fevers or illnesses.  She denies any chest pain, shortness of breath, or hemoptysis.  She has a chronic cough.  She has pelvic pain with bowel movements.  She denies any nausea, vomiting, constipation or diarrhea.  She has no urinary complaints.  Patient offers no further specific complaints today.  REVIEW OF SYSTEMS:   Review of Systems  Constitutional:  Positive for malaise/fatigue. Negative for fever and weight loss.  Respiratory: Negative.  Negative for cough, hemoptysis and shortness of breath.   Cardiovascular: Negative.  Negative for chest pain and leg swelling.  Gastrointestinal:  Negative for abdominal pain, blood in stool, diarrhea, melena, nausea and vomiting.  Genitourinary: Negative.  Negative for dysuria.  Musculoskeletal: Negative.  Negative for back pain, joint pain and neck pain.  Skin: Negative.  Negative for rash.  Neurological:  Positive for tingling, sensory change and weakness. Negative for focal weakness and headaches.  Psychiatric/Behavioral: Negative.  The patient is not  nervous/anxious and does not have insomnia.     As per HPI. Otherwise, a complete review of systems is negative.   PAST MEDICAL HISTORY: Past Medical History:  Diagnosis Date   Anemia due to antineoplastic chemotherapy    Arthritis    Bronchial obstruction    Cataract    bilateral   Chemotherapy-induced neuropathy (HCC)    Chicken pox    Colon polyp    Dyspnea    Follicular lymphoma (HCC) 08/2016   lymph nodes    GERD (gastroesophageal reflux disease)    Hyperlipidemia    Lung nodule    Lung nodules 09/2022   right upper lobe   Mass of right chest wall    Osteoporosis    Protein-calorie malnutrition, severe (HCC)    Rectal carcinoma (HCC)    Thrombocytopenia (HCC)     PAST SURGICAL HISTORY: Past Surgical History:  Procedure Laterality Date   AXILLARY LYMPH NODE DISSECTION Right 08/21/2016   Procedure: AXILLARY LYMPH NODE excision;  Surgeon: Nadeen Landau, MD;  Location: [facility] ORS;  Service: General;  Laterality: Right;   CATARACT EXTRACTION, BILATERAL Bilateral    COLONOSCOPY N/A 09/19/2020   Procedure: COLONOSCOPY;  Surgeon: Regis Bill, MD;  Location: [facility] ENDOSCOPY;  Service: Endoscopy;  Laterality: N/A;   PORTA CATH INSERTION N/A 09/16/2017   Procedure: PORTA CATH INSERTION;  Surgeon: Annice Needy, MD;  Location: [facility] INVASIVE CV LAB;  Service: Cardiovascular;  Laterality: N/A;   TONSILLECTOMY     TOTAL HIP ARTHROPLASTY Left 1992    FAMILY HISTORY: Family History  Problem Relation Age of Onset   Heart failure Mother    Emphysema Father    Diabetes Sister    Lung cancer  Brother    Diabetes Brother    Breast cancer Paternal Aunt    Basal cell carcinoma Daughter     ADVANCED DIRECTIVES (Y/N):  N  HEALTH MAINTENANCE: Social History   Tobacco Use   Smoking status: Never   Smokeless tobacco: Never  Vaping Use   Vaping Use: Never used  Substance Use Topics   Alcohol use: No   Drug use: No     Colonoscopy:  PAP:  Bone  density:  Lipid panel:  No Known Allergies  Current Outpatient Medications  Medication Sig Dispense Refill   diclofenac sodium (VOLTAREN) 1 % GEL Apply 2 g topically 2 (two) times daily as needed.     diphenoxylate-atropine (LOMOTIL) 2.5-0.025 MG tablet TAKE 2 TABLETS BY MOUTH 4 (FOUR) TIMES DAILY AS NEEDED FOR DIARRHEA OR LOOSE STOOLS. 60 tablet 1   gabapentin (NEURONTIN) 300 MG capsule TAKE 1 CAPSULE BY MOUTH TWICE A DAY 60 capsule 2   lidocaine-prilocaine (EMLA) cream Apply 1 Application topically as needed. Apply to port 1 hour prior to use as needed 30 g 2   megestrol (MEGACE) 40 MG tablet Take 1 tablet (40 mg total) by mouth daily. 90 tablet 1   ondansetron (ZOFRAN) 8 MG tablet Take 1 tablet (8 mg total) by mouth every 8 (eight) hours as needed for nausea or vomiting. Start on the third day after chemotherapy. 60 tablet 2   pantoprazole (PROTONIX) 20 MG tablet Take 1 tablet by mouth daily as needed for heartburn.     Potassium 99 MG TABS Take 1 tablet by mouth as needed.     simvastatin (ZOCOR) 20 MG tablet Take 20 mg by mouth at bedtime.     tiZANidine (ZANAFLEX) 2 MG tablet TAKE 1 TABLET BY MOUTH NIGHTLY AS NEEDED FOR MUSCLE SPASMS. 90 tablet 1   traMADol-acetaminophen (ULTRACET) 37.5-325 MG tablet Take 1 tablet by mouth every 8 (eight) hours as needed.     No current facility-administered medications for this visit.   Facility-Administered Medications Ordered in Other Visits  Medication Dose Route Frequency Provider Last Rate Last Admin   fluorouracil (ADRUCIL) 3,000 mg in sodium chloride 0.9 % 90 mL chemo infusion  2,400 mg/m2 (Treatment Plan Recorded) Intravenous 1 day or 1 dose Orlie Dakin, Tollie Pizza, MD       fluorouracil (ADRUCIL) chemo injection 500 mg  400 mg/m2 (Treatment Plan Recorded) Intravenous Once Jeralyn Ruths, MD       heparin lock flush 100 unit/mL  500 Units Intravenous Once Jeralyn Ruths, MD       heparin lock flush 100 unit/mL  500 Units Intracatheter  PRN Jeralyn Ruths, MD       heparin lock flush 100 unit/mL  500 Units Intravenous Once Jeralyn Ruths, MD       heparin lock flush 100 unit/mL  500 Units Intravenous Once Jeralyn Ruths, MD       leucovorin 496 mg in dextrose 5 % 250 mL infusion  400 mg/m2 (Treatment Plan Recorded) Intravenous Once Jeralyn Ruths, MD 137 mL/hr at 05/08/23 1213 496 mg at 05/08/23 1213   oxaliplatin (ELOXATIN) 100 mg in dextrose 5 % 500 mL chemo infusion  85 mg/m2 (Treatment Plan Recorded) Intravenous Once Jeralyn Ruths, MD 260 mL/hr at 05/08/23 1216 100 mg at 05/08/23 1216   sodium chloride flush (NS) 0.9 % injection 10 mL  10 mL Intravenous PRN Jeralyn Ruths, MD   10 mL at 12/04/17 0901   sodium chloride flush (  NS) 0.9 % injection 10 mL  10 mL Intracatheter PRN Jeralyn Ruths, MD       sodium chloride flush (NS) 0.9 % injection 10 mL  10 mL Intravenous PRN Jeralyn Ruths, MD   10 mL at 04/24/21 0839   sodium chloride flush (NS) 0.9 % injection 10 mL  10 mL Intracatheter PRN Jeralyn Ruths, MD   10 mL at 04/12/23 1419   yttrium-90 injection 22.3 millicurie  22.3 millicurie Intravenous Once Carmina Miller, MD        OBJECTIVE: Vitals:   05/08/23 0943  BP: 96/70  Pulse: 77  Resp: 18  Temp: (!) 97.5 F (36.4 C)  SpO2: 98%       Body mass index is 17.33 kg/m.    ECOG FS:0 - Asymptomatic  General: Well-developed, well-nourished, no acute distress. Eyes: Pink conjunctiva, anicteric sclera. HEENT: Normocephalic, moist mucous membranes. Lungs: No audible wheezing or coughing. Heart: Regular rate and rhythm. Abdomen: Soft, nontender, no obvious distention. Musculoskeletal: No edema, cyanosis, or clubbing. Neuro: Alert, answering all questions appropriately. Cranial nerves grossly intact. Skin: No rashes or petechiae noted. Psych: Normal affect.  LAB RESULTS:  Lab Results  Component Value Date   NA 137 05/08/2023   K 3.8 05/08/2023   CL 102 05/08/2023    CO2 25 05/08/2023   GLUCOSE 137 (H) 05/08/2023   BUN 15 05/08/2023   CREATININE 0.72 05/08/2023   CALCIUM 9.2 05/08/2023   PROT 7.0 05/08/2023   ALBUMIN 3.9 05/08/2023   AST 21 05/08/2023   ALT 11 05/08/2023   ALKPHOS 72 05/08/2023   BILITOT 0.3 05/08/2023   GFRNONAA >60 05/08/2023   GFRAA >60 06/28/2020    Lab Results  Component Value Date   WBC 10.6 (H) 05/08/2023   NEUTROABS 6.6 05/08/2023   HGB 13.0 05/08/2023   HCT 40.0 05/08/2023   MCV 94.3 05/08/2023   PLT 148 (L) 05/08/2023     STUDIES: No results found.  ONCOLOGY HISTORY:  Biopsy confirmed rectal cancer.  [facility] completed at [facility] on October 11, 2020 revealed extension of the tumor beyond the wall into the rectovaginal recess with suspected invasion of the vaginal wall posteriorly.  Tumor also appears to involve the internal anal sphincter.  There are also numerous prominent presacral and mesorectal lymph nodes highly suspicious for malignancy.  PET scan results from November 17, 2020 reviewed independently with 3 hypermetabolic right lower lobe lung nodules consistent with pulmonary metastasis.  Patient completed cycle 8 of FOLFOX on Mar 29, 2021.  She then completed cycle 5 of her 5-FU pump on May 26, 2021 and XRT on June 01, 2021.  PET scan results from July 17, 2021 reviewed independently with no evidence of disease other than enlarging right lower lobe lung lesion.  Patient completed SBRT to the right lung lesion on August 22, 2021.  Repeat PET scan on July 25, 2021 revealed minimal residual disease in the rectum and likely resolution of lesions in the lung.    ASSESSMENT: Recurrent stage IV rectal cancer, history of follicular lymphoma.  PLAN:    Recurrent stage IV rectal cancer: See oncology history as above.  PET scan results from Mar 21, 2023 reviewed independently with clear progression of disease in patient's pelvis as well as multiple pulmonary nodules.  Her CEA also continues to  significantly increase and is now 29.8.  We briefly mentioned hospice and end-of-life, but patient wishes to pursue treatment.  Proceed with cycle 3 of FOLFOX plus Avastin  today.  Return to clinic in 2 days for pump removal proceed with cycle 2 of FOLFOX plus Avastin today.  Return to clinic in 2 days for pump removal, Udenyca, and IV fluids.  Patient will then return to clinic in 2 weeks for further evaluation and consideration of cycle 4.   Recurrent follicular lymphoma: CT scan results from December 23, 2019 reviewed independently with no obvious evidence of recurrent or progressive disease.  PET scan results as above consistent with rectal cancer metastasis and no evidence of lymphoma.   Patient received Zevalin on July 30, 2017 with not much therapeutic effect.  She subsequently underwent cycles 5 of R-CHOP chemotherapy with Neulasta support completing on December 12, 2017.  Patient continues to be in complete remission.  Neuropathy: Chronic and unchanged. Pelvic pain: MRI as above.  Continue tramadol as prescribed.   Poor appetite: Improved.  Continue Megace. Hypokalemia: Resolved. Neutropenia: Resolved.  Continue Udenyca with pump removal.  Patient expressed understanding and was in agreement with this plan. She also understands that She can call clinic at any time with any questions, concerns, or complaints.    Cancer Staging  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 [facility]) Staging form: Lymphoid Neoplasms, AJCC 6th Edition - Clinical stage from 08/27/2016: Stage IIE - Signed by Jeralyn Ruths, MD on 08/27/2016  Rectal cancer [facility]) Staging form: Colon and Rectum, AJCC 8th Edition - Clinical: Stage IVA Dewaine Oats, [place]) - Signed by Jeralyn Ruths, MD on 11/02/2020   Jeralyn Ruths, MD   05/08/2023 12:42 PM

## 2023-05-10 ENCOUNTER — Inpatient Hospital Stay: Payer: 59

## 2023-05-10 VITALS — BP 102/71 | HR 96 | Temp 97.9°F | Resp 18

## 2023-05-10 DIAGNOSIS — C20 Malignant neoplasm of rectum: Secondary | ICD-10-CM

## 2023-05-10 DIAGNOSIS — Z5112 Encounter for antineoplastic immunotherapy: Secondary | ICD-10-CM | POA: Diagnosis not present

## 2023-05-10 LAB — CEA: CEA: 13.2 ng/mL — ABNORMAL HIGH (ref 0.0–4.7)

## 2023-05-10 MED ORDER — PEGFILGRASTIM-CBQV 6 MG/0.6ML ~~LOC~~ SOSY
6.0000 mg | PREFILLED_SYRINGE | Freq: Once | SUBCUTANEOUS | Status: AC
  Administered 2023-05-10: 6 mg via SUBCUTANEOUS
  Filled 2023-05-10: qty 0.6

## 2023-05-10 MED ORDER — SODIUM CHLORIDE 0.9 % IV SOLN
Freq: Once | INTRAVENOUS | Status: AC
  Filled 2023-05-10: qty 250

## 2023-05-10 MED ORDER — SODIUM CHLORIDE 0.9% FLUSH
10.0000 mL | Freq: Once | INTRAVENOUS | Status: AC | PRN
  Administered 2023-05-10: 10 mL
  Filled 2023-05-10: qty 10

## 2023-05-10 MED ORDER — HEPARIN SOD (PORK) LOCK FLUSH 100 UNIT/ML IV SOLN
500.0000 [IU] | Freq: Once | INTRAVENOUS | Status: AC | PRN
  Administered 2023-05-10: 500 [IU]
  Filled 2023-05-10: qty 5

## 2023-05-16 ENCOUNTER — Encounter: Payer: Self-pay | Admitting: Oncology

## 2023-05-17 ENCOUNTER — Telehealth: Payer: Self-pay | Admitting: Cardiovascular Disease

## 2023-05-17 NOTE — Telephone Encounter (Signed)
ZIO monitor report received from fax, index to media tab and gave to Triage nurse

## 2023-05-17 NOTE — Telephone Encounter (Signed)
Received and placed on MD desk for review

## 2023-05-22 ENCOUNTER — Inpatient Hospital Stay: Payer: 59

## 2023-05-22 ENCOUNTER — Inpatient Hospital Stay: Payer: 59 | Admitting: Oncology

## 2023-05-22 ENCOUNTER — Encounter: Payer: Self-pay | Admitting: Oncology

## 2023-05-22 DIAGNOSIS — C20 Malignant neoplasm of rectum: Secondary | ICD-10-CM

## 2023-05-22 DIAGNOSIS — Z5112 Encounter for antineoplastic immunotherapy: Secondary | ICD-10-CM | POA: Diagnosis not present

## 2023-05-22 LAB — CBC WITH DIFFERENTIAL (CANCER CENTER ONLY)
Abs Immature Granulocytes: 0.12 10*3/uL — ABNORMAL HIGH (ref 0.00–0.07)
Basophils Absolute: 0.1 10*3/uL (ref 0.0–0.1)
Basophils Relative: 1 %
Eosinophils Absolute: 0.1 10*3/uL (ref 0.0–0.5)
Eosinophils Relative: 1 %
HCT: 35.1 % — ABNORMAL LOW (ref 36.0–46.0)
Hemoglobin: 11.2 g/dL — ABNORMAL LOW (ref 12.0–15.0)
Immature Granulocytes: 1 %
Lymphocytes Relative: 20 %
Lymphs Abs: 1.7 10*3/uL (ref 0.7–4.0)
MCH: 31.5 pg (ref 26.0–34.0)
MCHC: 31.9 g/dL (ref 30.0–36.0)
MCV: 98.6 fL (ref 80.0–100.0)
Monocytes Absolute: 1 10*3/uL (ref 0.1–1.0)
Monocytes Relative: 12 %
Neutro Abs: 5.5 10*3/uL (ref 1.7–7.7)
Neutrophils Relative %: 65 %
Platelet Count: 121 10*3/uL — ABNORMAL LOW (ref 150–400)
RBC: 3.56 MIL/uL — ABNORMAL LOW (ref 3.87–5.11)
RDW: 19.3 % — ABNORMAL HIGH (ref 11.5–15.5)
WBC Count: 8.5 10*3/uL (ref 4.0–10.5)
nRBC: 0 % (ref 0.0–0.2)

## 2023-05-22 LAB — CMP (CANCER CENTER ONLY)
ALT: 14 U/L (ref 0–44)
AST: 23 U/L (ref 15–41)
Albumin: 3.6 g/dL (ref 3.5–5.0)
Alkaline Phosphatase: 83 U/L (ref 38–126)
Anion gap: 9 (ref 5–15)
BUN: 12 mg/dL (ref 8–23)
CO2: 24 mmol/L (ref 22–32)
Calcium: 8.6 mg/dL — ABNORMAL LOW (ref 8.9–10.3)
Chloride: 105 mmol/L (ref 98–111)
Creatinine: 0.84 mg/dL (ref 0.44–1.00)
GFR, Estimated: >60 mL/min (ref >60)
Glucose, Bld: 131 mg/dL — ABNORMAL HIGH (ref 70–99)
Potassium: 3.9 mmol/L (ref 3.5–5.1)
Sodium: 138 mmol/L (ref 135–145)
Total Bilirubin: 0.4 mg/dL (ref 0.3–1.2)
Total Protein: 6.6 g/dL (ref 6.5–8.1)

## 2023-05-22 MED ORDER — SODIUM CHLORIDE 0.9 % IV SOLN
5.0000 mg/kg | Freq: Once | INTRAVENOUS | Status: AC
  Administered 2023-05-22: 200 mg via INTRAVENOUS
  Filled 2023-05-22: qty 8

## 2023-05-22 MED ORDER — DEXTROSE 5 % IV SOLN
Freq: Once | INTRAVENOUS | Status: AC
  Filled 2023-05-22: qty 250

## 2023-05-22 MED ORDER — FLUOROURACIL CHEMO INJECTION 500 MG/10ML
400.0000 mg/m2 | Freq: Once | INTRAVENOUS | Status: AC
  Administered 2023-05-22: 500 mg via INTRAVENOUS
  Filled 2023-05-22: qty 10

## 2023-05-22 MED ORDER — LEUCOVORIN CALCIUM INJECTION 350 MG
400.0000 mg/m2 | Freq: Once | INTRAVENOUS | Status: AC
  Administered 2023-05-22: 496 mg via INTRAVENOUS
  Filled 2023-05-22: qty 24.8

## 2023-05-22 MED ORDER — OXALIPLATIN CHEMO INJECTION 100 MG/20ML
85.0000 mg/m2 | Freq: Once | INTRAVENOUS | Status: AC
  Administered 2023-05-22: 100 mg via INTRAVENOUS
  Filled 2023-05-22: qty 6.13

## 2023-05-22 MED ORDER — SODIUM CHLORIDE 0.9 % IV SOLN
Freq: Once | INTRAVENOUS | Status: AC
  Filled 2023-05-22: qty 250

## 2023-05-22 MED ORDER — SODIUM CHLORIDE 0.9 % IV SOLN
10.0000 mg | Freq: Once | INTRAVENOUS | Status: AC
  Administered 2023-05-22: 10 mg via INTRAVENOUS
  Filled 2023-05-22: qty 10

## 2023-05-22 MED ORDER — PALONOSETRON HCL INJECTION 0.25 MG/5ML
0.2500 mg | Freq: Once | INTRAVENOUS | Status: AC
  Administered 2023-05-22: 0.25 mg via INTRAVENOUS
  Filled 2023-05-22: qty 5

## 2023-05-22 MED ORDER — SODIUM CHLORIDE 0.9 % IV SOLN
2400.0000 mg/m2 | INTRAVENOUS | Status: DC
  Administered 2023-05-22: 3000 mg via INTRAVENOUS
  Filled 2023-05-22: qty 60

## 2023-05-22 MED ORDER — FAMOTIDINE IN NACL 20-0.9 MG/50ML-% IV SOLN
20.0000 mg | Freq: Once | INTRAVENOUS | Status: AC
  Administered 2023-05-22: 20 mg via INTRAVENOUS
  Filled 2023-05-22: qty 50

## 2023-05-22 MED ORDER — DIPHENHYDRAMINE HCL 50 MG/ML IJ SOLN
25.0000 mg | Freq: Once | INTRAMUSCULAR | Status: AC
  Administered 2023-05-22: 25 mg via INTRAVENOUS
  Filled 2023-05-22: qty 1

## 2023-05-22 NOTE — Progress Notes (Signed)
Nutrition Follow-up:  Patient with recurrent stage IV rectal cancer history of follicular lymphoma.  Patient on folfox and avastin.    Met with patient during infusion with daughter.  Appetite typically decreases Friday night through Tuesday/Wednesday following treatment on Wednesday then improves just prior to returning for next cycle.  Daughter prepares meals for patient.  Feels like megace is increasing her appetite some.  Had a mini banana split recently and bag of popcorn.      Medications: reviewed  Labs: reviewed  Anthropometrics:   Weight 86 lb on 7/17  96 lb 6.4 oz on 06/11/22 (last seen by RD)  10% weight loss in the last 11 months   NUTRITION DIAGNOSIS: Inadequate oral intake decreased    INTERVENTION:  Daughter wanting patient to try ensure clear shakes.  Samples given. Also discussed boost breeze and soothe as options.  Continue appetite stimulant Encouraged high calorie, high protein foods    MONITORING, EVALUATION, GOAL: weight trends, intake   NEXT VISIT: Wednesday, August 14 during infusion  Clemon Devaul B. Freida Busman, RD, LDN Registered Dietitian [phone]

## 2023-05-22 NOTE — Progress Notes (Signed)
Are we working out, or should I just plan to swim? [facility]  Telephone:([phone]) [phone] [phone]  ID: Veronica Boyd OB: [birth year]  MR#: [id number]  KVQ#:[id number]  Patient Care Team: Barbette Reichmann, MD as PCP - General (Internal Medicine) Jeralyn Ruths, MD as Consulting Physician (Oncology) Benita Gutter, RN as Oncology Nurse Navigator Salena Saner, MD as Consulting Physician (Pulmonary Disease)  CHIEF COMPLAINT: Recurrent stage IV rectal cancer, history of follicular lymphoma.  INTERVAL HISTORY: Patient returns to clinic today for further evaluation and consideration of cycle 4 of FOLFOX plus Avastin.  She is tolerating her treatments well without significant side effects.  She continues to have a good appetite and gained weight.  Her pain is well-controlled with tramadol.  She has a chronic peripheral neuropathy, but no other neurologic complaints.  She denies any recent fevers or illnesses.  She denies any chest pain, shortness of breath, or hemoptysis.  She has a chronic cough.  She has pelvic pain with bowel movements.  She denies any nausea, vomiting, constipation or diarrhea.  She has no urinary complaints.  Patient offers no further specific complaints today.  REVIEW OF SYSTEMS:   Review of Systems  Constitutional: Negative.  Negative for fever, malaise/fatigue and weight loss.  Respiratory:  Positive for cough. Negative for hemoptysis and shortness of breath.   Cardiovascular: Negative.  Negative for chest pain and leg swelling.  Gastrointestinal:  Negative for abdominal pain, blood in stool, diarrhea, melena, nausea and vomiting.  Genitourinary: Negative.  Negative for dysuria.  Musculoskeletal: Negative.  Negative for back pain, joint pain and neck pain.  Skin: Negative.  Negative for rash.  Neurological:  Positive for tingling and sensory change. Negative for focal weakness, weakness and headaches.  Psychiatric/Behavioral:  Negative.  The patient is not nervous/anxious and does not have insomnia.     As per HPI. Otherwise, a complete review of systems is negative.   PAST MEDICAL HISTORY: Past Medical History:  Diagnosis Date   Anemia due to antineoplastic chemotherapy    Arthritis    Bronchial obstruction    Cataract    bilateral   Chemotherapy-induced neuropathy (HCC)    Chicken pox    Colon polyp    Dyspnea    Follicular lymphoma (HCC) 08/2016   lymph nodes    GERD (gastroesophageal reflux disease)    Hyperlipidemia    Lung nodule    Lung nodules 09/2022   right upper lobe   Mass of right chest wall    Osteoporosis    Protein-calorie malnutrition, severe (HCC)    Rectal carcinoma (HCC)    Thrombocytopenia (HCC)     PAST SURGICAL HISTORY: Past Surgical History:  Procedure Laterality Date   AXILLARY LYMPH NODE DISSECTION Right 08/21/2016   Procedure: AXILLARY LYMPH NODE excision;  Surgeon: Nadeen Landau, MD;  Location: [facility] ORS;  Service: General;  Laterality: Right;   CATARACT EXTRACTION, BILATERAL Bilateral    COLONOSCOPY N/A 09/19/2020   Procedure: COLONOSCOPY;  Surgeon: Regis Bill, MD;  Location: [facility] ENDOSCOPY;  Service: Endoscopy;  Laterality: N/A;   PORTA CATH INSERTION N/A 09/16/2017   Procedure: PORTA CATH INSERTION;  Surgeon: Annice Needy, MD;  Location: [facility] INVASIVE CV LAB;  Service: Cardiovascular;  Laterality: N/A;   TONSILLECTOMY     TOTAL HIP ARTHROPLASTY Left 1992    FAMILY HISTORY: Family History  Problem Relation Age of Onset   Heart failure Mother    Emphysema Father    Diabetes  Sister    Lung cancer Brother    Diabetes Brother    Breast cancer Paternal Aunt    Basal cell carcinoma Daughter     ADVANCED DIRECTIVES (Y/N):  N  HEALTH MAINTENANCE: Social History   Tobacco Use   Smoking status: Never   Smokeless tobacco: Never  Vaping Use   Vaping status: Never Used  Substance Use Topics   Alcohol use: No   Drug use: No      Colonoscopy:  PAP:  Bone density:  Lipid panel:  No Known Allergies  Current Outpatient Medications  Medication Sig Dispense Refill   diclofenac sodium (VOLTAREN) 1 % GEL Apply 2 g topically 2 (two) times daily as needed.     diphenoxylate-atropine (LOMOTIL) 2.5-0.025 MG tablet TAKE 2 TABLETS BY MOUTH 4 (FOUR) TIMES DAILY AS NEEDED FOR DIARRHEA OR LOOSE STOOLS. 60 tablet 1   gabapentin (NEURONTIN) 300 MG capsule TAKE 1 CAPSULE BY MOUTH TWICE A DAY 60 capsule 2   lidocaine-prilocaine (EMLA) cream Apply 1 Application topically as needed. Apply to port 1 hour prior to use as needed 30 g 2   megestrol (MEGACE) 40 MG tablet Take 1 tablet (40 mg total) by mouth daily. 90 tablet 1   ondansetron (ZOFRAN) 8 MG tablet Take 1 tablet (8 mg total) by mouth every 8 (eight) hours as needed for nausea or vomiting. Start on the third day after chemotherapy. 60 tablet 2   pantoprazole (PROTONIX) 20 MG tablet Take 1 tablet by mouth daily as needed for heartburn.     Potassium 99 MG TABS Take 1 tablet by mouth as needed.     simvastatin (ZOCOR) 20 MG tablet Take 20 mg by mouth at bedtime.     tiZANidine (ZANAFLEX) 2 MG tablet TAKE 1 TABLET BY MOUTH NIGHTLY AS NEEDED FOR MUSCLE SPASMS. 90 tablet 1   traMADol-acetaminophen (ULTRACET) 37.5-325 MG tablet Take 1 tablet by mouth every 8 (eight) hours as needed.     No current facility-administered medications for this visit.   Facility-Administered Medications Ordered in Other Visits  Medication Dose Route Frequency Provider Last Rate Last Admin   bevacizumab-awwb (MVASI) 200 mg in sodium chloride 0.9 % 100 mL chemo infusion  5 mg/kg (Treatment Plan Recorded) Intravenous Once Jeralyn Ruths, MD       fluorouracil (ADRUCIL) 3,000 mg in sodium chloride 0.9 % 90 mL chemo infusion  2,400 mg/m2 (Treatment Plan Recorded) Intravenous 1 day or 1 dose Orlie Dakin, Tollie Pizza, MD       fluorouracil (ADRUCIL) chemo injection 500 mg  400 mg/m2 (Treatment Plan Recorded)  Intravenous Once Jeralyn Ruths, MD       heparin lock flush 100 unit/mL  500 Units Intravenous Once Jeralyn Ruths, MD       heparin lock flush 100 unit/mL  500 Units Intracatheter PRN Jeralyn Ruths, MD       heparin lock flush 100 unit/mL  500 Units Intravenous Once Jeralyn Ruths, MD       heparin lock flush 100 unit/mL  500 Units Intravenous Once Jeralyn Ruths, MD       leucovorin 496 mg in dextrose 5 % 250 mL infusion  400 mg/m2 (Treatment Plan Recorded) Intravenous Once Jeralyn Ruths, MD       oxaliplatin (ELOXATIN) 100 mg in dextrose 5 % 500 mL chemo infusion  85 mg/m2 (Treatment Plan Recorded) Intravenous Once Jeralyn Ruths, MD       sodium chloride flush (NS) 0.9 %  injection 10 mL  10 mL Intravenous PRN Jeralyn Ruths, MD   10 mL at 12/04/17 0901   sodium chloride flush (NS) 0.9 % injection 10 mL  10 mL Intracatheter PRN Jeralyn Ruths, MD       sodium chloride flush (NS) 0.9 % injection 10 mL  10 mL Intravenous PRN Jeralyn Ruths, MD   10 mL at 04/24/21 0839   sodium chloride flush (NS) 0.9 % injection 10 mL  10 mL Intracatheter PRN Jeralyn Ruths, MD   10 mL at 04/12/23 1419   yttrium-90 injection 22.3 millicurie  22.3 millicurie Intravenous Once Carmina Miller, MD        OBJECTIVE: Vitals:   05/22/23 0849  BP: (!) 132/57  Pulse: 95  Resp: 16  Temp: (!) 97.4 F (36.3 C)  SpO2: 100%       Body mass index is 17.97 kg/m.    ECOG FS:0 - Asymptomatic  General: Well-developed, well-nourished, no acute distress. Eyes: Pink conjunctiva, anicteric sclera. HEENT: Normocephalic, moist mucous membranes. Lungs: No audible wheezing or coughing. Heart: Regular rate and rhythm. Abdomen: Soft, nontender, no obvious distention. Musculoskeletal: No edema, cyanosis, or clubbing. Neuro: Alert, answering all questions appropriately. Cranial nerves grossly intact. Skin: No rashes or petechiae noted. Psych: Normal affect.  LAB  RESULTS:  Lab Results  Component Value Date   NA 138 05/22/2023   K 3.9 05/22/2023   CL 105 05/22/2023   CO2 24 05/22/2023   GLUCOSE 131 (H) 05/22/2023   BUN 12 05/22/2023   CREATININE 0.84 05/22/2023   CALCIUM 8.6 (L) 05/22/2023   PROT 6.6 05/22/2023   ALBUMIN 3.6 05/22/2023   AST 23 05/22/2023   ALT 14 05/22/2023   ALKPHOS 83 05/22/2023   BILITOT 0.4 05/22/2023   GFRNONAA >60 05/22/2023   GFRAA >60 06/28/2020    Lab Results  Component Value Date   WBC 8.5 05/22/2023   NEUTROABS 5.5 05/22/2023   HGB 11.2 (L) 05/22/2023   HCT 35.1 (L) 05/22/2023   MCV 98.6 05/22/2023   PLT 121 (L) 05/22/2023     STUDIES: No results found.  ONCOLOGY HISTORY:  Biopsy confirmed rectal cancer.  [facility] completed [facility] on October 11, 2020 revealed extension of the tumor beyond the wall into the rectovaginal recess with suspected invasion of the vaginal wall posteriorly.  Tumor also appears to involve the internal anal sphincter.  There are also numerous prominent presacral and mesorectal lymph nodes highly suspicious for malignancy.  PET scan results from November 17, 2020 reviewed independently with 3 hypermetabolic right lower lobe lung nodules consistent with pulmonary metastasis.  Patient completed cycle 8 of FOLFOX on Mar 29, 2021.  She then completed cycle 5 of her 5-FU pump on May 26, 2021 and XRT on June 01, 2021.  PET scan results from July 17, 2021 reviewed independently with no evidence of disease other than enlarging right lower lobe lung lesion.  Patient completed SBRT to the right lung lesion on August 22, 2021.  Repeat PET scan on July 25, 2021 revealed minimal residual disease in the rectum and likely resolution of lesions in the lung.    ASSESSMENT: Recurrent stage IV rectal cancer, history of follicular lymphoma.  PLAN:    Recurrent stage IV rectal cancer: See oncology history as above.  PET scan results from Mar 21, 2023 reviewed independently with clear  progression of disease in patient's pelvis as well as multiple pulmonary nodules.  Her CEA initially increased to 29.8,  but now has trended down to 13.2.  Hospice and end-of-life care were previously discussed, but patient preferred to pursue treatment.  Proceed with cycle 4 of FOLFOX plus Avastin today.  Return to clinic in 2 days for pump removal, IV fluids, and Udenyca.  Patient will then return to clinic in 2 weeks for further evaluation and consideration of cycle 5.  Plan to reimage after cycle 6.   Recurrent follicular lymphoma: CT scan results from December 23, 2019 reviewed independently with no obvious evidence of recurrent or progressive disease.  PET scan results as above consistent with rectal cancer metastasis and no evidence of lymphoma.   Patient received Zevalin on July 30, 2017 with not much therapeutic effect.  She subsequently underwent cycles 5 of R-CHOP chemotherapy with Neulasta support completing on December 12, 2017.  Patient continues to be in complete remission.  Neuropathy: Chronic and unchanged. Pelvic pain: Old.  Continue tramadol as prescribed.   Poor appetite/weight loss: Improved.  Continue Megace as prescribed. Neutropenia: Resolved.  Continue Udenyca with pump removal. Anemia: Mild, monitor.  Patient's hemoglobin is 11.2. Thrombocytopenia: Mild, monitor.  Patient's platelet count is 121.  Proceed with treatment as above.  Patient expressed understanding and was in agreement with this plan. She also understands that She can call clinic at any time with any questions, concerns, or complaints.    Cancer Staging  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 [facility]) Staging form: Lymphoid Neoplasms, AJCC 6th Edition - Clinical stage from 08/27/2016: Stage IIE - Signed by Jeralyn Ruths, MD on 08/27/2016  Rectal cancer [facility]) Staging form: Colon and Rectum, AJCC 8th Edition - Clinical: Stage IVA Dewaine Oats, [place]) - Signed by Jeralyn Ruths,  MD on 11/02/2020   Jeralyn Ruths, MD   05/22/2023 10:31 AM

## 2023-05-22 NOTE — Patient Instructions (Signed)
[place] [facility]  Discharge Instructions: Thank you for choosing [facility] to provide your oncology and hematology care.  If you have a lab appointment with the Cancer Center, please go directly to the Cancer Center and check in at the registration area.  Wear comfortable clothing and clothing appropriate for easy access to any Portacath or PICC line.   We strive to give you quality time with your provider. You may need to reschedule your appointment if you arrive late (15 or more minutes).  Arriving late affects you and other patients whose appointments are after yours.  Also, if you miss three or more appointments without notifying the office, you may be dismissed from the clinic at the provider's discretion.      For prescription refill requests, have your pharmacy contact our office and allow 72 hours for refills to be completed.    Today you received the following chemotherapy and/or immunotherapy agents- avastin, leucovorin, oxaliplatin, 5FU      To help prevent nausea and vomiting after your treatment, we encourage you to take your nausea medication as directed.  BELOW ARE SYMPTOMS THAT SHOULD BE REPORTED IMMEDIATELY: *FEVER GREATER THAN 100.4 F (38 C) OR HIGHER *CHILLS OR SWEATING *NAUSEA AND VOMITING THAT IS NOT CONTROLLED WITH YOUR NAUSEA MEDICATION *UNUSUAL SHORTNESS OF BREATH *UNUSUAL BRUISING OR BLEEDING *URINARY PROBLEMS (pain or burning when urinating, or frequent urination) *BOWEL PROBLEMS (unusual diarrhea, constipation, pain near the anus) TENDERNESS IN MOUTH AND THROAT WITH OR WITHOUT PRESENCE OF ULCERS (sore throat, sores in mouth, or a toothache) UNUSUAL RASH, SWELLING OR PAIN  UNUSUAL VAGINAL DISCHARGE OR ITCHING   Items with * indicate a potential emergency and should be followed up as soon as possible or go to the Emergency Department if any problems should occur.  Please show the CHEMOTHERAPY ALERT CARD or  IMMUNOTHERAPY ALERT CARD at check-in to the Emergency Department and triage nurse.  Should you have questions after your visit or need to cancel or reschedule your appointment, please contact [place] [facility]  [phone] and follow the prompts.  Office hours are 8:00 a.m. to 4:30 p.m. Monday - Friday. Please note that voicemails left after 4:00 p.m. may not be returned until the following business day.  We are closed weekends and major holidays. You have access to a nurse at all times for urgent questions. Please call the main number to the clinic [phone] and follow the prompts.  For any non-urgent questions, you may also contact your provider using MyChart. We now offer e-Visits for anyone 109 and older to request care online for non-urgent symptoms. For details visit [email or web address].   Also download the MyChart app! Go to the app store, search "MyChart", open the app, select [place], and log in with your MyChart username and password.

## 2023-05-24 ENCOUNTER — Inpatient Hospital Stay: Payer: 59

## 2023-05-24 VITALS — BP 122/93 | HR 86 | Temp 98.5°F | Resp 18

## 2023-05-24 DIAGNOSIS — C20 Malignant neoplasm of rectum: Secondary | ICD-10-CM

## 2023-05-24 DIAGNOSIS — Z5112 Encounter for antineoplastic immunotherapy: Secondary | ICD-10-CM | POA: Diagnosis not present

## 2023-05-24 MED ORDER — SODIUM CHLORIDE 0.9 % IV SOLN
Freq: Once | INTRAVENOUS | Status: AC
  Filled 2023-05-24: qty 250

## 2023-05-24 MED ORDER — PEGFILGRASTIM-CBQV 6 MG/0.6ML ~~LOC~~ SOSY
6.0000 mg | PREFILLED_SYRINGE | Freq: Once | SUBCUTANEOUS | Status: AC
  Administered 2023-05-24: 6 mg via SUBCUTANEOUS

## 2023-05-24 MED ORDER — HEPARIN SOD (PORK) LOCK FLUSH 100 UNIT/ML IV SOLN
500.0000 [IU] | Freq: Once | INTRAVENOUS | Status: AC | PRN
  Administered 2023-05-24: 500 [IU]
  Filled 2023-05-24: qty 5

## 2023-05-24 NOTE — Patient Instructions (Signed)
[place] [facility]  Discharge Instructions: Thank you for choosing [facility] to provide your oncology and hematology care.  If you have a lab appointment with the Cancer Center, please go directly to the Cancer Center and check in at the registration area.  Wear comfortable clothing and clothing appropriate for easy access to any Portacath or PICC line.   We strive to give you quality time with your provider. You may need to reschedule your appointment if you arrive late (15 or more minutes).  Arriving late affects you and other patients whose appointments are after yours.  Also, if you miss three or more appointments without notifying the office, you may be dismissed from the clinic at the provider's discretion.      For prescription refill requests, have your pharmacy contact our office and allow 72 hours for refills to be completed.    Today you received the following chemotherapy and/or immunotherapy agents Udenyca.      To help prevent nausea and vomiting after your treatment, we encourage you to take your nausea medication as directed.  BELOW ARE SYMPTOMS THAT SHOULD BE REPORTED IMMEDIATELY: *FEVER GREATER THAN 100.4 F (38 C) OR HIGHER *CHILLS OR SWEATING *NAUSEA AND VOMITING THAT IS NOT CONTROLLED WITH YOUR NAUSEA MEDICATION *UNUSUAL SHORTNESS OF BREATH *UNUSUAL BRUISING OR BLEEDING *URINARY PROBLEMS (pain or burning when urinating, or frequent urination) *BOWEL PROBLEMS (unusual diarrhea, constipation, pain near the anus) TENDERNESS IN MOUTH AND THROAT WITH OR WITHOUT PRESENCE OF ULCERS (sore throat, sores in mouth, or a toothache) UNUSUAL RASH, SWELLING OR PAIN  UNUSUAL VAGINAL DISCHARGE OR ITCHING   Items with * indicate a potential emergency and should be followed up as soon as possible or go to the Emergency Department if any problems should occur.  Please show the CHEMOTHERAPY ALERT CARD or IMMUNOTHERAPY ALERT CARD at check-in to  the Emergency Department and triage nurse.  Should you have questions after your visit or need to cancel or reschedule your appointment, please contact [place] [facility]  [phone] and follow the prompts.  Office hours are 8:00 a.m. to 4:30 p.m. Monday - Friday. Please note that voicemails left after 4:00 p.m. may not be returned until the following business day.  We are closed weekends and major holidays. You have access to a nurse at all times for urgent questions. Please call the main number to the clinic [phone] and follow the prompts.  For any non-urgent questions, you may also contact your provider using MyChart. We now offer e-Visits for anyone 29 and older to request care online for non-urgent symptoms. For details visit [email or web address].   Also download the MyChart app! Go to the app store, search "MyChart", open the app, select [place], and log in with your MyChart username and password.

## 2023-05-24 NOTE — Progress Notes (Signed)
Ok per provider to proceed with [place] today.

## 2023-06-03 ENCOUNTER — Encounter: Payer: Self-pay | Admitting: Oncology

## 2023-06-05 ENCOUNTER — Inpatient Hospital Stay: Payer: 59

## 2023-06-05 ENCOUNTER — Encounter: Payer: Self-pay | Admitting: Oncology

## 2023-06-05 ENCOUNTER — Inpatient Hospital Stay: Payer: 59 | Admitting: Oncology

## 2023-06-05 ENCOUNTER — Other Ambulatory Visit: Payer: Self-pay

## 2023-06-05 DIAGNOSIS — Z5189 Encounter for other specified aftercare: Secondary | ICD-10-CM

## 2023-06-05 DIAGNOSIS — C20 Malignant neoplasm of rectum: Secondary | ICD-10-CM

## 2023-06-05 DIAGNOSIS — C8208 Follicular lymphoma grade I, lymph nodes of multiple sites: Secondary | ICD-10-CM

## 2023-06-05 DIAGNOSIS — Z5112 Encounter for antineoplastic immunotherapy: Secondary | ICD-10-CM | POA: Diagnosis not present

## 2023-06-05 DIAGNOSIS — Z09 Encounter for follow-up examination after completed treatment for conditions other than malignant neoplasm: Secondary | ICD-10-CM

## 2023-06-05 LAB — CMP (CANCER CENTER ONLY)
ALT: 15 U/L (ref 0–44)
AST: 23 U/L (ref 15–41)
Albumin: 3.6 g/dL (ref 3.5–5.0)
Alkaline Phosphatase: 89 U/L (ref 38–126)
Anion gap: 9 (ref 5–15)
BUN: 9 mg/dL (ref 8–23)
CO2: 23 mmol/L (ref 22–32)
Calcium: 9 mg/dL (ref 8.9–10.3)
Chloride: 107 mmol/L (ref 98–111)
Creatinine: 0.76 mg/dL (ref 0.44–1.00)
GFR, Estimated: >60 mL/min (ref >60)
Glucose, Bld: 108 mg/dL — ABNORMAL HIGH (ref 70–99)
Potassium: 3.5 mmol/L (ref 3.5–5.1)
Sodium: 139 mmol/L (ref 135–145)
Total Bilirubin: 0.4 mg/dL (ref 0.3–1.2)
Total Protein: 6.5 g/dL (ref 6.5–8.1)

## 2023-06-05 LAB — CBC WITH DIFFERENTIAL (CANCER CENTER ONLY)
Abs Immature Granulocytes: 0.09 10*3/uL — ABNORMAL HIGH (ref 0.00–0.07)
Basophils Absolute: 0.1 10*3/uL (ref 0.0–0.1)
Basophils Relative: 1 %
Eosinophils Absolute: 0.1 10*3/uL (ref 0.0–0.5)
Eosinophils Relative: 1 %
HCT: 37.7 % (ref 36.0–46.0)
Hemoglobin: 12 g/dL (ref 12.0–15.0)
Immature Granulocytes: 1 %
Lymphocytes Relative: 23 %
Lymphs Abs: 2.2 10*3/uL (ref 0.7–4.0)
MCH: 32 pg (ref 26.0–34.0)
MCHC: 31.8 g/dL (ref 30.0–36.0)
MCV: 100.5 fL — ABNORMAL HIGH (ref 80.0–100.0)
Monocytes Absolute: 1.5 10*3/uL — ABNORMAL HIGH (ref 0.1–1.0)
Monocytes Relative: 15 %
Neutro Abs: 6 10*3/uL (ref 1.7–7.7)
Neutrophils Relative %: 59 %
Platelet Count: 122 10*3/uL — ABNORMAL LOW (ref 150–400)
RBC: 3.75 MIL/uL — ABNORMAL LOW (ref 3.87–5.11)
RDW: 19.6 % — ABNORMAL HIGH (ref 11.5–15.5)
WBC Count: 9.8 10*3/uL (ref 4.0–10.5)
nRBC: 0 % (ref 0.0–0.2)

## 2023-06-05 LAB — PROTEIN, URINE, RANDOM: Total Protein, Urine: 12 mg/dL

## 2023-06-05 MED ORDER — PALONOSETRON HCL INJECTION 0.25 MG/5ML
0.2500 mg | Freq: Once | INTRAVENOUS | Status: AC
  Administered 2023-06-05: 0.25 mg via INTRAVENOUS
  Filled 2023-06-05: qty 5

## 2023-06-05 MED ORDER — OXALIPLATIN CHEMO INJECTION 100 MG/20ML
85.0000 mg/m2 | Freq: Once | INTRAVENOUS | Status: AC
  Administered 2023-06-05: 100 mg via INTRAVENOUS
  Filled 2023-06-05: qty 6.31

## 2023-06-05 MED ORDER — SODIUM CHLORIDE 0.9 % IV SOLN
Freq: Once | INTRAVENOUS | Status: AC
  Filled 2023-06-05: qty 250

## 2023-06-05 MED ORDER — SODIUM CHLORIDE 0.9% FLUSH
10.0000 mL | Freq: Once | INTRAVENOUS | Status: AC
  Administered 2023-06-05: 10 mL via INTRAVENOUS
  Filled 2023-06-05: qty 10

## 2023-06-05 MED ORDER — SODIUM CHLORIDE 0.9 % IV SOLN
2400.0000 mg/m2 | INTRAVENOUS | Status: DC
  Administered 2023-06-05: 3000 mg via INTRAVENOUS
  Filled 2023-06-05: qty 60

## 2023-06-05 MED ORDER — DEXTROSE 5 % IV SOLN
Freq: Once | INTRAVENOUS | Status: AC
  Filled 2023-06-05: qty 250

## 2023-06-05 MED ORDER — FAMOTIDINE IN NACL 20-0.9 MG/50ML-% IV SOLN
20.0000 mg | Freq: Once | INTRAVENOUS | Status: AC
  Administered 2023-06-05: 20 mg via INTRAVENOUS
  Filled 2023-06-05: qty 50

## 2023-06-05 MED ORDER — FLUOROURACIL CHEMO INJECTION 500 MG/10ML
400.0000 mg/m2 | Freq: Once | INTRAVENOUS | Status: AC
  Administered 2023-06-05: 500 mg via INTRAVENOUS
  Filled 2023-06-05: qty 10

## 2023-06-05 MED ORDER — SODIUM CHLORIDE 0.9 % IV SOLN
10.0000 mg | Freq: Once | INTRAVENOUS | Status: AC
  Administered 2023-06-05: 10 mg via INTRAVENOUS
  Filled 2023-06-05: qty 10

## 2023-06-05 MED ORDER — SODIUM CHLORIDE 0.9 % IV SOLN
5.0000 mg/kg | Freq: Once | INTRAVENOUS | Status: AC
  Administered 2023-06-05: 200 mg via INTRAVENOUS
  Filled 2023-06-05: qty 8

## 2023-06-05 MED ORDER — LEUCOVORIN CALCIUM INJECTION 350 MG
400.0000 mg/m2 | Freq: Once | INTRAVENOUS | Status: AC
  Administered 2023-06-05: 496 mg via INTRAVENOUS
  Filled 2023-06-05: qty 24.8

## 2023-06-05 MED ORDER — DIPHENHYDRAMINE HCL 50 MG/ML IJ SOLN
25.0000 mg | Freq: Once | INTRAMUSCULAR | Status: AC
  Administered 2023-06-05: 25 mg via INTRAVENOUS
  Filled 2023-06-05: qty 1

## 2023-06-05 NOTE — Patient Instructions (Signed)
[place] [facility]  Discharge Instructions: Thank you for choosing [facility] to provide your oncology and hematology care.  If you have a lab appointment with the Cancer Center, please go directly to the Cancer Center and check in at the registration area.  Wear comfortable clothing and clothing appropriate for easy access to any Portacath or PICC line.   We strive to give you quality time with your provider. You may need to reschedule your appointment if you arrive late (15 or more minutes).  Arriving late affects you and other patients whose appointments are after yours.  Also, if you miss three or more appointments without notifying the office, you may be dismissed from the clinic at the provider's discretion.      For prescription refill requests, have your pharmacy contact our office and allow 72 hours for refills to be completed.    Today you received the following chemotherapy and/or immunotherapy agents avastin, oxaliplatin, 5FU, leucovorin      To help prevent nausea and vomiting after your treatment, we encourage you to take your nausea medication as directed.  BELOW ARE SYMPTOMS THAT SHOULD BE REPORTED IMMEDIATELY: *FEVER GREATER THAN 100.4 F (38 C) OR HIGHER *CHILLS OR SWEATING *NAUSEA AND VOMITING THAT IS NOT CONTROLLED WITH YOUR NAUSEA MEDICATION *UNUSUAL SHORTNESS OF BREATH *UNUSUAL BRUISING OR BLEEDING *URINARY PROBLEMS (pain or burning when urinating, or frequent urination) *BOWEL PROBLEMS (unusual diarrhea, constipation, pain near the anus) TENDERNESS IN MOUTH AND THROAT WITH OR WITHOUT PRESENCE OF ULCERS (sore throat, sores in mouth, or a toothache) UNUSUAL RASH, SWELLING OR PAIN  UNUSUAL VAGINAL DISCHARGE OR ITCHING   Items with * indicate a potential emergency and should be followed up as soon as possible or go to the Emergency Department if any problems should occur.  Please show the CHEMOTHERAPY ALERT CARD or  IMMUNOTHERAPY ALERT CARD at check-in to the Emergency Department and triage nurse.  Should you have questions after your visit or need to cancel or reschedule your appointment, please contact [place] [facility]  [phone] and follow the prompts.  Office hours are 8:00 a.m. to 4:30 p.m. Monday - Friday. Please note that voicemails left after 4:00 p.m. may not be returned until the following business day.  We are closed weekends and major holidays. You have access to a nurse at all times for urgent questions. Please call the main number to the clinic [phone] and follow the prompts.  For any non-urgent questions, you may also contact your provider using MyChart. We now offer e-Visits for anyone 45 and older to request care online for non-urgent symptoms. For details visit [email or web address].   Also download the MyChart app! Go to the app store, search "MyChart", open the app, select [place], and log in with your MyChart username and password.

## 2023-06-05 NOTE — Progress Notes (Signed)
Are we working out, or should I just plan to swim? [facility]  [phone] [phone]  ID: Veronica Boyd OB: [birth year]  MR#: [id number]  VHQ#:[id number]  Patient Care Team: Barbette Reichmann, MD as PCP - General (Internal Medicine) Jeralyn Ruths, MD as Consulting Physician (Oncology) Benita Gutter, RN as Oncology Nurse Navigator Salena Saner, MD as Consulting Physician (Pulmonary Disease)  CHIEF COMPLAINT: Recurrent stage IV rectal cancer, history of follicular lymphoma.  INTERVAL HISTORY: Patient returns to clinic today for further evaluation and consideration of cycle 5 of FOLFOX plus Avastin.  She currently feels well and is asymptomatic.  She continues to tolerate her treatments well without significant side effects.  She has good appetite and is maintaining weight. Her pain is well-controlled with tramadol.  She has a chronic peripheral neuropathy, but no other neurologic complaints.  She denies any recent fevers or illnesses.  She denies any chest pain, cough, shortness of breath, or hemoptysis.  She has a chronic cough. She denies any nausea, vomiting, constipation or diarrhea.  She has no urinary complaints.  Patient offers no specific complaints today.  REVIEW OF SYSTEMS:   Review of Systems  Constitutional: Negative.  Negative for fever, malaise/fatigue and weight loss.  Respiratory: Negative.  Negative for cough, hemoptysis and shortness of breath.   Cardiovascular: Negative.  Negative for chest pain and leg swelling.  Gastrointestinal:  Negative for abdominal pain, blood in stool, diarrhea, melena, nausea and vomiting.  Genitourinary: Negative.  Negative for dysuria.  Musculoskeletal: Negative.  Negative for back pain, joint pain and neck pain.  Skin: Negative.  Negative for rash.  Neurological:  Positive for tingling and sensory change. Negative for focal weakness, weakness and headaches.  Psychiatric/Behavioral:  Negative.  The patient is not nervous/anxious and does not have insomnia.     As per HPI. Otherwise, a complete review of systems is negative.   PAST MEDICAL HISTORY: Past Medical History:  Diagnosis Date   Anemia due to antineoplastic chemotherapy    Arthritis    Bronchial obstruction    Cataract    bilateral   Chemotherapy-induced neuropathy (HCC)    Chicken pox    Colon polyp    Dyspnea    Follicular lymphoma (HCC) 08/2016   lymph nodes    GERD (gastroesophageal reflux disease)    Hyperlipidemia    Lung nodule    Lung nodules 09/2022   right upper lobe   Mass of right chest wall    Osteoporosis    Protein-calorie malnutrition, severe (HCC)    Rectal carcinoma (HCC)    Thrombocytopenia (HCC)     PAST SURGICAL HISTORY: Past Surgical History:  Procedure Laterality Date   AXILLARY LYMPH NODE DISSECTION Right 08/21/2016   Procedure: AXILLARY LYMPH NODE excision;  Surgeon: Nadeen Landau, MD;  Location: [facility] ORS;  Service: General;  Laterality: Right;   CATARACT EXTRACTION, BILATERAL Bilateral    COLONOSCOPY N/A 09/19/2020   Procedure: COLONOSCOPY;  Surgeon: Regis Bill, MD;  Location: [facility] ENDOSCOPY;  Service: Endoscopy;  Laterality: N/A;   PORTA CATH INSERTION N/A 09/16/2017   Procedure: PORTA CATH INSERTION;  Surgeon: Annice Needy, MD;  Location: [facility] INVASIVE CV LAB;  Service: Cardiovascular;  Laterality: N/A;   TONSILLECTOMY     TOTAL HIP ARTHROPLASTY Left 1992    FAMILY HISTORY: Family History  Problem Relation Age of Onset   Heart failure Mother    Emphysema Father    Diabetes Sister  Lung cancer Brother    Diabetes Brother    Breast cancer Paternal Aunt    Basal cell carcinoma Daughter     ADVANCED DIRECTIVES (Y/N):  N  HEALTH MAINTENANCE: Social History   Tobacco Use   Smoking status: Never   Smokeless tobacco: Never  Vaping Use   Vaping status: Never Used  Substance Use Topics   Alcohol use: No   Drug use: No      Colonoscopy:  PAP:  Bone density:  Lipid panel:  No Known Allergies  Current Outpatient Medications  Medication Sig Dispense Refill   diclofenac sodium (VOLTAREN) 1 % GEL Apply 2 g topically 2 (two) times daily as needed.     diphenoxylate-atropine (LOMOTIL) 2.5-0.025 MG tablet TAKE 2 TABLETS BY MOUTH 4 (FOUR) TIMES DAILY AS NEEDED FOR DIARRHEA OR LOOSE STOOLS. 60 tablet 1   gabapentin (NEURONTIN) 300 MG capsule TAKE 1 CAPSULE BY MOUTH TWICE A DAY 60 capsule 2   lidocaine-prilocaine (EMLA) cream Apply 1 Application topically as needed. Apply to port 1 hour prior to use as needed 30 g 2   megestrol (MEGACE) 40 MG tablet Take 1 tablet (40 mg total) by mouth daily. 90 tablet 1   ondansetron (ZOFRAN) 8 MG tablet Take 1 tablet (8 mg total) by mouth every 8 (eight) hours as needed for nausea or vomiting. Start on the third day after chemotherapy. 60 tablet 2   pantoprazole (PROTONIX) 20 MG tablet Take 1 tablet by mouth daily as needed for heartburn.     Potassium 99 MG TABS Take 1 tablet by mouth as needed.     simvastatin (ZOCOR) 20 MG tablet Take 20 mg by mouth at bedtime.     tiZANidine (ZANAFLEX) 2 MG tablet TAKE 1 TABLET BY MOUTH NIGHTLY AS NEEDED FOR MUSCLE SPASMS. 90 tablet 1   traMADol-acetaminophen (ULTRACET) 37.5-325 MG tablet Take 1 tablet by mouth every 8 (eight) hours as needed.     No current facility-administered medications for this visit.   Facility-Administered Medications Ordered in Other Visits  Medication Dose Route Frequency Provider Last Rate Last Admin   0.9 %  sodium chloride infusion   Intravenous Once Orlie Dakin, Tollie Pizza, MD       bevacizumab-awwb (MVASI) 200 mg in sodium chloride 0.9 % 100 mL chemo infusion  5 mg/kg (Treatment Plan Recorded) Intravenous Once Jeralyn Ruths, MD       dexamethasone (DECADRON) 10 mg in sodium chloride 0.9 % 50 mL IVPB  10 mg Intravenous Once Jeralyn Ruths, MD       dextrose 5 % solution   Intravenous Once Jeralyn Ruths, MD       diphenhydrAMINE (BENADRYL) injection 25 mg  25 mg Intravenous Once Jeralyn Ruths, MD       famotidine (PEPCID) IVPB 20 mg premix  20 mg Intravenous Once Jeralyn Ruths, MD       fluorouracil (ADRUCIL) 3,000 mg in sodium chloride 0.9 % 90 mL chemo infusion  2,400 mg/m2 (Treatment Plan Recorded) Intravenous 1 day or 1 dose Jeralyn Ruths, MD       fluorouracil (ADRUCIL) chemo injection 500 mg  400 mg/m2 (Treatment Plan Recorded) Intravenous Once Jeralyn Ruths, MD       heparin lock flush 100 unit/mL  500 Units Intravenous Once Jeralyn Ruths, MD       heparin lock flush 100 unit/mL  500 Units Intracatheter PRN Jeralyn Ruths, MD       heparin  lock flush 100 unit/mL  500 Units Intravenous Once Jeralyn Ruths, MD       heparin lock flush 100 unit/mL  500 Units Intravenous Once Jeralyn Ruths, MD       leucovorin 496 mg in dextrose 5 % 250 mL infusion  400 mg/m2 (Treatment Plan Recorded) Intravenous Once Jeralyn Ruths, MD       oxaliplatin (ELOXATIN) 100 mg in dextrose 5 % 500 mL chemo infusion  85 mg/m2 (Treatment Plan Recorded) Intravenous Once Jeralyn Ruths, MD       palonosetron (ALOXI) injection 0.25 mg  0.25 mg Intravenous Once Jeralyn Ruths, MD       sodium chloride flush (NS) 0.9 % injection 10 mL  10 mL Intravenous PRN Jeralyn Ruths, MD   10 mL at 12/04/17 0901   sodium chloride flush (NS) 0.9 % injection 10 mL  10 mL Intracatheter PRN Jeralyn Ruths, MD       sodium chloride flush (NS) 0.9 % injection 10 mL  10 mL Intravenous PRN Jeralyn Ruths, MD   10 mL at 04/24/21 0839   sodium chloride flush (NS) 0.9 % injection 10 mL  10 mL Intracatheter PRN Jeralyn Ruths, MD   10 mL at 04/12/23 1419   yttrium-90 injection 22.3 millicurie  22.3 millicurie Intravenous Once Chrystal, Sherrine Maples, MD        OBJECTIVE: Vitals:   06/05/23 0857  BP: 125/80  Pulse: 97  Resp: 16  Temp: 98.2 F (36.8 C)  SpO2:  98%       Body mass index is 18.16 kg/m.    ECOG FS:0 - Asymptomatic  General: Well-developed, well-nourished, no acute distress. Eyes: Pink conjunctiva, anicteric sclera. HEENT: Normocephalic, moist mucous membranes. Lungs: No audible wheezing or coughing. Heart: Regular rate and rhythm. Abdomen: Soft, nontender, no obvious distention. Musculoskeletal: No edema, cyanosis, or clubbing. Neuro: Alert, answering all questions appropriately. Cranial nerves grossly intact. Skin: No rashes or petechiae noted. Psych: Normal affect.   LAB RESULTS:  Lab Results  Component Value Date   NA 139 06/05/2023   K 3.5 06/05/2023   CL 107 06/05/2023   CO2 23 06/05/2023   GLUCOSE 108 (H) 06/05/2023   BUN 9 06/05/2023   CREATININE 0.76 06/05/2023   CALCIUM 9.0 06/05/2023   PROT 6.5 06/05/2023   ALBUMIN 3.6 06/05/2023   AST 23 06/05/2023   ALT 15 06/05/2023   ALKPHOS 89 06/05/2023   BILITOT 0.4 06/05/2023   GFRNONAA >60 06/05/2023   GFRAA >60 06/28/2020    Lab Results  Component Value Date   WBC 9.8 06/05/2023   NEUTROABS 6.0 06/05/2023   HGB 12.0 06/05/2023   HCT 37.7 06/05/2023   MCV 100.5 (H) 06/05/2023   PLT 122 (L) 06/05/2023     STUDIES: No results found.  ONCOLOGY HISTORY:  Biopsy confirmed rectal cancer.  MRI completed at [facility] on October 11, 2020 revealed extension of the tumor beyond the wall into the rectovaginal recess with suspected invasion of the vaginal wall posteriorly.  Tumor also appears to involve the internal anal sphincter.  There are also numerous prominent presacral and mesorectal lymph nodes highly suspicious for malignancy.  PET scan results from November 17, 2020 reviewed independently with 3 hypermetabolic right lower lobe lung nodules consistent with pulmonary metastasis.  Patient completed cycle 8 of FOLFOX on Mar 29, 2021.  She then completed cycle 5 of her 5-FU pump on May 26, 2021 and XRT on June 01, 2021.  PET scan results from July 17, 2021 reviewed independently with no evidence of disease other than enlarging right lower lobe lung lesion.  Patient completed SBRT to the right lung lesion on August 22, 2021.  Repeat PET scan on July 25, 2021 revealed minimal residual disease in the rectum and likely resolution of lesions in the lung.    ASSESSMENT: Recurrent stage IV rectal cancer, history of follicular lymphoma.  PLAN:    Recurrent stage IV rectal cancer: See oncology history as above.  PET scan results from Mar 21, 2023 reviewed independently with clear progression of disease in patient's pelvis as well as multiple pulmonary nodules.  Her CEA initially increased to 29.8, but now has trended down to 13.2.  Today's result was pending.  Hospice and end-of-life care were previously discussed, but patient preferred to pursue treatment.  Proceed with cycle 5 of FOLFOX plus Avastin today.  Return to clinic in 2 days for pump removal, IV fluids, and Udenyca.  Patient then return to clinic in 2 weeks for further evaluation and consideration of cycle 6.  Plan to reimage after cycle 6.   Recurrent follicular lymphoma: CT scan results from December 23, 2019 reviewed independently with no obvious evidence of recurrent or progressive disease.  PET scan results as above consistent with rectal cancer metastasis and no evidence of lymphoma.   Patient received Zevalin on July 30, 2017 with not much therapeutic effect.  She subsequently underwent cycles 5 of R-CHOP chemotherapy with Neulasta support completing on December 12, 2017.  Patient continues to be in complete remission.  Neuropathy: Chronic and unchanged.   Pelvic pain: Patient does not complain of this today.  Continue tramadol as prescribed.   Poor appetite/weight loss: Resolved.  Continue Megace as prescribed. Neutropenia: Resolved.  Continue Udenyca with pump removal. Anemia: Resolved. Thrombocytopenia: Chronic and unchanged.  Patient's platelet count is 122.    Patient  expressed understanding and was in agreement with this plan. She also understands that She can call clinic at any time with any questions, concerns, or complaints.    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

## 2023-06-07 ENCOUNTER — Ambulatory Visit: Payer: 59

## 2023-06-07 ENCOUNTER — Inpatient Hospital Stay: Payer: 59 | Attending: Oncology

## 2023-06-07 VITALS — BP 124/65 | HR 96 | Temp 99.1°F | Resp 16

## 2023-06-07 DIAGNOSIS — C8290 Follicular lymphoma, unspecified, unspecified site: Secondary | ICD-10-CM | POA: Diagnosis not present

## 2023-06-07 DIAGNOSIS — Z5189 Encounter for other specified aftercare: Secondary | ICD-10-CM | POA: Insufficient documentation

## 2023-06-07 DIAGNOSIS — Z79899 Other long term (current) drug therapy: Secondary | ICD-10-CM | POA: Insufficient documentation

## 2023-06-07 DIAGNOSIS — G629 Polyneuropathy, unspecified: Secondary | ICD-10-CM | POA: Insufficient documentation

## 2023-06-07 DIAGNOSIS — D696 Thrombocytopenia, unspecified: Secondary | ICD-10-CM | POA: Insufficient documentation

## 2023-06-07 DIAGNOSIS — C20 Malignant neoplasm of rectum: Secondary | ICD-10-CM | POA: Diagnosis present

## 2023-06-07 DIAGNOSIS — E876 Hypokalemia: Secondary | ICD-10-CM | POA: Diagnosis not present

## 2023-06-07 DIAGNOSIS — C7801 Secondary malignant neoplasm of right lung: Secondary | ICD-10-CM | POA: Insufficient documentation

## 2023-06-07 DIAGNOSIS — Z5111 Encounter for antineoplastic chemotherapy: Secondary | ICD-10-CM | POA: Diagnosis present

## 2023-06-07 MED ORDER — PEGFILGRASTIM-CBQV 6 MG/0.6ML ~~LOC~~ SOSY
6.0000 mg | PREFILLED_SYRINGE | Freq: Once | SUBCUTANEOUS | Status: AC
  Administered 2023-06-07: 6 mg via SUBCUTANEOUS
  Filled 2023-06-07: qty 0.6

## 2023-06-07 MED ORDER — SODIUM CHLORIDE 0.9% FLUSH
10.0000 mL | INTRAVENOUS | Status: DC | PRN
  Filled 2023-06-07: qty 10

## 2023-06-07 MED ORDER — HEPARIN SOD (PORK) LOCK FLUSH 100 UNIT/ML IV SOLN
500.0000 [IU] | Freq: Once | INTRAVENOUS | Status: AC | PRN
  Administered 2023-06-07: 500 [IU]
  Filled 2023-06-07: qty 5

## 2023-06-07 MED ORDER — SODIUM CHLORIDE 0.9 % IV SOLN
Freq: Once | INTRAVENOUS | Status: AC
  Filled 2023-06-07: qty 250

## 2023-06-10 ENCOUNTER — Telehealth: Payer: Self-pay | Admitting: Cardiovascular Disease

## 2023-06-10 ENCOUNTER — Other Ambulatory Visit: Payer: Self-pay

## 2023-06-10 DIAGNOSIS — I480 Paroxysmal atrial fibrillation: Secondary | ICD-10-CM

## 2023-06-10 MED ORDER — APIXABAN 5 MG PO TABS
5.0000 mg | ORAL_TABLET | Freq: Two times a day (BID) | ORAL | 3 refills | Status: DC
Start: 2023-06-10 — End: 2023-12-09

## 2023-06-10 MED ORDER — DIGOXIN 125 MCG PO TABS
0.1250 mg | ORAL_TABLET | Freq: Every day | ORAL | 3 refills | Status: DC
Start: 2023-06-10 — End: 2023-07-18

## 2023-06-10 NOTE — Telephone Encounter (Signed)
Received Zio monitor results through fax  Printed and given to Triage RN Nigel/ Raynelle Fanning

## 2023-06-12 ENCOUNTER — Other Ambulatory Visit: Payer: Self-pay

## 2023-06-18 MED FILL — Dexamethasone Sodium Phosphate Inj 100 MG/10ML: INTRAMUSCULAR | Qty: 1 | Status: AC

## 2023-06-19 ENCOUNTER — Inpatient Hospital Stay: Payer: 59

## 2023-06-19 ENCOUNTER — Inpatient Hospital Stay (HOSPITAL_BASED_OUTPATIENT_CLINIC_OR_DEPARTMENT_OTHER): Payer: 59 | Admitting: Oncology

## 2023-06-19 ENCOUNTER — Encounter: Payer: Self-pay | Admitting: Oncology

## 2023-06-19 VITALS — BP 95/60 | HR 120 | Temp 96.0°F | Resp 16 | Wt 84.6 lb

## 2023-06-19 DIAGNOSIS — C20 Malignant neoplasm of rectum: Secondary | ICD-10-CM

## 2023-06-19 DIAGNOSIS — Z5111 Encounter for antineoplastic chemotherapy: Secondary | ICD-10-CM | POA: Diagnosis not present

## 2023-06-19 LAB — CMP (CANCER CENTER ONLY)
ALT: 14 U/L (ref 0–44)
AST: 24 U/L (ref 15–41)
Albumin: 3.8 g/dL (ref 3.5–5.0)
Alkaline Phosphatase: 94 U/L (ref 38–126)
Anion gap: 10 (ref 5–15)
BUN: 10 mg/dL (ref 8–23)
CO2: 22 mmol/L (ref 22–32)
Calcium: 8.5 mg/dL — ABNORMAL LOW (ref 8.9–10.3)
Chloride: 106 mmol/L (ref 98–111)
Creatinine: 0.74 mg/dL (ref 0.44–1.00)
GFR, Estimated: >60 mL/min (ref >60)
Glucose, Bld: 110 mg/dL — ABNORMAL HIGH (ref 70–99)
Potassium: 3.1 mmol/L — ABNORMAL LOW (ref 3.5–5.1)
Sodium: 138 mmol/L (ref 135–145)
Total Bilirubin: 0.5 mg/dL (ref 0.3–1.2)
Total Protein: 6.6 g/dL (ref 6.5–8.1)

## 2023-06-19 LAB — CBC WITH DIFFERENTIAL (CANCER CENTER ONLY)
Abs Immature Granulocytes: 0.1 10*3/uL — ABNORMAL HIGH (ref 0.00–0.07)
Basophils Absolute: 0.1 10*3/uL (ref 0.0–0.1)
Basophils Relative: 1 %
Eosinophils Absolute: 0.1 10*3/uL (ref 0.0–0.5)
Eosinophils Relative: 1 %
HCT: 40.6 % (ref 36.0–46.0)
Hemoglobin: 12.7 g/dL (ref 12.0–15.0)
Immature Granulocytes: 1 %
Lymphocytes Relative: 18 %
Lymphs Abs: 2.1 10*3/uL (ref 0.7–4.0)
MCH: 31.8 pg (ref 26.0–34.0)
MCHC: 31.3 g/dL (ref 30.0–36.0)
MCV: 101.8 fL — ABNORMAL HIGH (ref 80.0–100.0)
Monocytes Absolute: 1.7 10*3/uL — ABNORMAL HIGH (ref 0.1–1.0)
Monocytes Relative: 15 %
Neutro Abs: 7.4 10*3/uL (ref 1.7–7.7)
Neutrophils Relative %: 64 %
Platelet Count: 82 10*3/uL — ABNORMAL LOW (ref 150–400)
RBC: 3.99 MIL/uL (ref 3.87–5.11)
RDW: 18 % — ABNORMAL HIGH (ref 11.5–15.5)
WBC Count: 11.4 10*3/uL — ABNORMAL HIGH (ref 4.0–10.5)
nRBC: 0 % (ref 0.0–0.2)

## 2023-06-19 LAB — PROTEIN, URINE, RANDOM: Total Protein, Urine: 221 mg/dL

## 2023-06-19 MED ORDER — HEPARIN SOD (PORK) LOCK FLUSH 100 UNIT/ML IV SOLN
500.0000 [IU] | Freq: Once | INTRAVENOUS | Status: AC
  Administered 2023-06-19: 500 [IU] via INTRAVENOUS
  Filled 2023-06-19: qty 5

## 2023-06-19 NOTE — Progress Notes (Addendum)
Nutrition  RD planning on seeing patient during infusion but no treatment today.    Will follow-up as able.   B. Freida Busman, RD, LDN Registered Dietitian [phone]

## 2023-06-19 NOTE — Progress Notes (Signed)
Are we working out, or should I just plan to swim? [facility]  [phone] [phone]  ID: Veronica Boyd OB: [birth year]  MR#: [id number]  AOZ#:[id number]  Patient Care Team: Barbette Reichmann, MD as PCP - General (Internal Medicine) Jeralyn Ruths, MD as Consulting Physician (Oncology) Benita Gutter, RN as Oncology Nurse Navigator Salena Saner, MD as Consulting Physician (Pulmonary Disease)  CHIEF COMPLAINT: Recurrent stage IV rectal cancer, history of follicular lymphoma.  INTERVAL HISTORY: Patient returns to clinic today for further evaluation and consideration of cycle 6 of FOLFOX plus Avastin.  She recently started some new medications through cardiology and since that time has had increased fatigue as well as cough.  Her daughter reports she had COVID, but patient apparently tested negative.  She continues to tolerate her treatments well without significant side effects.  She has good appetite and is maintaining weight. Her pain is well-controlled with tramadol.  She has a chronic peripheral neuropathy, but no other neurologic complaints.  She denies any recent fevers or illnesses.  She denies any chest pain, shortness of breath, or hemoptysis.  She has a chronic cough. She denies any nausea, vomiting, constipation or diarrhea.  She has no urinary complaints.  Patient offers no further specific complaints today.  REVIEW OF SYSTEMS:   Review of Systems  Constitutional:  Positive for malaise/fatigue. Negative for fever and weight loss.  Respiratory:  Positive for cough. Negative for hemoptysis and shortness of breath.   Cardiovascular: Negative.  Negative for chest pain and leg swelling.  Gastrointestinal:  Negative for abdominal pain, blood in stool, diarrhea, melena, nausea and vomiting.  Genitourinary: Negative.  Negative for dysuria.  Musculoskeletal: Negative.  Negative for back pain, joint pain and neck pain.  Skin: Negative.   Negative for rash.  Neurological:  Positive for tingling and sensory change. Negative for focal weakness, weakness and headaches.  Psychiatric/Behavioral: Negative.  The patient is not nervous/anxious and does not have insomnia.     As per HPI. Otherwise, a complete review of systems is negative.   PAST MEDICAL HISTORY: Past Medical History:  Diagnosis Date   Anemia due to antineoplastic chemotherapy    Arthritis    Bronchial obstruction    Cataract    bilateral   Chemotherapy-induced neuropathy (HCC)    Chicken pox    Colon polyp    Dyspnea    Follicular lymphoma (HCC) 08/2016   lymph nodes    GERD (gastroesophageal reflux disease)    Hyperlipidemia    Lung nodule    Lung nodules 09/2022   right upper lobe   Mass of right chest wall    Osteoporosis    Protein-calorie malnutrition, severe (HCC)    Rectal carcinoma (HCC)    Thrombocytopenia (HCC)     PAST SURGICAL HISTORY: Past Surgical History:  Procedure Laterality Date   AXILLARY LYMPH NODE DISSECTION Right 08/21/2016   Procedure: AXILLARY LYMPH NODE excision;  Surgeon: Nadeen Landau, MD;  Location: [facility] ORS;  Service: General;  Laterality: Right;   CATARACT EXTRACTION, BILATERAL Bilateral    COLONOSCOPY N/A 09/19/2020   Procedure: COLONOSCOPY;  Surgeon: Regis Bill, MD;  Location: [facility] ENDOSCOPY;  Service: Endoscopy;  Laterality: N/A;   PORTA CATH INSERTION N/A 09/16/2017   Procedure: PORTA CATH INSERTION;  Surgeon: Annice Needy, MD;  Location: [facility] INVASIVE CV LAB;  Service: Cardiovascular;  Laterality: N/A;   TONSILLECTOMY     TOTAL HIP ARTHROPLASTY Left 1992    FAMILY  HISTORY: Family History  Problem Relation Age of Onset   Heart failure Mother    Emphysema Father    Diabetes Sister    Lung cancer Brother    Diabetes Brother    Breast cancer Paternal Aunt    Basal cell carcinoma Daughter     ADVANCED DIRECTIVES (Y/N):  N  HEALTH MAINTENANCE: Social History   Tobacco Use   Smoking  status: Never   Smokeless tobacco: Never  Vaping Use   Vaping status: Never Used  Substance Use Topics   Alcohol use: No   Drug use: No     Colonoscopy:  PAP:  Bone density:  Lipid panel:  No Known Allergies  Current Outpatient Medications  Medication Sig Dispense Refill   diclofenac sodium (VOLTAREN) 1 % GEL Apply 2 g topically 2 (two) times daily as needed.     diphenoxylate-atropine (LOMOTIL) 2.5-0.025 MG tablet TAKE 2 TABLETS BY MOUTH 4 (FOUR) TIMES DAILY AS NEEDED FOR DIARRHEA OR LOOSE STOOLS. 60 tablet 1   gabapentin (NEURONTIN) 300 MG capsule TAKE 1 CAPSULE BY MOUTH TWICE A DAY 60 capsule 2   lidocaine-prilocaine (EMLA) cream Apply 1 Application topically as needed. Apply to port 1 hour prior to use as needed 30 g 2   megestrol (MEGACE) 40 MG tablet Take 1 tablet (40 mg total) by mouth daily. 90 tablet 1   ondansetron (ZOFRAN) 8 MG tablet Take 1 tablet (8 mg total) by mouth every 8 (eight) hours as needed for nausea or vomiting. Start on the third day after chemotherapy. 60 tablet 2   pantoprazole (PROTONIX) 20 MG tablet Take 1 tablet by mouth daily as needed for heartburn.     Potassium 99 MG TABS Take 1 tablet by mouth as needed.     simvastatin (ZOCOR) 20 MG tablet Take 20 mg by mouth at bedtime.     tiZANidine (ZANAFLEX) 2 MG tablet TAKE 1 TABLET BY MOUTH NIGHTLY AS NEEDED FOR MUSCLE SPASMS. 90 tablet 1   traMADol-acetaminophen (ULTRACET) 37.5-325 MG tablet Take 1 tablet by mouth every 8 (eight) hours as needed.     apixaban (ELIQUIS) 5 MG TABS tablet Take 1 tablet (5 mg total) by mouth 2 (two) times daily. (Patient not taking: Reported on 06/19/2023) 180 tablet 3   digoxin (LANOXIN) 0.125 MG tablet Take 1 tablet (0.125 mg total) by mouth daily. (Patient not taking: Reported on 06/19/2023) 90 tablet 3   No current facility-administered medications for this visit.   Facility-Administered Medications Ordered in Other Visits  Medication Dose Route Frequency Provider Last Rate  Last Admin   heparin lock flush 100 unit/mL  500 Units Intravenous Once Orlie Dakin, Tollie Pizza, MD       heparin lock flush 100 unit/mL  500 Units Intracatheter PRN Orlie Dakin, Tollie Pizza, MD       heparin lock flush 100 unit/mL  500 Units Intravenous Once Jeralyn Ruths, MD       heparin lock flush 100 unit/mL  500 Units Intravenous Once Jeralyn Ruths, MD       sodium chloride flush (NS) 0.9 % injection 10 mL  10 mL Intravenous PRN Jeralyn Ruths, MD   10 mL at 12/04/17 0901   sodium chloride flush (NS) 0.9 % injection 10 mL  10 mL Intracatheter PRN Jeralyn Ruths, MD       sodium chloride flush (NS) 0.9 % injection 10 mL  10 mL Intravenous PRN Jeralyn Ruths, MD   10 mL at 04/24/21 [phone]  sodium chloride flush (NS) 0.9 % injection 10 mL  10 mL Intracatheter PRN Jeralyn Ruths, MD   10 mL at 04/12/23 1419   yttrium-90 injection 22.3 millicurie  22.3 millicurie Intravenous Once Carmina Miller, MD        OBJECTIVE: Vitals:   06/19/23 0904  BP: 95/60  Pulse: (!) 120  Resp: 16  Temp: (!) 96 F (35.6 C)  SpO2: 98%        Body mass index is 17.68 kg/m.    ECOG FS:0 - Asymptomatic  General: Well-developed, well-nourished, no acute distress. Eyes: Pink conjunctiva, anicteric sclera. HEENT: Normocephalic, moist mucous membranes. Lungs: No audible wheezing or coughing. Heart: Regular rate and rhythm. Abdomen: Soft, nontender, no obvious distention. Musculoskeletal: No edema, cyanosis, or clubbing. Neuro: Alert, answering all questions appropriately. Cranial nerves grossly intact. Skin: No rashes or petechiae noted. Psych: Normal affect.  LAB RESULTS:  Lab Results  Component Value Date   NA 138 06/19/2023   K 3.1 (L) 06/19/2023   CL 106 06/19/2023   CO2 22 06/19/2023   GLUCOSE 110 (H) 06/19/2023   BUN 10 06/19/2023   CREATININE 0.74 06/19/2023   CALCIUM 8.5 (L) 06/19/2023   PROT 6.6 06/19/2023   ALBUMIN 3.8 06/19/2023   AST 24 06/19/2023   ALT 14  06/19/2023   ALKPHOS 94 06/19/2023   BILITOT 0.5 06/19/2023   GFRNONAA >60 06/19/2023   GFRAA >60 06/28/2020    Lab Results  Component Value Date   WBC 11.4 (H) 06/19/2023   NEUTROABS 7.4 06/19/2023   HGB 12.7 06/19/2023   HCT 40.6 06/19/2023   MCV 101.8 (H) 06/19/2023   PLT 82 (L) 06/19/2023     STUDIES: LONG TERM MONITOR (3-14 DAYS)  Result Date: 06/17/2023 Event monitor Patch Wear Time:  18 days and 9 hours (2024-06-30T11:27:02-399 to 2024-07-28T19:31:35-0400) Monitor 1 Normal sinus rhythm Patient had a min HR of 50 bpm, max HR of 226 bpm, and avg HR of 102 bpm. Atrial Flutter occurred (16% burden), ranging from 78-226 bpm (avg of 126 bpm), the longest lasting 31 mins 49 secs with an avg rate of 168 bpm.  Possible atrial tachycardia Isolated SVEs were frequent (7.0%, 42231), SVE Couplets were rare (<1.0%, 1946), and SVE Triplets were rare (<1.0%, 824). Isolated VEs were rare (<1.0%), VE Couplets were rare (<1.0%), and no VE Triplets were present. Monitor 2 Normal sinus rhythm Patient had a min HR of 48 bpm, max HR of 235 bpm, and avg HR of 100 bpm. 1 run of Ventricular Tachycardia occurred lasting 4 beats with a max rate of 226 bpm (avg 202 bpm). Atrial Fibrillation/Flutter occurred (15% burden), ranging from 66-235 bpm (avg of 115 bpm), the longest lasting 22 mins 56 secs with an avg rate of 102 bpm.  Possible atrial tachycardia. Isolated SVEs were frequent (9.0%, [id number]), SVE Couplets were occasional (1.4%, 14036), and SVE Triplets were rare (<1.0%, 6711). Isolated VEs were rare (<1.0%), VE Couplets were rare (<1.0%), and no VE Triplets were present. Signed, Dossie Arbour, MD, Ph.D [facility]    ONCOLOGY HISTORY:  Biopsy confirmed rectal cancer.  [facility] completed at [facility] on October 11, 2020 revealed extension of the tumor beyond the wall into the rectovaginal recess with suspected invasion of the vaginal wall posteriorly.  Tumor also appears to involve the internal anal  sphincter.  There are also numerous prominent presacral and mesorectal lymph nodes highly suspicious for malignancy.  PET scan results from November 17, 2020 reviewed independently with 3 hypermetabolic  right lower lobe lung nodules consistent with pulmonary metastasis.  Patient completed cycle 8 of FOLFOX on Mar 29, 2021.  She then completed cycle 5 of her 5-FU pump on May 26, 2021 and XRT on June 01, 2021.  PET scan results from July 17, 2021 reviewed independently with no evidence of disease other than enlarging right lower lobe lung lesion.  Patient completed SBRT to the right lung lesion on August 22, 2021.  Repeat PET scan on July 25, 2021 revealed minimal residual disease in the rectum and likely resolution of lesions in the lung.    ASSESSMENT: Recurrent stage IV rectal cancer, history of follicular lymphoma.  PLAN:    Recurrent stage IV rectal cancer: See oncology history as above.  PET scan results from Mar 21, 2023 reviewed independently with clear progression of disease in patient's pelvis as well as multiple pulmonary nodules.  Her CEA initially increased to 29.8, but now has trended down to 13.2.  Hospice and end-of-life care were previously discussed, but patient preferred to pursue treatment.  Delay cycle 6 of FOLFOX plus Avastin today secondary to thrombocytopenia.  Return to clinic in 1 week for further evaluation and reconsideration of cycle 6. Plan to reimage after cycle 6.   Recurrent follicular lymphoma: CT scan results from December 23, 2019 reviewed independently with no obvious evidence of recurrent or progressive disease.  PET scan results as above consistent with rectal cancer metastasis and no evidence of lymphoma.   Patient received Zevalin on July 30, 2017 with not much therapeutic effect.  She subsequently underwent cycles 5 of R-CHOP chemotherapy with Neulasta support completing on December 12, 2017.  Patient continues to be in complete remission.  Neuropathy:  Chronic and unchanged.   Pelvic pain: Patient does not complain of this today.  Continue tramadol as prescribed.   Poor appetite/weight loss: Resolved.  Continue Megace as prescribed. Neutropenia: Resolved.  Continue Udenyca with pump removal. Anemia: Resolved. Thrombocytopenia: Platelet count has dropped to 82.  Delay treatment 1 week as above. Hypokalemia: Patient plans to reinitiate her oral potassium supplementation.  Patient expressed understanding and was in agreement with this plan. She also understands that She can call clinic at any time with any questions, concerns, or complaints.    Cancer Staging  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 [facility]) Staging form: Lymphoid Neoplasms, AJCC 6th Edition - Clinical stage from 08/27/2016: Stage IIE - Signed by Jeralyn Ruths, MD on 08/27/2016  Rectal cancer [facility]) Staging form: Colon and Rectum, AJCC 8th Edition - Clinical: Stage IVA Dewaine Oats, [place]) - Signed by Jeralyn Ruths, MD on 11/02/2020   Jeralyn Ruths, MD   06/19/2023 2:48 PM

## 2023-06-19 NOTE — Progress Notes (Signed)
Pt reports has a cold and cough.  States cough started after starting eliquis and digoxin.  Pt reports she stopped taking meds.

## 2023-06-20 ENCOUNTER — Encounter: Payer: Self-pay | Admitting: Oncology

## 2023-06-21 ENCOUNTER — Ambulatory Visit: Payer: 59

## 2023-06-21 ENCOUNTER — Inpatient Hospital Stay: Payer: 59

## 2023-06-25 MED FILL — Dexamethasone Sodium Phosphate Inj 100 MG/10ML: INTRAMUSCULAR | Qty: 1 | Status: AC

## 2023-06-26 ENCOUNTER — Inpatient Hospital Stay: Payer: 59

## 2023-06-26 ENCOUNTER — Inpatient Hospital Stay (HOSPITAL_BASED_OUTPATIENT_CLINIC_OR_DEPARTMENT_OTHER): Payer: 59 | Admitting: Oncology

## 2023-06-26 DIAGNOSIS — C20 Malignant neoplasm of rectum: Secondary | ICD-10-CM | POA: Diagnosis not present

## 2023-06-26 DIAGNOSIS — Z5111 Encounter for antineoplastic chemotherapy: Secondary | ICD-10-CM | POA: Diagnosis not present

## 2023-06-26 LAB — CBC WITH DIFFERENTIAL (CANCER CENTER ONLY)
Abs Immature Granulocytes: 0.02 10*3/uL (ref 0.00–0.07)
Basophils Absolute: 0 10*3/uL (ref 0.0–0.1)
Basophils Relative: 0 %
Eosinophils Absolute: 0.1 10*3/uL (ref 0.0–0.5)
Eosinophils Relative: 1 %
HCT: 38.5 % (ref 36.0–46.0)
Hemoglobin: 12 g/dL (ref 12.0–15.0)
Immature Granulocytes: 0 %
Lymphocytes Relative: 26 %
Lymphs Abs: 1.8 10*3/uL (ref 0.7–4.0)
MCH: 32.4 pg (ref 26.0–34.0)
MCHC: 31.2 g/dL (ref 30.0–36.0)
MCV: 104.1 fL — ABNORMAL HIGH (ref 80.0–100.0)
Monocytes Absolute: 1.1 10*3/uL — ABNORMAL HIGH (ref 0.1–1.0)
Monocytes Relative: 16 %
Neutro Abs: 3.9 10*3/uL (ref 1.7–7.7)
Neutrophils Relative %: 57 %
Platelet Count: 174 10*3/uL (ref 150–400)
RBC: 3.7 MIL/uL — ABNORMAL LOW (ref 3.87–5.11)
RDW: 17.4 % — ABNORMAL HIGH (ref 11.5–15.5)
WBC Count: 6.9 10*3/uL (ref 4.0–10.5)
nRBC: 0 % (ref 0.0–0.2)

## 2023-06-26 LAB — CMP (CANCER CENTER ONLY)
ALT: 11 U/L (ref 0–44)
AST: 24 U/L (ref 15–41)
Albumin: 3.5 g/dL (ref 3.5–5.0)
Alkaline Phosphatase: 70 U/L (ref 38–126)
Anion gap: 8 (ref 5–15)
BUN: 12 mg/dL (ref 8–23)
CO2: 24 mmol/L (ref 22–32)
Calcium: 8.9 mg/dL (ref 8.9–10.3)
Chloride: 107 mmol/L (ref 98–111)
Creatinine: 0.78 mg/dL (ref 0.44–1.00)
GFR, Estimated: >60 mL/min (ref >60)
Glucose, Bld: 109 mg/dL — ABNORMAL HIGH (ref 70–99)
Potassium: 3.4 mmol/L — ABNORMAL LOW (ref 3.5–5.1)
Sodium: 139 mmol/L (ref 135–145)
Total Bilirubin: 0.3 mg/dL (ref 0.3–1.2)
Total Protein: 6.4 g/dL — ABNORMAL LOW (ref 6.5–8.1)

## 2023-06-26 LAB — PROTEIN, URINE, RANDOM: Total Protein, Urine: 13 mg/dL

## 2023-06-26 MED ORDER — DIPHENHYDRAMINE HCL 50 MG/ML IJ SOLN
25.0000 mg | Freq: Once | INTRAMUSCULAR | Status: AC
  Administered 2023-06-26: 25 mg via INTRAVENOUS
  Filled 2023-06-26: qty 1

## 2023-06-26 MED ORDER — DEXTROSE 5 % IV SOLN
Freq: Once | INTRAVENOUS | Status: AC
  Filled 2023-06-26: qty 250

## 2023-06-26 MED ORDER — SODIUM CHLORIDE 0.9 % IV SOLN
10.0000 mg | Freq: Once | INTRAVENOUS | Status: AC
  Administered 2023-06-26: 10 mg via INTRAVENOUS
  Filled 2023-06-26: qty 10
  Filled 2023-06-26: qty 1

## 2023-06-26 MED ORDER — FLUOROURACIL CHEMO INJECTION 500 MG/10ML
400.0000 mg/m2 | Freq: Once | INTRAVENOUS | Status: AC
  Administered 2023-06-26: 500 mg via INTRAVENOUS
  Filled 2023-06-26: qty 10

## 2023-06-26 MED ORDER — SODIUM CHLORIDE 0.9 % IV SOLN
2400.0000 mg/m2 | INTRAVENOUS | Status: DC
  Administered 2023-06-26: 3000 mg via INTRAVENOUS
  Filled 2023-06-26: qty 60

## 2023-06-26 MED ORDER — OXALIPLATIN CHEMO INJECTION 100 MG/20ML
85.0000 mg/m2 | Freq: Once | INTRAVENOUS | Status: AC
  Administered 2023-06-26: 100 mg via INTRAVENOUS
  Filled 2023-06-26: qty 20

## 2023-06-26 MED ORDER — LEUCOVORIN CALCIUM INJECTION 350 MG
400.0000 mg/m2 | Freq: Once | INTRAVENOUS | Status: AC
  Administered 2023-06-26: 496 mg via INTRAVENOUS
  Filled 2023-06-26: qty 24.8

## 2023-06-26 MED ORDER — SODIUM CHLORIDE 0.9 % IV SOLN
5.0000 mg/kg | Freq: Once | INTRAVENOUS | Status: AC
  Administered 2023-06-26: 200 mg via INTRAVENOUS
  Filled 2023-06-26: qty 8

## 2023-06-26 MED ORDER — SODIUM CHLORIDE 0.9 % IV SOLN
Freq: Once | INTRAVENOUS | Status: AC
  Filled 2023-06-26: qty 250

## 2023-06-26 MED ORDER — FAMOTIDINE IN NACL 20-0.9 MG/50ML-% IV SOLN
20.0000 mg | Freq: Once | INTRAVENOUS | Status: AC
  Administered 2023-06-26: 20 mg via INTRAVENOUS
  Filled 2023-06-26: qty 50

## 2023-06-26 MED ORDER — PALONOSETRON HCL INJECTION 0.25 MG/5ML
0.2500 mg | Freq: Once | INTRAVENOUS | Status: AC
  Administered 2023-06-26: 0.25 mg via INTRAVENOUS
  Filled 2023-06-26: qty 5

## 2023-06-26 NOTE — Progress Notes (Signed)
Patient says that she is doing well. No new questions or concerns for Dr. Irving Copas today.

## 2023-06-26 NOTE — Patient Instructions (Signed)
[facility]  Discharge Instructions: Thank you for choosing [facility] to provide your oncology and hematology care.  If you have a lab appointment with the Cancer Center, please go directly to the Cancer Center and check in at the registration area.  Wear comfortable clothing and clothing appropriate for easy access to any Portacath or PICC line.   We strive to give you quality time with your provider. You may need to reschedule your appointment if you arrive late (15 or more minutes).  Arriving late affects you and other patients whose appointments are after yours.  Also, if you miss three or more appointments without notifying the office, you may be dismissed from the clinic at the provider's discretion.      For prescription refill requests, have your pharmacy contact our office and allow 72 hours for refills to be completed.    Today you received the following chemotherapy and/or immunotherapy agents MVASI, Oxaliplatin, Leucovorin & Adrucil      To help prevent nausea and vomiting after your treatment, we encourage you to take your nausea medication as directed.  BELOW ARE SYMPTOMS THAT SHOULD BE REPORTED IMMEDIATELY: *FEVER GREATER THAN 100.4 F (38 C) OR HIGHER *CHILLS OR SWEATING *NAUSEA AND VOMITING THAT IS NOT CONTROLLED WITH YOUR NAUSEA MEDICATION *UNUSUAL SHORTNESS OF BREATH *UNUSUAL BRUISING OR BLEEDING *URINARY PROBLEMS (pain or burning when urinating, or frequent urination) *BOWEL PROBLEMS (unusual diarrhea, constipation, pain near the anus) TENDERNESS IN MOUTH AND THROAT WITH OR WITHOUT PRESENCE OF ULCERS (sore throat, sores in mouth, or a toothache) UNUSUAL RASH, SWELLING OR PAIN  UNUSUAL VAGINAL DISCHARGE OR ITCHING   Items with * indicate a potential emergency and should be followed up as soon as possible or go to the Emergency Department if any problems should occur.  Please show the CHEMOTHERAPY ALERT CARD or  IMMUNOTHERAPY ALERT CARD at check-in to the Emergency Department and triage nurse.  Should you have questions after your visit or need to cancel or reschedule your appointment, please contact [place] [facility]  [phone] and follow the prompts.  Office hours are 8:00 a.m. to 4:30 p.m. Monday - Friday. Please note that voicemails left after 4:00 p.m. may not be returned until the following business day.  We are closed weekends and major holidays. You have access to a nurse at all times for urgent questions. Please call the main number to the clinic [phone] and follow the prompts.  For any non-urgent questions, you may also contact your provider using MyChart. We now offer e-Visits for anyone 38 and older to request care online for non-urgent symptoms. For details visit [email or web address].   Also download the MyChart app! Go to the app store, search "MyChart", open the app, select [name], and log in with your MyChart username and password.

## 2023-06-26 NOTE — Progress Notes (Addendum)
[facility]  [phone] [phone]  ID: Veronica Boyd OB: 11-16-1945  MR#: [id number]  VHQ#:[id number]  Patient Care Team: Barbette Reichmann, MD as PCP - General (Internal Medicine) Jeralyn Ruths, MD as Consulting Physician (Oncology) Benita Gutter, RN as Oncology Nurse Navigator Salena Saner, MD as Consulting Physician (Pulmonary Disease)  CHIEF COMPLAINT: Recurrent stage IV rectal cancer, history of follicular lymphoma.  INTERVAL HISTORY: Patient returns to clinic today for further evaluation and reconsideration of cycle 6 of FOLFOX plus Avastin.  She currently feels well and is asymptomatic.  She continues to have a mild chronic cough, but otherwise feels well.  She does not complain of weakness or fatigue today.   She continues to tolerate her treatments well without significant side effects.  She has good appetite and is maintaining her weight. Her pain is well-controlled with tramadol.  She has a chronic peripheral neuropathy, but no other neurologic complaints.  She denies any recent fevers or illnesses.  She denies any chest pain, shortness of breath, or hemoptysis. She denies any nausea, vomiting, constipation or diarrhea.  She has no urinary complaints.  Patient offers no further specific complaints today.  REVIEW OF SYSTEMS:   Review of Systems  Constitutional: Negative.  Negative for fever, malaise/fatigue and weight loss.  Respiratory:  Positive for cough. Negative for hemoptysis and shortness of breath.   Cardiovascular: Negative.  Negative for chest pain and leg swelling.  Gastrointestinal:  Negative for abdominal pain, blood in stool, diarrhea, melena, nausea and vomiting.  Genitourinary: Negative.  Negative for dysuria.  Musculoskeletal: Negative.  Negative for back pain, joint pain and neck pain.  Skin: Negative.  Negative for rash.  Neurological:  Positive for tingling and sensory change. Negative for focal  weakness, weakness and headaches.  Psychiatric/Behavioral: Negative.  The patient is not nervous/anxious and does not have insomnia.     As per HPI. Otherwise, a complete review of systems is negative.   PAST MEDICAL HISTORY: Past Medical History:  Diagnosis Date   Anemia due to antineoplastic chemotherapy    Arthritis    Bronchial obstruction    Cataract    bilateral   Chemotherapy-induced neuropathy (HCC)    Chicken pox    Colon polyp    Dyspnea    Follicular lymphoma (HCC) 08/2016   lymph nodes    GERD (gastroesophageal reflux disease)    Hyperlipidemia    Lung nodule    Lung nodules 09/2022   right upper lobe   Mass of right chest wall    Osteoporosis    Protein-calorie malnutrition, severe (HCC)    Rectal carcinoma (HCC)    Thrombocytopenia (HCC)     PAST SURGICAL HISTORY: Past Surgical History:  Procedure Laterality Date   AXILLARY LYMPH NODE DISSECTION Right 08/21/2016   Procedure: AXILLARY LYMPH NODE excision;  Surgeon: Nadeen Landau, MD;  Location: [facility] ORS;  Service: General;  Laterality: Right;   CATARACT EXTRACTION, BILATERAL Bilateral    COLONOSCOPY N/A 09/19/2020   Procedure: COLONOSCOPY;  Surgeon: Regis Bill, MD;  Location: [facility] ENDOSCOPY;  Service: Endoscopy;  Laterality: N/A;   PORTA CATH INSERTION N/A 09/16/2017   Procedure: PORTA CATH INSERTION;  Surgeon: Annice Needy, MD;  Location: [facility] INVASIVE CV LAB;  Service: Cardiovascular;  Laterality: N/A;   TONSILLECTOMY     TOTAL HIP ARTHROPLASTY Left 1992    FAMILY HISTORY: Family History  Problem Relation Age of Onset   Heart failure Mother    Emphysema  Father    Diabetes Sister    Lung cancer Brother    Diabetes Brother    Breast cancer Paternal Aunt    Basal cell carcinoma Daughter     ADVANCED DIRECTIVES (Y/N):  N  HEALTH MAINTENANCE: Social History   Tobacco Use   Smoking status: Never   Smokeless tobacco: Never  Vaping Use   Vaping status: Never Used  Substance  Use Topics   Alcohol use: No   Drug use: No     Colonoscopy:  PAP:  Bone density:  Lipid panel:  No Known Allergies  Current Outpatient Medications  Medication Sig Dispense Refill   apixaban (ELIQUIS) 5 MG TABS tablet Take 1 tablet (5 mg total) by mouth 2 (two) times daily. 180 tablet 3   diclofenac sodium (VOLTAREN) 1 % GEL Apply 2 g topically 2 (two) times daily as needed.     diphenoxylate-atropine (LOMOTIL) 2.5-0.025 MG tablet TAKE 2 TABLETS BY MOUTH 4 (FOUR) TIMES DAILY AS NEEDED FOR DIARRHEA OR LOOSE STOOLS. 60 tablet 1   gabapentin (NEURONTIN) 300 MG capsule TAKE 1 CAPSULE BY MOUTH TWICE A DAY 60 capsule 2   lidocaine-prilocaine (EMLA) cream Apply 1 Application topically as needed. Apply to port 1 hour prior to use as needed 30 g 2   megestrol (MEGACE) 40 MG tablet Take 1 tablet (40 mg total) by mouth daily. 90 tablet 1   ondansetron (ZOFRAN) 8 MG tablet Take 1 tablet (8 mg total) by mouth every 8 (eight) hours as needed for nausea or vomiting. Start on the third day after chemotherapy. 60 tablet 2   pantoprazole (PROTONIX) 20 MG tablet Take 1 tablet by mouth daily as needed for heartburn.     Potassium 99 MG TABS Take 1 tablet by mouth as needed.     simvastatin (ZOCOR) 20 MG tablet Take 20 mg by mouth at bedtime.     tiZANidine (ZANAFLEX) 2 MG tablet TAKE 1 TABLET BY MOUTH NIGHTLY AS NEEDED FOR MUSCLE SPASMS. 90 tablet 1   traMADol-acetaminophen (ULTRACET) 37.5-325 MG tablet Take 1 tablet by mouth every 8 (eight) hours as needed.     digoxin (LANOXIN) 0.125 MG tablet Take 1 tablet (0.125 mg total) by mouth daily. (Patient not taking: Reported on 06/19/2023) 90 tablet 3   No current facility-administered medications for this visit.   Facility-Administered Medications Ordered in Other Visits  Medication Dose Route Frequency Provider Last Rate Last Admin   fluorouracil (ADRUCIL) 3,000 mg in sodium chloride 0.9 % 90 mL chemo infusion  2,400 mg/m2 (Treatment Plan Recorded)  Intravenous 1 day or 1 dose Jeralyn Ruths, MD   3,000 mg at 06/26/23 1245   heparin lock flush 100 unit/mL  500 Units Intravenous Once Jeralyn Ruths, MD       heparin lock flush 100 unit/mL  500 Units Intracatheter PRN Jeralyn Ruths, MD       heparin lock flush 100 unit/mL  500 Units Intravenous Once Jeralyn Ruths, MD       heparin lock flush 100 unit/mL  500 Units Intravenous Once Jeralyn Ruths, MD       sodium chloride flush (NS) 0.9 % injection 10 mL  10 mL Intravenous PRN Jeralyn Ruths, MD   10 mL at 12/04/17 0901   sodium chloride flush (NS) 0.9 % injection 10 mL  10 mL Intracatheter PRN Jeralyn Ruths, MD       sodium chloride flush (NS) 0.9 % injection 10 mL  10  mL Intravenous PRN Jeralyn Ruths, MD   10 mL at 04/24/21 0839   sodium chloride flush (NS) 0.9 % injection 10 mL  10 mL Intracatheter PRN Jeralyn Ruths, MD   10 mL at 04/12/23 1419   yttrium-90 injection 22.3 millicurie  22.3 millicurie Intravenous Once Chrystal, Sherrine Maples, MD        OBJECTIVE: Vitals:   06/26/23 0840  BP: 116/64  Pulse: (!) 106  Temp: 98.6 F (37 C)  SpO2: 99%        Body mass index is 18.37 kg/m.    ECOG FS:0 - Asymptomatic  General: Well-developed, well-nourished, no acute distress. Eyes: Pink conjunctiva, anicteric sclera. HEENT: Normocephalic, moist mucous membranes. Lungs: No audible wheezing or coughing. Heart: Regular rate and rhythm. Abdomen: Soft, nontender, no obvious distention. Musculoskeletal: No edema, cyanosis, or clubbing. Neuro: Alert, answering all questions appropriately. Cranial nerves grossly intact. Skin: No rashes or petechiae noted. Psych: Normal affect.  LAB RESULTS:  Lab Results  Component Value Date   NA 139 06/26/2023   K 3.4 (L) 06/26/2023   CL 107 06/26/2023   CO2 24 06/26/2023   GLUCOSE 109 (H) 06/26/2023   BUN 12 06/26/2023   CREATININE 0.78 06/26/2023   CALCIUM 8.9 06/26/2023   PROT 6.4 (L) 06/26/2023    ALBUMIN 3.5 06/26/2023   AST 24 06/26/2023   ALT 11 06/26/2023   ALKPHOS 70 06/26/2023   BILITOT 0.3 06/26/2023   GFRNONAA >60 06/26/2023   GFRAA >60 06/28/2020    Lab Results  Component Value Date   WBC 6.9 06/26/2023   NEUTROABS 3.9 06/26/2023   HGB 12.0 06/26/2023   HCT 38.5 06/26/2023   MCV 104.1 (H) 06/26/2023   PLT 174 06/26/2023     STUDIES: LONG TERM MONITOR (3-14 DAYS)  Result Date: 06/17/2023 Event monitor Patch Wear Time:  18 days and 9 hours (2024-06-30T11:27:02-399 to 2024-07-28T19:31:35-0400) Monitor 1 Normal sinus rhythm Patient had a min HR of 50 bpm, max HR of 226 bpm, and avg HR of 102 bpm. Atrial Flutter occurred (16% burden), ranging from 78-226 bpm (avg of 126 bpm), the longest lasting 31 mins 49 secs with an avg rate of 168 bpm.  Possible atrial tachycardia Isolated SVEs were frequent (7.0%, 42231), SVE Couplets were rare (<1.0%, 1946), and SVE Triplets were rare (<1.0%, 824). Isolated VEs were rare (<1.0%), VE Couplets were rare (<1.0%), and no VE Triplets were present. Monitor 2 Normal sinus rhythm Patient had a min HR of 48 bpm, max HR of 235 bpm, and avg HR of 100 bpm. 1 run of Ventricular Tachycardia occurred lasting 4 beats with a max rate of 226 bpm (avg 202 bpm). Atrial Fibrillation/Flutter occurred (15% burden), ranging from 66-235 bpm (avg of 115 bpm), the longest lasting 22 mins 56 secs with an avg rate of 102 bpm.  Possible atrial tachycardia. Isolated SVEs were frequent (9.0%, [id number]), SVE Couplets were occasional (1.4%, 14036), and SVE Triplets were rare (<1.0%, 6711). Isolated VEs were rare (<1.0%), VE Couplets were rare (<1.0%), and no VE Triplets were present. Signed, Dossie Arbour, MD, Ph.D [facility]    ONCOLOGY HISTORY:  Biopsy confirmed rectal cancer.  MRI completed at [facility] on October 11, 2020 revealed extension of the tumor beyond the wall into the rectovaginal recess with suspected invasion of the vaginal wall posteriorly.  Tumor also  appears to involve the internal anal sphincter.  There are also numerous prominent presacral and mesorectal lymph nodes highly suspicious for malignancy.  PET scan  results from November 17, 2020 reviewed independently with 3 hypermetabolic right lower lobe lung nodules consistent with pulmonary metastasis.  Patient completed cycle 8 of FOLFOX on Mar 29, 2021.  She then completed cycle 5 of her 5-FU pump on May 26, 2021 and XRT on June 01, 2021.  PET scan results from July 17, 2021 reviewed independently with no evidence of disease other than enlarging right lower lobe lung lesion.  Patient completed SBRT to the right lung lesion on August 22, 2021.  Repeat PET scan on July 25, 2021 revealed minimal residual disease in the rectum and likely resolution of lesions in the lung.    ASSESSMENT: Recurrent stage IV rectal cancer, history of follicular lymphoma.  PLAN:    Recurrent stage IV rectal cancer: See oncology history as above.  PET scan results from Mar 21, 2023 reviewed independently with clear progression of disease in patient's pelvis as well as multiple pulmonary nodules.  Her CEA initially increased to 29.8, but now has trended down to 13.2.  Hospice and end-of-life care were previously discussed, but patient preferred to pursue treatment.  Proceed with cycle 6 of FOLFOX plus Avastin today.  Return to clinic in 2 days for pump removal and Udenyca.  Patient will then in 2 weeks for further evaluation and consideration of cycle 7.  Will reimage with PET scan prior to next treatment.   Recurrent follicular lymphoma: CT scan results from December 23, 2019 reviewed independently with no obvious evidence of recurrent or progressive disease.  PET scan results as above consistent with rectal cancer metastasis and no evidence of lymphoma.   Patient received Zevalin on July 30, 2017 with not much therapeutic effect.  She subsequently underwent cycles 5 of R-CHOP chemotherapy with Neulasta support  completing on December 12, 2017.  Patient continues to be in complete remission.  Neuropathy: Chronic and unchanged.   Pelvic pain: Patient does not complain of this today.  Continue tramadol as prescribed.   Poor appetite/weight loss: Resolved.  Continue Megace as prescribed. Neutropenia: Resolved.  Continue Udenyca with pump removal. Anemia: Resolved. Thrombocytopenia: Resolved.  Proceed with treatment as above.   Hypokalemia: Improved.  Continue oral potassium supplementation.  Patient expressed understanding and was in agreement with this plan. She also understands that She can call clinic at any time with any questions, concerns, or complaints.    Cancer Staging  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 [facility]) Staging form: Lymphoid Neoplasms, AJCC 6th Edition - Clinical stage from 08/27/2016: Stage IIE - Signed by Jeralyn Ruths, MD on 08/27/2016  Rectal cancer [facility]) Staging form: Colon and Rectum, AJCC 8th Edition - Clinical: Stage IVA Dewaine Oats, [place]) - Signed by Jeralyn Ruths, MD on 11/02/2020   Jeralyn Ruths, MD   06/26/2023 4:44 PM

## 2023-06-28 ENCOUNTER — Inpatient Hospital Stay: Payer: 59

## 2023-06-28 DIAGNOSIS — Z5111 Encounter for antineoplastic chemotherapy: Secondary | ICD-10-CM | POA: Diagnosis not present

## 2023-06-28 DIAGNOSIS — C20 Malignant neoplasm of rectum: Secondary | ICD-10-CM

## 2023-06-28 MED ORDER — HEPARIN SOD (PORK) LOCK FLUSH 100 UNIT/ML IV SOLN
500.0000 [IU] | Freq: Once | INTRAVENOUS | Status: DC | PRN
  Filled 2023-06-28: qty 5

## 2023-06-28 MED ORDER — PEGFILGRASTIM-CBQV 6 MG/0.6ML ~~LOC~~ SOSY
6.0000 mg | PREFILLED_SYRINGE | Freq: Once | SUBCUTANEOUS | Status: AC
  Administered 2023-06-28: 6 mg via SUBCUTANEOUS
  Filled 2023-06-28: qty 0.6

## 2023-06-28 MED ORDER — SODIUM CHLORIDE 0.9 % IV SOLN
Freq: Once | INTRAVENOUS | Status: AC
  Filled 2023-06-28: qty 250

## 2023-06-28 MED ORDER — SODIUM CHLORIDE 0.9% FLUSH
10.0000 mL | INTRAVENOUS | Status: DC | PRN
  Administered 2023-06-28: 10 mL
  Filled 2023-06-28: qty 10

## 2023-07-01 ENCOUNTER — Other Ambulatory Visit: Payer: Self-pay | Admitting: Oncology

## 2023-07-02 ENCOUNTER — Ambulatory Visit (HOSPITAL_COMMUNITY)
Admission: RE | Admit: 2023-07-02 | Discharge: 2023-07-02 | Disposition: A | Discharge status: Home / Self Care | Payer: 59 | Source: Ambulatory Visit | Attending: Internal Medicine | Admitting: Internal Medicine

## 2023-07-02 ENCOUNTER — Other Ambulatory Visit: Payer: Self-pay

## 2023-07-02 VITALS — BP 126/82 | HR 121 | Ht <= 58 in | Wt 84.6 lb

## 2023-07-02 DIAGNOSIS — I480 Paroxysmal atrial fibrillation: Secondary | ICD-10-CM | POA: Insufficient documentation

## 2023-07-02 DIAGNOSIS — Z7901 Long term (current) use of anticoagulants: Secondary | ICD-10-CM | POA: Diagnosis not present

## 2023-07-02 DIAGNOSIS — D6869 Other thrombophilia: Secondary | ICD-10-CM

## 2023-07-02 DIAGNOSIS — R9431 Abnormal electrocardiogram [ECG] [EKG]: Secondary | ICD-10-CM | POA: Diagnosis not present

## 2023-07-02 DIAGNOSIS — I4891 Unspecified atrial fibrillation: Secondary | ICD-10-CM | POA: Diagnosis not present

## 2023-07-02 MED ORDER — METOPROLOL SUCCINATE ER 25 MG PO TB24: 12.5000 mg | ORAL_TABLET | Freq: Every day | ORAL | 2 refills | Status: DC

## 2023-07-02 NOTE — Progress Notes (Signed)
Primary Care Physician: Barbette Reichmann, MD Primary Cardiologist: None Electrophysiologist: None     Referring Physician: Dr. Enid Cutter Veronica Boyd is a 77 y.o. female with a history of rectal cancer, aortic atherosclerosis by CT imaging, follicular lymphoma, and atrial flutter/fibrillation who presents for consultation in the [facility]. Cardiac monitor worn for 1 month in July showed 15/16% burden of paroxysmal atrial fibrillation/flutter. Currently undergoing treatment for cancer per recent oncology visit. Patient is on Eliquis 5 mg BID for a CHADS2VASC score of at least 4.  On evaluation today, she is currently in NSR. She began Eliquis 5 mg BID on 8/20. She could not tolerate the digoxin and stopped it. Daughter with patient today states patient has been undergoing chemotherapy intermittently since 2016/2017. Per recent oncology visit on 06/26/23:  Recurrent stage IV rectal cancer: See oncology history as above.  PET scan results from Mar 21, 2023 reviewed independently with clear progression of disease in patient's pelvis as well as multiple pulmonary nodules.  Her CEA initially increased to 29.8, but now has trended down to 13.2.  Hospice and end-of-life care were previously discussed, but patient preferred to pursue treatment.  Proceed with cycle 6 of FOLFOX plus Avastin today.  Return to clinic in 2 days for pump removal and Udenyca.  Patient will then in 2 weeks for further evaluation and consideration of cycle 7.  Will reimage with PET scan prior to next treatment.    Today, she denies symptoms of palpitations, chest pain, shortness of breath, orthopnea, PND, lower extremity edema, dizziness, presyncope, syncope, snoring, daytime somnolence, bleeding, or neurologic sequela. The patient is tolerating medications without difficulties and is otherwise without complaint today.    she has a BMI of Body mass index is 17.68 kg/m.Marland Kitchen Filed Weights    07/02/23 0849  Weight: 38.4 kg    Current Outpatient Medications  Medication Sig Dispense Refill   apixaban (ELIQUIS) 5 MG TABS tablet Take 1 tablet (5 mg total) by mouth 2 (two) times daily. 180 tablet 3   diclofenac sodium (VOLTAREN) 1 % GEL Apply 2 g topically 2 (two) times daily as needed.     diphenoxylate-atropine (LOMOTIL) 2.5-0.025 MG tablet TAKE 2 TABLETS BY MOUTH 4 (FOUR) TIMES DAILY AS NEEDED FOR DIARRHEA OR LOOSE STOOLS. 60 tablet 1   gabapentin (NEURONTIN) 300 MG capsule TAKE 1 CAPSULE BY MOUTH TWICE A DAY 60 capsule 2   lidocaine-prilocaine (EMLA) cream Apply 1 Application topically as needed. Apply to port 1 hour prior to use as needed 30 g 2   megestrol (MEGACE) 40 MG tablet Take 1 tablet (40 mg total) by mouth daily. 90 tablet 1   metoprolol succinate (TOPROL XL) 25 MG 24 hr tablet Take 0.5 tablets (12.5 mg total) by mouth at bedtime. 30 tablet 2   ondansetron (ZOFRAN) 8 MG tablet Take 1 tablet (8 mg total) by mouth every 8 (eight) hours as needed for nausea or vomiting. Start on the third day after chemotherapy. 60 tablet 2   pantoprazole (PROTONIX) 20 MG tablet Take 1 tablet by mouth daily as needed for heartburn.     Potassium 99 MG TABS Take 1 tablet by mouth as needed.     simvastatin (ZOCOR) 20 MG tablet Take 20 mg by mouth at bedtime.     tiZANidine (ZANAFLEX) 2 MG tablet TAKE 1 TABLET BY MOUTH NIGHTLY AS NEEDED FOR MUSCLE SPASMS. 90 tablet 1   traMADol-acetaminophen (ULTRACET) 37.5-325 MG tablet Take  1 tablet by mouth every 8 (eight) hours as needed.     digoxin (LANOXIN) 0.125 MG tablet Take 1 tablet (0.125 mg total) by mouth daily. (Patient not taking: Reported on 06/19/2023) 90 tablet 3   No current facility-administered medications for this encounter.   Facility-Administered Medications Ordered in Other Encounters  Medication Dose Route Frequency Provider Last Rate Last Admin   heparin lock flush 100 unit/mL  500 Units Intravenous Once Orlie Dakin, Tollie Pizza, MD        heparin lock flush 100 unit/mL  500 Units Intracatheter PRN Orlie Dakin, Tollie Pizza, MD       heparin lock flush 100 unit/mL  500 Units Intravenous Once Jeralyn Ruths, MD       heparin lock flush 100 unit/mL  500 Units Intravenous Once Jeralyn Ruths, MD       sodium chloride flush (NS) 0.9 % injection 10 mL  10 mL Intravenous PRN Jeralyn Ruths, MD   10 mL at 12/04/17 0901   sodium chloride flush (NS) 0.9 % injection 10 mL  10 mL Intracatheter PRN Jeralyn Ruths, MD       sodium chloride flush (NS) 0.9 % injection 10 mL  10 mL Intravenous PRN Jeralyn Ruths, MD   10 mL at 04/24/21 0839   sodium chloride flush (NS) 0.9 % injection 10 mL  10 mL Intracatheter PRN Jeralyn Ruths, MD   10 mL at 04/12/23 1419   yttrium-90 injection 22.3 millicurie  22.3 millicurie Intravenous Once Carmina Miller, MD        Atrial Fibrillation Management history:  Previous antiarrhythmic drugs: None Previous cardioversions: None Previous ablations: None Anticoagulation history: Eliquis   ROS- All systems are reviewed and negative except as per the HPI above.  Physical Exam: BP 126/82   Pulse (!) 121   Ht 4\' 10"  (1.473 m)   Wt 38.4 kg   BMI 17.68 kg/m   GEN: Well nourished, well developed in no acute distress NECK: No JVD; No carotid bruits CARDIAC: Regular tachycardic rate and rhythm, no murmurs, rubs, gallops RESPIRATORY:  Clear to auscultation without rales, wheezing or rhonchi  ABDOMEN: Soft, non-tender, non-distended EXTREMITIES:  No edema; No deformity   EKG today demonstrates  Vent. rate 121 BPM PR interval 162 ms QRS duration 64 ms QT/QTcB 322/457 ms P-R-T axes -15 -55 -7 Sinus tachycardia Left anterior fascicular block Septal infarct , age undetermined Abnormal ECG When compared with ECG of 01-May-2023 10:38, PREVIOUS ECG IS PRESENT  Echo 04/08/23 demonstrated   1. Left ventricular ejection fraction, by estimation, is >55%. The left  ventricle has  normal function. Left ventricular endocardial border not  optimally defined to evaluate regional wall motion. Left ventricular  diastolic function could not be  evaluated.   2. Right ventricular systolic function is normal. The right ventricular  size is normal.   3. The mitral valve was not well visualized. No evidence of mitral valve  regurgitation.   4. Tricuspid valve regurgitation not well assessed.   5. The aortic valve was not well visualized. Aortic valve regurgitation  not well assessed.   6. Pulmonic valve regurgitation not well assessed.   Cardiac monitor 6/30-7/28/24: Confirms paroxysmal atrial flutter/fibrillation Estimated burden 15 to 16%   ASSESSMENT & PLAN CHA2DS2-VASc Score =    The patient's score is based upon:        ASSESSMENT AND PLAN: Paroxysmal Atrial Fibrillation (ICD10:  I48.0) / Atrial Flutter The patient's CHA2DS2-VASc score is 4, indicating  a 4.8% annual risk of stroke.    Education provided about Afib. We discussed potential treatment given her burden on recent cardiac monitor. Due to her ongoing oncology therapy, I am unable to consider any AAD option as it would be contraindicated with her chemotherapy treatment. She could consider ablation as a procedure for treatment but declines to meet EP at this point. After discussion, we will proceed with conservative observation at this time. Rhythm monitoring device recommended.   Secondary Hypercoagulable State (ICD10:  D68.69) The patient is at significant risk for stroke/thromboembolism based upon her CHA2DS2-VASc Score of 4.  Continue Apixaban (Eliquis).  No missed doses. Recommended to continue without interruption.    Follow up as scheduled with Cardiology. F/u Afib clinic prn.    Lake Bells, PA-C  Afib Clinic [facility] [address] [place], [place] [address] [phone]

## 2023-07-02 NOTE — Patient Instructions (Signed)
Please begin taking Metoprolol 1/2 tablet (12.5 mg) every evening.

## 2023-07-03 ENCOUNTER — Ambulatory Visit: Payer: 59

## 2023-07-04 ENCOUNTER — Ambulatory Visit: Payer: 59 | Attending: Nurse Practitioner | Admitting: Nurse Practitioner

## 2023-07-04 ENCOUNTER — Encounter: Payer: Self-pay | Admitting: Nurse Practitioner

## 2023-07-04 VITALS — BP 142/86 | HR 98 | Ht <= 58 in | Wt 85.0 lb

## 2023-07-04 DIAGNOSIS — R030 Elevated blood-pressure reading, without diagnosis of hypertension: Secondary | ICD-10-CM | POA: Diagnosis not present

## 2023-07-04 DIAGNOSIS — R0609 Other forms of dyspnea: Secondary | ICD-10-CM | POA: Diagnosis not present

## 2023-07-04 DIAGNOSIS — I480 Paroxysmal atrial fibrillation: Secondary | ICD-10-CM | POA: Diagnosis not present

## 2023-07-04 NOTE — Patient Instructions (Signed)
Medication Instructions:  The current medical regimen is effective;  continue present plan and medications.  *If you need a refill on your cardiac medications before your next appointment, please call your pharmacy*   Testing/Procedures: Your provider has ordered a Lexiscan/ Exercise Myoview Stress test. This will take place at [facility]. Please report to the [facility] medical mall entrance. The volunteers at the first desk will direct you where to go.  [facility] MYOVIEW  Your provider has ordered a Stress Test with nuclear imaging. The purpose of this test is to evaluate the blood supply to your heart muscle. This procedure is referred to as a "Non-Invasive Stress Test." This is because other than having an IV started in your vein, nothing is inserted or "invades" your body. Cardiac stress tests are done to find areas of poor blood flow to the heart by determining the extent of coronary artery disease (CAD). Some patients exercise on a treadmill, which naturally increases the blood flow to your heart, while others who are unable to walk on a treadmill due to physical limitations will have a pharmacologic/chemical stress agent called Lexiscan . This medicine will mimic walking on a treadmill by temporarily increasing your coronary blood flow.   Please note: these test may take anywhere between 2-4 hours to complete  How to prepare for your Myoview test:  Nothing to eat for 6 hours prior to the test No caffeine for 24 hours prior to test No smoking 24 hours prior to test. Your medication may be taken with water.  If your doctor stopped a medication because of this test, do not take that medication. Ladies, please do not wear dresses.  Skirts or pants are appropriate. Please wear a short sleeve shirt. No perfume, cologne or lotion. Wear comfortable walking shoes. No heels!   PLEASE NOTIFY THE OFFICE AT LEAST 24 HOURS IN ADVANCE IF YOU ARE UNABLE TO KEEP YOUR APPOINTMENT.  [phone] AND  PLEASE NOTIFY  NUCLEAR MEDICINE AT [facility] AT LEAST 24 HOURS IN ADVANCE IF YOU ARE UNABLE TO KEEP YOUR APPOINTMENT. [phone]    Follow-Up: At [facility], you and your health needs are our priority.  As part of our continuing mission to provide you with exceptional heart care, we have created designated Provider Care Teams.  These Care Teams include your primary Cardiologist (physician) and Advanced Practice Providers (APPs -  Physician Assistants and Nurse Practitioners) who all work together to provide you with the care you need, when you need it.  We recommend signing up for the patient portal called "MyChart".  Sign up information is provided on this After Visit Summary.  MyChart is used to connect with patients for Virtual Visits (Telemedicine).  Patients are able to view lab/test results, encounter notes, upcoming appointments, etc.  Non-urgent messages can be sent to your provider as well.   To learn more about what you can do with MyChart, go to [email or web address].    Your next appointment:   1 month(s)  Provider:   Steffanie Dunn, MD or Sherie Don, NP

## 2023-07-04 NOTE — Progress Notes (Signed)
Office Visit    Patient Name: Veronica Boyd Date of Encounter: 07/04/2023  Primary Care Provider:  Barbette Reichmann, MD Primary Cardiologist:  Julien Nordmann, MD  Chief Complaint    77 y.o. female with a history of paroxysmal atrial fibrillation, stage IV rectal cancer, follicular lymphoma, chemotherapy-induced neuropathy, anemia, arthritis, GERD, colon polyps, hyperlipidemia, lung nodules, osteoporosis, thrombocytopenia, and protein calorie malnutrition, presents for follow-up of atrial fibrillation.  Past Medical History    Past Medical History:  Diagnosis Date   Anemia due to antineoplastic chemotherapy    Arthritis    Atrial flutter (HCC)    Bronchial obstruction    Cataract    bilateral   Chemotherapy-induced neuropathy (HCC)    Chicken pox    Colon polyp    Coronary artery calcification seen on CT scan    a. 11/2022 CT Chest: Coronary artery calcification and aortic atherosclerosis.   Follicular lymphoma (HCC) 08/2016   lymph nodes    GERD (gastroesophageal reflux disease)    History of echocardiogram    a. 04/2023 Echo: EF >55%, nl RV size/fxn.  No significant valvular disease observed.   Hyperlipidemia    Lung nodules 09/2022   right upper lobe   Mass of right chest wall    Osteoporosis    PAF (paroxysmal atrial fibrillation) (HCC)    a. 04/2023 Zio: Monitor 1 - 16 % aflutter burden w/ avg rate of 168, (78-226), possible Atach. Freq PACs, rare PVCs. Monitor 2 - 15% afib/flutter burden @ 115 [phone]). Poss Atach. 4 beats NSVT. Freq PACs, rare PVCs; b. CHA2DS2VASc = 4-->eliquis.   Protein-calorie malnutrition, severe (HCC)    Rectal carcinoma (HCC)    Thrombocytopenia (HCC)    Past Surgical History:  Procedure Laterality Date   AXILLARY LYMPH NODE DISSECTION Right 08/21/2016   Procedure: AXILLARY LYMPH NODE excision;  Surgeon: Nadeen Landau, MD;  Location: [facility];  Service: General;  Laterality: Right;   CATARACT EXTRACTION, BILATERAL Bilateral     COLONOSCOPY N/A 09/19/2020   Procedure: COLONOSCOPY;  Surgeon: Regis Bill, MD;  Location: [facility];  Service: Endoscopy;  Laterality: N/A;   PORTA CATH INSERTION N/A 09/16/2017   Procedure: PORTA CATH INSERTION;  Surgeon: Annice Needy, MD;  Location: [facility];  Service: Cardiovascular;  Laterality: N/A;   TONSILLECTOMY     TOTAL HIP ARTHROPLASTY Left 1992    Allergies  No Known Allergies  History of Present Illness      77 y.o. y/o female with a history of paroxysmal atrial fibrillation, stage IV rectal cancer, follicular lymphoma, chemotherapy-induced neuropathy, anemia, arthritis, GERD, colon polyps, hyperlipidemia, lung nodules, osteoporosis, thrombocytopenia, and protein calorie malnutrition.  She has a history of paroxysmal atrial fibrillation dating back to 2019, when she developed A-fib related to sepsis, which was conservatively managed.  In the spring 2024, she was noted to have an irregular heart rhythm on examination in oncology clinic.  Echocardiogram in June 2024 showed EF greater than 55% with normal RV size and function without any significant valvular disease.  She was referred to Dr. Mariah Milling and was noted to be in sinus tachycardia with PACs.  A Zio monitor was placed and subsequently showed 15 to 16% A-fib/flutter burden (2 monitors worn over 12 days because first monitor fell off), with rates sometimes into the 230s.  She was started on Eliquis 5 mg twice daily, and seen in A-fib clinic on July 02, 2023 for.  She was not felt to be a  candidate for antiarrhythmic therapy in the setting of ongoing chemotherapy and multiple drug interactions.  Referral for catheter ablation was offered but patient declined and decision was made to continue conservative care.   Fortunately, Veronica Boyd is asymptomatic when she is in atrial fibrillation.  She was initially advised to take digoxin via one of our phone notes however, this caused her to cough and she has not  been taking.  Her daughter has a concern that some of the side effects associate with digoxin toxicity might really be exacerbated in Veronica Boyd as she is already somewhat frail and has a poor appetite.  Though she does not experience palpitations or chest pain, Veronica Boyd has had progressive dyspnea on exertion over the past year.  Minimal activity causes her to be short of breath.  We discussed her echo results.  She is interested in pursuing stress testing given progressive symptoms and history of coronary calcium.  She denies PND, orthopnea, dizziness, syncope, edema, or early satiety.  Home Medications    Current Outpatient Medications  Medication Sig Dispense Refill   apixaban (ELIQUIS) 5 MG TABS tablet Take 1 tablet (5 mg total) by mouth 2 (two) times daily. 180 tablet 3   diclofenac sodium (VOLTAREN) 1 % GEL Apply 2 g topically 2 (two) times daily as needed.     diphenoxylate-atropine (LOMOTIL) 2.5-0.025 MG tablet TAKE 2 TABLETS BY MOUTH 4 (FOUR) TIMES DAILY AS NEEDED FOR DIARRHEA OR LOOSE STOOLS. 60 tablet 1   gabapentin (NEURONTIN) 300 MG capsule TAKE 1 CAPSULE BY MOUTH TWICE A DAY 60 capsule 2   lidocaine-prilocaine (EMLA) cream Apply 1 Application topically as needed. Apply to port 1 hour prior to use as needed 30 g 2   megestrol (MEGACE) 40 MG tablet Take 1 tablet (40 mg total) by mouth daily. 90 tablet 1   metoprolol succinate (TOPROL XL) 25 MG 24 hr tablet Take 0.5 tablets (12.5 mg total) by mouth at bedtime. 30 tablet 2   ondansetron (ZOFRAN) 8 MG tablet Take 1 tablet (8 mg total) by mouth every 8 (eight) hours as needed for nausea or vomiting. Start on the third day after chemotherapy. 60 tablet 2   pantoprazole (PROTONIX) 20 MG tablet Take 1 tablet by mouth daily as needed for heartburn.     Potassium 99 MG TABS Take 1 tablet by mouth as needed.     simvastatin (ZOCOR) 20 MG tablet Take 20 mg by mouth at bedtime.     tiZANidine (ZANAFLEX) 2 MG tablet TAKE 1 TABLET BY MOUTH  NIGHTLY AS NEEDED FOR MUSCLE SPASMS. 90 tablet 1   traMADol-acetaminophen (ULTRACET) 37.5-325 MG tablet Take 1 tablet by mouth every 8 (eight) hours as needed.     digoxin (LANOXIN) 0.125 MG tablet Take 1 tablet (0.125 mg total) by mouth daily. (Patient not taking: Reported on 06/19/2023) 90 tablet 3   Review of Systems    Progressive dyspnea on exertion over the past year.  She denies chest pain, palpitations, PND, orthopnea, dizziness, syncope, edema, or early satiety.  All other systems reviewed and are otherwise negative except as noted above.    Physical Exam    VS:  BP (!) 142/86 (BP Location: Left Arm, Patient Position: Sitting, Cuff Size: Normal)   Pulse 98   Ht 4\' 10"  (1.473 m)   Wt 85 lb (38.6 kg)   SpO2 96%   BMI 17.77 kg/m  , BMI Body mass index is 17.77 kg/m.     GEN: Frail, in  no acute distress. HEENT: normal. Neck: Supple, no JVD, carotid bruits, or masses. Cardiac: RRR, no murmurs, rubs, or gallops. No clubbing, cyanosis, edema.  Radials 2+/PT 2+ and equal bilaterally.  Respiratory:  Respirations regular and unlabored, clear to auscultation bilaterally. GI: Soft, nontender, nondistended, BS + x 4. MS: no deformity or atrophy. Skin: warm and dry, no rash. Neuro:  Strength and sensation are intact. Psych: Normal affect.  Accessory Clinical Findings    ECG personally reviewed by me today - EKG Interpretation Date/Time:  Thursday July 04 2023 15:02:53 EDT Ventricular Rate:  108 PR Interval:  156 QRS Duration:  62 QT Interval:  360 QTC Calculation: 482 R Axis:   37  Text Interpretation: Sinus tachycardia with Premature atrial complexes Possible Left atrial enlargement Low voltage QRS Premature atrial complexes Confirmed by Nicolasa Ducking [phone]) on 07/04/2023 3:14:05 PM  - no acute changes.  Lab Results  Component Value Date   WBC 6.9 06/26/2023   HGB 12.0 06/26/2023   HCT 38.5 06/26/2023   MCV 104.1 (H) 06/26/2023   PLT 174 06/26/2023   Lab Results   Component Value Date   CREATININE 0.78 06/26/2023   BUN 12 06/26/2023   NA 139 06/26/2023   K 3.4 (L) 06/26/2023   CL 107 06/26/2023   CO2 24 06/26/2023   Lab Results  Component Value Date   ALT 11 06/26/2023   AST 24 06/26/2023   ALKPHOS 70 06/26/2023   BILITOT 0.3 06/26/2023   Assessment & Plan    1.  Paroxysmal atrial fibrillation: Patient recently wore 2 separate ZIO monitors because the first 1 fell off, and she was noted to have a 15 to 16% A-fib/flutter burden.  She has been taking Eliquis 5 mg twice daily as prescribed.  She was initially prescribed digoxin but this caused her to cough and she has discontinued and prefers not to resume given side effect profile.  Fortunately, she has been tolerating low-dose Toprol XL, which she takes in the evening.  She denies palpitations.  She was not felt to be suitable antiarrhythmic candidate by provider in the A-fib clinic due to ongoing chemotherapy and concerns for drug to drug interactions.  I will arrange for her to follow-up with Dr. Lalla Brothers and team locally.  2.  Dyspnea on exertion: Patient with progressive dyspnea on exertion over the past year.  Previously seen by pulmonology and though felt to be multifactorial, no clear evidence of pulmonary compromise.  Recent echo showed normal LV function without significant valvular disease.  Patient does not experience chest pain but has known coronary calcium and aortic atherosclerosis.  Discussed with her and her daughter today.  She is interested in pursuing a stress test and I will arrange for a Lexiscan Myoview.  She has a PET scan scheduled for next week so we will have to do in roughly 2 weeks.  3.  Elevated blood pressure recording: Blood pressure elevated today though she typically runs in the 90s both on prior office visits and at home.  She is on low-dose metoprolol and has been tolerating this.  4.  Disposition: Follow-up Lexiscan Myoview.  Follow-up with electrophysiology here in  [place].  Nicolasa Ducking, NP 07/04/2023, 5:58 PM

## 2023-07-05 ENCOUNTER — Other Ambulatory Visit: Payer: Self-pay

## 2023-07-05 ENCOUNTER — Other Ambulatory Visit: Payer: Self-pay | Admitting: *Deleted

## 2023-07-05 ENCOUNTER — Other Ambulatory Visit: Payer: Self-pay | Admitting: Oncology

## 2023-07-05 DIAGNOSIS — C20 Malignant neoplasm of rectum: Secondary | ICD-10-CM

## 2023-07-06 ENCOUNTER — Other Ambulatory Visit: Payer: Self-pay

## 2023-07-09 ENCOUNTER — Ambulatory Visit: Payer: 59

## 2023-07-09 ENCOUNTER — Encounter: Payer: Self-pay | Admitting: Oncology

## 2023-07-10 ENCOUNTER — Other Ambulatory Visit: Payer: Self-pay

## 2023-07-10 ENCOUNTER — Ambulatory Visit
Admission: RE | Admit: 2023-07-10 | Discharge: 2023-07-10 | Disposition: A | Discharge status: Home / Self Care | Payer: 59 | Source: Ambulatory Visit | Attending: Oncology | Admitting: Oncology

## 2023-07-10 DIAGNOSIS — C20 Malignant neoplasm of rectum: Secondary | ICD-10-CM | POA: Insufficient documentation

## 2023-07-10 MED ORDER — IOHEXOL 300 MG/ML  SOLN
100.0000 mL | Freq: Once | INTRAMUSCULAR | Status: AC | PRN
  Administered 2023-07-10: 75 mL via INTRAVENOUS

## 2023-07-11 ENCOUNTER — Encounter: Payer: Self-pay | Admitting: Oncology

## 2023-07-12 ENCOUNTER — Other Ambulatory Visit: Payer: Self-pay

## 2023-07-15 ENCOUNTER — Other Ambulatory Visit: Payer: Self-pay | Admitting: Oncology

## 2023-07-16 ENCOUNTER — Encounter
Admission: RE | Admit: 2023-07-16 | Discharge: 2023-07-16 | Disposition: A | Discharge status: Home / Self Care | Payer: 59 | Source: Ambulatory Visit | Attending: Nurse Practitioner | Admitting: Nurse Practitioner

## 2023-07-16 DIAGNOSIS — R0609 Other forms of dyspnea: Secondary | ICD-10-CM | POA: Diagnosis present

## 2023-07-16 MED ORDER — TECHNETIUM TC 99M TETROFOSMIN IV KIT
10.0000 | PACK | Freq: Once | INTRAVENOUS | Status: AC
  Administered 2023-07-16: 10.69 via INTRAVENOUS

## 2023-07-16 MED ORDER — REGADENOSON 0.4 MG/5ML IV SOLN
0.4000 mg | Freq: Once | INTRAVENOUS | Status: AC
  Administered 2023-07-16: 0.4 mg via INTRAVENOUS

## 2023-07-16 MED ORDER — TECHNETIUM TC 99M TETROFOSMIN IV KIT
31.7800 | PACK | Freq: Once | INTRAVENOUS | Status: AC | PRN
  Administered 2023-07-16: 31.78 via INTRAVENOUS

## 2023-07-16 MED FILL — Dexamethasone Sodium Phosphate Inj 100 MG/10ML: INTRAMUSCULAR | Qty: 1 | Status: AC

## 2023-07-17 ENCOUNTER — Inpatient Hospital Stay (HOSPITAL_BASED_OUTPATIENT_CLINIC_OR_DEPARTMENT_OTHER): Payer: 59 | Admitting: Oncology

## 2023-07-17 ENCOUNTER — Inpatient Hospital Stay: Payer: 59 | Attending: Oncology

## 2023-07-17 ENCOUNTER — Inpatient Hospital Stay: Payer: 59

## 2023-07-17 ENCOUNTER — Other Ambulatory Visit: Payer: Self-pay

## 2023-07-17 DIAGNOSIS — C7801 Secondary malignant neoplasm of right lung: Secondary | ICD-10-CM | POA: Insufficient documentation

## 2023-07-17 DIAGNOSIS — C20 Malignant neoplasm of rectum: Secondary | ICD-10-CM

## 2023-07-17 DIAGNOSIS — Z79899 Other long term (current) drug therapy: Secondary | ICD-10-CM | POA: Insufficient documentation

## 2023-07-17 DIAGNOSIS — Z5111 Encounter for antineoplastic chemotherapy: Secondary | ICD-10-CM | POA: Insufficient documentation

## 2023-07-17 DIAGNOSIS — Z5112 Encounter for antineoplastic immunotherapy: Secondary | ICD-10-CM | POA: Diagnosis present

## 2023-07-17 LAB — CMP (CANCER CENTER ONLY)
ALT: 13 U/L (ref 0–44)
AST: 23 U/L (ref 15–41)
Albumin: 3.8 g/dL (ref 3.5–5.0)
Alkaline Phosphatase: 69 U/L (ref 38–126)
Anion gap: 8 (ref 5–15)
BUN: 14 mg/dL (ref 8–23)
CO2: 24 mmol/L (ref 22–32)
Calcium: 8.9 mg/dL (ref 8.9–10.3)
Chloride: 104 mmol/L (ref 98–111)
Creatinine: 0.71 mg/dL (ref 0.44–1.00)
GFR, Estimated: >60 mL/min (ref >60)
Glucose, Bld: 100 mg/dL — ABNORMAL HIGH (ref 70–99)
Potassium: 3.7 mmol/L (ref 3.5–5.1)
Sodium: 136 mmol/L (ref 135–145)
Total Bilirubin: 0.3 mg/dL (ref 0.3–1.2)
Total Protein: 6.9 g/dL (ref 6.5–8.1)

## 2023-07-17 LAB — CBC WITH DIFFERENTIAL (CANCER CENTER ONLY)
Abs Immature Granulocytes: 0.04 10*3/uL (ref 0.00–0.07)
Basophils Absolute: 0 10*3/uL (ref 0.0–0.1)
Basophils Relative: 0 %
Eosinophils Absolute: 0.1 10*3/uL (ref 0.0–0.5)
Eosinophils Relative: 1 %
HCT: 39 % (ref 36.0–46.0)
Hemoglobin: 12.3 g/dL (ref 12.0–15.0)
Immature Granulocytes: 1 %
Lymphocytes Relative: 28 %
Lymphs Abs: 1.9 10*3/uL (ref 0.7–4.0)
MCH: 33.2 pg (ref 26.0–34.0)
MCHC: 31.5 g/dL (ref 30.0–36.0)
MCV: 105.1 fL — ABNORMAL HIGH (ref 80.0–100.0)
Monocytes Absolute: 1.3 10*3/uL — ABNORMAL HIGH (ref 0.1–1.0)
Monocytes Relative: 20 %
Neutro Abs: 3.4 10*3/uL (ref 1.7–7.7)
Neutrophils Relative %: 50 %
Platelet Count: 151 10*3/uL (ref 150–400)
RBC: 3.71 MIL/uL — ABNORMAL LOW (ref 3.87–5.11)
RDW: 15.6 % — ABNORMAL HIGH (ref 11.5–15.5)
WBC Count: 6.7 10*3/uL (ref 4.0–10.5)
nRBC: 0 % (ref 0.0–0.2)

## 2023-07-17 LAB — NM MYOCAR MULTI W/SPECT W/WALL MOTION / EF
LV dias vol: 33 mL (ref 46–106)
LV sys vol: 11 mL
Nuc Stress EF: 67 %
Peak HR: 108 {beats}/min
Rest HR: 82 {beats}/min
Rest Nuclear Isotope Dose: 10.7 mCi
SDS: 0
SRS: 4
SSS: 5
ST Depression (mm): 0 mm
Stress Nuclear Isotope Dose: 31.8 mCi
TID: 0.71

## 2023-07-17 LAB — PROTEIN, URINE, RANDOM: Total Protein, Urine: 7 mg/dL

## 2023-07-17 MED ORDER — DIPHENHYDRAMINE HCL 50 MG/ML IJ SOLN
25.0000 mg | Freq: Once | INTRAMUSCULAR | Status: AC
  Administered 2023-07-17: 25 mg via INTRAVENOUS
  Filled 2023-07-17: qty 1

## 2023-07-17 MED ORDER — SODIUM CHLORIDE 0.9 % IV SOLN
Freq: Once | INTRAVENOUS | Status: AC
  Filled 2023-07-17: qty 250

## 2023-07-17 MED ORDER — PALONOSETRON HCL INJECTION 0.25 MG/5ML
0.2500 mg | Freq: Once | INTRAVENOUS | Status: AC
  Administered 2023-07-17: 0.25 mg via INTRAVENOUS
  Filled 2023-07-17: qty 5

## 2023-07-17 MED ORDER — SODIUM CHLORIDE 0.9 % IV SOLN
2400.0000 mg/m2 | INTRAVENOUS | Status: DC
  Administered 2023-07-17: 3000 mg via INTRAVENOUS
  Filled 2023-07-17: qty 60

## 2023-07-17 MED ORDER — SODIUM CHLORIDE 0.9 % IV SOLN
5.0000 mg/kg | Freq: Once | INTRAVENOUS | Status: AC
  Administered 2023-07-17: 200 mg via INTRAVENOUS
  Filled 2023-07-17: qty 8

## 2023-07-17 MED ORDER — SODIUM CHLORIDE 0.9 % IV SOLN
10.0000 mg | Freq: Once | INTRAVENOUS | Status: AC
  Administered 2023-07-17: 10 mg via INTRAVENOUS
  Filled 2023-07-17: qty 10

## 2023-07-17 MED ORDER — OXALIPLATIN CHEMO INJECTION 100 MG/20ML
85.0000 mg/m2 | Freq: Once | INTRAVENOUS | Status: AC
  Administered 2023-07-17: 100 mg via INTRAVENOUS
  Filled 2023-07-17: qty 20

## 2023-07-17 MED ORDER — FLUOROURACIL CHEMO INJECTION 500 MG/10ML
400.0000 mg/m2 | Freq: Once | INTRAVENOUS | Status: AC
  Administered 2023-07-17: 500 mg via INTRAVENOUS
  Filled 2023-07-17: qty 10

## 2023-07-17 MED ORDER — FAMOTIDINE IN NACL 20-0.9 MG/50ML-% IV SOLN
20.0000 mg | Freq: Once | INTRAVENOUS | Status: AC
  Administered 2023-07-17: 20 mg via INTRAVENOUS
  Filled 2023-07-17: qty 50

## 2023-07-17 MED ORDER — DEXTROSE 5 % IV SOLN
Freq: Once | INTRAVENOUS | Status: AC
  Filled 2023-07-17: qty 250

## 2023-07-17 MED ORDER — LEUCOVORIN CALCIUM INJECTION 350 MG
400.0000 mg/m2 | Freq: Once | INTRAVENOUS | Status: AC
  Administered 2023-07-17: 496 mg via INTRAVENOUS
  Filled 2023-07-17: qty 24.8

## 2023-07-17 NOTE — Progress Notes (Signed)
Patient states that she is doing well, no changes since her last visit. She did have a CT scan on 07/10/2023.

## 2023-07-17 NOTE — Progress Notes (Unsigned)
Cardiology Office Note Date:  07/18/2023  Patient ID:  Veronica Boyd 08-27-1946, MRN [id number] PCP:  Barbette Reichmann, MD  Cardiologist:  Julien Nordmann, MD Electrophysiologist: None    Chief Complaint: afib eval  History of Present Illness: Veronica Boyd is a 77 y.o. female with PMH notable for parox Afib, rectal cancer; seen today for EP evaluation.  She saw NP Brion Aliment 06/2023. Recent zio monitors showed 13-14% afib burden. She had been recommended to take digoxin, but had increased cough and she was concerned for dig toxicity so stopped. Low dose BB had been previously started. At appt with Brion Aliment, she did not have palpitations or chest pain, but did c/o progressive DOE and was agreeable to [employer] for further eval, which was completed earlier this week and showed a small, fixed defect that could be artifact or scar; no significant ischemia identified.   On follow-up today, she states that her breathing is much improved. She used to be winded and have to rest after 5-10 steps, she is now able to walk all the way from the waiting room to the exam room without symptoms. She was recently started on megace and has had increased appetite and has gained some weight. She continues to deny palpitations, chest pain, chest pressure, no dizziness, syncope or presyncope.   She continues to take eliquis 5mg  BID, no bleeding concerns.   AAD History: none  Past Medical History:  Diagnosis Date   Anemia due to antineoplastic chemotherapy    Arthritis    Atrial flutter (HCC)    Bronchial obstruction    Cataract    bilateral   Chemotherapy-induced neuropathy (HCC)    Chicken pox    Colon polyp    Coronary artery calcification seen on CT scan    a. 11/2022 CT Chest: Coronary artery calcification and aortic atherosclerosis.   Follicular lymphoma (HCC) 08/2016   lymph nodes    GERD (gastroesophageal reflux disease)    History of echocardiogram    a. 04/2023 Echo: EF >55%, nl RV  size/fxn.  No significant valvular disease observed.   Hyperlipidemia    Lung nodules 09/2022   right upper lobe   Mass of right chest wall    Osteoporosis    PAF (paroxysmal atrial fibrillation) (HCC)    a. 04/2023 Zio: Monitor 1 - 16 % aflutter burden w/ avg rate of 168, (78-226), possible Atach. Freq PACs, rare PVCs. Monitor 2 - 15% afib/flutter burden @ 115 [phone]). Poss Atach. 4 beats NSVT. Freq PACs, rare PVCs; b. CHA2DS2VASc = 4-->eliquis.   Protein-calorie malnutrition, severe (HCC)    Rectal carcinoma (HCC)    Thrombocytopenia (HCC)     Past Surgical History:  Procedure Laterality Date   AXILLARY LYMPH NODE DISSECTION Right 08/21/2016   Procedure: AXILLARY LYMPH NODE excision;  Surgeon: Nadeen Landau, MD;  Location: [facility];  Service: General;  Laterality: Right;   CATARACT EXTRACTION, BILATERAL Bilateral    COLONOSCOPY N/A 09/19/2020   Procedure: COLONOSCOPY;  Surgeon: Regis Bill, MD;  Location: [facility];  Service: Endoscopy;  Laterality: N/A;   PORTA CATH INSERTION N/A 09/16/2017   Procedure: PORTA CATH INSERTION;  Surgeon: Annice Needy, MD;  Location: [facility];  Service: Cardiovascular;  Laterality: N/A;   TONSILLECTOMY     TOTAL HIP ARTHROPLASTY Left 1992    Current Outpatient Medications  Medication Instructions   apixaban (ELIQUIS) 5 mg, Oral, 2 times daily   diclofenac sodium (VOLTAREN) 2 g, Topical,  2 times daily PRN   diphenoxylate-atropine (LOMOTIL) 2.5-0.025 MG tablet 2 tablets, Oral, 4 times daily PRN   gabapentin (NEURONTIN) 300 mg, Oral, 2 times daily   lidocaine-prilocaine (EMLA) cream 1 Application, Topical, As needed, Apply to port 1 hour prior to use as needed   megestrol (MEGACE) 40 mg, Oral, Daily   metoprolol succinate (TOPROL XL) 12.5 mg, Oral, Daily at bedtime   ondansetron (ZOFRAN) 8 mg, Oral, Every 8 hours PRN, Start on the third day after chemotherapy.   pantoprazole (PROTONIX) 20 MG tablet 1 tablet, Oral, Daily  PRN   Potassium 99 MG TABS 1 tablet, Oral, As needed   simvastatin (ZOCOR) 20 mg, Oral, Daily at bedtime   tiZANidine (ZANAFLEX) 2 MG tablet TAKE 1 TABLET BY MOUTH NIGHTLY AS NEEDED FOR MUSCLE SPASMS.   traMADol-acetaminophen (ULTRACET) 37.5-325 MG tablet 1 tablet, Oral, Every 8 hours PRN    Social History:  The patient  reports that she has never smoked. She has never used smokeless tobacco. She reports that she does not drink alcohol and does not use drugs.   Family History:  The patient's family history includes Basal cell carcinoma in her daughter; Breast cancer in her paternal aunt; Diabetes in her brother and sister; Emphysema in her father; Heart failure in her mother; Lung cancer in her brother.  ROS:  Please see the history of present illness. All other systems are reviewed and otherwise negative.   PHYSICAL EXAM:  VS:  BP 130/80 (BP Location: Left Arm, Patient Position: Sitting, Cuff Size: Normal)   Pulse 85   Ht 4\' 10"  (1.473 m)   Wt 90 lb 6 oz (41 kg)   SpO2 97%   BMI 18.89 kg/m  BMI: Body mass index is 18.89 kg/m.  GEN- frail, thin appearing, alert and oriented x 3 today.   Lungs- Clear to ausculation bilaterally, normal work of breathing.  Heart- Regular rate and rhythm, no murmurs, rubs or gallops Extremities- No peripheral edema, warm, dry   EKG is not ordered. Personal review of EKG from  07/04/2023  shows:  ST with PACs, low voltage QRS Rate 108        Recent Labs: 11/13/2022: B Natriuretic Peptide 67.2 04/16/2023: Magnesium 1.9 07/17/2023: ALT 13; BUN 14; Creatinine 0.71; Hemoglobin 12.3; Platelet Count 151; Potassium 3.7; Sodium 136  No results found for requested labs within last 365 days.   Estimated Creatinine Clearance: 36.8 mL/min (by C-G formula based on SCr of 0.71 mg/dL).   Wt Readings from Last 3 Encounters:  07/18/23 90 lb 6 oz (41 kg)  07/17/23 89 lb (40.4 kg)  07/04/23 85 lb (38.6 kg)     Additional studies reviewed include: Previous EP,  cardiology notes.   NM myocardial, 07/16/2023   Low risk, probably normal pharmacologic myocardial perfusion stress test.   There is a small in size, severe, fixed defect in the apical lateral segment most consistent with artifact but cannot rule out small area of scar.   No significant ischemia is identified.   Left ventricular systolic function is normal (LVEF greater than 65%).   Coronary artery calcification and aortic atherosclerosis are noted.  Central venous catheter extends into the distal SVC.   Pectus excavatum is evident.  Long term monitor, 06/10/2023 Monitor 1 Normal sinus rhythm Patient had a min HR of 50 bpm, max HR of 226 bpm, and avg HR of 102 bpm.  Atrial Flutter occurred (16% burden), ranging from 78-226 bpm (avg of 126 bpm), the longest lasting  31 mins 49 secs with an avg rate of 168 bpm.  Possible atrial tachycardia Isolated SVEs were frequent (7.0%, [id number]), SVE Couplets were rare (<1.0%, 1946), and SVE Triplets were rare (<1.0%, 824).  Isolated VEs were rare (<1.0%), VE Couplets were rare (<1.0%), and no VE Triplets were present.    Monitor 2 Normal sinus rhythm Patient had a min HR of 48 bpm, max HR of 235 bpm, and avg HR of 100 bpm.   1 run of Ventricular Tachycardia occurred lasting 4 beats with a max rate of 226 bpm (avg 202 bpm).   Atrial Fibrillation/Flutter occurred (15% burden), ranging from 66-235 bpm (avg of 115 bpm), the longest lasting 22 mins 56 secs with an avg rate of 102 bpm.  Possible atrial tachycardia.  Isolated SVEs were frequent (9.0%, [id number]), SVE Couplets were occasional (1.4%, 14036), and SVE Triplets were rare (<1.0%, 6711).  Isolated VEs were rare (<1.0%), VE Couplets were rare (<1.0%), and no VE Triplets were present.   TTE, 04/08/2023  1. Left ventricular ejection fraction, by estimation, is >55%. The left ventricle has normal function. Left ventricular endocardial border not optimally defined to evaluate regional wall motion. Left ventricular  diastolic function could not be evaluated.   2. Right ventricular systolic function is normal. The right ventricular size is normal.   3. The mitral valve was not well visualized. No evidence of mitral valve  regurgitation.   4. Tricuspid valve regurgitation not well assessed.   5. The aortic valve was not well visualized. Aortic valve regurgitation not well assessed.   6. Pulmonic valve regurgitation not well assessed.   ASSESSMENT AND PLAN:  #) parox afib Recent zio showed 15-16% afib burden Previously concerned that DOE was associated with AFib, though DOE has improved Patient otherwise asymptomatic Most recent echo with preserved LVEF Long discussion with patient and daughter regarding whether to treat Afib or cont to monitor At this time, we will continue to monitor given asymptomatic and preserved LVEF in the setting of recurrent rectal cancer Continue 12.5mg  toprol daily  #) Hypercoag d/t parox afib CHA2DS2-VASc Score = 4 [CHF History: 0, HTN History: 0, Diabetes History: 0, Stroke History: 0, Vascular Disease History: 1, Age Score: 2, Gender Score: 1].  Therefore, the patient's annual risk of stroke is 4.8 %.    Stroke ppx - 5mg  eliquis BID, appropriately dosed. Will need dose reduction to 2.5mg  in 11/2023  No bleeding concerns   #) recurrent stage IV rectal cancer Follows regularly with Dr. Orlie Dakin, oncology      Current medicines are reviewed at length with the patient today.   The patient does not have concerns regarding her medicines.  The following changes were made today:  none  Labs/ tests ordered today include:  No orders of the defined types were placed in this encounter.    Disposition: Follow up with EP APP in 3 months   Signed, Sherie Don, NP  07/18/23  2:40 PM  Electrophysiology [facility]

## 2023-07-17 NOTE — Progress Notes (Signed)
[facility]  Telephone:([phone]) [phone] Fax:(336) [phone]  ID: Veronica Boyd OB: [birth year]  MR#: [id number]  AOZ#:[id number]  Patient Care Team: Barbette Reichmann, MD as PCP - General (Internal Medicine) Antonieta Iba, MD as PCP - Cardiology (Cardiology) Jeralyn Ruths, MD as Consulting Physician (Oncology) Benita Gutter, RN as Oncology Nurse Navigator Salena Saner, MD as Consulting Physician (Pulmonary Disease)  CHIEF COMPLAINT: Recurrent stage IV rectal cancer, history of follicular lymphoma.  INTERVAL HISTORY: Patient returns to clinic today for further evaluation, discussion of her imaging results, and consideration of cycle 7 of FOLFOX plus Avastin.  She currently feels well and is asymptomatic.  She is tolerating her treatments without significant side effects.  She does not complain of weakness or fatigue today. She has good appetite and is maintaining her weight. Her pain is well-controlled with tramadol.  She has a chronic peripheral neuropathy, but no other neurologic complaints.  She denies any recent fevers or illnesses.  She denies any chest pain, shortness of breath, or hemoptysis. She denies any nausea, vomiting, constipation or diarrhea.  She has no urinary complaints.  Patient offers no further specific complaints today.  REVIEW OF SYSTEMS:   Review of Systems  Constitutional: Negative.  Negative for fever, malaise/fatigue and weight loss.  Respiratory: Negative.  Negative for cough, hemoptysis and shortness of breath.   Cardiovascular: Negative.  Negative for chest pain and leg swelling.  Gastrointestinal:  Negative for abdominal pain, blood in stool, diarrhea, melena, nausea and vomiting.  Genitourinary: Negative.  Negative for dysuria.  Musculoskeletal: Negative.  Negative for back pain, joint pain and neck pain.  Skin: Negative.  Negative for rash.  Neurological:  Positive for tingling and sensory change. Negative for focal  weakness, weakness and headaches.  Psychiatric/Behavioral: Negative.  The patient is not nervous/anxious and does not have insomnia.     As per HPI. Otherwise, a complete review of systems is negative.   PAST MEDICAL HISTORY: Past Medical History:  Diagnosis Date   Anemia due to antineoplastic chemotherapy    Arthritis    Atrial flutter (HCC)    Bronchial obstruction    Cataract    bilateral   Chemotherapy-induced neuropathy (HCC)    Chicken pox    Colon polyp    Coronary artery calcification seen on CT scan    a. 11/2022 CT Chest: Coronary artery calcification and aortic atherosclerosis.   Follicular lymphoma (HCC) 08/2016   lymph nodes    GERD (gastroesophageal reflux disease)    History of echocardiogram    a. 04/2023 Echo: EF >55%, nl RV size/fxn.  No significant valvular disease observed.   Hyperlipidemia    Lung nodules 09/2022   right upper lobe   Mass of right chest wall    Osteoporosis    PAF (paroxysmal atrial fibrillation) (HCC)    a. 04/2023 Zio: Monitor 1 - 16 % aflutter burden w/ avg rate of 168, (78-226), possible Atach. Freq PACs, rare PVCs. Monitor 2 - 15% afib/flutter burden @ 115 [phone]). Poss Atach. 4 beats NSVT. Freq PACs, rare PVCs; b. CHA2DS2VASc = 4-->eliquis.   Protein-calorie malnutrition, severe (HCC)    Rectal carcinoma (HCC)    Thrombocytopenia (HCC)     PAST SURGICAL HISTORY: Past Surgical History:  Procedure Laterality Date   AXILLARY LYMPH NODE DISSECTION Right 08/21/2016   Procedure: AXILLARY LYMPH NODE excision;  Surgeon: Nadeen Landau, MD;  Location: [facility] ORS;  Service: General;  Laterality: Right;   CATARACT EXTRACTION, BILATERAL Bilateral  COLONOSCOPY N/A 09/19/2020   Procedure: COLONOSCOPY;  Surgeon: Regis Bill, MD;  Location: [facility] ENDOSCOPY;  Service: Endoscopy;  Laterality: N/A;   PORTA CATH INSERTION N/A 09/16/2017   Procedure: PORTA CATH INSERTION;  Surgeon: Annice Needy, MD;  Location: [facility] INVASIVE CV LAB;   Service: Cardiovascular;  Laterality: N/A;   TONSILLECTOMY     TOTAL HIP ARTHROPLASTY Left 1992    FAMILY HISTORY: Family History  Problem Relation Age of Onset   Heart failure Mother    Emphysema Father    Diabetes Sister    Lung cancer Brother    Diabetes Brother    Breast cancer Paternal Aunt    Basal cell carcinoma Daughter     ADVANCED DIRECTIVES (Y/N):  N  HEALTH MAINTENANCE: Social History   Tobacco Use   Smoking status: Never   Smokeless tobacco: Never  Vaping Use   Vaping status: Never Used  Substance Use Topics   Alcohol use: No   Drug use: No     Colonoscopy:  PAP:  Bone density:  Lipid panel:  No Known Allergies  Current Outpatient Medications  Medication Sig Dispense Refill   apixaban (ELIQUIS) 5 MG TABS tablet Take 1 tablet (5 mg total) by mouth 2 (two) times daily. 180 tablet 3   diclofenac sodium (VOLTAREN) 1 % GEL Apply 2 g topically 2 (two) times daily as needed.     diphenoxylate-atropine (LOMOTIL) 2.5-0.025 MG tablet TAKE 2 TABLETS BY MOUTH 4 (FOUR) TIMES DAILY AS NEEDED FOR DIARRHEA OR LOOSE STOOLS. 60 tablet 1   gabapentin (NEURONTIN) 300 MG capsule TAKE 1 CAPSULE BY MOUTH TWICE A DAY 60 capsule 2   lidocaine-prilocaine (EMLA) cream Apply 1 Application topically as needed. Apply to port 1 hour prior to use as needed 30 g 2   megestrol (MEGACE) 40 MG tablet Take 1 tablet (40 mg total) by mouth daily. 90 tablet 1   metoprolol succinate (TOPROL XL) 25 MG 24 hr tablet Take 0.5 tablets (12.5 mg total) by mouth at bedtime. 30 tablet 2   ondansetron (ZOFRAN) 8 MG tablet Take 1 tablet (8 mg total) by mouth every 8 (eight) hours as needed for nausea or vomiting. Start on the third day after chemotherapy. 60 tablet 2   pantoprazole (PROTONIX) 20 MG tablet Take 1 tablet by mouth daily as needed for heartburn.     Potassium 99 MG TABS Take 1 tablet by mouth as needed.     simvastatin (ZOCOR) 20 MG tablet Take 20 mg by mouth at bedtime.     tiZANidine  (ZANAFLEX) 2 MG tablet TAKE 1 TABLET BY MOUTH NIGHTLY AS NEEDED FOR MUSCLE SPASMS. 90 tablet 1   traMADol-acetaminophen (ULTRACET) 37.5-325 MG tablet Take 1 tablet by mouth every 8 (eight) hours as needed.     digoxin (LANOXIN) 0.125 MG tablet Take 1 tablet (0.125 mg total) by mouth daily. (Patient not taking: Reported on 06/19/2023) 90 tablet 3   No current facility-administered medications for this visit.   Facility-Administered Medications Ordered in Other Visits  Medication Dose Route Frequency Provider Last Rate Last Admin   fluorouracil (ADRUCIL) 3,000 mg in sodium chloride 0.9 % 90 mL chemo infusion  2,400 mg/m2 (Treatment Plan Recorded) Intravenous 1 day or 1 dose Orlie Dakin, Tollie Pizza, MD       fluorouracil (ADRUCIL) chemo injection 500 mg  400 mg/m2 (Treatment Plan Recorded) Intravenous Once Jeralyn Ruths, MD       heparin lock flush 100 unit/mL  500 Units Intravenous  Once Jeralyn Ruths, MD       heparin lock flush 100 unit/mL  500 Units Intracatheter PRN Jeralyn Ruths, MD       heparin lock flush 100 unit/mL  500 Units Intravenous Once Jeralyn Ruths, MD       heparin lock flush 100 unit/mL  500 Units Intravenous Once Jeralyn Ruths, MD       leucovorin 496 mg in dextrose 5 % 250 mL infusion  400 mg/m2 (Treatment Plan Recorded) Intravenous Once Jeralyn Ruths, MD 137 mL/hr at 07/17/23 1102 496 mg at 07/17/23 1102   oxaliplatin (ELOXATIN) 100 mg in dextrose 5 % 500 mL chemo infusion  85 mg/m2 (Treatment Plan Recorded) Intravenous Once Jeralyn Ruths, MD 260 mL/hr at 07/17/23 1103 100 mg at 07/17/23 1103   sodium chloride flush (NS) 0.9 % injection 10 mL  10 mL Intravenous PRN Jeralyn Ruths, MD   10 mL at 12/04/17 0901   sodium chloride flush (NS) 0.9 % injection 10 mL  10 mL Intracatheter PRN Jeralyn Ruths, MD       sodium chloride flush (NS) 0.9 % injection 10 mL  10 mL Intravenous PRN Jeralyn Ruths, MD   10 mL at 04/24/21 0839    sodium chloride flush (NS) 0.9 % injection 10 mL  10 mL Intracatheter PRN Jeralyn Ruths, MD   10 mL at 04/12/23 1419   yttrium-90 injection 22.3 millicurie  22.3 millicurie Intravenous Once Carmina Miller, MD        OBJECTIVE: Vitals:   07/17/23 0843  BP: 117/81  Pulse: 64  Temp: (!) 97.1 F (36.2 C)  SpO2: 100%        Body mass index is 18.6 kg/m.    ECOG FS:0 - Asymptomatic  General: Well-developed, well-nourished, no acute distress. Eyes: Pink conjunctiva, anicteric sclera. HEENT: Normocephalic, moist mucous membranes. Lungs: No audible wheezing or coughing. Heart: Regular rate and rhythm. Abdomen: Soft, nontender, no obvious distention. Musculoskeletal: No edema, cyanosis, or clubbing. Neuro: Alert, answering all questions appropriately. Cranial nerves grossly intact. Skin: No rashes or petechiae noted. Psych: Normal affect.  LAB RESULTS:  Lab Results  Component Value Date   NA 136 07/17/2023   K 3.7 07/17/2023   CL 104 07/17/2023   CO2 24 07/17/2023   GLUCOSE 100 (H) 07/17/2023   BUN 14 07/17/2023   CREATININE 0.71 07/17/2023   CALCIUM 8.9 07/17/2023   PROT 6.9 07/17/2023   ALBUMIN 3.8 07/17/2023   AST 23 07/17/2023   ALT 13 07/17/2023   ALKPHOS 69 07/17/2023   BILITOT 0.3 07/17/2023   GFRNONAA >60 07/17/2023   GFRAA >60 06/28/2020    Lab Results  Component Value Date   WBC 6.7 07/17/2023   NEUTROABS 3.4 07/17/2023   HGB 12.3 07/17/2023   HCT 39.0 07/17/2023   MCV 105.1 (H) 07/17/2023   PLT 151 07/17/2023     STUDIES: NM Myocar Multi W/Spect W/Wall Motion / EF  Result Date: 07/17/2023   Low risk, probably normal pharmacologic myocardial perfusion stress test.   There is a small in size, severe, fixed defect in the apical lateral segment most consistent with artifact but cannot rule out small area of scar.   No significant ischemia is identified.   Left ventricular systolic function is normal (LVEF greater than 65%).   Coronary artery  calcification and aortic atherosclerosis are noted.  Central venous catheter extends into the distal SVC.   Pectus excavatum is evident.  CT CHEST ABDOMEN PELVIS W CONTRAST  Result Date: 07/13/2023 CLINICAL DATA:  Rectal cancer, assess treatment response * Tracking Code: BO * EXAM: CT CHEST, ABDOMEN, AND PELVIS WITH CONTRAST TECHNIQUE: Multidetector CT imaging of the chest, abdomen and pelvis was performed following the standard protocol during bolus administration of intravenous contrast. RADIATION DOSE REDUCTION: This exam was performed according to the departmental dose-optimization program which includes automated exposure control, adjustment of the mA and/or kV according to patient size and/or use of iterative reconstruction technique. CONTRAST:  75mL OMNIPAQUE IOHEXOL 300 MG/ML SOLN additional oral enteric contrast COMPARISON:  PET-CT, 03/21/2023 FINDINGS: CT CHEST FINDINGS Cardiovascular: Right chest port catheter. Aortic atherosclerosis. Normal heart size. Left coronary artery calcifications. No pericardial effusion. Mediastinum/Nodes: No enlarged mediastinal, hilar, or axillary lymph nodes. Thyroid gland, trachea, and esophagus demonstrate no significant findings. Lungs/Pleura: Diminished size of an infrahilar mass of the right lower lobe, measuring 4.5 x 3.5 cm, previously 6.0 x 4.6 cm when measured similarly (series 4, image 78). Significant interval enlargement of a spiculated nodule of the dependent left lower lobe, measuring 1.9 x 1.4 cm, previously 1.0 cm (series 4, image 104). Diminished size or resolution of other nodules,, for example a nodule of the dependent right lower lobe measuring 0.6 cm, previously 1.1 cm (series 4, image 114). No pleural effusion or pneumothorax. Musculoskeletal: Pectus deformity.  No acute osseous findings. CT ABDOMEN PELVIS FINDINGS Hepatobiliary: No solid liver abnormality is seen. Contracted gallbladder. No gallstones, gallbladder wall thickening, or biliary  dilatation. Pancreas: Unremarkable. No pancreatic ductal dilatation or surrounding inflammatory changes. Spleen: Normal in size without significant abnormality. Adrenals/Urinary Tract: Adrenal glands are unremarkable. Kidneys are normal, without renal calculi, solid lesion, or hydronephrosis. Bladder is unremarkable. Stomach/Bowel: Stomach is within normal limits. Appendix not clearly visualized somewhat diminished circumferential wall thickening of the rectum, assessment limited by dense metallic streak artifact from adjacent hip arthroplasty (series 2, image 104). Vascular/Lymphatic: Aortic atherosclerosis. No enlarged abdominal or pelvic lymph nodes. 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 No ascites. Musculoskeletal: No acute osseous findings. IMPRESSION: 1. Diminished size of an infrahilar mass of the right lower lobe. 2. Significant interval enlargement of a spiculated nodule of the dependent left lower lobe. 3. Diminished size or resolution of other nodules. 4. Somewhat diminished circumferential wall thickening of the rectum. 5. Constellation of findings is consistent with mixed response to treatment. As previously reported, both primary lung malignancy and pulmonary metastatic disease remain differential considerations unless tissue diagnosis has been established. 6. No evidence of lymphadenopathy or metastatic disease in the abdomen or pelvis. 7. 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 8. Coronary artery disease. Aortic Atherosclerosis (ICD10-I70.0). Electronically Signed   By: Jearld Lesch M.D.   On: 07/13/2023 21:18    ONCOLOGY HISTORY:  Biopsy confirmed rectal cancer.  MRI completed at [facility] on October 11, 2020 revealed extension of the tumor beyond the wall into the rectovaginal recess with suspected invasion of the vaginal  wall posteriorly.  Tumor also appears to involve the internal anal sphincter.  There are also numerous prominent presacral and mesorectal lymph nodes highly suspicious for malignancy.  PET scan results from November 17, 2020 reviewed independently with 3 hypermetabolic right lower lobe lung nodules consistent with pulmonary metastasis.  Patient completed cycle 8 of FOLFOX on Mar 29, 2021.  She then completed cycle 5 of her 5-FU pump on May 26, 2021 and XRT on June 01, 2021.  PET scan results from July 17, 2021 reviewed independently with no evidence of disease other than enlarging right lower lobe lung lesion.  Patient completed SBRT to the right lung lesion on August 22, 2021.  Repeat PET scan on July 25, 2021 revealed minimal residual disease in the rectum and likely resolution of lesions in the lung.    ASSESSMENT: Recurrent stage IV rectal cancer, history of follicular lymphoma.  PLAN:    Recurrent stage IV rectal cancer: See oncology history as above.  PET scan results from Mar 21, 2023 reviewed independently with clear progression of disease in patient's pelvis as well as multiple pulmonary nodules.  Her CEA initially increased to 29.8, but now has trended down to 13.2.  Hospice and end-of-life care were previously discussed, but patient preferred to pursue treatment.  CT scan results July 13, 2023 reviewed independently and reported as above with overall improvement of disease, although patient has a left lower lobe lesion that has increased in size.  Proceed with cycle 7 of FOLFOX plus Avastin today.  Return to clinic in 2 days for pump removal and Udenyca.  Patient would then return to clinic in 2 weeks for further evaluation and consideration of cycle 8.   Left lower lobe lung lesion: Increasing in size despite improvement of disease elsewhere.  Plan to discuss at tumor board on Thursday.  Patient will likely need a referral to pulmonary with biopsy. Recurrent follicular lymphoma: CT  scan results from December 23, 2019 reviewed independently with no obvious evidence of recurrent or progressive disease.  PET scan results as above consistent with rectal cancer metastasis and no evidence of lymphoma.   Patient received Zevalin on July 30, 2017 with not much therapeutic effect.  She subsequently underwent cycles 5 of R-CHOP chemotherapy with Neulasta support completing on December 12, 2017.  Patient continues to be in complete remission.  Neuropathy: Chronic and unchanged. Pelvic pain: Well-controlled with tramadol. Poor appetite/weight loss: Resolved.  Continue Megace as prescribed. Neutropenia: Resolved.  Continue Udenyca with pump removal. Anemia: Resolved. Thrombocytopenia: Resolved. Hypokalemia: Resolved.  Patient expressed understanding and was in agreement with this plan. She also understands that She can call clinic at any time with any questions, concerns, or complaints.    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

## 2023-07-17 NOTE — Patient Instructions (Signed)
[place] [facility]  Discharge Instructions: Thank you for choosing [facility] to provide your oncology and hematology care.  If you have a lab appointment with the Cancer Center, please go directly to the Cancer Center and check in at the registration area.  Wear comfortable clothing and clothing appropriate for easy access to any Portacath or PICC line.   We strive to give you quality time with your provider. You may need to reschedule your appointment if you arrive late (15 or more minutes).  Arriving late affects you and other patients whose appointments are after yours.  Also, if you miss three or more appointments without notifying the office, you may be dismissed from the clinic at the provider's discretion.      For prescription refill requests, have your pharmacy contact our office and allow 72 hours for refills to be completed.    Today you received the following chemotherapy and/or immunotherapy agents MVASI. OXALIPLATIN, LEUCOVORIN, 5 FU      To help prevent nausea and vomiting after your treatment, we encourage you to take your nausea medication as directed.  BELOW ARE SYMPTOMS THAT SHOULD BE REPORTED IMMEDIATELY: *FEVER GREATER THAN 100.4 F (38 C) OR HIGHER *CHILLS OR SWEATING *NAUSEA AND VOMITING THAT IS NOT CONTROLLED WITH YOUR NAUSEA MEDICATION *UNUSUAL SHORTNESS OF BREATH *UNUSUAL BRUISING OR BLEEDING *URINARY PROBLEMS (pain or burning when urinating, or frequent urination) *BOWEL PROBLEMS (unusual diarrhea, constipation, pain near the anus) TENDERNESS IN MOUTH AND THROAT WITH OR WITHOUT PRESENCE OF ULCERS (sore throat, sores in mouth, or a toothache) UNUSUAL RASH, SWELLING OR PAIN  UNUSUAL VAGINAL DISCHARGE OR ITCHING   Items with * indicate a potential emergency and should be followed up as soon as possible or go to the Emergency Department if any problems should occur.  Please show the CHEMOTHERAPY ALERT CARD or IMMUNOTHERAPY  ALERT CARD at check-in to the Emergency Department and triage nurse.  Should you have questions after your visit or need to cancel or reschedule your appointment, please contact [place] [facility]  [phone] and follow the prompts.  Office hours are 8:00 a.m. to 4:30 p.m. Monday - Friday. Please note that voicemails left after 4:00 p.m. may not be returned until the following business day.  We are closed weekends and major holidays. You have access to a nurse at all times for urgent questions. Please call the main number to the clinic [phone] and follow the prompts.  For any non-urgent questions, you may also contact your provider using MyChart. We now offer e-Visits for anyone 65 and older to request care online for non-urgent symptoms. For details visit [email or web address].   Also download the MyChart app! Go to the app store, search "MyChart", open the app, select [place], and log in with your MyChart username and password.   Bevacizumab Injection What is this medication? BEVACIZUMAB (be va SIZ yoo mab) treats some types of cancer. It works by blocking a protein that causes cancer cells to grow and multiply. This helps to slow or stop the spread of cancer cells. It is a monoclonal antibody. This medicine may be used for other purposes; ask your health care provider or pharmacist if you have questions. COMMON BRAND NAME(S): Alymsys, Avastin, MVASI, Omer Jack What should I tell my care team before I take this medication? They need to know if you have any of these conditions: Blood clots Coughing up blood Having or recent surgery Heart failure High blood  pressure History of a connection between 2 or more body parts that do not usually connect (fistula) History of a tear in your stomach or intestines Protein in your urine An unusual or allergic reaction to bevacizumab, other medications, foods, dyes, or preservatives Pregnant or trying to get  pregnant Breast-feeding How should I use this medication? This medication is injected into a vein. It is given by your care team in a hospital or clinic setting. Talk to your care team the use of this medication in children. Special care may be needed. Overdosage: If you think you have taken too much of this medicine contact a poison control center or emergency room at once. NOTE: This medicine is only for you. Do not share this medicine with others. What if I miss a dose? Keep appointments for follow-up doses. It is important not to miss your dose. Call your care team if you are unable to keep an appointment. What may interact with this medication? Interactions are not expected. This list may not describe all possible interactions. Give your health care provider a list of all the medicines, herbs, non-prescription drugs, or dietary supplements you use. Also tell them if you smoke, drink alcohol, or use illegal drugs. Some items may interact with your medicine. What should I watch for while using this medication? Your condition will be monitored carefully while you are receiving this medication. You may need blood work while taking this medication. This medication may make you feel generally unwell. This is not uncommon as chemotherapy can affect healthy cells as well as cancer cells. Report any side effects. Continue your course of treatment even though you feel ill unless your care team tells you to stop. This medication may increase your risk to bruise or bleed. Call your care team if you notice any unusual bleeding. Before having surgery, talk to your care team to make sure it is ok. This medication can increase the risk of poor healing of your surgical site or wound. You will need to stop this medication for 28 days before surgery. After surgery, wait at least 28 days before restarting this medication. Make sure the surgical site or wound is healed enough before restarting this medication. Talk  to your care team if questions. Talk to your care team if you may be pregnant. Serious birth defects can occur if you take this medication during pregnancy and for 6 months after the last dose. Contraception is recommended while taking this medication and for 6 months after the last dose. Your care team can help you find the option that works for you. Do not breastfeed while taking this medication and for 6 months after the last dose. This medication can cause infertility. Talk to your care team if you are concerned about your fertility. What side effects may I notice from receiving this medication? Side effects that you should report to your care team as soon as possible: Allergic reactions--skin rash, itching, hives, swelling of the face, lips, tongue, or throat Bleeding--bloody or black, tar-like stools, vomiting blood or brown material that looks like coffee grounds, red or dark brown urine, small red or purple spots on skin, unusual bruising or bleeding Blood clot--pain, swelling, or warmth in the leg, shortness of breath, chest pain Heart attack--pain or tightness in the chest, shoulders, arms, or jaw, nausea, shortness of breath, cold or clammy skin, feeling faint or lightheaded Heart failure--shortness of breath, swelling of the ankles, feet, or hands, sudden weight gain, unusual weakness or fatigue Increase in  blood pressure Infection--fever, chills, cough, sore throat, wounds that don't heal, pain or trouble when passing urine, general feeling of discomfort or being unwell Infusion reactions--chest pain, shortness of breath or trouble breathing, feeling faint or lightheaded Kidney injury--decrease in the amount of urine, swelling of the ankles, hands, or feet Stomach pain that is severe, does not go away, or gets worse Stroke--sudden numbness or weakness of the face, arm, or leg, trouble speaking, confusion, trouble walking, loss of balance or coordination, dizziness, severe headache,  change in vision Sudden and severe headache, confusion, change in vision, seizures, which may be signs of posterior reversible encephalopathy syndrome (PRES) Side effects that usually do not require medical attention (report to your care team if they continue or are bothersome): Back pain Change in taste Diarrhea Dry skin Increased tears Nosebleed This list may not describe all possible side effects. Call your doctor for medical advice about side effects. You may report side effects to FDA at 1-800-FDA-1088. Where should I keep my medication? This medication is given in a hospital or clinic. It will not be stored at home. NOTE: This sheet is a summary. It may not cover all possible information. If you have questions about this medicine, talk to your doctor, pharmacist, or health care provider.  2024 Elsevier/Gold Standard (2022-03-09 00:00:00)  Oxaliplatin Injection What is this medication? OXALIPLATIN (ox AL i PLA tin) treats colorectal cancer. It works by slowing down the growth of cancer cells. This medicine may be used for other purposes; ask your health care provider or pharmacist if you have questions. COMMON BRAND NAME(S): Eloxatin What should I tell my care team before I take this medication? They need to know if you have any of these conditions: Heart disease History of irregular heartbeat or rhythm Liver disease Low blood cell levels (white cells, red cells, and platelets) Lung or breathing disease, such as asthma Take medications that treat or prevent blood clots Tingling of the fingers, toes, or other nerve disorder An unusual or allergic reaction to oxaliplatin, other medications, foods, dyes, or preservatives If you or your partner are pregnant or trying to get pregnant Breast-feeding How should I use this medication? This medication is injected into a vein. It is given by your care team in a hospital or clinic setting. Talk to your care team about the use of this  medication in children. Special care may be needed. Overdosage: If you think you have taken too much of this medicine contact a poison control center or emergency room at once. NOTE: This medicine is only for you. Do not share this medicine with others. What if I miss a dose? Keep appointments for follow-up doses. It is important not to miss a dose. Call your care team if you are unable to keep an appointment. What may interact with this medication? Do not take this medication with any of the following: Cisapride Dronedarone Pimozide Thioridazine This medication may also interact with the following: Aspirin and aspirin-like medications Certain medications that treat or prevent blood clots, such as warfarin, apixaban, dabigatran, and rivaroxaban Cisplatin Cyclosporine Diuretics Medications for infection, such as acyclovir, adefovir, amphotericin B, bacitracin, cidofovir, foscarnet, ganciclovir, gentamicin, pentamidine, vancomycin NSAIDs, medications for pain and inflammation, such as ibuprofen or naproxen Other medications that cause heart rhythm changes Pamidronate Zoledronic acid This list may not describe all possible interactions. Give your health care provider a list of all the medicines, herbs, non-prescription drugs, or dietary supplements you use. Also tell them if you smoke, drink alcohol,  or use illegal drugs. Some items may interact with your medicine. What should I watch for while using this medication? Your condition will be monitored carefully while you are receiving this medication. You may need blood work while taking this medication. This medication may make you feel generally unwell. This is not uncommon as chemotherapy can affect healthy cells as well as cancer cells. Report any side effects. Continue your course of treatment even though you feel ill unless your care team tells you to stop. This medication may increase your risk of getting an infection. Call your care  team for advice if you get a fever, chills, sore throat, or other symptoms of a cold or flu. Do not treat yourself. Try to avoid being around people who are sick. Avoid taking medications that contain aspirin, acetaminophen, ibuprofen, naproxen, or ketoprofen unless instructed by your care team. These medications may hide a fever. Be careful brushing or flossing your teeth or using a toothpick because you may get an infection or bleed more easily. If you have any dental work done, tell your dentist you are receiving this medication. This medication can make you more sensitive to cold. Do not drink cold drinks or use ice. Cover exposed skin before coming in contact with cold temperatures or cold objects. When out in cold weather wear warm clothing and cover your mouth and nose to warm the air that goes into your lungs. Tell your care team if you get sensitive to the cold. Talk to your care team if you or your partner are pregnant or think either of you might be pregnant. This medication can cause serious birth defects if taken during pregnancy and for 9 months after the last dose. A negative pregnancy test is required before starting this medication. A reliable form of contraception is recommended while taking this medication and for 9 months after the last dose. Talk to your care team about effective forms of contraception. Do not father a child while taking this medication and for 6 months after the last dose. Use a condom while having sex during this time period. Do not breastfeed while taking this medication and for 3 months after the last dose. This medication may cause infertility. Talk to your care team if you are concerned about your fertility. What side effects may I notice from receiving this medication? Side effects that you should report to your care team as soon as possible: Allergic reactions--skin rash, itching, hives, swelling of the face, lips, tongue, or throat Bleeding--bloody or black,  tar-like stools, vomiting blood or brown material that looks like coffee grounds, red or dark brown urine, small red or purple spots on skin, unusual bruising or bleeding Dry cough, shortness of breath or trouble breathing Heart rhythm changes--fast or irregular heartbeat, dizziness, feeling faint or lightheaded, chest pain, trouble breathing Infection--fever, chills, cough, sore throat, wounds that don't heal, pain or trouble when passing urine, general feeling of discomfort or being unwell Liver injury--right upper belly pain, loss of appetite, nausea, light-colored stool, dark yellow or brown urine, yellowing skin or eyes, unusual weakness or fatigue Low red blood cell level--unusual weakness or fatigue, dizziness, headache, trouble breathing Muscle injury--unusual weakness or fatigue, muscle pain, dark yellow or brown urine, decrease in amount of urine Pain, tingling, or numbness in the hands or feet Sudden and severe headache, confusion, change in vision, seizures, which may be signs of posterior reversible encephalopathy syndrome (PRES) Unusual bruising or bleeding Side effects that usually do not require medical attention (  report to your care team if they continue or are bothersome): Diarrhea Nausea Pain, redness, or swelling with sores inside the mouth or throat Unusual weakness or fatigue Vomiting This list may not describe all possible side effects. Call your doctor for medical advice about side effects. You may report side effects to FDA at 1-800-FDA-1088. Where should I keep my medication? This medication is given in a hospital or clinic. It will not be stored at home. NOTE: This sheet is a summary. It may not cover all possible information. If you have questions about this medicine, talk to your doctor, pharmacist, or health care provider.  2024 Elsevier/Gold Standard (2022-08-07 00:00:00)  Leucovorin Injection What is this medication? LEUCOVORIN (loo koe VOR in) prevents side  effects from certain medications, such as methotrexate. It works by increasing folate levels. This helps protect healthy cells in your body. It may also be used to treat anemia caused by low levels of folate. It can also be used with fluorouracil, a type of chemotherapy, to treat colorectal cancer. It works by increasing the effects of fluorouracil in the body. This medicine may be used for other purposes; ask your health care provider or pharmacist if you have questions. What should I tell my care team before I take this medication? They need to know if you have any of these conditions: Anemia from low levels of vitamin B12 in the blood An unusual or allergic reaction to leucovorin, folic acid, other medications, foods, dyes, or preservatives Pregnant or trying to get pregnant Breastfeeding How should I use this medication? This medication is injected into a vein or a muscle. It is given by your care team in a hospital or clinic setting. Talk to your care team about the use of this medication in children. Special care may be needed. Overdosage: If you think you have taken too much of this medicine contact a poison control center or emergency room at once. NOTE: This medicine is only for you. Do not share this medicine with others. What if I miss a dose? Keep appointments for follow-up doses. It is important not to miss your dose. Call your care team if you are unable to keep an appointment. What may interact with this medication? Capecitabine Fluorouracil Phenobarbital Phenytoin Primidone Trimethoprim;sulfamethoxazole This list may not describe all possible interactions. Give your health care provider a list of all the medicines, herbs, non-prescription drugs, or dietary supplements you use. Also tell them if you smoke, drink alcohol, or use illegal drugs. Some items may interact with your medicine. What should I watch for while using this medication? Your condition will be monitored  carefully while you are receiving this medication. This medication may increase the side effects of 5-fluorouracil. Tell your care team if you have diarrhea or mouth sores that do not get better or that get worse. What side effects may I notice from receiving this medication? Side effects that you should report to your care team as soon as possible: Allergic reactions--skin rash, itching, hives, swelling of the face, lips, tongue, or throat This list may not describe all possible side effects. Call your doctor for medical advice about side effects. You may report side effects to FDA at 1-800-FDA-1088. Where should I keep my medication? This medication is given in a hospital or clinic. It will not be stored at home. NOTE: This sheet is a summary. It may not cover all possible information. If you have questions about this medicine, talk to your doctor, pharmacist, or health care  provider.  2024 Elsevier/Gold Standard (2022-03-27 00:00:00)  Fluorouracil Injection What is this medication? FLUOROURACIL (flure oh YOOR a sil) treats some types of cancer. It works by slowing down the growth of cancer cells. This medicine may be used for other purposes; ask your health care provider or pharmacist if you have questions. COMMON BRAND NAME(S): Adrucil What should I tell my care team before I take this medication? They need to know if you have any of these conditions: Blood disorders Dihydropyrimidine dehydrogenase (DPD) deficiency Infection, such as chickenpox, cold sores, herpes Kidney disease Liver disease Poor nutrition Recent or ongoing radiation therapy An unusual or allergic reaction to fluorouracil, other medications, foods, dyes, or preservatives If you or your partner are pregnant or trying to get pregnant Breast-feeding How should I use this medication? This medication is injected into a vein. It is administered by your care team in a hospital or clinic setting. Talk to your care team  about the use of this medication in children. Special care may be needed. Overdosage: If you think you have taken too much of this medicine contact a poison control center or emergency room at once. NOTE: This medicine is only for you. Do not share this medicine with others. What if I miss a dose? Keep appointments for follow-up doses. It is important not to miss your dose. Call your care team if you are unable to keep an appointment. What may interact with this medication? Do not take this medication with any of the following: Live virus vaccines This medication may also interact with the following: Medications that treat or prevent blood clots, such as warfarin, enoxaparin, dalteparin This list may not describe all possible interactions. Give your health care provider a list of all the medicines, herbs, non-prescription drugs, or dietary supplements you use. Also tell them if you smoke, drink alcohol, or use illegal drugs. Some items may interact with your medicine. What should I watch for while using this medication? Your condition will be monitored carefully while you are receiving this medication. This medication may make you feel generally unwell. This is not uncommon as chemotherapy can affect healthy cells as well as cancer cells. Report any side effects. Continue your course of treatment even though you feel ill unless your care team tells you to stop. In some cases, you may be given additional medications to help with side effects. Follow all directions for their use. This medication may increase your risk of getting an infection. Call your care team for advice if you get a fever, chills, sore throat, or other symptoms of a cold or flu. Do not treat yourself. Try to avoid being around people who are sick. This medication may increase your risk to bruise or bleed. Call your care team if you notice any unusual bleeding. Be careful brushing or flossing your teeth or using a toothpick because  you may get an infection or bleed more easily. If you have any dental work done, tell your dentist you are receiving this medication. Avoid taking medications that contain aspirin, acetaminophen, ibuprofen, naproxen, or ketoprofen unless instructed by your care team. These medications may hide a fever. Do not treat diarrhea with over the counter products. Contact your care team if you have diarrhea that lasts more than 2 days or if it is severe and watery. This medication can make you more sensitive to the sun. Keep out of the sun. If you cannot avoid being in the sun, wear protective clothing and sunscreen. Do not use  sun lamps, tanning beds, or tanning booths. Talk to your care team if you or your partner wish to become pregnant or think you might be pregnant. This medication can cause serious birth defects if taken during pregnancy and for 3 months after the last dose. A reliable form of contraception is recommended while taking this medication and for 3 months after the last dose. Talk to your care team about effective forms of contraception. Do not father a child while taking this medication and for 3 months after the last dose. Use a condom while having sex during this time period. Do not breastfeed while taking this medication. This medication may cause infertility. Talk to your care team if you are concerned about your fertility. What side effects may I notice from receiving this medication? Side effects that you should report to your care team as soon as possible: Allergic reactions--skin rash, itching, hives, swelling of the face, lips, tongue, or throat Heart attack--pain or tightness in the chest, shoulders, arms, or jaw, nausea, shortness of breath, cold or clammy skin, feeling faint or lightheaded Heart failure--shortness of breath, swelling of the ankles, feet, or hands, sudden weight gain, unusual weakness or fatigue Heart rhythm changes--fast or irregular heartbeat, dizziness, feeling  faint or lightheaded, chest pain, trouble breathing High ammonia level--unusual weakness or fatigue, confusion, loss of appetite, nausea, vomiting, seizures Infection--fever, chills, cough, sore throat, wounds that don't heal, pain or trouble when passing urine, general feeling of discomfort or being unwell Low red blood cell level--unusual weakness or fatigue, dizziness, headache, trouble breathing Pain, tingling, or numbness in the hands or feet, muscle weakness, change in vision, confusion or trouble speaking, loss of balance or coordination, trouble walking, seizures Redness, swelling, and blistering of the skin over hands and feet Severe or prolonged diarrhea Unusual bruising or bleeding Side effects that usually do not require medical attention (report to your care team if they continue or are bothersome): Dry skin Headache Increased tears Nausea Pain, redness, or swelling with sores inside the mouth or throat Sensitivity to light Vomiting This list may not describe all possible side effects. Call your doctor for medical advice about side effects. You may report side effects to FDA at 1-800-FDA-1088. Where should I keep my medication? This medication is given in a hospital or clinic. It will not be stored at home. NOTE: This sheet is a summary. It may not cover all possible information. If you have questions about this medicine, talk to your doctor, pharmacist, or health care provider.  2024 Elsevier/Gold Standard (2022-02-27 00:00:00)

## 2023-07-18 ENCOUNTER — Encounter: Payer: Self-pay | Admitting: Cardiology

## 2023-07-18 ENCOUNTER — Other Ambulatory Visit: Payer: Self-pay | Admitting: *Deleted

## 2023-07-18 ENCOUNTER — Other Ambulatory Visit: Payer: 59

## 2023-07-18 ENCOUNTER — Ambulatory Visit: Payer: 59 | Attending: Cardiology | Admitting: Cardiology

## 2023-07-18 ENCOUNTER — Telehealth: Payer: Self-pay | Admitting: *Deleted

## 2023-07-18 VITALS — BP 130/80 | HR 85 | Ht <= 58 in | Wt 90.4 lb

## 2023-07-18 DIAGNOSIS — I480 Paroxysmal atrial fibrillation: Secondary | ICD-10-CM | POA: Diagnosis not present

## 2023-07-18 DIAGNOSIS — D6869 Other thrombophilia: Secondary | ICD-10-CM | POA: Diagnosis not present

## 2023-07-18 DIAGNOSIS — R918 Other nonspecific abnormal finding of lung field: Secondary | ICD-10-CM

## 2023-07-18 LAB — CEA: CEA: 7.7 ng/mL — ABNORMAL HIGH (ref 0.0–4.7)

## 2023-07-18 NOTE — Patient Instructions (Signed)
Medication Instructions:  Your physician has recommended you make the following change in your medication:   STOP - digoxin (LANOXIN) 0.125 MG tablet  *If you need a refill on your cardiac medications before your next appointment, please call your pharmacy*  Lab Work: -None ordered  Testing/Procedures: -None ordered  Follow-Up: At [facility], you and your health needs are our priority.  As part of our continuing mission to provide you with exceptional heart care, we have created designated Provider Care Teams.  These Care Teams include your primary Cardiologist (physician) and Advanced Practice Providers (APPs -  Physician Assistants and Nurse Practitioners) who all work together to provide you with the care you need, when you need it.  Your next appointment:   3 month(s)  Provider:   Sherie Don, NP    Other Instructions -None

## 2023-07-18 NOTE — Telephone Encounter (Signed)
Call placed to patient and daughter Veronica Boyd regarding plan as discussed at tumor board today. Imaging was discussed at tumor board and is recommended that patient be seen by Dr. Jayme Cloud for evaluation of lung nodule and possible bronchoscopy. Patient and daughter verbalized understanding and are in agreement with plan. Referral has been placed.

## 2023-07-19 ENCOUNTER — Inpatient Hospital Stay: Payer: 59

## 2023-07-19 ENCOUNTER — Other Ambulatory Visit: Payer: Self-pay

## 2023-07-19 VITALS — BP 134/88 | HR 103 | Temp 97.3°F

## 2023-07-19 DIAGNOSIS — Z5112 Encounter for antineoplastic immunotherapy: Secondary | ICD-10-CM | POA: Diagnosis not present

## 2023-07-19 DIAGNOSIS — C20 Malignant neoplasm of rectum: Secondary | ICD-10-CM

## 2023-07-19 MED ORDER — PEGFILGRASTIM-CBQV 6 MG/0.6ML ~~LOC~~ SOSY
6.0000 mg | PREFILLED_SYRINGE | Freq: Once | SUBCUTANEOUS | Status: AC
  Administered 2023-07-19: 6 mg via SUBCUTANEOUS
  Filled 2023-07-19: qty 0.6

## 2023-07-19 MED ORDER — SODIUM CHLORIDE 0.9 % IV SOLN
Freq: Once | INTRAVENOUS | Status: AC
  Filled 2023-07-19: qty 250

## 2023-07-19 MED ORDER — HEPARIN SOD (PORK) LOCK FLUSH 100 UNIT/ML IV SOLN
500.0000 [IU] | Freq: Once | INTRAVENOUS | Status: AC | PRN
  Administered 2023-07-19: 500 [IU]
  Filled 2023-07-19: qty 5

## 2023-07-23 ENCOUNTER — Ambulatory Visit (INDEPENDENT_AMBULATORY_CARE_PROVIDER_SITE_OTHER): Payer: 59 | Admitting: Pulmonary Disease

## 2023-07-23 ENCOUNTER — Telehealth: Payer: Self-pay

## 2023-07-23 ENCOUNTER — Encounter: Payer: Self-pay | Admitting: Student in an Organized Health Care Education/Training Program

## 2023-07-23 VITALS — BP 120/70 | HR 83 | Temp 97.6°F | Ht <= 58 in | Wt 87.8 lb

## 2023-07-23 DIAGNOSIS — R911 Solitary pulmonary nodule: Secondary | ICD-10-CM

## 2023-07-23 DIAGNOSIS — C7800 Secondary malignant neoplasm of unspecified lung: Secondary | ICD-10-CM

## 2023-07-23 DIAGNOSIS — C20 Malignant neoplasm of rectum: Secondary | ICD-10-CM | POA: Diagnosis not present

## 2023-07-23 DIAGNOSIS — I480 Paroxysmal atrial fibrillation: Secondary | ICD-10-CM | POA: Diagnosis not present

## 2023-07-23 DIAGNOSIS — C8208 Follicular lymphoma grade I, lymph nodes of multiple sites: Secondary | ICD-10-CM | POA: Diagnosis not present

## 2023-07-23 NOTE — H&P (View-Only) (Signed)
Subjective:    Patient ID: INAMAE STREAM, female    DOB: 12/12/1945, 77 y.o.   MRN: [id number]  Patient Care Team: Barbette Reichmann, MD as PCP - General (Internal Medicine) Antonieta Iba, MD as PCP - Cardiology (Cardiology) Jeralyn Ruths, MD as Consulting Physician (Oncology) Benita Gutter, RN as Oncology Nurse Navigator Salena Saner, MD as Consulting Physician (Pulmonary Disease)  Chief Complaint  Patient presents with   pulmonary consult    Shortness of breath with exertion.    HPI Veronica Boyd is a 77 year old lifelong never smoker who follows up for pulmonary nodules in the setting of stage IV rectal cancer and a history of follicular lymphoma.  We last saw the patient on 14 Mar 2023 at that time no interventions were recommended.  At that time she was noted to be tachycardic and was having some issues with dyspnea worsened by tachypalpitations.  She had an EKG and a 2D echo ordered.  She started chemotherapy for her rectal cancer and was noted to have issues with bradycardia.  She was then referred to cardiology who evaluated her on the 26th of June.  The patient was noted to have paroxysmal atrial fibrillation.  She is now on Eliquis.  She is on beta-blocker.  Since these therapies were instituted she notes that her dyspnea is markedly improved.    Recall that the patient had undergone SBRT x 2 to her right lower lobe and right midlung due to what where apparent metastatic deposits.  She has also previously completed concurrent chemoradiation therapy as well as FOLFOX for locally advanced rectal cancer. The patient underwent bronchoscopy on 01 October 2022 this showed that the patient had total occlusion of the right middle lobe medial and lateral (B4/B5) subsegments with gelatinous inflammatory debris, the formal pathology revealed intense inflammatory change and calcifications.  Of note the patient had had issues with fractured teeth and there is a concern for  aspiration of the tooth remnants.  The patient had experienced intractable cough prior to the procedure. Since that bronchoscopy she has had marked improvement on her cough and now this has completely resolved.  She occasionally had difficulty taking a deep breath but this has also improved from prior.  She had a normal swallow eval on 27 December.  I suspect that her aspiration of chipped teeth occurred during sleep and patient was unaware.  This issue added complexity to her bronchoscopic procedure as it distorted her airway.  A follow-up CT chest on 9 January that showed that 2 of the nodules noted previously had resolved, her right middle lobe collapse also had resolved but the index nodule in the right lower lobe had increased in size to 2.3 cm.  She underwent repeat bronchoscopy on 28 November 2022.  This showed a more patent right lower lobe however the middle lobe subsegment had still some mucous plugging and debris and this was lavaged and suctioned till cleared.  The remainder lower lobe bronchi showed stenotic segments post radiation.  This also affected the segmentation tree for bronchoscopy.  However the lesion in question was reached and sampled and again sampling was consistent with intense inflammatory debris.  After discussion with Dr. Orlie Dakin the patient's oncologist, it was elected to treat the patient with antibiotics and follow-up with CT at a later time.  In the interim the lesions were noted to enlarge, and her rectal mass was noted to enlarge and patient has been started on chemotherapy for rectal carcinoma.  Today  she presents for evaluation of potential robotic assisted navigational bronchoscopy for diagnosis of a left lower lobe nodule.  This was discussed during Tumor Board on 12th September.  The nodules noted on the right lung have responded to chemotherapy however there is a nodule on the left lower lobe that has continued to grow despite chemotherapy.  Given the patient's complex  oncologic history biopsy should be considered.  She has not had any hemoptysis.  She has not had any fevers, chills or sweats.  No chest pain. No dyspnea.   She appears frail however she is very alert and engaging.  Patient presents with her daughter.  We discussed the findings on the CT.  Discussed that there is a possibility for percutaneous needle biopsy by IR versus robotic assisted navigational bronchoscopy.  As the patient is familiar with robotic assisted bronchoscopy and she has tolerated this well previously, she prefers to proceed with this.   Review of Systems A 10 point review of systems was performed and it is as noted above otherwise negative.   Patient Active Problem List   Diagnosis Date Noted   Paroxysmal atrial fibrillation (HCC) 07/02/2023   Hypercoagulable state due to paroxysmal atrial fibrillation (HCC) 07/02/2023   Lung nodule 10/05/2022   Bronchial obstruction 10/05/2022   Chemotherapy-induced neuropathy (HCC) 07/21/2021   Rectal cancer (HCC) 11/02/2020   Rectal mass 10/11/2020   Gastroesophageal reflux disease without esophagitis 08/25/2018   Protein-calorie malnutrition, severe 12/20/2017   Anemia    Thrombocytopenia (HCC)    Sepsis (HCC) 12/19/2017   Anemia due to antineoplastic chemotherapy 12/17/2017   Goals of care, counseling/discussion 09/19/2017   Axillary mass, right 04/03/2017   Pure hypercholesterolemia 09/10/2016   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) 07/24/2016   Mass of right chest wall 06/06/2016   Primary osteoarthritis involving multiple joints 12/14/2015   Transient insomnia 08/27/2015   Mixed hyperlipidemia 03/01/2015   Nocturnal muscle cramps 03/01/2015   Osteoporosis 11/08/2014   Primary osteoarthritis of both knees 07/05/2014   Hyperlipidemia 03/19/2014   Osteoarthritis of knee 03/19/2014   Abnormal mammogram 03/03/2013   Vitamin D deficiency 05/09/2012     Social History   Tobacco Use   Smoking status: Never   Smokeless tobacco: Never  Substance Use Topics   Alcohol use: No    No Known Allergies  Current Meds  Medication Sig   apixaban (ELIQUIS) 5 MG TABS tablet Take 1 tablet (5 mg total) by mouth 2 (two) times daily.   diclofenac sodium (VOLTAREN) 1 % GEL Apply 2 g topically 2 (two) times daily as needed.   diphenoxylate-atropine (LOMOTIL) 2.5-0.025 MG tablet TAKE 2 TABLETS BY MOUTH 4 (FOUR) TIMES DAILY AS NEEDED FOR DIARRHEA OR LOOSE STOOLS.   gabapentin (NEURONTIN) 300 MG capsule TAKE 1 CAPSULE BY MOUTH TWICE A DAY   lidocaine-prilocaine (EMLA) cream Apply 1 Application topically as needed. Apply to port 1 hour prior to use as needed   megestrol (MEGACE) 40 MG tablet Take 1 tablet (40 mg total) by mouth daily.   metoprolol succinate (TOPROL XL) 25 MG 24 hr tablet Take 0.5 tablets (12.5 mg total) by mouth at bedtime.   ondansetron (ZOFRAN) 8 MG tablet Take 1 tablet (8 mg total) by mouth every 8 (eight) hours as needed for nausea or vomiting. Start on the third day after chemotherapy.   pantoprazole (PROTONIX) 20 MG tablet Take 1 tablet by mouth daily as needed for heartburn.  Potassium 99 MG TABS Take 1 tablet by mouth as needed.   simvastatin (ZOCOR) 20 MG tablet Take 20 mg by mouth at bedtime.   tiZANidine (ZANAFLEX) 2 MG tablet TAKE 1 TABLET BY MOUTH NIGHTLY AS NEEDED FOR MUSCLE SPASMS.   traMADol-acetaminophen (ULTRACET) 37.5-325 MG tablet Take 1 tablet by mouth every 8 (eight) hours as needed.    Immunization History  Administered Date(s) Administered   Influenza-Unspecified 09/20/2014   PFIZER(Purple Top)SARS-COV-2 Vaccination 01/07/2020, 02/02/2020   Unspecified SARS-COV-2 Vaccination 02/02/2020   Zoster Recombinant(Shingrix) 11/29/2019, 03/20/2020   Zoster, Live 09/20/2014        Objective:     BP 120/70 (BP Location: Left Arm, Patient Position: Sitting, Cuff Size: Normal)   Pulse 83   Temp 97.6 F (36.4 C)  (Temporal)   Ht 4\' 10"  (1.473 m)   Wt 87 lb 12.8 oz (39.8 kg)   SpO2 97%   BMI 18.35 kg/m   SpO2: 97 % O2 Device: None (Room air)  GENERAL: Thin, elderly female, pale, presents in transport chair.  No conversational dyspnea. HEAD: Normocephalic, atraumatic.  EYES: Pupils equal, round, reactive to light.  No scleral icterus.  MOUTH: Poor dentition.  She has had 12 teeth extracted which were previously noted to be chipped and in poor repair. NECK: Supple. No thyromegaly. Trachea midline. No JVD.  No adenopathy. PULMONARY: Pectus excavatum, good air entry bilaterally.  No adventitious sounds. CARDIOVASCULAR: S1 and S2.  Regular rate and rhythm.  No rubs, murmurs or gallops heard. ABDOMEN: Scaphoid otherwise benign. MUSCULOSKELETAL: No joint deformity, no clubbing, no edema.  NEUROLOGIC: Grossly nonfocal.  Fully ambulatory.  Speech is fluent. SKIN: Intact,warm,dry. PSYCH: Mood and behavior normal.  Representative image from CT chest performed 10 July 2023 showing the enlarging left lower lobe nodule:   Assessment & Plan:     ICD-10-CM   1. Lung nodule  R91.1 CT SUPER D CHEST WO [facility] PILOT   Enlarging left lower lobe nodule Navigational bronchoscopy for diagnosis    2. 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 Paroxysmal atrial fibrillation (HCC)  I48.0    On Eliquis On metoprolol Will need to hold Eliquis 3 days prior to procedure Will have confirmation from cardiology    4. 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    Answer:   [facility]    Benefits, limitations and potential complications of the procedure were discussed with the patient/family.  Complications from bronchoscopy are rare and most often minor, but if they occur they may include breathing difficulty, vocal cord spasm, hoarseness, slight fever, vomiting, dizziness, bronchospasm, infection, low blood oxygen, bleeding from biopsy site, or an allergic reaction to medications.  It is uncommon for patients to experience other more serious complications for example: Collapsed lung requiring chest tube placement, respiratory failure, heart attack and/or cardiac arrhythmia.  Patient agrees to proceed.  Bronchoscopy procedure has been scheduled for 30 September.  Will see the patient in follow-up 4 to 6 weeks  from now. Will be in contact with the patient before during and after procedure.  She is to contact us prior to her follow-up appointment should any new difficulties arise.    Gailen Shelter, MD Advanced Bronchoscopy PCCM [facility]    *This note was dictated using voice recognition software/Dragon.  Despite best efforts to proofread, errors can occur which can change the meaning. Any transcriptional errors that result from this process are unintentional and may not be fully corrected at the time of dictation.

## 2023-07-23 NOTE — Progress Notes (Deleted)
Subjective:    Patient ID: Veronica Boyd, female    DOB: 1946-06-30, 77 y.o.   MRN: [id number]  Patient Care Team: Barbette Reichmann, MD as PCP - General (Internal Medicine) Antonieta Iba, MD as PCP - Cardiology (Cardiology) Jeralyn Ruths, MD as Consulting Physician (Oncology) Benita Gutter, RN as Oncology Nurse Navigator Salena Saner, MD as Consulting Physician (Pulmonary Disease)  Chief Complaint  Patient presents with   pulmonary consult    Shortness of breath with exertion.     HPI    Review of Systems A 10 point review of systems was performed and it is as noted above otherwise negative.   Patient Active Problem List   Diagnosis Date Noted   Paroxysmal atrial fibrillation (HCC) 07/02/2023   Hypercoagulable state due to paroxysmal atrial fibrillation (HCC) 07/02/2023   Lung nodule 10/05/2022   Bronchial obstruction 10/05/2022   Chemotherapy-induced neuropathy (HCC) 07/21/2021   Rectal cancer (HCC) 11/02/2020   Rectal mass 10/11/2020   Gastroesophageal reflux disease without esophagitis 08/25/2018   Protein-calorie malnutrition, severe 12/20/2017   Anemia    Thrombocytopenia (HCC)    Sepsis (HCC) 12/19/2017   Anemia due to antineoplastic chemotherapy 12/17/2017   Goals of care, counseling/discussion 09/19/2017   Axillary mass, right 04/03/2017   Pure hypercholesterolemia 09/10/2016   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) 07/24/2016   Mass of right chest wall 06/06/2016   Primary osteoarthritis involving multiple joints 12/14/2015   Transient insomnia 08/27/2015   Mixed hyperlipidemia 03/01/2015   Nocturnal muscle cramps 03/01/2015   Osteoporosis 11/08/2014   Primary osteoarthritis of both knees 07/05/2014   Hyperlipidemia 03/19/2014   Osteoarthritis of knee 03/19/2014   Abnormal mammogram 03/03/2013   Vitamin D deficiency 05/09/2012    Social History   Tobacco  Use   Smoking status: Never   Smokeless tobacco: Never  Substance Use Topics   Alcohol use: No    No Known Allergies  Current Meds  Medication Sig   apixaban (ELIQUIS) 5 MG TABS tablet Take 1 tablet (5 mg total) by mouth 2 (two) times daily.   diclofenac sodium (VOLTAREN) 1 % GEL Apply 2 g topically 2 (two) times daily as needed.   diphenoxylate-atropine (LOMOTIL) 2.5-0.025 MG tablet TAKE 2 TABLETS BY MOUTH 4 (FOUR) TIMES DAILY AS NEEDED FOR DIARRHEA OR LOOSE STOOLS.   gabapentin (NEURONTIN) 300 MG capsule TAKE 1 CAPSULE BY MOUTH TWICE A DAY   lidocaine-prilocaine (EMLA) cream Apply 1 Application topically as needed. Apply to port 1 hour prior to use as needed   megestrol (MEGACE) 40 MG tablet Take 1 tablet (40 mg total) by mouth daily.   metoprolol succinate (TOPROL XL) 25 MG 24 hr tablet Take 0.5 tablets (12.5 mg total) by mouth at bedtime.   ondansetron (ZOFRAN) 8 MG tablet Take 1 tablet (8 mg total) by mouth every 8 (eight) hours as needed for nausea or vomiting. Start on the third day after chemotherapy.   pantoprazole (PROTONIX) 20 MG tablet Take 1 tablet by mouth daily as needed for heartburn.   Potassium 99 MG TABS Take 1 tablet by mouth as needed.   simvastatin (ZOCOR) 20 MG tablet Take 20 mg by mouth at bedtime.   tiZANidine (ZANAFLEX) 2 MG tablet TAKE 1 TABLET BY MOUTH NIGHTLY AS NEEDED FOR MUSCLE SPASMS.   traMADol-acetaminophen (ULTRACET) 37.5-325 MG tablet Take 1 tablet by mouth every 8 (eight) hours as needed.    Immunization  History  Administered Date(s) Administered   Influenza-Unspecified 09/20/2014   PFIZER(Purple Top)SARS-COV-2 Vaccination 01/07/2020, 02/02/2020   Unspecified SARS-COV-2 Vaccination 02/02/2020   Zoster Recombinant(Shingrix) 11/29/2019, 03/20/2020   Zoster, Live 09/20/2014        Objective:     BP 120/70 (BP Location: Left Arm, Patient Position: Sitting, Cuff Size: Normal)   Pulse 83   Temp 97.6 F (36.4 C) (Temporal)   Ht 4\' 10"  (1.473  m)   Wt 87 lb 12.8 oz (39.8 kg)   SpO2 97%   BMI 18.35 kg/m   SpO2: 97 % O2 Device: None (Room air)  GENERAL: HEAD: Normocephalic, atraumatic.  EYES: Pupils equal, round, reactive to light.  No scleral icterus.  MOUTH:  NECK: Supple. No thyromegaly. Trachea midline. No JVD.  No adenopathy. PULMONARY: Good air entry bilaterally.  No adventitious sounds. CARDIOVASCULAR: S1 and S2. Regular rate and rhythm.  ABDOMEN: MUSCULOSKELETAL: No joint deformity, no clubbing, no edema.  NEUROLOGIC:  SKIN: Intact,warm,dry. PSYCH:        Assessment & Plan:   No diagnosis found.  No orders of the defined types were placed in this encounter.   No orders of the defined types were placed in this encounter.     Gailen Shelter, MD Advanced Bronchoscopy PCCM [facility]    *This note was dictated using voice recognition software/Dragon.  Despite best efforts to proofread, errors can occur which can change the meaning. Any transcriptional errors that result from this process are unintentional and may not be fully corrected at the time of dictation.

## 2023-07-23 NOTE — Patient Instructions (Signed)
We are scheduling the procedure for 30 September.  Will have a CT scan of the chest that is specialized, this will be done before the procedure.  I have sent messages to cardiology to make sure it is okay to hold your anticoagulation for 3 days.  We discussed that the procedure would have to be done under general anesthesia. The anesthesia team will discuss his part of the process with you.  Complications from the procedure itself are usually minor. One potential complication would be collapse of the lung which can occur in 2-3% of the cases. If  this happens we would have to put a small tube to relieve the collapse and you would have to spend the night in the hospital in that event.  Another possibility would be that of bleeding, this is usually taken care of during the procedure.  In the situations you may need to be observed overnight.  For the most part though, should be able to go home the same day.  Other possibilities could include that the procedure would be nondiagnostic meaning that no definitive diagnosis could be attained.  We strive to try to decrease these potential issues.  We will see on follow-up in 4 to 6 weeks time, this is a routine post procedure visit but we will stay in contact with you before, during and after the procedure.

## 2023-07-23 NOTE — Progress Notes (Signed)
Subjective:    Patient ID: Veronica Boyd, female    DOB: 12/12/1945, 77 y.o.   MRN: [id number]  Patient Care Team: Barbette Reichmann, MD as PCP - General (Internal Medicine) Antonieta Iba, MD as PCP - Cardiology (Cardiology) Jeralyn Ruths, MD as Consulting Physician (Oncology) Benita Gutter, RN as Oncology Nurse Navigator Salena Saner, MD as Consulting Physician (Pulmonary Disease)  Chief Complaint  Patient presents with   pulmonary consult    Shortness of breath with exertion.    HPI Veronica Boyd is a 77 year old lifelong never smoker who follows up for pulmonary nodules in the setting of stage IV rectal cancer and a history of follicular lymphoma.  We last saw the patient on 14 Mar 2023 at that time no interventions were recommended.  At that time she was noted to be tachycardic and was having some issues with dyspnea worsened by tachypalpitations.  She had an EKG and a 2D echo ordered.  She started chemotherapy for her rectal cancer and was noted to have issues with bradycardia.  She was then referred to cardiology who evaluated her on the 26th of June.  The patient was noted to have paroxysmal atrial fibrillation.  She is now on Eliquis.  She is on beta-blocker.  Since these therapies were instituted she notes that her dyspnea is markedly improved.    Recall that the patient had undergone SBRT x 2 to her right lower lobe and right midlung due to what where apparent metastatic deposits.  She has also previously completed concurrent chemoradiation therapy as well as FOLFOX for locally advanced rectal cancer. The patient underwent bronchoscopy on 01 October 2022 this showed that the patient had total occlusion of the right middle lobe medial and lateral (B4/B5) subsegments with gelatinous inflammatory debris, the formal pathology revealed intense inflammatory change and calcifications.  Of note the patient had had issues with fractured teeth and there is a concern for  aspiration of the tooth remnants.  The patient had experienced intractable cough prior to the procedure. Since that bronchoscopy she has had marked improvement on her cough and now this has completely resolved.  She occasionally had difficulty taking a deep breath but this has also improved from prior.  She had a normal swallow eval on 27 December.  I suspect that her aspiration of chipped teeth occurred during sleep and patient was unaware.  This issue added complexity to her bronchoscopic procedure as it distorted her airway.  A follow-up CT chest on 9 January that showed that 2 of the nodules noted previously had resolved, her right middle lobe collapse also had resolved but the index nodule in the right lower lobe had increased in size to 2.3 cm.  She underwent repeat bronchoscopy on 28 November 2022.  This showed a more patent right lower lobe however the middle lobe subsegment had still some mucous plugging and debris and this was lavaged and suctioned till cleared.  The remainder lower lobe bronchi showed stenotic segments post radiation.  This also affected the segmentation tree for bronchoscopy.  However the lesion in question was reached and sampled and again sampling was consistent with intense inflammatory debris.  After discussion with Dr. Orlie Dakin the patient's oncologist, it was elected to treat the patient with antibiotics and follow-up with CT at a later time.  In the interim the lesions were noted to enlarge, and her rectal mass was noted to enlarge and patient has been started on chemotherapy for rectal carcinoma.  Today  she presents for evaluation of potential robotic assisted navigational bronchoscopy for diagnosis of a left lower lobe nodule.  This was discussed during Tumor Board on 12th September.  The nodules noted on the right lung have responded to chemotherapy however there is a nodule on the left lower lobe that has continued to grow despite chemotherapy.  Given the patient's complex  oncologic history biopsy should be considered.  She has not had any hemoptysis.  She has not had any fevers, chills or sweats.  No chest pain. No dyspnea.   She appears frail however she is very alert and engaging.  Patient presents with her daughter.  We discussed the findings on the CT.  Discussed that there is a possibility for percutaneous needle biopsy by IR versus robotic assisted navigational bronchoscopy.  As the patient is familiar with robotic assisted bronchoscopy and she has tolerated this well previously, she prefers to proceed with this.   Review of Systems A 10 point review of systems was performed and it is as noted above otherwise negative.   Patient Active Problem List   Diagnosis Date Noted   Paroxysmal atrial fibrillation (HCC) 07/02/2023   Hypercoagulable state due to paroxysmal atrial fibrillation (HCC) 07/02/2023   Lung nodule 10/05/2022   Bronchial obstruction 10/05/2022   Chemotherapy-induced neuropathy (HCC) 07/21/2021   Rectal cancer (HCC) 11/02/2020   Rectal mass 10/11/2020   Gastroesophageal reflux disease without esophagitis 08/25/2018   Protein-calorie malnutrition, severe 12/20/2017   Anemia    Thrombocytopenia (HCC)    Sepsis (HCC) 12/19/2017   Anemia due to antineoplastic chemotherapy 12/17/2017   Goals of care, counseling/discussion 09/19/2017   Axillary mass, right 04/03/2017   Pure hypercholesterolemia 09/10/2016   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) 07/24/2016   Mass of right chest wall 06/06/2016   Primary osteoarthritis involving multiple joints 12/14/2015   Transient insomnia 08/27/2015   Mixed hyperlipidemia 03/01/2015   Nocturnal muscle cramps 03/01/2015   Osteoporosis 11/08/2014   Primary osteoarthritis of both knees 07/05/2014   Hyperlipidemia 03/19/2014   Osteoarthritis of knee 03/19/2014   Abnormal mammogram 03/03/2013   Vitamin D deficiency 05/09/2012     Social History   Tobacco Use   Smoking status: Never   Smokeless tobacco: Never  Substance Use Topics   Alcohol use: No    No Known Allergies  Current Meds  Medication Sig   apixaban (ELIQUIS) 5 MG TABS tablet Take 1 tablet (5 mg total) by mouth 2 (two) times daily.   diclofenac sodium (VOLTAREN) 1 % GEL Apply 2 g topically 2 (two) times daily as needed.   diphenoxylate-atropine (LOMOTIL) 2.5-0.025 MG tablet TAKE 2 TABLETS BY MOUTH 4 (FOUR) TIMES DAILY AS NEEDED FOR DIARRHEA OR LOOSE STOOLS.   gabapentin (NEURONTIN) 300 MG capsule TAKE 1 CAPSULE BY MOUTH TWICE A DAY   lidocaine-prilocaine (EMLA) cream Apply 1 Application topically as needed. Apply to port 1 hour prior to use as needed   megestrol (MEGACE) 40 MG tablet Take 1 tablet (40 mg total) by mouth daily.   metoprolol succinate (TOPROL XL) 25 MG 24 hr tablet Take 0.5 tablets (12.5 mg total) by mouth at bedtime.   ondansetron (ZOFRAN) 8 MG tablet Take 1 tablet (8 mg total) by mouth every 8 (eight) hours as needed for nausea or vomiting. Start on the third day after chemotherapy.   pantoprazole (PROTONIX) 20 MG tablet Take 1 tablet by mouth daily as needed for heartburn.  Potassium 99 MG TABS Take 1 tablet by mouth as needed.   simvastatin (ZOCOR) 20 MG tablet Take 20 mg by mouth at bedtime.   tiZANidine (ZANAFLEX) 2 MG tablet TAKE 1 TABLET BY MOUTH NIGHTLY AS NEEDED FOR MUSCLE SPASMS.   traMADol-acetaminophen (ULTRACET) 37.5-325 MG tablet Take 1 tablet by mouth every 8 (eight) hours as needed.    Immunization History  Administered Date(s) Administered   Influenza-Unspecified 09/20/2014   PFIZER(Purple Top)SARS-COV-2 Vaccination 01/07/2020, 02/02/2020   Unspecified SARS-COV-2 Vaccination 02/02/2020   Zoster Recombinant(Shingrix) 11/29/2019, 03/20/2020   Zoster, Live 09/20/2014        Objective:     BP 120/70 (BP Location: Left Arm, Patient Position: Sitting, Cuff Size: Normal)   Pulse 83   Temp 97.6 F (36.4 C)  (Temporal)   Ht 4\' 10"  (1.473 m)   Wt 87 lb 12.8 oz (39.8 kg)   SpO2 97%   BMI 18.35 kg/m   SpO2: 97 % O2 Device: None (Room air)  GENERAL: Thin, elderly female, pale, presents in transport chair.  No conversational dyspnea. HEAD: Normocephalic, atraumatic.  EYES: Pupils equal, round, reactive to light.  No scleral icterus.  MOUTH: Poor dentition.  She has had 12 teeth extracted which were previously noted to be chipped and in poor repair. NECK: Supple. No thyromegaly. Trachea midline. No JVD.  No adenopathy. PULMONARY: Pectus excavatum, good air entry bilaterally.  No adventitious sounds. CARDIOVASCULAR: S1 and S2.  Regular rate and rhythm.  No rubs, murmurs or gallops heard. ABDOMEN: Scaphoid otherwise benign. MUSCULOSKELETAL: No joint deformity, no clubbing, no edema.  NEUROLOGIC: Grossly nonfocal.  Fully ambulatory.  Speech is fluent. SKIN: Intact,warm,dry. PSYCH: Mood and behavior normal.  Representative image from CT chest performed 10 July 2023 showing the enlarging left lower lobe nodule:   Assessment & Plan:     ICD-10-CM   1. Lung nodule  R91.1 CT SUPER D CHEST WO MONARCH PILOT   Enlarging left lower lobe nodule Navigational bronchoscopy for diagnosis    2. 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 Paroxysmal atrial fibrillation (HCC)  I48.0    On Eliquis On metoprolol Will need to hold Eliquis 3 days prior to procedure Will have confirmation from cardiology    4. 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    Answer:   [facility]    Benefits, limitations and potential complications of the procedure were discussed with the patient/family.  Complications from bronchoscopy are rare and most often minor, but if they occur they may include breathing difficulty, vocal cord spasm, hoarseness, slight fever, vomiting, dizziness, bronchospasm, infection, low blood oxygen, bleeding from biopsy site, or an allergic reaction to medications.  It is uncommon for patients to experience other more serious complications for example: Collapsed lung requiring chest tube placement, respiratory failure, heart attack and/or cardiac arrhythmia.  Patient agrees to proceed.  Bronchoscopy procedure has been scheduled for 30 September.  Will see the patient in follow-up 4 to 6 weeks  from now. Will be in contact with the patient before during and after procedure.  She is to contact us prior to her follow-up appointment should any new difficulties arise.    Gailen Shelter, MD Advanced Bronchoscopy PCCM [facility]    *This note was dictated using voice recognition software/Dragon.  Despite best efforts to proofread, errors can occur which can change the meaning. Any transcriptional errors that result from this process are unintentional and may not be fully corrected at the time of dictation.

## 2023-07-23 NOTE — Telephone Encounter (Signed)
For the codes 96045, [id number], [id number] Prior Auth Not Required Refer # [id number]

## 2023-07-23 NOTE — Telephone Encounter (Addendum)
Robotic Bronchoscopy with EBUS 08/05/2023 12:30pm Left Lower Lobe Nodule 31627, [id number], 13244  Synetta Fail please see Bronch info.  Pre Admit- 9/24 1p-5p  Patient and daughter are aware.  Nothing further needed.

## 2023-07-24 NOTE — Telephone Encounter (Signed)
Veronica Saner, MD  Bonney Leitz, CMA Send a clearance request to cards for Eliquis hold please.       Previous Messages    ----- Message ----- From: Bunnie Domino Sent: 07/24/2023   7:14 AM EDT To: Veronica Saner, MD Subject: RE: Eliquis hold                              Hi Dr. Jayme Cloud,  We will need an official clearance request sent to our office then we will be happy to assist. I have Cc'ed our callback pool. They can reach out with details.  Thanks! Sharlene Dory, PA-C ----- Message ----- From: Veronica Saner, MD Sent: 07/23/2023  11:43 AM EDT To: [facility] Subject: Eliquis hold                                  Hello,  This patient is on Eliquis for A-fib.  She will need robotic assisted navigational bronchoscopy with biopsy on 30 September.  Will need to hold Eliquis for 3 days prior to the procedure.  Please advise.  Dr. Jayme Cloud

## 2023-07-24 NOTE — Telephone Encounter (Signed)
Clearance form has been sent to Dr. Mariah Milling.

## 2023-07-29 ENCOUNTER — Telehealth: Payer: Self-pay | Admitting: *Deleted

## 2023-07-29 MED FILL — Fluorouracil IV Soln 500 MG/10ML (50 MG/ML): INTRAVENOUS | Qty: 10 | Status: AC

## 2023-07-29 NOTE — Telephone Encounter (Signed)
Covering preop today. Time sensitive clearance flagged. Patient just recently saw Sherie Don in clinic 07/18/23 and looks that appointment went well. She did just recently have a stress test performed 07/16/23 ordered by Ward Givens NP for evaluation of progressive dyspnea. Result was low risk but no completely normal, could not rule out small area of scar. Will route to [facility] for input on whether he is comfortable with our team clearing her to undergo bronchoscopy under general anesthesia with this result - appears to have been doing very well at OV 07/18/23 with Suzann. Chris - Please route response to [facility] PREOP (the pre-op pool). Thank you.  Will also route to pharm for input on holding Eliquis.

## 2023-07-29 NOTE — Telephone Encounter (Signed)
I have notified Robin (DPR).   Nothing further needed.

## 2023-07-29 NOTE — Telephone Encounter (Signed)
Patient Name: RAKELL RAPPLEYEA  DOB: Aug 20, 1946 MRN: [id number]  Primary Cardiologist: Julien Nordmann, MD  Chart reviewed as part of pre-operative protocol coverage. Given past medical history and recent low-risk ischemic testing, based on ACC/AHA guidelines, SHAYLINN ZURLO is at acceptable risk for the planned procedure without further cardiovascular testing.   Please call with questions.  Nicolasa Ducking, NP 07/29/2023, 5:07 PM

## 2023-07-29 NOTE — Telephone Encounter (Signed)
-----   Message from Verlee Monte sent at 07/28/2023 12:23 PM EDT ----- Regarding: Request for pre-operative cardiac clearance Request for pre-operative cardiac clearance:  1. What type of surgery is being performed?  ROBOTIC ASSISTED NAVIGATIONAL BRONCHOSCOPY; ENDOBRONCHIAL ULTRASOUND   2. When is this surgery scheduled?  08/05/2023  3. Type of clearance being requested (medical, pharmacy, both)? BOTH   4. Are there any medications that need to be held prior to surgery? APIXABAN  5. Practice name and name of physician performing surgery?  Performing surgeon: Dr. Sarina Ser, MD Requesting clearance: Quentin Mulling, FNP-C    6. Anesthesia type (none, local, MAC, general)? GENERAL  7. What is the office phone and fax number?   Fax: [phone]  ATTENTION: Unable to create telephone message as per your standard workflow. Directed by [facility] providers to send requests for cardiac clearance to this pool for appropriate distribution to provider covering pre-operative clearances.   Quentin Mulling, MSN, APRN, FNP-C, CEN [facility]  Peri-operative Services Nurse Practitioner Phone: [phone] 07/28/23 12:23 PM

## 2023-07-29 NOTE — Telephone Encounter (Signed)
Veronica Iba, MD  Bonney Leitz, CMA; Salena Saner, MD Acceptable risk for procedure Would recommend she hold Eliquis 2 days prior to procedure  restart when cleared by Dr. Merceda Elks

## 2023-07-29 NOTE — Telephone Encounter (Signed)
Patient with diagnosis of afib on Eliquis for anticoagulation.    Procedure: ROBOTIC ASSISTED NAVIGATIONAL BRONCHOSCOPY; ENDOBRONCHIAL ULTRASOUND  Date of procedure: 08/05/23   CHA2DS2-VASc Score = 4   This indicates a 4.8% annual risk of stroke. The patient's score is based upon: CHF History: 0 HTN History: 0 Diabetes History: 0 Stroke History: 0 Vascular Disease History: 1 Age Score: 2 Gender Score: 1      CrCl 40 ml/min Platelet count 151  Per office protocol, patient can hold Eliquis for 2 days prior to procedure.    **This guidance is not considered finalized until pre-operative APP has relayed final recommendations.**

## 2023-07-29 NOTE — Telephone Encounter (Signed)
1. What type of surgery is being performed?  ROBOTIC ASSISTED NAVIGATIONAL BRONCHOSCOPY; ENDOBRONCHIAL ULTRASOUND                       2. When is this surgery scheduled?  08/05/2023   3. Type of clearance being requested (medical, pharmacy, both)? BOTH   4. Are there any medications that need to be held prior to surgery? APIXABAN   5. Practice name and name of physician performing surgery?  Performing surgeon: Dr. Sarina Ser, MD Requesting clearance: Quentin Mulling, FNP-C     6. Anesthesia type (none, local, MAC, general)? GENERAL   7. What is the office phone and fax number?   Fax: [phone]

## 2023-07-30 ENCOUNTER — Encounter: Payer: Self-pay | Admitting: Pulmonary Disease

## 2023-07-30 ENCOUNTER — Encounter
Admission: RE | Admit: 2023-07-30 | Discharge: 2023-07-30 | Disposition: A | Discharge status: Home / Self Care | Payer: 59 | Source: Ambulatory Visit | Attending: Pulmonary Disease | Admitting: Pulmonary Disease

## 2023-07-30 ENCOUNTER — Other Ambulatory Visit: Payer: Self-pay | Admitting: *Deleted

## 2023-07-30 DIAGNOSIS — C20 Malignant neoplasm of rectum: Secondary | ICD-10-CM

## 2023-07-30 DIAGNOSIS — Z01812 Encounter for preprocedural laboratory examination: Secondary | ICD-10-CM

## 2023-07-30 MED FILL — Dexamethasone Sodium Phosphate Inj 100 MG/10ML: INTRAMUSCULAR | Qty: 1 | Status: AC

## 2023-07-30 MED FILL — Fluorouracil IV Soln 5 GM/100ML (50 MG/ML): INTRAVENOUS | Qty: 60 | Status: AC

## 2023-07-30 NOTE — Patient Instructions (Addendum)
Your procedure is scheduled on: 9/30 /2024 Monday  Report to the Registration Desk on the 1st floor of the [facility]. To find out your arrival time, please call [phone] between 1PM - 3PM on: Friday, Sept /27/2024  If your arrival time is 6:00 am, do not arrive before that time as the [facility] entrance doors do not open until 6:00 am.  REMEMBER: Instructions that are not followed completely may result in serious medical risk, up to and including death; or upon the discretion of your surgeon and anesthesiologist your surgery may need to be rescheduled.  Do not eat food after midnight the night before surgery.  No gum chewing or hard candies.  One week prior to surgery: Stop Anti-inflammatories (NSAIDS) such as Advil, Aleve, Ibuprofen, Motrin, Naproxen, Naprosyn and Aspirin based products such as Excedrin, Goody's Powder, BC Powder. Stop ANY OVER THE COUNTER supplements until after surgery. You may however, continue to take Tylenol if needed for pain up until the day of surgery.  Continue taking all prescribed medications with the exception of the following:  Eliquis-hold 2 days prior to surgery. Last dose  will be Friday, 08/02/2023 or follow the instructions you received from Physicians office.Please remember the last dose because the pre op RN will ask you this.   Follow recommendations from Cardiologist or PCP regarding stopping blood thinners.  TAKE ONLY THESE MEDICATIONS THE MORNING OF SURGERY WITH A SIP OF WATER:  gabapentin (NEURONTIN)  pantoprazole (PROTONIX) - if available -(take one the night before and one on the morning of surgery - helps to prevent nausea after surgery.) Potassium   No Alcohol for 24 hours before or after surgery.  No Smoking including e-cigarettes for 24 hours before surgery.  No chewable tobacco products for at least 6 hours before surgery.  No nicotine patches on the day of surgery.  Do not use any "recreational" drugs for at least a week  (preferably 2 weeks) before your surgery.  Please be advised that the combination of cocaine and anesthesia may have negative outcomes, up to and including death. If you test positive for cocaine, your surgery will be cancelled.  On the morning of surgery brush your teeth with toothpaste and water, you may rinse your mouth with mouthwash if you wish. Do not swallow any toothpaste or mouthwash.  Please shower  on day of surgery.  Do not wear jewelry, make-up, hairpins, clips or nail polish.  For welded (permanent) jewelry: bracelets, anklets, waist bands, etc.  Please have this removed prior to surgery.  If it is not removed, there is a chance that hospital personnel will need to cut it off on the day of surgery.  Do not wear lotions, powders, or perfumes.   Do not shave body hair from the neck down 48 hours before surgery.  Contact lenses, hearing aids and dentures may not be worn into surgery.  Do not bring valuables to the hospital. [facility] is not responsible for any missing/lost belongings or valuables.     Notify your doctor if there is any change in your medical condition (cold, fever, infection).  Wear comfortable clothing (specific to your surgery type) to the hospital.  After surgery, you can help prevent lung complications by doing breathing exercises.  Take deep breaths and cough every 1-2 hours. Your doctor may order a device called an Incentive Spirometer to help you take deep breaths.  If you are being discharged the day of surgery, you will not be allowed to drive  home. You will need a responsible individual to drive you home and stay with you for 24 hours after surgery.    Please call the Pre-admissions Testing Dept. at [phone] if you have any questions about these instructions.  Surgery Visitation Policy:  Patients having surgery or a procedure may have two visitors.  Children under the age of 21 must have an adult with them who is not the  patient.  Inpatient Visitation:    Visiting hours are 7 a.m. to 8 p.m. Up to four visitors are allowed at one time in a patient room. The visitors may rotate out with other people during the day.  One visitor age 69 or older may stay with the patient overnight and must be in the room by 8 p.m.

## 2023-07-30 NOTE — Telephone Encounter (Signed)
Name: Veronica Boyd  DOB: Aug 01, 1946  MRN: [id number]   Primary Cardiologist: Julien Nordmann, MD  Chart reviewed as part of pre-operative protocol coverage. Given past medical history and recent low-risk ischemic testing, based on ACC/AHA guidelines, MATALYNN CLAES is at acceptable risk for the planned procedure without further cardiovascular testing.    Per office protocol, patient can hold Eliquis for 2 days prior to procedure.    I will route this recommendation to the requesting party via Epic fax function and remove from pre-op pool. Please call with questions.  Roe Rutherford Amarilis Belflower, PA 07/30/2023, 8:49 AM

## 2023-07-31 ENCOUNTER — Inpatient Hospital Stay (HOSPITAL_BASED_OUTPATIENT_CLINIC_OR_DEPARTMENT_OTHER): Payer: 59 | Admitting: Oncology

## 2023-07-31 ENCOUNTER — Inpatient Hospital Stay: Payer: 59

## 2023-07-31 ENCOUNTER — Other Ambulatory Visit: Payer: Self-pay | Admitting: Oncology

## 2023-07-31 ENCOUNTER — Encounter: Payer: Self-pay | Admitting: Oncology

## 2023-07-31 ENCOUNTER — Encounter: Payer: Self-pay | Admitting: Pulmonary Disease

## 2023-07-31 DIAGNOSIS — C20 Malignant neoplasm of rectum: Secondary | ICD-10-CM | POA: Diagnosis not present

## 2023-07-31 DIAGNOSIS — Z5112 Encounter for antineoplastic immunotherapy: Secondary | ICD-10-CM | POA: Diagnosis not present

## 2023-07-31 LAB — CMP (CANCER CENTER ONLY)
ALT: 10 U/L (ref 0–44)
AST: 19 U/L (ref 15–41)
Albumin: 3.6 g/dL (ref 3.5–5.0)
Alkaline Phosphatase: 75 U/L (ref 38–126)
Anion gap: 10 (ref 5–15)
BUN: 11 mg/dL (ref 8–23)
CO2: 26 mmol/L (ref 22–32)
Calcium: 8.9 mg/dL (ref 8.9–10.3)
Chloride: 103 mmol/L (ref 98–111)
Creatinine: 0.61 mg/dL (ref 0.44–1.00)
GFR, Estimated: >60 mL/min (ref >60)
Glucose, Bld: 129 mg/dL — ABNORMAL HIGH (ref 70–99)
Potassium: 3.3 mmol/L — ABNORMAL LOW (ref 3.5–5.1)
Sodium: 139 mmol/L (ref 135–145)
Total Bilirubin: 0.4 mg/dL (ref 0.3–1.2)
Total Protein: 6.8 g/dL (ref 6.5–8.1)

## 2023-07-31 LAB — CBC WITH DIFFERENTIAL (CANCER CENTER ONLY)
Abs Immature Granulocytes: 0.06 10*3/uL (ref 0.00–0.07)
Basophils Absolute: 0 10*3/uL (ref 0.0–0.1)
Basophils Relative: 0 %
Eosinophils Absolute: 0.1 10*3/uL (ref 0.0–0.5)
Eosinophils Relative: 1 %
HCT: 39.8 % (ref 36.0–46.0)
Hemoglobin: 12.8 g/dL (ref 12.0–15.0)
Immature Granulocytes: 1 %
Lymphocytes Relative: 16 %
Lymphs Abs: 1.6 10*3/uL (ref 0.7–4.0)
MCH: 33.4 pg (ref 26.0–34.0)
MCHC: 32.2 g/dL (ref 30.0–36.0)
MCV: 103.9 fL — ABNORMAL HIGH (ref 80.0–100.0)
Monocytes Absolute: 1.4 10*3/uL — ABNORMAL HIGH (ref 0.1–1.0)
Monocytes Relative: 14 %
Neutro Abs: 6.8 10*3/uL (ref 1.7–7.7)
Neutrophils Relative %: 68 %
Platelet Count: 118 10*3/uL — ABNORMAL LOW (ref 150–400)
RBC: 3.83 MIL/uL — ABNORMAL LOW (ref 3.87–5.11)
RDW: 14.5 % (ref 11.5–15.5)
WBC Count: 9.9 10*3/uL (ref 4.0–10.5)
nRBC: 0 % (ref 0.0–0.2)

## 2023-07-31 MED ORDER — HYDROCOD POLI-CHLORPHE POLI ER 10-8 MG/5ML PO SUER
5.0000 mL | Freq: Every evening | ORAL | 0 refills | Status: DC | PRN
Start: 2023-07-31 — End: 2023-08-20

## 2023-07-31 MED ORDER — SODIUM CHLORIDE 0.9 % IV SOLN
5.0000 mg/kg | Freq: Once | INTRAVENOUS | Status: AC
  Administered 2023-07-31: 200 mg via INTRAVENOUS
  Filled 2023-07-31: qty 8

## 2023-07-31 MED ORDER — DEXTROSE 5 % IV SOLN
Freq: Once | INTRAVENOUS | Status: AC
  Filled 2023-07-31: qty 250

## 2023-07-31 MED ORDER — OXALIPLATIN CHEMO INJECTION 100 MG/20ML
85.0000 mg/m2 | Freq: Once | INTRAVENOUS | Status: AC
  Administered 2023-07-31: 100 mg via INTRAVENOUS
  Filled 2023-07-31: qty 20

## 2023-07-31 MED ORDER — LEUCOVORIN CALCIUM INJECTION 350 MG
400.0000 mg/m2 | Freq: Once | INTRAVENOUS | Status: AC
  Administered 2023-07-31: 496 mg via INTRAVENOUS
  Filled 2023-07-31: qty 24.8

## 2023-07-31 MED ORDER — SODIUM CHLORIDE 0.9 % IV SOLN
2400.0000 mg/m2 | INTRAVENOUS | Status: DC
  Administered 2023-07-31: 3000 mg via INTRAVENOUS
  Filled 2023-07-31: qty 60

## 2023-07-31 MED ORDER — DIPHENHYDRAMINE HCL 50 MG/ML IJ SOLN
25.0000 mg | Freq: Once | INTRAMUSCULAR | Status: AC
  Administered 2023-07-31: 25 mg via INTRAVENOUS
  Filled 2023-07-31: qty 1

## 2023-07-31 MED ORDER — SODIUM CHLORIDE 0.9 % IV SOLN
10.0000 mg | Freq: Once | INTRAVENOUS | Status: AC
  Administered 2023-07-31: 10 mg via INTRAVENOUS
  Filled 2023-07-31: qty 10

## 2023-07-31 MED ORDER — FAMOTIDINE IN NACL 20-0.9 MG/50ML-% IV SOLN
20.0000 mg | Freq: Once | INTRAVENOUS | Status: AC
  Administered 2023-07-31: 20 mg via INTRAVENOUS
  Filled 2023-07-31: qty 50

## 2023-07-31 MED ORDER — FLUOROURACIL CHEMO INJECTION 500 MG/10ML
400.0000 mg/m2 | Freq: Once | INTRAVENOUS | Status: AC
  Administered 2023-07-31: 500 mg via INTRAVENOUS
  Filled 2023-07-31: qty 10

## 2023-07-31 MED ORDER — SODIUM CHLORIDE 0.9 % IV SOLN
Freq: Once | INTRAVENOUS | Status: AC
  Filled 2023-07-31: qty 250

## 2023-07-31 MED ORDER — PALONOSETRON HCL INJECTION 0.25 MG/5ML
0.2500 mg | Freq: Once | INTRAVENOUS | Status: AC
  Administered 2023-07-31: 0.25 mg via INTRAVENOUS
  Filled 2023-07-31: qty 5

## 2023-07-31 NOTE — Progress Notes (Signed)
Perioperative / Anesthesia Services  Pre-Admission Testing Clinical Review / Pre-Operative Anesthesia Consult  Date: 08/02/23  Patient Demographics:  Name: Veronica Boyd DOB:   04/08/1946 MRN:   [id number]  Planned Surgical Procedure(s):    Case: [id number] Date/Time: 08/05/23 1215   Procedures:      ROBOTIC ASSISTED NAVIGATIONAL BRONCHOSCOPY (Left)     ENDOBRONCHIAL ULTRASOUND (Left)   Anesthesia type: General   Pre-op diagnosis: Left Lower Lobe Nodule   Location: [facility] PROCEDURE RM 02 / [facility] ORS FOR ANESTHESIA GROUP   Surgeons: Veronica Saner, MD     NOTE: Available PAT nursing documentation and vital signs have been reviewed. Clinical nursing staff has updated patient's PMH/PSHx, current medication list, and drug allergies/intolerances to ensure comprehensive history available to assist in medical decision making as it pertains to the aforementioned surgical procedure and anticipated anesthetic course. Extensive review of available clinical information personally performed. [facility] PMH and PSHx updated with any diagnoses/procedures that  may have been inadvertently omitted during her intake with the pre-admission testing department's nursing staff.  Clinical Discussion:  Veronica Boyd is a 77 y.o. female who is submitted for pre-surgical anesthesia review and clearance prior to her undergoing the above procedure. Patient has never been a smoker. Pertinent PMH includes: atrial fibrillation/flutter, coronary artery calcifications, aortic atherosclerosis, HLD, multiple lung nodules, pectus excavatum, bronchial obstruction, dyspnea, follicular lymphoma, rectal carcinoma, chemotherapy-induced anemia, thrombocytopenia, severe protein calorie malnutrition, GERD (on daily PPI), OA.  Patient is followed by cardiology Veronica Milling, MD). She was last seen in the cardiology clinic on 07/04/2023; notes reviewed. At the time of her clinic visit, patient was experiencing episodes of progressive  exertional dyspnea over the course of the last year.  Given her increase in symptoms, patient was referred for further cardiovascular testing and presented to review results.  Patient denied any chest pain, PND, orthopnea, palpitations, significant peripheral edema, vertiginous symptoms, or presyncope/syncope.  Patient with weakness and fatigue in the setting of known malignancy.  Patient is on appetite stimulation for severe protein calorie malnutrition; takes megestrol. Patient with a past medical history significant for cardiovascular diagnoses. Documented physical exam was grossly benign, providing no evidence of acute exacerbation and/or decompensation of the patient's known cardiovascular conditions.  TTE performed on 04/08/2023 revealed a normal left ventricular systolic function with an EF of >55%.  There were no regional wall motion abnormalities.  Diastolic Doppler parameters indeterminate.  Right ventricular size and function normal.  There were no significant valvular abnormalities.  All transvalvular gradients were noted to be normal providing no evidence suggestive of valvular stenosis.  Aorta normal in size with no evidence of aneurysmal dilatation.  Long-term cardiac event monitor study was performed on 06/10/2023 revealing a predominant underlying sinus rhythm at an average rate of 102 bpm; range 50-226 bpm.  Patient with a 16% atrial flutter burden with rates ranging from 78-226 bpm.  The longest episode lasted 31 minutes and 49 seconds at an average rate of 168 bpm.  Cardiology noted possibility of atrial tachycardia.  Single PACs were frequent (9%, 181,992), atrial couplets were rare (1.4%, 14,036), and atrial triplets were also rare (<1.0%, 6711).  Study revealed 1 episode of NSVT lasting for 4 beats at a rate of 226 bpm  Patient with an atrial fibrillation diagnosis; CHA2DS2-VASc Score = 4 (age x 2, sex, vascular disease history). Her rate and rhythm are currently being maintained on oral  metoprolol succinate. She is chronically anticoagulated using standard dose apixaban; reported to be compliant with  therapy with no evidence or reports of GI/GU bleeding.  Blood pressure reasonably controlled at 142/86 mmHg on currently prescribed beta-blocker (metoprolol succinate) monotherapy. She is on simvastatin for her HLD diagnosis and further ASCVD prevention.  Patient is not diabetic. Patient does not have an OSAH diagnosis.  Patient's functional capacity is limited by her age, oncology history, overall cardiopulmonary status, and other various multiple medical comorbidities. Due to her exertional dyspnea, patient experiences difficulties performing her ADLs/IADLs without varying degrees of cardiovascular limitation.  Per the DAS[facility], patient not felt to be able to achieve 4 METS of physical activity without experiencing, at least to some degree, significant angina/anginal equivalent symptoms.  Given her progressive dyspnea, the decision was made to further evaluate patient via nuclear stress test.  Patient to follow-up with outpatient cardiology in 1 month or sooner if needed.  Since patient was last seen by her cardiologist, she has undergone the noninvasive cardiovascular workup that was recommended to further evaluate her symptoms of progressive dyspnea.  Myocardial perfusion imaging study performed on 07/16/2023 revealed a normal left ventricular systolic function with a nuclear stress EF of 67%.  There was no evidence of stress-induced myocardial ischemia or arrhythmia.  That said, overall diagnostic quality was affected due to significant extracardiac activity/artifact.  No evidence of transient ischemic dilatation (TID).  Overall, study determined to be low risk.  Veronica Boyd has a history of known multiple pulmonary nodules.  CT imaging of the chest abdomen pelvis performed on 07/10/2023 revealed significant interval increase in size of a LEFT lower lobe pulmonary nodule now measuring 1.9  x 1.4 cm (previously 1.0 cm).  Patient is under the care of Dr. Sarina Ser, MD (pulmonary medicine).  She has subsequently been scheduled for a ROBOTIC ASSISTED NAVIGATIONAL BRONCHOSCOPY (Left); ENDOBRONCHIAL ULTRASOUND (Left) on 08/05/2023. Given patient's past medical history significant for cardiovascular diagnoses, presurgical cardiac clearance was sought by the PAT team.  Per cardiology, "given past medical history and recent low-risk ischemic testing, based on ACC/AHA guidelines, Veronica Boyd is at [facility] risk for the planned procedure without further cardiovascular testing".   Again, this patient is on daily oral anticoagulation therapy using a DOAC medication.  She has been instructed on recommendations for holding her apixaban for 2 days prior to her procedure with plans to restart as soon as postoperative bleeding risk felt to be minimized by her attending surgeon. The patient has been instructed that her last dose of her apixaban should be on 08/02/2023.  Patient denies previous perioperative complications with anesthesia in the past. In review of the available records, it is noted that patient underwent a general anesthetic course here at [facility] in 11/2022 without documented complications.      07/31/2023    9:01 AM 07/31/2023    8:56 AM 07/23/2023   10:52 AM  Vitals with BMI  Height   4\' 10"   Weight  87 lbs 87 lbs 13 oz  BMI  18.19 18.36  Systolic 111 90 120  Diastolic 77 40 70  Pulse  100 83    Providers/Specialists:   NOTE: Primary physician provider listed below. Patient may have been seen by APP or partner within same practice.   PROVIDER ROLE / SPECIALTY LAST Barnie Del, MD Pulmonary Medicine (Surgeon) 07/23/2023  Barbette Reichmann, MD Primary Care Provider 07/30/2023  Julien Nordmann, MD Cardiology 07/18/2023; update preop APP call on 07/30/2023  Gerarda Fraction, MD Medical Collagen 07/31/2023  Allergies:  Patient has no known allergies.  Current Home Medications:   No current facility-administered medications for this encounter.    apixaban (ELIQUIS) 5 MG TABS tablet   chlorpheniramine-HYDROcodone (TUSSIONEX) 10-8 MG/5ML   diclofenac sodium (VOLTAREN) 1 % GEL   diphenoxylate-atropine (LOMOTIL) 2.5-0.025 MG tablet   gabapentin (NEURONTIN) 300 MG capsule   lidocaine-prilocaine (EMLA) cream   megestrol (MEGACE) 40 MG tablet   metoprolol succinate (TOPROL XL) 25 MG 24 hr tablet   ondansetron (ZOFRAN) 8 MG tablet   pantoprazole (PROTONIX) 20 MG tablet   Potassium 99 MG TABS   predniSONE (DELTASONE) 10 MG tablet   simvastatin (ZOCOR) 20 MG tablet   tiZANidine (ZANAFLEX) 2 MG tablet   traMADol-acetaminophen (ULTRACET) 37.5-325 MG tablet    heparin lock flush 100 unit/mL   heparin lock flush 100 unit/mL   heparin lock flush 100 unit/mL   heparin lock flush 100 unit/mL   sodium chloride flush (NS) 0.9 % injection 10 mL   sodium chloride flush (NS) 0.9 % injection 10 mL   sodium chloride flush (NS) 0.9 % injection 10 mL   sodium chloride flush (NS) 0.9 % injection 10 mL   yttrium-90 injection 22.3 millicurie   History:   Past Medical History:  Diagnosis Date   Anemia due to antineoplastic chemotherapy    Aortic atherosclerosis (HCC)    Arthritis    Atrial fibrillation and flutter (HCC)    a.) 04/2023 Zio: Monitor 1 - 16 % aflutter burden w/ avg rate of 168, (78-226), possible Atach. Freq PACs, rare PVCs. Monitor 2 - 15% afib/flutter burden @ 115 [phone] Poss Atach. 4 beats NSVT. Freq PACs, rare PVCs; b.)  CHA2DS2-VASc = 4 (age x2, sex, vascular disease history); c.) cardiac rate/rhythm maintained on oral metoprolol succinate; chronically anticoagulated using apixaban   Bronchial obstruction    Chemotherapy-induced neuropathy (HCC)    Chicken pox    Colon polyp    Coronary artery calcification seen on CT scan    a. 11/2022 CT Chest: Coronary artery calcification and  aortic atherosclerosis.   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 04/2023 Echo: EF >55%, nl RV size/fxn.  No significant valvular disease observed.   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, cN2a, cM1a)   Thrombocytopenia (HCC)    Past Surgical History:  Procedure Laterality Date   AXILLARY LYMPH NODE DISSECTION Right 08/21/2016   Procedure: AXILLARY LYMPH NODE excision;  Surgeon: Nadeen Landau, MD;  Location: [facility] ORS;  Service: General;  Laterality: Right;   CATARACT EXTRACTION, BILATERAL Bilateral    COLONOSCOPY N/A 09/19/2020   Procedure: COLONOSCOPY;  Surgeon: Regis Bill, MD;  Location: [facility] ENDOSCOPY;  Service: Endoscopy;  Laterality: N/A;   PORTA CATH INSERTION N/A 09/16/2017   Procedure: PORTA CATH INSERTION;  Surgeon: Annice Needy, MD;  Location: [facility] INVASIVE CV LAB;  Service: Cardiovascular;  Laterality: N/A;   TONSILLECTOMY     TOTAL HIP ARTHROPLASTY Left 1992   Family History  Problem Relation Age of Onset   Heart failure Mother    Emphysema  Father    Diabetes Sister    Lung cancer Brother    Diabetes Brother    Breast cancer Paternal Aunt    Basal cell carcinoma Daughter    Social History   Tobacco Use   Smoking status: Never   Smokeless tobacco: Never  Vaping Use   Vaping status: Never Used  Substance Use Topics   Alcohol use: No   Drug use: No    Pertinent Clinical Results:  LABS:   Infusion on 07/31/2023  Component Date Value Ref Range Status   Sodium 07/31/2023 139  135 - 145 mmol/L Final   Potassium 07/31/2023 3.3 (L)  3.5 - 5.1 mmol/L Final   Chloride 07/31/2023 103  98 - 111  mmol/L Final   CO2 07/31/2023 26  22 - 32 mmol/L Final   Glucose, Bld 07/31/2023 129 (H)  70 - 99 mg/dL Final   Glucose reference range applies only to samples taken after fasting for at least 8 hours.   BUN 07/31/2023 11  8 - 23 mg/dL Final   Creatinine 16/08/9603 0.61  0.44 - 1.00 mg/dL Final   Calcium 54/07/8118 8.9  8.9 - 10.3 mg/dL Final   Total Protein 14/78/2956 6.8  6.5 - 8.1 g/dL Final   Albumin 21/30/8657 3.6  3.5 - 5.0 g/dL Final   AST 84/69/6295 19  15 - 41 U/L Final   ALT 07/31/2023 10  0 - 44 U/L Final   Alkaline Phosphatase 07/31/2023 75  38 - 126 U/L Final   Total Bilirubin 07/31/2023 0.4  0.3 - 1.2 mg/dL Final   GFR, Estimated 07/31/2023 >60  >60 mL/min Final   Comment: (NOTE) Calculated using the CKD-EPI Creatinine Equation (2021)    Anion gap 07/31/2023 10  5 - 15 Final   Performed at [facility], [address] [place], [place] [address]   WBC Count 07/31/2023 9.9  4.0 - 10.5 K/uL Final   RBC 07/31/2023 3.83 (L)  3.87 - 5.11 MIL/uL Final   Hemoglobin 07/31/2023 12.8  12.0 - 15.0 g/dL Final   HCT 24/40/1027 39.8  36.0 - 46.0 % Final   MCV 07/31/2023 103.9 (H)  80.0 - 100.0 fL Final   MCH 07/31/2023 33.4  26.0 - 34.0 pg Final   MCHC 07/31/2023 32.2  30.0 - 36.0 g/dL Final   RDW 25/36/6440 14.5  11.5 - 15.5 % Final   Platelet Count 07/31/2023 118 (L)  150 - 400 K/uL Final   SPECIMEN CHECKED FOR CLOTS   nRBC 07/31/2023 0.0  0.0 - 0.2 % Final   Neutrophils Relative % 07/31/2023 68  % Final   Neutro Abs 07/31/2023 6.8  1.7 - 7.7 K/uL Final   Lymphocytes Relative 07/31/2023 16  % Final   Lymphs Abs 07/31/2023 1.6  0.7 - 4.0 K/uL Final   Monocytes Relative 07/31/2023 14  % Final   Monocytes Absolute 07/31/2023 1.4 (H)  0.1 - 1.0 K/uL Final   Eosinophils Relative 07/31/2023 1  % Final   Eosinophils Absolute 07/31/2023 0.1  0.0 - 0.5 K/uL Final   Basophils Relative 07/31/2023 0  % Final   Basophils Absolute 07/31/2023 0.0  0.0 - 0.1 K/uL Final   Immature  Granulocytes 07/31/2023 1  % Final   Abs Immature Granulocytes 07/31/2023 0.06  0.00 - 0.07 K/uL Final   Performed at [facility], [address] [place], [place] [address]   CEA 07/31/2023 6.3 (H)  0.0 - 4.7 ng/mL Final   Comment: (NOTE)  Nonsmokers          <3.9                             Smokers             <5.6 [employer] Electrochemiluminescence Immunoassay (ECLIA) Values obtained with different assay methods or kits cannot be used interchangeably.  Results cannot be interpreted as absolute evidence of the presence or absence of malignant disease. Performed At: [facility] [address] [place], [place] [id number] Jolene Schimke MD WU:[id number]     ECG: Date: 07/04/2023 Time ECG obtained: 1502 PM Rate: 108 bpm Rhythm:  Sinus tachycardia with PACs Axis (leads I and aVF): Normal Intervals: PR 156 ms. QRS 62 ms. QTc 482 ms. ST segment and T wave changes: No evidence of acute ST segment elevation or depression.   Comparison: Similar to previous tracing obtained on 07/02/2023   IMAGING / PROCEDURES: MYOCARDIAL PERFUSION IMAGING STUDY (LEXISCAN) performed on 07/16/2023 There is a small in size, severe, fixed defect in the apical lateral segment most consistent with artifact but cannot rule out small area of scar. No significant ischemia is identified. Left ventricular systolic function is normal (LVEF greater than 65%). Coronary artery calcification and aortic atherosclerosis are noted.  Central venous catheter extends into the distal SVC. Pectus excavatum is evident. Low risk, probably normal pharmacologic myocardial perfusion stress test.  CT CHEST ABDOMEN PELVIS W CONTRAST performed on 07/10/2023 Diminished size of an infrahilar mass of the right lower lobe. Significant interval enlargement of a spiculated nodule of the dependent left lower lobe Diminished size or resolution of other nodules. Somewhat diminished  circumferential wall thickening of the rectum. Constellation of findings is consistent with mixed response to treatment. As previously reported, both primary lung malignancy and pulmonary metastatic disease remain differential considerations unless tissue diagnosis has been established No evidence of lymphadenopathy or metastatic disease in the abdomen or pelvis. 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 Coronary artery disease. Aortic atherosclerosis  LONG TERM CARDIAC EVENT MONITOR STUDY performed on 06/10/2023 Patch Wear Time:  18 days and 9 hours (2024-06-30T11:27:02-399 to 2024-07-28T19:31:35-0400) Monitor 1 Normal sinus rhythm Patient had a min HR of 50 bpm, max HR of 226 bpm, and avg HR of 102 bpm. Atrial Flutter occurred (16% burden), ranging from 78-226 bpm (avg of 126 bpm), the longest lasting 31 mins 49 secs with an avg rate of 168 bpm.  Possible atrial tachycardia Isolated SVEs were frequent (7.0%, 42231), SVE Couplets were rare (<1.0%, 1946), and SVE Triplets were rare (<1.0%, 824).  Isolated VEs were rare (<1.0%), VE Couplets were rare (<1.0%), and no VE Triplets were present.   Monitor 2 Normal sinus rhythm Patient had a min HR of 48 bpm, max HR of 235 bpm, and avg HR of 100 bpm.  1 run of Ventricular Tachycardia occurred lasting 4 beats with a max rate of 226 bpm (avg 202 bpm).  Atrial Fibrillation/Flutter occurred (15% burden), ranging from 66-235 bpm (avg of 115 bpm), the longest lasting 22 mins 56 secs with an avg rate of 102 bpm.   Possible atrial tachycardia. Isolated SVEs were frequent (9.0%, [id number]), SVE Couplets were occasional (1.4%, 14036), and SVE Triplets were rare (<1.0%, 6711).  Isolated VEs were rare (<1.0%), VE Couplets were rare (<1.0%), and no VE Triplets were present.   TRANSTHORACIC ECHOCARDIOGRAM performed on 04/08/2023 Left ventricular ejection fraction, by estimation, is >55%. The left  ventricle has normal  function. Left ventricular endocardial border not optimally defined to evaluate regional wall motion. Left ventricular diastolic function could not be evaluated.  Right ventricular systolic function is normal. The right ventricular size is normal.  The mitral valve was not well visualized. No evidence of mitral valve regurgitation. Tricuspid valve regurgitation not well assessed.  The aortic valve was not well visualized. Aortic valve regurgitation not well assessed.  Pulmonic valve regurgitation not well assessed.   NM PET IMAGE RESTAG (PS) SKULL BASE TO THIGH performed on 03/21/2023 Enlarging and hypermetabolic rectal mass consistent with local recurrence of rectal cancer. This may invade the posterior wall of the vagina. Significant enlargement of previously demonstrated right lower lobe lung mass which is hypermetabolic, consistent with malignancy. There are several other enlarging pulmonary nodules bilaterally with hypermetabolic activity, also consistent with metastatic disease. Consider tissue sampling if not previously performed given the patient's history of lymphoma and the rapid progression over the last 4 months. Primary bronchogenic carcinoma would be a consideration as well. No evidence of metastatic disease in the neck, abdomen or pelvis. Aortic atherosclerosis  Emphysema   Impression and Plan:  Veronica Boyd has been referred for pre-anesthesia review and clearance prior to her undergoing the planned anesthetic and procedural courses. Available labs, pertinent testing, and imaging results were personally reviewed by me in preparation for upcoming operative/procedural course. [facility] medical record has been updated following extensive record review and patient interview with PAT staff.   This patient has been appropriately cleared by cardiology with an overall ACCEPTABLE risk of experiencing significant perioperative cardiovascular complications. Based on clinical review  performed today (08/02/23), barring any significant acute changes in the patient's overall condition, it is anticipated that she will be able to proceed with the planned surgical intervention. Any acute changes in clinical condition may necessitate her procedure being postponed and/or cancelled. Patient will meet with anesthesia team (MD and/or CRNA) on the day of her procedure for preoperative evaluation/assessment. Questions regarding anesthetic course will be fielded at that time.   Pre-surgical instructions were reviewed with the patient during her PAT appointment, and questions were fielded to satisfaction by PAT clinical staff. She has been instructed on which medications that she will need to hold prior to surgery, as well as the ones that have been deemed safe/appropriate to take on the day of her procedure. As part of the general education provided by PAT, patient made aware both verbally and in writing, that she would need to abstain from the use of any illegal substances during her perioperative course.  She was advised that failure to follow the provided instructions could necessitate case cancellation or result in serious perioperative complications up to and including death. Patient encouraged to contact PAT and/or her surgeon's office to discuss any questions or concerns that may arise prior to surgery; verbalized understanding.   Quentin Mulling, MSN, APRN, FNP-C, CEN [facility]  Perioperative Services Nurse Practitioner Phone: [phone] Fax: [phone] 08/02/23 8:37 AM  NOTE: This note has been prepared using Dragon dictation software. Despite my best ability to proofread, there is always the potential that unintentional transcriptional errors may still occur from this process.

## 2023-07-31 NOTE — Progress Notes (Signed)
Nutrition Follow-up:  Patient with recurrent stage IV rectal cancer.  Also history of follicular lymphoma.  Patient on folfox and avastin.  Met with patient during infusion with daughter.  Patient reports that appetite is about the same.  Continues on megace.  Usually eats breakfast and lunch out at [employer] (2 eggs, hash brown, toast).  Supper is whatever daughter prepares.  Has not tried the ensure clear shakes.      Medications: reviewed  Labs: reviewed  Anthropometrics:   Weight 87 lb 12.8 oz on 9/25 86 lb on 7/17   NUTRITION DIAGNOSIS: Inadequate oral intake stable    INTERVENTION:  Continue appetite stimulant Continue high calorie, high protein foods    MONITORING, EVALUATION, GOAL: weight trends, intake   NEXT VISIT: as needed  Raquel Racey B. Freida Busman, RD, LDN Registered Dietitian [phone]

## 2023-07-31 NOTE — Progress Notes (Signed)
[facility]  [phone] Fax:([phone]) [phone]  ID: Kristopher Glee OB: [birth year]  MR#: [id number]  QMV#:[id number]  Patient Care Team: Barbette Reichmann, MD as PCP - General (Internal Medicine) Antonieta Iba, MD as PCP - Cardiology (Cardiology) Jeralyn Ruths, MD as Consulting Physician (Oncology) Benita Gutter, RN as Oncology Nurse Navigator Salena Saner, MD as Consulting Physician (Pulmonary Disease)  CHIEF COMPLAINT: Recurrent stage IV rectal cancer, history of follicular lymphoma.  INTERVAL HISTORY: Patient returns to clinic today for further evaluation and consideration of cycle 8 of FOLFOX plus Avastin.  She continues to tolerate her treatments well without significant side effects.  She currently feels well and is asymptomatic.  She does not complain of weakness or fatigue today. She has a fair appetite and is maintaining her weight. Her pain is well-controlled with tramadol.  She has a chronic peripheral neuropathy, but no other neurologic complaints.  She denies any recent fevers or illnesses.  She denies any chest pain, shortness of breath, or hemoptysis. She denies any nausea, vomiting, constipation or diarrhea.  She has no urinary complaints.  Patient offers no further specific complaints today.  REVIEW OF SYSTEMS:   Review of Systems  Constitutional: Negative.  Negative for fever, malaise/fatigue and weight loss.  Respiratory: Negative.  Negative for cough, hemoptysis and shortness of breath.   Cardiovascular: Negative.  Negative for chest pain and leg swelling.  Gastrointestinal:  Negative for abdominal pain, blood in stool, diarrhea, melena, nausea and vomiting.  Genitourinary: Negative.  Negative for dysuria.  Musculoskeletal: Negative.  Negative for back pain, joint pain and neck pain.  Skin: Negative.  Negative for rash.  Neurological:  Positive for tingling and sensory change. Negative for focal weakness, weakness and  headaches.  Psychiatric/Behavioral: Negative.  The patient is not nervous/anxious and does not have insomnia.     As per HPI. Otherwise, a complete review of systems is negative.   PAST MEDICAL HISTORY: Past Medical History:  Diagnosis Date   Anemia due to antineoplastic chemotherapy    Arthritis    Atrial flutter (HCC)    Bronchial obstruction    Chemotherapy-induced neuropathy (HCC)    Chicken pox    Colon polyp    Coronary artery calcification seen on CT scan    a. 11/2022 CT Chest: Coronary artery calcification and aortic atherosclerosis.   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 04/2023 Echo: EF >55%, nl RV size/fxn.  No significant valvular disease observed.   Hyperlipidemia    Mass of right chest wall    Multiple lung nodules 09/2022   On apixaban therapy    Osteoporosis    PAF (paroxysmal atrial fibrillation) (HCC)    a. 04/2023 Zio: Monitor 1 - 16 % aflutter burden w/ avg rate of 168, (78-226), possible Atach. Freq PACs, rare PVCs. Monitor 2 - 15% afib/flutter burden @ 115 [phone] Poss Atach. 4 beats NSVT. Freq PACs, rare PVCs; b. CHA2DS2VASc = 4-->eliquis.   Pectus excavatum    Protein-calorie malnutrition, severe (HCC)    Rectal carcinoma (HCC)    a.) recurrent stage IVA (cT4a, cN2a, cM1a)   Thrombocytopenia (HCC)     PAST SURGICAL HISTORY: Past Surgical History:  Procedure Laterality Date   AXILLARY LYMPH NODE DISSECTION Right 08/21/2016   Procedure: AXILLARY LYMPH NODE excision;  Surgeon:  Nadeen Landau, MD;  Location: [facility] ORS;  Service: General;  Laterality: Right;   CATARACT EXTRACTION, BILATERAL Bilateral    COLONOSCOPY N/A 09/19/2020   Procedure: COLONOSCOPY;  Surgeon: Regis Bill, MD;  Location: [facility] ENDOSCOPY;  Service: Endoscopy;  Laterality: N/A;   PORTA CATH INSERTION N/A 09/16/2017    Procedure: PORTA CATH INSERTION;  Surgeon: Annice Needy, MD;  Location: [facility] INVASIVE CV LAB;  Service: Cardiovascular;  Laterality: N/A;   TONSILLECTOMY     TOTAL HIP ARTHROPLASTY Left 1992    FAMILY HISTORY: Family History  Problem Relation Age of Onset   Heart failure Mother    Emphysema Father    Diabetes Sister    Lung cancer Brother    Diabetes Brother    Breast cancer Paternal Aunt    Basal cell carcinoma Daughter     ADVANCED DIRECTIVES (Y/N):  N  HEALTH MAINTENANCE: Social History   Tobacco Use   Smoking status: Never   Smokeless tobacco: Never  Vaping Use   Vaping status: Never Used  Substance Use Topics   Alcohol use: No   Drug use: No     Colonoscopy:  PAP:  Bone density:  Lipid panel:  No Known Allergies  Current Outpatient Medications  Medication Sig Dispense Refill   apixaban (ELIQUIS) 5 MG TABS tablet Take 1 tablet (5 mg total) by mouth 2 (two) times daily. 180 tablet 3   diclofenac sodium (VOLTAREN) 1 % GEL Apply 2 g topically 2 (two) times daily as needed.     diphenoxylate-atropine (LOMOTIL) 2.5-0.025 MG tablet TAKE 2 TABLETS BY MOUTH 4 (FOUR) TIMES DAILY AS NEEDED FOR DIARRHEA OR LOOSE STOOLS. 60 tablet 1   gabapentin (NEURONTIN) 300 MG capsule TAKE 1 CAPSULE BY MOUTH TWICE A DAY 60 capsule 2   lidocaine-prilocaine (EMLA) cream Apply 1 Application topically as needed. Apply to port 1 hour prior to use as needed 30 g 2   megestrol (MEGACE) 40 MG tablet Take 1 tablet (40 mg total) by mouth daily. 90 tablet 1   metoprolol succinate (TOPROL XL) 25 MG 24 hr tablet Take 0.5 tablets (12.5 mg total) by mouth at bedtime. 30 tablet 2   ondansetron (ZOFRAN) 8 MG tablet Take 1 tablet (8 mg total) by mouth every 8 (eight) hours as needed for nausea or vomiting. Start on the third day after chemotherapy. 60 tablet 2   pantoprazole (PROTONIX) 20 MG tablet Take 1 tablet by mouth daily as needed for heartburn.     Potassium 99 MG TABS Take 1 tablet by mouth as  needed.     simvastatin (ZOCOR) 20 MG tablet Take 20 mg by mouth at bedtime.     tiZANidine (ZANAFLEX) 2 MG tablet TAKE 1 TABLET BY MOUTH NIGHTLY AS NEEDED FOR MUSCLE SPASMS. 90 tablet 1   traMADol-acetaminophen (ULTRACET) 37.5-325 MG tablet Take 1 tablet by mouth every 8 (eight) hours as needed.     predniSONE (DELTASONE) 10 MG tablet Take 10 mg by mouth 2 (two) times daily. (Patient not taking: Reported on 07/31/2023)     No current facility-administered medications for this visit.   Facility-Administered Medications Ordered in Other Visits  Medication Dose Route Frequency Provider Last Rate Last Admin   bevacizumab-awwb (MVASI) 200 mg in sodium chloride 0.9 % 100 mL chemo infusion  5 mg/kg (Treatment Plan Recorded) Intravenous Once Jeralyn Ruths, MD       dexamethasone (DECADRON) 10 mg in sodium chloride 0.9 % 50 mL IVPB  10 mg  Intravenous Once Jeralyn Ruths, MD 204 mL/hr at 07/31/23 1123 10 mg at 07/31/23 1123   dextrose 5 % solution   Intravenous Once Jeralyn Ruths, MD       fluorouracil (ADRUCIL) 3,000 mg in sodium chloride 0.9 % 90 mL chemo infusion  2,400 mg/m2 (Treatment Plan Recorded) Intravenous 1 day or 1 dose Orlie Dakin, Tollie Pizza, MD       fluorouracil (ADRUCIL) chemo injection 500 mg  400 mg/m2 (Treatment Plan Recorded) Intravenous Once Jeralyn Ruths, MD       heparin lock flush 100 unit/mL  500 Units Intravenous Once Jeralyn Ruths, MD       heparin lock flush 100 unit/mL  500 Units Intracatheter PRN Jeralyn Ruths, MD       heparin lock flush 100 unit/mL  500 Units Intravenous Once Jeralyn Ruths, MD       heparin lock flush 100 unit/mL  500 Units Intravenous Once Jeralyn Ruths, MD       leucovorin 496 mg in dextrose 5 % 250 mL infusion  400 mg/m2 (Treatment Plan Recorded) Intravenous Once Jeralyn Ruths, MD       oxaliplatin (ELOXATIN) 100 mg in dextrose 5 % 500 mL chemo infusion  85 mg/m2 (Treatment Plan Recorded) Intravenous Once  Jeralyn Ruths, MD       sodium chloride flush (NS) 0.9 % injection 10 mL  10 mL Intravenous PRN Jeralyn Ruths, MD   10 mL at 12/04/17 0901   sodium chloride flush (NS) 0.9 % injection 10 mL  10 mL Intracatheter PRN Jeralyn Ruths, MD       sodium chloride flush (NS) 0.9 % injection 10 mL  10 mL Intravenous PRN Jeralyn Ruths, MD   10 mL at 04/24/21 0839   sodium chloride flush (NS) 0.9 % injection 10 mL  10 mL Intracatheter PRN Jeralyn Ruths, MD   10 mL at 04/12/23 1419   yttrium-90 injection 22.3 millicurie  22.3 millicurie Intravenous Once Carmina Miller, MD        OBJECTIVE: Vitals:   07/31/23 0856 07/31/23 0901  BP: (!) 90/40 111/77  Pulse: 100   Resp: 17   Temp: (!) 97 F (36.1 C)   SpO2: 97%          Body mass index is 18.18 kg/m.    ECOG FS:0 - Asymptomatic  General: Well-developed, well-nourished, no acute distress. Eyes: Pink conjunctiva, anicteric sclera. HEENT: Normocephalic, moist mucous membranes. Lungs: No audible wheezing or coughing. Heart: Regular rate and rhythm. Abdomen: Soft, nontender, no obvious distention. Musculoskeletal: No edema, cyanosis, or clubbing. Neuro: Alert, answering all questions appropriately. Cranial nerves grossly intact. Skin: No rashes or petechiae noted. Psych: Normal affect.  LAB RESULTS:  Lab Results  Component Value Date   NA 139 07/31/2023   K 3.3 (L) 07/31/2023   CL 103 07/31/2023   CO2 26 07/31/2023   GLUCOSE 129 (H) 07/31/2023   BUN 11 07/31/2023   CREATININE 0.61 07/31/2023   CALCIUM 8.9 07/31/2023   PROT 6.8 07/31/2023   ALBUMIN 3.6 07/31/2023   AST 19 07/31/2023   ALT 10 07/31/2023   ALKPHOS 75 07/31/2023   BILITOT 0.4 07/31/2023   GFRNONAA >60 07/31/2023   GFRAA >60 06/28/2020    Lab Results  Component Value Date   WBC 9.9 07/31/2023   NEUTROABS 6.8 07/31/2023   HGB 12.8 07/31/2023   HCT 39.8 07/31/2023   MCV 103.9 (H) 07/31/2023  PLT 118 (L) 07/31/2023      STUDIES: NM Myocar Multi W/Spect W/Wall Motion / EF  Result Date: 07/17/2023   Low risk, probably normal pharmacologic myocardial perfusion stress test.   There is a small in size, severe, fixed defect in the apical lateral segment most consistent with artifact but cannot rule out small area of scar.   No significant ischemia is identified.   Left ventricular systolic function is normal (LVEF greater than 65%).   Coronary artery calcification and aortic atherosclerosis are noted.  Central venous catheter extends into the distal SVC.   Pectus excavatum is evident.   CT CHEST ABDOMEN PELVIS W CONTRAST  Result Date: 07/13/2023 CLINICAL DATA:  Rectal cancer, assess treatment response * Tracking Code: BO * EXAM: CT CHEST, ABDOMEN, AND PELVIS WITH CONTRAST TECHNIQUE: Multidetector CT imaging of the chest, abdomen and pelvis was performed following the standard protocol during bolus administration of intravenous contrast. RADIATION DOSE REDUCTION: This exam was performed according to the departmental dose-optimization program which includes automated exposure control, adjustment of the mA and/or kV according to patient size and/or use of iterative reconstruction technique. CONTRAST:  75mL OMNIPAQUE IOHEXOL 300 MG/ML SOLN additional oral enteric contrast COMPARISON:  PET-CT, 03/21/2023 FINDINGS: CT CHEST FINDINGS Cardiovascular: Right chest port catheter. Aortic atherosclerosis. Normal heart size. Left coronary artery calcifications. No pericardial effusion. Mediastinum/Nodes: No enlarged mediastinal, hilar, or axillary lymph nodes. Thyroid gland, trachea, and esophagus demonstrate no significant findings. Lungs/Pleura: Diminished size of an infrahilar mass of the right lower lobe, measuring 4.5 x 3.5 cm, previously 6.0 x 4.6 cm when measured similarly (series 4, image 78). Significant interval enlargement of a spiculated nodule of the dependent left lower lobe, measuring 1.9 x 1.4 cm, previously 1.0 cm  (series 4, image 104). Diminished size or resolution of other nodules,, for example a nodule of the dependent right lower lobe measuring 0.6 cm, previously 1.1 cm (series 4, image 114). No pleural effusion or pneumothorax. Musculoskeletal: Pectus deformity.  No acute osseous findings. CT ABDOMEN PELVIS FINDINGS Hepatobiliary: No solid liver abnormality is seen. Contracted gallbladder. No gallstones, gallbladder wall thickening, or biliary dilatation. Pancreas: Unremarkable. No pancreatic ductal dilatation or surrounding inflammatory changes. Spleen: Normal in size without significant abnormality. Adrenals/Urinary Tract: Adrenal glands are unremarkable. Kidneys are normal, without renal calculi, solid lesion, or hydronephrosis. Bladder is unremarkable. Stomach/Bowel: Stomach is within normal limits. Appendix not clearly visualized somewhat diminished circumferential wall thickening of the rectum, assessment limited by dense metallic streak artifact from adjacent hip arthroplasty (series 2, image 104). Vascular/Lymphatic: Aortic atherosclerosis. No enlarged abdominal or pelvic lymph nodes. 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 No ascites. Musculoskeletal: No acute osseous findings. IMPRESSION: 1. Diminished size of an infrahilar mass of the right lower lobe. 2. Significant interval enlargement of a spiculated nodule of the dependent left lower lobe. 3. Diminished size or resolution of other nodules. 4. Somewhat diminished circumferential wall thickening of the rectum. 5. Constellation of findings is consistent with mixed response to treatment. As previously reported, both primary lung malignancy and pulmonary metastatic disease remain differential considerations unless tissue diagnosis has been established. 6. No evidence of lymphadenopathy or metastatic disease in the abdomen or pelvis. 7. 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 8. Coronary artery disease. Aortic Atherosclerosis (ICD10-I70.0). Electronically Signed   By: Jearld Lesch M.D.   On: 07/13/2023 21:18    ONCOLOGY  HISTORY:  Biopsy confirmed rectal cancer.  [facility] completed at [facility] on October 11, 2020 revealed extension of the tumor beyond the wall into the rectovaginal recess with suspected invasion of the vaginal wall posteriorly.  Tumor also appears to involve the internal anal sphincter.  There are also numerous prominent presacral and mesorectal lymph nodes highly suspicious for malignancy.  PET scan results from November 17, 2020 reviewed independently with 3 hypermetabolic right lower lobe lung nodules consistent with pulmonary metastasis.  Patient completed cycle 8 of FOLFOX on Mar 29, 2021.  She then completed cycle 5 of her 5-FU pump on May 26, 2021 and XRT on June 01, 2021.  PET scan results from July 17, 2021 reviewed independently with no evidence of disease other than enlarging right lower lobe lung lesion.  Patient completed SBRT to the right lung lesion on August 22, 2021.  Repeat PET scan on July 25, 2021 revealed minimal residual disease in the rectum and likely resolution of lesions in the lung.    ASSESSMENT: Recurrent stage IV rectal cancer, history of follicular lymphoma.  PLAN:    Recurrent stage IV rectal cancer: See oncology history as above.  PET scan results from Mar 21, 2023 reviewed independently with clear progression of disease in patient's pelvis as well as multiple pulmonary nodules.  Her CEA initially increased to 29.8, but now has trended down to 13.2.  Hospice and end-of-life care were previously discussed, but patient preferred to pursue treatment.  CT scan results July 13, 2023 reviewed independently and reported as above with overall improvement of disease, although patient has a left lower lobe lesion that has increased in size.   Proceed with cycle 8 of FOLFOX plus Avastin today.  Return to clinic in 2 days for pump removal, IV fluids and potassium, and Udenyca.  Patient will then return to clinic in 2 weeks for further evaluation and consideration of cycle 9.  Left lower lobe lung lesion: Increasing in size despite improvement of disease elsewhere.  Patient has bronchoscopy scheduled next week. Recurrent follicular lymphoma: CT scan results from December 23, 2019 reviewed independently with no obvious evidence of recurrent or progressive disease.  PET scan results as above consistent with rectal cancer metastasis and no evidence of lymphoma.   Patient received Zevalin on July 30, 2017 with not much therapeutic effect.  She subsequently underwent cycles 5 of R-CHOP chemotherapy with Neulasta support completing on December 12, 2017.  Patient continues to be in complete remission.  Neuropathy: Chronic and unchanged. Pelvic pain: Well-controlled with tramadol. Poor appetite/weight loss: Resolved.  Continue Megace as prescribed. Neutropenia: Resolved.  Continue Udenyca with pump removal. Thrombocytopenia: Mild, monitor. Hypokalemia: Mild.  Patient will receive 20 mEq IV potassium along with her fluids with her pump removal on Friday. Coping/adjustment: Patient was given a referral to [facility].  Patient expressed understanding and was in agreement with this plan. She also understands that She can call clinic at any time with any questions, concerns, or complaints.    Cancer Staging  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 [facility]) Staging form: Lymphoid Neoplasms, AJCC 6th Edition - Clinical stage from 08/27/2016: Stage IIE - Signed by Jeralyn Ruths, MD on 08/27/2016  Rectal cancer [facility]) Staging form: Colon and Rectum, AJCC 8th Edition - Clinical: Stage IVA [facility], [place]) - Signed by Jeralyn Ruths, MD on 11/02/2020   Jeralyn Ruths, MD   07/31/2023 11:24 AM

## 2023-07-31 NOTE — Progress Notes (Signed)
Patient here for oncology follow-up appointment, concerns of cough requesting Rx

## 2023-08-01 LAB — CEA: CEA: 6.3 ng/mL — ABNORMAL HIGH (ref 0.0–4.7)

## 2023-08-02 ENCOUNTER — Inpatient Hospital Stay: Payer: 59

## 2023-08-02 ENCOUNTER — Ambulatory Visit
Admission: RE | Admit: 2023-08-02 | Discharge: 2023-08-02 | Disposition: A | Discharge status: Home / Self Care | Payer: 59 | Source: Ambulatory Visit | Attending: Pulmonary Disease | Admitting: Pulmonary Disease

## 2023-08-02 ENCOUNTER — Ambulatory Visit: Payer: 59

## 2023-08-02 VITALS — BP 105/63 | HR 87

## 2023-08-02 DIAGNOSIS — Z01812 Encounter for preprocedural laboratory examination: Secondary | ICD-10-CM

## 2023-08-02 DIAGNOSIS — R911 Solitary pulmonary nodule: Secondary | ICD-10-CM | POA: Diagnosis present

## 2023-08-02 DIAGNOSIS — C20 Malignant neoplasm of rectum: Secondary | ICD-10-CM

## 2023-08-02 DIAGNOSIS — Z5112 Encounter for antineoplastic immunotherapy: Secondary | ICD-10-CM | POA: Diagnosis not present

## 2023-08-02 LAB — CBC WITH DIFFERENTIAL/PLATELET
Abs Immature Granulocytes: 0.11 10*3/uL — ABNORMAL HIGH (ref 0.00–0.07)
Basophils Absolute: 0 10*3/uL (ref 0.0–0.1)
Basophils Relative: 0 %
Eosinophils Absolute: 0 10*3/uL (ref 0.0–0.5)
Eosinophils Relative: 0 %
HCT: 37.2 % (ref 36.0–46.0)
Hemoglobin: 12.1 g/dL (ref 12.0–15.0)
Immature Granulocytes: 1 %
Lymphocytes Relative: 8 %
Lymphs Abs: 1 10*3/uL (ref 0.7–4.0)
MCH: 33.6 pg (ref 26.0–34.0)
MCHC: 32.5 g/dL (ref 30.0–36.0)
MCV: 103.3 fL — ABNORMAL HIGH (ref 80.0–100.0)
Monocytes Absolute: 0.5 10*3/uL (ref 0.1–1.0)
Monocytes Relative: 4 %
Neutro Abs: 10.8 10*3/uL — ABNORMAL HIGH (ref 1.7–7.7)
Neutrophils Relative %: 87 %
Platelets: 145 10*3/uL — ABNORMAL LOW (ref 150–400)
RBC: 3.6 MIL/uL — ABNORMAL LOW (ref 3.87–5.11)
RDW: 13.8 % (ref 11.5–15.5)
WBC: 12.4 10*3/uL — ABNORMAL HIGH (ref 4.0–10.5)
nRBC: 0 % (ref 0.0–0.2)

## 2023-08-02 LAB — BASIC METABOLIC PANEL - CANCER CENTER ONLY
Anion gap: 11 (ref 5–15)
BUN: 18 mg/dL (ref 8–23)
CO2: 23 mmol/L (ref 22–32)
Calcium: 9.1 mg/dL (ref 8.9–10.3)
Chloride: 103 mmol/L (ref 98–111)
Creatinine: 0.62 mg/dL (ref 0.44–1.00)
GFR, Estimated: >60 mL/min (ref >60)
Glucose, Bld: 159 mg/dL — ABNORMAL HIGH (ref 70–99)
Potassium: 4 mmol/L (ref 3.5–5.1)
Sodium: 137 mmol/L (ref 135–145)

## 2023-08-02 MED ORDER — PEGFILGRASTIM-CBQV 6 MG/0.6ML ~~LOC~~ SOSY
6.0000 mg | PREFILLED_SYRINGE | Freq: Once | SUBCUTANEOUS | Status: AC
  Administered 2023-08-02: 6 mg via SUBCUTANEOUS
  Filled 2023-08-02: qty 0.6

## 2023-08-02 MED ORDER — SODIUM CHLORIDE 0.9% FLUSH
10.0000 mL | INTRAVENOUS | Status: DC | PRN
  Administered 2023-08-02: 10 mL
  Filled 2023-08-02: qty 10

## 2023-08-02 MED ORDER — HEPARIN SOD (PORK) LOCK FLUSH 100 UNIT/ML IV SOLN
500.0000 [IU] | Freq: Once | INTRAVENOUS | Status: AC | PRN
  Administered 2023-08-02: 500 [IU]
  Filled 2023-08-02: qty 5

## 2023-08-02 MED ORDER — SODIUM CHLORIDE 0.9 % IV SOLN
Freq: Once | INTRAVENOUS | Status: AC
  Filled 2023-08-02: qty 250

## 2023-08-04 MED ORDER — LACTATED RINGERS IV SOLN: INTRAVENOUS | Status: DC

## 2023-08-04 MED ORDER — CHLORHEXIDINE GLUCONATE 0.12 % MT SOLN
15.0000 mL | Freq: Once | OROMUCOSAL | Status: AC
  Administered 2023-08-05: 15 mL via OROMUCOSAL

## 2023-08-04 MED ORDER — SODIUM CHLORIDE 0.9 % IV SOLN: Freq: Once | INTRAVENOUS | Status: AC

## 2023-08-04 MED ORDER — ORAL CARE MOUTH RINSE: 15.0000 mL | Freq: Once | OROMUCOSAL | Status: AC

## 2023-08-04 MED ORDER — IPRATROPIUM-ALBUTEROL 0.5-2.5 (3) MG/3ML IN SOLN
3.0000 mL | Freq: Once | RESPIRATORY_TRACT | Status: AC
  Administered 2023-08-05: 3 mL via RESPIRATORY_TRACT

## 2023-08-05 ENCOUNTER — Ambulatory Visit: Payer: 59 | Admitting: Urgent Care

## 2023-08-05 ENCOUNTER — Encounter
Admission: RE | Disposition: A | Discharge status: Home / Self Care | Payer: Self-pay | Source: Ambulatory Visit | Attending: Pulmonary Disease

## 2023-08-05 ENCOUNTER — Encounter: Payer: Self-pay | Admitting: Pulmonary Disease

## 2023-08-05 ENCOUNTER — Ambulatory Visit
Admission: RE | Admit: 2023-08-05 | Discharge: 2023-08-05 | Disposition: A | Discharge status: Home / Self Care | Payer: 59 | Source: Ambulatory Visit | Attending: Pulmonary Disease | Admitting: Pulmonary Disease

## 2023-08-05 ENCOUNTER — Other Ambulatory Visit: Payer: Self-pay

## 2023-08-05 ENCOUNTER — Ambulatory Visit: Payer: 59

## 2023-08-05 DIAGNOSIS — C8208 Follicular lymphoma grade I, lymph nodes of multiple sites: Secondary | ICD-10-CM | POA: Insufficient documentation

## 2023-08-05 DIAGNOSIS — Z681 Body mass index (BMI) 19 or less, adult: Secondary | ICD-10-CM | POA: Diagnosis not present

## 2023-08-05 DIAGNOSIS — I2510 Atherosclerotic heart disease of native coronary artery without angina pectoris: Secondary | ICD-10-CM | POA: Diagnosis not present

## 2023-08-05 DIAGNOSIS — C20 Malignant neoplasm of rectum: Secondary | ICD-10-CM | POA: Insufficient documentation

## 2023-08-05 DIAGNOSIS — R918 Other nonspecific abnormal finding of lung field: Secondary | ICD-10-CM | POA: Insufficient documentation

## 2023-08-05 DIAGNOSIS — R911 Solitary pulmonary nodule: Secondary | ICD-10-CM | POA: Diagnosis not present

## 2023-08-05 DIAGNOSIS — E43 Unspecified severe protein-calorie malnutrition: Secondary | ICD-10-CM | POA: Diagnosis not present

## 2023-08-05 DIAGNOSIS — C7800 Secondary malignant neoplasm of unspecified lung: Secondary | ICD-10-CM | POA: Diagnosis not present

## 2023-08-05 DIAGNOSIS — E785 Hyperlipidemia, unspecified: Secondary | ICD-10-CM | POA: Diagnosis not present

## 2023-08-05 DIAGNOSIS — Z923 Personal history of irradiation: Secondary | ICD-10-CM | POA: Insufficient documentation

## 2023-08-05 DIAGNOSIS — I480 Paroxysmal atrial fibrillation: Secondary | ICD-10-CM | POA: Insufficient documentation

## 2023-08-05 DIAGNOSIS — G629 Polyneuropathy, unspecified: Secondary | ICD-10-CM | POA: Insufficient documentation

## 2023-08-05 DIAGNOSIS — K219 Gastro-esophageal reflux disease without esophagitis: Secondary | ICD-10-CM | POA: Diagnosis not present

## 2023-08-05 DIAGNOSIS — Z9221 Personal history of antineoplastic chemotherapy: Secondary | ICD-10-CM | POA: Insufficient documentation

## 2023-08-05 DIAGNOSIS — Z7901 Long term (current) use of anticoagulants: Secondary | ICD-10-CM | POA: Insufficient documentation

## 2023-08-05 DIAGNOSIS — Z79899 Other long term (current) drug therapy: Secondary | ICD-10-CM | POA: Insufficient documentation

## 2023-08-05 HISTORY — DX: Pectus excavatum: Q67.6

## 2023-08-05 HISTORY — PX: ENDOBRONCHIAL ULTRASOUND: SHX5096

## 2023-08-05 HISTORY — DX: Cataract extraction status, right eye: Z98.41

## 2023-08-05 HISTORY — DX: Solitary pulmonary nodule: R91.1

## 2023-08-05 HISTORY — DX: Long term (current) use of anticoagulants: Z79.01

## 2023-08-05 HISTORY — DX: Atherosclerosis of aorta: I70.0

## 2023-08-05 HISTORY — DX: Unspecified atrial fibrillation: I48.91

## 2023-08-05 SURGERY — BRONCHOSCOPY, WITH BIOPSY USING ELECTROMAGNETIC NAVIGATION
Anesthesia: General | Laterality: Left

## 2023-08-05 MED ORDER — OXYCODONE HCL 5 MG PO TABS: 5.0000 mg | ORAL_TABLET | Freq: Once | ORAL | Status: DC | PRN

## 2023-08-05 MED ORDER — PROPOFOL 10 MG/ML IV BOLUS
INTRAVENOUS | Status: DC | PRN
Start: 2023-08-05 — End: 2023-08-05
  Administered 2023-08-05: 100 mg via INTRAVENOUS
  Administered 2023-08-05: 100 ug/kg/min via INTRAVENOUS

## 2023-08-05 MED ORDER — OXYCODONE HCL 5 MG/5ML PO SOLN: 5.0000 mg | Freq: Once | ORAL | Status: DC | PRN

## 2023-08-05 MED ORDER — ONDANSETRON HCL 4 MG/2ML IJ SOLN
INTRAMUSCULAR | Status: DC | PRN
  Administered 2023-08-05: 4 mg via INTRAVENOUS

## 2023-08-05 MED ORDER — ONDANSETRON HCL 4 MG/2ML IJ SOLN
INTRAMUSCULAR | Status: AC
  Filled 2023-08-05: qty 2

## 2023-08-05 MED ORDER — CHLORHEXIDINE GLUCONATE 0.12 % MT SOLN
OROMUCOSAL | Status: AC
  Filled 2023-08-05: qty 15

## 2023-08-05 MED ORDER — IPRATROPIUM-ALBUTEROL 0.5-2.5 (3) MG/3ML IN SOLN
RESPIRATORY_TRACT | Status: AC
  Filled 2023-08-05: qty 3

## 2023-08-05 MED ORDER — ROCURONIUM BROMIDE 10 MG/ML (PF) SYRINGE
PREFILLED_SYRINGE | INTRAVENOUS | Status: DC | PRN
  Administered 2023-08-05: 40 mg via INTRAVENOUS

## 2023-08-05 MED ORDER — FENTANYL CITRATE (PF) 100 MCG/2ML IJ SOLN
INTRAMUSCULAR | Status: AC
  Filled 2023-08-05: qty 2

## 2023-08-05 MED ORDER — GLYCOPYRROLATE 0.2 MG/ML IJ SOLN
INTRAMUSCULAR | Status: DC | PRN
  Administered 2023-08-05: .2 mg via INTRAVENOUS

## 2023-08-05 MED ORDER — SUGAMMADEX SODIUM 200 MG/2ML IV SOLN
INTRAVENOUS | Status: DC | PRN
Start: 2023-08-05 — End: 2023-08-05
  Administered 2023-08-05: 100 mg via INTRAVENOUS

## 2023-08-05 MED ORDER — PHENYLEPHRINE 80 MCG/ML (10ML) SYRINGE FOR IV PUSH (FOR BLOOD PRESSURE SUPPORT)
PREFILLED_SYRINGE | INTRAVENOUS | Status: AC
  Filled 2023-08-05: qty 10

## 2023-08-05 MED ORDER — ONDANSETRON HCL 4 MG/2ML IJ SOLN: 4.0000 mg | Freq: Once | INTRAMUSCULAR | Status: DC | PRN

## 2023-08-05 MED ORDER — PHENYLEPHRINE 80 MCG/ML (10ML) SYRINGE FOR IV PUSH (FOR BLOOD PRESSURE SUPPORT)
PREFILLED_SYRINGE | INTRAVENOUS | Status: DC | PRN
  Administered 2023-08-05 (×2): 160 ug via INTRAVENOUS
  Administered 2023-08-05 (×3): 80 ug via INTRAVENOUS
  Administered 2023-08-05: 160 ug via INTRAVENOUS

## 2023-08-05 MED ORDER — LIDOCAINE HCL (PF) 2 % IJ SOLN
INTRAMUSCULAR | Status: AC
  Filled 2023-08-05: qty 5

## 2023-08-05 MED ORDER — PROPOFOL 1000 MG/100ML IV EMUL
INTRAVENOUS | Status: AC
  Filled 2023-08-05: qty 100

## 2023-08-05 MED ORDER — DEXAMETHASONE SODIUM PHOSPHATE 10 MG/ML IJ SOLN
INTRAMUSCULAR | Status: AC
  Filled 2023-08-05: qty 1

## 2023-08-05 MED ORDER — DEXMEDETOMIDINE HCL IN NACL 80 MCG/20ML IV SOLN
INTRAVENOUS | Status: AC
  Filled 2023-08-05: qty 20

## 2023-08-05 MED ORDER — LIDOCAINE HCL (PF) 2 % IJ SOLN
INTRAMUSCULAR | Status: DC | PRN
Start: 2023-08-05 — End: 2023-08-05
  Administered 2023-08-05: 60 mg via INTRADERMAL

## 2023-08-05 MED ORDER — FENTANYL CITRATE (PF) 100 MCG/2ML IJ SOLN: 25.0000 ug | INTRAMUSCULAR | Status: DC | PRN

## 2023-08-05 MED ORDER — ROCURONIUM BROMIDE 10 MG/ML (PF) SYRINGE
PREFILLED_SYRINGE | INTRAVENOUS | Status: AC
  Filled 2023-08-05: qty 10

## 2023-08-05 MED ORDER — GLYCOPYRROLATE 0.2 MG/ML IJ SOLN
INTRAMUSCULAR | Status: AC
  Filled 2023-08-05: qty 1

## 2023-08-05 MED ORDER — DEXAMETHASONE SODIUM PHOSPHATE 10 MG/ML IJ SOLN
INTRAMUSCULAR | Status: DC | PRN
  Administered 2023-08-05: 10 mg via INTRAVENOUS

## 2023-08-05 MED ORDER — DEXMEDETOMIDINE HCL IN NACL 80 MCG/20ML IV SOLN
INTRAVENOUS | Status: DC | PRN
Start: 2023-08-05 — End: 2023-08-05
  Administered 2023-08-05: 8 ug via INTRAVENOUS

## 2023-08-05 MED ORDER — FENTANYL CITRATE (PF) 100 MCG/2ML IJ SOLN
INTRAMUSCULAR | Status: DC | PRN
  Administered 2023-08-05: 50 ug via INTRAVENOUS
  Administered 2023-08-05 (×2): 25 ug via INTRAVENOUS

## 2023-08-05 NOTE — Transfer of Care (Signed)
Immediate Anesthesia Transfer of Care Note  Patient: Veronica Boyd  Procedure(s) Performed: ROBOTIC ASSISTED NAVIGATIONAL BRONCHOSCOPY (Left) ENDOBRONCHIAL ULTRASOUND (Left)  Patient Location: PACU  Anesthesia Type:General  Level of Consciousness: drowsy and patient cooperative  Airway & Oxygen Therapy: Patient Spontanous Breathing and Patient connected to face mask oxygen  Post-op Assessment: Report given to RN and Post -op Vital signs reviewed and stable  Post vital signs: Reviewed and stable  Last Vitals:  Vitals Value Taken Time  BP 94/60 08/05/23 1415  Temp 36 C 08/05/23 1410  Pulse 100 08/05/23 1417  Resp 25 08/05/23 1417  SpO2 98 % 08/05/23 1417  Vitals shown include unfiled device data.  Last Pain:  Vitals:   08/05/23 1410  TempSrc:   PainSc: Asleep         Complications: No notable events documented.

## 2023-08-05 NOTE — Anesthesia Preprocedure Evaluation (Signed)
Anesthesia Evaluation  Patient identified by MRN, date of birth, ID band Patient awake    Reviewed: Allergy & Precautions, NPO status , Patient's Chart, lab work & pertinent test results  History of Anesthesia Complications Negative for: history of anesthetic complications  Airway Mallampati: II   Neck ROM: Full    Dental  (+) Missing   Pulmonary shortness of breath, neg sleep apnea, neg COPD, Patient abstained from smoking.Not current smoker Metastatic rectal cancer    + decreased breath sounds (in right lower fields)      Cardiovascular Exercise Tolerance: Poor METS(-) hypertension+ CAD  (-) Past MI + dysrhythmias Atrial Fibrillation  Rhythm:Irregular Rate:Normal  ECG 09/19/22:  Normal sinus rhythm Possible Left atrial enlargement   Neuro/Psych  Neuromuscular disease (neuropathy)  negative psych ROS   GI/Hepatic ,GERD  Medicated,,(+)     (-) substance abuse  Rectal CA   Endo/Other  negative endocrine ROSneg diabetes    Renal/GU negative Renal ROS     Musculoskeletal  (+) Arthritis ,    Abdominal   Peds  Hematology  (+) Blood dyscrasia, anemia Lymphoma 2017   Anesthesia Other Findings Severe protein calorie malnutrition  Reproductive/Obstetrics                             Anesthesia Physical Anesthesia Plan  ASA: 3  Anesthesia Plan: General   Post-op Pain Management: Ofirmev IV (intra-op)*   Induction: Intravenous  PONV Risk Score and Plan: 3 and Ondansetron, Dexamethasone and Treatment may vary due to age or medical condition  Airway Management Planned: Oral ETT  Additional Equipment: None  Intra-op Plan:   Post-operative Plan: Extubation in OR  Informed Consent: I have reviewed the patients History and Physical, chart, labs and discussed the procedure including the risks, benefits and alternatives for the proposed anesthesia with the patient or authorized  representative who has indicated his/her understanding and acceptance.     Dental advisory given  Plan Discussed with: CRNA  Anesthesia Plan Comments: (Patient consented for risks of anesthesia including but not limited to:  - adverse reactions to medications - damage to eyes, teeth, lips or other oral mucosa - nerve damage due to positioning  - sore throat or hoarseness - damage to heart, brain, nerves, lungs, other parts of body or loss of life  Informed patient about role of CRNA in peri- and intra-operative care.  Patient voiced understanding.)       Anesthesia Quick Evaluation

## 2023-08-05 NOTE — Op Note (Signed)
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  Has lung nodules responding to therapy except for the left lower lobe lung nodule.  Robotic bronchoscopy for sampling of the left lower lobe lung nodule is proposed.  Examination of the mediastinum with endobronchial ultrasound will be done as well.  Preoperative Diagnosis: Lung nodule on left lower lobe, enlarging, rule out tumor transformation Post Procedure Diagnosis: Same as above Consent: Verbal/Written: obtained  Benefits, limitations and potential complications of the procedure were discussed with the patient/family.  Complications from bronchoscopy are rare and most often minor, but if they occur they may include breathing difficulty, vocal cord spasm, hoarseness, slight fever, vomiting, dizziness, bronchospasm, infection, low blood oxygen, bleeding from biopsy site, or an allergic reaction to medications.  It is uncommon for patients to experience other more serious complications for example: Collapsed lung requiring chest tube placement, respiratory failure, heart attack and/or cardiac arrhythmia.  Patient understood the potential complications and agreed to proceed.  Surgeon: Gailen Shelter, MD Assistant/Scrub: Leonie Man, RRT Circulator: N/A Anesthesiologist/CRNA: Corinda Gubler, MD/Janice Zachery Dakins, CRNA Cytotechnology: Cytotechnologist present Fluoroscopy technician: Dahlia Client, RT Representatives: Garner Nash, Tim Lair ([employer])  Type of Anesthesia: General endotracheal  Procedures Performed:   Robotic bronchoscopy: Procedure consists of robotic navigation comprised of electromagnetics, optical pattern recognition and robotic kinematic data - to triangulate bronchoscope location during the procedure and provide  accurate positional data to biopsy a lesion. Cellvizio probe based confocal laser endomicroscopy (pCLE) utilizing blue laser endomicroscopy. Augmented fluoroscopy with Body Vision. Mediastinal inspection with endobronchial ultrasound  Description of Procedure:  Robotic bronchoscopy: The patient was brought to Procedure Room 2 (Bronchoscopy Suite) in the OR area where appropriate timeout was taken with the staff after the patient was inducted under general anesthesia.  The patient was inducted under general anesthesia and intubated by the anesthesia team.  Patient was intubated with a #8 ET tube without difficulty.  Tube was secured at 4 cm above the carina.  A Portex adapter was placed on the ET tube flange.  Once the patient was under adequate general anesthesia the Olympus therapeutic video bronchoscope was advanced and an anatomic airway tour and surveillance bronchoscopy was performed.The distal trachea appeared unremarkable. The main carina was sharp.  No secretions were seen in either right or left mainstem bronchi.  The right upper lobe had no lesions or mucosal abnormalities noted.  No secretions.  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  The LUL, lingula and LLL bronchi were patent.  There were no significant abnormalities noted on initial bronchoscopy these appeared to be free of endobronchial masses, lesions, or purulent secretions.  There were some scant benign appearing secretions on the left mainstem that were suctioned until cleared.  Once the survey bronchoscopy was completed, registration for the augmented fluoroscopy (Body Vision) was then performed with the fluoroscopic C arm.  Once this was completed, the robotic  bronchoscope ET tube adapter was placed and ETT was cut to proper length and secured on the mid plane.  The [facility] robotic scope was then advanced through the ETT and registration was performed successfully.  There was good correlation  between the robotic mapping and bronchoscopic mapping. With the assistance of fused navigation, the bronchoscope was advanced to the LLL nodule/mass. The tip of the working channel sat within 23 mm of the nodule  Positioning was confirmed with augmented fluoroscopy.  At this point Cellvizio probe based confocal laser endomicroscopy (pCLE) was utilized to confirm target acquisition.  The images through Cellvizio were consistent with inflammatory change.   Augmented fluoroscopy via Body Vision was utilized to optimize the position most favorable for biopsies, then the robotic bronchoscope was anchored to maintain position.  6 transbronchial biopsies were obtained at this point, the lesion could be seen moving during biopsies.  After confirmation of excellent hemostasis, 3 passes with a cytology brush on the LLL nodule/mass were performed, the brushes were cut into CytoLyt preservative.  A BAL was also obtained at this segment, which sent for cytology analysis.  For BAL sample acquisition purposes, Wendy ml of normal saline were instilled, and approximately 6 ml were recovered/trapped and sent for analysis.  ROSE was not performed however specimens were collected for evaluation by pathology.  The robotic bronchoscope was then retracted all the way out after confirmation of excellent hemostasis.  At this point the Olympus endobronchial ultrasound scope was introduced after the robotic bronchoscope was removed.  Examination of the mediastinum showed no suspicious adenopathy.  Once this was completed the endobronchial ultrasound scope was removed and the therapeutic bronchoscope was then reinserted.  Hemostasis was excellent.  The patient then received bronchial lavage with 9 mL of 1%  lidocaine via the ET tube. The patient tolerated the procedure well. No significant bleeding was observed at the conclusion of the procedure.  At this point, the patient was allowed to emerge from general anesthesia, and was extubated in the procedure room without incident.  The patient  was taken to the PACU in satisfactory condition.  Auscultation of the lungs showed no change from pre bronchoscopy examination.  Patient tolerated the procedure well with no untoward effects of anesthesia noted.   Specimens Obtained:  Transbronchial Forceps Biopsy: L LL, x 6  Transbronchial Brush: LLL 3  Targeted BAL: Aliquot 6 mL  Fluoroscopy: Augmented fluoroscopy (Body Vision) was utilized during the course of this procedure to assure that biopsies were taken in a safe manner under fluoroscopic guidance with spot films required.  Total fluoroscopy time: 5 minutes 50 seconds, Total dose 26.8 mGy.  Intraoperative images:  Right middle lobe bronchus surrounded by exudate/fibrosis consistent with post radiation changes:   Right lower lobe bronchi with airway distortion and radiation fibrosis:   Robotic scope deployed:   Complications:None, no pneumothorax on post film:   Estimated Blood Loss: Nil    Assessment and Plan/Additional Comments: Enlarging left lower lobe lung nodule rule out tumor transformation No evidence of mediastinal adenopathy Await pathology report     C. Danice Goltz, MD Advanced Bronchoscopy PCCM [facility]    *This note was dictated using voice recognition software/Dragon.  Despite best efforts to proofread, errors can occur which can change the meaning.  Any change was purely unintentional.

## 2023-08-05 NOTE — Discharge Instructions (Addendum)
AMBULATORY SURGERY  DISCHARGE INSTRUCTIONS   The drugs that you were given will stay in your system until tomorrow so for the next 24 hours you should not:  Drive an automobile Make any legal decisions Drink any alcoholic beverage  You may resume regular meals tomorrow.  Today it is better to start with liquids and gradually work up to solid foods.  You may eat anything you prefer, but it is better to start with liquids, then soup and crackers, and gradually work up to solid foods.  Please notify your doctor immediately if you have any unusual bleeding, trouble breathing, redness and pain at the surgery site, drainage, fever, or pain not relieved by medication.  Additional Instructions: Start Eliquis tomorrow  Please contact your physician with any problems or Same Day Surgery at [phone], Monday through Friday 6 am to 4 pm, or [place] at [facility] number at [phone].

## 2023-08-05 NOTE — Interval H&P Note (Signed)
Veronica Boyd has presented today for surgery, with the diagnosis of ENLARGING LEFT LOWER LOBE LUNG NODULE.  The various methods of treatment have been discussed with the patient and family. After consideration of risks, benefits and other options for treatment, the patient has consented to  Procedure(s): ROBOTIC ASSISTED NAVIGATIONAL BRONCHOSCOPY-LEFT, ENDOBRONCHIAL ULTRASOUND WITH POTENTIAL TBNA  as a surgical intervention.  The patient's history has been reviewed, patient examined, no change in status, stable for surgery.  I have reviewed the patient's chart and labs.  Questions were answered to the patient's satisfaction.  Benefits, limitations and potential complications of the procedure were discussed with the patient/family.  Complications from bronchoscopy are rare and most often minor, but if they occur they may include breathing difficulty, vocal cord spasm, hoarseness, slight fever, vomiting, dizziness, bronchospasm, infection, low blood oxygen, bleeding from biopsy site, or an allergic reaction to medications.  It is uncommon for patients to experience other more serious complications for example: Collapsed lung requiring chest tube placement, respiratory failure, heart attack and/or cardiac arrhythmia.  Patient agrees to proceed.  Gailen Shelter, MD Advanced Bronchoscopy PCCM [facility]    *This note was dictated using voice recognition software/Dragon.  Despite best efforts to proofread, errors can occur which can change the meaning. Any transcriptional errors that result from this process are unintentional and may not be fully corrected at the time of dictation.

## 2023-08-05 NOTE — Anesthesia Postprocedure Evaluation (Signed)
Anesthesia Post Note  Patient: Veronica Boyd  Procedure(s) Performed: ROBOTIC ASSISTED NAVIGATIONAL BRONCHOSCOPY (Left) ENDOBRONCHIAL ULTRASOUND (Left)  Patient location during evaluation: PACU Anesthesia Type: General Level of consciousness: awake and alert Pain management: pain level controlled Vital Signs Assessment: post-procedure vital signs reviewed and stable Respiratory status: spontaneous breathing, nonlabored ventilation, respiratory function stable and patient connected to nasal cannula oxygen Cardiovascular status: blood pressure returned to baseline and stable Postop Assessment: no apparent nausea or vomiting Anesthetic complications: no   No notable events documented.   Last Vitals:  Vitals:   08/05/23 1445 08/05/23 1456  BP:  108/67  Pulse: (!) 104 99  Resp:  18  Temp:  37.2 C  SpO2: 96% 93%    Last Pain:  Vitals:   08/05/23 1456  TempSrc: Temporal  PainSc: 0-No pain                 Stephanie Coup

## 2023-08-05 NOTE — Anesthesia Procedure Notes (Signed)
Procedure Name: Intubation Date/Time: 08/05/2023 12:45 PM  Performed by: Omer Jack, CRNAPre-anesthesia Checklist: Patient identified, Patient being monitored, Timeout performed, Emergency Drugs available and Suction available Patient Re-evaluated:Patient Re-evaluated prior to induction Oxygen Delivery Method: Circle system utilized Preoxygenation: Pre-oxygenation with 100% oxygen Induction Type: IV induction Ventilation: Mask ventilation without difficulty Laryngoscope Size: 3 and McGraph Grade View: Grade I Tube type: Oral Tube size: 8.0 mm Number of attempts: 1 Airway Equipment and Method: Stylet Placement Confirmation: ETT inserted through vocal cords under direct vision, positive ETCO2 and breath sounds checked- equal and bilateral Secured at: 20 cm Tube secured with: Tape Dental Injury: Teeth and Oropharynx as per pre-operative assessment

## 2023-08-06 LAB — SURGICAL PATHOLOGY

## 2023-08-07 LAB — CYTOLOGY - NON PAP

## 2023-08-13 MED FILL — Dexamethasone Sodium Phosphate Inj 100 MG/10ML: INTRAMUSCULAR | Qty: 1 | Status: AC

## 2023-08-14 ENCOUNTER — Encounter: Payer: Self-pay | Admitting: Cardiology

## 2023-08-14 ENCOUNTER — Inpatient Hospital Stay: Payer: 59

## 2023-08-14 ENCOUNTER — Encounter: Payer: Self-pay | Admitting: Oncology

## 2023-08-14 ENCOUNTER — Inpatient Hospital Stay (HOSPITAL_BASED_OUTPATIENT_CLINIC_OR_DEPARTMENT_OTHER): Payer: 59 | Admitting: Oncology

## 2023-08-14 ENCOUNTER — Other Ambulatory Visit: Payer: Self-pay

## 2023-08-14 ENCOUNTER — Ambulatory Visit: Payer: 59 | Attending: Cardiology | Admitting: Cardiology

## 2023-08-14 ENCOUNTER — Inpatient Hospital Stay: Payer: 59 | Attending: Oncology

## 2023-08-14 VITALS — BP 92/60 | HR 85

## 2023-08-14 VITALS — BP 99/68 | HR 135 | Temp 98.2°F | Resp 16 | Ht <= 58 in | Wt 83.7 lb

## 2023-08-14 DIAGNOSIS — Z5112 Encounter for antineoplastic immunotherapy: Secondary | ICD-10-CM | POA: Insufficient documentation

## 2023-08-14 DIAGNOSIS — Z5189 Encounter for other specified aftercare: Secondary | ICD-10-CM | POA: Insufficient documentation

## 2023-08-14 DIAGNOSIS — I480 Paroxysmal atrial fibrillation: Secondary | ICD-10-CM | POA: Diagnosis not present

## 2023-08-14 DIAGNOSIS — C20 Malignant neoplasm of rectum: Secondary | ICD-10-CM

## 2023-08-14 DIAGNOSIS — Z5111 Encounter for antineoplastic chemotherapy: Secondary | ICD-10-CM | POA: Diagnosis present

## 2023-08-14 DIAGNOSIS — Z95828 Presence of other vascular implants and grafts: Secondary | ICD-10-CM

## 2023-08-14 DIAGNOSIS — I4891 Unspecified atrial fibrillation: Secondary | ICD-10-CM | POA: Insufficient documentation

## 2023-08-14 DIAGNOSIS — C7801 Secondary malignant neoplasm of right lung: Secondary | ICD-10-CM | POA: Diagnosis not present

## 2023-08-14 DIAGNOSIS — R000 Tachycardia, unspecified: Secondary | ICD-10-CM | POA: Diagnosis not present

## 2023-08-14 DIAGNOSIS — Z79899 Other long term (current) drug therapy: Secondary | ICD-10-CM | POA: Insufficient documentation

## 2023-08-14 DIAGNOSIS — Z01812 Encounter for preprocedural laboratory examination: Secondary | ICD-10-CM

## 2023-08-14 LAB — CMP (CANCER CENTER ONLY)
ALT: 11 U/L (ref 0–44)
AST: 18 U/L (ref 15–41)
Albumin: 3.4 g/dL — ABNORMAL LOW (ref 3.5–5.0)
Alkaline Phosphatase: 77 U/L (ref 38–126)
Anion gap: 9 (ref 5–15)
BUN: 10 mg/dL (ref 8–23)
CO2: 27 mmol/L (ref 22–32)
Calcium: 8.7 mg/dL — ABNORMAL LOW (ref 8.9–10.3)
Chloride: 101 mmol/L (ref 98–111)
Creatinine: 0.71 mg/dL (ref 0.44–1.00)
GFR, Estimated: >60 mL/min (ref >60)
Glucose, Bld: 116 mg/dL — ABNORMAL HIGH (ref 70–99)
Potassium: 3.2 mmol/L — ABNORMAL LOW (ref 3.5–5.1)
Sodium: 137 mmol/L (ref 135–145)
Total Bilirubin: 0.4 mg/dL (ref 0.3–1.2)
Total Protein: 6.9 g/dL (ref 6.5–8.1)

## 2023-08-14 LAB — CBC WITH DIFFERENTIAL (CANCER CENTER ONLY)
Abs Immature Granulocytes: 0.08 10*3/uL — ABNORMAL HIGH (ref 0.00–0.07)
Basophils Absolute: 0.1 10*3/uL (ref 0.0–0.1)
Basophils Relative: 1 %
Eosinophils Absolute: 0 10*3/uL (ref 0.0–0.5)
Eosinophils Relative: 0 %
HCT: 41.6 % (ref 36.0–46.0)
Hemoglobin: 13.9 g/dL (ref 12.0–15.0)
Immature Granulocytes: 1 %
Lymphocytes Relative: 25 %
Lymphs Abs: 2.4 10*3/uL (ref 0.7–4.0)
MCH: 33.5 pg (ref 26.0–34.0)
MCHC: 33.4 g/dL (ref 30.0–36.0)
MCV: 100.2 fL — ABNORMAL HIGH (ref 80.0–100.0)
Monocytes Absolute: 1.4 10*3/uL — ABNORMAL HIGH (ref 0.1–1.0)
Monocytes Relative: 14 %
Neutro Abs: 5.9 10*3/uL (ref 1.7–7.7)
Neutrophils Relative %: 59 %
Platelet Count: 109 10*3/uL — ABNORMAL LOW (ref 150–400)
RBC: 4.15 MIL/uL (ref 3.87–5.11)
RDW: 13.5 % (ref 11.5–15.5)
WBC Count: 9.9 10*3/uL (ref 4.0–10.5)
nRBC: 0 % (ref 0.0–0.2)

## 2023-08-14 MED ORDER — HEPARIN SOD (PORK) LOCK FLUSH 100 UNIT/ML IV SOLN
500.0000 [IU] | Freq: Once | INTRAVENOUS | Status: AC
  Administered 2023-08-14: 500 [IU] via INTRAVENOUS
  Filled 2023-08-14: qty 5

## 2023-08-14 MED ORDER — AMIODARONE HCL 200 MG PO TABS: ORAL_TABLET | ORAL | 3 refills | Status: DC

## 2023-08-14 MED ORDER — POTASSIUM CHLORIDE CRYS ER 20 MEQ PO TBCR: 20.0000 meq | EXTENDED_RELEASE_TABLET | Freq: Two times a day (BID) | ORAL | 0 refills | Status: DC

## 2023-08-14 NOTE — Progress Notes (Signed)
[facility]  [phone] [phone]  ID: Veronica Boyd OB: January 11, 1946  MR#: [id number]  NGE#:[id number]  Patient Care Team: Barbette Reichmann, MD as PCP - General (Internal Medicine) Antonieta Iba, MD as PCP - Cardiology (Cardiology) Jeralyn Ruths, MD as Consulting Physician (Oncology) Benita Gutter, RN as Oncology Nurse Navigator Salena Saner, MD as Consulting Physician (Pulmonary Disease)  CHIEF COMPLAINT: Recurrent stage IV rectal cancer, history of follicular lymphoma.  INTERVAL HISTORY: Patient returns to clinic today for further evaluation and consideration of cycle 9 of FOLFOX plus Avastin.  Since waking up this morning she has had increased fatigue and shortness of breath.  She denies any chest pain.  EKG in clinic revealed patient was in atrial fibrillation.  She otherwise feels well.  She is tolerating her treatments without significant side effects.  She has a fair appetite and is maintaining her weight. Her pain is well-controlled with tramadol.  She has a chronic peripheral neuropathy, but no other neurologic complaints.  She denies any recent fevers or illnesses. She denies any nausea, vomiting, constipation or diarrhea.  She has no urinary complaints.  Patient offers no further specific complaints today.  REVIEW OF SYSTEMS:   Review of Systems  Constitutional:  Positive for malaise/fatigue. Negative for fever and weight loss.  Respiratory:  Positive for shortness of breath. Negative for cough and hemoptysis.   Cardiovascular:  Positive for palpitations. Negative for chest pain and leg swelling.  Gastrointestinal:  Negative for abdominal pain, blood in stool, diarrhea, melena, nausea and vomiting.  Genitourinary: Negative.  Negative for dysuria.  Musculoskeletal: Negative.  Negative for back pain, joint pain and neck pain.  Skin: Negative.  Negative for rash.  Neurological:  Positive for tingling and sensory change.  Negative for focal weakness, weakness and headaches.  Psychiatric/Behavioral: Negative.  The patient is not nervous/anxious and does not have insomnia.     As per HPI. Otherwise, a complete review of systems is negative.   PAST MEDICAL HISTORY: Past Medical History:  Diagnosis Date   Anemia due to antineoplastic chemotherapy    Aortic atherosclerosis (HCC)    Arthritis    Atrial fibrillation and flutter (HCC)    a.) 04/2023 Zio: Monitor 1 - 16 % aflutter burden w/ avg rate of 168, (78-226), possible Atach. Freq PACs, rare PVCs. Monitor 2 - 15% afib/flutter burden @ 115 [phone] Poss Atach. 4 beats NSVT. Freq PACs, rare PVCs; b.)  CHA2DS2-VASc = 4 (age x2, sex, vascular disease history); c.) cardiac rate/rhythm maintained on oral metoprolol succinate; chronically anticoagulated using apixaban   Bronchial obstruction    Chemotherapy-induced neuropathy (HCC)    Chicken pox    Colon polyp    Coronary artery calcification seen on CT scan    a. 11/2022 CT Chest: Coronary artery calcification and aortic atherosclerosis.   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 04/2023 Echo: EF >55%, nl RV size/fxn.  No significant valvular disease observed.   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, cN2a, cM1a)   Thrombocytopenia (HCC)  PAST SURGICAL HISTORY: Past Surgical History:  Procedure Laterality Date   AXILLARY LYMPH NODE DISSECTION Right 08/21/2016   Procedure: AXILLARY LYMPH NODE excision;  Surgeon: Nadeen Landau, MD;  Location: [facility] ORS;  Service: General;  Laterality: Right;   CATARACT EXTRACTION, BILATERAL Bilateral    COLONOSCOPY  N/A 09/19/2020   Procedure: COLONOSCOPY;  Surgeon: Regis Bill, MD;  Location: [facility] ENDOSCOPY;  Service: Endoscopy;  Laterality: N/A;   ENDOBRONCHIAL ULTRASOUND Left 08/05/2023   Procedure: ENDOBRONCHIAL ULTRASOUND;  Surgeon: Salena Saner, MD;  Location: [facility] ORS;  Service: Pulmonary;  Laterality: Left;   PORTA CATH INSERTION N/A 09/16/2017   Procedure: PORTA CATH INSERTION;  Surgeon: Annice Needy, MD;  Location: [facility] INVASIVE CV LAB;  Service: Cardiovascular;  Laterality: N/A;   TONSILLECTOMY     TOTAL HIP ARTHROPLASTY Left 1992    FAMILY HISTORY: Family History  Problem Relation Age of Onset   Heart failure Mother    Emphysema Father    Diabetes Sister    Lung cancer Brother    Diabetes Brother    Breast cancer Paternal Aunt    Basal cell carcinoma Daughter     ADVANCED DIRECTIVES (Y/N):  N  HEALTH MAINTENANCE: Social History   Tobacco Use   Smoking status: Never   Smokeless tobacco: Never  Vaping Use   Vaping status: Never Used  Substance Use Topics   Alcohol use: No   Drug use: No     Colonoscopy:  PAP:  Bone density:  Lipid panel:  No Known Allergies  Current Outpatient Medications  Medication Sig Dispense Refill   apixaban (ELIQUIS) 5 MG TABS tablet Take 1 tablet (5 mg total) by mouth 2 (two) times daily. 180 tablet 3   chlorpheniramine-HYDROcodone (TUSSIONEX) 10-8 MG/5ML Take 5 mLs by mouth at bedtime as needed for cough. (Patient not taking: Reported on 08/14/2023) 115 mL 0   diclofenac sodium (VOLTAREN) 1 % GEL Apply 2 g topically 2 (two) times daily as needed.     diphenoxylate-atropine (LOMOTIL) 2.5-0.025 MG tablet TAKE 2 TABLETS BY MOUTH 4 (FOUR) TIMES DAILY AS NEEDED FOR DIARRHEA OR LOOSE STOOLS. 60 tablet 1   gabapentin (NEURONTIN) 300 MG capsule TAKE 1 CAPSULE BY MOUTH TWICE A DAY 60 capsule 2   lidocaine-prilocaine (EMLA) cream Apply 1 Application topically as needed. Apply to port 1 hour prior to use as needed 30 g 2   megestrol  (MEGACE) 40 MG tablet Take 1 tablet (40 mg total) by mouth daily. 90 tablet 1   metoprolol succinate (TOPROL XL) 25 MG 24 hr tablet Take 0.5 tablets (12.5 mg total) by mouth at bedtime. 30 tablet 2   ondansetron (ZOFRAN) 8 MG tablet Take 1 tablet (8 mg total) by mouth every 8 (eight) hours as needed for nausea or vomiting. Start on the third day after chemotherapy. 60 tablet 2   pantoprazole (PROTONIX) 20 MG tablet Take 1 tablet by mouth daily as needed for heartburn.     Potassium 99 MG TABS Take 1 tablet by mouth as needed.     potassium chloride SA (KLOR-CON M) 20 MEQ tablet Take 1 tablet (20 mEq total) by mouth 2 (two) times daily. (Patient not taking: Reported on 08/14/2023) 30 tablet 0   simvastatin (ZOCOR) 20 MG tablet Take 20 mg by mouth at bedtime.     tiZANidine (ZANAFLEX) 2 MG tablet TAKE 1 TABLET BY MOUTH NIGHTLY AS NEEDED FOR MUSCLE SPASMS. 90 tablet 1   traMADol-acetaminophen (ULTRACET) 37.5-325 MG tablet Take  1 tablet by mouth every 8 (eight) hours as needed.     amiodarone (PACERONE) 200 MG tablet Take 1 tablet (200 mg) by mouth twice daily for 2 weeks, then decrease to 1 tablet (200 mg) by mouth daily. 90 tablet 3   predniSONE (DELTASONE) 10 MG tablet Take 10 mg by mouth 2 (two) times daily. (Patient not taking: Reported on 08/14/2023)     No current facility-administered medications for this visit.   Facility-Administered Medications Ordered in Other Visits  Medication Dose Route Frequency Provider Last Rate Last Admin   heparin lock flush 100 unit/mL  500 Units Intravenous Once Orlie Dakin, Tollie Pizza, MD       heparin lock flush 100 unit/mL  500 Units Intracatheter PRN Orlie Dakin, Tollie Pizza, MD       heparin lock flush 100 unit/mL  500 Units Intravenous Once Jeralyn Ruths, MD       heparin lock flush 100 unit/mL  500 Units Intravenous Once Jeralyn Ruths, MD       sodium chloride flush (NS) 0.9 % injection 10 mL  10 mL Intravenous PRN Jeralyn Ruths, MD   10 mL at  12/04/17 0901   sodium chloride flush (NS) 0.9 % injection 10 mL  10 mL Intracatheter PRN Jeralyn Ruths, MD       sodium chloride flush (NS) 0.9 % injection 10 mL  10 mL Intravenous PRN Jeralyn Ruths, MD   10 mL at 04/24/21 0839   sodium chloride flush (NS) 0.9 % injection 10 mL  10 mL Intracatheter PRN Jeralyn Ruths, MD   10 mL at 04/12/23 1419   yttrium-90 injection 22.3 millicurie  22.3 millicurie Intravenous Once Chrystal, Sherrine Maples, MD        OBJECTIVE: Vitals:   08/14/23 0858  BP: 99/68  Pulse: (!) 135  Resp: 16  Temp: 98.2 F (36.8 C)  SpO2: 96%       Body mass index is 17.49 kg/m.    ECOG FS:0 - Asymptomatic  General: Well-developed, well-nourished, no acute distress. Eyes: Pink conjunctiva, anicteric sclera. HEENT: Normocephalic, moist mucous membranes. Lungs: No audible wheezing or coughing. Heart: Regular rate and rhythm. Abdomen: Soft, nontender, no obvious distention. Musculoskeletal: No edema, cyanosis, or clubbing. Neuro: Alert, answering all questions appropriately. Cranial nerves grossly intact. Skin: No rashes or petechiae noted. Psych: Normal affect.  LAB RESULTS:  Lab Results  Component Value Date   NA 137 08/14/2023   K 3.2 (L) 08/14/2023   CL 101 08/14/2023   CO2 27 08/14/2023   GLUCOSE 116 (H) 08/14/2023   BUN 10 08/14/2023   CREATININE 0.71 08/14/2023   CALCIUM 8.7 (L) 08/14/2023   PROT 6.9 08/14/2023   ALBUMIN 3.4 (L) 08/14/2023   AST 18 08/14/2023   ALT 11 08/14/2023   ALKPHOS 77 08/14/2023   BILITOT 0.4 08/14/2023   GFRNONAA >60 08/14/2023   GFRAA >60 06/28/2020    Lab Results  Component Value Date   WBC 9.9 08/14/2023   NEUTROABS 5.9 08/14/2023   HGB 13.9 08/14/2023   HCT 41.6 08/14/2023   MCV 100.2 (H) 08/14/2023   PLT 109 (L) 08/14/2023     STUDIES: CT SUPER D CHEST WO MONARCH PILOT  Result Date: 08/05/2023 CLINICAL DATA:  Hypermetabolic lung nodules in this patient with history of rectal cancer. Enlarging  left lower lobe lung nodule. * Tracking Code: BO * EXAM: CT CHEST WITHOUT CONTRAST TECHNIQUE: Multidetector CT imaging of the chest was performed using thin slice collimation  for electromagnetic bronchoscopy planning purposes, without intravenous contrast. RADIATION DOSE REDUCTION: This exam was performed according to the departmental dose-optimization program which includes automated exposure control, adjustment of the mA and/or kV according to patient size and/or use of iterative reconstruction technique. COMPARISON:  07/10/2023 FINDINGS: Cardiovascular: Right Port-A-Cath tip: SVC. Coronary, aortic arch, and branch vessel atherosclerotic vascular disease. Mediastinum/Nodes: Fullness of the right hilum and right infrahilar region potentially with adenopathy in this vicinity better shown on the postcontrast imaging of 07/10/2023. Lungs/Pleura: Persistent volume loss and nodularity in the right lower lobe and along the azygoesophageal recess, no change from 07/10/2023. Increased atelectasis medially in the right middle lobe. Anterior right upper lobe nodule 6 by 5 mm on image 62 series 4, previously 5 by 5 mm. Peripheral subpleural consolidation in the right upper lobe, nonspecific. Left upper lobe nodule 5 mm in long axis on image 22 series 4, previously 4 mm. Other small nodules minimally enlarged in the left upper lobe. The dominant left lower lobe nodule measures 1.9 by 1.7 cm on image 95 series 4, previously same on 07/10/2023. Upper Abdomen: Abdominal aortic atherosclerosis. Musculoskeletal: Pectus excavatum, Haller index 3.2. IMPRESSION: 1. Previous smaller nodules are mildly enlarged from 07/10/2023, the larger nodules appear stable. 2. Persistent volume loss and nodularity in the right lower lobe and along the azygoesophageal recess, no change from 07/10/2023. 3. Fullness of the right hilum and right infrahilar region potentially with adenopathy in this vicinity better shown on the postcontrast imaging of  07/10/2023. 4. Pectus excavatum, Haller index 3.2. 5. Aortic atherosclerosis. Aortic Atherosclerosis (ICD10-I70.0). Electronically Signed   By: Gaylyn Rong M.D.   On: 08/05/2023 17:22   DG Chest Port 1 View  Result Date: 08/05/2023 CLINICAL DATA:  Enlarging lung nodule, history of rectal cancer EXAM: PORTABLE CHEST 1 VIEW COMPARISON:  07/10/2023 FINDINGS: Right Port-A-Cath tip: SVC. Atherosclerotic calcification of the aortic arch. Right hilar and infrahilar nodule noted. Retrocardiac nodule poorly seen on this radiograph. No pneumothorax or blunting of the costophrenic angles. The patient is rotated to the right on today's radiograph, reducing diagnostic sensitivity and specificity. IMPRESSION: 1. Right hilar and infrahilar nodule. Retrocardiac nodule poorly seen on today's radiograph. 2. Right Port-A-Cath tip: SVC. 3. No pneumothorax or early postprocedural complicating feature identified. Electronically Signed   By: Gaylyn Rong M.D.   On: 08/05/2023 17:15   DG C-Arm 1-60 Min-No Report  Result Date: 08/05/2023 Fluoroscopy was utilized by the requesting physician.  No radiographic interpretation.   NM Myocar Multi W/Spect W/Wall Motion / EF  Result Date: 07/17/2023   Low risk, probably normal pharmacologic myocardial perfusion stress test.   There is a small in size, severe, fixed defect in the apical lateral segment most consistent with artifact but cannot rule out small area of scar.   No significant ischemia is identified.   Left ventricular systolic function is normal (LVEF greater than 65%).   Coronary artery calcification and aortic atherosclerosis are noted.  Central venous catheter extends into the distal SVC.   Pectus excavatum is evident.    ONCOLOGY HISTORY:  Biopsy confirmed rectal cancer.  [facility] on October 11, 2020 revealed extension of the tumor beyond the wall into the rectovaginal recess with suspected invasion of the vaginal wall posteriorly.   Tumor also appears to involve the internal anal sphincter.  There are also numerous prominent presacral and mesorectal lymph nodes highly suspicious for malignancy.  PET scan results from November 17, 2020 reviewed independently with 3 hypermetabolic  right lower lobe lung nodules consistent with pulmonary metastasis.  Patient completed cycle 8 of FOLFOX on Mar 29, 2021.  She then completed cycle 5 of her 5-FU pump on May 26, 2021 and XRT on June 01, 2021.  PET scan results from July 17, 2021 reviewed independently with no evidence of disease other than enlarging right lower lobe lung lesion.  Patient completed SBRT to the right lung lesion on August 22, 2021.  Repeat PET scan on July 25, 2021 revealed minimal residual disease in the rectum and likely resolution of lesions in the lung.    ASSESSMENT: Recurrent stage IV rectal cancer, history of follicular lymphoma.  PLAN:    Recurrent stage IV rectal cancer: See oncology history as above.  PET scan results from Mar 21, 2023 reviewed independently with clear progression of disease in patient's pelvis as well as multiple pulmonary nodules.  Her CEA initially increased to 29.8, but now has trended down to 13.2.  Hospice and end-of-life care were previously discussed, but patient preferred to pursue treatment.  CT scan results July 13, 2023 reviewed independently and reported as above with overall improvement of disease, although patient has a left lower lobe lesion that has increased in size.  Delay cycle 9 of treatment today secondary to patient actively being in atrial fibrillation.  She will see cardiology later today.  Return to clinic in 1 week for further evaluation and reconsideration of cycle 9.   Left lower lobe lung lesion: Increasing in size despite improvement of disease elsewhere.  Recent bronchoscopy did not reveal any evidence of malignancy.  Continue to monitor closely. Recurrent follicular lymphoma: CT scan results from December 23, 2019 reviewed independently with no obvious evidence of recurrent or progressive disease.  PET scan results as above consistent with rectal cancer metastasis and no evidence of lymphoma.   Patient received Zevalin on July 30, 2017 with not much therapeutic effect.  She subsequently underwent cycles 5 of R-CHOP chemotherapy with Neulasta support completing on December 12, 2017.  Patient continues to be in complete remission.  Neuropathy: Chronic and unchanged. Pelvic pain: Well-controlled with tramadol. Poor appetite/weight loss: Resolved.  Continue Megace as prescribed. Neutropenia: Resolved.  Continue Udenyca with pump removal. Thrombocytopenia: Mild.  Patient's platelet count is 109 today.  Delay treatment 1 week as above.   Hypokalemia: Patient was having a prescription for 20  mEq potassium chloride daily.   Coping/adjustment: Patient was previously given a referral to [facility]. Atrial fibrillation: Confirmed by EKG.  Patient has an appointment with cardiology later today.  Patient expressed understanding and was in agreement with this plan. She also understands that She can call clinic at any time with any questions, concerns, or complaints.    Cancer Staging  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 [facility]) Staging form: Lymphoid Neoplasms, AJCC 6th Edition - Clinical stage from 08/27/2016: Stage IIE - Signed by Jeralyn Ruths, MD on 08/27/2016  Rectal cancer [facility]) Staging form: Colon and Rectum, AJCC 8th Edition - Clinical: Stage IVA Dewaine Oats, [place]) - Signed by Jeralyn Ruths, MD on 11/02/2020   Jeralyn Ruths, MD   08/14/2023 2:16 PM

## 2023-08-14 NOTE — Patient Instructions (Signed)
Medication Instructions:  START Amiodarone 200 mg twice daily for 2 weeks, then decrease to 200 mg once daily.   *If you need a refill on your cardiac medications before your next appointment, please call your pharmacy*   Lab Work: Your provider would like for you to have following labs drawn today TSH, FREE T4.   If you have labs (blood work) drawn today and your tests are completely normal, you will receive your results only by: MyChart Message (if you have MyChart) OR A paper copy in the mail If you have any lab test that is abnormal or we need to change your treatment, we will call you to review the results.   Follow-Up: At [facility], you and your health needs are our priority.  As part of our continuing mission to provide you with exceptional heart care, we have created designated Provider Care Teams.  These Care Teams include your primary Cardiologist (physician) and Advanced Practice Providers (APPs -  Physician Assistants and Nurse Practitioners) who all work together to provide you with the care you need, when you need it.  We recommend signing up for the patient portal called "MyChart".  Sign up information is provided on this After Visit Summary.  MyChart is used to connect with patients for Virtual Visits (Telemedicine).  Patients are able to view lab/test results, encounter notes, upcoming appointments, etc.  Non-urgent messages can be sent to your provider as well.   To learn more about what you can do with MyChart, go to [email or web address].    Your next appointment:   1 month(s)  Provider:   Sherie Don, NP

## 2023-08-14 NOTE — Progress Notes (Signed)
Cardiology Office Note Date:  08/14/2023  Patient ID:  Veronica Boyd, Veronica Boyd 1946-06-14, MRN [id number] PCP:  Barbette Reichmann, MD  Cardiologist:  Julien Nordmann, MD Electrophysiologist: None    Chief Complaint: afib w RVR  History of Present Illness: Veronica Boyd is a 77 y.o. female with PMH notable for parox Afib, rectal cancer; seen today for acute visit d/t afib w RVR at oncology office this morning.  She saw NP Brion Aliment 06/2023. Recent zio monitors showed 13-14% afib burden. She had been recommended to take digoxin, but had increased cough and she was concerned for dig toxicity so stopped. Low dose BB had been previously started. At appt with Brion Aliment, she did not have palpitations or chest pain, but did c/o progressive DOE and was agreeable to [employer] for further eval, which was completed earlier this week and showed a small, fixed defect that could be artifact or scar; no significant ischemia identified.  I saw her in follow-up 07/2023 where she felt much better, no longer SOB with walking 5-10 steps. She did not prefer to start AAD given symptom improvement and we agreed to continue to monitor AFib.   Earlier today, she was at oncology visit and had palpitations. EKG showed afib w RVR at 130s.  She tells me that she could feel her heart racing yesterday, and this morning when she woke up she felt terrible. Was very SOB with walking 5-10 steps.  She denies chest pain, chest pressure. She is largely unaware of afib episodes but is curious about her intermittent SOB is afib symptom.  Had very poor appetite last week after her chemo - has cold sensitivity and so did not tolerate her Boost.  No edema, syncope or presyncope.  She had lab work earlier today at oncology office. Found to be hypokalemic and started on klorcon BID. Has not started yet  She continues to take eliquis 5mg  BID, no bleeding concerns.   AAD History: none  Past Medical History:  Diagnosis Date    Anemia due to antineoplastic chemotherapy    Aortic atherosclerosis (HCC)    Arthritis    Atrial fibrillation and flutter (HCC)    a.) 04/2023 Zio: Monitor 1 - 16 % aflutter burden w/ avg rate of 168, (78-226), possible Atach. Freq PACs, rare PVCs. Monitor 2 - 15% afib/flutter burden @ 115 [phone] Poss Atach. 4 beats NSVT. Freq PACs, rare PVCs; b.)  CHA2DS2-VASc = 4 (age x2, sex, vascular disease history); c.) cardiac rate/rhythm maintained on oral metoprolol succinate; chronically anticoagulated using apixaban   Bronchial obstruction    Chemotherapy-induced neuropathy (HCC)    Chicken pox    Colon polyp    Coronary artery calcification seen on CT scan    a. 11/2022 CT Chest: Coronary artery calcification and aortic atherosclerosis.   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 04/2023 Echo: EF >55%, nl RV size/fxn.  No significant valvular disease observed.   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, cN2a, cM1a)   Thrombocytopenia (HCC)     Past Surgical History:  Procedure Laterality Date   AXILLARY LYMPH NODE DISSECTION Right 08/21/2016   Procedure: AXILLARY LYMPH NODE excision;  Surgeon: Nadeen Landau, MD;  Location: [facility] ORS;  Service: General;  Laterality: Right;   CATARACT EXTRACTION, BILATERAL Bilateral    COLONOSCOPY N/A 09/19/2020   Procedure: COLONOSCOPY;  Surgeon: Regis Bill, MD;  Location: [facility];  Service: Endoscopy;  Laterality: N/A;   ENDOBRONCHIAL ULTRASOUND Left 08/05/2023   Procedure: ENDOBRONCHIAL ULTRASOUND;  Surgeon: Salena Saner, MD;  Location: [facility] ORS;  Service: Pulmonary;  Laterality:  Left;   PORTA CATH INSERTION N/A 09/16/2017   Procedure: PORTA CATH INSERTION;  Surgeon: Annice Needy, MD;  Location: [facility];  Service: Cardiovascular;  Laterality: N/A;   TONSILLECTOMY     TOTAL HIP ARTHROPLASTY Left 1992    Current Outpatient Medications  Medication Instructions   apixaban (ELIQUIS) 5 mg, Oral, 2 times daily   chlorpheniramine-HYDROcodone (TUSSIONEX) 10-8 MG/5ML 5 mLs, Oral, At bedtime PRN   diclofenac sodium (VOLTAREN) 2 g, Topical, 2 times daily PRN   diphenoxylate-atropine (LOMOTIL) 2.5-0.025 MG tablet 2 tablets, Oral, 4 times daily PRN   gabapentin (NEURONTIN) 300 mg, Oral, 2 times daily   lidocaine-prilocaine (EMLA) cream 1 Application, Topical, As needed, Apply to port 1 hour prior to use as needed   megestrol (MEGACE) 40 mg, Oral, Daily   metoprolol succinate (TOPROL XL) 12.5 mg, Oral, Daily at bedtime   ondansetron (ZOFRAN) 8 mg, Oral, Every 8 hours PRN, Start on the third day after chemotherapy.   pantoprazole (PROTONIX) 20 MG tablet 1 tablet, Oral, Daily PRN   Potassium 99 MG TABS 1 tablet, Oral, As needed   potassium chloride SA (KLOR-CON M) 20 MEQ tablet 20 mEq, Oral, 2 times daily   predniSONE (DELTASONE) 10 mg, 2 times daily   simvastatin (ZOCOR) 20 mg, Oral, Daily at bedtime   tiZANidine (ZANAFLEX) 2 MG tablet TAKE 1 TABLET BY MOUTH NIGHTLY AS NEEDED FOR MUSCLE SPASMS.   traMADol-acetaminophen (ULTRACET) 37.5-325 MG tablet 1 tablet, Oral, Every 8 hours PRN    Social History:  The patient  reports that she has never smoked. She has never used smokeless tobacco. She reports that she does not drink alcohol and does not use drugs.   Family History:  The patient's family history includes Basal cell carcinoma in her daughter; Breast cancer in her paternal aunt; Diabetes in her brother and sister; Emphysema in her father; Heart failure in her mother; Lung cancer in her brother.  ROS:  Please see the history of present illness. All other systems  are reviewed and otherwise negative.   PHYSICAL EXAM:  VS:  BP 92/60 (BP Location: Left Arm, Patient Position: Sitting, Cuff Size: Normal)   Pulse 85  BMI: There is no height or weight on file to calculate BMI.  GEN- frail, thin appearing, alert and oriented x 3 today.   Lungs- rhonchi, clears with coughing. normal work of breathing.  Heart- Irregularly irregular rate and rhythm, no murmurs, rubs or gallops Extremities- No peripheral edema, warm, dry   EKG is ordered. Personal review of EKG from today shows:   EKG Interpretation Date/Time:  Wednesday August 14 2023 13:55:32 EDT Ventricular Rate:  101 PR Interval:    QRS Duration:  68 QT Interval:  350 QTC Calculation: 453 R Axis:   34  Text Interpretation: Atrial fibrillation with rapid ventricular response Confirmed by Sherie Don [id number]) on 08/14/2023 2:59:02 PM     Recent Labs: 11/13/2022: B Natriuretic Peptide  67.2 04/16/2023: Magnesium 1.9 08/14/2023: ALT 11; BUN 10; Creatinine 0.71; Hemoglobin 13.9; Platelet Count 109; Potassium 3.2; Sodium 137  No results found for requested labs within last 365 days.   Estimated Creatinine Clearance: 34.2 mL/min (by C-G formula based on SCr of 0.71 mg/dL).   Wt Readings from Last 3 Encounters:  08/14/23 83 lb 11.2 oz (38 kg)  08/05/23 87 lb (39.5 kg)  07/31/23 87 lb (39.5 kg)     Additional studies reviewed include: Previous EP, cardiology notes.   NM myocardial, 07/16/2023   Low risk, probably normal pharmacologic myocardial perfusion stress test.   There is a small in size, severe, fixed defect in the apical lateral segment most consistent with artifact but cannot rule out small area of scar.   No significant ischemia is identified.   Left ventricular systolic function is normal (LVEF greater than 65%).   Coronary artery calcification and aortic atherosclerosis are noted.  Central venous catheter extends into the distal SVC.   Pectus excavatum is evident.  Long term monitor,  06/10/2023 Monitor 1 Normal sinus rhythm Patient had a min HR of 50 bpm, max HR of 226 bpm, and avg HR of 102 bpm.  Atrial Flutter occurred (16% burden), ranging from 78-226 bpm (avg of 126 bpm), the longest lasting 31 mins 49 secs with an avg rate of 168 bpm.  Possible atrial tachycardia Isolated SVEs were frequent (7.0%, [id number]), SVE Couplets were rare (<1.0%, 1946), and SVE Triplets were rare (<1.0%, 824).  Isolated VEs were rare (<1.0%), VE Couplets were rare (<1.0%), and no VE Triplets were present.    Monitor 2 Normal sinus rhythm Patient had a min HR of 48 bpm, max HR of 235 bpm, and avg HR of 100 bpm.   1 run of Ventricular Tachycardia occurred lasting 4 beats with a max rate of 226 bpm (avg 202 bpm).   Atrial Fibrillation/Flutter occurred (15% burden), ranging from 66-235 bpm (avg of 115 bpm), the longest lasting 22 mins 56 secs with an avg rate of 102 bpm.  Possible atrial tachycardia.  Isolated SVEs were frequent (9.0%, [id number]), SVE Couplets were occasional (1.4%, 14036), and SVE Triplets were rare (<1.0%, 6711).  Isolated VEs were rare (<1.0%), VE Couplets were rare (<1.0%), and no VE Triplets were present.   TTE, 04/08/2023  1. Left ventricular ejection fraction, by estimation, is >55%. The left ventricle has normal function. Left ventricular endocardial border not optimally defined to evaluate regional wall motion. Left ventricular diastolic function could not be evaluated.   2. Right ventricular systolic function is normal. The right ventricular size is normal.   3. The mitral valve was not well visualized. No evidence of mitral valve  regurgitation.   4. Tricuspid valve regurgitation not well assessed.   5. The aortic valve was not well visualized. Aortic valve regurgitation not well assessed.   6. Pulmonic valve regurgitation not well assessed.   ASSESSMENT AND PLAN:  #) parox afib w rvr Recent zio showed 15-16% afib burden She had previously wanted to avoid additional  medications for AFib, though is now agreeable to starting AAD Dicussed possible amiodarone interaction with patient's chemo - Dr. Orlie Dakin was not aware of any interaction Will initiate amiodarone 200mg  BID x 2 weeks, then 200mg  daily LFTs stable this AM Update Thyroid labs today Continue 12.5mg  toprol daily Recommend to continue monitoring pulse and BP at home, notify office if becomes hypotensive or bradycardic  #) Hypercoag d/t parox afib CHA2DS2-VASc Score = 4 [CHF History: 0,  HTN History: 0, Diabetes History: 0, Stroke History: 0, Vascular Disease History: 1, Age Score: 2, Gender Score: 1].  Therefore, the patient's annual risk of stroke is 4.8 %.    Stroke ppx - 5mg  eliquis BID, appropriately dosed. Will need dose reduction to 2.5mg  in 11/2023  No bleeding concerns   #) recurrent stage IV rectal cancer Follows regularly with Dr. Orlie Dakin, oncology Encouraged frequent snacks and meals high in protein.       Current medicines are reviewed at length with the patient today.   The patient does not have concerns regarding her medicines.  The following changes were made today:   START 200mg  amiodarone BID x 2 weeks, then 200mg  daily  Labs/ tests ordered today include:  Orders Placed This Encounter  Procedures   TSH   T4, free   EKG 12-Lead     Disposition: Follow up with EP APP in 4 weeks   Signed, Sherie Don, NP  08/14/23  1:48 PM  Electrophysiology [facility]

## 2023-08-15 LAB — TSH: TSH: 2 u[IU]/mL (ref 0.450–4.500)

## 2023-08-15 LAB — T4, FREE: Free T4: 1.59 ng/dL (ref 0.82–1.77)

## 2023-08-16 ENCOUNTER — Inpatient Hospital Stay: Payer: 59

## 2023-08-16 LAB — PROTEIN ELECTRO, RANDOM URINE
Albumin ELP, Urine: 65.9 %
Alpha-1-Globulin, U: 5.2 %
Alpha-2-Globulin, U: 7.4 %
Beta Globulin, U: 17.2 %
Gamma Globulin, U: 4.4 %
Total Protein, Urine: 43.5 mg/dL

## 2023-08-16 LAB — CEA: CEA: 8 ng/mL — ABNORMAL HIGH (ref 0.0–4.7)

## 2023-08-19 ENCOUNTER — Encounter: Payer: Self-pay | Admitting: Oncology

## 2023-08-20 ENCOUNTER — Ambulatory Visit (INDEPENDENT_AMBULATORY_CARE_PROVIDER_SITE_OTHER): Payer: 59 | Admitting: Pulmonary Disease

## 2023-08-20 ENCOUNTER — Encounter: Payer: Self-pay | Admitting: Pulmonary Disease

## 2023-08-20 VITALS — BP 130/70 | HR 104 | Temp 97.6°F | Ht <= 58 in | Wt 82.8 lb

## 2023-08-20 DIAGNOSIS — R0602 Shortness of breath: Secondary | ICD-10-CM

## 2023-08-20 DIAGNOSIS — C7800 Secondary malignant neoplasm of unspecified lung: Secondary | ICD-10-CM

## 2023-08-20 DIAGNOSIS — C8208 Follicular lymphoma grade I, lymph nodes of multiple sites: Secondary | ICD-10-CM | POA: Diagnosis not present

## 2023-08-20 DIAGNOSIS — I480 Paroxysmal atrial fibrillation: Secondary | ICD-10-CM | POA: Diagnosis not present

## 2023-08-20 DIAGNOSIS — C20 Malignant neoplasm of rectum: Secondary | ICD-10-CM | POA: Diagnosis not present

## 2023-08-20 DIAGNOSIS — R053 Chronic cough: Secondary | ICD-10-CM

## 2023-08-20 MED ORDER — PREDNISONE 20 MG PO TABS: 20.0000 mg | ORAL_TABLET | Freq: Every day | ORAL | 0 refills | Status: AC

## 2023-08-20 MED ORDER — QVAR REDIHALER 80 MCG/ACT IN AERB: 2.0000 | INHALATION_SPRAY | Freq: Two times a day (BID) | RESPIRATORY_TRACT | 4 refills | Status: DC

## 2023-08-20 MED FILL — Dexamethasone Sodium Phosphate Inj 100 MG/10ML: INTRAMUSCULAR | Qty: 1 | Status: AC

## 2023-08-20 NOTE — Patient Instructions (Addendum)
VISIT SUMMARY:  During your visit, we discussed your ongoing issues with shortness of breath and a fast heart rate, which are likely related to the inflammation in your right lung. We also touched on your colorectal cancer treatment, which is currently on hold due to your overall health condition.  YOUR PLAN:  -FAST HEART RATE: Your heart rate is consistently over 100, which is faster than normal and could be causing your shortness of breath. At this time, we are not making any changes to how we are managing this.  -LUNG INFLAMMATION: You have ongoing inflammation in the middle part of your right lung. We are going to start you on a 5-day course of Prednisone, a medication that reduces inflammation. We will also start you on a steroid inhaler, which should help reduce the inflammation in your lungs. Remember to rinse your mouth thoroughly after using the inhaler to prevent a fungal infection called thrush.  -COLORECTAL CANCER: Your colorectal cancer treatment is being managed by Dr. Orlie Dakin. We are currently waiting for the results of a gene test, which could help [place] adjust your treatment plan. We will communicate these results to Dr. Orlie Dakin as soon as we receive them.  INSTRUCTIONS:  Please start taking Prednisone 20mg  daily for 5 days and use the steroid inhaler as instructed. Remember to rinse your mouth thoroughly after using the inhaler. We will contact you with the results of your gene test as soon as they are available.  Will see you in follow-up in 4 to 6 weeks time call sooner should any new problems arise.

## 2023-08-20 NOTE — Progress Notes (Signed)
Subjective:    Patient ID: Veronica Boyd, female    DOB: [birth year], 77 y.o.   MRN: [id number]  Patient Care Team: Barbette Reichmann, MD as PCP - General (Internal Medicine) Antonieta Iba, MD as PCP - Cardiology (Cardiology) Jeralyn Ruths, MD as Consulting Physician (Oncology) Benita Gutter, RN as Oncology Nurse Navigator Salena Saner, MD as Consulting Physician (Pulmonary Disease)  Chief Complaint  Patient presents with   Follow-up    Shortness of breath on exertion. No cough or wheezing.    BACKGROUND/INTERVAL:Veronica Boyd is a 77 year old lifelong never smoker who follows up for pulmonary nodules in the setting of stage IV rectal cancer and a history of follicular lymphoma.  We last saw the patient on 23 July 2023.  She underwent bronchoscopy on 05 August 2023.  This is a post bronchoscopy visit.  HPI  Discussed the use of AI scribe software for clinical note transcription with the patient, who gave verbal consent to proceed.  History of Present Illness   The patient, with a history of colorectal cancer and associated lung inflammation, presents with worsening shortness of breath and persistent tachycardia. She reports a particularly severe episode the previous day, during which she was unable to get out of bed or walk.  The patient denies productive cough, despite ongoing inflammation in the middle part of the right lung. She previously underwent bronchoscopy, which provided temporary relief but the issue persists.  In the past, a short course of prednisone prescribed by her primary care physician seemed to improve her symptoms and overall mobility. However, the beneficial effects were temporary and symptoms returned after the course was completed.  The patient's treatment plan also includes chemotherapy, which was recently paused due to the patient's deteriorating condition.      Review of Systems A 10 point review of systems was performed  and it is as noted above otherwise negative.   Patient Active Problem List   Diagnosis Date Noted   Paroxysmal atrial fibrillation (HCC) 07/02/2023   Hypercoagulable state due to paroxysmal atrial fibrillation (HCC) 07/02/2023   Lung nodule 10/05/2022   Bronchial obstruction 10/05/2022   Chemotherapy-induced neuropathy (HCC) 07/21/2021   Rectal cancer (HCC) 11/02/2020   Rectal mass 10/11/2020   Gastroesophageal reflux disease without esophagitis 08/25/2018   Protein-calorie malnutrition, severe 12/20/2017   Anemia    Thrombocytopenia (HCC)    Sepsis (HCC) 12/19/2017   Anemia due to antineoplastic chemotherapy 12/17/2017   Goals of care, counseling/discussion 09/19/2017   Axillary mass, right 04/03/2017   Pure hypercholesterolemia 09/10/2016   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) 07/24/2016   Mass of right chest wall 06/06/2016   Primary osteoarthritis involving multiple joints 12/14/2015   Transient insomnia 08/27/2015   Mixed hyperlipidemia 03/01/2015   Nocturnal muscle cramps 03/01/2015   Osteoporosis 11/08/2014   Primary osteoarthritis of both knees 07/05/2014   Hyperlipidemia 03/19/2014   Osteoarthritis of knee 03/19/2014   Abnormal mammogram 03/03/2013   Vitamin D deficiency 05/09/2012    Social History   Tobacco Use   Smoking status: Never   Smokeless tobacco: Never  Substance Use Topics   Alcohol use: No    No Known Allergies  Current Meds  Medication Sig   amiodarone (PACERONE) 200 MG tablet Take 1 tablet (200 mg) by mouth twice daily for 2 weeks, then decrease to 1 tablet (200 mg) by mouth daily.   apixaban (ELIQUIS) 5 MG TABS tablet Take  1 tablet (5 mg total) by mouth 2 (two) times daily.   diclofenac sodium (VOLTAREN) 1 % GEL Apply 2 g topically 2 (two) times daily as needed.   diphenoxylate-atropine (LOMOTIL) 2.5-0.025 MG tablet TAKE 2 TABLETS BY MOUTH 4 (FOUR) TIMES DAILY AS NEEDED  FOR DIARRHEA OR LOOSE STOOLS.   gabapentin (NEURONTIN) 300 MG capsule TAKE 1 CAPSULE BY MOUTH TWICE A DAY   lidocaine-prilocaine (EMLA) cream Apply 1 Application topically as needed. Apply to port 1 hour prior to use as needed   megestrol (MEGACE) 40 MG tablet Take 1 tablet (40 mg total) by mouth daily.   metoprolol succinate (TOPROL XL) 25 MG 24 hr tablet Take 0.5 tablets (12.5 mg total) by mouth at bedtime.   ondansetron (ZOFRAN) 8 MG tablet Take 1 tablet (8 mg total) by mouth every 8 (eight) hours as needed for nausea or vomiting. Start on the third day after chemotherapy.   pantoprazole (PROTONIX) 20 MG tablet Take 1 tablet by mouth daily as needed for heartburn.   potassium chloride SA (KLOR-CON M) 20 MEQ tablet Take 1 tablet (20 mEq total) by mouth 2 (two) times daily.   simvastatin (ZOCOR) 20 MG tablet Take 20 mg by mouth at bedtime.   traMADol-acetaminophen (ULTRACET) 37.5-325 MG tablet Take 1 tablet by mouth every 8 (eight) hours as needed.    Immunization History  Administered Date(s) Administered   Influenza-Unspecified 09/20/2014   PFIZER(Purple Top)SARS-COV-2 Vaccination 01/07/2020, 02/02/2020   Unspecified SARS-COV-2 Vaccination 02/02/2020   Zoster Recombinant(Shingrix) 11/29/2019, 03/20/2020   Zoster, Live 09/20/2014        Objective:   BP 130/70 (BP Location: Left Arm, Patient Position: Sitting, Cuff Size: Normal)   Pulse (!) 104   Temp 97.6 F (36.4 C) (Temporal)   Ht 4\' 10"  (1.473 m)   Wt 82 lb 12.8 oz (37.6 kg)   SpO2 96%   BMI 17.31 kg/m   SpO2: 96 %  GENERAL: Thin, elderly female, pale, presents in transport chair.  No conversational dyspnea. HEAD: Normocephalic, atraumatic.  EYES: Pupils equal, round, reactive to light.  No scleral icterus.  MOUTH: Poor dentition.  She has had 12 teeth extracted which were previously noted to be chipped and in poor repair. NECK: Supple. No thyromegaly. Trachea midline. No JVD.  No adenopathy. PULMONARY: Pectus  excavatum, good air entry bilaterally.  No adventitious sounds. CARDIOVASCULAR: S1 and S2.  Regular rate and rhythm.  No rubs, murmurs or gallops heard. ABDOMEN: Scaphoid otherwise benign. MUSCULOSKELETAL: No joint deformity, no clubbing, no edema.  NEUROLOGIC: Grossly nonfocal.  Fully ambulatory.  Speech is fluent. SKIN: Intact,warm,dry. PSYCH: Mood and behavior normal.   Assessment & Plan:     ICD-10-CM   1. 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 Paroxysmal atrial fibrillation (HCC)  I48.0     3. 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 SOB (shortness of breath)  R06.02     5. Chronic cough  R05.3      Meds ordered this encounter  Medications   beclomethasone (QVAR REDIHALER) 80 MCG/ACT inhaler    Sig: Inhale 2 puffs into the lungs 2 (two) times daily.    Dispense:  10.6 g    Refill:  4   predniSONE (DELTASONE) 20 MG tablet    Sig: Take 1 tablet (20 mg total) by mouth daily with breakfast for 5 days.    Dispense:  5 tablet    Refill:  0   Assessment and Plan    Tachycardia Persistent heart rate over 100, likely contributing to shortness of breath. No wheezing noted on examination. -No change in management at this time.  Lung Inflammation History of inflammation in the middle part of the right lung. Previous response to prednisone noted. -Start Prednisone 20mg  daily for 5 days. -Start steroid inhaler, QVAR 80 mcg, concurrently, instructed patient to rinse mouth thoroughly after use to prevent thrush.  Colorectal Cancer Ongoing treatment under Dr. Orlie Dakin. Recent testing for potential mutations. -Await results of [employer] test, expected early next week. -Communicate results to Dr. Orlie Dakin for potential treatment adjustment.   Will see patient in follow-up in 4 to 6 weeks time she is to contact us prior to that time should any new difficulties arise.     Gailen Shelter, MD Advanced  Bronchoscopy PCCM [facility]    *This note was dictated using voice recognition software/Dragon.  Despite best efforts to proofread, errors can occur which can change the meaning. Any transcriptional errors that result from this process are unintentional and may not be fully corrected at the time of dictation.

## 2023-08-21 ENCOUNTER — Other Ambulatory Visit: Payer: Self-pay | Admitting: Oncology

## 2023-08-21 ENCOUNTER — Inpatient Hospital Stay: Payer: 59

## 2023-08-21 ENCOUNTER — Inpatient Hospital Stay (HOSPITAL_BASED_OUTPATIENT_CLINIC_OR_DEPARTMENT_OTHER): Payer: 59 | Admitting: Oncology

## 2023-08-21 ENCOUNTER — Encounter: Payer: Self-pay | Admitting: Oncology

## 2023-08-21 DIAGNOSIS — C20 Malignant neoplasm of rectum: Secondary | ICD-10-CM

## 2023-08-21 DIAGNOSIS — Z5112 Encounter for antineoplastic immunotherapy: Secondary | ICD-10-CM | POA: Diagnosis not present

## 2023-08-21 LAB — CMP (CANCER CENTER ONLY)
ALT: 13 U/L (ref 0–44)
AST: 19 U/L (ref 15–41)
Albumin: 3.7 g/dL (ref 3.5–5.0)
Alkaline Phosphatase: 69 U/L (ref 38–126)
Anion gap: 9 (ref 5–15)
BUN: 11 mg/dL (ref 8–23)
CO2: 26 mmol/L (ref 22–32)
Calcium: 9.2 mg/dL (ref 8.9–10.3)
Chloride: 101 mmol/L (ref 98–111)
Creatinine: 0.73 mg/dL (ref 0.44–1.00)
GFR, Estimated: >60 mL/min (ref >60)
Glucose, Bld: 122 mg/dL — ABNORMAL HIGH (ref 70–99)
Potassium: 4.3 mmol/L (ref 3.5–5.1)
Sodium: 136 mmol/L (ref 135–145)
Total Bilirubin: 0.6 mg/dL (ref 0.3–1.2)
Total Protein: 7 g/dL (ref 6.5–8.1)

## 2023-08-21 LAB — CBC WITH DIFFERENTIAL (CANCER CENTER ONLY)
Abs Immature Granulocytes: 0.05 10*3/uL (ref 0.00–0.07)
Basophils Absolute: 0 10*3/uL (ref 0.0–0.1)
Basophils Relative: 0 %
Eosinophils Absolute: 0 10*3/uL (ref 0.0–0.5)
Eosinophils Relative: 1 %
HCT: 38.6 % (ref 36.0–46.0)
Hemoglobin: 12.6 g/dL (ref 12.0–15.0)
Immature Granulocytes: 1 %
Lymphocytes Relative: 23 %
Lymphs Abs: 1.8 10*3/uL (ref 0.7–4.0)
MCH: 32.8 pg (ref 26.0–34.0)
MCHC: 32.6 g/dL (ref 30.0–36.0)
MCV: 100.5 fL — ABNORMAL HIGH (ref 80.0–100.0)
Monocytes Absolute: 1.5 10*3/uL — ABNORMAL HIGH (ref 0.1–1.0)
Monocytes Relative: 19 %
Neutro Abs: 4.4 10*3/uL (ref 1.7–7.7)
Neutrophils Relative %: 56 %
Platelet Count: 194 10*3/uL (ref 150–400)
RBC: 3.84 MIL/uL — ABNORMAL LOW (ref 3.87–5.11)
RDW: 14.2 % (ref 11.5–15.5)
WBC Count: 7.8 10*3/uL (ref 4.0–10.5)
nRBC: 0 % (ref 0.0–0.2)

## 2023-08-21 MED ORDER — DEXTROSE 5 % IV SOLN
Freq: Once | INTRAVENOUS | Status: AC
  Filled 2023-08-21: qty 250

## 2023-08-21 MED ORDER — DIPHENHYDRAMINE HCL 50 MG/ML IJ SOLN
25.0000 mg | Freq: Once | INTRAMUSCULAR | Status: AC
  Administered 2023-08-21: 25 mg via INTRAVENOUS
  Filled 2023-08-21: qty 1

## 2023-08-21 MED ORDER — SODIUM CHLORIDE 0.9 % IV SOLN
2400.0000 mg/m2 | INTRAVENOUS | Status: DC
  Administered 2023-08-21: 3000 mg via INTRAVENOUS
  Filled 2023-08-21: qty 60

## 2023-08-21 MED ORDER — FLUOROURACIL CHEMO INJECTION 500 MG/10ML
400.0000 mg/m2 | Freq: Once | INTRAVENOUS | Status: AC
  Administered 2023-08-21: 500 mg via INTRAVENOUS
  Filled 2023-08-21: qty 10

## 2023-08-21 MED ORDER — SODIUM CHLORIDE 0.9 % IV SOLN
10.0000 mg | Freq: Once | INTRAVENOUS | Status: AC
  Administered 2023-08-21: 10 mg via INTRAVENOUS
  Filled 2023-08-21: qty 10
  Filled 2023-08-21: qty 1

## 2023-08-21 MED ORDER — SODIUM CHLORIDE 0.9 % IV SOLN
5.0000 mg/kg | Freq: Once | INTRAVENOUS | Status: AC
  Administered 2023-08-21: 200 mg via INTRAVENOUS
  Filled 2023-08-21: qty 8

## 2023-08-21 MED ORDER — OXALIPLATIN CHEMO INJECTION 100 MG/20ML
85.0000 mg/m2 | Freq: Once | INTRAVENOUS | Status: AC
  Administered 2023-08-21: 100 mg via INTRAVENOUS
  Filled 2023-08-21: qty 20

## 2023-08-21 MED ORDER — FAMOTIDINE IN NACL 20-0.9 MG/50ML-% IV SOLN
20.0000 mg | Freq: Once | INTRAVENOUS | Status: AC
  Administered 2023-08-21: 20 mg via INTRAVENOUS
  Filled 2023-08-21: qty 50

## 2023-08-21 MED ORDER — LEUCOVORIN CALCIUM INJECTION 350 MG
400.0000 mg/m2 | Freq: Once | INTRAVENOUS | Status: AC
  Administered 2023-08-21: 496 mg via INTRAVENOUS
  Filled 2023-08-21: qty 24.8

## 2023-08-21 MED ORDER — PALONOSETRON HCL INJECTION 0.25 MG/5ML
0.2500 mg | Freq: Once | INTRAVENOUS | Status: AC
  Administered 2023-08-21: 0.25 mg via INTRAVENOUS
  Filled 2023-08-21: qty 5

## 2023-08-21 NOTE — Patient Instructions (Signed)
[place] [facility]  Discharge Instructions: Thank you for choosing [facility] to provide your oncology and hematology care.  If you have a lab appointment with the Cancer Center, please go directly to the Cancer Center and check in at the registration area.  Wear comfortable clothing and clothing appropriate for easy access to any Portacath or PICC line.   We strive to give you quality time with your provider. You may need to reschedule your appointment if you arrive late (15 or more minutes).  Arriving late affects you and other patients whose appointments are after yours.  Also, if you miss three or more appointments without notifying the office, you may be dismissed from the clinic at the provider's discretion.      For prescription refill requests, have your pharmacy contact our office and allow 72 hours for refills to be completed.    Today you received the following chemotherapy and/or immunotherapy agents MVASI, OXALIPLATIN, LEUCOVORIN, 5 FU      To help prevent nausea and vomiting after your treatment, we encourage you to take your nausea medication as directed.  BELOW ARE SYMPTOMS THAT SHOULD BE REPORTED IMMEDIATELY: *FEVER GREATER THAN 100.4 F (38 C) OR HIGHER *CHILLS OR SWEATING *NAUSEA AND VOMITING THAT IS NOT CONTROLLED WITH YOUR NAUSEA MEDICATION *UNUSUAL SHORTNESS OF BREATH *UNUSUAL BRUISING OR BLEEDING *URINARY PROBLEMS (pain or burning when urinating, or frequent urination) *BOWEL PROBLEMS (unusual diarrhea, constipation, pain near the anus) TENDERNESS IN MOUTH AND THROAT WITH OR WITHOUT PRESENCE OF ULCERS (sore throat, sores in mouth, or a toothache) UNUSUAL RASH, SWELLING OR PAIN  UNUSUAL VAGINAL DISCHARGE OR ITCHING   Items with * indicate a potential emergency and should be followed up as soon as possible or go to the Emergency Department if any problems should occur.  Please show the CHEMOTHERAPY ALERT CARD or IMMUNOTHERAPY  ALERT CARD at check-in to the Emergency Department and triage nurse.  Should you have questions after your visit or need to cancel or reschedule your appointment, please contact [place] [facility]  [phone] and follow the prompts.  Office hours are 8:00 a.m. to 4:30 p.m. Monday - Friday. Please note that voicemails left after 4:00 p.m. may not be returned until the following business day.  We are closed weekends and major holidays. You have access to a nurse at all times for urgent questions. Please call the main number to the clinic [phone] and follow the prompts.  For any non-urgent questions, you may also contact your provider using MyChart. We now offer e-Visits for anyone 22 and older to request care online for non-urgent symptoms. For details visit [email or web address].   Also download the MyChart app! Go to the app store, search "MyChart", open the app, select [facility], and log in with your MyChart username and password. Bevacizumab Injection What is this medication? BEVACIZUMAB (be va SIZ yoo mab) treats some types of cancer. It works by blocking a protein that causes cancer cells to grow and multiply. This helps to slow or stop the spread of cancer cells. It is a monoclonal antibody. This medicine may be used for other purposes; ask your health care provider or pharmacist if you have questions. COMMON BRAND NAME(S): Alymsys, Avastin, MVASI, Omer Jack What should I tell my care team before I take this medication? They need to know if you have any of these conditions: Blood clots Coughing up blood Having or recent surgery Heart failure High blood pressure History  of a connection between 2 or more body parts that do not usually connect (fistula) History of a tear in your stomach or intestines Protein in your urine An unusual or allergic reaction to bevacizumab, other medications, foods, dyes, or preservatives Pregnant or trying to get  pregnant Breast-feeding How should I use this medication? This medication is injected into a vein. It is given by your care team in a hospital or clinic setting. Talk to your care team the use of this medication in children. Special care may be needed. Overdosage: If you think you have taken too much of this medicine contact a poison control center or emergency room at once. NOTE: This medicine is only for you. Do not share this medicine with others. What if I miss a dose? Keep appointments for follow-up doses. It is important not to miss your dose. Call your care team if you are unable to keep an appointment. What may interact with this medication? Interactions are not expected. This list may not describe all possible interactions. Give your health care provider a list of all the medicines, herbs, non-prescription drugs, or dietary supplements you use. Also tell them if you smoke, drink alcohol, or use illegal drugs. Some items may interact with your medicine. What should I watch for while using this medication? Your condition will be monitored carefully while you are receiving this medication. You may need blood work while taking this medication. This medication may make you feel generally unwell. This is not uncommon as chemotherapy can affect healthy cells as well as cancer cells. Report any side effects. Continue your course of treatment even though you feel ill unless your care team tells you to stop. This medication may increase your risk to bruise or bleed. Call your care team if you notice any unusual bleeding. Before having surgery, talk to your care team to make sure it is ok. This medication can increase the risk of poor healing of your surgical site or wound. You will need to stop this medication for 28 days before surgery. After surgery, wait at least 28 days before restarting this medication. Make sure the surgical site or wound is healed enough before restarting this medication. Talk  to your care team if questions. Talk to your care team if you may be pregnant. Serious birth defects can occur if you take this medication during pregnancy and for 6 months after the last dose. Contraception is recommended while taking this medication and for 6 months after the last dose. Your care team can help you find the option that works for you. Do not breastfeed while taking this medication and for 6 months after the last dose. This medication can cause infertility. Talk to your care team if you are concerned about your fertility. What side effects may I notice from receiving this medication? Side effects that you should report to your care team as soon as possible: Allergic reactions--skin rash, itching, hives, swelling of the face, lips, tongue, or throat Bleeding--bloody or black, tar-like stools, vomiting blood or brown material that looks like coffee grounds, red or dark brown urine, small red or purple spots on skin, unusual bruising or bleeding Blood clot--pain, swelling, or warmth in the leg, shortness of breath, chest pain Heart attack--pain or tightness in the chest, shoulders, arms, or jaw, nausea, shortness of breath, cold or clammy skin, feeling faint or lightheaded Heart failure--shortness of breath, swelling of the ankles, feet, or hands, sudden weight gain, unusual weakness or fatigue Increase in blood pressure  Infection--fever, chills, cough, sore throat, wounds that don't heal, pain or trouble when passing urine, general feeling of discomfort or being unwell Infusion reactions--chest pain, shortness of breath or trouble breathing, feeling faint or lightheaded Kidney injury--decrease in the amount of urine, swelling of the ankles, hands, or feet Stomach pain that is severe, does not go away, or gets worse Stroke--sudden numbness or weakness of the face, arm, or leg, trouble speaking, confusion, trouble walking, loss of balance or coordination, dizziness, severe headache,  change in vision Sudden and severe headache, confusion, change in vision, seizures, which may be signs of posterior reversible encephalopathy syndrome (PRES) Side effects that usually do not require medical attention (report to your care team if they continue or are bothersome): Back pain Change in taste Diarrhea Dry skin Increased tears Nosebleed This list may not describe all possible side effects. Call your doctor for medical advice about side effects. You may report side effects to FDA at 1-800-FDA-1088. Where should I keep my medication? This medication is given in a hospital or clinic. It will not be stored at home. NOTE: This sheet is a summary. It may not cover all possible information. If you have questions about this medicine, talk to your doctor, pharmacist, or health care provider.  2024 Elsevier/Gold Standard (2022-03-09 00:00:00)   Oxaliplatin Injection What is this medication? OXALIPLATIN (ox AL i PLA tin) treats colorectal cancer. It works by slowing down the growth of cancer cells. This medicine may be used for other purposes; ask your health care provider or pharmacist if you have questions. COMMON BRAND NAME(S): Eloxatin What should I tell my care team before I take this medication? They need to know if you have any of these conditions: Heart disease History of irregular heartbeat or rhythm Liver disease Low blood cell levels (white cells, red cells, and platelets) Lung or breathing disease, such as asthma Take medications that treat or prevent blood clots Tingling of the fingers, toes, or other nerve disorder An unusual or allergic reaction to oxaliplatin, other medications, foods, dyes, or preservatives If you or your partner are pregnant or trying to get pregnant Breast-feeding How should I use this medication? This medication is injected into a vein. It is given by your care team in a hospital or clinic setting. Talk to your care team about the use of this  medication in children. Special care may be needed. Overdosage: If you think you have taken too much of this medicine contact a poison control center or emergency room at once. NOTE: This medicine is only for you. Do not share this medicine with others. What if I miss a dose? Keep appointments for follow-up doses. It is important not to miss a dose. Call your care team if you are unable to keep an appointment. What may interact with this medication? Do not take this medication with any of the following: Cisapride Dronedarone Pimozide Thioridazine This medication may also interact with the following: Aspirin and aspirin-like medications Certain medications that treat or prevent blood clots, such as warfarin, apixaban, dabigatran, and rivaroxaban Cisplatin Cyclosporine Diuretics Medications for infection, such as acyclovir, adefovir, amphotericin B, bacitracin, cidofovir, foscarnet, ganciclovir, gentamicin, pentamidine, vancomycin NSAIDs, medications for pain and inflammation, such as ibuprofen or naproxen Other medications that cause heart rhythm changes Pamidronate Zoledronic acid This list may not describe all possible interactions. Give your health care provider a list of all the medicines, herbs, non-prescription drugs, or dietary supplements you use. Also tell them if you smoke, drink alcohol, or  use illegal drugs. Some items may interact with your medicine. What should I watch for while using this medication? Your condition will be monitored carefully while you are receiving this medication. You may need blood work while taking this medication. This medication may make you feel generally unwell. This is not uncommon as chemotherapy can affect healthy cells as well as cancer cells. Report any side effects. Continue your course of treatment even though you feel ill unless your care team tells you to stop. This medication may increase your risk of getting an infection. Call your care  team for advice if you get a fever, chills, sore throat, or other symptoms of a cold or flu. Do not treat yourself. Try to avoid being around people who are sick. Avoid taking medications that contain aspirin, acetaminophen, ibuprofen, naproxen, or ketoprofen unless instructed by your care team. These medications may hide a fever. Be careful brushing or flossing your teeth or using a toothpick because you may get an infection or bleed more easily. If you have any dental work done, tell your dentist you are receiving this medication. This medication can make you more sensitive to cold. Do not drink cold drinks or use ice. Cover exposed skin before coming in contact with cold temperatures or cold objects. When out in cold weather wear warm clothing and cover your mouth and nose to warm the air that goes into your lungs. Tell your care team if you get sensitive to the cold. Talk to your care team if you or your partner are pregnant or think either of you might be pregnant. This medication can cause serious birth defects if taken during pregnancy and for 9 months after the last dose. A negative pregnancy test is required before starting this medication. A reliable form of contraception is recommended while taking this medication and for 9 months after the last dose. Talk to your care team about effective forms of contraception. Do not father a child while taking this medication and for 6 months after the last dose. Use a condom while having sex during this time period. Do not breastfeed while taking this medication and for 3 months after the last dose. This medication may cause infertility. Talk to your care team if you are concerned about your fertility. What side effects may I notice from receiving this medication? Side effects that you should report to your care team as soon as possible: Allergic reactions--skin rash, itching, hives, swelling of the face, lips, tongue, or throat Bleeding--bloody or black,  tar-like stools, vomiting blood or brown material that looks like coffee grounds, red or dark brown urine, small red or purple spots on skin, unusual bruising or bleeding Dry cough, shortness of breath or trouble breathing Heart rhythm changes--fast or irregular heartbeat, dizziness, feeling faint or lightheaded, chest pain, trouble breathing Infection--fever, chills, cough, sore throat, wounds that don't heal, pain or trouble when passing urine, general feeling of discomfort or being unwell Liver injury--right upper belly pain, loss of appetite, nausea, light-colored stool, dark yellow or brown urine, yellowing skin or eyes, unusual weakness or fatigue Low red blood cell level--unusual weakness or fatigue, dizziness, headache, trouble breathing Muscle injury--unusual weakness or fatigue, muscle pain, dark yellow or brown urine, decrease in amount of urine Pain, tingling, or numbness in the hands or feet Sudden and severe headache, confusion, change in vision, seizures, which may be signs of posterior reversible encephalopathy syndrome (PRES) Unusual bruising or bleeding Side effects that usually do not require medical attention (report  to your care team if they continue or are bothersome): Diarrhea Nausea Pain, redness, or swelling with sores inside the mouth or throat Unusual weakness or fatigue Vomiting This list may not describe all possible side effects. Call your doctor for medical advice about side effects. You may report side effects to FDA at 1-800-FDA-1088. Where should I keep my medication? This medication is given in a hospital or clinic. It will not be stored at home. NOTE: This sheet is a summary. It may not cover all possible information. If you have questions about this medicine, talk to your doctor, pharmacist, or health care provider.  2024 Elsevier/Gold Standard (2022-08-07 00:00:00)  Leucovorin Injection What is this medication? LEUCOVORIN (loo koe VOR in) prevents side  effects from certain medications, such as methotrexate. It works by increasing folate levels. This helps protect healthy cells in your body. It may also be used to treat anemia caused by low levels of folate. It can also be used with fluorouracil, a type of chemotherapy, to treat colorectal cancer. It works by increasing the effects of fluorouracil in the body. This medicine may be used for other purposes; ask your health care provider or pharmacist if you have questions. What should I tell my care team before I take this medication? They need to know if you have any of these conditions: Anemia from low levels of vitamin B12 in the blood An unusual or allergic reaction to leucovorin, folic acid, other medications, foods, dyes, or preservatives Pregnant or trying to get pregnant Breastfeeding How should I use this medication? This medication is injected into a vein or a muscle. It is given by your care team in a hospital or clinic setting. Talk to your care team about the use of this medication in children. Special care may be needed. Overdosage: If you think you have taken too much of this medicine contact a poison control center or emergency room at once. NOTE: This medicine is only for you. Do not share this medicine with others. What if I miss a dose? Keep appointments for follow-up doses. It is important not to miss your dose. Call your care team if you are unable to keep an appointment. What may interact with this medication? Capecitabine Fluorouracil Phenobarbital Phenytoin Primidone Trimethoprim;sulfamethoxazole This list may not describe all possible interactions. Give your health care provider a list of all the medicines, herbs, non-prescription drugs, or dietary supplements you use. Also tell them if you smoke, drink alcohol, or use illegal drugs. Some items may interact with your medicine. What should I watch for while using this medication? Your condition will be monitored  carefully while you are receiving this medication. This medication may increase the side effects of 5-fluorouracil. Tell your care team if you have diarrhea or mouth sores that do not get better or that get worse. What side effects may I notice from receiving this medication? Side effects that you should report to your care team as soon as possible: Allergic reactions--skin rash, itching, hives, swelling of the face, lips, tongue, or throat This list may not describe all possible side effects. Call your doctor for medical advice about side effects. You may report side effects to FDA at 1-800-FDA-1088. Where should I keep my medication? This medication is given in a hospital or clinic. It will not be stored at home. NOTE: This sheet is a summary. It may not cover all possible information. If you have questions about this medicine, talk to your doctor, pharmacist, or health care provider.  2024 Elsevier/Gold Standard (2022-03-27 00:00:00)  Fluorouracil Injection What is this medication? FLUOROURACIL (flure oh YOOR a sil) treats some types of cancer. It works by slowing down the growth of cancer cells. This medicine may be used for other purposes; ask your health care provider or pharmacist if you have questions. COMMON BRAND NAME(S): Adrucil What should I tell my care team before I take this medication? They need to know if you have any of these conditions: Blood disorders Dihydropyrimidine dehydrogenase (DPD) deficiency Infection, such as chickenpox, cold sores, herpes Kidney disease Liver disease Poor nutrition Recent or ongoing radiation therapy An unusual or allergic reaction to fluorouracil, other medications, foods, dyes, or preservatives If you or your partner are pregnant or trying to get pregnant Breast-feeding How should I use this medication? This medication is injected into a vein. It is administered by your care team in a hospital or clinic setting. Talk to your care team  about the use of this medication in children. Special care may be needed. Overdosage: If you think you have taken too much of this medicine contact a poison control center or emergency room at once. NOTE: This medicine is only for you. Do not share this medicine with others. What if I miss a dose? Keep appointments for follow-up doses. It is important not to miss your dose. Call your care team if you are unable to keep an appointment. What may interact with this medication? Do not take this medication with any of the following: Live virus vaccines This medication may also interact with the following: Medications that treat or prevent blood clots, such as warfarin, enoxaparin, dalteparin This list may not describe all possible interactions. Give your health care provider a list of all the medicines, herbs, non-prescription drugs, or dietary supplements you use. Also tell them if you smoke, drink alcohol, or use illegal drugs. Some items may interact with your medicine. What should I watch for while using this medication? Your condition will be monitored carefully while you are receiving this medication. This medication may make you feel generally unwell. This is not uncommon as chemotherapy can affect healthy cells as well as cancer cells. Report any side effects. Continue your course of treatment even though you feel ill unless your care team tells you to stop. In some cases, you may be given additional medications to help with side effects. Follow all directions for their use. This medication may increase your risk of getting an infection. Call your care team for advice if you get a fever, chills, sore throat, or other symptoms of a cold or flu. Do not treat yourself. Try to avoid being around people who are sick. This medication may increase your risk to bruise or bleed. Call your care team if you notice any unusual bleeding. Be careful brushing or flossing your teeth or using a toothpick because  you may get an infection or bleed more easily. If you have any dental work done, tell your dentist you are receiving this medication. Avoid taking medications that contain aspirin, acetaminophen, ibuprofen, naproxen, or ketoprofen unless instructed by your care team. These medications may hide a fever. Do not treat diarrhea with over the counter products. Contact your care team if you have diarrhea that lasts more than 2 days or if it is severe and watery. This medication can make you more sensitive to the sun. Keep out of the sun. If you cannot avoid being in the sun, wear protective clothing and sunscreen. Do not use sun lamps,  tanning beds, or tanning booths. Talk to your care team if you or your partner wish to become pregnant or think you might be pregnant. This medication can cause serious birth defects if taken during pregnancy and for 3 months after the last dose. A reliable form of contraception is recommended while taking this medication and for 3 months after the last dose. Talk to your care team about effective forms of contraception. Do not father a child while taking this medication and for 3 months after the last dose. Use a condom while having sex during this time period. Do not breastfeed while taking this medication. This medication may cause infertility. Talk to your care team if you are concerned about your fertility. What side effects may I notice from receiving this medication? Side effects that you should report to your care team as soon as possible: Allergic reactions--skin rash, itching, hives, swelling of the face, lips, tongue, or throat Heart attack--pain or tightness in the chest, shoulders, arms, or jaw, nausea, shortness of breath, cold or clammy skin, feeling faint or lightheaded Heart failure--shortness of breath, swelling of the ankles, feet, or hands, sudden weight gain, unusual weakness or fatigue Heart rhythm changes--fast or irregular heartbeat, dizziness, feeling  faint or lightheaded, chest pain, trouble breathing High ammonia level--unusual weakness or fatigue, confusion, loss of appetite, nausea, vomiting, seizures Infection--fever, chills, cough, sore throat, wounds that don't heal, pain or trouble when passing urine, general feeling of discomfort or being unwell Low red blood cell level--unusual weakness or fatigue, dizziness, headache, trouble breathing Pain, tingling, or numbness in the hands or feet, muscle weakness, change in vision, confusion or trouble speaking, loss of balance or coordination, trouble walking, seizures Redness, swelling, and blistering of the skin over hands and feet Severe or prolonged diarrhea Unusual bruising or bleeding Side effects that usually do not require medical attention (report to your care team if they continue or are bothersome): Dry skin Headache Increased tears Nausea Pain, redness, or swelling with sores inside the mouth or throat Sensitivity to light Vomiting This list may not describe all possible side effects. Call your doctor for medical advice about side effects. You may report side effects to FDA at 1-800-FDA-1088. Where should I keep my medication? This medication is given in a hospital or clinic. It will not be stored at home. NOTE: This sheet is a summary. It may not cover all possible information. If you have questions about this medicine, talk to your doctor, pharmacist, or health care provider.  2024 Elsevier/Gold Standard (2022-02-27 00:00:00)

## 2023-08-21 NOTE — Telephone Encounter (Signed)
Component Ref Range & Units 08:31 (08/21/23) 7 d ago (08/14/23) 2 wk ago (08/02/23) 3 wk ago (07/31/23) 1 mo ago (07/17/23) 1 mo ago (06/26/23) 2 mo ago (06/19/23)  Potassium 3.5 - 5.1 mmol/L 4.3 3.2 Low  4.0 3.3 Low  3.7 3.4 Low  3.1 Low

## 2023-08-21 NOTE — Progress Notes (Unsigned)
[facility]  [phone] [phone]) [phone]  ID: Kristopher Glee OB: 16-Apr-1946  MR#: [id number]  AOZ#:[id number]  Patient Care Team: Barbette Reichmann, MD as PCP - General (Internal Medicine) Antonieta Iba, MD as PCP - Cardiology (Cardiology) Jeralyn Ruths, MD as Consulting Physician (Oncology) Benita Gutter, RN as Oncology Nurse Navigator Salena Saner, MD as Consulting Physician (Pulmonary Disease)  CHIEF COMPLAINT: Recurrent stage IV rectal cancer, history of follicular lymphoma.  INTERVAL HISTORY: Patient returns to clinic today for further evaluation and reconsideration of cycle 9 of FOLFOX plus Avastin.  Patient was seen by cardiology for her A-fib and is now better rate control since adding amiodarone.  She continues to have chronic fatigue, but otherwise feels well. She is tolerating her treatments without significant side effects.  She has a fair appetite and is maintaining her weight. Her pain is well-controlled with tramadol.  She has a chronic peripheral neuropathy, but no other neurologic complaints.  She denies any recent fevers or illnesses. She denies any nausea, vomiting, constipation or diarrhea.  She has no urinary complaints.  Patient offers no further specific complaints today.  REVIEW OF SYSTEMS:   Review of Systems  Constitutional:  Positive for malaise/fatigue. Negative for fever and weight loss.  Respiratory: Negative.  Negative for cough, hemoptysis and shortness of breath.   Cardiovascular: Negative.  Negative for chest pain, palpitations and leg swelling.  Gastrointestinal:  Negative for abdominal pain, blood in stool, diarrhea, melena, nausea and vomiting.  Genitourinary: Negative.  Negative for dysuria.  Musculoskeletal: Negative.  Negative for back pain, joint pain and neck pain.  Skin: Negative.  Negative for rash.  Neurological:  Positive for tingling and sensory change. Negative for focal weakness,  weakness and headaches.  Psychiatric/Behavioral: Negative.  The patient is not nervous/anxious and does not have insomnia.     As per HPI. Otherwise, a complete review of systems is negative.   PAST MEDICAL HISTORY: Past Medical History:  Diagnosis Date   Anemia due to antineoplastic chemotherapy    Aortic atherosclerosis (HCC)    Arthritis    Atrial fibrillation and flutter (HCC)    a.) 04/2023 Zio: Monitor 1 - 16 % aflutter burden w/ avg rate of 168, (78-226), possible Atach. Freq PACs, rare PVCs. Monitor 2 - 15% afib/flutter burden @ [phone]). Poss Atach. 4 beats NSVT. Freq PACs, rare PVCs; b.)  CHA2DS2-VASc = 4 (age x2, sex, vascular disease history); c.) cardiac rate/rhythm maintained on oral metoprolol succinate; chronically anticoagulated using apixaban   Bronchial obstruction    Chemotherapy-induced neuropathy (HCC)    Chicken pox    Colon polyp    Coronary artery calcification seen on CT scan    a. 11/2022 CT Chest: Coronary artery calcification and aortic atherosclerosis.   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 04/2023 Echo: EF >55%, nl RV size/fxn.  No significant valvular disease observed.   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, cN2a, cM1a)   Thrombocytopenia (HCC)     PAST SURGICAL HISTORY: Past Surgical History:  Procedure  Laterality Date   AXILLARY LYMPH NODE DISSECTION Right 08/21/2016   Procedure: AXILLARY LYMPH NODE excision;  Surgeon: Nadeen Landau, MD;  Location: [facility] ORS;  Service: General;  Laterality: Right;   CATARACT EXTRACTION, BILATERAL Bilateral    COLONOSCOPY N/A 09/19/2020   Procedure:  COLONOSCOPY;  Surgeon: Regis Bill, MD;  Location: [facility] ENDOSCOPY;  Service: Endoscopy;  Laterality: N/A;   ENDOBRONCHIAL ULTRASOUND Left 08/05/2023   Procedure: ENDOBRONCHIAL ULTRASOUND;  Surgeon: Salena Saner, MD;  Location: [facility] ORS;  Service: Pulmonary;  Laterality: Left;   PORTA CATH INSERTION N/A 09/16/2017   Procedure: PORTA CATH INSERTION;  Surgeon: Annice Needy, MD;  Location: [facility] INVASIVE CV LAB;  Service: Cardiovascular;  Laterality: N/A;   TONSILLECTOMY     TOTAL HIP ARTHROPLASTY Left 1992    FAMILY HISTORY: Family History  Problem Relation Age of Onset   Heart failure Mother    Emphysema Father    Diabetes Sister    Lung cancer Brother    Diabetes Brother    Breast cancer Paternal Aunt    Basal cell carcinoma Daughter     ADVANCED DIRECTIVES (Y/N):  N  HEALTH MAINTENANCE: Social History   Tobacco Use   Smoking status: Never   Smokeless tobacco: Never  Vaping Use   Vaping status: Never Used  Substance Use Topics   Alcohol use: No   Drug use: No     Colonoscopy:  PAP:  Bone density:  Lipid panel:  No Known Allergies  Current Outpatient Medications  Medication Sig Dispense Refill   amiodarone (PACERONE) 200 MG tablet Take 1 tablet (200 mg) by mouth twice daily for 2 weeks, then decrease to 1 tablet (200 mg) by mouth daily. 90 tablet 3   apixaban (ELIQUIS) 5 MG TABS tablet Take 1 tablet (5 mg total) by mouth 2 (two) times daily. 180 tablet 3   diclofenac sodium (VOLTAREN) 1 % GEL Apply 2 g topically 2 (two) times daily as needed.     diphenoxylate-atropine (LOMOTIL) 2.5-0.025 MG tablet TAKE 2 TABLETS BY MOUTH 4 (FOUR) TIMES DAILY AS NEEDED FOR DIARRHEA OR LOOSE STOOLS. 60 tablet 1   gabapentin (NEURONTIN) 300 MG capsule TAKE 1 CAPSULE BY MOUTH TWICE A DAY 60 capsule 2   lidocaine-prilocaine (EMLA) cream Apply 1 Application topically as needed. Apply to port 1 hour prior to use as needed 30 g 2   megestrol (MEGACE) 40 MG tablet Take 1 tablet  (40 mg total) by mouth daily. 90 tablet 1   metoprolol succinate (TOPROL XL) 25 MG 24 hr tablet Take 0.5 tablets (12.5 mg total) by mouth at bedtime. 30 tablet 2   ondansetron (ZOFRAN) 8 MG tablet Take 1 tablet (8 mg total) by mouth every 8 (eight) hours as needed for nausea or vomiting. Start on the third day after chemotherapy. 60 tablet 2   pantoprazole (PROTONIX) 20 MG tablet Take 1 tablet by mouth daily as needed for heartburn.     simvastatin (ZOCOR) 20 MG tablet Take 20 mg by mouth at bedtime.     traMADol-acetaminophen (ULTRACET) 37.5-325 MG tablet Take 1 tablet by mouth every 8 (eight) hours as needed.     beclomethasone (QVAR REDIHALER) 80 MCG/ACT inhaler Inhale 2 puffs into the lungs 2 (two) times daily. (Patient not taking: Reported on 08/21/2023) 10.6 g 4   KLOR-CON M20 20 MEQ tablet TAKE 1 TABLET BY MOUTH TWICE A DAY 180 tablet 1   predniSONE (DELTASONE) 20 MG tablet Take 1 tablet (20 mg  total) by mouth daily with breakfast for 5 days. (Patient not taking: Reported on 08/21/2023) 5 tablet 0   No current facility-administered medications for this visit.   Facility-Administered Medications Ordered in Other Visits  Medication Dose Route Frequency Provider Last Rate Last Admin   heparin lock flush 100 unit/mL  500 Units Intravenous Once Orlie Dakin, Tollie Pizza, MD       heparin lock flush 100 unit/mL  500 Units Intracatheter PRN Orlie Dakin, Tollie Pizza, MD       heparin lock flush 100 unit/mL  500 Units Intravenous Once Jeralyn Ruths, MD       heparin lock flush 100 unit/mL  500 Units Intravenous Once Jeralyn Ruths, MD       sodium chloride flush (NS) 0.9 % injection 10 mL  10 mL Intravenous PRN Jeralyn Ruths, MD   10 mL at 12/04/17 0901   sodium chloride flush (NS) 0.9 % injection 10 mL  10 mL Intracatheter PRN Jeralyn Ruths, MD       sodium chloride flush (NS) 0.9 % injection 10 mL  10 mL Intravenous PRN Jeralyn Ruths, MD   10 mL at 04/24/21 0839   sodium  chloride flush (NS) 0.9 % injection 10 mL  10 mL Intracatheter PRN Jeralyn Ruths, MD   10 mL at 04/12/23 1419   yttrium-90 injection 22.3 millicurie  22.3 millicurie Intravenous Once Chrystal, Sherrine Maples, MD        OBJECTIVE: Vitals:   08/21/23 0910  BP: 104/65  Pulse: 100  Resp: 20  Temp: (!) 97.2 F (36.2 C)  SpO2: 97%       Body mass index is 16.93 kg/m.    ECOG FS:0 - Asymptomatic  General: Well-developed, well-nourished, no acute distress. Eyes: Pink conjunctiva, anicteric sclera. HEENT: Normocephalic, moist mucous membranes. Lungs: No audible wheezing or coughing. Heart: Regular rate and rhythm. Abdomen: Soft, nontender, no obvious distention. Musculoskeletal: No edema, cyanosis, or clubbing. Neuro: Alert, answering all questions appropriately. Cranial nerves grossly intact. Skin: No rashes or petechiae noted. Psych: Normal affect.  LAB RESULTS:  Lab Results  Component Value Date   NA 136 08/21/2023   K 4.3 08/21/2023   CL 101 08/21/2023   CO2 26 08/21/2023   GLUCOSE 122 (H) 08/21/2023   BUN 11 08/21/2023   CREATININE 0.73 08/21/2023   CALCIUM 9.2 08/21/2023   PROT 7.0 08/21/2023   ALBUMIN 3.7 08/21/2023   AST 19 08/21/2023   ALT 13 08/21/2023   ALKPHOS 69 08/21/2023   BILITOT 0.6 08/21/2023   GFRNONAA >60 08/21/2023   GFRAA >60 06/28/2020    Lab Results  Component Value Date   WBC 7.8 08/21/2023   NEUTROABS 4.4 08/21/2023   HGB 12.6 08/21/2023   HCT 38.6 08/21/2023   MCV 100.5 (H) 08/21/2023   PLT 194 08/21/2023     STUDIES: CT SUPER D CHEST WO MONARCH PILOT  Result Date: 08/05/2023 CLINICAL DATA:  Hypermetabolic lung nodules in this patient with history of rectal cancer. Enlarging left lower lobe lung nodule. * Tracking Code: BO * EXAM: CT CHEST WITHOUT CONTRAST TECHNIQUE: Multidetector CT imaging of the chest was performed using thin slice collimation for electromagnetic bronchoscopy planning purposes, without intravenous contrast. RADIATION  DOSE REDUCTION: This exam was performed according to the departmental dose-optimization program which includes automated exposure control, adjustment of the mA and/or kV according to patient size and/or use of iterative reconstruction technique. COMPARISON:  07/10/2023 FINDINGS: Cardiovascular: Right Port-A-Cath tip: SVC. Coronary, aortic arch,  and branch vessel atherosclerotic vascular disease. Mediastinum/Nodes: Fullness of the right hilum and right infrahilar region potentially with adenopathy in this vicinity better shown on the postcontrast imaging of 07/10/2023. Lungs/Pleura: Persistent volume loss and nodularity in the right lower lobe and along the azygoesophageal recess, no change from 07/10/2023. Increased atelectasis medially in the right middle lobe. Anterior right upper lobe nodule 6 by 5 mm on image 62 series 4, previously 5 by 5 mm. Peripheral subpleural consolidation in the right upper lobe, nonspecific. Left upper lobe nodule 5 mm in long axis on image 22 series 4, previously 4 mm. Other small nodules minimally enlarged in the left upper lobe. The dominant left lower lobe nodule measures 1.9 by 1.7 cm on image 95 series 4, previously same on 07/10/2023. Upper Abdomen: Abdominal aortic atherosclerosis. Musculoskeletal: Pectus excavatum, Haller index 3.2. IMPRESSION: 1. Previous smaller nodules are mildly enlarged from 07/10/2023, the larger nodules appear stable. 2. Persistent volume loss and nodularity in the right lower lobe and along the azygoesophageal recess, no change from 07/10/2023. 3. Fullness of the right hilum and right infrahilar region potentially with adenopathy in this vicinity better shown on the postcontrast imaging of 07/10/2023. 4. Pectus excavatum, Haller index 3.2. 5. Aortic atherosclerosis. Aortic Atherosclerosis (ICD10-I70.0). Electronically Signed   By: Gaylyn Rong M.D.   On: 08/05/2023 17:22   DG Chest Port 1 View  Result Date: 08/05/2023 CLINICAL DATA:  Enlarging  lung nodule, history of rectal cancer EXAM: PORTABLE CHEST 1 VIEW COMPARISON:  07/10/2023 FINDINGS: Right Port-A-Cath tip: SVC. Atherosclerotic calcification of the aortic arch. Right hilar and infrahilar nodule noted. Retrocardiac nodule poorly seen on this radiograph. No pneumothorax or blunting of the costophrenic angles. The patient is rotated to the right on today's radiograph, reducing diagnostic sensitivity and specificity. IMPRESSION: 1. Right hilar and infrahilar nodule. Retrocardiac nodule poorly seen on today's radiograph. 2. Right Port-A-Cath tip: SVC. 3. No pneumothorax or early postprocedural complicating feature identified. Electronically Signed   By: Gaylyn Rong M.D.   On: 08/05/2023 17:15   DG C-Arm 1-60 Min-No Report  Result Date: 08/05/2023 Fluoroscopy was utilized by the requesting physician.  No radiographic interpretation.    ONCOLOGY HISTORY:  Biopsy confirmed rectal cancer.  MRI completed at [facility] on October 11, 2020 revealed extension of the tumor beyond the wall into the rectovaginal recess with suspected invasion of the vaginal wall posteriorly.  Tumor also appears to involve the internal anal sphincter.  There are also numerous prominent presacral and mesorectal lymph nodes highly suspicious for malignancy.  PET scan results from November 17, 2020 reviewed independently with 3 hypermetabolic right lower lobe lung nodules consistent with pulmonary metastasis.  Patient completed cycle 8 of FOLFOX on Mar 29, 2021.  She then completed cycle 5 of her 5-FU pump on May 26, 2021 and XRT on June 01, 2021.  PET scan results from July 17, 2021 reviewed independently with no evidence of disease other than enlarging right lower lobe lung lesion.  Patient completed SBRT to the right lung lesion on August 22, 2021.  Repeat PET scan on July 25, 2021 revealed minimal residual disease in the rectum and likely resolution of lesions in the lung.    ASSESSMENT: Recurrent  stage IV rectal cancer, history of follicular lymphoma.  PLAN:    Recurrent stage IV rectal cancer: See oncology history as above.  PET scan results from Mar 21, 2023 reviewed independently with clear progression of disease in patient's pelvis as well as multiple pulmonary nodules.  Her CEA initially increased to 29.8, but now has trended down to 8.0.  Hospice and end-of-life care were previously discussed, but patient preferred to pursue treatment.  CT scan results July 13, 2023 reviewed independently with overall improvement of disease, although patient has a left lower lobe lesion that has increased in size.  Proceed with cycle 9 of treatment today.  Return to clinic in 2 days for pump removal and Udenyca and then in 2 weeks for further evaluation and consideration of cycle 10.   Left lower lobe lung lesion: Increasing in size despite improvement of disease elsewhere.  Recent bronchoscopy did not reveal any evidence of malignancy.  Continue to monitor closely. Recurrent follicular lymphoma: CT scan results from December 23, 2019 reviewed independently with no obvious evidence of recurrent or progressive disease.  PET scan results as above consistent with rectal cancer metastasis and no evidence of lymphoma.   Patient received Zevalin on July 30, 2017 with not much therapeutic effect.  She subsequently underwent cycles 5 of R-CHOP chemotherapy with Neulasta support completing on December 12, 2017.  Patient continues to be in complete remission.  Neuropathy: Chronic and unchanged.   Pelvic pain: Patient does not complain of this today.  Well-controlled with tramadol. Poor appetite/weight loss: Resolved.  Continue Megace as prescribed. Neutropenia: Resolved.  Continue Udenyca with pump removal. Thrombocytopenia: Resolved. Hypokalemia: Resolved.  Continue oral potassium supplementation. Coping/adjustment: Patient was previously given a referral to [facility]. Atrial fibrillation: Appreciate  cardiology input.  Patient now on amiodarone.  Patient expressed understanding and was in agreement with this plan. She also understands that She can call clinic at any time with any questions, concerns, or complaints.    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

## 2023-08-22 ENCOUNTER — Encounter: Payer: Self-pay | Admitting: Oncology

## 2023-08-22 ENCOUNTER — Other Ambulatory Visit: Payer: Self-pay | Admitting: Oncology

## 2023-08-22 LAB — CEA: CEA: 7.2 ng/mL — ABNORMAL HIGH (ref 0.0–4.7)

## 2023-08-23 ENCOUNTER — Inpatient Hospital Stay: Payer: 59

## 2023-08-23 VITALS — BP 103/61 | HR 104 | Temp 96.7°F | Resp 20

## 2023-08-23 DIAGNOSIS — C20 Malignant neoplasm of rectum: Secondary | ICD-10-CM

## 2023-08-23 DIAGNOSIS — Z5112 Encounter for antineoplastic immunotherapy: Secondary | ICD-10-CM | POA: Diagnosis not present

## 2023-08-23 MED ORDER — SODIUM CHLORIDE 0.9 % IV SOLN
Freq: Once | INTRAVENOUS | Status: AC
  Filled 2023-08-23: qty 250

## 2023-08-23 MED ORDER — HEPARIN SOD (PORK) LOCK FLUSH 100 UNIT/ML IV SOLN
500.0000 [IU] | Freq: Once | INTRAVENOUS | Status: AC
  Administered 2023-08-23: 500 [IU] via INTRAVENOUS
  Filled 2023-08-23: qty 5

## 2023-08-23 MED ORDER — PEGFILGRASTIM-CBQV 6 MG/0.6ML ~~LOC~~ SOSY
6.0000 mg | PREFILLED_SYRINGE | Freq: Once | SUBCUTANEOUS | Status: AC
  Administered 2023-08-23: 6 mg via SUBCUTANEOUS
  Filled 2023-08-23: qty 0.6

## 2023-08-23 MED ORDER — SODIUM CHLORIDE 0.9% FLUSH
10.0000 mL | Freq: Once | INTRAVENOUS | Status: AC
  Administered 2023-08-23: 10 mL via INTRAVENOUS
  Filled 2023-08-23: qty 10

## 2023-08-24 ENCOUNTER — Other Ambulatory Visit: Payer: Self-pay | Admitting: Oncology

## 2023-08-26 ENCOUNTER — Encounter: Payer: Self-pay | Admitting: Oncology

## 2023-08-28 ENCOUNTER — Ambulatory Visit: Payer: 59 | Admitting: Oncology

## 2023-08-28 ENCOUNTER — Ambulatory Visit: Payer: 59

## 2023-08-28 ENCOUNTER — Other Ambulatory Visit: Payer: 59

## 2023-08-30 ENCOUNTER — Ambulatory Visit: Payer: 59

## 2023-08-30 NOTE — Progress Notes (Signed)
Udenyca Chief Executive Officer; sub'd Neulasta (pref by insur)   Ebony Hail, Pharm.D., CPP 08/30/2023@1 :31 PM

## 2023-09-04 ENCOUNTER — Inpatient Hospital Stay (HOSPITAL_BASED_OUTPATIENT_CLINIC_OR_DEPARTMENT_OTHER): Payer: 59 | Admitting: Oncology

## 2023-09-04 ENCOUNTER — Inpatient Hospital Stay: Payer: 59

## 2023-09-04 ENCOUNTER — Encounter: Payer: Self-pay | Admitting: Oncology

## 2023-09-04 DIAGNOSIS — C20 Malignant neoplasm of rectum: Secondary | ICD-10-CM | POA: Diagnosis not present

## 2023-09-04 DIAGNOSIS — Z5112 Encounter for antineoplastic immunotherapy: Secondary | ICD-10-CM | POA: Diagnosis not present

## 2023-09-04 LAB — CMP (CANCER CENTER ONLY)
ALT: 14 U/L (ref 0–44)
AST: 23 U/L (ref 15–41)
Albumin: 3.3 g/dL — ABNORMAL LOW (ref 3.5–5.0)
Alkaline Phosphatase: 69 U/L (ref 38–126)
Anion gap: 8 (ref 5–15)
BUN: 10 mg/dL (ref 8–23)
CO2: 24 mmol/L (ref 22–32)
Calcium: 8.7 mg/dL — ABNORMAL LOW (ref 8.9–10.3)
Chloride: 102 mmol/L (ref 98–111)
Creatinine: 0.71 mg/dL (ref 0.44–1.00)
GFR, Estimated: >60 mL/min (ref >60)
Glucose, Bld: 96 mg/dL (ref 70–99)
Potassium: 4.4 mmol/L (ref 3.5–5.1)
Sodium: 134 mmol/L — ABNORMAL LOW (ref 135–145)
Total Bilirubin: 0.4 mg/dL (ref 0.3–1.2)
Total Protein: 6.5 g/dL (ref 6.5–8.1)

## 2023-09-04 LAB — CBC WITH DIFFERENTIAL (CANCER CENTER ONLY)
Abs Immature Granulocytes: 0.06 10*3/uL (ref 0.00–0.07)
Basophils Absolute: 0 10*3/uL (ref 0.0–0.1)
Basophils Relative: 1 %
Eosinophils Absolute: 0.1 10*3/uL (ref 0.0–0.5)
Eosinophils Relative: 1 %
HCT: 36.9 % (ref 36.0–46.0)
Hemoglobin: 11.9 g/dL — ABNORMAL LOW (ref 12.0–15.0)
Immature Granulocytes: 1 %
Lymphocytes Relative: 26 %
Lymphs Abs: 2.2 10*3/uL (ref 0.7–4.0)
MCH: 32.7 pg (ref 26.0–34.0)
MCHC: 32.2 g/dL (ref 30.0–36.0)
MCV: 101.4 fL — ABNORMAL HIGH (ref 80.0–100.0)
Monocytes Absolute: 1.3 10*3/uL — ABNORMAL HIGH (ref 0.1–1.0)
Monocytes Relative: 15 %
Neutro Abs: 4.8 10*3/uL (ref 1.7–7.7)
Neutrophils Relative %: 56 %
Platelet Count: 129 10*3/uL — ABNORMAL LOW (ref 150–400)
RBC: 3.64 MIL/uL — ABNORMAL LOW (ref 3.87–5.11)
RDW: 15 % (ref 11.5–15.5)
WBC Count: 8.4 10*3/uL (ref 4.0–10.5)
nRBC: 0 % (ref 0.0–0.2)

## 2023-09-04 MED ORDER — SODIUM CHLORIDE 0.9 % IV SOLN
Freq: Once | INTRAVENOUS | Status: DC
Start: 2023-09-04 — End: 2023-09-04
  Filled 2023-09-04: qty 250

## 2023-09-04 MED ORDER — DEXTROSE 5 % IV SOLN
85.0000 mg/m2 | Freq: Once | INTRAVENOUS | Status: AC
  Administered 2023-09-04: 100 mg via INTRAVENOUS
  Filled 2023-09-04: qty 20

## 2023-09-04 MED ORDER — FAMOTIDINE IN NACL 20-0.9 MG/50ML-% IV SOLN
20.0000 mg | Freq: Once | INTRAVENOUS | Status: AC
Start: 2023-09-04 — End: 2023-09-04
  Administered 2023-09-04: 20 mg via INTRAVENOUS
  Filled 2023-09-04: qty 50

## 2023-09-04 MED ORDER — FLUOROURACIL CHEMO INJECTION 500 MG/10ML
400.0000 mg/m2 | Freq: Once | INTRAVENOUS | Status: AC
  Administered 2023-09-04: 500 mg via INTRAVENOUS
  Filled 2023-09-04: qty 10

## 2023-09-04 MED ORDER — SODIUM CHLORIDE 0.9 % IV SOLN
5.0000 mg/kg | Freq: Once | INTRAVENOUS | Status: AC
  Administered 2023-09-04: 200 mg via INTRAVENOUS
  Filled 2023-09-04: qty 8

## 2023-09-04 MED ORDER — LEUCOVORIN CALCIUM INJECTION 350 MG
400.0000 mg/m2 | Freq: Once | INTRAVENOUS | Status: AC
  Administered 2023-09-04: 496 mg via INTRAVENOUS
  Filled 2023-09-04: qty 24.8

## 2023-09-04 MED ORDER — DIPHENHYDRAMINE HCL 50 MG/ML IJ SOLN
25.0000 mg | Freq: Once | INTRAMUSCULAR | Status: AC
  Administered 2023-09-04: 25 mg via INTRAVENOUS
  Filled 2023-09-04: qty 1

## 2023-09-04 MED ORDER — DEXTROSE 5 % IV SOLN
Freq: Once | INTRAVENOUS | Status: DC
Start: 2023-09-04 — End: 2023-09-04
  Filled 2023-09-04: qty 250

## 2023-09-04 MED ORDER — PALONOSETRON HCL INJECTION 0.25 MG/5ML
0.2500 mg | Freq: Once | INTRAVENOUS | Status: AC
  Administered 2023-09-04: 0.25 mg via INTRAVENOUS
  Filled 2023-09-04: qty 5

## 2023-09-04 MED ORDER — SODIUM CHLORIDE 0.9 % IV SOLN
2400.0000 mg/m2 | INTRAVENOUS | Status: DC
  Administered 2023-09-04: 3000 mg via INTRAVENOUS
  Filled 2023-09-04: qty 60

## 2023-09-04 MED ORDER — DEXAMETHASONE SODIUM PHOSPHATE 10 MG/ML IJ SOLN
10.0000 mg | Freq: Once | INTRAMUSCULAR | Status: AC
  Administered 2023-09-04: 10 mg via INTRAVENOUS
  Filled 2023-09-04: qty 1

## 2023-09-04 NOTE — Patient Instructions (Signed)
[facility]  Discharge Instructions: Thank you for choosing [facility] to provide your oncology and hematology care.  If you have a lab appointment with the Cancer Center, please go directly to the Cancer Center and check in at the registration area.  Wear comfortable clothing and clothing appropriate for easy access to any Portacath or PICC line.   We strive to give you quality time with your provider. You may need to reschedule your appointment if you arrive late (15 or more minutes).  Arriving late affects you and other patients whose appointments are after yours.  Also, if you miss three or more appointments without notifying the office, you may be dismissed from the clinic at the provider's discretion.      For prescription refill requests, have your pharmacy contact our office and allow 72 hours for refills to be completed.    Today you received the following chemotherapy and/or immunotherapy agents Oxaliplatin, Leucovorin, Adrucil, Mvasi      To help prevent nausea and vomiting after your treatment, we encourage you to take your nausea medication as directed.  BELOW ARE SYMPTOMS THAT SHOULD BE REPORTED IMMEDIATELY: *FEVER GREATER THAN 100.4 F (38 C) OR HIGHER *CHILLS OR SWEATING *NAUSEA AND VOMITING THAT IS NOT CONTROLLED WITH YOUR NAUSEA MEDICATION *UNUSUAL SHORTNESS OF BREATH *UNUSUAL BRUISING OR BLEEDING *URINARY PROBLEMS (pain or burning when urinating, or frequent urination) *BOWEL PROBLEMS (unusual diarrhea, constipation, pain near the anus) TENDERNESS IN MOUTH AND THROAT WITH OR WITHOUT PRESENCE OF ULCERS (sore throat, sores in mouth, or a toothache) UNUSUAL RASH, SWELLING OR PAIN  UNUSUAL VAGINAL DISCHARGE OR ITCHING   Items with * indicate a potential emergency and should be followed up as soon as possible or go to the Emergency Department if any problems should occur.  Please show the CHEMOTHERAPY ALERT CARD or  IMMUNOTHERAPY ALERT CARD at check-in to the Emergency Department and triage nurse.  Should you have questions after your visit or need to cancel or reschedule your appointment, please contact [place] [facility]  [phone] and follow the prompts.  Office hours are 8:00 a.m. to 4:30 p.m. Monday - Friday. Please note that voicemails left after 4:00 p.m. may not be returned until the following business day.  We are closed weekends and major holidays. You have access to a nurse at all times for urgent questions. Please call the main number to the clinic [phone] and follow the prompts.  For any non-urgent questions, you may also contact your provider using MyChart. We now offer e-Visits for anyone 4 and older to request care online for non-urgent symptoms. For details visit [email or web address].   Also download the MyChart app! Go to the app store, search "MyChart", open the app, select [place], and log in with your MyChart username and password.

## 2023-09-04 NOTE — Progress Notes (Signed)
[facility]  [phone] [phone]  ID: Veronica Boyd OB: Dec 26, 1945  MR#: [id number]  UUV#:[id number]  Patient Care Team: Barbette Reichmann, MD as PCP - General (Internal Medicine) Antonieta Iba, MD as PCP - Cardiology (Cardiology) Jeralyn Ruths, MD as Consulting Physician (Oncology) Benita Gutter, RN as Oncology Nurse Navigator Salena Saner, MD as Consulting Physician (Pulmonary Disease)  CHIEF COMPLAINT: Recurrent stage IV rectal cancer, history of follicular lymphoma.  INTERVAL HISTORY: Patient returns to clinic today for further evaluation and consideration of cycle 10 of FOLFOX plus Avastin.  She currently feels well and is asymptomatic.  She no longer is symptomatic from her atrial fibrillation.  She has chronic fatigue.  She is to tolerate her treatments well without significant side effects.  She has a fair appetite and is maintaining her weight. Her pain is well-controlled with tramadol.  She has a chronic peripheral neuropathy, but no other neurologic complaints.  She denies any recent fevers or illnesses. She denies any nausea, vomiting, constipation or diarrhea.  She has no urinary complaints.  Patient offers no further specific complaints today.  REVIEW OF SYSTEMS:   Review of Systems  Constitutional:  Positive for malaise/fatigue. Negative for fever and weight loss.  Respiratory: Negative.  Negative for cough, hemoptysis and shortness of breath.   Cardiovascular: Negative.  Negative for chest pain, palpitations and leg swelling.  Gastrointestinal:  Negative for abdominal pain, blood in stool, diarrhea, melena, nausea and vomiting.  Genitourinary: Negative.  Negative for dysuria.  Musculoskeletal: Negative.  Negative for back pain, joint pain and neck pain.  Skin: Negative.  Negative for rash.  Neurological:  Positive for tingling and sensory change. Negative for focal weakness, weakness and headaches.   Psychiatric/Behavioral: Negative.  The patient is not nervous/anxious and does not have insomnia.     As per HPI. Otherwise, a complete review of systems is negative.   PAST MEDICAL HISTORY: Past Medical History:  Diagnosis Date   Anemia due to antineoplastic chemotherapy    Aortic atherosclerosis (HCC)    Arthritis    Atrial fibrillation and flutter (HCC)    a.) 04/2023 Zio: Monitor 1 - 16 % aflutter burden w/ avg rate of 168, (78-226), possible Atach. Freq PACs, rare PVCs. Monitor 2 - 15% afib/flutter burden @ 115 [phone]). Poss Atach. 4 beats NSVT. Freq PACs, rare PVCs; b.)  CHA2DS2-VASc = 4 (age x2, sex, vascular disease history); c.) cardiac rate/rhythm maintained on oral metoprolol succinate; chronically anticoagulated using apixaban   Bronchial obstruction    Chemotherapy-induced neuropathy (HCC)    Chicken pox    Colon polyp    Coronary artery calcification seen on CT scan    a. 11/2022 CT Chest: Coronary artery calcification and aortic atherosclerosis.   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 04/2023 Echo: EF >55%, nl RV size/fxn.  No significant valvular disease observed.   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, cN2a, cM1a)   Thrombocytopenia (HCC)     PAST SURGICAL HISTORY: Past Surgical History:  Procedure Laterality Date  AXILLARY LYMPH NODE DISSECTION Right 08/21/2016   Procedure: AXILLARY LYMPH NODE excision;  Surgeon: Nadeen Landau, MD;  Location: [facility] ORS;  Service: General;  Laterality: Right;   CATARACT EXTRACTION, BILATERAL Bilateral    COLONOSCOPY N/A 09/19/2020   Procedure: COLONOSCOPY;  Surgeon:  Regis Bill, MD;  Location: [facility] ENDOSCOPY;  Service: Endoscopy;  Laterality: N/A;   ENDOBRONCHIAL ULTRASOUND Left 08/05/2023   Procedure: ENDOBRONCHIAL ULTRASOUND;  Surgeon: Salena Saner, MD;  Location: [facility] ORS;  Service: Pulmonary;  Laterality: Left;   PORTA CATH INSERTION N/A 09/16/2017   Procedure: PORTA CATH INSERTION;  Surgeon: Annice Needy, MD;  Location: [facility] INVASIVE CV LAB;  Service: Cardiovascular;  Laterality: N/A;   TONSILLECTOMY     TOTAL HIP ARTHROPLASTY Left 1992    FAMILY HISTORY: Family History  Problem Relation Age of Onset   Heart failure Mother    Emphysema Father    Diabetes Sister    Lung cancer Brother    Diabetes Brother    Breast cancer Paternal Aunt    Basal cell carcinoma Daughter     ADVANCED DIRECTIVES (Y/N):  N  HEALTH MAINTENANCE: Social History   Tobacco Use   Smoking status: Never   Smokeless tobacco: Never  Vaping Use   Vaping status: Never Used  Substance Use Topics   Alcohol use: No   Drug use: No     Colonoscopy:  PAP:  Bone density:  Lipid panel:  No Known Allergies  Current Outpatient Medications  Medication Sig Dispense Refill   amiodarone (PACERONE) 200 MG tablet Take 1 tablet (200 mg) by mouth twice daily for 2 weeks, then decrease to 1 tablet (200 mg) by mouth daily. 90 tablet 3   apixaban (ELIQUIS) 5 MG TABS tablet Take 1 tablet (5 mg total) by mouth 2 (two) times daily. 180 tablet 3   diclofenac sodium (VOLTAREN) 1 % GEL Apply 2 g topically 2 (two) times daily as needed.     diphenoxylate-atropine (LOMOTIL) 2.5-0.025 MG tablet TAKE 2 TABLETS BY MOUTH 4 (FOUR) TIMES DAILY AS NEEDED FOR DIARRHEA OR LOOSE STOOLS. 60 tablet 1   gabapentin (NEURONTIN) 300 MG capsule TAKE 1 CAPSULE BY MOUTH TWICE A DAY 60 capsule 2   KLOR-CON M20 20 MEQ tablet TAKE 1 TABLET BY MOUTH TWICE A DAY 180 tablet 1   lidocaine-prilocaine (EMLA) cream Apply 1 Application topically as needed. Apply to port 1 hour prior to use as needed 30  g 2   megestrol (MEGACE) 40 MG tablet Take 1 tablet (40 mg total) by mouth daily. 90 tablet 1   metoprolol succinate (TOPROL XL) 25 MG 24 hr tablet Take 0.5 tablets (12.5 mg total) by mouth at bedtime. 30 tablet 2   ondansetron (ZOFRAN) 8 MG tablet Take 1 tablet (8 mg total) by mouth every 8 (eight) hours as needed for nausea or vomiting. Start on the third day after chemotherapy. 60 tablet 2   pantoprazole (PROTONIX) 20 MG tablet Take 1 tablet by mouth daily as needed for heartburn.     simvastatin (ZOCOR) 20 MG tablet Take 20 mg by mouth at bedtime.     traMADol-acetaminophen (ULTRACET) 37.5-325 MG tablet Take 1 tablet by mouth every 8 (eight) hours as needed.     beclomethasone (QVAR REDIHALER) 80 MCG/ACT inhaler Inhale 2 puffs into the lungs 2 (two) times daily. (Patient not taking: Reported on 08/21/2023) 10.6 g 4   No current facility-administered medications for this visit.   Facility-Administered Medications Ordered in Other  Visits  Medication Dose Route Frequency Provider Last Rate Last Admin   bevacizumab-awwb (MVASI) 200 mg in sodium chloride 0.9 % 100 mL chemo infusion  5 mg/kg (Treatment Plan Recorded) Intravenous Once Jeralyn Ruths, MD       dexamethasone (DECADRON) injection 10 mg  10 mg Intravenous Once Jeralyn Ruths, MD       diphenhydrAMINE (BENADRYL) injection 25 mg  25 mg Intravenous Once Jeralyn Ruths, MD       famotidine (PEPCID) IVPB 20 mg premix  20 mg Intravenous Once Jeralyn Ruths, MD       fluorouracil (ADRUCIL) 3,000 mg in sodium chloride 0.9 % 90 mL chemo infusion  2,400 mg/m2 (Treatment Plan Recorded) Intravenous 1 day or 1 dose Orlie Dakin, Tollie Pizza, MD       fluorouracil (ADRUCIL) chemo injection 500 mg  400 mg/m2 (Treatment Plan Recorded) Intravenous Once Jeralyn Ruths, MD       heparin lock flush 100 unit/mL  500 Units Intravenous Once Jeralyn Ruths, MD       heparin lock flush 100 unit/mL  500 Units Intracatheter PRN Jeralyn Ruths, MD       heparin lock flush 100 unit/mL  500 Units Intravenous Once Jeralyn Ruths, MD       heparin lock flush 100 unit/mL  500 Units Intravenous Once Jeralyn Ruths, MD       leucovorin 496 mg in dextrose 5 % 250 mL infusion  400 mg/m2 (Treatment Plan Recorded) Intravenous Once Jeralyn Ruths, MD       oxaliplatin (ELOXATIN) 100 mg in dextrose 5 % 500 mL chemo infusion  85 mg/m2 (Treatment Plan Recorded) Intravenous Once Jeralyn Ruths, MD       palonosetron (ALOXI) injection 0.25 mg  0.25 mg Intravenous Once Jeralyn Ruths, MD       sodium chloride flush (NS) 0.9 % injection 10 mL  10 mL Intravenous PRN Jeralyn Ruths, MD   10 mL at 12/04/17 0901   sodium chloride flush (NS) 0.9 % injection 10 mL  10 mL Intracatheter PRN Jeralyn Ruths, MD       sodium chloride flush (NS) 0.9 % injection 10 mL  10 mL Intravenous PRN Jeralyn Ruths, MD   10 mL at 04/24/21 0839   sodium chloride flush (NS) 0.9 % injection 10 mL  10 mL Intracatheter PRN Jeralyn Ruths, MD   10 mL at 04/12/23 1419   yttrium-90 injection 22.3 millicurie  22.3 millicurie Intravenous Once Chrystal, Sherrine Maples, MD        OBJECTIVE: Vitals:   09/04/23 0832  BP: 106/65  Pulse: 84  Resp: 16  Temp: 98.4 F (36.9 C)  SpO2: 98%       Body mass index is 17.97 kg/m.    ECOG FS:0 - Asymptomatic  General: Well-developed, well-nourished, no acute distress. Eyes: Pink conjunctiva, anicteric sclera. HEENT: Normocephalic, moist mucous membranes. Lungs: No audible wheezing or coughing. Heart: Regular rate and rhythm. Abdomen: Soft, nontender, no obvious distention. Musculoskeletal: No edema, cyanosis, or clubbing. Neuro: Alert, answering all questions appropriately. Cranial nerves grossly intact. Skin: No rashes or petechiae noted. Psych: Normal affect.  LAB RESULTS:  Lab Results  Component Value Date   NA 134 (L) 09/04/2023   K 4.4 09/04/2023   CL 102 09/04/2023   CO2 24  09/04/2023   GLUCOSE 96 09/04/2023   BUN 10 09/04/2023   CREATININE 0.71 09/04/2023   CALCIUM 8.7 (  L) 09/04/2023   PROT 6.5 09/04/2023   ALBUMIN 3.3 (L) 09/04/2023   AST 23 09/04/2023   ALT 14 09/04/2023   ALKPHOS 69 09/04/2023   BILITOT 0.4 09/04/2023   GFRNONAA >60 09/04/2023   GFRAA >60 06/28/2020    Lab Results  Component Value Date   WBC 8.4 09/04/2023   NEUTROABS 4.8 09/04/2023   HGB 11.9 (L) 09/04/2023   HCT 36.9 09/04/2023   MCV 101.4 (H) 09/04/2023   PLT 129 (L) 09/04/2023     STUDIES: DG Chest Port 1 View  Result Date: 08/05/2023 CLINICAL DATA:  Enlarging lung nodule, history of rectal cancer EXAM: PORTABLE CHEST 1 VIEW COMPARISON:  07/10/2023 FINDINGS: Right Port-A-Cath tip: SVC. Atherosclerotic calcification of the aortic arch. Right hilar and infrahilar nodule noted. Retrocardiac nodule poorly seen on this radiograph. No pneumothorax or blunting of the costophrenic angles. The patient is rotated to the right on today's radiograph, reducing diagnostic sensitivity and specificity. IMPRESSION: 1. Right hilar and infrahilar nodule. Retrocardiac nodule poorly seen on today's radiograph. 2. Right Port-A-Cath tip: SVC. 3. No pneumothorax or early postprocedural complicating feature identified. Electronically Signed   By: Gaylyn Rong M.D.   On: 08/05/2023 17:15   DG C-Arm 1-60 Min-No Report  Result Date: 08/05/2023 Fluoroscopy was utilized by the requesting physician.  No radiographic interpretation.    ONCOLOGY HISTORY:  Biopsy confirmed rectal cancer.  MRI completed at [facility] on October 11, 2020 revealed extension of the tumor beyond the wall into the rectovaginal recess with suspected invasion of the vaginal wall posteriorly.  Tumor also appears to involve the internal anal sphincter.  There are also numerous prominent presacral and mesorectal lymph nodes highly suspicious for malignancy.  PET scan results from November 17, 2020 reviewed independently with 3  hypermetabolic right lower lobe lung nodules consistent with pulmonary metastasis.  Patient completed cycle 8 of FOLFOX on Mar 29, 2021.  She then completed cycle 5 of her 5-FU pump on May 26, 2021 and XRT on June 01, 2021.  PET scan results from July 17, 2021 reviewed independently with no evidence of disease other than enlarging right lower lobe lung lesion.  Patient completed SBRT to the right lung lesion on August 22, 2021.  Repeat PET scan on July 25, 2021 revealed minimal residual disease in the rectum and likely resolution of lesions in the lung.    ASSESSMENT: Recurrent stage IV rectal cancer, history of follicular lymphoma.  PLAN:    Recurrent stage IV rectal cancer: See oncology history as above.  PET scan results from Mar 21, 2023 reviewed independently with clear progression of disease in patient's pelvis as well as multiple pulmonary nodules.  Her CEA initially increased to 29.8, but now has trended down to 7.2.  Hospice and end-of-life care were previously discussed, but patient preferred to pursue treatment.  CT scan results July 13, 2023 reviewed independently with overall improvement of disease, although patient has a left lower lobe lesion that has increased in size.  Proceed with cycle 10 of treatment today.  Return to clinic in 2 days for pump removal and Udenyca.  Patient will then return to clinic in 2 weeks for further evaluation and consideration of cycle 11.  Will reimage at the conclusion of cycle 12.   Left lower lobe lung lesion: Increasing in size despite improvement of disease elsewhere.  Recent bronchoscopy did not reveal any evidence of malignancy.  Continue to monitor closely. Recurrent follicular lymphoma: CT scan results from December 23, 2019  reviewed independently with no obvious evidence of recurrent or progressive disease.  PET scan results as above consistent with rectal cancer metastasis and no evidence of lymphoma.   Patient received Zevalin on  July 30, 2017 with not much therapeutic effect.  She subsequently underwent cycles 5 of R-CHOP chemotherapy with Neulasta support completing on December 12, 2017.  Patient continues to be in complete remission.  Neuropathy: Chronic and unchanged. Pelvic pain: Patient does not complain of this today.  Well-controlled with tramadol. Poor appetite/weight loss: Resolved.  Continue Megace as prescribed. Neutropenia: Resolved.  Continue Udenyca with pump removal. Thrombocytopenia: Mild, monitor. Hypokalemia: Resolved.  Continue oral potassium supplementation. Coping/adjustment: Patient was previously given a referral to [facility]. Atrial fibrillation: Appreciate cardiology input.  Patient now on amiodarone.  I spent a total of 30 minutes reviewing chart data, face-to-face evaluation with the patient, counseling and coordination of care as detailed above.   Patient expressed understanding and was in agreement with this plan. She also understands that She can call clinic at any time with any questions, concerns, or complaints.    Cancer Staging  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 [facility]) Staging form: Lymphoid Neoplasms, AJCC 6th Edition - Clinical stage from 08/27/2016: Stage IIE - Signed by Jeralyn Ruths, MD on 08/27/2016  Rectal cancer [facility]) Staging form: Colon and Rectum, AJCC 8th Edition - Clinical: Stage IVA Dewaine Oats, [place]) - Signed by Jeralyn Ruths, MD on 11/02/2020   Jeralyn Ruths, MD   09/04/2023 9:37 AM

## 2023-09-05 LAB — CEA: CEA: 13 ng/mL — ABNORMAL HIGH (ref 0.0–4.7)

## 2023-09-06 ENCOUNTER — Inpatient Hospital Stay: Payer: 59 | Attending: Oncology

## 2023-09-06 VITALS — BP 122/49 | HR 92 | Temp 97.9°F | Resp 16

## 2023-09-06 DIAGNOSIS — C20 Malignant neoplasm of rectum: Secondary | ICD-10-CM

## 2023-09-06 DIAGNOSIS — Z9181 History of falling: Secondary | ICD-10-CM | POA: Diagnosis not present

## 2023-09-06 DIAGNOSIS — C7801 Secondary malignant neoplasm of right lung: Secondary | ICD-10-CM | POA: Diagnosis present

## 2023-09-06 DIAGNOSIS — Z5189 Encounter for other specified aftercare: Secondary | ICD-10-CM | POA: Diagnosis not present

## 2023-09-06 DIAGNOSIS — N390 Urinary tract infection, site not specified: Secondary | ICD-10-CM | POA: Diagnosis not present

## 2023-09-06 DIAGNOSIS — Z79899 Other long term (current) drug therapy: Secondary | ICD-10-CM | POA: Diagnosis not present

## 2023-09-06 DIAGNOSIS — Z5111 Encounter for antineoplastic chemotherapy: Secondary | ICD-10-CM | POA: Insufficient documentation

## 2023-09-06 MED ORDER — SODIUM CHLORIDE 0.9 % IV SOLN
Freq: Once | INTRAVENOUS | Status: AC
  Filled 2023-09-06: qty 250

## 2023-09-06 MED ORDER — HEPARIN SOD (PORK) LOCK FLUSH 100 UNIT/ML IV SOLN
500.0000 [IU] | Freq: Once | INTRAVENOUS | Status: AC | PRN
Start: 2023-09-06 — End: 2023-09-06
  Administered 2023-09-06: 500 [IU]
  Filled 2023-09-06: qty 5

## 2023-09-06 MED ORDER — PEGFILGRASTIM INJECTION 6 MG/0.6ML ~~LOC~~
6.0000 mg | PREFILLED_SYRINGE | Freq: Once | SUBCUTANEOUS | Status: AC
  Administered 2023-09-06: 6 mg via SUBCUTANEOUS
  Filled 2023-09-06: qty 0.6

## 2023-09-06 NOTE — Patient Instructions (Signed)
Dehydration, Adult Dehydration is when you lose more fluids from the body than you take in. Vital organs like the kidneys, brain, and heart cannot function without a proper amount of fluids and salt. Any loss of fluids from the body can cause dehydration.  CAUSES   Vomiting.  Diarrhea.  Excessive sweating.  Excessive urine output.  Fever. SYMPTOMS  Mild dehydration  Thirst.  Dry lips.  Slightly dry mouth. Moderate dehydration  Very dry mouth.  Sunken eyes.  Skin does not bounce back quickly when lightly pinched and released.  Dark urine and decreased urine production.  

## 2023-09-10 ENCOUNTER — Inpatient Hospital Stay: Payer: 59

## 2023-09-10 ENCOUNTER — Other Ambulatory Visit: Payer: Self-pay | Admitting: *Deleted

## 2023-09-10 ENCOUNTER — Inpatient Hospital Stay (HOSPITAL_BASED_OUTPATIENT_CLINIC_OR_DEPARTMENT_OTHER): Payer: 59 | Admitting: Hospice and Palliative Medicine

## 2023-09-10 ENCOUNTER — Other Ambulatory Visit: Payer: Self-pay

## 2023-09-10 ENCOUNTER — Ambulatory Visit
Admission: RE | Admit: 2023-09-10 | Discharge: 2023-09-10 | Disposition: A | Discharge status: Home / Self Care | Payer: 59 | Attending: Hospice and Palliative Medicine | Admitting: Hospice and Palliative Medicine

## 2023-09-10 ENCOUNTER — Telehealth: Payer: Self-pay

## 2023-09-10 ENCOUNTER — Encounter: Payer: Self-pay | Admitting: Hospice and Palliative Medicine

## 2023-09-10 ENCOUNTER — Ambulatory Visit
Admission: RE | Admit: 2023-09-10 | Discharge: 2023-09-10 | Disposition: A | Discharge status: Home / Self Care | Payer: 59 | Source: Ambulatory Visit | Attending: Hospice and Palliative Medicine

## 2023-09-10 VITALS — BP 113/78 | HR 97 | Temp 98.1°F | Resp 20 | Ht <= 58 in | Wt 86.0 lb

## 2023-09-10 DIAGNOSIS — R296 Repeated falls: Secondary | ICD-10-CM | POA: Diagnosis not present

## 2023-09-10 DIAGNOSIS — R531 Weakness: Secondary | ICD-10-CM

## 2023-09-10 DIAGNOSIS — C20 Malignant neoplasm of rectum: Secondary | ICD-10-CM | POA: Diagnosis not present

## 2023-09-10 DIAGNOSIS — Z5111 Encounter for antineoplastic chemotherapy: Secondary | ICD-10-CM | POA: Diagnosis not present

## 2023-09-10 DIAGNOSIS — E860 Dehydration: Secondary | ICD-10-CM

## 2023-09-10 DIAGNOSIS — R300 Dysuria: Secondary | ICD-10-CM

## 2023-09-10 LAB — CMP (CANCER CENTER ONLY)
ALT: 15 U/L (ref 0–44)
AST: 18 U/L (ref 15–41)
Albumin: 3.3 g/dL — ABNORMAL LOW (ref 3.5–5.0)
Alkaline Phosphatase: 83 U/L (ref 38–126)
Anion gap: 8 (ref 5–15)
BUN: 10 mg/dL (ref 8–23)
CO2: 23 mmol/L (ref 22–32)
Calcium: 8.7 mg/dL — ABNORMAL LOW (ref 8.9–10.3)
Chloride: 101 mmol/L (ref 98–111)
Creatinine: 0.64 mg/dL (ref 0.44–1.00)
GFR, Estimated: >60 mL/min (ref >60)
Glucose, Bld: 104 mg/dL — ABNORMAL HIGH (ref 70–99)
Potassium: 4.1 mmol/L (ref 3.5–5.1)
Sodium: 132 mmol/L — ABNORMAL LOW (ref 135–145)
Total Bilirubin: 0.9 mg/dL (ref <1.2)
Total Protein: 6.5 g/dL (ref 6.5–8.1)

## 2023-09-10 LAB — CBC WITH DIFFERENTIAL (CANCER CENTER ONLY)
Abs Immature Granulocytes: 0.04 10*3/uL (ref 0.00–0.07)
Basophils Absolute: 0.1 10*3/uL (ref 0.0–0.1)
Basophils Relative: 1 %
Eosinophils Absolute: 0 10*3/uL (ref 0.0–0.5)
Eosinophils Relative: 0 %
HCT: 35.4 % — ABNORMAL LOW (ref 36.0–46.0)
Hemoglobin: 11.6 g/dL — ABNORMAL LOW (ref 12.0–15.0)
Immature Granulocytes: 1 %
Lymphocytes Relative: 18 %
Lymphs Abs: 1.2 10*3/uL (ref 0.7–4.0)
MCH: 32.9 pg (ref 26.0–34.0)
MCHC: 32.8 g/dL (ref 30.0–36.0)
MCV: 100.3 fL — ABNORMAL HIGH (ref 80.0–100.0)
Monocytes Absolute: 0.4 10*3/uL (ref 0.1–1.0)
Monocytes Relative: 6 %
Neutro Abs: 5.1 10*3/uL (ref 1.7–7.7)
Neutrophils Relative %: 74 %
Platelet Count: 46 10*3/uL — ABNORMAL LOW (ref 150–400)
RBC: 3.53 MIL/uL — ABNORMAL LOW (ref 3.87–5.11)
RDW: 15 % (ref 11.5–15.5)
Smear Review: DECREASED
WBC Count: 6.8 10*3/uL (ref 4.0–10.5)
nRBC: 0 % (ref 0.0–0.2)

## 2023-09-10 LAB — MAGNESIUM: Magnesium: 1.8 mg/dL (ref 1.7–2.4)

## 2023-09-10 MED ORDER — SODIUM CHLORIDE 0.9% FLUSH
10.0000 mL | Freq: Once | INTRAVENOUS | Status: AC
  Administered 2023-09-10: 10 mL via INTRAVENOUS
  Filled 2023-09-10: qty 10

## 2023-09-10 MED ORDER — HEPARIN SOD (PORK) LOCK FLUSH 100 UNIT/ML IV SOLN
500.0000 [IU] | Freq: Once | INTRAVENOUS | Status: AC
  Administered 2023-09-10: 500 [IU] via INTRAVENOUS
  Filled 2023-09-10: qty 5

## 2023-09-10 MED ORDER — SODIUM CHLORIDE 0.9 % IV SOLN
INTRAVENOUS | Status: AC
  Filled 2023-09-10 (×2): qty 250

## 2023-09-10 NOTE — Telephone Encounter (Signed)
patients daughter called and said the the patient fell yesterday. she said patient was reaching up to turn on the dryer in the hallway (dryer on top of washer) and her left leg gave out on her. She didn't hit her head but is very sore and tender on her tailbone and left hip. Daughter is wondering if the patient needs to be seen by someone. She needed help getting out of the floor and needed help going to the restroom last night. I advised the daughter that it would be normal to be sore and bruised and probably okay to just monitor for now. But I told her I would check with someone.  Veronica Boyd states he will see patient today at 10:45. Patients daughter understands and agrees to appt.

## 2023-09-10 NOTE — Progress Notes (Signed)
Symptom Management Clinic [facility] at [facility] Telephone:([phone]) [phone] Fax:([phone]) [phone]  Patient Care Team: Barbette Reichmann, MD as PCP - General (Internal Medicine) Antonieta Iba, MD as PCP - Cardiology (Cardiology) Jeralyn Ruths, MD as Consulting Physician (Oncology) Benita Gutter, RN as Oncology Nurse Navigator Salena Saner, MD as Consulting Physician (Pulmonary Disease)   Name of the patient: Veronica Boyd  [id number]  May 27, 1946   Date of visit: 09/10/23  Reason for Consult: Veronica Boyd is a 77 y.o. female with multiple medical problems including stage IV rectal cancer, history of follicular lymphoma.  Patient is status post chemotherapy and RT.  Plan was for surgical evaluation but patient was found to have disease progression with pulmonary nodules.  Hospice was discussed but patient ultimately opted to pursue further treatment.  Interval history: Patient last saw Dr. Orlie Dakin in 09/04/2023 and received cycle 10 FOLFOX plus Avastin  Patient presents [facility] today for evaluation after a mechanical fall earlier yesterday.  Patient says that she was ambulating down the hall and may have tripped and fallen on her backside.  Denies hitting her head.  Denies loss of consciousness.  Had difficulty standing and had to call her daughter who assisted her.  Patient has subsequently been able to bear weight but says that she has increased pain today and sacrum.  Daughter reports that patient has also complained of hip pain..  Daughter says that patient's appetite and oral intake have been poor.  Yesterday, patient had very little to drink.  Daughter complains of dark-colored urine.  Patient denies dysuria, urinary frequency or urgency.  Denies any neurologic complaints. Denies recent fevers or illnesses. Denies any easy bleeding or bruising. Denies chest pain. Denies any constipation, or diarrhea. Denies urinary complaints. Patient offers  no further specific complaints today.    PAST MEDICAL HISTORY: Past Medical History:  Diagnosis Date   Anemia due to antineoplastic chemotherapy    Aortic atherosclerosis (HCC)    Arthritis    Atrial fibrillation and flutter (HCC)    a.) 04/2023 Zio: Monitor 1 - 16 % aflutter burden w/ avg rate of 168, (78-226), possible Atach. Freq PACs, rare PVCs. Monitor 2 - 15% afib/flutter burden @ 115 [phone] Poss Atach. 4 beats NSVT. Freq PACs, rare PVCs; b.)  CHA2DS2-VASc = 4 (age x2, sex, vascular disease history); c.) cardiac rate/rhythm maintained on oral metoprolol succinate; chronically anticoagulated using apixaban   Bronchial obstruction    Chemotherapy-induced neuropathy (HCC)    Chicken pox    Colon polyp    Coronary artery calcification seen on CT scan    a. 11/2022 CT Chest: Coronary artery calcification and aortic atherosclerosis.   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 04/2023 Echo: EF >55%, nl RV size/fxn.  No significant valvular disease observed.   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, cN2a, cM1a)   Thrombocytopenia (HCC)     PAST SURGICAL HISTORY:  Past Surgical History:  Procedure Laterality Date   AXILLARY LYMPH NODE DISSECTION Right 08/21/2016   Procedure: AXILLARY LYMPH NODE  excision;  Surgeon: Nadeen Landau, MD;  Location: [facility] ORS;  Service: General;  Laterality: Right;   CATARACT EXTRACTION, BILATERAL Bilateral    COLONOSCOPY N/A 09/19/2020   Procedure: COLONOSCOPY;  Surgeon: Regis Bill, MD;  Location: [facility] ENDOSCOPY;  Service: Endoscopy;  Laterality: N/A;   ENDOBRONCHIAL ULTRASOUND Left  08/05/2023   Procedure: ENDOBRONCHIAL ULTRASOUND;  Surgeon: Salena Saner, MD;  Location: [facility] ORS;  Service: Pulmonary;  Laterality: Left;   PORTA CATH INSERTION N/A 09/16/2017   Procedure: PORTA CATH INSERTION;  Surgeon: Annice Needy, MD;  Location: [facility] INVASIVE CV LAB;  Service: Cardiovascular;  Laterality: N/A;   TONSILLECTOMY     TOTAL HIP ARTHROPLASTY Left 1992    HEMATOLOGY/ONCOLOGY HISTORY:  Oncology History Overview Note  Initial diagnosis 2014. S/p radiation 3 years ago.  Noted increase lymphadenopathy and biopsy was recommended.  Unfortunately she refused and was lost to follow-up. Return to clinic in September 2017 with increased swelling surrounding her right collarbone.  Had biopsy that was suggestive of follicular lymphoma but not definitive.  Had PET scan to complete the staging work-up.  PET scan showed hypermetabolic adenopathy in bilateral subpectoral and axillary regions, right internal mammary chain and right paratracheal and subclavicular regions.  There is no evidence of hypermetabolic lymphadenopathy within the abdomen or pelvis.  Received Zevalin on July 30, 2017 with not much therapeutic effect.  She subsequently underwent cycles 5 of R CHOP chemotherapy with Neulasta support completing on December 12, 2017.  No further intervention or treatments are needed at this time.  PET scan results from February 19, 2018 reviewed independently and reported as above with no suspicious finding for active lymphoma.      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  MEDICATIONS:  Current Outpatient Medications  Medication Sig Dispense Refill   amiodarone (PACERONE) 200 MG tablet Take 1 tablet (200 mg) by mouth twice daily for 2 weeks, then decrease to 1 tablet (200 mg) by mouth daily. 90 tablet 3   apixaban (ELIQUIS) 5 MG TABS tablet Take 1 tablet (5 mg total) by mouth 2 (two) times daily. 180 tablet 3   beclomethasone (QVAR REDIHALER) 80 MCG/ACT inhaler Inhale 2 puffs into the lungs 2 (two) times daily. (Patient not taking: Reported on 08/21/2023) 10.6 g 4   diclofenac sodium (VOLTAREN) 1 % GEL Apply 2 g topically 2 (two) times daily as needed.     diphenoxylate-atropine (LOMOTIL) 2.5-0.025 MG tablet TAKE 2 TABLETS BY MOUTH 4 (FOUR) TIMES DAILY AS NEEDED FOR DIARRHEA OR LOOSE STOOLS. 60 tablet 1   gabapentin (NEURONTIN) 300 MG capsule TAKE 1 CAPSULE BY MOUTH TWICE A DAY 60 capsule 2   KLOR-CON M20 20 MEQ tablet TAKE 1 TABLET BY MOUTH TWICE A DAY 180 tablet 1  lidocaine-prilocaine (EMLA) cream Apply 1 Application topically as needed. Apply to port 1 hour prior to use as needed 30 g 2   megestrol (MEGACE) 40 MG tablet Take 1 tablet (40 mg total) by mouth daily. 90 tablet 1   metoprolol succinate (TOPROL XL) 25 MG 24 hr tablet Take 0.5 tablets (12.5 mg total) by mouth at bedtime. 30 tablet 2   ondansetron (ZOFRAN) 8 MG tablet Take 1 tablet (8 mg total) by mouth every 8 (eight) hours as needed for nausea or vomiting. Start on the third day after chemotherapy. 60 tablet 2   pantoprazole (PROTONIX) 20 MG tablet Take 1 tablet by mouth daily as needed for heartburn.     simvastatin (ZOCOR) 20 MG tablet Take 20 mg by mouth at bedtime.     traMADol-acetaminophen (ULTRACET) 37.5-325 MG tablet Take 1 tablet by mouth every 8 (eight) hours as needed.     No  current facility-administered medications for this visit.   Facility-Administered Medications Ordered in Other Visits  Medication Dose Route Frequency Provider Last Rate Last Admin   heparin lock flush 100 unit/mL  500 Units Intravenous Once Orlie Dakin, Tollie Pizza, MD       heparin lock flush 100 unit/mL  500 Units Intracatheter PRN Orlie Dakin, Tollie Pizza, MD       heparin lock flush 100 unit/mL  500 Units Intravenous Once Jeralyn Ruths, MD       heparin lock flush 100 unit/mL  500 Units Intravenous Once Jeralyn Ruths, MD       sodium chloride flush (NS) 0.9 % injection 10 mL  10 mL Intravenous PRN Jeralyn Ruths, MD   10 mL at 12/04/17 0901   sodium chloride flush (NS) 0.9 % injection 10 mL  10 mL Intracatheter PRN Jeralyn Ruths, MD       sodium chloride flush (NS) 0.9 % injection 10 mL  10 mL Intravenous PRN Jeralyn Ruths, MD   10 mL at 04/24/21 0839   sodium chloride flush (NS) 0.9 % injection 10 mL  10 mL Intracatheter PRN Jeralyn Ruths, MD   10 mL at 04/12/23 1419   yttrium-90 injection 22.3 millicurie  22.3 millicurie Intravenous Once Chrystal, Sherrine Maples, MD        VITAL SIGNS: There were no vitals taken for this visit. There were no vitals filed for this visit.   Estimated body mass index is 17.97 kg/m as calculated from the following:   Height as of 09/04/23: 4\' 10"  (1.473 m).   Weight as of 09/04/23: 86 lb (39 kg).  LABS: CBC:    Component Value Date/Time   WBC 8.4 09/04/2023 0814   WBC 12.4 (H) 08/02/2023 1315   HGB 11.9 (L) 09/04/2023 0814   HGB 13.7 09/28/2013 1538   HCT 36.9 09/04/2023 0814   HCT 41.0 09/28/2013 1538   PLT 129 (L) 09/04/2023 0814   PLT 229 09/28/2013 1538   MCV 101.4 (H) 09/04/2023 0814   MCV 93 09/28/2013 1538   NEUTROABS 4.8 09/04/2023 0814   NEUTROABS 5.3 09/28/2013 1538   LYMPHSABS 2.2 09/04/2023 0814   LYMPHSABS 1.8 09/28/2013 1538   MONOABS 1.3 (H) 09/04/2023 0814   MONOABS 0.9 09/28/2013 1538   EOSABS 0.1  09/04/2023 0814   EOSABS 0.1 09/28/2013 1538   BASOSABS 0.0 09/04/2023 0814   BASOSABS 0.0 09/28/2013 1538   Comprehensive Metabolic Panel:    Component Value Date/Time   NA 134 (L) 09/04/2023 0814   K 4.4 09/04/2023 1610  CL 102 09/04/2023 0814   CO2 24 09/04/2023 0814   BUN 10 09/04/2023 0814   CREATININE 0.71 09/04/2023 0814   GLUCOSE 96 09/04/2023 0814   CALCIUM 8.7 (L) 09/04/2023 0814   AST 23 09/04/2023 0814   ALT 14 09/04/2023 0814   ALKPHOS 69 09/04/2023 0814   BILITOT 0.4 09/04/2023 0814   PROT 6.5 09/04/2023 0814   ALBUMIN 3.3 (L) 09/04/2023 0814    RADIOGRAPHIC STUDIES: No results found.  PERFORMANCE STATUS (ECOG) : 1 - Symptomatic but completely ambulatory  Review of Systems Unless otherwise noted, a complete review of systems is negative.  Physical Exam General: NAD Cardiovascular: regular rate and rhythm Pulmonary: clear ant fields Abdomen: soft, nontender, + bowel sounds GU: no suprapubic tenderness Extremities: no edema, no joint deformities Skin: no rashes Neurological: Weakness but otherwise nonfocal  Assessment and Plan:  Stage IV rectal cancer -on treatment with FOLFOX plus Bev  Mechanical fall -subsequent sacral/hip pain.  Will send for x-rays.  Patient has tramadol and acetaminophen to take as needed for pain.  Daughter is assisting with ambulation.  Discolored urine -will check UA/culture.  Patient reports poor fluid intake so likely has component of urinary concentration.  Will give IV fluids today.  RTC next week to see Dr. Orlie Dakin  Patient expressed understanding and was in agreement with this plan. She also understands that She can call clinic at any time with any questions, concerns, or complaints.   Thank you for allowing me to participate in the care of this very pleasant patient.   Time Total: 15 minutes  Visit consisted of counseling and education dealing with the complex and emotionally intense issues of symptom management in  the setting of serious illness.Greater than 50%  of this time was spent counseling and coordinating care related to the above assessment and plan.  Signed by: Laurette Schimke, PhD, NP-C

## 2023-09-11 ENCOUNTER — Other Ambulatory Visit: Payer: Self-pay

## 2023-09-11 ENCOUNTER — Other Ambulatory Visit: Payer: 59

## 2023-09-11 ENCOUNTER — Other Ambulatory Visit: Payer: Self-pay | Admitting: Lab

## 2023-09-11 ENCOUNTER — Telehealth: Payer: Self-pay | Admitting: *Deleted

## 2023-09-11 ENCOUNTER — Ambulatory Visit: Payer: 59

## 2023-09-11 ENCOUNTER — Ambulatory Visit: Payer: 59 | Admitting: Nurse Practitioner

## 2023-09-11 DIAGNOSIS — R300 Dysuria: Secondary | ICD-10-CM

## 2023-09-11 DIAGNOSIS — Z5111 Encounter for antineoplastic chemotherapy: Secondary | ICD-10-CM | POA: Diagnosis not present

## 2023-09-11 DIAGNOSIS — C20 Malignant neoplasm of rectum: Secondary | ICD-10-CM

## 2023-09-11 LAB — URINALYSIS, COMPLETE (UACMP) WITH MICROSCOPIC
Bilirubin Urine: NEGATIVE
Glucose, UA: NEGATIVE mg/dL
Ketones, ur: NEGATIVE mg/dL
Nitrite: NEGATIVE
Protein, ur: NEGATIVE mg/dL
Specific Gravity, Urine: 1.016 (ref 1.005–1.030)
pH: 5 (ref 5.0–8.0)

## 2023-09-11 NOTE — Telephone Encounter (Signed)
RN contacted patient's daughter regarding the xray results of sacrum and hip. Explained that hip xray is normal, but pt has a "minimally displaced sacral fracture." Pt daughter not interested in an orthopedic referral. She understands that this sacral fracture may take some time to heal. She stated that pt is using the tramadol to help with pain and this is controlling the pain. She thanked me for calling and will call our office back if pt's pain becomes worse.

## 2023-09-12 ENCOUNTER — Other Ambulatory Visit: Payer: 59

## 2023-09-12 ENCOUNTER — Ambulatory Visit: Payer: 59

## 2023-09-12 ENCOUNTER — Ambulatory Visit: Payer: 59 | Admitting: Oncology

## 2023-09-13 ENCOUNTER — Ambulatory Visit: Payer: 59

## 2023-09-13 ENCOUNTER — Encounter: Payer: Self-pay | Admitting: Oncology

## 2023-09-13 LAB — URINE CULTURE: Culture: 80000 — AB

## 2023-09-15 NOTE — Progress Notes (Unsigned)
Cardiology Office Note Date:  09/16/2023  Patient ID:  Veronica Boyd Nov 16, 1945, MRN [id number] PCP:  Barbette Reichmann, MD  Cardiologist:  Julien Nordmann, MD Electrophysiologist: Nobie Putnam, MD (though has not met)    Chief Complaint: afib follow-up  History of Present Illness: Veronica Boyd is a 77 y.o. female with PMH notable for parox Afib, rectal cancer; seen today for routine EP follow-up.  I saw her about a month ago where she was in afib w RVR, having palpitations and generally feeling unwell. Previous to this she had a zio monitor reveal 15-16% AFib burden, but was feeling well and so wanted to avoid AAD. During our appointment, amiodarone was started.   On follow-up today, she continues to feel weak, dizzy, states it feels like she has been drinking ETOH but has not. She has also fallen several times since last being seen, fractured her tailbone. She is not having pain from this except in certain positions, which she can avoid. Appetite is stable.  Her pulse has continued to be elevated, but is not labile by home pulse-ox readings. It has maintained above 100bpm according to daughter.   She is a little teary today in office about how weak her legs are. During most recent episode of stumbling, she sat on the ground and did not have the strength to get herself into bed or a low chair.   She continues to take eliquis 5mg  BID, no bleeding concerns.  She denies chest pain, palpitations, dyspnea, PND, orthopnea, nausea, vomiting, dizziness, syncope, edema, weight gain, or early satiety.       AAD History: Amiodarone   Past Medical History:  Diagnosis Date   Anemia due to antineoplastic chemotherapy    Aortic atherosclerosis (HCC)    Arthritis    Atrial fibrillation and flutter (HCC)    a.) 04/2023 Zio: Monitor 1 - 16 % aflutter burden w/ avg rate of 168, (78-226), possible Atach. Freq PACs, rare PVCs. Monitor 2 - 15% afib/flutter burden @ 115 [phone] Poss  Atach. 4 beats NSVT. Freq PACs, rare PVCs; b.)  CHA2DS2-VASc = 4 (age x2, sex, vascular disease history); c.) cardiac rate/rhythm maintained on oral metoprolol succinate; chronically anticoagulated using apixaban   Bronchial obstruction    Chemotherapy-induced neuropathy (HCC)    Chicken pox    Colon polyp    Coronary artery calcification seen on CT scan    a. 11/2022 CT Chest: Coronary artery calcification and aortic atherosclerosis.   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 04/2023 Echo: EF >55%, nl RV size/fxn.  No significant valvular disease observed.   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, cN2a, cM1a)   Thrombocytopenia (HCC)     Past Surgical History:  Procedure Laterality Date   AXILLARY LYMPH NODE DISSECTION Right 08/21/2016   Procedure: AXILLARY LYMPH NODE excision;  Surgeon: Nadeen Landau, MD;  Location: [facility] ORS;  Service: General;  Laterality: Right;   CATARACT EXTRACTION, BILATERAL Bilateral    COLONOSCOPY N/A 09/19/2020   Procedure: COLONOSCOPY;  Surgeon: Regis Bill, MD;  Location: [facility] ENDOSCOPY;  Service: Endoscopy;  Laterality: N/A;   ENDOBRONCHIAL ULTRASOUND Left 08/05/2023   Procedure: ENDOBRONCHIAL ULTRASOUND;  Surgeon: Salena Saner, MD;  Location: [facility] ORS;  Service: Pulmonary;  Laterality: Left;   PORTA CATH INSERTION N/A 09/16/2017   Procedure: PORTA CATH INSERTION;  Surgeon: Annice Needy, MD;  Location: [facility] INV[facility]IVE CV [facility];  Service: Cardiovascular;  Laterality: N/A;   TONSILLECTOMY     TOTAL HIP ARTHROPLASTY Left 1992    Current Outpatient Medications  Medication Instructions    amiodarone (PACERONE) 200 MG tablet Take 1 tablet (200 mg) by mouth twice daily for 2 weeks, then decrease to 1 tablet (200 mg) by mouth daily.   apixaban (ELIQUIS) 5 mg, Oral, 2 times daily   diclofenac sodium (VOLTAREN) 2 g, Topical, 2 times daily PRN   diphenoxylate-atropine (LOMOTIL) 2.5-0.025 MG tablet 2 tablets, Oral, 4 times daily PRN   gabapentin (NEURONTIN) 300 mg, Oral, 2 times daily   KLOR-CON M20 20 MEQ tablet 20 mEq, Oral, 2 times daily   lidocaine-prilocaine (EMLA) cream 1 Application, Topical, As needed, Apply to port 1 hour prior to use as needed   megestrol (MEGACE) 40 mg, Oral, Daily   ondansetron (ZOFRAN) 8 mg, Oral, Every 8 hours PRN, Start on the third day after chemotherapy.   pantoprazole (PROTONIX) 20 MG tablet 1 tablet, Oral, Daily PRN   simvastatin (ZOCOR) 20 mg, Oral, Daily at bedtime   traMADol-acetaminophen (ULTRACET) 37.5-325 MG tablet 1 tablet, Oral, Every 8 hours PRN    Social History:  The patient  reports that she has never smoked. She has never used smokeless tobacco. She reports that she does not drink alcohol and does not use drugs.   Family History:  The patient's family history includes Basal cell carcinoma in her daughter; Breast cancer in her paternal aunt; Diabetes in her brother and sister; Emphysema in her father; Heart failure in her mother; Lung cancer in her brother.  ROS:  Please see the history of present illness. All other systems are reviewed and otherwise negative.   PHYSICAL EXAM:  VS:  BP 90/60 (BP Location: Left Arm, Patient Position: Sitting, Cuff Size: Normal)   Pulse (!) 106   Ht 4\' 10"  (1.473 m)   Wt 81 lb 2 oz (36.8 kg)   SpO2 96%   BMI 16.96 kg/m  BMI: Body mass index is 16.96 kg/m.  GEN- frail, thin appearing, alert and oriented x 3 today.   Lungs- CTA,  normal work of breathing.  Heart- Regular rate and rhythm, no murmurs, rubs or gallops Extremities- No peripheral edema, warm, dry   EKG is ordered. Personal review of  EKG from today shows:   EKG Interpretation Date/Time:  Monday September 16 2023 14:36:28 EST Ventricular Rate:  106 PR Interval:  186 QRS Duration:  70 QT Interval:  324 QTC Calculation: 430 R Axis:   262  Text Interpretation: Sinus tachycardia Right superior axis deviation Confirmed by Sherie Don [phone]) on 09/16/2023 2:48:21 PM     Recent Labs: 11/13/2022: B Natriuretic Peptide 67.2 08/14/2023: TSH 2.000 09/10/2023: ALT 15; BUN 10; Creatinine 0.64; Hemoglobin 11.6; Magnesium 1.8; Platelet Count 46; Potassium 4.1; Sodium 132  No results found for requested labs within last 365 days.   Estimated Creatinine Clearance: 33.1 mL/min (by C-G formula based on SCr of 0.64 mg/dL).   Wt Readings from Last 3 Encounters:  09/16/23 81 lb 2 oz (36.8 kg)  09/10/23 86 lb (39 kg)  09/04/23 86 lb (39  kg)     Additional studies reviewed include: Previous EP, cardiology notes.   NM myocardial, 07/16/2023   Low risk, probably normal pharmacologic myocardial perfusion stress test.   There is a small in size, severe, fixed defect in the apical lateral segment most consistent with artifact but cannot rule out small area of scar.   No significant ischemia is identified.   Left ventricular systolic function is normal (LVEF greater than 65%).   Coronary artery calcification and aortic atherosclerosis are noted.  Central venous catheter extends into the distal SVC.   Pectus excavatum is evident.  Long term monitor, 06/10/2023 Monitor 1 Normal sinus rhythm Patient had a min HR of 50 bpm, max HR of 226 bpm, and avg HR of 102 bpm.  Atrial Flutter occurred (16% burden), ranging from 78-226 bpm (avg of 126 bpm), the longest lasting 31 mins 49 secs with an avg rate of 168 bpm.  Possible atrial tachycardia Isolated SVEs were frequent (7.0%, [id number]), SVE Couplets were rare (<1.0%, 1946), and SVE Triplets were rare (<1.0%, 824).  Isolated VEs were rare (<1.0%), VE Couplets were rare (<1.0%), and no VE Triplets  were present.    Monitor 2 Normal sinus rhythm Patient had a min HR of 48 bpm, max HR of 235 bpm, and avg HR of 100 bpm.   1 run of Ventricular Tachycardia occurred lasting 4 beats with a max rate of 226 bpm (avg 202 bpm).   Atrial Fibrillation/Flutter occurred (15% burden), ranging from 66-235 bpm (avg of 115 bpm), the longest lasting 22 mins 56 secs with an avg rate of 102 bpm.  Possible atrial tachycardia.  Isolated SVEs were frequent (9.0%, [id number]), SVE Couplets were occasional (1.4%, 14036), and SVE Triplets were rare (<1.0%, 6711).  Isolated VEs were rare (<1.0%), VE Couplets were rare (<1.0%), and no VE Triplets were present.   TTE, 04/08/2023  1. Left ventricular ejection fraction, by estimation, is >55%. The left ventricle has normal function. Left ventricular endocardial border not optimally defined to evaluate regional wall motion. Left ventricular diastolic function could not be evaluated.   2. Right ventricular systolic function is normal. The right ventricular size is normal.   3. The mitral valve was not well visualized. No evidence of mitral valve  regurgitation.   4. Tricuspid valve regurgitation not well assessed.   5. The aortic valve was not well visualized. Aortic valve regurgitation not well assessed.   6. Pulmonic valve regurgitation not well assessed.   ASSESSMENT AND PLAN:  #) parox afib  #) high risk medication use - amiodarone #) hypotension #) tachycardia #) recurrent stage 4 rectal cancer Zio earlier this year showed 15-16% afib burden Recently started amiodarone and has converted to sinus tach Will stop metoprolol given hypotension, dizziness Do not favor increasing amiodarone or starting dig for tachycardia given frailty  LFTs stable Update thyroid labs later this week with oncology lab draws Recommended to discuss goals of treatment and care with Dr. Orlie Dakin later this week given ongoing weakness and frequent falls.    #) Hypercoag d/t parox  afib CHA2DS2-VASc Score = 4 [CHF History: 0, HTN History: 0, Diabetes History: 0, Stroke History: 0, Vascular Disease History: 1, Age Score: 2, Gender Score: 1].  Therefore, the patient's annual risk of stroke is 4.8 %.    Stroke ppx - 5mg  eliquis BID, appropriately dosed. Will need dose reduction to 2.5mg  in 11/2023  No bleeding concerns Continue to monitor falls, may need to stop OAC if continues to have  falls     Current medicines are reviewed at length with the patient today.   The patient does not have concerns regarding her medicines.  The following changes were made today:   STOP 12.5mg  toprol  Labs/ tests ordered today include:  Orders Placed This Encounter  Procedures   TSH   T4, free   EKG 12-Lead     Disposition: Follow up with EP APP  2 months    Signed, Sherie Don, NP  09/16/23  3:48 PM  Electrophysiology [facility]

## 2023-09-16 ENCOUNTER — Ambulatory Visit: Payer: 59 | Attending: Cardiology | Admitting: Cardiology

## 2023-09-16 ENCOUNTER — Encounter: Payer: Self-pay | Admitting: Cardiology

## 2023-09-16 VITALS — BP 90/60 | HR 106 | Ht <= 58 in | Wt 81.1 lb

## 2023-09-16 DIAGNOSIS — Z79899 Other long term (current) drug therapy: Secondary | ICD-10-CM | POA: Diagnosis not present

## 2023-09-16 DIAGNOSIS — I480 Paroxysmal atrial fibrillation: Secondary | ICD-10-CM

## 2023-09-16 DIAGNOSIS — D6869 Other thrombophilia: Secondary | ICD-10-CM

## 2023-09-16 NOTE — Patient Instructions (Signed)
Medication Instructions:  STOP Metoprolol   *If you need a refill on your cardiac medications before your next appointment, please call your pharmacy*   Lab Work: Have oncology clinic check TSH, Free T4.   If you have labs (blood work) drawn today and your tests are completely normal, you will receive your results only by: MyChart Message (if you have MyChart) OR A paper copy in the mail If you have any lab test that is abnormal or we need to change your treatment, we will call you to review the results.    Follow-Up: At [facility], you and your health needs are our priority.  As part of our continuing mission to provide you with exceptional heart care, we have created designated Provider Care Teams.  These Care Teams include your primary Cardiologist (physician) and Advanced Practice Providers (APPs -  Physician Assistants and Nurse Practitioners) who all work together to provide you with the care you need, when you need it.  We recommend signing up for the patient portal called "MyChart".  Sign up information is provided on this After Visit Summary.  MyChart is used to connect with patients for Virtual Visits (Telemedicine).  Patients are able to view lab/test results, encounter notes, upcoming appointments, etc.  Non-urgent messages can be sent to your provider as well.   To learn more about what you can do with MyChart, go to [email or web address].    Your next appointment:   2 month(s)  Provider:   Sherie Don, NP

## 2023-09-17 ENCOUNTER — Other Ambulatory Visit: Payer: Self-pay

## 2023-09-18 ENCOUNTER — Encounter: Payer: Self-pay | Admitting: Oncology

## 2023-09-18 ENCOUNTER — Inpatient Hospital Stay: Payer: 59

## 2023-09-18 ENCOUNTER — Inpatient Hospital Stay (HOSPITAL_BASED_OUTPATIENT_CLINIC_OR_DEPARTMENT_OTHER): Payer: 59 | Admitting: Oncology

## 2023-09-18 VITALS — BP 113/77 | HR 107 | Temp 96.2°F | Wt 83.6 lb

## 2023-09-18 VITALS — BP 120/74 | HR 87

## 2023-09-18 DIAGNOSIS — C20 Malignant neoplasm of rectum: Secondary | ICD-10-CM | POA: Diagnosis not present

## 2023-09-18 DIAGNOSIS — Z5111 Encounter for antineoplastic chemotherapy: Secondary | ICD-10-CM | POA: Diagnosis not present

## 2023-09-18 LAB — CBC WITH DIFFERENTIAL (CANCER CENTER ONLY)
Abs Immature Granulocytes: 0.11 10*3/uL — ABNORMAL HIGH (ref 0.00–0.07)
Basophils Absolute: 0.1 10*3/uL (ref 0.0–0.1)
Basophils Relative: 1 %
Eosinophils Absolute: 0.1 10*3/uL (ref 0.0–0.5)
Eosinophils Relative: 1 %
HCT: 37.2 % (ref 36.0–46.0)
Hemoglobin: 11.8 g/dL — ABNORMAL LOW (ref 12.0–15.0)
Immature Granulocytes: 1 %
Lymphocytes Relative: 22 %
Lymphs Abs: 2.2 10*3/uL (ref 0.7–4.0)
MCH: 32.3 pg (ref 26.0–34.0)
MCHC: 31.7 g/dL (ref 30.0–36.0)
MCV: 101.9 fL — ABNORMAL HIGH (ref 80.0–100.0)
Monocytes Absolute: 1.5 10*3/uL — ABNORMAL HIGH (ref 0.1–1.0)
Monocytes Relative: 15 %
Neutro Abs: 6.3 10*3/uL (ref 1.7–7.7)
Neutrophils Relative %: 60 %
Platelet Count: 100 10*3/uL — ABNORMAL LOW (ref 150–400)
RBC: 3.65 MIL/uL — ABNORMAL LOW (ref 3.87–5.11)
RDW: 16.5 % — ABNORMAL HIGH (ref 11.5–15.5)
WBC Count: 10.3 10*3/uL (ref 4.0–10.5)
nRBC: 0 % (ref 0.0–0.2)

## 2023-09-18 LAB — CMP (CANCER CENTER ONLY)
ALT: 13 U/L (ref 0–44)
AST: 24 U/L (ref 15–41)
Albumin: 3.2 g/dL — ABNORMAL LOW (ref 3.5–5.0)
Alkaline Phosphatase: 82 U/L (ref 38–126)
Anion gap: 10 (ref 5–15)
BUN: 9 mg/dL (ref 8–23)
CO2: 25 mmol/L (ref 22–32)
Calcium: 9 mg/dL (ref 8.9–10.3)
Chloride: 100 mmol/L (ref 98–111)
Creatinine: 0.66 mg/dL (ref 0.44–1.00)
GFR, Estimated: >60 mL/min (ref >60)
Glucose, Bld: 93 mg/dL (ref 70–99)
Potassium: 4 mmol/L (ref 3.5–5.1)
Sodium: 135 mmol/L (ref 135–145)
Total Bilirubin: 0.3 mg/dL (ref <1.2)
Total Protein: 6.6 g/dL (ref 6.5–8.1)

## 2023-09-18 MED ORDER — SULFAMETHOXAZOLE-TRIMETHOPRIM 800-160 MG PO TABS: 1.0000 | ORAL_TABLET | Freq: Two times a day (BID) | ORAL | 0 refills | Status: DC

## 2023-09-18 MED ORDER — HEPARIN SOD (PORK) LOCK FLUSH 100 UNIT/ML IV SOLN
500.0000 [IU] | Freq: Once | INTRAVENOUS | Status: AC | PRN
  Administered 2023-09-18: 500 [IU]
  Filled 2023-09-18: qty 5

## 2023-09-18 MED ORDER — SODIUM CHLORIDE 0.9 % IV SOLN
Freq: Once | INTRAVENOUS | Status: AC
  Filled 2023-09-18: qty 250

## 2023-09-18 NOTE — Progress Notes (Signed)
[facility]  [phone] [phone]) [phone]  ID: Veronica Boyd OB: Nov 17, 1945  MR#: [id number]  LKG#:[id number]  Patient Care Team: Barbette Reichmann, MD as PCP - General (Internal Medicine) Antonieta Iba, MD as PCP - Cardiology (Cardiology) Nobie Putnam, MD as PCP - Electrophysiology (Cardiology) Jeralyn Ruths, MD as Consulting Physician (Oncology) Benita Gutter, RN as Oncology Nurse Navigator Salena Saner, MD as Consulting Physician (Pulmonary Disease)  CHIEF COMPLAINT: Recurrent stage IV rectal cancer, history of follicular lymphoma.  INTERVAL HISTORY: Patient returns to clinic today for further evaluation and consideration of cycle 11 of FOLFOX plus Avastin.  Last week she was seen in symptom management clinic after a fall and was found to have a UTI as well as a minimally displaced sacral fracture.  She no longer complains of pain.  She does have chronic weakness and fatigue.  She is to tolerate her treatments well without significant side effects.  She has a fair appetite and is maintaining her weight. She has a chronic peripheral neuropathy, but no other neurologic complaints.  She denies any recent fevers or illnesses. She denies any nausea, vomiting, constipation or diarrhea.  She has no urinary complaints.  Patient offers no further specific complaints today.  REVIEW OF SYSTEMS:   Review of Systems  Constitutional:  Positive for malaise/fatigue. Negative for fever and weight loss.  Respiratory: Negative.  Negative for cough, hemoptysis and shortness of breath.   Cardiovascular: Negative.  Negative for chest pain, palpitations and leg swelling.  Gastrointestinal:  Negative for abdominal pain, blood in stool, diarrhea, melena, nausea and vomiting.  Genitourinary: Negative.  Negative for dysuria.  Musculoskeletal: Negative.  Negative for back pain, joint pain and neck pain.  Skin: Negative.  Negative for rash.  Neurological:   Positive for tingling and sensory change. Negative for focal weakness, weakness and headaches.  Psychiatric/Behavioral: Negative.  The patient is not nervous/anxious and does not have insomnia.     As per HPI. Otherwise, a complete review of systems is negative.   PAST MEDICAL HISTORY: Past Medical History:  Diagnosis Date   Anemia due to antineoplastic chemotherapy    Aortic atherosclerosis (HCC)    Arthritis    Atrial fibrillation and flutter (HCC)    a.) 04/2023 Zio: Monitor 1 - 16 % aflutter burden w/ avg rate of 168, (78-226), possible Atach. Freq PACs, rare PVCs. Monitor 2 - 15% afib/flutter burden @ [phone]). Poss Atach. 4 beats NSVT. Freq PACs, rare PVCs; b.)  CHA2DS2-VASc = 4 (age x2, sex, vascular disease history); c.) cardiac rate/rhythm maintained on oral metoprolol succinate; chronically anticoagulated using apixaban   Bronchial obstruction    Chemotherapy-induced neuropathy (HCC)    Chicken pox    Colon polyp    Coronary artery calcification seen on CT scan    a. 11/2022 CT Chest: Coronary artery calcification and aortic atherosclerosis.   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 04/2023 Echo: EF >55%, nl RV size/fxn.  No significant valvular disease observed.   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,  cN2a, Darwin.Staples)   Thrombocytopenia (HCC)     PAST SURGICAL HISTORY: Past Surgical History:  Procedure Laterality Date   AXILLARY LYMPH NODE DISSECTION Right 08/21/2016   Procedure: AXILLARY LYMPH NODE excision;  Surgeon: Nadeen Landau, MD;  Location: [facility] ORS;  Service: General;  Laterality: Right;   CATARACT  EXTRACTION, BILATERAL Bilateral    COLONOSCOPY N/A 09/19/2020   Procedure: COLONOSCOPY;  Surgeon: Regis Bill, MD;  Location: [facility] ENDOSCOPY;  Service: Endoscopy;  Laterality: N/A;   ENDOBRONCHIAL ULTRASOUND Left 08/05/2023   Procedure: ENDOBRONCHIAL ULTRASOUND;  Surgeon: Salena Saner, MD;  Location: [facility] ORS;  Service: Pulmonary;  Laterality: Left;   PORTA CATH INSERTION N/A 09/16/2017   Procedure: PORTA CATH INSERTION;  Surgeon: Annice Needy, MD;  Location: [facility] INVASIVE CV LAB;  Service: Cardiovascular;  Laterality: N/A;   TONSILLECTOMY     TOTAL HIP ARTHROPLASTY Left 1992    FAMILY HISTORY: Family History  Problem Relation Age of Onset   Heart failure Mother    Emphysema Father    Diabetes Sister    Lung cancer Brother    Diabetes Brother    Breast cancer Paternal Aunt    Basal cell carcinoma Daughter     ADVANCED DIRECTIVES (Y/N):  N  HEALTH MAINTENANCE: Social History   Tobacco Use   Smoking status: Never   Smokeless tobacco: Never  Vaping Use   Vaping status: Never Used  Substance Use Topics   Alcohol use: No   Drug use: No     Colonoscopy:  PAP:  Bone density:  Lipid panel:  No Known Allergies  Current Outpatient Medications  Medication Sig Dispense Refill   amiodarone (PACERONE) 200 MG tablet Take 1 tablet (200 mg) by mouth twice daily for 2 weeks, then decrease to 1 tablet (200 mg) by mouth daily. 90 tablet 3   apixaban (ELIQUIS) 5 MG TABS tablet Take 1 tablet (5 mg total) by mouth 2 (two) times daily. 180 tablet 3   diclofenac sodium (VOLTAREN) 1 % GEL Apply 2 g topically 2 (two) times daily as needed.     diphenoxylate-atropine (LOMOTIL) 2.5-0.025 MG tablet TAKE 2 TABLETS BY MOUTH 4 (FOUR) TIMES DAILY AS NEEDED FOR DIARRHEA OR LOOSE STOOLS. 60 tablet 1   gabapentin (NEURONTIN) 300 MG capsule TAKE 1 CAPSULE BY MOUTH TWICE A DAY 60 capsule 2   KLOR-CON M20 20 MEQ tablet TAKE 1 TABLET BY MOUTH TWICE A DAY 180 tablet 1   lidocaine-prilocaine  (EMLA) cream Apply 1 Application topically as needed. Apply to port 1 hour prior to use as needed 30 g 2   megestrol (MEGACE) 40 MG tablet Take 1 tablet (40 mg total) by mouth daily. 90 tablet 1   ondansetron (ZOFRAN) 8 MG tablet Take 1 tablet (8 mg total) by mouth every 8 (eight) hours as needed for nausea or vomiting. Start on the third day after chemotherapy. 60 tablet 2   pantoprazole (PROTONIX) 20 MG tablet Take 1 tablet by mouth daily as needed for heartburn.     simvastatin (ZOCOR) 20 MG tablet Take 20 mg by mouth at bedtime.     sulfamethoxazole-trimethoprim (BACTRIM DS) 800-160 MG tablet Take 1 tablet by mouth 2 (two) times daily. 14 tablet 0   traMADol-acetaminophen (ULTRACET) 37.5-325 MG tablet Take 1 tablet by mouth every 8 (eight) hours as needed.     No current facility-administered medications for this visit.   Facility-Administered Medications Ordered in Other Visits  Medication Dose Route Frequency Provider Last Rate  Last Admin   heparin lock flush 100 unit/mL  500 Units Intravenous Once Jeralyn Ruths, MD       heparin lock flush 100 unit/mL  500 Units Intracatheter PRN Orlie Dakin, Tollie Pizza, MD       heparin lock flush 100 unit/mL  500 Units Intravenous Once Orlie Dakin, Tollie Pizza, MD       heparin lock flush 100 unit/mL  500 Units Intravenous Once Jeralyn Ruths, MD       sodium chloride flush (NS) 0.9 % injection 10 mL  10 mL Intravenous PRN Jeralyn Ruths, MD   10 mL at 12/04/17 0901   sodium chloride flush (NS) 0.9 % injection 10 mL  10 mL Intracatheter PRN Jeralyn Ruths, MD       sodium chloride flush (NS) 0.9 % injection 10 mL  10 mL Intravenous PRN Jeralyn Ruths, MD   10 mL at 04/24/21 0839   sodium chloride flush (NS) 0.9 % injection 10 mL  10 mL Intracatheter PRN Jeralyn Ruths, MD   10 mL at 04/12/23 1419   yttrium-90 injection 22.3 millicurie  22.3 millicurie Intravenous Once Carmina Miller, MD        OBJECTIVE: Vitals:   09/18/23  0838  BP: 113/77  Pulse: (!) 107  Temp: (!) 96.2 F (35.7 C)  SpO2: 97%       Body mass index is 17.47 kg/m.    ECOG FS:0 - Asymptomatic  General: Well-developed, well-nourished, no acute distress. Eyes: Pink conjunctiva, anicteric sclera. HEENT: Normocephalic, moist mucous membranes. Lungs: No audible wheezing or coughing. Heart: Regular rate and rhythm. Abdomen: Soft, nontender, no obvious distention. Musculoskeletal: No edema, cyanosis, or clubbing. Neuro: Alert, answering all questions appropriately. Cranial nerves grossly intact. Skin: No rashes or petechiae noted. Psych: Normal affect.  LAB RESULTS:  Lab Results  Component Value Date   NA 135 09/18/2023   K 4.0 09/18/2023   CL 100 09/18/2023   CO2 25 09/18/2023   GLUCOSE 93 09/18/2023   BUN 9 09/18/2023   CREATININE 0.66 09/18/2023   CALCIUM 9.0 09/18/2023   PROT 6.6 09/18/2023   ALBUMIN 3.2 (L) 09/18/2023   AST 24 09/18/2023   ALT 13 09/18/2023   ALKPHOS 82 09/18/2023   BILITOT 0.3 09/18/2023   GFRNONAA >60 09/18/2023   GFRAA >60 06/28/2020    Lab Results  Component Value Date   WBC 10.3 09/18/2023   NEUTROABS 6.3 09/18/2023   HGB 11.8 (L) 09/18/2023   HCT 37.2 09/18/2023   MCV 101.9 (H) 09/18/2023   PLT 100 (L) 09/18/2023     STUDIES: DG Sacrum/Coccyx  Result Date: 09/10/2023 CLINICAL DATA:  Larey Seat, sacral pain EXAM: SACRUM AND COCCYX - 2+ VIEW COMPARISON:  None Available. FINDINGS: Frontal and lateral views of the sacrum and coccyx are obtained. There is a minimally displaced distal sacral fracture just proximal to the sacrococcygeal junction at the S5 level, with near anatomic alignment. No other acute bony abnormalities. Left hip arthroplasty is partially visualized. Remainder of the bony pelvis is unremarkable. IMPRESSION: 1. Minimally displaced distal sacral fracture just proximal to the sacrococcygeal junction, best seen on the lateral view. Near anatomic alignment. Electronically Signed   By:  Sharlet Salina M.D.   On: 09/10/2023 16:15   DG HIPS BILAT WITH PELVIS 3-4 VIEWS  Result Date: 09/10/2023 CLINICAL DATA:  Hip pain after fall yesterday. EXAM: DG HIP (WITH OR WITHOUT PELVIS) 3-4V BILAT COMPARISON:  None Available. FINDINGS: Status post left total hip  arthroplasty. No fracture or dislocation is noted. Mild osteophyte formation is seen involving right hip. IMPRESSION: No acute abnormality seen. Electronically Signed   By: Lupita Raider M.D.   On: 09/10/2023 16:14    ONCOLOGY HISTORY:  Biopsy confirmed rectal cancer.  MRI completed at [facility] on October 11, 2020 revealed extension of the tumor beyond the wall into the rectovaginal recess with suspected invasion of the vaginal wall posteriorly.  Tumor also appears to involve the internal anal sphincter.  There are also numerous prominent presacral and mesorectal lymph nodes highly suspicious for malignancy.  PET scan results from November 17, 2020 reviewed independently with 3 hypermetabolic right lower lobe lung nodules consistent with pulmonary metastasis.  Patient completed cycle 8 of FOLFOX on Mar 29, 2021.  She then completed cycle 5 of her 5-FU pump on May 26, 2021 and XRT on June 01, 2021.  PET scan results from July 17, 2021 reviewed independently with no evidence of disease other than enlarging right lower lobe lung lesion.  Patient completed SBRT to the right lung lesion on August 22, 2021.  Repeat PET scan on July 25, 2021 revealed minimal residual disease in the rectum and likely resolution of lesions in the lung.    ASSESSMENT: Recurrent stage IV rectal cancer, history of follicular lymphoma.  PLAN:    Recurrent stage IV rectal cancer: See oncology history as above.  PET scan results from Mar 21, 2023 reviewed independently with clear progression of disease in patient's pelvis as well as multiple pulmonary nodules.  Her CEA initially increased to 29.8, but now has trended down to 7.2.  Hospice and  end-of-life care were previously discussed, but patient preferred to pursue treatment.  CT scan results July 13, 2023 reviewed independently with overall improvement of disease, although patient has a left lower lobe lesion that has increased in size.  Delay cycle 11 of treatment today secondary to recent fall as well as UTI.  Patient will instead receive 1 L of fluids.  Return to clinic in 1 week for reconsideration of cycle 11. Will reimage at the conclusion of cycle 12.   Left lower lobe lung lesion: Increasing in size despite improvement of disease elsewhere.  Recent bronchoscopy did not reveal any evidence of malignancy.  Continue to monitor closely. Recurrent follicular lymphoma: CT scan results from December 23, 2019 reviewed independently with no obvious evidence of recurrent or progressive disease.  PET scan results as above consistent with rectal cancer metastasis and no evidence of lymphoma.   Patient received Zevalin on July 30, 2017 with not much therapeutic effect.  She subsequently underwent cycles 5 of R-CHOP chemotherapy with Neulasta support completing on December 12, 2017.  Patient continues to be in complete remission.  Neuropathy: Chronic and unchanged. Pelvic pain: Patient does not complain of this today.  Well-controlled with tramadol. Poor appetite/weight loss: Resolved.  Continue Megace as prescribed. Neutropenia: Resolved.  Continue Udenyca with pump removal. Thrombocytopenia: Patient's platelet count was 100 today.  Delay treatment as above. Hypokalemia: Resolved.  Continue oral potassium supplementation. Coping/adjustment: Patient was previously given a referral to [facility]. Atrial fibrillation: Appreciate cardiology input.  Patient now on amiodarone. Pelvic fracture: No intervention is needed and patient declined referral to orthopedics.  Continue symptomatic treatment. UTI: Patient was given a prescription for Bactrim today.  Patient expressed understanding and  was in agreement with this plan. She also understands that She can call clinic at any time with any questions, concerns, or complaints.  Cancer Staging  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 [facility]) Staging form: Lymphoid Neoplasms, AJCC 6th Edition - Clinical stage from 08/27/2016: Stage IIE - Signed by Jeralyn Ruths, MD on 08/27/2016  Rectal cancer [facility]) Staging form: Colon and Rectum, AJCC 8th Edition - Clinical: Stage IVA Dewaine Oats, Darwin.Staples) - Signed by Jeralyn Ruths, MD on 11/02/2020   Jeralyn Ruths, MD   09/18/2023 1:54 PM

## 2023-09-18 NOTE — Patient Instructions (Signed)
Dehydration, Adult Dehydration is when you lose more fluids from the body than you take in. Vital organs like the kidneys, brain, and heart cannot function without a proper amount of fluids and salt. Any loss of fluids from the body can cause dehydration.  CAUSES   Vomiting.  Diarrhea.  Excessive sweating.  Excessive urine output.  Fever. SYMPTOMS  Mild dehydration  Thirst.  Dry lips.  Slightly dry mouth. Moderate dehydration  Very dry mouth.  Sunken eyes.  Skin does not bounce back quickly when lightly pinched and released.  Dark urine and decreased urine production.  

## 2023-09-19 LAB — CEA: CEA: 9.7 ng/mL — ABNORMAL HIGH (ref 0.0–4.7)

## 2023-09-20 ENCOUNTER — Inpatient Hospital Stay: Payer: 59

## 2023-09-23 ENCOUNTER — Other Ambulatory Visit: Payer: Self-pay | Admitting: Oncology

## 2023-09-25 ENCOUNTER — Inpatient Hospital Stay: Payer: 59

## 2023-09-25 ENCOUNTER — Other Ambulatory Visit: Payer: Self-pay | Admitting: Oncology

## 2023-09-25 ENCOUNTER — Ambulatory Visit: Payer: 59 | Admitting: Oncology

## 2023-09-25 ENCOUNTER — Other Ambulatory Visit: Payer: 59

## 2023-09-25 ENCOUNTER — Ambulatory Visit: Payer: 59

## 2023-09-25 ENCOUNTER — Inpatient Hospital Stay (HOSPITAL_BASED_OUTPATIENT_CLINIC_OR_DEPARTMENT_OTHER): Payer: 59 | Admitting: Oncology

## 2023-09-25 ENCOUNTER — Encounter: Payer: Self-pay | Admitting: Oncology

## 2023-09-25 VITALS — BP 121/71 | HR 92 | Temp 96.4°F | Resp 18

## 2023-09-25 VITALS — BP 116/76 | HR 101 | Temp 96.5°F | Resp 16 | Ht <= 58 in | Wt 82.5 lb

## 2023-09-25 DIAGNOSIS — C20 Malignant neoplasm of rectum: Secondary | ICD-10-CM

## 2023-09-25 DIAGNOSIS — Z5111 Encounter for antineoplastic chemotherapy: Secondary | ICD-10-CM | POA: Diagnosis not present

## 2023-09-25 LAB — CMP (CANCER CENTER ONLY)
ALT: 43 U/L (ref 0–44)
AST: 41 U/L (ref 15–41)
Albumin: 3.4 g/dL — ABNORMAL LOW (ref 3.5–5.0)
Alkaline Phosphatase: 69 U/L (ref 38–126)
Anion gap: 11 (ref 5–15)
BUN: 15 mg/dL (ref 8–23)
CO2: 22 mmol/L (ref 22–32)
Calcium: 9.3 mg/dL (ref 8.9–10.3)
Chloride: 104 mmol/L (ref 98–111)
Creatinine: 0.88 mg/dL (ref 0.44–1.00)
GFR, Estimated: >60 mL/min (ref >60)
Glucose, Bld: 112 mg/dL — ABNORMAL HIGH (ref 70–99)
Potassium: 4.5 mmol/L (ref 3.5–5.1)
Sodium: 137 mmol/L (ref 135–145)
Total Bilirubin: 0.4 mg/dL (ref <1.2)
Total Protein: 6.6 g/dL (ref 6.5–8.1)

## 2023-09-25 LAB — CBC WITH DIFFERENTIAL (CANCER CENTER ONLY)
Abs Immature Granulocytes: 0.08 10*3/uL — ABNORMAL HIGH (ref 0.00–0.07)
Basophils Absolute: 0 10*3/uL (ref 0.0–0.1)
Basophils Relative: 0 %
Eosinophils Absolute: 0 10*3/uL (ref 0.0–0.5)
Eosinophils Relative: 0 %
HCT: 36 % (ref 36.0–46.0)
Hemoglobin: 11.4 g/dL — ABNORMAL LOW (ref 12.0–15.0)
Immature Granulocytes: 1 %
Lymphocytes Relative: 13 %
Lymphs Abs: 1.3 10*3/uL (ref 0.7–4.0)
MCH: 32.8 pg (ref 26.0–34.0)
MCHC: 31.7 g/dL (ref 30.0–36.0)
MCV: 103.4 fL — ABNORMAL HIGH (ref 80.0–100.0)
Monocytes Absolute: 1.4 10*3/uL — ABNORMAL HIGH (ref 0.1–1.0)
Monocytes Relative: 13 %
Neutro Abs: 7.5 10*3/uL (ref 1.7–7.7)
Neutrophils Relative %: 73 %
Platelet Count: 189 10*3/uL (ref 150–400)
RBC: 3.48 MIL/uL — ABNORMAL LOW (ref 3.87–5.11)
RDW: 18.1 % — ABNORMAL HIGH (ref 11.5–15.5)
WBC Count: 10.3 10*3/uL (ref 4.0–10.5)
nRBC: 0 % (ref 0.0–0.2)

## 2023-09-25 MED ORDER — FLUOROURACIL CHEMO INJECTION 5 GM/100ML
2400.0000 mg/m2 | INTRAVENOUS | Status: DC
  Administered 2023-09-25: 3000 mg via INTRAVENOUS
  Filled 2023-09-25: qty 60

## 2023-09-25 MED ORDER — DEXAMETHASONE SODIUM PHOSPHATE 10 MG/ML IJ SOLN
10.0000 mg | Freq: Once | INTRAMUSCULAR | Status: AC
  Administered 2023-09-25: 10 mg via INTRAVENOUS
  Filled 2023-09-25: qty 1

## 2023-09-25 MED ORDER — DIPHENHYDRAMINE HCL 50 MG/ML IJ SOLN
25.0000 mg | Freq: Once | INTRAMUSCULAR | Status: AC
  Administered 2023-09-25: 25 mg via INTRAVENOUS
  Filled 2023-09-25: qty 1

## 2023-09-25 MED ORDER — SODIUM CHLORIDE 0.9% FLUSH
10.0000 mL | Freq: Once | INTRAVENOUS | Status: AC
  Administered 2023-09-25: 10 mL via INTRAVENOUS
  Filled 2023-09-25: qty 10

## 2023-09-25 MED ORDER — SODIUM CHLORIDE 0.9 % IV SOLN
Freq: Once | INTRAVENOUS | Status: AC
  Filled 2023-09-25: qty 250

## 2023-09-25 MED ORDER — HEPARIN SOD (PORK) LOCK FLUSH 100 UNIT/ML IV SOLN
500.0000 [IU] | Freq: Once | INTRAVENOUS | Status: DC
  Filled 2023-09-25: qty 5

## 2023-09-25 MED ORDER — PALONOSETRON HCL INJECTION 0.25 MG/5ML
0.2500 mg | Freq: Once | INTRAVENOUS | Status: AC
  Administered 2023-09-25: 0.25 mg via INTRAVENOUS
  Filled 2023-09-25: qty 5

## 2023-09-25 MED ORDER — LEUCOVORIN CALCIUM INJECTION 350 MG
400.0000 mg/m2 | Freq: Once | INTRAVENOUS | Status: AC
  Administered 2023-09-25: 496 mg via INTRAVENOUS
  Filled 2023-09-25: qty 24.8

## 2023-09-25 MED ORDER — DEXTROSE 5 % IV SOLN
Freq: Once | INTRAVENOUS | Status: AC
  Filled 2023-09-25: qty 250

## 2023-09-25 MED ORDER — FAMOTIDINE IN NACL 20-0.9 MG/50ML-% IV SOLN
20.0000 mg | Freq: Once | INTRAVENOUS | Status: AC
  Administered 2023-09-25: 20 mg via INTRAVENOUS
  Filled 2023-09-25: qty 50

## 2023-09-25 MED ORDER — DEXTROSE 5 % IV SOLN
85.0000 mg/m2 | Freq: Once | INTRAVENOUS | Status: AC
  Administered 2023-09-25: 100 mg via INTRAVENOUS
  Filled 2023-09-25: qty 20

## 2023-09-25 MED ORDER — FLUOROURACIL CHEMO INJECTION 500 MG/10ML
400.0000 mg/m2 | Freq: Once | INTRAVENOUS | Status: AC
  Administered 2023-09-25: 500 mg via INTRAVENOUS
  Filled 2023-09-25: qty 10

## 2023-09-25 MED ORDER — SODIUM CHLORIDE 0.9 % IV SOLN
5.0000 mg/kg | Freq: Once | INTRAVENOUS | Status: AC
  Administered 2023-09-25: 200 mg via INTRAVENOUS
  Filled 2023-09-25: qty 8

## 2023-09-25 NOTE — Progress Notes (Signed)
[facility]  [phone] [phone]  ID: Veronica Boyd OB: 1945/12/06  MR#: [id number]  VQQ#:[id number]  Patient Care Team: Barbette Reichmann, MD as PCP - General (Internal Medicine) Antonieta Iba, MD as PCP - Cardiology (Cardiology) Nobie Putnam, MD as PCP - Electrophysiology (Cardiology) Jeralyn Ruths, MD as Consulting Physician (Oncology) Benita Gutter, RN as Oncology Nurse Navigator Salena Saner, MD as Consulting Physician (Pulmonary Disease)  CHIEF COMPLAINT: Recurrent stage IV rectal cancer, history of follicular lymphoma.  INTERVAL HISTORY: Patient returns to clinic today for further evaluation and reconsideration of cycle 11 of FOLFOX plus Avastin.  Her UTI symptoms have resolved and she feels nearly back to her baseline.  She does admit to more pelvic pain from her fracture than she had last week.  She continues to have chronic weakness and fatigue.  She is tolerating her treatments well without significant side effects.  Her appetite has improved with Megace and she is maintaining her weight. She has a chronic peripheral neuropathy, but no other neurologic complaints.  She denies any recent fevers or illnesses. She denies any nausea, vomiting, constipation or diarrhea.  She has no urinary complaints.  Patient offers no further specific complaints today.  REVIEW OF SYSTEMS:   Review of Systems  Constitutional:  Positive for malaise/fatigue. Negative for fever and weight loss.  Respiratory: Negative.  Negative for cough, hemoptysis and shortness of breath.   Cardiovascular: Negative.  Negative for chest pain, palpitations and leg swelling.  Gastrointestinal:  Negative for abdominal pain, blood in stool, diarrhea, melena, nausea and vomiting.  Genitourinary: Negative.  Negative for dysuria.  Musculoskeletal:  Positive for joint pain. Negative for back pain and neck pain.  Skin: Negative.  Negative for rash.   Neurological:  Positive for tingling and sensory change. Negative for focal weakness, weakness and headaches.  Psychiatric/Behavioral: Negative.  The patient is not nervous/anxious and does not have insomnia.     As per HPI. Otherwise, a complete review of systems is negative.   PAST MEDICAL HISTORY: Past Medical History:  Diagnosis Date   Anemia due to antineoplastic chemotherapy    Aortic atherosclerosis (HCC)    Arthritis    Atrial fibrillation and flutter (HCC)    a.) 04/2023 Zio: Monitor 1 - 16 % aflutter burden w/ avg rate of 168, (78-226), possible Atach. Freq PACs, rare PVCs. Monitor 2 - 15% afib/flutter burden @ 115 [phone]). Poss Atach. 4 beats NSVT. Freq PACs, rare PVCs; b.)  CHA2DS2-VASc = 4 (age x2, sex, vascular disease history); c.) cardiac rate/rhythm maintained on oral metoprolol succinate; chronically anticoagulated using apixaban   Bronchial obstruction    Chemotherapy-induced neuropathy (HCC)    Chicken pox    Colon polyp    Coronary artery calcification seen on CT scan    a. 11/2022 CT Chest: Coronary artery calcification and aortic atherosclerosis.   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 04/2023 Echo: EF >55%, nl RV size/fxn.  No significant valvular disease observed.   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, cN2a, Darwin.Staples)  Thrombocytopenia (HCC)     PAST SURGICAL HISTORY: Past Surgical History:  Procedure Laterality Date   AXILLARY LYMPH NODE DISSECTION Right 08/21/2016   Procedure: AXILLARY LYMPH NODE excision;  Surgeon: Nadeen Landau, MD;  Location: [facility] ORS;  Service: General;  Laterality: Right;    CATARACT EXTRACTION, BILATERAL Bilateral    COLONOSCOPY N/A 09/19/2020   Procedure: COLONOSCOPY;  Surgeon: Regis Bill, MD;  Location: [facility] ENDOSCOPY;  Service: Endoscopy;  Laterality: N/A;   ENDOBRONCHIAL ULTRASOUND Left 08/05/2023   Procedure: ENDOBRONCHIAL ULTRASOUND;  Surgeon: Salena Saner, MD;  Location: [facility] ORS;  Service: Pulmonary;  Laterality: Left;   PORTA CATH INSERTION N/A 09/16/2017   Procedure: PORTA CATH INSERTION;  Surgeon: Annice Needy, MD;  Location: [facility] INVASIVE CV LAB;  Service: Cardiovascular;  Laterality: N/A;   TONSILLECTOMY     TOTAL HIP ARTHROPLASTY Left 1992    FAMILY HISTORY: Family History  Problem Relation Age of Onset   Heart failure Mother    Emphysema Father    Diabetes Sister    Lung cancer Brother    Diabetes Brother    Breast cancer Paternal Aunt    Basal cell carcinoma Daughter     ADVANCED DIRECTIVES (Y/N):  N  HEALTH MAINTENANCE: Social History   Tobacco Use   Smoking status: Never   Smokeless tobacco: Never  Vaping Use   Vaping status: Never Used  Substance Use Topics   Alcohol use: No   Drug use: No     Colonoscopy:  PAP:  Bone density:  Lipid panel:  No Known Allergies  Current Outpatient Medications  Medication Sig Dispense Refill   amiodarone (PACERONE) 200 MG tablet Take 1 tablet (200 mg) by mouth twice daily for 2 weeks, then decrease to 1 tablet (200 mg) by mouth daily. 90 tablet 3   apixaban (ELIQUIS) 5 MG TABS tablet Take 1 tablet (5 mg total) by mouth 2 (two) times daily. 180 tablet 3   diclofenac sodium (VOLTAREN) 1 % GEL Apply 2 g topically 2 (two) times daily as needed.     diphenoxylate-atropine (LOMOTIL) 2.5-0.025 MG tablet TAKE 2 TABLETS BY MOUTH 4 (FOUR) TIMES DAILY AS NEEDED FOR DIARRHEA OR LOOSE STOOLS. 60 tablet 1   gabapentin (NEURONTIN) 300 MG capsule TAKE 1 CAPSULE BY MOUTH TWICE A DAY 60 capsule 2   KLOR-CON M20 20 MEQ tablet TAKE 1 TABLET BY MOUTH TWICE A DAY 180 tablet 1    lidocaine-prilocaine (EMLA) cream Apply 1 Application topically as needed. Apply to port 1 hour prior to use as needed 30 g 2   ondansetron (ZOFRAN) 8 MG tablet Take 1 tablet (8 mg total) by mouth every 8 (eight) hours as needed for nausea or vomiting. Start on the third day after chemotherapy. 60 tablet 2   pantoprazole (PROTONIX) 20 MG tablet Take 1 tablet by mouth daily as needed for heartburn.     simvastatin (ZOCOR) 20 MG tablet Take 20 mg by mouth at bedtime.     traMADol-acetaminophen (ULTRACET) 37.5-325 MG tablet Take 1 tablet by mouth every 8 (eight) hours as needed.     megestrol (MEGACE) 40 MG tablet TAKE 1 TABLET BY MOUTH EVERY DAY 90 tablet 1   sulfamethoxazole-trimethoprim (BACTRIM DS) 800-160 MG tablet Take 1 tablet by mouth 2 (two) times daily. (Patient not taking: Reported on 09/25/2023) 14 tablet 0   No current facility-administered medications for this visit.   Facility-Administered Medications Ordered in Other Visits  Medication Dose Route Frequency Provider Last Rate  Last Admin   heparin lock flush 100 unit/mL  500 Units Intravenous Once Jeralyn Ruths, MD       heparin lock flush 100 unit/mL  500 Units Intracatheter PRN Orlie Dakin, Tollie Pizza, MD       heparin lock flush 100 unit/mL  500 Units Intravenous Once Orlie Dakin, Tollie Pizza, MD       heparin lock flush 100 unit/mL  500 Units Intravenous Once Orlie Dakin, Tollie Pizza, MD       heparin lock flush 100 unit/mL  500 Units Intravenous Once Jeralyn Ruths, MD       sodium chloride flush (NS) 0.9 % injection 10 mL  10 mL Intravenous PRN Jeralyn Ruths, MD   10 mL at 12/04/17 0901   sodium chloride flush (NS) 0.9 % injection 10 mL  10 mL Intracatheter PRN Jeralyn Ruths, MD       sodium chloride flush (NS) 0.9 % injection 10 mL  10 mL Intravenous PRN Jeralyn Ruths, MD   10 mL at 04/24/21 0839   sodium chloride flush (NS) 0.9 % injection 10 mL  10 mL Intracatheter PRN Jeralyn Ruths, MD   10 mL at 04/12/23  1419   yttrium-90 injection 22.3 millicurie  22.3 millicurie Intravenous Once Chrystal, Sherrine Maples, MD        OBJECTIVE: Vitals:   09/25/23 1051  BP: 116/76  Pulse: (!) 101  Resp: 16  Temp: (!) 96.5 F (35.8 C)  SpO2: 98%       Body mass index is 17.24 kg/m.    ECOG FS:2 - Symptomatic, <50% confined to bed  General: Well-developed, well-nourished, no acute distress.  Sitting in a wheelchair. Eyes: Pink conjunctiva, anicteric sclera. HEENT: Normocephalic, moist mucous membranes. Lungs: No audible wheezing or coughing. Heart: Regular rate and rhythm. Abdomen: Soft, nontender, no obvious distention. Musculoskeletal: No edema, cyanosis, or clubbing. Neuro: Alert, answering all questions appropriately. Cranial nerves grossly intact. Skin: No rashes or petechiae noted. Psych: Normal affect.  LAB RESULTS:  Lab Results  Component Value Date   NA 137 09/25/2023   K 4.5 09/25/2023   CL 104 09/25/2023   CO2 22 09/25/2023   GLUCOSE 112 (H) 09/25/2023   BUN 15 09/25/2023   CREATININE 0.88 09/25/2023   CALCIUM 9.3 09/25/2023   PROT 6.6 09/25/2023   ALBUMIN 3.4 (L) 09/25/2023   AST 41 09/25/2023   ALT 43 09/25/2023   ALKPHOS 69 09/25/2023   BILITOT 0.4 09/25/2023   GFRNONAA >60 09/25/2023   GFRAA >60 06/28/2020    Lab Results  Component Value Date   WBC 10.3 09/25/2023   NEUTROABS 7.5 09/25/2023   HGB 11.4 (L) 09/25/2023   HCT 36.0 09/25/2023   MCV 103.4 (H) 09/25/2023   PLT 189 09/25/2023     STUDIES: DG Sacrum/Coccyx  Result Date: 09/10/2023 CLINICAL DATA:  Larey Seat, sacral pain EXAM: SACRUM AND COCCYX - 2+ VIEW COMPARISON:  None Available. FINDINGS: Frontal and lateral views of the sacrum and coccyx are obtained. There is a minimally displaced distal sacral fracture just proximal to the sacrococcygeal junction at the S5 level, with near anatomic alignment. No other acute bony abnormalities. Left hip arthroplasty is partially visualized. Remainder of the bony pelvis is  unremarkable. IMPRESSION: 1. Minimally displaced distal sacral fracture just proximal to the sacrococcygeal junction, best seen on the lateral view. Near anatomic alignment. Electronically Signed   By: Sharlet Salina M.D.   On: 09/10/2023 16:15   DG HIPS BILAT WITH PELVIS 3-4  VIEWS  Result Date: 09/10/2023 CLINICAL DATA:  Hip pain after fall yesterday. EXAM: DG HIP (WITH OR WITHOUT PELVIS) 3-4V BILAT COMPARISON:  None Available. FINDINGS: Status post left total hip arthroplasty. No fracture or dislocation is noted. Mild osteophyte formation is seen involving right hip. IMPRESSION: No acute abnormality seen. Electronically Signed   By: Lupita Raider M.D.   On: 09/10/2023 16:14    ONCOLOGY HISTORY:  Biopsy confirmed rectal cancer.  MRI completed at [facility] on October 11, 2020 revealed extension of the tumor beyond the wall into the rectovaginal recess with suspected invasion of the vaginal wall posteriorly.  Tumor also appears to involve the internal anal sphincter.  There are also numerous prominent presacral and mesorectal lymph nodes highly suspicious for malignancy.  PET scan results from November 17, 2020 reviewed independently with 3 hypermetabolic right lower lobe lung nodules consistent with pulmonary metastasis.  Patient completed cycle 8 of FOLFOX on Mar 29, 2021.  She then completed cycle 5 of her 5-FU pump on May 26, 2021 and XRT on June 01, 2021.  PET scan results from July 17, 2021 reviewed independently with no evidence of disease other than enlarging right lower lobe lung lesion.  Patient completed SBRT to the right lung lesion on August 22, 2021.  Repeat PET scan on July 25, 2021 revealed minimal residual disease in the rectum and likely resolution of lesions in the lung.    ASSESSMENT: Recurrent stage IV rectal cancer, history of follicular lymphoma.  PLAN:    Recurrent stage IV rectal cancer: See oncology history as above.  PET scan results from Mar 21, 2023 reviewed  independently with clear progression of disease in patient's pelvis as well as multiple pulmonary nodules.  Her CEA initially increased to 29.8, but now has trended down to 7.2.  Her most recent CEA increased slightly to 9.7.  Hospice and end-of-life care were previously discussed, but patient preferred to pursue treatment.  CT scan results July 13, 2023 reviewed independently with overall improvement of disease, although patient has a left lower lobe lesion that has increased in size.  Proceed with cycle 11 of treatment today.  Return to clinic in 2 days for pump removal and then in 2 weeks for further evaluation and consideration of cycle 12.  Will reimage at the conclusion of cycle 12.   Left lower lobe lung lesion: Increasing in size despite improvement of disease elsewhere.  Recent bronchoscopy did not reveal any evidence of malignancy.  Continue to monitor closely. Recurrent follicular lymphoma: CT scan results from December 23, 2019 reviewed independently with no obvious evidence of recurrent or progressive disease.  PET scan results as above consistent with rectal cancer metastasis and no evidence of lymphoma.   Patient received Zevalin on July 30, 2017 with not much therapeutic effect.  She subsequently underwent cycles 5 of R-CHOP chemotherapy with Neulasta support completing on December 12, 2017.  Patient continues to be in complete remission.  Neuropathy: Chronic and unchanged. Pelvic pain: Patient states her pain is slightly worse, but admits.  May be tramadol regularly.   Poor appetite/weight loss: Resolved.  Continue Megace as prescribed. Neutropenia: Resolved.  Continue Udenyca with pump removal. Thrombocytopenia: Resolved.   Hypokalemia: Resolved.  Continue oral potassium supplementation. Coping/adjustment: Patient was previously given a referral to [facility]. Atrial fibrillation: Appreciate cardiology input.  Patient now on amiodarone. Pelvic fracture: No interval needed at  this time.  Continue symptomatic treatment with tramadol as above. UTI: Resolved.  Patient expressed  understanding and was in agreement with this plan. She also understands that She can call clinic at any time with any questions, concerns, or complaints.    Cancer Staging  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 [facility]) Staging form: Lymphoid Neoplasms, AJCC 6th Edition - Clinical stage from 08/27/2016: Stage IIE - Signed by Jeralyn Ruths, MD on 08/27/2016  Rectal cancer [facility]) Staging form: Colon and Rectum, AJCC 8th Edition - Clinical: Stage IVA Dewaine Oats, Darwin.Staples) - Signed by Jeralyn Ruths, MD on 11/02/2020   Jeralyn Ruths, MD   09/25/2023 11:35 AM

## 2023-09-26 LAB — CEA: CEA: 10.9 ng/mL — ABNORMAL HIGH (ref 0.0–4.7)

## 2023-09-27 ENCOUNTER — Ambulatory Visit: Payer: 59

## 2023-09-27 ENCOUNTER — Inpatient Hospital Stay: Payer: 59

## 2023-09-27 VITALS — BP 123/68 | HR 92 | Temp 98.2°F

## 2023-09-27 DIAGNOSIS — Z5111 Encounter for antineoplastic chemotherapy: Secondary | ICD-10-CM | POA: Diagnosis not present

## 2023-09-27 DIAGNOSIS — C20 Malignant neoplasm of rectum: Secondary | ICD-10-CM

## 2023-09-27 MED ORDER — HEPARIN SOD (PORK) LOCK FLUSH 100 UNIT/ML IV SOLN
500.0000 [IU] | Freq: Once | INTRAVENOUS | Status: AC
  Administered 2023-09-27: 500 [IU] via INTRAVENOUS
  Filled 2023-09-27: qty 5

## 2023-09-27 MED ORDER — SODIUM CHLORIDE 0.9 % IV SOLN
Freq: Once | INTRAVENOUS | Status: AC
  Filled 2023-09-27: qty 250

## 2023-09-27 MED ORDER — PEGFILGRASTIM INJECTION 6 MG/0.6ML ~~LOC~~
6.0000 mg | PREFILLED_SYRINGE | Freq: Once | SUBCUTANEOUS | Status: AC
Start: 2023-09-27 — End: 2023-09-27
  Administered 2023-09-27: 6 mg via SUBCUTANEOUS
  Filled 2023-09-27: qty 0.6

## 2023-09-27 MED ORDER — SODIUM CHLORIDE 0.9% FLUSH
10.0000 mL | Freq: Once | INTRAVENOUS | Status: AC
  Administered 2023-09-27: 10 mL via INTRAVENOUS
  Filled 2023-09-27: qty 10

## 2023-09-27 MED ORDER — HEPARIN SOD (PORK) LOCK FLUSH 100 UNIT/ML IV SOLN
500.0000 [IU] | Freq: Once | INTRAVENOUS | Status: DC | PRN
Start: 2023-09-27 — End: 2023-09-27
  Filled 2023-09-27: qty 5

## 2023-09-30 ENCOUNTER — Encounter: Payer: Self-pay | Admitting: Oncology

## 2023-10-01 ENCOUNTER — Telehealth: Payer: Self-pay

## 2023-10-01 NOTE — Telephone Encounter (Signed)
FMLA forms for daughter Veronica Boyd completed and faxed to the number listed on FMLA forms. Also faxed to Veronica Boyd at her request [phone])

## 2023-10-08 ENCOUNTER — Ambulatory Visit: Payer: 59 | Admitting: Pulmonary Disease

## 2023-10-08 ENCOUNTER — Encounter: Payer: Self-pay | Admitting: Pulmonary Disease

## 2023-10-08 VITALS — BP 106/70 | HR 100 | Temp 97.6°F | Ht <= 58 in | Wt 84.0 lb

## 2023-10-08 DIAGNOSIS — C7800 Secondary malignant neoplasm of unspecified lung: Secondary | ICD-10-CM | POA: Diagnosis not present

## 2023-10-08 DIAGNOSIS — R0602 Shortness of breath: Secondary | ICD-10-CM

## 2023-10-08 DIAGNOSIS — R053 Chronic cough: Secondary | ICD-10-CM

## 2023-10-08 DIAGNOSIS — C20 Malignant neoplasm of rectum: Secondary | ICD-10-CM | POA: Diagnosis not present

## 2023-10-08 DIAGNOSIS — I480 Paroxysmal atrial fibrillation: Secondary | ICD-10-CM | POA: Diagnosis not present

## 2023-10-08 DIAGNOSIS — C8208 Follicular lymphoma grade I, lymph nodes of multiple sites: Secondary | ICD-10-CM | POA: Diagnosis not present

## 2023-10-08 NOTE — Patient Instructions (Signed)
VISIT SUMMARY:  During today's visit, we discussed your overall health and addressed several specific concerns. You mentioned feeling generally well but experiencing a persistent cough, a recent bladder infection, and some mobility issues. We reviewed your current treatment plan and made some adjustments to help manage your symptoms and improve your quality of life.  YOUR PLAN:  -COUGH: Your intermittent cough, which worsens at night, is likely due to reflux and nasal drainage. To help alleviate this, we recommend elevating the head of your bed during sleep. Using an adjustable bed can help maintain this elevated position. Additionally, avoid allowing pets on the bed to reduce allergens.  -BLADDER INFECTION: You recently had a bladder infection that delayed your chemotherapy treatment. It's important to monitor for any recurrence of symptoms and ensure you follow up with your oncologist to keep your treatment on track.  -GENERAL HEALTH MAINTENANCE: You have not received a flu shot due to your dislike of needles. Despite this, getting vaccinated is important for your health. We also discussed your limited mobility and the use of a walker to prevent falls. Continuing to use the walker will help support your mobility.  INSTRUCTIONS:  Please monitor your symptoms and follow up with your oncologist as needed. We will provide a printout of your blood test results for your next oncologist visit. Schedule a follow-up visit with [place] in three months unless new issues arise.

## 2023-10-08 NOTE — Progress Notes (Signed)
Subjective:    Patient ID: Veronica [facility], female    DOB: 1946-08-08, 77 y.o.   MRN: [id number]  Patient Care Team: Barbette Reichmann, MD as PCP - General (Internal Medicine) Antonieta Iba, MD as PCP - Cardiology (Cardiology) Nobie Putnam, MD as PCP - Electrophysiology (Cardiology) Jeralyn Ruths, MD as Consulting Physician (Oncology) Benita Gutter, RN as Oncology Nurse Navigator Salena Saner, MD as Consulting Physician (Pulmonary Disease)  Chief Complaint  Patient presents with   Follow-up    Shortness of breath on exertion. Occasional cough. No wheezing.     BACKGROUND/INTERVAL: Veronica Boyd is a 77 year old lifelong never smoker who follows up for pulmonary nodules in the setting of stage IV rectal cancer and a history of follicular lymphoma.  She also has a chronic cough.  We last saw the patient on 20 August 2023.  This is a scheduled follow-up visit.  HPI Discussed the use of AI scribe software for clinical note transcription with the patient, who gave verbal consent to proceed.  History of Present Illness   The patient, currently undergoing chemotherapy for stage IV colorectal cancer, reports feeling "pretty good" overall.  He has been following her here for lung nodules due to metastatic disease and for a cough.  She has been experiencing a persistent cough, which worsens at night. The patient suspects that this may be due to the position of her bed and is considering raising it to alleviate the symptoms. She has a history of reflux and bronchoscopy results have previously indicated issues consistent with aspiration.  At her prior visit she was given a short course of prednisone and Qvar inhaler she noted that this helped with her cough.  Is currently not using the inhaler, however.  In addition to the cough, the patient has been dealing with a recent bladder infection, which led to a delay in her chemotherapy treatment. The infection occurred a week  prior to her last chemotherapy session, which was on the 20th.  The patient also reports difficulty with mobility, having fallen while attempting to operate a dryer. As a result, she has started using a walker at home to assist with movement. Despite these health issues, the patient remains active at home, engaging in activities such as caring for her pets.  The patient's medication regimen includes chemotherapy, the specifics of which are detailed on her oncology notes. She has not received a flu shot and expresses a dislike for needles. The patient also mentions that she no longer drives due to a lack of strength.     Review of Systems A 10 point review of systems was performed and it is as noted above otherwise negative.   Patient Active Problem List   Diagnosis Date Noted   Paroxysmal atrial fibrillation (HCC) 07/02/2023   Hypercoagulable state due to paroxysmal atrial fibrillation (HCC) 07/02/2023   Lung nodule 10/05/2022   Bronchial obstruction 10/05/2022   Chemotherapy-induced neuropathy (HCC) 07/21/2021   Rectal cancer (HCC) 11/02/2020   Rectal mass 10/11/2020   Gastroesophageal reflux disease without esophagitis 08/25/2018   Protein-calorie malnutrition, severe 12/20/2017   Anemia    Thrombocytopenia (HCC)    Sepsis (HCC) 12/19/2017   Anemia due to antineoplastic chemotherapy 12/17/2017   Goals of care, counseling/discussion 09/19/2017   Axillary mass, right 04/03/2017   Pure hypercholesterolemia 09/10/2016   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) 07/24/2016   Mass of right chest wall 06/06/2016  Primary osteoarthritis involving multiple joints 12/14/2015   Transient insomnia 08/27/2015   Mixed hyperlipidemia 03/01/2015   Nocturnal muscle cramps 03/01/2015   Osteoporosis 11/08/2014   Primary osteoarthritis of both knees 07/05/2014   Hyperlipidemia 03/19/2014   Osteoarthritis of knee 03/19/2014    Abnormal mammogram 03/03/2013   Vitamin D deficiency 05/09/2012    Social History   Tobacco Use   Smoking status: Never   Smokeless tobacco: Never  Substance Use Topics   Alcohol use: No    No Known Allergies  Current Meds  Medication Sig   amiodarone (PACERONE) 200 MG tablet Take 1 tablet (200 mg) by mouth twice daily for 2 weeks, then decrease to 1 tablet (200 mg) by mouth daily.   apixaban (ELIQUIS) 5 MG TABS tablet Take 1 tablet (5 mg total) by mouth 2 (two) times daily.   diclofenac sodium (VOLTAREN) 1 % GEL Apply 2 g topically 2 (two) times daily as needed.   diphenoxylate-atropine (LOMOTIL) 2.5-0.025 MG tablet TAKE 2 TABLETS BY MOUTH 4 (FOUR) TIMES DAILY AS NEEDED FOR DIARRHEA OR LOOSE STOOLS.   gabapentin (NEURONTIN) 300 MG capsule TAKE 1 CAPSULE BY MOUTH TWICE A DAY   KLOR-CON M20 20 MEQ tablet TAKE 1 TABLET BY MOUTH TWICE A DAY   lidocaine-prilocaine (EMLA) cream Apply 1 Application topically as needed. Apply to port 1 hour prior to use as needed   megestrol (MEGACE) 40 MG tablet TAKE 1 TABLET BY MOUTH EVERY DAY   ondansetron (ZOFRAN) 8 MG tablet Take 1 tablet (8 mg total) by mouth every 8 (eight) hours as needed for nausea or vomiting. Start on the third day after chemotherapy.   pantoprazole (PROTONIX) 20 MG tablet Take 1 tablet by mouth daily as needed for heartburn.   simvastatin (ZOCOR) 20 MG tablet Take 20 mg by mouth at bedtime.   traMADol-acetaminophen (ULTRACET) 37.5-325 MG tablet Take 1 tablet by mouth every 8 (eight) hours as needed.    Immunization History  Administered Date(s) Administered   Influenza-Unspecified 09/20/2014   PFIZER(Purple Top)SARS-COV-2 Vaccination 01/07/2020, 02/02/2020   Unspecified SARS-COV-2 Vaccination 02/02/2020   Zoster Recombinant(Shingrix) 11/29/2019, 03/20/2020   Zoster, Live 09/20/2014        Objective:     BP 106/70 (BP Location: Left Arm, Patient Position: Sitting, Cuff Size: Normal)   Pulse 100   Temp 97.6 F (36.4  C) (Temporal)   Ht 4\' 10"  (1.473 m)   Wt 84 lb (38.1 kg)   SpO2 96%   BMI 17.56 kg/m   SpO2: 96 %  GENERAL: Thin, elderly female, pale, presents in transport chair.  No conversational dyspnea. HEAD: Normocephalic, atraumatic.  EYES: Pupils equal, round, reactive to light.  No scleral icterus.  MOUTH: Wearing mask today. NECK: Supple. No thyromegaly. Trachea midline. No JVD.  No adenopathy. PULMONARY: Pectus excavatum, good air entry bilaterally.  No adventitious sounds. CARDIOVASCULAR: S1 and S2.  Regular rate and rhythm.  No rubs, murmurs or gallops heard. ABDOMEN: Scaphoid otherwise benign. MUSCULOSKELETAL: No joint deformity, no clubbing, no edema.  NEUROLOGIC: Grossly nonfocal.  Fully ambulatory.  Speech is fluent. SKIN: Intact,warm,dry. PSYCH: Mood and behavior normal.   Assessment & Plan:     ICD-10-CM   1. 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 Paroxysmal atrial fibrillation (HCC)  I48.0     3. 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 SOB (shortness of breath)  R06.02     5. Chronic cough  R05.3      Discussion:    Cough Intermittent cough, worse at night, likely due to reflux and nasal drainage. Previous bronchoscopy indicated aspiration. Discussed elevating the head of the bed to reduce reflux and nasal drainage. Patient prefers using an adjustable bed. - Elevate head of bed during sleep - Use adjustable bed to maintain elevated position - Avoid allowing pets on the bed  Bladder Infection Recent bladder infection delayed chemotherapy. Infection occurred the week before the last chemotherapy session, necessitating a delay in treatment. - Monitor for recurrence of symptoms - Ensure follow-up with oncologist  General Health Maintenance Patient has not received the flu shot due to needle aversion. Limited mobility and use of a walker due to weakness and fall risk. Discussed the importance of  flu vaccination despite needle aversion. - Encourage flu vaccination if possible - Continue using walker for mobility support  Follow-up - Provided printout of blood test results ([facility]cul[facility]) for next oncologist visit - Schedule follow-up visit in three months unless new issues arise.      Gailen Shelter, MD Advanced Bronchoscopy PCCM [facility]    *This note was generated using voice recognition software/Dragon and/or AI transcription program.  Despite best efforts to proofread, errors can occur which can change the meaning. Any transcriptional errors that result from this process are unintentional and may not be fully corrected at the time of dictation.

## 2023-10-09 ENCOUNTER — Ambulatory Visit: Payer: 59

## 2023-10-09 ENCOUNTER — Other Ambulatory Visit: Payer: Self-pay

## 2023-10-09 ENCOUNTER — Ambulatory Visit: Payer: 59 | Admitting: Oncology

## 2023-10-09 ENCOUNTER — Other Ambulatory Visit: Payer: 59

## 2023-10-11 ENCOUNTER — Ambulatory Visit: Payer: 59

## 2023-10-16 ENCOUNTER — Inpatient Hospital Stay (HOSPITAL_BASED_OUTPATIENT_CLINIC_OR_DEPARTMENT_OTHER): Payer: 59 | Admitting: Oncology

## 2023-10-16 ENCOUNTER — Inpatient Hospital Stay: Payer: 59

## 2023-10-16 ENCOUNTER — Encounter: Payer: Self-pay | Admitting: Pulmonary Disease

## 2023-10-16 ENCOUNTER — Inpatient Hospital Stay: Payer: 59 | Attending: Oncology

## 2023-10-16 ENCOUNTER — Encounter: Payer: Self-pay | Admitting: Oncology

## 2023-10-16 VITALS — BP 117/65 | HR 95 | Temp 96.9°F | Resp 16 | Ht <= 58 in | Wt 84.9 lb

## 2023-10-16 DIAGNOSIS — C7801 Secondary malignant neoplasm of right lung: Secondary | ICD-10-CM | POA: Insufficient documentation

## 2023-10-16 DIAGNOSIS — Z5189 Encounter for other specified aftercare: Secondary | ICD-10-CM | POA: Diagnosis not present

## 2023-10-16 DIAGNOSIS — Z5111 Encounter for antineoplastic chemotherapy: Secondary | ICD-10-CM | POA: Diagnosis present

## 2023-10-16 DIAGNOSIS — C20 Malignant neoplasm of rectum: Secondary | ICD-10-CM

## 2023-10-16 DIAGNOSIS — Z79899 Other long term (current) drug therapy: Secondary | ICD-10-CM | POA: Insufficient documentation

## 2023-10-16 LAB — CMP (CANCER CENTER ONLY)
ALT: 12 U/L (ref 0–44)
AST: 20 U/L (ref 15–41)
Albumin: 3.4 g/dL — ABNORMAL LOW (ref 3.5–5.0)
Alkaline Phosphatase: 63 U/L (ref 38–126)
Anion gap: 7 (ref 5–15)
BUN: 14 mg/dL (ref 8–23)
CO2: 24 mmol/L (ref 22–32)
Calcium: 9.2 mg/dL (ref 8.9–10.3)
Chloride: 108 mmol/L (ref 98–111)
Creatinine: 0.73 mg/dL (ref 0.44–1.00)
GFR, Estimated: >60 mL/min (ref >60)
Glucose, Bld: 114 mg/dL — ABNORMAL HIGH (ref 70–99)
Potassium: 3.9 mmol/L (ref 3.5–5.1)
Sodium: 139 mmol/L (ref 135–145)
Total Bilirubin: 0.7 mg/dL (ref <1.2)
Total Protein: 6.3 g/dL — ABNORMAL LOW (ref 6.5–8.1)

## 2023-10-16 LAB — CBC WITH DIFFERENTIAL (CANCER CENTER ONLY)
Abs Immature Granulocytes: 0.04 10*3/uL (ref 0.00–0.07)
Basophils Absolute: 0 10*3/uL (ref 0.0–0.1)
Basophils Relative: 1 %
Eosinophils Absolute: 0.1 10*3/uL (ref 0.0–0.5)
Eosinophils Relative: 1 %
HCT: 36.2 % (ref 36.0–46.0)
Hemoglobin: 11.5 g/dL — ABNORMAL LOW (ref 12.0–15.0)
Immature Granulocytes: 1 %
Lymphocytes Relative: 21 %
Lymphs Abs: 1.7 10*3/uL (ref 0.7–4.0)
MCH: 33.2 pg (ref 26.0–34.0)
MCHC: 31.8 g/dL (ref 30.0–36.0)
MCV: 104.6 fL — ABNORMAL HIGH (ref 80.0–100.0)
Monocytes Absolute: 1.3 10*3/uL — ABNORMAL HIGH (ref 0.1–1.0)
Monocytes Relative: 16 %
Neutro Abs: 5 10*3/uL (ref 1.7–7.7)
Neutrophils Relative %: 60 %
Platelet Count: 166 10*3/uL (ref 150–400)
RBC: 3.46 MIL/uL — ABNORMAL LOW (ref 3.87–5.11)
RDW: 17.4 % — ABNORMAL HIGH (ref 11.5–15.5)
WBC Count: 8 10*3/uL (ref 4.0–10.5)
nRBC: 0 % (ref 0.0–0.2)

## 2023-10-16 MED ORDER — SODIUM CHLORIDE 0.9 % IV SOLN
5.0000 mg/kg | Freq: Once | INTRAVENOUS | Status: AC
  Administered 2023-10-16: 200 mg via INTRAVENOUS
  Filled 2023-10-16: qty 8

## 2023-10-16 MED ORDER — DEXAMETHASONE SODIUM PHOSPHATE 10 MG/ML IJ SOLN
10.0000 mg | Freq: Once | INTRAMUSCULAR | Status: AC
Start: 2023-10-16 — End: 2023-10-16
  Administered 2023-10-16: 10 mg via INTRAVENOUS
  Filled 2023-10-16: qty 1

## 2023-10-16 MED ORDER — OXALIPLATIN CHEMO INJECTION 100 MG/20ML
85.0000 mg/m2 | Freq: Once | INTRAVENOUS | Status: AC
  Administered 2023-10-16: 100 mg via INTRAVENOUS
  Filled 2023-10-16: qty 20

## 2023-10-16 MED ORDER — SODIUM CHLORIDE 0.9 % IV SOLN
Freq: Once | INTRAVENOUS | Status: AC
Start: 2023-10-16 — End: 2023-10-16
  Filled 2023-10-16: qty 250

## 2023-10-16 MED ORDER — DEXTROSE 5 % IV SOLN
Freq: Once | INTRAVENOUS | Status: AC
  Filled 2023-10-16: qty 250

## 2023-10-16 MED ORDER — SODIUM CHLORIDE 0.9 % IV SOLN
2400.0000 mg/m2 | INTRAVENOUS | Status: DC
  Administered 2023-10-16: 3000 mg via INTRAVENOUS
  Filled 2023-10-16: qty 60

## 2023-10-16 MED ORDER — LEUCOVORIN CALCIUM INJECTION 350 MG
400.0000 mg/m2 | Freq: Once | INTRAMUSCULAR | Status: AC
  Administered 2023-10-16: 496 mg via INTRAVENOUS
  Filled 2023-10-16: qty 24.8

## 2023-10-16 MED ORDER — FAMOTIDINE IN NACL 20-0.9 MG/50ML-% IV SOLN
20.0000 mg | Freq: Once | INTRAVENOUS | Status: AC
  Administered 2023-10-16: 20 mg via INTRAVENOUS
  Filled 2023-10-16: qty 50

## 2023-10-16 MED ORDER — DIPHENHYDRAMINE HCL 50 MG/ML IJ SOLN
25.0000 mg | Freq: Once | INTRAMUSCULAR | Status: AC
  Administered 2023-10-16: 25 mg via INTRAVENOUS
  Filled 2023-10-16: qty 1

## 2023-10-16 MED ORDER — FLUOROURACIL CHEMO INJECTION 500 MG/10ML
400.0000 mg/m2 | Freq: Once | INTRAVENOUS | Status: AC
  Administered 2023-10-16: 500 mg via INTRAVENOUS
  Filled 2023-10-16: qty 10

## 2023-10-16 MED ORDER — PALONOSETRON HCL INJECTION 0.25 MG/5ML
0.2500 mg | Freq: Once | INTRAVENOUS | Status: AC
  Administered 2023-10-16: 0.25 mg via INTRAVENOUS
  Filled 2023-10-16: qty 5

## 2023-10-16 NOTE — Progress Notes (Signed)
Veronica Boyd  [phone] [phone]  ID: Veronica Boyd OB: Jun 05, 1946  MR#: [id number]  KGM#:[id number]  Patient Care Team: Barbette Reichmann, MD as PCP - General (Internal Medicine) Antonieta Iba, MD as PCP - Cardiology (Cardiology) Nobie Putnam, MD as PCP - Electrophysiology (Cardiology) Jeralyn Ruths, MD as Consulting Physician (Oncology) Benita Gutter, RN as Oncology Nurse Navigator Salena Saner, MD as Consulting Physician (Pulmonary Disease)  CHIEF COMPLAINT: Recurrent stage IV rectal cancer, history of follicular lymphoma.  INTERVAL HISTORY: Patient returns to clinic today for further evaluation and consideration of cycle 12 of FOLFOX plus Avastin.  She continues to have chronic weakness and fatigue, but otherwise feels well.  She continues to have occasional pain from her recent pelvic fracture.  She is tolerating her treatments well without significant side effects.  Her appetite has improved with Megace and she is maintaining her weight. She has a chronic peripheral neuropathy, but no other neurologic complaints.  She denies any recent fevers or illnesses. She denies any nausea, vomiting, constipation or diarrhea.  She has no urinary complaints.  Patient offers no further specific complaints today.  REVIEW OF SYSTEMS:   Review of Systems  Constitutional:  Positive for malaise/fatigue. Negative for fever and weight loss.  Respiratory: Negative.  Negative for cough, hemoptysis and shortness of breath.   Cardiovascular: Negative.  Negative for chest pain, palpitations and leg swelling.  Gastrointestinal:  Negative for abdominal pain, blood in stool, diarrhea, melena, nausea and vomiting.  Genitourinary: Negative.  Negative for dysuria.  Musculoskeletal:  Positive for joint pain. Negative for back pain and neck pain.  Skin: Negative.  Negative for rash.  Neurological:  Positive for tingling and sensory change. Negative  for focal weakness, weakness and headaches.  Psychiatric/Behavioral: Negative.  The patient is not nervous/anxious and does not have insomnia.     As per HPI. Otherwise, a complete review of systems is negative.   PAST MEDICAL HISTORY: Past Medical History:  Diagnosis Date   Anemia due to antineoplastic chemotherapy    Aortic atherosclerosis (HCC)    Arthritis    Atrial fibrillation and flutter (HCC)    a.) 04/2023 Zio: Monitor 1 - 16 % aflutter burden w/ avg rate of 168, (78-226), possible Atach. Freq PACs, rare PVCs. Monitor 2 - 15% afib/flutter burden @ [phone] Poss Atach. 4 beats NSVT. Freq PACs, rare PVCs; b.)  CHA2DS2-VASc = 4 (age x2, sex, vascular disease history); c.) cardiac rate/rhythm maintained on oral metoprolol succinate; chronically anticoagulated using apixaban   Bronchial obstruction    Chemotherapy-induced neuropathy (HCC)    Chicken pox    Colon polyp    Coronary artery calcification seen on CT scan    a. 11/2022 CT Chest: Coronary artery calcification and aortic atherosclerosis.   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 04/2023 Echo: EF >55%, nl RV size/fxn.  No significant valvular disease observed.   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, cN2a, cM1a)   Thrombocytopenia (HCC)     PAST SURGICAL HISTORY: Past Surgical History:  Procedure Laterality Date   AXILLARY LYMPH NODE DISSECTION Right 08/21/2016   Procedure: AXILLARY LYMPH NODE excision;  Surgeon: Nadeen Landau, MD;  Location: [facility] ORS;  Service: General;  Laterality: Right;   CATARACT EXTRACTION, BILATERAL Bilateral    COLONOSCOPY N/A  09/19/2020   Procedure: COLONOSCOPY;  Surgeon: Regis Bill, MD;  Location: [facility];  Service: Endoscopy;  Laterality: N/A;   ENDOBRONCHIAL ULTRASOUND Left 08/05/2023   Procedure: ENDOBRONCHIAL ULTRASOUND;  Surgeon: Salena Saner, MD;  Location: [facility] ORS;  Service: Pulmonary;  Laterality: Left;   PORTA CATH INSERTION N/A 09/16/2017   Procedure: PORTA CATH INSERTION;  Surgeon: Annice Needy, MD;  Location: [facility] INV[facility]IVE [facility];  Service: Cardiovascular;  Laterality: N/A;   TONSILLECTOMY     TOTAL HIP ARTHROPLASTY Left 1992    FAMILY HISTORY: Family History  Problem Relation Age of Onset   Heart failure Mother    Emphysema Father    Diabetes Sister    Lung cancer Brother    Diabetes Brother    Breast cancer Paternal Aunt    Basal cell carcinoma Daughter     ADVANCED DIRECTIVES (Y/N):  N  HEALTH MAINTENANCE: Social History   Tobacco Use   Smoking status: Never   Smokeless tobacco: Never  Vaping Use   Vaping status: Never Used  Substance Use Topics   Alcohol use: No   Drug use: No     Colonoscopy:  PAP:  Bone density:  Lipid panel:  No Known Allergies  Current Outpatient Medications  Medication Sig Dispense Refill   amiodarone (PACERONE) 200 MG tablet Take 1 tablet (200 mg) by mouth twice daily for 2 weeks, then decrease to 1 tablet (200 mg) by mouth daily. 90 tablet 3   apixaban (ELIQUIS) 5 MG TABS tablet Take 1 tablet (5 mg total) by mouth 2 (two) times daily. 180 tablet 3   diclofenac sodium (VOLTAREN) 1 % GEL Apply 2 g topically 2 (two) times daily as needed.     diphenoxylate-atropine (LOMOTIL) 2.5-0.025 MG tablet TAKE 2 TABLETS BY MOUTH 4 (FOUR) TIMES DAILY AS NEEDED FOR DIARRHEA OR LOOSE STOOLS. 60 tablet 1   gabapentin (NEURONTIN) 300 MG capsule TAKE 1 CAPSULE BY MOUTH TWICE A DAY 60 capsule 2   KLOR-CON M20 20 MEQ tablet TAKE 1 TABLET BY MOUTH TWICE A DAY 180 tablet 1   lidocaine-prilocaine (EMLA) cream Apply 1 Application topically as needed.  Apply to port 1 hour prior to use as needed 30 g 2   megestrol (MEGACE) 40 MG tablet TAKE 1 TABLET BY MOUTH EVERY DAY 90 tablet 1   ondansetron (ZOFRAN) 8 MG tablet Take 1 tablet (8 mg total) by mouth every 8 (eight) hours as needed for nausea or vomiting. Start on the third day after chemotherapy. 60 tablet 2   pantoprazole (PROTONIX) 20 MG tablet Take 1 tablet by mouth daily as needed for heartburn.     simvastatin (ZOCOR) 20 MG tablet Take 20 mg by mouth at bedtime.     traMADol-acetaminophen (ULTRACET) 37.5-325 MG tablet Take 1 tablet by mouth every 8 (eight) hours as needed.     No current facility-administered medications for this visit.   Facility-Administered Medications Ordered in Other Visits  Medication Dose Route Frequency Provider Last Rate Last Admin   bevacizumab-awwb (MVASI) 200 mg in sodium chloride 0.9 % 100 mL chemo infusion  5 mg/kg (Treatment Plan Recorded) Intravenous Once Jeralyn Ruths, MD       dextrose 5 % solution  Intravenous Once Jeralyn Ruths, MD       diphenhydrAMINE (BENADRYL) injection 25 mg  25 mg Intravenous Once Jeralyn Ruths, MD       famotidine (PEPCID) IVPB 20 mg premix  20 mg Intravenous Once Jeralyn Ruths, MD       fluorouracil (ADRUCIL) 3,000 mg in sodium chloride 0.9 % 90 mL chemo infusion  2,400 mg/m2 (Treatment Plan Recorded) Intravenous 1 day or 1 dose Orlie Dakin, Tollie Pizza, MD       fluorouracil (ADRUCIL) chemo injection 500 mg  400 mg/m2 (Treatment Plan Recorded) Intravenous Once Jeralyn Ruths, MD       heparin lock flush 100 unit/mL  500 Units Intravenous Once Jeralyn Ruths, MD       heparin lock flush 100 unit/mL  500 Units Intracatheter PRN Jeralyn Ruths, MD       heparin lock flush 100 unit/mL  500 Units Intravenous Once Jeralyn Ruths, MD       heparin lock flush 100 unit/mL  500 Units Intravenous Once Jeralyn Ruths, MD       leucovorin 496 mg in dextrose 5 % 250 mL infusion  400 mg/m2  (Treatment Plan Recorded) Intravenous Once Jeralyn Ruths, MD       oxaliplatin (ELOXATIN) 100 mg in dextrose 5 % 500 mL chemo infusion  85 mg/m2 (Treatment Plan Recorded) Intravenous Once Jeralyn Ruths, MD       sodium chloride flush (NS) 0.9 % injection 10 mL  10 mL Intravenous PRN Jeralyn Ruths, MD   10 mL at 12/04/17 0901   sodium chloride flush (NS) 0.9 % injection 10 mL  10 mL Intracatheter PRN Jeralyn Ruths, MD       sodium chloride flush (NS) 0.9 % injection 10 mL  10 mL Intravenous PRN Jeralyn Ruths, MD   10 mL at 04/24/21 0839   sodium chloride flush (NS) 0.9 % injection 10 mL  10 mL Intracatheter PRN Jeralyn Ruths, MD   10 mL at 04/12/23 1419   yttrium-90 injection 22.3 millicurie  22.3 millicurie Intravenous Once Chrystal, Sherrine Maples, MD        OBJECTIVE: Vitals:   10/16/23 0839  BP: 117/65  Pulse: 95  Resp: 16  Temp: (!) 96.9 F (36.1 C)  SpO2: 98%       Body mass index is 17.74 kg/m.    ECOG FS:2 - Symptomatic, <50% confined to bed  General: Well-developed, well-nourished, no acute distress.  Sitting in a wheelchair. Eyes: Pink conjunctiva, anicteric sclera. HEENT: Normocephalic, moist mucous membranes. Lungs: No audible wheezing or coughing. Heart: Regular rate and rhythm. Abdomen: Soft, nontender, no obvious distention. Musculoskeletal: No edema, cyanosis, or clubbing. Neuro: Alert, answering all questions appropriately. Cranial nerves grossly intact. Skin: No rashes or petechiae noted. Psych: Normal affect.  LAB RESULTS:  Lab Results  Component Value Date   NA 139 10/16/2023   K 3.9 10/16/2023   CL 108 10/16/2023   CO2 24 10/16/2023   GLUCOSE 114 (H) 10/16/2023   BUN 14 10/16/2023   CREATININE 0.73 10/16/2023   CALCIUM 9.2 10/16/2023   PROT 6.3 (L) 10/16/2023   ALBUMIN 3.4 (L) 10/16/2023   AST 20 10/16/2023   ALT 12 10/16/2023   ALKPHOS 63 10/16/2023   BILITOT 0.7 10/16/2023   GFRNONAA >60 10/16/2023   GFRAA >60  06/28/2020    Lab Results  Component Value Date   WBC 8.0 10/16/2023   NEUTROABS 5.0 10/16/2023  HGB 11.5 (L) 10/16/2023   HCT 36.2 10/16/2023   MCV 104.6 (H) 10/16/2023   PLT 166 10/16/2023     STUDIES: No results found.  ONCOLOGY HISTORY:  Biopsy confirmed rectal cancer.  MRI completed at [facility] on October 11, 2020 revealed extension of the tumor beyond the wall into the rectovaginal recess with suspected invasion of the vaginal wall posteriorly.  Tumor also appears to involve the internal anal sphincter.  There are also numerous prominent presacral and mesorectal lymph nodes highly suspicious for malignancy.  PET scan results from November 17, 2020 reviewed independently with 3 hypermetabolic right lower lobe lung nodules consistent with pulmonary metastasis.  Patient completed cycle 8 of FOLFOX on Mar 29, 2021.  She then completed cycle 5 of her 5-FU pump on May 26, 2021 and XRT on June 01, 2021.  PET scan results from July 17, 2021 reviewed independently with no evidence of disease other than enlarging right lower lobe lung lesion.  Patient completed SBRT to the right lung lesion on August 22, 2021.  Repeat PET scan on July 25, 2021 revealed minimal residual disease in the rectum and likely resolution of lesions in the lung.    ASSESSMENT: Recurrent stage IV rectal cancer, history of follicular lymphoma.  PLAN:    Recurrent stage IV rectal cancer: See oncology history as above.  PET scan results from Mar 21, 2023 reviewed independently with clear progression of disease in patient's pelvis as well as multiple pulmonary nodules.  Her CEA initially increased to 29.8, trended down to 7.2, but now has trended back up slightly to 10.9.  Hospice and end-of-life care were previously discussed, but patient preferred to pursue treatment.  CT scan results July 13, 2023 reviewed independently with overall improvement of disease, although patient has a left lower lobe lesion  that has increased in size.  Proceed with cycle 12 of treatment today.  Return to clinic in 2 days for pump removal.  Patient will then return to clinic in 1 month with repeat imaging using CT scan and further evaluation.     Left lower lobe lung lesion: Increasing in size despite improvement of disease elsewhere.  Recent bronchoscopy did not reveal any evidence of malignancy.  Continue to monitor closely.  Imaging as above.  Circular gene testing revealed positive for PD-L1 expression as well as ATM and CHEK2 mutations.  Given these results, immunotherapy or olaparib could be used in the future. Recurrent follicular lymphoma: CT scan results from December 23, 2019 reviewed independently with no obvious evidence of recurrent or progressive disease.  PET scan results as above consistent with rectal cancer metastasis and no evidence of lymphoma.   Patient received Zevalin on July 30, 2017 with not much therapeutic effect.  She subsequently underwent cycles 5 of R-CHOP chemotherapy with Neulasta support completing on December 12, 2017.  Patient continues to be in complete remission.  Neuropathy: Chronic and unchanged. Pelvic pain: Slightly improved, but patient continues to only use tramadol sparingly.   Poor appetite/weight loss: Resolved.  Continue Megace as prescribed. Neutropenia: Resolved.  Continue Udenyca with pump removal. Thrombocytopenia: Resolved.   Hypokalemia: Resolved.  Continue oral potassium supplementation. Coping/adjustment: Patient was previously given a referral to [facility]. Atrial fibrillation: Appreciate cardiology input.  Patient now on amiodarone. Pelvic fracture: No interval needed at this time.  Continue symptomatic treatment with tramadol as above. UTI: Resolved.  Patient expressed understanding and was in agreement with this plan. She also understands that She can call clinic at  any time with any questions, concerns, or complaints.    Cancer Staging  Grade 1  follicular lymphoma of lymph nodes of multiple regions [facility]) Staging form: Lymphoid Neoplasms, AJCC 6th Edition - Clinical stage from 08/27/2016: Stage IIE - Signed by Jeralyn Ruths, MD on 08/27/2016  Rectal cancer [facility]) Staging form: Colon and Rectum, AJCC 8th Edition - Clinical: Stage IVA Dewaine Oats, [place]) - Signed by Jeralyn Ruths, MD on 11/02/2020   Jeralyn Ruths, MD   10/16/2023 9:54 AM

## 2023-10-17 ENCOUNTER — Ambulatory Visit: Payer: 59 | Admitting: Cardiology

## 2023-10-18 ENCOUNTER — Inpatient Hospital Stay: Payer: 59

## 2023-10-18 DIAGNOSIS — C20 Malignant neoplasm of rectum: Secondary | ICD-10-CM

## 2023-10-18 DIAGNOSIS — Z5111 Encounter for antineoplastic chemotherapy: Secondary | ICD-10-CM | POA: Diagnosis not present

## 2023-10-18 LAB — CEA: CEA: 9.2 ng/mL — ABNORMAL HIGH (ref 0.0–4.7)

## 2023-10-18 MED ORDER — SODIUM CHLORIDE 0.9 % IV SOLN
Freq: Once | INTRAVENOUS | Status: AC
Start: 2023-10-18 — End: 2023-10-18
  Filled 2023-10-18: qty 250

## 2023-10-18 MED ORDER — HEPARIN SOD (PORK) LOCK FLUSH 100 UNIT/ML IV SOLN
500.0000 [IU] | Freq: Once | INTRAVENOUS | Status: DC | PRN
Start: 2023-10-18 — End: 2023-10-18
  Filled 2023-10-18: qty 5

## 2023-10-18 MED ORDER — PEGFILGRASTIM INJECTION 6 MG/0.6ML ~~LOC~~
6.0000 mg | PREFILLED_SYRINGE | Freq: Once | SUBCUTANEOUS | Status: AC
Start: 2023-10-18 — End: 2023-10-18
  Administered 2023-10-18: 6 mg via SUBCUTANEOUS
  Filled 2023-10-18: qty 0.6

## 2023-11-04 ENCOUNTER — Other Ambulatory Visit: Payer: Self-pay | Admitting: Oncology

## 2023-11-07 ENCOUNTER — Inpatient Hospital Stay: Payer: 59 | Attending: Oncology

## 2023-11-07 ENCOUNTER — Other Ambulatory Visit: Payer: Self-pay | Admitting: *Deleted

## 2023-11-07 ENCOUNTER — Telehealth: Payer: Self-pay | Admitting: *Deleted

## 2023-11-07 DIAGNOSIS — Z803 Family history of malignant neoplasm of breast: Secondary | ICD-10-CM | POA: Insufficient documentation

## 2023-11-07 DIAGNOSIS — Z7901 Long term (current) use of anticoagulants: Secondary | ICD-10-CM | POA: Insufficient documentation

## 2023-11-07 DIAGNOSIS — R399 Unspecified symptoms and signs involving the genitourinary system: Secondary | ICD-10-CM

## 2023-11-07 DIAGNOSIS — Z801 Family history of malignant neoplasm of trachea, bronchus and lung: Secondary | ICD-10-CM | POA: Diagnosis not present

## 2023-11-07 DIAGNOSIS — C7801 Secondary malignant neoplasm of right lung: Secondary | ICD-10-CM | POA: Diagnosis present

## 2023-11-07 DIAGNOSIS — C829A Follicular lymphoma, unspecified, in remission: Secondary | ICD-10-CM | POA: Insufficient documentation

## 2023-11-07 DIAGNOSIS — G629 Polyneuropathy, unspecified: Secondary | ICD-10-CM | POA: Diagnosis not present

## 2023-11-07 DIAGNOSIS — Z79899 Other long term (current) drug therapy: Secondary | ICD-10-CM | POA: Diagnosis not present

## 2023-11-07 DIAGNOSIS — I4891 Unspecified atrial fibrillation: Secondary | ICD-10-CM | POA: Insufficient documentation

## 2023-11-07 DIAGNOSIS — Z5112 Encounter for antineoplastic immunotherapy: Secondary | ICD-10-CM | POA: Insufficient documentation

## 2023-11-07 DIAGNOSIS — C20 Malignant neoplasm of rectum: Secondary | ICD-10-CM | POA: Diagnosis present

## 2023-11-07 DIAGNOSIS — Z79620 Long term (current) use of immunosuppressive biologic: Secondary | ICD-10-CM | POA: Diagnosis not present

## 2023-11-07 LAB — URINALYSIS, COMPLETE (UACMP) WITH MICROSCOPIC
Bilirubin Urine: NEGATIVE
Glucose, UA: NEGATIVE mg/dL
Ketones, ur: NEGATIVE mg/dL
Nitrite: POSITIVE — AB
Protein, ur: 100 mg/dL — AB
RBC / HPF: >50 RBC/hpf (ref 0–5)
Specific Gravity, Urine: 1.019 (ref 1.005–1.030)
Squamous Epithelial / HPF: 0 /[HPF] (ref 0–5)
WBC, UA: >50 WBC/hpf (ref 0–5)
pH: 5 (ref 5.0–8.0)

## 2023-11-07 MED ORDER — PHENAZOPYRIDINE HCL 200 MG PO TABS: 200.0000 mg | ORAL_TABLET | Freq: Three times a day (TID) | ORAL | 0 refills | Status: DC | PRN

## 2023-11-07 MED ORDER — SULFAMETHOXAZOLE-TRIMETHOPRIM 800-160 MG PO TABS: 1.0000 | ORAL_TABLET | Freq: Two times a day (BID) | ORAL | 0 refills | Status: DC

## 2023-11-07 NOTE — Telephone Encounter (Addendum)
 Daughter called reporting that she thinks patient has a UTI again and is asking if we can treatment or who to contact ? Please advise I called back for more specifics but it went to voice mail. I left message to call me back

## 2023-11-08 ENCOUNTER — Other Ambulatory Visit: Payer: Self-pay | Admitting: *Deleted

## 2023-11-08 DIAGNOSIS — C20 Malignant neoplasm of rectum: Secondary | ICD-10-CM

## 2023-11-09 LAB — URINE CULTURE: Culture: 70000 — AB

## 2023-11-11 ENCOUNTER — Inpatient Hospital Stay: Payer: 59

## 2023-11-11 ENCOUNTER — Other Ambulatory Visit: Payer: Self-pay | Admitting: *Deleted

## 2023-11-11 ENCOUNTER — Telehealth: Payer: Self-pay | Admitting: *Deleted

## 2023-11-11 ENCOUNTER — Ambulatory Visit
Admission: RE | Admit: 2023-11-11 | Discharge: 2023-11-11 | Disposition: A | Discharge status: Home / Self Care | Payer: 59 | Source: Ambulatory Visit | Attending: Oncology | Admitting: Oncology

## 2023-11-11 DIAGNOSIS — Z95828 Presence of other vascular implants and grafts: Secondary | ICD-10-CM

## 2023-11-11 DIAGNOSIS — C20 Malignant neoplasm of rectum: Secondary | ICD-10-CM | POA: Diagnosis present

## 2023-11-11 DIAGNOSIS — Z5112 Encounter for antineoplastic immunotherapy: Secondary | ICD-10-CM | POA: Diagnosis not present

## 2023-11-11 LAB — CBC WITH DIFFERENTIAL (CANCER CENTER ONLY)
Abs Immature Granulocytes: 0.05 10*3/uL (ref 0.00–0.07)
Basophils Absolute: 0 10*3/uL (ref 0.0–0.1)
Basophils Relative: 0 %
Eosinophils Absolute: 0 10*3/uL (ref 0.0–0.5)
Eosinophils Relative: 1 %
HCT: 37 % (ref 36.0–46.0)
Hemoglobin: 11.6 g/dL — ABNORMAL LOW (ref 12.0–15.0)
Immature Granulocytes: 1 %
Lymphocytes Relative: 22 %
Lymphs Abs: 1.8 10*3/uL (ref 0.7–4.0)
MCH: 33 pg (ref 26.0–34.0)
MCHC: 31.4 g/dL (ref 30.0–36.0)
MCV: 105.4 fL — ABNORMAL HIGH (ref 80.0–100.0)
Monocytes Absolute: 1.4 10*3/uL — ABNORMAL HIGH (ref 0.1–1.0)
Monocytes Relative: 17 %
Neutro Abs: 5.1 10*3/uL (ref 1.7–7.7)
Neutrophils Relative %: 59 %
Platelet Count: 168 10*3/uL (ref 150–400)
RBC: 3.51 MIL/uL — ABNORMAL LOW (ref 3.87–5.11)
RDW: 15.8 % — ABNORMAL HIGH (ref 11.5–15.5)
WBC Count: 8.4 10*3/uL (ref 4.0–10.5)
nRBC: 0 % (ref 0.0–0.2)

## 2023-11-11 LAB — CMP (CANCER CENTER ONLY)
ALT: 16 U/L (ref 0–44)
AST: 23 U/L (ref 15–41)
Albumin: 3.7 g/dL (ref 3.5–5.0)
Alkaline Phosphatase: 60 U/L (ref 38–126)
Anion gap: 10 (ref 5–15)
BUN: 20 mg/dL (ref 8–23)
CO2: 20 mmol/L — ABNORMAL LOW (ref 22–32)
Calcium: 9.2 mg/dL (ref 8.9–10.3)
Chloride: 105 mmol/L (ref 98–111)
Creatinine: 0.85 mg/dL (ref 0.44–1.00)
GFR, Estimated: >60 mL/min (ref >60)
Glucose, Bld: 88 mg/dL (ref 70–99)
Potassium: 4.4 mmol/L (ref 3.5–5.1)
Sodium: 135 mmol/L (ref 135–145)
Total Bilirubin: 0.7 mg/dL (ref 0.0–1.2)
Total Protein: 6.4 g/dL — ABNORMAL LOW (ref 6.5–8.1)

## 2023-11-11 MED ORDER — SODIUM CHLORIDE 0.9% FLUSH
10.0000 mL | Freq: Once | INTRAVENOUS | Status: AC
Start: 2023-11-11 — End: 2023-11-11
  Administered 2023-11-11: 10 mL via INTRAVENOUS
  Filled 2023-11-11: qty 10

## 2023-11-11 MED ORDER — IOHEXOL 300 MG/ML  SOLN
80.0000 mL | Freq: Once | INTRAMUSCULAR | Status: AC | PRN
  Administered 2023-11-11: 80 mL via INTRAVENOUS

## 2023-11-11 MED ORDER — CIPROFLOXACIN HCL 250 MG PO TABS: 250.0000 mg | ORAL_TABLET | Freq: Two times a day (BID) | ORAL | 0 refills | Status: DC

## 2023-11-11 MED ORDER — HEPARIN SOD (PORK) LOCK FLUSH 100 UNIT/ML IV SOLN
500.0000 [IU] | Freq: Once | INTRAVENOUS | Status: AC
Start: 2023-11-11 — End: 2023-11-11
  Administered 2023-11-11: 500 [IU] via INTRAVENOUS
  Filled 2023-11-11: qty 5

## 2023-11-11 NOTE — Telephone Encounter (Signed)
-----   Message from Evalene JINNY Reusing sent at 11/11/2023 12:26 PM EST ----- Looks like Bactrim  will not work on this UTI.  Can we call her in some Cipro ?  I think the dose is 250 twice per day for 7 days? ----- Message ----- From: Interface, Lab In [place] Sent: 11/07/2023  10:59 AM EST To: Evalene JINNY Reusing, MD

## 2023-11-11 NOTE — Telephone Encounter (Signed)
 Call placed to patients daughter Grayce to update on results of urine culture. Bactrim  that was prescribed last week is resistant, Dr. Jacobo recommends changing antibiotics to Cipro  250 mg BID for 7 days. Robin verbalized understanding and is in agreement with plan. Prescription sent to [facility]. Encouraged to call with any questions or concerns.

## 2023-11-12 LAB — CEA: CEA: 9.5 ng/mL — ABNORMAL HIGH (ref 0.0–4.7)

## 2023-11-18 ENCOUNTER — Inpatient Hospital Stay: Payer: 59 | Admitting: Oncology

## 2023-11-19 ENCOUNTER — Encounter: Payer: Self-pay | Admitting: Oncology

## 2023-11-19 ENCOUNTER — Inpatient Hospital Stay (HOSPITAL_BASED_OUTPATIENT_CLINIC_OR_DEPARTMENT_OTHER): Payer: 59 | Admitting: Oncology

## 2023-11-19 VITALS — BP 122/86 | HR 99 | Temp 98.6°F | Resp 16 | Ht <= 58 in | Wt 88.0 lb

## 2023-11-19 DIAGNOSIS — Z5112 Encounter for antineoplastic immunotherapy: Secondary | ICD-10-CM | POA: Diagnosis not present

## 2023-11-19 DIAGNOSIS — R918 Other nonspecific abnormal finding of lung field: Secondary | ICD-10-CM | POA: Diagnosis not present

## 2023-11-19 MED ORDER — LIDOCAINE-PRILOCAINE 2.5-2.5 % EX CREA: 1.0000 | TOPICAL_CREAM | CUTANEOUS | 2 refills | Status: DC | PRN

## 2023-11-19 NOTE — Progress Notes (Signed)
 [facility]  Telephone:([phone]) [phone] Fax:(336) [phone]  ID: Veronica Boyd OB: 03/06/1946  MR#: [id number]  RDW#:[id number]  Patient Care Team: Sadie Manna, MD as PCP - General (Internal Medicine) Perla Evalene PARAS, MD as PCP - Cardiology (Cardiology) Kennyth Chew, MD as PCP - Electrophysiology (Cardiology) Jacobo Evalene PARAS, MD as Consulting Physician (Oncology) Maurie Rayfield BIRCH, RN as Oncology Nurse Navigator Tamea Dedra CROME, MD as Consulting Physician (Pulmonary Disease)  CHIEF COMPLAINT: Recurrent stage IV rectal cancer, history of follicular lymphoma.  INTERVAL HISTORY: Patient returns to clinic today for further evaluation, discussion of her imaging results, and treatment planning.  She currently feels well and is asymptomatic.  Her appetite has improved and she has gained weight and inflammatory She has a chronic peripheral neuropathy, but no other neurologic complaints.  She denies any recent fevers or illnesses.  She has no chest pain, shortness of breath, cough, or hemoptysis.  She denies any nausea, vomiting, constipation or diarrhea.  She has no urinary complaints.  Patient offers no further specific complaints today.  REVIEW OF SYSTEMS:   Review of Systems  Constitutional:  Positive for malaise/fatigue. Negative for fever and weight loss.  Respiratory: Negative.  Negative for cough, hemoptysis and shortness of breath.   Cardiovascular: Negative.  Negative for chest pain, palpitations and leg swelling.  Gastrointestinal:  Negative for abdominal pain, blood in stool, diarrhea, melena, nausea and vomiting.  Genitourinary: Negative.  Negative for dysuria.  Musculoskeletal: Negative.  Negative for back pain, joint pain and neck pain.  Skin: Negative.  Negative for rash.  Neurological:  Positive for tingling and sensory change. Negative for focal weakness, weakness and headaches.  Psychiatric/Behavioral: Negative.  The patient is not  nervous/anxious and does not have insomnia.     As per HPI. Otherwise, a complete review of systems is negative.   PAST MEDICAL HISTORY: Past Medical History:  Diagnosis Date   Anemia due to antineoplastic chemotherapy    Aortic atherosclerosis (HCC)    Arthritis    Atrial fibrillation and flutter (HCC)    a.) 04/2023 Zio: Monitor 1 - 16 % aflutter burden w/ avg rate of 168, (78-226), possible Atach. Freq PACs, rare PVCs. Monitor 2 - 15% afib/flutter burden @ 115 [phone] Poss Atach. 4 beats NSVT. Freq PACs, rare PVCs; b.)  CHA2DS2-VASc = 4 (age x2, sex, vascular disease history); c.) cardiac rate/rhythm maintained on oral metoprolol  succinate; chronically anticoagulated using apixaban    Bronchial obstruction    Chemotherapy-induced neuropathy (HCC)    Chicken pox    Colon polyp    Coronary artery calcification seen on CT scan    a. 11/2022 CT Chest: Coronary artery calcification and aortic atherosclerosis.   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 04/2023 Echo: EF >55%, nl RV size/fxn.  No significant valvular disease observed.   Hyperlipidemia    Mass of right chest wall    Multiple lung nodules 09/2022   On apixaban  therapy    Osteoporosis    Pectus excavatum    Protein-calorie malnutrition, severe (HCC)    Rectal carcinoma (HCC)    a.) recurrent stage IVA (cT4a, cN2a, cM1a)   Thrombocytopenia (HCC)     PAST SURGICAL HISTORY: Past Surgical History:  Procedure Laterality Date   AXILLARY LYMPH NODE DISSECTION Right 08/21/2016   Procedure: AXILLARY LYMPH NODE  excision;  Surgeon: Larinda Unknown Sharps, MD;  Location: [facility] ORS;  Service: General;  Laterality: Right;   CATARACT EXTRACTION, BILATERAL Bilateral    COLONOSCOPY N/A 09/19/2020   Procedure: COLONOSCOPY;  Surgeon: Maryruth Ole DASEN, MD;  Location: [facility];   Service: Endoscopy;  Laterality: N/A;   ENDOBRONCHIAL ULTRASOUND Left 08/05/2023   Procedure: ENDOBRONCHIAL ULTRASOUND;  Surgeon: Tamea Dedra CROME, MD;  Location: [facility] ORS;  Service: Pulmonary;  Laterality: Left;   PORTA CATH INSERTION N/A 09/16/2017   Procedure: PORTA CATH INSERTION;  Surgeon: Marea Selinda RAMAN, MD;  Location: [facility];  Service: Cardiovascular;  Laterality: N/A;   TONSILLECTOMY     TOTAL HIP ARTHROPLASTY Left 1992    FAMILY HISTORY: Family History  Problem Relation Age of Onset   Heart failure Mother    Emphysema Father    Diabetes Sister    Lung cancer Brother    Diabetes Brother    Breast cancer Paternal Aunt    Basal cell carcinoma Daughter     ADVANCED DIRECTIVES (Y/N):  N  HEALTH MAINTENANCE: Social History   Tobacco Use   Smoking status: Never   Smokeless tobacco: Never  Vaping Use   Vaping status: Never Used  Substance Use Topics   Alcohol use: No   Drug use: No     Colonoscopy:  PAP:  Bone density:  Lipid panel:  No Known Allergies  Current Outpatient Medications  Medication Sig Dispense Refill   amiodarone  (PACERONE ) 200 MG tablet Take 1 tablet (200 mg) by mouth twice daily for 2 weeks, then decrease to 1 tablet (200 mg) by mouth daily. 90 tablet 3   apixaban  (ELIQUIS ) 5 MG TABS tablet Take 1 tablet (5 mg total) by mouth 2 (two) times daily. 180 tablet 3   diclofenac sodium (VOLTAREN) 1 % GEL Apply 2 g topically 2 (two) times daily as needed.     diphenoxylate -atropine  (LOMOTIL ) 2.5-0.025 MG tablet TAKE 2 TABLETS BY MOUTH 4 (FOUR) TIMES DAILY AS NEEDED FOR DIARRHEA OR LOOSE STOOLS. 60 tablet 1   gabapentin  (NEURONTIN ) 300 MG capsule TAKE 1 CAPSULE BY MOUTH TWICE A DAY 60 capsule 2   KLOR-CON  M20 20 MEQ tablet TAKE 1 TABLET BY MOUTH TWICE A DAY 180 tablet 1   lidocaine -prilocaine  (EMLA ) cream Apply 1 Application topically as needed. Apply to port 1 hour prior to use as needed 30 g 2   megestrol  (MEGACE ) 40 MG tablet TAKE 1 TABLET  BY MOUTH EVERY DAY 90 tablet 1   pantoprazole  (PROTONIX ) 20 MG tablet Take 1 tablet by mouth daily as needed for heartburn.     simvastatin  (ZOCOR ) 20 MG tablet Take 20 mg by mouth at bedtime.     traMADol-acetaminophen  (ULTRACET) 37.5-325 MG tablet Take 1 tablet by mouth every 8 (eight) hours as needed.     No current facility-administered medications for this visit.   Facility-Administered Medications Ordered in Other Visits  Medication Dose Route Frequency Provider Last Rate Last Admin   heparin  lock flush 100 unit/mL  500 Units Intravenous Once Evalisse Prajapati J, MD       heparin  lock flush 100 unit/mL  500 Units Intracatheter PRN Jacobo Evalene PARAS, MD       heparin  lock flush 100 unit/mL  500 Units Intravenous Once Suda Forbess J, MD       heparin  lock flush 100 unit/mL  500 Units Intravenous Once Morrisa Aldaba J, MD       sodium chloride  flush (NS) 0.9 % injection  10 mL  10 mL Intravenous PRN Shainna Faux J, MD   10 mL at 12/04/17 0901   sodium chloride  flush (NS) 0.9 % injection 10 mL  10 mL Intracatheter PRN Jacobo Evalene PARAS, MD       sodium chloride  flush (NS) 0.9 % injection 10 mL  10 mL Intravenous PRN Keath Matera J, MD   10 mL at 04/24/21 0839   yttrium-90 injection 22.3 millicurie  22.3 millicurie Intravenous Once Chrystal, Marcey, MD        OBJECTIVE: Vitals:   11/19/23 1436  BP: 122/86  Pulse: 99  Resp: 16  Temp: 98.6 F (37 C)  SpO2: 97%       Body mass index is 18.39 kg/m.    ECOG FS:1 - Symptomatic but completely ambulatory  General: Well-developed, well-nourished, no acute distress. Eyes: Pink conjunctiva, anicteric sclera. HEENT: Normocephalic, moist mucous membranes. Lungs: No audible wheezing or coughing. Heart: Regular rate and rhythm. Abdomen: Soft, nontender, no obvious distention. Musculoskeletal: No edema, cyanosis, or clubbing. Neuro: Alert, answering all questions appropriately. Cranial nerves grossly intact. Skin: No  rashes or petechiae noted. Psych: Normal affect.   LAB RESULTS:  Lab Results  Component Value Date   NA 135 11/11/2023   K 4.4 11/11/2023   CL 105 11/11/2023   CO2 20 (L) 11/11/2023   GLUCOSE 88 11/11/2023   BUN 20 11/11/2023   CREATININE 0.85 11/11/2023   CALCIUM  9.2 11/11/2023   PROT 6.4 (L) 11/11/2023   ALBUMIN 3.7 11/11/2023   AST 23 11/11/2023   ALT 16 11/11/2023   ALKPHOS 60 11/11/2023   BILITOT 0.7 11/11/2023   GFRNONAA >60 11/11/2023   GFRAA >60 06/28/2020    Lab Results  Component Value Date   WBC 8.4 11/11/2023   NEUTROABS 5.1 11/11/2023   HGB 11.6 (L) 11/11/2023   HCT 37.0 11/11/2023   MCV 105.4 (H) 11/11/2023   PLT 168 11/11/2023     STUDIES: CT CHEST ABDOMEN PELVIS W CONTRAST Result Date: 11/11/2023 CLINICAL DATA:  Rectal cancer, follow-up.  * Tracking Code: BO * EXAM: CT CHEST, ABDOMEN, AND PELVIS WITH CONTRAST TECHNIQUE: Multidetector CT imaging of the chest, abdomen and pelvis was performed following the standard protocol during bolus administration of intravenous contrast. RADIATION DOSE REDUCTION: This exam was performed according to the departmental dose-optimization program which includes automated exposure control, adjustment of the mA and/or kV according to patient size and/or use of iterative reconstruction technique. CONTRAST:  80mL OMNIPAQUE  IOHEXOL  300 MG/ML  SOLN COMPARISON:  Multiple priors including most recent CT July 10, 2023 FINDINGS: CT CHEST FINDINGS Cardiovascular: Right chest Port-A-Cath with tip near the superior cavoatrial junction. Aortic atherosclerosis. No central pulmonary embolus on this nondedicated study. Normal size heart. No significant pericardial effusion/thickening. Mediastinum/Nodes: No supraclavicular adenopathy. No suspicious thyroid  nodule. No pathologically enlarged mediastinal, hilar or axillary lymph nodes. Rightward shift of the mediastinum unchanged from prior. Lungs/Pleura: Increased nodularity in the right lower  lobe infrahilar right lower lobe mass measures 5.6 x 4.7 cm on image 96/4 previously 4.6 x 2.9 cm when remeasured in similar fashion. Increased size of the spiculated left lower lobe pulmonary nodule now measuring 2.5 x 1.9 cm on image 90/4 previously 1.9 x 1.4 cm. Increased size of additional bilateral pulmonary nodules for instance in the lateral left upper lobe measuring 9 mm on image 62/3 previously measuring 1 mm and in the right upper lobe measuring 10 mm on image 74/3 previously measuring 4 mm. Persistent peripheral right upper lobe consolidation  with air bronchograms for instance on image 41/4 Tiny right pleural effusion. Musculoskeletal: No aggressive lytic or blastic lesion of bone. Relative lucency of the T6-T9 vertebral bodies may reflects sequela of prior radiation therapy. CT ABDOMEN PELVIS FINDINGS Hepatobiliary: No suspicious hepatic lesion. Gallbladder is minimally distended. No biliary ductal dilation. Pancreas: No pancreatic ductal dilation or evidence of acute inflammation. Spleen: No splenomegaly. Adrenals/Urinary Tract: Bilateral adrenal glands appear normal. No hydronephrosis. Kidneys demonstrate symmetric enhancement and excretion of contrast material. Evaluation of the urinary bladder is limited by minimal distension and streak artifact from left hip arthroplasty. Stomach/Bowel: Radiopaque enteric contrast material traverses distal loops of small bowel. Stomach is distended with ingested material and gas without focal wall thickening. No pathologic dilation of small or large bowel. Similar mild wall thickening and mucosal hyperenhancement of loops of small bowel in the right hemipelvis for instance on image 88/2 possibly reflecting sequela of prior radiation therapy. Rectal wall thickening most pronounced in the low rectum again seen and is questionably progressed for instance on image 16/7. There is similar peritoneal thickening and fluid/soft tissue in the mesorectal/presacral space.  Although once again evaluation is limited by streak artifact from right hip arthroplasty. Vascular/Lymphatic: Aortic atherosclerosis. Normal caliber abdominal aorta. Smooth IVC contours. The portal, splenic and superior mesenteric veins are patent. No pathologically enlarged abdominal or pelvic lymph nodes. Reproductive: Gas is again seen in the endometrial cavity likely reflecting fistulous connection with bowel. Other: Similar trace pelvic free fluid. No discrete peritoneal or omental nodularity. Musculoskeletal: No aggressive lytic or blastic lesion of bone. Relative lucency of the sacrum may reflects sequela of prior radiation therapy. Multilevel degenerative change of the spine. Left total hip arthroplasty IMPRESSION: 1. Increased size of bilateral pulmonary nodules and infrahilar right lower lobe mass, consistent with worsening pulmonary metastatic disease. 2. Persistent peripheral right upper lobe consolidation with air bronchograms. 3. Rectal wall thickening most pronounced in the low rectum again seen and is questionably progressed consider correlation with proctoscopy. 4. Similar pelvic peritoneal thickening and fluid/soft tissue in the mesorectal/presacral space without new discrete soft tissue nodularity. 5. Similar mild wall thickening and mucosal hyperenhancement of loops of small bowel in the right hemipelvis possibly reflecting sequela of prior radiation therapy. 6. Gas is again seen in the endometrial cavity likely reflecting fistulous connection with bowel. 7.  Aortic Atherosclerosis (ICD10-I70.0). Electronically Signed   By: Reyes Holder M.D.   On: 11/11/2023 16:55    ONCOLOGY HISTORY:  Biopsy confirmed rectal cancer.  MRI completed at [facility] on October 11, 2020 revealed extension of the tumor beyond the wall into the rectovaginal recess with suspected invasion of the vaginal wall posteriorly.  Tumor also appears to involve the internal anal sphincter.  There are also numerous  prominent presacral and mesorectal lymph nodes highly suspicious for malignancy.  PET scan results from November 17, 2020 reviewed independently with 3 hypermetabolic right lower lobe lung nodules consistent with pulmonary metastasis.  Patient completed cycle 8 of FOLFOX on Mar 29, 2021.  She then completed cycle 5 of her 5-FU pump on May 26, 2021 and XRT on June 01, 2021.  PET scan results from July 17, 2021 reviewed independently with no evidence of disease other than enlarging right lower lobe lung lesion.  Patient completed SBRT to the right lung lesion on August 22, 2021.  Repeat PET scan on July 25, 2021 revealed minimal residual disease in the rectum and likely resolution of lesions in the lung.    ASSESSMENT: Recurrent stage  IV rectal cancer, history of follicular lymphoma.  PLAN:    Recurrent stage IV rectal cancer: See oncology history as above.  PET scan results from Mar 21, 2023 reviewed independently with clear progression of disease in patient's pelvis as well as multiple pulmonary nodules.  Her CEA initially increased to 29.8, trended down to 7.2, but has trended back up slightly to 9.5.  Hospice and end-of-life care were previously discussed, but patient preferred to continue to pursue treatment.  CT scan results from November 11, 2023 reviewed independently and report as above with significant improvement of disease burden except for her lung masses which have increased in size.  Will discontinue FOLFIRI plus Avastin .  Return to clinic on November 27, 2023 to initiate treatment for her lung masses.   Left lower lobe lung lesion: Increasing in size despite improvement of disease elsewhere.  Previously, biopsy from bronchoscopy did not reveal any evidence of malignancy.  Circular gene testing was positive for PD-L1 expression as well as ATM and CHEK2 mutations.  Imaging as above with increase in size of both left and right lung lesions.  Given the PD-L1 positivity, patient has agreed to  pursue treatment with single agent Keytruda  every 3 weeks.  Return to clinic as above to initiate cycle 1.   Recurrent follicular lymphoma: CT scan results from December 23, 2019 reviewed independently with no obvious evidence of recurrent or progressive disease.  PET scan results as above consistent with rectal cancer metastasis and no evidence of lymphoma.   Patient received Zevalin  on July 30, 2017 with not much therapeutic effect.  She subsequently underwent cycles 5 of R-CHOP chemotherapy with Neulasta  support completing on December 12, 2017.  Patient continues to be in complete remission.  Neuropathy: Chronic and unchanged. Pelvic pain: Patient does not complain of this today.  Continue tramadol as needed. Poor appetite/weight loss: Resolved.  Continue Megace  as prescribed. Hypokalemia: Resolved.  Continue oral potassium supplementation. Coping/adjustment: Patient was previously given a referral to [facility]. Atrial fibrillation: Appreciate cardiology input.  Patient now on amiodarone . Pelvic fracture: No interval needed at this time.  Continue symptomatic treatment with tramadol as above. UTI: Resolved.  Patient expressed understanding and was in agreement with this plan. She also understands that She can call clinic at any time with any questions, concerns, or complaints.    Cancer Staging  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 [facility]) Staging form: Lymphoid Neoplasms, AJCC 6th Edition - Clinical stage from 08/27/2016: Stage IIE - Signed by Jacobo Evalene PARAS, MD on 08/27/2016  Rectal cancer [facility]) Staging form: Colon and Rectum, AJCC 8th Edition - Clinical: Stage IVA Walt Senters, [email or web address]) - Signed by Jacobo Evalene PARAS, MD on 11/02/2020   Evalene PARAS Jacobo, MD   11/19/2023 4:20 PM

## 2023-11-21 ENCOUNTER — Encounter: Payer: Self-pay | Admitting: Oncology

## 2023-11-22 ENCOUNTER — Ambulatory Visit: Payer: 59 | Admitting: Cardiology

## 2023-11-22 ENCOUNTER — Encounter: Payer: Self-pay | Admitting: Oncology

## 2023-11-22 NOTE — Progress Notes (Deleted)
Cardiology Office Note Date:  11/22/2023  Patient ID:  Veronica Boyd, Veronica Boyd [birth year], MRN [id number] PCP:  Barbette Reichmann, MD  Cardiologist:  Julien Nordmann, MD Electrophysiologist: Nobie Putnam, MD (though has not met)    Chief Complaint: afib follow-up  History of Present Illness: Veronica Boyd is a 78 y.o. 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  I have seen her multiple times in the last 6 months for Afib. Amiodarone was started for afib w RVR   On follow-up today,  *** AF burden, symptoms *** palpitations *** bleeding concerns *** BP readings at home, falls  - reduce eliquis dose to 2.5   She continues to take eliquis 5mg  BID, no bleeding concerns.  She denies chest pain, palpitations, dyspnea, PND, orthopnea, nausea, vomiting, dizziness, syncope, edema, weight gain, or early satiety.       AAD History: Amiodarone    ROS:  Please see the history of present illness. All other systems are reviewed and otherwise negative.   PHYSICAL EXAM:  VS:  There were no vitals taken for this visit. BMI: There is no height or weight on file to calculate BMI.  Wt Readings from Last 3 Encounters:  11/19/23 88 lb (39.9 kg)  10/16/23 84 lb 14.4 oz (38.5 kg)  10/08/23 84 lb (38.1 kg)    GEN- frail, thin appearing, alert and oriented x 3 today.   Lungs- CTA,  normal work of breathing.  Heart- Regular rate and rhythm, no murmurs, rubs or gallops Extremities- No peripheral edema, warm, dry   EKG is ordered. Personal review of EKG from today shows:           Additional studies reviewed include: Previous EP, cardiology notes.   NM myocardial, 07/16/2023   Low risk, probably normal pharmacologic myocardial perfusion stress test.   There is a small in size, severe, fixed defect in the apical lateral segment most consistent with artifact but cannot rule out small area of scar.   No significant  ischemia is identified.   Left ventricular systolic function is normal (LVEF greater than 65%).   Coronary artery calcification and aortic atherosclerosis are noted.  Central venous catheter extends into the distal SVC.   Pectus excavatum is evident.  Long term monitor, 06/10/2023 Monitor 1 Normal sinus rhythm Patient had a min HR of 50 bpm, max HR of 226 bpm, and avg HR of 102 bpm.  Atrial Flutter occurred (16% burden), ranging from 78-226 bpm (avg of 126 bpm), the longest lasting 31 mins 49 secs with an avg rate of 168 bpm.  Possible atrial tachycardia Isolated SVEs were frequent (7.0%, 42231), SVE Couplets were rare (<1.0%, 1946), and SVE Triplets were rare (<1.0%, 824).  Isolated VEs were rare (<1.0%), VE Couplets were rare (<1.0%), and no VE Triplets were present.    Monitor 2 Normal sinus rhythm Patient had a min HR of 48 bpm, max HR of 235 bpm, and avg HR of 100 bpm.   1 run of Ventricular Tachycardia occurred lasting 4 beats with a max rate of 226 bpm (avg 202 bpm).   Atrial Fibrillation/Flutter occurred (15% burden), ranging from 66-235 bpm (avg of 115 bpm), the longest lasting 22 mins 56 secs with an avg rate of 102 bpm.  Possible atrial tachycardia.  Isolated SVEs were frequent (9.0%, [id number]), SVE Couplets were occasional (1.4%, 14036), and SVE Triplets were rare (<1.0%, 6711).  Isolated VEs were rare (<  1.0%), VE Couplets were rare (<1.0%), and no VE Triplets were present.   TTE, 04/08/2023  1. Left ventricular ejection fraction, by estimation, is >55%. The left ventricle has normal function. Left ventricular endocardial border not optimally defined to evaluate regional wall motion. Left ventricular diastolic function could not be evaluated.   2. Right ventricular systolic function is normal. The right ventricular size is normal.   3. The mitral valve was not well visualized. No evidence of mitral valve  regurgitation.   4. Tricuspid valve regurgitation not well assessed.   5. The  aortic valve was not well visualized. Aortic valve regurgitation not well assessed.   6. Pulmonic valve regurgitation not well assessed.   ASSESSMENT AND PLAN:  #) parox afib  #) high risk medication use - amiodarone #) hypotension #) tachycardia #) recurrent stage 4 rectal cancer Maintaining sinus rhythm on amiodaorne 200mg  daily Recent LFTs stable   Update thyroid labs later this week with oncology lab draws   #) Hypercoag d/t parox afib #) frequent falls CHA2DS2-VASc Score = 4 [CHF History: 0, HTN History: 0, Diabetes History: 0, Stroke History: 0, Vascular Disease History: 1, Age Score: 2, Gender Score: 1].  Therefore, the patient's annual risk of stroke is 4.8 %.    Stroke ppx - Reduce eliquis to 2.5mg  BID, dose reduced for age, weight Continue to monitor falls, may need to stop OAC if continues to have falls     Current medicines are reviewed at length with the patient today.   The patient does not have concerns regarding her medicines.  The following changes were made today:     Labs/ tests ordered today include:  No orders of the defined types were placed in this encounter.    Disposition: Follow up with EP APP  2 months    Signed, Sherie Don, NP  11/22/23  9:08 AM  Electrophysiology [facility]

## 2023-11-27 ENCOUNTER — Other Ambulatory Visit: Payer: Self-pay | Admitting: *Deleted

## 2023-11-27 ENCOUNTER — Inpatient Hospital Stay (HOSPITAL_BASED_OUTPATIENT_CLINIC_OR_DEPARTMENT_OTHER): Payer: 59 | Admitting: Oncology

## 2023-11-27 ENCOUNTER — Inpatient Hospital Stay: Payer: 59

## 2023-11-27 ENCOUNTER — Encounter: Payer: Self-pay | Admitting: Oncology

## 2023-11-27 VITALS — BP 130/94 | HR 99 | Temp 98.5°F | Resp 16 | Ht <= 58 in | Wt 88.0 lb

## 2023-11-27 DIAGNOSIS — C20 Malignant neoplasm of rectum: Secondary | ICD-10-CM | POA: Diagnosis not present

## 2023-11-27 DIAGNOSIS — Z5112 Encounter for antineoplastic immunotherapy: Secondary | ICD-10-CM | POA: Diagnosis not present

## 2023-11-27 DIAGNOSIS — R918 Other nonspecific abnormal finding of lung field: Secondary | ICD-10-CM | POA: Diagnosis not present

## 2023-11-27 LAB — CMP (CANCER CENTER ONLY)
ALT: 15 U/L (ref 0–44)
AST: 24 U/L (ref 15–41)
Albumin: 3.6 g/dL (ref 3.5–5.0)
Alkaline Phosphatase: 56 U/L (ref 38–126)
Anion gap: 11 (ref 5–15)
BUN: 12 mg/dL (ref 8–23)
CO2: 21 mmol/L — ABNORMAL LOW (ref 22–32)
Calcium: 9.2 mg/dL (ref 8.9–10.3)
Chloride: 108 mmol/L (ref 98–111)
Creatinine: 0.6 mg/dL (ref 0.44–1.00)
GFR, Estimated: >60 mL/min (ref >60)
Glucose, Bld: 128 mg/dL — ABNORMAL HIGH (ref 70–99)
Potassium: 3.7 mmol/L (ref 3.5–5.1)
Sodium: 140 mmol/L (ref 135–145)
Total Bilirubin: 0.5 mg/dL (ref 0.0–1.2)
Total Protein: 6.5 g/dL (ref 6.5–8.1)

## 2023-11-27 LAB — CBC WITH DIFFERENTIAL (CANCER CENTER ONLY)
Abs Immature Granulocytes: 0.03 10*3/uL (ref 0.00–0.07)
Basophils Absolute: 0 10*3/uL (ref 0.0–0.1)
Basophils Relative: 0 %
Eosinophils Absolute: 0.1 10*3/uL (ref 0.0–0.5)
Eosinophils Relative: 1 %
HCT: 38.8 % (ref 36.0–46.0)
Hemoglobin: 12.2 g/dL (ref 12.0–15.0)
Immature Granulocytes: 1 %
Lymphocytes Relative: 27 %
Lymphs Abs: 1.6 10*3/uL (ref 0.7–4.0)
MCH: 33.1 pg (ref 26.0–34.0)
MCHC: 31.4 g/dL (ref 30.0–36.0)
MCV: 105.1 fL — ABNORMAL HIGH (ref 80.0–100.0)
Monocytes Absolute: 0.7 10*3/uL (ref 0.1–1.0)
Monocytes Relative: 12 %
Neutro Abs: 3.5 10*3/uL (ref 1.7–7.7)
Neutrophils Relative %: 59 %
Platelet Count: 171 10*3/uL (ref 150–400)
RBC: 3.69 MIL/uL — ABNORMAL LOW (ref 3.87–5.11)
RDW: 14.5 % (ref 11.5–15.5)
WBC Count: 5.9 10*3/uL (ref 4.0–10.5)
nRBC: 0 % (ref 0.0–0.2)

## 2023-11-27 LAB — TSH: TSH: 24.735 u[IU]/mL — ABNORMAL HIGH (ref 0.350–4.500)

## 2023-11-27 MED ORDER — HEPARIN SOD (PORK) LOCK FLUSH 100 UNIT/ML IV SOLN
500.0000 [IU] | Freq: Once | INTRAVENOUS | Status: AC | PRN
  Administered 2023-11-27: 500 [IU]
  Filled 2023-11-27: qty 5

## 2023-11-27 MED ORDER — HYDROCOD POLI-CHLORPHE POLI ER 10-8 MG/5ML PO SUER: 5.0000 mL | Freq: Two times a day (BID) | ORAL | 0 refills | Status: DC | PRN

## 2023-11-27 MED ORDER — SODIUM CHLORIDE 0.9 % IV SOLN
200.0000 mg | Freq: Once | INTRAVENOUS | Status: AC
  Administered 2023-11-27: 200 mg via INTRAVENOUS
  Filled 2023-11-27: qty 8

## 2023-11-27 MED ORDER — SODIUM CHLORIDE 0.9 % IV SOLN
INTRAVENOUS | Status: DC
  Filled 2023-11-27: qty 250

## 2023-11-27 NOTE — Progress Notes (Signed)
Patient declined [facility] behavioral health services. Patient shared that after thinking about it, she is not in need of services at this time as she feels she is in a very good place. She will keep our contact info for if things change in the future.

## 2023-11-27 NOTE — Patient Instructions (Signed)
[facility] - A DEPT OF [facility]  Discharge Instructions: Thank you for choosing [facility] to provide your oncology and hematology care.  If you have a lab appointment with the Cancer Center, please go directly to the Cancer Center and check in at the registration area.  Wear comfortable clothing and clothing appropriate for easy access to any Portacath or PICC line.   We strive to give you quality time with your provider. You may need to reschedule your appointment if you arrive late (15 or more minutes).  Arriving late affects you and other patients whose appointments are after yours.  Also, if you miss three or more appointments without notifying the office, you may be dismissed from the clinic at the provider's discretion.      For prescription refill requests, have your pharmacy contact our office and allow 72 hours for refills to be completed.    Today you received the following chemotherapy and/or immunotherapy agents Keytruda      To help prevent nausea and vomiting after your treatment, we encourage you to take your nausea medication as directed.  BELOW ARE SYMPTOMS THAT SHOULD BE REPORTED IMMEDIATELY: *FEVER GREATER THAN 100.4 F (38 C) OR HIGHER *CHILLS OR SWEATING *NAUSEA AND VOMITING THAT IS NOT CONTROLLED WITH YOUR NAUSEA MEDICATION *UNUSUAL SHORTNESS OF BREATH *UNUSUAL BRUISING OR BLEEDING *URINARY PROBLEMS (pain or burning when urinating, or frequent urination) *BOWEL PROBLEMS (unusual diarrhea, constipation, pain near the anus) TENDERNESS IN MOUTH AND THROAT WITH OR WITHOUT PRESENCE OF ULCERS (sore throat, sores in mouth, or a toothache) UNUSUAL RASH, SWELLING OR PAIN  UNUSUAL VAGINAL DISCHARGE OR ITCHING   Items with * indicate a potential emergency and should be followed up as soon as possible or go to the Emergency Department if any problems should occur.  Please show the CHEMOTHERAPY ALERT CARD or IMMUNOTHERAPY  ALERT CARD at check-in to the Emergency Department and triage nurse.  Should you have questions after your visit or need to cancel or reschedule your appointment, please contact [facility] - A DEPT OF [name]  [phone] and follow the prompts.  Office hours are 8:00 a.m. to 4:30 p.m. Monday - Friday. Please note that voicemails left after 4:00 p.m. may not be returned until the following business day.  We are closed weekends and major holidays. You have access to a nurse at all times for urgent questions. Please call the main number to the clinic [phone] and follow the prompts.  For any non-urgent questions, you may also contact your provider using MyChart. We now offer e-Visits for anyone 52 and older to request care online for non-urgent symptoms. For details visit [email or web address].   Also download the MyChart app! Go to the app store, search "MyChart", open the app, select [place], and log in with your MyChart username and password.    Pembrolizumab Injection What is this medication? PEMBROLIZUMAB (PEM broe LIZ ue mab) treats some types of cancer. It works by helping your immune system slow or stop the spread of cancer cells. It is a monoclonal antibody. This medicine may be used for other purposes; ask your health care provider or pharmacist if you have questions. COMMON BRAND NAME(S): Keytruda What should I tell my care team before I take this medication? They need to know if you have any of these conditions: Allogeneic stem cell transplant (uses someone else's stem cells) Autoimmune diseases, such as  Crohn disease, ulcerative colitis, lupus History of chest radiation Nervous system problems, such as Guillain-Barre syndrome, myasthenia gravis Organ transplant An unusual or allergic reaction to pembrolizumab, other medications, foods, dyes, or preservatives Pregnant or trying to get pregnant Breast-feeding How should I use this  medication? This medication is injected into a vein. It is given by your care team in a hospital or clinic setting. A special MedGuide will be given to you before each treatment. Be sure to read this information carefully each time. Talk to your care team about the use of this medication in children. While it may be prescribed for children as young as 6 months for selected conditions, precautions do apply. Overdosage: If you think you have taken too much of this medicine contact a poison control center or emergency room at once. NOTE: This medicine is only for you. Do not share this medicine with others. What if I miss a dose? Keep appointments for follow-up doses. It is important not to miss your dose. Call your care team if you are unable to keep an appointment. What may interact with this medication? Interactions have not been studied. This list may not describe all possible interactions. Give your health care provider a list of all the medicines, herbs, non-prescription drugs, or dietary supplements you use. Also tell them if you smoke, drink alcohol, or use illegal drugs. Some items may interact with your medicine. What should I watch for while using this medication? Your condition will be monitored carefully while you are receiving this medication. You may need blood work while taking this medication. This medication may cause serious skin reactions. They can happen weeks to months after starting the medication. Contact your care team right away if you notice fevers or flu-like symptoms with a rash. The rash may be red or purple and then turn into blisters or peeling of the skin. You may also notice a red rash with swelling of the face, lips, or lymph nodes in your neck or under your arms. Tell your care team right away if you have any change in your eyesight. Talk to your care team if you may be pregnant. Serious birth defects can occur if you take this medication during pregnancy and for 4  months after the last dose. You will need a negative pregnancy test before starting this medication. Contraception is recommended while taking this medication and for 4 months after the last dose. Your care team can help you find the option that works for you. Do not breastfeed while taking this medication and for 4 months after the last dose. What side effects may I notice from receiving this medication? Side effects that you should report to your care team as soon as possible: Allergic reactions--skin rash, itching, hives, swelling of the face, lips, tongue, or throat Dry cough, shortness of breath or trouble breathing Eye pain, redness, irritation, or discharge with blurry or decreased vision Heart muscle inflammation--unusual weakness or fatigue, shortness of breath, chest pain, fast or irregular heartbeat, dizziness, swelling of the ankles, feet, or hands Hormone gland problems--headache, sensitivity to light, unusual weakness or fatigue, dizziness, fast or irregular heartbeat, increased sensitivity to cold or heat, excessive sweating, constipation, hair loss, increased thirst or amount of urine, tremors or shaking, irritability Infusion reactions--chest pain, shortness of breath or trouble breathing, feeling faint or lightheaded Kidney injury (glomerulonephritis)--decrease in the amount of urine, red or dark brown urine, foamy or bubbly urine, swelling of the ankles, hands, or feet Liver injury--right upper belly  pain, loss of appetite, nausea, light-colored stool, dark yellow or brown urine, yellowing skin or eyes, unusual weakness or fatigue Pain, tingling, or numbness in the hands or feet, muscle weakness, change in vision, confusion or trouble speaking, loss of balance or coordination, trouble walking, seizures Rash, fever, and swollen lymph nodes Redness, blistering, peeling, or loosening of the skin, including inside the mouth Sudden or severe stomach pain, bloody diarrhea, fever, nausea,  vomiting Side effects that usually do not require medical attention (report to your care team if they continue or are bothersome): Bone, joint, or muscle pain Diarrhea Fatigue Loss of appetite Nausea Skin rash This list may not describe all possible side effects. Call your doctor for medical advice about side effects. You may report side effects to FDA at 1-800-FDA-1088. Where should I keep my medication? This medication is given in a hospital or clinic. It will not be stored at home. NOTE: This sheet is a summary. It may not cover all possible information. If you have questions about this medicine, talk to your doctor, pharmacist, or health care provider.  2024 [employer]/Gold Standard (2022-03-06 00:00:00)

## 2023-11-27 NOTE — Progress Notes (Signed)
[facility]  [phone] [phone]  ID: Veronica Boyd OB: 11/11/1945  MR#: [id number]  AOZ#:[id number]  Patient Care Team: Barbette Reichmann, MD as PCP - General (Internal Medicine) Antonieta Iba, MD as PCP - Cardiology (Cardiology) Nobie Putnam, MD as PCP - Electrophysiology (Cardiology) Jeralyn Ruths, MD as Consulting Physician (Oncology) Benita Gutter, RN as Oncology Nurse Navigator Salena Saner, MD as Consulting Physician (Pulmonary Disease)  CHIEF COMPLAINT: Recurrent stage IV rectal cancer, history of follicular lymphoma.  INTERVAL HISTORY: Patient returns to clinic today for further evaluation and initiation of Keytruda.  She currently feels well and is asymptomatic. She currently feels well and is asymptomatic.  She has a good appetite and denies weight loss.  She has a chronic cough that is worse at night.  Her peripheral neuropathy is unchanged.  She has no other neurologic complaints. She denies any recent fevers or illnesses.  She has no chest pain, shortness of breath, or hemoptysis.  She denies any nausea, vomiting, constipation or diarrhea.  She has no urinary complaints.  Patient offers no further specific complaints today.  REVIEW OF SYSTEMS:   Review of Systems  Constitutional:  Positive for malaise/fatigue. Negative for fever and weight loss.  Respiratory:  Positive for cough. Negative for hemoptysis and shortness of breath.   Cardiovascular: Negative.  Negative for chest pain, palpitations and leg swelling.  Gastrointestinal:  Negative for abdominal pain, blood in stool, diarrhea, melena, nausea and vomiting.  Genitourinary: Negative.  Negative for dysuria.  Musculoskeletal: Negative.  Negative for back pain, joint pain and neck pain.  Skin: Negative.  Negative for rash.  Neurological:  Positive for tingling and sensory change. Negative for focal weakness, weakness and headaches.  Psychiatric/Behavioral:  Negative.  The patient is not nervous/anxious and does not have insomnia.     As per HPI. Otherwise, a complete review of systems is negative.   PAST MEDICAL HISTORY: Past Medical History:  Diagnosis Date   Anemia due to antineoplastic chemotherapy    Aortic atherosclerosis (HCC)    Arthritis    Atrial fibrillation and flutter (HCC)    a.) 04/2023 Zio: Monitor 1 - 16 % aflutter burden w/ avg rate of 168, (78-226), possible Atach. Freq PACs, rare PVCs. Monitor 2 - 15% afib/flutter burden @ 115 [phone] Poss Atach. 4 beats NSVT. Freq PACs, rare PVCs; b.)  CHA2DS2-VASc = 4 (age x2, sex, vascular disease history); c.) cardiac rate/rhythm maintained on oral metoprolol succinate; chronically anticoagulated using apixaban   Bronchial obstruction    Chemotherapy-induced neuropathy (HCC)    Chicken pox    Colon polyp    Coronary artery calcification seen on CT scan    a. 11/2022 CT Chest: Coronary artery calcification and aortic atherosclerosis.   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 04/2023 Echo: EF >55%, nl RV size/fxn.  No significant valvular disease observed.   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, cN2a, cM1a)   Thrombocytopenia (HCC)     PAST SURGICAL HISTORY: Past Surgical History:  Procedure Laterality Date  AXILLARY LYMPH NODE DISSECTION Right 08/21/2016   Procedure: AXILLARY LYMPH NODE excision;  Surgeon: Nadeen Landau, MD;  Location: [facility] ORS;  Service: General;  Laterality: Right;   CATARACT EXTRACTION, BILATERAL Bilateral    COLONOSCOPY N/A 09/19/2020   Procedure: COLONOSCOPY;  Surgeon: Regis Bill, MD;   Location: [facility] ENDOSCOPY;  Service: Endoscopy;  Laterality: N/A;   ENDOBRONCHIAL ULTRASOUND Left 08/05/2023   Procedure: ENDOBRONCHIAL ULTRASOUND;  Surgeon: Salena Saner, MD;  Location: [facility] ORS;  Service: Pulmonary;  Laterality: Left;   PORTA CATH INSERTION N/A 09/16/2017   Procedure: PORTA CATH INSERTION;  Surgeon: Annice Needy, MD;  Location: [facility] INVASIVE CV LAB;  Service: Cardiovascular;  Laterality: N/A;   TONSILLECTOMY     TOTAL HIP ARTHROPLASTY Left 1992    FAMILY HISTORY: Family History  Problem Relation Age of Onset   Heart failure Mother    Emphysema Father    Diabetes Sister    Lung cancer Brother    Diabetes Brother    Breast cancer Paternal Aunt    Basal cell carcinoma Daughter     ADVANCED DIRECTIVES (Y/N):  N  HEALTH MAINTENANCE: Social History   Tobacco Use   Smoking status: Never   Smokeless tobacco: Never  Vaping Use   Vaping status: Never Used  Substance Use Topics   Alcohol use: No   Drug use: No     Colonoscopy:  PAP:  Bone density:  Lipid panel:  No Known Allergies  Current Outpatient Medications  Medication Sig Dispense Refill   amiodarone (PACERONE) 200 MG tablet Take 1 tablet (200 mg) by mouth twice daily for 2 weeks, then decrease to 1 tablet (200 mg) by mouth daily. 90 tablet 3   apixaban (ELIQUIS) 5 MG TABS tablet Take 1 tablet (5 mg total) by mouth 2 (two) times daily. 180 tablet 3   diclofenac sodium (VOLTAREN) 1 % GEL Apply 2 g topically 2 (two) times daily as needed.     diphenoxylate-atropine (LOMOTIL) 2.5-0.025 MG tablet TAKE 2 TABLETS BY MOUTH 4 (FOUR) TIMES DAILY AS NEEDED FOR DIARRHEA OR LOOSE STOOLS. 60 tablet 1   gabapentin (NEURONTIN) 300 MG capsule TAKE 1 CAPSULE BY MOUTH TWICE A DAY 60 capsule 2   KLOR-CON M20 20 MEQ tablet TAKE 1 TABLET BY MOUTH TWICE A DAY 180 tablet 1   lidocaine-prilocaine (EMLA) cream Apply 1 Application topically as needed. Apply to port 1 hour prior to use as needed 30 g 2   megestrol (MEGACE)  40 MG tablet TAKE 1 TABLET BY MOUTH EVERY DAY 90 tablet 1   pantoprazole (PROTONIX) 20 MG tablet Take 1 tablet by mouth daily as needed for heartburn.     simvastatin (ZOCOR) 20 MG tablet Take 20 mg by mouth at bedtime.     traMADol-acetaminophen (ULTRACET) 37.5-325 MG tablet Take 1 tablet by mouth every 8 (eight) hours as needed.     No current facility-administered medications for this visit.   Facility-Administered Medications Ordered in Other Visits  Medication Dose Route Frequency Provider Last Rate Last Admin   0.9 %  sodium chloride infusion   Intravenous Continuous Orlie Dakin, Tollie Pizza, MD       heparin lock flush 100 unit/mL  500 Units Intravenous Once Jeralyn Ruths, MD       heparin lock flush 100 unit/mL  500 Units Intracatheter PRN Jeralyn Ruths, MD       heparin lock flush 100 unit/mL  500 Units Intravenous Once Jeralyn Ruths, MD  heparin lock flush 100 unit/mL  500 Units Intravenous Once Jeralyn Ruths, MD       heparin lock flush 100 unit/mL  500 Units Intracatheter Once PRN Jeralyn Ruths, MD       pembrolizumab [facility]) 200 mg in sodium chloride 0.9 % 50 mL chemo infusion  200 mg Intravenous Once Jeralyn Ruths, MD       sodium chloride flush (NS) 0.9 % injection 10 mL  10 mL Intravenous PRN Jeralyn Ruths, MD   10 mL at 12/04/17 0901   sodium chloride flush (NS) 0.9 % injection 10 mL  10 mL Intracatheter PRN Jeralyn Ruths, MD       sodium chloride flush (NS) 0.9 % injection 10 mL  10 mL Intravenous PRN Jeralyn Ruths, MD   10 mL at 04/24/21 0839   yttrium-90 injection 22.3 millicurie  22.3 millicurie Intravenous Once Chrystal, Sherrine Maples, MD        OBJECTIVE: Vitals:   11/27/23 0859  BP: (!) 130/94  Pulse: 99  Resp: 16  Temp: 98.5 F (36.9 C)  SpO2: 98%       Body mass index is 18.39 kg/m.    ECOG FS:1 - Symptomatic but completely ambulatory  General: Well-developed, well-nourished, no acute distress. Eyes: Pink  conjunctiva, anicteric sclera. HEENT: Normocephalic, moist mucous membranes. Lungs: No audible wheezing or coughing. Heart: Regular rate and rhythm. Abdomen: Soft, nontender, no obvious distention. Musculoskeletal: No edema, cyanosis, or clubbing. Neuro: Alert, answering all questions appropriately. Cranial nerves grossly intact. Skin: No rashes or petechiae noted. Psych: Normal affect.   LAB RESULTS:  Lab Results  Component Value Date   NA 140 11/27/2023   K 3.7 11/27/2023   CL 108 11/27/2023   CO2 21 (L) 11/27/2023   GLUCOSE 128 (H) 11/27/2023   BUN 12 11/27/2023   CREATININE 0.60 11/27/2023   CALCIUM 9.2 11/27/2023   PROT 6.5 11/27/2023   ALBUMIN 3.6 11/27/2023   AST 24 11/27/2023   ALT 15 11/27/2023   ALKPHOS 56 11/27/2023   BILITOT 0.5 11/27/2023   GFRNONAA >60 11/27/2023   GFRAA >60 06/28/2020    Lab Results  Component Value Date   WBC 5.9 11/27/2023   NEUTROABS 3.5 11/27/2023   HGB 12.2 11/27/2023   HCT 38.8 11/27/2023   MCV 105.1 (H) 11/27/2023   PLT 171 11/27/2023     STUDIES: CT CHEST ABDOMEN PELVIS W CONTRAST Result Date: 11/11/2023 CLINICAL DATA:  Rectal cancer, follow-up.  * Tracking Code: BO * EXAM: CT CHEST, ABDOMEN, AND PELVIS WITH CONTRAST TECHNIQUE: Multidetector CT imaging of the chest, abdomen and pelvis was performed following the standard protocol during bolus administration of intravenous contrast. RADIATION DOSE REDUCTION: This exam was performed according to the departmental dose-optimization program which includes automated exposure control, adjustment of the mA and/or kV according to patient size and/or use of iterative reconstruction technique. CONTRAST:  80mL OMNIPAQUE IOHEXOL 300 MG/ML  SOLN COMPARISON:  Multiple priors including most recent CT July 10, 2023 FINDINGS: CT CHEST FINDINGS Cardiovascular: Right chest Port-A-Cath with tip near the superior cavoatrial junction. Aortic atherosclerosis. No central pulmonary embolus on this  nondedicated study. Normal size heart. No significant pericardial effusion/thickening. Mediastinum/Nodes: No supraclavicular adenopathy. No suspicious thyroid nodule. No pathologically enlarged mediastinal, hilar or axillary lymph nodes. Rightward shift of the mediastinum unchanged from prior. Lungs/Pleura: Increased nodularity in the right lower lobe infrahilar right lower lobe mass measures 5.6 x 4.7 cm on image 96/4 previously 4.6 x 2.9 cm  when remeasured in similar fashion. Increased size of the spiculated left lower lobe pulmonary nodule now measuring 2.5 x 1.9 cm on image 90/4 previously 1.9 x 1.4 cm. Increased size of additional bilateral pulmonary nodules for instance in the lateral left upper lobe measuring 9 mm on image 62/3 previously measuring 1 mm and in the right upper lobe measuring 10 mm on image 74/3 previously measuring 4 mm. Persistent peripheral right upper lobe consolidation with air bronchograms for instance on image 41/4 Tiny right pleural effusion. Musculoskeletal: No aggressive lytic or blastic lesion of bone. Relative lucency of the T6-T9 vertebral bodies may reflects sequela of prior radiation therapy. CT ABDOMEN PELVIS FINDINGS Hepatobiliary: No suspicious hepatic lesion. Gallbladder is minimally distended. No biliary ductal dilation. Pancreas: No pancreatic ductal dilation or evidence of acute inflammation. Spleen: No splenomegaly. Adrenals/Urinary Tract: Bilateral adrenal glands appear normal. No hydronephrosis. Kidneys demonstrate symmetric enhancement and excretion of contrast material. Evaluation of the urinary bladder is limited by minimal distension and streak artifact from left hip arthroplasty. Stomach/Bowel: Radiopaque enteric contrast material traverses distal loops of small bowel. Stomach is distended with ingested material and gas without focal wall thickening. No pathologic dilation of small or large bowel. Similar mild wall thickening and mucosal hyperenhancement of loops  of small bowel in the right hemipelvis for instance on image 88/2 possibly reflecting sequela of prior radiation therapy. Rectal wall thickening most pronounced in the low rectum again seen and is questionably progressed for instance on image 16/7. There is similar peritoneal thickening and fluid/soft tissue in the mesorectal/presacral space. Although once again evaluation is limited by streak artifact from right hip arthroplasty. Vascular/Lymphatic: Aortic atherosclerosis. Normal caliber abdominal aorta. Smooth IVC contours. The portal, splenic and superior mesenteric veins are patent. No pathologically enlarged abdominal or pelvic lymph nodes. Reproductive: Gas is again seen in the endometrial cavity likely reflecting fistulous connection with bowel. Other: Similar trace pelvic free fluid. No discrete peritoneal or omental nodularity. Musculoskeletal: No aggressive lytic or blastic lesion of bone. Relative lucency of the sacrum may reflects sequela of prior radiation therapy. Multilevel degenerative change of the spine. Left total hip arthroplasty IMPRESSION: 1. Increased size of bilateral pulmonary nodules and infrahilar right lower lobe mass, consistent with worsening pulmonary metastatic disease. 2. Persistent peripheral right upper lobe consolidation with air bronchograms. 3. Rectal wall thickening most pronounced in the low rectum again seen and is questionably progressed consider correlation with proctoscopy. 4. Similar pelvic peritoneal thickening and fluid/soft tissue in the mesorectal/presacral space without new discrete soft tissue nodularity. 5. Similar mild wall thickening and mucosal hyperenhancement of loops of small bowel in the right hemipelvis possibly reflecting sequela of prior radiation therapy. 6. Gas is again seen in the endometrial cavity likely reflecting fistulous connection with bowel. 7.  Aortic Atherosclerosis (ICD10-I70.0). Electronically Signed   By: Maudry Mayhew M.D.   On:  11/11/2023 16:55    ONCOLOGY HISTORY:  Biopsy confirmed rectal cancer.  MRI completed at [facility] on October 11, 2020 revealed extension of the tumor beyond the wall into the rectovaginal recess with suspected invasion of the vaginal wall posteriorly.  Tumor also appears to involve the internal anal sphincter.  There are also numerous prominent presacral and mesorectal lymph nodes highly suspicious for malignancy.  PET scan results from November 17, 2020 reviewed independently with 3 hypermetabolic right lower lobe lung nodules consistent with pulmonary metastasis.  Patient completed cycle 8 of FOLFOX on Mar 29, 2021.  She then completed cycle 5 of her 5-FU  pump on May 26, 2021 and XRT on June 01, 2021.  PET scan results from July 17, 2021 reviewed independently with no evidence of disease other than enlarging right lower lobe lung lesion.  Patient completed SBRT to the right lung lesion on August 22, 2021.  Repeat PET scan on July 25, 2021 revealed minimal residual disease in the rectum and likely resolution of lesions in the lung.    ASSESSMENT: Recurrent stage IV rectal cancer, history of follicular lymphoma.  PLAN:    Recurrent stage IV rectal cancer: See oncology history as above.  PET scan results from Mar 21, 2023 reviewed independently with clear progression of disease in patient's pelvis as well as multiple pulmonary nodules.  Her CEA initially increased to 29.8, trended down to 7.2, but has trended back up slightly to 9.5.  Hospice and end-of-life care were previously discussed, but patient preferred to continue to pursue treatment.  CT scan results from November 11, 2023 reviewed independently and reported as above with significant improvement of disease burden except for her lung masses which have increased in size.  Will discontinue FOLFIRI plus Avastin.  Proceed with cycle 1 of single agent Keytruda.  Return to clinic in 3 weeks for further evaluation and consideration of cycle  2.  Will reimage after 4 cycles.   Left lower lobe lung lesion: Increasing in size despite improvement of disease elsewhere.  Previously, biopsy from bronchoscopy did not reveal any evidence of malignancy.  Circular gene testing was positive for PD-L1 expression as well as ATM and CHEK2 mutations.  Imaging as above with increase in size of both left and right lung lesions.  Given the PD-L1 positivity, patient has agreed to pursue treatment with single agent Keytruda every 3 weeks as above. Recurrent follicular lymphoma: CT scan results from December 23, 2019 reviewed independently with no obvious evidence of recurrent or progressive disease.  PET scan results as above consistent with rectal cancer metastasis and no evidence of lymphoma.   Patient received Zevalin on July 30, 2017 with not much therapeutic effect.  She subsequently underwent cycles 5 of R-CHOP chemotherapy with Neulasta support completing on December 12, 2017.  Patient continues to be in complete remission.  Neuropathy: Chronic and unchanged. Pelvic pain: Patient does not complain of this today.  Continue tramadol as needed. Poor appetite/weight loss: Resolved.  Continue Megace as prescribed. Hypokalemia: Resolved.  Continue oral potassium supplementation. Coping/adjustment: Patient was previously given a referral to [facility]. Atrial fibrillation: Appreciate cardiology input.  Patient now on amiodarone. Pelvic fracture: No interval needed at this time.  Continue symptomatic treatment with tramadol as above. Cough: Patient was given a prescription for Tussionex today.  I spent a total of 30 minutes reviewing chart data, face-to-face evaluation with the patient, counseling and coordination of care as detailed above.   Patient expressed understanding and was in agreement with this plan. She also understands that She can call clinic at any time with any questions, concerns, or complaints.    Cancer Staging  Grade 1 follicular  lymphoma of lymph nodes of multiple regions [facility]) Staging form: Lymphoid Neoplasms, AJCC 6th Edition - Clinical stage from 08/27/2016: Stage IIE - Signed by Jeralyn Ruths, MD on 08/27/2016  Rectal cancer [facility]) Staging form: Colon and Rectum, AJCC 8th Edition - Clinical: Stage IVA Dewaine Oats, [place]) - Signed by Jeralyn Ruths, MD on 11/02/2020   Jeralyn Ruths, MD   11/27/2023 9:32 AM

## 2023-11-28 ENCOUNTER — Telehealth: Payer: Self-pay | Admitting: *Deleted

## 2023-11-28 LAB — T4: T4, Total: 8.7 ug/dL (ref 4.5–12.0)

## 2023-11-28 NOTE — Telephone Encounter (Signed)
The daughter called today saying that her mom was supposed to have some cough syrup and when she called the pharmacy they said there was not anything there yesterday.  I called over to the pharmacy and they said they do have it but her insurance does not cover it so it is going to be $46.96 .  The patient and the daughter are happy to go over and get the medicine for her.

## 2023-12-06 NOTE — Progress Notes (Unsigned)
Cardiology Office Note Date:  12/09/2023  Patient ID:  Veronica, Boyd Apr 20, 1946, MRN [id number] PCP:  Barbette Reichmann, MD  Cardiologist:  Julien Nordmann, MD Electrophysiologist: Nobie Putnam, MD (though has not met)    Chief Complaint: afib follow-up  History of Present Illness: Veronica Boyd is a 78 y.o. 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  I have seen her multiple times in the last 6 months for Afib. Amiodarone was started for afib w RVR. She obtains labwork with oncology every 2 weeks.   On follow-up today, she is doing well. She is eating well and has gained a couple pounds. She is walking more steadily, was able to walk around [employer] over the weekend while holding on to cart. She is not aware of any aFib episodes recently. She checks her pulse diligently at home, most readings 90-low 100s without variability.  She has not fallen recently, but does have some unsteadiness when standing after using the bathroom. She did recently stumble into the bathtub, but did not fall.  She recently completed immunotherapy for her lung nodules, recent CT showed increase in size of lung nodules. She sees Dr. Orlie Dakin in oncology regularly. She has an ongoing, nagging cough. Relieved with tussionex. Cough mostly bothers her at night.   She diligently takes eliquis 5mg  BID, has small amount of blood on toilet paper after BM, no excessive amounts of blood.    AAD History: Amiodarone    ROS:  Please see the history of present illness. All other systems are reviewed and otherwise negative.   PHYSICAL EXAM:  VS:  BP 129/80 (BP Location: Left Arm, Patient Position: Sitting, Cuff Size: Normal)   Pulse 96   Ht 4' (1.219 m)   Wt 86 lb 12.8 oz (39.4 kg)   SpO2 94%   BMI 26.49 kg/m  BMI: Body mass index is 26.49 kg/m.  Wt Readings from Last 3 Encounters:  12/09/23 86 lb 12.8 oz (39.4 kg)  11/27/23 88 lb  (39.9 kg)  11/19/23 88 lb (39.9 kg)    GEN- frail, thin appearing, alert and oriented x 3 today.   Lungs- CTA, diminished in RLL, normal work of breathing.  Heart- Regular rate and rhythm, no murmurs, rubs or gallops Extremities- No peripheral edema, warm, dry   EKG is ordered. Personal review of EKG from today shows:   EKG Interpretation Date/Time:  Monday December 09 2023 14:34:37 EST Ventricular Rate:  96 PR Interval:  202 QRS Duration:  72 QT Interval:  366 QTC Calculation: 462 R Axis:   1  Text Interpretation: Sinus rhythm with first degree AV block Confirmed by Sherie Don [phone]) on 12/09/2023 2:37:30 PM     Additional studies reviewed include: Previous EP, cardiology notes.   NM myocardial, 07/16/2023   Low risk, probably normal pharmacologic myocardial perfusion stress test.   There is a small in size, severe, fixed defect in the apical lateral segment most consistent with artifact but cannot rule out small area of scar.   No significant ischemia is identified.   Left ventricular systolic function is normal (LVEF greater than 65%).   Coronary artery calcification and aortic atherosclerosis are noted.  Central venous catheter extends into the distal SVC.   Pectus excavatum is evident.  Long term monitor, 06/10/2023 Monitor 1 Normal sinus rhythm Patient had a min HR of 50 bpm, max HR of 226 bpm, and avg HR of  102 bpm.  Atrial Flutter occurred (16% burden), ranging from 78-226 bpm (avg of 126 bpm), the longest lasting 31 mins 49 secs with an avg rate of 168 bpm.  Possible atrial tachycardia Isolated SVEs were frequent (7.0%, 42231), SVE Couplets were rare (<1.0%, 1946), and SVE Triplets were rare (<1.0%, 824).  Isolated VEs were rare (<1.0%), VE Couplets were rare (<1.0%), and no VE Triplets were present.    Monitor 2 Normal sinus rhythm Patient had a min HR of 48 bpm, max HR of 235 bpm, and avg HR of 100 bpm.   1 run of Ventricular Tachycardia occurred lasting 4 beats  with a max rate of 226 bpm (avg 202 bpm).   Atrial Fibrillation/Flutter occurred (15% burden), ranging from 66-235 bpm (avg of 115 bpm), the longest lasting 22 mins 56 secs with an avg rate of 102 bpm.  Possible atrial tachycardia.  Isolated SVEs were frequent (9.0%, [id number]), SVE Couplets were occasional (1.4%, 14036), and SVE Triplets were rare (<1.0%, 6711).  Isolated VEs were rare (<1.0%), VE Couplets were rare (<1.0%), and no VE Triplets were present.   TTE, 04/08/2023  1. Left ventricular ejection fraction, by estimation, is >55%. The left ventricle has normal function. Left ventricular endocardial border not optimally defined to evaluate regional wall motion. Left ventricular diastolic function could not be evaluated.   2. Right ventricular systolic function is normal. The right ventricular size is normal.   3. The mitral valve was not well visualized. No evidence of mitral valve  regurgitation.   4. Tricuspid valve regurgitation not well assessed.   5. The aortic valve was not well visualized. Aortic valve regurgitation not well assessed.   6. Pulmonic valve regurgitation not well assessed.   ASSESSMENT AND PLAN:  #) parox afib  #) high risk medication use - amiodarone Maintaining sinus rhythm on amiodarone 200mg  daily Recent LFTs stable  #) acquired hypothyroidism Recent TSH > 24 with normal t4 Likely due to amiodarone usage Will start synthroid daily Recommend she follow-up with PCP in 4-6 weeks for ongoing thyroid mgmt.  #) Hypercoag d/t parox afib #) frequent falls CHA2DS2-VASc Score = 4 [CHF History: 0, HTN History: 0, Diabetes History: 0, Stroke History: 0, Vascular Disease History: 1, Age Score: 2, Gender Score: 1].  Therefore, the patient's annual risk of stroke is 4.8 %.    Stroke ppx - Reduce eliquis to 2.5mg  BID, dose reduced for age, weight Continue to monitor falls, may need to stop OAC if continues to have falls  #) recurrent stage 4 rectal cancer #) lung  mets Continue to follow-up with Dr. Orlie Dakin Ongoing cough thought to be d/t lung mets, continue to closely monitor iso ongoing amiodarone use    Current medicines are reviewed at length with the patient today.   The patient does not have concerns regarding her medicines.  The following changes were made today:     Labs/ tests ordered today include:  Orders Placed This Encounter  Procedures   EKG 12-Lead     Disposition: Follow up with Dr. Jimmey Ralph or EP APP in 6 months   Signed, Sherie Don, NP  12/09/23  3:11 PM  Electrophysiology [facility]

## 2023-12-09 ENCOUNTER — Ambulatory Visit: Payer: 59 | Attending: Cardiology | Admitting: Cardiology

## 2023-12-09 VITALS — BP 129/80 | HR 96 | Ht <= 58 in | Wt 86.8 lb

## 2023-12-09 DIAGNOSIS — D6869 Other thrombophilia: Secondary | ICD-10-CM | POA: Diagnosis not present

## 2023-12-09 DIAGNOSIS — I480 Paroxysmal atrial fibrillation: Secondary | ICD-10-CM | POA: Diagnosis not present

## 2023-12-09 DIAGNOSIS — R918 Other nonspecific abnormal finding of lung field: Secondary | ICD-10-CM

## 2023-12-09 DIAGNOSIS — Z79899 Other long term (current) drug therapy: Secondary | ICD-10-CM

## 2023-12-09 DIAGNOSIS — C20 Malignant neoplasm of rectum: Secondary | ICD-10-CM

## 2023-12-09 DIAGNOSIS — E032 Hypothyroidism due to medicaments and other exogenous substances: Secondary | ICD-10-CM

## 2023-12-09 MED ORDER — LEVOTHYROXINE SODIUM 25 MCG PO TABS: 25.0000 ug | ORAL_TABLET | Freq: Every day | ORAL | 1 refills | Status: DC

## 2023-12-09 MED ORDER — APIXABAN 2.5 MG PO TABS: 2.5000 mg | ORAL_TABLET | Freq: Two times a day (BID) | ORAL | 3 refills | Status: DC

## 2023-12-09 NOTE — Patient Instructions (Addendum)
Medication Instructions:  - Your physician has recommended you make the following change in your medication:   1) DECREASE Eliquis to 2.5 mg: - take 1 tablet by mouth TWICE daily (or every 12 hours)  2) START Synthroid (Levothyroxine) 25 mcg: - take 1 tablet by mouth once daily in the morning before breakfast & 1 hour prior to other medications)   *If you need a refill on your cardiac medications before your next appointment, please call your pharmacy*   Lab Work: - none ordered  If you have labs (blood work) drawn today and your tests are completely normal, you will receive your results only by: MyChart Message (if you have MyChart) OR A paper copy in the mail If you have any lab test that is abnormal or we need to change your treatment, we will call you to review the results.   Testing/Procedures: - none ordered   Follow-Up: At [facility], you and your health needs are our priority.  As part of our continuing mission to provide you with exceptional heart care, we have created designated Provider Care Teams.  These Care Teams include your primary Cardiologist (physician) and Advanced Practice Providers (APPs -  Physician Assistants and Nurse Practitioners) who all work together to provide you with the care you need, when you need it.  We recommend signing up for the patient portal called "MyChart".  Sign up information is provided on this After Visit Summary.  MyChart is used to connect with patients for Virtual Visits (Telemedicine).  Patients are able to view lab/test results, encounter notes, upcoming appointments, etc.  Non-urgent messages can be sent to your provider as well.   To learn more about what you can do with MyChart, go to [email or web address].    Your next appointment:   1) Follow up with you Primary Care Physician in 4-6 week(s) for further management of hypothyroidism  2) 6 months with Dr. Johnnette Litter, NP  Provider:   As Above    Other  Instructions  Levothyroxine Tablets What is this medication? LEVOTHYROXINE (lee voe thye ROX een) treats low thyroid levels (hypothyroidism) in your body. It works by replacing a thyroid hormone normally made by the body. Thyroid hormones play an important role in your overall health. They help support metabolism and energy levels. This medicine may be used for other purposes; ask your health care provider or pharmacist if you have questions. COMMON BRAND NAME(S): Estre, Euthyrox, Levo-T, Levothroid, Levoxyl, Synthroid, Thyro-Tabs, Unithroid What should I tell my care team before I take this medication? They need to know if you have any of these conditions: Addison disease Adrenal gland problem Bone problems Chest pain Diabetes Dieting or on a weight loss program Fertility problems Heart disease Pituitary gland problem Take medications that treat or prevent blood clots An unusual or allergic reaction to levothyroxine, other medications, foods, dyes, or preservatives Pregnant or trying to get pregnant Breastfeeding How should I use this medication? Take this medication by mouth with plenty of water. It is best to take on an empty stomach, at least 30 minutes to one hour before breakfast. Avoid taking antacids containing aluminum or magnesium, simethicone, bile acid sequestrants, calcium carbonate, sodium polystyrene sulfonate, ferrous sulfate, sevelamer, lanthanum, or sucralfate within 4 hours of taking this medication. Follow the directions on the prescription label. Take at the same time each day. Do not take your medication more often than directed. Contact your care team regarding the use of this medication in children. While this  medication may be prescribed for children and infants as young as a few days of age for selected conditions, precautions do apply. For infants, you may crush the tablet and place in a small amount of (5 to 10 mL or 1 to 2 teaspoonfuls) of water, breast milk, or  non-soy based infant formula. Do not mix with soy-based infant formula. Give as directed. Overdosage: If you think you have taken too much of this medicine contact a poison control center or emergency room at once. NOTE: This medicine is only for you. Do not share this medicine with others. What if I miss a dose? If you miss a dose, take it as soon as you can. If it is almost time for your next dose, take only that dose. Do not take double or extra doses. What may interact with this medication? Amiodarone Antacids Calcium supplements Carbamazepine Certain medications for depression Certain medications to treat cancer Cholestyramine Clofibrate Colesevelam Colestipol Digoxin Estrogen or progestin hormones Iron supplements Ketamine Lanthanum Liquid nutrition products, such as Ensure Lithium Medications for colds and breathing difficulties Medications for diabetes Medications or dietary supplements for weight loss Methadone Niacin Orlistat Oxandrolone Phenobarbital or other barbiturates Phenytoin Rifampin Sevelamer Simethicone Sodium polystyrene sulfonate Soy isoflavones Steroid medications, such as prednisone or cortisone Sucralfate Testosterone Theophylline Thyroid hormones Warfarin This list may not describe all possible interactions. Give your health care provider a list of all the medicines, herbs, non-prescription drugs, or dietary supplements you use. Also tell them if you smoke, drink alcohol, or use illegal drugs. Some items may interact with your medicine. What should I watch for while using this medication? Visit your care team for regular checks on your progress. Tell your care team if your symptoms do not start to get better or if they get worse. It may be some time before you see the benefit from this medication. Do not switch brands of this medication unless your care team agrees with the change. Ask questions if you are uncertain. You may need blood work  done while you are taking this medication. Biotin (vitamin B7) may interfere with your thyroid function test. Stop taking supplements that contain biotin 2 days before your blood work. This medication can affect blood sugar levels. If you have diabetes, check your blood sugar as directed. What side effects may I notice from receiving this medication? Side effects that you should report to your care team as soon as possible: Allergic reactions--skin rash, itching, hives, swelling of the face, lips, tongue, or throat Anxiety, nervousness Excessive sweating or sensitivity to heat Fever Heart palpitations--rapid, pounding, or irregular heartbeat Heart rhythm changes--fast or irregular heartbeat, dizziness, feeling faint or lightheaded, chest pain, trouble breathing Irregular menstrual cycles or spotting Severe diarrhea Tremors or shaking Trouble sleeping Side effects that usually do not require medical attention (report to your care team if they continue or are bothersome): Changes in appetite Hair loss Headache Nausea Vomiting This list may not describe all possible side effects. Call your doctor for medical advice about side effects. You may report side effects to FDA at 1-800-FDA-1088. Where should I keep my medication? Keep out of the reach of children and pets. Store at room temperature between 15 and 30 degrees C (59 and 86 degrees F). Protect from light and moisture. Keep container tightly closed. Throw away any unused medication after the expiration date. NOTE: This sheet is a summary. It may not cover all possible information. If you have questions about this medicine, talk to your doctor,  pharmacist, or health care provider.  2024 [employer]/Gold Standard (2023-01-11 00:00:00)

## 2023-12-18 ENCOUNTER — Inpatient Hospital Stay: Payer: 59 | Attending: Oncology

## 2023-12-18 ENCOUNTER — Inpatient Hospital Stay: Payer: 59

## 2023-12-18 ENCOUNTER — Encounter: Payer: Self-pay | Admitting: Oncology

## 2023-12-18 ENCOUNTER — Other Ambulatory Visit: Payer: Self-pay | Admitting: *Deleted

## 2023-12-18 ENCOUNTER — Inpatient Hospital Stay (HOSPITAL_BASED_OUTPATIENT_CLINIC_OR_DEPARTMENT_OTHER): Payer: 59 | Admitting: Oncology

## 2023-12-18 VITALS — BP 133/80 | HR 110 | Temp 98.3°F | Wt 86.0 lb

## 2023-12-18 VITALS — BP 110/68 | HR 95 | Temp 98.6°F | Resp 19

## 2023-12-18 DIAGNOSIS — R918 Other nonspecific abnormal finding of lung field: Secondary | ICD-10-CM

## 2023-12-18 DIAGNOSIS — Z5112 Encounter for antineoplastic immunotherapy: Secondary | ICD-10-CM | POA: Insufficient documentation

## 2023-12-18 DIAGNOSIS — C20 Malignant neoplasm of rectum: Secondary | ICD-10-CM

## 2023-12-18 DIAGNOSIS — C7801 Secondary malignant neoplasm of right lung: Secondary | ICD-10-CM | POA: Insufficient documentation

## 2023-12-18 DIAGNOSIS — Z79899 Other long term (current) drug therapy: Secondary | ICD-10-CM | POA: Diagnosis not present

## 2023-12-18 LAB — CBC WITH DIFFERENTIAL (CANCER CENTER ONLY)
Abs Immature Granulocytes: 0.02 10*3/uL (ref 0.00–0.07)
Basophils Absolute: 0 10*3/uL (ref 0.0–0.1)
Basophils Relative: 0 %
Eosinophils Absolute: 0.1 10*3/uL (ref 0.0–0.5)
Eosinophils Relative: 1 %
HCT: 40.3 % (ref 36.0–46.0)
Hemoglobin: 13.1 g/dL (ref 12.0–15.0)
Immature Granulocytes: 0 %
Lymphocytes Relative: 27 %
Lymphs Abs: 1.7 10*3/uL (ref 0.7–4.0)
MCH: 32.6 pg (ref 26.0–34.0)
MCHC: 32.5 g/dL (ref 30.0–36.0)
MCV: 100.2 fL — ABNORMAL HIGH (ref 80.0–100.0)
Monocytes Absolute: 0.9 10*3/uL (ref 0.1–1.0)
Monocytes Relative: 14 %
Neutro Abs: 3.8 10*3/uL (ref 1.7–7.7)
Neutrophils Relative %: 58 %
Platelet Count: 196 10*3/uL (ref 150–400)
RBC: 4.02 MIL/uL (ref 3.87–5.11)
RDW: 13.4 % (ref 11.5–15.5)
WBC Count: 6.6 10*3/uL (ref 4.0–10.5)
nRBC: 0 % (ref 0.0–0.2)

## 2023-12-18 LAB — CMP (CANCER CENTER ONLY)
ALT: 15 U/L (ref 0–44)
AST: 21 U/L (ref 15–41)
Albumin: 3.6 g/dL (ref 3.5–5.0)
Alkaline Phosphatase: 55 U/L (ref 38–126)
Anion gap: 9 (ref 5–15)
BUN: 18 mg/dL (ref 8–23)
CO2: 23 mmol/L (ref 22–32)
Calcium: 9.2 mg/dL (ref 8.9–10.3)
Chloride: 104 mmol/L (ref 98–111)
Creatinine: 0.72 mg/dL (ref 0.44–1.00)
GFR, Estimated: >60 mL/min (ref >60)
Glucose, Bld: 121 mg/dL — ABNORMAL HIGH (ref 70–99)
Potassium: 3.8 mmol/L (ref 3.5–5.1)
Sodium: 136 mmol/L (ref 135–145)
Total Bilirubin: 0.7 mg/dL (ref 0.0–1.2)
Total Protein: 6.9 g/dL (ref 6.5–8.1)

## 2023-12-18 MED ORDER — SODIUM CHLORIDE 0.9 % IV SOLN
INTRAVENOUS | Status: DC
  Filled 2023-12-18: qty 250

## 2023-12-18 MED ORDER — SODIUM CHLORIDE 0.9 % IV SOLN
200.0000 mg | Freq: Once | INTRAVENOUS | Status: AC
  Administered 2023-12-18: 200 mg via INTRAVENOUS
  Filled 2023-12-18: qty 200

## 2023-12-18 MED ORDER — HEPARIN SOD (PORK) LOCK FLUSH 100 UNIT/ML IV SOLN
500.0000 [IU] | Freq: Once | INTRAVENOUS | Status: AC | PRN
  Administered 2023-12-18: 500 [IU]
  Filled 2023-12-18: qty 5

## 2023-12-18 MED ORDER — HYDROCOD POLI-CHLORPHE POLI ER 10-8 MG/5ML PO SUER: 5.0000 mL | Freq: Two times a day (BID) | ORAL | 0 refills | Status: DC | PRN

## 2023-12-18 NOTE — Patient Instructions (Signed)

## 2023-12-18 NOTE — Progress Notes (Signed)
[facility]  [phone] [phone]  ID: Veronica Boyd OB: 09-10-1946  MR#: [id number]  WNU#:[id number]  Patient Care Team: Barbette Reichmann, MD as PCP - General (Internal Medicine) Antonieta Iba, MD as PCP - Cardiology (Cardiology) Nobie Putnam, MD as PCP - Electrophysiology (Cardiology) Jeralyn Ruths, MD as Consulting Physician (Oncology) Benita Gutter, RN as Oncology Nurse Navigator Salena Saner, MD as Consulting Physician (Pulmonary Disease)  CHIEF COMPLAINT: Recurrent stage IV rectal cancer, history of follicular lymphoma.  INTERVAL HISTORY: Patient returns to clinic today for further evaluation and consideration of cycle 2 of Keytruda.  She tolerated her first treatment well without significant side effects.  She continues to have chronic cough.  She has a good appetite and denies weight loss.  Her peripheral neuropathy is unchanged.  She has no other neurologic complaints. She denies any recent fevers or illnesses.  She has no chest pain, shortness of breath, or hemoptysis.  She denies any nausea, vomiting, constipation or diarrhea.  She has no urinary complaints.  Patient offers no further specific complaints today.  REVIEW OF SYSTEMS:   Review of Systems  Constitutional:  Positive for malaise/fatigue. Negative for fever and weight loss.  Respiratory:  Positive for cough. Negative for hemoptysis and shortness of breath.   Cardiovascular: Negative.  Negative for chest pain, palpitations and leg swelling.  Gastrointestinal:  Negative for abdominal pain, blood in stool, diarrhea, melena, nausea and vomiting.  Genitourinary: Negative.  Negative for dysuria.  Musculoskeletal: Negative.  Negative for back pain, joint pain and neck pain.  Skin: Negative.  Negative for rash.  Neurological:  Positive for tingling and sensory change. Negative for focal weakness, weakness and headaches.  Psychiatric/Behavioral: Negative.  The  patient is not nervous/anxious and does not have insomnia.     As per HPI. Otherwise, a complete review of systems is negative.   PAST MEDICAL HISTORY: Past Medical History:  Diagnosis Date   Anemia due to antineoplastic chemotherapy    Aortic atherosclerosis (HCC)    Arthritis    Atrial fibrillation and flutter (HCC)    a.) 04/2023 Zio: Monitor 1 - 16 % aflutter burden w/ avg rate of 168, (78-226), possible Atach. Freq PACs, rare PVCs. Monitor 2 - 15% afib/flutter burden @ 115 [id number]). Poss Atach. 4 beats NSVT. Freq PACs, rare PVCs; b.)  CHA2DS2-VASc = 4 (age x2, sex, vascular disease history); c.) cardiac rate/rhythm maintained on oral metoprolol succinate; chronically anticoagulated using apixaban   Bronchial obstruction    Chemotherapy-induced neuropathy (HCC)    Chicken pox    Colon polyp    Coronary artery calcification seen on CT scan    a. 11/2022 CT Chest: Coronary artery calcification and aortic atherosclerosis.   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 04/2023 Echo: EF >55%, nl RV size/fxn.  No significant valvular disease observed.   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, cN2a, cM1a)   Thrombocytopenia (HCC)     PAST SURGICAL HISTORY: Past Surgical History:  Procedure Laterality Date   AXILLARY LYMPH NODE DISSECTION  Right 08/21/2016   Procedure: AXILLARY LYMPH NODE excision;  Surgeon: Nadeen Landau, MD;  Location: [facility] ORS;  Service: General;  Laterality: Right;   CATARACT EXTRACTION, BILATERAL Bilateral    COLONOSCOPY N/A 09/19/2020   Procedure: COLONOSCOPY;  Surgeon: Regis Bill, MD;  Location: [facility]  ENDOSCOPY;  Service: Endoscopy;  Laterality: N/A;   ENDOBRONCHIAL ULTRASOUND Left 08/05/2023   Procedure: ENDOBRONCHIAL ULTRASOUND;  Surgeon: Salena Saner, MD;  Location: [facility] ORS;  Service: Pulmonary;  Laterality: Left;   PORTA CATH INSERTION N/A 09/16/2017   Procedure: PORTA CATH INSERTION;  Surgeon: Annice Needy, MD;  Location: [facility] INVASIVE CV LAB;  Service: Cardiovascular;  Laterality: N/A;   TONSILLECTOMY     TOTAL HIP ARTHROPLASTY Left 1992    FAMILY HISTORY: Family History  Problem Relation Age of Onset   Heart failure Mother    Emphysema Father    Diabetes Sister    Lung cancer Brother    Diabetes Brother    Breast cancer Paternal Aunt    Basal cell carcinoma Daughter     ADVANCED DIRECTIVES (Y/N):  N  HEALTH MAINTENANCE: Social History   Tobacco Use   Smoking status: Never   Smokeless tobacco: Never  Vaping Use   Vaping status: Never Used  Substance Use Topics   Alcohol use: No   Drug use: No     Colonoscopy:  PAP:  Bone density:  Lipid panel:  No Known Allergies  Current Outpatient Medications  Medication Sig Dispense Refill   amiodarone (PACERONE) 200 MG tablet Take 1 tablet (200 mg) by mouth twice daily for 2 weeks, then decrease to 1 tablet (200 mg) by mouth daily. 90 tablet 3   apixaban (ELIQUIS) 2.5 MG TABS tablet Take 1 tablet (2.5 mg total) by mouth 2 (two) times daily. 180 tablet 3   chlorpheniramine-HYDROcodone (TUSSIONEX) 10-8 MG/5ML Take 5 mLs by mouth every 12 (twelve) hours as needed for cough. 115 mL 0   diclofenac sodium (VOLTAREN) 1 % GEL Apply 2 g topically 2 (two) times daily as needed.     diphenoxylate-atropine (LOMOTIL) 2.5-0.025 MG tablet TAKE 2 TABLETS BY MOUTH 4 (FOUR) TIMES DAILY AS NEEDED FOR DIARRHEA OR LOOSE STOOLS. 60 tablet 1   gabapentin (NEURONTIN) 300 MG capsule TAKE 1 CAPSULE BY MOUTH TWICE A DAY 60 capsule 2   KLOR-CON M20 20 MEQ tablet TAKE 1 TABLET BY MOUTH TWICE A DAY 180 tablet 1   levothyroxine (SYNTHROID) 25  MCG tablet Take 1 tablet (25 mcg total) by mouth daily before breakfast. 30 tablet 1   lidocaine-prilocaine (EMLA) cream Apply 1 Application topically as needed. Apply to port 1 hour prior to use as needed 30 g 2   megestrol (MEGACE) 40 MG tablet TAKE 1 TABLET BY MOUTH EVERY DAY 90 tablet 1   pantoprazole (PROTONIX) 20 MG tablet Take 1 tablet by mouth daily as needed for heartburn.     simvastatin (ZOCOR) 20 MG tablet Take 20 mg by mouth at bedtime.     traMADol-acetaminophen (ULTRACET) 37.5-325 MG tablet Take 1 tablet by mouth every 8 (eight) hours as needed.     No current facility-administered medications for this visit.   Facility-Administered Medications Ordered in Other Visits  Medication Dose Route Frequency Provider Last Rate Last Admin   heparin lock flush 100 unit/mL  500 Units Intravenous Once Jeralyn Ruths, MD       heparin lock flush 100 unit/mL  500 Units Intracatheter PRN Orlie Dakin Tollie Pizza, MD  heparin lock flush 100 unit/mL  500 Units Intravenous Once Jeralyn Ruths, MD       heparin lock flush 100 unit/mL  500 Units Intravenous Once Jeralyn Ruths, MD       sodium chloride flush (NS) 0.9 % injection 10 mL  10 mL Intravenous PRN Jeralyn Ruths, MD   10 mL at 12/04/17 0901   sodium chloride flush (NS) 0.9 % injection 10 mL  10 mL Intracatheter PRN Jeralyn Ruths, MD       sodium chloride flush (NS) 0.9 % injection 10 mL  10 mL Intravenous PRN Jeralyn Ruths, MD   10 mL at 04/24/21 0839   yttrium-90 injection 22.3 millicurie  22.3 millicurie Intravenous Once Chrystal, Sherrine Maples, MD        OBJECTIVE: Vitals:   12/18/23 0944  BP: 133/80  Pulse: (!) 110  Temp: 98.3 F (36.8 C)  SpO2: 96%       Body mass index is 26.24 kg/m.    ECOG FS:1 - Symptomatic but completely ambulatory  General: Well-developed, well-nourished, no acute distress. Eyes: Pink conjunctiva, anicteric sclera. HEENT: Normocephalic, moist mucous membranes. Lungs: No  audible wheezing or coughing. Heart: Regular rate and rhythm. Abdomen: Soft, nontender, no obvious distention. Musculoskeletal: No edema, cyanosis, or clubbing. Neuro: Alert, answering all questions appropriately. Cranial nerves grossly intact. Skin: No rashes or petechiae noted. Psych: Normal affect.   LAB RESULTS:  Lab Results  Component Value Date   NA 140 11/27/2023   K 3.7 11/27/2023   CL 108 11/27/2023   CO2 21 (L) 11/27/2023   GLUCOSE 128 (H) 11/27/2023   BUN 12 11/27/2023   CREATININE 0.60 11/27/2023   CALCIUM 9.2 11/27/2023   PROT 6.5 11/27/2023   ALBUMIN 3.6 11/27/2023   AST 24 11/27/2023   ALT 15 11/27/2023   ALKPHOS 56 11/27/2023   BILITOT 0.5 11/27/2023   GFRNONAA >60 11/27/2023   GFRAA >60 06/28/2020    Lab Results  Component Value Date   WBC 5.9 11/27/2023   NEUTROABS 3.5 11/27/2023   HGB 12.2 11/27/2023   HCT 38.8 11/27/2023   MCV 105.1 (H) 11/27/2023   PLT 171 11/27/2023     STUDIES: No results found.   ONCOLOGY HISTORY:  Biopsy confirmed rectal cancer.  MRI completed at [facility] on October 11, 2020 revealed extension of the tumor beyond the wall into the rectovaginal recess with suspected invasion of the vaginal wall posteriorly.  Tumor also appears to involve the internal anal sphincter.  There are also numerous prominent presacral and mesorectal lymph nodes highly suspicious for malignancy.  PET scan results from November 17, 2020 reviewed independently with 3 hypermetabolic right lower lobe lung nodules consistent with pulmonary metastasis.  Patient completed cycle 8 of FOLFOX on Mar 29, 2021.  She then completed cycle 5 of her 5-FU pump on May 26, 2021 and XRT on June 01, 2021.  PET scan results from July 17, 2021 reviewed independently with no evidence of disease other than enlarging right lower lobe lung lesion.  Patient completed SBRT to the right lung lesion on August 22, 2021.  Repeat PET scan on July 25, 2021 revealed minimal  residual disease in the rectum and likely resolution of lesions in the lung.    ASSESSMENT: Recurrent stage IV rectal cancer, history of follicular lymphoma.  PLAN:    Recurrent stage IV rectal cancer: See oncology history as above.  PET scan results from Mar 21, 2023 reviewed independently with clear progression  of disease in patient's pelvis as well as multiple pulmonary nodules.  Her CEA initially increased to 29.8, trended down to 7.2, but has trended back up slightly to 9.5.  Hospice and end-of-life care were previously discussed, but patient preferred to continue to pursue treatment.  CT scan results from November 11, 2023 reviewed independently and reported as above with significant improvement of disease burden except for her lung masses which have increased in size.  Will discontinue FOLFIRI plus Avastin.  Proceed with cycle 2 of single agent Keytruda today.  Return to clinic in 3 weeks for further evaluation and consideration of cycle 3.  Plan to reimage after cycle 4.    Left lower lobe lung lesion: Increasing in size despite improvement of disease elsewhere.  Previously, biopsy from bronchoscopy did not reveal any evidence of malignancy.  Circular gene testing was positive for PD-L1 expression as well as ATM and CHEK2 mutations.  Imaging as above with increase in size of both left and right lung lesions.  Given the PD-L1 positivity, patient has agreed to pursue treatment with single agent Keytruda every 3 weeks as above. Recurrent follicular lymphoma: CT scan results from December 23, 2019 reviewed independently with no obvious evidence of recurrent or progressive disease.  PET scan results as above consistent with rectal cancer metastasis and no evidence of lymphoma.   Patient received Zevalin on July 30, 2017 with not much therapeutic effect.  She subsequently underwent cycles 5 of R-CHOP chemotherapy with Neulasta support completing on December 12, 2017.  Patient continues to be in complete  remission.  Neuropathy: Chronic and unchanged. Pelvic pain: Patient does not complain of this today.  Continue tramadol as needed. Poor appetite/weight loss: Resolved.  Continue Megace as prescribed. Hypokalemia: Resolved.  Continue oral potassium supplementation. Coping/adjustment: Patient was previously given a referral to [facility]. Atrial fibrillation: Appreciate cardiology input.  Patient now on amiodarone. Pelvic fracture: No interval needed at this time.  Continue symptomatic treatment with tramadol as above. Cough: Patient was given a refill of Tussionex. Elevated TSH: Patient reports primary care has adjusted her Synthroid dosing.  I spent a total of 30 minutes reviewing chart data, face-to-face evaluation with the patient, counseling and coordination of care as detailed above.   Patient expressed understanding and was in agreement with this plan. She also understands that She can call clinic at any time with any questions, concerns, or complaints.    Cancer Staging  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 [facility]) Staging form: Lymphoid Neoplasms, AJCC 6th Edition - Clinical stage from 08/27/2016: Stage IIE - Signed by Jeralyn Ruths, MD on 08/27/2016  Rectal cancer [facility]) Staging form: Colon and Rectum, AJCC 8th Edition - Clinical: Stage IVA Dewaine Oats, [place]) - Signed by Jeralyn Ruths, MD on 11/02/2020   Jeralyn Ruths, MD   12/18/2023 10:20 AM

## 2023-12-24 ENCOUNTER — Other Ambulatory Visit: Payer: Self-pay

## 2023-12-30 ENCOUNTER — Other Ambulatory Visit: Payer: Self-pay

## 2023-12-31 ENCOUNTER — Other Ambulatory Visit: Payer: Self-pay | Admitting: Cardiology

## 2024-01-08 ENCOUNTER — Inpatient Hospital Stay: Payer: 59

## 2024-01-08 ENCOUNTER — Inpatient Hospital Stay (HOSPITAL_BASED_OUTPATIENT_CLINIC_OR_DEPARTMENT_OTHER): Payer: 59 | Admitting: Oncology

## 2024-01-08 ENCOUNTER — Encounter: Payer: Self-pay | Admitting: Oncology

## 2024-01-08 ENCOUNTER — Inpatient Hospital Stay: Payer: 59 | Attending: Oncology

## 2024-01-08 VITALS — BP 118/72 | HR 94 | Temp 99.3°F | Resp 17

## 2024-01-08 VITALS — BP 115/69 | HR 98 | Temp 99.3°F | Resp 16 | Ht <= 58 in | Wt 88.0 lb

## 2024-01-08 DIAGNOSIS — C20 Malignant neoplasm of rectum: Secondary | ICD-10-CM | POA: Diagnosis present

## 2024-01-08 DIAGNOSIS — C7801 Secondary malignant neoplasm of right lung: Secondary | ICD-10-CM | POA: Insufficient documentation

## 2024-01-08 DIAGNOSIS — Z5112 Encounter for antineoplastic immunotherapy: Secondary | ICD-10-CM | POA: Insufficient documentation

## 2024-01-08 DIAGNOSIS — R918 Other nonspecific abnormal finding of lung field: Secondary | ICD-10-CM

## 2024-01-08 DIAGNOSIS — Z79620 Long term (current) use of immunosuppressive biologic: Secondary | ICD-10-CM | POA: Diagnosis not present

## 2024-01-08 LAB — CBC WITH DIFFERENTIAL (CANCER CENTER ONLY)
Abs Immature Granulocytes: 0.03 10*3/uL (ref 0.00–0.07)
Basophils Absolute: 0 10*3/uL (ref 0.0–0.1)
Basophils Relative: 0 %
Eosinophils Absolute: 0.1 10*3/uL (ref 0.0–0.5)
Eosinophils Relative: 1 %
HCT: 38.6 % (ref 36.0–46.0)
Hemoglobin: 12.4 g/dL (ref 12.0–15.0)
Immature Granulocytes: 0 %
Lymphocytes Relative: 24 %
Lymphs Abs: 1.7 10*3/uL (ref 0.7–4.0)
MCH: 31.6 pg (ref 26.0–34.0)
MCHC: 32.1 g/dL (ref 30.0–36.0)
MCV: 98.5 fL (ref 80.0–100.0)
Monocytes Absolute: 1 10*3/uL (ref 0.1–1.0)
Monocytes Relative: 15 %
Neutro Abs: 4.2 10*3/uL (ref 1.7–7.7)
Neutrophils Relative %: 60 %
Platelet Count: 239 10*3/uL (ref 150–400)
RBC: 3.92 MIL/uL (ref 3.87–5.11)
RDW: 13.9 % (ref 11.5–15.5)
WBC Count: 7 10*3/uL (ref 4.0–10.5)
nRBC: 0 % (ref 0.0–0.2)

## 2024-01-08 LAB — CMP (CANCER CENTER ONLY)
ALT: 17 U/L (ref 0–44)
AST: 21 U/L (ref 15–41)
Albumin: 3.5 g/dL (ref 3.5–5.0)
Alkaline Phosphatase: 54 U/L (ref 38–126)
Anion gap: 6 (ref 5–15)
BUN: 15 mg/dL (ref 8–23)
CO2: 22 mmol/L (ref 22–32)
Calcium: 8.7 mg/dL — ABNORMAL LOW (ref 8.9–10.3)
Chloride: 104 mmol/L (ref 98–111)
Creatinine: 0.71 mg/dL (ref 0.44–1.00)
GFR, Estimated: >60 mL/min (ref >60)
Glucose, Bld: 112 mg/dL — ABNORMAL HIGH (ref 70–99)
Potassium: 4 mmol/L (ref 3.5–5.1)
Sodium: 132 mmol/L — ABNORMAL LOW (ref 135–145)
Total Bilirubin: 0.6 mg/dL (ref 0.0–1.2)
Total Protein: 6.9 g/dL (ref 6.5–8.1)

## 2024-01-08 LAB — TSH: TSH: 11.665 u[IU]/mL — ABNORMAL HIGH (ref 0.350–4.500)

## 2024-01-08 MED ORDER — SODIUM CHLORIDE 0.9 % IV SOLN
INTRAVENOUS | Status: DC
  Filled 2024-01-08: qty 250

## 2024-01-08 MED ORDER — SODIUM CHLORIDE 0.9% FLUSH
10.0000 mL | INTRAVENOUS | Status: DC | PRN
  Filled 2024-01-08: qty 10

## 2024-01-08 MED ORDER — HEPARIN SOD (PORK) LOCK FLUSH 100 UNIT/ML IV SOLN
500.0000 [IU] | Freq: Once | INTRAVENOUS | Status: AC | PRN
  Administered 2024-01-08: 500 [IU]
  Filled 2024-01-08: qty 5

## 2024-01-08 MED ORDER — SODIUM CHLORIDE 0.9 % IV SOLN
200.0000 mg | Freq: Once | INTRAVENOUS | Status: AC
  Administered 2024-01-08: 200 mg via INTRAVENOUS
  Filled 2024-01-08: qty 200

## 2024-01-08 NOTE — Progress Notes (Signed)
 [facility]  [phone] [phone]  ID: Veronica Boyd OB: 05/22/1946  MR#: [id number]  AOZ#:[id number]  Patient Care Team: Barbette Reichmann, MD as PCP - General (Internal Medicine) Antonieta Iba, MD as PCP - Cardiology (Cardiology) Nobie Putnam, MD as PCP - Electrophysiology (Cardiology) Jeralyn Ruths, MD as Consulting Physician (Oncology) Benita Gutter, RN as Oncology Nurse Navigator Salena Saner, MD as Consulting Physician (Pulmonary Disease)  CHIEF COMPLAINT: Recurrent stage IV rectal cancer, history of follicular lymphoma.  INTERVAL HISTORY: Patient returns to clinic today for further evaluation and consideration of cycle 3 of Keytruda.  She is tolerating her treatment without significant side effects.  She continues to have a chronic cough, but otherwise feels well.  She has a good appetite and denies weight loss.  Her peripheral neuropathy is unchanged.  She has no other neurologic complaints. She denies any recent fevers or illnesses.  She has no chest pain, shortness of breath, or hemoptysis.  She denies any nausea, vomiting, constipation or diarrhea.  She has no urinary complaints.  Patient offers no further specific complaints today.  REVIEW OF SYSTEMS:   Review of Systems  Constitutional:  Positive for malaise/fatigue. Negative for fever and weight loss.  Respiratory:  Positive for cough. Negative for hemoptysis and shortness of breath.   Cardiovascular: Negative.  Negative for chest pain, palpitations and leg swelling.  Gastrointestinal:  Negative for abdominal pain, blood in stool, diarrhea, melena, nausea and vomiting.  Genitourinary: Negative.  Negative for dysuria.  Musculoskeletal: Negative.  Negative for back pain, joint pain and neck pain.  Skin: Negative.  Negative for rash.  Neurological:  Positive for tingling and sensory change. Negative for focal weakness, weakness and headaches.   Psychiatric/Behavioral: Negative.  The patient is not nervous/anxious and does not have insomnia.     As per HPI. Otherwise, a complete review of systems is negative.   PAST MEDICAL HISTORY: Past Medical History:  Diagnosis Date   Anemia due to antineoplastic chemotherapy    Aortic atherosclerosis (HCC)    Arthritis    Atrial fibrillation and flutter (HCC)    a.) 04/2023 Zio: Monitor 1 - 16 % aflutter burden w/ avg rate of 168, (78-226), possible Atach. Freq PACs, rare PVCs. Monitor 2 - 15% afib/flutter burden @ 115 [phone] Poss Atach. 4 beats NSVT. Freq PACs, rare PVCs; b.)  CHA2DS2-VASc = 4 (age x2, sex, vascular disease history); c.) cardiac rate/rhythm maintained on oral metoprolol succinate; chronically anticoagulated using apixaban   Bronchial obstruction    Chemotherapy-induced neuropathy (HCC)    Chicken pox    Colon polyp    Coronary artery calcification seen on CT scan    a. 11/2022 CT Chest: Coronary artery calcification and aortic atherosclerosis.   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 04/2023 Echo: EF >55%, nl RV size/fxn.  No significant valvular disease observed.   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, cN2a, cM1a)   Thrombocytopenia (HCC)     PAST SURGICAL HISTORY: Past Surgical History:  Procedure Laterality Date  AXILLARY LYMPH NODE DISSECTION Right 08/21/2016   Procedure: AXILLARY LYMPH NODE excision;  Surgeon: Nadeen Landau, MD;  Location: [facility] ORS;  Service: General;  Laterality: Right;   CATARACT EXTRACTION, BILATERAL Bilateral    COLONOSCOPY N/A 09/19/2020   Procedure: COLONOSCOPY;  Surgeon:  Regis Bill, MD;  Location: [facility] ENDOSCOPY;  Service: Endoscopy;  Laterality: N/A;   ENDOBRONCHIAL ULTRASOUND Left 08/05/2023   Procedure: ENDOBRONCHIAL ULTRASOUND;  Surgeon: Salena Saner, MD;  Location: [facility] ORS;  Service: Pulmonary;  Laterality: Left;   PORTA CATH INSERTION N/A 09/16/2017   Procedure: PORTA CATH INSERTION;  Surgeon: Annice Needy, MD;  Location: [facility] INVASIVE CV LAB;  Service: Cardiovascular;  Laterality: N/A;   TONSILLECTOMY     TOTAL HIP ARTHROPLASTY Left 1992    FAMILY HISTORY: Family History  Problem Relation Age of Onset   Heart failure Mother    Emphysema Father    Diabetes Sister    Lung cancer Brother    Diabetes Brother    Breast cancer Paternal Aunt    Basal cell carcinoma Daughter     ADVANCED DIRECTIVES (Y/N):  N  HEALTH MAINTENANCE: Social History   Tobacco Use   Smoking status: Never   Smokeless tobacco: Never  Vaping Use   Vaping status: Never Used  Substance Use Topics   Alcohol use: No   Drug use: No     Colonoscopy:  PAP:  Bone density:  Lipid panel:  No Known Allergies  Current Outpatient Medications  Medication Sig Dispense Refill   amiodarone (PACERONE) 200 MG tablet Take 1 tablet (200 mg) by mouth twice daily for 2 weeks, then decrease to 1 tablet (200 mg) by mouth daily. 90 tablet 3   apixaban (ELIQUIS) 2.5 MG TABS tablet Take 1 tablet (2.5 mg total) by mouth 2 (two) times daily. 180 tablet 3   chlorpheniramine-HYDROcodone (TUSSIONEX) 10-8 MG/5ML Take 5 mLs by mouth every 12 (twelve) hours as needed for cough. 115 mL 0   diclofenac sodium (VOLTAREN) 1 % GEL Apply 2 g topically 2 (two) times daily as needed.     diphenoxylate-atropine (LOMOTIL) 2.5-0.025 MG tablet TAKE 2 TABLETS BY MOUTH 4 (FOUR) TIMES DAILY AS NEEDED FOR DIARRHEA OR LOOSE STOOLS. 60 tablet 1   gabapentin (NEURONTIN) 300 MG capsule TAKE 1 CAPSULE BY MOUTH TWICE A DAY 60 capsule 2   KLOR-CON M20 20 MEQ tablet TAKE 1 TABLET BY MOUTH TWICE A DAY 180  tablet 1   levothyroxine (SYNTHROID) 25 MCG tablet Take 1 tablet (25 mcg total) by mouth daily before breakfast. 30 tablet 1   lidocaine-prilocaine (EMLA) cream Apply 1 Application topically as needed. Apply to port 1 hour prior to use as needed 30 g 2   megestrol (MEGACE) 40 MG tablet TAKE 1 TABLET BY MOUTH EVERY DAY 90 tablet 1   metoprolol succinate (TOPROL-XL) 25 MG 24 hr tablet Take 25 mg by mouth daily.     pantoprazole (PROTONIX) 20 MG tablet Take 1 tablet by mouth daily as needed for heartburn.     simvastatin (ZOCOR) 20 MG tablet Take 20 mg by mouth at bedtime.     traMADol-acetaminophen (ULTRACET) 37.5-325 MG tablet Take 1 tablet by mouth every 8 (eight) hours as needed.     No current facility-administered medications for this visit.   Facility-Administered Medications Ordered in Other Visits  Medication Dose Route Frequency Provider Last Rate Last Admin   0.9 %  sodium chloride infusion   Intravenous Continuous Orlie Dakin, Tollie Pizza, MD  Stopped at 01/08/24 1134   heparin lock flush 100 unit/mL  500 Units Intravenous Once Jeralyn Ruths, MD       heparin lock flush 100 unit/mL  500 Units Intracatheter PRN Jeralyn Ruths, MD       heparin lock flush 100 unit/mL  500 Units Intravenous Once Orlie Dakin, Tollie Pizza, MD       heparin lock flush 100 unit/mL  500 Units Intravenous Once Jeralyn Ruths, MD       sodium chloride flush (NS) 0.9 % injection 10 mL  10 mL Intravenous PRN Jeralyn Ruths, MD   10 mL at 12/04/17 0901   sodium chloride flush (NS) 0.9 % injection 10 mL  10 mL Intracatheter PRN Jeralyn Ruths, MD       sodium chloride flush (NS) 0.9 % injection 10 mL  10 mL Intravenous PRN Jeralyn Ruths, MD   10 mL at 04/24/21 0839   sodium chloride flush (NS) 0.9 % injection 10 mL  10 mL Intracatheter PRN Jeralyn Ruths, MD       yttrium-90 injection 22.3 millicurie  22.3 millicurie Intravenous Once Carmina Miller, MD        OBJECTIVE: Vitals:    01/08/24 0936  BP: 115/69  Pulse: 98  Resp: 16  Temp: 99.3 F (37.4 C)  SpO2: 95%       Body mass index is 18.39 kg/m.    ECOG FS:1 - Symptomatic but completely ambulatory  General: Well-developed, well-nourished, no acute distress. Eyes: Pink conjunctiva, anicteric sclera. HEENT: Normocephalic, moist mucous membranes. Lungs: No audible wheezing or coughing. Heart: Regular rate and rhythm. Abdomen: Soft, nontender, no obvious distention. Musculoskeletal: No edema, cyanosis, or clubbing. Neuro: Alert, answering all questions appropriately. Cranial nerves grossly intact. Skin: No rashes or petechiae noted. Psych: Normal affect.  LAB RESULTS:  Lab Results  Component Value Date   NA 132 (L) 01/08/2024   K 4.0 01/08/2024   CL 104 01/08/2024   CO2 22 01/08/2024   GLUCOSE 112 (H) 01/08/2024   BUN 15 01/08/2024   CREATININE 0.71 01/08/2024   CALCIUM 8.7 (L) 01/08/2024   PROT 6.9 01/08/2024   ALBUMIN 3.5 01/08/2024   AST 21 01/08/2024   ALT 17 01/08/2024   ALKPHOS 54 01/08/2024   BILITOT 0.6 01/08/2024   GFRNONAA >60 01/08/2024   GFRAA >60 06/28/2020    Lab Results  Component Value Date   WBC 7.0 01/08/2024   NEUTROABS 4.2 01/08/2024   HGB 12.4 01/08/2024   HCT 38.6 01/08/2024   MCV 98.5 01/08/2024   PLT 239 01/08/2024     STUDIES: No results found.   ONCOLOGY HISTORY:  Biopsy confirmed rectal cancer.  MRI completed at [facility] on October 11, 2020 revealed extension of the tumor beyond the wall into the rectovaginal recess with suspected invasion of the vaginal wall posteriorly.  Tumor also appears to involve the internal anal sphincter.  There are also numerous prominent presacral and mesorectal lymph nodes highly suspicious for malignancy.  PET scan results from November 17, 2020 reviewed independently with 3 hypermetabolic right lower lobe lung nodules consistent with pulmonary metastasis.  Patient completed cycle 8 of FOLFOX on Mar 29, 2021.  She then  completed cycle 5 of her 5-FU pump on May 26, 2021 and XRT on June 01, 2021.  PET scan results from July 17, 2021 reviewed independently with no evidence of disease other than enlarging right lower lobe lung lesion.  Patient completed SBRT to the  right lung lesion on August 22, 2021.  Repeat PET scan on July 25, 2021 revealed minimal residual disease in the rectum and likely resolution of lesions in the lung.    ASSESSMENT: Recurrent stage IV rectal cancer, history of follicular lymphoma.  PLAN:    Recurrent stage IV rectal cancer: See oncology history as above.  PET scan results from Mar 21, 2023 reviewed independently with clear progression of disease in patient's pelvis as well as multiple pulmonary nodules.  Her CEA initially increased to 29.8, trended down to 7.2, but has trended back up slightly to 9.5.  Hospice and end-of-life care were previously discussed, but patient preferred to continue to pursue treatment.  CT scan results from November 11, 2023 reviewed independently and reported as above with significant improvement of disease burden except for her lung masses which have increased in size.  Patient last received FOLFIRI and Avastin on October 16, 2023.  Proceed with cycle 3 of single agent Keytruda today.  Return to clinic in 3 weeks for further evaluation and consideration of cycle 4.  Plan to reimage after cycle 4.    Left lower lobe lung lesion: Increasing in size despite improvement of disease elsewhere.  Previously, biopsy from bronchoscopy did not reveal any evidence of malignancy.  Circular gene testing was positive for PD-L1 expression as well as ATM and CHEK2 mutations.  Imaging as above with increase in size of both left and right lung lesions.  Given the PD-L1 positivity, patient has agreed to pursue treatment with single agent Keytruda every 3 weeks as above. Recurrent follicular lymphoma: CT scan results from December 23, 2019 reviewed independently with no obvious  evidence of recurrent or progressive disease.  PET scan results as above consistent with rectal cancer metastasis and no evidence of lymphoma.   Patient received Zevalin on July 30, 2017 with not much therapeutic effect.  She subsequently underwent cycles 5 of R-CHOP chemotherapy with Neulasta support completing on December 12, 2017.  Patient continues to be in complete remission.  Neuropathy: Chronic and unchanged. Pelvic pain: Patient does not complain of this today.  Continue tramadol as needed. Poor appetite/weight loss: Resolved.  Continue Megace as prescribed. Hypokalemia: Resolved.  Continue oral potassium supplementation. Coping/adjustment: Patient was previously given a referral to [facility]. Atrial fibrillation: Appreciate cardiology input.  Patient now on amiodarone. Pelvic fracture: No interval needed at this time.  Continue symptomatic treatment with tramadol as above. Cough: Continue Tussionex as prescribed. Elevated TSH: Improving.  Patient reports primary care is adjusting her Synthroid dosing.    I spent a total of 30 minutes reviewing chart data, face-to-face evaluation with the patient, counseling and coordination of care as detailed above.  Patient expressed understanding and was in agreement with this plan. She also understands that She can call clinic at any time with any questions, concerns, or complaints.    Cancer Staging  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 [facility]) Staging form: Lymphoid Neoplasms, AJCC 6th Edition - Clinical stage from 08/27/2016: Stage IIE - Signed by Jeralyn Ruths, MD on 08/27/2016  Rectal cancer [facility]) Staging form: Colon and Rectum, AJCC 8th Edition - Clinical: Stage IVA Dewaine Oats, [place]) - Signed by Jeralyn Ruths, MD on 11/02/2020   Jeralyn Ruths, MD   01/08/2024 11:58 AM

## 2024-01-08 NOTE — Patient Instructions (Signed)
 [facility] - A DEPT OF [facility]  Discharge Instructions: Thank you for choosing [facility] to provide your oncology and hematology care.  If you have a lab appointment with the Cancer Center, please go directly to the Cancer Center and check in at the registration area.  Wear comfortable clothing and clothing appropriate for easy access to any Portacath or PICC line.   We strive to give you quality time with your provider. You may need to reschedule your appointment if you arrive late (15 or more minutes).  Arriving late affects you and other patients whose appointments are after yours.  Also, if you miss three or more appointments without notifying the office, you may be dismissed from the clinic at the provider's discretion.      For prescription refill requests, have your pharmacy contact our office and allow 72 hours for refills to be completed.    Today you received the following chemotherapy and/or immunotherapy agents Rande Lawman      To help prevent nausea and vomiting after your treatment, we encourage you to take your nausea medication as directed.  BELOW ARE SYMPTOMS THAT SHOULD BE REPORTED IMMEDIATELY: *FEVER GREATER THAN 100.4 F (38 C) OR HIGHER *CHILLS OR SWEATING *NAUSEA AND VOMITING THAT IS NOT CONTROLLED WITH YOUR NAUSEA MEDICATION *UNUSUAL SHORTNESS OF BREATH *UNUSUAL BRUISING OR BLEEDING *URINARY PROBLEMS (pain or burning when urinating, or frequent urination) *BOWEL PROBLEMS (unusual diarrhea, constipation, pain near the anus) TENDERNESS IN MOUTH AND THROAT WITH OR WITHOUT PRESENCE OF ULCERS (sore throat, sores in mouth, or a toothache) UNUSUAL RASH, SWELLING OR PAIN  UNUSUAL VAGINAL DISCHARGE OR ITCHING   Items with * indicate a potential emergency and should be followed up as soon as possible or go to the Emergency Department if any problems should occur.  Please show the CHEMOTHERAPY ALERT CARD or IMMUNOTHERAPY  ALERT CARD at check-in to the Emergency Department and triage nurse.  Should you have questions after your visit or need to cancel or reschedule your appointment, please contact [facility] - A DEPT OF [facility]  [phone] and follow the prompts.  Office hours are 8:00 a.m. to 4:30 p.m. Monday - Friday. Please note that voicemails left after 4:00 p.m. may not be returned until the following business day.  We are closed weekends and major holidays. You have access to a nurse at all times for urgent questions. Please call the main number to the clinic [phone] and follow the prompts.  For any non-urgent questions, you may also contact your provider using MyChart. We now offer e-Visits for anyone 48 and older to request care online for non-urgent symptoms. For details visit [email or web address].   Also download the MyChart app! Go to the app store, search "MyChart", open the app, select [place], and log in with your MyChart username and password.

## 2024-01-09 ENCOUNTER — Other Ambulatory Visit: Payer: Self-pay | Admitting: Oncology

## 2024-01-09 ENCOUNTER — Ambulatory Visit (INDEPENDENT_AMBULATORY_CARE_PROVIDER_SITE_OTHER): Payer: 59 | Admitting: Pulmonary Disease

## 2024-01-09 ENCOUNTER — Encounter: Payer: Self-pay | Admitting: Pulmonary Disease

## 2024-01-09 VITALS — BP 106/60 | HR 104 | Temp 97.8°F | Ht <= 58 in | Wt 87.6 lb

## 2024-01-09 DIAGNOSIS — C7800 Secondary malignant neoplasm of unspecified lung: Secondary | ICD-10-CM | POA: Diagnosis not present

## 2024-01-09 DIAGNOSIS — C20 Malignant neoplasm of rectum: Secondary | ICD-10-CM | POA: Diagnosis not present

## 2024-01-09 DIAGNOSIS — I480 Paroxysmal atrial fibrillation: Secondary | ICD-10-CM | POA: Diagnosis not present

## 2024-01-09 DIAGNOSIS — R053 Chronic cough: Secondary | ICD-10-CM | POA: Diagnosis not present

## 2024-01-09 LAB — T4: T4, Total: 11.6 ug/dL (ref 4.5–12.0)

## 2024-01-09 MED ORDER — ARNUITY ELLIPTA 100 MCG/ACT IN AEPB: 1.0000 | INHALATION_SPRAY | Freq: Every day | RESPIRATORY_TRACT | 6 refills | Status: DC

## 2024-01-09 MED ORDER — HYDROCOD POLI-CHLORPHE POLI ER 10-8 MG/5ML PO SUER: 5.0000 mL | Freq: Two times a day (BID) | ORAL | 0 refills | Status: DC | PRN

## 2024-01-09 NOTE — Progress Notes (Signed)
 Subjective:    Patient ID: Veronica Boyd, female    DOB: Mar 22, 1946, 78 y.o.   MRN: [id number]  Patient Care Team: Barbette Reichmann, MD as PCP - General (Internal Medicine) Antonieta Iba, MD as PCP - Cardiology (Cardiology) Nobie Putnam, MD as PCP - Electrophysiology (Cardiology) Jeralyn Ruths, MD as Consulting Physician (Oncology) Benita Gutter, RN as Oncology Nurse Navigator Salena Saner, MD as Consulting Physician (Pulmonary Disease)  Chief Complaint  Patient presents with   Follow-up    Shortness of breath on exertion. Cough.     BACKGROUND/INTERVAL: Veronica Boyd is a 78 year old lifelong never smoker who follows up for pulmonary nodules in the setting of stage IV rectal cancer and a history of follicular lymphoma.  She also has a chronic cough.  We last saw the patient on 08 October 2023.  This is a scheduled follow-up visit.  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  HPI Discussed the use of AI scribe software for clinical note transcription with the patient, who gave verbal consent to proceed.  History of Present Illness   Veronica BISCHOFF "Mae" is an 78 year old female with colorectal cancer who presents with lung nodules.  The lung nodules are being monitored due to mutations and growth despite previous chemotherapy.  Her cough, which has been persistent, has worsened over the past month and a half, especially at night. The cough became more pronounced around the time she started immunotherapy. She uses Tussionex at night to help manage her cough and improve sleep. She has had difficulty using inhalers in the past but is open to trying a new type that requires only 1 inhalation daily. No wheezing is associated with the cough.  Previously her cough was ICS responsive  She has a history of heart issues, specifically episodes of  her heart racing, but reports that this has calmed down. She is under the care of multiple doctors, including a cardiologist, and is on a regimen of medications, though specific details of her cardiac medications were not discussed.      Review of Systems A 10 point review of systems was performed and it is as noted above otherwise negative.   Patient Active Problem List   Diagnosis Date Noted   Paroxysmal atrial fibrillation (HCC) 07/02/2023   Hypercoagulable state due to paroxysmal atrial fibrillation (HCC) 07/02/2023   Lung mass 10/05/2022   Bronchial obstruction 10/05/2022   Chemotherapy-induced neuropathy (HCC) 07/21/2021   Rectal cancer (HCC) 11/02/2020   Rectal mass 10/11/2020   Gastroesophageal reflux disease without esophagitis 08/25/2018   Protein-calorie malnutrition, severe 12/20/2017   Anemia    Thrombocytopenia (HCC)    Sepsis (HCC) 12/19/2017   Anemia due to antineoplastic chemotherapy 12/17/2017   Goals of care, counseling/discussion 09/19/2017   Axillary mass, right 04/03/2017   Pure hypercholesterolemia 09/10/2016   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) 07/24/2016   Mass of right chest wall 06/06/2016   Primary osteoarthritis involving multiple joints 12/14/2015   Transient insomnia 08/27/2015   Mixed hyperlipidemia 03/01/2015   Nocturnal muscle cramps 03/01/2015   Osteoporosis 11/08/2014   Primary osteoarthritis of both knees 07/05/2014   Hyperlipidemia 03/19/2014   Osteoarthritis of knee 03/19/2014   Abnormal mammogram 03/03/2013   Vitamin D deficiency 05/09/2012    Social History   Tobacco Use  Smoking status: Never   Smokeless tobacco: Never  Substance Use Topics   Alcohol use: No    No Known Allergies  Current Meds  Medication Sig   amiodarone (PACERONE) 200 MG tablet Take 1 tablet (200 mg) by mouth twice daily for 2 weeks, then decrease to 1 tablet (200 mg) by mouth  daily.   apixaban (ELIQUIS) 2.5 MG TABS tablet Take 1 tablet (2.5 mg total) by mouth 2 (two) times daily.   diclofenac sodium (VOLTAREN) 1 % GEL Apply 2 g topically 2 (two) times daily as needed.   diphenoxylate-atropine (LOMOTIL) 2.5-0.025 MG tablet TAKE 2 TABLETS BY MOUTH 4 (FOUR) TIMES DAILY AS NEEDED FOR DIARRHEA OR LOOSE STOOLS.   Fluticasone Furoate (ARNUITY ELLIPTA) 100 MCG/ACT AEPB Inhale 1 puff into the lungs daily.   KLOR-CON M20 20 MEQ tablet TAKE 1 TABLET BY MOUTH TWICE A DAY   levothyroxine (SYNTHROID) 25 MCG tablet Take 1 tablet (25 mcg total) by mouth daily before breakfast.   lidocaine-prilocaine (EMLA) cream Apply 1 Application topically as needed. Apply to port 1 hour prior to use as needed   megestrol (MEGACE) 40 MG tablet TAKE 1 TABLET BY MOUTH EVERY DAY   metoprolol succinate (TOPROL-XL) 25 MG 24 hr tablet Take 25 mg by mouth daily.   pantoprazole (PROTONIX) 20 MG tablet Take 1 tablet by mouth daily as needed for heartburn.   simvastatin (ZOCOR) 20 MG tablet Take 20 mg by mouth at bedtime.   traMADol-acetaminophen (ULTRACET) 37.5-325 MG tablet Take 1 tablet by mouth every 8 (eight) hours as needed.   [DISCONTINUED] chlorpheniramine-HYDROcodone (TUSSIONEX) 10-8 MG/5ML Take 5 mLs by mouth every 12 (twelve) hours as needed for cough.   [DISCONTINUED] gabapentin (NEURONTIN) 300 MG capsule TAKE 1 CAPSULE BY MOUTH TWICE A DAY    Immunization History  Administered Date(s) Administered   Influenza-Unspecified 09/20/2014   PFIZER(Purple Top)SARS-COV-2 Vaccination 01/07/2020, 02/02/2020   Unspecified SARS-COV-2 Vaccination 02/02/2020   Zoster Recombinant(Shingrix) 11/29/2019, 03/20/2020   Zoster, Live 09/20/2014        Objective:     BP 106/60 (BP Location: Left Arm, Patient Position: Sitting, Cuff Size: Normal)   Pulse (!) 104   Temp 97.8 F (36.6 C) (Temporal)   Ht 4\' 10"  (1.473 m)   Wt 87 lb 9.6 oz (39.7 kg)   SpO2 96%   BMI 18.31 kg/m   SpO2: 96 %  GENERAL:  Thin, elderly female, pale, presents in transport chair.  No conversational dyspnea. HEAD: Normocephalic, atraumatic.  EYES: Pupils equal, round, reactive to light.  No scleral icterus.  MOUTH: Wearing mask today. NECK: Supple. No thyromegaly. Trachea midline. No JVD.  No adenopathy. PULMONARY: Pectus excavatum, good air entry bilaterally.  No adventitious sounds. CARDIOVASCULAR: S1 and S2.  Regular rate and rhythm.  No rubs, murmurs or gallops heard. ABDOMEN: Scaphoid otherwise benign. MUSCULOSKELETAL: No joint deformity, no clubbing, no edema.  NEUROLOGIC: Grossly nonfocal.  Fully ambulatory.  Speech is fluent. SKIN: Intact,warm,dry. PSYCH: Mood and behavior normal.      Assessment & Plan:     ICD-10-CM   1. Chronic cough  R05.3     2. 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 Paroxysmal atrial fibrillation (HCC)  I48.0       Meds ordered this encounter  Medications   Fluticasone Furoate (ARNUITY ELLIPTA) 100 MCG/ACT AEPB    Sig: Inhale 1 puff into the lungs daily.    Dispense:  30 each  Refill:  6   Discussion:  Lung nodules in the setting of colorectal cancer Lung nodules are monitored due to colorectal cancer. She is undergoing immunotherapy because of mutations and nodule growth despite chemotherapy. A CT scan is planned after the fourth immunotherapy session to assess response. Immunotherapy may cause pneumonitis, presenting as a cough. - Continue immunotherapy as scheduled - Order CT scan after the fourth immunotherapy session to assess response  Chronic cough Chronic cough has worsened, especially at night, coinciding with the start of immunotherapy. The cough is not associated with wheezing. Concern for pneumonitis as a side effect of immunotherapy exists, but systemic steroids are avoided to prevent interference with treatment. An inhaler with a mild steroid is considered to manage the cough without affecting immunotherapy.  She previously  responded to ICS. - Prescribe inhaler with mild steroid (Arnuity Ellipta), 1 puff daily. - Instruct to rinse mouth after inhaler use with water and a small amount of baking soda - Continue using cough syrup as needed for sleep - Provide education on inhaler use  Cardiac arrhythmia Episodes of tachypalpitations, currently well-managed. No recent episodes reported.     Advised if symptoms do not improve or worsen, to please contact office for sooner follow up or seek emergency care.    I spent 32 minutes of dedicated to the care of this patient on the date of this encounter to include pre-visit review of records, face-to-face time with the patient discussing conditions above, post visit ordering of testing, clinical documentation with the electronic health record, making appropriate referrals as documented, and communicating necessary findings to members of the patients care team.     C. Danice Goltz, MD Advanced Bronchoscopy [facility]    *This note was generated using voice recognition software/Dragon and/or AI transcription program.  Despite best efforts to proofread, errors can occur which can change the meaning. Any transcriptional errors that result from this process are unintentional and may not be fully corrected at the time of dictation.

## 2024-01-09 NOTE — Patient Instructions (Signed)
 VISIT SUMMARY:  Veronica Boyd, an 78 year old female with colorectal cancer, visited today due to lung nodules and a worsening chronic cough. The lung nodules are being monitored, and she is currently undergoing immunotherapy. Her chronic cough has worsened, especially at night, and there is a concern for pneumonitis as a side effect of the immunotherapy. She also has a history of heart issues, which are currently well-managed.  YOUR PLAN:  -LUNG NODULES IN THE SETTING OF COLORECTAL CANCER: Lung nodules are small masses of tissue in the lungs that are being monitored due to their growth and mutations despite previous chemotherapy. You are undergoing immunotherapy to address these nodules. A CT scan will be done after your fourth immunotherapy session to see how well the treatment is working. Please continue with your immunotherapy as scheduled.  -CHRONIC COUGH: A chronic cough is a cough that lasts for a long time. Your cough has worsened, especially at night, and may be a side effect of your immunotherapy. To help manage this, you will be prescribed an inhaler with a mild steroid to use once daily (Arnuity 100). Please rinse your mouth with water and a small amount of baking soda after using the inhaler. Continue using your cough syrup as needed for sleep. We will also provide you with education on how to use the inhaler properly.  -CARDIAC ARRHYTHMIA: Cardiac arrhythmia is an irregular heartbeat. You have had episodes of your heart racing in the past, but these are currently well-managed and you have not had any recent episodes.  INSTRUCTIONS:  Please continue with your immunotherapy as scheduled and use the prescribed inhaler twice daily. Rinse your mouth with water and a small amount of baking soda after each use. Continue using your cough syrup as needed for sleep. A CT scan will be scheduled after your fourth immunotherapy session to assess your response to the treatment. If you experience  any new or worsening symptoms, please contact our office immediately.  For more information, you can read your full clinical note, available in your patient portal.

## 2024-01-10 ENCOUNTER — Other Ambulatory Visit: Payer: Self-pay

## 2024-01-22 ENCOUNTER — Other Ambulatory Visit: Payer: Self-pay | Admitting: Oncology

## 2024-01-27 ENCOUNTER — Encounter: Payer: Self-pay | Admitting: Pulmonary Disease

## 2024-01-29 ENCOUNTER — Inpatient Hospital Stay: Payer: 59

## 2024-01-29 ENCOUNTER — Inpatient Hospital Stay (HOSPITAL_BASED_OUTPATIENT_CLINIC_OR_DEPARTMENT_OTHER): Payer: 59 | Admitting: Oncology

## 2024-01-29 ENCOUNTER — Encounter: Payer: Self-pay | Admitting: Oncology

## 2024-01-29 VITALS — BP 121/61 | HR 91 | Temp 97.2°F | Resp 18

## 2024-01-29 VITALS — BP 125/68 | HR 97 | Temp 97.5°F | Resp 16 | Ht <= 58 in | Wt 88.8 lb

## 2024-01-29 DIAGNOSIS — R918 Other nonspecific abnormal finding of lung field: Secondary | ICD-10-CM

## 2024-01-29 DIAGNOSIS — C20 Malignant neoplasm of rectum: Secondary | ICD-10-CM | POA: Diagnosis not present

## 2024-01-29 DIAGNOSIS — Z5112 Encounter for antineoplastic immunotherapy: Secondary | ICD-10-CM | POA: Diagnosis not present

## 2024-01-29 LAB — CBC WITH DIFFERENTIAL (CANCER CENTER ONLY)
Abs Immature Granulocytes: 0.04 10*3/uL (ref 0.00–0.07)
Basophils Absolute: 0 10*3/uL (ref 0.0–0.1)
Basophils Relative: 1 %
Eosinophils Absolute: 0.2 10*3/uL (ref 0.0–0.5)
Eosinophils Relative: 2 %
HCT: 38.2 % (ref 36.0–46.0)
Hemoglobin: 12.2 g/dL (ref 12.0–15.0)
Immature Granulocytes: 1 %
Lymphocytes Relative: 28 %
Lymphs Abs: 2.3 10*3/uL (ref 0.7–4.0)
MCH: 31.4 pg (ref 26.0–34.0)
MCHC: 31.9 g/dL (ref 30.0–36.0)
MCV: 98.5 fL (ref 80.0–100.0)
Monocytes Absolute: 1 10*3/uL (ref 0.1–1.0)
Monocytes Relative: 12 %
Neutro Abs: 4.8 10*3/uL (ref 1.7–7.7)
Neutrophils Relative %: 56 %
Platelet Count: 273 10*3/uL (ref 150–400)
RBC: 3.88 MIL/uL (ref 3.87–5.11)
RDW: 14.1 % (ref 11.5–15.5)
WBC Count: 8.4 10*3/uL (ref 4.0–10.5)
nRBC: 0 % (ref 0.0–0.2)

## 2024-01-29 LAB — CMP (CANCER CENTER ONLY)
ALT: 14 U/L (ref 0–44)
AST: 22 U/L (ref 15–41)
Albumin: 3.4 g/dL — ABNORMAL LOW (ref 3.5–5.0)
Alkaline Phosphatase: 56 U/L (ref 38–126)
Anion gap: 10 (ref 5–15)
BUN: 14 mg/dL (ref 8–23)
CO2: 21 mmol/L — ABNORMAL LOW (ref 22–32)
Calcium: 8.7 mg/dL — ABNORMAL LOW (ref 8.9–10.3)
Chloride: 104 mmol/L (ref 98–111)
Creatinine: 0.88 mg/dL (ref 0.44–1.00)
GFR, Estimated: >60 mL/min (ref >60)
Glucose, Bld: 124 mg/dL — ABNORMAL HIGH (ref 70–99)
Potassium: 3.5 mmol/L (ref 3.5–5.1)
Sodium: 135 mmol/L (ref 135–145)
Total Bilirubin: 0.4 mg/dL (ref 0.0–1.2)
Total Protein: 6.8 g/dL (ref 6.5–8.1)

## 2024-01-29 MED ORDER — SODIUM CHLORIDE 0.9 % IV SOLN
200.0000 mg | Freq: Once | INTRAVENOUS | Status: AC
  Administered 2024-01-29: 200 mg via INTRAVENOUS
  Filled 2024-01-29: qty 8

## 2024-01-29 MED ORDER — SODIUM CHLORIDE 0.9 % IV SOLN
INTRAVENOUS | Status: DC
Start: 2024-01-29 — End: 2024-01-29
  Filled 2024-01-29: qty 250

## 2024-01-29 MED ORDER — HEPARIN SOD (PORK) LOCK FLUSH 100 UNIT/ML IV SOLN
500.0000 [IU] | Freq: Once | INTRAVENOUS | Status: AC | PRN
  Administered 2024-01-29: 500 [IU]
  Filled 2024-01-29: qty 5

## 2024-01-29 NOTE — Progress Notes (Signed)
 [facility]  [phone] [phone]  ID: Veronica Boyd OB: Mar 06, 1946  MR#: [id number]  QMV#:[id number]  Patient Care Team: Barbette Reichmann, MD as PCP - General (Internal Medicine) Antonieta Iba, MD as PCP - Cardiology (Cardiology) Nobie Putnam, MD as PCP - Electrophysiology (Cardiology) Jeralyn Ruths, MD as Consulting Physician (Oncology) Benita Gutter, RN as Oncology Nurse Navigator Salena Saner, MD as Consulting Physician (Pulmonary Disease)  CHIEF COMPLAINT: Recurrent stage IV rectal cancer, history of follicular lymphoma.  INTERVAL HISTORY: Patient returns to clinic today for further evaluation and consideration of cycle 4 of Keytruda.  She continues to have a chronic cough, but otherwise feels well.  She has chronic weakness and fatigue.  She is tolerating her treatments without significant side effects.  She has a good appetite and denies weight loss.  Her peripheral neuropathy is unchanged.  She has no other neurologic complaints. She denies any recent fevers or illnesses.  She has no chest pain, shortness of breath, or hemoptysis.  She denies any nausea, vomiting, constipation or diarrhea.  She has no urinary complaints.  Patient offers no further specific complaints today.  REVIEW OF SYSTEMS:   Review of Systems  Constitutional:  Positive for malaise/fatigue. Negative for fever and weight loss.  Respiratory:  Positive for cough. Negative for hemoptysis and shortness of breath.   Cardiovascular: Negative.  Negative for chest pain, palpitations and leg swelling.  Gastrointestinal:  Negative for abdominal pain, blood in stool, diarrhea, melena, nausea and vomiting.  Genitourinary: Negative.  Negative for dysuria.  Musculoskeletal: Negative.  Negative for back pain, joint pain and neck pain.  Skin: Negative.  Negative for rash.  Neurological:  Positive for tingling and sensory change. Negative for focal weakness,  weakness and headaches.  Psychiatric/Behavioral: Negative.  The patient is not nervous/anxious and does not have insomnia.     As per HPI. Otherwise, a complete review of systems is negative.   PAST MEDICAL HISTORY: Past Medical History:  Diagnosis Date   Anemia due to antineoplastic chemotherapy    Aortic atherosclerosis (HCC)    Arthritis    Atrial fibrillation and flutter (HCC)    a.) 04/2023 Zio: Monitor 1 - 16 % aflutter burden w/ avg rate of 168, (78-226), possible Atach. Freq PACs, rare PVCs. Monitor 2 - 15% afib/flutter burden @ 115 [phone]). Poss Atach. 4 beats NSVT. Freq PACs, rare PVCs; b.)  CHA2DS2-VASc = 4 (age x2, sex, vascular disease history); c.) cardiac rate/rhythm maintained on oral metoprolol succinate; chronically anticoagulated using apixaban   Bronchial obstruction    Chemotherapy-induced neuropathy (HCC)    Chicken pox    Colon polyp    Coronary artery calcification seen on CT scan    a. 11/2022 CT Chest: Coronary artery calcification and aortic atherosclerosis.   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 04/2023 Echo: EF >55%, nl RV size/fxn.  No significant valvular disease observed.   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, cN2a, cM1a)   Thrombocytopenia (HCC)     PAST SURGICAL HISTORY: Past Surgical  History:  Procedure Laterality Date   AXILLARY LYMPH NODE DISSECTION Right 08/21/2016   Procedure: AXILLARY LYMPH NODE excision;  Surgeon: Nadeen Landau, MD;  Location: [facility] ORS;  Service: General;  Laterality: Right;   CATARACT EXTRACTION, BILATERAL Bilateral    COLONOSCOPY N/A 09/19/2020   Procedure:  COLONOSCOPY;  Surgeon: Regis Bill, MD;  Location: [facility] ENDOS[facility];  Service: Endoscopy;  Laterality: N/A;   ENDOBRONCHIAL ULTRASOUND Left 08/05/2023   Procedure: ENDOBRONCHIAL ULTRASOUND;  Surgeon: Salena Saner, MD;  Location: [facility] ORS;  Service: Pulmonary;  Laterality: Left;   PORTA CATH INSERTION N/A 09/16/2017   Procedure: PORTA CATH INSERTION;  Surgeon: Annice Needy, MD;  Location: [facility] INVASIVE CV LAB;  Service: Cardiovascular;  Laterality: N/A;   TONSILLECTOMY     TOTAL HIP ARTHROPLASTY Left 1992    FAMILY HISTORY: Family History  Problem Relation Age of Onset   Heart failure Mother    Emphysema Father    Diabetes Sister    Lung cancer Brother    Diabetes Brother    Breast cancer Paternal Aunt    Basal cell carcinoma Daughter     ADVANCED DIRECTIVES (Y/N):  N  HEALTH MAINTENANCE: Social History   Tobacco Use   Smoking status: Never   Smokeless tobacco: Never  Vaping Use   Vaping status: Never Used  Substance Use Topics   Alcohol use: No   Drug use: No     Colonoscopy:  PAP:  Bone density:  Lipid panel:  No Known Allergies  Current Outpatient Medications  Medication Sig Dispense Refill   amiodarone (PACERONE) 200 MG tablet Take 1 tablet (200 mg) by mouth twice daily for 2 weeks, then decrease to 1 tablet (200 mg) by mouth daily. 90 tablet 3   apixaban (ELIQUIS) 2.5 MG TABS tablet Take 1 tablet (2.5 mg total) by mouth 2 (two) times daily. 180 tablet 3   chlorpheniramine-HYDROcodone (TUSSIONEX) 10-8 MG/5ML Take 5 mLs by mouth every 12 (twelve) hours as needed for cough. 115 mL 0   diclofenac sodium (VOLTAREN) 1 % GEL Apply 2 g topically 2 (two) times daily as needed.     diphenoxylate-atropine (LOMOTIL) 2.5-0.025 MG tablet TAKE 2 TABLETS BY MOUTH 4 (FOUR) TIMES DAILY AS NEEDED FOR DIARRHEA OR LOOSE STOOLS. 60 tablet 1   Fluticasone Furoate (ARNUITY ELLIPTA) 100 MCG/ACT AEPB Inhale 1 puff into the lungs daily. 30 each 6   gabapentin (NEURONTIN) 300 MG  capsule TAKE 1 CAPSULE BY MOUTH TWICE A DAY 60 capsule 2   KLOR-CON M20 20 MEQ tablet TAKE 1 TABLET BY MOUTH TWICE A DAY 180 tablet 1   levothyroxine (SYNTHROID) 25 MCG tablet Take 1 tablet (25 mcg total) by mouth daily before breakfast. 30 tablet 1   lidocaine-prilocaine (EMLA) cream Apply 1 Application topically as needed. Apply to port 1 hour prior to use as needed 30 g 2   megestrol (MEGACE) 40 MG tablet TAKE 1 TABLET BY MOUTH EVERY DAY 90 tablet 1   metoprolol succinate (TOPROL-XL) 25 MG 24 hr tablet Take 25 mg by mouth daily.     pantoprazole (PROTONIX) 20 MG tablet Take 1 tablet by mouth daily as needed for heartburn.     simvastatin (ZOCOR) 20 MG tablet Take 20 mg by mouth at bedtime.     traMADol-acetaminophen (ULTRACET) 37.5-325 MG tablet Take 1 tablet by mouth every 8 (eight) hours as needed.     No current facility-administered medications for this visit.   Facility-Administered Medications Ordered in Other Visits  Medication Dose Route Frequency Provider Last Rate Last Admin   heparin lock flush 100 unit/mL  500 Units Intravenous Once Orlie Dakin, Tollie Pizza, MD       heparin lock flush 100 unit/mL  500 Units Intracatheter PRN Orlie Dakin, Tollie Pizza, MD       heparin lock flush 100 unit/mL  500 Units Intravenous Once Orlie Dakin, Tollie Pizza, MD       heparin lock flush 100 unit/mL  500 Units Intravenous Once Jeralyn Ruths, MD       sodium chloride flush (NS) 0.9 % injection 10 mL  10 mL Intravenous PRN Jeralyn Ruths, MD   10 mL at 12/04/17 0901   sodium chloride flush (NS) 0.9 % injection 10 mL  10 mL Intracatheter PRN Jeralyn Ruths, MD       sodium chloride flush (NS) 0.9 % injection 10 mL  10 mL Intravenous PRN Jeralyn Ruths, MD   10 mL at 04/24/21 0839   yttrium-90 injection 22.3 millicurie  22.3 millicurie Intravenous Once Chrystal, Sherrine Maples, MD        OBJECTIVE: Vitals:   01/29/24 0910  BP: 125/68  Pulse: 97  Resp: 16  Temp: (!) 97.5 F (36.4 C)  SpO2: 95%        Body mass index is 18.56 kg/m.    ECOG FS:1 - Symptomatic but completely ambulatory  General: Well-developed, well-nourished, no acute distress. Eyes: Pink conjunctiva, anicteric sclera. HEENT: Normocephalic, moist mucous membranes. Lungs: No audible wheezing or coughing. Heart: Regular rate and rhythm. Abdomen: Soft, nontender, no obvious distention. Musculoskeletal: No edema, cyanosis, or clubbing. Neuro: Alert, answering all questions appropriately. Cranial nerves grossly intact. Skin: No rashes or petechiae noted. Psych: Normal affect.  LAB RESULTS:  Lab Results  Component Value Date   NA 132 (L) 01/08/2024   K 4.0 01/08/2024   CL 104 01/08/2024   CO2 22 01/08/2024   GLUCOSE 112 (H) 01/08/2024   BUN 15 01/08/2024   CREATININE 0.71 01/08/2024   CALCIUM 8.7 (L) 01/08/2024   PROT 6.9 01/08/2024   ALBUMIN 3.5 01/08/2024   AST 21 01/08/2024   ALT 17 01/08/2024   ALKPHOS 54 01/08/2024   BILITOT 0.6 01/08/2024   GFRNONAA >60 01/08/2024   GFRAA >60 06/28/2020    Lab Results  Component Value Date   WBC 8.4 01/29/2024   NEUTROABS 4.8 01/29/2024   HGB 12.2 01/29/2024   HCT 38.2 01/29/2024   MCV 98.5 01/29/2024   PLT 273 01/29/2024     STUDIES: No results found.   ONCOLOGY HISTORY:  Biopsy confirmed rectal cancer.  [facility] completed [facility] on October 11, 2020 revealed extension of the tumor beyond the wall into the rectovaginal recess with suspected invasion of the vaginal wall posteriorly.  Tumor also appears to involve the internal anal sphincter.  There are also numerous prominent presacral and mesorectal lymph nodes highly suspicious for malignancy.  PET scan results from November 17, 2020 reviewed independently with 3 hypermetabolic right lower lobe lung nodules consistent with pulmonary metastasis.  Patient completed cycle 8 of FOLFOX on Mar 29, 2021.  She then completed cycle 5 of her 5-FU pump on May 26, 2021 and XRT on June 01, 2021.  PET scan  results from July 17, 2021 reviewed independently with no evidence of disease other than enlarging right lower lobe lung lesion.  Patient completed SBRT to the right lung lesion on August 22, 2021.  Repeat PET scan on July 25, 2021 revealed minimal residual  disease in the rectum and likely resolution of lesions in the lung.    ASSESSMENT: Recurrent stage IV rectal cancer, history of follicular lymphoma.  PLAN:    Recurrent stage IV rectal cancer: See oncology history as above.  PET scan results from Mar 21, 2023 reviewed independently with clear progression of disease in patient's pelvis as well as multiple pulmonary nodules.  Her CEA initially increased to 29.8, trended down to 7.2, but has trended back up slightly to 9.5.  Hospice and end-of-life care were previously discussed, but patient preferred to continue to pursue treatment.  CT scan results from November 11, 2023 reviewed independently and reported as above with significant improvement of disease burden except for her lung masses which have increased in size.  Patient last received FOLFIRI and Avastin on October 16, 2023.  Proceed with cycle 4 of single agent Keytruda today.  Return to clinic in 3 weeks for further evaluation and consideration of cycle 5.  Will reimage with CT scan prior to next cycle.   Left lower lobe lung lesion: Increasing in size despite improvement of disease elsewhere.  Previously, biopsy from bronchoscopy did not reveal any evidence of malignancy.  Circular gene testing was positive for PD-L1 expression as well as ATM and CHEK2 mutations.  Imaging as above with increase in size of both left and right lung lesions.  Given the PD-L1 positivity, patient has agreed to pursue treatment with single agent Keytruda every 3 weeks as above. Recurrent follicular lymphoma: CT scan results from December 23, 2019 reviewed independently with no obvious evidence of recurrent or progressive disease.  PET scan results as above  consistent with rectal cancer metastasis and no evidence of lymphoma.   Patient received Zevalin on July 30, 2017 with not much therapeutic effect.  She subsequently underwent cycles 5 of R-CHOP chemotherapy with Neulasta support completing on December 12, 2017.  Patient continues to be in complete remission.  Neuropathy: Chronic and unchanged. Pelvic pain: Patient does not complain of this today.  Continue tramadol as needed. Poor appetite/weight loss: Resolved.  Continue Megace as prescribed. Hypokalemia: Resolved.  Continue oral potassium supplementation. Coping/adjustment: Patient was previously given a referral to [facility]. Atrial fibrillation: Appreciate cardiology input.  Patient now on amiodarone. Pelvic fracture: No interval needed at this time.  Continue symptomatic treatment with tramadol as above. Cough: Continue Tussionex as prescribed. Elevated TSH: Improving.  Today's result is pending.  Patient reports primary care is adjusting her Synthroid dosing.    I spent a total of 30 minutes reviewing chart data, face-to-face evaluation with the patient, counseling and coordination of care as detailed above.   Patient expressed understanding and was in agreement with this plan. She also understands that She can call clinic at any time with any questions, concerns, or complaints.    Cancer Staging  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 [facility]) Staging form: Lymphoid Neoplasms, AJCC 6th Edition - Clinical stage from 08/27/2016: Stage IIE - Signed by Jeralyn Ruths, MD on 08/27/2016  Rectal cancer [facility]) Staging form: Colon and Rectum, AJCC 8th Edition - Clinical: Stage IVA Dewaine Oats, [place]) - Signed by Jeralyn Ruths, MD on 11/02/2020   Jeralyn Ruths, MD   01/29/2024 9:39 AM

## 2024-01-29 NOTE — Patient Instructions (Signed)

## 2024-02-02 ENCOUNTER — Other Ambulatory Visit: Payer: Self-pay | Admitting: Cardiology

## 2024-02-03 NOTE — Telephone Encounter (Signed)
 Patient was told at her last office visit to follow up in 4-6 weeks after starting Levothyroxine 25 mcg for labs and to be followed by her PCP. I spoke with Zella Ball (daughter) who is on her DPR regarding this matter and she said, "I have not yet made an appointment with her PCP yet."   Do you need her to come for lab work? Also, can you refill her Levothyroxine until she gets a follow up appointment with her PCP?

## 2024-02-04 ENCOUNTER — Other Ambulatory Visit: Payer: Self-pay | Admitting: Oncology

## 2024-02-04 DIAGNOSIS — R918 Other nonspecific abnormal finding of lung field: Secondary | ICD-10-CM

## 2024-02-05 ENCOUNTER — Other Ambulatory Visit: Payer: Self-pay

## 2024-02-11 ENCOUNTER — Other Ambulatory Visit: Payer: Self-pay | Admitting: Oncology

## 2024-02-11 MED ORDER — HYDROCOD POLI-CHLORPHE POLI ER 10-8 MG/5ML PO SUER: 5.0000 mL | Freq: Two times a day (BID) | ORAL | 0 refills | Status: DC | PRN

## 2024-02-12 ENCOUNTER — Ambulatory Visit
Admission: RE | Admit: 2024-02-12 | Discharge: 2024-02-12 | Disposition: A | Discharge status: Home / Self Care | Source: Ambulatory Visit | Attending: Oncology | Admitting: Oncology

## 2024-02-12 DIAGNOSIS — C20 Malignant neoplasm of rectum: Secondary | ICD-10-CM

## 2024-02-12 DIAGNOSIS — R918 Other nonspecific abnormal finding of lung field: Secondary | ICD-10-CM

## 2024-02-12 MED ORDER — IOPAMIDOL (ISOVUE-300) INJECTION 61%
80.0000 mL | Freq: Once | INTRAVENOUS | Status: AC | PRN
  Administered 2024-02-12: 80 mL via INTRAVENOUS

## 2024-02-12 MED ORDER — SODIUM CHLORIDE 0.9% FLUSH: 10.0000 mL | INTRAVENOUS | Status: DC | PRN

## 2024-02-12 MED ORDER — HEPARIN SOD (PORK) LOCK FLUSH 100 UNIT/ML IV SOLN
500.0000 [IU] | Freq: Once | INTRAVENOUS | Status: DC
Start: 2024-02-12 — End: 2024-02-13

## 2024-02-14 ENCOUNTER — Other Ambulatory Visit: Payer: Self-pay

## 2024-02-17 ENCOUNTER — Telehealth: Payer: Self-pay | Admitting: *Deleted

## 2024-02-17 NOTE — Telephone Encounter (Signed)
 Spoke with [name] to give Dr. Layton Prima response. Offer to move patients appointment up to tomorrow if she wanted her to be seen sooner. Daughter said they will keep the appointment for Wednesday to discuss details further.

## 2024-02-17 NOTE — Telephone Encounter (Signed)
 The daughter takes her here for all appts. She looked at the scan results  and they look really bad, daughter Veronica Boyd wants to know about is there another  tx or nothing . She wants to the the answers so that she can get all of out of her system and be strong for her mother. She would like [place] or team to call her .

## 2024-02-17 NOTE — Telephone Encounter (Signed)
ERROR duplicate

## 2024-02-19 ENCOUNTER — Inpatient Hospital Stay (HOSPITAL_BASED_OUTPATIENT_CLINIC_OR_DEPARTMENT_OTHER): Attending: Oncology | Admitting: Oncology

## 2024-02-19 ENCOUNTER — Inpatient Hospital Stay: Attending: Oncology

## 2024-02-19 ENCOUNTER — Other Ambulatory Visit: Payer: Self-pay | Admitting: *Deleted

## 2024-02-19 ENCOUNTER — Inpatient Hospital Stay

## 2024-02-19 ENCOUNTER — Telehealth: Payer: Self-pay | Admitting: *Deleted

## 2024-02-19 ENCOUNTER — Encounter: Payer: Self-pay | Admitting: Oncology

## 2024-02-19 VITALS — BP 124/76 | HR 107 | Temp 97.6°F | Resp 16 | Ht <= 58 in | Wt 84.0 lb

## 2024-02-19 DIAGNOSIS — Z95828 Presence of other vascular implants and grafts: Secondary | ICD-10-CM

## 2024-02-19 DIAGNOSIS — C7801 Secondary malignant neoplasm of right lung: Secondary | ICD-10-CM | POA: Diagnosis present

## 2024-02-19 DIAGNOSIS — C20 Malignant neoplasm of rectum: Secondary | ICD-10-CM | POA: Diagnosis present

## 2024-02-19 DIAGNOSIS — Z5111 Encounter for antineoplastic chemotherapy: Secondary | ICD-10-CM | POA: Diagnosis present

## 2024-02-19 DIAGNOSIS — Z5189 Encounter for other specified aftercare: Secondary | ICD-10-CM | POA: Insufficient documentation

## 2024-02-19 DIAGNOSIS — R918 Other nonspecific abnormal finding of lung field: Secondary | ICD-10-CM

## 2024-02-19 LAB — CMP (CANCER CENTER ONLY)
ALT: 17 U/L (ref 0–44)
AST: 20 U/L (ref 15–41)
Albumin: 3.5 g/dL (ref 3.5–5.0)
Alkaline Phosphatase: 62 U/L (ref 38–126)
Anion gap: 11 (ref 5–15)
BUN: 12 mg/dL (ref 8–23)
CO2: 23 mmol/L (ref 22–32)
Calcium: 9 mg/dL (ref 8.9–10.3)
Chloride: 99 mmol/L (ref 98–111)
Creatinine: 0.8 mg/dL (ref 0.44–1.00)
GFR, Estimated: >60 mL/min (ref >60)
Glucose, Bld: 99 mg/dL (ref 70–99)
Potassium: 4.1 mmol/L (ref 3.5–5.1)
Sodium: 133 mmol/L — ABNORMAL LOW (ref 135–145)
Total Bilirubin: 0.8 mg/dL (ref 0.0–1.2)
Total Protein: 7.1 g/dL (ref 6.5–8.1)

## 2024-02-19 LAB — CBC WITH DIFFERENTIAL (CANCER CENTER ONLY)
Abs Immature Granulocytes: 0.04 10*3/uL (ref 0.00–0.07)
Basophils Absolute: 0 10*3/uL (ref 0.0–0.1)
Basophils Relative: 0 %
Eosinophils Absolute: 0.1 10*3/uL (ref 0.0–0.5)
Eosinophils Relative: 1 %
HCT: 40.7 % (ref 36.0–46.0)
Hemoglobin: 13.1 g/dL (ref 12.0–15.0)
Immature Granulocytes: 0 %
Lymphocytes Relative: 22 %
Lymphs Abs: 2.3 10*3/uL (ref 0.7–4.0)
MCH: 30.5 pg (ref 26.0–34.0)
MCHC: 32.2 g/dL (ref 30.0–36.0)
MCV: 94.7 fL (ref 80.0–100.0)
Monocytes Absolute: 1.4 10*3/uL — ABNORMAL HIGH (ref 0.1–1.0)
Monocytes Relative: 13 %
Neutro Abs: 6.4 10*3/uL (ref 1.7–7.7)
Neutrophils Relative %: 64 %
Platelet Count: 310 10*3/uL (ref 150–400)
RBC: 4.3 MIL/uL (ref 3.87–5.11)
RDW: 14.4 % (ref 11.5–15.5)
WBC Count: 10.3 10*3/uL (ref 4.0–10.5)
nRBC: 0 % (ref 0.0–0.2)

## 2024-02-19 MED ORDER — HEPARIN SOD (PORK) LOCK FLUSH 100 UNIT/ML IV SOLN
500.0000 [IU] | Freq: Once | INTRAVENOUS | Status: AC
  Administered 2024-02-19: 500 [IU] via INTRAVENOUS
  Filled 2024-02-19: qty 5

## 2024-02-19 MED ORDER — HYDROCODONE-ACETAMINOPHEN 5-325 MG PO TABS: 1.0000 | ORAL_TABLET | Freq: Four times a day (QID) | ORAL | 0 refills | Status: DC | PRN

## 2024-02-19 NOTE — Telephone Encounter (Signed)
 Daughter called because she checked the pharmacy and there was no medication for pain sent in. I called the pharmacy and they have the medication and it will be ready in about 20 min. Daughter will go get it for her Mom

## 2024-02-19 NOTE — Progress Notes (Signed)
 [facility]  [phone] [phone]  ID: Veronica Boyd OB: Oct 30, 1946  MR#: [id number]  QIO#:[id number]  Patient Care Team: Barbette Reichmann, MD as PCP - General (Internal Medicine) Antonieta Iba, MD as PCP - Cardiology (Cardiology) Nobie Putnam, MD as PCP - Electrophysiology (Cardiology) Jeralyn Ruths, MD as Consulting Physician (Oncology) Benita Gutter, RN as Oncology Nurse Navigator Salena Saner, MD as Consulting Physician (Pulmonary Disease)  CHIEF COMPLAINT: Recurrent stage IV rectal cancer, history of follicular lymphoma.  INTERVAL HISTORY: Patient returns to clinic today for further evaluation, discussion of her PET scan results, and treatment planning.  She continues to have chronic weakness and fatigue.  She also complains of increased intermittent chest pain.  She continues to have a chronic cough.  She has a fair appetite and denies weight loss.  Her peripheral neuropathy is unchanged.  She has no other neurologic complaints. She denies any recent fevers or illnesses.  She has no shortness of breath or hemoptysis.  She denies any nausea, vomiting, constipation or diarrhea.  She has no urinary complaints.  Patient offers no further specific complaints today.  REVIEW OF SYSTEMS:   Review of Systems  Constitutional:  Positive for malaise/fatigue. Negative for fever and weight loss.  Respiratory:  Positive for cough. Negative for hemoptysis and shortness of breath.   Cardiovascular:  Positive for chest pain. Negative for palpitations and leg swelling.  Gastrointestinal:  Negative for abdominal pain, blood in stool, diarrhea, melena, nausea and vomiting.  Genitourinary: Negative.  Negative for dysuria.  Musculoskeletal: Negative.  Negative for back pain, joint pain and neck pain.  Skin: Negative.  Negative for rash.  Neurological:  Positive for tingling and sensory change. Negative for focal weakness, weakness and  headaches.  Psychiatric/Behavioral: Negative.  The patient is not nervous/anxious and does not have insomnia.     As per HPI. Otherwise, a complete review of systems is negative.   PAST MEDICAL HISTORY: Past Medical History:  Diagnosis Date   Anemia due to antineoplastic chemotherapy    Aortic atherosclerosis (HCC)    Arthritis    Atrial fibrillation and flutter (HCC)    a.) 04/2023 Zio: Monitor 1 - 16 % aflutter burden w/ avg rate of 168, (78-226), possible Atach. Freq PACs, rare PVCs. Monitor 2 - 15% afib/flutter burden @ 115 [phone] Poss Atach. 4 beats NSVT. Freq PACs, rare PVCs; b.)  CHA2DS2-VASc = 4 (age x2, sex, vascular disease history); c.) cardiac rate/rhythm maintained on oral metoprolol succinate; chronically anticoagulated using apixaban   Bronchial obstruction    Chemotherapy-induced neuropathy (HCC)    Chicken pox    Colon polyp    Coronary artery calcification seen on CT scan    a. 11/2022 CT Chest: Coronary artery calcification and aortic atherosclerosis.   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 04/2023 Echo: EF >55%, nl RV size/fxn.  No significant valvular disease observed.   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, cN2a, cM1a)   Thrombocytopenia (HCC)     PAST SURGICAL HISTORY: Past Surgical History:  Procedure Laterality Date   AXILLARY LYMPH NODE DISSECTION Right 08/21/2016   Procedure: AXILLARY LYMPH NODE excision;  Surgeon: Benancio Bracket, MD;  Location: [facility] ORS;  Service: General;  Laterality: Right;   CATARACT EXTRACTION, BILATERAL Bilateral    COLONOSCOPY N/A 09/19/2020   Procedure: COLONOSCOPY;   Surgeon: Shane Darling, MD;  Location: [facility] ENDOSCOPY;  Service: Endoscopy;  Laterality: N/A;   ENDOBRONCHIAL ULTRASOUND Left 08/05/2023   Procedure: ENDOBRONCHIAL ULTRASOUND;  Surgeon: Marc Senior, MD;  Location: [facility] ORS;  Service: Pulmonary;  Laterality: Left;   PORTA CATH INSERTION N/A 09/16/2017   Procedure: PORTA CATH INSERTION;  Surgeon: Celso College, MD;  Location: [facility] INVASIVE CV LAB;  Service: Cardiovascular;  Laterality: N/A;   TONSILLECTOMY     TOTAL HIP ARTHROPLASTY Left 1992    FAMILY HISTORY: Family History  Problem Relation Age of Onset   Heart failure Mother    Emphysema Father    Diabetes Sister    Lung cancer Brother    Diabetes Brother    Breast cancer Paternal Aunt    Basal cell carcinoma Daughter     ADVANCED DIRECTIVES (Y/N):  N  HEALTH MAINTENANCE: Social History   Tobacco Use   Smoking status: Never   Smokeless tobacco: Never  Vaping Use   Vaping status: Never Used  Substance Use Topics   Alcohol use: No   Drug use: No     Colonoscopy:  PAP:  Bone density:  Lipid panel:  No Known Allergies  Current Outpatient Medications  Medication Sig Dispense Refill   amiodarone (PACERONE) 200 MG tablet Take 1 tablet (200 mg) by mouth twice daily for 2 weeks, then decrease to 1 tablet (200 mg) by mouth daily. 90 tablet 3   apixaban (ELIQUIS) 2.5 MG TABS tablet Take 1 tablet (2.5 mg total) by mouth 2 (two) times daily. 180 tablet 3   chlorpheniramine-HYDROcodone (TUSSIONEX) 10-8 MG/5ML Take 5 mLs by mouth every 12 (twelve) hours as needed for cough. 115 mL 0   diclofenac sodium (VOLTAREN) 1 % GEL Apply 2 g topically 2 (two) times daily as needed.     diphenoxylate-atropine (LOMOTIL) 2.5-0.025 MG tablet TAKE 2 TABLETS BY MOUTH 4 (FOUR) TIMES DAILY AS NEEDED FOR DIARRHEA OR LOOSE STOOLS. 60 tablet 1   Fluticasone Furoate (ARNUITY ELLIPTA) 100 MCG/ACT AEPB Inhale 1 puff into the lungs daily. 30 each 6   gabapentin (NEURONTIN) 300 MG capsule TAKE  1 CAPSULE BY MOUTH TWICE A DAY 60 capsule 2   KLOR-CON M20 20 MEQ tablet TAKE 1 TABLET BY MOUTH TWICE A DAY 180 tablet 1   levothyroxine (SYNTHROID) 25 MCG tablet TAKE 1 TABLET BY MOUTH DAILY BEFORE BREAKFAST. 30 tablet 0   lidocaine-prilocaine (EMLA) cream Apply 1 Application topically as needed. Apply to port 1 hour prior to use as needed 30 g 2   megestrol (MEGACE) 40 MG tablet TAKE 1 TABLET BY MOUTH EVERY DAY 90 tablet 1   metoprolol succinate (TOPROL-XL) 25 MG 24 hr tablet Take 25 mg by mouth daily.     pantoprazole (PROTONIX) 20 MG tablet Take 1 tablet by mouth daily as needed for heartburn.     simvastatin (ZOCOR) 20 MG tablet Take 20 mg by mouth at bedtime.     traMADol-acetaminophen (ULTRACET) 37.5-325 MG tablet Take 1 tablet by mouth every 8 (eight) hours as needed.     No current facility-administered medications for this visit.   Facility-Administered Medications Ordered in Other Visits  Medication Dose Route Frequency  Provider Last Rate Last Admin   heparin lock flush 100 unit/mL  500 Units Intravenous Once Shylee Durrett J, MD       heparin lock flush 100 unit/mL  500 Units Intracatheter PRN Adrian Alba, Zenovia Justman J, MD       heparin lock flush 100 unit/mL  500 Units Intravenous Once Arizona Sorn J, MD       heparin lock flush 100 unit/mL  500 Units Intravenous Once Alexie Lanni J, MD       sodium chloride flush (NS) 0.9 % injection 10 mL  10 mL Intravenous PRN Kainen Struckman J, MD   10 mL at 12/04/17 0901   sodium chloride flush (NS) 0.9 % injection 10 mL  10 mL Intracatheter PRN Deshayla Empson J, MD       sodium chloride flush (NS) 0.9 % injection 10 mL  10 mL Intravenous PRN Christalynn Boise J, MD   10 mL at 04/24/21 0839   yttrium-90 injection 22.3 millicurie  22.3 millicurie Intravenous Once Chrystal, Allison Ivory, MD        OBJECTIVE: Vitals:   02/19/24 0913  BP: 124/76  Pulse: (!) 107  Resp: 16  Temp: 97.6 F (36.4 C)  SpO2: 97%        Body mass index  is 17.56 kg/m.    ECOG FS:2 - Symptomatic, <50% confined to bed  General: Thin, no acute distress. Eyes: Pink conjunctiva, anicteric sclera. HEENT: Normocephalic, moist mucous membranes. Lungs: No audible wheezing or coughing. Heart: Regular rate and rhythm. Abdomen: Soft, nontender, no obvious distention. Musculoskeletal: No edema, cyanosis, or clubbing. Neuro: Alert, answering all questions appropriately. Cranial nerves grossly intact. Skin: No rashes or petechiae noted. Psych: Normal affect.  LAB RESULTS:  Lab Results  Component Value Date   NA 133 (L) 02/19/2024   K 4.1 02/19/2024   CL 99 02/19/2024   CO2 23 02/19/2024   GLUCOSE 99 02/19/2024   BUN 12 02/19/2024   CREATININE 0.80 02/19/2024   CALCIUM 9.0 02/19/2024   PROT 7.1 02/19/2024   ALBUMIN 3.5 02/19/2024   AST 20 02/19/2024   ALT 17 02/19/2024   ALKPHOS 62 02/19/2024   BILITOT 0.8 02/19/2024   GFRNONAA >60 02/19/2024   GFRAA >60 06/28/2020    Lab Results  Component Value Date   WBC 10.3 02/19/2024   NEUTROABS 6.4 02/19/2024   HGB 13.1 02/19/2024   HCT 40.7 02/19/2024   MCV 94.7 02/19/2024   PLT 310 02/19/2024     STUDIES: CT CHEST ABDOMEN PELVIS W CONTRAST Result Date: 02/16/2024 CLINICAL DATA:  Follow-up rectal cancer, lung lesions, status post right lower lobe lung nodule SBRT, additional history of follicular lymphoma * Tracking Code: BO * EXAM: CT CHEST, ABDOMEN, AND PELVIS WITH CONTRAST TECHNIQUE: Multidetector CT imaging of the chest, abdomen and pelvis was performed following the standard protocol during bolus administration of intravenous contrast. RADIATION DOSE REDUCTION: This exam was performed according to the departmental dose-optimization program which includes automated exposure control, adjustment of the mA and/or kV according to patient size and/or use of iterative reconstruction technique. CONTRAST:  80mL ISOVUE-300 IOPAMIDOL (ISOVUE-300) INJECTION 61% additional oral enteric contrast  COMPARISON:  11/11/2023 FINDINGS: CT CHEST FINDINGS Cardiovascular: Aortic atherosclerosis. Mild cardiomegaly. Left coronary artery calcifications no pericardial effusion. Mediastinum/Nodes: No enlarged mediastinal, hilar, or axillary lymph nodes. Thyroid gland, trachea, and esophagus demonstrate no significant findings. Lungs/Pleura: Large mass of the infrahilar right lower lobe obstructing the airways with nearly complete atelectasis or consolidation of the right lower lobe increased  in size, measuring 5.0 x 4.8 cm, previously 3.7 x 3.0 cm when measured similarly (series 2, image 33). Numerous bilateral pulmonary masses and nodules are significantly increased in size, index nodule in the anterior right upper lobe measuring 2.4 x 1.3 cm, previously 1.6 x 0.7 cm (series 3, image 36), additional index mass in the dependent left lower lobe measuring 3.7 x 2.9 cm, previously 2.5 x 1.7 cm (series 3, image 79). New, diffusely scattered ground-glass airspace opacity throughout the lungs. Musculoskeletal: Pectus deformity.  No acute osseous findings. CT ABDOMEN PELVIS FINDINGS Hepatobiliary: No solid liver abnormality is seen. Contracted gallbladder. No gallstones, gallbladder wall thickening, or biliary dilatation. Pancreas: Unremarkable. No pancreatic ductal dilatation or surrounding inflammatory changes. Spleen: Normal in size without significant abnormality. Adrenals/Urinary Tract: Adrenal glands are unremarkable. Kidneys are normal, without renal calculi, solid lesion, or hydronephrosis. Bladder is unremarkable. Stomach/Bowel: Stomach is within normal limits. Appendix not clearly visualized. Large burden of stool throughout the colon. Evaluation of the low pelvis significantly limited by dense metallic streak artifact from adjacent hip arthroplasty. Within this limitation, similar appearance of circumferential mass of the low rectum (series 2, image 77) Vascular/Lymphatic: No significant vascular findings are present.  No enlarged abdominal or pelvic lymph nodes. Reproductive: No mass or other abnormality. Other: No abdominal wall hernia or abnormality. No ascites. Musculoskeletal: No acute osseous findings. Status post left hip total arthroplasty. IMPRESSION: 1. Increased size of a large mass of the infrahilar right lower lobe. Numerous bilateral pulmonary masses and nodules are likewise significantly increased in size. 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 2. Evaluation of the low pelvis significantly limited by dense metallic streak artifact from adjacent hip arthroplasty. Within this limitation, similar appearance of circumferential mass of the low rectum. 3. New, diffusely scattered nonspecific infectious or inflammatory ground-glass airspace opacity throughout the lungs. Drug toxicity a significant differential consideration in the setting of chemotherapy or immunotherapy. 4. Coronary artery disease. Aortic Atherosclerosis (ICD10-I70.0). Electronically Signed   By: Fredricka Jenny M.D.   On: 02/16/2024 16:46     ONCOLOGY HISTORY:  Biopsy confirmed rectal cancer.  [facility] completed [facility] on October 11, 2020 revealed extension of the tumor beyond the wall into the rectovaginal recess with suspected invasion of the vaginal wall posteriorly.  Tumor also appears to involve the internal anal sphincter.  There are also numerous prominent presacral and mesorectal lymph nodes highly suspicious for malignancy.  PET scan results from November 17, 2020 reviewed independently with 3 hypermetabolic right lower lobe lung nodules consistent with pulmonary metastasis.  Patient completed cycle 8 of FOLFOX on Mar 29, 2021.  She then completed cycle 5 of her 5-FU pump on May 26, 2021 and XRT on June 01, 2021.  PET scan results from July 17, 2021 reviewed independently with no evidence of disease other than enlarging right lower lobe lung lesion.  Patient completed SBRT to the right lung lesion on  August 22, 2021.  Repeat PET scan on July 25, 2021 revealed minimal residual disease in the rectum and likely resolution of lesions in the lung.  PET scan results from Mar 21, 2023 reviewed independently with clear progression of disease in patient's pelvis as well as multiple pulmonary nodules.  Patient subsequently was placed on FOLFOX plus Avastin again between April 10, 2023 and October 16, 2023.  CT scan results from November 11, 2023 with significant improvement of disease burden except for her lung masses which have increased in size.  Circular gene testing was  positive for PD-L1 expression as well as ATM and CHEK2 mutations. Given the PD-L1 positivity, patient has agreed to pursue treatment with single agent Keytruda which she received from November 27, 2023 through January 29, 2024.  Patient was once again noted to have progression of disease.  ASSESSMENT: Recurrent stage IV rectal cancer, history of follicular lymphoma.  PLAN:    Recurrent stage IV rectal cancer: See oncology history as above.  Keytruda has been discontinued.  Patient has never received irinotecan based therapy, so we will proceed with FOLFIRI one week from today.  Will dose reduce treatment 25% from the onset.  Patient will also receive 1 L of IV fluids as well as Udenyca with her pump removal 2 days later.  Will reimage after 4 cycles. Left lower lobe lung lesion: It is possible her lung lesions are second primary originating in lung, but previous biopsy from bronchoscopy did not reveal any evidence of malignancy.  Will treat as metastatic rectal cancer, although irinotecan may have some effectiveness in lung malignancy. Recurrent follicular lymphoma: CT scan results from December 23, 2019 reviewed independently with no obvious evidence of recurrent or progressive disease.  PET scan results as above consistent with rectal cancer metastasis and no evidence of lymphoma.   Patient received Zevalin on July 30, 2017 with not much  therapeutic effect.  She subsequently underwent cycles 5 of R-CHOP chemotherapy with Neulasta support completing on December 12, 2017.  Patient continues to be in complete remission.  Neuropathy: Chronic and unchanged. Pain: Continue tramadol as needed.  Patient was also given a prescription for Norco today. Poor appetite/weight loss: Resolved.  Unclear if patient is still taking Megace.   Coping/adjustment: Patient was previously given a referral to [facility]. Atrial fibrillation: Appreciate cardiology input.  Patient now on amiodarone. Pelvic fracture: No interval needed at this time.  Continue symptomatic treatment with tramadol as above. Cough: Continue Tussionex as prescribed. Elevated TSH: Improving.     Patient expressed understanding and was in agreement with this plan. She also understands that She can call clinic at any time with any questions, concerns, or complaints.    Cancer Staging  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 [facility]) Staging form: Lymphoid Neoplasms, AJCC 6th Edition - Clinical stage from 08/27/2016: Stage IIE - Signed by Shellie Dials, MD on 08/27/2016  Rectal cancer [facility]) Staging form: Colon and Rectum, AJCC 8th Edition - Clinical: Stage IVA [name], [place].[facility]) - Signed by Shellie Dials, MD on 11/02/2020   Shellie Dials, MD   02/19/2024 12:03 PM

## 2024-02-19 NOTE — Progress Notes (Signed)
 Last night had a really bad pain in her left side. Having increased SOB this morning.

## 2024-02-19 NOTE — Progress Notes (Signed)
 DISCONTINUE ON PATHWAY REGIMEN - Colorectal     A cycle is every 14 days:     Bevacizumab-xxxx      Oxaliplatin      Leucovorin      Fluorouracil      Fluorouracil   **Always confirm dose/schedule in your pharmacy ordering system**  PRIOR TREATMENT: [id number]: [id number] + Bevacizumab q14 Days  START ON PATHWAY REGIMEN - Colorectal     A cycle is every 14 days:     Irinotecan      Leucovorin      Fluorouracil      Fluorouracil   **Always confirm dose/schedule in your pharmacy ordering system**  Patient Characteristics: Distant Metastases, Nonsurgical Candidate, Non-KRAS G12C, RAS Mutation Positive/Unknown (BRAF V600 Wild-Type/Unknown), Standard Cytotoxic Therapy, Second Line Standard Cytotoxic Therapy, Bevacizumab Ineligible Tumor Location: Rectal Therapeutic Status: Distant Metastases Microsatellite/Mismatch Repair Status: Unknown BRAF Mutation Status: Did Not Order Test KRAS/NRAS Mutation Status: Did Not Order Test Preferred Therapy Approach: Standard Cytotoxic Therapy Standard Cytotoxic Line of Therapy: Second Line Standard Cytotoxic Therapy Bevacizumab Eligibility: Ineligible Intent of Therapy: Non-Curative / Palliative Intent, Discussed with Patient

## 2024-02-19 NOTE — Progress Notes (Unsigned)
 nor

## 2024-02-20 NOTE — Progress Notes (Signed)
 Pharmacist Chemotherapy Monitoring - Initial Assessment    Anticipated start date: 02/26/24   The following has been reviewed per standard work regarding the patient's treatment regimen: The patient's diagnosis, treatment plan and drug doses, and organ/hematologic function Lab orders and baseline tests specific to treatment regimen  The treatment plan start date, drug sequencing, and pre-medications Prior authorization status  Patient's documented medication list, including drug-drug interaction screen and prescriptions for anti-emetics and supportive care specific to the treatment regimen The drug concentrations, fluid compatibility, administration routes, and timing of the medications to be used The patient's access for treatment and lifetime cumulative dose history, if applicable  The patient's medication allergies and previous infusion related reactions, if applicable   Changes made to treatment plan:  N/A  Follow up needed:  N/A   Veronica Boyd, PharmD, BCPS Clinical Pharmacist   02/20/2024  11:00 AM

## 2024-02-21 ENCOUNTER — Other Ambulatory Visit: Payer: Self-pay | Admitting: Oncology

## 2024-02-21 ENCOUNTER — Telehealth: Payer: Self-pay | Admitting: *Deleted

## 2024-02-21 ENCOUNTER — Inpatient Hospital Stay: Admitting: Nurse Practitioner

## 2024-02-21 ENCOUNTER — Ambulatory Visit
Admission: RE | Admit: 2024-02-21 | Discharge: 2024-02-21 | Disposition: A | Discharge status: Home / Self Care | Source: Ambulatory Visit | Attending: Nurse Practitioner | Admitting: Nurse Practitioner

## 2024-02-21 ENCOUNTER — Encounter: Payer: Self-pay | Admitting: Nurse Practitioner

## 2024-02-21 VITALS — BP 134/74 | HR 94 | Temp 97.3°F | Resp 16 | Wt 84.0 lb

## 2024-02-21 DIAGNOSIS — M79602 Pain in left arm: Secondary | ICD-10-CM | POA: Insufficient documentation

## 2024-02-21 DIAGNOSIS — Z5111 Encounter for antineoplastic chemotherapy: Secondary | ICD-10-CM | POA: Diagnosis not present

## 2024-02-21 NOTE — Telephone Encounter (Signed)
 Called and spoke with patient regarding phone call from daughter Grayce with concerns of patient complaining of pain in left arm.  Reports of swelling and knot as well in left arm. Pt reports this all occurred yesterday but feels better today. Denies pain and swelling.  RN instructed patient that Dr Jacobo would like her to have be check by a [facility] provider. Patient agreed to a 230p appointment this afternoon.  Daughter Grayce notified of call and appointment as well.  Both verbalized understanding.

## 2024-02-21 NOTE — Telephone Encounter (Signed)
 The daughter called and sad that her left arm and she took pill and it go better then dinner she says it was worse. The daughter just thought what is causing this pain. She says that is pain if she touches the arm. Daughter says that it started at left arm and then going up the whole arm.

## 2024-02-21 NOTE — Progress Notes (Signed)
 Pt in to see [facility] provider per MD request.  Pt reports a large hematoma came up above her inner wrist area yesterday and she had swelling up her arm and a very tender area just below the outer antecubital area. Pt reports improved today.

## 2024-02-21 NOTE — Progress Notes (Signed)
 Symptom Management Clinic  [facility] at [facility] A Department of the [facility] [address] [place], [place] [address] [phone] (phone) [phone] (fax)  Patient Care Team: Antonio Baumgarten, MD as PCP - General (Internal Medicine) Devorah Fonder, MD as PCP - Cardiology (Cardiology) Ardeen Kohler, MD as PCP - Electrophysiology (Cardiology) Shellie Dials, MD as Consulting Physician (Oncology) Rochell Chroman, RN as Oncology Nurse Navigator Marc Senior, MD as Consulting Physician (Pulmonary Disease)   Name of the patient: Veronica Boyd  [id number]  January 20, 1946   Date of visit: 02/21/24  Diagnosis- Rectal Cancer & Follicular Lymphoma  Chief complaint/ Reason for visit- Arm pain & swelling  Heme/Onc history:  Oncology History Overview Note  Initial diagnosis 2014. S/p radiation 3 years ago.  Noted increase lymphadenopathy and biopsy was recommended.  Unfortunately she refused and was lost to follow-up. Return to clinic in September 2017 with increased swelling surrounding her right collarbone.  Had biopsy that was suggestive of follicular lymphoma but not definitive.  Had PET scan to complete the staging work-up.  PET scan showed hypermetabolic adenopathy in bilateral subpectoral and axillary regions, right internal mammary chain and right paratracheal and subclavicular regions.  There is no evidence of hypermetabolic lymphadenopathy within the abdomen or pelvis.  Received Zevalin  on July 30, 2017 with not much therapeutic effect.  She subsequently underwent cycles 5 of R CHOP chemotherapy with Neulasta  support completing on December 12, 2017.  No further intervention or treatments are needed at this time.  PET scan results from February 19, 2018 reviewed independently and reported as above with no suspicious finding for active lymphoma.      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)  07/24/2016 Initial Diagnosis   Follicular lymphoma (HCC)   Rectal cancer (HCC)  11/02/2020 Initial Diagnosis   Rectal cancer (HCC)   11/02/2020 Cancer Staging   Staging form: Colon and Rectum, AJCC 8th Edition - Clinical: Stage IVA [name], cM1a) - Signed by Shellie Dials, MD on 11/02/2020   11/16/2020 - 03/31/2021 Chemotherapy         04/24/2021 - 05/26/2021 Chemotherapy   Patient is on Treatment Plan : RECTAL 5FU IVCI D1-5 + XRT     04/10/2023 - 10/18/2023 Chemotherapy   Patient is on Treatment Plan : COLORECTAL FOLFOX + Bevacizumab  q14d     02/26/2024 -  Chemotherapy   Patient is on Treatment Plan : COLORECTAL FOLFIRI q14d       Interval history- Patient is 78 year old female with above history of rectal cancer and follicular lymphoma who presents to [facility] for complaints of left forearm pain that radiates to her elbow. She noticed earlier this week a bruise had formed. No recent venipuncture in this area or trauma. She reports compliance with eliquis  2.5 mg tablet BID (dosing based on age and weight) for her history of a fib. Also takes megace .   She completed folfox + avastin  chemotherapy 10/16/23. January imaging showed progressive disease of lungs. Positive for PD-L1, ATM, and CHEK2 mutations, therefore she initiated keytruda  in January 2025. 02/12/24- CT C/A/P for restaging was found to have worsening pulmonary metastatic disease. Plan to initiate FOLFIRI next week.   Review of systems- Review of Systems  Constitutional:  Negative for chills, fever and malaise/fatigue.  Respiratory:  Negative for cough and shortness of breath.   Cardiovascular:  Negative for chest pain, palpitations and leg  swelling.     No Known Allergies  Past Medical History:  Diagnosis Date   Anemia due to antineoplastic chemotherapy    Aortic atherosclerosis (HCC)    Arthritis    Atrial fibrillation and flutter (HCC)    a.) 04/2023 Zio: Monitor 1 -  16 % aflutter burden w/ avg rate of 168, (78-226), possible Atach. Freq PACs, rare PVCs. Monitor 2 - 15% afib/flutter burden @ 115 [phone] Poss Atach. 4 beats NSVT. Freq PACs, rare PVCs; b.)  CHA2DS2-VASc = 4 (age x2, sex, vascular disease history); c.) cardiac rate/rhythm maintained on oral metoprolol  succinate; chronically anticoagulated using apixaban    Bronchial obstruction    Chemotherapy-induced neuropathy (HCC)    Chicken pox    Colon polyp    Coronary artery calcification seen on CT scan    a. 11/2022 CT Chest: Coronary artery calcification and aortic atherosclerosis.   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 04/2023 Echo: EF >55%, nl RV size/fxn.  No significant valvular disease observed.   Hyperlipidemia    Mass of right chest wall    Multiple lung nodules 09/2022   On apixaban  therapy    Osteoporosis    Pectus excavatum    Protein-calorie malnutrition, severe (HCC)    Rectal carcinoma (HCC)    a.) recurrent stage IVA (cT4a, cN2a, cM1a)   Thrombocytopenia (HCC)     Past Surgical History:  Procedure Laterality Date   AXILLARY LYMPH NODE DISSECTION Right 08/21/2016   Procedure: AXILLARY LYMPH NODE excision;  Surgeon: Benancio Bracket, MD;  Location: [facility] ORS;  Service: General;  Laterality: Right;   CATARACT EXTRACTION, BILATERAL Bilateral    COLONOSCOPY N/A 09/19/2020   Procedure: COLONOSCOPY;  Surgeon: Shane Darling, MD;  Location: [facility] ENDOSCOPY;  Service: Endoscopy;  Laterality: N/A;   ENDOBRONCHIAL ULTRASOUND Left 08/05/2023   Procedure: ENDOBRONCHIAL ULTRASOUND;  Surgeon: Marc Senior, MD;  Location: [facility] ORS;  Service: Pulmonary;  Laterality: Left;   PORTA CATH INSERTION N/A 09/16/2017   Procedure: PORTA CATH INSERTION;  Surgeon: Celso College, MD;  Location: [facility] INVASIVE CV LAB;  Service:  Cardiovascular;  Laterality: N/A;   TONSILLECTOMY     TOTAL HIP ARTHROPLASTY Left 1992    Social History   Socioeconomic History   Marital status: Divorced    Spouse name: Not on file   Number of children: 2   Years of education: Not on file   Highest education level: Not on file  Occupational History   Not on file  Tobacco Use   Smoking status: Never   Smokeless tobacco: Never  Vaping Use   Vaping status: Never Used  Substance and Sexual Activity   Alcohol use: No   Drug use: No   Sexual activity: Not Currently  Other Topics Concern   Not on file  Social History Narrative   Lives with daughter   Social Drivers of Health   Financial Resource Strain: Low Risk  (07/30/2023)   Received from [facility]   Overall Financial Resource Strain (CARDIA)    Difficulty of Paying Living Expenses: Not hard at all  Food Insecurity: No Food Insecurity (07/30/2023)   Received from [facility]   Hunger Vital Sign    Worried About Running Out of Food in the Last Year: Never true  Ran Out of Food in the Last Year: Never true  Transportation Needs: No Transportation Needs (07/30/2023)   Received from [facility] - Transportation    In the past 12 months, has lack of transportation kept you from medical appointments or from getting medications?: No    Lack of Transportation (Non-Medical): No  Physical Activity: Not on file  Stress: Not on file  Social Connections: Not on file  Intimate Partner Violence: Not on file    Family History  Problem Relation Age of Onset   Heart failure Mother    Emphysema Father    Diabetes Sister    Lung cancer Brother    Diabetes Brother    Breast cancer Paternal Aunt    Basal cell carcinoma Daughter     Current Outpatient Medications:    amiodarone  (PACERONE ) 200 MG tablet, Take 1 tablet (200 mg) by mouth twice daily for 2 weeks, then decrease to 1 tablet (200 mg) by mouth daily.,  Disp: 90 tablet, Rfl: 3   apixaban  (ELIQUIS ) 2.5 MG TABS tablet, Take 1 tablet (2.5 mg total) by mouth 2 (two) times daily., Disp: 180 tablet, Rfl: 3   chlorpheniramine-HYDROcodone  (TUSSIONEX) 10-8 MG/5ML, Take 5 mLs by mouth every 12 (twelve) hours as needed for cough., Disp: 115 mL, Rfl: 0   diclofenac sodium (VOLTAREN) 1 % GEL, Apply 2 g topically 2 (two) times daily as needed., Disp: , Rfl:    diphenoxylate -atropine  (LOMOTIL ) 2.5-0.025 MG tablet, TAKE 2 TABLETS BY MOUTH 4 (FOUR) TIMES DAILY AS NEEDED FOR DIARRHEA OR LOOSE STOOLS., Disp: 60 tablet, Rfl: 1   Fluticasone Furoate  (ARNUITY ELLIPTA ) 100 MCG/ACT AEPB, Inhale 1 puff into the lungs daily., Disp: 30 each, Rfl: 6   gabapentin  (NEURONTIN ) 300 MG capsule, TAKE 1 CAPSULE BY MOUTH TWICE A DAY, Disp: 60 capsule, Rfl: 2   HYDROcodone -acetaminophen  (NORCO/VICODIN) 5-325 MG tablet, Take 1 tablet by mouth every 6 (six) hours as needed for moderate pain (pain score 4-6)., Disp: 30 tablet, Rfl: 0   levothyroxine  (SYNTHROID ) 25 MCG tablet, TAKE 1 TABLET BY MOUTH DAILY BEFORE BREAKFAST., Disp: 30 tablet, Rfl: 0   lidocaine -prilocaine  (EMLA ) cream, Apply 1 Application topically as needed. Apply to port 1 hour prior to use as needed, Disp: 30 g, Rfl: 2   megestrol  (MEGACE ) 40 MG tablet, TAKE 1 TABLET BY MOUTH EVERY DAY, Disp: 90 tablet, Rfl: 1   metoprolol  succinate (TOPROL -XL) 25 MG 24 hr tablet, Take 25 mg by mouth daily., Disp: , Rfl:    pantoprazole  (PROTONIX ) 20 MG tablet, Take 1 tablet by mouth daily as needed for heartburn., Disp: , Rfl:    potassium chloride  SA (KLOR-CON  M) 20 MEQ tablet, TAKE 1 TABLET BY MOUTH TWICE A DAY, Disp: 180 tablet, Rfl: 1   simvastatin  (ZOCOR ) 20 MG tablet, Take 20 mg by mouth at bedtime., Disp: , Rfl:    traMADol-acetaminophen  (ULTRACET) 37.5-325 MG tablet, Take 1 tablet by mouth every 8 (eight) hours as needed., Disp: , Rfl:  No current facility-administered medications for this visit.  Facility-Administered  Medications Ordered in Other Visits:    heparin  lock flush 100 unit/mL, 500 Units, Intravenous, Once, Finnegan, Deadra Everts, MD   heparin  lock flush 100 unit/mL, 500 Units, Intracatheter, PRN, Adrian Alba, Timothy J, MD   heparin  lock flush 100 unit/mL, 500 Units, Intravenous, Once, Adrian Alba, Deadra Everts, MD   heparin  lock flush 100 unit/mL, 500 Units, Intravenous, Once, Adrian Alba, Deadra Everts, MD   sodium chloride  flush (NS) 0.9 %  injection 10 mL, 10 mL, Intravenous, PRN, Finnegan, Timothy J, MD, 10 mL at 12/04/17 0901   sodium chloride  flush (NS) 0.9 % injection 10 mL, 10 mL, Intracatheter, PRN, Finnegan, Timothy J, MD   sodium chloride  flush (NS) 0.9 % injection 10 mL, 10 mL, Intravenous, PRN, Shellie Dials, MD, 10 mL at 04/24/21 0839   yttrium-90 injection 22.3 millicurie, 22.3 millicurie, Intravenous, Once, Glenis Langdon, MD  Physical exam:  Vitals:   02/21/24 1440  BP: 134/74  Pulse: 94  Resp: 16  Temp: (!) 97.3 F (36.3 C)  TempSrc: Tympanic  SpO2: 98%  Weight: 84 lb (38.1 kg)   Physical Exam Vitals reviewed.  Constitutional:      Comments: Accompanied by daughter. In wheelchair. This build.  Pulmonary:     Effort: No respiratory distress.     Breath sounds: Normal breath sounds.  Musculoskeletal:       Arms:     Comments: # focal hematoma of left forearm. Tender to palpation up to elbow.   Skin:    Coloration: Skin is pale.  Neurological:     Mental Status: She is alert and oriented to person, place, and time.         Latest Ref Rng & Units 02/19/2024    9:05 AM  CMP  Glucose 70 - 99 mg/dL 99   BUN 8 - 23 mg/dL 12   Creatinine 1.61 - 1.00 mg/dL 0.96   Sodium 045 - 409 mmol/L 133   Potassium 3.5 - 5.1 mmol/L 4.1   Chloride 98 - 111 mmol/L 99   CO2 22 - 32 mmol/L 23   Calcium  8.9 - 10.3 mg/dL 9.0   Total Protein 6.5 - 8.1 g/dL 7.1   Total Bilirubin 0.0 - 1.2 mg/dL 0.8   Alkaline Phos 38 - 126 U/L 62   AST 15 - 41 U/L 20   ALT 0 - 44 U/L 17       Latest  Ref Rng & Units 02/19/2024    9:05 AM  CBC  WBC 4.0 - 10.5 K/uL 10.3   Hemoglobin 12.0 - 15.0 g/dL 81.1   Hematocrit 91.4 - 46.0 % 40.7   Platelets 150 - 400 K/uL 310    CT CHEST ABDOMEN PELVIS W CONTRAST Result Date: 02/16/2024 CLINICAL DATA:  Follow-up rectal cancer, lung lesions, status post right lower lobe lung nodule SBRT, additional history of follicular lymphoma * Tracking Code: BO * EXAM: CT CHEST, ABDOMEN, AND PELVIS WITH CONTRAST TECHNIQUE: Multidetector CT imaging of the chest, abdomen and pelvis was performed following the standard protocol during bolus administration of intravenous contrast. RADIATION DOSE REDUCTION: This exam was performed according to the departmental dose-optimization program which includes automated exposure control, adjustment of the mA and/or kV according to patient size and/or use of iterative reconstruction technique. CONTRAST:  80mL ISOVUE -300 IOPAMIDOL  (ISOVUE -300) INJECTION 61% additional oral enteric contrast COMPARISON:  11/11/2023 FINDINGS: CT CHEST FINDINGS Cardiovascular: Aortic atherosclerosis. Mild cardiomegaly. Left coronary artery calcifications no pericardial effusion. Mediastinum/Nodes: No enlarged mediastinal, hilar, or axillary lymph nodes. Thyroid  gland, trachea, and esophagus demonstrate no significant findings. Lungs/Pleura: Large mass of the infrahilar right lower lobe obstructing the airways with nearly complete atelectasis or consolidation of the right lower lobe increased in size, measuring 5.0 x 4.8 cm, previously 3.7 x 3.0 cm when measured similarly (series 2, image 33). Numerous bilateral pulmonary masses and nodules are significantly increased in size, index nodule in the anterior right upper lobe measuring 2.4 x 1.3 cm, previously  1.6 x 0.7 cm (series 3, image 36), additional index mass in the dependent left lower lobe measuring 3.7 x 2.9 cm, previously 2.5 x 1.7 cm (series 3, image 79). New, diffusely scattered ground-glass airspace opacity  throughout the lungs. Musculoskeletal: Pectus deformity.  No acute osseous findings. CT ABDOMEN PELVIS FINDINGS Hepatobiliary: No solid liver abnormality is seen. Contracted gallbladder. No gallstones, gallbladder wall thickening, or biliary dilatation. Pancreas: Unremarkable. No pancreatic ductal dilatation or surrounding inflammatory changes. Spleen: Normal in size without significant abnormality. Adrenals/Urinary Tract: Adrenal glands are unremarkable. Kidneys are normal, without renal calculi, solid lesion, or hydronephrosis. Bladder is unremarkable. Stomach/Bowel: Stomach is within normal limits. Appendix not clearly visualized. Large burden of stool throughout the colon. Evaluation of the low pelvis significantly limited by dense metallic streak artifact from adjacent hip arthroplasty. Within this limitation, similar appearance of circumferential mass of the low rectum (series 2, image 77) Vascular/Lymphatic: No significant vascular findings are present. No enlarged abdominal or pelvic lymph nodes. Reproductive: No mass or other abnormality. Other: No abdominal wall hernia or abnormality. No ascites. Musculoskeletal: No acute osseous findings. Status post left hip total arthroplasty. IMPRESSION: 1. Increased size of a large mass of the infrahilar right lower lobe. Numerous bilateral pulmonary masses and nodules are likewise significantly increased in size. 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 2. Evaluation of the low pelvis significantly limited by dense metallic streak artifact from adjacent hip arthroplasty. Within this limitation, similar appearance of circumferential mass of the low rectum. 3. New, diffusely scattered nonspecific infectious or inflammatory ground-glass airspace opacity throughout the lungs. Drug toxicity a significant differential consideration in the setting of chemotherapy or immunotherapy. 4. Coronary artery disease. Aortic Atherosclerosis (ICD10-I70.0).  Electronically Signed   By: Fredricka Jenny M.D.   On: 02/16/2024 16:46    Assessment and plan- Patient is a 78 y.o. female   Left arm pain- presentation concerning for SVT despite compliance with eliquis . High risk of thrombosis d/t progressive malignancy. Recommend ultrasound to evaluate. Patient in agreement. F/u based on results.    Visit Diagnosis 1. Left arm pain    Patient expressed understanding and was in agreement with this plan. She also understands that She can call clinic at any time with any questions, concerns, or complaints.   Thank you for allowing me to participate in the care of this very pleasant patient.   Kenney Peacemaker, DNP, AGNP-C, AOCNP [facility] [address] [phone]

## 2024-02-25 ENCOUNTER — Other Ambulatory Visit: Payer: Self-pay | Admitting: Oncology

## 2024-02-25 ENCOUNTER — Encounter: Payer: Self-pay | Admitting: Oncology

## 2024-02-25 DIAGNOSIS — C20 Malignant neoplasm of rectum: Secondary | ICD-10-CM

## 2024-02-26 ENCOUNTER — Inpatient Hospital Stay

## 2024-02-26 ENCOUNTER — Encounter: Payer: Self-pay | Admitting: Oncology

## 2024-02-26 ENCOUNTER — Other Ambulatory Visit: Payer: Self-pay

## 2024-02-26 ENCOUNTER — Inpatient Hospital Stay (HOSPITAL_BASED_OUTPATIENT_CLINIC_OR_DEPARTMENT_OTHER): Admitting: Oncology

## 2024-02-26 VITALS — BP 124/79 | HR 110 | Temp 97.8°F | Resp 16 | Ht <= 58 in | Wt 82.2 lb

## 2024-02-26 VITALS — BP 115/56 | HR 85

## 2024-02-26 DIAGNOSIS — C20 Malignant neoplasm of rectum: Secondary | ICD-10-CM

## 2024-02-26 DIAGNOSIS — Z5111 Encounter for antineoplastic chemotherapy: Secondary | ICD-10-CM | POA: Diagnosis not present

## 2024-02-26 LAB — CBC WITH DIFFERENTIAL (CANCER CENTER ONLY)
Abs Immature Granulocytes: 0.06 10*3/uL (ref 0.00–0.07)
Basophils Absolute: 0 10*3/uL (ref 0.0–0.1)
Basophils Relative: 0 %
Eosinophils Absolute: 0.1 10*3/uL (ref 0.0–0.5)
Eosinophils Relative: 1 %
HCT: 40.4 % (ref 36.0–46.0)
Hemoglobin: 13 g/dL (ref 12.0–15.0)
Immature Granulocytes: 1 %
Lymphocytes Relative: 16 %
Lymphs Abs: 1.6 10*3/uL (ref 0.7–4.0)
MCH: 30 pg (ref 26.0–34.0)
MCHC: 32.2 g/dL (ref 30.0–36.0)
MCV: 93.3 fL (ref 80.0–100.0)
Monocytes Absolute: 1.2 10*3/uL — ABNORMAL HIGH (ref 0.1–1.0)
Monocytes Relative: 12 %
Neutro Abs: 6.9 10*3/uL (ref 1.7–7.7)
Neutrophils Relative %: 70 %
Platelet Count: 322 10*3/uL (ref 150–400)
RBC: 4.33 MIL/uL (ref 3.87–5.11)
RDW: 14.5 % (ref 11.5–15.5)
WBC Count: 9.8 10*3/uL (ref 4.0–10.5)
nRBC: 0 % (ref 0.0–0.2)

## 2024-02-26 LAB — CMP (CANCER CENTER ONLY)
ALT: 13 U/L (ref 0–44)
AST: 18 U/L (ref 15–41)
Albumin: 3.5 g/dL (ref 3.5–5.0)
Alkaline Phosphatase: 62 U/L (ref 38–126)
Anion gap: 10 (ref 5–15)
BUN: 11 mg/dL (ref 8–23)
CO2: 25 mmol/L (ref 22–32)
Calcium: 8.9 mg/dL (ref 8.9–10.3)
Chloride: 98 mmol/L (ref 98–111)
Creatinine: 0.76 mg/dL (ref 0.44–1.00)
GFR, Estimated: >60 mL/min (ref >60)
Glucose, Bld: 133 mg/dL — ABNORMAL HIGH (ref 70–99)
Potassium: 3.7 mmol/L (ref 3.5–5.1)
Sodium: 133 mmol/L — ABNORMAL LOW (ref 135–145)
Total Bilirubin: 0.7 mg/dL (ref 0.0–1.2)
Total Protein: 7 g/dL (ref 6.5–8.1)

## 2024-02-26 LAB — MAGNESIUM: Magnesium: 1.9 mg/dL (ref 1.7–2.4)

## 2024-02-26 MED ORDER — ATROPINE SULFATE 1 MG/ML IV SOLN: 0.5000 mg | Freq: Once | INTRAVENOUS | Status: DC | PRN

## 2024-02-26 MED ORDER — SODIUM CHLORIDE 0.9 % IV SOLN
INTRAVENOUS | Status: DC
  Filled 2024-02-26: qty 250

## 2024-02-26 MED ORDER — SODIUM CHLORIDE 0.9 % IV SOLN
135.0000 mg/m2 | Freq: Once | INTRAVENOUS | Status: AC
  Administered 2024-02-26: 160 mg via INTRAVENOUS
  Filled 2024-02-26: qty 8

## 2024-02-26 MED ORDER — SODIUM CHLORIDE 0.9 % IV SOLN
1800.0000 mg/m2 | INTRAVENOUS | Status: DC
  Administered 2024-02-26: 2500 mg via INTRAVENOUS
  Filled 2024-02-26: qty 50

## 2024-02-26 MED ORDER — FLUOROURACIL CHEMO INJECTION 500 MG/10ML
300.0000 mg/m2 | Freq: Once | INTRAVENOUS | Status: AC
  Administered 2024-02-26: 400 mg via INTRAVENOUS
  Filled 2024-02-26: qty 8

## 2024-02-26 MED ORDER — DEXAMETHASONE SODIUM PHOSPHATE 10 MG/ML IJ SOLN
10.0000 mg | Freq: Once | INTRAMUSCULAR | Status: AC
Start: 2024-02-26 — End: 2024-02-26
  Administered 2024-02-26: 10 mg via INTRAVENOUS
  Filled 2024-02-26: qty 1

## 2024-02-26 MED ORDER — SODIUM CHLORIDE 0.9 % IV SOLN
300.0000 mg/m2 | Freq: Once | INTRAVENOUS | Status: AC
  Administered 2024-02-26: 376 mg via INTRAVENOUS
  Filled 2024-02-26: qty 18.8

## 2024-02-26 MED ORDER — PALONOSETRON HCL INJECTION 0.25 MG/5ML
0.2500 mg | Freq: Once | INTRAVENOUS | Status: AC
Start: 2024-02-26 — End: 2024-02-26
  Administered 2024-02-26: 0.25 mg via INTRAVENOUS
  Filled 2024-02-26: qty 5

## 2024-02-26 NOTE — Progress Notes (Signed)
 [facility]  Telephone:([phone]) [phone] Fax:(336) [phone]  ID: Veronica Boyd OB: 1946-05-02  MR#: [id number]  ION#:[id number]  Patient Care Team: Antonio Baumgarten, MD as PCP - General (Internal Medicine) Devorah Fonder, MD as PCP - Cardiology (Cardiology) Ardeen Kohler, MD as PCP - Electrophysiology (Cardiology) Shellie Dials, MD as Consulting Physician (Oncology) Rochell Chroman, RN as Oncology Nurse Navigator Marc Senior, MD as Consulting Physician (Pulmonary Disease)  CHIEF COMPLAINT: Recurrent stage IV rectal cancer, history of follicular lymphoma.  INTERVAL HISTORY: Patient returns to clinic today for further evaluation and reinitiation of chemotherapy with FOLFIRI.  She continues to have chronic weakness and fatigue, but otherwise feels well.  She does not complain of pain today.   She continues to have a chronic cough.  She has a fair appetite and denies weight loss.  Her peripheral neuropathy is unchanged.  She has no other neurologic complaints. She denies any recent fevers or illnesses.  She denies any chest pain, shortness of breath, or hemoptysis.  She denies any nausea, vomiting, constipation or diarrhea.  She has no urinary complaints.  Patient offers no further specific complaints today.  REVIEW OF SYSTEMS:   Review of Systems  Constitutional:  Positive for malaise/fatigue. Negative for fever and weight loss.  Respiratory:  Positive for cough. Negative for hemoptysis and shortness of breath.   Cardiovascular: Negative.  Negative for chest pain and leg swelling.  Gastrointestinal:  Negative for abdominal pain, blood in stool, diarrhea, melena, nausea and vomiting.  Genitourinary: Negative.  Negative for dysuria.  Musculoskeletal: Negative.  Negative for back pain, joint pain and neck pain.  Skin: Negative.  Negative for rash.  Neurological:  Positive for tingling and sensory change. Negative for focal weakness, weakness and headaches.   Psychiatric/Behavioral: Negative.  The patient is not nervous/anxious and does not have insomnia.     As per HPI. Otherwise, a complete review of systems is negative.   PAST MEDICAL HISTORY: Past Medical History:  Diagnosis Date   Anemia due to antineoplastic chemotherapy    Aortic atherosclerosis (HCC)    Arthritis    Atrial fibrillation and flutter (HCC)    a.) 04/2023 Zio: Monitor 1 - 16 % aflutter burden w/ avg rate of 168, (78-226), possible Atach. Freq PACs, rare PVCs. Monitor 2 - 15% afib/flutter burden @ 115 [phone] Poss Atach. 4 beats NSVT. Freq PACs, rare PVCs; b.)  CHA2DS2-VASc = 4 (age x2, sex, vascular disease history); c.) cardiac rate/rhythm maintained on oral metoprolol  succinate; chronically anticoagulated using apixaban    Bronchial obstruction    Chemotherapy-induced neuropathy (HCC)    Chicken pox    Colon polyp    Coronary artery calcification seen on CT scan    a. 11/2022 CT Chest: Coronary artery calcification and aortic atherosclerosis.   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 04/2023 Echo: EF >55%, nl RV size/fxn.  No significant valvular disease observed.   Hyperlipidemia    Mass of right chest wall    Multiple lung nodules 09/2022   On apixaban  therapy    Osteoporosis    Pectus excavatum    Protein-calorie malnutrition, severe (HCC)    Rectal carcinoma (HCC)    a.) recurrent stage IVA (cT4a, cN2a, cM1a)   Thrombocytopenia (HCC)     PAST SURGICAL HISTORY: Past Surgical History:  Procedure Laterality Date   AXILLARY LYMPH NODE DISSECTION Right 08/21/2016   Procedure: AXILLARY LYMPH NODE excision;  Surgeon: Benancio Bracket, MD;  Location: [facility] ORS;  Service: General;  Laterality: Right;   CATARACT EXTRACTION, BILATERAL Bilateral    COLONOSCOPY N/A 09/19/2020   Procedure: COLONOSCOPY;  Surgeon:  Shane Darling, MD;  Location: [facility] ENDOSCOPY;  Service: Endoscopy;  Laterality: N/A;   ENDOBRONCHIAL ULTRASOUND Left 08/05/2023   Procedure: ENDOBRONCHIAL ULTRASOUND;  Surgeon: Marc Senior, MD;  Location: [facility] ORS;  Service: Pulmonary;  Laterality: Left;   PORTA CATH INSERTION N/A 09/16/2017   Procedure: PORTA CATH INSERTION;  Surgeon: Celso College, MD;  Location: [facility] INVASIVE CV LAB;  Service: Cardiovascular;  Laterality: N/A;   TONSILLECTOMY     TOTAL HIP ARTHROPLASTY Left 1992    FAMILY HISTORY: Family History  Problem Relation Age of Onset   Heart failure Mother    Emphysema Father    Diabetes Sister    Lung cancer Brother    Diabetes Brother    Breast cancer Paternal Aunt    Basal cell carcinoma Daughter     ADVANCED DIRECTIVES (Y/N):  N  HEALTH MAINTENANCE: Social History   Tobacco Use   Smoking status: Never   Smokeless tobacco: Never  Vaping Use   Vaping status: Never Used  Substance Use Topics   Alcohol use: No   Drug use: No     Colonoscopy:  PAP:  Bone density:  Lipid panel:  No Known Allergies  Current Outpatient Medications  Medication Sig Dispense Refill   amiodarone  (PACERONE ) 200 MG tablet Take 1 tablet (200 mg) by mouth twice daily for 2 weeks, then decrease to 1 tablet (200 mg) by mouth daily. 90 tablet 3   apixaban  (ELIQUIS ) 2.5 MG TABS tablet Take 1 tablet (2.5 mg total) by mouth 2 (two) times daily. 180 tablet 3   chlorpheniramine-HYDROcodone  (TUSSIONEX) 10-8 MG/5ML Take 5 mLs by mouth every 12 (twelve) hours as needed for cough. 115 mL 0   diclofenac sodium (VOLTAREN) 1 % GEL Apply 2 g topically 2 (two) times daily as needed.     diphenoxylate -atropine  (LOMOTIL ) 2.5-0.025 MG tablet TAKE 2 TABLETS BY MOUTH 4 (FOUR) TIMES DAILY AS NEEDED FOR DIARRHEA OR LOOSE STOOLS. 60 tablet 1   Fluticasone Furoate  (ARNUITY ELLIPTA ) 100 MCG/ACT AEPB Inhale 1 puff into the lungs daily. 30 each 6   gabapentin  (NEURONTIN ) 300 MG capsule TAKE 1 CAPSULE  BY MOUTH TWICE A DAY 60 capsule 2   HYDROcodone -acetaminophen  (NORCO/VICODIN) 5-325 MG tablet Take 1 tablet by mouth every 6 (six) hours as needed for moderate pain (pain score 4-6). 30 tablet 0   levothyroxine  (SYNTHROID ) 25 MCG tablet TAKE 1 TABLET BY MOUTH DAILY BEFORE BREAKFAST. 30 tablet 0   lidocaine -prilocaine  (EMLA ) cream Apply 1 Application topically as needed. Apply to port 1 hour prior to use as needed 30 g 2   megestrol  (MEGACE ) 40 MG tablet TAKE 1 TABLET BY MOUTH EVERY DAY 90 tablet 1   metoprolol  succinate (TOPROL -XL) 25 MG 24 hr tablet Take 25 mg by mouth daily.     pantoprazole  (PROTONIX ) 20 MG tablet Take 1 tablet by mouth daily as needed for heartburn.     potassium chloride  SA (KLOR-CON  M) 20 MEQ tablet TAKE 1 TABLET BY MOUTH TWICE A DAY 180 tablet 1   simvastatin  (ZOCOR ) 20 MG tablet Take 20 mg by mouth at bedtime.     traMADol-acetaminophen  (ULTRACET) 37.5-325 MG tablet Take 1 tablet by mouth  every 8 (eight) hours as needed.     No current facility-administered medications for this visit.   Facility-Administered Medications Ordered in Other Visits  Medication Dose Route Frequency Provider Last Rate Last Admin   heparin  lock flush 100 unit/mL  500 Units Intravenous Once Datha Kissinger J, MD       heparin  lock flush 100 unit/mL  500 Units Intracatheter PRN Adrian Alba, Deadra Everts, MD       heparin  lock flush 100 unit/mL  500 Units Intravenous Once Saliou Barnier J, MD       heparin  lock flush 100 unit/mL  500 Units Intravenous Once Orvil Faraone J, MD       sodium chloride  flush (NS) 0.9 % injection 10 mL  10 mL Intravenous PRN [place] Cohick J, MD   10 mL at 12/04/17 0901   sodium chloride  flush (NS) 0.9 % injection 10 mL  10 mL Intracatheter PRN Shellie Dials, MD       sodium chloride  flush (NS) 0.9 % injection 10 mL  10 mL Intravenous PRN Jacob Chamblee J, MD   10 mL at 04/24/21 0839   yttrium-90 injection 22.3 millicurie  22.3 millicurie Intravenous Once  Chrystal, Allison Ivory, MD        OBJECTIVE: Vitals:   02/26/24 1049  BP: 124/79  Pulse: (!) 110  Resp: 16  Temp: 97.8 F (36.6 C)  SpO2: 94%       Body mass index is 17.18 kg/m.    ECOG FS:2 - Symptomatic, <50% confined to bed  General: Thin, no acute distress. Eyes: Pink conjunctiva, anicteric sclera. HEENT: Normocephalic, moist mucous membranes. Lungs: No audible wheezing or coughing. Heart: Regular rate and rhythm. Abdomen: Soft, nontender, no obvious distention. Musculoskeletal: No edema, cyanosis, or clubbing. Neuro: Alert, answering all questions appropriately. Cranial nerves grossly intact. Skin: No rashes or petechiae noted. Psych: Normal affect.   LAB RESULTS:  Lab Results  Component Value Date   NA 133 (L) 02/19/2024   K 4.1 02/19/2024   CL 99 02/19/2024   CO2 23 02/19/2024   GLUCOSE 99 02/19/2024   BUN 12 02/19/2024   CREATININE 0.80 02/19/2024   CALCIUM  9.0 02/19/2024   PROT 7.1 02/19/2024   ALBUMIN 3.5 02/19/2024   AST 20 02/19/2024   ALT 17 02/19/2024   ALKPHOS 62 02/19/2024   BILITOT 0.8 02/19/2024   GFRNONAA >60 02/19/2024   GFRAA >60 06/28/2020    Lab Results  Component Value Date   WBC 9.8 02/26/2024   NEUTROABS 6.9 02/26/2024   HGB 13.0 02/26/2024   HCT 40.4 02/26/2024   MCV 93.3 02/26/2024   PLT 322 02/26/2024     STUDIES: US  Venous Img Upper Uni Left (DVT) Result Date: 02/21/2024 CLINICAL DATA:  Left upper extremity pain and edema. EXAM: LEFT UPPER EXTREMITY VENOUS DOPPLER ULTRASOUND TECHNIQUE: Gray-scale sonography with graded compression, as well as color Doppler and duplex ultrasound were performed to evaluate the upper extremity deep venous system from the level of the subclavian vein and including the jugular, axillary, basilic, radial, ulnar and upper cephalic vein. Spectral Doppler was utilized to evaluate flow at rest and with distal augmentation maneuvers. COMPARISON:  None Available. FINDINGS: Contralateral Subclavian Vein:  Respiratory phasicity is normal and symmetric with the symptomatic side. No evidence of thrombus. Normal compressibility. Internal Jugular Vein: No evidence of thrombus. Normal compressibility, respiratory phasicity and response to augmentation. Subclavian Vein: No evidence of thrombus. Normal compressibility, respiratory phasicity and response to augmentation. Axillary Vein: No evidence of thrombus.  Normal compressibility, respiratory phasicity and response to augmentation. Cephalic Vein: No evidence of thrombus. Normal compressibility, respiratory phasicity and response to augmentation. Basilic Vein: No evidence of thrombus. Normal compressibility, respiratory phasicity and response to augmentation. Brachial Veins: No evidence of thrombus. Normal compressibility, respiratory phasicity and response to augmentation. Radial Veins: No evidence of thrombus. Normal compressibility, respiratory phasicity and response to augmentation. Ulnar Veins: No evidence of thrombus. Normal compressibility, respiratory phasicity and response to augmentation. Venous Reflux:  None visualized. Other Findings: No evidence of superficial thrombophlebitis or abnormal fluid collection. IMPRESSION: No evidence of DVT within the left upper extremity. Electronically Signed   By: Erica Hau M.D.   On: 02/21/2024 17:00   CT CHEST ABDOMEN PELVIS W CONTRAST Result Date: 02/16/2024 CLINICAL DATA:  Follow-up rectal cancer, lung lesions, status post right lower lobe lung nodule SBRT, additional history of follicular lymphoma * Tracking Code: BO * EXAM: CT CHEST, ABDOMEN, AND PELVIS WITH CONTRAST TECHNIQUE: Multidetector CT imaging of the chest, abdomen and pelvis was performed following the standard protocol during bolus administration of intravenous contrast. RADIATION DOSE REDUCTION: This exam was performed according to the departmental dose-optimization program which includes automated exposure control, adjustment of the mA and/or kV  according to patient size and/or use of iterative reconstruction technique. CONTRAST:  80mL ISOVUE -300 IOPAMIDOL  (ISOVUE -300) INJECTION 61% additional oral enteric contrast COMPARISON:  11/11/2023 FINDINGS: CT CHEST FINDINGS Cardiovascular: Aortic atherosclerosis. Mild cardiomegaly. Left coronary artery calcifications no pericardial effusion. Mediastinum/Nodes: No enlarged mediastinal, hilar, or axillary lymph nodes. Thyroid  gland, trachea, and esophagus demonstrate no significant findings. Lungs/Pleura: Large mass of the infrahilar right lower lobe obstructing the airways with nearly complete atelectasis or consolidation of the right lower lobe increased in size, measuring 5.0 x 4.8 cm, previously 3.7 x 3.0 cm when measured similarly (series 2, image 33). Numerous bilateral pulmonary masses and nodules are significantly increased in size, index nodule in the anterior right upper lobe measuring 2.4 x 1.3 cm, previously 1.6 x 0.7 cm (series 3, image 36), additional index mass in the dependent left lower lobe measuring 3.7 x 2.9 cm, previously 2.5 x 1.7 cm (series 3, image 79). New, diffusely scattered ground-glass airspace opacity throughout the lungs. Musculoskeletal: Pectus deformity.  No acute osseous findings. CT ABDOMEN PELVIS FINDINGS Hepatobiliary: No solid liver abnormality is seen. Contracted gallbladder. No gallstones, gallbladder wall thickening, or biliary dilatation. Pancreas: Unremarkable. No pancreatic ductal dilatation or surrounding inflammatory changes. Spleen: Normal in size without significant abnormality. Adrenals/Urinary Tract: Adrenal glands are unremarkable. Kidneys are normal, without renal calculi, solid lesion, or hydronephrosis. Bladder is unremarkable. Stomach/Bowel: Stomach is within normal limits. Appendix not clearly visualized. Large burden of stool throughout the colon. Evaluation of the low pelvis significantly limited by dense metallic streak artifact from adjacent hip  arthroplasty. Within this limitation, similar appearance of circumferential mass of the low rectum (series 2, image 77) Vascular/Lymphatic: No significant vascular findings are present. No enlarged abdominal or pelvic lymph nodes. Reproductive: No mass or other abnormality. Other: No abdominal wall hernia or abnormality. No ascites. Musculoskeletal: No acute osseous findings. Status post left hip total arthroplasty. IMPRESSION: 1. Increased size of a large mass of the infrahilar right lower lobe. Numerous bilateral pulmonary masses and nodules are likewise significantly increased in size. 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 2. Evaluation of the low pelvis significantly limited by dense metallic streak artifact from adjacent hip arthroplasty. Within this limitation, similar appearance of circumferential mass of the low rectum. 3. New, diffusely scattered nonspecific  infectious or inflammatory ground-glass airspace opacity throughout the lungs. Drug toxicity a significant differential consideration in the setting of chemotherapy or immunotherapy. 4. Coronary artery disease. Aortic Atherosclerosis (ICD10-I70.0). Electronically Signed   By: Fredricka Jenny M.D.   On: 02/16/2024 16:46     ONCOLOGY HISTORY:  Biopsy confirmed rectal cancer.  MRI completed at [facility] on October 11, 2020 revealed extension of the tumor beyond the wall into the rectovaginal recess with suspected invasion of the vaginal wall posteriorly.  Tumor also appears to involve the internal anal sphincter.  There are also numerous prominent presacral and mesorectal lymph nodes highly suspicious for malignancy.  PET scan results from November 17, 2020 reviewed independently with 3 hypermetabolic right lower lobe lung nodules consistent with pulmonary metastasis.  Patient completed cycle 8 of FOLFOX on Mar 29, 2021.  She then completed cycle 5 of her 5-FU pump on May 26, 2021 and XRT on June 01, 2021.  PET scan  results from July 17, 2021 reviewed independently with no evidence of disease other than enlarging right lower lobe lung lesion.  Patient completed SBRT to the right lung lesion on August 22, 2021.  Repeat PET scan on July 25, 2021 revealed minimal residual disease in the rectum and likely resolution of lesions in the lung.  PET scan results from Mar 21, 2023 reviewed independently with clear progression of disease in patient's pelvis as well as multiple pulmonary nodules.  Patient subsequently was placed on FOLFOX plus Avastin  again between April 10, 2023 and October 16, 2023.  CT scan results from November 11, 2023 with significant improvement of disease burden except for her lung masses which have increased in size.  Circular gene testing was positive for PD-L1 expression as well as ATM and CHEK2 mutations. Given the PD-L1 positivity, patient has agreed to pursue treatment with single agent Keytruda  which she received from November 27, 2023 through January 29, 2024.  Patient was once again noted to have progression of disease.  ASSESSMENT: Recurrent stage IV rectal cancer, history of follicular lymphoma.  PLAN:    Recurrent stage IV rectal cancer: See oncology history as above.  Keytruda  has been discontinued.  Patient has never received irinotecan  based therapy, so we will proceed with FOLFIRI. Will dose reduce treatment 25% from the onset.  Patient will also receive 1 L of IV fluids as well as Udenyca  with her pump removal 2 days later.  Proceed with cycle 1 today.  Return to clinic in 2 days for pump removal, in 1 week for laboratory work and further evaluation.  Patient will be given treatment on a 21-day cycle.  Will reimage after 4 cycles. Left lower lobe lung lesion: It is possible her lung lesions are second primary originating in lung, but previous biopsy from bronchoscopy did not reveal any evidence of malignancy.  Will treat as metastatic rectal cancer, although irinotecan  may have some  effectiveness in lung malignancy. Recurrent follicular lymphoma: CT scan results from December 23, 2019 reviewed independently with no obvious evidence of recurrent or progressive disease.  PET scan results as above consistent with rectal cancer metastasis and no evidence of lymphoma.   Patient received Zevalin  on July 30, 2017 with not much therapeutic effect.  She subsequently underwent cycles 5 of R-CHOP chemotherapy with Neulasta  support completing on December 12, 2017.  Patient continues to be in complete remission.  Neuropathy: Chronic and unchanged. Pain: Patient does not complain of this today.  Continue tramadol and/or Norco as needed.   Poor  appetite/weight loss: Resolved.  Unclear if patient is still taking Megace .   Coping/adjustment: Patient was previously given a referral to [facility]. Atrial fibrillation: Appreciate cardiology input.  Patient now on amiodarone . Pelvic fracture: No interval needed at this time.  Continue symptomatic treatment with tramadol as above. Cough: Chronic and unchanged.  Continue Tussionex as prescribed. Elevated TSH: Improving.     Patient expressed understanding and was in agreement with this plan. She also understands that She can call clinic at any time with any questions, concerns, or complaints.    Cancer Staging  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 [facility]) Staging form: Lymphoid Neoplasms, AJCC 6th Edition - Clinical stage from 08/27/2016: Stage IIE - Signed by Shellie Dials, MD on 08/27/2016  Rectal cancer [facility]) Staging form: Colon and Rectum, AJCC 8th Edition - Clinical: Stage IVA [name], [place]) - Signed by Shellie Dials, MD on 11/02/2020   Shellie Dials, MD   02/26/2024 11:06 AM

## 2024-02-27 ENCOUNTER — Other Ambulatory Visit

## 2024-02-27 ENCOUNTER — Ambulatory Visit: Admitting: Oncology

## 2024-02-27 ENCOUNTER — Ambulatory Visit

## 2024-02-28 ENCOUNTER — Ambulatory Visit

## 2024-02-28 ENCOUNTER — Inpatient Hospital Stay

## 2024-02-28 VITALS — BP 144/47 | HR 82 | Temp 97.1°F | Resp 18

## 2024-02-28 DIAGNOSIS — C20 Malignant neoplasm of rectum: Secondary | ICD-10-CM

## 2024-02-28 DIAGNOSIS — Z5111 Encounter for antineoplastic chemotherapy: Secondary | ICD-10-CM | POA: Diagnosis not present

## 2024-02-28 MED ORDER — HEPARIN SOD (PORK) LOCK FLUSH 100 UNIT/ML IV SOLN
500.0000 [IU] | Freq: Once | INTRAVENOUS | Status: AC | PRN
  Administered 2024-02-28: 500 [IU]
  Filled 2024-02-28: qty 5

## 2024-02-28 MED ORDER — SODIUM CHLORIDE 0.9% FLUSH
10.0000 mL | INTRAVENOUS | Status: DC | PRN
  Filled 2024-02-28: qty 10

## 2024-02-28 MED ORDER — SODIUM CHLORIDE 0.9 % IV SOLN
Freq: Once | INTRAVENOUS | Status: AC
Start: 2024-02-28 — End: 2024-02-28
  Filled 2024-02-28: qty 250

## 2024-02-28 MED ORDER — PEGFILGRASTIM-CBQV 6 MG/0.6ML ~~LOC~~ SOSY
6.0000 mg | PREFILLED_SYRINGE | Freq: Once | SUBCUTANEOUS | Status: AC
  Administered 2024-02-28: 6 mg via SUBCUTANEOUS
  Filled 2024-02-28: qty 0.6

## 2024-02-29 ENCOUNTER — Encounter: Payer: Self-pay | Admitting: Oncology

## 2024-03-03 ENCOUNTER — Other Ambulatory Visit: Payer: Self-pay | Admitting: *Deleted

## 2024-03-03 DIAGNOSIS — C20 Malignant neoplasm of rectum: Secondary | ICD-10-CM

## 2024-03-04 ENCOUNTER — Inpatient Hospital Stay (HOSPITAL_BASED_OUTPATIENT_CLINIC_OR_DEPARTMENT_OTHER): Admitting: Oncology

## 2024-03-04 ENCOUNTER — Inpatient Hospital Stay

## 2024-03-04 ENCOUNTER — Encounter: Payer: Self-pay | Admitting: Oncology

## 2024-03-04 ENCOUNTER — Telehealth: Payer: Self-pay

## 2024-03-04 ENCOUNTER — Other Ambulatory Visit

## 2024-03-04 VITALS — BP 116/77 | HR 105 | Temp 97.9°F | Resp 16 | Ht <= 58 in | Wt 84.1 lb

## 2024-03-04 DIAGNOSIS — C20 Malignant neoplasm of rectum: Secondary | ICD-10-CM | POA: Diagnosis not present

## 2024-03-04 DIAGNOSIS — Z5111 Encounter for antineoplastic chemotherapy: Secondary | ICD-10-CM | POA: Diagnosis not present

## 2024-03-04 LAB — CMP (CANCER CENTER ONLY)
ALT: 10 U/L (ref 0–44)
AST: 17 U/L (ref 15–41)
Albumin: 3.3 g/dL — ABNORMAL LOW (ref 3.5–5.0)
Alkaline Phosphatase: 65 U/L (ref 38–126)
Anion gap: 10 (ref 5–15)
BUN: 11 mg/dL (ref 8–23)
CO2: 24 mmol/L (ref 22–32)
Calcium: 8.6 mg/dL — ABNORMAL LOW (ref 8.9–10.3)
Chloride: 102 mmol/L (ref 98–111)
Creatinine: 0.87 mg/dL (ref 0.44–1.00)
GFR, Estimated: >60 mL/min (ref >60)
Glucose, Bld: 130 mg/dL — ABNORMAL HIGH (ref 70–99)
Potassium: 3.4 mmol/L — ABNORMAL LOW (ref 3.5–5.1)
Sodium: 136 mmol/L (ref 135–145)
Total Bilirubin: 0.8 mg/dL (ref 0.0–1.2)
Total Protein: 6.3 g/dL — ABNORMAL LOW (ref 6.5–8.1)

## 2024-03-04 LAB — CBC WITH DIFFERENTIAL/PLATELET
Abs Immature Granulocytes: 0.09 10*3/uL — ABNORMAL HIGH (ref 0.00–0.07)
Basophils Absolute: 0.1 10*3/uL (ref 0.0–0.1)
Basophils Relative: 2 %
Eosinophils Absolute: 0.1 10*3/uL (ref 0.0–0.5)
Eosinophils Relative: 1 %
HCT: 36.1 % (ref 36.0–46.0)
Hemoglobin: 11.4 g/dL — ABNORMAL LOW (ref 12.0–15.0)
Immature Granulocytes: 2 %
Lymphocytes Relative: 29 %
Lymphs Abs: 1.5 10*3/uL (ref 0.7–4.0)
MCH: 29.5 pg (ref 26.0–34.0)
MCHC: 31.6 g/dL (ref 30.0–36.0)
MCV: 93.5 fL (ref 80.0–100.0)
Monocytes Absolute: 0.8 10*3/uL (ref 0.1–1.0)
Monocytes Relative: 15 %
Neutro Abs: 2.6 10*3/uL (ref 1.7–7.7)
Neutrophils Relative %: 51 %
Platelets: 117 10*3/uL — ABNORMAL LOW (ref 150–400)
RBC: 3.86 MIL/uL — ABNORMAL LOW (ref 3.87–5.11)
RDW: 14.5 % (ref 11.5–15.5)
Smear Review: NORMAL
WBC: 5.2 10*3/uL (ref 4.0–10.5)
nRBC: 0 % (ref 0.0–0.2)

## 2024-03-04 LAB — MAGNESIUM: Magnesium: 1.8 mg/dL (ref 1.7–2.4)

## 2024-03-04 MED ORDER — HEPARIN SOD (PORK) LOCK FLUSH 100 UNIT/ML IV SOLN
500.0000 [IU] | Freq: Once | INTRAVENOUS | Status: AC | PRN
  Administered 2024-03-04: 500 [IU]
  Filled 2024-03-04: qty 5

## 2024-03-04 MED ORDER — SODIUM CHLORIDE 0.9 % IV SOLN
Freq: Once | INTRAVENOUS | Status: AC
  Filled 2024-03-04: qty 250

## 2024-03-04 MED ORDER — SODIUM CHLORIDE 0.9% FLUSH
10.0000 mL | Freq: Once | INTRAVENOUS | Status: DC | PRN
  Filled 2024-03-04: qty 10

## 2024-03-04 NOTE — Progress Notes (Signed)
 [facility]  Telephone:([phone]) [phone] [phone]) [phone]  ID: Veronica Boyd OB: 12-Jun-1946  MR#: [id number]  BMW#:[id number]  Patient Care Team: Antonio Baumgarten, MD as PCP - General (Internal Medicine) Devorah Fonder, MD as PCP - Cardiology (Cardiology) Ardeen Kohler, MD as PCP - Electrophysiology (Cardiology) Shellie Dials, MD as Consulting Physician (Oncology) Rochell Chroman, RN as Oncology Nurse Navigator Marc Senior, MD as Consulting Physician (Pulmonary Disease)  CHIEF COMPLAINT: Recurrent stage IV rectal cancer, history of follicular lymphoma.  INTERVAL HISTORY: Patient returns to clinic today for repeat laboratory work, further evaluation, and assess her toleration of cycle 1 of FOLFIRI.  She has significant amount of diarrhea, but nursing withheld her atropine  dose.  She otherwise tolerated her treatment well.  She does not complain of pain today.   She continues to have a chronic cough.  She has a fair appetite, and has gained weight in the interim.  Her peripheral neuropathy is unchanged.  She has no other neurologic complaints. She denies any recent fevers or illnesses.  She denies any chest pain, shortness of breath, or hemoptysis.  She denies any nausea, vomiting, or constipation.  She has no urinary complaints.  Patient offers no further specific complaints today.  REVIEW OF SYSTEMS:   Review of Systems  Constitutional:  Positive for malaise/fatigue. Negative for fever and weight loss.  Respiratory:  Positive for cough. Negative for hemoptysis and shortness of breath.   Cardiovascular: Negative.  Negative for chest pain and leg swelling.  Gastrointestinal:  Positive for diarrhea. Negative for abdominal pain, blood in stool, melena, nausea and vomiting.  Genitourinary: Negative.  Negative for dysuria.  Musculoskeletal: Negative.  Negative for back pain, joint pain and neck pain.  Skin: Negative.  Negative for rash.  Neurological:   Positive for tingling and sensory change. Negative for focal weakness, weakness and headaches.  Psychiatric/Behavioral: Negative.  The patient is not nervous/anxious and does not have insomnia.     As per HPI. Otherwise, a complete review of systems is negative.   PAST MEDICAL HISTORY: Past Medical History:  Diagnosis Date   Anemia due to antineoplastic chemotherapy    Aortic atherosclerosis (HCC)    Arthritis    Atrial fibrillation and flutter (HCC)    a.) 04/2023 Zio: Monitor 1 - 16 % aflutter burden w/ avg rate of 168, (78-226), possible Atach. Freq PACs, rare PVCs. Monitor 2 - 15% afib/flutter burden @ 115 [phone] Poss Atach. 4 beats NSVT. Freq PACs, rare PVCs; b.)  CHA2DS2-VASc = 4 (age x2, sex, vascular disease history); c.) cardiac rate/rhythm maintained on oral metoprolol  succinate; chronically anticoagulated using apixaban    Bronchial obstruction    Chemotherapy-induced neuropathy (HCC)    Chicken pox    Colon polyp    Coronary artery calcification seen on CT scan    a. 11/2022 CT Chest: Coronary artery calcification and aortic atherosclerosis.   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 04/2023 Echo: EF >55%, nl RV size/fxn.  No significant valvular disease observed.   Hyperlipidemia    Mass of right chest wall    Multiple lung nodules 09/2022   On apixaban  therapy    Osteoporosis    Pectus excavatum    Protein-calorie malnutrition, severe (HCC)    Rectal carcinoma (HCC)    a.) recurrent stage IVA (  [name], Dayana.Dage)   Thrombocytopenia (HCC)     PAST SURGICAL HISTORY: Past Surgical History:  Procedure Laterality Date   AXILLARY LYMPH NODE DISSECTION Right 08/21/2016   Procedure: AXILLARY LYMPH NODE excision;  Surgeon: Benancio Bracket, MD;  Location: [facility] ORS;  Service: General;  Laterality: Right;   CATARACT  EXTRACTION, BILATERAL Bilateral    COLONOSCOPY N/A 09/19/2020   Procedure: COLONOSCOPY;  Surgeon: Shane Darling, MD;  Location: [facility] ENDOSCOPY;  Service: Endoscopy;  Laterality: N/A;   ENDOBRONCHIAL ULTRASOUND Left 08/05/2023   Procedure: ENDOBRONCHIAL ULTRASOUND;  Surgeon: Marc Senior, MD;  Location: [facility] ORS;  Service: Pulmonary;  Laterality: Left;   PORTA CATH INSERTION N/A 09/16/2017   Procedure: PORTA CATH INSERTION;  Surgeon: Celso College, MD;  Location: [facility] INVASIVE CV LAB;  Service: Cardiovascular;  Laterality: N/A;   TONSILLECTOMY     TOTAL HIP ARTHROPLASTY Left 1992    FAMILY HISTORY: Family History  Problem Relation Age of Onset   Heart failure Mother    Emphysema Father    Diabetes Sister    Lung cancer Brother    Diabetes Brother    Breast cancer Paternal Aunt    Basal cell carcinoma Daughter     ADVANCED DIRECTIVES (Y/N):  N  HEALTH MAINTENANCE: Social History   Tobacco Use   Smoking status: Never   Smokeless tobacco: Never  Vaping Use   Vaping status: Never Used  Substance Use Topics   Alcohol use: No   Drug use: No     Colonoscopy:  PAP:  Bone density:  Lipid panel:  No Known Allergies  Current Outpatient Medications  Medication Sig Dispense Refill   amiodarone  (PACERONE ) 200 MG tablet Take 1 tablet (200 mg) by mouth twice daily for 2 weeks, then decrease to 1 tablet (200 mg) by mouth daily. 90 tablet 3   apixaban  (ELIQUIS ) 2.5 MG TABS tablet Take 1 tablet (2.5 mg total) by mouth 2 (two) times daily. 180 tablet 3   chlorpheniramine-HYDROcodone  (TUSSIONEX) 10-8 MG/5ML Take 5 mLs by mouth every 12 (twelve) hours as needed for cough. 115 mL 0   diclofenac sodium (VOLTAREN) 1 % GEL Apply 2 g topically 2 (two) times daily as needed.     diphenoxylate -atropine  (LOMOTIL ) 2.5-0.025 MG tablet TAKE 2 TABLETS BY MOUTH 4 (FOUR) TIMES DAILY AS NEEDED FOR DIARRHEA OR LOOSE STOOLS. 60 tablet 1   Fluticasone Furoate  (ARNUITY ELLIPTA ) 100 MCG/ACT AEPB  Inhale 1 puff into the lungs daily. 30 each 6   gabapentin  (NEURONTIN ) 300 MG capsule TAKE 1 CAPSULE BY MOUTH TWICE A DAY 60 capsule 2   HYDROcodone -acetaminophen  (NORCO/VICODIN) 5-325 MG tablet Take 1 tablet by mouth every 6 (six) hours as needed for moderate pain (pain score 4-6). 30 tablet 0   levothyroxine  (SYNTHROID ) 25 MCG tablet TAKE 1 TABLET BY MOUTH DAILY BEFORE BREAKFAST. 30 tablet 0   lidocaine -prilocaine  (EMLA ) cream Apply 1 Application topically as needed. Apply to port 1 hour prior to use as needed 30 g 2   megestrol  (MEGACE ) 40 MG tablet TAKE 1 TABLET BY MOUTH EVERY DAY 90 tablet 1   metoprolol  succinate (TOPROL -XL) 25 MG 24 hr tablet Take 25 mg by mouth daily.     pantoprazole  (PROTONIX ) 20 MG tablet Take 1 tablet by mouth daily as needed for heartburn.     potassium chloride  SA (KLOR-CON  M) 20 MEQ tablet TAKE 1 TABLET BY MOUTH TWICE A DAY 180 tablet 1   simvastatin  (ZOCOR ) 20 MG tablet Take 20 mg  by mouth at bedtime.     traMADol-acetaminophen  (ULTRACET) 37.5-325 MG tablet Take 1 tablet by mouth every 8 (eight) hours as needed.     No current facility-administered medications for this visit.   Facility-Administered Medications Ordered in Other Visits  Medication Dose Route Frequency Provider Last Rate Last Admin   0.9 %  sodium chloride  infusion   Intravenous Once Adrian Alba, Joyann Spidle J, MD       heparin  lock flush 100 unit/mL  500 Units Intravenous Once Lenus Trauger J, MD       heparin  lock flush 100 unit/mL  500 Units Intracatheter PRN Adrian Alba, Deadra Everts, MD       heparin  lock flush 100 unit/mL  500 Units Intravenous Once Vanda Waskey J, MD       heparin  lock flush 100 unit/mL  500 Units Intravenous Once Saketh Daubert J, MD       heparin  lock flush 100 unit/mL  500 Units Intracatheter Once PRN Lillyana Majette J, MD       sodium chloride  flush (NS) 0.9 % injection 10 mL  10 mL Intravenous PRN Everline Mahaffy J, MD   10 mL at 12/04/17 0901   sodium chloride   flush (NS) 0.9 % injection 10 mL  10 mL Intracatheter PRN Shellie Dials, MD       sodium chloride  flush (NS) 0.9 % injection 10 mL  10 mL Intravenous PRN Keysha Damewood J, MD   10 mL at 04/24/21 0839   sodium chloride  flush (NS) 0.9 % injection 10 mL  10 mL Intracatheter Once PRN Jeanni Allshouse J, MD       yttrium-90 injection 22.3 millicurie  22.3 millicurie Intravenous Once Chrystal, Glenn, MD        OBJECTIVE: Vitals:   03/04/24 0917  BP: 116/77  Pulse: (!) 105  Resp: 16  Temp: 97.9 F (36.6 C)  SpO2: 100%       Body mass index is 17.58 kg/m.    ECOG FS:2 - Symptomatic, <50% confined to bed  General: Thin, no acute distress. Eyes: Pink conjunctiva, anicteric sclera. HEENT: Normocephalic, moist mucous membranes. Lungs: No audible wheezing or coughing. Heart: Regular rate and rhythm. Abdomen: Soft, nontender, no obvious distention. Musculoskeletal: No edema, cyanosis, or clubbing. Neuro: Alert, answering all questions appropriately. Cranial nerves grossly intact. Skin: No rashes or petechiae noted. Psych: Normal affect.   LAB RESULTS:  Lab Results  Component Value Date   NA 136 03/04/2024   K 3.4 (L) 03/04/2024   CL 102 03/04/2024   CO2 24 03/04/2024   GLUCOSE 130 (H) 03/04/2024   BUN 11 03/04/2024   CREATININE 0.87 03/04/2024   CALCIUM  8.6 (L) 03/04/2024   PROT 6.3 (L) 03/04/2024   ALBUMIN 3.3 (L) 03/04/2024   AST 17 03/04/2024   ALT 10 03/04/2024   ALKPHOS 65 03/04/2024   BILITOT 0.8 03/04/2024   GFRNONAA >60 03/04/2024   GFRAA >60 06/28/2020    Lab Results  Component Value Date   WBC 5.2 03/04/2024   NEUTROABS 2.6 03/04/2024   HGB 11.4 (L) 03/04/2024   HCT 36.1 03/04/2024   MCV 93.5 03/04/2024   PLT 117 (L) 03/04/2024     STUDIES: US  Venous Img Upper Uni Left (DVT) Result Date: 02/21/2024 CLINICAL DATA:  Left upper extremity pain and edema. EXAM: LEFT UPPER EXTREMITY VENOUS DOPPLER ULTRASOUND TECHNIQUE: Gray-scale sonography with  graded compression, as well as color Doppler and duplex ultrasound were performed to evaluate the upper extremity deep venous system from the  level of the subclavian vein and including the jugular, axillary, basilic, radial, ulnar and upper cephalic vein. Spectral Doppler was utilized to evaluate flow at rest and with distal augmentation maneuvers. COMPARISON:  None Available. FINDINGS: Contralateral Subclavian Vein: Respiratory phasicity is normal and symmetric with the symptomatic side. No evidence of thrombus. Normal compressibility. Internal Jugular Vein: No evidence of thrombus. Normal compressibility, respiratory phasicity and response to augmentation. Subclavian Vein: No evidence of thrombus. Normal compressibility, respiratory phasicity and response to augmentation. Axillary Vein: No evidence of thrombus. Normal compressibility, respiratory phasicity and response to augmentation. Cephalic Vein: No evidence of thrombus. Normal compressibility, respiratory phasicity and response to augmentation. Basilic Vein: No evidence of thrombus. Normal compressibility, respiratory phasicity and response to augmentation. Brachial Veins: No evidence of thrombus. Normal compressibility, respiratory phasicity and response to augmentation. Radial Veins: No evidence of thrombus. Normal compressibility, respiratory phasicity and response to augmentation. Ulnar Veins: No evidence of thrombus. Normal compressibility, respiratory phasicity and response to augmentation. Venous Reflux:  None visualized. Other Findings: No evidence of superficial thrombophlebitis or abnormal fluid collection. IMPRESSION: No evidence of DVT within the left upper extremity. Electronically Signed   By: Erica Hau M.D.   On: 02/21/2024 17:00   CT CHEST ABDOMEN PELVIS W CONTRAST Result Date: 02/16/2024 CLINICAL DATA:  Follow-up rectal cancer, lung lesions, status post right lower lobe lung nodule SBRT, additional history of follicular lymphoma *  Tracking Code: BO * EXAM: CT CHEST, ABDOMEN, AND PELVIS WITH CONTRAST TECHNIQUE: Multidetector CT imaging of the chest, abdomen and pelvis was performed following the standard protocol during bolus administration of intravenous contrast. RADIATION DOSE REDUCTION: This exam was performed according to the departmental dose-optimization program which includes automated exposure control, adjustment of the mA and/or kV according to patient size and/or use of iterative reconstruction technique. CONTRAST:  80mL ISOVUE -300 IOPAMIDOL  (ISOVUE -300) INJECTION 61% additional oral enteric contrast COMPARISON:  11/11/2023 FINDINGS: CT CHEST FINDINGS Cardiovascular: Aortic atherosclerosis. Mild cardiomegaly. Left coronary artery calcifications no pericardial effusion. Mediastinum/Nodes: No enlarged mediastinal, hilar, or axillary lymph nodes. Thyroid  gland, trachea, and esophagus demonstrate no significant findings. Lungs/Pleura: Large mass of the infrahilar right lower lobe obstructing the airways with nearly complete atelectasis or consolidation of the right lower lobe increased in size, measuring 5.0 x 4.8 cm, previously 3.7 x 3.0 cm when measured similarly (series 2, image 33). Numerous bilateral pulmonary masses and nodules are significantly increased in size, index nodule in the anterior right upper lobe measuring 2.4 x 1.3 cm, previously 1.6 x 0.7 cm (series 3, image 36), additional index mass in the dependent left lower lobe measuring 3.7 x 2.9 cm, previously 2.5 x 1.7 cm (series 3, image 79). New, diffusely scattered ground-glass airspace opacity throughout the lungs. Musculoskeletal: Pectus deformity.  No acute osseous findings. CT ABDOMEN PELVIS FINDINGS Hepatobiliary: No solid liver abnormality is seen. Contracted gallbladder. No gallstones, gallbladder wall thickening, or biliary dilatation. Pancreas: Unremarkable. No pancreatic ductal dilatation or surrounding inflammatory changes. Spleen: Normal in size without  significant abnormality. Adrenals/Urinary Tract: Adrenal glands are unremarkable. Kidneys are normal, without renal calculi, solid lesion, or hydronephrosis. Bladder is unremarkable. Stomach/Bowel: Stomach is within normal limits. Appendix not clearly visualized. Large burden of stool throughout the colon. Evaluation of the low pelvis significantly limited by dense metallic streak artifact from adjacent hip arthroplasty. Within this limitation, similar appearance of circumferential mass of the low rectum (series 2, image 77) Vascular/Lymphatic: No significant vascular findings are present. No enlarged abdominal or pelvic lymph nodes. Reproductive: No mass or  other abnormality. Other: No abdominal wall hernia or abnormality. No ascites. Musculoskeletal: No acute osseous findings. Status post left hip total arthroplasty. IMPRESSION: 1. Increased size of a large mass of the infrahilar right lower lobe. Numerous bilateral pulmonary masses and nodules are likewise significantly increased in size. 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 2. Evaluation of the low pelvis significantly limited by dense metallic streak artifact from adjacent hip arthroplasty. Within this limitation, similar appearance of circumferential mass of the low rectum. 3. New, diffusely scattered nonspecific infectious or inflammatory ground-glass airspace opacity throughout the lungs. Drug toxicity a significant differential consideration in the setting of chemotherapy or immunotherapy. 4. Coronary artery disease. Aortic Atherosclerosis (ICD10-I70.0). Electronically Signed   By: Fredricka Jenny M.D.   On: 02/16/2024 16:46     ONCOLOGY HISTORY:  Biopsy confirmed rectal cancer.  MRI completed at [facility] on October 11, 2020 revealed extension of the tumor beyond the wall into the rectovaginal recess with suspected invasion of the vaginal wall posteriorly.  Tumor also appears to involve the internal anal sphincter.   There are also numerous prominent presacral and mesorectal lymph nodes highly suspicious for malignancy.  PET scan results from November 17, 2020 reviewed independently with 3 hypermetabolic right lower lobe lung nodules consistent with pulmonary metastasis.  Patient completed cycle 8 of FOLFOX on Mar 29, 2021.  She then completed cycle 5 of her 5-FU pump on May 26, 2021 and XRT on June 01, 2021.  PET scan results from July 17, 2021 reviewed independently with no evidence of disease other than enlarging right lower lobe lung lesion.  Patient completed SBRT to the right lung lesion on August 22, 2021.  Repeat PET scan on July 25, 2021 revealed minimal residual disease in the rectum and likely resolution of lesions in the lung.  PET scan results from Mar 21, 2023 reviewed independently with clear progression of disease in patient's pelvis as well as multiple pulmonary nodules.  Patient subsequently was placed on FOLFOX plus Avastin  again between April 10, 2023 and October 16, 2023.  CT scan results from November 11, 2023 with significant improvement of disease burden except for her lung masses which have increased in size.  Circular gene testing was positive for PD-L1 expression as well as ATM and CHEK2 mutations. Given the PD-L1 positivity, patient has agreed to pursue treatment with single agent Keytruda  which she received from November 27, 2023 through January 29, 2024.  Patient was once again noted to have progression of disease.  ASSESSMENT: Recurrent stage IV rectal cancer, history of follicular lymphoma.  PLAN:    Recurrent stage IV rectal cancer: See oncology history as above.  Keytruda  has been discontinued.  Patient has never received irinotecan  based therapy, so we will proceed with FOLFIRI. Will dose reduce treatment 25% from the onset.  Patient will also receive 1 L of IV fluids as well as Udenyca  with her pump removal 2 days later.  This will be a 21-day cycle.  She tolerated cycle 1  relatively well.  Proceed with IV fluids today.  Return to clinic in 2 weeks for further evaluation and consideration of cycle 2.  Will reimage after 4 cycles. Left lower lobe lung lesion: It is possible her lung lesions are second primary originating in lung, but previous biopsy from bronchoscopy did not reveal any evidence of malignancy.  Will treat as metastatic rectal cancer, although irinotecan  may have some effectiveness in lung malignancy. Recurrent follicular lymphoma: CT scan results from December 23, 2019 reviewed independently with no obvious evidence of recurrent or progressive disease.  PET scan results as above consistent with rectal cancer metastasis and no evidence of lymphoma.   Patient received Zevalin  on July 30, 2017 with not much therapeutic effect.  She subsequently underwent cycles 5 of R-CHOP chemotherapy with Neulasta  support completing on December 12, 2017.  Patient continues to be in complete remission.  Neuropathy: Chronic and unchanged. Pain: Patient does not complain of this today.  Continue tramadol and/or Norco as needed.   Poor appetite/weight loss: Improved.  Patient has gained weight in the interim.  Unclear if patient is still taking Megace .   Coping/adjustment: Patient was previously given a referral to [facility]. Atrial fibrillation: Appreciate cardiology input.  Patient now on amiodarone . Pelvic fracture: No interval needed at this time.  Continue symptomatic treatment with tramadol as above. Cough: Chronic and unchanged.  Continue Tussionex as prescribed. Elevated TSH: Improving.   Diarrhea: Patient will require atropine  as ordered with her treatments.  Continue Lomotil  as prescribed. Hypokalemia: Mild, monitor.  Continue oral potassium supplementation. Thrombocytopenia: Mild, monitor.   Patient expressed understanding and was in agreement with this plan. She also understands that She can call clinic at any time with any questions, concerns, or  complaints.    Cancer Staging  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 [facility]) Staging form: Lymphoid Neoplasms, AJCC 6th Edition - Clinical stage from 08/27/2016: Stage IIE - Signed by Shellie Dials, MD on 08/27/2016  Rectal cancer [facility]) Staging form: Colon and Rectum, AJCC 8th Edition - Clinical: Stage IVA [name], Day[place].Dage) - Signed by Shellie Dials, MD on 11/02/2020   Shellie Dials, MD   03/04/2024 10:14 AM

## 2024-03-04 NOTE — Patient Instructions (Signed)
 Dehydration, Adult Dehydration is a condition in which there is not enough water or other fluids in the body. This happens when a person loses more fluids than they take in. Important organs cannot work right without the right amount of fluids. Any loss of fluids from the body can cause dehydration. Dehydration can be mild, worse, or very bad. It should be treated right away to keep it from getting very bad. What are the causes? Conditions that cause loss of water in the body. They include: Watery poop (diarrhea). Vomiting. Sweating a lot. Fever. Infection. Peeing (urinating) a lot. Not drinking enough fluids. Certain medicines, such as medicines that take extra fluid out of the body (diuretics). Lack of safe drinking water. Not being able to get enough water and food. What increases the risk? Having a long-term (chronic) illness that has not been treated the right way, such as: Diabetes. Heart disease. Kidney disease. Being 25 years of age or older. Having a disability. Living in a place that is high above the ground or sea (high in altitude). The thinner, drier air causes more fluid loss. Doing exercises that put stress on your body for a long time. Being active when in hot places. What are the signs or symptoms? Symptoms of dehydration depend on how bad it is. Mild or worse dehydration Thirst. Dry lips or dry mouth. Feeling dizzy or light-headed. Muscle cramps. Passing little pee or dark pee. Pee may be the color of tea. Headache. Very bad dehydration Changes in skin. Skin may: Be cold to the touch (clammy). Be blotchy or pale. Not go back to normal right after you pinch it and let it go. Little or no tears, pee, or sweat. Fast breathing. Low blood pressure. Weak pulse. Pulse that is more than 100 beats a minute when you are sitting still. Other changes, such as: Feeling very thirsty. Eyes that look hollow (sunken). Cold hands and feet. Being confused. Being very  tired (lethargic) or having trouble waking from sleep. Losing weight. Loss of consciousness. How is this treated? Treatment for this condition depends on how bad your dehydration is. Treatment should start right away. Do not wait until your condition gets very bad. Very bad dehydration is an emergency. You will need to go to a hospital. Mild or worse dehydration can be treated at home. You may be asked to: Drink more fluids. Drink an oral rehydration solution (ORS). This drink gives you the right amount of fluids, salts, and minerals (electrolytes). Very bad dehydration can be treated: With fluids through an IV tube. By correcting low levels of electrolytes in the body. By treating the problem that caused your dehydration. Follow these instructions at home: Oral rehydration solution If told by your doctor, drink an ORS: Make an ORS. Use instructions on the package. Start by drinking small amounts, about  cup (120 mL) every 5-10 minutes. Slowly drink more until you have had the amount that your doctor said to have.  Eating and drinking  Drink enough clear fluid to keep your pee pale yellow. If you were told to drink an ORS, finish the ORS first. Then, start slowly drinking other clear fluids. Drink fluids such as: Water. Do not drink only water. Doing that can make the salt (sodium) level in your body get too low. Water from ice chips you suck on. Fruit juice that you have added water to (diluted). Low-calorie sports drinks. Eat foods that have the right amounts of salts and minerals, such as bananas, oranges, potatoes,  tomatoes, or spinach. Do not drink alcohol. Avoid drinks that have caffeine or sugar. These include:: High-calorie sports drinks. Fruit juice that you did not add water to. Soda. Coffee or energy drinks. Avoid foods that are greasy or have a lot of fat or sugar. General instructions Take over-the-counter and prescription medicines only as told by your doctor. Do  not take sodium tablets. Doing that can make the salt level in your body get too high. Return to your normal activities as told by your doctor. Ask your doctor what activities are safe for you. Keep all follow-up visits. Your doctor may check and change your treatment. Contact a doctor if: You have pain in your belly (abdomen) and the pain: Gets worse. Stays in one place. You have a rash. You have a stiff neck. You get angry or annoyed more easily than normal. You are more tired or have a harder time waking than normal. You feel weak or dizzy. You feel very thirsty. Get help right away if: You have any symptoms of very bad dehydration. You vomit every time you eat or drink. Your vomiting gets worse, does not go away, or you vomit blood or green stuff. You are getting treatment, but symptoms are getting worse. You have a fever. You have a very bad headache. You have: Diarrhea that gets worse or does not go away. Blood in your poop (stool). This may cause poop to look black and tarry. No pee in 6-8 hours. Only a small amount of pee in 6-8 hours, and the pee is very dark. You have trouble breathing. These symptoms may be an emergency. Get help right away. Call 911. Do not wait to see if the symptoms will go away. Do not drive yourself to the hospital. This information is not intended to replace advice given to you by your health care provider. Make sure you discuss any questions you have with your health care provider. Document Revised: 05/21/2022 Document Reviewed: 05/21/2022 Elsevier Patient Education  2024 [employer].

## 2024-03-04 NOTE — Progress Notes (Signed)
 Is asking for anti diarrhea medication prior to next treatment. States she had really bad diarrhea after her last tx.

## 2024-03-04 NOTE — Telephone Encounter (Signed)
 Intermittent FMLA paperwork for patient's daughter, [name], completed and faxed to [employer]

## 2024-03-08 ENCOUNTER — Other Ambulatory Visit: Payer: Self-pay | Admitting: Cardiology

## 2024-03-09 ENCOUNTER — Other Ambulatory Visit: Payer: Self-pay | Admitting: Oncology

## 2024-03-09 MED ORDER — HYDROCOD POLI-CHLORPHE POLI ER 10-8 MG/5ML PO SUER: 5.0000 mL | Freq: Two times a day (BID) | ORAL | 0 refills | Status: DC | PRN

## 2024-03-10 ENCOUNTER — Encounter: Payer: Self-pay | Admitting: Pulmonary Disease

## 2024-03-10 ENCOUNTER — Ambulatory Visit (INDEPENDENT_AMBULATORY_CARE_PROVIDER_SITE_OTHER): Admitting: Pulmonary Disease

## 2024-03-10 VITALS — BP 126/84 | HR 92 | Temp 97.8°F | Ht <= 58 in | Wt 82.2 lb

## 2024-03-10 DIAGNOSIS — C20 Malignant neoplasm of rectum: Secondary | ICD-10-CM | POA: Diagnosis not present

## 2024-03-10 DIAGNOSIS — C7800 Secondary malignant neoplasm of unspecified lung: Secondary | ICD-10-CM

## 2024-03-10 DIAGNOSIS — C8208 Follicular lymphoma grade I, lymph nodes of multiple sites: Secondary | ICD-10-CM | POA: Diagnosis not present

## 2024-03-10 DIAGNOSIS — J9801 Acute bronchospasm: Secondary | ICD-10-CM

## 2024-03-10 DIAGNOSIS — J4530 Mild persistent asthma, uncomplicated: Secondary | ICD-10-CM | POA: Diagnosis not present

## 2024-03-10 MED ORDER — TRELEGY ELLIPTA 100-62.5-25 MCG/ACT IN AEPB: 1.0000 | INHALATION_SPRAY | Freq: Every day | RESPIRATORY_TRACT | Status: DC

## 2024-03-10 MED ORDER — LEVALBUTEROL HCL 0.63 MG/3ML IN NEBU
0.6300 mg | INHALATION_SOLUTION | Freq: Once | RESPIRATORY_TRACT | Status: AC
  Administered 2024-03-10: 0.63 mg via RESPIRATORY_TRACT

## 2024-03-10 NOTE — Progress Notes (Signed)
 Subjective:    Patient ID: Veronica Boyd, female    DOB: February 09, 1946, 78 y.o.   MRN: [id number]  Patient Care Team: Antonio Baumgarten, MD as PCP - General (Internal Medicine) Devorah Fonder, MD as PCP - Cardiology (Cardiology) Ardeen Kohler, MD as PCP - Electrophysiology (Cardiology) Shellie Dials, MD as Consulting Physician (Oncology) Rochell Chroman, RN as Oncology Nurse Navigator Marc Senior, MD as Consulting Physician (Pulmonary Disease)  Chief Complaint  Patient presents with   Follow-up    Cough. Occasional shortness of breath and wheezing.     BACKGROUND/INTERVAL: Veronica Boyd is a 78 year old lifelong never smoker who follows up for pulmonary nodules in the setting of stage IV rectal cancer and a history of follicular lymphoma.  She also has a chronic cough.  We last saw the patient on 06 January 2024.  This is a scheduled follow-up visit.  She is currently receiving a new regimen of chemotherapy for her stage IV rectal cancer.  Her pulmonary metastasis have been growing.  HPI Discussed the use of AI scribe software for clinical note transcription with the patient, who gave verbal consent to proceed.  History of Present Illness   Veronica Boyd "Mae" is an 78 year old female who presents with respiratory symptoms and diarrhea.  She is accompanied by her daughter.  She experiences severe diarrhea as a side effect of her current chemotherapy regimen, which significantly impacts her ability to sit comfortably. Despite being prescribed medication for diarrhea, it has not been fully effective.  She has a persistent cough that varies in severity, sometimes worsening at night. She uses cough syrup to manage the symptoms, which helps reduce the coughing. Occasionally, the cough is severe enough to cause a sore throat, making it difficult to drink. No sputum production is noted with the cough.  She experiences wheezing and reports pain when taking a deep  breath. She is unsure when the wheezing started but mentions that her breathing is usually clear in the morning.  She is supposed to be on Arnuity Ellipta  which has controlled her symptoms previously but she has discontinued this medication.  She has a history of heart issues (A-fib), which are currently controlled.  She spends a significant amount of time in bed, which her daughter is concerned that could lead to pneumonia.   She is very debilitated and appears to be more frail than usual.   Review of Systems A 10 point review of systems was performed and it is as noted above otherwise negative.   Patient Active Problem List   Diagnosis Date Noted   Paroxysmal atrial fibrillation (HCC) 07/02/2023   Hypercoagulable state due to paroxysmal atrial fibrillation (HCC) 07/02/2023   Lung mass 10/05/2022   Bronchial obstruction 10/05/2022   Chemotherapy-induced neuropathy (HCC) 07/21/2021   Rectal cancer (HCC) 11/02/2020   Rectal mass 10/11/2020   Gastroesophageal reflux disease without esophagitis 08/25/2018   Protein-calorie malnutrition, severe 12/20/2017   Anemia    Thrombocytopenia (HCC)    Sepsis (HCC) 12/19/2017   Anemia due to antineoplastic chemotherapy 12/17/2017   Goals of care, counseling/discussion 09/19/2017   Axillary mass, right 04/03/2017   Pure hypercholesterolemia 09/10/2016   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) 07/24/2016   Mass of right chest wall 06/06/2016   Primary osteoarthritis involving multiple joints 12/14/2015   Transient insomnia 08/27/2015   Mixed hyperlipidemia 03/01/2015   Nocturnal muscle cramps 03/01/2015   Osteoporosis 11/08/2014  Primary osteoarthritis of both knees 07/05/2014   Hyperlipidemia 03/19/2014   Osteoarthritis of knee 03/19/2014   Abnormal mammogram 03/03/2013   Vitamin D deficiency 05/09/2012    Social History   Tobacco Use   Smoking status: Never    Smokeless tobacco: Never  Substance Use Topics   Alcohol use: No    No Known Allergies  Current Meds  Medication Sig   amiodarone  (PACERONE ) 200 MG tablet Take 1 tablet (200 mg) by mouth twice daily for 2 weeks, then decrease to 1 tablet (200 mg) by mouth daily.   apixaban  (ELIQUIS ) 2.5 MG TABS tablet Take 1 tablet (2.5 mg total) by mouth 2 (two) times daily.   chlorpheniramine-HYDROcodone  (TUSSIONEX) 10-8 MG/5ML Take 5 mLs by mouth every 12 (twelve) hours as needed for cough.   diclofenac sodium (VOLTAREN) 1 % GEL Apply 2 g topically 2 (two) times daily as needed.   diphenoxylate -atropine  (LOMOTIL ) 2.5-0.025 MG tablet TAKE 2 TABLETS BY MOUTH 4 (FOUR) TIMES DAILY AS NEEDED FOR DIARRHEA OR LOOSE STOOLS.   Fluticasone-Umeclidin-Vilant (TRELEGY ELLIPTA ) 100-62.5-25 MCG/ACT AEPB Inhale 1 Inhalation into the lungs daily in the afternoon.   gabapentin  (NEURONTIN ) 300 MG capsule TAKE 1 CAPSULE BY MOUTH TWICE A DAY   HYDROcodone -acetaminophen  (NORCO/VICODIN) 5-325 MG tablet Take 1 tablet by mouth every 6 (six) hours as needed for moderate pain (pain score 4-6).   levothyroxine  (SYNTHROID ) 25 MCG tablet TAKE 1 TABLET BY MOUTH EVERY DAY BEFORE BREAKFAST   lidocaine -prilocaine  (EMLA ) cream Apply 1 Application topically as needed. Apply to port 1 hour prior to use as needed   megestrol  (MEGACE ) 40 MG tablet TAKE 1 TABLET BY MOUTH EVERY DAY   metoprolol  succinate (TOPROL -XL) 25 MG 24 hr tablet Take 25 mg by mouth daily.   pantoprazole  (PROTONIX ) 20 MG tablet Take 1 tablet by mouth daily as needed for heartburn.   potassium chloride  SA (KLOR-CON  M) 20 MEQ tablet TAKE 1 TABLET BY MOUTH TWICE A DAY   simvastatin  (ZOCOR ) 20 MG tablet Take 20 mg by mouth at bedtime.   traMADol-acetaminophen  (ULTRACET) 37.5-325 MG tablet Take 1 tablet by mouth every 8 (eight) hours as needed.    Immunization History  Administered Date(s) Administered   Influenza-Unspecified 09/20/2014   PFIZER(Purple Top)SARS-COV-2  Vaccination 01/07/2020, 02/02/2020   Unspecified SARS-COV-2 Vaccination 02/02/2020   Zoster Recombinant(Shingrix) 11/29/2019, 03/20/2020   Zoster, Live 09/20/2014        Objective:     BP 126/84 (BP Location: Left Arm, Patient Position: Sitting, Cuff Size: Small)   Pulse 92   Temp 97.8 F (36.6 C) (Temporal)   Ht 4\' 10"  (1.473 m)   Wt 82 lb 3.2 oz (37.3 kg)   SpO2 95%   BMI 17.18 kg/m   SpO2: 95 %  GENERAL: Thin, elderly female, pale, presents in transport chair.  No conversational dyspnea. HEAD: Normocephalic, atraumatic.  EYES: Pupils equal, round, reactive to light.  No scleral icterus.  MOUTH: Wearing mask today. NECK: Supple. No thyromegaly. Trachea midline. No JVD.  No adenopathy. PULMONARY: Pectus excavatum deformity, good air entry bilaterally.  Scattered wheezing throughout. CARDIOVASCULAR: S1 and S2.  Regular rate and rhythm.  No rubs, murmurs or gallops heard. ABDOMEN: Scaphoid otherwise benign. MUSCULOSKELETAL: No joint deformity, no clubbing, no edema.  NEUROLOGIC: Grossly nonfocal.  Presents in transport chair, not tested.  Speech is fluent. SKIN: Intact,warm,dry. PSYCH: Mood and behavior normal.  Reviewed 12 February 2024 CT chest abdomen and pelvis independently: Large mass of the infra hilar right lower lobe obstructing  the airways with nearly complete atelectasis or consolidation of the right lower lobe this has increased in size.  Numerous bilateral pulmonary masses and nodules also significantly increased in size.  There is also scattered groundglass airspace opacity throughout the lungs.     Patient received nebulization treatment with Xopenex  0.63 mg x 1.  After nebulization therapy, wheezing resolved.   Assessment & Plan:     ICD-10-CM   1. 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 Mild persistent asthmatic bronchitis without complication   J45.30     4. Bronchospasm  J98.01      Meds ordered this encounter  Medications   Fluticasone-Umeclidin-Vilant (TRELEGY ELLIPTA ) 100-62.5-25 MCG/ACT AEPB    Sig: Inhale 1 Inhalation into the lungs daily in the afternoon.    Dispense:  2 each    Lot Number?:   [id number]    Expiration Date?:   06/05/2025    Manufacturer?:   Glax[employer]Smith[employer]line [[id number]]    NDC:   7829-5621-30 [865784]    Quantity:   2   Discussion:    Wheezing and cough Intermittent nocturnal wheezing and cough, sometimes severe, without sputum production. Wheezing present on examination. Symptoms likely related to an underlying pulmonary condition. Nebulizer treatment during visit improved wheezing. A once-daily inhaler was chosen for ongoing management due to ease of use. - Provide inhaler for once-daily use (Trelegy 100, 1 inhalation daily) - Instruct to rinse mouth after inhaler use with water or water with baking soda - Follow up in 4-6 weeks to assess inhaler response  Diarrhea due to chemotherapy Severe diarrhea secondary to chemotherapy, causing significant discomfort and impacting daily activities. Current antidiarrheal medication is insufficient. - Prescribe additional medication to manage diarrhea - per Dr. Adrian Alba   Advanced debility and frailty Patient currently cannot tolerate bronchoscopy under general anesthesia.  Had poor tolerance of the procedure at her last attempt.  Patient's cancer has been recalcitrant to various chemotherapeutic modalities consider evaluation by Palliative Care/Hospice.  Advised if symptoms do not improve or worsen, to please contact office for sooner follow up or seek emergency care.    I spent 40 minutes of dedicated to the care of this patient on the date of this encounter to include pre-visit review of records, face-to-face time with the patient discussing conditions above, post visit ordering of testing, clinical documentation with the electronic health record, making appropriate  referrals as documented, and communicating necessary findings to members of the patients care team.     C. Chloe Counter, MD Advanced Bronchoscopy PCCM [facility]    *This note was generated using voice recognition software/Dragon and/or AI transcription program.  Despite best efforts to proofread, errors can occur which can change the meaning. Any transcriptional errors that result from this process are unintentional and may not be fully corrected at the time of dictation.

## 2024-03-10 NOTE — Patient Instructions (Signed)
 VISIT SUMMARY:  During your visit, we discussed your respiratory symptoms and diarrhea. We addressed your wheezing and cough, which are likely related to an underlying lung condition, and we also talked about the severe diarrhea you are experiencing as a side effect of your chemotherapy.  YOUR PLAN:  -WHEEZING AND COUGH: Wheezing and coughing can be symptoms of an underlying lung condition.  Suspect you have some asthmatic bronchitis.  We provided you with a once-daily inhaler to help manage these symptoms. Please remember to rinse your mouth with water or water mixed with baking soda after using the inhaler. We will follow up in 4-6 weeks to see how you are responding to the inhaler.  -DIARRHEA DUE TO CHEMOTHERAPY: Diarrhea can be a side effect of chemotherapy. Since your current medication is not fully effective, we have prescribed an additional medication to help manage your diarrhea.  INSTRUCTIONS:  Please follow up in 4-6 weeks to assess your response to the inhaler. If your symptoms worsen or you have any concerns, contact our office immediately.  Let us  know if the inhaler is working for you so we can call in a prescription for you.

## 2024-03-11 ENCOUNTER — Ambulatory Visit: Admitting: Oncology

## 2024-03-11 ENCOUNTER — Encounter: Payer: Self-pay | Admitting: Oncology

## 2024-03-11 ENCOUNTER — Ambulatory Visit

## 2024-03-11 ENCOUNTER — Other Ambulatory Visit: Payer: Self-pay

## 2024-03-11 ENCOUNTER — Other Ambulatory Visit

## 2024-03-12 ENCOUNTER — Telehealth: Payer: Self-pay | Admitting: *Deleted

## 2024-03-12 NOTE — Telephone Encounter (Signed)
 The daughter called today checking on the FMLA papers and that was sent and on April 30 so I will let her know about that.  Along with that the daughter says that [name] is saying that her bottom is hurting but she does not want to do anything about it.  The daughter said what does the [place] team say about this. Then the daughter said that she says today that after she looks at her TV shows she will let her daughter to get in bath tub and she will try to look if she has a wound or redness on bottom. She will call back to us .

## 2024-03-13 ENCOUNTER — Ambulatory Visit

## 2024-03-13 ENCOUNTER — Encounter: Payer: Self-pay | Admitting: Pulmonary Disease

## 2024-03-18 ENCOUNTER — Inpatient Hospital Stay

## 2024-03-18 ENCOUNTER — Inpatient Hospital Stay (HOSPITAL_BASED_OUTPATIENT_CLINIC_OR_DEPARTMENT_OTHER): Admitting: Oncology

## 2024-03-18 ENCOUNTER — Inpatient Hospital Stay: Attending: Oncology

## 2024-03-18 ENCOUNTER — Other Ambulatory Visit: Payer: Self-pay | Admitting: *Deleted

## 2024-03-18 VITALS — BP 108/73 | HR 85

## 2024-03-18 VITALS — BP 124/73 | HR 106 | Temp 97.6°F | Resp 28 | Ht <= 58 in | Wt 76.5 lb

## 2024-03-18 DIAGNOSIS — C20 Malignant neoplasm of rectum: Secondary | ICD-10-CM | POA: Insufficient documentation

## 2024-03-18 DIAGNOSIS — Z79899 Other long term (current) drug therapy: Secondary | ICD-10-CM | POA: Insufficient documentation

## 2024-03-18 DIAGNOSIS — R634 Abnormal weight loss: Secondary | ICD-10-CM | POA: Insufficient documentation

## 2024-03-18 DIAGNOSIS — R630 Anorexia: Secondary | ICD-10-CM | POA: Diagnosis not present

## 2024-03-18 DIAGNOSIS — Z5189 Encounter for other specified aftercare: Secondary | ICD-10-CM | POA: Insufficient documentation

## 2024-03-18 DIAGNOSIS — Z5111 Encounter for antineoplastic chemotherapy: Secondary | ICD-10-CM | POA: Insufficient documentation

## 2024-03-18 DIAGNOSIS — C7801 Secondary malignant neoplasm of right lung: Secondary | ICD-10-CM | POA: Diagnosis present

## 2024-03-18 DIAGNOSIS — Z95828 Presence of other vascular implants and grafts: Secondary | ICD-10-CM

## 2024-03-18 LAB — CBC WITH DIFFERENTIAL (CANCER CENTER ONLY)
Abs Immature Granulocytes: 0.09 10*3/uL — ABNORMAL HIGH (ref 0.00–0.07)
Basophils Absolute: 0.1 10*3/uL (ref 0.0–0.1)
Basophils Relative: 1 %
Eosinophils Absolute: 0.1 10*3/uL (ref 0.0–0.5)
Eosinophils Relative: 1 %
HCT: 37.8 % (ref 36.0–46.0)
Hemoglobin: 11.9 g/dL — ABNORMAL LOW (ref 12.0–15.0)
Immature Granulocytes: 1 %
Lymphocytes Relative: 13 %
Lymphs Abs: 1.6 10*3/uL (ref 0.7–4.0)
MCH: 28.9 pg (ref 26.0–34.0)
MCHC: 31.5 g/dL (ref 30.0–36.0)
MCV: 91.7 fL (ref 80.0–100.0)
Monocytes Absolute: 1.6 10*3/uL — ABNORMAL HIGH (ref 0.1–1.0)
Monocytes Relative: 13 %
Neutro Abs: 9.1 10*3/uL — ABNORMAL HIGH (ref 1.7–7.7)
Neutrophils Relative %: 71 %
Platelet Count: 366 10*3/uL (ref 150–400)
RBC: 4.12 MIL/uL (ref 3.87–5.11)
RDW: 16.1 % — ABNORMAL HIGH (ref 11.5–15.5)
WBC Count: 12.6 10*3/uL — ABNORMAL HIGH (ref 4.0–10.5)
nRBC: 0 % (ref 0.0–0.2)

## 2024-03-18 LAB — CMP (CANCER CENTER ONLY)
ALT: 12 U/L (ref 0–44)
AST: 21 U/L (ref 15–41)
Albumin: 3.2 g/dL — ABNORMAL LOW (ref 3.5–5.0)
Alkaline Phosphatase: 70 U/L (ref 38–126)
Anion gap: 13 (ref 5–15)
BUN: 17 mg/dL (ref 8–23)
CO2: 24 mmol/L (ref 22–32)
Calcium: 8.5 mg/dL — ABNORMAL LOW (ref 8.9–10.3)
Chloride: 97 mmol/L — ABNORMAL LOW (ref 98–111)
Creatinine: 0.86 mg/dL (ref 0.44–1.00)
GFR, Estimated: >60 mL/min (ref >60)
Glucose, Bld: 154 mg/dL — ABNORMAL HIGH (ref 70–99)
Potassium: 3.6 mmol/L (ref 3.5–5.1)
Sodium: 134 mmol/L — ABNORMAL LOW (ref 135–145)
Total Bilirubin: 0.7 mg/dL (ref 0.0–1.2)
Total Protein: 6.6 g/dL (ref 6.5–8.1)

## 2024-03-18 LAB — MAGNESIUM: Magnesium: 2.1 mg/dL (ref 1.7–2.4)

## 2024-03-18 MED ORDER — HEPARIN SOD (PORK) LOCK FLUSH 100 UNIT/ML IV SOLN
500.0000 [IU] | Freq: Once | INTRAVENOUS | Status: AC
  Administered 2024-03-18: 500 [IU] via INTRAVENOUS
  Filled 2024-03-18: qty 5

## 2024-03-18 MED ORDER — MIRTAZAPINE 15 MG PO TABS: 15.0000 mg | ORAL_TABLET | Freq: Every day | ORAL | 0 refills | Status: DC

## 2024-03-18 MED ORDER — SODIUM CHLORIDE 0.9 % IV SOLN
Freq: Once | INTRAVENOUS | Status: AC
  Filled 2024-03-18: qty 250

## 2024-03-18 NOTE — Patient Instructions (Signed)
 [facility] - A DEPT OF [facility]  Discharge Instructions: Thank you for choosing [facility] to provide your oncology and hematology care.  If you have a lab appointment with the Cancer Center, please go directly to the Cancer Center and check in at the registration area.  Wear comfortable clothing and clothing appropriate for easy access to any Portacath or PICC line.   We strive to give you quality time with your provider. You may need to reschedule your appointment if you arrive late (15 or more minutes).  Arriving late affects you and other patients whose appointments are after yours.  Also, if you miss three or more appointments without notifying the office, you may be dismissed from the clinic at the provider's discretion.      For prescription refill requests, have your pharmacy contact our office and allow 72 hours for refills to be completed.    Today you received the following chemotherapy and/or immunotherapy agents ONE LITER NS      To help prevent nausea and vomiting after your treatment, we encourage you to take your nausea medication as directed.  BELOW ARE SYMPTOMS THAT SHOULD BE REPORTED IMMEDIATELY: *FEVER GREATER THAN 100.4 F (38 C) OR HIGHER *CHILLS OR SWEATING *NAUSEA AND VOMITING THAT IS NOT CONTROLLED WITH YOUR NAUSEA MEDICATION *UNUSUAL SHORTNESS OF BREATH *UNUSUAL BRUISING OR BLEEDING *URINARY PROBLEMS (pain or burning when urinating, or frequent urination) *BOWEL PROBLEMS (unusual diarrhea, constipation, pain near the anus) TENDERNESS IN MOUTH AND THROAT WITH OR WITHOUT PRESENCE OF ULCERS (sore throat, sores in mouth, or a toothache) UNUSUAL RASH, SWELLING OR PAIN  UNUSUAL VAGINAL DISCHARGE OR ITCHING   Items with * indicate a potential emergency and should be followed up as soon as possible or go to the Emergency Department if any problems should occur.  Please show the CHEMOTHERAPY ALERT CARD or IMMUNOTHERAPY  ALERT CARD at check-in to the Emergency Department and triage nurse.  Should you have questions after your visit or need to cancel or reschedule your appointment, please contact [facility] - A DEPT OF [facility]  [phone] and follow the prompts.  Office hours are 8:00 a.m. to 4:30 p.m. Monday - Friday. Please note that voicemails left after 4:00 p.m. may not be returned until the following business day.  We are closed weekends and major holidays. You have access to a nurse at all times for urgent questions. Please call the main number to the clinic [phone] and follow the prompts.  For any non-urgent questions, you may also contact your provider using MyChart. We now offer e-Visits for anyone 55 and older to request care online for non-urgent symptoms. For details visit [email or web address].   Also download the MyChart app! Go to the app store, search "MyChart", open the app, select [place], and log in with your MyChart username and password.

## 2024-03-18 NOTE — Progress Notes (Signed)
 [facility]  [phone]) [phone] [phone]) [phone]  ID: Veronica Boyd OB: [birth year]  MR#: [id number]  ION#:[id number]  Patient Care Team: Antonio Baumgarten, MD as PCP - General (Internal Medicine) Devorah Fonder, MD as PCP - Cardiology (Cardiology) Ardeen Kohler, MD as PCP - Electrophysiology (Cardiology) Shellie Dials, MD as Consulting Physician (Oncology) Rochell Chroman, RN as Oncology Nurse Navigator Marc Senior, MD as Consulting Physician (Pulmonary Disease)  CHIEF COMPLAINT: Recurrent stage IV rectal cancer, history of follicular lymphoma.  INTERVAL HISTORY: Patient returns to clinic today for repeat laboratory work, further evaluation, consideration of cycle 2 of FOLFIRI.  She continues to have a poor appetite and weight loss.  She has chronic weakness and fatigue. She does not complain of pain today.   She continues to have a chronic cough.  Her peripheral neuropathy is unchanged.  She has no other neurologic complaints. She denies any recent fevers or illnesses.  She denies any chest pain, shortness of breath, or hemoptysis.  She denies any nausea, vomiting, or constipation.  She has no urinary complaints.  Patient offers no further specific complaints today.  REVIEW OF SYSTEMS:   Review of Systems  Constitutional:  Positive for malaise/fatigue and weight loss. Negative for fever.  Respiratory:  Positive for cough. Negative for hemoptysis and shortness of breath.   Cardiovascular: Negative.  Negative for chest pain and leg swelling.  Gastrointestinal:  Positive for diarrhea. Negative for abdominal pain, blood in stool, melena, nausea and vomiting.  Genitourinary: Negative.  Negative for dysuria.  Musculoskeletal: Negative.  Negative for back pain, joint pain and neck pain.  Skin: Negative.  Negative for rash.  Neurological:  Positive for tingling, sensory change and weakness. Negative for focal weakness and headaches.   Psychiatric/Behavioral: Negative.  The patient is not nervous/anxious and does not have insomnia.     As per HPI. Otherwise, a complete review of systems is negative.   PAST MEDICAL HISTORY: Past Medical History:  Diagnosis Date   Anemia due to antineoplastic chemotherapy    Aortic atherosclerosis (HCC)    Arthritis    Atrial fibrillation and flutter (HCC)    a.) 04/2023 Zio: Monitor 1 - 16 % aflutter burden w/ avg rate of 168, (78-226), possible Atach. Freq PACs, rare PVCs. Monitor 2 - 15% afib/flutter burden @ 115 [phone] Poss Atach. 4 beats NSVT. Freq PACs, rare PVCs; b.)  CHA2DS2-VASc = 4 (age x2, sex, vascular disease history); c.) cardiac rate/rhythm maintained on oral metoprolol  succinate; chronically anticoagulated using apixaban    Bronchial obstruction    Chemotherapy-induced neuropathy (HCC)    Chicken pox    Colon polyp    Coronary artery calcification seen on CT scan    a. 11/2022 CT Chest: Coronary artery calcification and aortic atherosclerosis.   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 04/2023 Echo: EF >55%, nl RV size/fxn.  No significant valvular disease observed.   Hyperlipidemia    Mass of right chest wall    Multiple lung nodules 09/2022   On apixaban  therapy    Osteoporosis    Pectus excavatum    Protein-calorie malnutrition, severe (HCC)    Rectal carcinoma (HCC)    a.) recurrent stage IVA (cT4a, cN2a, cM1a)   Thrombocytopenia (HCC)     PAST SURGICAL HISTORY: Past Surgical History:  Procedure  Laterality Date   AXILLARY LYMPH NODE DISSECTION Right 08/21/2016   Procedure: AXILLARY LYMPH NODE excision;  Surgeon: Benancio Bracket, MD;  Location: [facility] ORS;  Service: General;  Laterality: Right;   CATARACT EXTRACTION, BILATERAL Bilateral    COLONOSCOPY N/A 09/19/2020   Procedure: COLONOSCOPY;  Surgeon:  Shane Darling, MD;  Location: [facility] ENDOSCOPY;  Service: Endoscopy;  Laterality: N/A;   ENDOBRONCHIAL ULTRASOUND Left 08/05/2023   Procedure: ENDOBRONCHIAL ULTRASOUND;  Surgeon: Marc Senior, MD;  Location: [facility] ORS;  Service: Pulmonary;  Laterality: Left;   PORTA CATH INSERTION N/A 09/16/2017   Procedure: PORTA CATH INSERTION;  Surgeon: Celso College, MD;  Location: [facility] INVASIVE CV LAB;  Service: Cardiovascular;  Laterality: N/A;   TONSILLECTOMY     TOTAL HIP ARTHROPLASTY Left 1992    FAMILY HISTORY: Family History  Problem Relation Age of Onset   Heart failure Mother    Emphysema Father    Diabetes Sister    Lung cancer Brother    Diabetes Brother    Breast cancer Paternal Aunt    Basal cell carcinoma Daughter     ADVANCED DIRECTIVES (Y/N):  N  HEALTH MAINTENANCE: Social History   Tobacco Use   Smoking status: Never   Smokeless tobacco: Never  Vaping Use   Vaping status: Never Used  Substance Use Topics   Alcohol use: No   Drug use: No     Colonoscopy:  PAP:  Bone density:  Lipid panel:  No Known Allergies  Current Outpatient Medications  Medication Sig Dispense Refill   amiodarone  (PACERONE ) 200 MG tablet Take 1 tablet (200 mg) by mouth twice daily for 2 weeks, then decrease to 1 tablet (200 mg) by mouth daily. 90 tablet 3   apixaban  (ELIQUIS ) 2.5 MG TABS tablet Take 1 tablet (2.5 mg total) by mouth 2 (two) times daily. 180 tablet 3   chlorpheniramine-HYDROcodone  (TUSSIONEX) 10-8 MG/5ML Take 5 mLs by mouth every 12 (twelve) hours as needed for cough. 115 mL 0   diclofenac sodium (VOLTAREN) 1 % GEL Apply 2 g topically 2 (two) times daily as needed.     diphenoxylate -atropine  (LOMOTIL ) 2.5-0.025 MG tablet TAKE 2 TABLETS BY MOUTH 4 (FOUR) TIMES DAILY AS NEEDED FOR DIARRHEA OR LOOSE STOOLS. 60 tablet 1   Fluticasone-Umeclidin-Vilant (TRELEGY ELLIPTA ) 100-62.5-25 MCG/ACT AEPB Inhale 1 Inhalation into the lungs daily in the afternoon. 2 each    gabapentin   (NEURONTIN ) 300 MG capsule TAKE 1 CAPSULE BY MOUTH TWICE A DAY 60 capsule 2   HYDROcodone -acetaminophen  (NORCO/VICODIN) 5-325 MG tablet Take 1 tablet by mouth every 6 (six) hours as needed for moderate pain (pain score 4-6). 30 tablet 0   levothyroxine  (SYNTHROID ) 25 MCG tablet TAKE 1 TABLET BY MOUTH EVERY DAY BEFORE BREAKFAST 30 tablet 11   lidocaine -prilocaine  (EMLA ) cream Apply 1 Application topically as needed. Apply to port 1 hour prior to use as needed 30 g 2   metoprolol  succinate (TOPROL -XL) 25 MG 24 hr tablet Take 25 mg by mouth daily.     pantoprazole  (PROTONIX ) 20 MG tablet Take 1 tablet by mouth daily as needed for heartburn.     potassium chloride  SA (KLOR-CON  M) 20 MEQ tablet TAKE 1 TABLET BY MOUTH TWICE A DAY 180 tablet 1   simvastatin  (ZOCOR ) 20 MG tablet Take 20 mg by mouth at bedtime.     traMADol-acetaminophen  (ULTRACET) 37.5-325 MG tablet Take 1 tablet by mouth every 8 (eight) hours as needed.     Fluticasone Furoate  (ARNUITY ELLIPTA ) 100  MCG/ACT AEPB Inhale 1 puff into the lungs daily. (Patient not taking: Reported on 03/18/2024) 30 each 6   mirtazapine (REMERON) 15 MG tablet Take 1 tablet (15 mg total) by mouth at bedtime. 30 tablet 0   No current facility-administered medications for this visit.   Facility-Administered Medications Ordered in Other Visits  Medication Dose Route Frequency Provider Last Rate Last Admin   heparin  lock flush 100 unit/mL  500 Units Intravenous Once Vladimir Lenhoff J, MD       heparin  lock flush 100 unit/mL  500 Units Intracatheter PRN Adrian Alba, Deadra Everts, MD       heparin  lock flush 100 unit/mL  500 Units Intravenous Once Mckensie Scotti J, MD       heparin  lock flush 100 unit/mL  500 Units Intravenous Once Jakyra Kenealy J, MD       sodium chloride  flush (NS) 0.9 % injection 10 mL  10 mL Intravenous PRN Aynsley Fleet J, MD   10 mL at 12/04/17 0901   sodium chloride  flush (NS) 0.9 % injection 10 mL  10 mL Intracatheter PRN Shellie Dials, MD       sodium chloride  flush (NS) 0.9 % injection 10 mL  10 mL Intravenous PRN Victorine Mcnee J, MD   10 mL at 04/24/21 0839   yttrium-90 injection 22.3 millicurie  22.3 millicurie Intravenous Once Glenis Langdon, MD        OBJECTIVE: Vitals:   03/18/24 0914 03/18/24 0926  BP: 124/73   Pulse: (!) 106 (!) 106  Resp: (!) 28   Temp: 97.6 F (36.4 C)   SpO2: 98%        Body mass index is 15.99 kg/m.    ECOG FS:2 - Symptomatic, <50% confined to bed  General: Thin, no acute distress.  Sitting in a wheelchair. Eyes: Pink conjunctiva, anicteric sclera. HEENT: Normocephalic, moist mucous membranes. Lungs: No audible wheezing or coughing. Heart: Regular rate and rhythm. Abdomen: Soft, nontender, no obvious distention. Musculoskeletal: No edema, cyanosis, or clubbing. Neuro: Alert, answering all questions appropriately. Cranial nerves grossly intact. Skin: No rashes or petechiae noted. Psych: Normal affect.  LAB RESULTS:  Lab Results  Component Value Date   NA 134 (L) 03/18/2024   K 3.6 03/18/2024   CL 97 (L) 03/18/2024   CO2 24 03/18/2024   GLUCOSE 154 (H) 03/18/2024   BUN 17 03/18/2024   CREATININE 0.86 03/18/2024   CALCIUM  8.5 (L) 03/18/2024   PROT 6.6 03/18/2024   ALBUMIN 3.2 (L) 03/18/2024   AST 21 03/18/2024   ALT 12 03/18/2024   ALKPHOS 70 03/18/2024   BILITOT 0.7 03/18/2024   GFRNONAA >60 03/18/2024   GFRAA >60 06/28/2020    Lab Results  Component Value Date   WBC 12.6 (H) 03/18/2024   NEUTROABS 9.1 (H) 03/18/2024   HGB 11.9 (L) 03/18/2024   HCT 37.8 03/18/2024   MCV 91.7 03/18/2024   PLT 366 03/18/2024     STUDIES: US  Venous Img Upper Uni Left (DVT) Result Date: 02/21/2024 CLINICAL DATA:  Left upper extremity pain and edema. EXAM: LEFT UPPER EXTREMITY VENOUS DOPPLER ULTRASOUND TECHNIQUE: Gray-scale sonography with graded compression, as well as color Doppler and duplex ultrasound were performed to evaluate the upper extremity deep venous  system from the level of the subclavian vein and including the jugular, axillary, basilic, radial, ulnar and upper cephalic vein. Spectral Doppler was utilized to evaluate flow at rest and with distal augmentation maneuvers. COMPARISON:  None Available. FINDINGS: Contralateral Subclavian Vein: Respiratory  phasicity is normal and symmetric with the symptomatic side. No evidence of thrombus. Normal compressibility. Internal Jugular Vein: No evidence of thrombus. Normal compressibility, respiratory phasicity and response to augmentation. Subclavian Vein: No evidence of thrombus. Normal compressibility, respiratory phasicity and response to augmentation. Axillary Vein: No evidence of thrombus. Normal compressibility, respiratory phasicity and response to augmentation. Cephalic Vein: No evidence of thrombus. Normal compressibility, respiratory phasicity and response to augmentation. Basilic Vein: No evidence of thrombus. Normal compressibility, respiratory phasicity and response to augmentation. Brachial Veins: No evidence of thrombus. Normal compressibility, respiratory phasicity and response to augmentation. Radial Veins: No evidence of thrombus. Normal compressibility, respiratory phasicity and response to augmentation. Ulnar Veins: No evidence of thrombus. Normal compressibility, respiratory phasicity and response to augmentation. Venous Reflux:  None visualized. Other Findings: No evidence of superficial thrombophlebitis or abnormal fluid collection. IMPRESSION: No evidence of DVT within the left upper extremity. Electronically Signed   By: Erica Hau M.D.   On: 02/21/2024 17:00     ONCOLOGY HISTORY:  Biopsy confirmed rectal cancer.  MRI completed at [facility] on October 11, 2020 revealed extension of the tumor beyond the wall into the rectovaginal recess with suspected invasion of the vaginal wall posteriorly.  Tumor also appears to involve the internal anal sphincter.  There are also numerous  prominent presacral and mesorectal lymph nodes highly suspicious for malignancy.  PET scan results from November 17, 2020 reviewed independently with 3 hypermetabolic right lower lobe lung nodules consistent with pulmonary metastasis.  Patient completed cycle 8 of FOLFOX on Mar 29, 2021.  She then completed cycle 5 of her 5-FU pump on May 26, 2021 and XRT on June 01, 2021.  PET scan results from July 17, 2021 reviewed independently with no evidence of disease other than enlarging right lower lobe lung lesion.  Patient completed SBRT to the right lung lesion on August 22, 2021.  Repeat PET scan on July 25, 2021 revealed minimal residual disease in the rectum and likely resolution of lesions in the lung.  PET scan results from Mar 21, 2023 reviewed independently with clear progression of disease in patient's pelvis as well as multiple pulmonary nodules.  Patient subsequently was placed on FOLFOX plus Avastin  again between April 10, 2023 and October 16, 2023.  CT scan results from November 11, 2023 with significant improvement of disease burden except for her lung masses which have increased in size.  Circular gene testing was positive for PD-L1 expression as well as ATM and CHEK2 mutations. Given the PD-L1 positivity, patient has agreed to pursue treatment with single agent Keytruda  which she received from November 27, 2023 through January 29, 2024.  Patient was once again noted to have progression of disease.  ASSESSMENT: Recurrent stage IV rectal cancer, history of follicular lymphoma.  PLAN:    Recurrent stage IV rectal cancer: See oncology history as above.  Keytruda  has been discontinued.  Hospice and comfort care have been discussed in the past, but patient is not ready to make this decision.  Patient has never received irinotecan  based therapy, so we will proceed with FOLFIRI. Will dose reduce treatment 25% from the onset.  Patient will also receive 1 L of IV fluids as well as Udenyca  with her pump  removal 2 days later.  This will be a 21-day cycle.  Given patient's poor appetite and weight loss, will delay cycle two 1 week.  She will receive IV fluids and also have appointment with dietary today.  Return to clinic in 1  week for further evaluation and reconsideration of cycle 2.  Will reimage after 4 cycles. Left lower lobe lung lesion: It is possible her lung lesions are second primary originating in lung, but previous biopsy from bronchoscopy did not reveal any evidence of malignancy.  Will treat as metastatic rectal cancer, although irinotecan  may have some effectiveness in lung malignancy. Recurrent follicular lymphoma: CT scan results from December 23, 2019 reviewed independently with no obvious evidence of recurrent or progressive disease.  PET scan results as above consistent with rectal cancer metastasis and no evidence of lymphoma.   Patient received Zevalin  on July 30, 2017 with not much therapeutic effect.  She subsequently underwent cycles 5 of R-CHOP chemotherapy with Neulasta  support completing on December 12, 2017.  Patient continues to be in complete remission.  Neuropathy: Chronic and unchanged. Pain: Patient does not complain of this today.  Continue tramadol and/or Norco as needed.   Poor appetite/weight loss: Worse despite Megace .  This has been discontinued and patient will start Remeron.  Appreciate dietary input. Coping/adjustment: Patient was given a second referral to [facility]. Atrial fibrillation: Appreciate cardiology input.  Patient now on amiodarone . Pelvic fracture: No interval needed at this time.  Continue symptomatic treatment with tramadol as above. Cough: Chronic and unchanged.  Continue Tussionex as prescribed. Elevated TSH: Improving.   Diarrhea: Patient will require atropine  as ordered with her treatments.  Continue Lomotil  as prescribed. Hypokalemia: Resolved.  Continue oral potassium supplementation. Thrombocytopenia: Resolved.   Patient expressed  understanding and was in agreement with this plan. She also understands that She can call clinic at any time with any questions, concerns, or complaints.    Cancer Staging  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 [facility]) Staging form: Lymphoid Neoplasms, AJCC 6th Edition - Clinical stage from 08/27/2016: Stage IIE - Signed by Shellie Dials, MD on 08/27/2016  Rectal cancer [facility]) Staging form: Colon and Rectum, AJCC 8th Edition - Clinical: Stage IVA [name], [place].[facility]) - Signed by Shellie Dials, MD on 11/02/2020   Shellie Dials, MD   03/18/2024 1:03 PM

## 2024-03-18 NOTE — Progress Notes (Signed)
 Nutrition Follow-up:   Add on to schedule today  Patient with recurrent stage IV rectal cancer.  Patient switched to folfiri following progression of disease  Met with patient during infusion and daughter.  Patient reports no appetite.  MD adding mirtazapine today.  Son brings her breakfast each morning but lately has been telling him not to bring it.  Daughter prepares meals as well    Medications: reviewed  Labs: reviewed  Anthropometrics:   Weight 76 lb today 88 lb 01/29/24 87 lb on 07/31/23 (last seen by RD) 86 lb on 7/17  13% weight loss in the last 2 months, significant   NUTRITION DIAGNOSIS: Inadequate oral intake continues    INTERVENTION:  Discussed boost VHC (530 calorie shake) sample given and magic cup  Encouraged small frequent mini meals/snacks    MONITORING, EVALUATION, GOAL: weight trends, intake   NEXT VISIT: Wednesday, June 11 during infusion  Veronica Boyd, CSO, LDN Registered Dietitian [phone]

## 2024-03-19 ENCOUNTER — Other Ambulatory Visit: Payer: Self-pay

## 2024-03-20 ENCOUNTER — Telehealth: Payer: Self-pay | Admitting: *Deleted

## 2024-03-20 ENCOUNTER — Inpatient Hospital Stay

## 2024-03-20 NOTE — Telephone Encounter (Signed)
 Daughter called about the FMLA and there was not a date beside of the MD signing care provider signature.  The next thing that needs to be cleared up is on intermittent leave under the incapacity be it says 3 episodes every 7 days and how much each time is 2 days, they just want to have that initialed and dated about the above 2-day part. Everything has gotten straight about the 2 things and we are faxing back the FMLA to [employer] with the issues on the 2 things above .

## 2024-03-23 ENCOUNTER — Telehealth: Payer: Self-pay | Admitting: *Deleted

## 2024-03-23 ENCOUNTER — Inpatient Hospital Stay

## 2024-03-23 ENCOUNTER — Inpatient Hospital Stay: Admitting: Hospice and Palliative Medicine

## 2024-03-23 ENCOUNTER — Encounter: Payer: Self-pay | Admitting: Hospice and Palliative Medicine

## 2024-03-23 ENCOUNTER — Encounter: Payer: Self-pay | Admitting: Oncology

## 2024-03-23 ENCOUNTER — Ambulatory Visit

## 2024-03-23 VITALS — BP 109/58 | HR 101 | Resp 18 | Wt 79.9 lb

## 2024-03-23 DIAGNOSIS — C20 Malignant neoplasm of rectum: Secondary | ICD-10-CM

## 2024-03-23 DIAGNOSIS — R531 Weakness: Secondary | ICD-10-CM

## 2024-03-23 DIAGNOSIS — E860 Dehydration: Secondary | ICD-10-CM

## 2024-03-23 DIAGNOSIS — Z95828 Presence of other vascular implants and grafts: Secondary | ICD-10-CM

## 2024-03-23 DIAGNOSIS — Z5111 Encounter for antineoplastic chemotherapy: Secondary | ICD-10-CM | POA: Diagnosis not present

## 2024-03-23 LAB — COMPREHENSIVE METABOLIC PANEL WITH GFR
ALT: 12 U/L (ref 0–44)
AST: 18 U/L (ref 15–41)
Albumin: 3 g/dL — ABNORMAL LOW (ref 3.5–5.0)
Alkaline Phosphatase: 66 U/L (ref 38–126)
Anion gap: 9 (ref 5–15)
BUN: 17 mg/dL (ref 8–23)
CO2: 25 mmol/L (ref 22–32)
Calcium: 8.4 mg/dL — ABNORMAL LOW (ref 8.9–10.3)
Chloride: 101 mmol/L (ref 98–111)
Creatinine, Ser: 0.93 mg/dL (ref 0.44–1.00)
GFR, Estimated: >60 mL/min (ref >60)
Glucose, Bld: 106 mg/dL — ABNORMAL HIGH (ref 70–99)
Potassium: 3.5 mmol/L (ref 3.5–5.1)
Sodium: 135 mmol/L (ref 135–145)
Total Bilirubin: 0.7 mg/dL (ref 0.0–1.2)
Total Protein: 6.3 g/dL — ABNORMAL LOW (ref 6.5–8.1)

## 2024-03-23 LAB — CBC WITH DIFFERENTIAL/PLATELET
Abs Immature Granulocytes: 0.09 10*3/uL — ABNORMAL HIGH (ref 0.00–0.07)
Basophils Absolute: 0.1 10*3/uL (ref 0.0–0.1)
Basophils Relative: 1 %
Eosinophils Absolute: 0.1 10*3/uL (ref 0.0–0.5)
Eosinophils Relative: 1 %
HCT: 37.7 % (ref 36.0–46.0)
Hemoglobin: 11.8 g/dL — ABNORMAL LOW (ref 12.0–15.0)
Immature Granulocytes: 1 %
Lymphocytes Relative: 14 %
Lymphs Abs: 1.6 10*3/uL (ref 0.7–4.0)
MCH: 28.9 pg (ref 26.0–34.0)
MCHC: 31.3 g/dL (ref 30.0–36.0)
MCV: 92.2 fL (ref 80.0–100.0)
Monocytes Absolute: 1.6 10*3/uL — ABNORMAL HIGH (ref 0.1–1.0)
Monocytes Relative: 14 %
Neutro Abs: 8.4 10*3/uL — ABNORMAL HIGH (ref 1.7–7.7)
Neutrophils Relative %: 69 %
Platelets: 371 10*3/uL (ref 150–400)
RBC: 4.09 MIL/uL (ref 3.87–5.11)
RDW: 16.6 % — ABNORMAL HIGH (ref 11.5–15.5)
WBC: 11.8 10*3/uL — ABNORMAL HIGH (ref 4.0–10.5)
nRBC: 0 % (ref 0.0–0.2)

## 2024-03-23 LAB — MAGNESIUM: Magnesium: 1.9 mg/dL (ref 1.7–2.4)

## 2024-03-23 MED ORDER — SODIUM CHLORIDE 0.9 % IV SOLN
INTRAVENOUS | Status: DC
  Filled 2024-03-23: qty 250

## 2024-03-23 MED ORDER — SODIUM CHLORIDE 0.9 % IV SOLN
Freq: Once | INTRAVENOUS | Status: AC
  Filled 2024-03-23: qty 250

## 2024-03-23 MED ORDER — SODIUM CHLORIDE 0.9% FLUSH
10.0000 mL | Freq: Once | INTRAVENOUS | Status: AC
  Administered 2024-03-23: 10 mL via INTRAVENOUS
  Filled 2024-03-23: qty 10

## 2024-03-23 MED ORDER — SODIUM CHLORIDE 0.9% FLUSH
10.0000 mL | Freq: Once | INTRAVENOUS | Status: DC
  Filled 2024-03-23: qty 10

## 2024-03-23 MED ORDER — HEPARIN SOD (PORK) LOCK FLUSH 100 UNIT/ML IV SOLN
500.0000 [IU] | Freq: Once | INTRAVENOUS | Status: AC | PRN
  Administered 2024-03-23: 500 [IU]
  Filled 2024-03-23: qty 5

## 2024-03-23 MED ORDER — DEXAMETHASONE SODIUM PHOSPHATE 10 MG/ML IJ SOLN
10.0000 mg | Freq: Once | INTRAMUSCULAR | Status: AC
  Administered 2024-03-23: 10 mg via INTRAVENOUS
  Filled 2024-03-23: qty 1

## 2024-03-23 MED ORDER — SODIUM CHLORIDE 0.9% FLUSH
3.0000 mL | Freq: Once | INTRAVENOUS | Status: DC | PRN
Start: 2024-03-23 — End: 2024-03-23
  Filled 2024-03-23: qty 3

## 2024-03-23 NOTE — Progress Notes (Signed)
 Port access documented on infusion encounter.

## 2024-03-23 NOTE — Progress Notes (Signed)
 Patient daughter states patient is not drinking enough fluids. Patient daughter states patient has no appetite, and no energy. Patient was dizzy this morning while getting up. Patient states yesterday it was noticed her hair is starting to fall out.

## 2024-03-23 NOTE — Telephone Encounter (Signed)
 Daughter called and says she is bar barely drinking like a little bird and she is not remembering things and her hair is falling out and she is weak.  We called the daughter and said to be here within 45 minutes so she should be here about 10:15.  The patient does not have any diarrhea

## 2024-03-23 NOTE — Progress Notes (Signed)
 Symptom Management Clinic [facility] at [facility] Telephone:([phone] [phone]  Patient Care Team: Antonio Baumgarten, MD as PCP - General (Internal Medicine) Devorah Fonder, MD as PCP - Cardiology (Cardiology) Ardeen Kohler, MD as PCP - Electrophysiology (Cardiology) Shellie Dials, MD as Consulting Physician (Oncology) Rochell Chroman, RN as Oncology Nurse Navigator Marc Senior, MD as Consulting Physician (Pulmonary Disease)   NAME OF PATIENT: Veronica Boyd  [id number]  Aug 01, 1946   DATE OF VISIT: 03/23/24  REASON FOR CONSULT: CHARNESE Boyd is a 78 y.o. female with multiple medical problems including stage IV rectal cancer, history of follicular lymphoma.  Patient is status post chemotherapy and RT.  Plan was for surgical evaluation but patient was found to have disease progression with pulmonary nodules.  Hospice was discussed but patient ultimately opted to pursue further treatment. .   INTERVAL HISTORY: Patient saw Dr. Adrian Alba on 03/18/2024 for consideration of cycle 2 FOLFIRI.  However, patient had decline in oral intake and weight loss after cycle 1.  Decision was made to delay cycle 2 x 1 week.  Patient received IV fluids and supportive care.  Patient presents to [facility] today with complaint of ongoing fatigue and poor oral intake.  Patient states that she is eating and drinking very little.  Was started on mirtazapine  but has not had any improvement.  Denies any neurologic complaints. Denies recent fevers or illnesses. Denies any easy bleeding or bruising. Denies chest pain. Denies any nausea, vomiting, constipation, or diarrhea. Denies urinary complaints. Patient offers no further specific complaints today.   PAST MEDICAL HISTORY: Past Medical History:  Diagnosis Date   Anemia due to antineoplastic chemotherapy    Aortic atherosclerosis (HCC)    Arthritis    Atrial fibrillation and flutter (HCC)    a.) 04/2023 Zio:  Monitor 1 - 16 % aflutter burden w/ avg rate of 168, (78-226), possible Atach. Freq PACs, rare PVCs. Monitor 2 - 15% afib/flutter burden @ 115 [phone] Poss Atach. 4 beats NSVT. Freq PACs, rare PVCs; b.)  CHA2DS2-VASc = 4 (age x2, sex, vascular disease history); c.) cardiac rate/rhythm maintained on oral metoprolol  succinate; chronically anticoagulated using apixaban    Bronchial obstruction    Chemotherapy-induced neuropathy (HCC)    Chicken pox    Colon polyp    Coronary artery calcification seen on CT scan    a. 11/2022 CT Chest: Coronary artery calcification and aortic atherosclerosis.   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 04/2023 Echo: EF >55%, nl RV size/fxn.  No significant valvular disease observed.   Hyperlipidemia    Mass of right chest wall    Multiple lung nodules 09/2022   On apixaban  therapy    Osteoporosis    Pectus excavatum    Protein-calorie malnutrition, severe (HCC)    Rectal carcinoma (HCC)    a.) recurrent stage IVA (cT4a, cN2a, cM1a)   Thrombocytopenia (HCC)     PAST SURGICAL HISTORY:  Past Surgical History:  Procedure Laterality Date   AXILLARY LYMPH NODE DISSECTION Right 08/21/2016   Procedure: AXILLARY LYMPH NODE excision;  Surgeon: Benancio Bracket, MD;  Location: [facility] ORS;  Service: General;  Laterality: Right;   CATARACT EXTRACTION, BILATERAL Bilateral    COLONOSCOPY N/A 09/19/2020   Procedure: COLONOSCOPY;  Surgeon: Shane Darling, MD;  Location:  [facility] ENDOSCOPY;  Service: Endoscopy;  Laterality: N/A;   ENDOBRONCHIAL ULTRASOUND Left 08/05/2023   Procedure: ENDOBRONCHIAL ULTRASOUND;  Surgeon: Marc Senior, MD;  Location: [facility] ORS;  Service: Pulmonary;  Laterality: Left;   PORTA CATH INSERTION N/A 09/16/2017   Procedure: PORTA CATH INSERTION;  Surgeon: Celso College, MD;  Location: [facility]  INVASIVE CV LAB;  Service: Cardiovascular;  Laterality: N/A;   TONSILLECTOMY     TOTAL HIP ARTHROPLASTY Left 1992    HEMATOLOGY/ONCOLOGY HISTORY:  Oncology History Overview Note  Initial diagnosis 2014. S/p radiation 3 years ago.  Noted increase lymphadenopathy and biopsy was recommended.  Unfortunately she refused and was lost to follow-up. Return to clinic in September 2017 with increased swelling surrounding her right collarbone.  Had biopsy that was suggestive of follicular lymphoma but not definitive.  Had PET scan to complete the staging work-up.  PET scan showed hypermetabolic adenopathy in bilateral subpectoral and axillary regions, right internal mammary chain and right paratracheal and subclavicular regions.  There is no evidence of hypermetabolic lymphadenopathy within the abdomen or pelvis.  Received Zevalin  on July 30, 2017 with not much therapeutic effect.  She subsequently underwent cycles 5 of R CHOP chemotherapy with Neulasta  support completing on December 12, 2017.  No further intervention or treatments are needed at this time.  PET scan results from February 19, 2018 reviewed independently and reported as above with no suspicious finding for active lymphoma.      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)  07/24/2016 Initial Diagnosis   Follicular lymphoma (HCC)   Rectal cancer (HCC)  11/02/2020 Initial Diagnosis   Rectal cancer (HCC)   11/02/2020 Cancer Staging   Staging form: Colon and Rectum, AJCC 8th Edition - Clinical: Stage IVA [name], cM1a) - Signed by Shellie Dials, MD on 11/02/2020   11/16/2020 - 03/31/2021 Chemotherapy         04/24/2021 - 05/26/2021 Chemotherapy   Patient is on Treatment Plan : RECTAL 5FU IVCI D1-5 + XRT     04/10/2023 - 10/18/2023 Chemotherapy   Patient is on Treatment Plan : COLORECTAL FOLFOX + Bevacizumab  q14d     02/26/2024 -  Chemotherapy   Patient is on Treatment Plan : COLORECTAL FOLFIRI q21d        ALLERGIES:  has no known allergies.  MEDICATIONS:  Current Outpatient Medications  Medication Sig Dispense Refill   amiodarone  (PACERONE ) 200 MG tablet Take 1 tablet (200 mg) by mouth twice daily for 2 weeks, then decrease to 1 tablet (200 mg) by mouth daily. 90 tablet 3   apixaban  (ELIQUIS ) 2.5 MG TABS tablet Take 1 tablet (2.5 mg total) by mouth 2 (two) times daily. 180 tablet 3   chlorpheniramine-HYDROcodone  (TUSSIONEX) 10-8 MG/5ML Take 5 mLs by mouth every 12 (twelve) hours as needed for cough. 115 mL 0   diclofenac sodium (VOLTAREN) 1 % GEL Apply 2 g topically 2 (two) times daily as needed.     diphenoxylate -atropine  (LOMOTIL ) 2.5-0.025 MG tablet TAKE 2 TABLETS BY MOUTH 4 (FOUR) TIMES DAILY AS NEEDED FOR DIARRHEA OR LOOSE STOOLS. 60 tablet 1   Fluticasone Furoate  (ARNUITY ELLIPTA ) 100 MCG/ACT AEPB Inhale 1 puff into the lungs daily. (Patient not taking: Reported on 03/18/2024) 30 each 6   Fluticasone-Umeclidin-Vilant (TRELEGY ELLIPTA ) 100-62.5-25 MCG/ACT AEPB Inhale 1 Inhalation into the lungs daily in the afternoon. 2 each    gabapentin  (NEURONTIN ) 300 MG capsule TAKE 1 CAPSULE BY MOUTH TWICE A DAY 60 capsule 2  HYDROcodone -acetaminophen  (NORCO/VICODIN) 5-325 MG tablet Take 1 tablet by mouth every 6 (six) hours as needed for moderate pain (pain score 4-6). 30 tablet 0   levothyroxine  (SYNTHROID ) 25 MCG tablet TAKE 1 TABLET BY MOUTH EVERY DAY BEFORE BREAKFAST 30 tablet 11   lidocaine -prilocaine  (EMLA ) cream Apply 1 Application topically as needed. Apply to port 1 hour prior to use as needed 30 g 2   metoprolol  succinate (TOPROL -XL) 25 MG 24 hr tablet Take 25 mg by mouth daily.     mirtazapine  (REMERON ) 15 MG tablet Take 1 tablet (15 mg total) by mouth at bedtime. 30 tablet 0   pantoprazole  (PROTONIX ) 20 MG tablet Take 1 tablet by mouth daily as needed for heartburn.     potassium chloride  SA (KLOR-CON  M) 20 MEQ tablet TAKE 1 TABLET BY MOUTH TWICE A DAY 180 tablet 1   simvastatin   (ZOCOR ) 20 MG tablet Take 20 mg by mouth at bedtime.     traMADol-acetaminophen  (ULTRACET) 37.5-325 MG tablet Take 1 tablet by mouth every 8 (eight) hours as needed.     No current facility-administered medications for this visit.   Facility-Administered Medications Ordered in Other Visits  Medication Dose Route Frequency Provider Last Rate Last Admin   heparin  lock flush 100 unit/mL  500 Units Intravenous Once Finnegan, Timothy J, MD       heparin  lock flush 100 unit/mL  500 Units Intracatheter PRN Adrian Alba, Deadra Everts, MD       heparin  lock flush 100 unit/mL  500 Units Intravenous Once Finnegan, Timothy J, MD       heparin  lock flush 100 unit/mL  500 Units Intravenous Once Finnegan, Timothy J, MD       sodium chloride  flush (NS) 0.9 % injection 10 mL  10 mL Intravenous PRN Finnegan, Timothy J, MD   10 mL at 12/04/17 0901   sodium chloride  flush (NS) 0.9 % injection 10 mL  10 mL Intracatheter PRN Shellie Dials, MD       sodium chloride  flush (NS) 0.9 % injection 10 mL  10 mL Intravenous PRN Finnegan, Timothy J, MD   10 mL at 04/24/21 0839   sodium chloride  flush (NS) 0.9 % injection 10 mL  10 mL Intravenous Once Shaquinta Peruski R, NP       yttrium-90 injection 22.3 millicurie  22.3 millicurie Intravenous Once Chrystal, Allison Ivory, MD        VITAL SIGNS: There were no vitals taken for this visit. There were no vitals filed for this visit.  Estimated body mass index is 15.99 kg/m as calculated from the following:   Height as of 03/18/24: 4\' 10"  (1.473 m).   Weight as of 03/18/24: 76 lb 8 oz (34.7 kg).  LABS: CBC:    Component Value Date/Time   WBC 12.6 (H) 03/18/2024 0853   WBC 5.2 03/04/2024 0904   HGB 11.9 (L) 03/18/2024 0853   HGB 13.7 09/28/2013 1538   HCT 37.8 03/18/2024 0853   HCT 41.0 09/28/2013 1538   PLT 366 03/18/2024 0853   PLT 229 09/28/2013 1538   MCV 91.7 03/18/2024 0853   MCV 93 09/28/2013 1538   NEUTROABS 9.1 (H) 03/18/2024 0853   NEUTROABS 5.3 09/28/2013 1538    LYMPHSABS 1.6 03/18/2024 0853   LYMPHSABS 1.8 09/28/2013 1538   MONOABS 1.6 (H) 03/18/2024 0853   MONOABS 0.9 09/28/2013 1538   EOSABS 0.1 03/18/2024 0853   EOSABS 0.1 09/28/2013 1538   BASOSABS 0.1 03/18/2024 0853   BASOSABS 0.0 09/28/2013 1538  Comprehensive Metabolic Panel:    Component Value Date/Time   NA 134 (L) 03/18/2024 0859   K 3.6 03/18/2024 0859   CL 97 (L) 03/18/2024 0859   CO2 24 03/18/2024 0859   BUN 17 03/18/2024 0859   CREATININE 0.86 03/18/2024 0859   GLUCOSE 154 (H) 03/18/2024 0859   CALCIUM  8.5 (L) 03/18/2024 0859   AST 21 03/18/2024 0859   ALT 12 03/18/2024 0859   ALKPHOS 70 03/18/2024 0859   BILITOT 0.7 03/18/2024 0859   PROT 6.6 03/18/2024 0859   ALBUMIN 3.2 (L) 03/18/2024 0859    RADIOGRAPHIC STUDIES: No results found.  PERFORMANCE STATUS (ECOG) : 3 - Symptomatic, >50% confined to bed  Review of Systems Unless otherwise noted, a complete review of systems is negative.  Physical Exam General: NAD Cardiovascular: regular rate and rhythm Pulmonary: clear ant fields Abdomen: soft, nontender, + bowel sounds GU: no suprapubic tenderness Extremities: no edema, no joint deformities Skin: no rashes Neurological: Weakness but otherwise nonfocal  IMPRESSION/PLAN:   Stage IV rectal cancer -on FOLFIRI chemotherapy but has had delay in treatment due to symptom burden.  Weakness/poor oral intake -likely in part exacerbated by chemotherapy.  Will proceed with supportive care and IV fluids today.  Discussed overall goals including option of foregoing further chemotherapy and transitioning to hospice.  Patient undecided.  She has follow-up with Dr. Adrian Alba in 2 days and plans to discuss it further with him then.  Case and plan discussed with Dr. Adrian Alba  Patient expressed understanding and was in agreement with this plan. She also understands that She can call clinic at any time with any questions, concerns, or complaints.   Thank you for allowing me  to participate in the care of this very pleasant patient.   Time Total: 20 minutes  Visit consisted of counseling and education dealing with the complex and emotionally intense issues of symptom management in the setting of serious illness.Greater than 50%  of this time was spent counseling and coordinating care related to the above assessment and plan.  Signed by: Gerilyn Kobus, PhD, NP-C

## 2024-03-23 NOTE — Telephone Encounter (Signed)
 The daughter called saying that need to have on the FMLA that she can be out 2 days on part C3B and we did that and faxed it back. The date for the MD was not done and that was fixed and I asked that to [employer] it which is the daughter's FMLA .  I spoke to [facility] and let her know that I was putting it in to the fax on Friday evening.  The changes was done and the FMLA has been fixed and went to [employer] with transmission went through

## 2024-03-23 NOTE — Progress Notes (Signed)
 V/o ok to change rate to 999 ml/hour for Normal Saline given current lab values.

## 2024-03-25 ENCOUNTER — Inpatient Hospital Stay

## 2024-03-25 ENCOUNTER — Encounter: Payer: Self-pay | Admitting: Oncology

## 2024-03-25 ENCOUNTER — Other Ambulatory Visit: Payer: Self-pay | Admitting: Oncology

## 2024-03-25 ENCOUNTER — Inpatient Hospital Stay (HOSPITAL_BASED_OUTPATIENT_CLINIC_OR_DEPARTMENT_OTHER): Admitting: Oncology

## 2024-03-25 ENCOUNTER — Encounter

## 2024-03-25 VITALS — BP 106/63 | HR 79 | Temp 98.9°F | Resp 17

## 2024-03-25 VITALS — BP 122/55 | HR 92 | Temp 98.0°F | Resp 16 | Ht <= 58 in | Wt 79.0 lb

## 2024-03-25 DIAGNOSIS — Z5111 Encounter for antineoplastic chemotherapy: Secondary | ICD-10-CM | POA: Diagnosis not present

## 2024-03-25 DIAGNOSIS — C20 Malignant neoplasm of rectum: Secondary | ICD-10-CM

## 2024-03-25 LAB — CBC WITH DIFFERENTIAL (CANCER CENTER ONLY)
Abs Immature Granulocytes: 0.09 10*3/uL — ABNORMAL HIGH (ref 0.00–0.07)
Basophils Absolute: 0 10*3/uL (ref 0.0–0.1)
Basophils Relative: 0 %
Eosinophils Absolute: 0 10*3/uL (ref 0.0–0.5)
Eosinophils Relative: 0 %
HCT: 34.2 % — ABNORMAL LOW (ref 36.0–46.0)
Hemoglobin: 10.7 g/dL — ABNORMAL LOW (ref 12.0–15.0)
Immature Granulocytes: 1 %
Lymphocytes Relative: 16 %
Lymphs Abs: 2.1 10*3/uL (ref 0.7–4.0)
MCH: 29.2 pg (ref 26.0–34.0)
MCHC: 31.3 g/dL (ref 30.0–36.0)
MCV: 93.2 fL (ref 80.0–100.0)
Monocytes Absolute: 1.3 10*3/uL — ABNORMAL HIGH (ref 0.1–1.0)
Monocytes Relative: 10 %
Neutro Abs: 9.7 10*3/uL — ABNORMAL HIGH (ref 1.7–7.7)
Neutrophils Relative %: 73 %
Platelet Count: 344 10*3/uL (ref 150–400)
RBC: 3.67 MIL/uL — ABNORMAL LOW (ref 3.87–5.11)
RDW: 16.7 % — ABNORMAL HIGH (ref 11.5–15.5)
WBC Count: 13.3 10*3/uL — ABNORMAL HIGH (ref 4.0–10.5)
nRBC: 0 % (ref 0.0–0.2)

## 2024-03-25 LAB — CMP (CANCER CENTER ONLY)
ALT: 17 U/L (ref 0–44)
AST: 22 U/L (ref 15–41)
Albumin: 2.9 g/dL — ABNORMAL LOW (ref 3.5–5.0)
Alkaline Phosphatase: 60 U/L (ref 38–126)
Anion gap: 8 (ref 5–15)
BUN: 22 mg/dL (ref 8–23)
CO2: 25 mmol/L (ref 22–32)
Calcium: 8.3 mg/dL — ABNORMAL LOW (ref 8.9–10.3)
Chloride: 104 mmol/L (ref 98–111)
Creatinine: 0.71 mg/dL (ref 0.44–1.00)
GFR, Estimated: >60 mL/min (ref >60)
Glucose, Bld: 130 mg/dL — ABNORMAL HIGH (ref 70–99)
Potassium: 3.5 mmol/L (ref 3.5–5.1)
Sodium: 137 mmol/L (ref 135–145)
Total Bilirubin: 0.4 mg/dL (ref 0.0–1.2)
Total Protein: 6.2 g/dL — ABNORMAL LOW (ref 6.5–8.1)

## 2024-03-25 LAB — MAGNESIUM: Magnesium: 1.9 mg/dL (ref 1.7–2.4)

## 2024-03-25 MED ORDER — SODIUM CHLORIDE 0.9 % IV SOLN
1800.0000 mg/m2 | INTRAVENOUS | Status: DC
  Administered 2024-03-25: 2000 mg via INTRAVENOUS
  Filled 2024-03-25: qty 40

## 2024-03-25 MED ORDER — ATROPINE SULFATE 1 MG/ML IV SOLN
0.5000 mg | Freq: Once | INTRAVENOUS | Status: AC | PRN
  Administered 2024-03-25: 0.5 mg via INTRAVENOUS
  Filled 2024-03-25: qty 1

## 2024-03-25 MED ORDER — DEXAMETHASONE SODIUM PHOSPHATE 10 MG/ML IJ SOLN
10.0000 mg | Freq: Once | INTRAMUSCULAR | Status: AC
Start: 2024-03-25 — End: 2024-03-25
  Administered 2024-03-25: 10 mg via INTRAVENOUS
  Filled 2024-03-25: qty 1

## 2024-03-25 MED ORDER — SODIUM CHLORIDE 0.9 % IV SOLN: 1800.0000 mg/m2 | INTRAVENOUS | Status: DC

## 2024-03-25 MED ORDER — FLUOROURACIL CHEMO INJECTION 500 MG/10ML
300.0000 mg/m2 | Freq: Once | INTRAVENOUS | Status: AC
  Administered 2024-03-25: 400 mg via INTRAVENOUS
  Filled 2024-03-25: qty 8

## 2024-03-25 MED ORDER — IRINOTECAN HCL CHEMO INJECTION 100 MG/5ML
135.0000 mg/m2 | Freq: Once | INTRAVENOUS | Status: AC
  Administered 2024-03-25: 160 mg via INTRAVENOUS
  Filled 2024-03-25: qty 8

## 2024-03-25 MED ORDER — SODIUM CHLORIDE 0.9 % IV SOLN
INTRAVENOUS | Status: DC
  Filled 2024-03-25: qty 250

## 2024-03-25 MED ORDER — SODIUM CHLORIDE 0.9 % IV SOLN
300.0000 mg/m2 | Freq: Once | INTRAVENOUS | Status: AC
  Administered 2024-03-25: 376 mg via INTRAVENOUS
  Filled 2024-03-25: qty 18.8

## 2024-03-25 MED ORDER — PALONOSETRON HCL INJECTION 0.25 MG/5ML
0.2500 mg | Freq: Once | INTRAVENOUS | Status: AC
  Administered 2024-03-25: 0.25 mg via INTRAVENOUS
  Filled 2024-03-25: qty 5

## 2024-03-25 NOTE — Patient Instructions (Signed)
 [facility] - A DEPT OF [facility]  Discharge Instructions: Thank you for choosing [facility] to provide your oncology and hematology care.  If you have a lab appointment with the Cancer Center, please go directly to the Cancer Center and check in at the registration area.  Wear comfortable clothing and clothing appropriate for easy access to any Portacath or PICC line.   We strive to give you quality time with your provider. You may need to reschedule your appointment if you arrive late (15 or more minutes).  Arriving late affects you and other patients whose appointments are after yours.  Also, if you miss three or more appointments without notifying the office, you may be dismissed from the clinic at the provider's discretion.      For prescription refill requests, have your pharmacy contact our office and allow 72 hours for refills to be completed.    Today you received the following chemotherapy and/or immunotherapy agents irinotecan, leucovorin, and adrucil      To help prevent nausea and vomiting after your treatment, we encourage you to take your nausea medication as directed.  BELOW ARE SYMPTOMS THAT SHOULD BE REPORTED IMMEDIATELY: *FEVER GREATER THAN 100.4 F (38 C) OR HIGHER *CHILLS OR SWEATING *NAUSEA AND VOMITING THAT IS NOT CONTROLLED WITH YOUR NAUSEA MEDICATION *UNUSUAL SHORTNESS OF BREATH *UNUSUAL BRUISING OR BLEEDING *URINARY PROBLEMS (pain or burning when urinating, or frequent urination) *BOWEL PROBLEMS (unusual diarrhea, constipation, pain near the anus) TENDERNESS IN MOUTH AND THROAT WITH OR WITHOUT PRESENCE OF ULCERS (sore throat, sores in mouth, or a toothache) UNUSUAL RASH, SWELLING OR PAIN  UNUSUAL VAGINAL DISCHARGE OR ITCHING   Items with * indicate a potential emergency and should be followed up as soon as possible or go to the Emergency Department if any problems should occur.  Please show the CHEMOTHERAPY  ALERT CARD or IMMUNOTHERAPY ALERT CARD at check-in to the Emergency Department and triage nurse.  Should you have questions after your visit or need to cancel or reschedule your appointment, please contact [facility] - A DEPT OF [facility]  [phone] and follow the prompts.  Office hours are 8:00 a.m. to 4:30 p.m. Monday - Friday. Please note that voicemails left after 4:00 p.m. may not be returned until the following business day.  We are closed weekends and major holidays. You have access to a nurse at all times for urgent questions. Please call the main number to the clinic [phone] and follow the prompts.  For any non-urgent questions, you may also contact your provider using MyChart. We now offer e-Visits for anyone 32 and older to request care online for non-urgent symptoms. For details visit [email or web address].   Also download the MyChart app! Go to the app store, search "MyChart", open the app, select [place], and log in with your MyChart username and password.

## 2024-03-25 NOTE — Progress Notes (Signed)
 Changed 5-fu dose in treatment plan to reflect the decrease in dose and current wt.  New dose will be 2000mg  for the pump and 375 mg for the iv push

## 2024-03-25 NOTE — Progress Notes (Signed)
 Dose of 5-Fu decreased to 2000mg  based on patient weight decrease and BSA of 1.21

## 2024-03-25 NOTE — Progress Notes (Signed)
 [facility]  Telephone:([phone]) [phone] Fax:([phone]) [phone]  ID: Veronica Boyd OB: December 14, 1945  MR#: [id number]  AOZ#:[id number]  Patient Care Team: Antonio Baumgarten, MD as PCP - General (Internal Medicine) Devorah Fonder, MD as PCP - Cardiology (Cardiology) Ardeen Kohler, MD as PCP - Electrophysiology (Cardiology) Shellie Dials, MD as Consulting Physician (Oncology) Rochell Chroman, RN as Oncology Nurse Navigator Marc Senior, MD as Consulting Physician (Pulmonary Disease)  CHIEF COMPLAINT: Recurrent stage IV rectal cancer, history of follicular lymphoma.  INTERVAL HISTORY: Patient returns to clinic today for further evaluation and reconsideration of cycle 2 of FOLFIRI.  She continues to have weakness and fatigue, but this has improved.  Her appetite has improved and she has gained weight in the interim.  She does not complain of pain today. Her peripheral neuropathy is unchanged.  She has no other neurologic complaints. She denies any recent fevers or illnesses.  She denies any chest pain, shortness of breath, or hemoptysis.  She denies any nausea, vomiting, constipation, or diarrhea.  She has no urinary complaints.  Patient offers no further specific complaints today.  REVIEW OF SYSTEMS:   Review of Systems  Constitutional:  Positive for malaise/fatigue and weight loss. Negative for fever.  Respiratory:  Positive for cough. Negative for hemoptysis and shortness of breath.   Cardiovascular: Negative.  Negative for chest pain and leg swelling.  Gastrointestinal:  Positive for diarrhea. Negative for abdominal pain, blood in stool, melena, nausea and vomiting.  Genitourinary: Negative.  Negative for dysuria.  Musculoskeletal: Negative.  Negative for back pain, joint pain and neck pain.  Skin: Negative.  Negative for rash.  Neurological:  Positive for tingling, sensory change and weakness. Negative for focal weakness and headaches.   Psychiatric/Behavioral: Negative.  The patient is not nervous/anxious and does not have insomnia.     As per HPI. Otherwise, a complete review of systems is negative.   PAST MEDICAL HISTORY: Past Medical History:  Diagnosis Date   Anemia due to antineoplastic chemotherapy    Aortic atherosclerosis (HCC)    Arthritis    Atrial fibrillation and flutter (HCC)    a.) 04/2023 Zio: Monitor 1 - 16 % aflutter burden w/ avg rate of 168, (78-226), possible Atach. Freq PACs, rare PVCs. Monitor 2 - 15% afib/flutter burden @ 115 [id number]). Poss Atach. 4 beats NSVT. Freq PACs, rare PVCs; b.)  CHA2DS2-VASc = 4 (age x2, sex, vascular disease history); c.) cardiac rate/rhythm maintained on oral metoprolol  succinate; chronically anticoagulated using apixaban    Bronchial obstruction    Chemotherapy-induced neuropathy (HCC)    Chicken pox    Colon polyp    Coronary artery calcification seen on CT scan    a. 11/2022 CT Chest: Coronary artery calcification and aortic atherosclerosis.   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 04/2023 Echo: EF >55%, nl RV size/fxn.  No significant valvular disease observed.   Hyperlipidemia    Mass of right chest wall    Multiple lung nodules 09/2022   On apixaban  therapy    Osteoporosis    Pectus excavatum    Protein-calorie malnutrition, severe (HCC)    Rectal carcinoma (HCC)    a.) recurrent stage IVA (cT4a, cN2a, cM1a)   Thrombocytopenia (HCC)     PAST SURGICAL HISTORY: Past Surgical History:  Procedure Laterality Date  AXILLARY LYMPH NODE DISSECTION Right 08/21/2016   Procedure: AXILLARY LYMPH NODE excision;  Surgeon: Benancio Bracket, MD;  Location: [facility] ORS;  Service: General;  Laterality: Right;   CATARACT EXTRACTION, BILATERAL Bilateral    COLONOSCOPY N/A 09/19/2020   Procedure: COLONOSCOPY;  Surgeon:  Shane Darling, MD;  Location: [facility] ENDOSCOPY;  Service: Endoscopy;  Laterality: N/A;   ENDOBRONCHIAL ULTRASOUND Left 08/05/2023   Procedure: ENDOBRONCHIAL ULTRASOUND;  Surgeon: Marc Senior, MD;  Location: [facility] ORS;  Service: Pulmonary;  Laterality: Left;   PORTA CATH INSERTION N/A 09/16/2017   Procedure: PORTA CATH INSERTION;  Surgeon: Celso College, MD;  Location: [facility] INVASIVE CV LAB;  Service: Cardiovascular;  Laterality: N/A;   TONSILLECTOMY     TOTAL HIP ARTHROPLASTY Left 1992    FAMILY HISTORY: Family History  Problem Relation Age of Onset   Heart failure Mother    Emphysema Father    Diabetes Sister    Lung cancer Brother    Diabetes Brother    Breast cancer Paternal Aunt    Basal cell carcinoma Daughter     ADVANCED DIRECTIVES (Y/N):  N  HEALTH MAINTENANCE: Social History   Tobacco Use   Smoking status: Never   Smokeless tobacco: Never  Vaping Use   Vaping status: Never Used  Substance Use Topics   Alcohol use: No   Drug use: No     Colonoscopy:  PAP:  Bone density:  Lipid panel:  No Known Allergies  Current Outpatient Medications  Medication Sig Dispense Refill   amiodarone  (PACERONE ) 200 MG tablet Take 1 tablet (200 mg) by mouth twice daily for 2 weeks, then decrease to 1 tablet (200 mg) by mouth daily. 90 tablet 3   apixaban  (ELIQUIS ) 2.5 MG TABS tablet Take 1 tablet (2.5 mg total) by mouth 2 (two) times daily. 180 tablet 3   chlorpheniramine-HYDROcodone  (TUSSIONEX) 10-8 MG/5ML Take 5 mLs by mouth every 12 (twelve) hours as needed for cough. 115 mL 0   diclofenac sodium (VOLTAREN) 1 % GEL Apply 2 g topically 2 (two) times daily as needed.     diphenoxylate -atropine  (LOMOTIL ) 2.5-0.025 MG tablet TAKE 2 TABLETS BY MOUTH 4 (FOUR) TIMES DAILY AS NEEDED FOR DIARRHEA OR LOOSE STOOLS. 60 tablet 1   Fluticasone-Umeclidin-Vilant (TRELEGY ELLIPTA ) 100-62.5-25 MCG/ACT AEPB Inhale 1 Inhalation into the lungs daily in the afternoon. 2 each    gabapentin   (NEURONTIN ) 300 MG capsule TAKE 1 CAPSULE BY MOUTH TWICE A DAY 60 capsule 2   HYDROcodone -acetaminophen  (NORCO/VICODIN) 5-325 MG tablet Take 1 tablet by mouth every 6 (six) hours as needed for moderate pain (pain score 4-6). 30 tablet 0   levothyroxine  (SYNTHROID ) 25 MCG tablet TAKE 1 TABLET BY MOUTH EVERY DAY BEFORE BREAKFAST 30 tablet 11   lidocaine -prilocaine  (EMLA ) cream Apply 1 Application topically as needed. Apply to port 1 hour prior to use as needed 30 g 2   metoprolol  succinate (TOPROL -XL) 25 MG 24 hr tablet Take 25 mg by mouth daily.     mirtazapine  (REMERON ) 15 MG tablet Take 1 tablet (15 mg total) by mouth at bedtime. 30 tablet 0   pantoprazole  (PROTONIX ) 20 MG tablet Take 1 tablet by mouth daily as needed for heartburn.     potassium chloride  SA (KLOR-CON  M) 20 MEQ tablet TAKE 1 TABLET BY MOUTH TWICE A DAY 180 tablet 1   simvastatin  (ZOCOR ) 20 MG tablet Take 20 mg by mouth at bedtime.     traMADol-acetaminophen  (ULTRACET) 37.5-325 MG tablet Take 1 tablet by  mouth every 8 (eight) hours as needed.     Fluticasone Furoate  (ARNUITY ELLIPTA ) 100 MCG/ACT AEPB Inhale 1 puff into the lungs daily. (Patient not taking: Reported on 03/25/2024) 30 each 6   No current facility-administered medications for this visit.   Facility-Administered Medications Ordered in Other Visits  Medication Dose Route Frequency Provider Last Rate Last Admin   heparin  lock flush 100 unit/mL  500 Units Intravenous Once Trampus Mcquerry J, MD       heparin  lock flush 100 unit/mL  500 Units Intracatheter PRN Adrian Alba, Deadra Everts, MD       heparin  lock flush 100 unit/mL  500 Units Intravenous Once Vale Mousseau J, MD       heparin  lock flush 100 unit/mL  500 Units Intravenous Once Josue Falconi J, MD       sodium chloride  flush (NS) 0.9 % injection 10 mL  10 mL Intravenous PRN Oluwaferanmi Wain J, MD   10 mL at 12/04/17 0901   sodium chloride  flush (NS) 0.9 % injection 10 mL  10 mL Intracatheter PRN Shellie Dials, MD       sodium chloride  flush (NS) 0.9 % injection 10 mL  10 mL Intravenous PRN Luther Newhouse J, MD   10 mL at 04/24/21 0839   yttrium-90 injection 22.3 millicurie  22.3 millicurie Intravenous Once Chrystal, Allison Ivory, MD        OBJECTIVE: Vitals:   03/25/24 0918  BP: (!) 122/55  Pulse: 92  Resp: 16  Temp: 98 F (36.7 C)  SpO2: 99%       Body mass index is 16.51 kg/m.    ECOG FS:2 - Symptomatic, <50% confined to bed  General: Thin, no acute distress.  Sitting in a wheelchair. Eyes: Pink conjunctiva, anicteric sclera. HEENT: Normocephalic, moist mucous membranes. Lungs: No audible wheezing or coughing. Heart: Regular rate and rhythm. Abdomen: Soft, nontender, no obvious distention. Musculoskeletal: No edema, cyanosis, or clubbing. Neuro: Alert, answering all questions appropriately. Cranial nerves grossly intact. Skin: No rashes or petechiae noted. Psych: Normal affect.  LAB RESULTS:  Lab Results  Component Value Date   NA 135 03/23/2024   K 3.5 03/23/2024   CL 101 03/23/2024   CO2 25 03/23/2024   GLUCOSE 106 (H) 03/23/2024   BUN 17 03/23/2024   CREATININE 0.93 03/23/2024   CALCIUM  8.4 (L) 03/23/2024   PROT 6.3 (L) 03/23/2024   ALBUMIN 3.0 (L) 03/23/2024   AST 18 03/23/2024   ALT 12 03/23/2024   ALKPHOS 66 03/23/2024   BILITOT 0.7 03/23/2024   GFRNONAA >60 03/23/2024   GFRAA >60 06/28/2020    Lab Results  Component Value Date   WBC 13.3 (H) 03/25/2024   NEUTROABS 9.7 (H) 03/25/2024   HGB 10.7 (L) 03/25/2024   HCT 34.2 (L) 03/25/2024   MCV 93.2 03/25/2024   PLT 344 03/25/2024     STUDIES: No results found.    ONCOLOGY HISTORY:  Biopsy confirmed rectal cancer.  MRI completed at [facility] on October 11, 2020 revealed extension of the tumor beyond the wall into the rectovaginal recess with suspected invasion of the vaginal wall posteriorly.  Tumor also appears to involve the internal anal sphincter.  There are also numerous prominent  presacral and mesorectal lymph nodes highly suspicious for malignancy.  PET scan results from November 17, 2020 reviewed independently with 3 hypermetabolic right lower lobe lung nodules consistent with pulmonary metastasis.  Patient completed cycle 8 of FOLFOX on Mar 29, 2021.  She then completed  cycle 5 of her 5-FU pump on May 26, 2021 and XRT on June 01, 2021.  PET scan results from July 17, 2021 reviewed independently with no evidence of disease other than enlarging right lower lobe lung lesion.  Patient completed SBRT to the right lung lesion on August 22, 2021.  Repeat PET scan on July 25, 2021 revealed minimal residual disease in the rectum and likely resolution of lesions in the lung.  PET scan results from Mar 21, 2023 reviewed independently with clear progression of disease in patient's pelvis as well as multiple pulmonary nodules.  Patient subsequently was placed on FOLFOX plus Avastin  again between April 10, 2023 and October 16, 2023.  CT scan results from November 11, 2023 with significant improvement of disease burden except for her lung masses which have increased in size.  Circular gene testing was positive for PD-L1 expression as well as ATM and CHEK2 mutations. Given the PD-L1 positivity, patient has agreed to pursue treatment with single agent Keytruda  which she received from November 27, 2023 through January 29, 2024.  Patient was once again noted to have progression of disease.  ASSESSMENT: Recurrent stage IV rectal cancer, history of follicular lymphoma.  PLAN:    Recurrent stage IV rectal cancer: See oncology history as above.  Keytruda  has been discontinued.  Hospice and comfort care have been discussed in the past, but patient is not ready to make this decision.  Patient has never received irinotecan  based therapy, so we will proceed with FOLFIRI. Will dose reduce treatment 25% from the onset.  Patient will also receive 1 L of IV fluids as well as Udenyca  with her pump removal 2  days later.  This will be a 21-day cycle.  Patient's weight has improved, therefore will proceed cautiously with cycle 2 of treatment today.  Return to clinic in 2 days for pump removal and IV fluids.  Patient will then return to clinic in 1 week for laboratory work and further evaluation.  She will then return to clinic in 3 weeks for further evaluation and consideration of cycle 3.  Will reimage after 4 cycles. Left lower lobe lung lesion: It is possible her lung lesions are second primary originating in lung, but previous biopsy from bronchoscopy did not reveal any evidence of malignancy.  Will treat as metastatic rectal cancer, although irinotecan  may have some effectiveness in lung malignancy. Recurrent follicular lymphoma: CT scan results from December 23, 2019 reviewed independently with no obvious evidence of recurrent or progressive disease.  PET scan results as above consistent with rectal cancer metastasis and no evidence of lymphoma.   Patient received Zevalin  on July 30, 2017 with not much therapeutic effect.  She subsequently underwent cycles 5 of R-CHOP chemotherapy with Neulasta  support completing on December 12, 2017.  Patient continues to be in complete remission.  Neuropathy: Chronic and unchanged. Pain: Patient does not complain of this today.  Continue tramadol and/or Norco as needed.   Poor appetite/weight loss: Improved with mirtazapine .  Appreciate dietary and palliative care input.  Coping/adjustment: Patient was given a second referral to [facility]. Atrial fibrillation: Appreciate cardiology input.  Patient now on amiodarone . Pelvic fracture: No interval needed at this time.  Continue symptomatic treatment with tramadol as above. Cough: Chronic and changed.  Continue Tussionex as prescribed. Diarrhea: Patient will require atropine  as ordered with her treatments.  Continue Lomotil  as prescribed. Anemia: Patient's hemoglobin has trended down slightly to 10.7. Leukocytosis:  Likely reactive, monitor. Thrombocytopenia: Resolved.   Patient expressed  understanding and was in agreement with this plan. She also understands that She can call clinic at any time with any questions, concerns, or complaints.    Cancer Staging  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 [facility]) Staging form: Lymphoid Neoplasms, AJCC 6th Edition - Clinical stage from 08/27/2016: Stage IIE - Signed by Shellie Dials, MD on 08/27/2016  Rectal cancer [facility]) Staging form: Colon and Rectum, AJCC 8th Edition - Clinical: Stage IVA [name], cM1a) - Signed by Shellie Dials, MD on 11/02/2020   Shellie Dials, MD   03/25/2024 9:35 AM

## 2024-03-25 NOTE — Progress Notes (Signed)
 Wants to make sure she has something for diarrhea prior to treatment. Lomotil  did not help at home after last tx. Needs an alternative.

## 2024-03-27 ENCOUNTER — Inpatient Hospital Stay

## 2024-03-27 VITALS — BP 133/66 | HR 78 | Temp 98.1°F | Resp 18

## 2024-03-27 DIAGNOSIS — C20 Malignant neoplasm of rectum: Secondary | ICD-10-CM

## 2024-03-27 DIAGNOSIS — Z5111 Encounter for antineoplastic chemotherapy: Secondary | ICD-10-CM | POA: Diagnosis not present

## 2024-03-27 MED ORDER — SODIUM CHLORIDE 0.9 % IV SOLN
Freq: Once | INTRAVENOUS | Status: AC
  Filled 2024-03-27: qty 250

## 2024-03-27 MED ORDER — HEPARIN SOD (PORK) LOCK FLUSH 100 UNIT/ML IV SOLN
500.0000 [IU] | Freq: Once | INTRAVENOUS | Status: AC | PRN
Start: 2024-03-27 — End: 2024-03-27
  Administered 2024-03-27: 500 [IU]
  Filled 2024-03-27: qty 5

## 2024-03-27 MED ORDER — PEGFILGRASTIM-CBQV 6 MG/0.6ML ~~LOC~~ SOSY
6.0000 mg | PREFILLED_SYRINGE | Freq: Once | SUBCUTANEOUS | Status: AC
  Administered 2024-03-27: 6 mg via SUBCUTANEOUS
  Filled 2024-03-27: qty 0.6

## 2024-03-31 ENCOUNTER — Inpatient Hospital Stay
Admission: EM | Admit: 2024-03-31 | Discharge: 2024-04-03 | DRG: 871 | Disposition: A | Discharge status: Hospice | Attending: Internal Medicine | Admitting: Internal Medicine

## 2024-03-31 ENCOUNTER — Telehealth: Payer: Self-pay | Admitting: *Deleted

## 2024-03-31 ENCOUNTER — Ambulatory Visit

## 2024-03-31 ENCOUNTER — Encounter: Admitting: Hospice and Palliative Medicine

## 2024-03-31 ENCOUNTER — Other Ambulatory Visit: Payer: Self-pay

## 2024-03-31 ENCOUNTER — Emergency Department

## 2024-03-31 ENCOUNTER — Other Ambulatory Visit

## 2024-03-31 ENCOUNTER — Encounter: Payer: Self-pay | Admitting: Emergency Medicine

## 2024-03-31 DIAGNOSIS — B379 Candidiasis, unspecified: Secondary | ICD-10-CM | POA: Diagnosis present

## 2024-03-31 DIAGNOSIS — C799 Secondary malignant neoplasm of unspecified site: Secondary | ICD-10-CM | POA: Diagnosis present

## 2024-03-31 DIAGNOSIS — E43 Unspecified severe protein-calorie malnutrition: Secondary | ICD-10-CM | POA: Diagnosis present

## 2024-03-31 DIAGNOSIS — Z7401 Bed confinement status: Secondary | ICD-10-CM

## 2024-03-31 DIAGNOSIS — R0602 Shortness of breath: Principal | ICD-10-CM

## 2024-03-31 DIAGNOSIS — E860 Dehydration: Secondary | ICD-10-CM | POA: Diagnosis present

## 2024-03-31 DIAGNOSIS — Z1152 Encounter for screening for COVID-19: Secondary | ICD-10-CM | POA: Diagnosis not present

## 2024-03-31 DIAGNOSIS — R54 Age-related physical debility: Secondary | ICD-10-CM | POA: Diagnosis present

## 2024-03-31 DIAGNOSIS — Z833 Family history of diabetes mellitus: Secondary | ICD-10-CM

## 2024-03-31 DIAGNOSIS — Z8249 Family history of ischemic heart disease and other diseases of the circulatory system: Secondary | ICD-10-CM

## 2024-03-31 DIAGNOSIS — K6289 Other specified diseases of anus and rectum: Secondary | ICD-10-CM | POA: Diagnosis present

## 2024-03-31 DIAGNOSIS — D6481 Anemia due to antineoplastic chemotherapy: Secondary | ICD-10-CM | POA: Diagnosis present

## 2024-03-31 DIAGNOSIS — G8929 Other chronic pain: Secondary | ICD-10-CM | POA: Diagnosis present

## 2024-03-31 DIAGNOSIS — C8208 Follicular lymphoma grade I, lymph nodes of multiple sites: Secondary | ICD-10-CM | POA: Diagnosis present

## 2024-03-31 DIAGNOSIS — Z79890 Hormone replacement therapy (postmenopausal): Secondary | ICD-10-CM | POA: Diagnosis not present

## 2024-03-31 DIAGNOSIS — C820A FollIcular lymphoma grade i, in remission: Secondary | ICD-10-CM | POA: Diagnosis present

## 2024-03-31 DIAGNOSIS — F5102 Adjustment insomnia: Secondary | ICD-10-CM | POA: Diagnosis present

## 2024-03-31 DIAGNOSIS — Z825 Family history of asthma and other chronic lower respiratory diseases: Secondary | ICD-10-CM

## 2024-03-31 DIAGNOSIS — E785 Hyperlipidemia, unspecified: Secondary | ICD-10-CM | POA: Diagnosis present

## 2024-03-31 DIAGNOSIS — E7800 Pure hypercholesterolemia, unspecified: Secondary | ICD-10-CM | POA: Diagnosis present

## 2024-03-31 DIAGNOSIS — I480 Paroxysmal atrial fibrillation: Secondary | ICD-10-CM | POA: Diagnosis present

## 2024-03-31 DIAGNOSIS — A419 Sepsis, unspecified organism: Secondary | ICD-10-CM | POA: Diagnosis not present

## 2024-03-31 DIAGNOSIS — I959 Hypotension, unspecified: Secondary | ICD-10-CM

## 2024-03-31 DIAGNOSIS — E876 Hypokalemia: Secondary | ICD-10-CM | POA: Diagnosis present

## 2024-03-31 DIAGNOSIS — R3911 Hesitancy of micturition: Secondary | ICD-10-CM | POA: Diagnosis present

## 2024-03-31 DIAGNOSIS — E039 Hypothyroidism, unspecified: Secondary | ICD-10-CM | POA: Diagnosis present

## 2024-03-31 DIAGNOSIS — I9589 Other hypotension: Secondary | ICD-10-CM | POA: Diagnosis present

## 2024-03-31 DIAGNOSIS — Z96642 Presence of left artificial hip joint: Secondary | ICD-10-CM | POA: Diagnosis present

## 2024-03-31 DIAGNOSIS — Z79899 Other long term (current) drug therapy: Secondary | ICD-10-CM

## 2024-03-31 DIAGNOSIS — I2510 Atherosclerotic heart disease of native coronary artery without angina pectoris: Secondary | ICD-10-CM | POA: Diagnosis present

## 2024-03-31 DIAGNOSIS — Z515 Encounter for palliative care: Secondary | ICD-10-CM

## 2024-03-31 DIAGNOSIS — Z66 Do not resuscitate: Secondary | ICD-10-CM | POA: Diagnosis present

## 2024-03-31 DIAGNOSIS — T451X5A Adverse effect of antineoplastic and immunosuppressive drugs, initial encounter: Secondary | ICD-10-CM | POA: Diagnosis present

## 2024-03-31 DIAGNOSIS — Z7951 Long term (current) use of inhaled steroids: Secondary | ICD-10-CM

## 2024-03-31 DIAGNOSIS — M810 Age-related osteoporosis without current pathological fracture: Secondary | ICD-10-CM | POA: Diagnosis present

## 2024-03-31 DIAGNOSIS — R64 Cachexia: Secondary | ICD-10-CM | POA: Diagnosis present

## 2024-03-31 DIAGNOSIS — Z7901 Long term (current) use of anticoagulants: Secondary | ICD-10-CM

## 2024-03-31 DIAGNOSIS — Z681 Body mass index (BMI) 19 or less, adult: Secondary | ICD-10-CM | POA: Diagnosis not present

## 2024-03-31 DIAGNOSIS — C20 Malignant neoplasm of rectum: Secondary | ICD-10-CM | POA: Diagnosis present

## 2024-03-31 DIAGNOSIS — R6520 Severe sepsis without septic shock: Secondary | ICD-10-CM | POA: Diagnosis present

## 2024-03-31 DIAGNOSIS — Z923 Personal history of irradiation: Secondary | ICD-10-CM

## 2024-03-31 DIAGNOSIS — R197 Diarrhea, unspecified: Secondary | ICD-10-CM | POA: Insufficient documentation

## 2024-03-31 DIAGNOSIS — Z803 Family history of malignant neoplasm of breast: Secondary | ICD-10-CM

## 2024-03-31 DIAGNOSIS — Z808 Family history of malignant neoplasm of other organs or systems: Secondary | ICD-10-CM

## 2024-03-31 DIAGNOSIS — R627 Adult failure to thrive: Secondary | ICD-10-CM | POA: Diagnosis present

## 2024-03-31 DIAGNOSIS — K219 Gastro-esophageal reflux disease without esophagitis: Secondary | ICD-10-CM | POA: Diagnosis present

## 2024-03-31 DIAGNOSIS — Z801 Family history of malignant neoplasm of trachea, bronchus and lung: Secondary | ICD-10-CM

## 2024-03-31 LAB — COMPREHENSIVE METABOLIC PANEL WITH GFR
ALT: 14 U/L (ref 0–44)
AST: 17 U/L (ref 15–41)
Albumin: 3 g/dL — ABNORMAL LOW (ref 3.5–5.0)
Alkaline Phosphatase: 75 U/L (ref 38–126)
Anion gap: 10 (ref 5–15)
BUN: 19 mg/dL (ref 8–23)
CO2: 23 mmol/L (ref 22–32)
Calcium: 8.6 mg/dL — ABNORMAL LOW (ref 8.9–10.3)
Chloride: 104 mmol/L (ref 98–111)
Creatinine, Ser: 0.7 mg/dL (ref 0.44–1.00)
GFR, Estimated: >60 mL/min (ref >60)
Glucose, Bld: 87 mg/dL (ref 70–99)
Potassium: 4 mmol/L (ref 3.5–5.1)
Sodium: 137 mmol/L (ref 135–145)
Total Bilirubin: 0.8 mg/dL (ref 0.0–1.2)
Total Protein: 5.9 g/dL — ABNORMAL LOW (ref 6.5–8.1)

## 2024-03-31 LAB — CBC WITH DIFFERENTIAL/PLATELET
Abs Immature Granulocytes: 0.11 10*3/uL — ABNORMAL HIGH (ref 0.00–0.07)
Basophils Absolute: 0 10*3/uL (ref 0.0–0.1)
Basophils Relative: 0 %
Eosinophils Absolute: 0 10*3/uL (ref 0.0–0.5)
Eosinophils Relative: 0 %
HCT: 37.5 % (ref 36.0–46.0)
Hemoglobin: 11.8 g/dL — ABNORMAL LOW (ref 12.0–15.0)
Immature Granulocytes: 1 %
Lymphocytes Relative: 8 %
Lymphs Abs: 0.9 10*3/uL (ref 0.7–4.0)
MCH: 29.5 pg (ref 26.0–34.0)
MCHC: 31.5 g/dL (ref 30.0–36.0)
MCV: 93.8 fL (ref 80.0–100.0)
Monocytes Absolute: 0.9 10*3/uL (ref 0.1–1.0)
Monocytes Relative: 7 %
Neutro Abs: 9.8 10*3/uL — ABNORMAL HIGH (ref 1.7–7.7)
Neutrophils Relative %: 84 %
Platelets: 172 10*3/uL (ref 150–400)
RBC: 4 MIL/uL (ref 3.87–5.11)
RDW: 16.4 % — ABNORMAL HIGH (ref 11.5–15.5)
Smear Review: NORMAL
WBC: 11.7 10*3/uL — ABNORMAL HIGH (ref 4.0–10.5)
nRBC: 0 % (ref 0.0–0.2)

## 2024-03-31 LAB — C DIFFICILE QUICK SCREEN W PCR REFLEX
C Diff antigen: NEGATIVE
C Diff interpretation: NOT DETECTED
C Diff toxin: NEGATIVE

## 2024-03-31 LAB — RESP PANEL BY RT-PCR (RSV, FLU A&B, COVID)  RVPGX2
Influenza A by PCR: NEGATIVE
Influenza B by PCR: NEGATIVE
Resp Syncytial Virus by PCR: NEGATIVE
SARS Coronavirus 2 by RT PCR: NEGATIVE

## 2024-03-31 LAB — LACTIC ACID, PLASMA
Lactic Acid, Venous: 3.5 mmol/L (ref 0.5–1.9)
Lactic Acid, Venous: 2.4 mmol/L (ref 0.5–1.9)

## 2024-03-31 LAB — TROPONIN I (HIGH SENSITIVITY)
Troponin I (High Sensitivity): 68 ng/L — ABNORMAL HIGH (ref <18)
Troponin I (High Sensitivity): 23 ng/L — ABNORMAL HIGH (ref <18)

## 2024-03-31 LAB — BRAIN NATRIURETIC PEPTIDE: B Natriuretic Peptide: 606.8 pg/mL — ABNORMAL HIGH (ref 0.0–100.0)

## 2024-03-31 MED ORDER — AMIODARONE HCL 200 MG PO TABS
200.0000 mg | ORAL_TABLET | Freq: Every day | ORAL | Status: DC
  Administered 2024-04-01 – 2024-04-03 (×3): 200 mg via ORAL
  Filled 2024-03-31 (×3): qty 1

## 2024-03-31 MED ORDER — GABAPENTIN 300 MG PO CAPS
300.0000 mg | ORAL_CAPSULE | Freq: Two times a day (BID) | ORAL | Status: DC
  Administered 2024-03-31 – 2024-04-03 (×6): 300 mg via ORAL
  Filled 2024-03-31 (×6): qty 1

## 2024-03-31 MED ORDER — LACTATED RINGERS IV BOLUS
1000.0000 mL | Freq: Once | INTRAVENOUS | Status: AC
  Administered 2024-03-31: 1000 mL via INTRAVENOUS

## 2024-03-31 MED ORDER — APIXABAN 2.5 MG PO TABS
2.5000 mg | ORAL_TABLET | Freq: Two times a day (BID) | ORAL | Status: DC
  Administered 2024-03-31 – 2024-04-03 (×6): 2.5 mg via ORAL
  Filled 2024-03-31 (×7): qty 1

## 2024-03-31 MED ORDER — SODIUM CHLORIDE 0.9 % IV SOLN
2.0000 g | Freq: Two times a day (BID) | INTRAVENOUS | Status: DC
  Administered 2024-04-01 (×2): 2 g via INTRAVENOUS
  Filled 2024-03-31 (×3): qty 12.5

## 2024-03-31 MED ORDER — ONDANSETRON HCL 4 MG/2ML IJ SOLN: 4.0000 mg | Freq: Four times a day (QID) | INTRAMUSCULAR | Status: DC | PRN

## 2024-03-31 MED ORDER — SIMVASTATIN 20 MG PO TABS
20.0000 mg | ORAL_TABLET | Freq: Every day | ORAL | Status: DC
  Administered 2024-03-31 – 2024-04-02 (×3): 20 mg via ORAL
  Filled 2024-03-31 (×3): qty 1

## 2024-03-31 MED ORDER — SODIUM CHLORIDE 0.9% FLUSH: 10.0000 mL | INTRAVENOUS | Status: DC | PRN

## 2024-03-31 MED ORDER — MIRTAZAPINE 15 MG PO TABS
15.0000 mg | ORAL_TABLET | Freq: Every day | ORAL | Status: DC
  Administered 2024-03-31 – 2024-04-02 (×3): 15 mg via ORAL
  Filled 2024-03-31 (×3): qty 1

## 2024-03-31 MED ORDER — SODIUM CHLORIDE 0.9 % IV SOLN
2.0000 g | Freq: Once | INTRAVENOUS | Status: AC
  Administered 2024-03-31: 2 g via INTRAVENOUS
  Filled 2024-03-31: qty 12.5

## 2024-03-31 MED ORDER — METRONIDAZOLE 500 MG/100ML IV SOLN
500.0000 mg | Freq: Once | INTRAVENOUS | Status: AC
  Administered 2024-03-31: 500 mg via INTRAVENOUS
  Filled 2024-03-31: qty 100

## 2024-03-31 MED ORDER — CHLORHEXIDINE GLUCONATE CLOTH 2 % EX PADS
6.0000 | MEDICATED_PAD | Freq: Every day | CUTANEOUS | Status: DC
  Administered 2024-04-01 – 2024-04-03 (×3): 6 via TOPICAL
  Filled 2024-03-31: qty 6

## 2024-03-31 MED ORDER — ONDANSETRON HCL 4 MG PO TABS: 4.0000 mg | ORAL_TABLET | Freq: Four times a day (QID) | ORAL | Status: DC | PRN

## 2024-03-31 MED ORDER — BUDESON-GLYCOPYRROL-FORMOTEROL 160-9-4.8 MCG/ACT IN AERO
2.0000 | INHALATION_SPRAY | Freq: Two times a day (BID) | RESPIRATORY_TRACT | Status: DC
  Administered 2024-04-01: 2 via RESPIRATORY_TRACT
  Filled 2024-03-31: qty 5.9

## 2024-03-31 MED ORDER — SENNOSIDES-DOCUSATE SODIUM 8.6-50 MG PO TABS: 1.0000 | ORAL_TABLET | Freq: Every evening | ORAL | Status: DC | PRN

## 2024-03-31 MED ORDER — METRONIDAZOLE 500 MG/100ML IV SOLN
500.0000 mg | Freq: Two times a day (BID) | INTRAVENOUS | Status: DC
  Administered 2024-04-01 (×2): 500 mg via INTRAVENOUS
  Filled 2024-03-31 (×3): qty 100

## 2024-03-31 MED ORDER — VANCOMYCIN HCL 750 MG/150ML IV SOLN
750.0000 mg | Freq: Once | INTRAVENOUS | Status: AC
  Administered 2024-03-31: 750 mg via INTRAVENOUS
  Filled 2024-03-31: qty 150

## 2024-03-31 MED ORDER — ACETAMINOPHEN 325 MG PO TABS
650.0000 mg | ORAL_TABLET | Freq: Four times a day (QID) | ORAL | Status: DC | PRN
Start: 2024-03-31 — End: 2024-04-05
  Administered 2024-03-31: 650 mg via ORAL
  Filled 2024-03-31: qty 2

## 2024-03-31 MED ORDER — HYDROCOD POLI-CHLORPHE POLI ER 10-8 MG/5ML PO SUER: 5.0000 mL | Freq: Two times a day (BID) | ORAL | Status: DC | PRN

## 2024-03-31 MED ORDER — VANCOMYCIN HCL 750 MG/150ML IV SOLN: 750.0000 mg | INTRAVENOUS | Status: DC

## 2024-03-31 MED ORDER — MELATONIN 5 MG PO TABS
5.0000 mg | ORAL_TABLET | Freq: Every evening | ORAL | Status: DC | PRN
  Administered 2024-03-31 – 2024-04-02 (×2): 5 mg via ORAL
  Filled 2024-03-31 (×2): qty 1

## 2024-03-31 MED ORDER — IOHEXOL 350 MG/ML SOLN
75.0000 mL | Freq: Once | INTRAVENOUS | Status: AC | PRN
  Administered 2024-03-31: 75 mL via INTRAVENOUS

## 2024-03-31 MED ORDER — SODIUM CHLORIDE 0.9% FLUSH
10.0000 mL | Freq: Two times a day (BID) | INTRAVENOUS | Status: DC
  Administered 2024-03-31 – 2024-04-03 (×6): 10 mL

## 2024-03-31 MED ORDER — LEVOTHYROXINE SODIUM 25 MCG PO TABS
25.0000 ug | ORAL_TABLET | Freq: Every day | ORAL | Status: DC
  Administered 2024-04-01 – 2024-04-03 (×3): 25 ug via ORAL
  Filled 2024-03-31 (×3): qty 1

## 2024-03-31 MED ORDER — VANCOMYCIN HCL IN DEXTROSE 1-5 GM/200ML-% IV SOLN: 1000.0000 mg | Freq: Once | INTRAVENOUS | Status: DC

## 2024-03-31 MED ORDER — ACETAMINOPHEN 650 MG RE SUPP: 650.0000 mg | Freq: Four times a day (QID) | RECTAL | Status: DC | PRN

## 2024-03-31 MED ORDER — HYDROCODONE-ACETAMINOPHEN 5-325 MG PO TABS: 1.0000 | ORAL_TABLET | Freq: Four times a day (QID) | ORAL | Status: DC | PRN

## 2024-03-31 NOTE — Telephone Encounter (Signed)
 I called Veronica Boyd again just to make sure that she got my message to come here at 11:00 and Veronica Boyd said that her blood pressure got worse she is really hard to even get up and she called the ambulance so she is going to the ER..  I canceled the appointments for today then.

## 2024-03-31 NOTE — Hospital Course (Signed)
 Ms. Kaylany Sheppard is an 78 year old female with history of hyperlipidemia, atrial fibrillation on Eliquis , metastatic rectal cancer, follicular lymphoma, presents to the emergency department for chief concerns of hypotension.  Vitals in the ED showed temperature 98.1, respiration rate 24, heart rate 92, blood pressure 103/56, SpO2 99% on room air.  Serum sodium is 137, potassium 4.0, chloride 104, bicarb 23, BUN of 19, serum creatinine of 0.70, EGFR greater than 60, nonfasting blood glucose 87, WBC 11.7, hemoglobin 11.8, platelets of 172.  Hesitancy 268 on repeat is 23.  COVID/influenza A/influenza B/RSV PCR were negative.  Lactic acid was elevated at 3.5 and on repeat was 2.4.  BNP was elevated 606.8.   ED treatment: LR 3 L bolus, metronidazole , vancomycin , cefepime .

## 2024-03-31 NOTE — Consult Note (Signed)
 Pharmacy Antibiotic Note  BURMA KETCHER is a 78 y.o. female admitted on 03/31/2024 with sepsis.  Pharmacy has been consulted for Vancomycin  dosing. Patient also receiving Cefepime  and Flagyl .  Plan: Vancomycin  750 mg IV x 1 as loading dose, followed by: 750 Q 48 hrs. Goal AUC 400-550. Expected AUC: 457 SCr used: 0.8(actual 0.7), Vd used: 0.72(TBW<IBW)   Height: 4\' 10"  (147.3 cm) Weight: 36.5 kg (80 lb 7.5 oz) IBW/kg (Calculated) : 40.9  Temp (24hrs), Avg:98 F (36.7 C), Min:97.8 F (36.6 C), Max:98.2 F (36.8 C)  Recent Labs  Lab 03/25/24 0913 03/31/24 1128 03/31/24 1329  WBC 13.3* 11.7*  --   CREATININE 0.71  --  0.70  LATICACIDVEN  --  3.5* 2.4*    Estimated Creatinine Clearance: 32.3 mL/min (by C-G formula based on SCr of 0.7 mg/dL).    No Known Allergies  Antimicrobials this admission: Vancomycin  5/27 >>  Cefepime  5/27 >>  Flagyl  5/27 >>  Dose adjustments this admission: N/A  Microbiology results: 5/27 BCx: collected 5/27 MRSA PCR: ordered  Thank you for allowing pharmacy to be a part of this patient's care.  Berry Godsey A Aariel Ems 03/31/2024 9:38 PM

## 2024-03-31 NOTE — H&P (Addendum)
 History and Physical   Veronica Boyd:[id number] DOB: [birth year] DOA: 03/31/2024  PCP: Veronica Baumgarten, MD  Outpatient Specialists: Dr. Adrian Boyd, medical oncology Patient coming from: home via EMS  I have personally briefly reviewed patient's old medical records in [facility] EMR.  Chief Concern: Hypotension  HPI: [name] is an 78 year old female with history of hyperlipidemia, atrial fibrillation on Eliquis , metastatic rectal cancer, follicular lymphoma, presents to the emergency department for chief concerns of hypotension.  Vitals in the ED showed temperature 98.1, respiration rate 24, heart rate 92, blood pressure 103/56, SpO2 99% on room air.  Serum sodium is 137, potassium 4.0, chloride 104, bicarb 23, BUN of 19, serum creatinine of 0.70, EGFR greater than 60, nonfasting blood glucose 87, WBC 11.7, hemoglobin 11.8, platelets of 172.  Hesitancy 268 on repeat is 23.  COVID/influenza A/influenza B/RSV PCR were negative.  Lactic acid was elevated at 3.5 and on repeat was 2.4.  BNP was elevated 606.8.   ED treatment: LR 3 L bolus, metronidazole , vancomycin , cefepime . ------------------------------ At bedside, patient was able to tell me her name, age, current location, current calendar year.  She reports the last time she urinated was yesterday. She reports poor appetite, po intake for months.   She endorses diarrhea, 2-3 days of diarrhea. She denies chest pain, abdominal pain, shortness of breath. She endorses dry cough that is baseline and nonproductive.  Social history: She lives at home with her daughter. She denies tobacco, etoh, and recreational drug use. She is retired and formerly worked in [employer].  ROS: Constitutional: no weight change, no fever ENT/Mouth: no sore throat, no rhinorrhea Eyes: no eye pain, no vision changes Cardiovascular: no chest pain, no dyspnea,  no edema, no palpitations Respiratory: no cough, no sputum, no  wheezing Gastrointestinal: no nausea, no vomiting, + diarrhea, no constipation Genitourinary: no urinary incontinence, no dysuria, no hematuria Musculoskeletal: no arthralgias, no myalgias Skin: no skin lesions, no pruritus, Neuro: + weakness, no loss of consciousness, no syncope Psych: no anxiety, no depression, + decrease appetite Heme/Lymph: no bruising, no bleeding  ED Course: Discussed with EDP, patient requiring hospitalization for meeting SIRS/sepsis criteria though source is unclear.  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)   Hyperlipidemia   Pure hypercholesterolemia   Postmenopausal osteoporosis   Transient insomnia   Protein-calorie malnutrition, severe   Rectal mass   Rectal cancer (HCC)   Paroxysmal atrial fibrillation (HCC)   Acquired hypothyroidism   Chronic pain   Diarrhea   Assessment and Plan:  * Severe sepsis with acute organ dysfunction [facility]) Patient had lactic acid patient, with hypotension Possible urine source UA has been ordered and pending collection at this time Bladder scan has been ordered, if positive will consider In-N-Out catheter versus Foley catheter Blood cultures x 2 are in process Continue with broad-spectrum antibiotic metronidazole  500 mg IV twice daily, vancomycin , cefepime  per pharmacy Maintain MAP greater than 65  Diarrhea Check GI panel, C. difficile PCR No antidiarrheal medication will be given until the diarrheal panel has been negative for infectious diarrhea  Chronic pain Neoplasm related PDMP reviewed Home hydrocodone -acetaminophen  5-325 mg p.o. 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   DVT prophylaxis: Apixaban  2.5 mg p.o. twice daily Code Status: DNR/DNI, confirmed with patient at bedside with son at bedside Diet: Regular diet Family Communication: Updated son, Veronica Boyd at bedside with patient's permission Disposition Plan: Pending clinical course Consults called: None at this time Admission status: PCU, inpatient  Past Medical History:  Diagnosis Date   Anemia due to antineoplastic chemotherapy    Aortic atherosclerosis (HCC)    Arthritis    Atrial fibrillation and flutter (HCC)    a.) 04/2023 Zio: Monitor 1 - 16 % aflutter burden w/ avg rate of 168, (78-226), possible Atach. Freq PACs, rare PVCs. Monitor 2 - 15% afib/flutter burden @ [phone] Poss Atach. 4 beats NSVT. Freq PACs, rare PVCs; b.)  CHA2DS2-VASc = 4 (age x2, sex, vascular disease history); c.) cardiac rate/rhythm maintained on oral metoprolol  succinate; chronically anticoagulated using apixaban    Bronchial obstruction    Chemotherapy-induced neuropathy (HCC)    Chicken pox    Colon polyp    Coronary artery calcification seen on CT scan    a. 11/2022 CT Chest: Coronary artery calcification and aortic atherosclerosis.   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 04/2023 Echo: EF >55%, nl RV size/fxn.  No significant valvular disease observed.   Hyperlipidemia    Mass of right chest wall    Multiple lung nodules 09/2022   On apixaban  therapy    Osteoporosis    Pectus excavatum    Protein-calorie malnutrition, severe (HCC)    Rectal carcinoma (HCC)    a.) recurrent stage IVA (cT4a, cN2a, cM1a)    Thrombocytopenia (HCC)    Past Surgical History:  Procedure Laterality Date   AXILLARY LYMPH NODE DISSECTION Right 08/21/2016   Procedure: AXILLARY LYMPH NODE excision;  Surgeon: Benancio Bracket, MD;  Location: [facility] ORS;  Service: General;  Laterality: Right;   CATARACT EXTRACTION, BILATERAL Bilateral    COLONOSCOPY N/A 09/19/2020   Procedure: COLONOSCOPY;  Surgeon: Shane Darling, MD;  Location: [facility] ENDOSCOPY;  Service: Endoscopy;  Laterality: N/A;   ENDOBRONCHIAL ULTRASOUND Left 08/05/2023   Procedure: ENDOBRONCHIAL ULTRASOUND;  Surgeon: Marc Senior, MD;  Location: [facility] ORS;  Service: Pulmonary;  Laterality: Left;   PORTA CATH INSERTION N/A 09/16/2017   Procedure: PORTA CATH INSERTION;  Surgeon: Celso College, MD;  Location: [facility] INVASIVE CV LAB;  Service: Cardiovascular;  Laterality: N/A;   TONSILLECTOMY     TOTAL HIP ARTHROPLASTY Left 1992    Social History:  reports that she has never smoked. She has never used smokeless tobacco. She reports that she does not drink alcohol and does not use drugs.  No Known Allergies Family History  Problem Relation Age of Onset   Heart failure Mother    Emphysema Father    Diabetes Sister    Lung cancer Brother    Diabetes Brother    Breast cancer Paternal Aunt    Basal cell carcinoma Daughter    Family history: Family history reviewed and not pertinent.  Prior to Admission medications   Medication Sig Start Date End Date Taking? Authorizing Provider  amiodarone  (PACERONE ) 200 MG tablet Take 1 tablet (200 mg) by mouth twice daily  for 2 weeks, then decrease to 1 tablet (200 mg) by mouth daily. 08/14/23   Riddle, Suzann, NP  apixaban  (ELIQUIS ) 2.5 MG TABS tablet Take 1 tablet (2.5 mg total) by mouth 2 (two) times daily. 12/09/23   Riddle, Suzann, NP  chlorpheniramine-HYDROcodone  (TUSSIONEX) 10-8 MG/5ML Take 5 mLs by mouth every 12 (twelve) hours as needed for cough. 03/09/24   Finnegan, Timothy J, MD  diclofenac sodium (VOLTAREN) 1 %  GEL Apply 2 g topically 2 (two) times daily as needed. 03/25/19   [provider]  diphenoxylate -atropine  (LOMOTIL ) 2.5-0.025 MG tablet TAKE 2 TABLETS BY MOUTH 4 (FOUR) TIMES DAILY AS NEEDED FOR DIARRHEA OR LOOSE STOOLS. 11/04/23   Shellie Dials, MD  Fluticasone Furoate  (ARNUITY ELLIPTA ) 100 MCG/ACT AEPB Inhale 1 puff into the lungs daily. Patient not taking: Reported on 03/25/2024 01/09/24   Marc Senior, MD  Fluticasone-Umeclidin-Vilant (TRELEGY ELLIPTA ) 100-62.5-25 MCG/ACT AEPB Inhale 1 Inhalation into the lungs daily in the afternoon. 03/10/24   Marc Senior, MD  gabapentin  (NEURONTIN ) 300 MG capsule TAKE 1 CAPSULE BY MOUTH TWICE A DAY 01/22/24   Shellie Dials, MD  HYDROcodone -acetaminophen  (NORCO/VICODIN) 5-325 MG tablet Take 1 tablet by mouth every 6 (six) hours as needed for moderate pain (pain score 4-6). 02/19/24   Shellie Dials, MD  levothyroxine  (SYNTHROID ) 25 MCG tablet TAKE 1 TABLET BY MOUTH EVERY DAY BEFORE BREAKFAST 03/09/24   Riddle, Suzann, NP  lidocaine -prilocaine  (EMLA ) cream Apply 1 Application topically as needed. Apply to port 1 hour prior to use as needed 11/19/23   Finnegan, Timothy J, MD  metoprolol  succinate (TOPROL -XL) 25 MG 24 hr tablet Take 25 mg by mouth daily. 07/02/23   [provider]  mirtazapine  (REMERON ) 15 MG tablet Take 1 tablet (15 mg total) by mouth at bedtime. 03/18/24   Shellie Dials, MD  pantoprazole  (PROTONIX ) 20 MG tablet Take 1 tablet by mouth daily as needed for heartburn. 08/22/21   [provider]  potassium chloride  SA (KLOR-CON  M) 20 MEQ tablet TAKE 1 TABLET BY MOUTH TWICE A DAY 02/21/24   Shellie Dials, MD  simvastatin  (ZOCOR ) 20 MG tablet Take 20 mg by mouth at bedtime.    [provider]  traMADol-acetaminophen  (ULTRACET) 37.5-325 MG tablet Take 1 tablet by mouth every 8 (eight) hours as needed. 08/25/18   [provider]  prochlorperazine  (COMPAZINE ) 10 MG tablet TAKE 1  TABLET (10 MG TOTAL) BY MOUTH EVERY 6 (SIX) HOURS AS NEEDED (NAUSEA OR VOMITING). 02/20/21 04/18/21  Aurther Blue, NP    Physical Exam: Vitals:   03/31/24 1800 03/31/24 1825 03/31/24 1900 03/31/24 1944  BP: (!) 115/49  (!) 114/54 105/80  Pulse: 89  91 93  Resp: 18  19   Temp:  98.1 F (36.7 C)  98.2 F (36.8 C)  TempSrc:  Oral  Oral  SpO2: 98%  98% 98%  Weight:    36.5 kg  Height:    4\' 10"  (1.473 m)   Constitutional: appears frail, cachectic appearing, calm Eyes: PERRL, lids and conjunctivae normal ENMT: Mucous membranes are moist. Posterior pharynx clear of any exudate or lesions. Age-appropriate dentition. Hearing appropriate Neck: normal, supple, no masses, no thyromegaly Respiratory: clear to auscultation bilaterally, no wheezing, no crackles. Normal respiratory effort. No accessory muscle use.  Cardiovascular: Regular rate and rhythm, no murmurs / rubs / gallops. No extremity edema. 2+ pedal pulses. No carotid bruits.  Abdomen: no tenderness, no masses palpated, no hepatosplenomegaly. Bowel sounds positive.  Musculoskeletal: no clubbing / cyanosis. No joint deformity upper and lower extremities. Good ROM, no contractures, no atrophy. Normal muscle tone.  Skin: no rashes, lesions, ulcers. No induration Neurologic: Sensation intact. Strength 5/5 in all 4.  Psychiatric: Normal judgment and insight. Alert and oriented x 3. Normal mood.   EKG: Ordered by EDP and pending complete at this time  Chest x-ray on Admission: I personally reviewed and I agree with radiologist reading as below.  CT Angio Chest PE W/Cm &/Or Wo Cm Result Date: 03/31/2024 CLINICAL DATA:  Metastatic rectal cancer, lymphoma presenting with hypotension, increasing weakness, and shortness of breath on exertion EXAM: CT ANGIOGRAPHY CHEST WITH CONTRAST TECHNIQUE: Multidetector CT imaging of the chest was performed using the standard protocol during bolus administration of intravenous contrast. Multiplanar CT  image reconstructions and MIPs were obtained to evaluate the vascular anatomy. RADIATION DOSE REDUCTION: This exam was performed according to the departmental dose-optimization program which includes automated exposure control, adjustment of the mA and/or kV according to patient size and/or use of iterative reconstruction technique. CONTRAST:  75mL OMNIPAQUE  IOHEXOL  350 MG/ML SOLN COMPARISON:  Radiograph 03/31/2024 and CT 02/12/2024 FINDINGS: Cardiovascular: No pericardial effusion. Normal caliber thoracic aorta without evidence of dissection. Coronary artery and aortic atherosclerotic calcification. Negative for acute pulmonary embolism. Right chest wall Port-A-Cath tip at the superior cavoatrial junction. Mediastinum/Nodes: No mediastinal lymphadenopathy. Trachea and esophagus are unremarkable. Lungs/Pleura: No pleural effusion or pneumothorax. Slightly increased size of the infrahilar mass in the right lower lobe measuring 4.8 x 5.2 cm, previously 4.8 x 5.0 cm. Obstruction of the right lower lobe bronchi with near complete atelectasis of the right lower lobe is not substantially changed from 02/12/2024. Numerous bilateral pulmonary nodules are redemonstrated. The largest on the left is in the left lower lobe and measures 2.9 x 4.0 cm on series 6/image 79, previously 2.9 x 3.7 cm. The largest on the right is in the anterior right upper lobe and measures 2.4 x 1.3 cm, similar to 02/12/2024. The previous ground-glass opacities have nearly resolved. Upper Abdomen: No acute abnormality. Musculoskeletal: No acute fracture or destructive osseous lesion. Review of the MIP images confirms the above findings. IMPRESSION: 1. Negative for acute pulmonary embolism. 2. Progression of disease with increased size of the right infrahilar mass and dominant left lower lobe nodule. 3. Aortic Atherosclerosis (ICD10-I70.0). Electronically Signed   By: Rozell Cornet M.D.   On: 03/31/2024 17:55   DG Chest Portable 1 View Result  Date: 03/31/2024 CLINICAL DATA:  SOB EXAM: PORTABLE CHEST - 1 VIEW COMPARISON:  08/05/2023 FINDINGS: Increase in size and number bibasilar pulmonary nodules. Stable right IJ port catheter. Heart size and mediastinal contours are within normal limits. Aortic Atherosclerosis (ICD10-170.0). No effusion. Visualized bones unremarkable. IMPRESSION: Increase in size and number of bibasilar pulmonary nodules. Electronically Signed   By: Nicoletta Barrier M.D.   On: 03/31/2024 13:37   Labs on Admission: I have personally reviewed following labs  CBC: Recent Labs  Lab 03/25/24 0913 03/31/24 1128  WBC 13.3* 11.7*  NEUTROABS 9.7* 9.8*  HGB 10.7* 11.8*  HCT 34.2* 37.5  MCV 93.2 93.8  PLT 344 172   Basic Metabolic Panel: Recent Labs  Lab 03/25/24 0913 03/31/24 1329  NA 137 137  K 3.5 4.0  CL 104 104  CO2 25 23  GLUCOSE 130* 87  BUN 22 19  CREATININE 0.71 0.70  CALCIUM  8.3* 8.6*  MG 1.9  --    GFR: Estimated Creatinine Clearance: 32.3 mL/min (by C-G formula  based on SCr of 0.7 mg/dL).  Liver Function Tests: Recent Labs  Lab 03/25/24 0913 03/31/24 1329  AST 22 17  ALT 17 14  ALKPHOS 60 75  BILITOT 0.4 0.8  PROT 6.2* 5.9*  ALBUMIN 2.9* 3.0*   Urine analysis:    Component Value Date/Time   COLORURINE AMBER (A) 11/07/2023 1015   APPEARANCEUR TURBID (A) 11/07/2023 1015   APPEARANCEUR Clear 06/16/2020 1444   LABSPEC 1.019 11/07/2023 1015   PHURINE 5.0 11/07/2023 1015   GLUCOSEU NEGATIVE 11/07/2023 1015   HGBUR LARGE (A) 11/07/2023 1015   BILIRUBINUR NEGATIVE 11/07/2023 1015   BILIRUBINUR Negative 06/16/2020 1444   KETONESUR NEGATIVE 11/07/2023 1015   PROTEINUR 100 (A) 11/07/2023 1015   NITRITE POSITIVE (A) 11/07/2023 1015   LEUKOCYTESUR MODERATE (A) 11/07/2023 1015   CRITICAL CARE Performed by: Dr. Reinhold Carbine  Total critical care time: 32 minutes  Critical care time was exclusive of separately billable procedures and treating other patients.  Critical care was necessary to treat or  prevent imminent or life-threatening deterioration.  Critical care was time spent personally by me on the following activities: development of treatment plan with patient and son, as well as nursing, discussions with consultants, evaluation of patient's response to treatment, examination of patient, obtaining history from patient or surrogate, ordering and performing treatments and interventions, ordering and review of laboratory studies, ordering and review of radiographic studies, pulse oximetry and re-evaluation of patient's condition.  This document was prepared using Dragon Voice Recognition software and may include unintentional dictation errors.  Dr. Reinhold Carbine [facility]  If 7PM-7AM, please contact overnight-coverage provider If 7AM-7PM, please contact day attending provider [email or web address]  03/31/2024, 9:42 PM

## 2024-03-31 NOTE — Assessment & Plan Note (Addendum)
 Check GI panel, C. difficile PCR No antidiarrheal medication will be given until the diarrheal panel has been negative for infectious diarrhea

## 2024-03-31 NOTE — Assessment & Plan Note (Addendum)
-   Continue outpatient follow-up with oncology

## 2024-03-31 NOTE — ED Triage Notes (Signed)
 Pt BIB EMS from home for hypotension.  BP in the 80's systolically.  22 to L hand and NS given by EMS.

## 2024-03-31 NOTE — Assessment & Plan Note (Signed)
 Protein calorie malnutrition resumed

## 2024-03-31 NOTE — Telephone Encounter (Signed)
 [name] the daughter called today saying that Veronica Boyd is not eating and drinking hardly anything and she needs to come in and get some IV fluids. Veronica Boyd is ok and I am waiting on when she can get the IV fluids and I will call the daughter back.  Daughter did say that she fell on Saturday she was having socks on and she fell down and hit the back of her head and has a little bump on it.  Her daughter says that she put ice on the back of the head but it still sore, then Sunday she got up to go to the dresser and she fell again.

## 2024-03-31 NOTE — ED Provider Notes (Signed)
 [facility] Provider Note    Event Date/Time   First MD Initiated Contact with Patient 03/31/24 1025     (approximate)   History   Chief Complaint Hypotension   HPI  Veronica Boyd is a 78 y.o. female with past medical history of hyperlipidemia, atrial fibrillation on Eliquis , metastatic rectal cancer, inflicted lymphoma who presents to the ED complaining of hypotension.  Patient reports that she has been feeling increasingly weak and generally ill for the past 2 days, now with shortness of breath particularly on exertion.  She denies any fevers or chest pain, has had a dry cough but denies any pain or swelling in her legs.  She had some diarrhea this morning and when EMS arrived, patient was noted to be hypotensive.  She was given 500 cc of IV fluids with improvement in BP.  She denies any abdominal pain, nausea, or vomiting, does endorse dysuria.  She continues to receive chemotherapy for metastatic rectal cancer.     Physical Exam   Triage Vital Signs: ED Triage Vitals  Encounter Vitals Group     BP 03/31/24 1030 (!) 127/110     Systolic BP Percentile --      Diastolic BP Percentile --      Pulse Rate 03/31/24 1030 (!) 103     Resp --      Temp 03/31/24 1031 97.8 F (36.6 C)     Temp Source 03/31/24 1031 Oral     SpO2 03/31/24 1030 100 %     Weight --      Height --      Head Circumference --      Peak Flow --      Pain Score --      Pain Loc --      Pain Education --      Exclude from Growth Chart --     Most recent vital signs: Vitals:   03/31/24 1430 03/31/24 1500  BP: (!) 104/54 (!) 112/50  Pulse: 94 93  Resp: 20 (!) 21  Temp:    SpO2: 100% 100%    Constitutional: Alert and oriented. Eyes: Conjunctivae are normal. Head: Atraumatic. Nose: No congestion/rhinnorhea. Mouth/Throat: Mucous membranes are moist.  Cardiovascular: Tachycardic, regular rhythm. Grossly normal heart sounds.  2+ radial pulses bilaterally. Respiratory:  Normal respiratory effort.  No retractions. Lungs CTAB. Gastrointestinal: Soft and nontender. No distention. Musculoskeletal: No lower extremity tenderness nor edema.  Neurologic:  Normal speech and language. No gross focal neurologic deficits are appreciated.    ED Results / Procedures / Treatments   Labs (all labs ordered are listed, but only abnormal results are displayed) Labs Reviewed  BRAIN NATRIURETIC PEPTIDE - Abnormal; Notable for the following components:      Result Value   B Natriuretic Peptide 606.8 (*)    All other components within normal limits  CBC WITH DIFFERENTIAL/PLATELET - Abnormal; Notable for the following components:   WBC 11.7 (*)    Hemoglobin 11.8 (*)    RDW 16.4 (*)    Neutro Abs 9.8 (*)    Abs Immature Granulocytes 0.11 (*)    All other components within normal limits  LACTIC ACID, PLASMA - Abnormal; Notable for the following components:   Lactic Acid, Venous 3.5 (*)    All other components within normal limits  LACTIC ACID, PLASMA - Abnormal; Notable for the following components:   Lactic Acid, Venous 2.4 (*)    All other components within normal limits  COMPREHENSIVE METABOLIC PANEL WITH GFR - Abnormal; Notable for the following components:   Calcium  8.6 (*)    Total Protein 5.9 (*)    Albumin 3.0 (*)    All other components within normal limits  TROPONIN I (HIGH SENSITIVITY) - Abnormal; Notable for the following components:   Troponin I (High Sensitivity) 68 (*)    All other components within normal limits  TROPONIN I (HIGH SENSITIVITY) - Abnormal; Notable for the following components:   Troponin I (High Sensitivity) 23 (*)    All other components within normal limits  RESP PANEL BY RT-PCR (RSV, FLU A&B, COVID)  RVPGX2  CULTURE, BLOOD (ROUTINE X 2)  CULTURE, BLOOD (ROUTINE X 2)  URINALYSIS, ROUTINE W REFLEX MICROSCOPIC     EKG  ED ECG REPORT I, Twilla Galea, the attending physician, personally viewed and interpreted this ECG.    Date: 03/31/2024  EKG Time: 10:36  Rate: 101  Rhythm: sinus tachycardia  Axis: Normal  Intervals:none  ST&T Change: Diffuse ST depressions, consistent with global ischemia  RADIOLOGY Chest x-ray reviewed and interpreted by me with no infiltrate, edema, or effusion.  PROCEDURES:  Critical Care performed: No  Procedures   MEDICATIONS ORDERED IN ED: Medications  metroNIDAZOLE  (FLAGYL ) IVPB 500 mg (500 mg Intravenous New Bag/Given 03/31/24 1457)  vancomycin  (VANCOREADY) IVPB 750 mg/150 mL (has no administration in time range)  sodium chloride  flush (NS) 0.9 % injection 10-40 mL (has no administration in time range)  sodium chloride  flush (NS) 0.9 % injection 10-40 mL (has no administration in time range)  Chlorhexidine  Gluconate Cloth 2 % PADS 6 each (has no administration in time range)  lactated ringers  bolus 1,000 mL (0 mLs Intravenous Stopped 03/31/24 1339)  ceFEPIme  (MAXIPIME ) 2 g in sodium chloride  0.9 % 100 mL IVPB (2 g Intravenous New Bag/Given 03/31/24 1428)  lactated ringers  bolus 1,000 mL (1,000 mLs Intravenous New Bag/Given 03/31/24 1429)     IMPRESSION / MDM / ASSESSMENT AND PLAN / ED COURSE  I reviewed the triage vital signs and the nursing notes.                              78 y.o. female with past medical history of hyperlipidemia, atrial fibrillation on Eliquis , rectal cancer, and follicular lymphoma who presents to the ED complaining of 2 days of generalized weakness with cough and shortness of breath.  Patient's presentation is most consistent with acute presentation with potential threat to life or bodily function.  Differential diagnosis includes, but is not limited to, sepsis, pneumonia, UTI, CHF, ACS, PE, anemia, electrolyte abnormality, AKI.  Patient chronically ill-appearing but in no acute distress, vital signs remarkable for tachycardia but otherwise reassuring.  She is not in any respiratory distress and maintaining oxygen saturations at 100% on room  air.  EKG shows sinus tachycardia with diffuse ST depressions consistent with global subendocardial ischemia.  Will continue IV fluid hydration and assess for sepsis with chest x-ray and urinalysis.  Labs with mild leukocytosis but no significant anemia, electrolyte abnormality, or AKI.  Lactic acid elevated but downtrending on recheck following IV fluids.  With hypotension, tachycardia, and elevated lactic acid, will cover for sepsis with broad-spectrum antibiotics.  No clear source of infection at this time as chest x-ray is unremarkable, urinalysis pending.  Given shortness of breath with patient receiving active chemotherapy, will further assess with CTA of her chest.  Patient turned over to oncoming provider pending CTA results,  anticipate admission.   FINAL CLINICAL IMPRESSION(S) / ED DIAGNOSES   Final diagnoses:  Shortness of breath  Hypotension, unspecified hypotension type     Rx / DC Orders   ED Discharge Orders     None        Note:  This document was prepared using Dragon voice recognition software and may include unintentional dictation errors.   Twilla Galea, MD 03/31/24 [phone]

## 2024-03-31 NOTE — Progress Notes (Signed)
 CODE SEPSIS - PHARMACY COMMUNICATION  **Broad Spectrum Antibiotics should be administered within 1 hour of Sepsis diagnosis**  Time Code Sepsis Called/Page Received: 1410  Antibiotics Ordered: cefepime , metronidazole , and vancomcyin  Time of 1st antibiotic administration: 1428  Additional action taken by pharmacy: None  If necessary, Name of Provider/Nurse Contacted: None    Ananias Balls ,PharmD Clinical Pharmacist  03/31/2024  2:11 PM

## 2024-03-31 NOTE — Assessment & Plan Note (Signed)
 Home amiodarone  200 mg daily resumed

## 2024-03-31 NOTE — Assessment & Plan Note (Signed)
 Neoplasm related PDMP reviewed Home hydrocodone -acetaminophen  5-325 mg p.o. every 6 hours as needed for moderate pain resumed on admission

## 2024-03-31 NOTE — Assessment & Plan Note (Signed)
Simvastatin 20 mg nightly resumed

## 2024-03-31 NOTE — Sepsis Progress Note (Signed)
 Elink monitoring for the code sepsis protocol.

## 2024-03-31 NOTE — ED Notes (Addendum)
 Report received from Jessica D RN, assuming pt care. Pt laying in bed with family at bedside. Pt provided with salines, graham crackers, and water. No other comfort measures requested at this time.

## 2024-03-31 NOTE — Assessment & Plan Note (Signed)
Home levothyroxine 25 mcg daily resumed

## 2024-03-31 NOTE — Assessment & Plan Note (Signed)
 Patient had lactic acid patient, with hypotension Possible urine source UA has been ordered and pending collection at this time Bladder scan has been ordered, if positive will consider In-N-Out catheter versus Foley catheter Blood cultures x 2 are in process Continue with broad-spectrum antibiotic metronidazole  500 mg IV twice daily, vancomycin , cefepime  per pharmacy Maintain MAP greater than 65

## 2024-03-31 NOTE — ED Provider Notes (Addendum)
 Care assumed of patient from outgoing provider.  See their note for initial history, exam and plan.  Clinical Course as of 03/31/24 1832  Tue Mar 31, 2024  1508 Chemo for met rectal cancer - hypotension, generalized weakness, sob.  EKG with global ischemia picture. Trop 68, 23. CTA penidng. Treated for sepsis - give IVF and abx. []  CTA pending. Plan to admit.  [SM]    Clinical Course User Index [SM] Viviano Ground, MD   Second lactic acid downtrending at 2.4.  Patient was treated for sepsis of unknown origin.  CTA with no signs of pulmonary embolism.  Troponin downtrending.  Did have an elevated BNP.  CTA showed progression of her disease with increased size of right infrahilar mass.  Consulted hospitalist for admission  Viviano Ground, MD 03/31/24 1558    Viviano Ground, MD 03/31/24 [phone]

## 2024-04-01 ENCOUNTER — Ambulatory Visit

## 2024-04-01 ENCOUNTER — Other Ambulatory Visit

## 2024-04-01 ENCOUNTER — Inpatient Hospital Stay

## 2024-04-01 ENCOUNTER — Inpatient Hospital Stay: Admitting: Oncology

## 2024-04-01 ENCOUNTER — Ambulatory Visit: Admitting: Oncology

## 2024-04-01 DIAGNOSIS — R6520 Severe sepsis without septic shock: Secondary | ICD-10-CM | POA: Diagnosis not present

## 2024-04-01 DIAGNOSIS — Z515 Encounter for palliative care: Secondary | ICD-10-CM

## 2024-04-01 DIAGNOSIS — A419 Sepsis, unspecified organism: Secondary | ICD-10-CM | POA: Diagnosis not present

## 2024-04-01 LAB — GASTROINTESTINAL PANEL BY PCR, STOOL (REPLACES STOOL CULTURE)

## 2024-04-01 LAB — BASIC METABOLIC PANEL WITH GFR
Anion gap: 10 (ref 5–15)
BUN: 14 mg/dL (ref 8–23)
CO2: 22 mmol/L (ref 22–32)
Calcium: 7.9 mg/dL — ABNORMAL LOW (ref 8.9–10.3)
Chloride: 104 mmol/L (ref 98–111)
Creatinine, Ser: 0.6 mg/dL (ref 0.44–1.00)
GFR, Estimated: >60 mL/min (ref >60)
Glucose, Bld: 80 mg/dL (ref 70–99)
Potassium: 3.2 mmol/L — ABNORMAL LOW (ref 3.5–5.1)
Sodium: 136 mmol/L (ref 135–145)

## 2024-04-01 LAB — URINALYSIS, W/ REFLEX TO CULTURE (INFECTION SUSPECTED)
Bilirubin Urine: NEGATIVE
Glucose, UA: NEGATIVE mg/dL
Ketones, ur: 5 mg/dL — AB
Nitrite: NEGATIVE
Protein, ur: NEGATIVE mg/dL
Specific Gravity, Urine: 1.015 (ref 1.005–1.030)
WBC, UA: >50 WBC/hpf (ref 0–5)
pH: 5 (ref 5.0–8.0)

## 2024-04-01 LAB — CBC
HCT: 27.6 % — ABNORMAL LOW (ref 36.0–46.0)
Hemoglobin: 8.6 g/dL — ABNORMAL LOW (ref 12.0–15.0)
MCH: 29 pg (ref 26.0–34.0)
MCHC: 31.2 g/dL (ref 30.0–36.0)
MCV: 92.9 fL (ref 80.0–100.0)
Platelets: 151 10*3/uL (ref 150–400)
RBC: 2.97 MIL/uL — ABNORMAL LOW (ref 3.87–5.11)
RDW: 16.3 % — ABNORMAL HIGH (ref 11.5–15.5)
WBC: 8.3 10*3/uL (ref 4.0–10.5)
nRBC: 0 % (ref 0.0–0.2)

## 2024-04-01 LAB — MRSA NEXT GEN BY PCR, NASAL: MRSA by PCR Next Gen: NOT DETECTED

## 2024-04-01 MED ORDER — DIPHENOXYLATE-ATROPINE 2.5-0.025 MG PO TABS
1.0000 | ORAL_TABLET | Freq: Four times a day (QID) | ORAL | Status: DC | PRN
  Administered 2024-04-01: 2 via ORAL
  Administered 2024-04-01: 1 via ORAL
  Administered 2024-04-02 (×2): 2 via ORAL
  Administered 2024-04-02: 1 via ORAL
  Filled 2024-04-01: qty 1
  Filled 2024-04-01 (×3): qty 2
  Filled 2024-04-01: qty 1

## 2024-04-01 MED ORDER — ADULT MULTIVITAMIN W/MINERALS CH
1.0000 | ORAL_TABLET | Freq: Every day | ORAL | Status: DC
  Administered 2024-04-01 – 2024-04-03 (×3): 1 via ORAL
  Filled 2024-04-01 (×3): qty 1

## 2024-04-01 MED ORDER — POTASSIUM CHLORIDE CRYS ER 20 MEQ PO TBCR
40.0000 meq | EXTENDED_RELEASE_TABLET | Freq: Once | ORAL | Status: AC
  Administered 2024-04-01: 40 meq via ORAL
  Filled 2024-04-01: qty 2

## 2024-04-01 MED ORDER — ENSURE ENLIVE PO LIQD
237.0000 mL | Freq: Three times a day (TID) | ORAL | Status: DC
  Administered 2024-04-01 – 2024-04-03 (×6): 237 mL via ORAL

## 2024-04-01 MED ORDER — SODIUM CHLORIDE 0.9 % IV SOLN: INTRAVENOUS | Status: DC

## 2024-04-01 NOTE — Progress Notes (Signed)
 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  GOAL:   Patient will meet greater than or equal to 90% of their needs  MONITOR:   PO intake, Supplement acceptance  REASON FOR ASSESSMENT:   Consult Assessment of nutrition requirement/status  ASSESSMENT:   Pt with history of hyperlipidemia, atrial fibrillation on Eliquis , metastatic rectal cancer, follicular lymphoma, presents for chief concerns of hypotension.  Pt admitted with severe sepsis and acute organ dysfunction  Reviewed I/O's: +2.5 L x 24 hours  Pt with stage IV cancer center, currently on folfiri. Remeron  started PTA due to lack of appetite.   Spoke with pt, daughter, and son at bedside. Pt pleasant and in good spirits today, reports feeling better and happy to eat. Pt reports she feels somewhat early, as she ate very little yesterday (a half a bag of chips while in the ED). Pt consumed a half piece of toast and jelly during visit without difficulty.   Per daughter, pt's intake has been very minimal over the past few months, as pt has very little appetite and desire to eat. Pt family offers her whatever she wants to eat, but often just consumes bites and sips. Pt follows with RD at [facility]; RD reviewed recommendations from last visit to update on progress. Pt consumes Boost supplements sometimes, but prefers Ensure, which it often  takes her an entire day to drink. They have considered Magic Cups, but wary of purchasing due to expense and worry that pt may not like them. RD offered to put on meal trays here to see if pt will like; family appreciative and agreeable to plan.   Reviewed wt hx; pt has experienced a 9.4% wt loss over the past 2 months, which is significant for time frame.   Discussed importance of good meal and supplement intake to promote healing. Encouraged liberalized diet and gave family permission to bring outside foods. Daughter brought in chick fil a and pt refused Hashbrowns during visit.   Medications reviewed and include neurontin  and remeron .   Labs reviewed: K: 3.2, CBGS: 82 (inpatient orders for glycemic control are none).    NUTRITION - FOCUSED PHYSICAL EXAM:  Flowsheet Row Most Recent Value  Orbital Region Moderate depletion  Upper Arm Region Severe depletion  Thoracic and Lumbar Region Severe depletion  Buccal Region Moderate depletion  Temple Region Severe depletion  Clavicle Bone Region Severe depletion  Clavicle and Acromion Bone Region Severe depletion  Scapular Bone Region Severe depletion  Dorsal Hand Severe depletion  Patellar Region Severe depletion  Anterior Thigh Region Severe depletion  Posterior Calf Region Severe depletion  Edema (RD Assessment) None  Hair Reviewed  Eyes Reviewed  Mouth Reviewed  Skin Reviewed  Nails Reviewed       Diet Order:   Diet Order             Diet regular Room service appropriate? Yes; Fluid consistency: Thin  Diet effective now  EDUCATION NEEDS:   Education needs have been addressed  Skin:  Skin Assessment: Reviewed RN Assessment  Last BM:  03/31/24 (type 7)  Height:   Ht Readings from Last 1 Encounters:  03/31/24 4\' 10"  (1.473 m)    Weight:   Wt Readings from Last 1 Encounters:  03/31/24 36.5 kg    Ideal Body Weight:  43.9 kg  BMI:  Body mass index is 16.82 kg/m.  Estimated Nutritional  Needs:   Kcal:  1450-1650  Protein:  60-75 grams  Fluid:  1.4-1.6 L    Herschel Lords, RD, LDN, CDCES Registered Dietitian III Certified Diabetes Care and Education Specialist If unable to reach this RD, please use "RD Inpatient" group chat on secure chat between hours of 8am-4 pm daily

## 2024-04-01 NOTE — Consult Note (Signed)
 Pall[facility] at [facility] [phone] [phone]   Name: Veronica Boyd Date: 04/01/2024 MRN: [id number]  DOB: October 20, 1946  Patient Care Team: Antonio Baumgarten, MD as PCP - General (Internal Medicine) Devorah Fonder, MD as PCP - Cardiology (Cardiology) Ardeen Kohler, MD as PCP - Electrophysiology (Cardiology) Shellie Dials, MD as Consulting Physician (Oncology) Rochell Chroman, RN as Oncology Nurse Navigator Marc Senior, MD as Consulting Physician (Pulmonary Disease)    REASON FOR CONSULTATION: Veronica Boyd is a 78 y.o. female with multiple medical problems including stage IV rectal cancer, history of follicular lymphoma.  Patient is status post chemotherapy and RT.  Plan was for surgical evaluation but patient was found to have disease progression with pulmonary nodules.  Hospice was discussed but patient ultimately opted to pursue further treatment.  Most recently, patient has been on treatment with FOLFIRI.  Patient was admitted to hospital with presumed sepsis of unclear source.  Palliative care consulted to address goals.  SOCIAL HISTORY:     reports that she has never smoked. She has never used smokeless tobacco. She reports that she does not drink alcohol and does not use drugs.  Patient lives at home with her daughter.  ADVANCE DIRECTIVES:  Not on file  CODE STATUS: DNR/DNI  PAST MEDICAL HISTORY: Past Medical History:  Diagnosis Date   Anemia due to antineoplastic chemotherapy    Aortic atherosclerosis (HCC)    Arthritis    Atrial fibrillation and flutter (HCC)    a.) 04/2023 Zio: Monitor 1 - 16 % aflutter burden w/ avg rate of 168, (78-226), possible Atach. Freq PACs, rare PVCs. Monitor 2 - 15% afib/flutter burden @ 115 [phone]). Poss Atach. 4 beats NSVT. Freq PACs, rare PVCs; b.)  CHA2DS2-VASc = 4 (age x2, sex, vascular disease history); c.) cardiac rate/rhythm maintained on  oral metoprolol  succinate; chronically anticoagulated using apixaban    Bronchial obstruction    Chemotherapy-induced neuropathy (HCC)    Chicken pox    Colon polyp    Coronary artery calcification seen on CT scan    a. 11/2022 CT Chest: Coronary artery calcification and aortic atherosclerosis.   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 04/2023 Echo: EF >55%, nl RV size/fxn.  No significant valvular disease observed.   Hyperlipidemia    Mass of right chest wall    Multiple lung nodules 09/2022   On apixaban  therapy    Osteoporosis    Pectus excavatum    Protein-calorie malnutrition, severe (HCC)    Rectal carcinoma (HCC)    a.) recurrent stage IVA (cT4a, cN2a, cM1a)   Thrombocytopenia (HCC)     PAST SURGICAL HISTORY:  Past Surgical History:  Procedure Laterality Date   AXILLARY LYMPH NODE DISSECTION Right 08/21/2016   Procedure: AXILLARY LYMPH NODE excision;  Surgeon: Benancio Bracket, MD;  Location: [facility] ORS;  Service: General;  Laterality: Right;   CATARACT EXTRACTION, BILATERAL Bilateral    COLONOSCOPY N/A 09/19/2020   Procedure: COLONOSCOPY;  Surgeon: Shane Darling, MD;  Location: [facility] ENDOSCOPY;  Service: Endoscopy;  Laterality: N/A;   ENDOBRONCHIAL ULTRASOUND Left 08/05/2023   Procedure: ENDOBRONCHIAL ULTRASOUND;  Surgeon: Marc Senior, MD;  Location: [facility] ORS;  Service: Pulmonary;  Laterality: Left;   PORTA CATH INSERTION N/A 09/16/2017   Procedure:  PORTA CATH INSERTION;  Surgeon: Celso College, MD;  Location: [facility] INVASIVE CV LAB;  Service: Cardiovascular;  Laterality: N/A;   TONSILLECTOMY     TOTAL HIP ARTHROPLASTY Left 1992    HEMATOLOGY/ONCOLOGY HISTORY:  Oncology History Overview Note  Initial diagnosis 2014. S/p radiation 3 years ago.  Noted increase lymphadenopathy and biopsy was recommended.   Unfortunately she refused and was lost to follow-up. Return to clinic in September 2017 with increased swelling surrounding her right collarbone.  Had biopsy that was suggestive of follicular lymphoma but not definitive.  Had PET scan to complete the staging work-up.  PET scan showed hypermetabolic adenopathy in bilateral subpectoral and axillary regions, right internal mammary chain and right paratracheal and subclavicular regions.  There is no evidence of hypermetabolic lymphadenopathy within the abdomen or pelvis.  Received Zevalin  on July 30, 2017 with not much therapeutic effect.  She subsequently underwent cycles 5 of R CHOP chemotherapy with Neulasta  support completing on December 12, 2017.  No further intervention or treatments are needed at this time.  PET scan results from February 19, 2018 reviewed independently and reported as above with no suspicious finding for active lymphoma.      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)  07/24/2016 Initial Diagnosis   Follicular lymphoma (HCC)   Rectal cancer (HCC)  11/02/2020 Initial Diagnosis   Rectal cancer (HCC)   11/02/2020 Cancer Staging   Staging form: Colon and Rectum, AJCC 8th Edition - Clinical: Stage IVA Veronica Boyd, cM1a) - Signed by Shellie Dials, MD on 11/02/2020   11/16/2020 - 03/31/2021 Chemotherapy         04/24/2021 - 05/26/2021 Chemotherapy   Patient is on Treatment Plan : RECTAL 5FU IVCI D1-5 + XRT     04/10/2023 - 10/18/2023 Chemotherapy   Patient is on Treatment Plan : COLORECTAL FOLFOX + Bevacizumab  q14d     02/26/2024 -  Chemotherapy   Patient is on Treatment Plan : COLORECTAL FOLFIRI q21d       ALLERGIES:  has no known allergies.  MEDICATIONS:  Current Facility-Administered Medications  Medication Dose Route Frequency Provider Last Rate Last Admin   0.9 %  sodium chloride  infusion   Intravenous Continuous Alphonsus Jeans, MD 75 mL/hr at 04/01/24 1208 New Bag at 04/01/24  1208   acetaminophen  (TYLENOL ) tablet 650 mg  650 mg Oral Q6H PRN Cox, Amy N, DO   650 mg at 03/31/24 2321   Or   acetaminophen  (TYLENOL ) suppository 650 mg  650 mg Rectal Q6H PRN Cox, Amy N, DO       amiodarone  (PACERONE ) tablet 200 mg  200 mg Oral Daily Cox, Amy N, DO   200 mg at 04/01/24 9604   apixaban  (ELIQUIS ) tablet 2.5 mg  2.5 mg Oral BID Cox, Amy N, DO   2.5 mg at 04/01/24 5409   budesonide-glycopyrrolate -formoterol (BREZTRI) 160-9-4.8 MCG/ACT inhaler 2 puff  2 puff Inhalation BID Cox, Amy N, DO   2 puff at 04/01/24 0926   ceFEPIme  (MAXIPIME ) 2 g in sodium chloride  0.9 % 100 mL IVPB  2 g Intravenous Q12H Nazari, Walid A, RPH 200 mL/hr at 04/01/24 1250 2 g at 04/01/24 1250   Chlorhexidine  Gluconate Cloth 2 % PADS 6 each  6 each Topical Daily Cox, Amy N, DO   6 each at 04/01/24 1130   chlorpheniramine-HYDROcodone  (TUSSIONEX) 10-8 MG/5ML suspension 5 mL  5 mL Oral Q12H PRN Cox, Amy N, DO  diphenoxylate -atropine  (LOMOTIL ) 2.5-0.025 MG per tablet 1-2 tablet  1-2 tablet Oral QID PRN Anysha Frappier R, NP       feeding supplement (ENSURE ENLIVE / ENSURE PLUS) liquid 237 mL  237 mL Oral TID BM Alphonsus Jeans, MD   237 mL at 04/01/24 1248   gabapentin  (NEURONTIN ) capsule 300 mg  300 mg Oral BID Cox, Amy N, DO   300 mg at 04/01/24 8119   HYDROcodone -acetaminophen  (NORCO/VICODIN) 5-325 MG per tablet 1 tablet  1 tablet Oral Q6H PRN Cox, Amy N, DO       levothyroxine  (SYNTHROID ) tablet 25 mcg  25 mcg Oral Q0600 Cox, Amy N, DO   25 mcg at 04/01/24 1478   melatonin tablet 5 mg  5 mg Oral QHS PRN Cox, Amy N, DO   5 mg at 03/31/24 2321   metroNIDAZOLE  (FLAGYL ) IVPB 500 mg  500 mg Intravenous Q12H Cox, Amy N, DO 100 mL/hr at 04/01/24 1419 500 mg at 04/01/24 1419   mirtazapine  (REMERON ) tablet 15 mg  15 mg Oral QHS Cox, Amy N, DO   15 mg at 03/31/24 2321   multivitamin with minerals tablet 1 tablet  1 tablet Oral Daily Alphonsus Jeans, MD   1 tablet at 04/01/24 2956   ondansetron  (ZOFRAN )  tablet 4 mg  4 mg Oral Q6H PRN Cox, Amy N, DO       Or   ondansetron  (ZOFRAN ) injection 4 mg  4 mg Intravenous Q6H PRN Cox, Amy N, DO       senna-docusate (Senokot-S) tablet 1 tablet  1 tablet Oral QHS PRN Cox, Amy N, DO       simvastatin  (ZOCOR ) tablet 20 mg  20 mg Oral QHS Cox, Amy N, DO   20 mg at 03/31/24 2321   sodium chloride  flush (NS) 0.9 % injection 10-40 mL  10-40 mL Intracatheter Q12H Cox, Amy N, DO   10 mL at 04/01/24 2130   sodium chloride  flush (NS) 0.9 % injection 10-40 mL  10-40 mL Intracatheter PRN Cox, Amy N, DO       [START ON 04/02/2024] vancomycin  (VANCOREADY) IVPB 750 mg/150 mL  750 mg Intravenous Q48H Nazari, Walid A, RPH       Facility-Administered Medications Ordered in Other Encounters  Medication Dose Route Frequency Provider Last Rate Last Admin   heparin  lock flush 100 unit/mL  500 Units Intravenous Once Finnegan, Timothy J, MD       heparin  lock flush 100 unit/mL  500 Units Intracatheter PRN Adrian Alba, Deadra Everts, MD       heparin  lock flush 100 unit/mL  500 Units Intravenous Once Finnegan, Timothy J, MD       heparin  lock flush 100 unit/mL  500 Units Intravenous Once Finnegan, Timothy J, MD       sodium chloride  flush (NS) 0.9 % injection 10 mL  10 mL Intravenous PRN Finnegan, Timothy J, MD   10 mL at 12/04/17 0901   sodium chloride  flush (NS) 0.9 % injection 10 mL  10 mL Intracatheter PRN Shellie Dials, MD       sodium chloride  flush (NS) 0.9 % injection 10 mL  10 mL Intravenous PRN Finnegan, Timothy J, MD   10 mL at 04/24/21 8657   yttrium-90 injection 22.3 millicurie  22.3 millicurie Intravenous Once Chrystal, Allison Ivory, MD        VITAL SIGNS: BP (!) 91/50 (BP Location: Left Arm)   Pulse 83   Temp 98.6 F (37 C)  Resp 18   Ht 4\' 10"  (1.473 m)   Wt 80 lb 7.5 oz (36.5 kg)   SpO2 99%   BMI 16.82 kg/m  Filed Weights   03/31/24 1944  Weight: 80 lb 7.5 oz (36.5 kg)    Estimated body mass index is 16.82 kg/m as calculated from the following:   Height  as of this encounter: 4\' 10"  (1.473 m).   Weight as of this encounter: 80 lb 7.5 oz (36.5 kg).  LABS: CBC:    Component Value Date/Time   WBC 8.3 04/01/2024 0248   HGB 8.6 (L) 04/01/2024 0248   HGB 10.7 (L) 03/25/2024 0913   HGB 13.7 09/28/2013 1538   HCT 27.6 (L) 04/01/2024 0248   HCT 41.0 09/28/2013 1538   PLT 151 04/01/2024 0248   PLT 344 03/25/2024 0913   PLT 229 09/28/2013 1538   MCV 92.9 04/01/2024 0248   MCV 93 09/28/2013 1538   NEUTROABS 9.8 (H) 03/31/2024 1128   NEUTROABS 5.3 09/28/2013 1538   LYMPHSABS 0.9 03/31/2024 1128   LYMPHSABS 1.8 09/28/2013 1538   MONOABS 0.9 03/31/2024 1128   MONOABS 0.9 09/28/2013 1538   EOSABS 0.0 03/31/2024 1128   EOSABS 0.1 09/28/2013 1538   BASOSABS 0.0 03/31/2024 1128   BASOSABS 0.0 09/28/2013 1538   Comprehensive Metabolic Panel:    Component Value Date/Time   NA 136 04/01/2024 0248   K 3.2 (L) 04/01/2024 0248   CL 104 04/01/2024 0248   CO2 22 04/01/2024 0248   BUN 14 04/01/2024 0248   CREATININE 0.60 04/01/2024 0248   CREATININE 0.71 03/25/2024 0913   GLUCOSE 80 04/01/2024 0248   CALCIUM  7.9 (L) 04/01/2024 0248   AST 17 03/31/2024 1329   AST 22 03/25/2024 0913   ALT 14 03/31/2024 1329   ALT 17 03/25/2024 0913   ALKPHOS 75 03/31/2024 1329   BILITOT 0.8 03/31/2024 1329   BILITOT 0.4 03/25/2024 0913   PROT 5.9 (L) 03/31/2024 1329   ALBUMIN 3.0 (L) 03/31/2024 1329    RADIOGRAPHIC STUDIES: CT Angio Chest PE W/Cm &/Or Wo Cm Result Date: 03/31/2024 CLINICAL DATA:  Metastatic rectal cancer, lymphoma presenting with hypotension, increasing weakness, and shortness of breath on exertion EXAM: CT ANGIOGRAPHY CHEST WITH CONTRAST TECHNIQUE: Multidetector CT imaging of the chest was performed using the standard protocol during bolus administration of intravenous contrast. Multiplanar CT image reconstructions and MIPs were obtained to evaluate the vascular anatomy. RADIATION DOSE REDUCTION: This exam was performed according to the  departmental dose-optimization program which includes automated exposure control, adjustment of the mA and/or kV according to patient size and/or use of iterative reconstruction technique. CONTRAST:  75mL OMNIPAQUE  IOHEXOL  350 MG/ML SOLN COMPARISON:  Radiograph 03/31/2024 and CT 02/12/2024 FINDINGS: Cardiovascular: No pericardial effusion. Normal caliber thoracic aorta without evidence of dissection. Coronary artery and aortic atherosclerotic calcification. Negative for acute pulmonary embolism. Right chest wall Port-A-Cath tip at the superior cavoatrial junction. Mediastinum/Nodes: No mediastinal lymphadenopathy. Trachea and esophagus are unremarkable. Lungs/Pleura: No pleural effusion or pneumothorax. Slightly increased size of the infrahilar mass in the right lower lobe measuring 4.8 x 5.2 cm, previously 4.8 x 5.0 cm. Obstruction of the right lower lobe bronchi with near complete atelectasis of the right lower lobe is not substantially changed from 02/12/2024. Numerous bilateral pulmonary nodules are redemonstrated. The largest on the left is in the left lower lobe and measures 2.9 x 4.0 cm on series 6/image 79, previously 2.9 x 3.7 cm. The largest on the right is in the anterior  right upper lobe and measures 2.4 x 1.3 cm, similar to 02/12/2024. The previous ground-glass opacities have nearly resolved. Upper Abdomen: No acute abnormality. Musculoskeletal: No acute fracture or destructive osseous lesion. Review of the MIP images confirms the above findings. IMPRESSION: 1. Negative for acute pulmonary embolism. 2. Progression of disease with increased size of the right infrahilar mass and dominant left lower lobe nodule. 3. Aortic Atherosclerosis (ICD10-I70.0). Electronically Signed   By: Rozell Cornet M.D.   On: 03/31/2024 17:55   DG Chest Portable 1 View Result Date: 03/31/2024 CLINICAL DATA:  SOB EXAM: PORTABLE CHEST - 1 VIEW COMPARISON:  08/05/2023 FINDINGS: Increase in size and number bibasilar pulmonary  nodules. Stable right IJ port catheter. Heart size and mediastinal contours are within normal limits. Aortic Atherosclerosis (ICD10-170.0). No effusion. Visualized bones unremarkable. IMPRESSION: Increase in size and number of bibasilar pulmonary nodules. Electronically Signed   By: Nicoletta Barrier M.D.   On: 03/31/2024 13:37    PERFORMANCE STATUS (ECOG) : 3 - Symptomatic, >50% confined to bed  Review of Systems Unless otherwise noted, a complete review of systems is negative.  Physical Exam General: Thin, frail-appearing Pulmonary: Unlabored Extremities: no edema, no joint deformities Skin: no rashes Neurological: Weakness but otherwise nonfocal  IMPRESSION: Patient with stage IV colorectal cancer, most recently on treatment with FOLFIRI, now with hospitalized secondary to presumed sepsis.  Met with patient and daughter to discuss goals.  Patient has poorly tolerated chemotherapy, which likely has contributed to her recent hospitalization.  Chemotherapy is also causing significant side effects including weakness, poor oral intake, and diarrhea.  Hospice has been previously discussed as alternative treatment options are limited.  Patient now states that she is "ready to die" and would like to forego further chemotherapy and pursue hospice at home.  Hospice was discussed in detail.  Daughter is also in agreement with plan.  Symptomatically, she currently has diarrhea.  Stool studies and C. difficile PCR were negative.  Will start on antidiarrheals as this is most likely secondary to chemotherapy.  PLAN: - Best supportive care - Referral to hospice - Start as needed Lomotil  (scheduled dosing if needed) - Outpatient follow-up as needed  Case and plan discussed with Dr. Adrian Alba   Time Total: 30 minutes  Visit consisted of counseling and education dealing with the complex and emotionally intense issues of symptom management and palliative care in the setting of serious and potentially  life-threatening illness.Greater than 50%  of this time was spent counseling and coordinating care related to the above assessment and plan.  Signed by: Gerilyn Kobus, PhD, NP-C

## 2024-04-01 NOTE — Progress Notes (Signed)
 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 Diann Forth note on how pt met severe sepsis criteria. UA w/ reflex to cx ordered but not collected yet. Blood cxs NGTD. No pneumonia seen on CXR. Continue on IV cefepime , flagyl , vanco. If no source found by tomorrow, will d/c abxs and monitor    Diarrhea: likely secondary to recent chemo. GI PCR panel & c. diff are both neg   Hypokalemia: potassium given    Chronic pain: likely secondary to cancer. Continue on home dose of norco.    Hypothyroidism: continue on home dose of levothyroxine     PAF: continue on home dose of amio, eliquis     Severe protein calorie malnutrition: continue on oral supplements    HLD: continue on statin    Lymphoma: in remission as per pt. F/u w/ onco outpatient   Rectal Cancer: stage IV. Received chemo approx 1 week ago. Palliative care consulted as per onco     DVT prophylaxis: eliquis  Code Status: DNR Family Communication: discussed pt's care w/ pt's family at bedside and answered their questions  Disposition Plan: unclear.  Level of care: Progressive  Status is: Inpatient Remains inpatient appropriate because: severity of illness,    Consultants:  Onco   Procedures:  Antimicrobials: cefepime , flagyl , vanco    Subjective: Pt c/o generalized weakness  Objective: Vitals:   04/01/24 0000 04/01/24 0010 04/01/24 0100 04/01/24 0356  BP:  (!)  108/54  (!) 109/56  Pulse:  89    Resp: (!) 25 17 (!) 21 20  Temp:  98.2 F (36.8 C)  97.9 F (36.6 C)  TempSrc:  Oral  Axillary  SpO2:  97%  97%  Weight:      Height:        Intake/Output Summary (Last 24 hours) at 04/01/2024 0823 Last data filed at 04/01/2024 0409 Gross per 24 hour  Intake 2450 ml  Output --  Net 2450 ml   Filed Weights   03/31/24 1944  Weight: 36.5 kg    Examination:  General exam: Appears calm but uncomfortable. Cachetic appearing  Respiratory system: decreased breath sounds b/l  Cardiovascular system: S1 & S2 +. No rubs, gallops or clicks.  Gastrointestinal system: Abdomen is nondistended, soft and nontender. Normal bowel sounds heard. Central nervous system: Alert and oriented. Moves all extremities  Psychiatry: Judgement and insight appear normal. Flat mood and affect    Data Reviewed: I have personally reviewed following labs and imaging studies  CBC: Recent Labs  Lab 03/25/24 0913 03/31/24 1128 04/01/24 0248  WBC 13.3* 11.7* 8.3  NEUTROABS 9.7* 9.8*  --   HGB 10.7* 11.8* 8.6*  HCT 34.2* 37.5 27.6*  MCV 93.2 93.8 92.9  PLT 344 172 151   Basic Metabolic Panel: Recent Labs  Lab 03/25/24 0913 03/31/24 1329 04/01/24 0248  NA 137 137 136  K 3.5 4.0 3.2*  CL 104 104 104  CO2 25 23 22   GLUCOSE 130*  87 80  BUN 22 19 14   CREATININE 0.71 0.70 0.60  CALCIUM  8.3* 8.6* 7.9*  MG 1.9  --   --    GFR: Estimated Creatinine Clearance: 32.3 mL/min (by C-G formula based on SCr of 0.6 mg/dL). Liver Function Tests: Recent Labs  Lab 03/25/24 0913 03/31/24 1329  AST 22 17  ALT 17 14  ALKPHOS 60 75  BILITOT 0.4 0.8  PROT 6.2* 5.9*  ALBUMIN 2.9* 3.0*   No results for input(s): "LIPASE", "AMYLASE" in the last 168 hours. No results for input(s): "AMMONIA" in the last 168 hours. Coagulation Profile: No results for input(s): "INR", "PROTIME" in the last 168 hours. Cardiac Enzymes: No results for input(s): "CKTOTAL", "CKMB", "CKMBINDEX",  "TROPONINI" in the last 168 hours. BNP (last 3 results) No results for input(s): "PROBNP" in the last 8760 hours. HbA1C: No results for input(s): "HGBA1C" in the last 72 hours. CBG: No results for input(s): "GLUCAP" in the last 168 hours. Lipid Profile: No results for input(s): "CHOL", "HDL", "LDLCALC", "TRIG", "CHOLHDL", "LDLDIRECT" in the last 72 hours. Thyroid  Function Tests: No results for input(s): "TSH", "T4TOTAL", "FREET4", "T3FREE", "THYROIDAB" in the last 72 hours. Anemia Panel: No results for input(s): "VITAMINB12", "FOLATE", "FERRITIN", "TIBC", "IRON", "RETICCTPCT" in the last 72 hours. Sepsis Labs: Recent Labs  Lab 03/31/24 1128 03/31/24 1329  LATICACIDVEN 3.5* 2.4*    Recent Results (from the past 240 hours)  Blood culture (routine x 2)     Status: None (Preliminary result)   Collection Time: 03/31/24 11:28 AM   Specimen: BLOOD  Result Value Ref Range Status   Specimen Description BLOOD LEFT ANTECUBITAL  Final   Special Requests   Final    BOTTLES DRAWN AEROBIC AND ANAEROBIC Blood Culture results may not be optimal due to an inadequate volume of blood received in culture bottles   Culture   Final    NO GROWTH < 24 HOURS Performed at [facility], [address] [place], [place] [address]    Report Status PENDING  Incomplete  Resp panel by RT-PCR (RSV, Flu A&B, Covid) Anterior Nasal Swab     Status: None   Collection Time: 03/31/24 11:57 AM   Specimen: Anterior Nasal Swab  Result Value Ref Range Status   SARS Coronavirus 2 by RT PCR NEGATIVE NEGATIVE Final    Comment: (NOTE) SARS-CoV-2 target nucleic acids are NOT DETECTED.  The SARS-CoV-2 RNA is generally detectable in upper respiratory specimens during the acute phase of infection. The lowest concentration of SARS-CoV-2 viral copies this assay can detect is 138 copies/mL. A negative result does not preclude SARS-Cov-2 infection and should not be used as the sole basis for treatment or other  patient management decisions. A negative result may occur with  improper specimen collection/handling, submission of specimen other than nasopharyngeal swab, presence of viral mutation(s) within the areas targeted by this assay, and inadequate number of viral copies(<138 copies/mL). A negative result must be combined with clinical observations, patient history, and epidemiological information. The expected result is Negative.  Fact Sheet for Patients:  BloggerCourse.com  Fact Sheet for Healthcare Providers:  SeriousBroker.it  This test is no t yet approved or cleared by the [place]  FDA and  has been authorized for detection and/or diagnosis of SARS-CoV-2 by FDA under an Emergency Use Authorization (EUA). This EUA will remain  in effect (meaning this test can be used) for the duration of the COVID-19 declaration under Section 564(b)(1) of the Act, 21 U.S.C.section 360bbb-3(b)(1), unless the authorization is  terminated  or revoked sooner.       Influenza A by PCR NEGATIVE NEGATIVE Final   Influenza B by PCR NEGATIVE NEGATIVE Final    Comment: (NOTE) The Xpert Xpress SARS-CoV-2/FLU/RSV plus assay is intended as an aid in the diagnosis of influenza from Nasopharyngeal swab specimens and should not be used as a sole basis for treatment. Nasal washings and aspirates are unacceptable for Xpert Xpress SARS-CoV-2/FLU/RSV testing.  Fact Sheet for Patients: BloggerCourse.com  Fact Sheet for Healthcare Providers: SeriousBroker.it  This test is not yet approved or cleared by the [place]  FDA and has been authorized for detection and/or diagnosis of SARS-CoV-2 by FDA under an Emergency Use Authorization (EUA). This EUA will remain in effect (meaning this test can be used) for the duration of the COVID-19 declaration under Section 564(b)(1) of the Act, 21 U.S.C. section  360bbb-3(b)(1), unless the authorization is terminated or revoked.     Resp Syncytial Virus by PCR NEGATIVE NEGATIVE Final    Comment: (NOTE) Fact Sheet for Patients: BloggerCourse.com  Fact Sheet for Healthcare Providers: SeriousBroker.it  This test is not yet approved or cleared by the [place]  FDA and has been authorized for detection and/or diagnosis of SARS-CoV-2 by FDA under an Emergency Use Authorization (EUA). This EUA will remain in effect (meaning this test can be used) for the duration of the COVID-19 declaration under Section 564(b)(1) of the Act, 21 U.S.C. section 360bbb-3(b)(1), unless the authorization is terminated or revoked.  Performed at [facility], [address] [place], [place] [address]   Blood culture (routine x 2)     Status: None (Preliminary result)   Collection Time: 03/31/24  1:29 PM   Specimen: BLOOD  Result Value Ref Range Status   Specimen Description BLOOD BLOOD RIGHT WRIST  Final   Special Requests   Final    BOTTLES DRAWN AEROBIC ONLY Blood Culture results may not be optimal due to an inadequate volume of blood received in culture bottles   Culture   Final    NO GROWTH < 24 HOURS Performed at [facility], [address] [place], [place] [address]    Report Status PENDING  Incomplete  Gastrointestinal Panel by PCR , Stool     Status: None   Collection Time: 03/31/24  9:31 PM   Specimen: Stool  Result Value Ref Range Status   Campylobacter species NOT DETECTED NOT DETECTED Final   Plesimonas shigelloides NOT DETECTED NOT DETECTED Final   Salmonella species NOT DETECTED NOT DETECTED Final   Yersinia enterocolitica NOT DETECTED NOT DETECTED Final   Vibrio species NOT DETECTED NOT DETECTED Final   Vibrio cholerae NOT DETECTED NOT DETECTED Final   Enteroaggregative E coli (EAEC) NOT DETECTED NOT DETECTED Final   Enteropathogenic E coli (EPEC) NOT DETECTED NOT  DETECTED Final   Enterotoxigenic E coli (ETEC) NOT DETECTED NOT DETECTED Final   Shiga like toxin producing E coli (STEC) NOT DETECTED NOT DETECTED Final   Shigella/Enteroinvasive E coli (EIEC) NOT DETECTED NOT DETECTED Final   Cryptosporidium NOT DETECTED NOT DETECTED Final   Cyclospora cayetanensis NOT DETECTED NOT DETECTED Final   Entamoeba histolytica NOT DETECTED NOT DETECTED Final   Giardia lamblia NOT DETECTED NOT DETECTED Final   Adenovirus F40/41 NOT DETECTED NOT DETECTED Final   Astrovirus NOT DETECTED NOT DETECTED Final   Norovirus GI/GII NOT DETECTED NOT DETECTED Final   Rotavirus A NOT DETECTED NOT DETECTED Final   Sapovirus (I, II, IV, and V) NOT DETECTED NOT  DETECTED Final    Comment: Performed at [facility], [address] [place], [place] [address]  C Difficile Quick Screen w PCR reflex     Status: None   Collection Time: 03/31/24  9:31 PM   Specimen: STOOL  Result Value Ref Range Status   C Diff antigen NEGATIVE NEGATIVE Final   C Diff toxin NEGATIVE NEGATIVE Final   C Diff interpretation No C. difficile detected.  Final    Comment: Performed at [facility], [address] [place], [place] [address]  MRSA Next Gen by PCR, Nasal     Status: None   Collection Time: 03/31/24  9:34 PM   Specimen: STOOL; Nasal Swab  Result Value Ref Range Status   MRSA by PCR Next Gen NOT DETECTED NOT DETECTED Final    Comment: (NOTE) The GeneXpert MRSA Assay (FDA approved for NASAL specimens only), is one component of a comprehensive MRSA colonization surveillance program. It is not intended to diagnose MRSA infection nor to guide or monitor treatment for MRSA infections. Test performance is not FDA approved in patients less than 5 years old. Performed at [facility], [address] [place], [place] [address]          Radiology Studies: CT Angio Chest PE W/Cm &/Or Wo Cm Result Date: 03/31/2024 CLINICAL DATA:  Metastatic rectal cancer,  lymphoma presenting with hypotension, increasing weakness, and shortness of breath on exertion EXAM: CT ANGIOGRAPHY CHEST WITH CONTRAST TECHNIQUE: Multidetector CT imaging of the chest was performed using the standard protocol during bolus administration of intravenous contrast. Multiplanar CT image reconstructions and MIPs were obtained to evaluate the vascular anatomy. RADIATION DOSE REDUCTION: This exam was performed according to the departmental dose-optimization program which includes automated exposure control, adjustment of the mA and/or kV according to patient size and/or use of iterative reconstruction technique. CONTRAST:  75mL OMNIPAQUE  IOHEXOL  350 MG/ML SOLN COMPARISON:  Radiograph 03/31/2024 and CT 02/12/2024 FINDINGS: Cardiovascular: No pericardial effusion. Normal caliber thoracic aorta without evidence of dissection. Coronary artery and aortic atherosclerotic calcification. Negative for acute pulmonary embolism. Right chest wall Port-A-Cath tip at the superior cavoatrial junction. Mediastinum/Nodes: No mediastinal lymphadenopathy. Trachea and esophagus are unremarkable. Lungs/Pleura: No pleural effusion or pneumothorax. Slightly increased size of the infrahilar mass in the right lower lobe measuring 4.8 x 5.2 cm, previously 4.8 x 5.0 cm. Obstruction of the right lower lobe bronchi with near complete atelectasis of the right lower lobe is not substantially changed from 02/12/2024. Numerous bilateral pulmonary nodules are redemonstrated. The largest on the left is in the left lower lobe and measures 2.9 x 4.0 cm on series 6/image 79, previously 2.9 x 3.7 cm. The largest on the right is in the anterior right upper lobe and measures 2.4 x 1.3 cm, similar to 02/12/2024. The previous ground-glass opacities have nearly resolved. Upper Abdomen: No acute abnormality. Musculoskeletal: No acute fracture or destructive osseous lesion. Review of the MIP images confirms the above findings. IMPRESSION: 1. Negative  for acute pulmonary embolism. 2. Progression of disease with increased size of the right infrahilar mass and dominant left lower lobe nodule. 3. Aortic Atherosclerosis (ICD10-I70.0). Electronically Signed   By: Rozell Cornet M.D.   On: 03/31/2024 17:55   DG Chest Portable 1 View Result Date: 03/31/2024 CLINICAL DATA:  SOB EXAM: PORTABLE CHEST - 1 VIEW COMPARISON:  08/05/2023 FINDINGS: Increase in size and number bibasilar pulmonary nodules. Stable right IJ port catheter. Heart size and mediastinal contours are within normal limits. Aortic Atherosclerosis (  ICD10-170.0). No effusion. Visualized bones unremarkable. IMPRESSION: Increase in size and number of bibasilar pulmonary nodules. Electronically Signed   By: Nicoletta Barrier M.D.   On: 03/31/2024 13:37        Scheduled Meds:  amiodarone   200 mg Oral Daily   apixaban   2.5 mg Oral BID   budesonide-glycopyrrolate -formoterol  2 puff Inhalation BID   Chlorhexidine  Gluconate Cloth  6 each Topical Daily   gabapentin   300 mg Oral BID   levothyroxine   25 mcg Oral Q0600   mirtazapine   15 mg Oral QHS   simvastatin   20 mg Oral QHS   sodium chloride  flush  10-40 mL Intracatheter Q12H   Continuous Infusions:  ceFEPime  (MAXIPIME ) IV 2 g (04/01/24 0214)   metronidazole  Stopped (04/01/24 0409)   [START ON 04/02/2024] vancomycin        LOS: 1 day       Alphonsus Jeans, MD [facility] Pager [phone]  If 7PM-7AM, please contact night-coverage [email or web address] Password [facility] 04/01/2024, 8:23 AM

## 2024-04-01 NOTE — Consult Note (Signed)
 [facility]  [phone] [phone]  ID: Starr Eddy OB: 05-14-1946  MR#: [id number]  AOZ#:[id number]  Patient Care Team: Antonio Baumgarten, MD as PCP - General (Internal Medicine) Devorah Fonder, MD as PCP - Cardiology (Cardiology) Ardeen Kohler, MD as PCP - Electrophysiology (Cardiology) Shellie Dials, MD as Consulting Physician (Oncology) Rochell Chroman, RN as Oncology Nurse Navigator Marc Senior, MD as Consulting Physician (Pulmonary Disease)  CHIEF COMPLAINT: Progressive stage IV rectal cancer.  Declining performance status with dehydration and diarrhea.  INTERVAL HISTORY: Patient is an 78 year old female who recently received chemotherapy with FOLFIRI approximately 1 week ago for progressive stage IV rectal cancer.  She has had poor appetite, significant diarrhea, and declining performance status.  She has no neurologic complaints.  She denies any fevers.  She does not complain of pain today.  She has no chest pain, shortness of breath, cough, or hemoptysis.  She denies any nausea, vomiting, or constipation.  She has no urinary complaints.  Patient offers no specific complaints today.  REVIEW OF SYSTEMS:   Review of Systems  Constitutional:  Positive for malaise/fatigue. Negative for fever and weight loss.  Respiratory: Negative.  Negative for cough, hemoptysis and shortness of breath.   Cardiovascular: Negative.  Negative for chest pain and leg swelling.  Gastrointestinal:  Positive for diarrhea.  Genitourinary: Negative.  Negative for dysuria.  Musculoskeletal:  Negative for back pain.  Skin: Negative.  Negative for rash.  Neurological:  Positive for weakness. Negative for dizziness, focal weakness and headaches.  Psychiatric/Behavioral: Negative.  The patient is not nervous/anxious.     As per HPI. Otherwise, a complete review of systems is negative.  PAST MEDICAL HISTORY: Past Medical History:  Diagnosis Date    Anemia due to antineoplastic chemotherapy    Aortic atherosclerosis (HCC)    Arthritis    Atrial fibrillation and flutter (HCC)    a.) 04/2023 Zio: Monitor 1 - 16 % aflutter burden w/ avg rate of 168, (78-226), possible Atach. Freq PACs, rare PVCs. Monitor 2 - 15% afib/flutter burden @ 115 [phone] Poss Atach. 4 beats NSVT. Freq PACs, rare PVCs; b.)  CHA2DS2-VASc = 4 (age x2, sex, vascular disease history); c.) cardiac rate/rhythm maintained on oral metoprolol  succinate; chronically anticoagulated using apixaban    Bronchial obstruction    Chemotherapy-induced neuropathy (HCC)    Chicken pox    Colon polyp    Coronary artery calcification seen on CT scan    a. 11/2022 CT Chest: Coronary artery calcification and aortic atherosclerosis.   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 04/2023 Echo: EF >55%, nl RV size/fxn.  No significant valvular disease observed.   Hyperlipidemia    Mass of right chest wall    Multiple lung nodules 09/2022   On apixaban  therapy    Osteoporosis    Pectus excavatum    Protein-calorie malnutrition, severe (HCC)    Rectal carcinoma (HCC)    a.) recurrent stage IVA (cT4a, cN2a, cM1a)   Thrombocytopenia (HCC)     PAST SURGICAL HISTORY: Past Surgical History:  Procedure Laterality Date   AXILLARY LYMPH NODE DISSECTION Right 08/21/2016   Procedure: AXILLARY LYMPH NODE excision;  Surgeon: Benancio Bracket, MD;  Location: [facility] ORS;  Service: General;  Laterality: Right;   CATARACT EXTRACTION, BILATERAL Bilateral  COLONOSCOPY N/A 09/19/2020   Procedure: COLONOSCOPY;  Surgeon: Shane Darling, MD;  Location: [facility] ENDOSCOPY;  Service: Endoscopy;  Laterality: N/A;   ENDOBRONCHIAL ULTRASOUND Left 08/05/2023   Procedure: ENDOBRONCHIAL ULTRASOUND;  Surgeon: Marc Senior, MD;  Location: [facility] ORS;  Service:  Pulmonary;  Laterality: Left;   PORTA CATH INSERTION N/A 09/16/2017   Procedure: PORTA CATH INSERTION;  Surgeon: Celso College, MD;  Location: [facility] INVASIVE CV LAB;  Service: Cardiovascular;  Laterality: N/A;   TONSILLECTOMY     TOTAL HIP ARTHROPLASTY Left 1992    FAMILY HISTORY: Family History  Problem Relation Age of Onset   Heart failure Mother    Emphysema Father    Diabetes Sister    Lung cancer Brother    Diabetes Brother    Breast cancer Paternal Aunt    Basal cell carcinoma Daughter     ADVANCED DIRECTIVES (Y/N):  @ADVDIR @  HEALTH MAINTENANCE: Social History   Tobacco Use   Smoking status: Never   Smokeless tobacco: Never  Vaping Use   Vaping status: Never Used  Substance Use Topics   Alcohol use: No   Drug use: No     Colonoscopy:  PAP:  Bone density:  Lipid panel:  No Known Allergies  Current Facility-Administered Medications  Medication Dose Route Frequency Provider Last Rate Last Admin   0.9 %  sodium chloride  infusion   Intravenous Continuous Alphonsus Jeans, MD 75 mL/hr at 04/01/24 1208 New Bag at 04/01/24 1208   acetaminophen  (TYLENOL ) tablet 650 mg  650 mg Oral Q6H PRN Cox, Amy N, DO   650 mg at 03/31/24 2321   Or   acetaminophen  (TYLENOL ) suppository 650 mg  650 mg Rectal Q6H PRN Cox, Amy N, DO       amiodarone  (PACERONE ) tablet 200 mg  200 mg Oral Daily Cox, Amy N, DO   200 mg at 04/01/24 1610   apixaban  (ELIQUIS ) tablet 2.5 mg  2.5 mg Oral BID Cox, Amy N, DO   2.5 mg at 04/01/24 9604   budesonide-glycopyrrolate -formoterol (BREZTRI) 160-9-4.8 MCG/ACT inhaler 2 puff  2 puff Inhalation BID Cox, Amy N, DO   2 puff at 04/01/24 0926   ceFEPIme  (MAXIPIME ) 2 g in sodium chloride  0.9 % 100 mL IVPB  2 g Intravenous Q12H Nazari, Walid A, RPH 200 mL/hr at 04/01/24 1250 2 g at 04/01/24 1250   Chlorhexidine  Gluconate Cloth 2 % PADS 6 each  6 each Topical Daily Cox, Amy N, DO   6 each at 04/01/24 1130   chlorpheniramine-HYDROcodone  (TUSSIONEX) 10-8 MG/5ML  suspension 5 mL  5 mL Oral Q12H PRN Cox, Amy N, DO       feeding supplement (ENSURE ENLIVE / ENSURE PLUS) liquid 237 mL  237 mL Oral TID BM Alphonsus Jeans, MD   237 mL at 04/01/24 1248   gabapentin  (NEURONTIN ) capsule 300 mg  300 mg Oral BID Cox, Amy N, DO   300 mg at 04/01/24 5409   HYDROcodone -acetaminophen  (NORCO/VICODIN) 5-325 MG per tablet 1 tablet  1 tablet Oral Q6H PRN Cox, Amy N, DO       levothyroxine  (SYNTHROID ) tablet 25 mcg  25 mcg Oral Q0600 Cox, Amy N, DO   25 mcg at 04/01/24 8119   melatonin tablet 5 mg  5 mg Oral QHS PRN Cox, Amy N, DO   5 mg at 03/31/24 2321   metroNIDAZOLE  (FLAGYL ) IVPB 500 mg  500 mg Intravenous Q12H Cox, Amy N, DO   Stopped at 04/01/24  6578   mirtazapine  (REMERON ) tablet 15 mg  15 mg Oral QHS Cox, Amy N, DO   15 mg at 03/31/24 2321   multivitamin with minerals tablet 1 tablet  1 tablet Oral Daily Alphonsus Jeans, MD   1 tablet at 04/01/24 4696   ondansetron  (ZOFRAN ) tablet 4 mg  4 mg Oral Q6H PRN Cox, Amy N, DO       Or   ondansetron  (ZOFRAN ) injection 4 mg  4 mg Intravenous Q6H PRN Cox, Amy N, DO       senna-docusate (Senokot-S) tablet 1 tablet  1 tablet Oral QHS PRN Cox, Amy N, DO       simvastatin  (ZOCOR ) tablet 20 mg  20 mg Oral QHS Cox, Amy N, DO   20 mg at 03/31/24 2321   sodium chloride  flush (NS) 0.9 % injection 10-40 mL  10-40 mL Intracatheter Q12H Cox, Amy N, DO   10 mL at 04/01/24 2952   sodium chloride  flush (NS) 0.9 % injection 10-40 mL  10-40 mL Intracatheter PRN Cox, Amy N, DO       [START ON 04/02/2024] vancomycin  (VANCOREADY) IVPB 750 mg/150 mL  750 mg Intravenous Q48H Nazari, Walid A, RPH       Facility-Administered Medications Ordered in Other Encounters  Medication Dose Route Frequency Provider Last Rate Last Admin   heparin  lock flush 100 unit/mL  500 Units Intravenous Once Artrell Lawless J, MD       heparin  lock flush 100 unit/mL  500 Units Intracatheter PRN Adrian Alba, Deadra Everts, MD       heparin  lock flush 100 unit/mL  500  Units Intravenous Once Lundyn Coste, Deadra Everts, MD       heparin  lock flush 100 unit/mL  500 Units Intravenous Once Cobie Marcoux J, MD       sodium chloride  flush (NS) 0.9 % injection 10 mL  10 mL Intravenous PRN Rylee Huestis J, MD   10 mL at 12/04/17 0901   sodium chloride  flush (NS) 0.9 % injection 10 mL  10 mL Intracatheter PRN Shellie Dials, MD       sodium chloride  flush (NS) 0.9 % injection 10 mL  10 mL Intravenous PRN Shellie Dials, MD   10 mL at 04/24/21 0839   yttrium-90 injection 22.3 millicurie  22.3 millicurie Intravenous Once Glenis Langdon, MD        OBJECTIVE: Vitals:   04/01/24 0833 04/01/24 1228  BP: (!) 105/59 (!) 103/57  Pulse: 93 90  Resp:    Temp: 98 F (36.7 C) 98 F (36.7 C)  SpO2: 96% 97%     Body mass index is 16.82 kg/m.    ECOG FS:4 - Bedbound  General: Thin, ill-appearing, no acute distress. Eyes: Pink conjunctiva, anicteric sclera. HEENT: Normocephalic, moist mucous membranes. Lungs: No audible wheezing or coughing. Heart: Regular rate and rhythm. Abdomen: Soft, nontender, no obvious distention. Musculoskeletal: No edema, cyanosis, or clubbing. Neuro: Alert, answering all questions appropriately. Cranial nerves grossly intact. Skin: No rashes or petechiae noted. Psych: Normal affect.  LAB RESULTS:  Lab Results  Component Value Date   NA 136 04/01/2024   K 3.2 (L) 04/01/2024   CL 104 04/01/2024   CO2 22 04/01/2024   GLUCOSE 80 04/01/2024   BUN 14 04/01/2024   CREATININE 0.60 04/01/2024   CALCIUM  7.9 (L) 04/01/2024   PROT 5.9 (L) 03/31/2024   ALBUMIN 3.0 (L) 03/31/2024   AST 17 03/31/2024   ALT 14 03/31/2024   ALKPHOS 75  03/31/2024   BILITOT 0.8 03/31/2024   GFRNONAA >60 04/01/2024   GFRAA >60 06/28/2020    Lab Results  Component Value Date   WBC 8.3 04/01/2024   NEUTROABS 9.8 (H) 03/31/2024   HGB 8.6 (L) 04/01/2024   HCT 27.6 (L) 04/01/2024   MCV 92.9 04/01/2024   PLT 151 04/01/2024     STUDIES: CT Angio  Chest PE W/Cm &/Or Wo Cm Result Date: 03/31/2024 CLINICAL DATA:  Metastatic rectal cancer, lymphoma presenting with hypotension, increasing weakness, and shortness of breath on exertion EXAM: CT ANGIOGRAPHY CHEST WITH CONTRAST TECHNIQUE: Multidetector CT imaging of the chest was performed using the standard protocol during bolus administration of intravenous contrast. Multiplanar CT image reconstructions and MIPs were obtained to evaluate the vascular anatomy. RADIATION DOSE REDUCTION: This exam was performed according to the departmental dose-optimization program which includes automated exposure control, adjustment of the mA and/or kV according to patient size and/or use of iterative reconstruction technique. CONTRAST:  75mL OMNIPAQUE  IOHEXOL  350 MG/ML SOLN COMPARISON:  Radiograph 03/31/2024 and CT 02/12/2024 FINDINGS: Cardiovascular: No pericardial effusion. Normal caliber thoracic aorta without evidence of dissection. Coronary artery and aortic atherosclerotic calcification. Negative for acute pulmonary embolism. Right chest wall Port-A-Cath tip at the superior cavoatrial junction. Mediastinum/Nodes: No mediastinal lymphadenopathy. Trachea and esophagus are unremarkable. Lungs/Pleura: No pleural effusion or pneumothorax. Slightly increased size of the infrahilar mass in the right lower lobe measuring 4.8 x 5.2 cm, previously 4.8 x 5.0 cm. Obstruction of the right lower lobe bronchi with near complete atelectasis of the right lower lobe is not substantially changed from 02/12/2024. Numerous bilateral pulmonary nodules are redemonstrated. The largest on the left is in the left lower lobe and measures 2.9 x 4.0 cm on series 6/image 79, previously 2.9 x 3.7 cm. The largest on the right is in the anterior right upper lobe and measures 2.4 x 1.3 cm, similar to 02/12/2024. The previous ground-glass opacities have nearly resolved. Upper Abdomen: No acute abnormality. Musculoskeletal: No acute fracture or destructive  osseous lesion. Review of the MIP images confirms the above findings. IMPRESSION: 1. Negative for acute pulmonary embolism. 2. Progression of disease with increased size of the right infrahilar mass and dominant left lower lobe nodule. 3. Aortic Atherosclerosis (ICD10-I70.0). Electronically Signed   By: Rozell Cornet M.D.   On: 03/31/2024 17:55   DG Chest Portable 1 View Result Date: 03/31/2024 CLINICAL DATA:  SOB EXAM: PORTABLE CHEST - 1 VIEW COMPARISON:  08/05/2023 FINDINGS: Increase in size and number bibasilar pulmonary nodules. Stable right IJ port catheter. Heart size and mediastinal contours are within normal limits. Aortic Atherosclerosis (ICD10-170.0). No effusion. Visualized bones unremarkable. IMPRESSION: Increase in size and number of bibasilar pulmonary nodules. Electronically Signed   By: Nicoletta Barrier M.D.   On: 03/31/2024 13:37    ASSESSMENT: Progressive stage IV rectal cancer.  Declining performance status with dehydration and diarrhea.  PLAN:    Rectal cancer: Patient last received chemotherapy with FOLFIRI approximately 1 week ago.  She continues to have significant toxicity with treatment and given her declining performance status, I have discussed the option of hospice care.  Patient is now more open to discontinuing treatment.  Will consult palliative care for continued discussions. Diarrhea: Likely secondary to chemotherapy.  Improving. Anemia: Secondary to chemotherapy.  Patient's hemoglobin is 8.6.  Monitor. Hypotension: Secondary to poor p.o. intake as well as diarrhea.  Continue IV fluids as ordered. Possible underlying sepsis: Patient currently on broad-spectrum antibiotics.  Appreciate consult, will follow.  Emeterio Hansen  Lenton Rail, MD   04/01/2024 1:24 PM

## 2024-04-02 ENCOUNTER — Encounter: Payer: Self-pay | Admitting: Oncology

## 2024-04-02 DIAGNOSIS — R627 Adult failure to thrive: Secondary | ICD-10-CM

## 2024-04-02 LAB — CBC
HCT: 23.5 % — ABNORMAL LOW (ref 36.0–46.0)
Hemoglobin: 7.6 g/dL — ABNORMAL LOW (ref 12.0–15.0)
MCH: 29.6 pg (ref 26.0–34.0)
MCHC: 32.3 g/dL (ref 30.0–36.0)
MCV: 91.4 fL (ref 80.0–100.0)
Platelets: 141 10*3/uL — ABNORMAL LOW (ref 150–400)
RBC: 2.57 MIL/uL — ABNORMAL LOW (ref 3.87–5.11)
RDW: 16.6 % — ABNORMAL HIGH (ref 11.5–15.5)
WBC: 6 10*3/uL (ref 4.0–10.5)
nRBC: 0 % (ref 0.0–0.2)

## 2024-04-02 LAB — BASIC METABOLIC PANEL WITH GFR
Anion gap: 5 (ref 5–15)
BUN: 9 mg/dL (ref 8–23)
CO2: 25 mmol/L (ref 22–32)
Calcium: 7.7 mg/dL — ABNORMAL LOW (ref 8.9–10.3)
Chloride: 109 mmol/L (ref 98–111)
Creatinine, Ser: 0.7 mg/dL (ref 0.44–1.00)
GFR, Estimated: >60 mL/min (ref >60)
Glucose, Bld: 82 mg/dL (ref 70–99)
Potassium: 3.2 mmol/L — ABNORMAL LOW (ref 3.5–5.1)
Sodium: 139 mmol/L (ref 135–145)

## 2024-04-02 LAB — URINE CULTURE

## 2024-04-02 LAB — MAGNESIUM: Magnesium: 1.6 mg/dL — ABNORMAL LOW (ref 1.7–2.4)

## 2024-04-02 MED ORDER — SODIUM CHLORIDE 0.9 % IV SOLN
1.0000 g | INTRAVENOUS | Status: DC
  Administered 2024-04-02: 1 g via INTRAVENOUS
  Filled 2024-04-02 (×2): qty 10

## 2024-04-02 MED ORDER — POTASSIUM CHLORIDE CRYS ER 20 MEQ PO TBCR
40.0000 meq | EXTENDED_RELEASE_TABLET | Freq: Once | ORAL | Status: AC
  Administered 2024-04-02: 40 meq via ORAL
  Filled 2024-04-02: qty 2

## 2024-04-02 MED ORDER — METRONIDAZOLE 500 MG/100ML IV SOLN
500.0000 mg | Freq: Two times a day (BID) | INTRAVENOUS | Status: DC
  Administered 2024-04-02: 500 mg via INTRAVENOUS
  Filled 2024-04-02: qty 100

## 2024-04-02 MED ORDER — CHOLESTYRAMINE LIGHT 4 G PO PACK
4.0000 g | PACK | Freq: Two times a day (BID) | ORAL | Status: DC
  Administered 2024-04-03: 4 g via ORAL
  Filled 2024-04-02 (×2): qty 1

## 2024-04-02 MED ORDER — SODIUM CHLORIDE 0.9 % IV SOLN
2.0000 g | Freq: Two times a day (BID) | INTRAVENOUS | Status: DC
  Administered 2024-04-02: 2 g via INTRAVENOUS
  Filled 2024-04-02: qty 12.5

## 2024-04-02 NOTE — Progress Notes (Signed)
 [facility] Liaison Note  Received request from Clearence Curet, RN , Transitions of Care Manager, for hospice services at home after discharge.  Spoke with family  to initiate education related to hospice philosophy, services, and team approach to care. Family verbalized understanding of information given.  Per discussion, the plan is for discharge home via family car when medically stable.      DME needs discussed.  Patient has the following equipment in the home:  Adjustable bed and walker  Patient/family requests the following equipment in the home:BSC, Shower chair, WC and over the bed table.    The address has been verified and is correct in the chart. Veronica Boyd and phone number [phone]  is the family contact to arrange time of equipment delivery.    Please send signed and completed DNR home with the patient/family.  Please provide prescriptions at discharge as needed to ensure ongoing symptom management.   [facility] information and contact numbers given to family  Above information shared with Clearence Curet, RN, Transitions of Care Manager.     Please call with any Hospice related questions or concerns.  Thank you for the opportunity to participate in this patient's care.  Helmut Lobe, [facility] Liaison [phone]

## 2024-04-02 NOTE — Plan of Care (Signed)

## 2024-04-02 NOTE — Plan of Care (Signed)
  Problem: Education: Goal: Knowledge of General Education information will improve Description: Including pain rating scale, medication(s)/side effects and non-pharmacologic comfort measures Outcome: Progressing   Problem: Clinical Measurements: Goal: Respiratory complications will improve Outcome: Progressing   Problem: Clinical Measurements: Goal: Cardiovascular complication will be avoided Outcome: Progressing   Problem: Pain Managment: Goal: General experience of comfort will improve and/or be controlled Outcome: Progressing   Problem: Safety: Goal: Ability to remain free from injury will improve Outcome: Progressing

## 2024-04-02 NOTE — Evaluation (Signed)
 Occupational Therapy Evaluation Patient Details Name: Veronica Boyd MRN: [id number] DOB: May 26, 1946 Today's Date: 04/02/2024   History of Present Illness   Patient is an 78 year old female who recently received chemotherapy with FOLFIRI approximately 1 week ago for progressive stage IV rectal cancer.  She has had poor appetite, significant diarrhea, and declining performance status     Clinical Impressions Upon entering the room, pt supine in bed with NT present attempting to assist pt with hygiene and linen change. Pt is agreeable to OT evaluation. Pt rolls L <> R with mod A and use of bed rail. Pt declines sitting EOB or transfers this session. Total A for hygiene and linen change. Pt endorses feeling very weak. She states she was Ind with use of rollator at home prior to hospital admission. She lives with daughter and she has been assisting pt more at home with cancer progressing for safety. OT discussed OT purpose and pt's goals. Pt ultimately decided that she declines therapy services (both OT and PT) but did thank me for my visit. OT will complete orders at this time.        Equipment Recommendations   Hospital bed      Precautions/Restrictions   Precautions Precautions: Fall     Mobility Bed Mobility Overal bed mobility: Needs Assistance Bed Mobility: Rolling Rolling: Mod assist              Transfers                   General transfer comment: Pt declined attempts          ADL either performed or assessed with clinical judgement   ADL                                         General ADL Comments: total A for hygiene needs and linen change in bed. Pt declined EOB and transfer attempts.     Vision Patient Visual Report: No change from baseline              Pertinent Vitals/Pain Pain Assessment Pain Assessment: Faces Faces Pain Scale: Hurts little more Pain Location: buttocks Pain Descriptors / Indicators:  Discomfort Pain Intervention(s): Limited activity within patient's tolerance, Repositioned, Monitored during session     Extremity/Trunk Assessment Upper Extremity Assessment Upper Extremity Assessment: Generalized weakness   Lower Extremity Assessment Lower Extremity Assessment: Generalized weakness       Communication Communication Communication: No apparent difficulties   Cognition Arousal: Alert Behavior During Therapy: WFL for tasks assessed/performed Cognition: No apparent impairments                               Following commands: Intact                  Home Living Family/patient expects to be discharged to:: Private residence Living Arrangements: Children Available Help at Discharge: Family;Available 24 hours/day Type of Home: House Home Access: Level entry     Home Layout: One level     Bathroom Shower/Tub: Tub/shower unit         Home Equipment: Rollator (4 wheels)          Prior Functioning/Environment Prior Level of Function : Independent/Modified Independent;Needs assist               ADLs Comments: Pt  reports being Ind prior to hospital stay and CA advancing. Her daughter has been assisting more so that she does not fall at home.            OT Goals(Current goals can be found in the care plan section)   Acute Rehab OT Goals Patient Stated Goal: to go home OT Goal Formulation: With patient Time For Goal Achievement: 04/02/24 Potential to Achieve Goals: Good   OT Frequency:          AM-PAC OT "6 Clicks" Daily Activity     Outcome Measure Help from another person eating meals?: None Help from another person taking care of personal grooming?: None Help from another person toileting, which includes using toliet, bedpan, or urinal?: A Lot Help from another person bathing (including washing, rinsing, drying)?: A Lot Help from another person to put on and taking off regular upper body clothing?: A Lot Help  from another person to put on and taking off regular lower body clothing?: A Lot 6 Click Score: 16   End of Session Nurse Communication: Mobility status;Other (comment) (pt politely declines therapy)  Activity Tolerance: Patient limited by fatigue Patient left: in bed                   Time: 0850-0905 OT Time Calculation (min): 15 min Charges:  OT General Charges $OT Visit: 1 Visit OT Evaluation $OT Eval Low Complexity: 1 Low George Kinder, MS, OTR/L , CBIS ascom [phone]  04/02/24, 9:11 AM

## 2024-04-02 NOTE — TOC Progression Note (Signed)
 Transition of Care [facility]) - Progression Note    Patient Details  Name: Veronica Boyd MRN: [id number] Date of Birth: 1946-05-02  [facility] CM/SW Contact  Marino Sias, RN Phone Number: 04/02/2024, 1:48 PM  Clinical Narrative: Spoke with patient, family at bedside, about Home Hospice. List given, family and patient prefers [facility]. Luan Rumpf, [facility] notified and will speak with patient/family.          Expected Discharge Plan and Services                                               Social Determinants of Health (SDOH) Interventions SDOH Screenings   Food Insecurity: No Food Insecurity (03/31/2024)  Housing: Low Risk  (03/31/2024)  Transportation Needs: No Transportation Needs (03/31/2024)  Utilities: Not At Risk (03/31/2024)  Financial Resource Strain: Low Risk  (07/30/2023)   Received from [facility]  Social Connections: Moderately Isolated (03/31/2024)  Tobacco Use: Low Risk  (03/31/2024)    Readmission Risk Interventions     No data to display

## 2024-04-02 NOTE — Progress Notes (Signed)
 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 Pt and pt's decided to proceed w/ hospice at home. Hospice following and recs apprec   Severe sepsis: see Dr. Diann Forth note on how pt met severe sepsis criteria. Urine cx is pending. Blood cxs NGTD. No pneumonia seen on CXR. Abxs were changed to IV rocephin  Possible UTI: urine cx is pending. Abxs changed to IV rocephin  Diarrhea: likely secondary to recent chemo. GI PCR panel & c. diff are both neg. Continue on lomotil  but no improvement so far. Started on cholestyramine    Hypokalemia: potassium given    Chronic pain: likely secondary to cancer. Continue on home dose of norco    Hypothyroidism: continue on home dose of levothyroxine     PAF: continue on home dose of amio, eliquis      Severe protein calorie malnutrition: continue on oral supplements    HLD: continue on statin     Lymphoma: in remission as per pt. F/u w/ onco outpatient   Rectal Cancer: stage IV. Received chemo approx 1 week ago. Palliative care recs apprec     DVT prophylaxis: eliquis  Code Status: DNR Family Communication: discussed pt's care w/ pt's family at bedside and answered their questions  Disposition Plan: d/c home tomorrow w/ hospice   Level of care: Progressive  Status is: Inpatient Remains inpatient appropriate because: severity of  illness,    Consultants:  Onco   Procedures:  Antimicrobials: rocephin    Subjective: Pt c/o malaise   Objective: Vitals:   04/01/24 2255 04/01/24 2300 04/01/24 2329 04/02/24 0900  BP: (!) 96/44 (!) 101/47 (!) 100/50 (!) 103/53  Pulse: 81   87  Resp: 17     Temp: 98.7 F (37.1 C)   98.5 F (36.9 C)  TempSrc: Oral   Oral  SpO2: 97%   93%  Weight:      Height:        Intake/Output Summary (Last 24 hours) at 04/02/2024 0901 Last data filed at 04/02/2024 0700 Gross per 24 hour  Intake 703.17 ml  Output --  Net 703.17 ml   Filed Weights   03/31/24 1944  Weight: 36.5 kg    Examination:  General exam: appears uncomfortable. Cachetic appearing  Respiratory system: diminished breath sounds b/l  Cardiovascular system: S1/S2+. No rubs or clicks  Gastrointestinal system: Abd is soft, NT, ND & hypoactive bowel sounds  Central nervous system: alert & oriented. Moves all extremities  Psychiatry: judgement and insight appears normal. Flat mood and affect     Data Reviewed: I have personally reviewed following labs and imaging studies  CBC: Recent Labs  Lab 03/31/24 1128 04/01/24 0248 04/02/24 0432  WBC 11.7* 8.3 6.0  NEUTROABS 9.8*  --   --   HGB 11.8* 8.6* 7.6*  HCT 37.5 27.6* 23.5*  MCV 93.8 92.9 91.4  PLT 172 151 141*   Basic Metabolic  Panel: Recent Labs  Lab 03/31/24 1329 04/01/24 0248 04/02/24 0432  NA 137 136 139  K 4.0 3.2* 3.2*  CL 104 104 109  CO2 23 22 25   GLUCOSE 87 80 82  BUN 19 14 9   CREATININE 0.70 0.60 0.70  CALCIUM  8.6* 7.9* 7.7*   GFR: Estimated Creatinine Clearance: 32.3 mL/min (by C-G formula based on SCr of 0.7 mg/dL). Liver Function Tests: Recent Labs  Lab 03/31/24 1329  AST 17  ALT 14  ALKPHOS 75  BILITOT 0.8  PROT 5.9*  ALBUMIN 3.0*   No results for input(s): "LIPASE", "AMYLASE" in the last 168 hours. No results for input(s): "AMMONIA" in the last 168 hours. Coagulation Profile: No results for input(s): "INR",  "PROTIME" in the last 168 hours. Cardiac Enzymes: No results for input(s): "CKTOTAL", "CKMB", "CKMBINDEX", "TROPONINI" in the last 168 hours. BNP (last 3 results) No results for input(s): "PROBNP" in the last 8760 hours. HbA1C: No results for input(s): "HGBA1C" in the last 72 hours. CBG: No results for input(s): "GLUCAP" in the last 168 hours. Lipid Profile: No results for input(s): "CHOL", "HDL", "LDLCALC", "TRIG", "CHOLHDL", "LDLDIRECT" in the last 72 hours. Thyroid  Function Tests: No results for input(s): "TSH", "T4TOTAL", "FREET4", "T3FREE", "THYROIDAB" in the last 72 hours. Anemia Panel: No results for input(s): "VITAMINB12", "FOLATE", "FERRITIN", "TIBC", "IRON", "RETICCTPCT" in the last 72 hours. Sepsis Labs: Recent Labs  Lab 03/31/24 1128 03/31/24 1329  LATICACIDVEN 3.5* 2.4*    Recent Results (from the past 240 hours)  Blood culture (routine x 2)     Status: None (Preliminary result)   Collection Time: 03/31/24 11:28 AM   Specimen: BLOOD  Result Value Ref Range Status   Specimen Description BLOOD LEFT ANTECUBITAL  Final   Special Requests   Final    BOTTLES DRAWN AEROBIC AND ANAEROBIC Blood Culture results may not be optimal due to an inadequate volume of blood received in culture bottles   Culture   Final    NO GROWTH 2 DAYS Performed at [facility], [address], [place], [place] [address]    Report Status PENDING  Incomplete  Resp panel by RT-PCR (RSV, Flu A&B, Covid) Anterior Nasal Swab     Status: None   Collection Time: 03/31/24 11:57 AM   Specimen: Anterior Nasal Swab  Result Value Ref Range Status   SARS Coronavirus 2 by RT PCR NEGATIVE NEGATIVE Final    Comment: (NOTE) SARS-CoV-2 target nucleic acids are NOT DETECTED.  The SARS-CoV-2 RNA is generally detectable in upper respiratory specimens during the acute phase of infection. The lowest concentration of SARS-CoV-2 viral copies this assay can detect is 138 copies/mL. A negative result does  not preclude SARS-Cov-2 infection and should not be used as the sole basis for treatment or other patient management decisions. A negative result may occur with  improper specimen collection/handling, submission of specimen other than nasopharyngeal swab, presence of viral mutation(s) within the areas targeted by this assay, and inadequate number of viral copies(<138 copies/mL). A negative result must be combined with clinical observations, patient history, and epidemiological information. The expected result is Negative.  Fact Sheet for Patients:  BloggerCourse.com  Fact Sheet for Healthcare Providers:  SeriousBroker.it  This test is no t yet approved or cleared by the [place]  FDA and  has been authorized for detection and/or diagnosis of SARS-CoV-2 by FDA under an Emergency Use Authorization (EUA). This EUA will remain  in effect (meaning this test can be used) for the duration of  the COVID-19 declaration under Section 564(b)(1) of the Act, 21 U.S.C.section 360bbb-3(b)(1), unless the authorization is terminated  or revoked sooner.       Influenza A by PCR NEGATIVE NEGATIVE Final   Influenza B by PCR NEGATIVE NEGATIVE Final    Comment: (NOTE) The Xpert Xpress SARS-CoV-2/FLU/RSV plus assay is intended as an aid in the diagnosis of influenza from Nasopharyngeal swab specimens and should not be used as a sole basis for treatment. Nasal washings and aspirates are unacceptable for Xpert Xpress SARS-CoV-2/FLU/RSV testing.  Fact Sheet for Patients: BloggerCourse.com  Fact Sheet for Healthcare Providers: SeriousBroker.it  This test is not yet approved or cleared by the [place]  FDA and has been authorized for detection and/or diagnosis of SARS-CoV-2 by FDA under an Emergency Use Authorization (EUA). This EUA will remain in effect (meaning this test can be used) for the  duration of the COVID-19 declaration under Section 564(b)(1) of the Act, 21 U.S.C. section 360bbb-3(b)(1), unless the authorization is terminated or revoked.     Resp Syncytial Virus by PCR NEGATIVE NEGATIVE Final    Comment: (NOTE) Fact Sheet for Patients: BloggerCourse.com  Fact Sheet for Healthcare Providers: SeriousBroker.it  This test is not yet approved or cleared by the [place]  FDA and has been authorized for detection and/or diagnosis of SARS-CoV-2 by FDA under an Emergency Use Authorization (EUA). This EUA will remain in effect (meaning this test can be used) for the duration of the COVID-19 declaration under Section 564(b)(1) of the Act, 21 U.S.C. section 360bbb-3(b)(1), unless the authorization is terminated or revoked.  Performed at [facility], [address] [place], [place] [address]   Blood culture (routine x 2)     Status: None (Preliminary result)   Collection Time: 03/31/24  1:29 PM   Specimen: BLOOD  Result Value Ref Range Status   Specimen Description BLOOD BLOOD RIGHT WRIST  Final   Special Requests   Final    BOTTLES DRAWN AEROBIC ONLY Blood Culture results may not be optimal due to an inadequate volume of blood received in culture bottles   Culture   Final    NO GROWTH 2 DAYS Performed at [facility], [address] [place], [place] [address]    Report Status PENDING  Incomplete  Gastrointestinal Panel by PCR , Stool     Status: None   Collection Time: 03/31/24  9:31 PM   Specimen: Stool  Result Value Ref Range Status   Campylobacter species NOT DETECTED NOT DETECTED Final   Plesimonas shigelloides NOT DETECTED NOT DETECTED Final   Salmonella species NOT DETECTED NOT DETECTED Final   Yersinia enterocolitica NOT DETECTED NOT DETECTED Final   Vibrio species NOT DETECTED NOT DETECTED Final   Vibrio cholerae NOT DETECTED NOT DETECTED Final   Enteroaggregative E coli (EAEC)  NOT DETECTED NOT DETECTED Final   Enteropathogenic E coli (EPEC) NOT DETECTED NOT DETECTED Final   Enterotoxigenic E coli (ETEC) NOT DETECTED NOT DETECTED Final   Shiga like toxin producing E coli (STEC) NOT DETECTED NOT DETECTED Final   Shigella/Enteroinvasive E coli (EIEC) NOT DETECTED NOT DETECTED Final   Cryptosporidium NOT DETECTED NOT DETECTED Final   Cyclospora cayetanensis NOT DETECTED NOT DETECTED Final   Entamoeba histolytica NOT DETECTED NOT DETECTED Final   Giardia lamblia NOT DETECTED NOT DETECTED Final   Adenovirus F40/41 NOT DETECTED NOT DETECTED Final   Astrovirus NOT DETECTED NOT DETECTED Final   Norovirus GI/GII NOT DETECTED NOT DETECTED Final   Rotavirus A NOT  DETECTED NOT DETECTED Final   Sapovirus (I, II, IV, and V) NOT DETECTED NOT DETECTED Final    Comment: Performed at [facility], [address] [place], [place] [address]  C Difficile Quick Screen w PCR reflex     Status: None   Collection Time: 03/31/24  9:31 PM   Specimen: STOOL  Result Value Ref Range Status   C Diff antigen NEGATIVE NEGATIVE Final   C Diff toxin NEGATIVE NEGATIVE Final   C Diff interpretation No C. difficile detected.  Final    Comment: Performed at [facility], [address] [place], [place] [address]  MRSA Next Gen by PCR, Nasal     Status: None   Collection Time: 03/31/24  9:34 PM   Specimen: STOOL; Nasal Swab  Result Value Ref Range Status   MRSA by PCR Next Gen NOT DETECTED NOT DETECTED Final    Comment: (NOTE) The GeneXpert MRSA Assay (FDA approved for NASAL specimens only), is one component of a comprehensive MRSA colonization surveillance program. It is not intended to diagnose MRSA infection nor to guide or monitor treatment for MRSA infections. Test performance is not FDA approved in patients less than 86 years old. Performed at [facility], [address] [place], [place] [address]   Urine Culture     Status: None (Preliminary result)    Collection Time: 04/01/24  4:00 PM   Specimen: Urine, Clean Catch  Result Value Ref Range Status   Specimen Description   Final    URINE, CLEAN CATCH Performed at [facility], [address], [place], [place] [address]    Special Requests   Final    NONE Reflexed from [id number] Performed at [facility], [address] [place], [place] [address]    Culture PENDING  Incomplete   Report Status PENDING  Incomplete         Radiology Studies: CT Angio Chest PE W/Cm &/Or Wo Cm Result Date: 03/31/2024 CLINICAL DATA:  Metastatic rectal cancer, lymphoma presenting with hypotension, increasing weakness, and shortness of breath on exertion EXAM: CT ANGIOGRAPHY CHEST WITH CONTRAST TECHNIQUE: Multidetector CT imaging of the chest was performed using the standard protocol during bolus administration of intravenous contrast. Multiplanar CT image reconstructions and MIPs were obtained to evaluate the vascular anatomy. RADIATION DOSE REDUCTION: This exam was performed according to the departmental dose-optimization program which includes automated exposure control, adjustment of the mA and/or kV according to patient size and/or use of iterative reconstruction technique. CONTRAST:  75mL OMNIPAQUE  IOHEXOL  350 MG/ML SOLN COMPARISON:  Radiograph 03/31/2024 and CT 02/12/2024 FINDINGS: Cardiovascular: No pericardial effusion. Normal caliber thoracic aorta without evidence of dissection. Coronary artery and aortic atherosclerotic calcification. Negative for acute pulmonary embolism. Right chest wall Port-A-Cath tip at the superior cavoatrial junction. Mediastinum/Nodes: No mediastinal lymphadenopathy. Trachea and esophagus are unremarkable. Lungs/Pleura: No pleural effusion or pneumothorax. Slightly increased size of the infrahilar mass in the right lower lobe measuring 4.8 x 5.2 cm, previously 4.8 x 5.0 cm. Obstruction of the right lower lobe bronchi with near complete atelectasis of the right lower  lobe is not substantially changed from 02/12/2024. Numerous bilateral pulmonary nodules are redemonstrated. The largest on the left is in the left lower lobe and measures 2.9 x 4.0 cm on series 6/image 79, previously 2.9 x 3.7 cm. The largest on the right is in the anterior right upper lobe and measures 2.4 x 1.3 cm, similar to 02/12/2024. The previous ground-glass opacities have nearly resolved.  Upper Abdomen: No acute abnormality. Musculoskeletal: No acute fracture or destructive osseous lesion. Review of the MIP images confirms the above findings. IMPRESSION: 1. Negative for acute pulmonary embolism. 2. Progression of disease with increased size of the right infrahilar mass and dominant left lower lobe nodule. 3. Aortic Atherosclerosis (ICD10-I70.0). Electronically Signed   By: Rozell Cornet M.D.   On: 03/31/2024 17:55   DG Chest Portable 1 View Result Date: 03/31/2024 CLINICAL DATA:  SOB EXAM: PORTABLE CHEST - 1 VIEW COMPARISON:  08/05/2023 FINDINGS: Increase in size and number bibasilar pulmonary nodules. Stable right IJ port catheter. Heart size and mediastinal contours are within normal limits. Aortic Atherosclerosis (ICD10-170.0). No effusion. Visualized bones unremarkable. IMPRESSION: Increase in size and number of bibasilar pulmonary nodules. Electronically Signed   By: Nicoletta Barrier M.D.   On: 03/31/2024 13:37        Scheduled Meds:  amiodarone   200 mg Oral Daily   apixaban   2.5 mg Oral BID   budesonide-glycopyrrolate -formoterol  2 puff Inhalation BID   Chlorhexidine  Gluconate Cloth  6 each Topical Daily   feeding supplement  237 mL Oral TID BM   gabapentin   300 mg Oral BID   levothyroxine   25 mcg Oral Q0600   mirtazapine   15 mg Oral QHS   multivitamin with minerals  1 tablet Oral Daily   potassium chloride   40 mEq Oral Once   simvastatin   20 mg Oral QHS   sodium chloride  flush  10-40 mL Intracatheter Q12H   Continuous Infusions:  sodium chloride  75 mL/hr at 04/02/24 0117    cefTRIAXone (ROCEPHIN)  IV       LOS: 2 days       Alphonsus Jeans, MD [facility] Pager [phone]  If 7PM-7AM, please contact night-coverage [email or web address] Password [email or web address] 04/02/2024, 9:01 AM

## 2024-04-02 NOTE — Care Management Important Message (Signed)
 Important Message  Patient Details  Name: Veronica Boyd MRN: [id number] Date of Birth: September 19, 1946   Important Message Given:  Yes - Medicare IM     Anise Kerns 04/02/2024, 2:53 PM

## 2024-04-02 NOTE — Progress Notes (Addendum)
 PT Cancellation Note  Patient Details Name: Veronica Boyd MRN: [id number] DOB: Sep 07, 1946   Cancelled Treatment:    Reason Eval/Treat Not Completed: Other (comment).  PT consult received.  Chart reviewed.  Per NP [employer] note 04/01/24 pt would like to pursue hospice at home.  Per OT, pt declining therapy services (both OT and PT).  Will complete PT order at this time and sign off (nurse notified).  Amador Junes, PT 04/02/24, 1:26 PM

## 2024-04-02 NOTE — Progress Notes (Signed)
 Patient enrolled in services on 03/19/2024 and initial behavioral health evaluation is scheduled for 04/02/2024.

## 2024-04-03 DIAGNOSIS — R6520 Severe sepsis without septic shock: Secondary | ICD-10-CM | POA: Diagnosis not present

## 2024-04-03 DIAGNOSIS — A419 Sepsis, unspecified organism: Secondary | ICD-10-CM | POA: Diagnosis not present

## 2024-04-03 LAB — CBC
HCT: 25 % — ABNORMAL LOW (ref 36.0–46.0)
Hemoglobin: 7.5 g/dL — ABNORMAL LOW (ref 12.0–15.0)
MCH: 28.5 pg (ref 26.0–34.0)
MCHC: 30 g/dL (ref 30.0–36.0)
MCV: 95.1 fL (ref 80.0–100.0)
Platelets: 150 10*3/uL (ref 150–400)
RBC: 2.63 MIL/uL — ABNORMAL LOW (ref 3.87–5.11)
RDW: 17.7 % — ABNORMAL HIGH (ref 11.5–15.5)
WBC: 4.5 10*3/uL (ref 4.0–10.5)
nRBC: 0 % (ref 0.0–0.2)

## 2024-04-03 LAB — BASIC METABOLIC PANEL WITH GFR
Anion gap: 6 (ref 5–15)
BUN: 7 mg/dL — ABNORMAL LOW (ref 8–23)
CO2: 26 mmol/L (ref 22–32)
Calcium: 7.5 mg/dL — ABNORMAL LOW (ref 8.9–10.3)
Chloride: 103 mmol/L (ref 98–111)
Creatinine, Ser: 0.67 mg/dL (ref 0.44–1.00)
GFR, Estimated: >60 mL/min (ref >60)
Glucose, Bld: 110 mg/dL — ABNORMAL HIGH (ref 70–99)
Potassium: 3.1 mmol/L — ABNORMAL LOW (ref 3.5–5.1)
Sodium: 135 mmol/L (ref 135–145)

## 2024-04-03 MED ORDER — HYDROCODONE-ACETAMINOPHEN 5-325 MG PO TABS: 1.0000 | ORAL_TABLET | Freq: Four times a day (QID) | ORAL | 0 refills | Status: AC | PRN

## 2024-04-03 MED ORDER — HEPARIN SOD (PORK) LOCK FLUSH 100 UNIT/ML IV SOLN
500.0000 [IU] | INTRAVENOUS | Status: AC | PRN
  Administered 2024-04-03: 500 [IU]

## 2024-04-03 MED ORDER — MIDODRINE HCL 5 MG PO TABS
10.0000 mg | ORAL_TABLET | Freq: Once | ORAL | Status: AC
  Administered 2024-04-03: 10 mg via ORAL
  Filled 2024-04-03: qty 2

## 2024-04-03 MED ORDER — POTASSIUM CHLORIDE CRYS ER 20 MEQ PO TBCR
40.0000 meq | EXTENDED_RELEASE_TABLET | Freq: Once | ORAL | Status: AC
  Administered 2024-04-03: 40 meq via ORAL
  Filled 2024-04-03: qty 2

## 2024-04-03 MED ORDER — FLUCONAZOLE 50 MG PO TABS
150.0000 mg | ORAL_TABLET | Freq: Every day | ORAL | Status: DC
  Administered 2024-04-03: 150 mg via ORAL
  Filled 2024-04-03: qty 1

## 2024-04-03 MED ORDER — FLUCONAZOLE 150 MG PO TABS: 150.0000 mg | ORAL_TABLET | Freq: Every day | ORAL | 0 refills | Status: AC

## 2024-04-03 MED ORDER — HYDROCOD POLI-CHLORPHE POLI ER 10-8 MG/5ML PO SUER: 5.0000 mL | Freq: Two times a day (BID) | ORAL | 0 refills | Status: AC | PRN

## 2024-04-03 NOTE — Progress Notes (Signed)
 [facility]  Telephone:([phone]) [phone] Fax:(336) [phone]  ID: Veronica Boyd OB: November 26, 1945  MR#: [id number]  ZDG#:[id number]  Patient Care Team: Antonio Baumgarten, MD as PCP - General (Internal Medicine) Devorah Fonder, MD as PCP - Cardiology (Cardiology) Ardeen Kohler, MD as PCP - Electrophysiology (Cardiology) Shellie Dials, MD as Consulting Physician (Oncology) Rochell Chroman, RN as Oncology Nurse Navigator Marc Senior, MD as Consulting Physician (Pulmonary Disease)  CHIEF COMPLAINT: Progressive stage IV rectal cancer.  INTERVAL HISTORY: Patient resting comfortably.  Daughter at bedside.  Likely discharge today with hospice and comfort care.  REVIEW OF SYSTEMS:   Review of Systems  Unable to perform ROS: Medical condition    PAST MEDICAL HISTORY: Past Medical History:  Diagnosis Date   Anemia due to antineoplastic chemotherapy    Aortic atherosclerosis (HCC)    Arthritis    Atrial fibrillation and flutter (HCC)    a.) 04/2023 Zio: Monitor 1 - 16 % aflutter burden w/ avg rate of 168, (78-226), possible Atach. Freq PACs, rare PVCs. Monitor 2 - 15% afib/flutter burden @ [phone] Poss Atach. 4 beats NSVT. Freq PACs, rare PVCs; b.)  CHA2DS2-VASc = 4 (age x2, sex, vascular disease history); c.) cardiac rate/rhythm maintained on oral metoprolol  succinate; chronically anticoagulated using apixaban    Bronchial obstruction    Chemotherapy-induced neuropathy (HCC)    Chicken pox    Colon polyp    Coronary artery calcification seen on CT scan    a. 11/2022 CT Chest: Coronary artery calcification and aortic atherosclerosis.   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 04/2023 Echo: EF >55%, nl RV size/fxn.  No significant valvular disease observed.   Hyperlipidemia    Mass of right chest wall     Multiple lung nodules 09/2022   On apixaban  therapy    Osteoporosis    Pectus excavatum    Protein-calorie malnutrition, severe (HCC)    Rectal carcinoma (HCC)    a.) recurrent stage IVA (cT4a, cN2a, cM1a)   Thrombocytopenia (HCC)     PAST SURGICAL HISTORY: Past Surgical History:  Procedure Laterality Date   AXILLARY LYMPH NODE DISSECTION Right 08/21/2016   Procedure: AXILLARY LYMPH NODE excision;  Surgeon: Benancio Bracket, MD;  Location: [facility] ORS;  Service: General;  Laterality: Right;   CATARACT EXTRACTION, BILATERAL Bilateral    COLONOSCOPY N/A 09/19/2020   Procedure: COLONOSCOPY;  Surgeon: Shane Darling, MD;  Location: [facility] ENDOS[facility];  Service: Endoscopy;  Laterality: N/A;   ENDOBRONCHIAL ULTRASOUND Left 08/05/2023   Procedure: ENDOBRONCHIAL ULTRASOUND;  Surgeon: Marc Senior, MD;  Location: [facility] ORS;  Service: Pulmonary;  Laterality: Left;   PORTA CATH INSERTION N/A 09/16/2017   Procedure: PORTA CATH INSERTION;  Surgeon: Celso College, MD;  Location: [facility];  Service: Cardiovascular;  Laterality: N/A;   TONSILLECTOMY     TOTAL HIP ARTHROPLASTY Left 1992    FAMILY HISTORY: Family History  Problem Relation Age of Onset   Heart failure Mother    Emphysema Father    Diabetes Sister    Lung cancer Brother    Diabetes Brother    Breast cancer Paternal Aunt    Basal cell carcinoma Daughter     ADVANCED DIRECTIVES (Y/N):  @ADVDIR @  HEALTH MAINTENANCE: Social History   Tobacco Use   Smoking status:  Never   Smokeless tobacco: Never  Vaping Use   Vaping status: Never Used  Substance Use Topics   Alcohol use: No   Drug use: No     Colonoscopy:  PAP:  Bone density:  Lipid panel:  No Known Allergies  Current Facility-Administered Medications  Medication Dose Route Frequency Provider Last Rate Last Admin   0.9 %  sodium chloride  infusion   Intravenous Continuous Alphonsus Jeans, MD 75 mL/hr at 04/02/24 1717 New Bag at 04/02/24  1717   acetaminophen  (TYLENOL ) tablet 650 mg  650 mg Oral Q6H PRN Cox, Amy N, DO   650 mg at 03/31/24 2321   Or   acetaminophen  (TYLENOL ) suppository 650 mg  650 mg Rectal Q6H PRN Cox, Amy N, DO       amiodarone  (PACERONE ) tablet 200 mg  200 mg Oral Daily Cox, Amy N, DO   200 mg at 04/03/24 0908   apixaban  (ELIQUIS ) tablet 2.5 mg  2.5 mg Oral BID Cox, Amy N, DO   2.5 mg at 04/03/24 0909   budesonide -glycopyrrolate -formoterol  (BREZTRI ) 160-9-4.8 MCG/ACT inhaler 2 puff  2 puff Inhalation BID Cox, Amy N, DO   2 puff at 04/01/24 6045   Chlorhexidine  Gluconate Cloth 2 % PADS 6 each  6 each Topical Daily Cox, Amy N, DO   6 each at 04/03/24 0909   chlorpheniramine-HYDROcodone  (TUSSIONEX) 10-8 MG/5ML suspension 5 mL  5 mL Oral Q12H PRN Cox, Amy N, DO       cholestyramine  light (PREVALITE ) packet 4 g  4 g Oral BID Alphonsus Jeans, MD   4 g at 04/03/24 1215   diphenoxylate -atropine  (LOMOTIL ) 2.5-0.025 MG per tablet 1-2 tablet  1-2 tablet Oral QID PRN Borders, Joshua R, NP   1 tablet at 04/02/24 2154   feeding supplement (ENSURE ENLIVE / ENSURE PLUS) liquid 237 mL  237 mL Oral TID BM Alphonsus Jeans, MD   237 mL at 04/03/24 1248   fluconazole  (DIFLUCAN ) tablet 150 mg  150 mg Oral Daily Alphonsus Jeans, MD   150 mg at 04/03/24 4098   gabapentin  (NEURONTIN ) capsule 300 mg  300 mg Oral BID Cox, Amy N, DO   300 mg at 04/03/24 0908   HYDROcodone -acetaminophen  (NORCO/VICODIN) 5-325 MG per tablet 1 tablet  1 tablet Oral Q6H PRN Cox, Amy N, DO       levothyroxine  (SYNTHROID ) tablet 25 mcg  25 mcg Oral Q0600 Cox, Amy N, DO   25 mcg at 04/03/24 1191   melatonin tablet 5 mg  5 mg Oral QHS PRN Cox, Amy N, DO   5 mg at 04/02/24 2154   mirtazapine  (REMERON ) tablet 15 mg  15 mg Oral QHS Cox, Amy N, DO   15 mg at 04/02/24 2154   multivitamin with minerals tablet 1 tablet  1 tablet Oral Daily Alphonsus Jeans, MD   1 tablet at 04/03/24 0908   ondansetron  (ZOFRAN ) tablet 4 mg  4 mg Oral Q6H PRN Cox, Amy N, DO        Or   ondansetron  (ZOFRAN ) injection 4 mg  4 mg Intravenous Q6H PRN Cox, Amy N, DO       senna-docusate (Senokot-S) tablet 1 tablet  1 tablet Oral QHS PRN Cox, Amy N, DO       simvastatin  (ZOCOR ) tablet 20 mg  20 mg Oral QHS Cox, Amy N, DO   20 mg at 04/02/24 2154   sodium chloride  flush (NS) 0.9 % injection 10-40 mL  10-40 mL Intracatheter Q12H Cox, Amy N, DO   10 mL at 04/03/24 0909   sodium chloride  flush (NS) 0.9 % injection 10-40 mL  10-40 mL Intracatheter PRN Cox, Amy N, DO       Facility-Administered Medications Ordered in Other Encounters  Medication Dose Route Frequency Provider Last Rate Last Admin   heparin  lock flush 100 unit/mL  500 Units Intravenous Once Tyran Huser J, MD       heparin  lock flush 100 unit/mL  500 Units Intracatheter PRN Adrian Alba, Deadra Everts, MD       heparin  lock flush 100 unit/mL  500 Units Intravenous Once Meryn Sarracino J, MD       heparin  lock flush 100 unit/mL  500 Units Intravenous Once Clarion Mooneyhan J, MD       sodium chloride  flush (NS) 0.9 % injection 10 mL  10 mL Intravenous PRN Sydne Krahl J, MD   10 mL at 12/04/17 0901   sodium chloride  flush (NS) 0.9 % injection 10 mL  10 mL Intracatheter PRN Shellie Dials, MD       sodium chloride  flush (NS) 0.9 % injection 10 mL  10 mL Intravenous PRN Shellie Dials, MD   10 mL at 04/24/21 0839   yttrium-90 injection 22.3 millicurie  22.3 millicurie Intravenous Once Glenis Langdon, MD        OBJECTIVE: Vitals:   04/03/24 0730 04/03/24 1114  BP: (!) 94/47 (!) 105/51  Pulse: 82 76  Resp: 18 16  Temp: (!) 97.4 F (36.3 C) 99.4 F (37.4 C)  SpO2: 90% 93%     Body mass index is 16.82 kg/m.    ECOG FS:4 - Bedbound  General: No acute distress. HEENT: Normocephalic, moist mucous membranes. Lungs: No audible wheezing or coughing. Heart: Regular rate and rhythm. Abdomen: Soft, nontender, no obvious distention. Musculoskeletal: No edema, cyanosis, or clubbing. Neuro:  Lethargic.  LAB RESULTS:  Lab Results  Component Value Date   NA 135 04/03/2024   K 3.1 (L) 04/03/2024   CL 103 04/03/2024   CO2 26 04/03/2024   GLUCOSE 110 (H) 04/03/2024   BUN 7 (L) 04/03/2024   CREATININE 0.67 04/03/2024   CALCIUM  7.5 (L) 04/03/2024   PROT 5.9 (L) 03/31/2024   ALBUMIN 3.0 (L) 03/31/2024   AST 17 03/31/2024   ALT 14 03/31/2024   ALKPHOS 75 03/31/2024   BILITOT 0.8 03/31/2024   GFRNONAA >60 04/03/2024   GFRAA >60 06/28/2020    Lab Results  Component Value Date   WBC 4.5 04/03/2024   NEUTROABS 9.8 (H) 03/31/2024   HGB 7.5 (L) 04/03/2024   HCT 25.0 (L) 04/03/2024   MCV 95.1 04/03/2024   PLT 150 04/03/2024     STUDIES: CT Angio Chest PE W/Cm &/Or Wo Cm Result Date: 03/31/2024 CLINICAL DATA:  Metastatic rectal cancer, lymphoma presenting with hypotension, increasing weakness, and shortness of breath on exertion EXAM: CT ANGIOGRAPHY CHEST WITH CONTRAST TECHNIQUE: Multidetector CT imaging of the chest was performed using the standard protocol during bolus administration of intravenous contrast. Multiplanar CT image reconstructions and MIPs were obtained to evaluate the vascular anatomy. RADIATION DOSE REDUCTION: This exam was performed according to the departmental dose-optimization program which includes automated exposure control, adjustment of the mA and/or kV according to patient size and/or use of iterative reconstruction technique. CONTRAST:  75mL OMNIPAQUE  IOHEXOL  350 MG/ML SOLN COMPARISON:  Radiograph 03/31/2024 and CT 02/12/2024 FINDINGS: Cardiovascular: No pericardial effusion. Normal caliber thoracic aorta without evidence of dissection. Coronary artery  and aortic atherosclerotic calcification. Negative for acute pulmonary embolism. Right chest wall Port-A-Cath tip at the superior cavoatrial junction. Mediastinum/Nodes: No mediastinal lymphadenopathy. Trachea and esophagus are unremarkable. Lungs/Pleura: No pleural effusion or pneumothorax. Slightly  increased size of the infrahilar mass in the right lower lobe measuring 4.8 x 5.2 cm, previously 4.8 x 5.0 cm. Obstruction of the right lower lobe bronchi with near complete atelectasis of the right lower lobe is not substantially changed from 02/12/2024. Numerous bilateral pulmonary nodules are redemonstrated. The largest on the left is in the left lower lobe and measures 2.9 x 4.0 cm on series 6/image 79, previously 2.9 x 3.7 cm. The largest on the right is in the anterior right upper lobe and measures 2.4 x 1.3 cm, similar to 02/12/2024. The previous ground-glass opacities have nearly resolved. Upper Abdomen: No acute abnormality. Musculoskeletal: No acute fracture or destructive osseous lesion. Review of the MIP images confirms the above findings. IMPRESSION: 1. Negative for acute pulmonary embolism. 2. Progression of disease with increased size of the right infrahilar mass and dominant left lower lobe nodule. 3. Aortic Atherosclerosis (ICD10-I70.0). Electronically Signed   By: Rozell Cornet M.D.   On: 03/31/2024 17:55   DG Chest Portable 1 View Result Date: 03/31/2024 CLINICAL DATA:  SOB EXAM: PORTABLE CHEST - 1 VIEW COMPARISON:  08/05/2023 FINDINGS: Increase in size and number bibasilar pulmonary nodules. Stable right IJ port catheter. Heart size and mediastinal contours are within normal limits. Aortic Atherosclerosis (ICD10-170.0). No effusion. Visualized bones unremarkable. IMPRESSION: Increase in size and number of bibasilar pulmonary nodules. Electronically Signed   By: Nicoletta Barrier M.D.   On: 03/31/2024 13:37    ASSESSMENT:  Progressive stage IV rectal cancer.  PLAN:    Rectal cancer: Patient last received chemotherapy approximately 10 days ago.  Patient has agreed to hospice care and is being discharged today to home.  No further treatments are planned.  No further follow-up is necessary in the cancer center.  Will follow.    Shellie Dials, MD   04/03/2024 3:10 PM

## 2024-04-03 NOTE — Progress Notes (Signed)
 AVS discharge instructions provided to pt and pt's daughter, at bedside. All questions answered at this time. All PIVs removed, sites WDL. Implanted port deaccessed by IV Team. All pt belongings taken with pt--pt verified. Pt transported to home with daughter via personal vehicle.

## 2024-04-03 NOTE — Plan of Care (Signed)
  Problem: Education: Goal: Knowledge of General Education information will improve Description: Including pain rating scale, medication(s)/side effects and non-pharmacologic comfort measures Outcome: Adequate for Discharge   Problem: Health Behavior/Discharge Planning: Goal: Ability to manage health-related needs will improve Outcome: Adequate for Discharge   Problem: Clinical Measurements: Goal: Ability to maintain clinical measurements within normal limits will improve Outcome: Adequate for Discharge Goal: Will remain free from infection Outcome: Adequate for Discharge Goal: Diagnostic test results will improve Outcome: Adequate for Discharge Goal: Respiratory complications will improve Outcome: Adequate for Discharge Goal: Cardiovascular complication will be avoided Outcome: Adequate for Discharge   Problem: Activity: Goal: Risk for activity intolerance will decrease Outcome: Adequate for Discharge   Problem: Nutrition: Goal: Adequate nutrition will be maintained Outcome: Adequate for Discharge   Problem: Coping: Goal: Level of anxiety will decrease Outcome: Adequate for Discharge   Problem: Elimination: Goal: Will not experience complications related to bowel motility Outcome: Adequate for Discharge Goal: Will not experience complications related to urinary retention Outcome: Adequate for Discharge   Problem: Pain Managment: Goal: General experience of comfort will improve and/or be controlled Outcome: Adequate for Discharge   Problem: Safety: Goal: Ability to remain free from injury will improve Outcome: Adequate for Discharge   Problem: Skin Integrity: Goal: Risk for impaired skin integrity will decrease Outcome: Adequate for Discharge   Problem: Malnutrition  (NI-5.2) Goal: Food and/or nutrient delivery Description: Individualized approach for food/nutrient provision. Outcome: Adequate for Discharge

## 2024-04-03 NOTE — Discharge Summary (Addendum)
 Physician Discharge Summary  Veronica Boyd YQI:[id number] DOB: 1946-07-05 DOA: 03/31/2024  PCP: Antonio Baumgarten, MD  Admit date: 03/31/2024 Discharge date: 04/03/2024  Admitted From: home  Disposition:  home w/ hospice  Recommendations for Outpatient Follow-up:  Follow up with hospice provider ASAP   Home Health: no  Equipment/Devices:  Discharge Condition: hospice  CODE STATUS:DNR Diet recommendation: regular   Brief/Interim Summary: HPI was taken from Dr. Reinhold Carbine: [name] is an 78 year old female with history of hyperlipidemia, atrial fibrillation on Eliquis , metastatic rectal cancer, follicular lymphoma, presents to the emergency department for chief concerns of hypotension.   Vitals in the ED showed temperature 98.1, respiration rate 24, heart rate 92, blood pressure 103/56, SpO2 99% on room air.   Serum sodium is 137, potassium 4.0, chloride 104, bicarb 23, BUN of 19, serum creatinine of 0.70, EGFR greater than 60, nonfasting blood glucose 87, WBC 11.7, hemoglobin 11.8, platelets of 172.   Hesitancy 268 on repeat is 23.   COVID/influenza A/influenza B/RSV PCR were negative.   Lactic acid was elevated at 3.5 and on repeat was 2.4.   BNP was elevated 606.8.    ED treatment: LR 3 L bolus, metronidazole , vancomycin , cefepime . ------------------------------ At bedside, patient was able to tell me her name, age, current location, current calendar year.   She reports the last time she urinated was yesterday. She reports poor appetite, po intake for months.    She endorses diarrhea, 2-3 days of diarrhea. She denies chest pain, abdominal pain, shortness of breath. She endorses dry cough that is baseline and nonproductive.    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 Pt and pt's family decided to proceed w/ hospice at home. Hospice following and recs apprec    Severe sepsis: see Dr. Diann Forth note on how pt met severe sepsis criteria. Urine cx is growing yeast. Blood cxs NGTD. No pneumonia seen on CXR. Abxs were d/c and started on fluconazole    Yeast infection: d/c abxs and start fluconazole    Diarrhea: likely secondary to recent chemo. GI PCR panel & c. diff are both neg. Continue on lomotil  but no improvement so far. Started on cholestyramine     Hypokalemia: potassium given    Chronic pain: likely secondary to cancer. Continue on home dose of norco    Hypothyroidism: continue on home dose of levothyroxine     PAF: continue on home dose of amio, eliquis      Severe protein calorie malnutrition: continue on oral supplements    HLD: continue on statin     Lymphoma: in remission as per pt. F/u w/ onco outpatient    Rectal Cancer: stage IV. Received chemo approx 1 week ago. Palliative care recs apprec   Discharge Instructions  Discharge Instructions     Diet general   Complete by: As directed    Discharge instructions   Complete by: As directed    F/u w/ hospice provider as soon as possible   Increase activity slowly   Complete by: As directed  Allergies as of 04/03/2024   No Known Allergies      Medication List     STOP taking these medications    traMADol-acetaminophen  37.5-325 MG tablet Commonly known as: ULTRACET       TAKE these medications    amiodarone  200 MG tablet Commonly known as: PACERONE  Take 1 tablet (200 mg) by mouth twice daily for 2 weeks, then decrease to 1 tablet (200 mg) by mouth daily.   apixaban  2.5 MG Tabs  tablet Commonly known as: Eliquis  Take 1 tablet (2.5 mg total) by mouth 2 (two) times daily.   Arnuity Ellipta  100 MCG/ACT Aepb Generic drug: Fluticasone Furoate  Inhale 1 puff into the lungs daily.   chlorpheniramine-HYDROcodone  10-8 MG/5ML Commonly known as: TUSSIONEX Take 5 mLs by mouth every 12 (twelve) hours as needed for up to 14 days for cough.   diclofenac sodium 1 % Gel Commonly known as: VOLTAREN Apply 2 g topically 2 (two) times daily as needed.   diphenoxylate -atropine  2.5-0.025 MG tablet Commonly known as: LOMOTIL  TAKE 2 TABLETS BY MOUTH 4 (FOUR) TIMES DAILY AS NEEDED FOR DIARRHEA OR LOOSE STOOLS.   fluconazole 150 MG tablet Commonly known as: DIFLUCAN Take 1 tablet (150 mg total) by mouth daily for 1 day. Start taking on: Apr 04, 2024   gabapentin  300 MG capsule Commonly known as: NEURONTIN  TAKE 1 CAPSULE BY MOUTH TWICE A DAY   HYDROcodone -acetaminophen  5-325 MG tablet Commonly known as: NORCO/VICODIN Take 1 tablet by mouth every 6 (six) hours as needed for up to 7 days for moderate pain (pain score 4-6) or severe pain (pain score 7-10). What changed: reasons to take this   levothyroxine  25 MCG tablet Commonly known as: SYNTHROID  TAKE 1 TABLET BY MOUTH EVERY DAY BEFORE BREAKFAST   lidocaine -prilocaine  cream Commonly known as: EMLA  Apply 1 Application topically as needed. Apply to port 1 hour prior to use as needed   metoprolol  succinate 25 MG 24 hr tablet Commonly known as: TOPROL -XL Take 25 mg by mouth daily.   mirtazapine  15 MG tablet Commonly known as: Remeron  Take 1 tablet (15 mg total) by mouth at bedtime.   pantoprazole  20 MG tablet Commonly known as: PROTONIX  Take 1 tablet by mouth daily as needed for heartburn.   potassium chloride  SA 20 MEQ tablet Commonly known as: KLOR-CON  M TAKE 1 TABLET BY MOUTH TWICE A DAY   simvastatin  20 MG tablet Commonly known as: ZOCOR  Take 20 mg by mouth at bedtime.   Trelegy Ellipta  100-62.5-25 MCG/ACT  Aepb Generic drug: Fluticasone-Umeclidin-Vilant Inhale 1 Inhalation into the lungs daily in the afternoon.        No Known Allergies  Consultations: Palliative care  Hospice  onco   Procedures/Studies: CT Angio Chest PE W/Cm &/Or Wo Cm Result Date: 03/31/2024 CLINICAL DATA:  Metastatic rectal cancer, lymphoma presenting with hypotension, increasing weakness, and shortness of breath on exertion EXAM: CT ANGIOGRAPHY CHEST WITH CONTRAST TECHNIQUE: Multidetector CT imaging of the chest was performed using the standard protocol during bolus administration of intravenous contrast. Multiplanar CT image reconstructions and MIPs were obtained to evaluate the vascular anatomy. RADIATION DOSE REDUCTION: This exam was performed according to the departmental dose-optimization program which includes automated exposure control, adjustment of the mA and/or kV according to patient size and/or use of iterative reconstruction technique. CONTRAST:  75mL OMNIPAQUE  IOHEXOL  350 MG/ML SOLN COMPARISON:  Radiograph 03/31/2024 and CT 02/12/2024 FINDINGS: Cardiovascular: No pericardial effusion. Normal caliber thoracic aorta without evidence of dissection. Coronary artery and aortic atherosclerotic calcification.  Negative for acute pulmonary embolism. Right chest wall Port-A-Cath tip at the superior cavoatrial junction. Mediastinum/Nodes: No mediastinal lymphadenopathy. Trachea and esophagus are unremarkable. Lungs/Pleura: No pleural effusion or pneumothorax. Slightly increased size of the infrahilar mass in the right lower lobe measuring 4.8 x 5.2 cm, previously 4.8 x 5.0 cm. Obstruction of the right lower lobe bronchi with near complete atelectasis of the right lower lobe is not substantially changed from 02/12/2024. Numerous bilateral pulmonary nodules are redemonstrated. The largest on the left is in the left lower lobe and measures 2.9 x 4.0 cm on series 6/image 79, previously 2.9 x 3.7 cm. The largest on the right is in  the anterior right upper lobe and measures 2.4 x 1.3 cm, similar to 02/12/2024. The previous ground-glass opacities have nearly resolved. Upper Abdomen: No acute abnormality. Musculoskeletal: No acute fracture or destructive osseous lesion. Review of the MIP images confirms the above findings. IMPRESSION: 1. Negative for acute pulmonary embolism. 2. Progression of disease with increased size of the right infrahilar mass and dominant left lower lobe nodule. 3. Aortic Atherosclerosis (ICD10-I70.0). Electronically Signed   By: Rozell Cornet M.D.   On: 03/31/2024 17:55   DG Chest Portable 1 View Result Date: 03/31/2024 CLINICAL DATA:  SOB EXAM: PORTABLE CHEST - 1 VIEW COMPARISON:  08/05/2023 FINDINGS: Increase in size and number bibasilar pulmonary nodules. Stable right IJ port catheter. Heart size and mediastinal contours are within normal limits. Aortic Atherosclerosis (ICD10-170.0). No effusion. Visualized bones unremarkable. IMPRESSION: Increase in size and number of bibasilar pulmonary nodules. Electronically Signed   By: Nicoletta Barrier M.D.   On: 03/31/2024 13:37   (Echo, Carotid, EGD, Colonoscopy, ERCP)    Subjective: Pt c/o malaise   Discharge Exam: Vitals:   04/03/24 0730 04/03/24 1114  BP: (!) 94/47 (!) 105/51  Pulse: 82 76  Resp: 18 16  Temp: (!) 97.4 F (36.3 C) 99.4 F (37.4 C)  SpO2: 90% 93%   Vitals:   04/02/24 2318 04/03/24 0413 04/03/24 0730 04/03/24 1114  BP: (!) 95/46 (!) 102/46 (!) 94/47 (!) 105/51  Pulse: 78 86 82 76  Resp: 18 18 18 16   Temp: 98 F (36.7 C) 98.8 F (37.1 C) (!) 97.4 F (36.3 C) 99.4 F (37.4 C)  TempSrc:   Oral Oral  SpO2: 94% 91% 90% 93%  Weight:      Height:        General: Pt is alert, awake, not in acute distress. Cachetic appearing Cardiovascular: S1/S2 +, no rubs, no gallops Respiratory: CTA bilaterally, no wheezing, no rhonchi Abdominal: Soft, NT, ND, bowel sounds + Extremities:  no cyanosis    The results of significant diagnostics  from this hospitalization (including imaging, microbiology, ancillary and laboratory) are listed below for reference.     Microbiology: Recent Results (from the past 240 hours)  Blood culture (routine x 2)     Status: None (Preliminary result)   Collection Time: 03/31/24 11:28 AM   Specimen: BLOOD  Result Value Ref Range Status   Specimen Description BLOOD LEFT ANTECUBITAL  Final   Special Requests   Final    BOTTLES DRAWN AEROBIC AND ANAEROBIC Blood Culture results may not be optimal due to an inadequate volume of blood received in culture bottles   Culture   Final    NO GROWTH 3 DAYS Performed at [facility], [address] [place], [place] [address]    Report Status PENDING  Incomplete  Resp panel by RT-PCR (RSV, Flu A&B, Covid) Anterior  Nasal Swab     Status: None   Collection Time: 03/31/24 11:57 AM   Specimen: Anterior Nasal Swab  Result Value Ref Range Status   SARS Coronavirus 2 by RT PCR NEGATIVE NEGATIVE Final    Comment: (NOTE) SARS-CoV-2 target nucleic acids are NOT DETECTED.  The SARS-CoV-2 RNA is generally detectable in upper respiratory specimens during the acute phase of infection. The lowest concentration of SARS-CoV-2 viral copies this assay can detect is 138 copies/mL. A negative result does not preclude SARS-Cov-2 infection and should not be used as the sole basis for treatment or other patient management decisions. A negative result may occur with  improper specimen collection/handling, submission of specimen other than nasopharyngeal swab, presence of viral mutation(s) within the areas targeted by this assay, and inadequate number of viral copies(<138 copies/mL). A negative result must be combined with clinical observations, patient history, and epidemiological information. The expected result is Negative.  Fact Sheet for Patients:  BloggerCourse.com  Fact Sheet for Healthcare Providers:   SeriousBroker.it  This test is no t yet approved or cleared by the [place]  FDA and  has been authorized for detection and/or diagnosis of SARS-CoV-2 by FDA under an Emergency Use Authorization (EUA). This EUA will remain  in effect (meaning this test can be used) for the duration of the COVID-19 declaration under Section 564(b)(1) of the Act, 21 U.S.C.section 360bbb-3(b)(1), unless the authorization is terminated  or revoked sooner.       Influenza A by PCR NEGATIVE NEGATIVE Final   Influenza B by PCR NEGATIVE NEGATIVE Final    Comment: (NOTE) The Xpert Xpress SARS-CoV-2/FLU/RSV plus assay is intended as an aid in the diagnosis of influenza from Nasopharyngeal swab specimens and should not be used as a sole basis for treatment. Nasal washings and aspirates are unacceptable for Xpert Xpress SARS-CoV-2/FLU/RSV testing.  Fact Sheet for Patients: BloggerCourse.com  Fact Sheet for Healthcare Providers: SeriousBroker.it  This test is not yet approved or cleared by the [place]  FDA and has been authorized for detection and/or diagnosis of SARS-CoV-2 by FDA under an Emergency Use Authorization (EUA). This EUA will remain in effect (meaning this test can be used) for the duration of the COVID-19 declaration under Section 564(b)(1) of the Act, 21 U.S.C. section 360bbb-3(b)(1), unless the authorization is terminated or revoked.     Resp Syncytial Virus by PCR NEGATIVE NEGATIVE Final    Comment: (NOTE) Fact Sheet for Patients: BloggerCourse.com  Fact Sheet for Healthcare Providers: SeriousBroker.it  This test is not yet approved or cleared by the [place]  FDA and has been authorized for detection and/or diagnosis of SARS-CoV-2 by FDA under an Emergency Use Authorization (EUA). This EUA will remain in effect (meaning this test can be used) for  the duration of the COVID-19 declaration under Section 564(b)(1) of the Act, 21 U.S.C. section 360bbb-3(b)(1), unless the authorization is terminated or revoked.  Performed at [facility], [address] [place], [place] [address]   Blood culture (routine x 2)     Status: None (Preliminary result)   Collection Time: 03/31/24  1:29 PM   Specimen: BLOOD  Result Value Ref Range Status   Specimen Description BLOOD BLOOD RIGHT WRIST  Final   Special Requests   Final    BOTTLES DRAWN AEROBIC ONLY Blood Culture results may not be optimal due to an inadequate volume of blood received in culture bottles   Culture   Final    NO GROWTH 3 DAYS Performed at [facility], [address] [place], [place] [address]    Report Status PENDING  Incomplete  Gastrointestinal Panel by PCR , Stool     Status: None   Collection Time: 03/31/24  9:31 PM   Specimen: Stool  Result Value Ref Range Status   Campylobacter species NOT DETECTED NOT DETECTED Final   Plesimonas shigelloides NOT DETECTED NOT DETECTED Final   Salmonella species NOT DETECTED NOT DETECTED Final   Yersinia enterocolitica NOT DETECTED NOT DETECTED Final   Vibrio species NOT DETECTED NOT DETECTED Final   Vibrio cholerae NOT DETECTED NOT DETECTED Final   Enteroaggregative E coli (EAEC) NOT DETECTED NOT DETECTED Final   Enteropathogenic E coli (EPEC) NOT DETECTED NOT DETECTED Final   Enterotoxigenic E coli (ETEC) NOT DETECTED NOT DETECTED Final   Shiga like toxin producing E coli (STEC) NOT DETECTED NOT DETECTED Final   Shigella/Enteroinvasive E coli (EIEC) NOT DETECTED NOT DETECTED Final   Cryptosporidium NOT DETECTED NOT DETECTED Final   Cyclospora cayetanensis NOT DETECTED NOT DETECTED Final   Entamoeba histolytica NOT DETECTED NOT DETECTED Final   Giardia lamblia NOT DETECTED NOT DETECTED Final   Adenovirus F40/41 NOT DETECTED NOT DETECTED Final   Astrovirus NOT DETECTED NOT DETECTED Final   Norovirus GI/GII  NOT DETECTED NOT DETECTED Final   Rotavirus A NOT DETECTED NOT DETECTED Final   Sapovirus (I, II, IV, and V) NOT DETECTED NOT DETECTED Final    Comment: Performed at [facility], [address] [place], [place] [address]  C Difficile Quick Screen w PCR reflex     Status: None   Collection Time: 03/31/24  9:31 PM   Specimen: STOOL  Result Value Ref Range Status   C Diff antigen NEGATIVE NEGATIVE Final   C Diff toxin NEGATIVE NEGATIVE Final   C Diff interpretation No C. difficile detected.  Final    Comment: Performed at [facility], [address] [place], [place] [address]  MRSA Next Gen by PCR, Nasal     Status: None   Collection Time: 03/31/24  9:34 PM   Specimen: STOOL; Nasal Swab  Result Value Ref Range Status   MRSA by PCR Next Gen NOT DETECTED NOT DETECTED Final    Comment: (NOTE) The GeneXpert MRSA Assay (FDA approved for NASAL specimens only), is one component of a comprehensive MRSA colonization surveillance program. It is not intended to diagnose MRSA infection nor to guide or monitor treatment for MRSA infections. Test performance is not FDA approved in patients less than 1 years old. Performed at [facility], [address] [place], [place] [address]   Urine Culture     Status: Abnormal   Collection Time: 04/01/24  4:00 PM   Specimen: Urine, Clean Catch  Result Value Ref Range Status   Specimen Description   Final    URINE, CLEAN CATCH Performed at [facility], [address] [place], [place] [address]    Special Requests   Final    NONE Reflexed from [phone] Performed at [facility], [address] [place], [place] [address]    Culture 40,000 COLONIES/mL YEAST (A)  Final   Report Status 04/02/2024 FINAL  Final     Labs: BNP (last 3 results) Recent Labs    03/31/24 1139  BNP 606.8*   Basic Metabolic Panel: Recent Labs  Lab 03/31/24 1329 04/01/24 0248 04/02/24 0432 04/03/24 0830  NA 137 136 139  135  K 4.0 3.2* 3.2* 3.1*  CL 104 104  109 103  CO2 23 22 25 26   GLUCOSE 87 80 82 110*  BUN 19 14 9  7*  CREATININE 0.70 0.60 0.70 0.67  CALCIUM  8.6* 7.9* 7.7* 7.5*  MG  --   --  1.6*  --    Liver Function Tests: Recent Labs  Lab 03/31/24 1329  AST 17  ALT 14  ALKPHOS 75  BILITOT 0.8  PROT 5.9*  ALBUMIN 3.0*   No results for input(s): "LIPASE", "AMYLASE" in the last 168 hours. No results for input(s): "AMMONIA" in the last 168 hours. CBC: Recent Labs  Lab 03/31/24 1128 04/01/24 0248 04/02/24 0432 04/03/24 0830  WBC 11.7* 8.3 6.0 4.5  NEUTROABS 9.8*  --   --   --   HGB 11.8* 8.6* 7.6* 7.5*  HCT 37.5 27.6* 23.5* 25.0*  MCV 93.8 92.9 91.4 95.1  PLT 172 151 141* 150   Cardiac Enzymes: No results for input(s): "CKTOTAL", "CKMB", "CKMBINDEX", "TROPONINI" in the last 168 hours. BNP: Invalid input(s): "POCBNP" CBG: No results for input(s): "GLUCAP" in the last 168 hours. D-Dimer No results for input(s): "DDIMER" in the last 72 hours. Hgb A1c No results for input(s): "HGBA1C" in the last 72 hours. Lipid Profile No results for input(s): "CHOL", "HDL", "LDLCALC", "TRIG", "CHOLHDL", "LDLDIRECT" in the last 72 hours. Thyroid  function studies No results for input(s): "TSH", "T4TOTAL", "T3FREE", "THYROIDAB" in the last 72 hours.  Invalid input(s): "FREET3" Anemia work up No results for input(s): "VITAMINB12", "FOLATE", "FERRITIN", "TIBC", "IRON", "RETICCTPCT" in the last 72 hours. Urinalysis    Component Value Date/Time   COLORURINE AMBER (A) 04/01/2024 1600   APPEARANCEUR CLOUDY (A) 04/01/2024 1600   APPEARANCEUR Clear 06/16/2020 1444   LABSPEC 1.015 04/01/2024 1600   PHURINE 5.0 04/01/2024 1600   GLUCOSEU NEGATIVE 04/01/2024 1600   HGBUR MODERATE (A) 04/01/2024 1600   BILIRUBINUR NEGATIVE 04/01/2024 1600   BILIRUBINUR Negative 06/16/2020 1444   KETONESUR 5 (A) 04/01/2024 1600   PROTEINUR NEGATIVE 04/01/2024 1600   NITRITE NEGATIVE 04/01/2024 1600   LEUKOCYTESUR  LARGE (A) 04/01/2024 1600   Sepsis Labs Recent Labs  Lab 03/31/24 1128 04/01/24 0248 04/02/24 0432 04/03/24 0830  WBC 11.7* 8.3 6.0 4.5   Microbiology Recent Results (from the past 240 hours)  Blood culture (routine x 2)     Status: None (Preliminary result)   Collection Time: 03/31/24 11:28 AM   Specimen: BLOOD  Result Value Ref Range Status   Specimen Description BLOOD LEFT ANTECUBITAL  Final   Special Requests   Final    BOTTLES DRAWN AEROBIC AND ANAEROBIC Blood Culture results may not be optimal due to an inadequate volume of blood received in culture bottles   Culture   Final    NO GROWTH 3 DAYS Performed at [facility], [address] [place], [place] [address]    Report Status PENDING  Incomplete  Resp panel by RT-PCR (RSV, Flu A&B, Covid) Anterior Nasal Swab     Status: None   Collection Time: 03/31/24 11:57 AM   Specimen: Anterior Nasal Swab  Result Value Ref Range Status   SARS Coronavirus 2 by RT PCR NEGATIVE NEGATIVE Final    Comment: (NOTE) SARS-CoV-2 target nucleic acids are NOT DETECTED.  The SARS-CoV-2 RNA is generally detectable in upper respiratory specimens during the acute phase of infection. The lowest concentration of SARS-CoV-2 viral copies this assay can detect is 138 copies/mL. A negative result does not preclude SARS-Cov-2 infection and should not be used as the sole basis for treatment  or other patient management decisions. A negative result may occur with  improper specimen collection/handling, submission of specimen other than nasopharyngeal swab, presence of viral mutation(s) within the areas targeted by this assay, and inadequate number of viral copies(<138 copies/mL). A negative result must be combined with clinical observations, patient history, and epidemiological information. The expected result is Negative.  Fact Sheet for Patients:  BloggerCourse.com  Fact Sheet for Healthcare Providers:   SeriousBroker.it  This test is no t yet approved or cleared by the [place]  FDA and  has been authorized for detection and/or diagnosis of SARS-CoV-2 by FDA under an Emergency Use Authorization (EUA). This EUA will remain  in effect (meaning this test can be used) for the duration of the COVID-19 declaration under Section 564(b)(1) of the Act, 21 U.S.C.section 360bbb-3(b)(1), unless the authorization is terminated  or revoked sooner.       Influenza A by PCR NEGATIVE NEGATIVE Final   Influenza B by PCR NEGATIVE NEGATIVE Final    Comment: (NOTE) The Xpert Xpress SARS-CoV-2/FLU/RSV plus assay is intended as an aid in the diagnosis of influenza from Nasopharyngeal swab specimens and should not be used as a sole basis for treatment. Nasal washings and aspirates are unacceptable for Xpert Xpress SARS-CoV-2/FLU/RSV testing.  Fact Sheet for Patients: BloggerCourse.com  Fact Sheet for Healthcare Providers: SeriousBroker.it  This test is not yet approved or cleared by the [place]  FDA and has been authorized for detection and/or diagnosis of SARS-CoV-2 by FDA under an Emergency Use Authorization (EUA). This EUA will remain in effect (meaning this test can be used) for the duration of the COVID-19 declaration under Section 564(b)(1) of the Act, 21 U.S.C. section 360bbb-3(b)(1), unless the authorization is terminated or revoked.     Resp Syncytial Virus by PCR NEGATIVE NEGATIVE Final    Comment: (NOTE) Fact Sheet for Patients: BloggerCourse.com  Fact Sheet for Healthcare Providers: SeriousBroker.it  This test is not yet approved or cleared by the [place]  FDA and has been authorized for detection and/or diagnosis of SARS-CoV-2 by FDA under an Emergency Use Authorization (EUA). This EUA will remain in effect (meaning this test can be used) for  the duration of the COVID-19 declaration under Section 564(b)(1) of the Act, 21 U.S.C. section 360bbb-3(b)(1), unless the authorization is terminated or revoked.  Performed at [facility], [address] [place], [place] [address]   Blood culture (routine x 2)     Status: None (Preliminary result)   Collection Time: 03/31/24  1:29 PM   Specimen: BLOOD  Result Value Ref Range Status   Specimen Description BLOOD BLOOD RIGHT WRIST  Final   Special Requests   Final    BOTTLES DRAWN AEROBIC ONLY Blood Culture results may not be optimal due to an inadequate volume of blood received in culture bottles   Culture   Final    NO GROWTH 3 DAYS Performed at [facility], [address], [place], [place] [address]    Report Status PENDING  Incomplete  Gastrointestinal Panel by PCR , Stool     Status: None   Collection Time: 03/31/24  9:31 PM   Specimen: Stool  Result Value Ref Range Status   Campylobacter species NOT DETECTED NOT DETECTED Final   Plesimonas shigelloides NOT DETECTED NOT DETECTED Final   Salmonella species NOT DETECTED NOT DETECTED Final   Yersinia enterocolitica NOT DETECTED NOT DETECTED Final   Vibrio species NOT DETECTED NOT DETECTED Final   Vibrio cholerae NOT DETECTED NOT DETECTED  Final   Enteroaggregative E coli (EAEC) NOT DETECTED NOT DETECTED Final   Enteropathogenic E coli (EPEC) NOT DETECTED NOT DETECTED Final   Enterotoxigenic E coli (ETEC) NOT DETECTED NOT DETECTED Final   Shiga like toxin producing E coli (STEC) NOT DETECTED NOT DETECTED Final   Shigella/Enteroinvasive E coli (EIEC) NOT DETECTED NOT DETECTED Final   Cryptosporidium NOT DETECTED NOT DETECTED Final   Cyclospora cayetanensis NOT DETECTED NOT DETECTED Final   Entamoeba histolytica NOT DETECTED NOT DETECTED Final   Giardia lamblia NOT DETECTED NOT DETECTED Final   Adenovirus F40/41 NOT DETECTED NOT DETECTED Final   Astrovirus NOT DETECTED NOT DETECTED Final   Norovirus GI/GII  NOT DETECTED NOT DETECTED Final   Rotavirus A NOT DETECTED NOT DETECTED Final   Sapovirus (I, II, IV, and V) NOT DETECTED NOT DETECTED Final    Comment: Performed at [facility], [address] [place], [place] [address]  C Difficile Quick Screen w PCR reflex     Status: None   Collection Time: 03/31/24  9:31 PM   Specimen: STOOL  Result Value Ref Range Status   C Diff antigen NEGATIVE NEGATIVE Final   C Diff toxin NEGATIVE NEGATIVE Final   C Diff interpretation No C. difficile detected.  Final    Comment: Performed at [facility], [address] [place], [place] [address]  MRSA Next Gen by PCR, Nasal     Status: None   Collection Time: 03/31/24  9:34 PM   Specimen: STOOL; Nasal Swab  Result Value Ref Range Status   MRSA by PCR Next Gen NOT DETECTED NOT DETECTED Final    Comment: (NOTE) The GeneXpert MRSA Assay (FDA approved for NASAL specimens only), is one component of a comprehensive MRSA colonization surveillance program. It is not intended to diagnose MRSA infection nor to guide or monitor treatment for MRSA infections. Test performance is not FDA approved in patients less than 42 years old. Performed at [facility], [address] [place], [place] [address]   Urine Culture     Status: Abnormal   Collection Time: 04/01/24  4:00 PM   Specimen: Urine, Clean Catch  Result Value Ref Range Status   Specimen Description   Final    URINE, CLEAN CATCH Performed at [facility], [address] [place], [place] [address]    Special Requests   Final    NONE Reflexed from [phone] Performed at [facility], [address] [place], [place] [address]    Culture 40,000 COLONIES/mL YEAST (A)  Final   Report Status 04/02/2024 FINAL  Final     Time coordinating discharge: Over 30 minutes  SIGNED:   Alphonsus Jeans, MD  [facility] 04/03/2024, 12:50 PM Pager   If 7PM-7AM, please contact  night-coverage [email or web address]

## 2024-04-03 NOTE — TOC Transition Note (Signed)
 Transition of Care [facility]) - Discharge Note   Patient Details  Name: Veronica Boyd MRN: [id number] Date of Birth: [birth year]  Transition of Care [facility] CM/SW Contact:  Marino Sias, RN Phone Number: 04/03/2024, 1:05 PM   Final next level of care: Home w Hospice Care Barriers to Discharge: Barriers Resolved   Patient Goals and CMS Choice Patient states their goals for this hospitalization and ongoing recovery are:: Home Hospice CMS Medicare.gov Compare Post Acute Care list provided to:: Patient Represenative (must comment) (Daughter [name])        Discharge Placement                  Name of family member notified: Daughter Patient and family notified of of transfer: 04/02/24  Discharge Plan and Services Additional resources added to the After Visit Summary for                  DME Arranged: Hospice Equipment Package B DME Agency: Other - Comment ([facility] arranged)       HH Arranged: NA HH Agency: [facility] Date HH Agency Contacted: 04/02/24   Representative spoke with at [facility] Agency: Girard Lam  Social Drivers of Health (SDOH) Interventions SDOH Screenings   Food Insecurity: No Food Insecurity (03/31/2024)  Housing: Low Risk  (03/31/2024)  Transportation Needs: No Transportation Needs (03/31/2024)  Utilities: Not At Risk (03/31/2024)  Financial Resource Strain: Low Risk  (07/30/2023)   Received from [facility]  Social Connections: Moderately Isolated (03/31/2024)  Tobacco Use: Low Risk  (03/31/2024)     Readmission Risk Interventions     No data to display

## 2024-04-03 NOTE — Plan of Care (Signed)

## 2024-04-04 DIAGNOSIS — C8208 Follicular lymphoma grade I, lymph nodes of multiple sites: Secondary | ICD-10-CM

## 2024-04-04 DIAGNOSIS — F4320 Adjustment disorder, unspecified: Secondary | ICD-10-CM

## 2024-04-05 LAB — CULTURE, BLOOD (ROUTINE X 2)
Culture: NO GROWTH
Culture: NO GROWTH

## 2024-04-06 DIAGNOSIS — F4320 Adjustment disorder, unspecified: Secondary | ICD-10-CM | POA: Insufficient documentation

## 2024-04-08 ENCOUNTER — Other Ambulatory Visit: Payer: Self-pay | Admitting: Oncology

## 2024-04-08 ENCOUNTER — Ambulatory Visit: Admitting: Oncology

## 2024-04-08 ENCOUNTER — Ambulatory Visit

## 2024-04-08 ENCOUNTER — Other Ambulatory Visit

## 2024-04-09 ENCOUNTER — Other Ambulatory Visit: Payer: Self-pay | Admitting: Oncology

## 2024-04-10 ENCOUNTER — Ambulatory Visit

## 2024-04-15 ENCOUNTER — Ambulatory Visit

## 2024-04-15 ENCOUNTER — Other Ambulatory Visit

## 2024-04-15 ENCOUNTER — Ambulatory Visit: Admitting: Oncology

## 2024-04-15 ENCOUNTER — Inpatient Hospital Stay

## 2024-04-17 ENCOUNTER — Ambulatory Visit

## 2024-04-21 ENCOUNTER — Ambulatory Visit: Admitting: Pulmonary Disease

## 2024-05-05 ENCOUNTER — Ambulatory Visit: Admitting: Oncology

## 2024-05-05 ENCOUNTER — Ambulatory Visit

## 2024-05-05 ENCOUNTER — Other Ambulatory Visit

## 2024-05-06 ENCOUNTER — Other Ambulatory Visit

## 2024-05-06 ENCOUNTER — Ambulatory Visit: Admitting: Oncology

## 2024-05-06 ENCOUNTER — Ambulatory Visit

## 2024-05-07 ENCOUNTER — Ambulatory Visit

## 2024-07-06 DEATH — deceased

## 2024-08-27 NOTE — Progress Notes (Signed)
 Discharge Plan Member has been discharged from [facility]. Please see below Discharge Plan for details. If you have any questions, please contact our Clinical Team at [phone]. Name: Veronica Boyd Date of Birth: [birth year] Discharge Date: 2024-08-27 Discharged Reason: No longer eligible: Deceased Referring Provider: Dr. Jacobo Discharge Summary: The Member is deceased [place] Care Provider: Kedric Severin - Health Coach
